# Patient Record
Sex: Female | Born: 1945
Health system: Southern US, Community
[De-identification: ages and names within clinical notes are randomized; demographics above are authoritative.]

## PROBLEM LIST (undated history)

## (undated) DIAGNOSIS — H35033 Hypertensive retinopathy, bilateral: Secondary | ICD-10-CM

## (undated) DIAGNOSIS — G8929 Other chronic pain: Secondary | ICD-10-CM

## (undated) DIAGNOSIS — I499 Cardiac arrhythmia, unspecified: Secondary | ICD-10-CM

## (undated) DIAGNOSIS — J189 Pneumonia, unspecified organism: Secondary | ICD-10-CM

## (undated) DIAGNOSIS — F329 Major depressive disorder, single episode, unspecified: Secondary | ICD-10-CM

## (undated) DIAGNOSIS — Z9889 Other specified postprocedural states: Secondary | ICD-10-CM

## (undated) DIAGNOSIS — K648 Other hemorrhoids: Secondary | ICD-10-CM

## (undated) DIAGNOSIS — K589 Irritable bowel syndrome without diarrhea: Secondary | ICD-10-CM

## (undated) DIAGNOSIS — D839 Common variable immunodeficiency, unspecified: Secondary | ICD-10-CM

## (undated) DIAGNOSIS — R112 Nausea with vomiting, unspecified: Secondary | ICD-10-CM

## (undated) DIAGNOSIS — I219 Acute myocardial infarction, unspecified: Secondary | ICD-10-CM

## (undated) DIAGNOSIS — Z85828 Personal history of other malignant neoplasm of skin: Secondary | ICD-10-CM

## (undated) DIAGNOSIS — M1990 Unspecified osteoarthritis, unspecified site: Secondary | ICD-10-CM

## (undated) DIAGNOSIS — J449 Chronic obstructive pulmonary disease, unspecified: Secondary | ICD-10-CM

## (undated) DIAGNOSIS — K219 Gastro-esophageal reflux disease without esophagitis: Secondary | ICD-10-CM

## (undated) DIAGNOSIS — I340 Nonrheumatic mitral (valve) insufficiency: Secondary | ICD-10-CM

## (undated) DIAGNOSIS — M549 Dorsalgia, unspecified: Secondary | ICD-10-CM

## (undated) DIAGNOSIS — R011 Cardiac murmur, unspecified: Secondary | ICD-10-CM

## (undated) DIAGNOSIS — D099 Carcinoma in situ, unspecified: Secondary | ICD-10-CM

## (undated) DIAGNOSIS — C4491 Basal cell carcinoma of skin, unspecified: Secondary | ICD-10-CM

## (undated) DIAGNOSIS — D049 Carcinoma in situ of skin, unspecified: Secondary | ICD-10-CM

## (undated) DIAGNOSIS — T7840XA Allergy, unspecified, initial encounter: Secondary | ICD-10-CM

## (undated) DIAGNOSIS — H269 Unspecified cataract: Secondary | ICD-10-CM

## (undated) DIAGNOSIS — C4499 Other specified malignant neoplasm of skin, unspecified: Secondary | ICD-10-CM

## (undated) DIAGNOSIS — J398 Other specified diseases of upper respiratory tract: Secondary | ICD-10-CM

## (undated) DIAGNOSIS — E785 Hyperlipidemia, unspecified: Secondary | ICD-10-CM

## (undated) DIAGNOSIS — F32A Depression, unspecified: Secondary | ICD-10-CM

## (undated) DIAGNOSIS — C4492 Squamous cell carcinoma of skin, unspecified: Secondary | ICD-10-CM

## (undated) DIAGNOSIS — Z5189 Encounter for other specified aftercare: Secondary | ICD-10-CM

## (undated) DIAGNOSIS — A439 Nocardiosis, unspecified: Secondary | ICD-10-CM

## (undated) DIAGNOSIS — G453 Amaurosis fugax: Secondary | ICD-10-CM

## (undated) DIAGNOSIS — M6530 Trigger finger, unspecified finger: Secondary | ICD-10-CM

## (undated) DIAGNOSIS — F419 Anxiety disorder, unspecified: Secondary | ICD-10-CM

## (undated) DIAGNOSIS — Z8679 Personal history of other diseases of the circulatory system: Secondary | ICD-10-CM

## (undated) DIAGNOSIS — I2510 Atherosclerotic heart disease of native coronary artery without angina pectoris: Secondary | ICD-10-CM

## (undated) DIAGNOSIS — K5730 Diverticulosis of large intestine without perforation or abscess without bleeding: Secondary | ICD-10-CM

## (undated) DIAGNOSIS — K52832 Lymphocytic colitis: Secondary | ICD-10-CM

## (undated) DIAGNOSIS — J479 Bronchiectasis, uncomplicated: Secondary | ICD-10-CM

## (undated) DIAGNOSIS — E039 Hypothyroidism, unspecified: Secondary | ICD-10-CM

## (undated) DIAGNOSIS — G4730 Sleep apnea, unspecified: Secondary | ICD-10-CM

## (undated) DIAGNOSIS — G43109 Migraine with aura, not intractable, without status migrainosus: Secondary | ICD-10-CM

## (undated) HISTORY — PX: UPPER GASTROINTESTINAL ENDOSCOPY: SHX188

## (undated) HISTORY — DX: Unspecified osteoarthritis, unspecified site: M19.90

## (undated) HISTORY — DX: Hyperlipidemia, unspecified: E78.5

## (undated) HISTORY — DX: Allergy, unspecified, initial encounter: T78.40XA

## (undated) HISTORY — DX: Unspecified cataract: H26.9

## (undated) HISTORY — DX: Personal history of other diseases of the circulatory system: Z86.79

## (undated) HISTORY — PX: BUNIONECTOMY: SHX129

## (undated) HISTORY — PX: HIP SURGERY: SHX245

## (undated) HISTORY — PX: CARDIAC CATHETERIZATION: SHX172

## (undated) HISTORY — DX: Other hemorrhoids: K64.8

## (undated) HISTORY — PX: JOINT REPLACEMENT: SHX530

## (undated) HISTORY — DX: Hypothyroidism, unspecified: E03.9

## (undated) HISTORY — DX: Gastro-esophageal reflux disease without esophagitis: K21.9

## (undated) HISTORY — DX: Migraine with aura, not intractable, without status migrainosus: G43.109

## (undated) HISTORY — DX: Diverticulosis of large intestine without perforation or abscess without bleeding: K57.30

## (undated) HISTORY — PX: TONSILLECTOMY: SUR1361

## (undated) HISTORY — DX: Bronchiectasis, uncomplicated: J47.9

## (undated) HISTORY — PX: WISDOM TOOTH EXTRACTION: SHX21

## (undated) HISTORY — DX: Depression, unspecified: F32.A

## (undated) HISTORY — DX: Trigger finger, unspecified finger: M65.30

## (undated) HISTORY — DX: Cardiac murmur, unspecified: R01.1

## (undated) HISTORY — DX: Nocardiosis, unspecified: A43.9

## (undated) HISTORY — DX: Basal cell carcinoma of skin, unspecified: C44.91

## (undated) HISTORY — DX: Nonrheumatic mitral (valve) insufficiency: I34.0

## (undated) HISTORY — DX: Chronic obstructive pulmonary disease, unspecified: J44.9

## (undated) HISTORY — DX: Other chronic pain: G89.29

## (undated) HISTORY — DX: Personal history of other malignant neoplasm of skin: Z85.828

## (undated) HISTORY — DX: Irritable bowel syndrome, unspecified: K58.9

## (undated) HISTORY — DX: Encounter for other specified aftercare: Z51.89

## (undated) HISTORY — PX: ABDOMINAL HYSTERECTOMY: SHX81

## (undated) HISTORY — PX: SPINE SURGERY: SHX786

## (undated) HISTORY — PX: CATARACT EXTRACTION: SUR2

## (undated) HISTORY — DX: Acute myocardial infarction, unspecified: I21.9

## (undated) HISTORY — DX: Hypertensive retinopathy, bilateral: H35.033

## (undated) HISTORY — DX: Lymphocytic colitis: K52.832

## (undated) HISTORY — PX: EYE SURGERY: SHX253

## (undated) HISTORY — DX: Dorsalgia, unspecified: M54.9

## (undated) HISTORY — DX: Amaurosis fugax: G45.3

## (undated) HISTORY — DX: Major depressive disorder, single episode, unspecified: F32.9

## (undated) HISTORY — PX: TUBAL LIGATION: SHX77

## (undated) HISTORY — PX: COSMETIC SURGERY: SHX468

## (undated) HISTORY — DX: Other specified diseases of upper respiratory tract: J39.8

## (undated) MED FILL — Medication: Fill #0 | Status: CN

---
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
## 1976-02-17 HISTORY — PX: TROCHANTERIC BURSA EXCISION: SHX2581

## 1977-07-17 DIAGNOSIS — C4490 Unspecified malignant neoplasm of skin, unspecified: Secondary | ICD-10-CM | POA: Insufficient documentation

## 1984-02-17 HISTORY — PX: TUBAL LIGATION: SHX77

## 1987-02-17 HISTORY — PX: NECK SURGERY: SHX720

## 1987-02-17 HISTORY — PX: CARPAL TUNNEL RELEASE: SHX101

## 1990-02-16 HISTORY — PX: CHOLECYSTECTOMY: SHX55

## 1992-05-20 DIAGNOSIS — C4491 Basal cell carcinoma of skin, unspecified: Secondary | ICD-10-CM

## 1992-05-20 HISTORY — DX: Basal cell carcinoma of skin, unspecified: C44.91

## 1994-10-07 DIAGNOSIS — D049 Carcinoma in situ of skin, unspecified: Secondary | ICD-10-CM

## 1994-10-07 HISTORY — DX: Carcinoma in situ of skin, unspecified: D04.9

## 1995-02-17 HISTORY — PX: TOTAL ABDOMINAL HYSTERECTOMY W/ BILATERAL SALPINGOOPHORECTOMY: SHX83

## 1995-02-17 LAB — HM PAP SMEAR

## 1996-02-17 DIAGNOSIS — K5730 Diverticulosis of large intestine without perforation or abscess without bleeding: Secondary | ICD-10-CM

## 1996-02-17 DIAGNOSIS — K648 Other hemorrhoids: Secondary | ICD-10-CM

## 1996-02-17 HISTORY — DX: Other hemorrhoids: K64.8

## 1996-02-17 HISTORY — PX: ABDOMINAL HYSTERECTOMY: SHX81

## 1996-02-17 HISTORY — DX: Diverticulosis of large intestine without perforation or abscess without bleeding: K57.30

## 1997-10-02 ENCOUNTER — Encounter: Admission: RE | Admit: 1997-10-02 | Discharge: 1997-12-31 | Payer: Self-pay | Admitting: Specialist

## 1998-02-16 HISTORY — PX: HIP SURGERY: SHX245

## 1998-06-03 ENCOUNTER — Ambulatory Visit (HOSPITAL_COMMUNITY): Admission: RE | Admit: 1998-06-03 | Discharge: 1998-06-03 | Payer: Self-pay | Admitting: *Deleted

## 1998-08-08 ENCOUNTER — Encounter: Payer: Self-pay | Admitting: Orthopedic Surgery

## 1998-08-08 ENCOUNTER — Ambulatory Visit (HOSPITAL_COMMUNITY): Admission: RE | Admit: 1998-08-08 | Discharge: 1998-08-08 | Payer: Self-pay | Admitting: Family Medicine

## 1998-08-08 ENCOUNTER — Encounter: Payer: Self-pay | Admitting: Family Medicine

## 1998-09-19 ENCOUNTER — Encounter: Payer: Self-pay | Admitting: Orthopedic Surgery

## 1998-09-20 ENCOUNTER — Encounter: Payer: Self-pay | Admitting: Orthopedic Surgery

## 1998-09-20 ENCOUNTER — Ambulatory Visit (HOSPITAL_COMMUNITY): Admission: RE | Admit: 1998-09-20 | Discharge: 1998-09-21 | Payer: Self-pay | Admitting: Orthopedic Surgery

## 1999-01-24 ENCOUNTER — Other Ambulatory Visit: Admission: RE | Admit: 1999-01-24 | Discharge: 1999-01-24 | Payer: Self-pay | Admitting: Obstetrics & Gynecology

## 1999-05-22 ENCOUNTER — Encounter: Admission: RE | Admit: 1999-05-22 | Discharge: 1999-05-22 | Payer: Self-pay | Admitting: *Deleted

## 1999-05-23 ENCOUNTER — Ambulatory Visit (HOSPITAL_COMMUNITY): Admission: RE | Admit: 1999-05-23 | Discharge: 1999-05-23 | Payer: Self-pay | Admitting: *Deleted

## 1999-08-07 ENCOUNTER — Ambulatory Visit (HOSPITAL_COMMUNITY): Admission: RE | Admit: 1999-08-07 | Discharge: 1999-08-07 | Payer: Self-pay | Admitting: *Deleted

## 1999-10-14 ENCOUNTER — Encounter: Admission: RE | Admit: 1999-10-14 | Discharge: 1999-10-21 | Payer: Self-pay | Admitting: Neurological Surgery

## 2000-02-16 ENCOUNTER — Other Ambulatory Visit: Admission: RE | Admit: 2000-02-16 | Discharge: 2000-02-16 | Payer: Self-pay | Admitting: Obstetrics & Gynecology

## 2000-05-24 ENCOUNTER — Ambulatory Visit (HOSPITAL_COMMUNITY): Admission: RE | Admit: 2000-05-24 | Discharge: 2000-05-24 | Payer: Self-pay | Admitting: Neurological Surgery

## 2000-05-24 ENCOUNTER — Encounter: Payer: Self-pay | Admitting: Neurological Surgery

## 2000-06-14 ENCOUNTER — Encounter: Admission: RE | Admit: 2000-06-14 | Discharge: 2000-07-07 | Payer: Self-pay | Admitting: Neurological Surgery

## 2003-03-20 HISTORY — PX: SHOULDER SURGERY: SHX246

## 2003-12-13 ENCOUNTER — Observation Stay (HOSPITAL_COMMUNITY): Admission: RE | Admit: 2003-12-13 | Discharge: 2003-12-14 | Payer: Self-pay | Admitting: Specialist

## 2004-01-01 ENCOUNTER — Encounter: Admission: RE | Admit: 2004-01-01 | Discharge: 2004-02-07 | Payer: Self-pay | Admitting: Specialist

## 2004-02-17 HISTORY — PX: COLONOSCOPY: SHX174

## 2004-04-13 ENCOUNTER — Emergency Department (HOSPITAL_COMMUNITY): Admission: EM | Admit: 2004-04-13 | Discharge: 2004-04-13 | Payer: Self-pay | Admitting: Family Medicine

## 2004-04-20 ENCOUNTER — Emergency Department (HOSPITAL_COMMUNITY): Admission: EM | Admit: 2004-04-20 | Discharge: 2004-04-20 | Payer: Self-pay | Admitting: Family Medicine

## 2004-04-22 ENCOUNTER — Emergency Department (HOSPITAL_COMMUNITY): Admission: EM | Admit: 2004-04-22 | Discharge: 2004-04-22 | Payer: Self-pay | Admitting: Family Medicine

## 2004-04-27 ENCOUNTER — Emergency Department (HOSPITAL_COMMUNITY): Admission: EM | Admit: 2004-04-27 | Discharge: 2004-04-27 | Payer: Self-pay | Admitting: Family Medicine

## 2004-11-27 ENCOUNTER — Ambulatory Visit (HOSPITAL_BASED_OUTPATIENT_CLINIC_OR_DEPARTMENT_OTHER): Admission: RE | Admit: 2004-11-27 | Discharge: 2004-11-28 | Payer: Self-pay | Admitting: Specialist

## 2004-12-16 ENCOUNTER — Encounter: Admission: RE | Admit: 2004-12-16 | Discharge: 2005-03-16 | Payer: Self-pay | Admitting: Specialist

## 2005-01-02 ENCOUNTER — Ambulatory Visit (HOSPITAL_COMMUNITY): Admission: RE | Admit: 2005-01-02 | Discharge: 2005-01-02 | Payer: Self-pay | Admitting: Orthopedic Surgery

## 2005-03-18 ENCOUNTER — Encounter: Admission: RE | Admit: 2005-03-18 | Discharge: 2005-04-15 | Payer: Self-pay | Admitting: Orthopedic Surgery

## 2005-04-02 ENCOUNTER — Ambulatory Visit: Payer: Self-pay | Admitting: Family Medicine

## 2006-02-19 ENCOUNTER — Ambulatory Visit: Payer: Self-pay | Admitting: Family Medicine

## 2006-07-16 ENCOUNTER — Ambulatory Visit: Payer: Self-pay | Admitting: Internal Medicine

## 2006-11-05 ENCOUNTER — Emergency Department (HOSPITAL_COMMUNITY): Admission: EM | Admit: 2006-11-05 | Discharge: 2006-11-05 | Payer: Self-pay | Admitting: Family Medicine

## 2006-11-30 ENCOUNTER — Ambulatory Visit: Payer: Self-pay | Admitting: Infectious Diseases

## 2006-11-30 LAB — CONVERTED CEMR LAB

## 2006-12-03 ENCOUNTER — Emergency Department (HOSPITAL_COMMUNITY): Admission: EM | Admit: 2006-12-03 | Discharge: 2006-12-03 | Payer: Self-pay | Admitting: Emergency Medicine

## 2006-12-27 ENCOUNTER — Ambulatory Visit: Payer: Self-pay | Admitting: Infectious Diseases

## 2006-12-27 LAB — CONVERTED CEMR LAB

## 2007-01-05 ENCOUNTER — Ambulatory Visit: Payer: Self-pay | Admitting: Infectious Diseases

## 2007-05-23 ENCOUNTER — Encounter: Payer: Self-pay | Admitting: Infectious Diseases

## 2007-05-25 ENCOUNTER — Ambulatory Visit: Payer: Self-pay | Admitting: Infectious Diseases

## 2007-05-25 LAB — CONVERTED CEMR LAB

## 2007-07-16 ENCOUNTER — Ambulatory Visit (HOSPITAL_COMMUNITY): Admission: RE | Admit: 2007-07-16 | Discharge: 2007-07-16 | Payer: Self-pay | Admitting: Anesthesiology

## 2007-08-05 ENCOUNTER — Ambulatory Visit (HOSPITAL_COMMUNITY): Admission: RE | Admit: 2007-08-05 | Discharge: 2007-08-05 | Payer: Self-pay | Admitting: Family Medicine

## 2007-08-10 ENCOUNTER — Emergency Department (HOSPITAL_COMMUNITY): Admission: EM | Admit: 2007-08-10 | Discharge: 2007-08-10 | Payer: Self-pay | Admitting: Family Medicine

## 2007-10-18 HISTORY — PX: INGUINAL HERNIA REPAIR: SUR1180

## 2007-11-02 ENCOUNTER — Ambulatory Visit (HOSPITAL_COMMUNITY): Admission: RE | Admit: 2007-11-02 | Discharge: 2007-11-03 | Payer: Self-pay | Admitting: *Deleted

## 2007-11-28 ENCOUNTER — Emergency Department (HOSPITAL_COMMUNITY): Admission: EM | Admit: 2007-11-28 | Discharge: 2007-11-28 | Payer: Self-pay | Admitting: Emergency Medicine

## 2008-12-13 ENCOUNTER — Ambulatory Visit: Payer: Self-pay | Admitting: Gastroenterology

## 2009-05-06 ENCOUNTER — Ambulatory Visit (HOSPITAL_COMMUNITY): Admission: RE | Admit: 2009-05-06 | Discharge: 2009-05-06 | Payer: Self-pay | Admitting: Orthopedic Surgery

## 2010-02-16 HISTORY — PX: OTHER SURGICAL HISTORY: SHX169

## 2010-02-28 ENCOUNTER — Ambulatory Visit (HOSPITAL_COMMUNITY)
Admission: RE | Admit: 2010-02-28 | Discharge: 2010-02-28 | Payer: Self-pay | Source: Home / Self Care | Attending: Anesthesiology | Admitting: Anesthesiology

## 2010-03-08 ENCOUNTER — Encounter: Payer: Self-pay | Admitting: Orthopedic Surgery

## 2010-07-01 NOTE — Op Note (Signed)
Beverly [name], PROBY             ACCOUNT NO.:  [id number]   MEDICAL RECORD NO.:  [id number]          PATIENT TYPE:  OIB   LOCATION:  1345                         FACILITY:  [facility]   PHYSICIAN:  Alfonse Ras, MD   DATE OF BIRTH:  Nov 19, 1945   DATE OF PROCEDURE:  DATE OF DISCHARGE:                               OPERATIVE REPORT   PREOPERATIVE DIAGNOSES:  Previous total abdominal hysterectomy and  bilateral inguinal hernias.   POSTOPERATIVE DIAGNOSES:  Previous total abdominal hysterectomy and  bilateral inguinal hernias.   PROCEDURE:  Attempted laparoscopic inguinal hernia repair converted to  open bilateral inguinal hernia repair with Ultrapro mesh.   SURGEON:  Alfonse Ras, MD   ASSISTANT:  Lennie Muckle, MD   ANESTHESIA:  General.   DESCRIPTION:  The patient was taken to the operating room, placed in a  supine position after adequate general anesthesia was induced using  endotracheal tube, Foley catheter was placed and the abdomen was prepped  and draped in normal sterile fashion using a transverse infraumbilical  incision, I docked it down to the fascia.  A transverse incision was  made over the right anterior rectus fascia.  The rectus muscle was  retracted laterally.  On placing the Kelly clamp and balloon, however,  because of a significant amount of scar tissue from the previous  surgery, there was a dentmade in the posterior sheath which was closed  with an 0 Vicryl purse-string suture.  However, we were again having  significant insufflation of the peritoneum and therefore I opted to  convert to open.   A oblique incision was made over the right inguinal canal and extended  down onto the external oblique fascia which was opened along its fibers  down to the external ring.  Fascial defect was identified and the floor  of Hesselbach's triangle was closed with interrupted #1 Surgilon in a  tension-free fashion approximating transversalis fascia to the  shelving  edge of Cooper's ligament and the inguinal ligament.  A piece onlay  Ultrapro mesh was placed over it and 3 x 6 with a running 2-0 Prolene  suture.  This provided an adequate repair and external oblique fascia  was closed with a running 3-0 Vicryl suture.  The skin incision was  closed with subcuticular 4-0 Monocryl.   Identical incision was made on the contralateral side, dissected down  the external oblique fascia.  This was opened along its fibers down the  external ring.  Again, there was significant amount of scar tissue.  This hernia was much smaller and was up near the internal ring.  This  was closed primarily with interrupted #1 Surgilon sutures in a tension-  free fashion and then onlay piece of Ultrapro mesh was placed over it  and tacked in place with a running 2-0  Prolene suture in identical fashion to the opposite side.  External  oblique fascia was closed with a running 3-0 Vicryl suture.  All tissues  were injected with Marcaine.  Skin incision was closed with a  subcuticular 4-0 Monocryl.  Dermabond dressing was applied.  The patient  tolerated the procedure well and went to PACU in good condition.      Alfonse Ras, MD  Electronically Signed     KRE/MEDQ  D:  11/02/2007  T:  11/03/2007  Job:  [id number]

## 2010-07-04 NOTE — Op Note (Signed)
Beverly [name], Beverly [name]             ACCOUNT NO.:  [id number]   MEDICAL RECORD NO.:  [id number]          PATIENT TYPE:  AMB   LOCATION:  DAY                          FACILITY:  [facility]   PHYSICIAN:  Jene Every, M.D.    DATE OF BIRTH:  Nov 30, 1945   DATE OF PROCEDURE:  12/13/2003  DATE OF DISCHARGE:                                 OPERATIVE REPORT   PREOPERATIVE DIAGNOSES:  1.  Impingement syndrome.  2.  Adhesive capsulitis.  3.  Possible rotator cuff tear of the left shoulder.   POSTOPERATIVE DIAGNOSES:  1.  Impingement syndrome.  2.  Adhesive capsulitis.  3.  Possible rotator cuff tear of the left shoulder.  4.  Glenoid labral tear.  5.  Degenerative joint disease of the glenohumeral joint.   PROCEDURE PERFORMED:  1.  Exam under anesthesia.  2.  Manipulation under anesthesia.  3.  Left shoulder arthroscopy.  4.  Debridement of labral tear.  5.  Subacromial decompression.  6.  Bursectomy.  7.  Acromioplasty.  8.  Debridement of partial tear of rotator cuff.   ANESTHESIA:  General.   SURGEON:  Javier Docker, M.D.   ASSISTANT:  Roma Schanz, PA-C   ANESTHESIA:  General.   BRIEF HISTORY AND INDICATIONS:  A 65 year old female with refractory  shoulder pain, having cervical pain as well.  She had responded to several  corticosteroid injections temporarily with an MRI indicating thinning of the  supraspinatus, possible small partial tears, no evidence of full-thickness  tears.  Due to the persistent symptoms and diminished range of motion, she  was indicated for exam under anesthesia, arthroscopic evaluation, possible  open rotator cuff repair, debridement, bursectomy.  Risks and benefits  discussed including bleeding, infection, damage to neurovascular structures,  suboptimal range of motion, fracture, recurrent pain, residual pain, need  for postoperative physical therapy prolonged, possible tear in the future  requiring open repair.   TECHNIQUE:  The patient  supine in beach chair position.  After the induction  of adequate general anesthesia, 1 g Kefzol, exam under anesthesia was  performed of the shoulder.  The patient actually had full range of motion of  the shoulder while under anesthesia.  There were no significant restrictions  noted.  The left shoulder, precordial region, and upper extremity was  prepped and draped in the usual sterile fashion.  Acromion, AC joint, and  coracoid were palpated and demarcated with a surgical marker.  Marcaine  0.25% with epinephrine was infiltrated in the subacromial space.  A small  incision was made over the posterolateral corner of the acromion, through  the skin in standard fashion for a posterolateral portal.  With the arm in  the 70/30 position with gentle traction applied, advanced the cannula  towards the glenohumeral joint in line with the coracoid, penetrating the  capsule atraumatically.  Small incision was then made over the anterolateral  aspect of the acromion a third of the way between that point and the  coracoid.  A blunt cannula was then introduced under direct visualization  into the glenohumeral joint just beneath the biceps tendon.  Examination  of  the joint revealed some degenerative changes of the glenoid and the humerus.  There was some tearing of the flap-type lesion without full detachment.  This was probed.  There was degenerative tearing of that.  A shaver was  introduced and utilized to perform a shaving and debridement of that labrum.  Again, it was re-palpated with no evidence of detachment.  The remainder of  the biceps tendon was unremarkable as was the subscapularis.  The exam of  the rotator cuff from its undersurface, there was some minor tearing,  articular site tearing that was probed but was not felt to be significant.  Debrided that superficially with the shaver.  Next, after a full examination  of the glenohumeral joint, we then redirected the scope into the  subacromial  space, performed a lateral incision for that portal through the skin only  and advanced the __________ cannula into the subacromial joint.  Exuberant  bursal tissue was noted.  After direct visualization, inserted the shaver  and performed a bursectomy to improve visualization.  After this bursectomy  was performed, we introduced the ArthroWand, utilized it to detach only the  CA ligament from its anterolateral detachment.  There was a small spur that  was shaved with an acromionizer.  Following this and a full bursectomy, we  examined the rotator cuff, and there was no evidence of a tear.  Fully  probed and the range and where it was noted from beneath, no evidence of a  significant tear.  Prior to this procedure, we had utilized an 18 gauge  needle to localize visually the anterolateral aspect of the acromion and the  medial aspect of the [facility] joint.   We removed then all instrumentation after the lavage.  Arthroscopic time was  35 minutes.  The pressure was 70.  There were no problems associated with  that with continuous flow throughout.   After sutures were utilized to close the portals, sterile dressing was  applied.  She was placed in a sling, extubated without difficulty,  transported to the recovery room in satisfactory condition.   The patient tolerated the procedure well with no complications.     Trey Paula   JB/MEDQ  D:  12/13/2003  T:  12/13/2003  Job:  [id number]

## 2010-07-04 NOTE — Op Note (Signed)
Beverly [name], Beverly [name]             ACCOUNT NO.:  [id number]   MEDICAL RECORD NO.:  [id number]          PATIENT TYPE:  AMB   LOCATION:  DSC                          FACILITY:  [facility]   PHYSICIAN:  Jene Every, M.D.    DATE OF BIRTH:  [birth year]   DATE OF PROCEDURE:  11/27/2004  DATE OF DISCHARGE:                                 OPERATIVE REPORT   PREOPERATIVE DIAGNOSIS:  Rotator cuff tear, left shoulder.   POSTOPERATIVE DIAGNOSIS:  Rotator cuff tear, left shoulder.   OPERATION PERFORMED:  Open rotator cuff repair, subacromial decompression,  bursectomy.   SURGEON:  Jene Every, M.D.   ASSISTANT:  Roma Schanz, P.A.   ANESTHESIA:  General.   INDICATIONS FOR PROCEDURE:  A 65 year old with history of arthroscopic  debridement on the shoulder.  MRI indicating full thickness rotator cuff  tear.  Operative intervention was indicated for open repair and examination  under anesthesia.  Risks and benefits discussed including bleeding,  infection, recurrent tears, suboptimal range of motion, adhesive capsulitis,  etc.  Anesthetic complications.   DESCRIPTION OF PROCEDURE:  Placed supine beach chair position.  After  induction of adequate general anesthesia, 1 g Kefzol, left shoulder, upper  extremity was prepped and draped in the usual sterile fashion.  Incision was  made over the anterior aspect of the acromion in Langer's lines.  Subcutaneous tissue was dissected.  Electrocautery utilized to achieve  hemostasis.  Approximately 2 cm in length.  Raphe between the anterolateral  heads was identified.  Subperiosteally elevated from the anterolateral and  anteromedial aspect of the acromion detaching the scar tissue and the some  CA ligament.  Hypertrophic bursa was excised.  No spur over the anterior  aspect of the acromion was appreciated.  Full thickness tear of the  supraspinatus tendon was identified.  Slightly retracted, approximately 1  cm.  The edge of the cuff was  debrided.  I performed a trough just medial to  the greater tuberosity with a Matt Holmes rongeur with good bleeding bone noted.  Placed two Mitek suture anchors within that trough, advanced the tendon and  secured it with a Mitek suture with excellent repair, oversewn with 0 Vicryl  simple sutures.  Full coverage, watertight coverage was appreciated.  Good  range of motion without tension or bone on the repair site.  We digitally  palpated beneath the subacromial space.  No evidence of significant  impingement from the cyst noted near the [facility] joint.  No impingement from the  [facility].  No further tears were noted.  The wound copiously irrigated.  Raphe  repaired with #1 Vicryl and interrupted figure-of-eight sutures to a good  closure, good range of motion without tension on the wound site,  subcutaneous tissue was approximated with 2-0 Vicryl simple sutures, skin  was reapproximated with 4-0 subcuticular Prolene.  Wound reinforced with  Steri-Strips.  Sterile dressing  applied.  Placed in abduction pillow, extubated without difficulty,  transported to recovery room in satisfactory condition.   The patient tolerated the procedure well without complication.      Jene Every, M.D.  Electronically Signed  JB/MEDQ  D:  11/27/2004  T:  11/27/2004  Job:  [id number]

## 2010-10-08 ENCOUNTER — Ambulatory Visit
Admission: RE | Admit: 2010-10-08 | Discharge: 2010-10-08 | Disposition: A | Discharge status: Home / Self Care | Payer: 59 | Source: Ambulatory Visit | Attending: Family Medicine | Admitting: Family Medicine

## 2010-10-08 ENCOUNTER — Ambulatory Visit (INDEPENDENT_AMBULATORY_CARE_PROVIDER_SITE_OTHER): Payer: 59 | Admitting: Family Medicine

## 2010-10-08 ENCOUNTER — Encounter: Payer: Self-pay | Admitting: Family Medicine

## 2010-10-08 VITALS — BP 148/82

## 2010-10-08 DIAGNOSIS — M25571 Pain in right ankle and joints of right foot: Secondary | ICD-10-CM

## 2010-10-08 DIAGNOSIS — M25579 Pain in unspecified ankle and joints of unspecified foot: Secondary | ICD-10-CM

## 2010-10-08 NOTE — Assessment & Plan Note (Signed)
Likely an ankle sprain. Agent though does have pain at the fibular head so we'll get x-rays to rule out fracture.  Patient has chronic pain meds we'll continue the same regimen. Patient given exercises to do at the rehabilitation. Patient can continue biking. Patient to return in 3-4 weeks for reevaluation.

## 2010-10-08 NOTE — Patient Instructions (Signed)
Nice to meet you. Continue this same pain medications you are on. I am giving you a brace to wear on her ankle. We will get x-rays. I want you to come back in 3-4 weeks for reevaluation.
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
## 2010-10-09 ENCOUNTER — Telehealth: Payer: Self-pay | Admitting: Family Medicine

## 2010-10-09 NOTE — Telephone Encounter (Signed)
I spoke with patient regarding her x-ray results. On the medial malleolus there is a question of a small chip fracture that may be acute. Her main area of tenderness yesterday was laterally but she did have bruising on the medial side as well. I talked with her today she says she does have some pain there. We have placed her in an ASO. I think given these x-ray findings I would put her in an air cast. She will come by this afternoon and have that placed. She can still use the ASO for biking but I would keep her in the air cast during all daily activities and continue her other followup plan which is return to clinic in 3 weeks. She is okay with this plan.

## 2010-10-27 LAB — HM MAMMOGRAPHY

## 2010-11-06 ENCOUNTER — Emergency Department (HOSPITAL_BASED_OUTPATIENT_CLINIC_OR_DEPARTMENT_OTHER)
Admission: EM | Admit: 2010-11-06 | Discharge: 2010-11-06 | Discharge status: Against Medical Advice | Payer: 59 | Attending: Emergency Medicine | Admitting: Emergency Medicine

## 2010-11-06 DIAGNOSIS — Z0389 Encounter for observation for other suspected diseases and conditions ruled out: Secondary | ICD-10-CM | POA: Insufficient documentation

## 2010-11-14 ENCOUNTER — Ambulatory Visit (INDEPENDENT_AMBULATORY_CARE_PROVIDER_SITE_OTHER): Payer: 59 | Admitting: Family Medicine

## 2010-11-14 ENCOUNTER — Encounter: Payer: Self-pay | Admitting: Family Medicine

## 2010-11-14 VITALS — BP 152/79 | HR 56

## 2010-11-14 DIAGNOSIS — S8253XA Displaced fracture of medial malleolus of unspecified tibia, initial encounter for closed fracture: Secondary | ICD-10-CM
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
## 2010-11-17 LAB — HEMOGLOBIN AND HEMATOCRIT, BLOOD
HCT: 39.3
Hemoglobin: 13.3

## 2010-12-19 ENCOUNTER — Encounter: Payer: Self-pay | Admitting: *Deleted

## 2011-02-20 ENCOUNTER — Ambulatory Visit (INDEPENDENT_AMBULATORY_CARE_PROVIDER_SITE_OTHER): Payer: 59 | Admitting: Medical

## 2011-02-20 ENCOUNTER — Encounter: Payer: Self-pay | Admitting: Medical

## 2011-02-20 VITALS — BP 120/70 | HR 58 | Temp 98.2°F | Resp 16 | Ht 65.0 in | Wt 159.0 lb

## 2011-02-20 DIAGNOSIS — J329 Chronic sinusitis, unspecified: Secondary | ICD-10-CM

## 2011-02-20 DIAGNOSIS — R059 Cough, unspecified: Secondary | ICD-10-CM | POA: Insufficient documentation

## 2011-02-20 DIAGNOSIS — R05 Cough: Secondary | ICD-10-CM | POA: Insufficient documentation

## 2011-02-20 MED ORDER — BENZONATATE 100 MG PO CAPS: 100.0000 mg | ORAL_CAPSULE | Freq: Two times a day (BID) | ORAL | Status: AC | PRN

## 2011-02-20 MED ORDER — DOXYCYCLINE HYCLATE 100 MG PO TABS: 100.0000 mg | ORAL_TABLET | Freq: Two times a day (BID) | ORAL | Status: AC

## 2011-02-20 NOTE — Patient Instructions (Signed)
Increase your water intake, begin salt water nasal spray and salt water gargles.  Continue Mucinex and if not improving in a few days, begin Doxycycline.

## 2011-02-20 NOTE — Progress Notes (Signed)
Subjective:  Beverly [name] is a 66 y.o. female who presents as a new patient.  She was formerly a patient of Dr. Lynelle Doctor and is looking to establish [name] here again with Dr. Lynelle Doctor.  She is here today for 5 day hx/o sinus pressure, sinus drainage, chest congestion, has had cold symptoms, but now getting frequent yellow green nasal drainage.  Using her normal medication and zyrtec.  Denies sick contacts.  No other aggravating or relieving factors.  No other c/o.  Past Medical History  Diagnosis Date  . DJD (degenerative joint disease)   . Chronic pain   . Back pain     Dr Byrd Hesselbach 02/2010-epidural injection x 2 at L4-5 with good effect  . Asthma   . GERD (gastroesophageal reflux disease)   . Allergy   . HLD (hyperlipidemia)     hypertriglyceridemia  . Osteoarthritis     feet,shoulder,neck,back,hips and hands.  . IBS (irritable bowel syndrome)   . Internal hemorrhoids 1998  . Sigmoid diverticulitis 1998    mild  . Duodenal ulcer     h/o  . Mixed basal-squamous cell carcinoma     h/o  . History of sinus bradycardia   . Mitral regurgitation     mild  . Vitamin D deficiency     mild   ROS: Gen: no fever, chills GI: no n/v/d Lungs: no sob, wheezing Heart: no chest pain, edema  Objective:   Filed Vitals:   02/20/11 1601  BP: 120/70  Pulse: 58  Temp: 98.2 F (36.8 C)  Resp: 16    General appearance: Alert, WD/WN, no distress                             Skin: warm, no rash                           Head: + mild sinus tenderness,                            Eyes: conjunctiva normal, corneas clear, PERRLA                            Ears: pearly TMs, external ear canals normal                          Nose: septum midline, turbinates swollen, with erythema and clear discharge             Mouth/throat: MMM, tongue normal, mild pharyngeal erythema                           Neck: supple, no adenopathy, no thyromegaly, nontender                          Heart: RRR, normal  S1, S2, no murmurs                         Lungs: CTA bilaterally, no wheezes, rales, or rhonchi      Assessment and Plan:   Encounter Diagnoses  Name Primary?  . Sinusitis Yes  . Cough     Prescription given for Doxycycline and Tessalon Perles.  Advised she given supportive [name] a little  more time including nasal saline, salt water gargles, Mucinex, rest, increase water intake.  If worsening in next few days, consistent purulent nasal discharge or fever, then begin Doxycyline.  Tylenol or Ibuprofen OTC for fever and malaise.  Discussed symptomatic relief, nasal saline, and call or return if worse or not improving in 2-3 days.

## 2011-04-06 ENCOUNTER — Encounter: Payer: Self-pay | Admitting: Family Medicine

## 2011-04-06 ENCOUNTER — Ambulatory Visit (INDEPENDENT_AMBULATORY_CARE_PROVIDER_SITE_OTHER): Payer: 59 | Admitting: Family Medicine

## 2011-04-06 VITALS — BP 138/78 | HR 52 | Temp 98.0°F | Ht 65.0 in | Wt 160.0 lb

## 2011-04-06 DIAGNOSIS — J45909 Unspecified asthma, uncomplicated: Secondary | ICD-10-CM | POA: Insufficient documentation

## 2011-04-06 DIAGNOSIS — E782 Mixed hyperlipidemia: Secondary | ICD-10-CM

## 2011-04-06 DIAGNOSIS — J45998 Other asthma: Secondary | ICD-10-CM

## 2011-04-06 DIAGNOSIS — K219 Gastro-esophageal reflux disease without esophagitis: Secondary | ICD-10-CM | POA: Insufficient documentation

## 2011-04-06 DIAGNOSIS — Z79899 Other long term (current) drug therapy: Secondary | ICD-10-CM

## 2011-04-06 DIAGNOSIS — Z0000 Encounter for general adult medical examination without abnormal findings: Secondary | ICD-10-CM

## 2011-04-06 DIAGNOSIS — J309 Allergic rhinitis, unspecified: Secondary | ICD-10-CM

## 2011-04-06 DIAGNOSIS — M1990 Unspecified osteoarthritis, unspecified site: Secondary | ICD-10-CM

## 2011-04-06 DIAGNOSIS — G8929 Other chronic pain: Secondary | ICD-10-CM

## 2011-04-06 DIAGNOSIS — M549 Dorsalgia, unspecified: Secondary | ICD-10-CM

## 2011-04-06 DIAGNOSIS — E559 Vitamin D deficiency, unspecified: Secondary | ICD-10-CM

## 2011-04-06 LAB — POCT URINALYSIS DIPSTICK
Glucose, UA: NEGATIVE
Spec Grav, UA: 1.015
Urobilinogen, UA: NEGATIVE

## 2011-04-06 MED ORDER — CELECOXIB 200 MG PO CAPS: 200.0000 mg | ORAL_CAPSULE | Freq: Two times a day (BID) | ORAL | Status: DC

## 2011-04-06 MED ORDER — MONTELUKAST SODIUM 10 MG PO TABS: 10.0000 mg | ORAL_TABLET | Freq: Every day | ORAL | Status: DC

## 2011-04-06 MED ORDER — ALBUTEROL SULFATE HFA 108 (90 BASE) MCG/ACT IN AERS: 2.0000 | INHALATION_SPRAY | Freq: Four times a day (QID) | RESPIRATORY_TRACT | Status: DC | PRN

## 2011-04-06 MED ORDER — BECLOMETHASONE DIPROPIONATE 80 MCG/ACT IN AERS: 1.0000 | INHALATION_SPRAY | Freq: Two times a day (BID) | RESPIRATORY_TRACT | Status: DC

## 2011-04-06 NOTE — Progress Notes (Signed)
Beverly [name] is a 66 y.o. female who presents for a complete physical.  She has the following concerns:  Coughing up some brown phlegm since last week.  Woke up with laryngitis a week ago, which has resolved, but feels like she still has "stuff coming from the right bronchus".  Using Mucinex, flonase and flovent.  Denies any shortness of breath.  Discolored mucus has been ongoing for just about a week.  Denies fevers, sinus pressure or pain, although notes some postnasal drainage.  She had a similar illness early in January, for which she took a course of doxycycline and symptoms resolved.  H/o low Vitamin D in the past.  Taking Calcium with D, but no separate D supplement recently.   Immunization History  Administered Date(s) Administered  . Hepatitis A 01/05/2007  . Pneumococcal Polysaccharide 06/17/2006  . Tdap 09/16/2004  . Zoster 04/04/2009  She gets flu shots annually at work Last Pap smear: 1997 (s/p hysterectomy) Last mammogram: 10/2010 Last colonoscopy: 7/03 vs 2006 also in chart Last DEXA: 9/04 Dentist: twice yearly Ophtho: once a year, Dr. Elmer Picker Exercise: minimal  Past Medical History  Diagnosis Date  . DJD (degenerative joint disease)   . Chronic pain   . Back pain     Dr Byrd Hesselbach 02/2010-epidural injection x 2 at L4-5 with good effect  . Asthma   . GERD (gastroesophageal reflux disease)   . Allergy   . HLD (hyperlipidemia)     hypertriglyceridemia  . Osteoarthritis     feet,shoulder,neck,back,hips and hands.  . IBS (irritable bowel syndrome)   . Internal hemorrhoids 1998  . Diverticulosis of colon 1998    mild  . Duodenal ulcer     h/o  . BCC (basal cell carcinoma of skin)     Dr. Jorja Loa  . History of sinus bradycardia   . Mitral regurgitation     mild  . Vitamin d deficiency     mild  . History of SCC (squamous cell carcinoma) of skin     Past Surgical History  Procedure Date  . Cholecystectomy 1992  . Wisdom tooth extraction   .  Tonsillectomy age 57  . Neck surgery 1989    c6-7 cervical laminectomy and diskecotmy  . Shoulder surgery 2/05    left rotator cuff repair  . Total abdominal hysterectomy w/ bilateral salpingoophorectomy 1997    fibroids  . Hip surgery     right bursectomy x 3  . Inguinal hernia repair 9/09    bilat  . Carpal tunnel release 1989    bilateral  . Epidural steroid injection, back 02/2010  . Colonoscopy 2006  . Tubal ligation 1986  . Cesarean section     x2  . Bunionectomy     R 12/08, L 2004 (Dr. Wynelle Cleveland)    History   Social History  . Marital Status: Divorced    Spouse Name: N/A    Number of Children: 2  . Years of Education: N/A   Occupational History  . STAFF NURSE    Social History Main Topics  . Smoking status: Never Smoker   . Smokeless tobacco: Never Used  . Alcohol Use: 4.2 oz/week    7 Glasses of wine per week     1 glass of wine daily.  . Drug Use: No  . Sexually Active: Not Currently   Other Topics Concern  . Not on file   Social History Narrative   Divorced, lives alone, [place]; exercises 1 day per week.  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 Depression Mother   . Schizophrenia Mother   . Cancer Father     oral  . Heart disease Father   . Cancer Sister     skin  . COPD Sister   . Hypertension Sister   . Hyperthyroidism Brother   . Hyperlipidemia Daughter   . Asthma Son   . Diabetes Maternal Grandmother   . Cancer Paternal Grandmother 25    colon cancer    Current outpatient prescriptions:acetaminophen (TYLENOL) 500 MG tablet, Take 1,000 mg by mouth as needed., Disp: , Rfl: ;  Calcium Carbonate-Vitamin D (CALCIUM 600+D) 600-200 MG-UNIT TABS, Take 1 tablet by mouth 2 (two) times daily.  , Disp: , Rfl: ;  celecoxib (CELEBREX) 200 MG capsule, Take 1 capsule (200 mg total) by mouth 2 (two) times daily., Disp: 180 capsule, Rfl: 1 cetirizine (ZYRTEC) 10 MG tablet, Take 10 mg by mouth daily.  , Disp: , Rfl:  ;  esomeprazole (NEXIUM) 40 MG capsule, Take 40 mg by mouth daily before breakfast.  , Disp: , Rfl: ;  estradiol (CLIMARA - DOSED IN MG/24 HR) 0.025 mg/24hr, Place 1 patch onto the skin once a week.  , Disp: , Rfl: ;  fentaNYL (DURAGESIC - DOSED MCG/HR) 25 MCG/HR, Place 1 patch onto the skin every 3 (three) days.  , Disp: , Rfl:  fluticasone (FLONASE) 50 MCG/ACT nasal spray, Place 2 sprays into the nose daily.  , Disp: , Rfl: ;  gabapentin (NEURONTIN) 800 MG tablet, Take 800 mg by mouth at bedtime. , Disp: , Rfl: ;  GuaiFENesin (MUCINEX PO), Take 1 tablet by mouth 2 (two) times daily., Disp: , Rfl: ;  l-methylfolate-B6-B12 (METANX) 3-35-2 MG TABS, Take 1 tablet by mouth daily. Dr.Phillips, Disp: , Rfl:  montelukast (SINGULAIR) 10 MG tablet, Take 1 tablet (10 mg total) by mouth at bedtime., Disp: 90 tablet, Rfl: 3;  Multiple Vitamins-Minerals (MULTIVITAMIN WITH MINERALS) tablet, Take 1 tablet by mouth daily., Disp: , Rfl: ;  PARoxetine (PAXIL) 20 MG tablet, Take 20 mg by mouth every morning.  , Disp: , Rfl: ;  simvastatin (ZOCOR) 20 MG tablet, Take 20 mg by mouth at bedtime.  , Disp: , Rfl:  DISCONTD: celecoxib (CELEBREX) 200 MG capsule, Take 200 mg by mouth 2 (two) times daily.  , Disp: , Rfl: ;  DISCONTD: montelukast (SINGULAIR) 10 MG tablet, Take 10 mg by mouth at bedtime.  , Disp: , Rfl: ;  albuterol (PROVENTIL HFA;VENTOLIN HFA) 108 (90 BASE) MCG/ACT inhaler, Inhale 2 puffs into the lungs every 6 (six) hours as needed for wheezing., Disp: 18 g, Rfl: 2 beclomethasone (QVAR) 80 MCG/ACT inhaler, Inhale 1 puff into the lungs 2 (two) times daily., Disp: 3 Inhaler, Rfl: 3  Allergies  Allergen Reactions  . Adhesive (Tape)   . Codeine Rash   ROS:  The patient denies anorexia, fever, weight changes, headaches,  vision changes, decreased hearing, ear pain, sore throat, breast concerns, chest pain, palpitations, dizziness, syncope, dyspnea on exertion, swelling, nausea, vomiting, diarrhea, constipation, abdominal  pain, melena, hematochezia, indigestion/heartburn, hematuria, incontinence, dysuria, vaginal bleeding, discharge, odor or itch, genital lesions, joint pains, numbness, tingling, weakness, tremor, suspicious skin lesions, depression, anxiety, abnormal bleeding/bruising, or enlarged lymph nodes. +occasional heartburn, even with daily PPI. +chronic back pain. +cough. Using aldara to hands and arms per derm with some improvement  PHYSICAL EXAM: BP 138/78  Pulse 52  Temp(Src) 98 F (36.7  C) (Oral)  Ht 5\' 5"  (1.651 m)  Wt 160 lb (72.576 kg)  BMI 26.63 kg/m2  General Appearance:    Alert, cooperative, no distress, appears stated age  Head:    Normocephalic, without obvious abnormality, atraumatic  Eyes:    PERRL, conjunctiva/corneas clear, EOM's intact, fundi    benign  Ears:    Normal TM's and external ear canals  Nose:   Nares normal, mucosa normal, no drainage or sinus   tenderness  Throat:   Lips, mucosa, and tongue normal; teeth and gums normal  Neck:   Supple, no lymphadenopathy;  thyroid:  no   enlargement/tenderness/nodules; no carotid   bruit or JVD  Back:    Spine nontender, no curvature, ROM normal, no CVA     tenderness  Lungs:     Clear to auscultation bilaterally without wheezes, rales or     ronchi; respirations unlabored  Chest Wall:    No tenderness or deformity   Heart:    Regular rate and rhythm, S1 and S2 normal, no murmur, rub   or gallop  Breast Exam:    No tenderness, masses, or nipple discharge or inversion.      No axillary lymphadenopathy  Abdomen:     Soft, non-tender, nondistended, normoactive bowel sounds,    no masses, no hepatosplenomegaly  Genitalia:    Normal external genitalia without lesions. Cyst present inside L introitus, nontender. No vaginal discharge. Uterus surgically absent.  No palpable adnexa or masses.    Rectal:    Normal tone, no masses or tenderness; guaiac negative stool  Extremities:   No clubbing, cyanosis or edema  Pulses:   2+ and  symmetric all extremities  Skin:   Skin color, texture, turgor normal, no rashes or lesions--some inflammation on chest, where using aldara  Lymph nodes:   Cervical, supraclavicular, and axillary nodes normal  Neurologic:   CNII-XII intact, normal strength, sensation and gait; reflexes 2+ and symmetric throughout          Psych:   Normal mood, affect, hygiene and grooming.     ASSESSMENT/PLAN: 1. Routine general medical examination at a health care facility  POCT Urinalysis Dipstick, Visual acuity screening, TSH  2. GERD (gastroesophageal reflux disease)    3. Asthma in remission  montelukast (SINGULAIR) 10 MG tablet, beclomethasone (QVAR) 80 MCG/ACT inhaler, albuterol (PROVENTIL HFA;VENTOLIN HFA) 108 (90 BASE) MCG/ACT inhaler  4. Allergic rhinitis, cause unspecified  montelukast (SINGULAIR) 10 MG tablet  5. Mixed hyperlipidemia  Lipid panel  6. Chronic back pain    7. OA (osteoarthritis)  celecoxib (CELEBREX) 200 MG capsule  8. Encounter for long-term (current) use of other medications  CBC with Differential, Comprehensive metabolic panel  9. Unspecified vitamin D deficiency  Vitamin D 25 hydroxy   Postmenopausal symptoms--HRT risks/benefits reviewed.  Encouraged tapering off/trial stopping GERD--Risks benefits of chronic PPI use discussed. Continue  Recommend f/u DEXA--to schedule at [facility].  Risk factors include h/o Vitamin D deficiency, and chronic PPI use.  Asthma--stable, controlled.  Discussed monthly self breast exams and yearly mammograms; at least 30 minutes of aerobic activity at least 5 days/week; proper sunscreen use reviewed; healthy diet, including goals of calcium and vitamin D intake and alcohol recommendations (less than or equal to 1 drink/day) reviewed; regular seatbelt use; changing batteries in smoke detectors.  Immunization recommendations discussed--UTD.  Colonoscopy recommendations reviewed--likely is due now or soon; pt to check with Dr. Marzetta Board office (nothing in  system)  Hemoccult kit given

## 2011-04-06 NOTE — Patient Instructions (Addendum)
HEALTH MAINTENANCE RECOMMENDATIONS:  It is recommended that you get at least 30 minutes of aerobic exercise at least 5 days/week (for weight loss, you may need as much as 60-90 minutes). This can be any activity that gets your heart rate up. This can be divided in 10-15 minute intervals if needed, but try and build up your endurance at least once a week.  Weight bearing exercise is also recommended twice weekly.  Eat a healthy diet with lots of vegetables, fruits and fiber.  "Colorful" foods have a lot of vitamins (ie green vegetables, tomatoes, red peppers, etc).  Limit sweet tea, regular sodas and alcoholic beverages, all of which has a lot of calories and sugar.  Up to 1 alcoholic drink daily may be beneficial for women (unless trying to lose weight, watch sugars).  Drink a lot of water.  Calcium recommendations are 1200-1500 mg daily (1500 mg for postmenopausal women or women without ovaries), and vitamin D 1000 IU daily.  This should be obtained from diet and/or supplements (vitamins), and calcium should not be taken all at once, but in divided doses.  Monthly self breast exams and yearly mammograms for women over the age of 27 is recommended.  Sunscreen of at least SPF 30 should be used on all sun-exposed parts of the skin when outside between the hours of 10 am and 4 pm (not just when at beach or pool, but even with exercise, golf, tennis, and yard work!)  Use a sunscreen that says "broad spectrum" so it covers both UVA and UVB rays, and make sure to reapply every 1-2 hours.  Remember to change the batteries in your smoke detectors when changing your clock times in the spring and fall.  Use your seat belt every time you are in a car, and please drive safely and not be distracted with cell phones and texting while driving.  Check with Dr. Marzetta Board office on when your next colonoscopy is due.  I recommend you get another bone density test.  You can schedule at [facility] and ask them to fax request  for order for me to sign

## 2011-04-07 LAB — COMPREHENSIVE METABOLIC PANEL
ALT: 35 U/L (ref 0–35)
AST: 43 U/L — ABNORMAL HIGH (ref 0–37)
Alkaline Phosphatase: 74 U/L (ref 39–117)
CO2: 29 mEq/L (ref 19–32)
Creat: 0.67 mg/dL (ref 0.50–1.10)
Sodium: 142 mEq/L (ref 135–145)
Total Bilirubin: 0.4 mg/dL (ref 0.3–1.2)
Total Protein: 6.6 g/dL (ref 6.0–8.3)

## 2011-04-07 LAB — CBC WITH DIFFERENTIAL/PLATELET
Basophils Absolute: 0 10*3/uL (ref 0.0–0.1)
Basophils Relative: 0 % (ref 0–1)
HCT: 42.1 % (ref 36.0–46.0)
Hemoglobin: 13.5 g/dL (ref 12.0–15.0)
Lymphocytes Relative: 42 % (ref 12–46)
MCHC: 32.1 g/dL (ref 30.0–36.0)
Monocytes Absolute: 0.6 10*3/uL (ref 0.1–1.0)
Neutro Abs: 2.5 10*3/uL (ref 1.7–7.7)
Neutrophils Relative %: 46 % (ref 43–77)
RDW: 13.6 % (ref 11.5–15.5)
WBC: 5.5 10*3/uL (ref 4.0–10.5)

## 2011-04-07 LAB — TSH: TSH: 2.461 u[IU]/mL (ref 0.350–4.500)

## 2011-04-07 LAB — LIPID PANEL
LDL Cholesterol: 94 mg/dL (ref 0–99)
Total CHOL/HDL Ratio: 4.3 Ratio
VLDL: 61 mg/dL — ABNORMAL HIGH (ref 0–40)

## 2011-04-07 LAB — VITAMIN D 25 HYDROXY (VIT D DEFICIENCY, FRACTURES): Vit D, 25-Hydroxy: 38 ng/mL (ref 30–89)

## 2011-04-08 ENCOUNTER — Other Ambulatory Visit: Payer: Self-pay | Admitting: *Deleted

## 2011-04-08 DIAGNOSIS — E782 Mixed hyperlipidemia: Secondary | ICD-10-CM

## 2011-04-08 DIAGNOSIS — Z79899 Other long term (current) drug therapy: Secondary | ICD-10-CM

## 2011-04-08 MED ORDER — SIMVASTATIN 40 MG PO TABS: 40.0000 mg | ORAL_TABLET | Freq: Every evening | ORAL | Status: DC

## 2011-04-15 ENCOUNTER — Other Ambulatory Visit (INDEPENDENT_AMBULATORY_CARE_PROVIDER_SITE_OTHER): Payer: 59

## 2011-04-15 ENCOUNTER — Encounter: Payer: Self-pay | Admitting: Family Medicine

## 2011-04-15 DIAGNOSIS — Z299 Encounter for prophylactic measures, unspecified: Secondary | ICD-10-CM

## 2011-04-15 LAB — POC HEMOCCULT BLD/STL (HOME/3-CARD/SCREEN)
Card #3 Fecal Occult Blood, POC: NEGATIVE
Fecal Occult Blood, POC: NEGATIVE

## 2011-04-21 ENCOUNTER — Telehealth: Payer: Self-pay | Admitting: Family Medicine

## 2011-04-21 DIAGNOSIS — K219 Gastro-esophageal reflux disease without esophagitis: Secondary | ICD-10-CM

## 2011-04-21 MED ORDER — ESOMEPRAZOLE MAGNESIUM 40 MG PO CPDR: 40.0000 mg | DELAYED_RELEASE_CAPSULE | Freq: Every day | ORAL | Status: DC

## 2011-04-21 NOTE — Telephone Encounter (Signed)
Done x 1 yr

## 2011-06-08 ENCOUNTER — Telehealth: Payer: Self-pay | Admitting: Internal Medicine

## 2011-06-08 DIAGNOSIS — J309 Allergic rhinitis, unspecified: Secondary | ICD-10-CM

## 2011-06-08 MED ORDER — FLUTICASONE PROPIONATE 50 MCG/ACT NA SUSP: 2.0000 | Freq: Every day | NASAL | Status: DC

## 2011-06-08 NOTE — Telephone Encounter (Signed)
done

## 2011-06-23 ENCOUNTER — Other Ambulatory Visit: Payer: Self-pay | Admitting: Family Medicine

## 2011-06-26 ENCOUNTER — Telehealth: Payer: Self-pay | Admitting: Family Medicine

## 2011-06-26 NOTE — Telephone Encounter (Signed)
Pt called and informed lab orders are in system

## 2011-06-26 NOTE — Telephone Encounter (Signed)
She already has future orders in system for LFT"s and lipids

## 2011-06-26 NOTE — Telephone Encounter (Signed)
Pt called and stated that she was to come in for labs. 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 Please call pt and inform what labs and that the orders are in the system.

## 2011-07-03 ENCOUNTER — Other Ambulatory Visit: Payer: Self-pay | Admitting: Family Medicine

## 2011-07-04 LAB — HEPATIC FUNCTION PANEL
ALT: 22 U/L (ref 0–35)
AST: 21 U/L (ref 0–37)
Bilirubin, Direct: 0.1 mg/dL (ref 0.0–0.3)
Indirect Bilirubin: 0.2 mg/dL (ref 0.0–0.9)
Total Bilirubin: 0.3 mg/dL (ref 0.3–1.2)

## 2011-07-04 LAB — LIPID PANEL
Cholesterol: 171 mg/dL (ref 0–200)
Total CHOL/HDL Ratio: 3.6 Ratio
VLDL: 53 mg/dL — ABNORMAL HIGH (ref 0–40)

## 2011-07-08 ENCOUNTER — Encounter: Payer: Self-pay | Admitting: Family Medicine

## 2011-07-08 ENCOUNTER — Ambulatory Visit (INDEPENDENT_AMBULATORY_CARE_PROVIDER_SITE_OTHER): Payer: 59 | Admitting: Family Medicine

## 2011-07-08 VITALS — BP 124/80 | HR 64 | Ht 65.5 in | Wt 160.0 lb

## 2011-07-08 DIAGNOSIS — E782 Mixed hyperlipidemia: Secondary | ICD-10-CM

## 2011-07-08 DIAGNOSIS — N959 Unspecified menopausal and perimenopausal disorder: Secondary | ICD-10-CM

## 2011-07-08 NOTE — Progress Notes (Signed)
Patient presents to f/u on lipids.  In February she was found to have elevated TG in 300 range.  We decided to start fish oil and increase simvastatin to 40mg .  Recheck shows TG are still elevated. LDL and total cholesterol are improved with higher statin dose.  HDL remained the same at 47.  She is taking 2000 mg of fish oil daily.  Diet is unchanged.  Still has 1 small glass of wine daily.  She also stopped the estrogen patch (in the past had very high TG when on Premarin; also had high TG on OCP's in past). Has only been off the patch for about 3 weeks.  Some hot flashes since stopping hormones  Past Medical History  Diagnosis Date  . DJD (degenerative joint disease)   . Chronic pain   . Back pain     Dr Byrd Hesselbach 02/2010-epidural injection x 2 at L4-5 with good effect  . Asthma   . GERD (gastroesophageal reflux disease)   . Allergy   . HLD (hyperlipidemia)     hypertriglyceridemia  . Osteoarthritis     feet,shoulder,neck,back,hips and hands.  . IBS (irritable bowel syndrome)   . Internal hemorrhoids 1998  . Diverticulosis of colon 1998    mild  . Duodenal ulcer     h/o  . BCC (basal cell carcinoma of skin)     Dr. Jorja Loa  . History of sinus bradycardia   . Mitral regurgitation     mild  . Vitamin d deficiency     mild  . History of SCC (squamous cell carcinoma) of skin    Past Surgical History  Procedure Date  . Cholecystectomy 1992  . Wisdom tooth extraction   . Tonsillectomy age 66  . Neck surgery 1989    c6-7 cervical laminectomy and diskecotmy  . Shoulder surgery 2/05    left rotator cuff repair  . Total abdominal hysterectomy w/ bilateral salpingoophorectomy 1997    fibroids  . Hip surgery     right bursectomy x 3  . Inguinal hernia repair 9/09    bilat  . Carpal tunnel release 1989    bilateral  . Epidural steroid injection, back 02/2010  . Colonoscopy 2006  . Tubal ligation 1986  . Cesarean section     x2  . Bunionectomy     R 12/08, L 2004 (Dr.  Wynelle Cleveland)   Current Outpatient Prescriptions on File Prior to Visit  Medication Sig Dispense Refill  . acetaminophen (TYLENOL) 500 MG tablet Take 1,000 mg by mouth as needed.      Marland Kitchen albuterol (PROVENTIL HFA;VENTOLIN HFA) 108 (90 BASE) MCG/ACT inhaler Inhale 2 puffs into the lungs every 6 (six) hours as needed for wheezing.  18 g  2  . beclomethasone (QVAR) 80 MCG/ACT inhaler Inhale 1 puff into the lungs 2 (two) times daily.  3 Inhaler  3  . Calcium Carbonate-Vitamin D (CALCIUM 600+D) 600-200 MG-UNIT TABS Take 1 tablet by mouth 2 (two) times daily.        . celecoxib (CELEBREX) 200 MG capsule Take 1 capsule (200 mg total) by mouth 2 (two) times daily.  180 capsule  1  . cetirizine (ZYRTEC) 10 MG tablet Take 10 mg by mouth daily.        Marland Kitchen esomeprazole (NEXIUM) 40 MG capsule Take 1 capsule (40 mg total) by mouth daily before breakfast.  90 capsule  3  . fentaNYL (DURAGESIC - DOSED MCG/HR) 25 MCG/HR Place 1 patch onto the skin every 3 (three)  days.        . fluticasone (FLONASE) 50 MCG/ACT nasal spray Place 2 sprays into the nose daily.  16 g  11  . gabapentin (NEURONTIN) 800 MG tablet Take 800 mg by mouth at bedtime.       . GuaiFENesin (MUCINEX PO) Take 1 tablet by mouth 2 (two) times daily.      Marland Kitchen l-methylfolate-B6-B12 (METANX) 3-35-2 MG TABS Take 1 tablet by mouth daily. Dr.Phillips      . montelukast (SINGULAIR) 10 MG tablet Take 1 tablet (10 mg total) by mouth at bedtime.  90 tablet  3  . Multiple Vitamins-Minerals (MULTIVITAMIN WITH MINERALS) tablet Take 1 tablet by mouth daily.      Marland Kitchen PARoxetine (PAXIL) 20 MG tablet Take 20 mg by mouth every morning.        . simvastatin (ZOCOR) 40 MG tablet TAKE 1 TABLET BY MOUTH EVERY EVENING  90 tablet  0  . estradiol (CLIMARA - DOSED IN MG/24 HR) 0.025 mg/24hr Place 1 patch onto the skin once a week.         Allergies  Allergen Reactions  . Adhesive (Tape)   . Codeine Rash   ROS:  Denies fevers, myalgias, weakness, URI symptoms. +hot  flashes  PHYSICAL EXAM: BP 124/80  Pulse 64  Ht 5' 5.5" (1.664 m)  Wt 160 lb (72.576 kg)  BMI 26.22 kg/m2 Well developed, pleasant female in no distress  Lab Results  Component Value Date   CHOL 171 07/03/2011   CHOL 202* 04/06/2011   Lab Results  Component Value Date   HDL 47 07/03/2011   HDL 47 1/61/0960   Lab Results  Component Value Date   LDLCALC 71 07/03/2011   LDLCALC 94 04/06/2011   Lab Results  Component Value Date   TRIG 267* 07/03/2011   TRIG 307* 04/06/2011   Lab Results  Component Value Date   CHOLHDL 3.6 07/03/2011   CHOLHDL 4.3 04/06/2011   No results found for this basename: LDLDIRECT   ASSESSMENT/PLAN: 1. Mixed hyperlipidemia   2. Postmenopausal symptoms    Increase fish oil to 2 capsules twice daily. Will give it more time to see if being off estrogen truly improves TG, as it seems to have for her in the past.  If TG remains >250, start low dose fenofibrate after labs in 3 months. (avoid niacin due to hot flashes already). Discussed possible risks--to call if having increasing muscle aches, weakness, or other concerns  Try Estroven if hot flashes are getting significantly worse.  Rx written for patient for LFT's and lipids in 3 months--to get done at work [facility] system, but they can't seem to release the future orders in system)

## 2011-07-08 NOTE — Patient Instructions (Signed)
Increase fish oil to 2 capsules twice daily. Will give it more time to see if being off estrogen truly improves TG, as it seems to have  in the past.  If TG remains >250, start low dose fenofibrate after labs in 3 months. (avoid niacin due to hot flashes already). Discussed possible risks--to call if having increasing muscle aches, weakness, or other concerns (after fenofibrate (IF) is started)  Try Estroven (soy and black cohosh)  if hot flashes are getting significantly worse being off hormone replacement

## 2011-09-24 ENCOUNTER — Telehealth: Payer: Self-pay | Admitting: Internal Medicine

## 2011-09-24 DIAGNOSIS — E782 Mixed hyperlipidemia: Secondary | ICD-10-CM

## 2011-09-24 MED ORDER — SIMVASTATIN 40 MG PO TABS: 40.0000 mg | ORAL_TABLET | Freq: Every day | ORAL | Status: DC

## 2011-09-24 NOTE — Telephone Encounter (Signed)
done

## 2011-10-05 ENCOUNTER — Other Ambulatory Visit: Payer: Self-pay | Admitting: Family Medicine

## 2011-10-05 LAB — HEPATIC FUNCTION PANEL
ALT: 26 U/L (ref 0–35)
Total Protein: 6.4 g/dL (ref 6.0–8.3)

## 2011-10-05 LAB — LIPID PANEL
Cholesterol: 174 mg/dL (ref 0–200)
LDL Cholesterol: 95 mg/dL (ref 0–99)
Total CHOL/HDL Ratio: 4.2 Ratio
Triglycerides: 188 mg/dL — ABNORMAL HIGH (ref <150)
VLDL: 38 mg/dL (ref 0–40)

## 2011-10-12 ENCOUNTER — Other Ambulatory Visit: Payer: Self-pay | Admitting: Family Medicine

## 2011-10-12 NOTE — Telephone Encounter (Signed)
Needs b-met.  Advise pt.  Okay for #90 with no refills (okay to refill for another 90 days in future when needed, as long as bmet was done--needs q6 months)

## 2011-10-12 NOTE — Telephone Encounter (Signed)
Is this okay to refill? 

## 2011-10-20 ENCOUNTER — Telehealth: Payer: Self-pay | Admitting: Internal Medicine

## 2011-10-20 NOTE — Telephone Encounter (Signed)
Pt is going to [place] and would like chloraquin send to [facility]

## 2011-10-20 NOTE — Telephone Encounter (Signed)
Please find out how long she will be going for (so I can determine quantity)--will send rx in by tomorrow

## 2011-10-21 MED ORDER — CHLOROQUINE PHOSPHATE 500 MG PO TABS: 500.0000 mg | ORAL_TABLET | ORAL | Status: AC

## 2011-10-21 NOTE — Telephone Encounter (Signed)
Pt is going for 9 days.

## 2011-10-21 NOTE — Telephone Encounter (Signed)
Chloroquine 500mg .  Start 1-2 weeks prior to leaving, then continue to week weekly x 4 weeks after return. #8.  Please ensure pt knows the directions. The computer has automatic directions in system to continue for 8 weeks--I can't seem to delete the pre-programmed info, and ran out of room in putting correct info, so please make sure pt knows proper directions. (She has taken before when going to [place] in past).

## 2011-10-21 NOTE — Telephone Encounter (Signed)
Pt notified and i called med in to pharmacy

## 2011-10-22 ENCOUNTER — Encounter: Payer: Self-pay | Admitting: Gastroenterology

## 2011-10-29 LAB — HM MAMMOGRAPHY: HM Mammogram: NEGATIVE

## 2011-11-02 ENCOUNTER — Other Ambulatory Visit: Payer: 59

## 2011-11-02 DIAGNOSIS — Z79899 Other long term (current) drug therapy: Secondary | ICD-10-CM

## 2011-11-02 DIAGNOSIS — E782 Mixed hyperlipidemia: Secondary | ICD-10-CM

## 2011-11-02 LAB — BASIC METABOLIC PANEL
BUN: 20 mg/dL (ref 6–23)
Calcium: 9.4 mg/dL (ref 8.4–10.5)
Glucose, Bld: 90 mg/dL (ref 70–99)
Potassium: 4.1 mEq/L (ref 3.5–5.3)
Sodium: 143 mEq/L (ref 135–145)

## 2011-11-02 NOTE — Addendum Note (Signed)
Addended by: Janeice Robinson on: 11/02/2011 10:42 AM   Modules accepted: Orders

## 2011-11-06 ENCOUNTER — Encounter: Payer: Self-pay | Admitting: Gastroenterology

## 2011-12-04 ENCOUNTER — Encounter: Payer: Self-pay | Admitting: Gastroenterology

## 2011-12-21 ENCOUNTER — Other Ambulatory Visit: Payer: Self-pay | Admitting: Family Medicine

## 2011-12-28 ENCOUNTER — Other Ambulatory Visit: Payer: Self-pay | Admitting: Family Medicine

## 2011-12-31 ENCOUNTER — Ambulatory Visit (AMBULATORY_SURGERY_CENTER): Payer: 59 | Admitting: *Deleted

## 2011-12-31 VITALS — Ht 65.0 in | Wt 160.0 lb

## 2011-12-31 DIAGNOSIS — Z1211 Encounter for screening for malignant neoplasm of colon: Secondary | ICD-10-CM

## 2011-12-31 MED ORDER — NA SULFATE-K SULFATE-MG SULF 17.5-3.13-1.6 GM/177ML PO SOLN: ORAL | Status: DC

## 2012-01-17 HISTORY — PX: COLONOSCOPY: SHX5424

## 2012-01-18 ENCOUNTER — Other Ambulatory Visit: Payer: Self-pay | Admitting: Family Medicine

## 2012-01-18 NOTE — Telephone Encounter (Signed)
Is this okay to refill? 

## 2012-01-22 ENCOUNTER — Encounter: Payer: Self-pay | Admitting: Gastroenterology

## 2012-01-22 ENCOUNTER — Ambulatory Visit (AMBULATORY_SURGERY_CENTER): Payer: 59 | Admitting: Gastroenterology

## 2012-01-22 VITALS — BP 113/56 | HR 46 | Temp 97.5°F | Resp 19 | Ht 65.0 in | Wt 160.0 lb

## 2012-01-22 DIAGNOSIS — Z1211 Encounter for screening for malignant neoplasm of colon: Secondary | ICD-10-CM

## 2012-01-22 DIAGNOSIS — K5730 Diverticulosis of large intestine without perforation or abscess without bleeding: Secondary | ICD-10-CM

## 2012-01-22 MED ORDER — SODIUM CHLORIDE 0.9 % IV SOLN: 500.0000 mL | INTRAVENOUS | Status: DC

## 2012-01-22 NOTE — Progress Notes (Signed)
Patient did not experience any of the following events: a burn prior to discharge; a fall within the facility; wrong site/side/patient/procedure/implant event; or a hospital transfer or hospital admission upon discharge from the facility. ([id number]) Patient did not have preoperative order for IV antibiotic SSI prophylaxis. ([id number])  

## 2012-01-22 NOTE — Patient Instructions (Addendum)
Findings:  Mild Diverticulosis Recommendations:  Repeat colonoscopy in 10 years.  YOU HAD AN ENDOSCOPIC PROCEDURE TODAY AT THE [facility]: Refer to the procedure report that was given to you for any specific questions about what was found during the examination.  If the procedure report does not answer your questions, please call your gastroenterologist to clarify.  If you requested that your care partner not be given the details of your procedure findings, then the procedure report has been included in a sealed envelope for you to review at your convenience later.  YOU SHOULD EXPECT: Some feelings of bloating in the abdomen. Passage of more gas than usual.  Walking can help get rid of the air that was put into your GI tract during the procedure and reduce the bloating. If you had a lower endoscopy (such as a colonoscopy or flexible sigmoidoscopy) you may notice spotting of blood in your stool or on the toilet paper. If you underwent a bowel prep for your procedure, then you may not have a normal bowel movement for a few days.  DIET: Your first meal following the procedure should be a light meal and then it is ok to progress to your normal diet.  A half-sandwich or bowl of soup is an example of a good first meal.  Heavy or fried foods are harder to digest and may make you feel nauseous or bloated.  Likewise meals heavy in dairy and vegetables can cause extra gas to form and this can also increase the bloating.  Drink plenty of fluids but you should avoid alcoholic beverages for 24 hours.  ACTIVITY: Your care partner should take you home directly after the procedure.  You should plan to take it easy, moving slowly for the rest of the day.  You can resume normal activity the day after the procedure however you should NOT DRIVE or use heavy machinery for 24 hours (because of the sedation medicines used during the test).    SYMPTOMS TO REPORT IMMEDIATELY: A gastroenterologist can be reached at  any hour.  During normal business hours, 8:30 AM to 5:00 PM Monday through Friday, call [phone].  After hours and on weekends, please call the GI answering service at [phone] who will take a message and have the physician on call contact you.   Following lower endoscopy (colonoscopy or flexible sigmoidoscopy):  Excessive amounts of blood in the stool  Significant tenderness or worsening of abdominal pains  Swelling of the abdomen that is new, acute  Fever of 100F or higher  Following upper endoscopy (EGD)  Vomiting of blood or coffee ground material  New chest pain or pain under the shoulder blades  Painful or persistently difficult swallowing  New shortness of breath  Fever of 100F or higher  Black, tarry-looking stools  FOLLOW UP: If any biopsies were taken you will be contacted by phone or by letter within the next 1-3 weeks.  Call your gastroenterologist if you have not heard about the biopsies in 3 weeks.  Our staff will call the home number listed on your records the next business day following your procedure to check on you and address any questions or concerns that you may have at that time regarding the information given to you following your procedure. This is a courtesy call and so if there is no answer at the home number and we have not heard from you through the emergency physician on call, we will assume that you have returned to  your regular daily activities without incident.  SIGNATURES/CONFIDENTIALITY: You and/or your care partner have signed paperwork which will be entered into your electronic medical record.  These signatures attest to the fact that that the information above on your After Visit Summary has been reviewed and is understood.  Full responsibility of the confidentiality of this discharge information lies with you and/or your care-partner.   Please follow all discharge instructions given to you by the recovery room nurse. If you have any  questions or problems after discharge please call one of the numbers listed above. You will receive a phone call in the am to see how you are doing and answer any questions you may have. Thank you for choosing [facility] for your health care needs.

## 2012-01-22 NOTE — Op Note (Signed)
[facility] [address] [place], [address]   COLONOSCOPY PROCEDURE REPORT  PATIENT: Beverly, [name]  MR#: [id number] BIRTHDATE: 1946/01/20 , 65  yrs. old GENDER: Female ENDOSCOPIST: Louis Meckel, MD REFERRED Edison Simon, M.D. PROCEDURE DATE:  01/22/2012 PROCEDURE:   Colonoscopy, diagnostic ASA CLASS:   Class II INDICATIONS: MEDICATIONS: MAC sedation, administered by CRNA and propofol (Diprivan) 150mg  IV  DESCRIPTION OF PROCEDURE:   After the risks benefits and alternatives of the procedure were thoroughly explained, informed consent was obtained.  A digital rectal exam revealed no abnormalities of the rectum.   The LB [id number]-[id number]  endoscope was introduced through the anus and advanced to the   . No adverse events experienced.   The quality of the prep was Suprep good  The instrument was then slowly withdrawn as the colon was fully examined.      COLON FINDINGS: Mild diverticulosis was noted in the sigmoid colon. The colon mucosa was otherwise normal.  Retroflexed views revealed no abnormalities. The time to cecum=5 minutes 46 seconds. Withdrawal time=6 minutes 15 seconds.  The scope was withdrawn and the procedure completed. COMPLICATIONS: There were no complications.  ENDOSCOPIC IMPRESSION: 1.   Mild diverticulosis was noted in the sigmoid colon 2.   The colon mucosa was otherwise normal  RECOMMENDATIONS: Continue current colorectal screening recommendations for "routine risk" patients with a repeat colonoscopy in 10 years.   eSigned:  Louis Meckel, MD 01/22/2012 9:22 AM   cc:

## 2012-01-22 NOTE — Progress Notes (Signed)
[id number] Assisted to Bathroom. Gait Steady.

## 2012-01-25 ENCOUNTER — Telehealth: Payer: Self-pay | Admitting: *Deleted

## 2012-01-25 NOTE — Telephone Encounter (Signed)
  Follow up Call-  Call back number 01/22/2012  Post procedure Call Back phone  # [phone]  Permission to leave phone message No     Patient questions:  Do you have a fever, pain , or abdominal swelling? no Pain Score  0 *  Have you tolerated food without any problems? yes  Have you been able to return to your normal activities? yes  Do you have any questions about your discharge instructions: Diet   no Medications  no Follow up visit  no  Do you have questions or concerns about your Care? no  Actions: * If pain score is 4 or above: No action needed, pain <4.

## 2012-02-01 ENCOUNTER — Encounter: Payer: Self-pay | Admitting: Family Medicine

## 2012-04-01 ENCOUNTER — Other Ambulatory Visit: Payer: Self-pay

## 2012-04-01 DIAGNOSIS — J309 Allergic rhinitis, unspecified: Secondary | ICD-10-CM

## 2012-04-01 DIAGNOSIS — J45998 Other asthma: Secondary | ICD-10-CM

## 2012-04-01 MED ORDER — MONTELUKAST SODIUM 10 MG PO TABS: 10.0000 mg | ORAL_TABLET | Freq: Every day | ORAL | Status: DC

## 2012-04-01 NOTE — Telephone Encounter (Signed)
SENT IN [facility]

## 2012-04-13 ENCOUNTER — Telehealth: Payer: Self-pay | Admitting: *Deleted

## 2012-04-13 NOTE — Telephone Encounter (Signed)
Left message informing patient.

## 2012-04-13 NOTE — Telephone Encounter (Signed)
Since her TG was improved with diet and fish oil (and no rx meds), the plan was to repeat at next routine visit.  Did not need a 6 month appt.  Plan for May

## 2012-04-13 NOTE — Telephone Encounter (Signed)
Patient called and was unsure of when she needed to follow up. She was last seen in May 2013, and had labs 10/2011. Should she have been a 6 month follow up? Or was she okay for a year? I will call her back and schedule appointment for whichever, thanks.

## 2012-04-15 ENCOUNTER — Other Ambulatory Visit: Payer: Self-pay | Admitting: Family Medicine

## 2012-04-15 ENCOUNTER — Ambulatory Visit (INDEPENDENT_AMBULATORY_CARE_PROVIDER_SITE_OTHER): Payer: 59 | Admitting: *Deleted

## 2012-04-15 DIAGNOSIS — Z0000 Encounter for general adult medical examination without abnormal findings: Secondary | ICD-10-CM

## 2012-04-15 DIAGNOSIS — Z23 Encounter for immunization: Secondary | ICD-10-CM

## 2012-04-15 MED ORDER — POLIOVIRUS VACCINE INACTIVATED IJ INJ
0.5000 mL | INJECTION | Freq: Once | INTRAMUSCULAR | Status: AC
  Administered 2012-04-15: 0.5 mL via SUBCUTANEOUS

## 2012-04-18 ENCOUNTER — Other Ambulatory Visit: Payer: Self-pay | Admitting: Family Medicine

## 2012-04-18 ENCOUNTER — Other Ambulatory Visit: Payer: Self-pay | Admitting: *Deleted

## 2012-04-18 DIAGNOSIS — K219 Gastro-esophageal reflux disease without esophagitis: Secondary | ICD-10-CM

## 2012-04-18 MED ORDER — ESOMEPRAZOLE MAGNESIUM 40 MG PO CPDR: 40.0000 mg | DELAYED_RELEASE_CAPSULE | Freq: Every day | ORAL | Status: DC

## 2012-04-18 NOTE — Telephone Encounter (Signed)
Okay to refill.  She is due for c-met (needs q6 mos due to chronic use of med).  Please enter future order.  Can do other labs with her visit (schedule fasting visit)

## 2012-04-18 NOTE — Telephone Encounter (Signed)
Is this okay to refill? She is due for appt in May, left her a message to schedule appointment.

## 2012-04-18 NOTE — Telephone Encounter (Signed)
Refill request for nexium 40mg  to [facility]

## 2012-04-18 NOTE — Telephone Encounter (Signed)
Done

## 2012-04-20 ENCOUNTER — Telehealth: Payer: Self-pay | Admitting: Internal Medicine

## 2012-04-20 ENCOUNTER — Ambulatory Visit (INDEPENDENT_AMBULATORY_CARE_PROVIDER_SITE_OTHER): Payer: 59 | Admitting: Physician Assistant

## 2012-04-20 ENCOUNTER — Encounter: Payer: Self-pay | Admitting: Physician Assistant

## 2012-04-20 ENCOUNTER — Other Ambulatory Visit: Payer: Self-pay | Admitting: *Deleted

## 2012-04-20 VITALS — BP 118/72 | HR 79 | Temp 100.8°F | Resp 16 | Ht 64.75 in | Wt 158.8 lb

## 2012-04-20 DIAGNOSIS — Z79899 Other long term (current) drug therapy: Secondary | ICD-10-CM

## 2012-04-20 DIAGNOSIS — J45998 Other asthma: Secondary | ICD-10-CM

## 2012-04-20 DIAGNOSIS — R05 Cough: Secondary | ICD-10-CM

## 2012-04-20 DIAGNOSIS — R059 Cough, unspecified: Secondary | ICD-10-CM

## 2012-04-20 DIAGNOSIS — J019 Acute sinusitis, unspecified: Secondary | ICD-10-CM

## 2012-04-20 MED ORDER — IPRATROPIUM BROMIDE 0.02 % IN SOLN
0.5000 mg | Freq: Once | RESPIRATORY_TRACT | Status: AC
  Administered 2012-04-20: 0.5 mg via RESPIRATORY_TRACT

## 2012-04-20 MED ORDER — ALBUTEROL SULFATE (2.5 MG/3ML) 0.083% IN NEBU
5.0000 mg | INHALATION_SOLUTION | Freq: Once | RESPIRATORY_TRACT | Status: AC
  Administered 2012-04-20: 5 mg via RESPIRATORY_TRACT

## 2012-04-20 MED ORDER — ALBUTEROL SULFATE HFA 108 (90 BASE) MCG/ACT IN AERS: 2.0000 | INHALATION_SPRAY | Freq: Four times a day (QID) | RESPIRATORY_TRACT | Status: DC | PRN

## 2012-04-20 MED ORDER — PREDNISONE 20 MG PO TABS: ORAL_TABLET | ORAL | Status: DC

## 2012-04-20 MED ORDER — BENZONATATE 100 MG PO CAPS: 100.0000 mg | ORAL_CAPSULE | Freq: Three times a day (TID) | ORAL | Status: DC | PRN

## 2012-04-20 MED ORDER — DOXYCYCLINE HYCLATE 100 MG PO CAPS: 100.0000 mg | ORAL_CAPSULE | Freq: Two times a day (BID) | ORAL | Status: DC

## 2012-04-20 MED ORDER — IPRATROPIUM BROMIDE 0.03 % NA SOLN: 2.0000 | Freq: Two times a day (BID) | NASAL | Status: DC

## 2012-04-20 MED ORDER — GUAIFENESIN ER 1200 MG PO TB12: 1.0000 | ORAL_TABLET | Freq: Two times a day (BID) | ORAL | Status: DC | PRN

## 2012-04-20 NOTE — Telephone Encounter (Signed)
Done. .

## 2012-04-20 NOTE — Telephone Encounter (Signed)
Quantity requested isn't correct.  Okay to refill x 1

## 2012-04-20 NOTE — Telephone Encounter (Signed)
Is this okay to refill? I have left her a message to return my call to get fasting med check scheduled for May.

## 2012-04-20 NOTE — Progress Notes (Signed)
Subjective:    Patient ID: Beverly [name], female    DOB: 19-Dec-1945, 67 y.o.   MRN: [id number]  HPI  A 67 year old female presents with nasal congestion and productive cough for 5 days.    Pt has history of allergies and sinus problems.  Today she started having a sore throat, left otalgia, and fever Tmax 100.8.  Admits to post nasal drip and HA.  Denies recent illnesses.  Pt has tried Mucinex, Singulair, Flonase, Zyrtec, QVAR, and Albuterol inhaler.  Pt is coughing a yellow/brown mucus.  She started to have wheezing on Saturday which forced her to use her asthma inhalers.  She has a hx of asthma and has not had to use her inhalers in the past 2 years.  Admits to CP and SOB with coughing spells.      Pt states Azithromycin has not worked for her in the past with sinusitis but Doxycycline has.  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       Past Medical History  Diagnosis Date  . DJD (degenerative joint disease)   . Chronic pain   . Back pain     Dr Byrd Hesselbach 02/2010-epidural injection x 2 at L4-5 with good effect  . Asthma   . GERD (gastroesophageal reflux disease)   . Allergy   . HLD (hyperlipidemia)     hypertriglyceridemia  . Osteoarthritis     feet,shoulder,neck,back,hips and hands.  . IBS (irritable bowel syndrome)   . Internal hemorrhoids 1998  . Diverticulosis of colon 1998    mild  . Duodenal ulcer     h/o  . BCC (basal cell carcinoma of skin)     Dr. Jorja Loa  . History of sinus bradycardia   . Mitral regurgitation     mild  . Vitamin D deficiency     mild  . History of SCC (squamous cell carcinoma) of skin   . Cataract     left  . Depression     Past Surgical History  Procedure Laterality Date  . Cholecystectomy  1992  . Wisdom tooth extraction    . Tonsillectomy  age 13  . Neck surgery  1989    c6-7 cervical laminectomy and diskecotmy  . Shoulder surgery  2/05    left rotator cuff repair  . Total abdominal hysterectomy w/ bilateral  salpingoophorectomy  1997    fibroids  . Hip surgery      right bursectomy x 3  . Inguinal hernia repair  9/09    bilat  . Carpal tunnel release  1989    bilateral  . Epidural steroid injection, back  02/2010  . Colonoscopy  2006  . Tubal ligation  1986  . Cesarean section      x2  . Bunionectomy      R 12/08, L 2004 (Dr. Wynelle Cleveland)  . Colonoscopy  01/2012    due again 01/2022; mild diverticulosis    Prior to Admission medications   Medication Sig Start Date End Date Taking? Authorizing Provider  acetaminophen (TYLENOL) 500 MG tablet Take 1,000 mg by mouth as needed.   Yes Historical Provider, MD  albuterol (PROVENTIL HFA;VENTOLIN HFA) 108 (90 BASE) MCG/ACT inhaler Inhale 2 puffs into the lungs every 6 (six) hours as needed for wheezing. 04/20/12 04/20/13 Yes Joselyn Arrow, MD  Calcium Carbonate-Vitamin D (CALCIUM 600+D) 600-200 MG-UNIT TABS Take 1 tablet by mouth 2 (two) times daily.     Yes Historical Provider, MD  CELEBREX 200 MG  capsule TAKE 1 CAPSULE BY MOUTH TWICE DAILY 04/18/12  Yes Joselyn Arrow, MD  cetirizine (ZYRTEC) 10 MG tablet Take 10 mg by mouth daily.     Yes Historical Provider, MD  esomeprazole (NEXIUM) 40 MG capsule Take 1 capsule (40 mg total) by mouth daily before breakfast. 04/18/12  Yes Joselyn Arrow, MD  fentaNYL (DURAGESIC - DOSED MCG/HR) 25 MCG/HR Place 1 patch onto the skin every 3 (three) days.     Yes Historical Provider, MD  fish oil-omega-3 fatty acids 1000 MG capsule Take 2 g by mouth 2 (two) times daily.   Yes Historical Provider, MD  fluticasone (FLONASE) 50 MCG/ACT nasal spray Place 2 sprays into the nose daily. 06/08/11  Yes Joselyn Arrow, MD  gabapentin (NEURONTIN) 800 MG tablet Take 800 mg by mouth at bedtime.    Yes Historical Provider, MD  GuaiFENesin (MUCINEX PO) Take 1 tablet by mouth 2 (two) times daily.   Yes Historical Provider, MD  HYDROcodone-acetaminophen (NORCO/VICODIN) 5-325 MG per tablet Take 1 tablet by mouth every 6 (six) hours as needed. pain   Yes  Historical Provider, MD  l-methylfolate-B6-B12 (METANX) 3-35-2 MG TABS Take 1 tablet by mouth daily. Dr.Phillips   Yes Historical Provider, MD  montelukast (SINGULAIR) 10 MG tablet Take 1 tablet (10 mg total) by mouth at bedtime. 04/01/12  Yes Joselyn Arrow, MD  Multiple Vitamins-Minerals (MULTIVITAMIN WITH MINERALS) tablet Take 1 tablet by mouth daily.   Yes Historical Provider, MD  PARoxetine (PAXIL) 20 MG tablet Take 20 mg by mouth every morning.     Yes Historical Provider, MD  simvastatin (ZOCOR) 40 MG tablet TAKE 1 TABLET BY MOUTH AT BEDTIME 12/21/11  Yes Ronnald Nian, MD  beclomethasone (QVAR) 80 MCG/ACT inhaler Inhale 1 puff into the lungs 2 (two) times daily. 04/06/11 04/05/12  Joselyn Arrow, MD  estradiol (CLIMARA - DOSED IN MG/24 HR) 0.025 mg/24hr Place 1 patch onto the skin once a week.      Historical Provider, MD  montelukast (SINGULAIR) 10 MG tablet TAKE 1 TABLET BY MOUTH AT BEDTIME. 04/15/12   Joselyn Arrow, MD    Allergies  Allergen Reactions  . Adhesive (Tape) Rash  . Codeine Rash    History   Social History  . Marital Status: Divorced    Spouse Name: N/A    Number of Children: 2  . Years of Education: N/A   Occupational History  . STAFF NURSE    Social History Main Topics  . Smoking status: Never Smoker   . Smokeless tobacco: Never Used  . Alcohol Use: 4.2 oz/week    7 Glasses of wine per week     Comment: 1 glass of wine daily.  . Drug Use: No  . Sexually Active: Not Currently   Other Topics Concern  . Not on file   Social History Narrative   Divorced, lives alone, [place]; exercises 1 day per week.  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 Depression Mother   . Schizophrenia Mother   . Cancer Father     oral  . Heart disease Father   . Cancer Sister     skin  . COPD Sister   . Hypertension Sister   . Hyperthyroidism Brother   . Hyperlipidemia Daughter   . Asthma Son   . Diabetes Maternal Grandmother   . Cancer  Paternal Grandmother 48    colon cancer  . Colon cancer Paternal Grandmother  Review of Systems   As above. Objective:   Physical Exam  BP 118/72  Pulse 79  Temp(Src) 100.8 F (38.2 C) (Oral)  Resp 16  Ht 5' 4.75" (1.645 m)  Wt 158 lb 12.8 oz (72.031 kg)  BMI 26.62 kg/m2  SpO2 97%  PF 380 L/min  General:  Pleasant, overweight female.  NAD. HEENT:  Injected left TM.  Edema of turbinates.  Mild erythema of pharynx.  Positive heterochromia iridum.  Positive nasal congestion. Heart:  RRR.  Normal S1,S2.  No m/g/r. Lungs:  CTAB.  No wheezing.  Positive course rhonchi with cough. Neck:  Supple.  No lymphadenopathy. Neuro:  A&Ox3.  Cranial nerves intact. Psych:  Normal mood and affect.      Assessment & Plan:  Cough - Plan: albuterol (PROVENTIL) (2.5 MG/3ML) 0.083% nebulizer solution 5 mg, ipratropium (ATROVENT) nebulizer solution 0.5 mg  Sinusitis, acute  Patient Instructions  Complete antibiotic.  Drink plenty of water to hydrate.  Use your albuterol inhaler and home medications as needed.  Continue your Mucinex.  Fill the prescription for prednisone if your symptoms haven't begun to improve in the next 48 hours, sooner if needed.

## 2012-04-20 NOTE — Patient Instructions (Addendum)
Complete antibiotic.  Drink plenty of water to hydrate.  Use your albuterol inhaler and home medications as needed.  Continue your Mucinex.  Fill the prescription for prednisone if your symptoms haven't begun to improve in the next 48 hours, sooner if needed.

## 2012-04-22 NOTE — Progress Notes (Signed)
I have examined this patient along with the student and agree. She reported symptom improvement after neb treatment with albuterol + Atrovent. Meds ordered this encounter  Medications  . albuterol (PROVENTIL) (2.5 MG/3ML) 0.083% nebulizer solution 5 mg    Sig:   . ipratropium (ATROVENT) nebulizer solution 0.5 mg    Sig:   . benzonatate (TESSALON) 100 MG capsule    Sig: Take 1-2 capsules (100-200 mg total) by mouth 3 (three) times daily as needed for cough.    Dispense:  40 capsule    Refill:  0    Order Specific Question:  Supervising Provider    Answer:  DOOLITTLE, ROBERT P [[id number]]  . ipratropium (ATROVENT) 0.03 % nasal spray    Sig: Place 2 sprays into the nose 2 (two) times daily.    Dispense:  30 mL    Refill:  0    Order Specific Question:  Supervising Provider    Answer:  DOOLITTLE, ROBERT P [[id number]]  . Guaifenesin (MUCINEX MAXIMUM STRENGTH) 1200 MG TB12    Sig: Take 1 tablet (1,200 mg total) by mouth every 12 (twelve) hours as needed.    Dispense:  14 tablet    Refill:  1    Order Specific Question:  Supervising Provider    Answer:  DOOLITTLE, ROBERT P [[id number]]  . doxycycline (VIBRAMYCIN) 100 MG capsule    Sig: Take 1 capsule (100 mg total) by mouth 2 (two) times daily.    Dispense:  20 capsule    Refill:  0    Order Specific Question:  Supervising Provider    Answer:  DOOLITTLE, ROBERT P [[id number]]  . predniSONE (DELTASONE) 20 MG tablet    Sig: Take 3 PO QAM x3days, 2 PO QAM x3days, 1 PO QAM x3days    Dispense:  18 tablet    Refill:  0    Order Specific Question:  Supervising Provider    Answer:  DOOLITTLE, ROBERT P [[id number]]   Fernande Bras, PA-C Certified Physician Assistant [facility]

## 2012-04-25 ENCOUNTER — Telehealth: Payer: Self-pay | Admitting: Family Medicine

## 2012-04-25 ENCOUNTER — Encounter: Payer: Self-pay | Admitting: Family Medicine

## 2012-04-25 ENCOUNTER — Ambulatory Visit (INDEPENDENT_AMBULATORY_CARE_PROVIDER_SITE_OTHER): Payer: 59 | Admitting: Family Medicine

## 2012-04-25 VITALS — BP 140/86 | HR 68 | Temp 98.1°F | Ht 65.5 in | Wt 150.0 lb

## 2012-04-25 DIAGNOSIS — J329 Chronic sinusitis, unspecified: Secondary | ICD-10-CM

## 2012-04-25 DIAGNOSIS — R05 Cough: Secondary | ICD-10-CM

## 2012-04-25 DIAGNOSIS — J45901 Unspecified asthma with (acute) exacerbation: Secondary | ICD-10-CM | POA: Insufficient documentation

## 2012-04-25 DIAGNOSIS — J209 Acute bronchitis, unspecified: Secondary | ICD-10-CM | POA: Insufficient documentation

## 2012-04-25 DIAGNOSIS — R059 Cough, unspecified: Secondary | ICD-10-CM

## 2012-04-25 DIAGNOSIS — Z79899 Other long term (current) drug therapy: Secondary | ICD-10-CM

## 2012-04-25 MED ORDER — PREDNISONE 20 MG PO TABS: ORAL_TABLET | ORAL | Status: DC

## 2012-04-25 MED ORDER — TEMAZEPAM 15 MG PO CAPS: 15.0000 mg | ORAL_CAPSULE | Freq: Every evening | ORAL | Status: DC | PRN

## 2012-04-25 NOTE — Telephone Encounter (Signed)
The medication is temazepam 15 mg and she takes 1 at bedtime   [facility]

## 2012-04-25 NOTE — Telephone Encounter (Signed)
Please call in #5 tablets

## 2012-04-25 NOTE — Telephone Encounter (Signed)
Phoned in #5 temazepam 15mg  to [facility].

## 2012-04-25 NOTE — Progress Notes (Signed)
Chief Complaint  Patient presents with  . Cough    seen last Wed at urgent care, still coughing. Cough is productive.    She was seen at Urgent Care 3/5 with bronchitis--had fever and cough.  Treated with albuterol and atrovent nebs at UC--felt so shaky after that she didn't even feel safe to drive.  She was rx'd doxycycline and tessalon, and taking mucinex.  She was prescribed prednisone, but told not to fill that right away, only if needed, and hasn't filled that yet.  She presents today for follow up.  The fevers have resolved.  She had bloody phlegm after her breathing treatments, which lasted x 2 days.  She has lots of clear nasal drainage, only occasionally is slightly bloody.  Also having postnasal drainage.  Sinus pain has resolved.  She never filled the atrovent nasal spray that was prescribed in UC.  Noted a little bit of blood in her phlegm last night.  Phlegm overall is getting much clearer, sometimes with hard cough notices some "brown".  She has lost 8 pounds in the last week.  Worried about vomiting back up her food (and meds) with coughing spells, so has mainly only been eating soups and yogurt (soft/clear foods).  Denies shortnes of breath.  Some pain with coughing, but no pleuritic chest pain. Using Flovent, and has been using the proventil three times daily--not helping as much as she would have hoped. Benadryl wires her.  She is having trouble sleeping at night due to cough.  She takes vicodin at bedtime chronically for pain.  Needed leftover temazepam from [place] trip in order to get any sleep the last few nights.  She is out and is requesting refill.  Past Medical History  Diagnosis Date  . DJD (degenerative joint disease)   . Chronic pain   . Back pain     Dr Byrd Hesselbach 02/2010-epidural injection x 2 at L4-5 with good effect  . Asthma   . GERD (gastroesophageal reflux disease)   . Allergy   . HLD (hyperlipidemia)     hypertriglyceridemia  . Osteoarthritis    feet,shoulder,neck,back,hips and hands.  . IBS (irritable bowel syndrome)   . Internal hemorrhoids 1998  . Diverticulosis of colon 1998    mild  . Duodenal ulcer     h/o  . BCC (basal cell carcinoma of skin)     Dr. Jorja Loa  . History of sinus bradycardia   . Mitral regurgitation     mild  . Vitamin D deficiency     mild  . History of SCC (squamous cell carcinoma) of skin   . Cataract     left  . Depression    Past Surgical History  Procedure Laterality Date  . Cholecystectomy  1992  . Wisdom tooth extraction    . Tonsillectomy  age 74  . Neck surgery  1989    c6-7 cervical laminectomy and diskecotmy  . Shoulder surgery  2/05    left rotator cuff repair  . Total abdominal hysterectomy w/ bilateral salpingoophorectomy  1997    fibroids  . Hip surgery      right bursectomy x 3  . Inguinal hernia repair  9/09    bilat  . Carpal tunnel release  1989    bilateral  . Epidural steroid injection, back  02/2010  . Colonoscopy  2006  . Tubal ligation  1986  . Cesarean section      x2  . Bunionectomy      R 12/08,  L 2004 (Dr. Wynelle Cleveland)  . Colonoscopy  01/2012    due again 01/2022; mild diverticulosis   History   Social History  . Marital Status: Divorced    Spouse Name: N/A    Number of Children: 2  . Years of Education: N/A   Occupational History  . STAFF NURSE    Social History Main Topics  . Smoking status: Never Smoker   . Smokeless tobacco: Never Used  . Alcohol Use: 4.2 oz/week    7 Glasses of wine per week     Comment: 1 glass of wine daily.  . Drug Use: No  . Sexually Active: Not Currently   Other Topics Concern  . Not on file   Social History Narrative   Divorced, lives alone, [place]; exercises 1 day per week.  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 acetaminophen (TYLENOL) 500 MG tablet Take 1,000 mg by mouth as needed.      Marland Kitchen albuterol (PROVENTIL HFA;VENTOLIN HFA)  108 (90 BASE) MCG/ACT inhaler Inhale 2 puffs into the lungs every 6 (six) hours as needed for wheezing.  18 g  0  . beclomethasone (QVAR) 80 MCG/ACT inhaler Inhale 1 puff into the lungs 2 (two) times daily.  3 Inhaler  3  . benzonatate (TESSALON) 100 MG capsule Take 1-2 capsules (100-200 mg total) by mouth 3 (three) times daily as needed for cough.  40 capsule  0  . CELEBREX 200 MG capsule TAKE 1 CAPSULE BY MOUTH TWICE DAILY  180 capsule  0  . cetirizine (ZYRTEC) 10 MG tablet Take 10 mg by mouth daily.        Marland Kitchen doxycycline (VIBRAMYCIN) 100 MG capsule Take 1 capsule (100 mg total) by mouth 2 (two) times daily.  20 capsule  0  . esomeprazole (NEXIUM) 40 MG capsule Take 1 capsule (40 mg total) by mouth daily before breakfast.  90 capsule  0  . fentaNYL (DURAGESIC - DOSED MCG/HR) 25 MCG/HR Place 1 patch onto the skin every 3 (three) days.        . fluticasone (FLONASE) 50 MCG/ACT nasal spray Place 2 sprays into the nose daily.  16 g  11  . gabapentin (NEURONTIN) 800 MG tablet Take 800 mg by mouth at bedtime.       . Guaifenesin (MUCINEX MAXIMUM STRENGTH) 1200 MG TB12 Take 1 tablet (1,200 mg total) by mouth every 12 (twelve) hours as needed.  14 tablet  1  . ipratropium (ATROVENT) 0.03 % nasal spray Place 2 sprays into the nose 2 (two) times daily.  30 mL  0  . l-methylfolate-B6-B12 (METANX) 3-35-2 MG TABS Take 1 tablet by mouth daily. Dr.Phillips      . montelukast (SINGULAIR) 10 MG tablet Take 1 tablet (10 mg total) by mouth at bedtime.  90 tablet  0  . Multiple Vitamins-Minerals (MULTIVITAMIN WITH MINERALS) tablet Take 1 tablet by mouth daily.      Marland Kitchen PARoxetine (PAXIL) 20 MG tablet Take 20 mg by mouth every morning.        . simvastatin (ZOCOR) 40 MG tablet TAKE 1 TABLET BY MOUTH AT BEDTIME  90 tablet  0  . Calcium Carbonate-Vitamin D (CALCIUM 600+D) 600-200 MG-UNIT TABS Take 1 tablet by mouth 2 (two) times daily.        . fish oil-omega-3 fatty acids 1000 MG capsule Take 2 g by mouth 2 (two) times  daily.      Marland Kitchen  HYDROcodone-acetaminophen (NORCO/VICODIN) 5-325 MG per tablet Take 1 tablet by mouth every 6 (six) hours as needed. pain       No current facility-administered medications on file prior to visit.   Allergies  Allergen Reactions  . Adhesive (Tape) Rash  . Codeine Rash   ROS:  Denies recent fevers, headaches, dizziness.  +runny nose/PND.  No sinus pain, sore throat. + cough. No nausea, vomiting, diarrhea.  +decreased appetite and weight loss.  Denies skin rash, shortness of breath.  +chest pain from coughing.  PHYSICAL EXAM: BP 140/86  Pulse 68  Temp(Src) 98.1 F (36.7 C) (Oral)  Ht 5' 5.5" (1.664 m)  Wt 150 lb (68.04 kg)  BMI 24.57 kg/m2 Mildly ill-appearing female, with frequent dry cough, easily speaking in full sentences.  Appears tired. HEENT:  PERRL, conjunctiva clear.  TM's and EAC's normal.  Nasal mucosa mildly edematous with clear mucus.  OP clear.  Sinuses nontender Heart: regular rate and rhythm without murmur Lungs: coarse bilaterally, on expiration.  Good air movement.  Fighting back dry cough with every exhalation.  Some mild wheezes noted with expiration (but almost holding her breath to prevent cough, while exhaling).  No rales Extremities: no edema Skin: no rash  ASSESSMENT/PLAN:  Cough - Plan: predniSONE (DELTASONE) 20 MG tablet  Unspecified asthma, with exacerbation  Acute bronchitis  Sinusitis - resolving  Encounter for long-term (current) use of other medications - Plan: Comprehensive metabolic panel  Bronchitis/sinusitis--resolving with antibiotics.  Ongoing RAD/asthma which is contributing to cough.  No evidence of pneumonia on exam. Complete the course of antibiotics Start prednisone--can do as prescribed, or can take 60 mg just for 2 days, then down to 40mg  for three days, then 20mg  for three days, then 1/2 tablet for 3-4 days, OR you can take 1 pill twice daily for 5 days.  Reviewed risks/side effects of prednisone  Cough--No tussionex  due to chronic narcotics--can take 2 vicodin at bedtime along with a temazepam.  Refill x #5.  She will call with dose (?7.5 vs 15) so we can call in #5, no refills (dose was 15mg ) Continue tessalon prn during day, mucinex BID  If ongoing symptoms, may need CXR.  F/u 1-2 weeks if not better

## 2012-04-25 NOTE — Patient Instructions (Addendum)
Bronchitis/sinusitis--resolving with antibiotics Complete the course of antibiotics. Start prednisone--can do as prescribed, or can take 60 mg just for 2 days, then down to 40mg  for three days, then 20mg  for three days, then 1/2 tablet for 3-4 days, OR you can take 1 pill twice daily for 5 days Consider taking decongestants to help dry up the nose, as postnasal drainage may contribute to cough  F/u in 1-2 weeks if not resolving for re-evaluation

## 2012-04-26 ENCOUNTER — Encounter: Payer: Self-pay | Admitting: Family Medicine

## 2012-04-26 LAB — COMPREHENSIVE METABOLIC PANEL
ALT: 33 U/L (ref 0–35)
CO2: 27 mEq/L (ref 19–32)
Calcium: 9.7 mg/dL (ref 8.4–10.5)
Chloride: 101 mEq/L (ref 96–112)
Creat: 0.66 mg/dL (ref 0.50–1.10)
Glucose, Bld: 101 mg/dL — ABNORMAL HIGH (ref 70–99)
Sodium: 140 mEq/L (ref 135–145)
Total Bilirubin: 0.6 mg/dL (ref 0.3–1.2)
Total Protein: 7.3 g/dL (ref 6.0–8.3)

## 2012-05-02 ENCOUNTER — Other Ambulatory Visit: Payer: Self-pay | Admitting: Family Medicine

## 2012-05-02 NOTE — Telephone Encounter (Signed)
I called over to [facility] pharmacy and the patient did pick up the #5. Do you want me to call in #15?

## 2012-05-02 NOTE — Telephone Encounter (Signed)
Spoke with patient and she is still coughing and needs more called in. I called #15 in to [facility] OP pharmacy.

## 2012-05-02 NOTE — Telephone Encounter (Signed)
This was sent to me from pharmacy, I called in #5 and told her that I was calling in #5 when I spoke with her. Pharmacy is stating that she wants rx for at least #20 called in. Thanks.

## 2012-05-02 NOTE — Telephone Encounter (Signed)
Did she already pick up the #5? I told her at the visit that was the plan (only to use short-term, for the insomnia related to prednisone and her illness).  She is to use sparingly, so I'm okay with her having #20 if she didn't already fill--should last a long time.  This is NOT intended to be used regularly for insomnia.  She was originally rx'd this for travel to [place] with mission trip, and I offered to give more so she would have for trip this year, but she isn't planning to go this year (so that's why the quantity was left small).

## 2012-05-02 NOTE — Telephone Encounter (Signed)
If she has already picked up the 5, see if she even needs more right now. If so, okay for additional 15 only (to last a while), as per prev reasons

## 2012-06-14 ENCOUNTER — Other Ambulatory Visit: Payer: Self-pay | Admitting: Family Medicine

## 2012-06-21 ENCOUNTER — Other Ambulatory Visit: Payer: Self-pay | Admitting: Family Medicine

## 2012-06-24 ENCOUNTER — Encounter: Payer: Self-pay | Admitting: Internal Medicine

## 2012-06-29 ENCOUNTER — Other Ambulatory Visit: Payer: Self-pay | Admitting: Family Medicine

## 2012-07-01 ENCOUNTER — Encounter: Payer: 59 | Admitting: Internal Medicine

## 2012-07-07 ENCOUNTER — Other Ambulatory Visit: Payer: Self-pay | Admitting: Family Medicine

## 2012-07-07 ENCOUNTER — Ambulatory Visit (INDEPENDENT_AMBULATORY_CARE_PROVIDER_SITE_OTHER): Payer: 59 | Admitting: Family Medicine

## 2012-07-07 ENCOUNTER — Encounter: Payer: Self-pay | Admitting: Family Medicine

## 2012-07-07 VITALS — BP 136/80 | HR 60 | Ht 65.5 in | Wt 156.0 lb

## 2012-07-07 DIAGNOSIS — J45998 Other asthma: Secondary | ICD-10-CM

## 2012-07-07 DIAGNOSIS — M159 Polyosteoarthritis, unspecified: Secondary | ICD-10-CM

## 2012-07-07 DIAGNOSIS — K219 Gastro-esophageal reflux disease without esophagitis: Secondary | ICD-10-CM

## 2012-07-07 DIAGNOSIS — R252 Cramp and spasm: Secondary | ICD-10-CM

## 2012-07-07 DIAGNOSIS — J309 Allergic rhinitis, unspecified: Secondary | ICD-10-CM

## 2012-07-07 DIAGNOSIS — J45909 Unspecified asthma, uncomplicated: Secondary | ICD-10-CM

## 2012-07-07 DIAGNOSIS — E782 Mixed hyperlipidemia: Secondary | ICD-10-CM

## 2012-07-07 DIAGNOSIS — Z79899 Other long term (current) drug therapy: Secondary | ICD-10-CM

## 2012-07-07 MED ORDER — BECLOMETHASONE DIPROPIONATE 80 MCG/ACT IN AERS: 1.0000 | INHALATION_SPRAY | Freq: Two times a day (BID) | RESPIRATORY_TRACT | Status: DC

## 2012-07-07 MED ORDER — MONTELUKAST SODIUM 10 MG PO TABS: 10.0000 mg | ORAL_TABLET | Freq: Every day | ORAL | Status: DC

## 2012-07-07 MED ORDER — FLUTICASONE PROPIONATE 50 MCG/ACT NA SUSP: NASAL | Status: DC

## 2012-07-07 MED ORDER — ESOMEPRAZOLE MAGNESIUM 40 MG PO CPDR: 40.0000 mg | DELAYED_RELEASE_CAPSULE | Freq: Every day | ORAL | Status: DC

## 2012-07-07 MED ORDER — SIMVASTATIN 40 MG PO TABS: ORAL_TABLET | ORAL | Status: DC

## 2012-07-07 MED ORDER — CELECOXIB 200 MG PO CAPS: ORAL_CAPSULE | ORAL | Status: DC

## 2012-07-07 NOTE — Progress Notes (Signed)
Chief Complaint  Patient presents with  . Hyperlipidemia    fasting med check.   Patient presents for med check, needing refills for allergies, asthma, cholesterol, reflux.  Allergies:  Doing well with singulair, flonase and zyrtec, keeping things under control.  She also uses decongestant twice daily to get the drainage under control (phenylephrine).  She would like flonase rx changed to 90 day supply.  Asthma:  Doing well on Singulair, and Qvar. Hasn't needed any albuterol (just with last illness).  GERD:  Controlled with Nexium, needs to use it daily.  Has recurrent reflux if she misses a dose.  Denies dysphagia or side effects.  No abdominal pain.  Hyperlipidemia follow-up:  Patient is reportedly following a low-fat, low cholesterol diet.  Compliant with medications and denies medication side effects.  On fish oil and simvastatin.  Hot flashes/sweats.  Using black cohosh, ?slight benefit  She sees Dr. Vear Clock every other month, and BP is always "super low" there.  He prescribes the pain meds (fentanyl, hydrocodone, neurontin), he also prescribes metanx and the paxil for depression.  Having leg cramps in the last 6 weeks, wakes her up at night (more in left than right).  Past Medical History  Diagnosis Date  . DJD (degenerative joint disease)   . Chronic pain   . Back pain     Dr Byrd Hesselbach 02/2010-epidural injection x 2 at L4-5 with good effect  . Asthma   . GERD (gastroesophageal reflux disease)   . Allergy   . HLD (hyperlipidemia)     hypertriglyceridemia  . Osteoarthritis     feet,shoulder,neck,back,hips and hands.  . IBS (irritable bowel syndrome)   . Internal hemorrhoids 1998  . Diverticulosis of colon 1998    mild  . Duodenal ulcer     h/o  . BCC (basal cell carcinoma of skin)     Dr. Jorja Loa  . History of sinus bradycardia   . Mitral regurgitation     mild  . Vitamin D deficiency     mild  . History of SCC (squamous cell carcinoma) of skin   . Cataract      left  . Depression    Past Surgical History  Procedure Laterality Date  . Cholecystectomy  1992  . Wisdom tooth extraction    . Tonsillectomy  age 37  . Neck surgery  1989    c6-7 cervical laminectomy and diskecotmy  . Shoulder surgery  2/05    left rotator cuff repair  . Total abdominal hysterectomy w/ bilateral salpingoophorectomy  1997    fibroids  . Hip surgery      right bursectomy x 3  . Inguinal hernia repair  9/09    bilat  . Carpal tunnel release  1989    bilateral  . Epidural steroid injection, back  02/2010  . Colonoscopy  2006  . Tubal ligation  1986  . Cesarean section      x2  . Bunionectomy      R 12/08, L 2004 (Dr. Wynelle Cleveland)  . Colonoscopy  01/2012    due again 01/2022; mild diverticulosis   History   Social History  . Marital Status: Divorced    Spouse Name: N/A    Number of Children: 2  . Years of Education: N/A   Occupational History  . STAFF NURSE    Social History Main Topics  . Smoking status: Never Smoker   . Smokeless tobacco: Never Used  . Alcohol Use: 4.2 oz/week    7 Glasses  of wine per week     Comment: 1 glass of wine daily.  . Drug Use: No  . Sexually Active: Not Currently   Other Topics Concern  . Not on file   Social History Narrative   Divorced, lives alone, [place]; exercises 1 day per week.  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  Son lives in [place], daughter lives in [place]   Current outpatient prescriptions:acetaminophen (TYLENOL) 500 MG tablet, Take 1,000 mg by mouth as needed., Disp: , Rfl: ;  beclomethasone (QVAR) 80 MCG/ACT inhaler, Inhale 1 puff into the lungs 2 (two) times daily., Disp: 3 Inhaler, Rfl: 3;  Calcium Carbonate-Vitamin D (CALCIUM 600+D) 600-200 MG-UNIT TABS, Take 1 tablet by mouth 2 (two) times daily.  , Disp: , Rfl:  CELEBREX 200 MG capsule, TAKE 1 CAPSULE BY MOUTH TWICE DAILY, Disp: 180 capsule, Rfl: 0;  cetirizine (ZYRTEC) 10 MG tablet, Take 10 mg by mouth daily.  , Disp: , Rfl: ;  cholecalciferol (VITAMIN D)  1000 UNITS tablet, Take 1,000 Units by mouth daily., Disp: , Rfl: ;  esomeprazole (NEXIUM) 40 MG capsule, Take 1 capsule (40 mg total) by mouth daily before breakfast., Disp: 90 capsule, Rfl: 0 fentaNYL (DURAGESIC - DOSED MCG/HR) 25 MCG/HR, Place 1 patch onto the skin every 3 (three) days.  , Disp: , Rfl: ;  fish oil-omega-3 fatty acids 1000 MG capsule, Take 2 g by mouth 2 (two) times daily., Disp: , Rfl: ;  fluticasone (FLONASE) 50 MCG/ACT nasal spray, PLACE 2 SPRAYS INTO THE NOSE DAILY., Disp: 16 g, Rfl: 5;  gabapentin (NEURONTIN) 800 MG tablet, Take 800 mg by mouth at bedtime. , Disp: , Rfl:  HYDROcodone-acetaminophen (NORCO/VICODIN) 5-325 MG per tablet, Take 1 tablet by mouth every 6 (six) hours as needed. pain, Disp: , Rfl: ;  l-methylfolate-B6-B12 (METANX) 3-35-2 MG TABS, Take 1 tablet by mouth daily. Dr.Phillips, Disp: , Rfl: ;  montelukast (SINGULAIR) 10 MG tablet, Take 1 tablet (10 mg total) by mouth at bedtime., Disp: 90 tablet, Rfl: 0;  PARoxetine (PAXIL) 20 MG tablet, Take 20 mg by mouth every morning.  , Disp: , Rfl:  simvastatin (ZOCOR) 40 MG tablet, TAKE 1 TABLET BY MOUTH AT BEDTIME, Disp: 90 tablet, Rfl: 0;  vitamin B-12 (CYANOCOBALAMIN) 1000 MCG tablet, Take 1,000 mcg by mouth daily., Disp: , Rfl: ;  albuterol (PROVENTIL HFA;VENTOLIN HFA) 108 (90 BASE) MCG/ACT inhaler, Inhale 2 puffs into the lungs every 6 (six) hours as needed for wheezing., Disp: 18 g, Rfl: 0 Multiple Vitamins-Minerals (MULTIVITAMIN WITH MINERALS) tablet, Take 1 tablet by mouth daily., Disp: , Rfl: ;  temazepam (RESTORIL) 15 MG capsule, TAKE 1 CAPSULE BY MOUTH AT BEDTIME AS NEEDED, Disp: 15 capsule, Rfl: 0  Allergies  Allergen Reactions  . Adhesive (Tape) Rash  . Codeine Rash   ROS: Denies fevers.  +congestion/allergies, controlled.  Denies bleeding/bruising, nausea, vomiting, dysphagia, bowel changes.  Moods are good. + leg cramps.  Denies edema.  Chest pain, shortness of breath.   PHYSICAL EXAM: BP 136/80  Pulse  60  Ht 5' 5.5" (1.664 m)  Wt 156 lb (70.761 kg)  BMI 25.56 kg/m2 Well developed, pleasant female in no distress HEENT:  Conjunctiva and sclera clear, EOMI.  TM's and EAC's normal. Nasal mucosa minimally edematous. No purulence or erythema. OP clear.  Sinuses nontedner Neck: no lymphadenopathy, thyromegaly or carotid bruit Heart: regular rate and rhythm without murmur Lungs: clear bilaterally.  No wheezes with forced expiration. Abdomen: soft, nontender, no organomegaly or mass Extremities: no edema,  2+ pulses Neuro: alert and oriented.  Cranial nerves intact.  Normal gait, sensation, strength  ASSESSMENT/PLAN:  Mixed hyperlipidemia - Plan: Lipid panel, simvastatin (ZOCOR) 40 MG tablet  Allergic rhinitis, cause unspecified - Plan: montelukast (SINGULAIR) 10 MG tablet, fluticasone (FLONASE) 50 MCG/ACT nasal spray  Asthma in remission - Plan: montelukast (SINGULAIR) 10 MG tablet, beclomethasone (QVAR) 80 MCG/ACT inhaler  GERD (gastroesophageal reflux disease) - Plan: esomeprazole (NEXIUM) 40 MG capsule  Leg cramps - Plan: Basic metabolic panel, Magnesium  Encounter for long-term (current) use of other medications - Plan: Lipid panel, Magnesium  Osteoarthritis, multiple sites - Plan: celecoxib (CELEBREX) 200 MG capsule   CPE when 67 (?if Welcome to Medicare, if straight Medicare) Will need pneumovax when 67.

## 2012-07-07 NOTE — Patient Instructions (Addendum)
Continue current medications.  Please sign up for MyChart for your results. Need first Hep A vaccine date to enter into Epic.  If your labs are all normal, then consider trying the trick with the white bar of soap for leg cramps

## 2012-07-07 NOTE — Telephone Encounter (Signed)
Patient is here for med check.

## 2012-07-08 ENCOUNTER — Ambulatory Visit (INDEPENDENT_AMBULATORY_CARE_PROVIDER_SITE_OTHER): Payer: 59 | Admitting: Internal Medicine

## 2012-07-08 DIAGNOSIS — Z23 Encounter for immunization: Secondary | ICD-10-CM

## 2012-07-08 LAB — LIPID PANEL
Cholesterol: 188 mg/dL (ref 0–200)
Total CHOL/HDL Ratio: 4.8 Ratio
Triglycerides: 272 mg/dL — ABNORMAL HIGH (ref <150)
VLDL: 54 mg/dL — ABNORMAL HIGH (ref 0–40)

## 2012-07-08 LAB — BASIC METABOLIC PANEL
Calcium: 9.5 mg/dL (ref 8.4–10.5)
Potassium: 5 mEq/L (ref 3.5–5.3)
Sodium: 141 mEq/L (ref 135–145)

## 2012-07-19 ENCOUNTER — Other Ambulatory Visit: Payer: Self-pay | Admitting: Internal Medicine

## 2012-07-19 DIAGNOSIS — E782 Mixed hyperlipidemia: Secondary | ICD-10-CM

## 2012-07-23 NOTE — Progress Notes (Signed)
[facility] TRAVEL [facility]INIC NOTE  RFV: yellow fever vaccination Subjective:    Patient ID: Beverly [name], female    DOB: 08-12-1945, 67 y.o.   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 She is anticipating going to [place] in the coming year for a missionary trip   Review of Systems     Objective:   Physical Exam        Assessment & Plan:  - provided yellow fever vaccination

## 2012-09-07 ENCOUNTER — Other Ambulatory Visit: Payer: Self-pay | Admitting: Family Medicine

## 2012-09-07 NOTE — Telephone Encounter (Signed)
Is this okay to refill? 

## 2012-09-08 NOTE — Telephone Encounter (Signed)
Refused.  This has been for travel (to [place]), last rx'd in March for insomnia related to illness/cough.  Not intended for regular use

## 2012-09-09 ENCOUNTER — Telehealth: Payer: Self-pay | Admitting: Family Medicine

## 2012-09-09 MED ORDER — TEMAZEPAM 15 MG PO CAPS: 15.0000 mg | ORAL_CAPSULE | Freq: Every evening | ORAL | Status: DC | PRN

## 2012-09-09 NOTE — Telephone Encounter (Signed)
Called in #15 no refiils

## 2012-09-09 NOTE — Telephone Encounter (Signed)
Okay to refill #15, no additional refills

## 2012-09-21 ENCOUNTER — Telehealth: Payer: Self-pay | Admitting: Family Medicine

## 2012-09-21 NOTE — Telephone Encounter (Signed)
Faxed

## 2012-09-21 NOTE — Telephone Encounter (Signed)
PT wants you to fax over order for upcoming labs as she works in [employer] and can have them drawn there easier.  Fax [phone]

## 2012-09-27 ENCOUNTER — Other Ambulatory Visit: Payer: Self-pay | Admitting: Family Medicine

## 2012-10-13 ENCOUNTER — Other Ambulatory Visit: Payer: 59

## 2012-10-20 ENCOUNTER — Telehealth: Payer: Self-pay | Admitting: *Deleted

## 2012-10-20 NOTE — Telephone Encounter (Signed)
Left message for patient informing her of her lab results.

## 2012-10-21 ENCOUNTER — Encounter: Payer: Self-pay | Admitting: Medical

## 2012-11-09 ENCOUNTER — Encounter: Payer: Self-pay | Admitting: *Deleted

## 2012-12-22 ENCOUNTER — Other Ambulatory Visit: Payer: Self-pay

## 2013-01-23 ENCOUNTER — Other Ambulatory Visit: Payer: Self-pay | Admitting: Family Medicine

## 2013-01-23 NOTE — Telephone Encounter (Signed)
Left message for patient to return my call.

## 2013-01-23 NOTE — Telephone Encounter (Signed)
Is this okay to refill? 

## 2013-01-23 NOTE — Telephone Encounter (Signed)
She is due for labs--but I see that she is scheduled for later this week for welcome to medicare physical.  Can do then. See if she truly needs refill prior to her appt, or if can do at her visit

## 2013-01-25 ENCOUNTER — Telehealth: Payer: Self-pay | Admitting: *Deleted

## 2013-01-25 ENCOUNTER — Other Ambulatory Visit: Payer: Self-pay | Admitting: *Deleted

## 2013-01-25 DIAGNOSIS — Z79899 Other long term (current) drug therapy: Secondary | ICD-10-CM

## 2013-01-25 NOTE — Telephone Encounter (Signed)
Left message for patient to call and schedule lab appt for [facility]. Entered order and took out order for lipid as she does not need.

## 2013-01-25 NOTE — Telephone Encounter (Signed)
Patient called and let me know that she rescheduled her Welcome to Medicare for 07/2013. She would like a refill on the celebrex and is willing to come in for labwork. Lipid panel is in system as future order, what other tests would you like?

## 2013-01-25 NOTE — Telephone Encounter (Signed)
She needs c-met.  She does NOT need lipids. She had them done elsewhere in August, and results are scanned in.  Order can be removed, plan to recheck in June at her next visit (unless she would like lipids done again, it is <6 mos since her last check, and was improved on last check).  Dx v58.69

## 2013-01-26 ENCOUNTER — Encounter: Payer: 59 | Admitting: Family Medicine

## 2013-01-27 ENCOUNTER — Ambulatory Visit (INDEPENDENT_AMBULATORY_CARE_PROVIDER_SITE_OTHER): Payer: 59 | Admitting: Internal Medicine

## 2013-01-27 DIAGNOSIS — Z789 Other specified health status: Secondary | ICD-10-CM

## 2013-01-27 MED ORDER — MEFLOQUINE HCL 250 MG PO TABS: 250.0000 mg | ORAL_TABLET | ORAL | Status: DC

## 2013-01-27 MED ORDER — CIPROFLOXACIN HCL 500 MG PO TABS: 500.0000 mg | ORAL_TABLET | Freq: Two times a day (BID) | ORAL | Status: DC

## 2013-01-27 MED ORDER — ZOLPIDEM TARTRATE 5 MG PO TABS: 5.0000 mg | ORAL_TABLET | Freq: Every evening | ORAL | Status: DC | PRN

## 2013-01-27 NOTE — Progress Notes (Signed)
See travel note Subjective:    Beverly [name] is a 67 y.o. female who presents to the Infectious Disease clinic for travel consultation. Planned departure date: January          Planned return date: 12 days Countries of travel: [place] Areas in country: rural and urban   Accommodations: hotel Purpose of travel: missionary work Prior travel out of [place]: yes Currently ill / Fever: no History of liver or kidney disease: no  Data Review:  n/a   Review of Systems n/a    Objective:    n/a    Assessment:    No contraindications to travel. none      Plan:    Issues discussed: environmental concerns, future shots, jet lag, malaria, MVA safety, rabies, safe food/water, traveler's diarrhea, website/handouts for more information, what to do if ill upon return, what to do if ill while there and Yellow Fever. Immunizations recommended: none indicated. Malaria prophylaxis: mefloquine, weekly dose starting one week before entering endemic area, ending 4 weeks after leaving area Traveler's diarrhea prophylaxis: ciprofloxacin.

## 2013-01-29 ENCOUNTER — Encounter: Payer: Self-pay | Admitting: Family Medicine

## 2013-02-14 ENCOUNTER — Encounter: Payer: Self-pay | Admitting: Family Medicine

## 2013-02-16 DIAGNOSIS — J189 Pneumonia, unspecified organism: Secondary | ICD-10-CM

## 2013-02-16 HISTORY — DX: Pneumonia, unspecified organism: J18.9

## 2013-02-26 ENCOUNTER — Ambulatory Visit (INDEPENDENT_AMBULATORY_CARE_PROVIDER_SITE_OTHER): Payer: 59 | Admitting: Emergency Medicine

## 2013-02-26 VITALS — BP 120/80 | HR 50 | Temp 98.2°F | Resp 16 | Ht 65.0 in | Wt 159.0 lb

## 2013-02-26 DIAGNOSIS — J45901 Unspecified asthma with (acute) exacerbation: Secondary | ICD-10-CM

## 2013-02-26 MED ORDER — LEVOFLOXACIN 500 MG PO TABS: 500.0000 mg | ORAL_TABLET | Freq: Every day | ORAL | Status: AC

## 2013-02-26 MED ORDER — PREDNISONE 10 MG PO KIT: PACK | ORAL | Status: DC

## 2013-02-26 NOTE — Progress Notes (Signed)
[facility] [address] [place] [address] [phone]  Date:  02/26/2013   Name:  Beverly [name]   DOB:  [birth year]   MRN:  [id number]  PCP:  Vikki Ports, MD    Chief Complaint: Cough and Asthma   History of Present Illness:  Beverly [name] is a 68 y.o. very pleasant female patient who presents with the following:  Since Friday has nasal congestion and post nasal drainage.  Has increased the use of her rescue inhaler as her asthma has flared and she has increased wheezing associated with her cough productive of a mucopurulent sputum.  No fever or chills. No nausea or vomiting.  No improvement with over the counter medications or other home remedies. Denies other complaint or health concern today.   Patient Active Problem List   Diagnosis Date Noted  . Unspecified asthma, with exacerbation 04/25/2012  . GERD (gastroesophageal reflux disease) 04/06/2011  . Asthma in remission 04/06/2011  . Allergic rhinitis, cause unspecified 04/06/2011  . Mixed hyperlipidemia 04/06/2011  . Chronic back pain 04/06/2011    Past Medical History  Diagnosis Date  . DJD (degenerative joint disease)   . Chronic pain   . Back pain     Dr Joline Salt 02/2010-epidural injection x 2 at L4-5 with good effect  . Asthma   . GERD (gastroesophageal reflux disease)   . Allergy   . HLD (hyperlipidemia)     hypertriglyceridemia  . Osteoarthritis     feet,shoulder,neck,back,hips and hands.  . IBS (irritable bowel syndrome)   . Internal hemorrhoids 1998  . Diverticulosis of colon 1998    mild  . Duodenal ulcer     h/o  . BCC (basal cell carcinoma of skin)     Dr. Denna Haggard  . History of sinus bradycardia   . Mitral regurgitation     mild  . Vitamin D deficiency     mild  . History of SCC (squamous cell carcinoma) of skin   . Cataract     left  . Depression     Past Surgical History  Procedure Laterality Date  . Cholecystectomy  1992  . Wisdom tooth  extraction    . Tonsillectomy  age 37  . Neck surgery  1989    c6-7 cervical laminectomy and diskecotmy  . Shoulder surgery  2/05    left rotator cuff repair  . Total abdominal hysterectomy w/ bilateral salpingoophorectomy  1997    fibroids  . Hip surgery      right bursectomy x 3  . Inguinal hernia repair  9/09    bilat  . Carpal tunnel release  1989    bilateral  . Epidural steroid injection, back  02/2010  . Colonoscopy  2006  . Tubal ligation  1986  . Cesarean section      x2  . Bunionectomy      R 12/08, L 2004 (Dr. Janus Molder)  . Colonoscopy  01/2012    due again 01/2022; mild diverticulosis    History  Substance Use Topics  . Smoking status: Never Smoker   . Smokeless tobacco: Never Used  . Alcohol Use: 4.2 oz/week    7 Glasses of wine per week     Comment: 1 glass of wine daily.    Family History  Problem Relation Age of Onset  . Depression Mother   . Schizophrenia Mother   . Cancer Father     oral  . Heart disease Father   .  Cancer Sister     skin  . COPD Sister   . Hypertension Sister   . Hyperthyroidism Brother   . Hyperlipidemia Daughter   . Asthma Son   . Diabetes Maternal Grandmother   . Cancer Paternal Grandmother 7    colon cancer  . Colon cancer Paternal Grandmother     Allergies  Allergen Reactions  . Adhesive [Tape] Rash  . Codeine Rash    Medication list has been reviewed and updated.  Current Outpatient Prescriptions on File Prior to Visit  Medication Sig Dispense Refill  . acetaminophen (TYLENOL) 500 MG tablet Take 1,000 mg by mouth as needed.      Marland Kitchen albuterol (PROVENTIL HFA;VENTOLIN HFA) 108 (90 BASE) MCG/ACT inhaler Inhale 2 puffs into the lungs every 6 (six) hours as needed for wheezing.  18 g  0  . beclomethasone (QVAR) 80 MCG/ACT inhaler Inhale 1 puff into the lungs 2 (two) times daily.  3 Inhaler  3  . Calcium Carbonate-Vitamin D (CALCIUM 600+D) 600-200 MG-UNIT TABS Take 1 tablet by mouth 2 (two) times daily.        .  CELEBREX 200 MG capsule TAKE 1 CAPSULE BY MOUTH TWICE DAILY  180 capsule  0  . cetirizine (ZYRTEC) 10 MG tablet Take 10 mg by mouth daily.        . cholecalciferol (VITAMIN D) 1000 UNITS tablet Take 1,000 Units by mouth daily.      Marland Kitchen esomeprazole (NEXIUM) 40 MG capsule Take 1 capsule (40 mg total) by mouth daily before breakfast.  90 capsule  3  . fentaNYL (DURAGESIC - DOSED MCG/HR) 25 MCG/HR Place 1 patch onto the skin every 3 (three) days.        . fluticasone (FLONASE) 50 MCG/ACT nasal spray PLACE 2 SPRAYS INTO THE NOSE DAILY.  48 g  3  . gabapentin (NEURONTIN) 800 MG tablet Take 800 mg by mouth at bedtime.       Marland Kitchen HYDROcodone-acetaminophen (NORCO/VICODIN) 5-325 MG per tablet Take 1 tablet by mouth every 6 (six) hours as needed. pain      . mefloquine (LARIAM) 250 MG tablet Take 1 tablet (250 mg total) by mouth every 7 (seven) days.  11 tablet  0  . Multiple Vitamins-Minerals (MULTIVITAMIN WITH MINERALS) tablet Take 1 tablet by mouth daily.      . phenylephrine (SUDAFED PE) 10 MG TABS Take 10 mg by mouth every 4 (four) hours as needed.      . simvastatin (ZOCOR) 40 MG tablet TAKE 1 TABLET BY MOUTH AT BEDTIME  90 tablet  1  . temazepam (RESTORIL) 15 MG capsule Take 1 capsule (15 mg total) by mouth at bedtime as needed for sleep.  15 capsule  0  . zolpidem (AMBIEN) 5 MG tablet Take 1 tablet (5 mg total) by mouth at bedtime as needed for sleep.  3 tablet  0  . l-methylfolate-B6-B12 (METANX) 3-35-2 MG TABS Take 1 tablet by mouth daily. Dr.Phillips      . PARoxetine (PAXIL) 20 MG tablet Take 20 mg by mouth every morning.        . vitamin B-12 (CYANOCOBALAMIN) 1000 MCG tablet Take 1,000 mcg by mouth daily.       No current facility-administered medications on file prior to visit.    Review of Systems:  As per HPI, otherwise negative.    Physical Examination: Filed Vitals:   02/26/13 1409  BP: 120/80  Pulse: 50  Temp: 98.2 F (36.8 C)  Resp: 16  Filed Vitals:   02/26/13 1409   Height: 5\' 5"  (1.651 m)  Weight: 159 lb (72.122 kg)   Body mass index is 26.46 kg/(m^2). Ideal Body Weight: Weight in (lb) to have BMI = 25: 149.9  GEN: WDWN, NAD, Non-toxic, A & O x 3 HEENT: Atraumatic, Normocephalic. Neck supple. No masses, No LAD. Ears and Nose: No external deformity. CV: RRR, No M/G/R. No JVD. No thrill. No extra heart sounds. PULM: CTA B, no wheezes, crackles, rhonchi. No retractions. No resp. distress. No accessory muscle use. ABD: S, NT, ND, +BS. No rebound. No HSM. EXTR: No c/c/e NEURO Normal gait.  PSYCH: Normally interactive. Conversant. Not depressed or anxious appearing.  Calm demeanor.    Assessment and Plan: Exacerbation asthma Bronchitis levaquin Prednisone  Signed,  Ellison Carwin, MD

## 2013-02-26 NOTE — Patient Instructions (Signed)

## 2013-03-30 ENCOUNTER — Ambulatory Visit (INDEPENDENT_AMBULATORY_CARE_PROVIDER_SITE_OTHER): Payer: Medicare Other | Admitting: Family Medicine

## 2013-03-30 ENCOUNTER — Encounter: Payer: Self-pay | Admitting: Family Medicine

## 2013-03-30 VITALS — BP 138/86 | HR 72 | Temp 98.2°F | Ht 65.0 in | Wt 164.0 lb

## 2013-03-30 DIAGNOSIS — N390 Urinary tract infection, site not specified: Secondary | ICD-10-CM

## 2013-03-30 DIAGNOSIS — R3915 Urgency of urination: Secondary | ICD-10-CM

## 2013-03-30 LAB — POCT URINALYSIS DIPSTICK
Glucose, UA: NEGATIVE
Ketones, UA: NEGATIVE
Nitrite, UA: NEGATIVE
PH UA: 5
PROTEIN UA: NEGATIVE
RBC UA: NEGATIVE
SPEC GRAV UA: 1.02
Urobilinogen, UA: NEGATIVE

## 2013-03-30 MED ORDER — NITROFURANTOIN MONOHYD MACRO 100 MG PO CAPS: 100.0000 mg | ORAL_CAPSULE | Freq: Two times a day (BID) | ORAL | Status: DC

## 2013-03-30 NOTE — Patient Instructions (Signed)
  Drink plenty of fluids (you are likely somewhat dehydrated related to travel).  Treat with macrobid given recent cipro for traveler's diarrhea.  Call next week if symptoms aren't better.  Return for care (ER/UC) if you develop high fevers, flank pain, vomiting, unable to keep down the medicine.

## 2013-03-30 NOTE — Progress Notes (Signed)
Chief Complaint  Patient presents with  . Urinary Urgency    since last night and has had some burning too. Just completed 24 hours of air travel from mission trip in [place].    Symptoms started last night--urinary urgency, burning with urination.  Denies blood in the urine.  Denies any pelvic pain, just some discomfort right at the meatus.  Denies vaginal discharge, odor or itching.  Denies fevers or chills, nausea, vomiting or flank pain.  She had travelers diarrhea 3 days ago, and took Cipro x 4 doses. Diarrhea resolved.  Past Medical History  Diagnosis Date  . DJD (degenerative joint disease)   . Chronic pain   . Back pain     Dr Joline Salt 02/2010-epidural injection x 2 at L4-5 with good effect  . Asthma   . GERD (gastroesophageal reflux disease)   . Allergy   . HLD (hyperlipidemia)     hypertriglyceridemia  . Osteoarthritis     feet,shoulder,neck,back,hips and hands.  . IBS (irritable bowel syndrome)   . Internal hemorrhoids 1998  . Diverticulosis of colon 1998    mild  . Duodenal ulcer     h/o  . BCC (basal cell carcinoma of skin)     Dr. Denna Haggard  . History of sinus bradycardia   . Mitral regurgitation     mild  . Vitamin D deficiency     mild  . History of SCC (squamous cell carcinoma) of skin   . Cataract     left  . Depression    Past Surgical History  Procedure Laterality Date  . Cholecystectomy  1992  . Wisdom tooth extraction    . Tonsillectomy  age 8  . Neck surgery  1989    c6-7 cervical laminectomy and diskecotmy  . Shoulder surgery  2/05    left rotator cuff repair  . Total abdominal hysterectomy w/ bilateral salpingoophorectomy  1997    fibroids  . Hip surgery      right bursectomy x 3  . Inguinal hernia repair  9/09    bilat  . Carpal tunnel release  1989    bilateral  . Epidural steroid injection, back  02/2010  . Colonoscopy  2006  . Tubal ligation  1986  . Cesarean section      x2  . Bunionectomy      R 12/08, L 2004 (Dr.  Janus Molder)  . Colonoscopy  01/2012    due again 01/2022; mild diverticulosis  . Cataract extraction Left 08/2012    Dr.Hecker   History   Social History  . Marital Status: Divorced    Spouse Name: N/A    Number of Children: 2  . Years of Education: N/A   Occupational History  . STAFF NURSE    Social History Main Topics  . Smoking status: Never Smoker   . Smokeless tobacco: Never Used  . Alcohol Use: 4.2 oz/week    7 Glasses of wine per week     Comment: 1 glass of wine daily.  . Drug Use: No  . Sexual Activity: Not Currently   Other Topics Concern  . Not on file   Social History Narrative   Divorced, lives alone, [place]; exercises 1 day per week.  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 beclomethasone (QVAR) 80 MCG/ACT inhaler Inhale 1 puff into the lungs 2 (two) times daily.  . Calcium Carbonate-Vitamin D (  CALCIUM 600+D) 600-200 MG-UNIT TABS Take 1 tablet by mouth 2 (two) times daily.    . CELEBREX 200 MG capsule TAKE 1 CAPSULE BY MOUTH TWICE DAILY  . cetirizine (ZYRTEC) 10 MG tablet Take 10 mg by mouth daily.    Marland Kitchen esomeprazole (NEXIUM) 40 MG capsule Take 1 capsule (40 mg total) by mouth daily before breakfast.  . fentaNYL (DURAGESIC - DOSED MCG/HR) 25 MCG/HR Place 1 patch onto the skin every 3 (three) days.    . fluticasone (FLONASE) 50 MCG/ACT nasal spray PLACE 2 SPRAYS INTO THE NOSE DAILY.  Marland Kitchen HYDROcodone-acetaminophen (NORCO/VICODIN) 5-325 MG per tablet Take 1 tablet by mouth every 6 (six) hours as needed. pain  . l-methylfolate-B6-B12 (METANX) 3-35-2 MG TABS Take 1 tablet by mouth daily. Dr.Phillips  . mefloquine (LARIAM) 250 MG tablet Take 1 tablet (250 mg total) by mouth every 7 (seven) days.  Marland Kitchen PARoxetine (PAXIL) 20 MG tablet Take 20 mg by mouth every morning.    . simvastatin (ZOCOR) 40 MG tablet TAKE 1 TABLET BY MOUTH AT BEDTIME  . zolpidem (AMBIEN) 5 MG  tablet Take 1 tablet (5 mg total) by mouth at bedtime as needed for sleep.  Marland Kitchen acetaminophen (TYLENOL) 500 MG tablet Take 1,000 mg by mouth as needed.  Marland Kitchen albuterol (PROVENTIL HFA;VENTOLIN HFA) 108 (90 BASE) MCG/ACT inhaler Inhale 2 puffs into the lungs every 6 (six) hours as needed for wheezing.  . gabapentin (NEURONTIN) 800 MG tablet Take 800 mg by mouth at bedtime.   . Multiple Vitamins-Minerals (MULTIVITAMIN WITH MINERALS) tablet Take 1 tablet by mouth daily.  . phenylephrine (SUDAFED PE) 10 MG TABS Take 10 mg by mouth every 4 (four) hours as needed.  . temazepam (RESTORIL) 15 MG capsule Take 1 capsule (15 mg total) by mouth at bedtime as needed for sleep.  . [DISCONTINUED] cholecalciferol (VITAMIN D) 1000 UNITS tablet Take 1,000 Units by mouth daily.  . [DISCONTINUED] PredniSONE 10 MG KIT Take medication as directed on package  . [DISCONTINUED] vitamin B-12 (CYANOCOBALAMIN) 1000 MCG tablet Take 1,000 mcg by mouth daily.   Allergies  Allergen Reactions  . Adhesive [Tape] Rash  . Codeine Rash   ROS:  Denies fevers, chills, nausea, vomiting.  Diarrhea from earlier in the week resolved s/p cipro.  Denies bleeding, bruising, rashes, headaches, dizziness, chest pain, URI symptoms or other complaints.  +hot flashes  PHYSICAL EXAM: BP 138/86  Pulse 72  Temp(Src) 98.2 F (36.8 C) (Oral)  Ht _0  (1.651 m)  Wt 164 lb (74.39 kg)  BMI 27.29 kg/m2 Well developed, pleasant female in no distress Back: no CVA tenderness Abdomen: soft, nontender.  No organomegaly or mass, no rebound or guarding   Urine dip: 1.020; 2+ leuks and 1+ bilirubin  ASSESSMENT/PLAN:  Urinary urgency - Plan: POCT Urinalysis Dipstick  Urinary tract infection, site not specified - Plan: Urine culture, nitrofurantoin, macrocrystal-monohydrate, (MACROBID) 100 MG capsule  Drink plenty of fluids (you are likely somewhat dehydrated related to travel).  Treat with macrobid given recent cipro for traveler's  diarrhea. Proper hygiene reviewed.  Call next week if symptoms aren't better.  Return for care (ER/UC) if you develop high fevers, flank pain, vomiting, unable to keep down the medicine.

## 2013-04-01 LAB — URINE CULTURE: Colony Count: >=100000

## 2013-05-02 ENCOUNTER — Other Ambulatory Visit: Payer: Self-pay | Admitting: Family Medicine

## 2013-05-02 NOTE — Telephone Encounter (Signed)
done

## 2013-05-02 NOTE — Telephone Encounter (Signed)
Is this okay to refill? 

## 2013-07-04 ENCOUNTER — Other Ambulatory Visit: Payer: Self-pay | Admitting: Family Medicine

## 2013-07-20 ENCOUNTER — Ambulatory Visit (INDEPENDENT_AMBULATORY_CARE_PROVIDER_SITE_OTHER): Payer: Medicare Other | Admitting: Family Medicine

## 2013-07-20 ENCOUNTER — Encounter: Payer: Self-pay | Admitting: Family Medicine

## 2013-07-20 VITALS — BP 130/80 | HR 56 | Ht 65.0 in | Wt 163.0 lb

## 2013-07-20 DIAGNOSIS — J45998 Other asthma: Secondary | ICD-10-CM

## 2013-07-20 DIAGNOSIS — Z0000 Encounter for general adult medical examination without abnormal findings: Secondary | ICD-10-CM

## 2013-07-20 DIAGNOSIS — R5381 Other malaise: Secondary | ICD-10-CM

## 2013-07-20 DIAGNOSIS — Z20828 Contact with and (suspected) exposure to other viral communicable diseases: Secondary | ICD-10-CM

## 2013-07-20 DIAGNOSIS — G8929 Other chronic pain: Secondary | ICD-10-CM

## 2013-07-20 DIAGNOSIS — R5383 Other fatigue: Secondary | ICD-10-CM

## 2013-07-20 DIAGNOSIS — J309 Allergic rhinitis, unspecified: Secondary | ICD-10-CM

## 2013-07-20 DIAGNOSIS — J45909 Unspecified asthma, uncomplicated: Secondary | ICD-10-CM

## 2013-07-20 DIAGNOSIS — M549 Dorsalgia, unspecified: Secondary | ICD-10-CM

## 2013-07-20 DIAGNOSIS — E782 Mixed hyperlipidemia: Secondary | ICD-10-CM

## 2013-07-20 DIAGNOSIS — Z206 Contact with and (suspected) exposure to human immunodeficiency virus [HIV]: Secondary | ICD-10-CM

## 2013-07-20 DIAGNOSIS — K219 Gastro-esophageal reflux disease without esophagitis: Secondary | ICD-10-CM

## 2013-07-20 DIAGNOSIS — Z79899 Other long term (current) drug therapy: Secondary | ICD-10-CM

## 2013-07-20 DIAGNOSIS — Z23 Encounter for immunization: Secondary | ICD-10-CM

## 2013-07-20 DIAGNOSIS — M2550 Pain in unspecified joint: Secondary | ICD-10-CM

## 2013-07-20 LAB — COMPREHENSIVE METABOLIC PANEL
ALT: 21 U/L (ref 0–35)
AST: 22 U/L (ref 0–37)
Albumin: 4.4 g/dL (ref 3.5–5.2)
Alkaline Phosphatase: 85 U/L (ref 39–117)
BILIRUBIN TOTAL: 0.5 mg/dL (ref 0.2–1.2)
BUN: 19 mg/dL (ref 6–23)
CO2: 31 mEq/L (ref 19–32)
CREATININE: 0.7 mg/dL (ref 0.50–1.10)
Calcium: 9.7 mg/dL (ref 8.4–10.5)
Chloride: 104 mEq/L (ref 96–112)
Glucose, Bld: 84 mg/dL (ref 70–99)
Potassium: 4.6 mEq/L (ref 3.5–5.3)
Sodium: 140 mEq/L (ref 135–145)
Total Protein: 6.5 g/dL (ref 6.0–8.3)

## 2013-07-20 LAB — POCT URINALYSIS DIPSTICK
BILIRUBIN UA: NEGATIVE
Blood, UA: NEGATIVE
GLUCOSE UA: NEGATIVE
KETONES UA: NEGATIVE
Leukocytes, UA: NEGATIVE
NITRITE UA: NEGATIVE
Protein, UA: NEGATIVE
Spec Grav, UA: 1.02
Urobilinogen, UA: NEGATIVE
pH, UA: 5

## 2013-07-20 LAB — CBC WITH DIFFERENTIAL/PLATELET
BASOS ABS: 0 10*3/uL (ref 0.0–0.1)
Basophils Relative: 0 % (ref 0–1)
EOS PCT: 3 % (ref 0–5)
Eosinophils Absolute: 0.1 10*3/uL (ref 0.0–0.7)
HEMATOCRIT: 39.1 % (ref 36.0–46.0)
HEMOGLOBIN: 13.1 g/dL (ref 12.0–15.0)
Lymphocytes Relative: 37 % (ref 12–46)
Lymphs Abs: 1.8 10*3/uL (ref 0.7–4.0)
MCH: 30.9 pg (ref 26.0–34.0)
MCHC: 33.5 g/dL (ref 30.0–36.0)
MCV: 92.2 fL (ref 78.0–100.0)
MONO ABS: 0.6 10*3/uL (ref 0.1–1.0)
Monocytes Relative: 12 % (ref 3–12)
Neutro Abs: 2.3 10*3/uL (ref 1.7–7.7)
Neutrophils Relative %: 48 % (ref 43–77)
Platelets: 157 10*3/uL (ref 150–400)
RBC: 4.24 MIL/uL (ref 3.87–5.11)
RDW: 14.9 % (ref 11.5–15.5)
WBC: 4.8 10*3/uL (ref 4.0–10.5)

## 2013-07-20 LAB — LIPID PANEL
CHOL/HDL RATIO: 5.1 ratio
Cholesterol: 197 mg/dL (ref 0–200)
HDL: 39 mg/dL — ABNORMAL LOW (ref >39)
LDL Cholesterol: 112 mg/dL — ABNORMAL HIGH (ref 0–99)
Triglycerides: 229 mg/dL — ABNORMAL HIGH (ref <150)
VLDL: 46 mg/dL — ABNORMAL HIGH (ref 0–40)

## 2013-07-20 LAB — TSH: TSH: 2.188 u[IU]/mL (ref 0.350–4.500)

## 2013-07-20 LAB — RHEUMATOID FACTOR

## 2013-07-20 MED ORDER — FLUTICASONE PROPIONATE 50 MCG/ACT NA SUSP: NASAL | Status: DC

## 2013-07-20 MED ORDER — MONTELUKAST SODIUM 10 MG PO TABS: 10.0000 mg | ORAL_TABLET | Freq: Every day | ORAL | Status: DC

## 2013-07-20 MED ORDER — SIMVASTATIN 40 MG PO TABS: ORAL_TABLET | ORAL | Status: DC

## 2013-07-20 MED ORDER — ESOMEPRAZOLE MAGNESIUM 40 MG PO CPDR: 40.0000 mg | DELAYED_RELEASE_CAPSULE | Freq: Every day | ORAL | Status: DC

## 2013-07-20 MED ORDER — CELECOXIB 200 MG PO CAPS: ORAL_CAPSULE | ORAL | Status: DC

## 2013-07-20 MED ORDER — BECLOMETHASONE DIPROPIONATE 80 MCG/ACT IN AERS: 1.0000 | INHALATION_SPRAY | Freq: Two times a day (BID) | RESPIRATORY_TRACT | Status: DC

## 2013-07-20 NOTE — Progress Notes (Signed)
Chief Complaint  Patient presents with  . Annual Exam    fasting annual exam with pelvic. Just had eye exam with Dr.Hecker. Would like to know if she should be tested for Hep C and HIV due to her age. Needs blood work for rheumatoid for Dr.Mark Philiips (PM)   Beverly [name] is a 68 y.o. female who presents for a complete physical.  She has the following concerns:  Desires Hep C screening due to age (baby boomer).  She has had labs for needle stick injuries, last was 4 years ago (at which time Hep C testing was likely done--pt agrees).  She would like HIV screening.  Dr. Hardin Negus would like bloodwork for rheumatoid arthritis done with her labs today.  She is having some increased heartburn.  She takes her Nexium about an hour before bedtime.  She is noticing the heartburn with dinner (when medication is wearing off).  Denies dysphagia. She needs Nexium refilled  Depression:  She has zero energy after getting home from work.  She denies feeling down, sad, crying, just lack of energy.  She was very fatigued when she took the Paxil in the morning, better since switching to bedtime dosing. Paxil is prescribed by Dr. Hardin Negus.  Asthma:  Only needs to use rescue inhaler when she has an illness/URI.  Denies effects from medication.  She needs singulair refilled.  Allergies:  She stayed on zyrtec, flonase and singulair through the Spring, and allergies were well-controlled.  Hyperlipidemia follow-up:  Patient is reportedly following a low-fat, low cholesterol diet.  Compliant with medications and denies medication side effects  Doctors caring for pt include Dr. Nicholaus Bloom (pain) Dr. Toy Cookey (dentist) Dr. Denna Haggard (derm) Dr. Herbert Deaner (ophtho) Dr. Maxie Better (ortho) Dr. Deatra Ina (GI)  End of life:  She has a living will and healthcare power of attorney See ADL and depression screening questionnaires (only +for some fatigue and sleep issues--see HPI)  Immunization History  Administered Date(s)  Administered  . Hepatitis A 06/17/2006, 01/05/2007  . IPV 04/15/2012  . Pneumococcal Polysaccharide-23 06/17/2006  . Tdap 09/16/2004  . Typhoid Live 06/17/2011  . Yellow Fever 07/08/2012  . Zoster 04/04/2009   She gets flu shots annually at work  Last Pap smear: 1997 (s/p hysterectomy)  Last mammogram: 10/2012 Last colonoscopy: 01/2012, Dr. Rich Fuchs diverticulosis; repeat in 10 years Last DEXA: 9/04  Dentist: twice yearly  Ophtho: yearly with Dr. Herbert Deaner, had cataract surgery last summer Exercise: minimal  Past Medical History  Diagnosis Date  . DJD (degenerative joint disease)   . Chronic pain   . Back pain     Dr Joline Salt 02/2010-epidural injection x 2 at L4-5 with good effect  . Asthma   . GERD (gastroesophageal reflux disease)   . Allergy   . HLD (hyperlipidemia)     hypertriglyceridemia  . Osteoarthritis     feet,shoulder,neck,back,hips and hands.  . IBS (irritable bowel syndrome)   . Internal hemorrhoids 1998  . Diverticulosis of colon 1998    mild  . Duodenal ulcer     h/o  . BCC (basal cell carcinoma of skin)     Dr. Denna Haggard  . History of sinus bradycardia   . Mitral regurgitation     mild  . Vitamin D deficiency     mild  . History of SCC (squamous cell carcinoma) of skin   . Cataract     left  . Depression     Past Surgical History  Procedure Laterality Date  . Cholecystectomy  1992  . Wisdom tooth extraction    . Tonsillectomy  age 62  . Neck surgery  1989    c6-7 cervical laminectomy and diskecotmy  . Shoulder surgery  2/05    left rotator cuff repair  . Total abdominal hysterectomy w/ bilateral salpingoophorectomy  1997    fibroids  . Hip surgery      right bursectomy x 3  . Inguinal hernia repair  9/09    bilat  . Carpal tunnel release  1989    bilateral  . Epidural steroid injection, back  02/2010  . Colonoscopy  2006  . Tubal ligation  1986  . Cesarean section      x2  . Bunionectomy      R 12/08, L 2004 (Dr. Janus Molder)   . Colonoscopy  01/2012    due again 01/2022; mild diverticulosis  . Cataract extraction Left 08/2012    Dr.Hecker    History   Social History  . Marital Status: Divorced    Spouse Name: N/A    Number of Children: 2  . Years of Education: N/A   Occupational History  . STAFF NURSE    Social History Main Topics  . Smoking status: Never Smoker   . Smokeless tobacco: Never Used  . Alcohol Use: 4.2 oz/week    7 Glasses of wine per week     Comment: 1 glass of wine daily.  . Drug Use: No  . Sexual Activity: Not Currently   Other Topics Concern  . Not on file   Social History Narrative   Divorced, lives alone, [place]; exercises 1 day per week (mowing grass).  Nurse at [employer].  Son lives in [place], daughter lives in [place]    Family History  Problem Relation Age of Onset  . Depression Mother   . Schizophrenia Mother   . Cancer Father     oral  . Heart disease Father   . Cancer Sister     skin and lung  . COPD Sister   . Hypertension Sister   . Hyperthyroidism Brother   . Cancer Brother     metastatic cancer to bone (?primary)  . Hyperlipidemia Daughter   . Asthma Son   . Diabetes Maternal Grandmother   . Cancer Paternal Grandmother 79    colon cancer  . Colon cancer Paternal Grandmother    Outpatient Encounter Prescriptions as of 07/20/2013  Medication Sig Note  . beclomethasone (QVAR) 80 MCG/ACT inhaler Inhale 1 puff into the lungs 2 (two) times daily.   . Calcium Carbonate-Vitamin D (CALCIUM 600+D) 600-200 MG-UNIT TABS Take 1 tablet by mouth 2 (two) times daily.     . celecoxib (CELEBREX) 200 MG capsule TAKE 1 CAPSULE BY MOUTH TWICE DAILY   . cetirizine (ZYRTEC) 10 MG tablet Take 10 mg by mouth daily.     Marland Kitchen esomeprazole (NEXIUM) 40 MG capsule Take 1 capsule (40 mg total) by mouth daily before supper.   . fentaNYL (DURAGESIC - DOSED MCG/HR) 25 MCG/HR Place 1 patch onto the skin every 3 (three) days.     . fluticasone (FLONASE) 50 MCG/ACT nasal spray PLACE  2 SPRAYS INTO THE NOSE DAILY.   Marland Kitchen gabapentin (NEURONTIN) 800 MG tablet Take 800 mg by mouth at bedtime.    Marland Kitchen HYDROcodone-acetaminophen (NORCO/VICODIN) 5-325 MG per tablet Take 1 tablet by mouth every 6 (six) hours as needed. pain 07/07/2012: 1 in morning, sometimes needs one in the afternoon  . methocarbamol (ROBAXIN) 500 MG tablet Take 500  mg by mouth every 8 (eight) hours as needed for muscle spasms. 07/20/2013: Takes 1 BID regularly (from Dr. Hardin Negus)  . montelukast (SINGULAIR) 10 MG tablet Take 1 tablet (10 mg total) by mouth at bedtime.   . Multiple Vitamins-Minerals (MULTIVITAMIN WITH MINERALS) tablet Take 1 tablet by mouth daily.   Marland Kitchen PARoxetine (PAXIL) 20 MG tablet Take 20 mg by mouth every morning.     . simvastatin (ZOCOR) 40 MG tablet TAKE 1 TABLET BY MOUTH AT BEDTIME   . [DISCONTINUED] beclomethasone (QVAR) 80 MCG/ACT inhaler Inhale 1 puff into the lungs 2 (two) times daily.   . [DISCONTINUED] CELEBREX 200 MG capsule TAKE 1 CAPSULE BY MOUTH TWICE DAILY   . [DISCONTINUED] esomeprazole (NEXIUM) 40 MG capsule Take 1 capsule (40 mg total) by mouth daily before breakfast.   . [DISCONTINUED] fluticasone (FLONASE) 50 MCG/ACT nasal spray PLACE 2 SPRAYS INTO THE NOSE DAILY.   . [DISCONTINUED] montelukast (SINGULAIR) 10 MG tablet Take 10 mg by mouth at bedtime.   . [DISCONTINUED] simvastatin (ZOCOR) 40 MG tablet TAKE 1 TABLET BY MOUTH AT BEDTIME   . acetaminophen (TYLENOL) 500 MG tablet Take 1,000 mg by mouth as needed.   Marland Kitchen albuterol (PROVENTIL HFA;VENTOLIN HFA) 108 (90 BASE) MCG/ACT inhaler Inhale 2 puffs into the lungs every 6 (six) hours as needed for wheezing. 07/20/2013: Uses prn, last used during URI  . phenylephrine (SUDAFED PE) 10 MG TABS Take 10 mg by mouth every 4 (four) hours as needed. 07/20/2013: Uses prn, not now   Allergies  Allergen Reactions  . Adhesive [Tape] Rash  . Codeine Rash   ROS: The patient denies anorexia, fever, weight changes, headaches, vision changes, decreased  hearing, ear pain, sore throat, breast concerns, chest pain, palpitations, dizziness, syncope, dyspnea on exertion, swelling, nausea, vomiting, diarrhea, constipation, abdominal pain, melena, hematochezia, indigestion/heartburn, hematuria, dysuria, vaginal bleeding, discharge, odor or itch, genital lesions, joint pains, numbness, tingling, weakness, tremor, suspicious skin lesions, depression, anxiety, abnormal bleeding/bruising, or enlarged lymph nodes.  +chronic back pain.  Pain is well controlled.  She has morning hand stiffness, and hip stiffness after sitting.  Some trouble sleeping at night related to some pain in back with position changes.  Gets back to sleep easily. Sees Dr. Denna Haggard regularly for skin checks Some stress urinary incontinence with cough  PHYSICAL EXAM: BP 130/80  Pulse 56  Ht 5\' 5"  (1.651 m)  Wt 163 lb (73.936 kg)  BMI 27.12 kg/m2  General Appearance:  Alert, cooperative, no distress, appears stated age   Head:  Normocephalic, without obvious abnormality, atraumatic   Eyes:  PERRL, conjunctiva/corneas clear, EOM's intact, fundi  Benign. Left eye is brown, right is blue.  Ears:  Normal TM's and external ear canals   Nose:  Nares normal, mucosa normal, no drainage or sinus tenderness   Throat:  Lips, mucosa, and tongue normal; teeth and gums normal   Neck:  Supple, no lymphadenopathy; thyroid: no enlargement/tenderness/nodules; no carotid  bruit or JVD   Back:  Spine nontender, no curvature, ROM normal, no CVA tenderness   Lungs:  Clear to auscultation bilaterally without wheezes, rales or ronchi; respirations unlabored   Chest Wall:  No tenderness or deformity   Heart:  Regular rate and rhythm, S1 and S2 normal, no murmur, rub  or gallop   Breast Exam:  No tenderness, masses, or nipple discharge or inversion. No axillary lymphadenopathy   Abdomen:  Soft, non-tender, nondistended, normoactive bowel sounds,  no masses, no hepatosplenomegaly   Genitalia:  Normal  external genitalia without lesions. Cyst present inside L introitus, nontender (unchanged). No vaginal discharge. Uterus surgically absent. No palpable adnexa or masses.   Rectal:  Normal tone, no masses or tenderness; guaiac negative stool   Extremities:  No clubbing, cyanosis or edema   Pulses:  2+ and symmetric all extremities   Skin:  Skin color, texture, turgor normal, no rashes.  She has multiple AK's on hands, forearms.  Lymph nodes:  Cervical, supraclavicular, and axillary nodes normal   Neurologic:  CNII-XII intact, normal strength, sensation and gait; reflexes 2+ and symmetric throughout          Psych: Normal mood, affect, hygiene and grooming.   ASSESSMENT/PLAN:  Routine general medical examination at a health care facility - Plan: TSH, HIV antibody  Chronic back pain - managed per Dr. Hardin Negus; pain is controlled on current medication, with no significant side effects (?fatigue related to meds?) - Plan: celecoxib (CELEBREX) 200 MG capsule  Mixed hyperlipidemia - Plan: simvastatin (ZOCOR) 40 MG tablet, Comprehensive metabolic panel, Lipid panel  Allergic rhinitis, cause unspecified - controlled - Plan: fluticasone (FLONASE) 50 MCG/ACT nasal spray, montelukast (SINGULAIR) 10 MG tablet  GERD (gastroesophageal reflux disease) - suboptimal control at dinner;  change timing of med to 1 hr prior to dinner - Plan: esomeprazole (NEXIUM) 40 MG capsule  Asthma in remission - Plan: beclomethasone (QVAR) 80 MCG/ACT inhaler, montelukast (SINGULAIR) 10 MG tablet  Encounter for long-term (current) use of other medications - Plan: Comprehensive metabolic panel, CBC with Differential  Exposure to HIV - Plan: HIV antibody  Other malaise and fatigue - differential dx reviewed in detail - Plan: Comprehensive metabolic panel, CBC with Differential, TSH  Joint pain - check RF per Dr. Hardin Negus (per pt) - Plan: Sedimentation rate, Rheumatoid factor  Need for prophylactic vaccination against  Streptococcus pneumoniae (pneumococcus) - Plan: Pneumococcal conjugate vaccine 13-valent   Discussed monthly self breast exams and yearly mammograms; at least 30 minutes of aerobic activity at least 5 days/week; proper sunscreen use reviewed; healthy diet, including goals of calcium and vitamin D intake and alcohol recommendations (less than or equal to 1 drink/day) reviewed; regular seatbelt use; changing batteries in smoke detectors.  Immunization recommendations discussed--Prevnar 13 today; TdaP due next year (see if she can get through employee health; not covered by Medicare).  Colonoscopy recommendations reviewed, UTD (due again 2023).   Prevnar-13 given. DEXA (rx given to be [facility] at [facility]) Tdap will be due next year--to check with employee health if they cover, since Medicare doesn't   Send copies of labs to Dr. Hardin Negus

## 2013-07-20 NOTE — Patient Instructions (Signed)
  HEALTH MAINTENANCE RECOMMENDATIONS:  It is recommended that you get at least 30 minutes of aerobic exercise at least 5 days/week (for weight loss, you may need as much as 60-90 minutes). This can be any activity that gets your heart rate up. This can be divided in 10-15 minute intervals if needed, but try and build up your endurance at least once a week.  Weight bearing exercise is also recommended twice weekly.  Eat a healthy diet with lots of vegetables, fruits and fiber.  "Colorful" foods have a lot of vitamins (ie green vegetables, tomatoes, red peppers, etc).  Limit sweet tea, regular sodas and alcoholic beverages, all of which has a lot of calories and sugar.  Up to 1 alcoholic drink daily may be beneficial for women (unless trying to lose weight, watch sugars).  Drink a lot of water.  Calcium recommendations are 1200-1500 mg daily (1500 mg for postmenopausal women or women without ovaries), and vitamin D 1000 IU daily.  This should be obtained from diet and/or supplements (vitamins), and calcium should not be taken all at once, but in divided doses.  Monthly self breast exams and yearly mammograms for women over the age of 63 is recommended.  Sunscreen of at least SPF 30 should be used on all sun-exposed parts of the skin when outside between the hours of 10 am and 4 pm (not just when at beach or pool, but even with exercise, golf, tennis, and yard work!)  Use a sunscreen that says "broad spectrum" so it covers both UVA and UVB rays, and make sure to reapply every 1-2 hours.  Remember to change the batteries in your smoke detectors when changing your clock times in the spring and fall.  Use your seat belt every time you are in a car, and please drive safely and not be distracted with cell phones and texting while driving.  Schedule bone density at [facility]--prescription was given

## 2013-07-21 ENCOUNTER — Encounter: Payer: Self-pay | Admitting: Family Medicine

## 2013-07-21 LAB — HIV ANTIBODY (ROUTINE TESTING W REFLEX): HIV 1&2 Ab, 4th Generation: NONREACTIVE

## 2013-07-21 LAB — SEDIMENTATION RATE: Sed Rate: 6 mm/hr (ref 0–22)

## 2013-07-21 NOTE — Progress Notes (Signed)
See other visit (appt canceled to change to CPE, not Welcome to Medicare)

## 2013-07-23 ENCOUNTER — Encounter: Payer: Self-pay | Admitting: Family Medicine

## 2013-10-11 ENCOUNTER — Ambulatory Visit: Payer: Self-pay

## 2013-10-11 ENCOUNTER — Other Ambulatory Visit: Payer: Self-pay | Admitting: Occupational Medicine

## 2013-10-11 DIAGNOSIS — R52 Pain, unspecified: Secondary | ICD-10-CM

## 2013-10-28 ENCOUNTER — Encounter: Payer: Self-pay | Admitting: Gastroenterology

## 2013-11-05 ENCOUNTER — Ambulatory Visit (INDEPENDENT_AMBULATORY_CARE_PROVIDER_SITE_OTHER): Payer: Medicare Other | Admitting: Family Medicine

## 2013-11-05 VITALS — BP 132/60 | HR 55 | Temp 97.8°F | Resp 12 | Ht 65.0 in | Wt 165.2 lb

## 2013-11-05 DIAGNOSIS — L255 Unspecified contact dermatitis due to plants, except food: Secondary | ICD-10-CM

## 2013-11-05 DIAGNOSIS — L237 Allergic contact dermatitis due to plants, except food: Secondary | ICD-10-CM

## 2013-11-05 MED ORDER — PREDNISONE 20 MG PO TABS: ORAL_TABLET | ORAL | Status: DC

## 2013-11-05 NOTE — Patient Instructions (Signed)
1.   Continue Zyrtec daily. 2.  You can add Zantac 150mg  one tablet twice daily.     Poison [employer] ivy is a inflammation of the skin (contact dermatitis) caused by touching the allergens on the leaves of the ivy plant following previous exposure to the plant. The rash usually appears 48 hours after exposure. The rash is usually bumps (papules) or blisters (vesicles) in a linear pattern. Depending on your own sensitivity, the rash may simply cause redness and itching, or it may also progress to blisters which may break open. These must be well cared for to prevent secondary bacterial (germ) infection, followed by scarring. Keep any open areas dry, clean, dressed, and covered with an antibacterial ointment if needed. The eyes may also get puffy. The puffiness is worst in the morning and gets better as the day progresses. This dermatitis usually heals without scarring, within 2 to 3 weeks without treatment. HOME CARE INSTRUCTIONS  Thoroughly wash with soap and water as soon as you have been exposed to poison ivy. You have about one half hour to remove the plant resin before it will cause the rash. This washing will destroy the oil or antigen on the skin that is causing, or will cause, the rash. Be sure to wash under your fingernails as any plant resin there will continue to spread the rash. Do not rub skin vigorously when washing affected area. Poison ivy cannot spread if no oil from the plant remains on your body. A rash that has progressed to weeping sores will not spread the rash unless you have not washed thoroughly. It is also important to wash any clothes you have been wearing as these may carry active allergens. The rash will return if you wear the unwashed clothing, even several days later. Avoidance of the plant in the future is the best measure. Poison ivy plant can be recognized by the number of leaves. Generally, poison ivy has three leaves with flowering branches on a single  stem. Diphenhydramine may be purchased over the counter and used as needed for itching. Do not drive with this medication if it makes you drowsy.Ask your caregiver about medication for children. SEEK MEDICAL CARE IF:  Open sores develop.  Redness spreads beyond area of rash.  You notice purulent (pus-like) discharge.  You have increased pain.  Other signs of infection develop (such as fever). Document Released: 01/31/2000 Document Revised: 04/27/2011 Document Reviewed: 07/13/2008 [facility] Patient Information 2015 [place], [place]. This information is not intended to replace advice given to you by your health care provider. Make sure you discuss any questions you have with your health care provider.

## 2013-11-05 NOTE — Progress Notes (Signed)
Subjective:    Patient ID: Beverly [name], female    DOB: 10-09-1945, 68 y.o.   MRN: [id number]  Poison Ivy Pertinent negatives include no fever or shortness of breath.   Chief Complaint  Patient presents with  . Poison Ivy   This chart was scribed for Reginia Forts, MD by Thea Alken, ED Scribe. This patient was seen in room 3 and the patient's care was started at 12:33 PM.  HPI Comments: Beverly [name] is a 68 y.o. female with hx asthma who presents to the [facility] complaining of worsening,  poison ivy noticed 2 days ago. Pt reports working in the yard one week ago. She now has an itchy, eythematous rash to face and bilateral arms. She reports hx of poison ivy. Pt has applied ointment without relief. She denies rash to torso and back. Pt is taking zyrtec one in the morning and one at night. Pt denies throat swelling and SOB.   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   Past Medical History  Diagnosis Date  . DJD (degenerative joint disease)   . Chronic pain   . Back pain     Dr Joline Salt 02/2010-epidural injection x 2 at L4-5 with good effect  . Asthma   . GERD (gastroesophageal reflux disease)   . Allergy   . HLD (hyperlipidemia)     hypertriglyceridemia  . Osteoarthritis     feet,shoulder,neck,back,hips and hands.  . IBS (irritable bowel syndrome)   . Internal hemorrhoids 1998  . Diverticulosis of colon 1998    mild  . Duodenal ulcer     h/o  . BCC (basal cell carcinoma of skin)     Dr. Denna Haggard  . History of sinus bradycardia   . Mitral regurgitation     mild  . Vitamin D deficiency     mild  . History of SCC (squamous cell carcinoma) of skin   . Cataract     left  . Depression    Past Surgical History  Procedure Laterality Date  . Cholecystectomy  1992  . Wisdom tooth extraction    . Tonsillectomy  age 85  . Neck surgery  1989    c6-7 cervical laminectomy and diskecotmy  . Shoulder surgery  2/05    left rotator  cuff repair  . Total abdominal hysterectomy w/ bilateral salpingoophorectomy  1997    fibroids  . Hip surgery      right bursectomy x 3  . Inguinal hernia repair  9/09    bilat  . Carpal tunnel release  1989    bilateral  . Epidural steroid injection, back  02/2010  . Colonoscopy  2006  . Tubal ligation  1986  . Cesarean section      x2  . Bunionectomy      R 12/08, L 2004 (Dr. Janus Molder)  . Colonoscopy  01/2012    due again 01/2022; mild diverticulosis  . Cataract extraction Left 08/2012    Dr.Hecker   Prior to Admission medications   Medication Sig Start Date End Date Taking? Authorizing Provider  acetaminophen (TYLENOL) 500 MG tablet Take 1,000 mg by mouth as needed.   Yes Historical Provider, MD  albuterol (PROVENTIL HFA;VENTOLIN HFA) 108 (90 BASE) MCG/ACT inhaler Inhale 2 puffs into the lungs every 6 (six) hours as needed for wheezing. 04/20/12  Yes Rita Ohara, MD  beclomethasone (QVAR) 80 MCG/ACT inhaler Inhale 1 puff into the lungs 2 (two) times daily. 07/20/13 10/21/14  Yes Rita Ohara, MD  Calcium Carbonate-Vitamin D (CALCIUM 600+D) 600-200 MG-UNIT TABS Take 1 tablet by mouth 2 (two) times daily.     Yes Historical Provider, MD  celecoxib (CELEBREX) 200 MG capsule TAKE 1 CAPSULE BY MOUTH TWICE DAILY 07/20/13  Yes Rita Ohara, MD  cetirizine (ZYRTEC) 10 MG tablet Take 10 mg by mouth daily.     Yes Historical Provider, MD  esomeprazole (NEXIUM) 40 MG capsule Take 1 capsule (40 mg total) by mouth daily before supper. 07/20/13  Yes Rita Ohara, MD  fentaNYL (DURAGESIC - DOSED MCG/HR) 25 MCG/HR Place 1 patch onto the skin every 3 (three) days.     Yes Historical Provider, MD  fluticasone (FLONASE) 50 MCG/ACT nasal spray PLACE 2 SPRAYS INTO THE NOSE DAILY. 07/20/13  Yes Rita Ohara, MD  gabapentin (NEURONTIN) 800 MG tablet Take 800 mg by mouth at bedtime.    Yes Historical Provider, MD  HYDROcodone-acetaminophen (NORCO/VICODIN) 5-325 MG per tablet Take 1 tablet by mouth every 6 (six) hours as needed.  pain   Yes Historical Provider, MD  methocarbamol (ROBAXIN) 500 MG tablet Take 500 mg by mouth every 8 (eight) hours as needed for muscle spasms.   Yes Historical Provider, MD  montelukast (SINGULAIR) 10 MG tablet Take 1 tablet (10 mg total) by mouth at bedtime. 07/20/13  Yes Rita Ohara, MD  Multiple Vitamins-Minerals (MULTIVITAMIN WITH MINERALS) tablet Take 1 tablet by mouth daily.   Yes Historical Provider, MD  PARoxetine (PAXIL) 20 MG tablet Take 20 mg by mouth every morning.     Yes Historical Provider, MD  phenylephrine (SUDAFED PE) 10 MG TABS Take 10 mg by mouth every 4 (four) hours as needed.   Yes Historical Provider, MD  simvastatin (ZOCOR) 40 MG tablet TAKE 1 TABLET BY MOUTH AT BEDTIME 07/20/13  Yes Rita Ohara, MD   Review of Systems  Constitutional: Negative for fever and chills.  HENT: Negative for facial swelling and trouble swallowing.   Respiratory: Negative for apnea and shortness of breath.   Skin: Positive for rash.   Objective:   Physical Exam  Nursing note and vitals reviewed. Constitutional: She is oriented to person, place, and time. She appears well-developed and well-nourished. No distress.  HENT:  Head: Normocephalic and atraumatic.  Mouth/Throat: Oropharynx is clear and moist.  Eyes: Conjunctivae and EOM are normal.  Neck: Neck supple.  Cardiovascular: Normal rate.   Pulmonary/Chest: Effort normal and breath sounds normal. She has no wheezes. She has no rales.  Musculoskeletal: Normal range of motion.  Neurological: She is alert and oriented to person, place, and time.  Skin: Skin is warm and dry. Rash noted. Rash is vesicular.  Vesicular rash diffusely on forehead and antecubital area on bilateral forearms.  Psychiatric: She has a normal mood and affect. Her behavior is normal.   Assessment & Plan:   1. Poison ivy dermatitis     1. Poison ivy dermatitis:  New.  Rx for Prednisone provided.  Continue Zyrtec bid.  Recommend adding Zantac 150mg  bid.  Local wound  care.     Meds ordered this encounter  Medications  . predniSONE (DELTASONE) 20 MG tablet    Sig: Three tablets daily x 1 day, then two tablets daily x 5 days, then one tablet daily x 5 days    Dispense:  18 tablet    Refill:  0   I personally performed the services described in this documentation, which was scribed in my presence.  The recorded information has been  reviewed and is accurate.  Reginia Forts, M.D.  [facility] [address] [place], [place]  [address] [phone] phone [phone] fax

## 2013-11-17 LAB — HM DEXA SCAN

## 2013-11-17 LAB — HM MAMMOGRAPHY

## 2013-11-22 ENCOUNTER — Ambulatory Visit: Payer: 59

## 2013-11-23 ENCOUNTER — Encounter: Payer: Self-pay | Admitting: Internal Medicine

## 2013-11-24 ENCOUNTER — Ambulatory Visit: Payer: PRIVATE HEALTH INSURANCE | Attending: Sports Medicine

## 2013-11-24 DIAGNOSIS — M6281 Muscle weakness (generalized): Secondary | ICD-10-CM | POA: Insufficient documentation

## 2013-11-24 DIAGNOSIS — R1030 Lower abdominal pain, unspecified: Secondary | ICD-10-CM | POA: Insufficient documentation

## 2013-11-24 DIAGNOSIS — Z5189 Encounter for other specified aftercare: Secondary | ICD-10-CM | POA: Insufficient documentation

## 2013-11-24 DIAGNOSIS — M25559 Pain in unspecified hip: Secondary | ICD-10-CM | POA: Insufficient documentation

## 2013-11-24 DIAGNOSIS — S76092A Other specified injury of muscle, fascia and tendon of left hip, initial encounter: Secondary | ICD-10-CM | POA: Insufficient documentation

## 2013-11-24 DIAGNOSIS — R262 Difficulty in walking, not elsewhere classified: Secondary | ICD-10-CM | POA: Insufficient documentation

## 2013-11-27 ENCOUNTER — Encounter: Payer: Self-pay | Admitting: Family Medicine

## 2013-11-28 ENCOUNTER — Encounter: Payer: Self-pay | Admitting: Internal Medicine

## 2013-11-29 ENCOUNTER — Telehealth: Payer: Self-pay | Admitting: *Deleted

## 2013-11-29 NOTE — Telephone Encounter (Signed)
Left message for patient to return my call to go over Dexa results.

## 2013-12-05 ENCOUNTER — Ambulatory Visit: Payer: PRIVATE HEALTH INSURANCE | Admitting: Physical Therapy

## 2013-12-07 ENCOUNTER — Ambulatory Visit: Payer: PRIVATE HEALTH INSURANCE

## 2013-12-07 DIAGNOSIS — Z5189 Encounter for other specified aftercare: Secondary | ICD-10-CM | POA: Diagnosis not present

## 2013-12-19 ENCOUNTER — Ambulatory Visit: Payer: PRIVATE HEALTH INSURANCE | Admitting: Physical Therapy

## 2014-01-25 ENCOUNTER — Other Ambulatory Visit (HOSPITAL_COMMUNITY): Payer: Self-pay | Admitting: Sports Medicine

## 2014-01-25 ENCOUNTER — Encounter: Payer: Self-pay | Admitting: *Deleted

## 2014-01-25 DIAGNOSIS — S76012D Strain of muscle, fascia and tendon of left hip, subsequent encounter: Secondary | ICD-10-CM

## 2014-02-05 ENCOUNTER — Telehealth: Payer: Self-pay | Admitting: Family Medicine

## 2014-02-05 DIAGNOSIS — G8929 Other chronic pain: Secondary | ICD-10-CM

## 2014-02-05 DIAGNOSIS — M549 Dorsalgia, unspecified: Secondary | ICD-10-CM

## 2014-02-05 DIAGNOSIS — Z5181 Encounter for therapeutic drug level monitoring: Secondary | ICD-10-CM

## 2014-02-05 NOTE — Telephone Encounter (Signed)
She is due for c-met.  Please schedule and once labs done, okay to refill x 6 mos

## 2014-02-05 NOTE — Telephone Encounter (Signed)
Left message for pt to call back and schedule an appt for lab work and then i will refill med for 6 months. What is the dx code for this?

## 2014-02-05 NOTE — Telephone Encounter (Signed)
i have Put in future orders but pt has not called to set up appt yet, so refill has not been done

## 2014-02-05 NOTE — Telephone Encounter (Signed)
Med monitoring

## 2014-02-06 NOTE — Telephone Encounter (Signed)
Called again and left a message stating that she has to made an appt for lab visit and then once labs are done we can refill for 6 months

## 2014-02-07 ENCOUNTER — Other Ambulatory Visit (HOSPITAL_COMMUNITY): Payer: Self-pay | Admitting: Sports Medicine

## 2014-02-07 ENCOUNTER — Ambulatory Visit (HOSPITAL_COMMUNITY): Admission: RE | Admit: 2014-02-07 | Payer: PRIVATE HEALTH INSURANCE | Source: Ambulatory Visit

## 2014-02-07 DIAGNOSIS — S76012D Strain of muscle, fascia and tendon of left hip, subsequent encounter: Secondary | ICD-10-CM

## 2014-02-08 ENCOUNTER — Other Ambulatory Visit (HOSPITAL_COMMUNITY): Payer: Self-pay | Admitting: Sports Medicine

## 2014-02-08 ENCOUNTER — Ambulatory Visit (HOSPITAL_COMMUNITY)
Admission: RE | Admit: 2014-02-08 | Discharge: 2014-02-08 | Disposition: A | Discharge status: Home / Self Care | Payer: PRIVATE HEALTH INSURANCE | Source: Ambulatory Visit | Attending: Sports Medicine | Admitting: Sports Medicine

## 2014-02-08 ENCOUNTER — Encounter: Payer: Self-pay | Admitting: Diagnostic Radiology

## 2014-02-08 DIAGNOSIS — S76012D Strain of muscle, fascia and tendon of left hip, subsequent encounter: Secondary | ICD-10-CM

## 2014-02-08 DIAGNOSIS — M7072 Other bursitis of hip, left hip: Secondary | ICD-10-CM | POA: Insufficient documentation

## 2014-02-08 DIAGNOSIS — M25552 Pain in left hip: Secondary | ICD-10-CM | POA: Insufficient documentation

## 2014-02-08 DIAGNOSIS — M47896 Other spondylosis, lumbar region: Secondary | ICD-10-CM | POA: Insufficient documentation

## 2014-02-08 DIAGNOSIS — W19XXXA Unspecified fall, initial encounter: Secondary | ICD-10-CM | POA: Insufficient documentation

## 2014-02-08 DIAGNOSIS — M25452 Effusion, left hip: Secondary | ICD-10-CM | POA: Insufficient documentation

## 2014-02-08 MED ORDER — GADOBENATE DIMEGLUMINE 529 MG/ML IV SOLN
5.0000 mL | Freq: Once | INTRAVENOUS | Status: AC | PRN
  Administered 2014-02-08: 5 mL via INTRAVENOUS

## 2014-02-08 MED ORDER — IOHEXOL 300 MG/ML  SOLN
50.0000 mL | Freq: Once | INTRAMUSCULAR | Status: AC | PRN
  Administered 2014-02-08: 10 mL

## 2014-02-08 NOTE — Procedures (Signed)
CLINICAL DATA:   Left hip pain  EXAM:  Left HIP INJECTION UNDER FLUOROSCOPY, pre MRI  FLUOROSCOPY TIME:   0 min, 32 seconds  PROCEDURE:  I discussed the risks (including hemorrhage, infection, and allergic  reaction, among others), benefits, and alternatives to the procedure  with the patient. We specifically discussed the high technical  likelihood of success of the procedure. The patient understood and  elected to undergo the procedure.  Standard time-out was employed. Following sterile skin prep and  local anesthetic administration consisting of 1% lidocaine, a 22  gauge needle was advanced without difficulty into the left hip joint  under fluoroscopic guidance. After test injection of 1 cc Omnipaque  300 to ensure intra-articular placement, a total of 11 cc of a  combination of 8 cc Omnipaque 300, 7 cc normal saline, and 0.1 cc of  Magnevist was injected into the joint. The needle was subsequently  removed and the skin cleansed and bandaged. No immediate  complications were observed.  IMPRESSION:  Technically successful left hip injection under fluoroscopy.  Electronically Signed    By: Sherryl Barters M.D.

## 2014-02-12 ENCOUNTER — Telehealth: Payer: Self-pay | Admitting: Family Medicine

## 2014-02-12 NOTE — Telephone Encounter (Signed)
Faxed over lab order

## 2014-02-12 NOTE — Telephone Encounter (Signed)
Ok.  Please do, and cancel the future orders that were already placed (for c-met)

## 2014-02-12 NOTE — Telephone Encounter (Signed)
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  Please call pt after faxed

## 2014-02-13 MED ORDER — CELECOXIB 200 MG PO CAPS: ORAL_CAPSULE | ORAL | Status: DC

## 2014-02-13 NOTE — Telephone Encounter (Signed)
Pt has had labs done and are in Dr. Tomi Bamberger folder to be looked at. Refilled med for 6 months

## 2014-02-19 ENCOUNTER — Encounter: Payer: Self-pay | Admitting: Family Medicine

## 2014-04-11 ENCOUNTER — Other Ambulatory Visit: Payer: Self-pay | Admitting: Family Medicine

## 2014-04-12 NOTE — Telephone Encounter (Signed)
Just need a liitle clarification with this one. He was seen 07/31/13 and it looks like she was supposed to follow up with OV and labs 12/15. (6 months). Also looks like there was a telephone call 02/05/14 that states that she was only due for labs and it was ok to refill meds x 6 months after those labs. Last lipid was 6/15. Is she overdue or is it okay to refill and schedule CPE for 6/16?

## 2014-04-12 NOTE — Telephone Encounter (Signed)
You are correct. She was due for OV with recheck of lipids.  She was set up for c-met to be done before we would refill her Celebrex (she had never scheduled a 6 month OV, we just got refill request and had her do the chem before filling).  Her last lipids were abnormal, she was to start fish oil, and needs to have rechecked.  She needs med check, either with fasting labs prior, or a fasting check.  If she wants labs prior, needs to still schedule OV, but ok for her to get drawn elsewhere if easier for her.  Just lipids

## 2014-04-12 NOTE — Telephone Encounter (Signed)
Left message for patient to return my call.

## 2014-04-13 NOTE — Telephone Encounter (Signed)
Spoke with patient and will fax over lipid panel request Monday am @ 8:30 to patient as requested. She scheduled med check for 05/10/14-this was first appt she could make with her schedule. Refilled meds.

## 2014-04-20 ENCOUNTER — Other Ambulatory Visit: Payer: Self-pay | Admitting: Family Medicine

## 2014-05-09 DIAGNOSIS — C4491 Basal cell carcinoma of skin, unspecified: Secondary | ICD-10-CM

## 2014-05-09 HISTORY — DX: Basal cell carcinoma of skin, unspecified: C44.91

## 2014-05-10 ENCOUNTER — Encounter: Payer: Self-pay | Admitting: Family Medicine

## 2014-05-10 ENCOUNTER — Ambulatory Visit (INDEPENDENT_AMBULATORY_CARE_PROVIDER_SITE_OTHER): Payer: Medicare Other | Admitting: Family Medicine

## 2014-05-10 VITALS — BP 124/80 | HR 76 | Ht 65.5 in | Wt 163.6 lb

## 2014-05-10 DIAGNOSIS — K219 Gastro-esophageal reflux disease without esophagitis: Secondary | ICD-10-CM

## 2014-05-10 DIAGNOSIS — J309 Allergic rhinitis, unspecified: Secondary | ICD-10-CM

## 2014-05-10 DIAGNOSIS — J45998 Other asthma: Secondary | ICD-10-CM | POA: Diagnosis not present

## 2014-05-10 DIAGNOSIS — E782 Mixed hyperlipidemia: Secondary | ICD-10-CM | POA: Diagnosis not present

## 2014-05-10 MED ORDER — ATORVASTATIN CALCIUM 40 MG PO TABS: 40.0000 mg | ORAL_TABLET | Freq: Every day | ORAL | Status: DC

## 2014-05-10 NOTE — Patient Instructions (Signed)
Stop the simvastatin and change to atorvastatin. Continue your fish oil at 4000mg  daily. Continue lowfat diet. If you develop any muscle pains with the change in medication, try taking coenzyme Q10  Continue your other current medications.  In 3 months, get labs drawn (prescription was given to you)--chem panel and lipids (you need to be fasting).  Return in 6 months for your "physical"--annual wellness visit.

## 2014-05-10 NOTE — Progress Notes (Signed)
Chief Complaint  Patient presents with  . med check    med check, no other concerns   GERD:  Doing well on Nexium. She has recurrent heartburn if she misses a pill, or if she eats too late at night. Denies dysphagia.  Depression:  Doing well on Paxil, better when dose was increased from 20 to 75m.  This is prescribed by Dr. PHardin Negus  She is more engaged in the evenings than prior to dose change (never had problems with being involved/engaged at work).   Asthma: Only needs to use rescue inhaler when she has an illness/URI.Uses Qvar and Singulair regularly without side effects.   Allergies: She stays on zyrtec, flonase and singulair through the Spring.  Currently are allergies are well controlled.  (sometimes alternates between zyrtec and claritin).  Hyperlipidemia follow-up: Patient is reportedly following a low-fat, low cholesterol diet. Compliant with medications and denies medication side effects.  She increased her fish oil to 4/day and is taking simvastatin without side effects.  She had labs drawn prior to visit. Total cholesterol 194, TG 234, HDL 41, LDL 106, chol/HDL ratio 4.7 She had c-met done in December, which was normal (nonfasting glu 102).  Previously took Lipitor without side effects--stopped due to insurance coverage (when branded). She is still having issues with hot flashes, so prefers to avoid Niaspan.  PMH, PSH, SH, FH reviewed/updated Outpatient Encounter Prescriptions as of 05/10/2014  Medication Sig Note  . acetaminophen (TYLENOL) 500 MG tablet Take 1,000 mg by mouth as needed.   . beclomethasone (QVAR) 80 MCG/ACT inhaler Inhale 1 puff into the lungs 2 (two) times daily.   . Calcium Carbonate-Vitamin D (CALCIUM 600+D) 600-200 MG-UNIT TABS Take 1 tablet by mouth 2 (two) times daily.     . celecoxib (CELEBREX) 200 MG capsule TAKE 1 CAPSULE BY MOUTH TWICE DAILY   . cetirizine (ZYRTEC) 10 MG tablet Take 10 mg by mouth daily.     .Marland Kitchenesomeprazole (NEXIUM) 40 MG  capsule Take 1 capsule (40 mg total) by mouth daily before supper.   . fentaNYL (DURAGESIC - DOSED MCG/HR) 25 MCG/HR Place 1 patch onto the skin every 3 (three) days.     . fluticasone (FLONASE) 50 MCG/ACT nasal spray PLACE 2 SPRAYS INTO THE NOSE DAILY.   .Marland Kitchengabapentin (NEURONTIN) 800 MG tablet Take 800 mg by mouth at bedtime.    .Marland KitchenHYDROcodone-acetaminophen (NORCO/VICODIN) 5-325 MG per tablet Take 1 tablet by mouth every 6 (six) hours as needed. pain 05/10/2014: Hasn't needed in several months, uses prn breakthrough pain  . methocarbamol (ROBAXIN) 500 MG tablet Take 500 mg by mouth every 8 (eight) hours as needed for muscle spasms. 07/20/2013: Takes 1 BID regularly (from Dr. PHardin Negus  . montelukast (SINGULAIR) 10 MG tablet Take 1 tablet (10 mg total) by mouth at bedtime.   . Multiple Vitamins-Minerals (MULTIVITAMIN WITH MINERALS) tablet Take 1 tablet by mouth daily.   .Marland KitchenPARoxetine (PAXIL) 20 MG tablet Take 30 mg by mouth every morning.  05/10/2014: rx'd by Dr. PHardin Negus Takes one 387mtablet daily  . simvastatin (ZOCOR) 40 MG tablet TAKE 1 TABLET BY MOUTH AT BEDTIME   . albuterol (PROVENTIL HFA;VENTOLIN HFA) 108 (90 BASE) MCG/ACT inhaler Inhale 2 puffs into the lungs every 6 (six) hours as needed for wheezing. (Patient not taking: Reported on 05/10/2014) 07/20/2013: Uses prn, last used during URI  . phenylephrine (SUDAFED PE) 10 MG TABS Take 10 mg by mouth every 4 (four) hours as needed. 07/20/2013: Uses prn, not now  . [  DISCONTINUED] predniSONE (DELTASONE) 20 MG tablet Three tablets daily x 1 day, then two tablets daily x 5 days, then one tablet daily x 5 days   . [DISCONTINUED] simvastatin (ZOCOR) 40 MG tablet TAKE 1 TABLET BY MOUTH AT BEDTIME    Allergies  Allergen Reactions  . Adhesive [Tape] Rash  . Codeine Rash   ROS: no headaches, dizziness, fever, chills, URI symptoms, cough, shortness of breath, chest pain, palpitations, nausea, vomiting, dysphagia, bowel changes, bleeding, bruising, rash.  Moods  are well controlled. Chronic pain is controlled.  See HPI  PHYSICAL EXAM: BP 124/80 mmHg  Pulse 76  Ht 5' 5.5" (1.664 m)  Wt 163 lb 9.6 oz (74.208 kg)  BMI 26.80 kg/m2 Well developed, pleasant female in no distress HEENT: PERRL, EOMI, conjunctiva and OP clear Neck: no lymphadenopathy, thyromegaly, carotid bruit or mass Heart: regular rate and rhythm without murmur Lungs: clear bilaterally. No wheezes, rales ronchi; normal air movement Abdomen: soft, nontender, no organomegaly or mass Extremities: no edema, 2+ pulse Skin: no rashes/lesions Psych: normal mood, affect, hygiene and grooming Neuro: alert and oriented. Cranial nerves intact. Normal strength, gait  ASSESSMENT/PLAN:  Asthma in remission - controlled.  PFT's today are normal - Plan: Spirometry with Graph, Spirometry with Graph  Gastroesophageal reflux disease, esophagitis presence not specified - controlled  Allergic rhinitis, unspecified allergic rhinitis type - controlled  Mixed hyperlipidemia - TG suboptimally controlled. doesn't want Niaspan trial due to hot flashes; change to stronger statin. - Plan: atorvastatin (LIPITOR) 40 MG tablet   Hyperlipidemia--TG remain above goal, and chol/HDL ratio is high.  She has maximized her fish oil dose, and has no room for improvement in her diet.  Prefers to avoid niaspan due to hot flashes already; prefers to avoid Crestor due to cost.  Previously tolerated lipitor.  Change to 49m atorvastatin to replace the simvastatin. Continue the 4g fish oil and lowfat diet. Recheck lipids and c-met (already due in June) in 3 months.  If has myalgias, start coenzyme Q10.  Asthma--PFT's today Risks of prolonged PPI vs untreated GERD reviewed in detail.  Continue PPI  F/u labs in 3 mos (written rx given for c-met and lipids) 6 months--med check+/AWV

## 2014-05-15 ENCOUNTER — Encounter: Payer: Self-pay | Admitting: Family Medicine

## 2014-08-03 ENCOUNTER — Telehealth: Payer: Self-pay | Admitting: Internal Medicine

## 2014-08-03 NOTE — Telephone Encounter (Signed)
Refill request montelukast 10mg , esomeprazole 40mg  #90 to [facility]

## 2014-08-06 ENCOUNTER — Other Ambulatory Visit: Payer: Self-pay | Admitting: *Deleted

## 2014-08-06 DIAGNOSIS — J45998 Other asthma: Secondary | ICD-10-CM

## 2014-08-06 DIAGNOSIS — K219 Gastro-esophageal reflux disease without esophagitis: Secondary | ICD-10-CM

## 2014-08-06 DIAGNOSIS — J309 Allergic rhinitis, unspecified: Secondary | ICD-10-CM

## 2014-08-06 MED ORDER — MONTELUKAST SODIUM 10 MG PO TABS: 10.0000 mg | ORAL_TABLET | Freq: Every day | ORAL | Status: DC

## 2014-08-06 MED ORDER — ESOMEPRAZOLE MAGNESIUM 40 MG PO CPDR: 40.0000 mg | DELAYED_RELEASE_CAPSULE | Freq: Every day | ORAL | Status: DC

## 2014-08-06 NOTE — Telephone Encounter (Signed)
Done. Sent for 90 days each.

## 2014-08-10 ENCOUNTER — Other Ambulatory Visit: Payer: Self-pay | Admitting: Family Medicine

## 2014-08-23 ENCOUNTER — Encounter: Payer: Self-pay | Admitting: Family Medicine

## 2014-09-03 ENCOUNTER — Encounter: Payer: Self-pay | Admitting: Family Medicine

## 2014-09-03 ENCOUNTER — Ambulatory Visit (INDEPENDENT_AMBULATORY_CARE_PROVIDER_SITE_OTHER): Payer: Medicare Other | Admitting: Family Medicine

## 2014-09-03 VITALS — BP 135/81 | HR 64 | Ht 65.0 in | Wt 160.0 lb

## 2014-09-03 DIAGNOSIS — M25551 Pain in right hip: Secondary | ICD-10-CM

## 2014-09-03 MED ORDER — METHYLPREDNISOLONE ACETATE 40 MG/ML IJ SUSP
40.0000 mg | Freq: Once | INTRAMUSCULAR | Status: AC
  Administered 2014-09-03: 40 mg via INTRA_ARTICULAR

## 2014-09-03 NOTE — Patient Instructions (Signed)
You have sacroiliitis and trochanteric bursitis. You were given a cortisone injection today into the bursa. Do home stretches - hold for 20-30 seconds, repeat 3 times once or twice a day. Side leg raises and standing hip rotations 3 sets of 10 once a day. Add ankle weight if these become too easy. Ibuprofen or aleve if needed for pain. Consider physical therapy. Try the insoles with scaphoid pads and lateral heel wedge for a week at least. Call me if you're having problems - we'll squeeze you in and make adjustments as needed. Otherwise follow up with me in 1 month.

## 2014-09-05 DIAGNOSIS — M25551 Pain in right hip: Secondary | ICD-10-CM | POA: Insufficient documentation

## 2014-09-05 NOTE — Progress Notes (Signed)
PCP: KNAPP,EVE A, MD  Subjective:   HPI: Patient is a 69 y.o. female here for right hip pain.  Patient reports for about 2 months she's had right hip pain. Feels this in posterior and lateral aspects of right hip. Walks 4 miles at a time on weekends. No radiation. No numbness/tingling. No bowel/bladder dysfunction.  Past Medical History  Diagnosis Date  . DJD (degenerative joint disease)   . Chronic pain   . Back pain     Dr Joline Salt 02/2010-epidural injection x 2 at L4-5 with good effect  . Asthma   . GERD (gastroesophageal reflux disease)   . Allergy   . HLD (hyperlipidemia)     hypertriglyceridemia  . Osteoarthritis     feet,shoulder,neck,back,hips and hands.  . IBS (irritable bowel syndrome)   . Internal hemorrhoids 1998  . Diverticulosis of colon 1998    mild  . Duodenal ulcer     h/o  . BCC (basal cell carcinoma of skin)     Dr. Denna Haggard  . History of sinus bradycardia   . Mitral regurgitation     mild  . Vitamin D deficiency     mild  . History of SCC (squamous cell carcinoma) of skin     Dr. Denna Haggard  . Cataract     left  . Depression     Current Outpatient Prescriptions on File Prior to Visit  Medication Sig Dispense Refill  . acetaminophen (TYLENOL) 500 MG tablet Take 1,000 mg by mouth as needed.    Marland Kitchen albuterol (PROVENTIL HFA;VENTOLIN HFA) 108 (90 BASE) MCG/ACT inhaler Inhale 2 puffs into the lungs every 6 (six) hours as needed for wheezing. (Patient not taking: Reported on 05/10/2014) 18 g 0  . atorvastatin (LIPITOR) 40 MG tablet Take 1 tablet (40 mg total) by mouth daily. 90 tablet 1  . beclomethasone (QVAR) 80 MCG/ACT inhaler Inhale 1 puff into the lungs 2 (two) times daily. 3 Inhaler 3  . Calcium Carbonate-Vitamin D (CALCIUM 600+D) 600-200 MG-UNIT TABS Take 1 tablet by mouth 2 (two) times daily.      . celecoxib (CELEBREX) 200 MG capsule TAKE 1 CAPSULE BY MOUTH TWICE DAILY 180 capsule 0  . cetirizine (ZYRTEC) 10 MG tablet Take 10 mg by mouth  daily.      Marland Kitchen esomeprazole (NEXIUM) 40 MG capsule Take 1 capsule (40 mg total) by mouth daily before supper. 90 capsule 0  . fentaNYL (DURAGESIC - DOSED MCG/HR) 25 MCG/HR Place 1 patch onto the skin every 3 (three) days.      . fluticasone (FLONASE) 50 MCG/ACT nasal spray PLACE 2 SPRAYS INTO THE NOSE DAILY. 48 g 3  . gabapentin (NEURONTIN) 800 MG tablet Take 800 mg by mouth at bedtime.     Marland Kitchen HYDROcodone-acetaminophen (NORCO/VICODIN) 5-325 MG per tablet Take 1 tablet by mouth every 6 (six) hours as needed. pain    . methocarbamol (ROBAXIN) 500 MG tablet Take 500 mg by mouth every 8 (eight) hours as needed for muscle spasms.    . montelukast (SINGULAIR) 10 MG tablet Take 1 tablet (10 mg total) by mouth at bedtime. 90 tablet 0  . Multiple Vitamins-Minerals (MULTIVITAMIN WITH MINERALS) tablet Take 1 tablet by mouth daily.    Marland Kitchen PARoxetine (PAXIL) 20 MG tablet Take 30 mg by mouth every morning.     . phenylephrine (SUDAFED PE) 10 MG TABS Take 10 mg by mouth every 4 (four) hours as needed.     No current facility-administered medications on file prior to visit.  Past Surgical History  Procedure Laterality Date  . Cholecystectomy  1992  . Wisdom tooth extraction    . Tonsillectomy  age 67  . Neck surgery  1989    c6-7 cervical laminectomy and diskecotmy  . Shoulder surgery  2/05    left rotator cuff repair  . Total abdominal hysterectomy w/ bilateral salpingoophorectomy  1997    fibroids  . Hip surgery      right bursectomy x 3  . Inguinal hernia repair  9/09    bilat  . Carpal tunnel release  1989    bilateral  . Epidural steroid injection, back  02/2010  . Colonoscopy  2006  . Tubal ligation  1986  . Cesarean section      x2  . Bunionectomy      R 12/08, L 2004 (Dr. Janus Molder)  . Colonoscopy  01/2012    due again 01/2022; mild diverticulosis  . Cataract extraction Left 08/2012    Dr.Hecker    Allergies  Allergen Reactions  . Adhesive [Tape] Rash  . Codeine Rash     History   Social History  . Marital Status: Divorced    Spouse Name: N/A  . Number of Children: 2  . Years of Education: N/A   Occupational History  . STAFF NURSE    Social History Main Topics  . Smoking status: Never Smoker   . Smokeless tobacco: Never Used  . Alcohol Use: 4.2 oz/week    7 Glasses of wine per week     Comment: 1 glass of wine daily.  . Drug Use: No  . Sexual Activity: Not Currently   Other Topics Concern  . Not on file   Social History Narrative   Divorced, lives alone, [place]; exercises 1 day per week (mowing grass).  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 Depression Mother   . Schizophrenia Mother   . Cancer Father     oral  . Heart disease Father   . Cancer Sister     skin and lung  . COPD Sister   . Hypertension Sister   . Hyperthyroidism Brother   . Cancer Brother     metastatic cancer to bone (?primary)  . Hyperlipidemia Daughter   . Asthma Son   . Diabetes Maternal Grandmother   . Cancer Paternal Grandmother 65    colon cancer  . Colon cancer Paternal Grandmother     BP 135/81 mmHg  Pulse 64  Ht 5\' 5"  (1.651 m)  Wt 160 lb (72.576 kg)  BMI 26.63 kg/m2  Review of Systems: See HPI above.    Objective:  Physical Exam:  Gen: NAD  Back/Right hip: Moderate overpronation. No gross deformity, scoliosis. TTP greater trochanter, SI joint.  No midline or bony TTP. FROM. Strength LEs 5/5 all muscle groups.   2+ MSRs in patellar and achilles tendons, equal bilaterally. Negative SLRs. Sensation intact to light touch bilaterally. Negative logroll bilateral hips Negative fabers and piriformis stretches.    Assessment & Plan:  1. Right hip pain - 2/2 Si dysfunction and trochanteric bursitis.  Injection given into bursa today.  Shown home exercises and stretches to do daily.  NSAIDs as needed.  She would like to try sports insoles with scaphoid  pads as well to see if this makes a difference.

## 2014-09-05 NOTE — Assessment & Plan Note (Signed)
2/2 Si dysfunction and trochanteric bursitis.  Injection given into bursa today.  Shown home exercises and stretches to do daily.  NSAIDs as needed.  She would like to try sports insoles with scaphoid pads as well to see if this makes a difference.

## 2014-10-01 ENCOUNTER — Ambulatory Visit (INDEPENDENT_AMBULATORY_CARE_PROVIDER_SITE_OTHER): Payer: Medicare Other | Admitting: Family Medicine

## 2014-10-01 ENCOUNTER — Encounter: Payer: Self-pay | Admitting: Family Medicine

## 2014-10-01 VITALS — Ht 65.0 in

## 2014-10-01 DIAGNOSIS — M25512 Pain in left shoulder: Secondary | ICD-10-CM

## 2014-10-01 DIAGNOSIS — M25551 Pain in right hip: Secondary | ICD-10-CM

## 2014-10-02 DIAGNOSIS — M25512 Pain in left shoulder: Secondary | ICD-10-CM | POA: Insufficient documentation

## 2014-10-02 DIAGNOSIS — M25519 Pain in unspecified shoulder: Secondary | ICD-10-CM | POA: Insufficient documentation

## 2014-10-02 NOTE — Assessment & Plan Note (Signed)
per report had MRI, injection, some PT without much improvement.  Start HEP.  Discussed more extensive PT, MRI with contrast, glenohumeral injection all considerations - worried she may have a labral tear based on injury, mechanism, and exam.

## 2014-10-02 NOTE — Assessment & Plan Note (Signed)
2/2 Si dysfunction and trochanteric bursitis.  Resolved following injection, home exercises/stretches.  Continue with inserts with scaphoid pads as well.  Given [employer] catalog and shown how to order these.

## 2014-10-02 NOTE — Progress Notes (Signed)
PCP: KNAPP,EVE A, MD  Subjective:   HPI: Patient is a 69 y.o. female here for right hip pain.  7/18: Patient reports for about 2 months she's had right hip pain. Feels this in posterior and lateral aspects of right hip. Walks 4 miles at a time on weekends. No radiation. No numbness/tingling. No bowel/bladder dysfunction.  8/15: Patient states right hip is completely improved. She's having problems with left shoulder. Had surgery 9 years ago. About 18 months ago lifted a piece of luggage off the carousel and felt pull in left shoulder. Improved some and did it again, same mechanism last fall. Still with pain in this shoulder, worse at nighttime. Reports had an injection, PT, MRI (no access to this though) that was reassuring.  Past Medical History  Diagnosis Date  . DJD (degenerative joint disease)   . Chronic pain   . Back pain     Dr Joline Salt 02/2010-epidural injection x 2 at L4-5 with good effect  . Asthma   . GERD (gastroesophageal reflux disease)   . Allergy   . HLD (hyperlipidemia)     hypertriglyceridemia  . Osteoarthritis     feet,shoulder,neck,back,hips and hands.  . IBS (irritable bowel syndrome)   . Internal hemorrhoids 1998  . Diverticulosis of colon 1998    mild  . Duodenal ulcer     h/o  . BCC (basal cell carcinoma of skin)     Dr. Denna Haggard  . History of sinus bradycardia   . Mitral regurgitation     mild  . Vitamin D deficiency     mild  . History of SCC (squamous cell carcinoma) of skin     Dr. Denna Haggard  . Cataract     left  . Depression     Current Outpatient Prescriptions on File Prior to Visit  Medication Sig Dispense Refill  . acetaminophen (TYLENOL) 500 MG tablet Take 1,000 mg by mouth as needed.    Marland Kitchen albuterol (PROVENTIL HFA;VENTOLIN HFA) 108 (90 BASE) MCG/ACT inhaler Inhale 2 puffs into the lungs every 6 (six) hours as needed for wheezing. (Patient not taking: Reported on 05/10/2014) 18 g 0  . atorvastatin (LIPITOR) 40 MG tablet Take  1 tablet (40 mg total) by mouth daily. 90 tablet 1  . beclomethasone (QVAR) 80 MCG/ACT inhaler Inhale 1 puff into the lungs 2 (two) times daily. 3 Inhaler 3  . Calcium Carbonate-Vitamin D (CALCIUM 600+D) 600-200 MG-UNIT TABS Take 1 tablet by mouth 2 (two) times daily.      . celecoxib (CELEBREX) 200 MG capsule TAKE 1 CAPSULE BY MOUTH TWICE DAILY 180 capsule 0  . cetirizine (ZYRTEC) 10 MG tablet Take 10 mg by mouth daily.      Marland Kitchen esomeprazole (NEXIUM) 40 MG capsule Take 1 capsule (40 mg total) by mouth daily before supper. 90 capsule 0  . fentaNYL (DURAGESIC - DOSED MCG/HR) 25 MCG/HR Place 1 patch onto the skin every 3 (three) days.      . fluticasone (FLONASE) 50 MCG/ACT nasal spray PLACE 2 SPRAYS INTO THE NOSE DAILY. 48 g 3  . gabapentin (NEURONTIN) 800 MG tablet Take 800 mg by mouth at bedtime.     Marland Kitchen HYDROcodone-acetaminophen (NORCO/VICODIN) 5-325 MG per tablet Take 1 tablet by mouth every 6 (six) hours as needed. pain    . methocarbamol (ROBAXIN) 500 MG tablet Take 500 mg by mouth every 8 (eight) hours as needed for muscle spasms.    . montelukast (SINGULAIR) 10 MG tablet Take 1 tablet (10 mg total)  by mouth at bedtime. 90 tablet 0  . Multiple Vitamins-Minerals (MULTIVITAMIN WITH MINERALS) tablet Take 1 tablet by mouth daily.    . phenylephrine (SUDAFED PE) 10 MG TABS Take 10 mg by mouth every 4 (four) hours as needed.     No current facility-administered medications on file prior to visit.    Past Surgical History  Procedure Laterality Date  . Cholecystectomy  1992  . Wisdom tooth extraction    . Tonsillectomy  age 69  . Neck surgery  1989    c6-7 cervical laminectomy and diskecotmy  . Shoulder surgery  2/05    left rotator cuff repair  . Total abdominal hysterectomy w/ bilateral salpingoophorectomy  1997    fibroids  . Hip surgery      right bursectomy x 3  . Inguinal hernia repair  9/09    bilat  . Carpal tunnel release  1989    bilateral  . Epidural steroid injection, back   02/2010  . Colonoscopy  2006  . Tubal ligation  1986  . Cesarean section      x2  . Bunionectomy      R 12/08, L 2004 (Dr. Janus Molder)  . Colonoscopy  01/2012    due again 01/2022; mild diverticulosis  . Cataract extraction Left 08/2012    Dr.Hecker    Allergies  Allergen Reactions  . Adhesive [Tape] Rash  . Codeine Rash    Social History   Social History  . Marital Status: Divorced    Spouse Name: N/A  . Number of Children: 2  . Years of Education: N/A   Occupational History  . STAFF NURSE    Social History Main Topics  . Smoking status: Never Smoker   . Smokeless tobacco: Never Used  . Alcohol Use: 4.2 oz/week    7 Glasses of wine per week     Comment: 1 glass of wine daily.  . Drug Use: No  . Sexual Activity: Not Currently   Other Topics Concern  . Not on file   Social History Narrative   Divorced, lives alone, [place]; exercises 1 day per week (mowing grass).  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 Depression Mother   . Schizophrenia Mother   . Cancer Father     oral  . Heart disease Father   . Cancer Sister     skin and lung  . COPD Sister   . Hypertension Sister   . Hyperthyroidism Brother   . Cancer Brother     metastatic cancer to bone (?primary)  . Hyperlipidemia Daughter   . Asthma Son   . Diabetes Maternal Grandmother   . Cancer Paternal Grandmother 82    colon cancer  . Colon cancer Paternal Grandmother     Ht 5\' 5"  (1.651 m)  Review of Systems: See HPI above.    Objective:  Physical Exam:  Gen: NAD  Back/Right hip: Moderate overpronation. No gross deformity, scoliosis. No TTP greater trochanter, SI joint.  No midline or bony TTP. FROM. Strength LEs 5/5 all muscle groups.   Negative SLRs. Negative logroll bilateral hips Negative fabers and piriformis stretches.  Left shoulder: No swelling, ecchymoses.  No gross deformity. No  TTP. FROM. Negative Hawkins, Neers. Negative Speeds, Yergasons. Strength 5/5 with empty can and resisted internal/external rotation. Negative apprehension. Pain with o'briens NV intact distally.    Assessment & Plan:  1.  Right hip pain - 2/2 Si dysfunction and trochanteric bursitis.  Resolved following injection, home exercises/stretches.  Continue with inserts with scaphoid pads as well.  Given [employer] catalog and shown how to order these.  2. Left shoulder pain - per report had MRI, injection, some PT without much improvement.  Start HEP.  Discussed more extensive PT, MRI with contrast, glenohumeral injection all considerations - worried she may have a labral tear based on injury, mechanism, and exam.

## 2014-10-18 ENCOUNTER — Encounter: Payer: Self-pay | Admitting: Family Medicine

## 2014-10-30 ENCOUNTER — Other Ambulatory Visit: Payer: Self-pay | Admitting: Family Medicine

## 2014-10-30 NOTE — Telephone Encounter (Signed)
Ok only for 90 days, NO REFILLS (as she will run out prior to her visit otherwise)

## 2014-10-30 NOTE — Telephone Encounter (Signed)
Pt has an appt next month is this okay to refill?

## 2014-11-01 ENCOUNTER — Encounter: Payer: Self-pay | Admitting: *Deleted

## 2014-11-08 ENCOUNTER — Encounter: Payer: Medicare Other | Admitting: Family Medicine

## 2014-11-12 ENCOUNTER — Other Ambulatory Visit: Payer: Self-pay | Admitting: Family Medicine

## 2014-11-12 NOTE — Telephone Encounter (Signed)
Is this okay to refill? 

## 2014-11-26 ENCOUNTER — Ambulatory Visit (INDEPENDENT_AMBULATORY_CARE_PROVIDER_SITE_OTHER): Payer: Medicare Other | Admitting: Family Medicine

## 2014-11-26 ENCOUNTER — Encounter: Payer: Self-pay | Admitting: Family Medicine

## 2014-11-26 VITALS — BP 130/84 | HR 68 | Temp 98.1°F | Ht 65.5 in | Wt 165.2 lb

## 2014-11-26 DIAGNOSIS — C4492 Squamous cell carcinoma of skin, unspecified: Secondary | ICD-10-CM

## 2014-11-26 DIAGNOSIS — K219 Gastro-esophageal reflux disease without esophagitis: Secondary | ICD-10-CM

## 2014-11-26 DIAGNOSIS — Z5181 Encounter for therapeutic drug level monitoring: Secondary | ICD-10-CM

## 2014-11-26 DIAGNOSIS — Z1159 Encounter for screening for other viral diseases: Secondary | ICD-10-CM

## 2014-11-26 DIAGNOSIS — N959 Unspecified menopausal and perimenopausal disorder: Secondary | ICD-10-CM

## 2014-11-26 DIAGNOSIS — M549 Dorsalgia, unspecified: Secondary | ICD-10-CM | POA: Diagnosis not present

## 2014-11-26 DIAGNOSIS — Z0000 Encounter for general adult medical examination without abnormal findings: Secondary | ICD-10-CM

## 2014-11-26 DIAGNOSIS — J45998 Other asthma: Secondary | ICD-10-CM | POA: Diagnosis not present

## 2014-11-26 DIAGNOSIS — J309 Allergic rhinitis, unspecified: Secondary | ICD-10-CM

## 2014-11-26 DIAGNOSIS — R5383 Other fatigue: Secondary | ICD-10-CM

## 2014-11-26 DIAGNOSIS — E782 Mixed hyperlipidemia: Secondary | ICD-10-CM | POA: Diagnosis not present

## 2014-11-26 DIAGNOSIS — Z23 Encounter for immunization: Secondary | ICD-10-CM | POA: Diagnosis not present

## 2014-11-26 DIAGNOSIS — E222 Syndrome of inappropriate secretion of antidiuretic hormone: Secondary | ICD-10-CM | POA: Insufficient documentation

## 2014-11-26 DIAGNOSIS — G8929 Other chronic pain: Secondary | ICD-10-CM | POA: Diagnosis not present

## 2014-11-26 HISTORY — DX: Squamous cell carcinoma of skin, unspecified: C44.92

## 2014-11-26 LAB — CBC WITH DIFFERENTIAL/PLATELET
Basophils Absolute: 0 10*3/uL (ref 0.0–0.1)
Basophils Relative: 0 % (ref 0–1)
EOS PCT: 4 % (ref 0–5)
Eosinophils Absolute: 0.2 10*3/uL (ref 0.0–0.7)
HEMATOCRIT: 38.2 % (ref 36.0–46.0)
HEMOGLOBIN: 12.7 g/dL (ref 12.0–15.0)
LYMPHS PCT: 28 % (ref 12–46)
Lymphs Abs: 1.6 10*3/uL (ref 0.7–4.0)
MCH: 31.4 pg (ref 26.0–34.0)
MCHC: 33.2 g/dL (ref 30.0–36.0)
MCV: 94.6 fL (ref 78.0–100.0)
MONO ABS: 0.7 10*3/uL (ref 0.1–1.0)
MONOS PCT: 12 % (ref 3–12)
MPV: 10.2 fL (ref 8.6–12.4)
NEUTROS ABS: 3.2 10*3/uL (ref 1.7–7.7)
Neutrophils Relative %: 56 % (ref 43–77)
Platelets: 159 10*3/uL (ref 150–400)
RBC: 4.04 MIL/uL (ref 3.87–5.11)
RDW: 14.6 % (ref 11.5–15.5)
WBC: 5.7 10*3/uL (ref 4.0–10.5)

## 2014-11-26 LAB — COMPREHENSIVE METABOLIC PANEL
ALBUMIN: 4.1 g/dL (ref 3.6–5.1)
ALT: 19 U/L (ref 6–29)
AST: 19 U/L (ref 10–35)
Alkaline Phosphatase: 89 U/L (ref 33–130)
BUN: 13 mg/dL (ref 7–25)
CHLORIDE: 105 mmol/L (ref 98–110)
CO2: 29 mmol/L (ref 20–31)
CREATININE: 0.6 mg/dL (ref 0.50–0.99)
Calcium: 9.4 mg/dL (ref 8.6–10.4)
Glucose, Bld: 84 mg/dL (ref 65–99)
Potassium: 5 mmol/L (ref 3.5–5.3)
SODIUM: 141 mmol/L (ref 135–146)
Total Bilirubin: 0.6 mg/dL (ref 0.2–1.2)
Total Protein: 6 g/dL — ABNORMAL LOW (ref 6.1–8.1)

## 2014-11-26 LAB — LIPID PANEL
CHOL/HDL RATIO: 6.3 ratio — AB (ref <=5.0)
Cholesterol: 220 mg/dL — ABNORMAL HIGH (ref 125–200)
HDL: 35 mg/dL — AB (ref >=46)
LDL Cholesterol: 113 mg/dL (ref <130)
Triglycerides: 358 mg/dL — ABNORMAL HIGH (ref <150)
VLDL: 72 mg/dL — ABNORMAL HIGH (ref <30)

## 2014-11-26 LAB — HM MAMMOGRAPHY: HM MAMMO: NEGATIVE

## 2014-11-26 MED ORDER — ESTRADIOL 0.5 MG PO TABS: 0.5000 mg | ORAL_TABLET | Freq: Every day | ORAL | Status: DC

## 2014-11-26 MED ORDER — FLUTICASONE PROPIONATE 50 MCG/ACT NA SUSP: NASAL | Status: DC

## 2014-11-26 MED ORDER — BECLOMETHASONE DIPROPIONATE 80 MCG/ACT IN AERS: 1.0000 | INHALATION_SPRAY | Freq: Two times a day (BID) | RESPIRATORY_TRACT | Status: DC

## 2014-11-26 MED ORDER — ALBUTEROL SULFATE HFA 108 (90 BASE) MCG/ACT IN AERS: 2.0000 | INHALATION_SPRAY | Freq: Four times a day (QID) | RESPIRATORY_TRACT | Status: DC | PRN

## 2014-11-26 NOTE — Progress Notes (Signed)
Chief Complaint  Patient presents with  . Annual Exam    fasting annual with pelvic/med check. Did not do eye exam as she had one earlier this month with Dr.Weaver @ [facility]. No concerns other than she thinks she has a sinus infection today.    Beverly [name] is a 69 y.o. female who presents for annual wellness visit, physical exam, and follow-up on chronic medical conditions.  She has the following concerns:  She is complaining of URI symptoms (see below, under allergies). She just got back fom a 9 day mission trip in [place] ([place] was hitting a different area of [place], but they had a lot of rain, some wind).  She is complaining of hot flashes.  She needs a fan at work, hair gets soaked, is embarrassing.  She tried black cohosh and other OTC's without any improvement.  She previously was on the hormone patch ([place], per chart review), which was stopped 5 years ago.  She would like to restart hormone replacement to help with these symptoms.  GERD: Doing well on Nexium. She has recurrent heartburn if she misses a pill, or if she eats too late at night (rarely happens). Denies dysphagia.  Depression: Doing well on Paxil. This is prescribed by Dr. Hardin Negus. She is more engaged in the evenings since her dose was increased from 20mg  to 30mg  about a year ago (never had problems with being involved/engaged at work).   Asthma: Only needs to use rescue inhaler when she has an illness/URI.Recently needed when had a cold and was in [place] with bad air pollution. Uses Qvar and Singulair regularly without side effects. She had spirometry (normal) in 04/2014.  Allergies: She stays on zyrtec, flonase and singulair through the Spring and Fall. Currently are allergies are well controlled.She currently has some drainage and congestion. Nasal mucus is starting to turn yellow. She hasn't been using any OTC medications for the URI.    Hyperlipidemia follow-up: Patient is reportedly following  a low-fat, low cholesterol diet. Compliant with medications and denies medication side effects. Changed from simvastatin to atorvastatin 6 months ago, and denies any side effects. She stopped taking fish oil (caused too much gas).  Immunization History  Administered Date(s) Administered  . Hepatitis A 06/17/2006, 01/05/2007  . IPV 04/15/2012  . Influenza Split 10/31/2013  . Influenza-Unspecified 11/09/2014  . Pneumococcal Conjugate-13 07/20/2013  . Pneumococcal Polysaccharide-23 06/17/2006  . Tdap 09/16/2004, 10/08/2014  . Typhoid Live 06/17/2011  . Yellow Fever 07/08/2012  . Zoster 04/04/2009   She gets flu shots annually at work  Last Pap smear: 1997 (s/p hysterectomy)  Last mammogram: 11/2013, scheduled for today Last colonoscopy: 01/2012, Dr. Rich Fuchs diverticulosis; repeat in 10 years Last DEXA: 11/2013 T-1.2 (signific decline over 11 years, [facility] rec recheck 2 yrs, I feel can be longer) Dentist: twice yearly  Ophtho: yearly (Dr. Kathlen Mody at [facility] earlier this month) Exercise: minimal   Other doctors caring for patient include: Doctors caring for pt include Dr. Nicholaus Bloom (pain) Dr. Toy Cookey (dentist) Dr. Denna Haggard (derm) Dr. Kathlen Mody (at [facility] ophtho) Dr. Maxie Better (ortho) Dr. Deatra Ina (GI)  Depression, Fall and Functional Status screens were performed--see Epic.  Notable only for some falls while in [place] (slipped on wet tiles, no significant injuries.  End of Life Discussion:  Patient has a living will and medical power of attorney  Past Medical History  Diagnosis Date  . DJD (degenerative joint disease)   . Chronic pain   . Back pain  Dr Joline Salt 02/2010-epidural injection x 2 at L4-5 with good effect  . Asthma   . GERD (gastroesophageal reflux disease)   . Allergy   . HLD (hyperlipidemia)     hypertriglyceridemia  . Osteoarthritis     feet,shoulder,neck,back,hips and hands.  . IBS (irritable bowel syndrome)   . Internal hemorrhoids 1998  .  Diverticulosis of colon 1998    mild  . Duodenal ulcer     h/o  . BCC (basal cell carcinoma of skin)     Dr. Denna Haggard  . History of sinus bradycardia   . Mitral regurgitation     mild  . Vitamin D deficiency     mild  . History of SCC (squamous cell carcinoma) of skin     Dr. Denna Haggard  . Cataract     left  . Depression   . Ocular migraine 10/31/14    Dr.Hecker    Past Surgical History  Procedure Laterality Date  . Cholecystectomy  1992  . Wisdom tooth extraction    . Tonsillectomy  age 69  . Neck surgery  1989    c6-7 cervical laminectomy and diskecotmy  . Shoulder surgery  2/05    left rotator cuff repair  . Total abdominal hysterectomy w/ bilateral salpingoophorectomy  1997    fibroids  . Hip surgery      right bursectomy x 3  . Inguinal hernia repair  9/09    bilat  . Carpal tunnel release  1989    bilateral  . Epidural steroid injection, back  02/2010  . Colonoscopy  2006  . Tubal ligation  1986  . Cesarean section      x2  . Bunionectomy      R 12/08, L 2004 (Dr. Janus Molder)  . Colonoscopy  01/2012    due again 01/2022; mild diverticulosis  . Cataract extraction Left 08/2012    Dr.Hecker    Social History   Social History  . Marital Status: Divorced    Spouse Name: N/A  . Number of Children: 2  . Years of Education: N/A   Occupational History  . STAFF NURSE    Social History Main Topics  . Smoking status: Never Smoker   . Smokeless tobacco: Never Used  . Alcohol Use: 4.2 oz/week    7 Glasses of wine per week     Comment: 1 glass of wine daily.  . Drug Use: No  . Sexual Activity: Not Currently   Other Topics Concern  . Not on file   Social History Narrative   Divorced, lives alone, [place]; exercises 1 day per week (mowing grass).  Nurse at [employer].  Son lives in [place], daughter lives in [place]    Family History  Problem Relation Age of Onset  . Depression Mother   . Schizophrenia Mother   . Cancer Father     oral  . Heart disease  Father   . Cancer Sister     skin and lung  . COPD Sister   . Hypertension Sister   . Hyperthyroidism Brother   . Cancer Brother     metastatic cancer to bone (?primary)  . Hyperlipidemia Daughter   . Asthma Son   . Diabetes Maternal Grandmother   . Cancer Paternal Grandmother 74    colon cancer  . Colon cancer Paternal Grandmother     Outpatient Encounter Prescriptions as of 11/26/2014  Medication Sig Note  . acetaminophen (TYLENOL) 500 MG tablet Take 1,000 mg by mouth as needed.   Marland Kitchen  albuterol (PROVENTIL HFA;VENTOLIN HFA) 108 (90 BASE) MCG/ACT inhaler Inhale 2 puffs into the lungs every 6 (six) hours as needed for wheezing. 11/26/2014: Needed last week (while in [place], bad air pollution, and she has URI)  . atorvastatin (LIPITOR) 40 MG tablet TAKE 1 TABLET (40 MG TOTAL) BY MOUTH DAILY.   . beclomethasone (QVAR) 80 MCG/ACT inhaler Inhale 1 puff into the lungs 2 (two) times daily.   . Calcium Carbonate-Vitamin D (CALCIUM 600+D) 600-200 MG-UNIT TABS Take 1 tablet by mouth 2 (two) times daily.     . celecoxib (CELEBREX) 200 MG capsule TAKE 1 CAPSULE BY MOUTH TWICE DAILY   . cetirizine (ZYRTEC) 10 MG tablet Take 10 mg by mouth daily.     . chloroquine (ARALEN) 250 MG tablet  11/26/2014: Was in [place] last week (will continue 4 wks after trip)  . esomeprazole (NEXIUM) 40 MG capsule TAKE 1 CAPSULE BY MOUTH DAILY BEFORE SUPPER.   . fentaNYL (DURAGESIC - DOSED MCG/HR) 25 MCG/HR Place 1 patch onto the skin every 3 (three) days.     . fluticasone (FLONASE) 50 MCG/ACT nasal spray PLACE 2 SPRAYS INTO THE NOSE DAILY.   Marland Kitchen gabapentin (NEURONTIN) 800 MG tablet Take 800 mg by mouth at bedtime.    Marland Kitchen HYDROcodone-acetaminophen (NORCO/VICODIN) 5-325 MG per tablet Take 1 tablet by mouth every 6 (six) hours as needed. pain 11/26/2014: Uses prn breakthrough pain (needed some recently, but not regularly)  . methocarbamol (ROBAXIN) 500 MG tablet Take 500 mg by mouth every 8 (eight) hours as needed for muscle  spasms. 07/20/2013: Takes 1 BID regularly (from Dr. Hardin Negus)  . montelukast (SINGULAIR) 10 MG tablet TAKE 1 TABLET BY MOUTH AT BEDTIME.   . Multiple Vitamins-Minerals (MULTIVITAMIN WITH MINERALS) tablet Take 1 tablet by mouth daily.   Marland Kitchen PARoxetine (PAXIL) 30 MG tablet  10/02/2014: Received from: External Pharmacy  . [DISCONTINUED] ciprofloxacin (CIPRO) 500 MG tablet  10/02/2014: Received from: External Pharmacy  . [DISCONTINUED] phenylephrine (SUDAFED PE) 10 MG TABS Take 10 mg by mouth every 4 (four) hours as needed. 07/20/2013: Uses prn, not now   No facility-administered encounter medications on file as of 11/26/2014.    Allergies  Allergen Reactions  . Adhesive [Tape] Rash  . Codeine Rash   ROS: The patient denies anorexia, fever, weight changes, headaches, vision changes, decreased hearing, ear pain, sore throat, breast concerns, chest pain, palpitations, dizziness, syncope, dyspnea on exertion, swelling, nausea, vomiting, diarrhea, constipation, abdominal pain, melena, hematochezia, indigestion/heartburn, hematuria, dysuria, vaginal bleeding, discharge, odor or itch, genital lesions, numbness, tingling, weakness, tremor, suspicious skin lesions, depression, anxiety, abnormal bleeding/bruising, or enlarged lymph nodes.  +chronic back pain. Pain is well controlled. She has morning hand stiffness, and hip stiffness after sitting. Some trouble sleeping at night related to some pain in back with position changes. Gets back to sleep easily. Sees Dr. Denna Haggard regularly for skin checks (twice yearly, has upcoming appts--had BCC's last year). Very slight stress urinary incontinence with cough, only while at work, when she can't void as frequently. +hot flashes/sweats.   PHYSICAL EXAM:  BP 130/84 mmHg  Pulse 68  Temp(Src) 98.1 F (36.7 C) (Tympanic)  Ht 5' 5.5" (1.664 m)  Wt 165 lb 3.2 oz (74.934 kg)  BMI 27.06 kg/m2  General Appearance:  Alert, cooperative, no distress, appears stated age    Head:  Normocephalic, without obvious abnormality, atraumatic   Eyes:  PERRL, conjunctiva/corneas clear, EOM's intact, fundi  Benign. Left eye is brown, right is blue.  Ears:  Normal  TM's and external ear canals   Nose:  Nares normal, mucosa is mildly edematous, no erythema, drainage or sinus tenderness   Throat:  Lips, mucosa, and tongue normal; teeth and gums normal   Neck:  Supple, no lymphadenopathy; thyroid: no enlargement/tenderness/nodules; no carotid  bruit or JVD   Back:  Spine nontender, no curvature, ROM normal, no CVA tenderness   Lungs:  Clear to auscultation bilaterally without wheezes, rales or ronchi; respirations unlabored   Chest Wall:  No tenderness or deformity   Heart:  Regular rate and rhythm, S1 and S2 normal, no murmur, rub  or gallop   Breast Exam:  No tenderness, masses, or nipple discharge or inversion. No axillary lymphadenopathy   Abdomen:  Soft, non-tender, nondistended, normoactive bowel sounds,  no masses, no hepatosplenomegaly   Genitalia:  Normal external genitalia without lesions. Cyst present inside L introitus, soft, minimally tender (unchanged). No vaginal discharge. Uterus surgically absent. No palpable adnexa or masses.   Rectal:  Normal tone, no masses or tenderness; no stool in vault for heme-testing  Extremities:  No clubbing, cyanosis or edema   Pulses:  2+ and symmetric all extremities   Skin:  Skin color, texture, turgor normal, no rashes. She has just a few AK's on hands/forearms.  Lymph nodes:  Cervical, supraclavicular, and axillary nodes normal   Neurologic:  CNII-XII intact, normal strength, sensation and gait; reflexes 2+ and symmetric throughout    Psych: Normal mood, affect, hygiene and grooming        ASSESSMENT/PLAN:  Annual physical exam - Plan: Hepatitis C antibody, Lipid panel, Comprehensive metabolic panel, CBC with Differential/Platelet, TSH  Immunization  due - Plan: Pneumococcal polysaccharide vaccine 23-valent greater than or equal to 2yo subcutaneous/IM  Gastroesophageal reflux disease, esophagitis presence not specified - controlled; continue PPI  Asthma in remission - slight flare with current URI. Continue albuterol prn - Plan: albuterol (PROVENTIL HFA;VENTOLIN HFA) 108 (90 BASE) MCG/ACT inhaler, beclomethasone (QVAR) 80 MCG/ACT inhaler  Allergic rhinitis, unspecified allergic rhinitis type - Plan: fluticasone (FLONASE) 50 MCG/ACT nasal spray  Mixed hyperlipidemia - Plan: Lipid panel, Comprehensive metabolic panel  Chronic back pain - controlled per Dr. Hardin Negus  Postmenopausal symptoms - failed OTC/herbal measures. Desires restart HRT.  Risks/benefits reviewed in detail - Plan: estradiol (ESTRACE) 0.5 MG tablet  Other fatigue - Plan: Comprehensive metabolic panel, CBC with Differential/Platelet, TSH  Need for hepatitis C screening test - Plan: Hepatitis C antibody  Medication monitoring encounter - Plan: Lipid panel, Comprehensive metabolic panel, CBC with Differential/Platelet  Medicare annual wellness visit, initial   Last lipids TG somewhat elevated (190's), low hDL, ratio 4.4. Recheck today. Low fat diet reviewed, exercise, fish oil 1 BID  Full code, full care.  Advised to get [place] copies of her Living Will and Healthcare POA.  Discussed monthly self breast exams and yearly mammograms; at least 30 minutes of aerobic activity at least 5 days/week, weight-bearing exercise at least 2x/wk; proper sunscreen use reviewed; healthy diet, including goals of calcium and vitamin D intake and alcohol recommendations (less than or equal to 1 drink/day) reviewed; regular seatbelt use; changing batteries in smoke detectors. Immunization recommendations discussed--pneumovax today. Colonoscopy recommendations reviewed, UTD (due again 2023).  DEXA--only slight decline over 11 year time frame, and going back on HRT.  I do not think she needs it  repeated next year (2 years from last), can wait longer interval.  F/u 6 mos for med check, sooner prn.   Medicare Attestation I have personally reviewed: The patient's  medical and social history Their use of alcohol, tobacco or illicit drugs Their current medications and supplements The patient's functional ability including ADLs,fall risks, home safety risks, cognitive, and hearing and visual impairment Diet and physical activities Evidence for depression or mood disorders  The patient's weight, height, and BMI  have been recorded in the chart.  I have made referrals, counseling, and provided education to the patient based on review of the above and I have provided the patient with a written personalized care plan for preventive services.     Champion Corales A, MD   11/26/2014

## 2014-11-26 NOTE — Patient Instructions (Addendum)
HEALTH MAINTENANCE RECOMMENDATIONS:  It is recommended that you get at least 30 minutes of aerobic exercise at least 5 days/week (for weight loss, you may need as much as 60-90 minutes). This can be any activity that gets your heart rate up. This can be divided in 10-15 minute intervals if needed, but try and build up your endurance at least once a week.  Weight bearing exercise is also recommended twice weekly.  Eat a healthy diet with lots of vegetables, fruits and fiber.  "Colorful" foods have a lot of vitamins (ie green vegetables, tomatoes, red peppers, etc).  Limit sweet tea, regular sodas and alcoholic beverages, all of which has a lot of calories and sugar.  Up to 1 alcoholic drink daily may be beneficial for women (unless trying to lose weight, watch sugars).  Drink a lot of water.  Calcium recommendations are 1200-1500 mg daily (1500 mg for postmenopausal women or women without ovaries), and vitamin D 1000 IU daily.  This should be obtained from diet and/or supplements (vitamins), and calcium should not be taken all at once, but in divided doses.  Monthly self breast exams and yearly mammograms for women over the age of 44 is recommended.  Sunscreen of at least SPF 30 should be used on all sun-exposed parts of the skin when outside between the hours of 10 am and 4 pm (not just when at beach or pool, but even with exercise, golf, tennis, and yard work!)  Use a sunscreen that says "broad spectrum" so it covers both UVA and UVB rays, and make sure to reapply every 1-2 hours.  Remember to change the batteries in your smoke detectors when changing your clock times in the spring and fall.  Use your seat belt every time you are in a car, and please drive safely and not be distracted with cell phones and texting while driving.   Beverly [name] , Thank you for taking time to come for your Medicare Wellness Visit. I appreciate your ongoing commitment to your health goals. Please review the  following plan we discussed and let me know if I can assist you in the future.   These are the goals we discussed: Goals    None      This is a list of the screening recommended for you and due dates:  Health Maintenance  Topic Date Due  .  Hepatitis C: One time screening is recommended by Center for Disease Control  (CDC) for  adults born from [birth year] through 1963.   07-26-1945  . Pneumonia vaccines (2 of 2 - PPSV23) 07/21/2014  . Flu Shot  09/17/2014  . Mammogram  11/18/2015  . Colon Cancer Screening  01/21/2022  . Tetanus Vaccine  10/07/2024  . DEXA scan (bone density measurement)  Completed  . Shingles Vaccine  Completed   Hepatitis C screen ordered for today. First pneumovax was prior to age of 40 so booster was given today (now course is complete) You have had your flu shot. I agree with getting mammogram today (2017 date above is based on guideline for every 2 years; I recommend yearly)    We will restart an estrogen replacement to help with your hot flashes, since the OTC measures weren't effective.  If you don't like this pill, call me to have it changed back to the patch.  If the pill is extremely effective, you can always try lowering the dose (1/2 tablet).  Daily exercise, limiting the sweets/sugar/fat in the diet will help the  cholesterol o erall.  Consider re-trying fish oil 1 capsule twice daily. Your HDL was low, and TG were high on last check in June.

## 2014-11-27 LAB — HEPATITIS C ANTIBODY: HCV AB: NEGATIVE

## 2014-11-27 LAB — TSH: TSH: 1.483 u[IU]/mL (ref 0.350–4.500)

## 2014-11-29 ENCOUNTER — Other Ambulatory Visit: Payer: Self-pay | Admitting: *Deleted

## 2014-11-29 MED ORDER — OMEGA-3-ACID ETHYL ESTERS 1 G PO CAPS: 2.0000 g | ORAL_CAPSULE | Freq: Two times a day (BID) | ORAL | Status: DC

## 2014-12-06 ENCOUNTER — Telehealth: Payer: Self-pay | Admitting: Family Medicine

## 2014-12-10 NOTE — Telephone Encounter (Signed)
P.A. Estradiol denied, pt must have trial and failure of Citalopram, fluoxetine, or venlafaxine. Do you want to switch?

## 2014-12-10 NOTE — Telephone Encounter (Signed)
She is already on paroxetine (paxil), so these would be inappropriate.  Resubmit with the info that she is already on an SSRI (the same med class of drugs listed), and still having problems.  I don't think there would be substantial benefit of switching her SSRI, and might cause psych problems during a transition of meds just for this reason.

## 2014-12-17 ENCOUNTER — Encounter: Payer: Self-pay | Admitting: Family Medicine

## 2014-12-17 NOTE — Telephone Encounter (Signed)
Appeal letter typed and faxed to Appeal Dept t# [phone]

## 2014-12-25 NOTE — Telephone Encounter (Signed)
Appeal approved til 12/18/15, faxed pharmacy, pt infromed

## 2015-01-03 ENCOUNTER — Encounter: Payer: Self-pay | Admitting: Family Medicine

## 2015-01-08 ENCOUNTER — Telehealth: Payer: Self-pay | Admitting: Family Medicine

## 2015-01-08 DIAGNOSIS — G453 Amaurosis fugax: Secondary | ICD-10-CM

## 2015-01-08 NOTE — Telephone Encounter (Signed)
Pt called to see if Dr Tomi Bamberger has received note from her eye doctor, Dr. Kathlen Mody. Pt states she had a blindness episode in her right eye and saw Dr Kathlen Mody on 12/25/14 and he made some follow up recommendations that patient wanted Dr Tomi Bamberger to see and follow up on

## 2015-01-08 NOTE — Telephone Encounter (Signed)
Advise pt--I got notes only from September visit with Dr. Kathlen Mody, but I got lab results from earlier this month, all of which was normal.  I don't see any scanned visit notes from that date (so they either didn't get received, or are somewhere to be scanned?). Please try and get notes from 11/8

## 2015-01-09 ENCOUNTER — Other Ambulatory Visit: Payer: Self-pay | Admitting: *Deleted

## 2015-01-09 DIAGNOSIS — G453 Amaurosis fugax: Secondary | ICD-10-CM

## 2015-01-09 NOTE — Telephone Encounter (Signed)
Left message for patient to return my call to give her appt info for the echo and carotid.

## 2015-01-09 NOTE — Telephone Encounter (Signed)
Notes reviewed. She had Amaurosis Fugax OD (right eye). Needs further w/u with carotid ultrasound and echo. Please arrange for these

## 2015-01-09 NOTE — Telephone Encounter (Signed)
Called and got OV-in your folder on your shelf.

## 2015-01-17 ENCOUNTER — Ambulatory Visit (HOSPITAL_COMMUNITY)
Admission: RE | Admit: 2015-01-17 | Discharge: 2015-01-17 | Disposition: A | Discharge status: Home / Self Care | Payer: Medicare Other | Source: Ambulatory Visit | Attending: Family Medicine | Admitting: Family Medicine

## 2015-01-17 ENCOUNTER — Other Ambulatory Visit (HOSPITAL_COMMUNITY): Payer: Self-pay

## 2015-01-17 ENCOUNTER — Encounter: Payer: Self-pay | Admitting: Family Medicine

## 2015-01-17 DIAGNOSIS — H3400 Transient retinal artery occlusion, unspecified eye: Secondary | ICD-10-CM | POA: Diagnosis not present

## 2015-01-17 DIAGNOSIS — I313 Pericardial effusion (noninflammatory): Secondary | ICD-10-CM | POA: Diagnosis not present

## 2015-01-17 DIAGNOSIS — I6523 Occlusion and stenosis of bilateral carotid arteries: Secondary | ICD-10-CM | POA: Diagnosis not present

## 2015-01-17 DIAGNOSIS — E785 Hyperlipidemia, unspecified: Secondary | ICD-10-CM | POA: Insufficient documentation

## 2015-01-17 DIAGNOSIS — G453 Amaurosis fugax: Secondary | ICD-10-CM

## 2015-01-17 DIAGNOSIS — I340 Nonrheumatic mitral (valve) insufficiency: Secondary | ICD-10-CM | POA: Insufficient documentation

## 2015-01-17 NOTE — Progress Notes (Signed)
  Echocardiogram 2D Echocardiogram has been performed.  Beverly [name] 01/17/2015, 10:51 AM

## 2015-01-17 NOTE — Progress Notes (Signed)
Vascular Lab Preliminary Results  Bilateral carotid artery duplex completed. 1-39% ICA stenosis. Vertebral artery flow is antegrade.

## 2015-01-30 ENCOUNTER — Other Ambulatory Visit: Payer: Self-pay | Admitting: Family Medicine

## 2015-01-30 NOTE — Telephone Encounter (Signed)
Is this okay to refill? 

## 2015-01-30 NOTE — Telephone Encounter (Signed)
Last filled for 90d supply 9/26.  So, is just a little early.  Labs UTD, so ok to refill when due

## 2015-01-30 NOTE — Telephone Encounter (Signed)
Left message for pt to return my call.

## 2015-01-31 NOTE — Telephone Encounter (Signed)
Will send on 02/07/15 as I will not be here on the 26th.

## 2015-02-06 ENCOUNTER — Other Ambulatory Visit: Payer: Self-pay | Admitting: *Deleted

## 2015-02-06 MED ORDER — CELECOXIB 200 MG PO CAPS: 200.0000 mg | ORAL_CAPSULE | Freq: Two times a day (BID) | ORAL | Status: DC

## 2015-02-07 ENCOUNTER — Other Ambulatory Visit: Payer: Self-pay | Admitting: Family Medicine

## 2015-02-14 ENCOUNTER — Other Ambulatory Visit: Payer: Self-pay | Admitting: Surgery

## 2015-02-14 ENCOUNTER — Other Ambulatory Visit: Payer: Self-pay | Admitting: Family Medicine

## 2015-02-14 ENCOUNTER — Telehealth: Payer: Self-pay | Admitting: Family Medicine

## 2015-02-14 DIAGNOSIS — G453 Amaurosis fugax: Secondary | ICD-10-CM

## 2015-02-14 DIAGNOSIS — R42 Dizziness and giddiness: Secondary | ICD-10-CM

## 2015-02-14 NOTE — Telephone Encounter (Signed)
Rcvd refill request for 90 day supply (#360) Omega 3 Ethyl Esters 1 GM

## 2015-02-14 NOTE — Telephone Encounter (Signed)
I received this as a paper fax yesterday, and addressed it (on the paper) and is in one of the red envelopes.  Please make sure it gets done.

## 2015-02-15 ENCOUNTER — Other Ambulatory Visit: Payer: Self-pay

## 2015-02-15 MED ORDER — OMEGA-3-ACID ETHYL ESTERS 1 G PO CAPS: 2.0000 g | ORAL_CAPSULE | Freq: Two times a day (BID) | ORAL | Status: DC

## 2015-02-15 NOTE — Telephone Encounter (Signed)
Amber done this

## 2015-02-18 ENCOUNTER — Ambulatory Visit (INDEPENDENT_AMBULATORY_CARE_PROVIDER_SITE_OTHER): Payer: Medicare Other | Admitting: Emergency Medicine

## 2015-02-18 VITALS — BP 138/76 | HR 48 | Temp 97.8°F | Resp 18 | Ht 64.75 in | Wt 160.0 lb

## 2015-02-18 DIAGNOSIS — J209 Acute bronchitis, unspecified: Secondary | ICD-10-CM | POA: Diagnosis not present

## 2015-02-18 DIAGNOSIS — J0140 Acute pansinusitis, unspecified: Secondary | ICD-10-CM | POA: Diagnosis not present

## 2015-02-18 MED ORDER — AMOXICILLIN-POT CLAVULANATE 875-125 MG PO TABS: 1.0000 | ORAL_TABLET | Freq: Two times a day (BID) | ORAL | Status: DC

## 2015-02-18 MED ORDER — HYDROCOD POLST-CPM POLST ER 10-8 MG/5ML PO SUER: 5.0000 mL | Freq: Two times a day (BID) | ORAL | Status: DC

## 2015-02-18 MED ORDER — PSEUDOEPHEDRINE-GUAIFENESIN ER 60-600 MG PO TB12: 1.0000 | ORAL_TABLET | Freq: Two times a day (BID) | ORAL | Status: DC

## 2015-02-18 MED FILL — MUCINEX D ER TABLET: 60-600 | 9 days supply | Qty: 18 | Fill #0

## 2015-02-18 MED FILL — AMOX TR-K CLV 875-125 MG TA: 875-125 | 10 days supply | Qty: 20 | Fill #0

## 2015-02-18 MED FILL — HYDROCODONE-CHLORPHENIRAM S: 10-8 | 6 days supply | Qty: 60 | Fill #0

## 2015-02-18 NOTE — Patient Instructions (Signed)

## 2015-02-18 NOTE — Progress Notes (Signed)
Subjective:  Patient ID: [name], female    DOB: 1945-02-19  Age: 70 y.o. MRN: [id number]  CC: Sinusitis and Cough   HPI Beverly [name] presents   Patient has nasal congestion postnasal drainage and nasal discharge. His old discharge is purulent character. She has a cough productive of purulent sputum. No wheezing or shortness of breath noted. She has no fever chills she has no nausea vomiting or stool change. She's had no improvement with over-the-counter medication  History Beverly [name] has a past medical history of DJD (degenerative joint disease); Chronic pain; Back pain; Asthma; GERD (gastroesophageal reflux disease); Allergy; HLD (hyperlipidemia); Osteoarthritis; IBS (irritable bowel syndrome); Internal hemorrhoids (1998); Diverticulosis of colon (1998); Duodenal ulcer; BCC (basal cell carcinoma of skin); History of sinus bradycardia; Mitral regurgitation; Vitamin D deficiency; History of SCC (squamous cell carcinoma) of skin; Cataract; Depression; and Ocular migraine (10/31/14).   She has past surgical history that includes Cholecystectomy (1992); Wisdom tooth extraction; Tonsillectomy (age 48); Neck surgery (1989); Shoulder surgery (2/05); Total abdominal hysterectomy w/ bilateral salpingoophorectomy (1997); Hip surgery; Inguinal hernia repair (9/09); Carpal tunnel release (1989); epidural steroid injection, back (02/2010); Colonoscopy (2006); Tubal ligation (1986); Cesarean section; Bunionectomy; Colonoscopy (01/2012); and Cataract extraction (Left, 08/2012).   Her  family history includes Asthma in her son; COPD in her sister; Cancer in her brother, father, and sister; Cancer (age of onset: 80) in her paternal grandmother; Colon cancer in her paternal grandmother; Depression in her mother; Diabetes in her maternal grandmother; Heart disease in her father; Hyperlipidemia in her daughter; Hypertension in her sister; Hyperthyroidism in her brother; Schizophrenia in her  mother.  She   reports that she has never smoked. She has never used smokeless tobacco. She reports that she drinks about 4.2 oz of alcohol per week. She reports that she does not use illicit drugs.  Outpatient Prescriptions Prior to Visit  Medication Sig Dispense Refill  . acetaminophen (TYLENOL) 500 MG tablet Take 1,000 mg by mouth as needed.    Marland Kitchen albuterol (PROVENTIL HFA;VENTOLIN HFA) 108 (90 BASE) MCG/ACT inhaler Inhale 2 puffs into the lungs every 6 (six) hours as needed for wheezing. 18 g 1  . atorvastatin (LIPITOR) 40 MG tablet TAKE 1 TABLET (40 MG TOTAL) BY MOUTH DAILY. 90 tablet 1  . beclomethasone (QVAR) 80 MCG/ACT inhaler Inhale 1 puff into the lungs 2 (two) times daily. 3 Inhaler 3  . Calcium Carbonate-Vitamin D (CALCIUM 600+D) 600-200 MG-UNIT TABS Take 1 tablet by mouth 2 (two) times daily.      . celecoxib (CELEBREX) 200 MG capsule Take 1 capsule (200 mg total) by mouth 2 (two) times daily. 180 capsule 0  . cetirizine (ZYRTEC) 10 MG tablet Take 10 mg by mouth daily.      Marland Kitchen esomeprazole (NEXIUM) 40 MG capsule TAKE 1 CAPSULE BY MOUTH DAILY BEFORE SUPPER. 90 capsule 0  . estradiol (ESTRACE) 0.5 MG tablet Take 1 tablet (0.5 mg total) by mouth daily. 90 tablet 3  . fentaNYL (DURAGESIC - DOSED MCG/HR) 25 MCG/HR Place 1 patch onto the skin every 3 (three) days.      . fluticasone (FLONASE) 50 MCG/ACT nasal spray PLACE 2 SPRAYS INTO THE NOSE DAILY. 48 g 3  . gabapentin (NEURONTIN) 800 MG tablet Take 800 mg by mouth at bedtime.     . methocarbamol (ROBAXIN) 500 MG tablet Take 500 mg by mouth every 8 (eight) hours as needed for muscle spasms.    . montelukast (SINGULAIR) 10 MG tablet TAKE 1  TABLET BY MOUTH AT BEDTIME. 90 tablet 0  . Multiple Vitamins-Minerals (MULTIVITAMIN WITH MINERALS) tablet Take 1 tablet by mouth daily.    Marland Kitchen omega-3 acid ethyl esters (LOVAZA) 1 g capsule Take 2 capsules (2 g total) by mouth 2 (two) times daily. 360 capsule 1  . PARoxetine (PAXIL) 30 MG tablet     .  chloroquine (ARALEN) 250 MG tablet     . HYDROcodone-acetaminophen (NORCO/VICODIN) 5-325 MG per tablet Take 1 tablet by mouth every 6 (six) hours as needed. pain     No facility-administered medications prior to visit.    Social History   Social History  . Marital Status: Divorced    Spouse Name: N/A  . Number of Children: 2  . Years of Education: N/A   Occupational History  . STAFF NURSE    Social History Main Topics  . Smoking status: Never Smoker   . Smokeless tobacco: Never Used  . Alcohol Use: 4.2 oz/week    7 Glasses of wine per week     Comment: 1 glass of wine daily.  . Drug Use: No  . Sexual Activity: Not Currently   Other Topics Concern  . None   Social History Narrative   Divorced, lives alone, [place]; exercises 1 day per week (mowing grass).  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  Son lives in [place], daughter lives in [place]  Constitutional: Negative for fever, chills and appetite change.  HENT: Positive for congestion, postnasal drip, rhinorrhea and sinus pressure. Negative for ear pain and sore throat.   Eyes: Negative for pain and redness.  Respiratory: Positive for cough. Negative for shortness of breath and wheezing.   Cardiovascular: Negative for leg swelling.  Gastrointestinal: Negative for nausea, vomiting, abdominal pain, diarrhea, constipation and blood in stool.  Endocrine: Negative for polyuria.  Genitourinary: Negative for dysuria, urgency, frequency and flank pain.  Musculoskeletal: Negative for gait problem.  Skin: Negative for rash.  Neurological: Negative for weakness and headaches.  Psychiatric/Behavioral: Negative for confusion and decreased concentration. The patient is not nervous/anxious.     Objective:  BP 138/76 mmHg  Pulse 48  Temp(Src) 97.8 F (36.6 C) (Oral)  Resp 18  Ht 5' 4.75" (1.645 m)  Wt 160 lb (72.576 kg)  BMI 26.82 kg/m2  SpO2 97%  Physical Exam  Constitutional: She is oriented to person, place, and  time. She appears well-developed and well-nourished. No distress.  HENT:  Head: Normocephalic and atraumatic.  Right Ear: External ear normal.  Left Ear: External ear normal.  Nose: Nose normal.  Eyes: Conjunctivae and EOM are normal. Pupils are equal, round, and reactive to light. No scleral icterus.  Neck: Normal range of motion. Neck supple. No tracheal deviation present.  Cardiovascular: Normal rate, regular rhythm and normal heart sounds.   Pulmonary/Chest: Effort normal. No respiratory distress. She has no wheezes. She has no rales.  Abdominal: She exhibits no mass. There is no tenderness. There is no rebound and no guarding.  Musculoskeletal: She exhibits no edema.  Lymphadenopathy:    She has no cervical adenopathy.  Neurological: She is alert and oriented to person, place, and time. Coordination normal.  Skin: Skin is warm and dry. No rash noted.  Psychiatric: She has a normal mood and affect. Her behavior is normal.      Assessment & Plan:   Sarinity was seen today for sinusitis and cough.  Diagnoses and all orders for this visit:  Acute bronchitis, unspecified organism  Acute pansinusitis, recurrence not specified  Other orders -     amoxicillin-clavulanate (AUGMENTIN) 875-125 MG tablet; Take 1 tablet by mouth 2 (two) times daily. -     pseudoephedrine-guaifenesin (MUCINEX D) 60-600 MG 12 hr tablet; Take 1 tablet by mouth every 12 (twelve) hours. -     chlorpheniramine-HYDROcodone (TUSSIONEX PENNKINETIC ER) 10-8 MG/5ML SUER; Take 5 mLs by mouth 2 (two) times daily.  I have discontinued Ms. Egner HYDROcodone-acetaminophen and chloroquine. I am also having her start on amoxicillin-clavulanate, pseudoephedrine-guaifenesin, and chlorpheniramine-HYDROcodone. Additionally, I am having her maintain her fentaNYL, Calcium Carbonate-Vitamin D, cetirizine, gabapentin, acetaminophen, multivitamin with minerals, methocarbamol, PARoxetine, estradiol, fluticasone, albuterol,  beclomethasone, esomeprazole, celecoxib, montelukast, atorvastatin, and omega-3 acid ethyl esters.  Meds ordered this encounter  Medications  . amoxicillin-clavulanate (AUGMENTIN) 875-125 MG tablet    Sig: Take 1 tablet by mouth 2 (two) times daily.    Dispense:  20 tablet    Refill:  0  . pseudoephedrine-guaifenesin (MUCINEX D) 60-600 MG 12 hr tablet    Sig: Take 1 tablet by mouth every 12 (twelve) hours.    Dispense:  18 tablet    Refill:  0  . chlorpheniramine-HYDROcodone (TUSSIONEX PENNKINETIC ER) 10-8 MG/5ML SUER    Sig: Take 5 mLs by mouth 2 (two) times daily.    Dispense:  60 mL    Refill:  0    Appropriate red flag conditions were discussed with the patient as well as actions that should be taken.  Patient expressed his understanding.  Follow-up: Return if symptoms worsen or fail to improve.  Roselee Culver, MD

## 2015-02-24 ENCOUNTER — Ambulatory Visit
Admission: RE | Admit: 2015-02-24 | Discharge: 2015-02-24 | Disposition: A | Discharge status: Home / Self Care | Payer: Medicare Other | Source: Ambulatory Visit | Attending: Surgery | Admitting: Surgery

## 2015-02-24 DIAGNOSIS — R42 Dizziness and giddiness: Secondary | ICD-10-CM

## 2015-02-24 DIAGNOSIS — G453 Amaurosis fugax: Secondary | ICD-10-CM

## 2015-02-24 MED ORDER — GADOBENATE DIMEGLUMINE 529 MG/ML IV SOLN
15.0000 mL | Freq: Once | INTRAVENOUS | Status: AC | PRN
  Administered 2015-02-24: 15 mL via INTRAVENOUS

## 2015-02-25 ENCOUNTER — Inpatient Hospital Stay: Admission: RE | Admit: 2015-02-25 | Payer: Self-pay | Source: Ambulatory Visit

## 2015-03-08 ENCOUNTER — Other Ambulatory Visit: Payer: Self-pay | Admitting: Family Medicine

## 2015-03-08 LAB — LIPID PANEL
Cholesterol: 150 mg/dL (ref 125–200)
HDL: 41 mg/dL — ABNORMAL LOW (ref >=46)
LDL CALC: 74 mg/dL (ref <130)
Total CHOL/HDL Ratio: 3.7 Ratio (ref <=5.0)
Triglycerides: 175 mg/dL — ABNORMAL HIGH (ref <150)
VLDL: 35 mg/dL — ABNORMAL HIGH (ref <30)

## 2015-03-08 MED FILL — METHOCARBAMOL 500 MG TABLET: 500 | 30 days supply | Qty: 90 | Fill #6

## 2015-03-10 ENCOUNTER — Ambulatory Visit (INDEPENDENT_AMBULATORY_CARE_PROVIDER_SITE_OTHER): Payer: Medicare Other | Admitting: Family Medicine

## 2015-03-10 VITALS — BP 116/72 | HR 60 | Temp 98.0°F | Resp 18 | Ht 66.0 in | Wt 163.6 lb

## 2015-03-10 DIAGNOSIS — J0100 Acute maxillary sinusitis, unspecified: Secondary | ICD-10-CM

## 2015-03-10 MED ORDER — LEVOFLOXACIN 500 MG PO TABS: 500.0000 mg | ORAL_TABLET | Freq: Every day | ORAL | Status: DC

## 2015-03-10 NOTE — Progress Notes (Signed)
By signing my name below, I, Moises Blood, attest that this documentation has been prepared under the direction and in the presence of Robyn Haber, MD. Electronically Signed: Moises Blood, Rafael Capo. 03/10/2015 , 2:21 PM .  Patient was seen in room 14 .   Patient ID: Beverly [name] MRN: [id number], DOB: 10/04/1945, 70 y.o. Date of Encounter: 03/10/2015  Primary Physician: Vikki Ports, MD  Chief Complaint:  Chief Complaint  Patient presents with  . Sinusitis    follow up, pt. still not better    HPI:  Beverly [name] is a 70 y.o. female who presents to [facility] for follow up on sinusitis. She was seen 20 days ago by Dr. Ouida Sills. She reports that she was feeling better after taking the augmentin but still isn't better. She has productive cough (yellowish, golden brown). She notes having drainage down the back of her throat and fullness in her head.   She works at the [place] [facility] at [employer], almost 10 years now.   Past Medical History  Diagnosis Date  . DJD (degenerative joint disease)   . Chronic pain   . Back pain     Dr Joline Salt 02/2010-epidural injection x 2 at L4-5 with good effect  . Asthma   . GERD (gastroesophageal reflux disease)   . Allergy   . HLD (hyperlipidemia)     hypertriglyceridemia  . Osteoarthritis     feet,shoulder,neck,back,hips and hands.  . IBS (irritable bowel syndrome)   . Internal hemorrhoids 1998  . Diverticulosis of colon 1998    mild  . Duodenal ulcer     h/o  . BCC (basal cell carcinoma of skin)     Dr. Denna Haggard  . History of sinus bradycardia   . Mitral regurgitation     mild  . Vitamin D deficiency     mild  . History of SCC (squamous cell carcinoma) of skin     Dr. Denna Haggard  . Cataract     left  . Depression   . Ocular migraine 10/31/14    Dr.Hecker     Home Meds: Prior to Admission medications   Medication Sig Start Date End Date Taking? Authorizing Provider  acetaminophen (TYLENOL) 500 MG  tablet Take 1,000 mg by mouth as needed.   Yes Historical Provider, MD  albuterol (PROVENTIL HFA;VENTOLIN HFA) 108 (90 BASE) MCG/ACT inhaler Inhale 2 puffs into the lungs every 6 (six) hours as needed for wheezing. 11/26/14  Yes Rita Ohara, MD  atorvastatin (LIPITOR) 40 MG tablet TAKE 1 TABLET (40 MG TOTAL) BY MOUTH DAILY. 02/14/15  Yes Rita Ohara, MD  beclomethasone (QVAR) 80 MCG/ACT inhaler Inhale 1 puff into the lungs 2 (two) times daily. 11/26/14 04/03/16 Yes Rita Ohara, MD  Calcium Carbonate-Vitamin D (CALCIUM 600+D) 600-200 MG-UNIT TABS Take 1 tablet by mouth 2 (two) times daily.     Yes Historical Provider, MD  celecoxib (CELEBREX) 200 MG capsule Take 1 capsule (200 mg total) by mouth 2 (two) times daily. 02/06/15  Yes Rita Ohara, MD  cetirizine (ZYRTEC) 10 MG tablet Take 10 mg by mouth daily.     Yes Historical Provider, MD  esomeprazole (NEXIUM) 40 MG capsule TAKE 1 CAPSULE BY MOUTH DAILY BEFORE SUPPER. 01/30/15  Yes Rita Ohara, MD  estradiol (ESTRACE) 0.5 MG tablet Take 1 tablet (0.5 mg total) by mouth daily. 11/26/14  Yes Rita Ohara, MD  fentaNYL (DURAGESIC - DOSED MCG/HR) 25 MCG/HR Place 1 patch onto the skin every 3 (three)  days.     Yes Historical Provider, MD  fluticasone (FLONASE) 50 MCG/ACT nasal spray PLACE 2 SPRAYS INTO THE NOSE DAILY. 11/26/14  Yes Rita Ohara, MD  gabapentin (NEURONTIN) 800 MG tablet Take 800 mg by mouth at bedtime.    Yes Historical Provider, MD  methocarbamol (ROBAXIN) 500 MG tablet Take 500 mg by mouth every 8 (eight) hours as needed for muscle spasms.   Yes Historical Provider, MD  montelukast (SINGULAIR) 10 MG tablet TAKE 1 TABLET BY MOUTH AT BEDTIME. 02/07/15  Yes Rita Ohara, MD  Multiple Vitamins-Minerals (MULTIVITAMIN WITH MINERALS) tablet Take 1 tablet by mouth daily.   Yes Historical Provider, MD  omega-3 acid ethyl esters (LOVAZA) 1 g capsule Take 2 capsules (2 g total) by mouth 2 (two) times daily. 02/15/15  Yes Rita Ohara, MD  PARoxetine (PAXIL) 30 MG tablet   07/17/14  Yes Historical Provider, MD  pseudoephedrine-guaifenesin (MUCINEX D) 60-600 MG 12 hr tablet Take 1 tablet by mouth every 12 (twelve) hours. 02/18/15 02/18/16 Yes Roselee Culver, MD  amoxicillin-clavulanate (AUGMENTIN) 875-125 MG tablet Take 1 tablet by mouth 2 (two) times daily. Patient not taking: Reported on 03/10/2015 02/18/15   Roselee Culver, MD  chlorpheniramine-HYDROcodone [facility] ER) 10-8 MG/5ML SUER Take 5 mLs by mouth 2 (two) times daily. Patient not taking: Reported on 03/10/2015 02/18/15   Roselee Culver, MD    Allergies:  Allergies  Allergen Reactions  . Adhesive [Tape] Rash  . Codeine Rash    Social History   Social History  . Marital Status: Divorced    Spouse Name: N/A  . Number of Children: 2  . Years of Education: N/A   Occupational History  . STAFF NURSE    Social History Main Topics  . Smoking status: Never Smoker   . Smokeless tobacco: Never Used  . Alcohol Use: 4.2 oz/week    7 Glasses of wine per week     Comment: 1 glass of wine daily.  . Drug Use: No  . Sexual Activity: Not Currently   Other Topics Concern  . Not on file   Social History Narrative   Divorced, lives alone, [place]; exercises 1 day per week (mowing grass).  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  Son lives in [place], daughter lives in [place]: Constitutional: negative for fever, chills, night sweats, weight changes, or fatigue  HEENT: negative for vision changes, hearing loss, rhinorrhea, ST, epistaxis; positive for postnasal drip, sinus pressure, congestion Cardiovascular: negative for chest pain or palpitations Respiratory: negative for hemoptysis, wheezing, shortness of breath; positive for cough Abdominal: negative for abdominal pain, nausea, vomiting, diarrhea, or constipation Dermatological: negative for rash Neurologic: negative for headache, dizziness, or syncope All other systems reviewed and are otherwise negative with the exception to  those above and in the HPI.  Physical Exam: Blood pressure 116/72, pulse 60, temperature 98 F (36.7 C), temperature source Oral, resp. rate 18, height 5\' 6"  (1.676 m), weight 163 lb 9.6 oz (74.208 kg), SpO2 96 %., Body mass index is 26.42 kg/(m^2). General: Well developed, well nourished, in no acute distress. Head: Normocephalic, atraumatic, eyes without discharge, sclera non-icteric,Bilateral auditory canals clear, TM's are without perforation, pearly grey and translucent with reflective cone of light bilaterally. Oral cavity moist, posterior pharynx without exudate, erythema, peritonsillar abscess, or post nasal drip. Mucosa purulent discharge in nasal passages Neck: Supple. No thyromegaly. Full ROM. No lymphadenopathy. Lungs: Clear bilaterally to auscultation without rales, or rhonchi. Breathing is  unlabored. Expiratory wheezes Heart: RRR with S1 S2. No murmurs, rubs, or gallops appreciated. Msk:  Strength and tone normal for age. Extremities/Skin: Warm and dry. No clubbing or cyanosis. No edema. No rashes or suspicious lesions. Neuro: Alert and oriented X 3. Moves all extremities spontaneously. Gait is normal. CNII-XII grossly in tact. Psych:  Responds to questions appropriately with a normal affect.   Labs:  ASSESSMENT AND PLAN:  70 y.o. year old female with persistent sinusitis  If patient is not feeling better by Wednesday, please let me know.   Acute maxillary sinusitis, recurrence not specified - Plan: levofloxacin (LEVAQUIN) 500 MG tablet  This chart was scribed in my presence and reviewed by me personally.  Signed, Robyn Haber, MD 03/10/2015 2:22 PM

## 2015-03-10 NOTE — Patient Instructions (Signed)
Call me on Wednesday if you are Not feeling better.

## 2015-03-13 ENCOUNTER — Telehealth: Payer: Self-pay

## 2015-03-13 MED ORDER — PREDNISONE 20 MG PO TABS: ORAL_TABLET | ORAL | Status: DC

## 2015-03-13 NOTE — Telephone Encounter (Signed)
Left message Rx sent into the pharmacy.

## 2015-03-13 NOTE — Telephone Encounter (Signed)
PATIENT STATES SHE SAW DR. Joseph Art ON Sunday FOR A SINUS INFECTION. HE TOLD HER IF SHE DID NOT START TO FEEL BETTER TO CALL HIM BACK AND HE WOULD PRESCRIBE HER PREDNISONE. SHE IS STILL WHEEZING AND HAS DRAINAGE AND A PRODUCTIVE COUGH. SHE WOULD LIKE TO GO AHEAD AND GET IT CALLED INTO HER PHARMACY. BEST PHONE [phone] (CELL)  PHARMACY CHOICE IS [facility]   [place]

## 2015-03-14 MED FILL — ESTRADIOL 0.5 MG TABLET: 0.5 | 90 days supply | Qty: 90 | Fill #1

## 2015-03-18 ENCOUNTER — Encounter: Payer: Self-pay | Admitting: Family Medicine

## 2015-03-26 MED FILL — GABAPENTIN 800 MG TABLET: 800 | 90 days supply | Qty: 90 | Fill #0

## 2015-04-01 ENCOUNTER — Institutional Professional Consult (permissible substitution): Payer: Medicare Other | Admitting: Family Medicine

## 2015-04-12 MED FILL — PARoxetine HCL 30 MG TABS: 30 | 90 days supply | Qty: 90 | Fill #2

## 2015-04-23 MED FILL — QVAR 80 MCG ORAL INHALER: 80 | 90 days supply | Qty: 26 | Fill #1

## 2015-04-23 MED FILL — METHOCARBAMOL 500 MG TABLET: 500 | 60 days supply | Qty: 180 | Fill #0

## 2015-04-29 ENCOUNTER — Other Ambulatory Visit: Payer: Self-pay | Admitting: Family Medicine

## 2015-04-29 MED FILL — ESOMEPRAZOLE MAG DR 40 MG C: 40 | 90 days supply | Qty: 90 | Fill #0

## 2015-05-05 ENCOUNTER — Other Ambulatory Visit: Payer: Self-pay | Admitting: Family Medicine

## 2015-05-07 ENCOUNTER — Other Ambulatory Visit: Payer: Self-pay | Admitting: Family Medicine

## 2015-05-08 ENCOUNTER — Encounter: Payer: Self-pay | Admitting: Family Medicine

## 2015-05-08 ENCOUNTER — Other Ambulatory Visit: Payer: Self-pay | Admitting: Family Medicine

## 2015-05-09 MED FILL — MONTELUKAST SOD 10 MG TAB: 10 | 90 days supply | Qty: 90 | Fill #0

## 2015-05-09 MED FILL — CELECOXIB 200 MG CAPSULE: 200 | 90 days supply | Qty: 180 | Fill #0

## 2015-05-15 ENCOUNTER — Encounter: Payer: Self-pay | Admitting: Family Medicine

## 2015-05-15 ENCOUNTER — Telehealth: Payer: Self-pay | Admitting: Family Medicine

## 2015-05-15 ENCOUNTER — Ambulatory Visit (INDEPENDENT_AMBULATORY_CARE_PROVIDER_SITE_OTHER): Payer: Medicare Other | Admitting: Family Medicine

## 2015-05-15 VITALS — BP 120/82 | HR 55 | Ht 64.5 in | Wt 164.0 lb

## 2015-05-15 DIAGNOSIS — J45998 Other asthma: Secondary | ICD-10-CM | POA: Diagnosis not present

## 2015-05-15 DIAGNOSIS — J309 Allergic rhinitis, unspecified: Secondary | ICD-10-CM

## 2015-05-15 DIAGNOSIS — M549 Dorsalgia, unspecified: Secondary | ICD-10-CM

## 2015-05-15 DIAGNOSIS — N959 Unspecified menopausal and perimenopausal disorder: Secondary | ICD-10-CM | POA: Diagnosis not present

## 2015-05-15 DIAGNOSIS — E782 Mixed hyperlipidemia: Secondary | ICD-10-CM

## 2015-05-15 DIAGNOSIS — Z5181 Encounter for therapeutic drug level monitoring: Secondary | ICD-10-CM | POA: Diagnosis not present

## 2015-05-15 DIAGNOSIS — G8929 Other chronic pain: Secondary | ICD-10-CM | POA: Diagnosis not present

## 2015-05-15 DIAGNOSIS — K219 Gastro-esophageal reflux disease without esophagitis: Secondary | ICD-10-CM

## 2015-05-15 LAB — COMPREHENSIVE METABOLIC PANEL
ALK PHOS: 68 U/L (ref 33–130)
ALT: 23 U/L (ref 6–29)
AST: 21 U/L (ref 10–35)
Albumin: 4.2 g/dL (ref 3.6–5.1)
BILIRUBIN TOTAL: 0.4 mg/dL (ref 0.2–1.2)
BUN: 14 mg/dL (ref 7–25)
CALCIUM: 9.3 mg/dL (ref 8.6–10.4)
CO2: 29 mmol/L (ref 20–31)
Chloride: 107 mmol/L (ref 98–110)
Creat: 0.56 mg/dL (ref 0.50–0.99)
GLUCOSE: 99 mg/dL (ref 65–99)
Potassium: 4.8 mmol/L (ref 3.5–5.3)
SODIUM: 142 mmol/L (ref 135–146)
Total Protein: 6.1 g/dL (ref 6.1–8.1)

## 2015-05-15 MED ORDER — OSELTAMIVIR PHOSPHATE 75 MG PO CAPS: 75.0000 mg | ORAL_CAPSULE | Freq: Every day | ORAL | Status: DC

## 2015-05-15 MED FILL — OSELTAMIVIR PHOS 75 MG CAP: 75 | 10 days supply | Qty: 10 | Fill #0

## 2015-05-15 NOTE — Patient Instructions (Signed)
Continue your current medications. Your last Triglycerides were slightly high--continue the Lovaza and lowfat diet, limiting sweets/sugars and fried foods. Have your pharmacy contact us when refills are needed (many will be due in June).

## 2015-05-15 NOTE — Telephone Encounter (Signed)
Patient called, when she returned to work she learned that she has 2 coworkers that have been diagnosed with the flu and they are advising them to Contact their providers for Rx Tamiflu   Please call patient

## 2015-05-15 NOTE — Telephone Encounter (Signed)
Patient prefers the preventative, 75mg  once daily for 10 days-sent to [facility].

## 2015-05-15 NOTE — Telephone Encounter (Signed)
I usually recommend preventative (75mg  once daily x 10d) if close contacts (usually household). She does have asthma, so is at higher risk for complications if develops.  Some people have side effects from the medication, so don't want to take it when well, but start it at earliest onset of symptoms (fever, aches), taking it twice daily x 5 days for treatment.  Okay to rx #10 (to be taken once daily vs BID at onset of symptoms if she prefers, especially if any side effects).

## 2015-05-15 NOTE — Progress Notes (Signed)
Chief Complaint  Patient presents with  . med check    states it is going ok no problems or concerns and is not sure about refills   Postmenopausal symptoms--significantly improved since starting Estrace. Last mammogram was 11/2014.  GERD: Doing well on Nexium. She has recurrent heartburn if she misses a pill, or if she eats too late at night (rarely happens). Denies dysphagia.  Depression: Doing well on Paxil.Has been on the '30mg'$  dose for over a year, and doing well. No side effects.  This is prescribed by Dr. Hardin Negus.   Asthma: Only needs to use rescue inhaler when she has an illness/URI (required a prednisone course and albuterol with illness in January, none since). Uses Qvar and Singulair regularly without side effects.  Allergies: She stays on zyrtec, flonase and singulair through the Spring and Fall. Currently are allergies are well controlled.  Hyperlipidemia follow-up: Patient is reportedly following a low-fat, low cholesterol diet. Compliant with medications and denies medication side effects.On atorvastatin and denies any side effects. She is taking Lovaza--tolerating it much better than fish oil (which caused gas).  Has appt with neuro-ophtho through [facility] (Dr. Jolyn Nap) in April.  She has been having more ophthalmic migraines.    PMH, PSH, SH reviewed  Outpatient Encounter Prescriptions as of 05/15/2015  Medication Sig Note  . atorvastatin (LIPITOR) 40 MG tablet TAKE 1 TABLET (40 MG TOTAL) BY MOUTH DAILY.   . beclomethasone (QVAR) 80 MCG/ACT inhaler Inhale 1 puff into the lungs 2 (two) times daily.   . Calcium Carbonate-Vitamin D (CALCIUM 600+D) 600-200 MG-UNIT TABS Take 1 tablet by mouth 2 (two) times daily.     . celecoxib (CELEBREX) 200 MG capsule TAKE 1 CAPSULE BY MOUTH 2 TIMES DAILY.   . cetirizine (ZYRTEC) 10 MG tablet Take 10 mg by mouth daily.     Marland Kitchen esomeprazole (NEXIUM) 40 MG capsule TAKE 1 CAPSULE BY MOUTH DAILY BEFORE SUPPER.   Marland Kitchen estradiol  (ESTRACE) 0.5 MG tablet Take 1 tablet (0.5 mg total) by mouth daily.   . fentaNYL (DURAGESIC - DOSED MCG/HR) 25 MCG/HR Place 1 patch onto the skin every 3 (three) days.   05/15/2015: 3mg/hour  . fluorouracil (EFUDEX) 5 % cream Apply topically daily.   . fluticasone (FLONASE) 50 MCG/ACT nasal spray PLACE 2 SPRAYS INTO THE NOSE DAILY.   .Marland Kitchengabapentin (NEURONTIN) 800 MG tablet Take 800 mg by mouth at bedtime.    . methocarbamol (ROBAXIN) 500 MG tablet Take 500 mg by mouth every 8 (eight) hours as needed for muscle spasms. 05/15/2015: Takes once daily, in the morning  . montelukast (SINGULAIR) 10 MG tablet TAKE 1 TABLET BY MOUTH AT BEDTIME.   . Multiple Vitamins-Minerals (MULTIVITAMIN WITH MINERALS) tablet Take 1 tablet by mouth daily.   .Marland Kitchenomega-3 acid ethyl esters (LOVAZA) 1 g capsule Take 2 capsules (2 g total) by mouth 2 (two) times daily.   .Marland KitchenPARoxetine (PAXIL) 30 MG tablet  10/02/2014: Received from: External Pharmacy  . acetaminophen (TYLENOL) 500 MG tablet Take 1,000 mg by mouth as needed. Reported on 05/15/2015   . albuterol (PROVENTIL HFA;VENTOLIN HFA) 108 (90 BASE) MCG/ACT inhaler Inhale 2 puffs into the lungs every 6 (six) hours as needed for wheezing. (Patient not taking: Reported on 05/15/2015)   . [DISCONTINUED] chlorpheniramine-HYDROcodone (TUSSIONEX PENNKINETIC ER) 10-8 MG/5ML SUER Take 5 mLs by mouth 2 (two) times daily. (Patient not taking: Reported on 03/10/2015)   . [DISCONTINUED] levofloxacin (LEVAQUIN) 500 MG tablet Take 1 tablet (500 mg total) by  mouth daily. (Patient not taking: Reported on 05/15/2015)   . [DISCONTINUED] predniSONE (DELTASONE) 20 MG tablet Two daily with food (Patient not taking: Reported on 05/15/2015)   . [DISCONTINUED] pseudoephedrine-guaifenesin (MUCINEX D) 60-600 MG 12 hr tablet Take 1 tablet by mouth every 12 (twelve) hours. (Patient not taking: Reported on 05/15/2015)    No facility-administered encounter medications on file as of 05/15/2015.     BP 120/82 mmHg   Pulse 55  Ht 5' 4.5" (1.638 m)  Wt 164 lb (74.39 kg)  BMI 27.73 kg/m2  Well developed, pleasant female, in good spirits HEENT: PERRL, EOMI, conjunctiva and sclera are clear.  TM's and EAC's normal. Nasal mucosa normal, OP is clear, sinuses nontender Neck: no lymphadenopathy, thyromegaly or mass Heart: regular rate and rhythm without murmur Lungs: clear bilaterally, good air movement, no wheezes, rales, ronchi Abdomen: soft, nontender, no mass Extremities: no edema, normal pulses Psych: normal mood, affect, hygiene and grooming Neuro: alert and oriented, cranial nerves intact, normal gait.  Lab Results  Component Value Date   CHOL 150 03/08/2015   HDL 41* 03/08/2015   LDLCALC 74 03/08/2015   TRIG 175* 03/08/2015   CHOLHDL 3.7 03/08/2015   12/2014--normal CBC, ESR, CRP    Chemistry      Component Value Date/Time   NA 141 11/26/2014 0001   K 5.0 11/26/2014 0001   CL 105 11/26/2014 0001   CO2 29 11/26/2014 0001   BUN 13 11/26/2014 0001   CREATININE 0.60 11/26/2014 0001      Component Value Date/Time   CALCIUM 9.4 11/26/2014 0001   ALKPHOS 89 11/26/2014 0001   AST 19 11/26/2014 0001   ALT 19 11/26/2014 0001   BILITOT 0.6 11/26/2014 0001     ASSESSMENT/PLAN:  Asthma in remission - continue current regimen.  spirometry normal - Plan: Spirometry with Graph  Postmenopausal symptoms  Allergic rhinitis, unspecified allergic rhinitis type - controlled on current regimen; continue  Gastroesophageal reflux disease without esophagitis - controlled  Mixed hyperlipidemia - TG still mildly elevated per recent labs. Reviewed lowfat diet; continue statin and lovaza - Plan: Comprehensive metabolic panel  Chronic back pain - stable, per Dr. Hardin Negus (pain clinic)  Medication monitoring encounter - Plan: Comprehensive metabolic panel  c-met today (not fasting).  F/u 6 mos for CPE/AWV/med check, labs prior (CBC, c-met, lipids, TSH)

## 2015-05-16 ENCOUNTER — Encounter: Payer: Self-pay | Admitting: Family Medicine

## 2015-05-17 NOTE — Telephone Encounter (Signed)
Veronica--was it on [place] that we got a letter from the dentist?  I recall thinking it was fine for her to try, didn't recall if it required any feedback to the dentist.  I can't find the letter in the chart at all (not scanned in).  Can you please help? Thanks! (Apparently I need to contact Dr. Toy Cookey)

## 2015-05-22 MED FILL — OMEGA-3 ETHYL ESTERS 1 GM C: 1 | 90 days supply | Qty: 360 | Fill #1

## 2015-05-22 MED FILL — ATORVASTATIN 40 MG TABLET: 40 | 90 days supply | Qty: 90 | Fill #1

## 2015-05-27 ENCOUNTER — Telehealth: Payer: Self-pay | Admitting: *Deleted

## 2015-05-27 ENCOUNTER — Encounter: Payer: Self-pay | Admitting: Family Medicine

## 2015-05-27 NOTE — Telephone Encounter (Signed)
Patient called and states that she saw an opthomalogical neurologist, Jolyn Nap and he said that she has a platelet aggravation issue and suggested that she start taking an 81mg  baby aspirin daily. He told her to call her PCP and make sure there were no conflicts with any of her meds, as he didn't think there was. He is also referring her to a neuro at [facility]. She asked to please have response via MyChart. Thanks.

## 2015-05-27 NOTE — Telephone Encounter (Signed)
No contraindications. Message sent to patient.

## 2015-06-04 ENCOUNTER — Encounter: Payer: Self-pay | Admitting: Family Medicine

## 2015-06-08 ENCOUNTER — Encounter: Payer: Self-pay | Admitting: Family Medicine

## 2015-06-10 ENCOUNTER — Encounter: Payer: Self-pay | Admitting: *Deleted

## 2015-06-17 MED FILL — ESTRADIOL 0.5 MG TABLET: 0.5 | 90 days supply | Qty: 90 | Fill #2

## 2015-06-24 MED FILL — GABAPENTIN 800 MG TABLET: 800 | 90 days supply | Qty: 90 | Fill #1

## 2015-06-26 ENCOUNTER — Encounter: Payer: Self-pay | Admitting: Family Medicine

## 2015-07-09 MED FILL — PARoxetine HCL 30 MG TABS: 30 | 90 days supply | Qty: 90 | Fill #0

## 2015-07-10 MED FILL — HYDROCODON-APAP 7.5-325: 7.5-325 | 25 days supply | Qty: 100 | Fill #0

## 2015-07-22 ENCOUNTER — Other Ambulatory Visit: Payer: Self-pay | Admitting: Family Medicine

## 2015-07-22 MED FILL — ESOMEPRAZOLE MAG DR 40 MG C: 40 | 90 days supply | Qty: 90 | Fill #0

## 2015-08-07 ENCOUNTER — Telehealth: Payer: Self-pay | Admitting: Family Medicine

## 2015-08-07 NOTE — Telephone Encounter (Signed)
LMTCB

## 2015-08-07 NOTE — Telephone Encounter (Signed)
Pt called and was requesting a RX  to help her sleep while she is on a flight to [place]. Pt uses [facility] [place].

## 2015-08-07 NOTE — Telephone Encounter (Signed)
I believe we have previously prescribed ambien 5mg , #3 tablets for flights.  See if this is what she wants, and okay for #3, no refill. Be sure to caution her about the potential interaction with her pain meds, causing increased sedation--not sure if she was on the same pain meds the last time she took [place]. She might want to start at 1/2 tablet, and take the other 1/2 tablet if she still can't fall asleep after an hour.

## 2015-08-08 ENCOUNTER — Encounter: Payer: Self-pay | Admitting: Family Medicine

## 2015-08-08 ENCOUNTER — Telehealth: Payer: Self-pay

## 2015-08-08 ENCOUNTER — Telehealth: Payer: Self-pay | Admitting: Family Medicine

## 2015-08-08 ENCOUNTER — Other Ambulatory Visit: Payer: Self-pay | Admitting: Family Medicine

## 2015-08-08 MED ORDER — ZOLPIDEM TARTRATE 5 MG PO TABS: ORAL_TABLET | ORAL | Status: DC

## 2015-08-08 MED FILL — ZOLPIDEM TARTRATE 5 MG TAB: 5 | 3 days supply | Qty: 3 | Fill #0

## 2015-08-08 NOTE — Telephone Encounter (Signed)
Deny--we rx'd Beverly [name] yesterday for her trip. She shouldn't take both

## 2015-08-08 NOTE — Telephone Encounter (Signed)
Rcvd refill request for Temazepam 15 MG #15

## 2015-08-08 NOTE — Telephone Encounter (Signed)
Pt made aware how to take Ambien as directed on last telephone note dated 08/07/15. Note was closed before completion in Error. Rx called to pharmacy. Victorino December

## 2015-08-09 ENCOUNTER — Other Ambulatory Visit: Payer: Self-pay | Admitting: Family Medicine

## 2015-08-09 MED ORDER — CELECOXIB 200 MG PO CAPS: 200.0000 mg | ORAL_CAPSULE | Freq: Two times a day (BID) | ORAL | Status: DC

## 2015-08-09 MED FILL — CELECOXIB 200 MG CAPSULE: 200 | 90 days supply | Qty: 180 | Fill #0

## 2015-08-09 MED FILL — MONTELUKAST SOD 10 MG TAB: 10 | 90 days supply | Qty: 90 | Fill #0

## 2015-08-09 NOTE — Addendum Note (Signed)
Addended by: Rita Ohara on: 08/09/2015 08:48 AM   Modules accepted: Orders

## 2015-08-09 NOTE — Telephone Encounter (Signed)
LMTCB. /RLB 

## 2015-08-26 ENCOUNTER — Other Ambulatory Visit: Payer: Self-pay | Admitting: Family Medicine

## 2015-08-26 MED FILL — ATORVASTATIN 40 MG TABLET: 40 | 90 days supply | Qty: 90 | Fill #0

## 2015-09-01 ENCOUNTER — Ambulatory Visit (HOSPITAL_COMMUNITY)
Admission: EM | Admit: 2015-09-01 | Discharge: 2015-09-01 | Disposition: A | Discharge status: Home / Self Care | Payer: Medicare Other | Attending: Emergency Medicine | Admitting: Emergency Medicine

## 2015-09-01 ENCOUNTER — Encounter (HOSPITAL_COMMUNITY): Payer: Self-pay | Admitting: Emergency Medicine

## 2015-09-01 DIAGNOSIS — J40 Bronchitis, not specified as acute or chronic: Secondary | ICD-10-CM

## 2015-09-01 MED ORDER — PREDNISONE 50 MG PO TABS: ORAL_TABLET | ORAL | Status: DC

## 2015-09-01 MED ORDER — SODIUM CHLORIDE 0.9 % IN NEBU
INHALATION_SOLUTION | RESPIRATORY_TRACT | Status: AC
  Filled 2015-09-01: qty 3

## 2015-09-01 MED ORDER — IPRATROPIUM-ALBUTEROL 0.5-2.5 (3) MG/3ML IN SOLN
RESPIRATORY_TRACT | Status: AC
  Filled 2015-09-01: qty 3

## 2015-09-01 MED ORDER — DOXYCYCLINE HYCLATE 100 MG PO CAPS: 100.0000 mg | ORAL_CAPSULE | Freq: Two times a day (BID) | ORAL | Status: DC

## 2015-09-01 MED ORDER — IPRATROPIUM-ALBUTEROL 0.5-2.5 (3) MG/3ML IN SOLN
3.0000 mL | Freq: Once | RESPIRATORY_TRACT | Status: AC
  Administered 2015-09-01: 3 mL via RESPIRATORY_TRACT

## 2015-09-01 NOTE — ED Notes (Signed)
Patient is concerned for asthma and bronchitis.  Complains of sinus pressure and drainage.  Patient has used otc mucinex and asthma inhaler.

## 2015-09-01 NOTE — ED Provider Notes (Signed)
CSN: [id number]     Arrival date & time 09/01/15  1208 History   First MD Initiated Contact with Patient 09/01/15 1413     No chief complaint on file.  (Consider location/radiation/quality/duration/timing/severity/associated sxs/prior Treatment) HPI She is a 70 year old woman here for evaluation of cough. She states this started about 10 days ago after running through an airport to catch a flight. She reports persistent coughing that is productive of sputum. Initially was clear, but it has turned into a yellow gold color. She also reports wheezing and a lot of chest congestion. She also reports sinus drainage. No fevers. No shortness of breath or chest pain. She does have a history of asthma and has been using her inhalers as prescribed. She's been using her albuterol more frequently than she likes. Last use was this morning.  She states this happens periodically and she is typically treated with prednisone and doxycycline with good results.  Past Medical History  Diagnosis Date  . DJD (degenerative joint disease)   . Chronic pain   . Back pain     Dr Joline Salt 02/2010-epidural injection x 2 at L4-5 with good effect  . Asthma   . GERD (gastroesophageal reflux disease)   . Allergy   . HLD (hyperlipidemia)     hypertriglyceridemia  . Osteoarthritis     feet,shoulder,neck,back,hips and hands.  . IBS (irritable bowel syndrome)   . Internal hemorrhoids 1998  . Diverticulosis of colon 1998    mild  . Duodenal ulcer     h/o  . BCC (basal cell carcinoma of skin)     Dr. Denna Haggard  . History of sinus bradycardia   . Mitral regurgitation     mild  . Vitamin D deficiency     mild  . History of SCC (squamous cell carcinoma) of skin     Dr. Denna Haggard  . Cataract     left  . Depression   . Ocular migraine 10/31/14    Dr.Hecker   Past Surgical History  Procedure Laterality Date  . Cholecystectomy  1992  . Wisdom tooth extraction    . Tonsillectomy  age 71  . Neck surgery  1989    c6-7  cervical laminectomy and diskecotmy  . Shoulder surgery  2/05    left rotator cuff repair  . Total abdominal hysterectomy w/ bilateral salpingoophorectomy  1997    fibroids  . Hip surgery      right bursectomy x 3  . Inguinal hernia repair  9/09    bilat  . Carpal tunnel release  1989    bilateral  . Epidural steroid injection, back  02/2010  . Colonoscopy  2006  . Tubal ligation  1986  . Cesarean section      x2  . Bunionectomy      R 12/08, L 2004 (Dr. Janus Molder)  . Colonoscopy  01/2012    due again 01/2022; mild diverticulosis  . Cataract extraction Left 08/2012    Dr.Hecker   Family History  Problem Relation Age of Onset  . Depression Mother   . Schizophrenia Mother   . Cancer Father     oral  . Heart disease Father   . Cancer Sister     skin and lung  . COPD Sister   . Hypertension Sister   . Hyperthyroidism Brother   . Cancer Brother     metastatic cancer to bone (?primary)  . Hyperlipidemia Daughter   . Asthma Son   . Diabetes Maternal Grandmother   .  Cancer Paternal Grandmother 78    colon cancer  . Colon cancer Paternal Grandmother    Social History  Substance Use Topics  . Smoking status: Never Smoker   . Smokeless tobacco: Never Used  . Alcohol Use: 4.2 oz/week    7 Glasses of wine per week     Comment: 1 glass of wine daily.   OB History    Gravida Para Term Preterm AB TAB SAB Ectopic Multiple Living   2 2        2      Review of Systems As in history of present illness Allergies  Adhesive and Codeine  Home Medications   Prior to Admission medications   Medication Sig Start Date End Date Taking? Authorizing Provider  acetaminophen (TYLENOL) 500 MG tablet Take 1,000 mg by mouth as needed. Reported on 05/15/2015    Historical Provider, MD  albuterol (PROVENTIL HFA;VENTOLIN HFA) 108 (90 BASE) MCG/ACT inhaler Inhale 2 puffs into the lungs every 6 (six) hours as needed for wheezing. Patient not taking: Reported on 05/15/2015 11/26/14   Rita Ohara,  MD  aspirin EC 81 MG tablet Take 81 mg by mouth daily.    Historical Provider, MD  atorvastatin (LIPITOR) 40 MG tablet TAKE 1 TABLET BY MOUTH DAILY. 08/26/15   Rita Ohara, MD  beclomethasone (QVAR) 80 MCG/ACT inhaler Inhale 1 puff into the lungs 2 (two) times daily. 11/26/14 04/03/16  Rita Ohara, MD  Calcium Carbonate-Vitamin D (CALCIUM 600+D) 600-200 MG-UNIT TABS Take 1 tablet by mouth 2 (two) times daily.      Historical Provider, MD  celecoxib (CELEBREX) 200 MG capsule Take 1 capsule (200 mg total) by mouth 2 (two) times daily. 08/09/15   Rita Ohara, MD  cetirizine (ZYRTEC) 10 MG tablet Take 10 mg by mouth daily.      Historical Provider, MD  doxycycline (VIBRAMYCIN) 100 MG capsule Take 1 capsule (100 mg total) by mouth 2 (two) times daily. 09/01/15   Melony Overly, MD  esomeprazole (NEXIUM) 40 MG capsule TAKE 1 CAPSULE BY MOUTH DAILY BEFORE SUPPER. 07/22/15   Rita Ohara, MD  estradiol (ESTRACE) 0.5 MG tablet Take 1 tablet (0.5 mg total) by mouth daily. 11/26/14   Rita Ohara, MD  fentaNYL (DURAGESIC - DOSED MCG/HR) 25 MCG/HR Place 1 patch onto the skin every 3 (three) days.      Historical Provider, MD  fluorouracil (EFUDEX) 5 % cream Apply topically daily.    Historical Provider, MD  fluticasone (FLONASE) 50 MCG/ACT nasal spray PLACE 2 SPRAYS INTO THE NOSE DAILY. 11/26/14   Rita Ohara, MD  gabapentin (NEURONTIN) 800 MG tablet Take 800 mg by mouth at bedtime.     Historical Provider, MD  methocarbamol (ROBAXIN) 500 MG tablet Take 500 mg by mouth every 8 (eight) hours as needed for muscle spasms.    Historical Provider, MD  montelukast (SINGULAIR) 10 MG tablet TAKE 1 TABLET BY MOUTH AT BEDTIME. 08/09/15   Rita Ohara, MD  Multiple Vitamins-Minerals (MULTIVITAMIN WITH MINERALS) tablet Take 1 tablet by mouth daily.    Historical Provider, MD  omega-3 acid ethyl esters (LOVAZA) 1 g capsule Take 2 capsules (2 g total) by mouth 2 (two) times daily. 02/15/15   Rita Ohara, MD  PARoxetine (PAXIL) 30 MG tablet  07/17/14    Historical Provider, MD  predniSONE (DELTASONE) 50 MG tablet Take 1 pill daily for 5 days. 09/01/15   Melony Overly, MD  zolpidem (AMBIEN) 5 MG tablet Take 1/2 tablet (2.5mg ) as needed  at bedtime, may repeat 1/2 tablet (2.5mg ) 1 hour after first dose if needed. 08/08/15   Rita Ohara, MD   Meds Ordered and Administered this Visit   Medications  ipratropium-albuterol (DUONEB) 0.5-2.5 (3) MG/3ML nebulizer solution 3 mL (3 mLs Nebulization Given 09/01/15 1450)    BP 111/56 mmHg  Pulse 60  Temp(Src) 98.4 F (36.9 C) (Oral)  Resp 18  SpO2 99% No data found.   Physical Exam  Constitutional: She is oriented to person, place, and time. She appears well-developed and well-nourished. No distress.  Neck: Neck supple.  Cardiovascular: Normal rate, regular rhythm and normal heart sounds.   No murmur heard. Pulmonary/Chest: Effort normal. No respiratory distress. She has wheezes. She has no rales.  Scattered wheezes. Diffuse rhonchi that partially clear with cough.  Neurological: She is alert and oriented to person, place, and time.    ED Course  Procedures (including critical care time)  Labs Review Labs Reviewed - No data to display  Imaging Review No results found.   MDM   1. Bronchitis    Lung fields are clear after DuoNeb. She also reports subjective improvement.  We'll treat with doxycycline and prednisone. Regular albuterol use for the next several days. Follow-up as needed.    Melony Overly, MD 09/01/15 [phone]

## 2015-09-01 NOTE — Discharge Instructions (Signed)
You have bronchitis. Take doxycycline and prednisone as prescribed. Use your albuterol every 4 hours as needed for wheezing or cough. You should see improvement in the next 2-3 days. If you develop fevers, difficulty breathing, or are just not getting better, please come back or go to the emergency room.

## 2015-09-09 ENCOUNTER — Other Ambulatory Visit: Payer: Self-pay | Admitting: Family Medicine

## 2015-09-16 MED FILL — ESTRADIOL 0.5 MG TABLET: 0.5 | 90 days supply | Qty: 90 | Fill #3

## 2015-09-19 MED FILL — OMEGA-3 ETHYL ESTERS 1 GM C: 1 | 90 days supply | Qty: 360 | Fill #0

## 2015-09-20 MED FILL — GABAPENTIN 800 MG TABLET: 800 | 90 days supply | Qty: 90 | Fill #0

## 2015-10-02 MED FILL — METHOCARBAMOL 500 MG TABLET: 500 | 60 days supply | Qty: 180 | Fill #1

## 2015-10-02 MED FILL — PARoxetine HCL 30 MG TABS: 30 | 90 days supply | Qty: 90 | Fill #1

## 2015-10-09 MED FILL — fentaNYL 25 MCG/HR PT72: 25 | 90 days supply | Qty: 30 | Fill #0

## 2015-10-18 MED FILL — ESOMEPRAZOLE MAG DR 40 MG C: 40 | 90 days supply | Qty: 90 | Fill #1

## 2015-10-27 ENCOUNTER — Encounter: Payer: Self-pay | Admitting: Family Medicine

## 2015-10-28 MED FILL — FLUTICASONE PROP 50 MCG SPR: 50 | 90 days supply | Qty: 48 | Fill #1

## 2015-11-06 ENCOUNTER — Other Ambulatory Visit: Payer: Self-pay | Admitting: Family Medicine

## 2015-11-06 NOTE — Telephone Encounter (Signed)
Left message asking pt if she would like me to refill #30 now or if she would like to do the labs tomorrow and I can refill for #90.

## 2015-11-06 NOTE — Telephone Encounter (Signed)
Is this okay to refill? 

## 2015-11-06 NOTE — Telephone Encounter (Signed)
She was supposed to have her labs done now (NOT before her November appointment)--needed every 6 months due to her Celebrex use.  She hasn't done them yet.  I don't want to refill #180 without seeing labs.  She can get them done and then they can be refilled, or a 30d supply can be sent in now, and do the 90d after labs done

## 2015-11-08 ENCOUNTER — Telehealth: Payer: Self-pay | Admitting: Family Medicine

## 2015-11-08 MED ORDER — FLUTICASONE PROPIONATE HFA 110 MCG/ACT IN AERO: 1.0000 | INHALATION_SPRAY | Freq: Two times a day (BID) | RESPIRATORY_TRACT | 0 refills | Status: DC

## 2015-11-08 MED FILL — CELECOXIB 200 MG CAPSULE: 200 | 30 days supply | Qty: 60 | Fill #0

## 2015-11-08 MED FILL — FLOVENT HFA 110 MCG INHALER: 110 | 60 days supply | Qty: 12 | Fill #0

## 2015-11-08 NOTE — Telephone Encounter (Signed)
rx changed, sent to pharmacy

## 2015-11-08 NOTE — Telephone Encounter (Signed)
If labs were done today, can refill #90. If labs not done, only #30.  (she said to go ahead and fill 30, but getting labs done--I'm happy to do 90 if she actually got her labs drawn today)

## 2015-11-08 NOTE — Telephone Encounter (Signed)
Pt states she is going today to have labs drawn and would like for you to go ahead and send in the 30 days today

## 2015-11-08 NOTE — Telephone Encounter (Signed)
Rcvd note from pharmacy states that pt's insurance prefers Flovent over Qvar & pt is ok with switching. Pharmacy is requesting a new script for Flovent.

## 2015-11-18 ENCOUNTER — Other Ambulatory Visit: Payer: Self-pay | Admitting: Family Medicine

## 2015-11-18 MED FILL — MONTELUKAST SOD 10 MG TAB: 10 | 90 days supply | Qty: 90 | Fill #1 | Status: TO

## 2015-11-19 ENCOUNTER — Encounter: Payer: Self-pay | Admitting: Family Medicine

## 2015-11-20 ENCOUNTER — Encounter: Payer: Self-pay | Admitting: Family Medicine

## 2015-11-21 ENCOUNTER — Encounter: Payer: Self-pay | Admitting: Family Medicine

## 2015-11-27 ENCOUNTER — Other Ambulatory Visit: Payer: Self-pay | Admitting: Family Medicine

## 2015-11-27 ENCOUNTER — Telehealth: Payer: Self-pay | Admitting: *Deleted

## 2015-11-27 MED ORDER — CELECOXIB 200 MG PO CAPS: 200.0000 mg | ORAL_CAPSULE | Freq: Two times a day (BID) | ORAL | 1 refills | Status: DC

## 2015-11-27 MED ORDER — ATORVASTATIN CALCIUM 40 MG PO TABS: 40.0000 mg | ORAL_TABLET | Freq: Every day | ORAL | 1 refills | Status: DC

## 2015-11-27 MED FILL — ATORVASTATIN 40 MG TABLET: 40 | 90 days supply | Qty: 90 | Fill #0 | Status: TO

## 2015-11-27 NOTE — Telephone Encounter (Signed)
Labs reviewed, to be scanned.  11/22/15: normal c-met; TC 140, TG 184; HDL 36, LDL 67, ratio 3.9  meds refilled x 6 mos. Has appt 12/2015

## 2015-11-27 NOTE — Telephone Encounter (Signed)
Patient needs a refill on her lipitor 40mg  and celebrex  90 day supply to [facility]-I put her labs on your shelf. Let me know if this is okay, thanks.

## 2015-12-04 ENCOUNTER — Encounter: Payer: Self-pay | Admitting: Family Medicine

## 2015-12-05 ENCOUNTER — Encounter: Payer: Self-pay | Admitting: Family Medicine

## 2015-12-06 LAB — HM MAMMOGRAPHY

## 2015-12-08 ENCOUNTER — Encounter: Payer: Self-pay | Admitting: Family Medicine

## 2015-12-15 ENCOUNTER — Encounter: Payer: Self-pay | Admitting: Family Medicine

## 2015-12-15 DIAGNOSIS — N959 Unspecified menopausal and perimenopausal disorder: Secondary | ICD-10-CM

## 2015-12-16 MED ORDER — ESTRADIOL 0.5 MG PO TABS: 0.5000 mg | ORAL_TABLET | Freq: Every day | ORAL | 0 refills | Status: DC

## 2015-12-16 MED FILL — ESTRADIOL 0.5 MG TABLET: 0.5 | 90 days supply | Qty: 90 | Fill #0

## 2015-12-16 MED FILL — OMEGA-3 ETHYL ESTERS 1 GM C: 1 | 90 days supply | Qty: 360 | Fill #1

## 2015-12-16 MED FILL — GABAPENTIN 800 MG TABLET: 800 | 90 days supply | Qty: 90 | Fill #1 | Status: TO

## 2015-12-18 ENCOUNTER — Encounter: Payer: Self-pay | Admitting: *Deleted

## 2015-12-22 ENCOUNTER — Telehealth: Payer: Self-pay | Admitting: Family Medicine

## 2015-12-22 ENCOUNTER — Encounter: Payer: Self-pay | Admitting: Family Medicine

## 2015-12-23 MED ORDER — FLUTICASONE PROPIONATE HFA 110 MCG/ACT IN AERO: 1.0000 | INHALATION_SPRAY | Freq: Two times a day (BID) | RESPIRATORY_TRACT | 0 refills | Status: DC

## 2015-12-23 MED FILL — FLOVENT HFA 110 MCG INHALER: 110 | 60 days supply | Qty: 12 | Fill #0

## 2015-12-24 MED ORDER — FLUTICASONE PROPIONATE HFA 110 MCG/ACT IN AERO: 1.0000 | INHALATION_SPRAY | Freq: Two times a day (BID) | RESPIRATORY_TRACT | 0 refills | Status: DC

## 2015-12-24 NOTE — Telephone Encounter (Signed)
I authorized the refill (which was requested for 90d per refill request).  Sounds like it was done in error for just one.  Okay for 90d

## 2015-12-24 NOTE — Telephone Encounter (Signed)
Is this okay to fill this for 90 days

## 2016-01-01 MED FILL — CELECOXIB 200 MG CAPSULE: 200 | 90 days supply | Qty: 180 | Fill #0 | Status: TO

## 2016-01-02 ENCOUNTER — Ambulatory Visit: Payer: Medicare Other | Admitting: Family Medicine

## 2016-01-11 ENCOUNTER — Encounter: Payer: Self-pay | Admitting: Family Medicine

## 2016-01-12 ENCOUNTER — Other Ambulatory Visit: Payer: Self-pay | Admitting: Family Medicine

## 2016-01-12 MED FILL — METHOCARBAMOL 500 MG TABLET: 500 | 60 days supply | Qty: 180 | Fill #2

## 2016-01-12 MED FILL — PARoxetine HCL 30 MG TABS: 30 | 90 days supply | Qty: 90 | Fill #2

## 2016-01-13 ENCOUNTER — Other Ambulatory Visit: Payer: Self-pay | Admitting: *Deleted

## 2016-01-13 MED FILL — ESOMEPRAZOLE MAG DR 40 MG C: 40 | 90 days supply | Qty: 90 | Fill #0 | Status: TO

## 2016-02-07 ENCOUNTER — Telehealth: Payer: Self-pay

## 2016-02-07 NOTE — Telephone Encounter (Signed)
See mychart message. LM for pt to CB. Victorino December

## 2016-02-12 NOTE — Telephone Encounter (Deleted)
Dr. Tomi Bamberger- not sure if we can document her

## 2016-02-12 NOTE — Telephone Encounter (Signed)
Updated message sent to pt.

## 2016-02-12 NOTE — Telephone Encounter (Signed)
That sounds fine to send.  You could mention that one of the options (and look at letter and verify this to be true--I don't want to rely on a faulty memory!) was the ring (estring)--she might like that better than a cream (less messy maybe?).  In general, it isn't recommended for [place] to keep patients on oral estrogen therapy endlessly (like it once was). The goal is to use the lowest effective dose, and usually try and taper (after 5-10 years, and she likely has been on this much longer) if no longer needed to treat menopausal symptoms (hot flashes, night sweats).  The creams are very effective in treating vaginal dryness.

## 2016-02-18 NOTE — Telephone Encounter (Signed)
[facility] called [facility] to get PA info, it is [phone] ID # [id number]. Victorino December

## 2016-02-18 NOTE — Telephone Encounter (Signed)
Sure

## 2016-02-18 NOTE — Telephone Encounter (Signed)
Please see pt reply to me.  Do you want to try PA on mg tablet?

## 2016-02-21 MED FILL — MONTELUKAST SOD 10 MG TAB: 10 | 90 days supply | Qty: 90 | Fill #0

## 2016-02-21 MED FILL — ATORVASTATIN 40 MG TABLET: 40 | 90 days supply | Qty: 90 | Fill #0

## 2016-03-02 ENCOUNTER — Encounter: Payer: Self-pay | Admitting: Family Medicine

## 2016-03-03 ENCOUNTER — Telehealth: Payer: Self-pay | Admitting: Family Medicine

## 2016-03-03 NOTE — Telephone Encounter (Signed)
P.A. ESTRADIOL  °

## 2016-03-06 ENCOUNTER — Encounter: Payer: Self-pay | Admitting: Family Medicine

## 2016-03-11 ENCOUNTER — Telehealth: Payer: Self-pay | Admitting: Family Medicine

## 2016-03-11 ENCOUNTER — Encounter: Payer: Self-pay | Admitting: Family Medicine

## 2016-03-11 NOTE — Telephone Encounter (Signed)
Letter of appeal typed & faxed to t# [phone].  Pt informed

## 2016-03-11 NOTE — Telephone Encounter (Signed)
P.A. Denied, states because information was not received that provider acknowledged that drug is high risk medication in age 69 and older.  I called [facility] and advised this was faxed back.  She stated was already denied and only option was appeal.

## 2016-03-11 NOTE — Telephone Encounter (Signed)
I feel like we have done this--can you please look into this?

## 2016-03-11 NOTE — Telephone Encounter (Signed)
Patient will check with insurance.

## 2016-03-11 NOTE — Progress Notes (Signed)
Chief Complaint  Patient presents with  . Medicare Wellness    fasting AWV/CPE with pelvic exam. Sees Dr Kathlen Mody and had appt next appt next week.     Beverly [name] is a 71 y.o. female who presents for annual wellness visit and follow-up on chronic medical conditions.  She has the following concerns:  Postmenopausal symptoms--significantly improved since starting Estrace. Last mammogram was 11/2015.  Estrace was recently denied, awaiting appeal.  GERD: Doing well on Nexium. She has recurrent heartburn if she misses a pill, or if she eats too late at night (rarely happens). Denies dysphagia.  Depression: Doing well on Paxil 60m, without side effects.  This is prescribed by Dr. PHardin Negus   Asthma: Only needs to use rescue inhaler when she has an illness/URI (required a prednisone course and albuterol with illness a year ago, none since). Uses Flovent and Singulair regularly without side effects.  Allergies: She stays on zyrtec, flonase and singulair year-round. Currently are allergies are well controlled.  Hyperlipidemia follow-up: Patient is reportedly following a low-fat, low cholesterol diet. Compliant with medications and denies medication side effects.On atorvastatin and denies any side effects. She is taking Lovaza--tolerating it much better than fish oil (which caused gas).  Labs done 11/2015 (see scanned report): TC 140, TG 184, LDL 67, HDL 36 Normal c-met  Had appt with neuro-ophtho through [facility] (Dr. TJolyn Nap in April. She reports his exam was normal, but [facility].  She sees her in f/u next week.    OSA: wear appliance from Dr. FToy Cookey   Immunization History  Administered Date(s) Administered  . Hepatitis A 06/17/2006, 01/05/2007  . IPV 04/15/2012  . Influenza Split 10/31/2013  . Influenza-Unspecified 11/09/2014, 11/14/2015  . Pneumococcal Conjugate-13 07/20/2013  . Pneumococcal Polysaccharide-23  06/17/2006, 11/26/2014  . Tdap 09/16/2004, 10/08/2014  . Typhoid Live 06/17/2011  . Yellow Fever 07/08/2012  . Zoster 04/04/2009   She gets flu shots annually at work  Last Pap smear: 1997 (s/p hysterectomy)  Last mammogram: 11/2015 Last colonoscopy: 01/2012, Dr. KRich Fuchsdiverticulosis; repeat in 10 years Last DEXA: 11/2013 T-1.2 (signific decline over 11 years, [facility] rec recheck 2 yrs, I feel can be longer) Dentist: twice yearly, also seeing endodontist (needed tooth extraction, plans for implant)  Ophtho: yearly Exercise: Just started yoga 2x/week; Walking dog   Other doctors caring for patient include: Doctors caring for pt include Dr. MNicholaus Bloom(pain) Dr. FToy Cookey(dentist) Dr. KSue Lush(endodontist) Dr. TSatira Sark(periodontist) Dr. TDenna Haggard(derm) [facility] Dr. SJanne Labat [facility]--neuro-ophtho Dr. BMaxie Better(ortho) Dr. KDeatra Ina(GI)  Depression, Fall and Functional Status screens were performed--see Epic. 1 Fall--no injury (tripped on dog's leash).  End of Life Discussion:  Patient has a living will and medical power of attorney  Past Medical History:  Diagnosis Date  . Allergy   . Amaurosis fugax    negative w/u through [facility]  . Asthma   . Back pain    Dr PJoline Salt1/2012-epidural injection x 2 at L4-5 with good effect  . BCC (basal cell carcinoma of skin)    Dr. TDenna Haggard . Cataract    left  . Chronic pain   . Depression   . Diverticulosis of colon 1998   mild  . DJD (degenerative joint disease)   . Duodenal ulcer    h/o  . GERD (gastroesophageal reflux disease)   . History of SCC (squamous cell carcinoma) of skin  Dr. Denna Haggard  . History of sinus bradycardia   . HLD (hyperlipidemia)    hypertriglyceridemia  . IBS (irritable bowel syndrome)   . Internal hemorrhoids 1998  . Mitral regurgitation    mild  . Ocular migraine 10/31/14   Dr.Hecker  . Osteoarthritis     feet,shoulder,neck,back,hips and hands.  . Vitamin D deficiency    mild    Past Surgical History:  Procedure Laterality Date  . BUNIONECTOMY     R 12/08, L 2004 (Dr. Janus Molder)  . CARPAL TUNNEL RELEASE  1989   bilateral  . CATARACT EXTRACTION Left 08/2012   Dr.Hecker  . CESAREAN SECTION     x2  . CHOLECYSTECTOMY  1992  . COLONOSCOPY  2006  . COLONOSCOPY  01/2012   due again 01/2022; mild diverticulosis  . epidural steroid injection, back  02/2010  . HIP SURGERY     right bursectomy x 3  . HIP SURGERY Right 2000   torn cartilage, repaired  . INGUINAL HERNIA REPAIR  9/09   bilat  . NECK SURGERY  1989   c6-7 cervical laminectomy and diskecotmy  . SHOULDER SURGERY  2/05   left rotator cuff repair  . TONSILLECTOMY  age 53  . TOTAL ABDOMINAL HYSTERECTOMY W/ BILATERAL SALPINGOOPHORECTOMY  1997   fibroids  . TROCHANTERIC BURSA EXCISION Right 1978  . TUBAL LIGATION  1986  . WISDOM TOOTH EXTRACTION      Social History   Social History  . Marital status: Divorced    Spouse name: N/A  . Number of children: 2  . Years of education: N/A   Occupational History  . [employer]   Social History Main Topics  . Smoking status: Never Smoker  . Smokeless tobacco: Never Used  . Alcohol use 4.2 oz/week    7 Glasses of wine per week     Comment: 1 glass of wine daily.  . Drug use: No  . Sexual activity: Not Currently   Other Topics Concern  . Not on file   Social History Narrative   Divorced, lives alone, [place]; exercises 1 day per week (mowing grass).  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  Adopted a miniature poodle 08/2015 (11yo).   Son lives in [place] (2 grandchildren), daughter lives in [place], [place]    Family History  Problem Relation Age of Onset  . Depression Mother   . Schizophrenia Mother   . Cancer Father     oral  . Heart disease Father   . Cancer Sister     skin and lung  . COPD Sister   . Hypertension Sister   .  Hyperthyroidism Brother   . Cancer Brother     metastatic cancer to bone (?primary)  . Hyperlipidemia Daughter   . Asthma Son   . Cancer Paternal Grandmother 67    colon cancer, metastatic to liver  . Colon cancer Paternal Grandmother   . Diabetes Maternal Grandmother     Outpatient Encounter Prescriptions as of 03/12/2016  Medication Sig Note  . aspirin EC 81 MG tablet Take 81 mg by mouth daily.   Marland Kitchen atorvastatin (LIPITOR) 40 MG tablet Take 1 tablet (40 mg total) by mouth daily.   . Calcium Carbonate-Vitamin D (CALCIUM 600+D) 600-200 MG-UNIT TABS Take 1 tablet by mouth 2 (two) times daily.     . celecoxib (CELEBREX) 200 MG capsule Take 1 capsule (200 mg total) by mouth 2 (two) times daily.   . cetirizine (ZYRTEC) 10 MG tablet  Take 10 mg by mouth daily.     Marland Kitchen esomeprazole (NEXIUM) 40 MG capsule TAKE 1 CAPSULE BY MOUTH DAILY BEFORE SUPPER.   Marland Kitchen estradiol (ESTRACE) 0.5 MG tablet Take 1 tablet (0.5 mg total) by mouth daily.   . fentaNYL (DURAGESIC - DOSED MCG/HR) 25 MCG/HR Place 1 patch onto the skin every 3 (three) days.     . fluticasone (FLONASE) 50 MCG/ACT nasal spray PLACE 2 SPRAYS INTO THE NOSE DAILY.   . fluticasone (FLOVENT HFA) 110 MCG/ACT inhaler Inhale 1 puff into the lungs 2 (two) times daily. Rinse mouth after use   . gabapentin (NEURONTIN) 800 MG tablet Take 800 mg by mouth at bedtime.    . methocarbamol (ROBAXIN) 500 MG tablet Take 500 mg by mouth every 8 (eight) hours as needed for muscle spasms. 05/15/2015: Takes once daily, in the morning  . montelukast (SINGULAIR) 10 MG tablet TAKE 1 TABLET BY MOUTH AT BEDTIME.   . Multiple Vitamins-Minerals (MULTIVITAMIN WITH MINERALS) tablet Take 1 tablet by mouth daily.   Marland Kitchen omega-3 acid ethyl esters (LOVAZA) 1 g capsule TAKE 2 CAPSULES BY MOUTH 2 TIMES DAILY.   Marland Kitchen PARoxetine (PAXIL) 30 MG tablet Take 30 mg by mouth daily.  10/02/2014: Received from: External Pharmacy  . acetaminophen (TYLENOL) 500 MG tablet Take 1,000 mg by mouth as needed.  Reported on 05/15/2015   . albuterol (PROVENTIL HFA;VENTOLIN HFA) 108 (90 BASE) MCG/ACT inhaler Inhale 2 puffs into the lungs every 6 (six) hours as needed for wheezing. (Patient not taking: Reported on 05/15/2015)   . fluorouracil (EFUDEX) 5 % cream Apply topically daily.   . [DISCONTINUED] doxycycline (VIBRAMYCIN) 100 MG capsule Take 1 capsule (100 mg total) by mouth 2 (two) times daily.   . [DISCONTINUED] predniSONE (DELTASONE) 50 MG tablet Take 1 pill daily for 5 days.   . [DISCONTINUED] zolpidem (AMBIEN) 5 MG tablet Take 1/2 tablet (2.106m) as needed at bedtime, may repeat 1/2 tablet (2.593m 1 hour after first dose if needed.    No facility-administered encounter medications on file as of 03/12/2016.     Allergies  Allergen Reactions  . Adhesive [Tape] Rash  . Codeine Rash    ROS: The patient denies anorexia, fever, weight changes, headaches, vision changes (none recently), decreased hearing, ear pain, sore throat, breast concerns, chest pain, palpitations, dizziness, syncope, dyspnea on exertion, swelling, nausea, vomiting, diarrhea, constipation, abdominal pain, melena, hematochezia, indigestion/heartburn, hematuria, dysuria, vaginal bleeding, discharge, odor or itch, genital lesions, numbness, tingling, weakness, tremor, suspicious skin lesions, depression, anxiety, abnormal bleeding/bruising, or enlarged lymph nodes.  +chronic back pain. Pain is well controlled. She has morning hand stiffness, and hip stiffness after sitting. Some trouble sleeping at night related to some pain in back with position changes. Gets back to sleep easily. Recent skin biopsies from derm/plastics. Very slight stress urinary incontinence with cough (when bladder is full). Denies hot flashes/sweats since on HRT No further vision loss (last was 1.5 years ago). Ophthalmic migraines have decreased in frequency significantly. Slight nasal congestion from her allergies today. Mild constipation (controlled with  apples, drinking more water).   PHYSICAL EXAM:  BP 110/70 (BP Location: Left Arm, Patient Position: Sitting, Cuff Size: Normal)   Pulse 60   Ht 5' 5"  (1.651 m)   Wt 160 lb 9.6 oz (72.8 kg)   BMI 26.73 kg/m   General Appearance:  Alert, cooperative, no distress, appears stated age   Head:  Normocephalic, without obvious abnormality, atraumatic. Bandage at L forehead (recent skin biopsy)  Eyes:  PERRL, conjunctiva/corneas clear, EOM's intact, fundi  Benign. Left eye is brown, right is blue.  Ears:  Normal TM's and external ear canals   Nose:  Nares normal, mucosa is mildly edematous, no erythema, drainage or sinus tenderness   Throat:  Lips, mucosa, and tongue normal; teeth and gums normal   Neck:  Supple, no lymphadenopathy; thyroid: no enlargement/tenderness/nodules; no carotid bruit or JVD   Back:  Spine nontender, no curvature, ROM normal, no CVA tenderness   Lungs:  Clear to auscultation bilaterally without wheezes, rales or ronchi; respirations unlabored   Chest Wall:  No tenderness or deformity   Heart:  Regular rate and rhythm, S1 and S2 normal, no murmur, rub or gallop   Breast Exam:  No tenderness, masses, or nipple discharge or inversion. No axillary lymphadenopathy   Abdomen:  Soft, non-tender, nondistended, normoactive bowel sounds,  no masses, no hepatosplenomegaly   Genitalia:  Normal external genitalia without lesions. No vaginal discharge. Uterus surgically absent. No palpable adnexa or masses.   Rectal:  Normal tone, no masses or tenderness; guaiac negative stool  Extremities:  No clubbing, cyanosis or edema   Pulses:  2+ and symmetric all extremities   Skin:  Skin color, texture, turgor normal, no rashes.   Lymph nodes:  Cervical, supraclavicular, and axillary nodes normal   Neurologic:  CNII-XII intact, normal strength, sensation and gait; reflexes 2+ and symmetric throughout    Psych:   Normal mood,  affect, hygiene and grooming   ASSESSMENT/PLAN:  Annual physical exam - Plan: POCT Urinalysis Dipstick  Medicare annual wellness visit, subsequent  Asthma in remission - continue current regimen; spirometry to be done at med check in 3 mos  Gastroesophageal reflux disease without esophagitis  Mixed hyperlipidemia  Chronic back pain, unspecified back location, unspecified back pain laterality  Medication monitoring encounter  Postmenopausal symptoms - counseled re: risks of HRT; encouraged tapering to lowest effective dose; reviewed OTC measures. waiting on appeal for coverage   Last labs 11/2015. Return in 3 mos for fasting med check--due then for c-met, lipids, TSH, CBC and spirometry at visit.   Full code, full care.    Discussed monthly self breast exams and yearly mammograms; at least 30 minutes of aerobic activity at least 5 days/week, weight-bearing exercise at least 2x/wk; proper sunscreen use reviewed; healthy diet, including goals of calcium and vitamin D intake and alcohol recommendations (less than or equal to 1 drink/day) reviewed; regular seatbelt use; changing batteries in smoke detectors. Immunization recommendations discussed--Shingrix recommended when available; high dose flu shots yearly. Colonoscopy recommendations reviewed, UTD (due again 2023).  DEXA--only slight decline over 11 year time frame, and currently on HRT. Repeat not currently warranted.    Medicare Attestation I have personally reviewed: The patient's medical and social history Their use of alcohol, tobacco or illicit drugs Their current medications and supplements The patient's functional ability including ADLs,fall risks, home safety risks, cognitive, and hearing and visual impairment Diet and physical activities Evidence for depression or mood disorders  The patient's weight, height, and BMI have been recorded in the chart.  I have made referrals, counseling, and provided education  to the patient based on review of the above and I have provided the patient with a written personalized care plan for preventive services.     Kijuan Gallicchio A, MD   03/11/2016

## 2016-03-11 NOTE — Telephone Encounter (Signed)
  Please call patient  Patient states that her dentist , Cindie Laroche, contacted you sometime last year about submitting documentation to her insurance to help get approval for a mouth device for obstructive sleep apnea and she is wanting to see if that information has been submitted to her insurance

## 2016-03-12 ENCOUNTER — Ambulatory Visit (INDEPENDENT_AMBULATORY_CARE_PROVIDER_SITE_OTHER): Payer: Medicare Other | Admitting: Family Medicine

## 2016-03-12 ENCOUNTER — Encounter: Payer: Self-pay | Admitting: Family Medicine

## 2016-03-12 VITALS — BP 110/70 | HR 60 | Ht 65.0 in | Wt 160.6 lb

## 2016-03-12 DIAGNOSIS — M549 Dorsalgia, unspecified: Secondary | ICD-10-CM | POA: Diagnosis not present

## 2016-03-12 DIAGNOSIS — J45998 Other asthma: Secondary | ICD-10-CM | POA: Diagnosis not present

## 2016-03-12 DIAGNOSIS — Z5181 Encounter for therapeutic drug level monitoring: Secondary | ICD-10-CM | POA: Diagnosis not present

## 2016-03-12 DIAGNOSIS — K219 Gastro-esophageal reflux disease without esophagitis: Secondary | ICD-10-CM

## 2016-03-12 DIAGNOSIS — N959 Unspecified menopausal and perimenopausal disorder: Secondary | ICD-10-CM | POA: Diagnosis not present

## 2016-03-12 DIAGNOSIS — Z0000 Encounter for general adult medical examination without abnormal findings: Secondary | ICD-10-CM | POA: Diagnosis not present

## 2016-03-12 DIAGNOSIS — G8929 Other chronic pain: Secondary | ICD-10-CM | POA: Diagnosis not present

## 2016-03-12 DIAGNOSIS — E782 Mixed hyperlipidemia: Secondary | ICD-10-CM | POA: Diagnosis not present

## 2016-03-12 LAB — POCT URINALYSIS DIPSTICK
Glucose, UA: NEGATIVE
LEUKOCYTES UA: NEGATIVE
NITRITE UA: NEGATIVE
PH UA: 5
RBC UA: NEGATIVE
Spec Grav, UA: <=1.005
Urobilinogen, UA: 0.2

## 2016-03-12 MED ORDER — FLUTICASONE PROPIONATE 50 MCG/ACT NA SUSP: NASAL | 3 refills | Status: DC

## 2016-03-12 MED FILL — FLUTICASONE PROP 50 MCG SPR: 50 | 90 days supply | Qty: 48 | Fill #0

## 2016-03-12 NOTE — Patient Instructions (Addendum)
  HEALTH MAINTENANCE RECOMMENDATIONS:  It is recommended that you get at least 30 minutes of aerobic exercise at least 5 days/week (for weight loss, you may need as much as 60-90 minutes). This can be any activity that gets your heart rate up. This can be divided in 10-15 minute intervals if needed, but try and build up your endurance at least once a week.  Weight bearing exercise is also recommended twice weekly.  Eat a healthy diet with lots of vegetables, fruits and fiber.  "Colorful" foods have a lot of vitamins (ie green vegetables, tomatoes, red peppers, etc).  Limit sweet tea, regular sodas and alcoholic beverages, all of which has a lot of calories and sugar.  Up to 1 alcoholic drink daily may be beneficial for women (unless trying to lose weight, watch sugars).  Drink a lot of water.  Calcium recommendations are 1200-1500 mg daily (1500 mg for postmenopausal women or women without ovaries), and vitamin D 1000 IU daily.  This should be obtained from diet and/or supplements (vitamins), and calcium should not be taken all at once, but in divided doses.  Monthly self breast exams and yearly mammograms for women over the age of 61 is recommended.  Sunscreen of at least SPF 30 should be used on all sun-exposed parts of the skin when outside between the hours of 10 am and 4 pm (not just when at beach or pool, but even with exercise, golf, tennis, and yard work!)  Use a sunscreen that says "broad spectrum" so it covers both UVA and UVB rays, and make sure to reapply every 1-2 hours.  Remember to change the batteries in your smoke detectors when changing your clock times in the spring and fall.  Use your seat belt every time you are in a car, and please drive safely and not be distracted with cell phones and texting while driving.   Beverly [name] , Thank you for taking time to come for your Medicare Wellness Visit. I appreciate your ongoing commitment to your health goals. Please review the  following plan we discussed and let me know if I can assist you in the future.   These are the goals we discussed: Goals    None      This is a list of the screening recommended for you and due dates:  Health Maintenance  Topic Date Due  . Mammogram  12/05/2017  . Colon Cancer Screening  01/21/2022  . Tetanus Vaccine  10/07/2024  . Flu Shot  Completed  . DEXA scan (bone density measurement)  Completed  . Shingles Vaccine  Completed  .  Hepatitis C: One time screening is recommended by Center for Disease Control  (CDC) for  adults born from [birth year] through 1963.   Completed  . Pneumonia vaccines  Completed   Next mammogram is due 11/2016 (not 2019 as stated above--I recommend them yearly, especially when on hormones).  I recommend getting the new shingles vaccine (Shingrix) when available. You will need to check with your insurance to see if it is covered, and if covered by Medicare Part D, you need to get from the pharmacy rather than our office.  It is a series of 2 injections, spaced 2 months apart.

## 2016-03-16 ENCOUNTER — Ambulatory Visit (INDEPENDENT_AMBULATORY_CARE_PROVIDER_SITE_OTHER): Payer: Medicare Other | Admitting: Family Medicine

## 2016-03-16 ENCOUNTER — Encounter: Payer: Self-pay | Admitting: Family Medicine

## 2016-03-16 VITALS — BP 143/85 | HR 51 | Ht 65.0 in | Wt 160.0 lb

## 2016-03-16 DIAGNOSIS — H53123 Transient visual loss, bilateral: Secondary | ICD-10-CM | POA: Insufficient documentation

## 2016-03-16 DIAGNOSIS — M25512 Pain in left shoulder: Secondary | ICD-10-CM | POA: Diagnosis not present

## 2016-03-16 MED ORDER — METHYLPREDNISOLONE ACETATE 40 MG/ML IJ SUSP
40.0000 mg | Freq: Once | INTRAMUSCULAR | Status: AC
  Administered 2016-03-16: 40 mg via INTRA_ARTICULAR

## 2016-03-16 NOTE — Patient Instructions (Signed)
You have rotator cuff impingement with secondary biceps tendinitis, AC joint synovitis Try to avoid painful activities (overhead activities, lifting with extended arm) as much as possible. Celebrex twice a day with food for pain and inflammation. Can take tylenol in addition to this. Subacromial injection may be beneficial to help with pain and to decrease inflammation - you were given this today. Consider physical therapy with transition to home exercise program. Do home exercise program with theraband and scapular stabilization exercises daily - these are very important for long term relief even if an injection was given. 3 sets of 10 once a day (typically wait 5-7 days before starting this). If not improving at follow-up we will consider further imaging, physical therapy, and/or nitro patches. Follow up with me in 1 month.

## 2016-03-17 MED FILL — PREDNISOLONE AC 1% EYE DROP: 1 | 25 days supply | Qty: 5 | Fill #0

## 2016-03-17 MED FILL — OFLOXACIN 0.3% EYE DROPS: 0.3 | 12 days supply | Qty: 5 | Fill #0

## 2016-03-17 NOTE — Assessment & Plan Note (Signed)
Did well after injection about 1 1/2 years ago.  Has some secondary biceps tendinitis and AC synovitis now in addition to her impingement.  Discussed options - she would like to repeat injection, continue celebrex, resume home exercise program.  Consider ultrasound/MRI, physical therapy, nitro patches if not improving.  F/u in 1 month.  After informed written consent, patient was seated on exam table. Left shoulder was prepped with alcohol swab and utilizing posterior approach, patient's left subacromial space was injected with 3:1 bupivicaine: depomedrol. Patient tolerated the procedure well without immediate complications.

## 2016-03-17 NOTE — Progress Notes (Signed)
PCP: KNAPP,EVE A, MD  Subjective:   HPI: Patient is a 71 y.o. female here for left shoulder pain.  10/01/14: Patient states right hip is completely improved. She's having problems with left shoulder. Had surgery 9 years ago. About 18 months ago lifted a piece of luggage off the carousel and felt pull in left shoulder. Improved some and did it again, same mechanism last fall. Still with pain in this shoulder, worse at nighttime. Reports had an injection, PT, MRI (no access to this though) that was reassuring.  03/16/16: Patient reports she had done really well following injection until about 1 month ago. No acute injury or trauma. Pain with reaching backwards and lying on left side. Pain up to 4/10 but can have bursts of sharp stabbing pain. Left handed. No radiation of pain. No numbness or tingling. Takes celebrex 200 bid, fentanyl 20mcg patch for chronic pain, and norco for breakthrough.  Past Medical History:  Diagnosis Date  . Allergy   . Amaurosis fugax    negative w/u through [facility]  . Asthma   . Back pain    Dr Joline Salt 02/2010-epidural injection x 2 at L4-5 with good effect  . BCC (basal cell carcinoma of skin)    Dr. Denna Haggard  . Cataract    left  . Chronic pain   . Depression   . Diverticulosis of colon 1998   mild  . DJD (degenerative joint disease)   . Duodenal ulcer    h/o  . GERD (gastroesophageal reflux disease)   . History of SCC (squamous cell carcinoma) of skin    Dr. Denna Haggard  . History of sinus bradycardia   . HLD (hyperlipidemia)    hypertriglyceridemia  . IBS (irritable bowel syndrome)   . Internal hemorrhoids 1998  . Mitral regurgitation    mild  . Ocular migraine 10/31/14   Dr.Hecker  . Osteoarthritis    feet,shoulder,neck,back,hips and hands.  . Vitamin D deficiency    mild    Current Outpatient Prescriptions on File Prior to Visit  Medication Sig Dispense Refill  . acetaminophen (TYLENOL) 500 MG tablet Take 1,000 mg by mouth as  needed. Reported on 05/15/2015    . albuterol (PROVENTIL HFA;VENTOLIN HFA) 108 (90 BASE) MCG/ACT inhaler Inhale 2 puffs into the lungs every 6 (six) hours as needed for wheezing. (Patient not taking: Reported on 05/15/2015) 18 g 1  . aspirin EC 81 MG tablet Take 81 mg by mouth daily.    Marland Kitchen atorvastatin (LIPITOR) 40 MG tablet Take 1 tablet (40 mg total) by mouth daily. 90 tablet 1  . Calcium Carbonate-Vitamin D (CALCIUM 600+D) 600-200 MG-UNIT TABS Take 1 tablet by mouth 2 (two) times daily.      . celecoxib (CELEBREX) 200 MG capsule Take 1 capsule (200 mg total) by mouth 2 (two) times daily. 180 capsule 1  . cetirizine (ZYRTEC) 10 MG tablet Take 10 mg by mouth daily.      Marland Kitchen esomeprazole (NEXIUM) 40 MG capsule TAKE 1 CAPSULE BY MOUTH DAILY BEFORE SUPPER. 90 capsule 1  . estradiol (ESTRACE) 0.5 MG tablet Take 1 tablet (0.5 mg total) by mouth daily. 90 tablet 0  . fentaNYL (DURAGESIC - DOSED MCG/HR) 25 MCG/HR Place 1 patch onto the skin every 3 (three) days.      . fluorouracil (EFUDEX) 5 % cream Apply topically daily.    . fluticasone (FLONASE) 50 MCG/ACT nasal spray PLACE 2 SPRAYS INTO THE NOSE DAILY. 48 g 3  . fluticasone (FLOVENT HFA) 110 MCG/ACT  inhaler Inhale 1 puff into the lungs 2 (two) times daily. Rinse mouth after use 3 Inhaler 0  . gabapentin (NEURONTIN) 800 MG tablet Take 800 mg by mouth at bedtime.     . methocarbamol (ROBAXIN) 500 MG tablet Take 500 mg by mouth every 8 (eight) hours as needed for muscle spasms.    . montelukast (SINGULAIR) 10 MG tablet TAKE 1 TABLET BY MOUTH AT BEDTIME. 90 tablet PRN  . Multiple Vitamins-Minerals (MULTIVITAMIN WITH MINERALS) tablet Take 1 tablet by mouth daily.    Marland Kitchen omega-3 acid ethyl esters (LOVAZA) 1 g capsule TAKE 2 CAPSULES BY MOUTH 2 TIMES DAILY. 360 capsule 1  . PARoxetine (PAXIL) 30 MG tablet Take 30 mg by mouth daily.      No current facility-administered medications on file prior to visit.     Past Surgical History:  Procedure Laterality Date   . BUNIONECTOMY     R 12/08, L 2004 (Dr. Janus Molder)  . CARPAL TUNNEL RELEASE  1989   bilateral  . CATARACT EXTRACTION Left 08/2012   Dr.Hecker  . CESAREAN SECTION     x2  . CHOLECYSTECTOMY  1992  . COLONOSCOPY  2006  . COLONOSCOPY  01/2012   due again 01/2022; mild diverticulosis  . epidural steroid injection, back  02/2010  . HIP SURGERY     right bursectomy x 3  . HIP SURGERY Right 2000   torn cartilage, repaired  . INGUINAL HERNIA REPAIR  9/09   bilat  . NECK SURGERY  1989   c6-7 cervical laminectomy and diskecotmy  . SHOULDER SURGERY  2/05   left rotator cuff repair  . TONSILLECTOMY  age 53  . TOTAL ABDOMINAL HYSTERECTOMY W/ BILATERAL SALPINGOOPHORECTOMY  1997   fibroids  . TROCHANTERIC BURSA EXCISION Right 1978  . TUBAL LIGATION  1986  . WISDOM TOOTH EXTRACTION      Allergies  Allergen Reactions  . Adhesive [Tape] Rash  . Codeine Rash    Social History   Social History  . Marital status: Divorced    Spouse name: N/A  . Number of children: 2  . Years of education: N/A   Occupational History  . [employer]   Social History Main Topics  . Smoking status: Never Smoker  . Smokeless tobacco: Never Used  . Alcohol use 4.2 oz/week    7 Glasses of wine per week     Comment: 1 glass of wine daily.  . Drug use: No  . Sexual activity: Not Currently   Other Topics Concern  . Not on file   Social History Narrative   Divorced, lives alone, [place]; exercises 1 day per week (mowing grass).  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  Adopted a miniature poodle 08/2015 (11yo).   Son lives in [place] (2 grandchildren), daughter lives in [place], [place]    Family History  Problem Relation Age of Onset  . Depression Mother   . Schizophrenia Mother   . Cancer Father     oral  . Heart disease Father   . Cancer Sister     skin and lung  . COPD Sister   . Hypertension Sister   . Hyperthyroidism Brother   . Cancer Brother     metastatic  cancer to bone (?primary)  . Hyperlipidemia Daughter   . Asthma Son   . Cancer Paternal Grandmother 34    colon cancer, metastatic to liver  . Colon cancer Paternal Grandmother   . Diabetes Maternal Grandmother  BP (!) 143/85   Pulse (!) 51   Ht 5\' 5"  (1.651 m)   Wt 160 lb (72.6 kg)   BMI 26.63 kg/m   Review of Systems: See HPI above.    Objective:  Physical Exam:  Gen: NAD  Left shoulder: No swelling, ecchymoses.  No gross deformity. Mild TTP over biceps tendon and AC joint.  No other tenderness. FROM with painful arc and IR. Positive Hawkins, Neers. Negative Yergasons. Strength 5/5 with empty can and resisted internal/external rotation.  Pain empty can and IR. Negative apprehension. NV intact distally.    Assessment & Plan:  1. Left shoulder pain - Did well after injection about 1 1/2 years ago.  Has some secondary biceps tendinitis and AC synovitis now in addition to her impingement.  Discussed options - she would like to repeat injection, continue celebrex, resume home exercise program.  Consider ultrasound/MRI, physical therapy, nitro patches if not improving.  F/u in 1 month.  After informed written consent, patient was seated on exam table. Left shoulder was prepped with alcohol swab and utilizing posterior approach, patient's left subacromial space was injected with 3:1 bupivicaine: depomedrol. Patient tolerated the procedure well without immediate complications.

## 2016-03-22 NOTE — Telephone Encounter (Signed)
Appeal approved til 02/15/2017, pt informed

## 2016-03-23 ENCOUNTER — Encounter: Payer: Self-pay | Admitting: Family Medicine

## 2016-03-23 ENCOUNTER — Other Ambulatory Visit: Payer: Self-pay | Admitting: *Deleted

## 2016-03-23 DIAGNOSIS — N959 Unspecified menopausal and perimenopausal disorder: Secondary | ICD-10-CM

## 2016-03-23 MED ORDER — ESTRADIOL 0.5 MG PO TABS: 0.5000 mg | ORAL_TABLET | Freq: Every day | ORAL | 2 refills | Status: DC

## 2016-03-23 MED ORDER — OMEGA-3-ACID ETHYL ESTERS 1 G PO CAPS: ORAL_CAPSULE | ORAL | 1 refills | Status: DC

## 2016-03-23 MED FILL — OMEGA-3 ETHYL ESTERS 1 GM C: 1 | 90 days supply | Qty: 360 | Fill #0

## 2016-03-23 MED FILL — ESTRADIOL 0.5 MG TABLET: 0.5 | 90 days supply | Qty: 90 | Fill #0

## 2016-03-24 MED FILL — CELECOXIB 200 MG CAP: 200 | 90 days supply | Qty: 180 | Fill #0

## 2016-03-24 MED FILL — GABAPENTIN 800 MG TABLET: 800 | 90 days supply | Qty: 90 | Fill #0

## 2016-04-06 ENCOUNTER — Encounter: Payer: Self-pay | Admitting: Family Medicine

## 2016-04-06 ENCOUNTER — Ambulatory Visit (INDEPENDENT_AMBULATORY_CARE_PROVIDER_SITE_OTHER): Payer: Medicare Other | Admitting: Family Medicine

## 2016-04-06 DIAGNOSIS — M25512 Pain in left shoulder: Secondary | ICD-10-CM

## 2016-04-06 NOTE — Patient Instructions (Signed)
We will go ahead with an MRI of your shoulder at this point. I will call you the business day following the MRI to go over results and next steps.

## 2016-04-08 NOTE — Progress Notes (Signed)
PCP: KNAPP,EVE A, MD  Subjective:   HPI: Patient is a 71 y.o. female here for left shoulder pain.  10/01/14: Patient states right hip is completely improved. She's having problems with left shoulder. Had surgery 9 years ago. About 18 months ago lifted a piece of luggage off the carousel and felt pull in left shoulder. Improved some and did it again, same mechanism last fall. Still with pain in this shoulder, worse at nighttime. Reports had an injection, PT, MRI (no access to this though) that was reassuring.  03/16/16: Patient reports she had done really well following injection until about 1 month ago. No acute injury or trauma. Pain with reaching backwards and lying on left side. Pain up to 4/10 but can have bursts of sharp stabbing pain. Left handed. No radiation of pain. No numbness or tingling. Takes celebrex 200 bid, fentanyl 71mcg patch for chronic pain, and norco for breakthrough.  2/19: Patient reports her left shoulder feels worse. Pain level is 4/10, sharp but up to 10/10 at times. Worse when trying to get dressed or do hair. Pain anterior shoulder into chest and upper arm. Pain is constant. No skin changes, numbness.  Past Medical History:  Diagnosis Date  . Allergy   . Amaurosis fugax    negative w/u through [facility]  . Asthma   . Back pain    Dr Joline Salt 02/2010-epidural injection x 2 at L4-5 with good effect  . BCC (basal cell carcinoma of skin)    Dr. Denna Haggard  . Cataract    left  . Chronic pain   . Depression   . Diverticulosis of colon 1998   mild  . DJD (degenerative joint disease)   . Duodenal ulcer    h/o  . GERD (gastroesophageal reflux disease)   . History of SCC (squamous cell carcinoma) of skin    Dr. Denna Haggard  . History of sinus bradycardia   . HLD (hyperlipidemia)    hypertriglyceridemia  . IBS (irritable bowel syndrome)   . Internal hemorrhoids 1998  . Mitral regurgitation    mild  . Ocular migraine 10/31/14   Dr.Hecker  .  Osteoarthritis    feet,shoulder,neck,back,hips and hands.  . Vitamin D deficiency    mild    Current Outpatient Prescriptions on File Prior to Visit  Medication Sig Dispense Refill  . acetaminophen (TYLENOL) 500 MG tablet Take 1,000 mg by mouth as needed. Reported on 05/15/2015    . albuterol (PROVENTIL HFA;VENTOLIN HFA) 108 (90 BASE) MCG/ACT inhaler Inhale 2 puffs into the lungs every 6 (six) hours as needed for wheezing. (Patient not taking: Reported on 05/15/2015) 18 g 1  . aspirin EC 81 MG tablet Take 81 mg by mouth daily.    Marland Kitchen atorvastatin (LIPITOR) 40 MG tablet Take 1 tablet (40 mg total) by mouth daily. 90 tablet 1  . Calcium Carbonate-Vitamin D (CALCIUM 600+D) 600-200 MG-UNIT TABS Take 1 tablet by mouth 2 (two) times daily.      . celecoxib (CELEBREX) 200 MG capsule Take 1 capsule (200 mg total) by mouth 2 (two) times daily. 180 capsule 1  . cetirizine (ZYRTEC) 10 MG tablet Take 10 mg by mouth daily.      Marland Kitchen esomeprazole (NEXIUM) 40 MG capsule TAKE 1 CAPSULE BY MOUTH DAILY BEFORE SUPPER. 90 capsule 1  . estradiol (ESTRACE) 0.5 MG tablet Take 1 tablet (0.5 mg total) by mouth daily. 90 tablet 2  . fentaNYL (DURAGESIC - DOSED MCG/HR) 25 MCG/HR Place 1 patch onto the skin every  3 (three) days.      . fluorouracil (EFUDEX) 5 % cream Apply topically daily.    . fluticasone (FLONASE) 50 MCG/ACT nasal spray PLACE 2 SPRAYS INTO THE NOSE DAILY. 48 g 3  . fluticasone (FLOVENT HFA) 110 MCG/ACT inhaler Inhale 1 puff into the lungs 2 (two) times daily. Rinse mouth after use 3 Inhaler 0  . gabapentin (NEURONTIN) 800 MG tablet Take 800 mg by mouth at bedtime.     . methocarbamol (ROBAXIN) 500 MG tablet Take 500 mg by mouth every 8 (eight) hours as needed for muscle spasms.    . montelukast (SINGULAIR) 10 MG tablet TAKE 1 TABLET BY MOUTH AT BEDTIME. 90 tablet PRN  . Multiple Vitamins-Minerals (MULTIVITAMIN WITH MINERALS) tablet Take 1 tablet by mouth daily.    Marland Kitchen omega-3 acid ethyl esters (LOVAZA) 1 g  capsule TAKE 2 CAPSULES BY MOUTH 2 TIMES DAILY. 360 capsule 1  . PARoxetine (PAXIL) 30 MG tablet Take 30 mg by mouth daily.      No current facility-administered medications on file prior to visit.     Past Surgical History:  Procedure Laterality Date  . BUNIONECTOMY     R 12/08, L 2004 (Dr. Janus Molder)  . CARPAL TUNNEL RELEASE  1989   bilateral  . CATARACT EXTRACTION Left 08/2012   Dr.Hecker  . CESAREAN SECTION     x2  . CHOLECYSTECTOMY  1992  . COLONOSCOPY  2006  . COLONOSCOPY  01/2012   due again 01/2022; mild diverticulosis  . epidural steroid injection, back  02/2010  . HIP SURGERY     right bursectomy x 3  . HIP SURGERY Right 2000   torn cartilage, repaired  . INGUINAL HERNIA REPAIR  9/09   bilat  . NECK SURGERY  1989   c6-7 cervical laminectomy and diskecotmy  . SHOULDER SURGERY  2/05   left rotator cuff repair  . TONSILLECTOMY  age 11  . TOTAL ABDOMINAL HYSTERECTOMY W/ BILATERAL SALPINGOOPHORECTOMY  1997   fibroids  . TROCHANTERIC BURSA EXCISION Right 1978  . TUBAL LIGATION  1986  . WISDOM TOOTH EXTRACTION      Allergies  Allergen Reactions  . Adhesive [Tape] Rash  . Codeine Rash    Social History   Social History  . Marital status: Divorced    Spouse name: N/A  . Number of children: 2  . Years of education: N/A   Occupational History  . [employer]   Social History Main Topics  . Smoking status: Never Smoker  . Smokeless tobacco: Never Used  . Alcohol use 4.2 oz/week    7 Glasses of wine per week     Comment: 1 glass of wine daily.  . Drug use: No  . Sexual activity: Not Currently   Other Topics Concern  . Not on file   Social History Narrative   Divorced, lives alone, [place]; exercises 1 day per week (mowing grass).  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  Adopted a miniature poodle 08/2015 (11yo).   Son lives in [place] (2 grandchildren), daughter lives in [place], [place]    Family History  Problem Relation  Age of Onset  . Depression Mother   . Schizophrenia Mother   . Cancer Father     oral  . Heart disease Father   . Cancer Sister     skin and lung  . COPD Sister   . Hypertension Sister   . Hyperthyroidism Brother   . Cancer Brother  metastatic cancer to bone (?primary)  . Hyperlipidemia Daughter   . Asthma Son   . Cancer Paternal Grandmother 85    colon cancer, metastatic to liver  . Colon cancer Paternal Grandmother   . Diabetes Maternal Grandmother     BP 140/74   Pulse (!) 59   Ht 5\' 5"  (1.651 m)   Wt 160 lb (72.6 kg)   BMI 26.63 kg/m   Review of Systems: See HPI above.    Objective:  Physical Exam:  Gen: NAD  Left shoulder: No swelling, ecchymoses.  No gross deformity. Mild TTP over biceps tendon and AC joint.  No other tenderness. FROM with painful arc. Positive Hawkins, Neers. Negative Yergasons. Strength 5/5 with empty can and resisted internal/external rotation.   Negative apprehension. NV intact distally.    Assessment & Plan:  1. Left shoulder pain - Patient not responding as well to subacromial injection this time.  Exam still fits with impingement though may have partial rotator cuff tear or biceps tendinopathy.  Given severity of pain opted to go ahead with MRI at this point.  Continue home exercises, celebrex.

## 2016-04-08 NOTE — Assessment & Plan Note (Signed)
Patient not responding as well to subacromial injection this time.  Exam still fits with impingement though may have partial rotator cuff tear or biceps tendinopathy.  Given severity of pain opted to go ahead with MRI at this point.  Continue home exercises, celebrex.

## 2016-04-13 MED FILL — PREDNISOLONE AC 1% EYE DROP: 1 | 25 days supply | Qty: 5 | Fill #1

## 2016-04-20 MED FILL — ESOMEPRAZOLE MAG DR 40 MG C: 40 | 90 days supply | Qty: 90 | Fill #0

## 2016-05-03 ENCOUNTER — Encounter: Payer: Self-pay | Admitting: Family Medicine

## 2016-05-04 ENCOUNTER — Encounter: Payer: Self-pay | Admitting: *Deleted

## 2016-05-11 MED FILL — MONTELUKAST SOD 10 MG TAB: 10 | 90 days supply | Qty: 90 | Fill #1

## 2016-05-20 ENCOUNTER — Encounter: Payer: Self-pay | Admitting: Family Medicine

## 2016-05-21 ENCOUNTER — Other Ambulatory Visit: Payer: Self-pay | Admitting: *Deleted

## 2016-05-21 MED ORDER — ATORVASTATIN CALCIUM 40 MG PO TABS: 40.0000 mg | ORAL_TABLET | Freq: Every day | ORAL | 0 refills | Status: DC

## 2016-05-21 MED FILL — ATORVASTATIN 40 MG TABLET: 40 | 90 days supply | Qty: 90 | Fill #0

## 2016-06-02 ENCOUNTER — Telehealth: Payer: Self-pay | Admitting: Family Medicine

## 2016-06-02 NOTE — Telephone Encounter (Signed)
Review of my last note: "Return in 3 mos for fasting med check--due then for c-met, lipids, TSH, CBC and spirometry at visit."  Truly needs an office visit for the spirometry, and will draw labs at visit.

## 2016-06-02 NOTE — Telephone Encounter (Signed)
Pt called wanting to confirm that she really needs an office visit on 06/11/16. Pt's understanding was that she only needs a nurse visit.

## 2016-06-04 NOTE — Telephone Encounter (Signed)
Called and left message advising pt

## 2016-06-10 NOTE — Progress Notes (Signed)
Chief Complaint  Patient presents with  . Follow-up    pt reports concerns with allergy/sinus headaches reports more pressure on left side of head. postnasal drainage. pt reports Tuesday she had issues with reflux all day long, took tums with no relief. Had an episode of nausea and hot flash. co-workers were concerned with being pale and sweaty. Concerned with possible MI.    Woke up with indigestion 2 days ago, lasted all day long.  She had associated nausea, was sweating while at work. She left work a little early.  She was told she looked pale.  Indigestion didn't go away until she went to bed.  Woke up yesterday morning and was fine, no further problems. She had taken pepcid and tums without benefit.  Certain positions made it feel better.  She had some abdominal bloating as well. Nausea, excess salivation, never vomited.  Stools were normal. Denies any change in diet the day before. She takes nexium every night--denies missing dose. Patient has known GERD, and previously was doing well on Nexium every evening (takes after work). She has recurrent heartburn if she misses a pill, or if she eats too late at night (rarely happens). Denies dysphagia. Unclear why she felt so sick 2 days ago--concerned it could be her heart. No cardiac family history  Asthma: Reports breathing has been very good--only needs to use rescue inhaler when she has an illness/URI (hasn't recently). Uses Flovent and Singulair regularly without side effects.  Allergies: She stays on zyrtec, flonase and singulair year-round. Currently allergies are well controlled, some increased drainage and sinus pressure on the left (PND) related to pollen currently.  Hyperlipidemia follow-up: Patient is reportedly following a low-fat, low cholesterol diet. Compliant with atorvastatin and denies any side effects. She is taking Lovaza--tolerating it much better than fish oil (which caused gas).  PMH, PSH, SH and FH were reviewed  today  Outpatient Encounter Prescriptions as of 06/11/2016  Medication Sig Note  . acetaminophen (TYLENOL) 500 MG tablet Take 1,000 mg by mouth as needed. Reported on 05/15/2015   . albuterol (PROVENTIL HFA;VENTOLIN HFA) 108 (90 BASE) MCG/ACT inhaler Inhale 2 puffs into the lungs every 6 (six) hours as needed for wheezing.   Marland Kitchen aspirin EC 81 MG tablet Take 81 mg by mouth daily.   Marland Kitchen atorvastatin (LIPITOR) 40 MG tablet Take 1 tablet (40 mg total) by mouth daily.   . beclomethasone (QVAR) 80 MCG/ACT inhaler Inhale into the lungs.   . Calcium Carbonate-Vitamin D (CALCIUM 600+D) 600-200 MG-UNIT TABS Take 1 tablet by mouth 2 (two) times daily.     . celecoxib (CELEBREX) 200 MG capsule Take 1 capsule (200 mg total) by mouth 2 (two) times daily.   . cetirizine (ZYRTEC) 10 MG tablet Take 10 mg by mouth daily.     Marland Kitchen esomeprazole (NEXIUM) 40 MG capsule TAKE 1 CAPSULE BY MOUTH DAILY BEFORE SUPPER.   Marland Kitchen estradiol (ESTRACE) 0.5 MG tablet Take 1 tablet (0.5 mg total) by mouth daily.   . fentaNYL (DURAGESIC - DOSED MCG/HR) 25 MCG/HR Place 1 patch onto the skin every 3 (three) days.     . fluticasone (FLONASE) 50 MCG/ACT nasal spray PLACE 2 SPRAYS INTO THE NOSE DAILY.   Marland Kitchen gabapentin (NEURONTIN) 800 MG tablet Take 800 mg by mouth at bedtime.    . montelukast (SINGULAIR) 10 MG tablet TAKE 1 TABLET BY MOUTH AT BEDTIME.   . Multiple Vitamins-Minerals (MULTIVITAMIN WITH MINERALS) tablet Take 1 tablet by mouth daily.   Marland Kitchen omega-3  acid ethyl esters (LOVAZA) 1 g capsule TAKE 2 CAPSULES BY MOUTH 2 TIMES DAILY.   Marland Kitchen PARoxetine (PAXIL) 30 MG tablet Take 30 mg by mouth daily.  10/02/2014: Received from: External Pharmacy  . methocarbamol (ROBAXIN) 500 MG tablet Take 500 mg by mouth every 8 (eight) hours as needed for muscle spasms. 05/15/2015: Takes once daily, in the morning  . [DISCONTINUED] fluorouracil (EFUDEX) 5 % cream Apply topically daily.   . [DISCONTINUED] fluticasone (FLOVENT HFA) 110 MCG/ACT inhaler Inhale 1 puff into  the lungs 2 (two) times daily. Rinse mouth after use 06/11/2016: Switched to Qvar due to formulary change   No facility-administered encounter medications on file as of 06/11/2016.    ROS:  No headaches, dizziness, syncope, chest pain, shortness of breath.  Indigestion with some diaphoresis and nausea 2 days ago, resolved.  Allergies controlled, slight PND, per HPI. No fever, chills, cough. No urinary complaints, bleeding, bruising.  +chronic pain.  PHYSICAL EXAM:  BP 118/70   Pulse (!) 50   Resp 16   Ht 5' 5" (1.651 m)   Wt 165 lb (74.8 kg)   SpO2 97%   BMI 27.46 kg/m   Well appearing, pleasant female, in no distress HEENT: conjunctiva and sclera are clear, OP clear. Nasal mucosa mildly edematous. Neck: no lymphadenopathy, thyromegaly or carotid bruit Heart: regular rate and rhythm, no murmur Lungs: clear bilaterally.  Good air movement, no wheezes, rales, ronchi Abdomen: soft, nontender, no epigastric tenderness, no organomegaly or mass Back: no CVA tenderness Extremities: no edema, normal pulses Skin: normal turgor, no rash Psych: normal mood, affect, hygiene and grooming  EKG:  Sinus bradycardia (49), 1st degree AV block. Incomplete RBBB. No acute abnormalities noted.  Spirometry--normal  ASSESSMENT/PLAN:  Asthma in remission - continue current regimen. normal spirometry today - Plan: Spirometry with graph  Mixed hyperlipidemia - due for lipids - Plan: Lipid panel, Comprehensive metabolic panel  Medication monitoring encounter - Plan: Lipid panel, CBC with Differential/Platelet, Comprehensive metabolic panel  Gastroesophageal reflux disease, esophagitis presence not specified - has been controlled; severe day-long episode of indigestion/nausea/diaphoresis--?GERD vs Cardiac vs viral; resolved now  Osteoarthritis of multiple joints, unspecified osteoarthritis type - Plan: celecoxib (CELEBREX) 200 MG capsule  Other fatigue - Plan: TSH  Nausea - Plan: EKG  12-Lead  Diaphoresis - 2 days ago, associalted with indigestion and nausea. Resolved now - Plan: EKG 12-Lead   c-met, lipids, TSH, CBC  Refer cardiology--consider stress test. ?stop estrogen--discussed as risk factor  Continue for now--await cardiology's recommendations. Continue 26m aspirin daily.   CPE due in January 2019

## 2016-06-11 ENCOUNTER — Encounter: Payer: Self-pay | Admitting: Family Medicine

## 2016-06-11 ENCOUNTER — Ambulatory Visit (INDEPENDENT_AMBULATORY_CARE_PROVIDER_SITE_OTHER): Payer: Medicare Other | Admitting: Family Medicine

## 2016-06-11 VITALS — BP 118/70 | HR 50 | Resp 16 | Ht 65.0 in | Wt 165.0 lb

## 2016-06-11 DIAGNOSIS — R61 Generalized hyperhidrosis: Secondary | ICD-10-CM | POA: Diagnosis not present

## 2016-06-11 DIAGNOSIS — R110 Nausea: Secondary | ICD-10-CM

## 2016-06-11 DIAGNOSIS — K219 Gastro-esophageal reflux disease without esophagitis: Secondary | ICD-10-CM

## 2016-06-11 DIAGNOSIS — M159 Polyosteoarthritis, unspecified: Secondary | ICD-10-CM

## 2016-06-11 DIAGNOSIS — Z5181 Encounter for therapeutic drug level monitoring: Secondary | ICD-10-CM

## 2016-06-11 DIAGNOSIS — R5383 Other fatigue: Secondary | ICD-10-CM

## 2016-06-11 DIAGNOSIS — E782 Mixed hyperlipidemia: Secondary | ICD-10-CM

## 2016-06-11 DIAGNOSIS — J45998 Other asthma: Secondary | ICD-10-CM | POA: Diagnosis not present

## 2016-06-11 LAB — CBC WITH DIFFERENTIAL/PLATELET
Basophils Absolute: 0 cells/uL (ref 0–200)
Basophils Relative: 0 %
EOS PCT: 1 %
Eosinophils Absolute: 79 cells/uL (ref 15–500)
HEMATOCRIT: 42.5 % (ref 35.0–45.0)
Hemoglobin: 14 g/dL (ref 11.7–15.5)
LYMPHS PCT: 22 %
Lymphs Abs: 1738 cells/uL (ref 850–3900)
MCH: 31.3 pg (ref 27.0–33.0)
MCHC: 32.9 g/dL (ref 32.0–36.0)
MCV: 95.1 fL (ref 80.0–100.0)
MPV: 10.7 fL (ref 7.5–12.5)
Monocytes Absolute: 711 cells/uL (ref 200–950)
Monocytes Relative: 9 %
NEUTROS PCT: 68 %
Neutro Abs: 5372 cells/uL (ref 1500–7800)
Platelets: 165 10*3/uL (ref 140–400)
RBC: 4.47 MIL/uL (ref 3.80–5.10)
RDW: 15.2 % — AB (ref 11.0–15.0)
WBC: 7.9 10*3/uL (ref 4.0–10.5)

## 2016-06-11 LAB — LIPID PANEL
CHOL/HDL RATIO: 4 ratio (ref <5.0)
Cholesterol: 151 mg/dL (ref <200)
HDL: 38 mg/dL — ABNORMAL LOW (ref >50)
LDL CALC: 68 mg/dL (ref <100)
Triglycerides: 226 mg/dL — ABNORMAL HIGH (ref <150)
VLDL: 45 mg/dL — ABNORMAL HIGH (ref <30)

## 2016-06-11 LAB — COMPREHENSIVE METABOLIC PANEL
ALBUMIN: 4.4 g/dL (ref 3.6–5.1)
ALT: 23 U/L (ref 6–29)
AST: 26 U/L (ref 10–35)
Alkaline Phosphatase: 71 U/L (ref 33–130)
BUN: 18 mg/dL (ref 7–25)
CHLORIDE: 104 mmol/L (ref 98–110)
CO2: 25 mmol/L (ref 20–31)
CREATININE: 0.71 mg/dL (ref 0.50–0.99)
Calcium: 9.7 mg/dL (ref 8.6–10.4)
Glucose, Bld: 105 mg/dL — ABNORMAL HIGH (ref 65–99)
POTASSIUM: 4.2 mmol/L (ref 3.5–5.3)
SODIUM: 141 mmol/L (ref 135–146)
Total Bilirubin: 0.8 mg/dL (ref 0.2–1.2)
Total Protein: 6.4 g/dL (ref 6.1–8.1)

## 2016-06-11 LAB — TSH: TSH: 1.32 m[IU]/L

## 2016-06-11 MED ORDER — CELECOXIB 200 MG PO CAPS: 200.0000 mg | ORAL_CAPSULE | Freq: Two times a day (BID) | ORAL | 1 refills | Status: DC

## 2016-06-12 MED FILL — CELECOXIB 200 MG CAP: 200 | 90 days supply | Qty: 180 | Fill #0

## 2016-06-15 ENCOUNTER — Ambulatory Visit: Payer: Medicare Other | Admitting: Family Medicine

## 2016-06-17 MED FILL — OMEGA-3 ETHYL ESTERS 1 GM C: 1 | 90 days supply | Qty: 360 | Fill #1

## 2016-06-17 MED FILL — GABAPENTIN 800 MG TABLET: 800 | 90 days supply | Qty: 90 | Fill #0

## 2016-06-18 MED FILL — ESTRADIOL 0.5 MG TABLET: 0.5 | 90 days supply | Qty: 90 | Fill #1

## 2016-06-25 ENCOUNTER — Encounter: Payer: Self-pay | Admitting: Cardiology

## 2016-06-28 NOTE — Progress Notes (Signed)
Cardiology Office Note NEW PATIENT VISIT   Date:  06/29/2016   ID:  Beverly [name], DOB [birth year], MRN [id number]  PCP:  Rita Ohara, MD  Cardiologist:  NEW Dr. Johnsie Cancel    Chief Complaint  Patient presents with  . Chest Pain      History of Present Illness: Beverly [name] is a 71 y.o. female who presents for episode of chest/upper abd pressure on 06/09/16. She woke without problem and ate BK then developed upper abd pressure.  No SOB but + nausea and diaphoresis.  It stayed with her all day and she went to sleep with it.  Her appetite was decreased as well.  She woke the next day without problems.   She is on lipitor hor HLD, last LDL was 68.  +  cardiac hx in father.  She has mild sleep apnea and uses oral device and is resting better.  She has a hx of asthma but only rare episode.   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.    Her HR is slow to 46 at times and when she stands on occ. She feels lightheaded.  In 2016 she had amaurosis fugax and echo was normal, carotids normal.  She did wear a monitor and had a couple runs of SVT and HR to 41.     Today no further episodes of the discomfort.  She feels well. Does not exercise much.    Past Medical History:  Diagnosis Date  . Allergy   . Amaurosis fugax    negative w/u through [facility]  . Asthma   . Back pain    Dr Joline Salt 02/2010-epidural injection x 2 at L4-5 with good effect  . BCC (basal cell carcinoma of skin)    Dr. Denna Haggard  . Cataract    left  . Chronic pain   . Depression   . Diverticulosis of colon 1998   mild  . DJD (degenerative joint disease)   . Duodenal ulcer    h/o  . GERD (gastroesophageal reflux disease)   . History of SCC (squamous cell carcinoma) of skin    Dr. Denna Haggard  . History of sinus bradycardia   . HLD (hyperlipidemia)    hypertriglyceridemia  . IBS (irritable bowel syndrome)   . Internal hemorrhoids 1998  . Mitral regurgitation    mild  . Ocular migraine 10/31/14   Dr.Hecker    . Osteoarthritis    feet,shoulder,neck,back,hips and hands.  . Vitamin D deficiency    mild    Past Surgical History:  Procedure Laterality Date  . BUNIONECTOMY     R 12/08, L 2004 (Dr. Janus Molder)  . CARPAL TUNNEL RELEASE  1989   bilateral  . CATARACT EXTRACTION Left 08/2012   Dr.Hecker  . CESAREAN SECTION     x2  . CHOLECYSTECTOMY  1992  . COLONOSCOPY  2006  . COLONOSCOPY  01/2012   due again 01/2022; mild diverticulosis  . epidural steroid injection, back  02/2010  . HIP SURGERY     right bursectomy x 3  . HIP SURGERY Right 2000   torn cartilage, repaired  . INGUINAL HERNIA REPAIR  9/09   bilat  . NECK SURGERY  1989   c6-7 cervical laminectomy and diskecotmy  . SHOULDER SURGERY  2/05   left rotator cuff repair  . TONSILLECTOMY  age 64  . TOTAL ABDOMINAL HYSTERECTOMY W/ BILATERAL SALPINGOOPHORECTOMY  1997   fibroids  . TROCHANTERIC BURSA EXCISION Right 1978  .  TUBAL LIGATION  1986  . WISDOM TOOTH EXTRACTION       Current Outpatient Prescriptions  Medication Sig Dispense Refill  . acetaminophen (TYLENOL) 500 MG tablet Take 1,000 mg by mouth as needed. Reported on 05/15/2015    . albuterol (PROVENTIL HFA;VENTOLIN HFA) 108 (90 Base) MCG/ACT inhaler Inhale 2 puffs into the lungs every 6 (six) hours as needed for wheezing or shortness of breath.    Marland Kitchen aspirin EC 81 MG tablet Take 81 mg by mouth daily.    Marland Kitchen atorvastatin (LIPITOR) 40 MG tablet Take 1 tablet (40 mg total) by mouth daily. 90 tablet 0  . beclomethasone (QVAR) 80 MCG/ACT inhaler Inhale 1 puff into the lungs 2 (two) times daily.     . Calcium Carbonate-Vitamin D (CALCIUM 600+D) 600-200 MG-UNIT TABS Take 1 tablet by mouth 2 (two) times daily.      . celecoxib (CELEBREX) 200 MG capsule Take 1 capsule (200 mg total) by mouth 2 (two) times daily. 180 capsule 1  . cetirizine (ZYRTEC) 10 MG tablet Take 10 mg by mouth daily.      Marland Kitchen esomeprazole (NEXIUM) 40 MG capsule TAKE 1 CAPSULE BY MOUTH DAILY BEFORE SUPPER. 90  capsule 1  . estradiol (ESTRACE) 0.5 MG tablet Take 1 tablet (0.5 mg total) by mouth daily. 90 tablet 2  . fentaNYL (DURAGESIC - DOSED MCG/HR) 25 MCG/HR Place 1 patch onto the skin every 3 (three) days.      . fluticasone (FLONASE) 50 MCG/ACT nasal spray PLACE 2 SPRAYS INTO THE NOSE DAILY. 48 g 3  . gabapentin (NEURONTIN) 800 MG tablet Take 800 mg by mouth at bedtime.     . methocarbamol (ROBAXIN) 500 MG tablet Take 500 mg by mouth daily with breakfast.    . montelukast (SINGULAIR) 10 MG tablet TAKE 1 TABLET BY MOUTH AT BEDTIME. 90 tablet PRN  . Multiple Vitamins-Minerals (MULTIVITAMIN WITH MINERALS) tablet Take 1 tablet by mouth daily.    Marland Kitchen omega-3 acid ethyl esters (LOVAZA) 1 g capsule TAKE 2 CAPSULES BY MOUTH 2 TIMES DAILY. 360 capsule 1  . PARoxetine (PAXIL) 30 MG tablet Take 30 mg by mouth daily.      No current facility-administered medications for this visit.     Allergies:   Adhesive [tape] and Codeine    Social History:  The patient  reports that she has never smoked. She has never used smokeless tobacco. She reports that she drinks about 4.2 oz of alcohol per week . She reports that she does not use drugs.   Family History:  The patient's family history includes Asthma in her son; COPD in her sister; Cancer in her brother, father, and sister; Cancer (age of onset: 36) in her paternal grandmother; Colon cancer in her paternal grandmother; Depression in her mother; Diabetes in her maternal grandmother; Heart disease in her father; Hyperlipidemia in her daughter; Hypertension in her sister; Hyperthyroidism in her brother; Schizophrenia in her mother.    ROS:  General:no colds or fevers, slow increase of weight  Skin:no rashes or ulcers HEENT:no blurred vision, no congestion CV:see HPI PUL:see HPI GI:no diarrhea constipation or melena, + hx of indigestion takes nexium and usually no break through.   GU:no hematuria, no dysuria MS:no joint pain, no claudication Neuro:no syncope,  occ lightheadedness Endo:no diabetes, no thyroid disease  Wt Readings from Last 3 Encounters:  06/29/16 168 lb (76.2 kg)  06/11/16 165 lb (74.8 kg)  04/06/16 160 lb (72.6 kg)     PHYSICAL EXAM: VS:  BP (!) 135/56   Pulse (!) 54   Ht 5\' 5"  (1.651 m)   Wt 168 lb (76.2 kg)   BMI 27.96 kg/m  , BMI Body mass index is 27.96 kg/m. General:Pleasant affect, NAD Skin:Warm and dry, brisk capillary refill HEENT:normocephalic, sclera clear, mucus membranes moist Neck:supple, no JVD, no bruits  Heart:S1S2 RRR without murmur, gallup, rub or click Lungs:clear without rales, rhonchi, or wheezes GDJ:MEQA, non tender, + BS, do not palpate liver spleen or masses Ext:no lower ext edema, 2+ pedal pulses, 2+ radial pulses Neuro:alert and oriented X 3, MAE, follows commands, + facial symmetry    EKG:  EKG is ordered today. The ekg ordered today demonstrates SBrady with 1st degree AV block incomplete  RBBB non specfic ST and T wave abnormality    Recent Labs: 06/11/2016: ALT 23; BUN 18; Creat 0.71; Hemoglobin 14.0; Platelets 165; Potassium 4.2; Sodium 141; TSH 1.32    Lipid Panel    Component Value Date/Time   CHOL 151 06/11/2016 1124   TRIG 226 (H) 06/11/2016 1124   HDL 38 (L) 06/11/2016 1124   CHOLHDL 4.0 06/11/2016 1124   VLDL 45 (H) 06/11/2016 1124   LDLCALC 68 06/11/2016 1124       Other studies Reviewed: Additional studies/ records that were reviewed today include: office visit notes and echo from 2016.  Echo 2016 Study Conclusions  - Left ventricle: The cavity size was normal. Systolic function was   normal. The estimated ejection fraction was in the range of 55%   to 60%. Wall motion was normal; there were no regional wall   motion abnormalities. - Mitral valve: There was mild regurgitation. - Pericardium, extracardiac: A trivial pericardial effusion was   identified.  Impressions:  - No cardiac source of emboli was indentified  ASSESSMENT AND PLAN:  1.  Chest  pain will do exercise myoview with abnormal EKG.  If unable to get HR elevated then it should be changed to lesixscan.   Discussed with Dr. Johnsie Cancel and she will follow up with Dr. Johnsie Cancel.  2. SB with 1 st degree AV block and incomplete RBBB.  Briefly discussed possibility of PPM in future but currently asymptomatic.   3.  HLD on mediation and last LDL68  4. GERD on nexium  5. Hx of asthma    Current medicines are reviewed with the patient today.  The patient Has no concerns regarding medicines.  The following changes have been made:  See above Labs/ tests ordered today include:see above  Disposition:   FU:  see above  Signed, Cecilie Kicks, NP  06/29/2016 11:55 AM    [facility] [address], [place], [address] [place], [place] Phone: [phone]; Fax: [phone]

## 2016-06-29 ENCOUNTER — Encounter: Payer: Self-pay | Admitting: Cardiology

## 2016-06-29 ENCOUNTER — Ambulatory Visit (INDEPENDENT_AMBULATORY_CARE_PROVIDER_SITE_OTHER): Payer: Medicare Other | Admitting: Cardiology

## 2016-06-29 VITALS — BP 135/56 | HR 54 | Ht 65.0 in | Wt 168.0 lb

## 2016-06-29 DIAGNOSIS — I440 Atrioventricular block, first degree: Secondary | ICD-10-CM | POA: Diagnosis not present

## 2016-06-29 DIAGNOSIS — K219 Gastro-esophageal reflux disease without esophagitis: Secondary | ICD-10-CM | POA: Diagnosis not present

## 2016-06-29 DIAGNOSIS — R001 Bradycardia, unspecified: Secondary | ICD-10-CM

## 2016-06-29 DIAGNOSIS — E782 Mixed hyperlipidemia: Secondary | ICD-10-CM | POA: Diagnosis not present

## 2016-06-29 DIAGNOSIS — R079 Chest pain, unspecified: Secondary | ICD-10-CM

## 2016-06-29 NOTE — Patient Instructions (Signed)
Medication Instructions:  Your physician recommends that you continue on your current medications as directed. Please refer to the Current Medication list given to you today.   Labwork: -None  Testing/Procedures: Your physician has requested that you have en exercise stress myoview. For further information please visit [email or web address]. Please follow instruction sheet, as given.    Follow-Up: Your physician recommends that you keep scheduled follow-up appointment with Dr. Johnsie Cancel.   Any Other Special Instructions Will Be Listed Below (If Applicable).     If you need a refill on your cardiac medications before your next appointment, please call your pharmacy.

## 2016-07-01 ENCOUNTER — Telehealth (HOSPITAL_COMMUNITY): Payer: Self-pay | Admitting: *Deleted

## 2016-07-01 NOTE — Telephone Encounter (Signed)
Patient given detailed instructions per Myocardial Perfusion Study Information Sheet for the test on 07/06/16. Patient notified to arrive 15 minutes early and that it is imperative to arrive on time for appointment to keep from having the test rescheduled.  If you need to cancel or reschedule your appointment, please call the office within 24 hours of your appointment. Failure to do so may result in a cancellation of your appointment, and a $50 no show fee. Patient verbalized understanding. Kirstie Peri

## 2016-07-02 ENCOUNTER — Encounter: Payer: Self-pay | Admitting: Family Medicine

## 2016-07-02 MED ORDER — ALBUTEROL SULFATE HFA 108 (90 BASE) MCG/ACT IN AERS: 2.0000 | INHALATION_SPRAY | Freq: Four times a day (QID) | RESPIRATORY_TRACT | 1 refills | Status: DC | PRN

## 2016-07-06 ENCOUNTER — Ambulatory Visit (HOSPITAL_COMMUNITY): Payer: Medicare Other | Attending: Cardiology

## 2016-07-06 DIAGNOSIS — R079 Chest pain, unspecified: Secondary | ICD-10-CM | POA: Diagnosis not present

## 2016-07-06 LAB — MYOCARDIAL PERFUSION IMAGING
CHL CUP NUCLEAR SDS: 1
CSEPEDS: 1 s
CSEPHR: 88 %
Estimated workload: 7.3 METS
Exercise duration (min): 7 min
LVDIAVOL: 89 mL (ref 46–106)
LVSYSVOL: 28 mL
MPHR: 152 {beats}/min
Peak HR: 134 {beats}/min
RATE: 0.33
Rest HR: 48 {beats}/min
SRS: 1
SSS: 2
TID: 1.09

## 2016-07-06 MED ORDER — TECHNETIUM TC 99M TETROFOSMIN IV KIT
32.5000 | PACK | Freq: Once | INTRAVENOUS | Status: AC | PRN
  Administered 2016-07-06: 32.5 via INTRAVENOUS
  Filled 2016-07-06: qty 33

## 2016-07-06 MED ORDER — TECHNETIUM TC 99M TETROFOSMIN IV KIT
11.0000 | PACK | Freq: Once | INTRAVENOUS | Status: AC | PRN
  Administered 2016-07-06: 11 via INTRAVENOUS
  Filled 2016-07-06: qty 11

## 2016-07-10 ENCOUNTER — Encounter: Payer: Self-pay | Admitting: Cardiovascular Disease

## 2016-07-20 MED FILL — FLOVENT HFA 110 MCG INHALER: 110 | 90 days supply | Qty: 36 | Fill #0

## 2016-07-20 MED FILL — PROAIR HFA 90 MCG INHALER: 108 (90 BAS | 25 days supply | Qty: 9 | Fill #0

## 2016-08-14 ENCOUNTER — Other Ambulatory Visit: Payer: Self-pay | Admitting: Family Medicine

## 2016-08-14 MED FILL — MONTELUKAST SOD 10 MG TAB: 10 | 90 days supply | Qty: 90 | Fill #0

## 2016-08-14 MED FILL — ESOMEPRAZOLE MAG DR 40 MG C: 40 | 90 days supply | Qty: 90 | Fill #0

## 2016-08-19 ENCOUNTER — Encounter: Payer: Self-pay | Admitting: Family Medicine

## 2016-08-21 ENCOUNTER — Other Ambulatory Visit: Payer: Self-pay | Admitting: Family Medicine

## 2016-08-21 MED FILL — ATORVASTATIN 40 MG TABLET: 40 | 90 days supply | Qty: 90 | Fill #0

## 2016-08-24 ENCOUNTER — Encounter: Payer: Self-pay | Admitting: Family Medicine

## 2016-08-26 ENCOUNTER — Ambulatory Visit: Payer: Self-pay | Admitting: Family Medicine

## 2016-08-27 ENCOUNTER — Encounter: Payer: Self-pay | Admitting: Family Medicine

## 2016-08-27 ENCOUNTER — Ambulatory Visit (INDEPENDENT_AMBULATORY_CARE_PROVIDER_SITE_OTHER): Payer: Medicare Other | Admitting: Family Medicine

## 2016-08-27 DIAGNOSIS — M25551 Pain in right hip: Secondary | ICD-10-CM | POA: Diagnosis not present

## 2016-08-27 NOTE — Assessment & Plan Note (Signed)
2/2 overuse strain of hip external rotators.  Icing, continue celebrex.  Shown home exercises and stretches to do daily and how to advance these.  Tennis ball massage.  Consider physical therapy, different NSAID.  F/u in 5-6 weeks.

## 2016-08-27 NOTE — Progress Notes (Signed)
PCP: Rita Ohara, MD  Subjective:   HPI: Patient is a 71 y.o. female here for right hip pain.  Patient reports she was working in the yard on 7/7 and had no injuries or trauma. States the next day woke up with severe pain posterior hip/buttock area up to 10/10 at times, sharp. Pain down to 0/10 at rest. Worse with walking, stairs. Worse at nighttime. No numbness or tingling. Has been icing, using voltaren gel. No bowel/bladder dysfunction. No back pain. Feels different from prior IT band syndrome/bursitis but does radiate down right leg some.  Past Medical History:  Diagnosis Date  . Allergy   . Amaurosis fugax    negative w/u through [facility]  . Asthma   . Back pain    Dr Joline Salt 02/2010-epidural injection x 2 at L4-5 with good effect  . BCC (basal cell carcinoma of skin)    Dr. Denna Haggard  . Cataract    left  . Chronic pain   . Depression   . Diverticulosis of colon 1998   mild  . DJD (degenerative joint disease)   . Duodenal ulcer    h/o  . GERD (gastroesophageal reflux disease)   . History of SCC (squamous cell carcinoma) of skin    Dr. Denna Haggard  . History of sinus bradycardia   . HLD (hyperlipidemia)    hypertriglyceridemia  . IBS (irritable bowel syndrome)   . Internal hemorrhoids 1998  . Mitral regurgitation    mild  . Ocular migraine 10/31/14   Dr.Hecker  . Osteoarthritis    feet,shoulder,neck,back,hips and hands.  . Vitamin D deficiency    mild    Current Outpatient Prescriptions on File Prior to Visit  Medication Sig Dispense Refill  . acetaminophen (TYLENOL) 500 MG tablet Take 1,000 mg by mouth as needed. Reported on 05/15/2015    . albuterol (PROVENTIL HFA;VENTOLIN HFA) 108 (90 Base) MCG/ACT inhaler Inhale 2 puffs into the lungs every 6 (six) hours as needed for wheezing or shortness of breath. 18 g 1  . aspirin EC 81 MG tablet Take 81 mg by mouth daily.    Marland Kitchen atorvastatin (LIPITOR) 40 MG tablet TAKE 1 TABLET BY MOUTH DAILY. 90 tablet 1  .  beclomethasone (QVAR) 80 MCG/ACT inhaler Inhale 1 puff into the lungs 2 (two) times daily.     . Calcium Carbonate-Vitamin D (CALCIUM 600+D) 600-200 MG-UNIT TABS Take 1 tablet by mouth 2 (two) times daily.      . celecoxib (CELEBREX) 200 MG capsule Take 1 capsule (200 mg total) by mouth 2 (two) times daily. 180 capsule 1  . cetirizine (ZYRTEC) 10 MG tablet Take 10 mg by mouth daily.      Marland Kitchen esomeprazole (NEXIUM) 40 MG capsule TAKE 1 CAPSULE BY MOUTH DAILY BEFORE SUPPER. 90 capsule 0  . estradiol (ESTRACE) 0.5 MG tablet Take 1 tablet (0.5 mg total) by mouth daily. 90 tablet 2  . fentaNYL (DURAGESIC - DOSED MCG/HR) 25 MCG/HR Place 1 patch onto the skin every 3 (three) days.      . fluticasone (FLONASE) 50 MCG/ACT nasal spray PLACE 2 SPRAYS INTO THE NOSE DAILY. 48 g 3  . gabapentin (NEURONTIN) 800 MG tablet Take 800 mg by mouth at bedtime.     . methocarbamol (ROBAXIN) 500 MG tablet Take 500 mg by mouth daily with breakfast.    . montelukast (SINGULAIR) 10 MG tablet TAKE 1 TABLET BY MOUTH AT BEDTIME. 90 tablet 0  . Multiple Vitamins-Minerals (MULTIVITAMIN WITH MINERALS) tablet Take 1 tablet by  mouth daily.    Marland Kitchen omega-3 acid ethyl esters (LOVAZA) 1 g capsule TAKE 2 CAPSULES BY MOUTH 2 TIMES DAILY. 360 capsule 1  . PARoxetine (PAXIL) 30 MG tablet Take 30 mg by mouth daily.      No current facility-administered medications on file prior to visit.     Past Surgical History:  Procedure Laterality Date  . BUNIONECTOMY     R 12/08, L 2004 (Dr. Janus Molder)  . CARPAL TUNNEL RELEASE  1989   bilateral  . CATARACT EXTRACTION Left 08/2012   Dr.Hecker  . CESAREAN SECTION     x2  . CHOLECYSTECTOMY  1992  . COLONOSCOPY  2006  . COLONOSCOPY  01/2012   due again 01/2022; mild diverticulosis  . epidural steroid injection, back  02/2010  . HIP SURGERY     right bursectomy x 3  . HIP SURGERY Right 2000   torn cartilage, repaired  . INGUINAL HERNIA REPAIR  9/09   bilat  . NECK SURGERY  1989   c6-7  cervical laminectomy and diskecotmy  . SHOULDER SURGERY  2/05   left rotator cuff repair  . TONSILLECTOMY  age 58  . TOTAL ABDOMINAL HYSTERECTOMY W/ BILATERAL SALPINGOOPHORECTOMY  1997   fibroids  . TROCHANTERIC BURSA EXCISION Right 1978  . TUBAL LIGATION  1986  . WISDOM TOOTH EXTRACTION      Allergies  Allergen Reactions  . Adhesive [Tape] Rash  . Codeine Rash    Social History   Social History  . Marital status: Divorced    Spouse name: N/A  . Number of children: 2  . Years of education: N/A   Occupational History  . [employer]   Social History Main Topics  . Smoking status: Never Smoker  . Smokeless tobacco: Never Used  . Alcohol use 4.2 oz/week    7 Glasses of wine per week     Comment: 1 glass of wine daily.  . Drug use: No  . Sexual activity: Not Currently   Other Topics Concern  . Not on file   Social History Narrative   Divorced, lives alone, [place]; exercises 1 day per week (mowing grass).  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  Adopted a miniature poodle 08/2015 (11yo).   Son lives in [place] (2 grandchildren), daughter lives in [place], [place]    Family History  Problem Relation Age of Onset  . Depression Mother   . Schizophrenia Mother   . Cancer Father        oral  . Heart disease Father        bradycardia  . Cancer Sister        skin and lung  . COPD Sister   . Hypertension Sister   . Hyperthyroidism Brother   . Cancer Brother        metastatic cancer to bone (?primary)  . Hyperlipidemia Daughter   . Asthma Son   . Cancer Paternal Grandmother 60       colon cancer, metastatic to liver  . Colon cancer Paternal Grandmother   . Diabetes Maternal Grandmother     BP (!) 159/74   Pulse (!) 53   Ht 5\' 5"  (1.651 m)   Wt 165 lb (74.8 kg)   BMI 27.46 kg/m   Review of Systems: See HPI above.     Objective:  Physical Exam:  Gen: NAD, comfortable in exam room  Back/right hip: No gross deformity,  scoliosis. TTP posterior to greater trochanter.  No midline  or bony TTP.  No other tenderness. FROM with pain on external rotation passively and actively. Strength LEs 5/5 all muscle groups except 3/5 hip abduction.   2+ MSRs in patellar and achilles tendons, equal bilaterally. Negative SLRs. Sensation intact to light touch bilaterally. Negative logroll bilateral hips Negative fabers and piriformis stretches.  Left hip: FROM without pain.   Assessment & Plan:  1. Right hip pain - 2/2 overuse strain of hip external rotators.  Icing, continue celebrex.  Shown home exercises and stretches to do daily and how to advance these.  Tennis ball massage.  Consider physical therapy, different NSAID.  F/u in 5-6 weeks.

## 2016-08-27 NOTE — Patient Instructions (Signed)
You have overuse strain of your hip external rotators. Ice the area 15 minutes at a time 3-4 times a day. Continue your celebrex. Do home exercises 3 sets of 10 once a day (standing hip side raises, standing hip rotations). When these become easy you can use resistance band for the side raises and eventually do them lying down and with weight. Consider physical therapy - let me know if you want to do this. Tennis ball or lacrosse ball massage to the area. Consider prednisone, different anti-inflammatory. Follow up with me in 5-6 weeks otherwise but call me sooner if you're struggling.

## 2016-09-14 MED FILL — GABAPENTIN 800 MG TABLET: 800 | 90 days supply | Qty: 90 | Fill #1

## 2016-09-14 MED FILL — ESTRADIOL 0.5 MG TABLET: 0.5 | 90 days supply | Qty: 90 | Fill #2

## 2016-09-15 ENCOUNTER — Other Ambulatory Visit: Payer: Self-pay | Admitting: Family Medicine

## 2016-09-15 MED FILL — OMEGA-3 ETHYL ESTER 1 GM CA: 1 | 90 days supply | Qty: 360 | Fill #0

## 2016-09-15 MED FILL — CELECOXIB 200 MG CAPSULE: 200 | 90 days supply | Qty: 180 | Fill #1

## 2016-09-16 MED FILL — METHOCARBAMOL 500 MG TABLET: 500 | 30 days supply | Qty: 90 | Fill #0

## 2016-09-18 ENCOUNTER — Ambulatory Visit (INDEPENDENT_AMBULATORY_CARE_PROVIDER_SITE_OTHER): Payer: Medicare Other | Admitting: Medical

## 2016-09-18 ENCOUNTER — Encounter: Payer: Self-pay | Admitting: Medical

## 2016-09-18 VITALS — BP 116/70 | HR 52 | Temp 98.6°F | Wt 167.4 lb

## 2016-09-18 DIAGNOSIS — J4540 Moderate persistent asthma, uncomplicated: Secondary | ICD-10-CM | POA: Insufficient documentation

## 2016-09-18 DIAGNOSIS — R059 Cough, unspecified: Secondary | ICD-10-CM

## 2016-09-18 DIAGNOSIS — R05 Cough: Secondary | ICD-10-CM | POA: Diagnosis not present

## 2016-09-18 DIAGNOSIS — J0100 Acute maxillary sinusitis, unspecified: Secondary | ICD-10-CM | POA: Diagnosis not present

## 2016-09-18 MED ORDER — PREDNISONE 10 MG PO TABS: ORAL_TABLET | ORAL | 0 refills | Status: DC

## 2016-09-18 MED ORDER — DOXYCYCLINE HYCLATE 100 MG PO TABS: 100.0000 mg | ORAL_TABLET | Freq: Two times a day (BID) | ORAL | 0 refills | Status: DC

## 2016-09-18 MED FILL — DOXYCYCLINE HYC 100 MG TAB: 100 | 10 days supply | Qty: 20 | Fill #0

## 2016-09-18 MED FILL — predniSONE 10 MG TABS: 10 | 6 days supply | Qty: 21 | Fill #0

## 2016-09-18 NOTE — Progress Notes (Signed)
Subjective: Chief Complaint  Patient presents with  . Asthma    using her inhale alot more, coughing has drainages    Here for asthma, cough, and not getting better.  Here for 3 week hx/o drainage down back of throat, and in mornings up until 2 pm in afternoon has coughing fits.   Hears some wheezing.  Works part time, is disruptive when working and trying to talk on the phone.  Has dark yellow flecks of mucous out of chest, productive cough.  Using rescue inhaler more of late.  Takes Qvar and allergy medication year round.  Has had low grade fever.  No NVD, no sick contacts.  Going on a trip soon 1.5 weeks, wants to head this off.  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 No other aggravating or relieving factors. No other complaint.   Past Medical History:  Diagnosis Date  . Allergy   . Amaurosis fugax    negative w/u through [facility]  . Asthma   . Back pain    Dr Joline Salt 02/2010-epidural injection x 2 at L4-5 with good effect  . BCC (basal cell carcinoma of skin)    Dr. Denna Haggard  . Cataract    left  . Chronic pain   . Depression   . Diverticulosis of colon 1998   mild  . DJD (degenerative joint disease)   . Duodenal ulcer    h/o  . GERD (gastroesophageal reflux disease)   . History of SCC (squamous cell carcinoma) of skin    Dr. Denna Haggard  . History of sinus bradycardia   . HLD (hyperlipidemia)    hypertriglyceridemia  . IBS (irritable bowel syndrome)   . Internal hemorrhoids 1998  . Mitral regurgitation    mild  . Ocular migraine 10/31/14   Dr.Hecker  . Osteoarthritis    feet,shoulder,neck,back,hips and hands.  . Vitamin D deficiency    mild   Current Outpatient Prescriptions on File Prior to Visit  Medication Sig Dispense Refill  . acetaminophen (TYLENOL) 500 MG tablet Take 1,000 mg by mouth as needed. Reported on 05/15/2015    . albuterol (PROVENTIL HFA;VENTOLIN HFA) 108 (90 Base) MCG/ACT inhaler Inhale 2 puffs into the lungs every 6 (six)  hours as needed for wheezing or shortness of breath. 18 g 1  . aspirin EC 81 MG tablet Take 81 mg by mouth daily.    Marland Kitchen atorvastatin (LIPITOR) 40 MG tablet TAKE 1 TABLET BY MOUTH DAILY. 90 tablet 1  . beclomethasone (QVAR) 80 MCG/ACT inhaler Inhale 1 puff into the lungs 2 (two) times daily.     . Calcium Carbonate-Vitamin D (CALCIUM 600+D) 600-200 MG-UNIT TABS Take 1 tablet by mouth 2 (two) times daily.      . celecoxib (CELEBREX) 200 MG capsule Take 1 capsule (200 mg total) by mouth 2 (two) times daily. 180 capsule 1  . cetirizine (ZYRTEC) 10 MG tablet Take 10 mg by mouth daily.      Marland Kitchen esomeprazole (NEXIUM) 40 MG capsule TAKE 1 CAPSULE BY MOUTH DAILY BEFORE SUPPER. 90 capsule 0  . estradiol (ESTRACE) 0.5 MG tablet Take 1 tablet (0.5 mg total) by mouth daily. 90 tablet 2  . fentaNYL (DURAGESIC - DOSED MCG/HR) 25 MCG/HR Place 1 patch onto the skin every 3 (three) days.      . fluticasone (FLONASE) 50 MCG/ACT nasal spray PLACE 2 SPRAYS INTO THE NOSE DAILY. 48 g 3  . gabapentin (NEURONTIN) 800 MG tablet Take 800 mg by mouth at  bedtime.     . methocarbamol (ROBAXIN) 500 MG tablet Take 500 mg by mouth daily with breakfast.    . montelukast (SINGULAIR) 10 MG tablet TAKE 1 TABLET BY MOUTH AT BEDTIME. 90 tablet 0  . Multiple Vitamins-Minerals (MULTIVITAMIN WITH MINERALS) tablet Take 1 tablet by mouth daily.    Marland Kitchen omega-3 acid ethyl esters (LOVAZA) 1 g capsule TAKE 2 CAPSULES BY MOUTH 2 TIMES DAILY. 360 capsule 1  . PARoxetine (PAXIL) 30 MG tablet Take 30 mg by mouth daily.      No current facility-administered medications on file prior to visit.     ROS as in subjective   Objective: BP 116/70   Pulse (!) 52   Temp 98.6 F (37 C)   Wt 167 lb 6.4 oz (75.9 kg)   SpO2 97%   BMI 27.86 kg/m   General appearance: Alert, WD/WN, no distress                             Skin: warm, no rash                           Head: +maxillary sinus tenderness,                            Eyes: conjunctiva  normal, corneas clear, PERRLA                            Ears: pearly TMs, external ear canals normal                          Nose: septum midline, turbinates swollen, with erythema and mucoid discharge             Mouth/throat: MMM, tongue normal, mild pharyngeal erythema                           Neck: supple, no adenopathy, no thyromegaly, non tender                          Heart: RRR, normal S1, S2, no murmurs                         Lungs: coarse sounds, but no wheezes, rales, or rhonchi         Assessment: Encounter Diagnoses  Name Primary?  . Acute non-recurrent maxillary sinusitis Yes  . Cough   . Moderate persistent asthma, unspecified whether complicated      Plan: Discussed her symptoms and concerns, discussed supportive care, rest, hydration, and begin medications below.  C/t daily Qvar and daily allergy medication regimen.  If not much improved within a week, call or return.  Balinda was seen today for asthma.  Diagnoses and all orders for this visit:  Acute non-recurrent maxillary sinusitis  Cough  Moderate persistent asthma, unspecified whether complicated  Other orders -     predniSONE (DELTASONE) 10 MG tablet; 6/5/4/3/2/1 taper -     doxycycline (VIBRA-TABS) 100 MG tablet; Take 1 tablet (100 mg total) by mouth 2 (two) times daily.

## 2016-09-21 NOTE — Progress Notes (Signed)
Cardiology Office Note NEW PATIENT VISIT   Date:  09/23/2016   ID:  Beverly [name], DOB Jul 02, 1945, MRN [id number]  PCP:  Rita Ohara, MD  Cardiologist:  NEW Dr. Johnsie Cancel    No chief complaint on file.     History of Present Illness: Beverly [name] is a 71 y.o. female who presents for f/u of chest/upper abd pressure on 06/09/16.  Seen by PA 06/29/16  She woke without problem and ate BK then developed upper abd pressure.  No SOB but + nausea and diaphoresis.  It stayed with her all day and she went to sleep with it.  Her appetite was decreased as well.  She woke the next day without problems.   She is on lipitor hor HLD, last LDL was 68.  +  cardiac hx in father.  She has mild sleep apnea and uses oral device and is resting better.  She has a hx of asthma but only rare episode.   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    Her HR is slow to 46 at times and when she stands on occ. She feels lightheaded.  In 2016 she had amaurosis fugax and echo was normal, carotids normal.  She did wear a monitor and had a couple runs of SVT and HR to 41.      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 Allergy   . Amaurosis fugax    negative w/u through [facility]  . Asthma   . Back pain    Dr Joline Salt 02/2010-epidural injection x 2 at L4-5 with good effect  . BCC (basal cell carcinoma of skin)    Dr. Denna Haggard  . Cataract    left  . Chronic pain   . Depression   . Diverticulosis of colon 1998   mild  . DJD (degenerative joint disease)   . Duodenal ulcer    h/o  . GERD (gastroesophageal reflux disease)   . History of SCC (squamous cell carcinoma) of skin    Dr. Denna Haggard  . History of sinus bradycardia   . HLD (hyperlipidemia)    hypertriglyceridemia  .  IBS (irritable bowel syndrome)   . Internal hemorrhoids 1998  . Mitral regurgitation    mild  . Ocular migraine 10/31/14   Dr.Hecker  . Osteoarthritis    feet,shoulder,neck,back,hips and hands.  . Vitamin D deficiency    mild    Past Surgical History:  Procedure Laterality Date  . BUNIONECTOMY     R 12/08, L 2004 (Dr. Janus Molder)  . CARPAL TUNNEL RELEASE  1989   bilateral  . CATARACT EXTRACTION Left 08/2012   Dr.Hecker  . CESAREAN SECTION     x2  . CHOLECYSTECTOMY  1992  . COLONOSCOPY  2006  . COLONOSCOPY  01/2012   due again 01/2022; mild diverticulosis  . epidural steroid injection, back  02/2010  . HIP SURGERY     right bursectomy x 3  . HIP SURGERY Right 2000   torn cartilage, repaired  . INGUINAL HERNIA REPAIR  9/09   bilat  . NECK SURGERY  1989   c6-7 cervical laminectomy and diskecotmy  . SHOULDER SURGERY  2/05   left rotator cuff repair  .  TONSILLECTOMY  age 36  . TOTAL ABDOMINAL HYSTERECTOMY W/ BILATERAL SALPINGOOPHORECTOMY  1997   fibroids  . TROCHANTERIC BURSA EXCISION Right 1978  . TUBAL LIGATION  1986  . WISDOM TOOTH EXTRACTION       Current Outpatient Prescriptions  Medication Sig Dispense Refill  . acetaminophen (TYLENOL) 500 MG tablet Take 1,000 mg by mouth as needed. Reported on 05/15/2015    . albuterol (PROVENTIL HFA;VENTOLIN HFA) 108 (90 Base) MCG/ACT inhaler Inhale 2 puffs into the lungs every 6 (six) hours as needed for wheezing or shortness of breath. 18 g 1  . aspirin EC 81 MG tablet Take 81 mg by mouth daily.    Marland Kitchen atorvastatin (LIPITOR) 40 MG tablet TAKE 1 TABLET BY MOUTH DAILY. 90 tablet 1  . beclomethasone (QVAR) 80 MCG/ACT inhaler Inhale 1 puff into the lungs 2 (two) times daily.     . Calcium Carbonate-Vitamin D (CALCIUM 600+D) 600-200 MG-UNIT TABS Take 1 tablet by mouth 2 (two) times daily.      . celecoxib (CELEBREX) 200 MG capsule Take 1 capsule (200 mg total) by mouth 2 (two) times daily. 180 capsule 1  . cetirizine (ZYRTEC) 10 MG  tablet Take 10 mg by mouth daily.      Marland Kitchen doxycycline (VIBRA-TABS) 100 MG tablet Take 1 tablet (100 mg total) by mouth 2 (two) times daily. 20 tablet 0  . esomeprazole (NEXIUM) 40 MG capsule TAKE 1 CAPSULE BY MOUTH DAILY BEFORE SUPPER. 90 capsule 0  . estradiol (ESTRACE) 0.5 MG tablet Take 1 tablet (0.5 mg total) by mouth daily. 90 tablet 2  . fentaNYL (DURAGESIC - DOSED MCG/HR) 25 MCG/HR Place 1 patch onto the skin every 3 (three) days.      . fluticasone (FLONASE) 50 MCG/ACT nasal spray PLACE 2 SPRAYS INTO THE NOSE DAILY. 48 g 3  . gabapentin (NEURONTIN) 800 MG tablet Take 800 mg by mouth at bedtime.     . methocarbamol (ROBAXIN) 500 MG tablet Take 500 mg by mouth daily with breakfast.    . montelukast (SINGULAIR) 10 MG tablet TAKE 1 TABLET BY MOUTH AT BEDTIME. 90 tablet 0  . Multiple Vitamins-Minerals (MULTIVITAMIN WITH MINERALS) tablet Take 1 tablet by mouth daily.    Marland Kitchen omega-3 acid ethyl esters (LOVAZA) 1 g capsule TAKE 2 CAPSULES BY MOUTH 2 TIMES DAILY. 360 capsule 1  . PARoxetine (PAXIL) 30 MG tablet Take 30 mg by mouth daily.     . predniSONE (DELTASONE) 10 MG tablet 6/5/4/3/2/1 taper 21 tablet 0   No current facility-administered medications for this visit.     Allergies:   Adhesive [tape] and Codeine    Social History:  The patient  reports that she has never smoked. She has never used smokeless tobacco. She reports that she drinks about 4.2 oz of alcohol per week . She reports that she does not use drugs.   Family History:  The patient's family history includes Asthma in her son; COPD in her sister; Cancer in her brother, father, and sister; Cancer (age of onset: 28) in her paternal grandmother; Colon cancer in her paternal grandmother; Depression in her mother; Diabetes in her maternal grandmother; Heart disease in her father; Hyperlipidemia in her daughter; Hypertension in her sister; Hyperthyroidism in her brother; Schizophrenia in her mother.    ROS:  General:no colds or  fevers, slow increase of weight  Skin:no rashes or ulcers HEENT:no blurred vision, no congestion CV:see HPI PUL:see HPI GI:no diarrhea constipation or melena, + hx of indigestion takes  nexium and usually no break through.   GU:no hematuria, no dysuria MS:no joint pain, no claudication Neuro:no syncope, occ lightheadedness Endo:no diabetes, no thyroid disease  Wt Readings from Last 3 Encounters:  09/23/16 168 lb 4 oz (76.3 kg)  09/18/16 167 lb 6.4 oz (75.9 kg)  08/27/16 165 lb (74.8 kg)     PHYSICAL EXAM: VS:  BP 118/64   Pulse 62   Ht 5\' 5"  (1.651 m)   Wt 168 lb 4 oz (76.3 kg)   SpO2 97%   BMI 28.00 kg/m  , BMI Body mass index is 28 kg/m. Affect appropriate Healthy:  appears stated age 30: normal Neck supple with no adenopathy JVP normal no bruits no thyromegaly Lungs clear with no wheezing and good diaphragmatic motion Heart:  S1/S2 no murmur, no rub, gallop or click PMI normal Abdomen: benighn, BS positve, no tenderness, no AAA no bruit.  No HSM or HJR Distal pulses intact with no bruits No edema Neuro non-focal Skin warm and dry No muscular weakness     EKG:  06/29/16  SBrady with 1st degree AV block incomplete  RBBB non specfic ST and T wave abnormality    Recent Labs: 06/11/2016: ALT 23; BUN 18; Creat 0.71; Hemoglobin 14.0; Platelets 165; Potassium 4.2; Sodium 141; TSH 1.32    Lipid Panel    Component Value Date/Time   CHOL 151 06/11/2016 1124   TRIG 226 (H) 06/11/2016 1124   HDL 38 (L) 06/11/2016 1124   CHOLHDL 4.0 06/11/2016 1124   VLDL 45 (H) 06/11/2016 1124   LDLCALC 68 06/11/2016 1124       Other studies Reviewed: Additional studies/ records that were reviewed today include: office visit notes and echo from 2016.  Echo 2016 Study Conclusions  - Left ventricle: The cavity size was normal. Systolic function was   normal. The estimated ejection fraction was in the range of 55%   to 60%. Wall motion was normal; there were no regional  wall   motion abnormalities. - Mitral valve: There was mild regurgitation. - Pericardium, extracardiac: A trivial pericardial effusion was   identified.  Impressions:  - No cardiac source of emboli was indentified  ASSESSMENT AND PLAN:  1.  Chest pain resolved normal myovue 06/29/16 observe   2. SB with 1 st degree AV block and incomplete RBBB.  Briefly discussed possibility of PPM in future but currently asymptomatic. Yearly ECG   3.  HLD on mediation and last LDL68 06/11/16 with normal LFTls   4. GERD on nexium weight loss and low carb diet   5. Hx of asthma no active wheezing PRN inhaler    [name], MD

## 2016-09-23 ENCOUNTER — Ambulatory Visit (INDEPENDENT_AMBULATORY_CARE_PROVIDER_SITE_OTHER): Payer: Medicare Other | Admitting: Cardiovascular Disease

## 2016-09-23 ENCOUNTER — Encounter: Payer: Self-pay | Admitting: Cardiovascular Disease

## 2016-09-23 VITALS — BP 118/64 | HR 62 | Ht 65.0 in | Wt 168.2 lb

## 2016-09-23 DIAGNOSIS — R079 Chest pain, unspecified: Secondary | ICD-10-CM

## 2016-09-23 NOTE — Patient Instructions (Signed)
Medication Instructions:  Your physician recommends that you continue on your current medications as directed. Please refer to the Current Medication list given to you today.   Labwork: None ordered  Testing/Procedures: None ordered  Follow-Up: Your physician wants you to follow-up AS NEEDED with Dr. Johnsie Cancel.    Any Other Special Instructions Will Be Listed Below (If Applicable).     If you need a refill on your cardiac medications before your next appointment, please call your pharmacy.

## 2016-09-26 MED FILL — FLUTICASONE PROP 50 MCG SPR: 50 | 90 days supply | Qty: 48 | Fill #1

## 2016-10-12 ENCOUNTER — Telehealth: Payer: Self-pay | Admitting: Medical

## 2016-10-12 ENCOUNTER — Other Ambulatory Visit: Payer: Self-pay | Admitting: Medical

## 2016-10-12 ENCOUNTER — Ambulatory Visit
Admission: RE | Admit: 2016-10-12 | Discharge: 2016-10-12 | Disposition: A | Discharge status: Home / Self Care | Payer: Medicare Other | Source: Ambulatory Visit | Attending: Medical | Admitting: Medical

## 2016-10-12 DIAGNOSIS — R05 Cough: Secondary | ICD-10-CM

## 2016-10-12 DIAGNOSIS — R059 Cough, unspecified: Secondary | ICD-10-CM

## 2016-10-12 DIAGNOSIS — R062 Wheezing: Secondary | ICD-10-CM

## 2016-10-12 NOTE — Telephone Encounter (Signed)
Pt sent a my chart message and was called back. She states that she saw Audelia Acton for a cough. She states that although she doesn't has as much drainage her cough is not any better. Pt uses [facility] and can be reached at [phone].

## 2016-10-12 NOTE — Telephone Encounter (Signed)
Called pt she is going to have x-ray done.

## 2016-10-12 NOTE — Progress Notes (Signed)
Dg ch 

## 2016-10-12 NOTE — Telephone Encounter (Signed)
I last saw her 09/18/16 (almost a month ago)   What all symptoms does she have now, fever? Productive cough? Wheezing?   She completed a course of Doxycyline and prednisone from 09/18/16.   Depending upon symptoms she made need to come back in or get chest xray.   Let me know and I'll respond.

## 2016-10-12 NOTE — Telephone Encounter (Signed)
Forwarding to shane 

## 2016-10-12 NOTE — Telephone Encounter (Signed)
Called and spoke with pt she is still have productive cough, wheezing hasn't gotten any better. Do you want her to have x-ray ?

## 2016-10-12 NOTE — Telephone Encounter (Signed)
Yes, have her go for chest xray and f/u appt here.  She normally sees Dr. Tomi Bamberger

## 2016-10-13 ENCOUNTER — Other Ambulatory Visit: Payer: Self-pay | Admitting: Medical

## 2016-10-13 ENCOUNTER — Telehealth: Payer: Self-pay

## 2016-10-13 ENCOUNTER — Encounter: Payer: Self-pay | Admitting: Medical

## 2016-10-13 MED ORDER — GUAIFENESIN ER 600 MG PO TB12: 600.0000 mg | ORAL_TABLET | Freq: Two times a day (BID) | ORAL | 0 refills | Status: DC

## 2016-10-13 MED ORDER — LEVOFLOXACIN 500 MG PO TABS: 500.0000 mg | ORAL_TABLET | Freq: Every day | ORAL | 0 refills | Status: DC

## 2016-10-13 MED ORDER — LEVOFLOXACIN 500 MG PO TABS: 500.0000 mg | ORAL_TABLET | Freq: Every day | ORAL | 0 refills | Status: AC

## 2016-10-13 MED FILL — levoFLOXacin 500 MG TABS: 500 | 7 days supply | Qty: 7 | Fill #0

## 2016-10-13 MED FILL — HYDROCODON-APAP 7.5-325: 7.5-325 | 30 days supply | Qty: 100 | Fill #0

## 2016-10-13 NOTE — Telephone Encounter (Signed)
Pt called back and wanted to know if she can have a rx for mucinex 800mg   Sent in to [facility]

## 2016-10-13 NOTE — Progress Notes (Signed)
Med request

## 2016-10-16 ENCOUNTER — Ambulatory Visit (INDEPENDENT_AMBULATORY_CARE_PROVIDER_SITE_OTHER): Payer: Medicare Other | Admitting: Medical

## 2016-10-16 VITALS — BP 130/80 | HR 54 | Temp 98.6°F | Wt 167.6 lb

## 2016-10-16 DIAGNOSIS — J4521 Mild intermittent asthma with (acute) exacerbation: Secondary | ICD-10-CM

## 2016-10-16 DIAGNOSIS — R05 Cough: Secondary | ICD-10-CM | POA: Diagnosis not present

## 2016-10-16 DIAGNOSIS — R0989 Other specified symptoms and signs involving the circulatory and respiratory systems: Secondary | ICD-10-CM | POA: Diagnosis not present

## 2016-10-16 DIAGNOSIS — R059 Cough, unspecified: Secondary | ICD-10-CM

## 2016-10-16 MED ORDER — BUDESONIDE-FORMOTEROL FUMARATE 160-4.5 MCG/ACT IN AERO: 2.0000 | INHALATION_SPRAY | Freq: Two times a day (BID) | RESPIRATORY_TRACT | 0 refills | Status: DC

## 2016-10-16 NOTE — Progress Notes (Signed)
Subjective: Chief Complaint  Patient presents with  . Pneumonia    pneumonia disuss x-ray    Here for f/u.  I saw her about a month ago for asthma flare and sinusitis.   We treated her then with round of doxycycline and prednisone, and she has continued her normal allergy and asthma preventative medications.  Had some improvement while on vacation, but the congestion never fully cleared.  However she called in earlier this week about having had improvement, but now having more cough and getting sick again.   We sent for chest xray and called out medication for possible early pneumonia ( Mucinex and Levaquin).   Today she reports some cough, still some nasal congestion.  sputums is not rust colored like it was last week.  Is considerably better, but still has a lot of cough.  Has had low grade fever.  Having some sweats.   Gets fatigued some of late.  No recent GERD flare.   Still taking her usual allergy and asthma medications.  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  Gets Quantiferon test every fall.  Last year's was negative.  She has the repeat Quantiferon soon. No other aggravating or relieving factors. No other complaint.  Past Medical History:  Diagnosis Date  . Allergy   . Amaurosis fugax    negative w/u through [facility]  . Asthma   . Back pain    Dr Joline Salt 02/2010-epidural injection x 2 at L4-5 with good effect  . BCC (basal cell carcinoma of skin)    Dr. Denna Haggard  . Cataract    left  . Chronic pain   . Depression   . Diverticulosis of colon 1998   mild  . DJD (degenerative joint disease)   . Duodenal ulcer    h/o  . GERD (gastroesophageal reflux disease)   . History of SCC (squamous cell carcinoma) of skin    Dr. Denna Haggard  . History of sinus bradycardia   . HLD (hyperlipidemia)    hypertriglyceridemia  . IBS (irritable bowel syndrome)   . Internal hemorrhoids 1998  . Mitral regurgitation    mild  . Ocular  migraine 10/31/14   Dr.Hecker  . Osteoarthritis    feet,shoulder,neck,back,hips and hands.  . Vitamin D deficiency    mild   Current Outpatient Prescriptions on File Prior to Visit  Medication Sig Dispense Refill  . acetaminophen (TYLENOL) 500 MG tablet Take 1,000 mg by mouth as needed. Reported on 05/15/2015    . albuterol (PROVENTIL HFA;VENTOLIN HFA) 108 (90 Base) MCG/ACT inhaler Inhale 2 puffs into the lungs every 6 (six) hours as needed for wheezing or shortness of breath. 18 g 1  . aspirin EC 81 MG tablet Take 81 mg by mouth daily.    Marland Kitchen atorvastatin (LIPITOR) 40 MG tablet TAKE 1 TABLET BY MOUTH DAILY. 90 tablet 1  . beclomethasone (QVAR) 80 MCG/ACT inhaler Inhale 1 puff into the lungs 2 (two) times daily.     . Calcium Carbonate-Vitamin D (CALCIUM 600+D) 600-200 MG-UNIT TABS Take 1 tablet by mouth 2 (two) times daily.      . celecoxib (CELEBREX) 200 MG capsule Take 1 capsule (200 mg total) by mouth 2 (two) times daily. 180 capsule 1  . cetirizine (ZYRTEC) 10 MG tablet Take 10 mg by mouth daily.      Marland Kitchen esomeprazole (NEXIUM) 40 MG capsule TAKE 1 CAPSULE BY MOUTH DAILY BEFORE SUPPER. 90 capsule  0  . estradiol (ESTRACE) 0.5 MG tablet Take 1 tablet (0.5 mg total) by mouth daily. 90 tablet 2  . fentaNYL (DURAGESIC - DOSED MCG/HR) 25 MCG/HR Place 1 patch onto the skin every 3 (three) days.      . fluticasone (FLONASE) 50 MCG/ACT nasal spray PLACE 2 SPRAYS INTO THE NOSE DAILY. 48 g 3  . gabapentin (NEURONTIN) 800 MG tablet Take 800 mg by mouth at bedtime.     Marland Kitchen levofloxacin (LEVAQUIN) 500 MG tablet Take 1 tablet (500 mg total) by mouth daily. 7 tablet 0  . methocarbamol (ROBAXIN) 500 MG tablet Take 500 mg by mouth daily with breakfast.    . montelukast (SINGULAIR) 10 MG tablet TAKE 1 TABLET BY MOUTH AT BEDTIME. 90 tablet 0  . Multiple Vitamins-Minerals (MULTIVITAMIN WITH MINERALS) tablet Take 1 tablet by mouth daily.    Marland Kitchen omega-3 acid ethyl esters (LOVAZA) 1 g capsule TAKE 2 CAPSULES BY MOUTH 2  TIMES DAILY. 360 capsule 1  . PARoxetine (PAXIL) 30 MG tablet Take 30 mg by mouth daily.      No current facility-administered medications on file prior to visit.    ROS as in subjective   Objective: BP 130/80   Pulse (!) 54   Temp 98.6 F (37 C)   Wt 167 lb 9.6 oz (76 kg)   SpO2 97%   BMI 27.89 kg/m   General appearance: alert, no distress, WD/WN,  HEENT: normocephalic, sclerae anicteric, TMs pearly, nares patent, no discharge or erythema, pharynx with mucoid drainage Oral cavity: MMM, no lesions Neck: supple, no lymphadenopathy, no thyromegaly, no masses Heart: RRR, normal S1, S2, no murmurs Lungs: somewhat coarse breath sounds throughout, no wheezes, no dullness Ext: no edema Pulses: 2+ symmetric, upper and lower extremities, normal cap refill     Assessment: Encounter Diagnoses  Name Primary?  . Cough Yes  . Mild intermittent asthmatic bronchitis with acute exacerbation   . Abnormal lung sounds      Plan: Finish Levaquin, rest, hydrate well, can use mucinex rest of the week.  temporarily we will change to Symbicort instead of Qvar.   C/t albuterol inhaler prn.    When she runs out of Symbicort in a few weeks, then switch back to Qvar.   If not much improved in the next week, then call back.  C/t plan for Quantiferon testing next month through employer.  Leiann was seen today for pneumonia.  Diagnoses and all orders for this visit:  Cough  Mild intermittent asthmatic bronchitis with acute exacerbation  Abnormal lung sounds  Other orders -     budesonide-formoterol (SYMBICORT) 160-4.5 MCG/ACT inhaler; Inhale 2 puffs into the lungs 2 (two) times daily.

## 2016-10-20 ENCOUNTER — Encounter: Payer: Self-pay | Admitting: Medical

## 2016-10-30 ENCOUNTER — Other Ambulatory Visit: Payer: Self-pay | Admitting: Medical

## 2016-10-30 ENCOUNTER — Encounter: Payer: Self-pay | Admitting: Family Medicine

## 2016-11-02 ENCOUNTER — Telehealth: Payer: Self-pay | Admitting: *Deleted

## 2016-11-02 ENCOUNTER — Other Ambulatory Visit: Payer: Self-pay | Admitting: *Deleted

## 2016-11-02 MED ORDER — BUDESONIDE-FORMOTEROL FUMARATE 160-4.5 MCG/ACT IN AERO: 2.0000 | INHALATION_SPRAY | Freq: Two times a day (BID) | RESPIRATORY_TRACT | 0 refills | Status: DC

## 2016-11-02 MED FILL — SYMBICORT 160-4.5 MCG INH: 160-4.5 | 30 days supply | Qty: 10 | Fill #0

## 2016-11-02 NOTE — Telephone Encounter (Signed)
Ok to refill the symbicort.  She should schedule a f/u when she returns back to town if still needing to use the rescue inhaler regularly

## 2016-11-02 NOTE — Telephone Encounter (Signed)
I called patient bc a refill came in on her symbicort-she sent Dr. Karlton Lemon a message by mistake this ia what it said:  I am needing to use my rescue inhaler a couple of times a day. I have not used up the sample Symbicort yet. I'm thinking that I don't want to change back to just the Qvar at this time. Would you consider refilling the Symbicort?   Thank you. I could pick it up this afternoon.   Beverly [name]   Going out of tomorrow as her sister has been diagnosed with terminal emphysema and also asking to come in and [name] as [facility] is no longer doing this as you and she had discussed.

## 2016-11-02 NOTE — Telephone Encounter (Signed)
rx sent and patient Beverly [name] will schedule appt when she returns and will do Quantiferon at that if still needed.

## 2016-11-15 NOTE — Progress Notes (Signed)
Chief Complaint  Patient presents with  . Follow-up    was switched from Qvar to Symbicort-following up today.   . Medication Refill    needs refills on lipitor, symbicort, nexium and singulair today.     She saw Audelia Acton on 8/3. At that time she had complaint of flare of asthma, cough, and not getting better. She had 3 weeks of postnasal drainage, and in mornings up until 2 pm in afternoon has coughing fits.   Hears some wheezing.  Cough was disruptive when working and trying to talk on the phone.  Had dark yellow flecks of mucous out of chest, productive cough, low grade fever. She was needing to use rescue inhaler more often. Compliant with Qvar and allergy medication, uses year round.  She was treated with prednisone and doxycycline, to continue her other meds, including Qvar.  She called 8/27 stating she had persistent cough.  She was sent for CXR, which showed: FINDINGS: The lungs are adequately inflated. The lung markings are coarse in the right lower lobe and more conspicuous than in the past. The left lung is clear. The heart and pulmonary vascularity are normal. The trachea is midline. The bony thorax exhibits no acute abnormality.  IMPRESSION: Right lower lobe atelectasis or early pneumonia. Followup PA and lateral chest X-ray is recommended in 3-4 weeks following trial of antibiotic therapy to ensure resolution and exclude underlying Malignancy.  She was put on levaquin and Mucinex.  She returned 8/31 for f/u --rust-colored sputum was improving, but she was still congested and still coughing.  Audelia Acton then changed her Qvar to a Symbicort sample, 160-4.5 MCG/ACT inhaler given.  She found that very helpful.  She contacted [place] on 9/14, for a refill, stating that she was still using her rescue inhaler twice a day, not feeling ready to go back to Qvar.  She was given a refill, and presents today for f/u.  Last spirometry was done in April, and was normal.  She is still using Symbicort 2  puffs BID.  She is no longer needing to use albuterol.  She still has some nasal drainage, with intermittent deep cough, productive of gold-colored sputum about 2x/d.  She took Sudafed today.  She continues to clear her throat frequently.  What she coughs up is thicker than what she gets up when she clears her throat. She has not been using the mucinex recently.  Had flu shot and quantiferon gold done today through [facility].   PMH, PSH, SH reviewed  Outpatient Encounter Prescriptions as of 11/16/2016  Medication Sig  . acetaminophen (TYLENOL) 500 MG tablet Take 1,000 mg by mouth as needed. Reported on 05/15/2015  . aspirin EC 81 MG tablet Take 81 mg by mouth daily.  Marland Kitchen atorvastatin (LIPITOR) 40 MG tablet TAKE 1 TABLET BY MOUTH DAILY.  . budesonide-formoterol (SYMBICORT) 160-4.5 MCG/ACT inhaler Inhale 2 puffs into the lungs 2 (two) times daily.  . Calcium Carbonate-Vitamin D (CALCIUM 600+D) 600-200 MG-UNIT TABS Take 1 tablet by mouth 2 (two) times daily.    . celecoxib (CELEBREX) 200 MG capsule Take 1 capsule (200 mg total) by mouth 2 (two) times daily.  . cetirizine (ZYRTEC) 10 MG tablet Take 10 mg by mouth daily.    Marland Kitchen esomeprazole (NEXIUM) 40 MG capsule TAKE 1 CAPSULE BY MOUTH DAILY BEFORE SUPPER.  Marland Kitchen estradiol (ESTRACE) 0.5 MG tablet Take 1 tablet (0.5 mg total) by mouth daily.  . fentaNYL (DURAGESIC - DOSED MCG/HR) 25 MCG/HR Place 1 patch onto the skin every  3 (three) days.    . fluticasone (FLONASE) 50 MCG/ACT nasal spray PLACE 2 SPRAYS INTO THE NOSE DAILY.  Marland Kitchen gabapentin (NEURONTIN) 800 MG tablet Take 800 mg by mouth at bedtime.   . methocarbamol (ROBAXIN) 500 MG tablet Take 500 mg by mouth daily with breakfast.  . montelukast (SINGULAIR) 10 MG tablet TAKE 1 TABLET BY MOUTH AT BEDTIME.  . Multiple Vitamins-Minerals (MULTIVITAMIN WITH MINERALS) tablet Take 1 tablet by mouth daily.  Marland Kitchen omega-3 acid ethyl esters (LOVAZA) 1 g capsule TAKE 2 CAPSULES BY MOUTH 2 TIMES DAILY.  Marland Kitchen PARoxetine (PAXIL) 30 MG  tablet Take 30 mg by mouth daily.   Marland Kitchen albuterol (PROVENTIL HFA;VENTOLIN HFA) 108 (90 Base) MCG/ACT inhaler Inhale 2 puffs into the lungs every 6 (six) hours as needed for wheezing or shortness of breath. (Patient not taking: Reported on 11/16/2016)  . beclomethasone (QVAR) 80 MCG/ACT inhaler Inhale 1 puff into the lungs 2 (two) times daily.    No facility-administered encounter medications on file as of 11/16/2016.    Allergies  Allergen Reactions  . Adhesive [Tape] Rash  . Codeine Rash    ROS: no fever, chills, headaches, dizziness.  Sweats/menopausal symptoms unchanged.  No nausea, vomiting, diarrhea, abdominal pain, urinary symptoms. No chest pain, shortness of breath, rash.   PHYSICAL EXAM:  BP 128/64 (BP Location: Left Arm, Patient Position: Sitting, Cuff Size: Normal)   Pulse 60   Ht _0  (1.626 m)   Wt 164 lb 6.4 oz (74.6 kg)   BMI 28.22 kg/m   Well appearing, pleasant female, in good spirits, in no distress. No coughing.  Occasional throat-clearing HEENT: PERRL, conjunctiva and sclera are clear.  Left eye is hazel, right is blue Nasal mucosa appears normal, no erythema or purulence. OP--notable for some thick white mucus posteriorly on the right. No erythema, lesions or other abnormality. Mildly tender over the left maxillary sinus Neck: no lymphadenopathy or mass Heart: regular rate and rhythm, no murmur Lungs: clear bilaterally.  Good air movement. No rales, ronchi or wheezes Extremities: no edema Skin: normal turgor, no rash (healing lesion left cheek, recently treated). Psych: normal mood, affect, hygiene and grooming Neuro: alert and oriented, cranial nerves intact, normal gait   ASSESSMENT/PLAN:  Pneumonia of right lower lobe due to infectious organism ([place]) - clinically resolved; some e/o allergies, cannot r/o early sinus infection--supportive measures.  F/u CXR due - Plan: DG Chest 2 View  Moderate persistent asthma, unspecified whether complicated - flared  with PNA. no longer needing rescue inhaler; complete current Symbicort, then back to Qvar (can refill if doesn't do well on Qvar)  Medication monitoring encounter - chronic NSAID use, due for c-met while here - Plan: Comprehensive metabolic panel   c-met today for med monitor (celebrex)   Drink plenty of water. Restart guaifenesin  (ie mucinex) to help keep the mucus thin. I recommend trying sinus rinses (sinus rinse kit or Neti-pot) once or twice daily. You may continue the sudafed as needed for sinus pain.  Go to [facility] at your convenience for a follow-up chest x-ray.  Your lungs sound clear. If you have increasing sinus pain and purulence drainage, we may need to treat for a sinus infection.  Hopefully, this can be prevented by the above measures.  Finish out the Symbicort you have.  It sounds as though you are doing better, no longer needing albuterol.  Switch back to the Qvar once you finish the Symbicort.  If you start needing the albuterol again, let  us know and we can continue you on Symbicort instead (perhaps ultimately be able to taper down the dose).

## 2016-11-16 ENCOUNTER — Ambulatory Visit
Admission: RE | Admit: 2016-11-16 | Discharge: 2016-11-16 | Disposition: A | Discharge status: Home / Self Care | Payer: Medicare Other | Source: Ambulatory Visit | Attending: Family Medicine | Admitting: Family Medicine

## 2016-11-16 ENCOUNTER — Ambulatory Visit (INDEPENDENT_AMBULATORY_CARE_PROVIDER_SITE_OTHER): Payer: Medicare Other | Admitting: Family Medicine

## 2016-11-16 ENCOUNTER — Encounter: Payer: Self-pay | Admitting: Family Medicine

## 2016-11-16 VITALS — BP 128/64 | HR 60 | Ht 64.0 in | Wt 164.4 lb

## 2016-11-16 DIAGNOSIS — J4540 Moderate persistent asthma, uncomplicated: Secondary | ICD-10-CM | POA: Diagnosis not present

## 2016-11-16 DIAGNOSIS — J181 Lobar pneumonia, unspecified organism: Secondary | ICD-10-CM | POA: Diagnosis not present

## 2016-11-16 DIAGNOSIS — J189 Pneumonia, unspecified organism: Secondary | ICD-10-CM

## 2016-11-16 DIAGNOSIS — Z5181 Encounter for therapeutic drug level monitoring: Secondary | ICD-10-CM | POA: Diagnosis not present

## 2016-11-16 LAB — COMPREHENSIVE METABOLIC PANEL
AG RATIO: 2.6 (calc) — AB (ref 1.0–2.5)
ALT: 17 U/L (ref 6–29)
AST: 17 U/L (ref 10–35)
Albumin: 4.6 g/dL (ref 3.6–5.1)
Alkaline phosphatase (APISO): 77 U/L (ref 33–130)
BILIRUBIN TOTAL: 0.6 mg/dL (ref 0.2–1.2)
BUN: 15 mg/dL (ref 7–25)
CALCIUM: 9.4 mg/dL (ref 8.6–10.4)
CHLORIDE: 105 mmol/L (ref 98–110)
CO2: 29 mmol/L (ref 20–32)
Creat: 0.64 mg/dL (ref 0.50–0.99)
GLOBULIN: 1.8 g/dL — AB (ref 1.9–3.7)
GLUCOSE: 98 mg/dL (ref 65–99)
Potassium: 4.5 mmol/L (ref 3.5–5.3)
SODIUM: 139 mmol/L (ref 135–146)
TOTAL PROTEIN: 6.4 g/dL (ref 6.1–8.1)

## 2016-11-16 NOTE — Patient Instructions (Signed)
  Drink plenty of water. Restart guaifenesin  (ie mucinex) to help keep the mucus thin. I recommend trying sinus rinses (sinus rinse kit or Neti-pot) once or twice daily. You may continue the sudafed as needed for sinus pain.  Go to [facility] at your convenience for a follow-up chest x-ray.  Your lungs sound clear. If you have increasing sinus pain and purulence drainage, we may need to treat for a sinus infection.  Hopefully, this can be prevented by the above measures.  Finish out the Symbicort you have.  It sounds as though you are doing better, no longer needing albuterol.  Switch back to the Qvar once you finish the Symbicort.  If you start needing the albuterol again, let us know and we can continue you on Symbicort instead (perhaps ultimately be able to taper down the dose).

## 2016-11-19 ENCOUNTER — Other Ambulatory Visit: Payer: Self-pay | Admitting: Family Medicine

## 2016-11-19 MED FILL — ATORVASTATIN 40 MG TABLET: 40 | 90 days supply | Qty: 90 | Fill #1

## 2016-11-19 MED FILL — ESOMEPRAZOLE MAG DR 40 MG C: 40 | 90 days supply | Qty: 90 | Fill #0

## 2016-11-19 MED FILL — MONTELUKAST SOD 10 MG TAB: 10 | 90 days supply | Qty: 90 | Fill #0

## 2016-11-23 MED FILL — fentaNYL 25 MCG/HR PT72: 25 | 90 days supply | Qty: 30 | Fill #0

## 2016-12-05 LAB — HM MAMMOGRAPHY

## 2016-12-06 ENCOUNTER — Encounter: Payer: Self-pay | Admitting: Family Medicine

## 2016-12-07 MED ORDER — AMOXICILLIN-POT CLAVULANATE 875-125 MG PO TABS: 1.0000 | ORAL_TABLET | Freq: Two times a day (BID) | ORAL | 0 refills | Status: DC

## 2016-12-07 MED FILL — AMOX TR-K CLV 875-125 MG TA: 875-125 | 10 days supply | Qty: 20 | Fill #0

## 2016-12-17 ENCOUNTER — Encounter: Payer: Self-pay | Admitting: *Deleted

## 2016-12-28 ENCOUNTER — Other Ambulatory Visit: Payer: Self-pay | Admitting: Family Medicine

## 2016-12-28 DIAGNOSIS — N959 Unspecified menopausal and perimenopausal disorder: Secondary | ICD-10-CM

## 2016-12-28 DIAGNOSIS — M159 Polyosteoarthritis, unspecified: Secondary | ICD-10-CM

## 2016-12-28 MED FILL — CELECOXIB 200 MG CAPS: 200 | 90 days supply | Qty: 180 | Fill #0

## 2016-12-28 MED FILL — OMEGA-3 ETHYL ESTERS 1 GM C: 1 | 90 days supply | Qty: 360 | Fill #1

## 2016-12-28 MED FILL — ESTRADIOL 0.5 MG TABLET: 0.5 | 90 days supply | Qty: 90 | Fill #0

## 2016-12-28 MED FILL — GABAPENTIN 800 MG TABLET: 800 | 90 days supply | Qty: 90 | Fill #2

## 2016-12-28 MED FILL — METHOCARBAMOL 500 MG TABS: 500 | 30 days supply | Qty: 90 | Fill #1

## 2016-12-28 NOTE — Telephone Encounter (Signed)
done

## 2016-12-28 NOTE — Telephone Encounter (Signed)
Are these okay to refill? 

## 2017-01-05 ENCOUNTER — Encounter: Payer: Self-pay | Admitting: Family Medicine

## 2017-01-05 ENCOUNTER — Ambulatory Visit
Admission: RE | Admit: 2017-01-05 | Discharge: 2017-01-05 | Disposition: A | Discharge status: Home / Self Care | Payer: Medicare Other | Source: Ambulatory Visit | Attending: Family Medicine | Admitting: Family Medicine

## 2017-01-05 ENCOUNTER — Ambulatory Visit: Payer: Medicare Other | Admitting: Family Medicine

## 2017-01-05 VITALS — BP 148/88 | HR 60 | Temp 98.7°F | Resp 16 | Wt 167.0 lb

## 2017-01-05 DIAGNOSIS — R0989 Other specified symptoms and signs involving the circulatory and respiratory systems: Secondary | ICD-10-CM

## 2017-01-05 DIAGNOSIS — R05 Cough: Secondary | ICD-10-CM

## 2017-01-05 DIAGNOSIS — H1032 Unspecified acute conjunctivitis, left eye: Secondary | ICD-10-CM

## 2017-01-05 DIAGNOSIS — Z8701 Personal history of pneumonia (recurrent): Secondary | ICD-10-CM

## 2017-01-05 DIAGNOSIS — R058 Other specified cough: Secondary | ICD-10-CM

## 2017-01-05 DIAGNOSIS — J4540 Moderate persistent asthma, uncomplicated: Secondary | ICD-10-CM

## 2017-01-05 LAB — CBC WITH DIFFERENTIAL/PLATELET
Basophils Absolute: 47 cells/uL (ref 0–200)
Basophils Relative: 0.6 %
Eosinophils Absolute: 174 cells/uL (ref 15–500)
Eosinophils Relative: 2.2 %
HEMATOCRIT: 38.7 % (ref 35.0–45.0)
HEMOGLOBIN: 13.1 g/dL (ref 11.7–15.5)
LYMPHS ABS: 2267 {cells}/uL (ref 850–3900)
MCH: 31.7 pg (ref 27.0–33.0)
MCHC: 33.9 g/dL (ref 32.0–36.0)
MCV: 93.7 fL (ref 80.0–100.0)
MPV: 11.4 fL (ref 7.5–12.5)
Monocytes Relative: 9.3 %
NEUTROS ABS: 4677 {cells}/uL (ref 1500–7800)
NEUTROS PCT: 59.2 %
Platelets: 190 10*3/uL (ref 140–400)
RBC: 4.13 10*6/uL (ref 3.80–5.10)
RDW: 13.4 % (ref 11.0–15.0)
Total Lymphocyte: 28.7 %
WBC: 7.9 10*3/uL (ref 3.8–10.8)
WBCMIX: 735 {cells}/uL (ref 200–950)

## 2017-01-05 MED ORDER — LEVOFLOXACIN 500 MG PO TABS: 500.0000 mg | ORAL_TABLET | Freq: Every day | ORAL | 0 refills | Status: DC

## 2017-01-05 MED ORDER — POLYMYXIN B-TRIMETHOPRIM 10000-0.1 UNIT/ML-% OP SOLN: 1.0000 [drp] | OPHTHALMIC | 0 refills | Status: DC

## 2017-01-05 MED FILL — levoFLOXacin 500 MG TABS: 500 | 7 days supply | Qty: 7 | Fill #0

## 2017-01-05 MED FILL — POLYMYXIN B/TMP EYE DROPS: 10000-0.1 | 30 days supply | Qty: 10 | Fill #0

## 2017-01-05 NOTE — Progress Notes (Signed)
Chief Complaint  Patient presents with  . Cough    Resuce inhaler, productive cough. drainage down back of throat x 2 weeks  . URI    running hot, left eye crusty several days ago, sticking together in morning  . Sinusitis    Subjective:  Beverly [name] is a 71 y.o. female with a fairly recent diagnosis of pneumonia and who also has underlying asthma. She presents with a 2 week history of productive cough, sinus pressure, thick purulent drainage with "blood specks" and fatigue.  States her energy level is waning similar to when she had pneumonia.   States she took Augmentin, steroids and levaquin in August-September and finally her symptoms resolved and she was back to her usual state of health for 2-3 weeks before onset of current symptoms.   She switched from Qvar to Symbicort in 10/2016 and is now back on Qvar. States she is having to use her rescue inhaler 2-3 times per day for the past 5 days.   Recently had negative Quantiferon testing.   She has been using saline nasal spray and Flonase but not helping with symptoms.    She also complains of a "sticky" discharge from her left eye over the past week.  Has been using artificial tears and baby shampoo for eye hygiene. She has been around a small child. Denies eye pain, foreign body sensation or vision changes.   Denies fever, chills, chest pain, palpitations, abdominal pain, N/V/D, urinary symptoms. No rash, arthralgias, myalgias. No recent change in weight. No LE edema.   Treatment to date: Mucinex, asthma meds, Flonase, saline nasal spray.  Denies sick contacts.  No other aggravating or relieving factors.  No other c/o.  ROS as in subjective.   Objective: Vitals:   01/05/17 1021  BP: (!) 148/88  Pulse: 60  Resp: 16  Temp: 98.7 F (37.1 C)  SpO2: 96%    General appearance: Alert, WD/WN, no distress, mildly ill appearing                             Skin: warm, no rash                           Head: no sinus  tenderness                            Eyes: Left conjunctiva with redness, clear drainage. Normal left upper lid, Right conjunctiva normal, corneas clear, PERRLA, EOMs intact.                            Ears: pearly TMs, external ear canals normal                          Nose: septum midline, turbinates swollen, with erythema and clear discharge             Mouth/throat: MMM, tongue normal, mild pharyngeal erythema, no edema or exudate.                            Neck: supple, no adenopathy, no thyromegaly, nontender                          Heart: RRR, normal  S1, S2, no murmurs                         Lungs: diminished lung sounds RLL otherwise clear, no wheezes, rales, or rhonchi. Normal work of breathing. Congested cough throughout visit.       Assessment: Cough with sputum - Plan: DG Chest 2 View, CBC with Differential/Platelet, levofloxacin (LEVAQUIN) 500 MG tablet  History of pneumonia - Plan: DG Chest 2 View, CBC with Differential/Platelet, levofloxacin (LEVAQUIN) 500 MG tablet  Abnormal lung sounds - Plan: DG Chest 2 View, CBC with Differential/Platelet, levofloxacin (LEVAQUIN) 500 MG tablet  Moderate persistent asthma, unspecified whether complicated - Plan: DG Chest 2 View  Acute conjunctivitis of left eye, unspecified acute conjunctivitis type - Plan: trimethoprim-polymyxin b (POLYTRIM) ophthalmic solution    Plan: She is not in any distress. Vitals wnl. Plan to check CBC, chest XR and start her levofloxacin.  Continue with OTC symptomatic treatment such as Mucinex and will continue with medication for underlying asthma.  Discussed case with Dr. Redmond School and he agrees with plan of care.  Follow up pending results or with Dr. Tomi Bamberger next week.

## 2017-01-05 NOTE — Patient Instructions (Signed)
Continue with the current symptomatic treatment for your cough (Mucinex and hydration) Treat you asthma and start the antibiotic.   For your eye, use the drops as prescribed.   We will call you with your CBC and XR result.   Follow up with Dr. Tomi Bamberger next week.

## 2017-01-10 NOTE — Progress Notes (Signed)
Chief Complaint  Patient presents with  . Follow-up    patient states that she is feeling much better, no more dark mucus-cleared up considerably.     Seen by Loletha Carrow on 11/20 with 2 week history of productive cough, sinus pressure, thick purulent drainage with "blood specks" and fatigue.  Stated her energy level was waning similar to when she had pneumonia.  She switched from Qvar to Symbicort in 10/2016 and was back on Qvar. She was having to use her rescue inhaler 2-3 times per day for 5 days prior to her visit.  She also had drainage from left eye.  She was prescribed polytrim for conjunctivitis, had CBC and CXR (see results below), and was treated with levofloxacin. She presents for follow-up today.  She is still using the eye drops, not 100% better. It is less red, still just slightly crusty in the mornings, but improving.  Right eye was never affected.    Breathing is better--still has some postnasal drainage, throat clearing and intermittent hoarseness.  Coughing much less overall.  Hasn't needed to use albuterol in the last couple of days.    She continues to take Mucinex (not 12 hour kind) at least 3 times/day, along with her regular allergy medications.  She needed to use generic Afrin at bedtime just early on, no longer needing.  She uses saline nasal spray multiple times/day, helped thin out the drainage.  She won't do sinus rinses. She feels the drainage is mostly coming from the left side, pressure in the left cheek, though improving.  Qvar was switched to Flovent 220 for insurance purposes--seems to be working well. She is on the last day of levaquin.   PMH, PSH, SH reviewed  Outpatient Encounter Medications as of 01/11/2017  Medication Sig  . acetaminophen (TYLENOL) 500 MG tablet Take 1,000 mg by mouth as needed. Reported on 05/15/2015  . albuterol (PROVENTIL HFA;VENTOLIN HFA) 108 (90 Base) MCG/ACT inhaler Inhale 2 puffs into the lungs every 6 (six) hours as needed for  wheezing or shortness of breath.  Marland Kitchen aspirin EC 81 MG tablet Take 81 mg by mouth daily.  Marland Kitchen atorvastatin (LIPITOR) 40 MG tablet TAKE 1 TABLET BY MOUTH DAILY.  . Calcium Carbonate-Vitamin D (CALCIUM 600+D) 600-200 MG-UNIT TABS Take 1 tablet by mouth 2 (two) times daily.    . celecoxib (CELEBREX) 200 MG capsule TAKE 1 CAPSULE BY MOUTH TWICE DAILY  . cetirizine (ZYRTEC) 10 MG tablet Take 10 mg by mouth daily.    Marland Kitchen esomeprazole (NEXIUM) 40 MG capsule TAKE 1 CAPSULE BY MOUTH DAILY BEFORE SUPPER.  Marland Kitchen estradiol (ESTRACE) 0.5 MG tablet TAKE 1 TABLET (0.5 MG TOTAL) BY MOUTH DAILY.  . fentaNYL (DURAGESIC - DOSED MCG/HR) 25 MCG/HR Place 1 patch onto the skin every 3 (three) days.    . fluticasone (FLONASE) 50 MCG/ACT nasal spray PLACE 2 SPRAYS INTO THE NOSE DAILY.  . fluticasone (FLOVENT HFA) 220 MCG/ACT inhaler Inhale 2 puffs into the lungs 2 (two) times daily.  Marland Kitchen gabapentin (NEURONTIN) 800 MG tablet Take 800 mg by mouth at bedtime.   Marland Kitchen levofloxacin (LEVAQUIN) 500 MG tablet Take 1 tablet (500 mg total) by mouth daily.  . methocarbamol (ROBAXIN) 500 MG tablet Take 500 mg by mouth daily with breakfast.  . montelukast (SINGULAIR) 10 MG tablet TAKE 1 TABLET BY MOUTH AT BEDTIME.  . Multiple Vitamins-Minerals (MULTIVITAMIN WITH MINERALS) tablet Take 1 tablet by mouth daily.  Marland Kitchen omega-3 acid ethyl esters (LOVAZA) 1 g capsule TAKE 2 CAPSULES BY  MOUTH 2 TIMES DAILY.  Marland Kitchen PARoxetine (PAXIL) 30 MG tablet Take 30 mg by mouth daily.   Marland Kitchen trimethoprim-polymyxin b (POLYTRIM) ophthalmic solution Place 1 drop into the left eye every 4 (four) hours.  . [DISCONTINUED] beclomethasone (QVAR) 80 MCG/ACT inhaler Inhale 1 puff into the lungs 2 (two) times daily.   . [DISCONTINUED] amoxicillin-clavulanate (AUGMENTIN) 875-125 MG tablet Take 1 tablet by mouth 2 (two) times daily. (Patient not taking: Reported on 01/05/2017)  . [DISCONTINUED] budesonide-formoterol (SYMBICORT) 160-4.5 MCG/ACT inhaler Inhale 2 puffs into the lungs 2 (two)  times daily. (Patient not taking: Reported on 01/05/2017)   No facility-administered encounter medications on file as of 01/11/2017.    Allergies  Allergen Reactions  . Adhesive [Tape] Rash  . Codeine Rash   ROS: no fever, chill, headache, dizziness, shortness of breath, nausea, vomiting, diarrhea, rash or other concern.  Some residual symptoms, per HPI.  PHYSICAL EXAM:  BP 120/72   Pulse 64   Temp 98.2 F (36.8 C) (Oral)   Ht _0  (1.626 m)   Wt 167 lb (75.8 kg)   SpO2 97%   BMI 28.67 kg/m   Well-appearing, pleasant female, in good spirits, in no distress HEENT: PERRL, EOMI, conjunctiva and sclera are clear, anicteric, no injection or crusting. (left eye is brown, right eye is blue). Nasal mucosa is moderately edematous on the right, no erythema or purulence. Left nares had moderate edema with recent bleeding from the septum and white mucus.  Sinuses nontender. TM's and EAC's normal. OP is clear.  Neck: no lymphadenopathy or mass Heart: bradycardic. Regular rhythm, with some frequent pauses/skipped beats. No murmur Lungs: clear bilaterally, no wheezes, rales, ronchi; good air movement. Skin: normal turgor, no rash Neuro: alert and oriented, cranial nerves intact,normal gait Psych: normal mood, affect, hygiene and grooming   Lab Results  Component Value Date   WBC 7.9 01/05/2017   HGB 13.1 01/05/2017   HCT 38.7 01/05/2017   MCV 93.7 01/05/2017   PLT 190 01/05/2017   CXR:  FINDINGS: The lungs are adequately inflated. There is no focal infiltrate. There is no pleural effusion. The heart and pulmonary vascularity are normal. The mediastinum is normal in width. The bony thorax is unremarkable.  IMPRESSION: There is no evidence of pneumonia nor other acute cardiopulmonary Abnormality.   ASSESSMENT/PLAN:  Acute non-recurrent sinusitis, unspecified location - improving; complete levaquin, continue Flovent and supportive measures  Acute conjunctivitis of left eye,  unspecified acute conjunctivitis type - significantly improved--continue polytrim for another 1-2 days, until sx completely resolve  Respiratory illness (that was likely sinus infection) and conjunctivitis are significantly improved; breathing is stable.  Discussed reasons to change to steroid/LABA combo, not currently needed.   Complete the course of levafloxacin. Continue mucinex and nasal saline along with your regular allergy medications. Be sure to aim the Flonase slightly laterally (not towards the septum).  Let us know if you find that you continue to need your rescue inhaler more than 2-3 times/week (as rescue), as you may need to have your asthma medications changed.  Sounds like you are doing quite well since switching to the Flovent, despite this illness.  Consider neti-pot or sinus rinse kit with future colds/viruses or flare of allergies.

## 2017-01-11 ENCOUNTER — Ambulatory Visit: Payer: Medicare Other | Admitting: Family Medicine

## 2017-01-11 ENCOUNTER — Encounter: Payer: Self-pay | Admitting: Family Medicine

## 2017-01-11 VITALS — BP 120/72 | HR 64 | Temp 98.2°F | Ht 64.0 in | Wt 167.0 lb

## 2017-01-11 DIAGNOSIS — J019 Acute sinusitis, unspecified: Secondary | ICD-10-CM | POA: Diagnosis not present

## 2017-01-11 DIAGNOSIS — H1032 Unspecified acute conjunctivitis, left eye: Secondary | ICD-10-CM

## 2017-01-11 NOTE — Patient Instructions (Signed)
  Complete the course of levafloxacin. Continue mucinex and nasal saline along with your regular allergy medications. Be sure to aim the Flonase slightly laterally (not towards the septum).  Let us know if you find that you continue to need your rescue inhaler more than 2-3 times/week (as rescue), as you may need to have your asthma medications changed.  Sounds like you are doing quite well since switching to the Flovent, despite this illness.  Consider neti-pot or sinus rinse kit with future colds/viruses or flare of allergies.

## 2017-01-21 MED FILL — FLUTICASONE PROP 50 MCG SPR: 50 | 90 days supply | Qty: 48 | Fill #2

## 2017-02-23 ENCOUNTER — Other Ambulatory Visit: Payer: Self-pay | Admitting: Family Medicine

## 2017-02-23 MED FILL — ATORVASTATIN 40 MG TABLET: 40 | 90 days supply | Qty: 90 | Fill #0

## 2017-02-23 MED FILL — MONTELUKAST SOD 10 MG TAB: 10 | 90 days supply | Qty: 90 | Fill #1

## 2017-02-23 MED FILL — ESOMEPRAZOLE MAG DR 40 MG C: 40 | 90 days supply | Qty: 90 | Fill #1

## 2017-03-11 ENCOUNTER — Encounter: Payer: Self-pay | Admitting: Family Medicine

## 2017-04-01 ENCOUNTER — Encounter: Payer: Self-pay | Admitting: Family Medicine

## 2017-04-02 ENCOUNTER — Other Ambulatory Visit: Payer: Self-pay | Admitting: Family Medicine

## 2017-04-02 MED ORDER — FLUTICASONE PROPIONATE HFA 220 MCG/ACT IN AERO: 2.0000 | INHALATION_SPRAY | Freq: Two times a day (BID) | RESPIRATORY_TRACT | 0 refills | Status: DC

## 2017-04-02 MED FILL — FLOVENT HFA 220 MCG INHALER: 220 | 30 days supply | Qty: 12 | Fill #0

## 2017-04-06 MED FILL — METHOCARBAMOL 500 MG TABS: 500 | 30 days supply | Qty: 90 | Fill #2

## 2017-04-06 MED FILL — GABAPENTIN 800 MG TABS: 800 | 90 days supply | Qty: 90 | Fill #0

## 2017-04-15 ENCOUNTER — Other Ambulatory Visit: Payer: Self-pay | Admitting: Family Medicine

## 2017-04-26 MED FILL — OMEGA-3 ETHYL ESTERS 1 GM C: 1 | 90 days supply | Qty: 360 | Fill #0

## 2017-04-27 MED FILL — CELECOXIB 200 MG CAPSULE: 200 | 90 days supply | Qty: 180 | Fill #1

## 2017-04-29 ENCOUNTER — Other Ambulatory Visit: Payer: Self-pay | Admitting: Family Medicine

## 2017-04-29 MED FILL — FLOVENT HFA 220 MCG INHALER: 220 | 30 days supply | Qty: 12 | Fill #0

## 2017-04-29 NOTE — Telephone Encounter (Signed)
Is this okay to refill? 

## 2017-05-03 MED FILL — ESTRADIOL 0.5 MG TABS: 0.5 | 90 days supply | Qty: 90 | Fill #1

## 2017-05-27 MED FILL — PROAIR HFA 90 MCG INHALER: 108 (90 BAS | 25 days supply | Qty: 9 | Fill #1

## 2017-06-03 ENCOUNTER — Other Ambulatory Visit: Payer: Self-pay | Admitting: Family Medicine

## 2017-06-03 MED FILL — ATORVASTATIN 40 MG TABLET: 40 | 90 days supply | Qty: 90 | Fill #1

## 2017-06-03 MED FILL — MONTELUKAST SOD 10 MG TAB: 10 | 90 days supply | Qty: 90 | Fill #0

## 2017-06-03 MED FILL — ESOMEPRAZOLE MAG DR 40 MG C: 40 | 90 days supply | Qty: 90 | Fill #0

## 2017-06-05 ENCOUNTER — Encounter: Payer: Self-pay | Admitting: Family Medicine

## 2017-06-08 NOTE — Progress Notes (Signed)
Chief Complaint  Patient presents with  . Medicare Wellness    fasting annual wellness/CPE (had breakfast @ 7:30) with pelvic. Had eye exam in the fall and prefers to do at eye doctor. Would like to address travel vaccines today as you know.    Beverly [name] is a 72 y.o. female who presents for annual physical exam, Medicare wellness visit and follow-up on chronic medical conditions.  She has the following concerns:  She will be traveling to [place] in June. She willl be gone for 8 days. She would like to discuss booster for Typhoid vaccine, Malaria prophylaxis and travelers diarrhea.  Going [place] the mountains, usually gets motion sickness/nauseated.  Asthma: Reports breathing has been very good lately. Uses Flovent and Singulair regularly without side effects. Hasn't needed any albuterol in a while. Had a rough year with sinus infection (12/2016), pneumonia (09/2016).  Allergies: She stays on zyrtec, flonase and singulair year-round. Currently allergies are well controlled, some increased drainage related to pollen currently.  Hyperlipidemia follow-up: Patient is reportedly following a low-fat, low cholesterol diet. Compliant with atorvastatin and denies any side effects. She is taking Lovaza--tolerating it much better than fish oil (which caused gas). Lab Results  Component Value Date   CHOL 151 06/11/2016   HDL 38 (L) 06/11/2016   LDLCALC 68 06/11/2016   TRIG 226 (H) 06/11/2016   CHOLHDL 4.0 06/11/2016   Postmenopausal symptoms--significantly improved since starting Estrace. Last mammogram was 11/2016.   GERD: Doing well on Nexium. She has recurrent heartburn if she misses a pill, or if she eats too late at night (rarely happens). Denies dysphagia.  Depression: Doing well on Paxil 30mg , without side effects. This is prescribed by Dr. Hardin Negus.   OSA: wear appliance from Dr. Toy Cookey.  Feels refreshed in the mornings.  She was sent to cardiology last year after an  episode of chest pain.  She saw the NP, had stress myovue test which was low risk.  She saw Dr. Johnsie Cancel in follow-up in 09/2016. She has had no further chest pain.  She has sinus bradycardia with 1st degree AV block and incomplete RBBB.  They had briefly discussed possibility of PPM in future but currently asymptomatic. Yearly ECG recommended. 06/2016:  Nuclear stress EF: 69%.  The study is normal.  This is a low risk study.  Blood pressure demonstrated a normal response to exercise.  There was no ST segment deviation noted during stress.  No T wave inversion was noted during stress.   Low risk stress nuclear study with normal perfusion and normal left ventricular regional and global systolic function.      Immunization History  Administered Date(s) Administered  . Hepatitis A 06/17/2006, 01/05/2007  . IPV 04/15/2012  . Influenza Split 10/31/2013  . Influenza, High Dose Seasonal PF 11/16/2016  . Influenza-Unspecified 11/09/2014, 11/14/2015  . Pneumococcal Conjugate-13 07/20/2013  . Pneumococcal Polysaccharide-23 06/17/2006, 11/26/2014  . Tdap 09/16/2004, 10/08/2014  . Typhoid Live 06/17/2011  . Yellow Fever 07/08/2012  . Zoster 04/04/2009  . Zoster Recombinat (Shingrix) 04/28/2016, 08/15/2016   She gets flu shots annually at work  Last Pap smear: 1997 (s/p hysterectomy)  Last mammogram: 11/2016 Last colonoscopy: 01/2012, Dr. Rich Fuchs diverticulosis; repeat in 10 years Last DEXA: 11/2013 T-1.2 (signific decline over 11 years, [facility] rec recheck 2 yrs, I feel can be longer) Dentist: twice yearly Ophtho: yearly Exercise: gardening 2x/week (vigorous, per pt)   Other doctors caring for patient include: Doctors caring for pt include Dr. Nicholaus Bloom (pain) Dr.  Toy Cookey (dentist) Dr. Sue Lush (endodontist) Dr. Satira Sark (periodontist) Dr. Denna Haggard (derm) Dr. Merri Ray (plastics at [facility]) Dr. Kathlen Mody (at [facility] ophtho) Dr. Janne Lab at [facility]--neuro-ophtho Dr. Maxie Better  (ortho) Dr. Deatra Ina (GI) Dr. Johnsie Cancel (cardiology)  Depression screen: negative Fall screen: negative Functional Status survey: unremarkable (sl stress incontinence) Mini-Cog screen: normal See full screens in epic   End of Life Discussion: Patient hasa living will and medical power of attorney   Past Medical History:  Diagnosis Date  . Allergy   . Amaurosis fugax    negative w/u through [facility]  . Asthma   . Back pain    Dr Joline Salt 02/2010-epidural injection x 2 at L4-5 with good effect  . BCC (basal cell carcinoma of skin)    Dr. Denna Haggard  . Cataract    left  . Chronic pain   . Depression   . Diverticulosis of colon 1998   mild  . DJD (degenerative joint disease)   . Duodenal ulcer    h/o  . GERD (gastroesophageal reflux disease)   . History of SCC (squamous cell carcinoma) of skin    Dr. Denna Haggard  . History of sinus bradycardia   . HLD (hyperlipidemia)    hypertriglyceridemia  . IBS (irritable bowel syndrome)   . Internal hemorrhoids 1998  . Mitral regurgitation    mild  . Ocular migraine 10/31/14   Dr.Hecker  . Osteoarthritis    feet,shoulder,neck,back,hips and hands.  . Vitamin D deficiency    mild    Past Surgical History:  Procedure Laterality Date  . BUNIONECTOMY     R 12/08, L 2004 (Dr. Janus Molder)  . CARPAL TUNNEL RELEASE  1989   bilateral  . CATARACT EXTRACTION Bilateral Left in 08/2012, Right 04/2016   Dr.Hecker  . CESAREAN SECTION     x2  . CHOLECYSTECTOMY  1992  . COLONOSCOPY  2006  . COLONOSCOPY  01/2012   due again 01/2022; mild diverticulosis  . epidural steroid injection, back  02/2010  . HIP SURGERY     right bursectomy x 3  . HIP SURGERY Right 2000   torn cartilage, repaired  . INGUINAL HERNIA REPAIR  9/09   bilat  . NECK SURGERY  1989   c6-7 cervical laminectomy and diskecotmy  . SHOULDER SURGERY  2/05   left rotator cuff repair  . TONSILLECTOMY  age 89  . TOTAL ABDOMINAL HYSTERECTOMY W/ BILATERAL SALPINGOOPHORECTOMY  1997    fibroids  . TROCHANTERIC BURSA EXCISION Right 1978  . TUBAL LIGATION  1986  . WISDOM TOOTH EXTRACTION      Social History   Socioeconomic History  . Marital status: Divorced    Spouse name: Not on file  . Number of children: 2  . Years of education: Not on file  . Highest education level: Not on file  Occupational History  . Occupation: STAFF Optician, dispensing: [employer]  Social Needs  . Financial resource strain: Not on file  . Food insecurity:    Worry: Not on file    Inability: Not on file  . Transportation needs:    Medical: Not on file    Non-medical: Not on file  Tobacco Use  . Smoking status: Never Smoker  . Smokeless tobacco: Never Used  Substance and Sexual Activity  . Alcohol use: Yes    Alcohol/week: 4.2 oz    Types: 7 Glasses of wine per week    Comment: 1 glass of wine daily.  . Drug use: No  .  Sexual activity: Not Currently  Lifestyle  . Physical activity:    Days per week: Not on file    Minutes per session: Not on file  . Stress: Not on file  Relationships  . Social connections:    Talks on phone: Not on file    Gets together: Not on file    Attends religious service: Not on file    Active member of club or organization: Not on file    Attends meetings of clubs or organizations: Not on file    Relationship status: Not on file  . Intimate partner violence:    Fear of current or ex partner: Not on file    Emotionally abused: Not on file    Physically abused: Not on file    Forced sexual activity: Not on file  Other Topics Concern  . 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    Son lives in [place] (2 grandchildren), daughter lives in [place], [place]    Family History  Problem Relation Age of Onset  . Depression Mother   . Schizophrenia Mother   . Cancer Father        oral  . Heart disease Father        bradycardia  . Cancer  Sister        skin and lung  . COPD Sister   . Hypertension Sister   . Osteoporosis Sister   . Hyperthyroidism Brother   . Cancer Brother        metastatic cancer to bone (?primary)  . Hyperlipidemia Daughter   . Asthma Son   . Cancer Paternal Grandmother 49       colon cancer, metastatic to liver  . Colon cancer Paternal Grandmother   . Diabetes Maternal Grandmother     Outpatient Encounter Medications as of 06/10/2017  Medication Sig Note  . aspirin EC 81 MG tablet Take 81 mg by mouth daily.   Marland Kitchen atorvastatin (LIPITOR) 40 MG tablet TAKE 1 TABLET BY MOUTH DAILY.   Marland Kitchen b complex vitamins tablet Take 1 tablet by mouth daily.   . Calcium Carbonate-Vitamin D (CALCIUM 600+D) 600-200 MG-UNIT TABS Take 1 tablet by mouth 2 (two) times daily.     . celecoxib (CELEBREX) 200 MG capsule TAKE 1 CAPSULE BY MOUTH TWICE DAILY   . cetirizine (ZYRTEC) 10 MG tablet Take 10 mg by mouth daily.     Marland Kitchen esomeprazole (NEXIUM) 40 MG capsule TAKE 1 CAPSULE BY MOUTH DAILY BEFORE SUPPER.   Marland Kitchen estradiol (ESTRACE) 0.5 MG tablet TAKE 1 TABLET (0.5 MG TOTAL) BY MOUTH DAILY.   . fentaNYL (DURAGESIC - DOSED MCG/HR) 25 MCG/HR Place 1 patch onto the skin every 3 (three) days.     Marland Kitchen FLOVENT HFA 220 MCG/ACT inhaler INHALE 2 PUFFS INTO THE LUNGS 2 (TWO) TIMES DAILY.   Marland Kitchen Fluorouracil (TOLAK) 4 % CREA Apply topically every 3 (three) days.   . fluticasone (FLONASE) 50 MCG/ACT nasal spray PLACE 2 SPRAYS INTO THE NOSE DAILY.   Marland Kitchen gabapentin (NEURONTIN) 800 MG tablet Take 800 mg by mouth at bedtime.    . methocarbamol (ROBAXIN) 500 MG tablet Take 500 mg by mouth daily with breakfast.   . montelukast (SINGULAIR) 10 MG tablet TAKE 1 TABLET BY MOUTH AT BEDTIME.   . Multiple Vitamins-Minerals (MULTIVITAMIN WITH MINERALS) tablet Take 1 tablet by mouth daily.   Marland Kitchen omega-3 acid ethyl esters (LOVAZA) 1 g capsule  TAKE 2 CAPSULES BY MOUTH 2 TIMES DAILY.   Marland Kitchen PARoxetine (PAXIL) 30 MG tablet Take 30 mg by mouth daily.    Marland Kitchen acetaminophen  (TYLENOL) 500 MG tablet Take 1,000 mg by mouth as needed. Reported on 05/15/2015   . albuterol (PROVENTIL HFA;VENTOLIN HFA) 108 (90 Base) MCG/ACT inhaler Inhale 2 puffs into the lungs every 6 (six) hours as needed for wheezing or shortness of breath. (Patient not taking: Reported on 06/10/2017)   . loperamide (IMODIUM A-D) 2 MG tablet Take 2 mg by mouth 4 (four) times daily as needed for diarrhea or loose stools. 06/10/2017: Uses prn diarrhea from IBS  . [DISCONTINUED] levofloxacin (LEVAQUIN) 500 MG tablet Take 1 tablet (500 mg total) by mouth daily.   . [DISCONTINUED] trimethoprim-polymyxin b (POLYTRIM) ophthalmic solution Place 1 drop into the left eye every 4 (four) hours.    No facility-administered encounter medications on file as of 06/10/2017.     Allergies  Allergen Reactions  . Adhesive [Tape] Rash  . Codeine Rash    ROS: The patient denies anorexia, fever, headaches, vision changes, decreased hearing, ear pain, sore throat, breast concerns, chest pain, palpitations, dizziness, syncope, dyspnea on exertion, swelling, nausea, vomiting, diarrhea, constipation, abdominal pain, melena, hematochezia, indigestion/heartburn, hematuria, dysuria, vaginal bleeding, discharge, odor or itch, genital lesions, numbness, tingling, weakness, tremor, suspicious skin lesions, depression, anxiety, abnormal bleeding/bruising, or enlarged lymph nodes.  +chronic back pain. Pain is well controlled. She has morning hand stiffness, and hip stiffness after sitting. Some trouble sleeping at night related to some pain in back with position changes. Gets back to sleep easily. Very slight stress urinary incontinence with cough (when bladder is full). Denies hot flashes since on HRT, very mild night sweats Ophthalmic migraines have decreased in frequency significantly (flashing once or twice a year) Slight nasal congestion from her allergies Mild constipation (controlled with fruits, drinking more water) Occasional  diarrhea (every 3 mos, relieved by imodium) +weight loss.  PHYSICAL EXAM:  BP (!) 142/84   Pulse (!) 56   Ht 5' 4.5" (1.638 m)   Wt 159 lb 12.8 oz (72.5 kg)   BMI 27.01 kg/m   134/68 on repeat by MD  Wt Readings from Last 3 Encounters:  06/10/17 159 lb 12.8 oz (72.5 kg)  01/11/17 167 lb (75.8 kg)  01/05/17 167 lb (75.8 kg)    General Appearance:  Alert, cooperative, no distress, appears stated age   Head:  Normocephalic, without obvious abnormality, atraumatic.  Eyes:  PERRL, conjunctiva/corneas clear, EOM's intact, fundi benign. Left eye is brown (superior portion is blue), right is blue.  Ears:  Normal TM's and external ear canals   Nose:  Nares normal, mucosa is mildly edematous, no erythema, drainage or sinus tenderness   Throat:  Lips, mucosa, and tongue normal; teeth and gums normal   Neck:  Supple, no lymphadenopathy; thyroid: no enlargement/ tenderness/nodules; no carotid bruit or JVD   Back:  Spine nontender, no curvature, ROM normal, no CVA tenderness   Lungs:  Clear to auscultation bilaterally without wheezes, rales or ronchi; respirations unlabored   Chest Wall:  No tenderness or deformity   Heart:  Regular rate and rhythm, with occasional skipped beat/slight pause. 2/6 SEM noted at apex, no rub or gallop   Breast Exam:  No tenderness, masses, or nipple discharge or inversion. No axillary lymphadenopathy   Abdomen:  Soft, non-tender, nondistended, normoactive bowel sounds,  no masses, no hepatosplenomegaly   Genitalia:  Normal external genitalia without lesions. No vaginal discharge.  Uterus surgically absent. No palpable adnexa or masses. Hard stool palpable in rectum posteriorly  Rectal:  Normal tone, no masses or tenderness; guaiac negative stool  Extremities:  No clubbing, cyanosis or edema   Pulses:  2+ and symmetric all extremities   Skin:  Skin color, texture, turgor normal. Scattered red patches on arms, left face (from meds  from Dr. Denna Haggard)  Lymph nodes:  Cervical, supraclavicular, and axillary nodes normal   Neurologic:  CNII-XII intact, normal strength, sensation and gait; reflexes 2+ and symmetric throughout    Psych:   Normal mood, affect, hygiene and grooming  Spirometry--normal.   ASSESSMENT/PLAN:  Annual physical exam - Plan: Comprehensive metabolic panel, Lipid panel, POCT Urinalysis DIP (Proadvantage Device)  Medicare annual wellness visit, subsequent  Mixed hyperlipidemia - Plan: Comprehensive metabolic panel, Lipid panel  Asthma in remission - Plan: fluticasone (FLOVENT HFA) 220 MCG/ACT inhaler, Spirometry with Graph  Postmenopausal symptoms - discussed potentially weaning slowly in the colder months as a trial  Chronic back pain, unspecified back location, unspecified back pain laterality  Gastroesophageal reflux disease, esophagitis presence not specified - stable, controlled on current regimen through Dr. Hardin Negus  Travel advice encounter - counseled re: risks/side effects and when/how to take meds in detail - Plan: typhoid (VIVOTIF) DR capsule, azithromycin (ZITHROMAX) 250 MG tablet, chloroquine (ARALEN) 500 MG tablet  Medication monitoring encounter - Plan: Comprehensive metabolic panel  Osteoarthritis of multiple joints, unspecified osteoarthritis type - Plan: celecoxib (CELEBREX) 200 MG capsule  Perennial allergic rhinitis - Plan: fluticasone (FLONASE) 50 MCG/ACT nasal spray  Full code, full care.   Chloroquine #6, vivotif #4 (qod), z-pak prn Traveler's diarrhea (taken 500mg  daily x 1-3d) vs as per usual instructions for sinus infection.  Discussed monthly self breast exams and yearly mammograms; at least 30 minutes of aerobic activity at least 5 days/week, weight-bearing exercise at least 2x/wk; proper sunscreen use reviewed; healthy diet, including goals of calcium and vitamin D intake and alcohol recommendations (less than or equal to 1 drink/day)  reviewed; regular seatbelt use; changing batteries in smoke detectors. Immunization recommendations discussed--continue yearly high dose flu shots yearly. Colonoscopy recommendations reviewed, UTD (due again 2023).  DEXA--only slight decline over 11 year time frame, and currently on HRT. Repeat not currently warranted.  We are giving you azithromycin to use if needed for traveler's diarrhea.  The dosing recommendation is 500mg  (2 tablets) once daily for 1-3 days, if needed.  Of course you can take it like a regular z-pak if you end up with a sinus infection or bronchitis as well.  Vivotif--take 1 capsule every other day for 4 doses, finishing at least a week prior to leaving.  Take an hour prior to meal, and not with hot liquids.  Chloroquine--start 1-2 weeks prior to leaving, take with food. Continue for 4 weeks after returning.  I recommend trying meclizine as needed for motion sickness.  I believe this is available as Dramamine-N (nausea) or other brands, such as Bonine.  In the cooler weather, consider trying to taper back on the Estrace--if the tablets aren't cuttable, consider skipping every 3rd day, then every other and see if you are able to tolerate either less frequent dosing, or tapering off all together.  No rush, but the goal is to stay on this long-term, and to be on the lowest effective dose to help with symptoms.    Medicare Attestation I have personally reviewed: The patient's medical and social history Their use of alcohol, tobacco or illicit drugs Their current  medications and supplements The patient's functional ability including ADLs,fall risks, home safety risks, cognitive, and hearing and visual impairment Diet and physical activities Evidence for depression or mood disorders  The patient's weight, height and BMI have been recorded in the chart.  I have made referrals, counseling, and provided education to the patient based on review of the above and I have provided  the patient with a written personalized care plan for preventive services.

## 2017-06-10 ENCOUNTER — Encounter: Payer: Self-pay | Admitting: Family Medicine

## 2017-06-10 ENCOUNTER — Ambulatory Visit: Payer: Medicare Other | Admitting: Family Medicine

## 2017-06-10 VITALS — BP 134/68 | HR 56 | Ht 64.5 in | Wt 159.8 lb

## 2017-06-10 DIAGNOSIS — E782 Mixed hyperlipidemia: Secondary | ICD-10-CM

## 2017-06-10 DIAGNOSIS — Z7189 Other specified counseling: Secondary | ICD-10-CM

## 2017-06-10 DIAGNOSIS — G8929 Other chronic pain: Secondary | ICD-10-CM | POA: Diagnosis not present

## 2017-06-10 DIAGNOSIS — N959 Unspecified menopausal and perimenopausal disorder: Secondary | ICD-10-CM | POA: Diagnosis not present

## 2017-06-10 DIAGNOSIS — J45998 Other asthma: Secondary | ICD-10-CM

## 2017-06-10 DIAGNOSIS — Z0000 Encounter for general adult medical examination without abnormal findings: Secondary | ICD-10-CM | POA: Diagnosis not present

## 2017-06-10 DIAGNOSIS — K219 Gastro-esophageal reflux disease without esophagitis: Secondary | ICD-10-CM

## 2017-06-10 DIAGNOSIS — J3089 Other allergic rhinitis: Secondary | ICD-10-CM | POA: Diagnosis not present

## 2017-06-10 DIAGNOSIS — Z7184 Encounter for health counseling related to travel: Secondary | ICD-10-CM

## 2017-06-10 DIAGNOSIS — M159 Polyosteoarthritis, unspecified: Secondary | ICD-10-CM | POA: Diagnosis not present

## 2017-06-10 DIAGNOSIS — M549 Dorsalgia, unspecified: Secondary | ICD-10-CM

## 2017-06-10 DIAGNOSIS — Z5181 Encounter for therapeutic drug level monitoring: Secondary | ICD-10-CM | POA: Diagnosis not present

## 2017-06-10 LAB — POCT URINALYSIS DIP (PROADVANTAGE DEVICE)
Bilirubin, UA: NEGATIVE
Blood, UA: NEGATIVE
Glucose, UA: NEGATIVE mg/dL
Ketones, POC UA: NEGATIVE mg/dL
LEUKOCYTES UA: NEGATIVE
NITRITE UA: NEGATIVE
Protein Ur, POC: NEGATIVE mg/dL
Specific Gravity, Urine: 1.02
UUROB: NEGATIVE
pH, UA: 6 (ref 5.0–8.0)

## 2017-06-10 MED ORDER — CELECOXIB 200 MG PO CAPS: 200.0000 mg | ORAL_CAPSULE | Freq: Two times a day (BID) | ORAL | 1 refills | Status: DC

## 2017-06-10 MED ORDER — FLUTICASONE PROPIONATE HFA 220 MCG/ACT IN AERO: INHALATION_SPRAY | RESPIRATORY_TRACT | 3 refills | Status: DC

## 2017-06-10 MED ORDER — FLUTICASONE PROPIONATE 50 MCG/ACT NA SUSP: NASAL | 3 refills | Status: DC

## 2017-06-10 MED ORDER — CHLOROQUINE PHOSPHATE 500 MG PO TABS: ORAL_TABLET | ORAL | 0 refills | Status: DC

## 2017-06-10 MED ORDER — TYPHOID VACCINE PO CPDR: 1.0000 | DELAYED_RELEASE_CAPSULE | ORAL | 0 refills | Status: DC

## 2017-06-10 MED ORDER — AZITHROMYCIN 250 MG PO TABS: ORAL_TABLET | ORAL | 0 refills | Status: DC

## 2017-06-10 MED FILL — AZITHROMYCIN 250 MG TABLET: 250 | 3 days supply | Qty: 6 | Fill #0

## 2017-06-10 MED FILL — CHLOROQUINE PH 500 MG TAB: 500 | 42 days supply | Qty: 6 | Fill #0

## 2017-06-10 MED FILL — VIVOTIF EC CAPSULE: 8 days supply | Qty: 4 | Fill #0

## 2017-06-10 NOTE — Patient Instructions (Addendum)
HEALTH MAINTENANCE RECOMMENDATIONS:  It is recommended that you get at least 30 minutes of aerobic exercise at least 5 days/week (for weight loss, you may need as much as 60-90 minutes). This can be any activity that gets your heart rate up. This can be divided in 10-15 minute intervals if needed, but try and build up your endurance at least once a week.  Weight bearing exercise is also recommended twice weekly.  Eat a healthy diet with lots of vegetables, fruits and fiber.  "Colorful" foods have a lot of vitamins (ie green vegetables, tomatoes, red peppers, etc).  Limit sweet tea, regular sodas and alcoholic beverages, all of which has a lot of calories and sugar.  Up to 1 alcoholic drink daily may be beneficial for women (unless trying to lose weight, watch sugars).  Drink a lot of water.  Calcium recommendations are 1200-1500 mg daily (1500 mg for postmenopausal women or women without ovaries), and vitamin D 1000 IU daily.  This should be obtained from diet and/or supplements (vitamins), and calcium should not be taken all at once, but in divided doses.  Monthly self breast exams and yearly mammograms for women over the age of 3 is recommended.  Sunscreen of at least SPF 30 should be used on all sun-exposed parts of the skin when outside between the hours of 10 am and 4 pm (not just when at beach or pool, but even with exercise, golf, tennis, and yard work!)  Use a sunscreen that says "broad spectrum" so it covers both UVA and UVB rays, and make sure to reapply every 1-2 hours.  Remember to change the batteries in your smoke detectors when changing your clock times in the spring and fall.  Use your seat belt every time you are in a car, and please drive safely and not be distracted with cell phones and texting while driving.   Ms. Shuping , Thank you for taking time to come for your Medicare Wellness Visit. I appreciate your ongoing commitment to your health goals. Please review the  following plan we discussed and let me know if I can assist you in the future.   These are the goals we discussed: Goals    None      This is a list of the screening recommended for you and due dates:  Health Maintenance  Topic Date Due  . Flu Shot  09/16/2017  . Mammogram  12/06/2018  . Colon Cancer Screening  01/21/2022  . Tetanus Vaccine  10/07/2024  . DEXA scan (bone density measurement)  Completed  .  Hepatitis C: One time screening is recommended by Center for Disease Control  (CDC) for  adults born from [birth year] through 1963.   Completed  . Pneumonia vaccines  Completed    Continue yearly mammograms (due again 11/2017 (not 2020 as stated above).  We are giving you azithromycin to use if needed for traveler's diarrhea.  The dosing recommendation is 500mg  (2 tablets) once daily for 1-3 days, if needed.  Of course you can take it like a regular z-pak if you end up with a sinus infection or bronchitis as well.  Vivotif--take 1 capsule every other day for 4 doses, finishing at least a week prior to leaving.  Take an hour prior to meal, and not with hot liquids.  Chloroquine--start 1-2 weeks prior to leaving, take with food. Continue for 4 weeks after returning.  I recommend trying meclizine as needed for motion sickness.  I believe this is available  as Dramamine-N (nausea) or other brands, such as Bonine.  In the cooler weather, consider trying to taper back on the Estrace--if the tablets aren't cuttable, consider skipping every 3rd day, then every other and see if you are able to tolerate either less frequent dosing, or tapering off all together.  No rush, but the goal is to stay on this long-term, and to be on the lowest effective dose to help with symptoms.

## 2017-06-11 LAB — LIPID PANEL
CHOLESTEROL TOTAL: 146 mg/dL (ref 100–199)
Chol/HDL Ratio: 3.1 ratio (ref 0.0–4.4)
HDL: 47 mg/dL (ref >39)
LDL Calculated: 72 mg/dL (ref 0–99)
TRIGLYCERIDES: 135 mg/dL (ref 0–149)
VLDL Cholesterol Cal: 27 mg/dL (ref 5–40)

## 2017-06-11 LAB — COMPREHENSIVE METABOLIC PANEL
ALBUMIN: 4.7 g/dL (ref 3.6–4.8)
ALK PHOS: 80 IU/L (ref 39–117)
ALT: 24 IU/L (ref 0–32)
AST: 26 IU/L (ref 0–40)
Albumin/Globulin Ratio: 2.5 — ABNORMAL HIGH (ref 1.2–2.2)
BILIRUBIN TOTAL: 0.3 mg/dL (ref 0.0–1.2)
BUN / CREAT RATIO: 24 (ref 12–28)
BUN: 14 mg/dL (ref 8–27)
CHLORIDE: 106 mmol/L (ref 96–106)
CO2: 24 mmol/L (ref 20–29)
CREATININE: 0.59 mg/dL (ref 0.57–1.00)
Calcium: 9.6 mg/dL (ref 8.7–10.3)
GFR calc Af Amer: 108 mL/min/{1.73_m2} (ref >59)
GFR calc non Af Amer: 94 mL/min/{1.73_m2} (ref >59)
GLOBULIN, TOTAL: 1.9 g/dL (ref 1.5–4.5)
Glucose: 87 mg/dL (ref 65–99)
POTASSIUM: 4 mmol/L (ref 3.5–5.2)
SODIUM: 145 mmol/L — AB (ref 134–144)
Total Protein: 6.6 g/dL (ref 6.0–8.5)

## 2017-06-16 ENCOUNTER — Encounter: Payer: Self-pay | Admitting: Family Medicine

## 2017-06-16 ENCOUNTER — Encounter: Payer: Self-pay | Admitting: *Deleted

## 2017-06-28 MED FILL — FLOVENT HFA 220 MCG INHALER: 220 | 90 days supply | Qty: 36 | Fill #0

## 2017-06-28 MED FILL — METHOCARBAMOL 500 MG TABLET: 500 | 30 days supply | Qty: 90 | Fill #3

## 2017-07-08 MED FILL — GABAPENTIN 800 MG TABS: 800 | 90 days supply | Qty: 90 | Fill #1

## 2017-07-16 MED FILL — CELECOXIB 200 MG CAPSULE: 200 | 90 days supply | Qty: 180 | Fill #0

## 2017-08-05 ENCOUNTER — Ambulatory Visit: Payer: Medicare Other | Admitting: Family Medicine

## 2017-08-05 ENCOUNTER — Encounter: Payer: Self-pay | Admitting: Family Medicine

## 2017-08-05 ENCOUNTER — Other Ambulatory Visit: Payer: Self-pay | Admitting: *Deleted

## 2017-08-05 VITALS — BP 128/72 | HR 56 | Temp 97.8°F | Ht 64.0 in | Wt 160.6 lb

## 2017-08-05 DIAGNOSIS — J4531 Mild persistent asthma with (acute) exacerbation: Secondary | ICD-10-CM

## 2017-08-05 MED ORDER — OMEGA-3-ACID ETHYL ESTERS 1 G PO CAPS: ORAL_CAPSULE | ORAL | 1 refills | Status: DC

## 2017-08-05 MED ORDER — PREDNISONE 10 MG (21) PO TBPK: ORAL_TABLET | ORAL | 0 refills | Status: DC

## 2017-08-05 MED FILL — predniSONE 10 MG (21) TBPK: 10 | 6 days supply | Qty: 21 | Fill #0

## 2017-08-05 MED FILL — OMEGA-3 ETHYL ESTERS 1 GM C: 1 | 90 days supply | Qty: 360 | Fill #0

## 2017-08-05 NOTE — Patient Instructions (Signed)
Continue to stay well hydrated. Continue mucinex if phlegm is thick. Continue to monitor color and consistency of mucus--if getting darker/thicker rather than improving, let us know and we can start a different antibiotic.  Take the prednisone taper as directed (60/50/40/30/20/10). Return next week if not improving or if worse.  To urgent care over the weekend if significantly worse--dyspnea, high fever.

## 2017-08-05 NOTE — Progress Notes (Signed)
Chief Complaint  Patient presents with  . Cough    productive cough for almost a week. Did start the zpak that was given for diarrhea while in [place]-finished that on Tues 08/03/17. Mucus is discolored from her chest but not from her nose. No fevers.    Got home from [place] on 6/15. She had started feeling bad prior to the trip home. She had been wheezing.  She has been using Flovent religiously. She has been needing to use albuterol 3-4 times daily for wheezing since returning home.  She reports there is a lot of pollution in [place], plus is dusty and hot.  She hasn't had any known fevers. Nasal drainage has been clear, but the cough is productive of deep yellow phlegm (specks mixed with clear).  She started taking Zpak on 6/14.  She hasn't noticed significant change in color of phlegm or wheezing since taking it.  Using mucinex 4x/day. Still taking claritin nightly (switches off, currently on claritin, not zyrtec), and using flonase daily.   PMH, PSH, SH reviewed  Outpatient Encounter Medications as of 08/05/2017  Medication Sig Note  . albuterol (PROVENTIL HFA;VENTOLIN HFA) 108 (90 Base) MCG/ACT inhaler Inhale 2 puffs into the lungs every 6 (six) hours as needed for wheezing or shortness of breath.   Marland Kitchen aspirin EC 81 MG tablet Take 81 mg by mouth daily.   Marland Kitchen atorvastatin (LIPITOR) 40 MG tablet TAKE 1 TABLET BY MOUTH DAILY.   Marland Kitchen b complex vitamins tablet Take 1 tablet by mouth daily.   . Calcium Carbonate-Vitamin D (CALCIUM 600+D) 600-200 MG-UNIT TABS Take 1 tablet by mouth 2 (two) times daily.     . celecoxib (CELEBREX) 200 MG capsule Take 1 capsule (200 mg total) by mouth 2 (two) times daily.   . cetirizine (ZYRTEC) 10 MG tablet Take 10 mg by mouth daily.     . chloroquine (ARALEN) 500 MG tablet 1 tablet weekly, starting 1 week prior, continuing 4 weeks after   . esomeprazole (NEXIUM) 40 MG capsule TAKE 1 CAPSULE BY MOUTH DAILY BEFORE SUPPER.   Marland Kitchen estradiol (ESTRACE) 0.5 MG tablet TAKE 1 TABLET  (0.5 MG TOTAL) BY MOUTH DAILY.   . fentaNYL (DURAGESIC - DOSED MCG/HR) 25 MCG/HR Place 1 patch onto the skin every 3 (three) days.     . fluticasone (FLONASE) 50 MCG/ACT nasal spray PLACE 2 SPRAYS INTO THE NOSE DAILY.   . fluticasone (FLOVENT HFA) 220 MCG/ACT inhaler INHALE 2 PUFFS INTO THE LUNGS 2 (TWO) TIMES DAILY.   Marland Kitchen gabapentin (NEURONTIN) 800 MG tablet Take 800 mg by mouth at bedtime.    Marland Kitchen guaiFENesin (MUCINEX PO) Take 1 tablet by mouth every 4 (four) hours.   . methocarbamol (ROBAXIN) 500 MG tablet Take 500 mg by mouth daily with breakfast.   . montelukast (SINGULAIR) 10 MG tablet TAKE 1 TABLET BY MOUTH AT BEDTIME.   . Multiple Vitamins-Minerals (MULTIVITAMIN WITH MINERALS) tablet Take 1 tablet by mouth daily.   Marland Kitchen PARoxetine (PAXIL) 30 MG tablet Take 30 mg by mouth daily.    . [DISCONTINUED] omega-3 acid ethyl esters (LOVAZA) 1 g capsule TAKE 2 CAPSULES BY MOUTH 2 TIMES DAILY.   Marland Kitchen acetaminophen (TYLENOL) 500 MG tablet Take 1,000 mg by mouth as needed. Reported on 05/15/2015   . azithromycin (ZITHROMAX) 250 MG tablet Take 2 tablets once daily for 1-3 days as needed for traveler's diarrhea (Patient not taking: Reported on 08/05/2017)   . Fluorouracil (TOLAK) 4 % CREA Apply topically every 3 (three) days.   Marland Kitchen  loperamide (IMODIUM A-D) 2 MG tablet Take 2 mg by mouth 4 (four) times daily as needed for diarrhea or loose stools. 06/10/2017: Uses prn diarrhea from IBS  . predniSONE (STERAPRED UNI-PAK 21 TAB) 10 MG (21) TBPK tablet Take as directed   . [DISCONTINUED] typhoid (VIVOTIF) DR capsule Take 1 capsule by mouth every other day.    No facility-administered encounter medications on file as of 08/05/2017.    (not taking prednisone prior to today's visit).  Allergies  Allergen Reactions  . Adhesive [Tape] Rash  . Codeine Rash   ROS:  No known fever, headache, dizziness, chest pain.  +wheezing/shortness of breath, cough and fatigue (worse after meals), per HPI. No vomiting or diarrhea. No  abdominal pain, bleeding, bruising, skin rash, pain or other concerns.  PHYSICAL EXAM:  BP 128/72   Pulse (!) 56   Temp 97.8 F (36.6 C) (Tympanic)   Ht 5\' 4"  (1.626 m)   Wt 160 lb 9.6 oz (72.8 kg)   SpO2 97%   BMI 27.57 kg/m   Well appearing, pleasant female, speaking easily in full sentences, in no distress, with occasional dry cough. HEENT: conjunctiva and sclera are clear, EOMI. Nasal mucosa is mild-mod edematous with clear mucus.  No erythema. Sinuses are nontender OP is clear TM's and EAC's normal Neck: no lymphadenopathy, or mass Heart: regular rate and rhythm, no murmur Lungs: good air movement. No wheezes, rales, ronchi Extremities: no edema Skin: normal turgor, no rash Psych: normal mood, affect, hygiene and grooming  PF 410/365/400 Recently used albuterol   ASSESSMENT/PLAN:  Mild persistent asthmatic bronchitis with exacerbation - lungs sound okay with good peak flow, but requiring albuterol frequently--treat with prednisone taper. Hold off on add'l ABX (zpak still in system) unless worse - Plan: predniSONE (STERAPRED UNI-PAK 21 TAB) 10 MG (21) TBPK tablet   Peak flow is pretty good, but recently used albuterol. Will treat with prednisone course. No change in ABX for now, zpak still on board and looks good clinically. Contact us if ongoing discolored phlegm or worsening symptoms. May add ABX (ie Doxy)

## 2017-08-06 ENCOUNTER — Encounter: Payer: Self-pay | Admitting: Family Medicine

## 2017-08-07 ENCOUNTER — Encounter: Payer: Self-pay | Admitting: Family Medicine

## 2017-08-07 MED ORDER — AMOXICILLIN-POT CLAVULANATE 875-125 MG PO TABS: 1.0000 | ORAL_TABLET | Freq: Two times a day (BID) | ORAL | 0 refills | Status: DC

## 2017-08-09 ENCOUNTER — Encounter: Payer: Self-pay | Admitting: Family Medicine

## 2017-08-09 MED FILL — ESTRADIOL 0.5 MG TABS: 0.5 | 90 days supply | Qty: 90 | Fill #2

## 2017-08-13 ENCOUNTER — Ambulatory Visit: Payer: Medicare Other | Admitting: Medical

## 2017-08-13 ENCOUNTER — Encounter: Payer: Self-pay | Admitting: Family Medicine

## 2017-08-13 VITALS — BP 130/70 | HR 62 | Temp 98.1°F | Resp 16 | Ht 64.0 in | Wt 160.4 lb

## 2017-08-13 DIAGNOSIS — J4540 Moderate persistent asthma, uncomplicated: Secondary | ICD-10-CM

## 2017-08-13 MED ORDER — PROMETHAZINE-DM 6.25-15 MG/5ML PO SYRP: 5.0000 mL | ORAL_SOLUTION | Freq: Four times a day (QID) | ORAL | 0 refills | Status: DC | PRN

## 2017-08-13 MED ORDER — FLUTICASONE-SALMETEROL 500-50 MCG/DOSE IN AEPB: 1.0000 | INHALATION_SPRAY | Freq: Two times a day (BID) | RESPIRATORY_TRACT | 0 refills | Status: DC

## 2017-08-13 MED FILL — PROMETHAZINE W/DM SYRUP: 6.25-15 | 6 days supply | Qty: 120 | Fill #0

## 2017-08-13 NOTE — Progress Notes (Signed)
Subjective: Chief Complaint  Patient presents with  . bronchitis    bronchitis,    Here for not feeling well, not improving.  Been going back and forth with Dr. Tomi Bamberger by email.   Saw Dr. Tomi Bamberger a week ago.  Was put on prednisone dose pak.   Had previously did azithromycin.   Was put on Augmentin 6 days ago.   Very tired, not sleeping well, blowing out copious colored brown mucous.   Sent Dr. Tomi Bamberger another message today about not improving.  coughing all night if she moves or changes position.   Using albuterol several times daily.   Takes Nexium in general for GERD.  She was just in [place] recently on medical mission trip.  No other aggravating or relieving factors. No other complaint.   Past Medical History:  Diagnosis Date  . Allergy   . Amaurosis fugax    negative w/u through [facility]  . Asthma   . Back pain    Dr Joline Salt 02/2010-epidural injection x 2 at L4-5 with good effect  . BCC (basal cell carcinoma of skin)    Dr. Denna Haggard  . Cataract    left  . Chronic pain   . Depression   . Diverticulosis of colon 1998   mild  . DJD (degenerative joint disease)   . Duodenal ulcer    h/o  . GERD (gastroesophageal reflux disease)   . History of SCC (squamous cell carcinoma) of skin    Dr. Denna Haggard  . History of sinus bradycardia   . HLD (hyperlipidemia)    hypertriglyceridemia  . IBS (irritable bowel syndrome)   . Internal hemorrhoids 1998  . Mitral regurgitation    mild  . Ocular migraine 10/31/14   Dr.Hecker  . Osteoarthritis    feet,shoulder,neck,back,hips and hands.  . Vitamin D deficiency    mild   Current Outpatient Medications on File Prior to Visit  Medication Sig Dispense Refill  . acetaminophen (TYLENOL) 500 MG tablet Take 1,000 mg by mouth as needed. Reported on 05/15/2015    . albuterol (PROVENTIL HFA;VENTOLIN HFA) 108 (90 Base) MCG/ACT inhaler Inhale 2 puffs into the lungs every 6 (six) hours as needed for wheezing or shortness of breath. 18 g 1  .  amoxicillin-clavulanate (AUGMENTIN) 875-125 MG tablet Take 1 tablet by mouth 2 (two) times daily. 20 tablet 0  . aspirin EC 81 MG tablet Take 81 mg by mouth daily.    Marland Kitchen atorvastatin (LIPITOR) 40 MG tablet TAKE 1 TABLET BY MOUTH DAILY. 90 tablet 1  . b complex vitamins tablet Take 1 tablet by mouth daily.    . Calcium Carbonate-Vitamin D (CALCIUM 600+D) 600-200 MG-UNIT TABS Take 1 tablet by mouth 2 (two) times daily.      . celecoxib (CELEBREX) 200 MG capsule Take 1 capsule (200 mg total) by mouth 2 (two) times daily. 180 capsule 1  . cetirizine (ZYRTEC) 10 MG tablet Take 10 mg by mouth daily.      . chloroquine (ARALEN) 500 MG tablet 1 tablet weekly, starting 1 week prior, continuing 4 weeks after 6 tablet 0  . esomeprazole (NEXIUM) 40 MG capsule TAKE 1 CAPSULE BY MOUTH DAILY BEFORE SUPPER. 90 capsule 0  . estradiol (ESTRACE) 0.5 MG tablet TAKE 1 TABLET (0.5 MG TOTAL) BY MOUTH DAILY. 90 tablet 3  . fentaNYL (DURAGESIC - DOSED MCG/HR) 25 MCG/HR Place 1 patch onto the skin every 3 (three) days.      . fluticasone (FLONASE) 50 MCG/ACT nasal spray PLACE 2  SPRAYS INTO THE NOSE DAILY. 48 g 3  . fluticasone (FLOVENT HFA) 220 MCG/ACT inhaler INHALE 2 PUFFS INTO THE LUNGS 2 (TWO) TIMES DAILY. 36 g 3  . gabapentin (NEURONTIN) 800 MG tablet Take 800 mg by mouth at bedtime.     Marland Kitchen guaiFENesin (MUCINEX PO) Take 1 tablet by mouth every 4 (four) hours.    Marland Kitchen loperamide (IMODIUM A-D) 2 MG tablet Take 2 mg by mouth 4 (four) times daily as needed for diarrhea or loose stools.    . methocarbamol (ROBAXIN) 500 MG tablet Take 500 mg by mouth daily with breakfast.    . montelukast (SINGULAIR) 10 MG tablet TAKE 1 TABLET BY MOUTH AT BEDTIME. 90 tablet 0  . Multiple Vitamins-Minerals (MULTIVITAMIN WITH MINERALS) tablet Take 1 tablet by mouth daily.    Marland Kitchen omega-3 acid ethyl esters (LOVAZA) 1 g capsule TAKE 2 CAPSULES BY MOUTH 2 TIMES DAILY. 360 capsule 1  . PARoxetine (PAXIL) 30 MG tablet Take 30 mg by mouth daily.     .  Fluorouracil (TOLAK) 4 % CREA Apply topically every 3 (three) days.     No current facility-administered medications on file prior to visit.    ROS as in subjective   Objective: BP 130/70   Pulse 62   Temp 98.1 F (36.7 C) (Oral)   Resp 16   Ht 5\' 4"  (1.626 m)   Wt 160 lb 6.4 oz (72.8 kg)   SpO2 97%   BMI 27.53 kg/m   General appearance: Alert, WD/WN, no distress                             Skin: warm, no rash, no diaphoresis                           Head: no sinus tenderness                            Eyes: conjunctiva normal, corneas clear, PERRLA                            Ears: pearly TMs, external ear canals normal                          Nose: septum midline, turbinates swollen, with erythema and clear discharge             Mouth/throat: MMM, tongue normal, mild pharyngeal erythema                           Neck: supple, no adenopathy, no thyromegaly, non tender                          Heart: RRR, normal S1, S2, no murmurs                         Lungs: +bronchial breath sounds, +scattered rhonchi, no wheezes, no rales                Extremities: no edema, non tender       Assessment: Encounter Diagnosis  Name Primary?  . Moderate persistent asthmatic bronchitis without complication Yes     Plan Finish Augmentin, hydrate well, c/t albuterol  prn, can use cough medication below short term and 1 week sample of Advair to help calm down current flare up.   Call/return if worse or not improving in the next week  Beverly [name] was seen today for bronchitis.  Diagnoses and all orders for this visit:  Moderate persistent asthmatic bronchitis without complication  Other orders -     promethazine-dextromethorphan (PROMETHAZINE-DM) 6.25-15 MG/5ML syrup; Take 5 mLs by mouth 4 (four) times daily as needed for cough. -     Fluticasone-Salmeterol (ADVAIR DISKUS) 500-50 MCG/DOSE AEPB; Inhale 1 puff into the lungs 2 (two) times daily.

## 2017-08-20 ENCOUNTER — Encounter: Payer: Self-pay | Admitting: Family Medicine

## 2017-08-24 ENCOUNTER — Encounter: Payer: Self-pay | Admitting: Medical

## 2017-08-30 ENCOUNTER — Other Ambulatory Visit: Payer: Self-pay | Admitting: Family Medicine

## 2017-08-30 ENCOUNTER — Encounter: Payer: Self-pay | Admitting: Family Medicine

## 2017-08-30 MED FILL — ATORVASTATIN 40 MG TABLET: 40 | 90 days supply | Qty: 90 | Fill #0

## 2017-08-30 MED FILL — ESOMEPRAZOLE MAG DR 40 MG C: 40 | 90 days supply | Qty: 90 | Fill #0

## 2017-09-01 ENCOUNTER — Encounter: Payer: Self-pay | Admitting: Family Medicine

## 2017-09-01 ENCOUNTER — Ambulatory Visit
Admission: RE | Admit: 2017-09-01 | Discharge: 2017-09-01 | Disposition: A | Discharge status: Home / Self Care | Payer: Medicare Other | Source: Ambulatory Visit | Attending: Family Medicine | Admitting: Family Medicine

## 2017-09-01 ENCOUNTER — Ambulatory Visit: Payer: Medicare Other | Admitting: Family Medicine

## 2017-09-01 VITALS — BP 130/72 | HR 60 | Temp 97.9°F | Ht 64.0 in | Wt 160.8 lb

## 2017-09-01 DIAGNOSIS — R5383 Other fatigue: Secondary | ICD-10-CM | POA: Diagnosis not present

## 2017-09-01 DIAGNOSIS — R05 Cough: Secondary | ICD-10-CM

## 2017-09-01 DIAGNOSIS — R059 Cough, unspecified: Secondary | ICD-10-CM

## 2017-09-01 DIAGNOSIS — J45998 Other asthma: Secondary | ICD-10-CM

## 2017-09-01 NOTE — Progress Notes (Signed)
Subjective:     Patient ID: Beverly [name], female   DOB: 06-Oct-1945, 73 y.o.   MRN: [id number]   Chief Complaint  Patient presents with  . Cough    still not better. Still coughing, green mucus. Fatigue-sleeping most of the day and night. Hoarseness.      BP 130/72   Pulse 60   Temp 97.9 F (36.6 C) (Tympanic)   Ht 5\' 4"  (1.626 m)   Wt 160 lb 12.8 oz (72.9 kg)   BMI 27.60 kg/m    HPI   Patient presents with a 4 week history of fatigue, post nasal drip, cough and green mucous production since her trip to [place] (7th trip)- pollution, dust, no A/C this time. Coughs continuously throughout the day. No change from morning or night. Heat makes it worse- tries to stay inside. Has tried Mucinex DM,augmentin, z-pack, and prednisone without relief. Hx asthma, better with Advair sample of 5 days from office visit 6/28.. Reports less wheezing and sputum amount. Continued flovent 2x a day after. Hasn't needed rescus inhaler in 1 week.   Not sleeping as well, eating or drinking. Drinking 2 cokes a day and 1 cup of coffee, no additional fluids. Hx GERD-well controlled with nexium. Hx pnemonia last august after trip- RX levaquin.Chronic back pain- no change of fentanyl. No SOB, fevers, chills, weakness, syncope or chest pain.     Review of Systems  Constitutional: Positive for appetite change and fatigue. Negative for chills and fever.  HENT: Positive for postnasal drip. Negative for ear pain, sinus pressure and sinus pain.   Respiratory: Positive for cough and wheezing. Negative for chest tightness and shortness of breath.   Cardiovascular: Negative for chest pain and leg swelling.  Gastrointestinal: Negative for constipation, diarrhea, nausea and vomiting.  Genitourinary: Negative for dysuria.  Musculoskeletal: Positive for back pain. Negative for arthralgias.  Neurological: Negative for dizziness, light-headedness and headaches.  Psychiatric/Behavioral: Negative.   chronic, well  controlled with fentanyl patch       Objective:   Physical Exam  Constitutional: She is oriented to person, place, and time. She appears well-developed. No distress.  HENT:  Head: Normocephalic.  Right Ear: Hearing, tympanic membrane, external ear and ear canal normal.  Left Ear: Hearing, tympanic membrane, external ear and ear canal normal.  Nose: No mucosal edema. Right sinus exhibits no maxillary sinus tenderness and no frontal sinus tenderness. Left sinus exhibits no maxillary sinus tenderness and no frontal sinus tenderness.  Mouth/Throat: Uvula is midline and oropharynx is clear and moist.  Mild clear stringy d/c in R nare  Eyes: Pupils are equal, round, and reactive to light. EOM and lids are normal.  L eye brown, R eye blue   Neck: No thyromegaly present.  Cardiovascular: Normal rate, regular rhythm, S1 normal and S2 normal.  Pulmonary/Chest: No stridor. No tachypnea. No respiratory distress. She has no decreased breath sounds. She has no wheezes. She has no rhonchi. She has no rales.  Musculoskeletal: Normal range of motion.  Lymphadenopathy:       Head (right side): No submental, no submandibular, no tonsillar, no preauricular and no posterior auricular adenopathy present.       Head (left side): No submental, no submandibular, no tonsillar, no preauricular and no posterior auricular adenopathy present.  Neurological: She is alert and oriented to person, place, and time.  Skin: Skin is warm, dry and intact.  Psychiatric: She has a normal mood and affect. Her speech is normal and behavior is normal.  Assessment:     Cough Fatigue, unspecified Asthma in remission    Plan:     Go to [facility] [phone] or [address]) for chest x-ray.  We will get back to you later today with your results. If there is evidence of infection, we will send an antibiotic to your pharmacy. If not, then let's work on adequate hydration (at least 6-8 glasses of non-caffeinated  beverage per day), as well as nasal saline spray and sinus rinses twice daily. Stay in touch and let me know if the green improves with this, versus whether we need to do imaging of the sinuses, or if we want once last course of antibiotics before pursuing imaging.  It is very important that you eat and drink properly in order to maintain your energy, especially in this heat.  Patient states that she has used Flonase and it caused nasal irritation. Proper technique reviewed. Patient encouraged to continue to try nasal steroids for relief.

## 2017-09-01 NOTE — Patient Instructions (Signed)
Go to [facility] [phone] or [address] for chest x-ray.  We will get back to you later today with your results. If there is evidence of infection, we will send an antibiotic to your pharmacy. If not, then let's work on adequate hydration (at least 6-8 glasses of non-caffeinated beverage per day), as well as nasal saline spray and sinus rinses twice daily. Stay in touch and let me know if the green improves with this, versus whether we need to do imaging of the sinuses, or if we want once last course of antibiotics before pursuing imaging.  It is very important that you eat and drink properly in order to maintain your energy, especially in this heat.

## 2017-09-17 MED FILL — METHOCARBAMOL 500 MG TABLET: 500 | 60 days supply | Qty: 180 | Fill #0

## 2017-09-21 ENCOUNTER — Encounter: Payer: Self-pay | Admitting: Family Medicine

## 2017-09-21 ENCOUNTER — Other Ambulatory Visit: Payer: Self-pay | Admitting: Family Medicine

## 2017-09-21 MED ORDER — AZELASTINE HCL 0.1 % NA SOLN: 2.0000 | Freq: Two times a day (BID) | NASAL | 2 refills | Status: DC

## 2017-09-21 MED FILL — MONTELUKAST SOD 10 MG TAB: 10 | 90 days supply | Qty: 90 | Fill #0

## 2017-09-21 MED FILL — AZELASTINE HCL 137 MCG/SPRA: 137 | 30 days supply | Qty: 30 | Fill #0

## 2017-09-21 NOTE — Telephone Encounter (Signed)
Ok to refill 

## 2017-10-21 MED FILL — GABAPENTIN 800 MG TABLET: 800 | 90 days supply | Qty: 90 | Fill #2

## 2017-10-22 MED FILL — FLOVENT HFA 220 MCG INHALER: 220 | 90 days supply | Qty: 36 | Fill #1

## 2017-10-29 MED FILL — CELECOXIB 200 MG CAPSULE: 200 | 90 days supply | Qty: 180 | Fill #1

## 2017-11-01 MED FILL — OMEGA-3 ETHYL ESTERS 1 GM C: 1 | 90 days supply | Qty: 360 | Fill #1

## 2017-11-04 MED FILL — AZELASTINE HCL 137 MCG/SPRA: 137 | 30 days supply | Qty: 30 | Fill #1

## 2017-11-11 MED FILL — ESTRADIOL 0.5 MG TABLET: 0.5 | 90 days supply | Qty: 90 | Fill #3

## 2017-11-13 IMAGING — NM NM MISC PROCEDURE
3 series · 18 of 18 positions shown · non-contrast
Comparison: none

[Series 1: stress-sum-em_(id)_sa · 6.4mm · 6.40mm/px · 6 of 64 frames shown]
[frame 6/64]
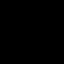
[frame 16/64]
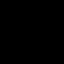
[frame 27/64]
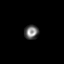
[frame 38/64]
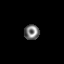
[frame 48/64]
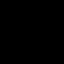
[frame 59/64]
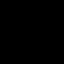

[Series 1: rest_(id)_sa · 6.4mm · 6.40mm/px · 6 of 64 frames shown]
[frame 6/64]
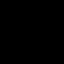
[frame 16/64]
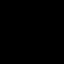
[frame 27/64]
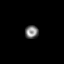
[frame 38/64]
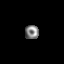
[frame 48/64]
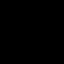
[frame 59/64]
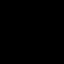

[Series 1: stress-gsp_(id)_sa · 6.4mm · 6.40mm/px · 6 of 512 frames shown]
[frame 43/512]
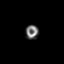
[frame 128/512]
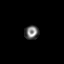
[frame 214/512]
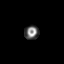
[frame 299/512]
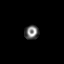
[frame 384/512]
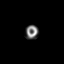
[frame 470/512]
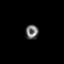

[18 of 18 positions shown; findings below may reference images not displayed]

Canned report from images found in remote index.

Refer to host system for actual result text.

## 2017-11-18 DIAGNOSIS — H35033 Hypertensive retinopathy, bilateral: Secondary | ICD-10-CM

## 2017-11-18 HISTORY — DX: Hypertensive retinopathy, bilateral: H35.033

## 2017-11-25 ENCOUNTER — Encounter: Payer: Self-pay | Admitting: *Deleted

## 2017-11-29 ENCOUNTER — Other Ambulatory Visit: Payer: Self-pay | Admitting: Family Medicine

## 2017-11-29 MED FILL — ESOMEPRAZOLE MAG DR 40 MG C: 40 | 30 days supply | Qty: 30 | Fill #0

## 2017-11-29 MED FILL — ATORVASTATIN 40 MG TABLET: 40 | 30 days supply | Qty: 30 | Fill #0

## 2017-12-03 ENCOUNTER — Encounter: Payer: Self-pay | Admitting: Family Medicine

## 2017-12-08 NOTE — Progress Notes (Signed)
Chief Complaint  Patient presents with  . Gastroesophageal Reflux    nonfasting med check. Had mammogram this morning. Abstracted flu vaccine. Has notices lately when she lis laying down or in her recliner that she has to breathe faster, she feels like it is related to her heart, she checks her pulse and feels like it is irregular at that time. Has a "pouch" on left side of her perineum that she thinks is full of urine. Ribs B/L are sore when she presses on them.  Marland Kitchen other    having eyelid surgery Dec 4th-just wanted to let you know.    She is complaining of feeling a collection of urine on the left side of the perineum. She doesn't feel like there is a fistula, but feels a fluid collection, and is able to press and get urine out.  She denies discomfort, pain, odor to the urine, or any urinary symptoms (no dysuria, urgency, frequency).  She denies abnormal vaginal discharge or other pelvic complaints.  Asthma and allergies:Was last here in July, had been sick since returning from [place] in June. Completed 2 antibiotic courses.  She uses Flovent and Singulair regularly without side effects. Hasn't needed any albuterol in a while, since July.  She still periodically coughs, more than she used to.  Only occasionally gets up green phlegm, usually white. She does a lot of throat-clearing and loses her voice a lot.  She stays on zyrtec or claritin (switches back and forth every 90d, currently taking claritin), and singulair year-round. She used to take Flonase daily, but when Astelin was added in August, she inadvertantly stopped that. She doesn't like the taste of the Astelin, but tolerates it better if she holds her head upside down.  Lately (3-4 months) she notices that when she lays flat she is breathing a little faster.  Feels like she needs to take deeper breaths.  She checks her pulse and notices that it is irregular--frequent pauses. Unaware of this occurring any other time, just when laying flat.   Walks regularly, no issues with tachycardia, palpitations, dyspnea on exertion.  She tells me that she has bradycardia and may eventually need a pacemaker. She reports her pulse at night dips into the 40's, is 50-60 during the day. She saw Dr. Johnsie Cancel last year, had normal stress test.  Hyperlipidemia follow-up: Patient is reportedly following a low-fat, low cholesterol diet. Compliant with atorvastatin and denies any side effects. She is taking Lovaza--tolerating it much better than fish oil (which caused gas).Lipids were at goal on last check. Lab Results  Component Value Date   CHOL 146 06/10/2017   HDL 47 06/10/2017   LDLCALC 72 06/10/2017   TRIG 135 06/10/2017   CHOLHDL 3.1 06/10/2017   Postmenopausal symptoms--significantly improved since starting Estrace. Last mammogram was 11/2016--had another one this morning.  GERD: Doing well on Nexium. She has recurrent heartburn if she misses a pill, or if she eats too late at night (rarely happens). Denies dysphagia.  Depression: Doing well on Paxil 30mg , withoutside effects. This is prescribed by Dr. Hardin Negus.   PMH, PSH, SH reviewed  Outpatient Encounter Medications as of 12/09/2017  Medication Sig Note  . aspirin EC 81 MG tablet Take 81 mg by mouth daily.   Marland Kitchen atorvastatin (LIPITOR) 40 MG tablet Take 1 tablet (40 mg total) by mouth daily.   Marland Kitchen azelastine (ASTELIN) 0.1 % nasal spray Place 2 sprays into both nostrils 2 (two) times daily. Use in each nostril as directed   .  b complex vitamins tablet Take 1 tablet by mouth daily.   . beclomethasone (QVAR) 80 MCG/ACT inhaler Inhale into the lungs.   . Calcium Carbonate-Vitamin D (CALCIUM 600+D) 600-200 MG-UNIT TABS Take 1 tablet by mouth 2 (two) times daily.     . celecoxib (CELEBREX) 200 MG capsule Take 1 capsule (200 mg total) by mouth 2 (two) times daily.   Marland Kitchen esomeprazole (NEXIUM) 40 MG capsule TAKE 1 CAPSULE BY MOUTH DAILY BEFORE SUPPER.   Marland Kitchen estradiol (ESTRACE) 0.5 MG tablet  TAKE 1 TABLET (0.5 MG TOTAL) BY MOUTH DAILY.   . fentaNYL (DURAGESIC - DOSED MCG/HR) 25 MCG/HR Place 1 patch onto the skin every 3 (three) days.     . fluticasone (FLOVENT HFA) 220 MCG/ACT inhaler INHALE 2 PUFFS INTO THE LUNGS 2 (TWO) TIMES DAILY.   Marland Kitchen gabapentin (NEURONTIN) 800 MG tablet Take 800 mg by mouth at bedtime.    Marland Kitchen loratadine (CLARITIN) 10 MG tablet Take 10 mg by mouth daily.   . methocarbamol (ROBAXIN) 500 MG tablet Take 500 mg by mouth daily with breakfast.   . montelukast (SINGULAIR) 10 MG tablet TAKE 1 TABLET BY MOUTH AT BEDTIME.   . Multiple Vitamins-Minerals (MULTIVITAMIN WITH MINERALS) tablet Take 1 tablet by mouth daily.   Marland Kitchen omega-3 acid ethyl esters (LOVAZA) 1 g capsule TAKE 2 CAPSULES BY MOUTH 2 TIMES DAILY.   Marland Kitchen PARoxetine (PAXIL) 30 MG tablet Take 30 mg by mouth daily.    . [DISCONTINUED] atorvastatin (LIPITOR) 40 MG tablet TAKE 1 TABLET BY MOUTH DAILY.   . [DISCONTINUED] celecoxib (CELEBREX) 200 MG capsule Take 1 capsule (200 mg total) by mouth 2 (two) times daily.   . [DISCONTINUED] esomeprazole (NEXIUM) 40 MG capsule TAKE 1 CAPSULE BY MOUTH DAILY BEFORE SUPPER.   Marland Kitchen acetaminophen (TYLENOL) 500 MG tablet Take 1,000 mg by mouth as needed. Reported on 05/15/2015   . albuterol (PROVENTIL HFA;VENTOLIN HFA) 108 (90 Base) MCG/ACT inhaler Inhale 2 puffs into the lungs every 6 (six) hours as needed for wheezing or shortness of breath. (Patient not taking: Reported on 09/01/2017)   . cetirizine (ZYRTEC) 10 MG tablet Take 10 mg by mouth daily.     Marland Kitchen Dextromethorphan-guaiFENesin (MUCINEX DM PO) Take 1 tablet by mouth every 6 (six) hours.   . Fluorouracil (TOLAK) 4 % CREA Apply topically every 3 (three) days.   . fluticasone (FLONASE) 50 MCG/ACT nasal spray PLACE 2 SPRAYS INTO THE NOSE DAILY. (Patient not taking: Reported on 12/09/2017)   . loperamide (IMODIUM A-D) 2 MG tablet Take 2 mg by mouth 4 (four) times daily as needed for diarrhea or loose stools. 06/10/2017: Uses prn diarrhea from  IBS  . pseudoephedrine-guaifenesin (MUCINEX D) 60-600 MG 12 hr tablet     No facility-administered encounter medications on file as of 12/09/2017.    Allergies  Allergen Reactions  . Adhesive [Tape] Rash  . Codeine Rash   ROS: no fever, chills, URI symptoms. Some residual PND, hoarseness, cough, allergies as per HPI.  No chest pain, shortness of breath. Notes faster breathing when laying flat, with some irregularity to pulse noted, per HPI.  No edema, bleeding, bruising, rashes.  +urinary "collection" per HPI.  Moods are good. See HPI   PHYSICAL EXAM:  BP 130/70   Pulse 68   Ht 5\' 5"  (1.651 m)   Wt 155 lb 3.2 oz (70.4 kg)   BMI 25.83 kg/m   Wt Readings from Last 3 Encounters:  12/09/17 155 lb 3.2 oz (70.4 kg)  09/01/17  160 lb 12.8 oz (72.9 kg)  08/13/17 160 lb 6.4 oz (72.8 kg)    Well appearing, pleasant female, in no distress HEENT: conjunctiva and sclera are clear, EOMI. Nasal mucosa is mildly edematous with white mucus on the left.  No erythema. Sinuses are nontender OP is clear. TM's and EAC's normal Neck: no lymphadenopathy, or mass, no bruit Heart: regular rhythm, no murmur, rub or gallop. Mild bradycardia.  Some change in rate with breathing noted (sinus arrhythmia). When initially was supine, increased pauses noted (initially sounded like bigeminy, but went back to baseline of bradycardia with sinus arrhythmia).  Lungs: good air movement. No wheezes, rales, ronchi Abdomen: soft, nontender, no organomegaly or mass Back: no spinal or CVA tenderness Extremities: no edema GU: Normal external genitalia without lesions.  No swelling/pouch or collection of urine noted. Urethra is normal.  There is some white vaginal discharge noted externally.  There is also slight wetness noted upon exam--used gauze to see if this was urine, but fluid was clear, not yellow.  Skin: normal turgor, no rash Psych: normal mood, affect, hygiene and grooming Neuro: alert and oriented, cranial  nerves intact, normal gait.   EKG and rhythm strip--sinus bradycardia, rate 55, 1st degree AVB noted. No acute changes. Sinus arrhythmia noted.   ASSESSMENT/PLAN:  Mixed hyperlipidemia - at goal per last check - Plan: atorvastatin (LIPITOR) 40 MG tablet  Gastroesophageal reflux disease, esophagitis presence not specified - controlled - Plan: esomeprazole (NEXIUM) 40 MG capsule  Medication monitoring encounter - Plan: Comprehensive metabolic panel, CBC with Differential/Platelet  Osteoarthritis of multiple joints, unspecified osteoarthritis type - Plan: celecoxib (CELEBREX) 200 MG capsule  Allergic rhinitis, unspecified seasonality, unspecified trigger - suboptimally controlled, as nasal steroid was inadvertantly stopped. Restart Flonase  Sinus bradycardia - asymptomatic. No further treatment or cardiology f/u needed at this time, unless symptomatic. Reassured  Palpitations - Plan: Comprehensive metabolic panel, TSH, EKG 67-[facility]  First degree AV block  Urinary incontinence, unspecified type - no collection or "pouch" noted on exam.  May need to see GYN or urology if ongoing concerns.   F/u 6 mos for CPE/AWV, fasting for visit (declines labs prior)    Be sure to drink plenty of water. Use Mucinex (plain or extra strength) regularly to help thin out the mucus and help with any chest congestion, cough, throat-clearing, hoarseness related to postnasal drainage. Restart the Flonase vs other nasal steroid to help with allergies. When back at baseline, consider using the Astelin only as needed.

## 2017-12-09 ENCOUNTER — Ambulatory Visit: Payer: Medicare Other | Admitting: Family Medicine

## 2017-12-09 ENCOUNTER — Encounter: Payer: Self-pay | Admitting: Family Medicine

## 2017-12-09 VITALS — BP 130/70 | HR 68 | Ht 65.0 in | Wt 155.2 lb

## 2017-12-09 DIAGNOSIS — R002 Palpitations: Secondary | ICD-10-CM

## 2017-12-09 DIAGNOSIS — Z5181 Encounter for therapeutic drug level monitoring: Secondary | ICD-10-CM | POA: Diagnosis not present

## 2017-12-09 DIAGNOSIS — R001 Bradycardia, unspecified: Secondary | ICD-10-CM | POA: Diagnosis not present

## 2017-12-09 DIAGNOSIS — R32 Unspecified urinary incontinence: Secondary | ICD-10-CM

## 2017-12-09 DIAGNOSIS — K219 Gastro-esophageal reflux disease without esophagitis: Secondary | ICD-10-CM

## 2017-12-09 DIAGNOSIS — E782 Mixed hyperlipidemia: Secondary | ICD-10-CM | POA: Diagnosis not present

## 2017-12-09 DIAGNOSIS — M159 Polyosteoarthritis, unspecified: Secondary | ICD-10-CM | POA: Diagnosis not present

## 2017-12-09 DIAGNOSIS — I440 Atrioventricular block, first degree: Secondary | ICD-10-CM

## 2017-12-09 DIAGNOSIS — J309 Allergic rhinitis, unspecified: Secondary | ICD-10-CM

## 2017-12-09 LAB — HM MAMMOGRAPHY

## 2017-12-09 MED ORDER — ESOMEPRAZOLE MAGNESIUM 40 MG PO CPDR: DELAYED_RELEASE_CAPSULE | ORAL | 1 refills | Status: DC

## 2017-12-09 MED ORDER — ATORVASTATIN CALCIUM 40 MG PO TABS: 40.0000 mg | ORAL_TABLET | Freq: Every day | ORAL | 1 refills | Status: DC

## 2017-12-09 MED ORDER — CELECOXIB 200 MG PO CAPS: 200.0000 mg | ORAL_CAPSULE | Freq: Two times a day (BID) | ORAL | 1 refills | Status: DC

## 2017-12-09 NOTE — Patient Instructions (Signed)
  Be sure to drink plenty of water. Use Mucinex (plain or extra strength) regularly to help thin out the mucus and help with any chest congestion, cough, throat-clearing, hoarseness related to postnasal drainage. Restart the Flonase vs other nasal steroid to help with allergies. When back at baseline, consider using the Astelin only as needed.  Consider elevating the head of the bed at night.

## 2017-12-10 LAB — TSH: TSH: 1.82 u[IU]/mL (ref 0.450–4.500)

## 2017-12-10 LAB — CBC WITH DIFFERENTIAL/PLATELET
BASOS: 1 %
Basophils Absolute: 0 10*3/uL (ref 0.0–0.2)
EOS (ABSOLUTE): 0.1 10*3/uL (ref 0.0–0.4)
EOS: 1 %
HEMATOCRIT: 40.8 % (ref 34.0–46.6)
HEMOGLOBIN: 13.5 g/dL (ref 11.1–15.9)
IMMATURE GRANS (ABS): 0.1 10*3/uL (ref 0.0–0.1)
Immature Granulocytes: 1 %
LYMPHS ABS: 2.1 10*3/uL (ref 0.7–3.1)
Lymphs: 28 %
MCH: 31.5 pg (ref 26.6–33.0)
MCHC: 33.1 g/dL (ref 31.5–35.7)
MCV: 95 fL (ref 79–97)
MONOCYTES: 10 %
Monocytes Absolute: 0.8 10*3/uL (ref 0.1–0.9)
NEUTROS ABS: 4.6 10*3/uL (ref 1.4–7.0)
Neutrophils: 59 %
Platelets: 181 10*3/uL (ref 150–450)
RBC: 4.28 x10E6/uL (ref 3.77–5.28)
RDW: 13.9 % (ref 12.3–15.4)
WBC: 7.6 10*3/uL (ref 3.4–10.8)

## 2017-12-10 LAB — COMPREHENSIVE METABOLIC PANEL
ALT: 20 IU/L (ref 0–32)
AST: 21 IU/L (ref 0–40)
Albumin/Globulin Ratio: 2.4 — ABNORMAL HIGH (ref 1.2–2.2)
Albumin: 4.4 g/dL (ref 3.6–4.8)
Alkaline Phosphatase: 86 IU/L (ref 39–117)
BILIRUBIN TOTAL: 0.4 mg/dL (ref 0.0–1.2)
BUN / CREAT RATIO: 20 (ref 12–28)
BUN: 14 mg/dL (ref 8–27)
CALCIUM: 9.6 mg/dL (ref 8.7–10.3)
CHLORIDE: 101 mmol/L (ref 96–106)
CO2: 25 mmol/L (ref 20–29)
Creatinine, Ser: 0.71 mg/dL (ref 0.57–1.00)
GFR, EST AFRICAN AMERICAN: 100 mL/min/{1.73_m2} (ref >59)
GFR, EST NON AFRICAN AMERICAN: 87 mL/min/{1.73_m2} (ref >59)
GLUCOSE: 84 mg/dL (ref 65–99)
Globulin, Total: 1.8 g/dL (ref 1.5–4.5)
Potassium: 4.7 mmol/L (ref 3.5–5.2)
Sodium: 141 mmol/L (ref 134–144)
TOTAL PROTEIN: 6.2 g/dL (ref 6.0–8.5)

## 2017-12-11 ENCOUNTER — Encounter: Payer: Self-pay | Admitting: Family Medicine

## 2017-12-17 MED FILL — FLUTICASONE PROP 50 MCG SPR: 50 | 90 days supply | Qty: 48 | Fill #0

## 2017-12-21 MED FILL — FLOVENT HFA 220 MCG INHALER: 220 | 30 days supply | Qty: 12 | Fill #2

## 2017-12-21 MED FILL — AZELASTINE HCL 137 MCG/SPRA: 137 | 30 days supply | Qty: 30 | Fill #2

## 2017-12-22 MED FILL — ESOMEPRAZOLE MAG DR 40 MG C: 40 | 90 days supply | Qty: 90 | Fill #0

## 2017-12-22 MED FILL — ATORVASTATIN 40 MG TABLET: 40 | 90 days supply | Qty: 90 | Fill #0

## 2018-01-16 HISTORY — PX: BLEPHAROPLASTY: SUR158

## 2018-01-17 MED FILL — GABAPENTIN 800 MG TABLET: 800 | 90 days supply | Qty: 90 | Fill #0

## 2018-01-17 MED FILL — MONTELUKAST SOD 10 MG TAB: 10 | 90 days supply | Qty: 90 | Fill #1

## 2018-02-01 ENCOUNTER — Ambulatory Visit: Payer: Medicare Other | Admitting: Family Medicine

## 2018-02-01 ENCOUNTER — Encounter: Payer: Self-pay | Admitting: Family Medicine

## 2018-02-01 ENCOUNTER — Other Ambulatory Visit: Payer: Self-pay | Admitting: Family Medicine

## 2018-02-01 VITALS — BP 124/69 | HR 61 | Ht 65.0 in | Wt 160.0 lb

## 2018-02-01 DIAGNOSIS — M25551 Pain in right hip: Secondary | ICD-10-CM

## 2018-02-01 DIAGNOSIS — M25512 Pain in left shoulder: Secondary | ICD-10-CM

## 2018-02-01 MED ORDER — METHYLPREDNISOLONE ACETATE 40 MG/ML IJ SUSP
40.0000 mg | Freq: Once | INTRAMUSCULAR | Status: AC
  Administered 2018-02-01: 40 mg via INTRA_ARTICULAR

## 2018-02-01 MED ORDER — OMEGA-3-ACID ETHYL ESTERS 1 G PO CAPS: ORAL_CAPSULE | ORAL | 1 refills | Status: DC

## 2018-02-01 MED FILL — OMEGA-3 ETHYL ESTERS 1 GM C: 1 | 90 days supply | Qty: 360 | Fill #0
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
## 2018-02-01 NOTE — Patient Instructions (Signed)
You have trochanteric bursitis Avoid painful activities as much as possible. Ice over area of pain 3-4 times a day for 15 minutes at a time Hip side raise exercise 3 sets of 10 once a day - add weights if this becomes too easy. Stretches - pick 2-3 and hold for 20-30 seconds x 3 - do once or twice a day. Tylenol and/or aleve as needed for pain. You were given an injection today. Follow up with me in 6 weeks or as needed for this.  We will go ahead with an MRI of your left shoulder to further assess.

## 2018-02-02 ENCOUNTER — Encounter: Payer: Self-pay | Admitting: Family Medicine

## 2018-02-11 MED FILL — METHOCARBAMOL 500 MG TABLET: 500 | 60 days supply | Qty: 180 | Fill #1

## 2018-02-11 MED FILL — CELECOXIB 200 MG CAP: 200 | 90 days supply | Qty: 180 | Fill #0

## 2018-02-14 ENCOUNTER — Other Ambulatory Visit: Payer: Self-pay | Admitting: Family Medicine

## 2018-02-14 DIAGNOSIS — N959 Unspecified menopausal and perimenopausal disorder: Secondary | ICD-10-CM

## 2018-02-14 MED FILL — ESTRADIOL 0.5 MG TABLET: 0.5 | 90 days supply | Qty: 90 | Fill #0

## 2018-02-14 NOTE — Telephone Encounter (Signed)
mammo UTD, done 11/2017; visit UTD

## 2018-02-14 NOTE — Telephone Encounter (Signed)
Is this okay to send

## 2018-02-16 ENCOUNTER — Encounter: Payer: Self-pay | Admitting: Family Medicine

## 2018-02-17 ENCOUNTER — Encounter: Payer: Self-pay | Admitting: Family Medicine

## 2018-02-17 ENCOUNTER — Ambulatory Visit: Payer: Medicare Other | Admitting: Family Medicine

## 2018-02-17 VITALS — BP 100/60 | HR 64 | Temp 98.6°F | Ht 64.0 in | Wt 163.6 lb

## 2018-02-17 DIAGNOSIS — R0982 Postnasal drip: Secondary | ICD-10-CM | POA: Diagnosis not present

## 2018-02-17 DIAGNOSIS — J3489 Other specified disorders of nose and nasal sinuses: Secondary | ICD-10-CM | POA: Diagnosis not present

## 2018-02-17 NOTE — Progress Notes (Signed)
Chief Complaint  Patient presents with  . Facial Pain    and pressure, more of left than right. Lots of drainage-mucus is discolored sometimes. Coughs up the mucus, mostly. Some hoarseness. No fevers, chills or body aches. Says she is really tired, always fatigued. Feels drained from this.     She stated taking Astelin and Flonase after her October visit.  She doesn't notice any improvement in her symptoms.  She uses guaifenesin DM (short-acting) 2-3 times/day, might help some. She has persistent sinus pressure, drainage (from the nose and postnasal, mainly postnasal) and cough.  Some intermittent hoarseness.  Nasal drainage is clear.  If she is able to expectorate phlegm, it is usually clear, some color in the mornings only.   Pain is in both cheeks, L>R.  Has not done sinus rinses (and doesn't want to). Not currently taking decongestants.  Asthma is doing well, not needing any albuterol.   PMH, PSH, SH reviewed  Outpatient Encounter Medications as of 02/17/2018  Medication Sig Note  . aspirin EC 81 MG tablet Take 81 mg by mouth daily.   Marland Kitchen atorvastatin (LIPITOR) 40 MG tablet Take 1 tablet (40 mg total) by mouth daily.   Marland Kitchen azelastine (ASTELIN) 0.1 % nasal spray Place 2 sprays into both nostrils 2 (two) times daily. Use in each nostril as directed   . Calcium Carbonate-Vitamin D (CALCIUM 600+D) 600-200 MG-UNIT TABS Take 1 tablet by mouth 2 (two) times daily.     . celecoxib (CELEBREX) 200 MG capsule Take 1 capsule (200 mg total) by mouth 2 (two) times daily.   Marland Kitchen esomeprazole (NEXIUM) 40 MG capsule TAKE 1 CAPSULE BY MOUTH DAILY BEFORE SUPPER.   Marland Kitchen estradiol (ESTRACE) 0.5 MG tablet TAKE 1 TABLET BY MOUTH ONCE DAILY   . fentaNYL (DURAGESIC - DOSED MCG/HR) 25 MCG/HR Place 1 patch onto the skin every 3 (three) days.     . fluticasone (FLONASE) 50 MCG/ACT nasal spray PLACE 2 SPRAYS INTO THE NOSE DAILY.   . fluticasone (FLOVENT HFA) 220 MCG/ACT inhaler INHALE 2 PUFFS INTO THE LUNGS 2 (TWO) TIMES  DAILY.   Marland Kitchen gabapentin (NEURONTIN) 800 MG tablet Take 800 mg by mouth at bedtime.    Marland Kitchen loratadine (CLARITIN) 10 MG tablet Take 10 mg by mouth daily.   . methocarbamol (ROBAXIN) 500 MG tablet Take 500 mg by mouth daily with breakfast.   . montelukast (SINGULAIR) 10 MG tablet TAKE 1 TABLET BY MOUTH AT BEDTIME.   . Multiple Vitamins-Minerals (MULTIVITAMIN WITH MINERALS) tablet Take 1 tablet by mouth daily.   Marland Kitchen omega-3 acid ethyl esters (LOVAZA) 1 g capsule TAKE 2 CAPSULES BY MOUTH 2 TIMES DAILY.   Marland Kitchen PARoxetine (PAXIL) 30 MG tablet Take 30 mg by mouth daily.    . [DISCONTINUED] b complex vitamins tablet Take 1 tablet by mouth daily.   . [DISCONTINUED] beclomethasone (QVAR) 80 MCG/ACT inhaler Inhale into the lungs.   Marland Kitchen acetaminophen (TYLENOL) 500 MG tablet Take 1,000 mg by mouth as needed. Reported on 05/15/2015   . albuterol (PROVENTIL HFA;VENTOLIN HFA) 108 (90 Base) MCG/ACT inhaler Inhale 2 puffs into the lungs every 6 (six) hours as needed for wheezing or shortness of breath. (Patient not taking: Reported on 02/17/2018)   . cetirizine (ZYRTEC) 10 MG tablet Take 10 mg by mouth daily.     . Fluorouracil (TOLAK) 4 % CREA Apply topically every 3 (three) days.   Marland Kitchen loperamide (IMODIUM A-D) 2 MG tablet Take 2 mg by mouth 4 (four) times daily as  needed for diarrhea or loose stools. 06/10/2017: Uses prn diarrhea from IBS   No facility-administered encounter medications on file as of 02/17/2018.    ROS:  No fever, chills. No nausea, vomiting, diarrhea, bleeding, bruising, rashes, urinary complaints.  Breathing is good, no wheezing. Moods are good, pain is controlled.   PHYSICAL EXAM:  BP 100/60   Pulse 64   Temp 98.6 F (37 C) (Tympanic)   Ht 5\' 4"  (1.626 m)   Wt 163 lb 9.6 oz (74.2 kg)   BMI 28.08 kg/m   Well appearing, pleasant female, in good spirits, in no distress HEENT: conjunctiva and sclera are clear,  EOMI. TM's and EAC's normal. Nasal mucosa notable for mod edema with clear mucus, R>L.  Sinuses are nontender. OP is clear Neck: no lymphadenopathy or mass Heart: regular rate and rhythm, no murmur Lungs: clear bilaterally, no wheezes, rales, ronchi Neuro: alert and oriented, cranial nerves intact, normal gait Psych: normal mood, affect, hygiene and grooming Skin: normal turgor, no rash   ASSESSMENT/PLAN:  Sinus pressure - chronic, without e/o of clinical infection, not responding to supportive measures. Check CT  Postnasal drip - not responding to medications; check CT to see if needs ENT vs allergy referral (vs ABX) - Plan: CT Maxillofacial WO CM   Continue your current medications (antihistamine, singulair, mucinex--try and use 24 hours/day, consider a 12 hour form to make it easier, Astelin and Flonase). Add Sudafed to help with sinus pressure (your choice of which type, a 24 hour can be in combination with your antihistamine, vs getting separately, especially if it turns out to cause insomnia for you).  Try doing sinus rinses once or twice daily as we discussed.  We will send you for a CT of the sinuses.  If it is completely clear, next step would be allergist, otherwise we will refer you to ENT if not improving with these additional measures.

## 2018-02-17 NOTE — Patient Instructions (Signed)
  Continue your current medications (antihistamine, singulair, mucinex--try and use 24 hours/day, consider a 12 hour form to make it easier, Astelin and Flonase). Add Sudafed to help with sinus pressure (your choice of which type, a 24 hour can be in combination with your antihistamine, vs getting separately, especially if it turns out to cause insomnia for you).  Try doing sinus rinses once or twice daily as we discussed.  We will send you for a CT of the sinuses.  If it is completely clear, next step would be allergist, otherwise we will refer you to ENT if not improving with these additional measures.

## 2018-02-18 ENCOUNTER — Other Ambulatory Visit: Payer: Self-pay

## 2018-02-18 ENCOUNTER — Ambulatory Visit
Admission: RE | Admit: 2018-02-18 | Discharge: 2018-02-18 | Disposition: A | Discharge status: Home / Self Care | Payer: Medicare Other | Source: Ambulatory Visit | Attending: Family Medicine | Admitting: Family Medicine

## 2018-02-18 DIAGNOSIS — R0982 Postnasal drip: Secondary | ICD-10-CM

## 2018-02-18 DIAGNOSIS — M25512 Pain in left shoulder: Secondary | ICD-10-CM

## 2018-02-22 ENCOUNTER — Telehealth: Payer: Self-pay | Admitting: Family Medicine

## 2018-02-22 DIAGNOSIS — M75102 Unspecified rotator cuff tear or rupture of left shoulder, not specified as traumatic: Secondary | ICD-10-CM | POA: Insufficient documentation

## 2018-02-22 NOTE — Telephone Encounter (Signed)
   Pt having surgery for rotator cuff Needs clearance form completed and faxed back to scheduler, Orson Slick at [facility] [phone] (fax)  Form sent back in folder

## 2018-02-23 ENCOUNTER — Encounter: Payer: Self-pay | Admitting: Family Medicine

## 2018-02-24 ENCOUNTER — Encounter: Payer: Self-pay | Admitting: Family Medicine

## 2018-02-24 NOTE — Telephone Encounter (Signed)
Cleared, FFO

## 2018-03-14 HISTORY — PX: SHOULDER SURGERY: SHX246

## 2018-04-01 MED FILL — ATORVASTATIN 40 MG TABLET: 40 | 90 days supply | Qty: 90 | Fill #1

## 2018-04-01 MED FILL — FLUTICASONE PROP 50 MCG SPR: 50 | 90 days supply | Qty: 48 | Fill #1

## 2018-04-01 MED FILL — FLOVENT HFA 220 MCG INHALER: 220 | 90 days supply | Qty: 36 | Fill #3

## 2018-04-01 MED FILL — ESOMEPRAZOLE MAG DR 40 MG C: 40 | 90 days supply | Qty: 90 | Fill #1

## 2018-05-02 MED FILL — MONTELUKAST SOD 10 MG TAB: 10 | 90 days supply | Qty: 90 | Fill #2

## 2018-05-02 MED FILL — METHOCARBAMOL 500 MG TABLET: 500 | 60 days supply | Qty: 180 | Fill #2

## 2018-05-02 MED FILL — GABAPENTIN 800 MG TABS: 800 | 90 days supply | Qty: 90 | Fill #1

## 2018-05-02 MED FILL — ESTRADIOL 0.5 MG TABS: 0.5 | 90 days supply | Qty: 90 | Fill #1

## 2018-05-08 ENCOUNTER — Telehealth: Payer: Self-pay | Admitting: Family Medicine

## 2018-05-08 NOTE — Telephone Encounter (Signed)
P.A. Isaiah Blakes

## 2018-05-16 MED FILL — OMEGA-3 ETHYL ESTERS 1 GM C: 1 | 90 days supply | Qty: 360 | Fill #1

## 2018-05-23 NOTE — Telephone Encounter (Signed)
P.A. approved til 02/16/19, faxed pharmacy,  Pt informed

## 2018-05-26 MED FILL — CELECOXIB 200 MG CAPSULE: 200 | 90 days supply | Qty: 180 | Fill #1

## 2018-05-31 ENCOUNTER — Other Ambulatory Visit: Payer: Self-pay | Admitting: Family Medicine

## 2018-05-31 MED FILL — AZELASTINE HCL 137 MCG SPRY: 0.1 | 25 days supply | Qty: 30 | Fill #0

## 2018-06-28 ENCOUNTER — Other Ambulatory Visit: Payer: Self-pay | Admitting: Family Medicine

## 2018-06-28 DIAGNOSIS — E782 Mixed hyperlipidemia: Secondary | ICD-10-CM

## 2018-06-28 DIAGNOSIS — Z5181 Encounter for therapeutic drug level monitoring: Secondary | ICD-10-CM

## 2018-07-04 ENCOUNTER — Ambulatory Visit: Payer: Medicare Other | Admitting: Family Medicine

## 2018-07-05 DIAGNOSIS — C4499 Other specified malignant neoplasm of skin, unspecified: Secondary | ICD-10-CM

## 2018-07-05 DIAGNOSIS — D099 Carcinoma in situ, unspecified: Secondary | ICD-10-CM

## 2018-07-05 HISTORY — DX: Other specified malignant neoplasm of skin, unspecified: C44.99

## 2018-07-05 HISTORY — DX: Carcinoma in situ, unspecified: D09.9

## 2018-07-07 ENCOUNTER — Encounter: Payer: Self-pay | Admitting: Family Medicine

## 2018-07-07 DIAGNOSIS — K219 Gastro-esophageal reflux disease without esophagitis: Secondary | ICD-10-CM

## 2018-07-08 MED ORDER — ESOMEPRAZOLE MAGNESIUM 40 MG PO CPDR: DELAYED_RELEASE_CAPSULE | ORAL | 1 refills | Status: DC

## 2018-07-08 MED FILL — ESOMEPRAZOLE MAG DR 40 MG C: 40 | 90 days supply | Qty: 90 | Fill #0

## 2018-07-10 ENCOUNTER — Encounter: Payer: Self-pay | Admitting: Family Medicine

## 2018-07-10 NOTE — Progress Notes (Signed)
Start time: 8:43 End time: 9:37  Virtual Visit via Video Note  I connected with TIENNA BIENKOWSKI on 07/13/2018 by a video enabled telemedicine application and verified that I am speaking with the correct person using two identifiers.  Location: Patient: home, on her porch Provider: home office   I discussed the limitations of evaluation and management by telemedicine and the availability of in person appointments. The patient expressed understanding and agreed to proceed. She consents to our filing her insurance for this visit.  History of Present Illness:  Chief Complaint  Patient presents with  . Medicare Wellness    VIRTUAL AWV/med check, no new concerns.     Patient presents for Medicare Annual Wellness Visit and follow-up on chronic medical issues. She had labs done prior to her visit, see below.  Asthma and allergies:She uses Flovent and Singulair regularly without side effects.Hasn't needed any albuterol in a while.  She stays on zyrtec or claritin (switches back and forth every 90d, currently taking claritin), and singulair year-round. She also uses Flonase daily, as well as Astelin. When she was last seen in January with ongoing sinus complaints, she had a sinus CT which was normal, and started taking long-acting sudafed and Mucinex BID, and got significantly better. She had indigestion when taking 12 hour sudafed, but able to take the short-acting sudafed 2-3 times daily. She is doing very well on this current regimen.  She still periodically coughs during the day, more in the afternoon, productive of yellow-gold phlegm, but much less than in the past. She no longer loses her voice as often.  Hyperlipidemia follow-up: Patient is reportedly following a low-fat, low cholesterol diet. Admits to some ice cream recently. Compliant with atorvastatin and denies any side effects. She is taking Lovaza--tolerating it much better than fish oil (which caused gas).Lipids were at  goal on last check.  Had labs done prior to visit, see below.  Postmenopausal symptoms--significantly improved since starting Estrace. She never tried cutting back (as previously recommended to try, when the weather was cooler).  Last mammogram was 11/2017.  GERD: Doing well on Nexium. She has recurrent heartburn if she misses a pill, or if she eats out in a restaurant (usually later at night, infrequent). Denies dysphagia.  Depression: Doing well on Paxil 30mg , withoutside effects. This is prescribed by Dr. Hardin Negus.   OSA: wear appliance from Dr. Toy Cookey. Feels refreshed in the mornings. She takes a nap during the day ("not much else to do"). Denies feeling sleepy while reading, driving.  She was sent to cardiology 2 years ago after an episode of chest pain. She saw the NP, had stress myovue test which was low risk.  She saw Dr. Johnsie Cancel in follow-up in 09/2016. She has had no further chest pain. She has sinus bradycardia with 1st degree AV block and incomplete RBBB. They had briefly discussed possibility of PPM in future but currently asymptomatic. Yearly ECGrecommended. In October she had reported noting that when she lays flat she is breathing a little faster (had noticed x 3-4 months).  Felt like she needed to take deeper breaths, and noted pauses in her pulse. Walks regularly, no issues with tachycardia, palpitations, dyspnea on exertion,only notices this when flat. She reports this has gotten a lot better, only rarely notices this now.  06/2016:  Nuclear stress EF: 69%.  The study is normal.  This is a low risk study.  Blood pressure demonstrated a normal response to exercise.  There was no ST segment deviation  noted during stress.  No T wave inversion was noted during stress.  Low risk stress nuclear study with normal perfusion and normal left ventricular regional and global systolic function.  Chronic pain:  Doing well on current regimen with Dr. Hardin Negus.  She had  rotator cuff surgery on the left in January, doing very well.     Immunization History  Administered Date(s) Administered  . Hepatitis A 06/17/2006, 01/05/2007  . IPV 04/15/2012  . Influenza Split 10/31/2013  . Influenza, High Dose Seasonal PF 11/16/2016, 11/16/2017  . Influenza-Unspecified 11/09/2014, 11/14/2015  . Pneumococcal Conjugate-13 07/20/2013  . Pneumococcal Polysaccharide-23 06/17/2006, 11/26/2014  . Tdap 09/16/2004, 10/08/2014  . Typhoid Live 06/17/2011, 06/10/2017  . Yellow Fever 07/08/2012  . Zoster 04/04/2009  . Zoster Recombinat (Shingrix) 04/28/2016, 08/15/2016   Last Pap smear: 1997 (s/p hysterectomy)  Last mammogram: 11/2017 Last colonoscopy: 01/2012, Dr. Rich Fuchs diverticulosis; repeat in 10 years Last DEXA: 11/2013 T-1.2 (signific decline over 11 years, [facility] rec recheck 2 yrs, I feel can be longer) Dentist: twice yearly Ophtho: yearly Exercise:walks 45 minutes 3x/week. No regular weight-bearing exercise. Has 2# weights at home.  Continues to garden.   Other doctors caring for patient include: Dr. Nicholaus Bloom (pain) Dr. Toy Cookey (dentist) Dr. Sue Lush (endodontist) Dr. Satira Sark (periodontist) Dr. Denna Haggard (derm) Dr. Merri Ray (plastics at [facility]) Dr. Kathlen Mody (at [facility] ophtho) Dr. Janne Lab at [facility]--neuro-ophtho Dr. Magda Kiel (ophtho at [facility] for blepharoplasty) Dr. Tonita Cong (ortho) Dr. Deatra Ina (GI) Dr. Johnsie Cancel (cardiology)  Depression screen: negative Fall screen: negative Functional Status survey: unremarkable Mini-Cog screen: normal (clock visualized over video) See full screens in epic   End of Life Discussion: Patient hasa living will and medical power of attorney  PMH, PSH, SH and FH were reviewed/updated.  Outpatient Encounter Medications as of 07/13/2018  Medication Sig Note  . aspirin EC 81 MG tablet Take 81 mg by mouth daily.   Marland Kitchen atorvastatin (LIPITOR) 40 MG tablet Take 1 tablet (40 mg total) by mouth daily.   . Azelastine  HCl 137 MCG/SPRAY SOLN PLACE 2 SPRAYS INTO BOTH NOSTRILS 2 TIMES DAILY. USE IN EACH NOSTRIL AS DIRECTED   . Calcium Carbonate-Vitamin D (CALCIUM 600+D) 600-200 MG-UNIT TABS Take 1 tablet by mouth 2 (two) times daily.     . celecoxib (CELEBREX) 200 MG capsule Take 1 capsule (200 mg total) by mouth 2 (two) times daily.   . cetirizine (ZYRTEC) 10 MG tablet Take 10 mg by mouth daily.     Marland Kitchen Dextromethorphan-guaiFENesin (GUAIFENESIN DM PO) Take by mouth. 02/17/2018: Takes it 2-3 times/d  . esomeprazole (NEXIUM) 40 MG capsule TAKE 1 CAPSULE BY MOUTH DAILY BEFORE SUPPER.   Marland Kitchen estradiol (ESTRACE) 0.5 MG tablet TAKE 1 TABLET BY MOUTH ONCE DAILY   . fentaNYL (DURAGESIC - DOSED MCG/HR) 25 MCG/HR Place 1 patch onto the skin every 3 (three) days.     . fluticasone (FLONASE) 50 MCG/ACT nasal spray PLACE 2 SPRAYS INTO THE NOSE DAILY.   . fluticasone (FLOVENT HFA) 220 MCG/ACT inhaler INHALE 2 PUFFS INTO THE LUNGS 2 (TWO) TIMES DAILY.   Marland Kitchen gabapentin (NEURONTIN) 800 MG tablet Take 800 mg by mouth at bedtime.    . methocarbamol (ROBAXIN) 500 MG tablet Take 500 mg by mouth daily with breakfast.   . montelukast (SINGULAIR) 10 MG tablet TAKE 1 TABLET BY MOUTH AT BEDTIME.   . Multiple Vitamins-Minerals (MULTIVITAMIN WITH MINERALS) tablet Take 1 tablet by mouth daily.   Marland Kitchen omega-3 acid ethyl esters (LOVAZA) 1 g capsule TAKE 2 CAPSULES BY  MOUTH 2 TIMES DAILY.   Marland Kitchen PARoxetine (PAXIL) 30 MG tablet Take 30 mg by mouth daily.    Marland Kitchen acetaminophen (TYLENOL) 500 MG tablet Take 1,000 mg by mouth as needed. Reported on 05/15/2015   . albuterol (PROVENTIL HFA;VENTOLIN HFA) 108 (90 Base) MCG/ACT inhaler Inhale 2 puffs into the lungs every 6 (six) hours as needed for wheezing or shortness of breath. (Patient not taking: Reported on 02/17/2018)   . Fluorouracil (TOLAK) 4 % CREA Apply topically every 3 (three) days.   Marland Kitchen loperamide (IMODIUM A-D) 2 MG tablet Take 2 mg by mouth 4 (four) times daily as needed for diarrhea or loose stools. 06/10/2017:  Uses prn diarrhea from IBS  . [DISCONTINUED] loratadine (CLARITIN) 10 MG tablet Take 10 mg by mouth daily.    No facility-administered encounter medications on file as of 07/13/2018.     ROS: The patient denies anorexia, fever, headaches, vision changes, decreased hearing, ear pain, sore throat, breast concerns, chest pain, palpitations, dizziness, syncope, dyspnea on exertion, swelling, nausea, vomiting, abdominal pain, melena, hematochezia, indigestion/heartburn (Infrequent), hematuria, dysuria, vaginal bleeding, discharge, odor or itch, genital lesions, numbness, tingling, weakness, tremor, suspicious skin lesions, depression, anxiety, abnormal bleeding/bruising, or enlarged lymph nodes.  +chronic back pain. Pain is well controlled. She has morning hand stiffness, and hip stiffness after sitting. Some trouble sleeping at night related to some pain in back with position changes. Gets back to sleep easily. Very slight stress urinary incontinence with cough(when bladder is full), rare/improved. Denies hot flashes since on HRT, very mild night sweats. Ophthalmic migraines have decreased in frequency significantly (flashing once or twice a year) Mild constipation (controlled with fruits, drinking more water) Occasional diarrhea (every 3 mos, relieved by imodium) Left shoulder pain, much better s/p surgery and PT Allergies/asthma are well controlled Saw Dr. Denna Haggard last week, had 4 areas biopsied, all cancerous and has f/u scheduled for further excisions (never had melanoma). Slight weight gain noted (she relates to not working)     Observations/Objective:  Ht 5\' 4"  (1.626 m)   Wt 167 lb (75.8 kg)   BMI 28.67 kg/m   Wt Readings from Last 3 Encounters:  07/13/18 167 lb (75.8 kg)  02/17/18 163 lb 9.6 oz (74.2 kg)  02/01/18 160 lb (72.6 kg)    Exam is limited due to the virtual nature of the visit. Patient is alert, oriented, and in good spirits with full range of affect. Cranial  nerves are grossly intact. Normal eye contact, speech, grooming.    Chemistry      Component Value Date/Time   NA 145 (H) 07/12/2018 1046   K 4.9 07/12/2018 1046   CL 108 (H) 07/12/2018 1046   CO2 22 07/12/2018 1046   BUN 11 07/12/2018 1046   CREATININE 0.63 07/12/2018 1046   CREATININE 0.64 11/16/2016 1142      Component Value Date/Time   CALCIUM 8.9 07/12/2018 1046   ALKPHOS 90 07/12/2018 1046   AST 23 07/12/2018 1046   ALT 20 07/12/2018 1046   BILITOT 0.3 07/12/2018 1046     Fasting glucose 96  Lab Results  Component Value Date   CHOL 145 07/12/2018   HDL 36 (L) 07/12/2018   LDLCALC 61 07/12/2018   TRIG 238 (H) 07/12/2018   CHOLHDL 4.0 07/12/2018   Lab Results  Component Value Date   WBC 7.6 12/09/2017   HGB 13.5 12/09/2017   HCT 40.8 12/09/2017   MCV 95 12/09/2017   PLT 181 12/09/2017     Assessment  and Plan:  Medicare annual wellness visit, subsequent  Mixed hyperlipidemia - elevated TG noted on recent labs.  Educated re: diet; continue current regimen - Plan: atorvastatin (LIPITOR) 40 MG tablet  Sinus bradycardia  Gastroesophageal reflux disease, esophagitis presence not specified  Osteoarthritis of multiple joints, unspecified osteoarthritis type - Plan: celecoxib (CELEBREX) 200 MG capsule  Allergic rhinitis, unspecified seasonality, unspecified trigger - Plan: montelukast (SINGULAIR) 10 MG tablet  First degree AV block - rec f/u with cardiologist (who prev recommended she get yearly EKG's, hasn't since 2018)  Asthma in remission - continue current regimen - Plan: fluticasone (FLOVENT HFA) 220 MCG/ACT inhaler, montelukast (SINGULAIR) 10 MG tablet  Postmenopausal symptoms - counseled re: risks/benefits; encouraged to try and taper down in the cooler weather  Chronic back pain, unspecified back location, unspecified back pain laterality  Perennial allergic rhinitis - controlled on current regimen - Plan: fluticasone (FLONASE) 50 MCG/ACT nasal  spray  Osteopenia, unspecified location - very mild; rec 5 year f/u when she gets her mammo in the fall (as a baseline prior to stopping (hopefully) HRT.   Do spirometry at next visit (not done today, virtual visit, has been stable).  DEXA at [facility] [address] this year (in October, will be 5 years since the last)--f/u very mild osteopenia. Will do when schedules her mammogram   Discussed monthly self breast exams and yearly mammograms; at least 30 minutes of aerobic activity at least 5 days/week, weight-bearing exercise at least 2x/wk; proper sunscreen use reviewed; healthy diet, including goals of calcium and vitamin D intake and alcohol recommendations (less than or equal to 1 drink/day) reviewed; regular seatbelt use; changing batteries in smoke detectors, carbon monoxide detectors in the home. Immunization recommendations discussed--continue yearly high dose flu shots yearly. Colonoscopy recommendations reviewed, UTD (due again 2023).  DEXA--only slight decline over 11 year time frame, andcurrently on HRT. Repeat in 11/2018 (5 year f/u, as a baseline for if/when she comes off HRT)  Full Code, full care. (reviewed info on MOST form, unable to sign due to virtual nature of visit, but agrees to full care, ABX, IV and TF; has living will for when to withdraw care).   SCHEDULE 6 month visit (fasting)   Follow Up Instructions:    I discussed the assessment and treatment plan with the patient. The patient was provided an opportunity to ask questions and all were answered. The patient agreed with the plan and demonstrated an understanding of the instructions.   The patient was advised to call back or seek an in-person evaluation if the symptoms worsen or if the condition fails to improve as anticipated.  I provided 54 minutes of non-face-to-face time during this encounter.   Vikki Ports, MD  Medicare Attestation I have personally reviewed: The patient's medical and social history Their use of  alcohol, tobacco or illicit drugs Their current medications and supplements The patient's functional ability including ADLs,fall risks, home safety risks, cognitive, and hearing and visual impairment Diet and physical activities Evidence for depression or mood disorders  The patient's weight, height and BMI have been recorded in the chart.  I have made referrals, counseling, and provided education to the patient based on review of the above and I have provided the patient with a written personalized care plan for preventive services.

## 2018-07-10 NOTE — Patient Instructions (Addendum)
HEALTH MAINTENANCE RECOMMENDATIONS:  It is recommended that you get at least 30 minutes of aerobic exercise at least 5 days/week (for weight loss, you may need as much as 60-90 minutes). This can be any activity that gets your heart rate up. This can be divided in 10-15 minute intervals if needed, but try and build up your endurance at least once a week.  Weight bearing exercise is also recommended twice weekly.  Eat a healthy diet with lots of vegetables, fruits and fiber.  "Colorful" foods have a lot of vitamins (ie green vegetables, tomatoes, red peppers, etc).  Limit sweet tea, regular sodas and alcoholic beverages, all of which has a lot of calories and sugar.  Up to 1 alcoholic drink daily may be beneficial for women (unless trying to lose weight, watch sugars).  Drink a lot of water.  Calcium recommendations are 1200-1500 mg daily (1500 mg for postmenopausal women or women without ovaries), and vitamin D 1000 IU daily.  This should be obtained from diet and/or supplements (vitamins), and calcium should not be taken all at once, but in divided doses.  Monthly self breast exams and yearly mammograms are recommended.  Sunscreen of at least SPF 30 should be used on all sun-exposed parts of the skin when outside between the hours of 10 am and 4 pm (not just when at beach or pool, but even with exercise, golf, tennis, and yard work!)  Use a sunscreen that says "broad spectrum" so it covers both UVA and UVB rays, and make sure to reapply every 1-2 hours.  Remember to change the batteries in your smoke detectors when changing your clock times in the spring and fall. Carbon monoxide detectors are recommended for your home.  Use your seat belt every time you are in a car, and please drive safely and not be distracted with cell phones and texting while driving.   Ms. Novosad , Thank you for taking time to come for your Medicare Wellness Visit. I appreciate your ongoing commitment to your health  goals. Please review the following plan we discussed and let me know if I can assist you in the future.   These are the goals we discussed:  This is a list of the screening recommended for you and due dates:  Health Maintenance  Topic Date Due  . Flu Shot  09/17/2018  . Mammogram  12/10/2019  . Colon Cancer Screening  01/21/2022  . Tetanus Vaccine  10/07/2024  . DEXA scan (bone density measurement)  Completed  .  Hepatitis C: One time screening is recommended by Center for Disease Control  (CDC) for  adults born from [birth year] through 1963.   Completed  . Pneumonia vaccines  Completed   Dr. Johnsie Cancel had recommended getting yearly EKG's (due to bradycardia, first degree AV block.  You may want to schedule a follow-up appointment with his office for ongoing monitoring (since the possibility of eventually maybe needing a pacemaker had been discussed).  We discussed getting another bone density test in 11/2018, when you are due for you mammogram.  This will be 5 years from your last one.  [facility] can fax [place] the order to sign.  We discussed trying to taper back on the Estrace (cut in half, vs every other day), in order to gradually taper off, if you're able to.  I recommended waiting until the cooler weather to try this. No rush, but the goal is NOT to stay on this long-term, and to be on the lowest  effective dose to help with symptoms, if you are unable to stop it completely.  We briefly discussed healthy diet, daily exercise, and need to prevent further weight gain, and to lose some.  We discussed you contacting Dr. Kyla Balzarine office to arrange for routine follow-up (he had previously recommended yearly visits, EKG's).

## 2018-07-12 ENCOUNTER — Other Ambulatory Visit: Payer: Self-pay

## 2018-07-12 ENCOUNTER — Other Ambulatory Visit: Payer: Medicare Other

## 2018-07-12 DIAGNOSIS — Z5181 Encounter for therapeutic drug level monitoring: Secondary | ICD-10-CM

## 2018-07-12 DIAGNOSIS — E782 Mixed hyperlipidemia: Secondary | ICD-10-CM

## 2018-07-13 ENCOUNTER — Encounter: Payer: Self-pay | Admitting: Family Medicine

## 2018-07-13 ENCOUNTER — Ambulatory Visit: Payer: Medicare Other | Admitting: Family Medicine

## 2018-07-13 VITALS — Ht 64.0 in | Wt 167.0 lb

## 2018-07-13 DIAGNOSIS — J45998 Other asthma: Secondary | ICD-10-CM

## 2018-07-13 DIAGNOSIS — E782 Mixed hyperlipidemia: Secondary | ICD-10-CM

## 2018-07-13 DIAGNOSIS — I440 Atrioventricular block, first degree: Secondary | ICD-10-CM

## 2018-07-13 DIAGNOSIS — Z0000 Encounter for general adult medical examination without abnormal findings: Secondary | ICD-10-CM

## 2018-07-13 DIAGNOSIS — R001 Bradycardia, unspecified: Secondary | ICD-10-CM | POA: Diagnosis not present

## 2018-07-13 DIAGNOSIS — M159 Polyosteoarthritis, unspecified: Secondary | ICD-10-CM | POA: Diagnosis not present

## 2018-07-13 DIAGNOSIS — J3089 Other allergic rhinitis: Secondary | ICD-10-CM

## 2018-07-13 DIAGNOSIS — K219 Gastro-esophageal reflux disease without esophagitis: Secondary | ICD-10-CM | POA: Diagnosis not present

## 2018-07-13 DIAGNOSIS — M8580 Other specified disorders of bone density and structure, unspecified site: Secondary | ICD-10-CM

## 2018-07-13 DIAGNOSIS — M549 Dorsalgia, unspecified: Secondary | ICD-10-CM

## 2018-07-13 DIAGNOSIS — N959 Unspecified menopausal and perimenopausal disorder: Secondary | ICD-10-CM

## 2018-07-13 DIAGNOSIS — G8929 Other chronic pain: Secondary | ICD-10-CM

## 2018-07-13 DIAGNOSIS — J309 Allergic rhinitis, unspecified: Secondary | ICD-10-CM

## 2018-07-13 LAB — COMPREHENSIVE METABOLIC PANEL
ALT: 20 IU/L (ref 0–32)
AST: 23 IU/L (ref 0–40)
Albumin/Globulin Ratio: 2.6 — ABNORMAL HIGH (ref 1.2–2.2)
Albumin: 4.1 g/dL (ref 3.8–4.8)
Alkaline Phosphatase: 90 IU/L (ref 39–117)
BUN/Creatinine Ratio: 17 (ref 12–28)
BUN: 11 mg/dL (ref 8–27)
Bilirubin Total: 0.3 mg/dL (ref 0.0–1.2)
CO2: 22 mmol/L (ref 20–29)
Calcium: 8.9 mg/dL (ref 8.7–10.3)
Chloride: 108 mmol/L — ABNORMAL HIGH (ref 96–106)
Creatinine, Ser: 0.63 mg/dL (ref 0.57–1.00)
GFR calc Af Amer: 105 mL/min/{1.73_m2} (ref >59)
GFR calc non Af Amer: 91 mL/min/{1.73_m2} (ref >59)
Globulin, Total: 1.6 g/dL (ref 1.5–4.5)
Glucose: 96 mg/dL (ref 65–99)
Potassium: 4.9 mmol/L (ref 3.5–5.2)
Sodium: 145 mmol/L — ABNORMAL HIGH (ref 134–144)
Total Protein: 5.7 g/dL — ABNORMAL LOW (ref 6.0–8.5)

## 2018-07-13 LAB — LIPID PANEL
Chol/HDL Ratio: 4 ratio (ref 0.0–4.4)
Cholesterol, Total: 145 mg/dL (ref 100–199)
HDL: 36 mg/dL — ABNORMAL LOW (ref >39)
LDL Calculated: 61 mg/dL (ref 0–99)
Triglycerides: 238 mg/dL — ABNORMAL HIGH (ref 0–149)
VLDL Cholesterol Cal: 48 mg/dL — ABNORMAL HIGH (ref 5–40)

## 2018-07-13 MED ORDER — ATORVASTATIN CALCIUM 40 MG PO TABS: 40.0000 mg | ORAL_TABLET | Freq: Every day | ORAL | 1 refills | Status: DC

## 2018-07-13 MED ORDER — FLUTICASONE PROPIONATE 50 MCG/ACT NA SUSP: NASAL | 3 refills | Status: DC

## 2018-07-13 MED ORDER — AZELASTINE HCL 137 MCG/SPRAY NA SOLN: 2.0000 | Freq: Two times a day (BID) | NASAL | 3 refills | Status: DC

## 2018-07-13 MED ORDER — FLUTICASONE PROPIONATE HFA 220 MCG/ACT IN AERO: INHALATION_SPRAY | RESPIRATORY_TRACT | 3 refills | Status: DC

## 2018-07-13 MED ORDER — MONTELUKAST SODIUM 10 MG PO TABS: 10.0000 mg | ORAL_TABLET | Freq: Every day | ORAL | 3 refills | Status: DC

## 2018-07-13 MED ORDER — CELECOXIB 200 MG PO CAPS: 200.0000 mg | ORAL_CAPSULE | Freq: Two times a day (BID) | ORAL | 1 refills | Status: DC

## 2018-07-13 MED FILL — AZELASTINE HCL 137 MCG SPRY: 0.1 | 25 days supply | Qty: 30 | Fill #0

## 2018-07-13 MED FILL — FLUTICASONE PROP 50 MCG SPR: 50 | 90 days supply | Qty: 48 | Fill #0

## 2018-07-13 MED FILL — FLOVENT HFA 220 MCG INHALER: 220 | 90 days supply | Qty: 36 | Fill #0

## 2018-07-13 MED FILL — CELECOXIB 200 MG CAP: 200 | 90 days supply | Qty: 180 | Fill #0

## 2018-07-13 MED FILL — ATORVASTATIN 40 MG TABLET: 40 | 90 days supply | Qty: 90 | Fill #0

## 2018-07-13 MED FILL — MONTELUKAST SOD 10 MG TAB: 10 | 90 days supply | Qty: 90 | Fill #0

## 2018-07-14 NOTE — Progress Notes (Signed)
Pt is coming in November the 30th

## 2018-07-19 ENCOUNTER — Encounter: Payer: Self-pay | Admitting: Family Medicine

## 2018-07-20 ENCOUNTER — Other Ambulatory Visit: Payer: Medicare Other

## 2018-07-20 ENCOUNTER — Other Ambulatory Visit: Payer: Self-pay | Admitting: *Deleted

## 2018-07-20 ENCOUNTER — Other Ambulatory Visit: Payer: Self-pay

## 2018-07-20 DIAGNOSIS — G453 Amaurosis fugax: Secondary | ICD-10-CM

## 2018-07-21 LAB — CBC WITH DIFFERENTIAL/PLATELET
Basophils Absolute: 0 10*3/uL (ref 0.0–0.2)
Basos: 1 %
EOS (ABSOLUTE): 0.1 10*3/uL (ref 0.0–0.4)
Eos: 1 %
Hematocrit: 39.4 % (ref 34.0–46.6)
Hemoglobin: 13.3 g/dL (ref 11.1–15.9)
Immature Grans (Abs): 0.1 10*3/uL (ref 0.0–0.1)
Immature Granulocytes: 1 %
Lymphocytes Absolute: 1.8 10*3/uL (ref 0.7–3.1)
Lymphs: 35 %
MCH: 31.8 pg (ref 26.6–33.0)
MCHC: 33.8 g/dL (ref 31.5–35.7)
MCV: 94 fL (ref 79–97)
Monocytes Absolute: 0.5 10*3/uL (ref 0.1–0.9)
Monocytes: 9 %
Neutrophils Absolute: 2.8 10*3/uL (ref 1.4–7.0)
Neutrophils: 53 %
Platelets: 167 10*3/uL (ref 150–450)
RBC: 4.18 x10E6/uL (ref 3.77–5.28)
RDW: 14.1 % (ref 11.7–15.4)
WBC: 5.2 10*3/uL (ref 3.4–10.8)

## 2018-07-21 LAB — ANCA TITERS
Atypical pANCA: <1:20 {titer}
C-ANCA: <1:20 {titer}
P-ANCA: <1:20 {titer}

## 2018-07-21 LAB — SEDIMENTATION RATE: Sed Rate: 3 mm/hr (ref 0–40)

## 2018-07-21 LAB — C-REACTIVE PROTEIN: CRP: 3 mg/L (ref 0–10)

## 2018-07-21 LAB — ANTINUCLEAR ANTIBODIES, IFA: ANA Titer 1: NEGATIVE

## 2018-07-28 ENCOUNTER — Encounter: Payer: Self-pay | Admitting: Plastic Surgery

## 2018-07-28 ENCOUNTER — Other Ambulatory Visit: Payer: Self-pay

## 2018-07-28 ENCOUNTER — Ambulatory Visit: Payer: Medicare Other | Admitting: Plastic Surgery

## 2018-07-28 DIAGNOSIS — S022XXA Fracture of nasal bones, initial encounter for closed fracture: Secondary | ICD-10-CM | POA: Diagnosis not present

## 2018-07-28 DIAGNOSIS — S0993XA Unspecified injury of face, initial encounter: Secondary | ICD-10-CM | POA: Insufficient documentation

## 2018-07-28 DIAGNOSIS — H57819 Brow ptosis, unspecified: Secondary | ICD-10-CM | POA: Diagnosis not present

## 2018-07-28 NOTE — Progress Notes (Signed)
Patient ID: Beverly [name], female    DOB: April 02, 1945, 73 y.o.   MRN: [id number]   Chief Complaint  Patient presents with  . Advice Only    for nose injury and bleph    The patient is a 73 year old female here for evaluation of her face.  She was at a friend's house a few days ago when she fell.  She sustained multiple areas of bruising to her left periorbital area forehead and cheek.  She also felt like she had a nasal fracture as well.  She had a known nasal deviation.  She is now states that it is even more difficult to breathe out of the left side of her nose.  This is evident on exam.  She has bruising of all mentioned areas.  She has nasal deviation with restriction on the left side.  And an outfracture of the right nasal bone.  She had a upper lid blepharoplasty about a year ago at [facility]  She has noticeable brow ptosis.  It does not look like she has any ptosis of her upper lid muscles.  However the brow and excess skin is such that she has a noticeable restriction in her vision.  This is noticeable mostly with the light.  The pictures show how much is hanging down.  The right is worse than the left.  She is otherwise in good health.   Review of Systems  Constitutional: Negative for activity change and appetite change.  HENT: Positive for congestion and nosebleeds.   Eyes: Positive for visual disturbance.  Respiratory: Negative.   Cardiovascular: Negative.   Gastrointestinal: Negative.   Endocrine: Negative.   Genitourinary: Negative.   Musculoskeletal: Negative.   Skin: Positive for color change.  Hematological: Negative.   Psychiatric/Behavioral: Negative.     Past Medical History:  Diagnosis Date  . Allergy   . Amaurosis fugax    negative w/u through [facility]  . Asthma   . Back pain    Dr Joline Salt 02/2010-epidural injection x 2 at L4-5 with good effect  . BCC (basal cell carcinoma of skin)    Dr. Denna Haggard  . Cataract    left  . Chronic pain   .  Depression   . Diverticulosis of colon 1998   mild  . DJD (degenerative joint disease)   . Duodenal ulcer    h/o  . GERD (gastroesophageal reflux disease)   . History of SCC (squamous cell carcinoma) of skin    Dr. Denna Haggard  . History of sinus bradycardia   . HLD (hyperlipidemia)    hypertriglyceridemia  . Hypertensive retinopathy of both eyes 11/18/2017  . IBS (irritable bowel syndrome)   . Internal hemorrhoids 1998  . Mitral regurgitation    mild  . Ocular migraine 10/31/14   Dr.Hecker  . Osteoarthritis    feet,shoulder,neck,back,hips and hands.  . Vitamin D deficiency    mild    Past Surgical History:  Procedure Laterality Date  . BLEPHAROPLASTY Bilateral 01/2018  . BUNIONECTOMY     R 12/08, L 2004 (Dr. Janus Molder)  . CARPAL TUNNEL RELEASE  1989   bilateral  . CATARACT EXTRACTION Bilateral Left in 08/2012, Right 04/2016   Dr.Hecker  . CESAREAN SECTION     x2  . CHOLECYSTECTOMY  1992  . COLONOSCOPY  2006  . COLONOSCOPY  01/2012   due again 01/2022; mild diverticulosis  . epidural steroid injection, back  02/2010  . HIP SURGERY     right bursectomy  x 3  . HIP SURGERY Right 2000   torn cartilage, repaired  . INGUINAL HERNIA REPAIR  9/09   bilat  . NECK SURGERY  1989   c6-7 cervical laminectomy and diskecotmy  . SHOULDER SURGERY  2/05   left rotator cuff repair  . SHOULDER SURGERY Left 03/14/2018   rotator cuff repair; Dr. Tonita Cong  . TONSILLECTOMY  age 45  . TOTAL ABDOMINAL HYSTERECTOMY W/ BILATERAL SALPINGOOPHORECTOMY  1997   fibroids  . TROCHANTERIC BURSA EXCISION Right 1978  . TUBAL LIGATION  1986  . WISDOM TOOTH EXTRACTION        Current Outpatient Medications:  .  acetaminophen (TYLENOL) 500 MG tablet, Take 1,000 mg by mouth as needed. Reported on 05/15/2015, Disp: , Rfl:  .  albuterol (PROVENTIL HFA;VENTOLIN HFA) 108 (90 Base) MCG/ACT inhaler, Inhale 2 puffs into the lungs every 6 (six) hours as needed for wheezing or shortness of breath., Disp: 18 g, Rfl: 1  .  aspirin EC 81 MG tablet, Take 81 mg by mouth daily., Disp: , Rfl:  .  atorvastatin (LIPITOR) 40 MG tablet, Take 1 tablet (40 mg total) by mouth daily., Disp: 90 tablet, Rfl: 1 .  Azelastine HCl 137 MCG/SPRAY SOLN, Place 2 Pump into both nostrils 2 (two) times a day., Disp: 90 mL, Rfl: 3 .  Calcium Carbonate-Vitamin D (CALCIUM 600+D) 600-200 MG-UNIT TABS, Take 1 tablet by mouth 2 (two) times daily.  , Disp: , Rfl:  .  celecoxib (CELEBREX) 200 MG capsule, Take 1 capsule (200 mg total) by mouth 2 (two) times daily., Disp: 180 capsule, Rfl: 1 .  cetirizine (ZYRTEC) 10 MG tablet, Take 10 mg by mouth daily.  , Disp: , Rfl:  .  Dextromethorphan-guaiFENesin (GUAIFENESIN DM PO), Take by mouth., Disp: , Rfl:  .  esomeprazole (NEXIUM) 40 MG capsule, TAKE 1 CAPSULE BY MOUTH DAILY BEFORE SUPPER., Disp: 90 capsule, Rfl: 1 .  estradiol (ESTRACE) 0.5 MG tablet, TAKE 1 TABLET BY MOUTH ONCE DAILY, Disp: 90 tablet, Rfl: 3 .  fentaNYL (DURAGESIC - DOSED MCG/HR) 25 MCG/HR, Place 1 patch onto the skin every 3 (three) days.  , Disp: , Rfl:  .  Fluorouracil (TOLAK) 4 % CREA, Apply topically every 3 (three) days., Disp: , Rfl:  .  fluticasone (FLONASE) 50 MCG/ACT nasal spray, PLACE 2 SPRAYS INTO THE NOSE DAILY., Disp: 48 g, Rfl: 3 .  fluticasone (FLOVENT HFA) 220 MCG/ACT inhaler, INHALE 2 PUFFS INTO THE LUNGS 2 (TWO) TIMES DAILY., Disp: 36 g, Rfl: 3 .  gabapentin (NEURONTIN) 800 MG tablet, Take 800 mg by mouth at bedtime. , Disp: , Rfl:  .  loperamide (IMODIUM A-D) 2 MG tablet, Take 2 mg by mouth 4 (four) times daily as needed for diarrhea or loose stools., Disp: , Rfl:  .  methocarbamol (ROBAXIN) 500 MG tablet, Take 500 mg by mouth daily with breakfast., Disp: , Rfl:  .  montelukast (SINGULAIR) 10 MG tablet, Take 1 tablet (10 mg total) by mouth at bedtime., Disp: 90 tablet, Rfl: 3 .  Multiple Vitamins-Minerals (MULTIVITAMIN WITH MINERALS) tablet, Take 1 tablet by mouth daily., Disp: , Rfl:  .  omega-3 acid ethyl  esters (LOVAZA) 1 g capsule, TAKE 2 CAPSULES BY MOUTH 2 TIMES DAILY., Disp: 360 capsule, Rfl: 1 .  PARoxetine (PAXIL) 30 MG tablet, Take 30 mg by mouth daily. , Disp: , Rfl:  .  pseudoephedrine (SUDAFED) 30 MG tablet, Take 30 mg by mouth every 4 (four) hours as needed for congestion., Disp: ,  Rfl:    Objective:   Vitals:   07/28/18 1355  BP: 129/73  Pulse: 60  Temp: 98.2 F (36.8 C)  SpO2: 94%    Physical Exam Vitals signs and nursing note reviewed.  HENT:     Mouth/Throat:     Mouth: Mucous membranes are moist.     Comments: HPI Eyes:     Extraocular Movements: Extraocular movements intact.  Neck:     Musculoskeletal: Normal range of motion.  Cardiovascular:     Rate and Rhythm: Normal rate.     Pulses: Normal pulses.  Pulmonary:     Effort: Pulmonary effort is normal. No respiratory distress.  Abdominal:     General: Abdomen is flat. There is no distension.  Neurological:     General: No focal deficit present.     Mental Status: She is alert and oriented to person, place, and time.  Psychiatric:        Mood and Affect: Mood normal.        Behavior: Behavior normal.        Thought Content: Thought content normal.     Assessment & Plan:   Brow ptosis Closed fracture of nasal bone Recommend close nasal reduction of the nasal fracture.  Recommend brow lift for the brow ptosis.  Pictures were obtained of the patient and placed in the chart with the patient's or guardian's permission.   [place], DO

## 2018-08-02 ENCOUNTER — Other Ambulatory Visit: Payer: Self-pay | Admitting: Surgery

## 2018-08-02 DIAGNOSIS — G453 Amaurosis fugax: Secondary | ICD-10-CM

## 2018-08-03 MED FILL — GABAPENTIN 800 MG TABS: 800 | 90 days supply | Qty: 90 | Fill #2

## 2018-08-15 ENCOUNTER — Telehealth: Payer: Self-pay | Admitting: Plastic Surgery

## 2018-08-15 NOTE — Telephone Encounter (Signed)

## 2018-08-16 ENCOUNTER — Other Ambulatory Visit: Payer: Self-pay

## 2018-08-16 ENCOUNTER — Ambulatory Visit: Payer: Medicare Other | Admitting: Plastic Surgery

## 2018-08-16 ENCOUNTER — Other Ambulatory Visit: Payer: Self-pay | Admitting: Family Medicine

## 2018-08-16 VITALS — BP 130/80 | HR 88 | Temp 98.7°F

## 2018-08-16 DIAGNOSIS — S0993XA Unspecified injury of face, initial encounter: Secondary | ICD-10-CM | POA: Diagnosis not present

## 2018-08-16 DIAGNOSIS — H57819 Brow ptosis, unspecified: Secondary | ICD-10-CM | POA: Diagnosis not present

## 2018-08-16 MED FILL — OMEGA-3 ETHYL ESTERS 1 GM C: 1 | 90 days supply | Qty: 360 | Fill #0

## 2018-08-16 MED FILL — PARoxetine HCL 40 MG TABS: 40 | 90 days supply | Qty: 90 | Fill #0

## 2018-08-17 ENCOUNTER — Encounter: Payer: Self-pay | Admitting: Plastic Surgery

## 2018-08-17 NOTE — H&P (View-Only) (Signed)
Patient ID: Beverly [name], female    DOB: September 07, 1945, 73 y.o.   MRN: [id number]   Chief Complaint  Patient presents with  . Skin Problem    The patient is a 73 yrs old wf here for further evaluation of her forehead.  She noticed some improvement in her spectrum of visual field after she had a blepharoplasty.  She still notices a darkness and difficulty with seeing the full spectrum of the visual field due to the hooding from her brows.  She notices when she pulls up the brow she has marked improvement in her vision.  We took pictures today with and without tape.  I was impressed with the improvement.  She immediately responded with how light it was with the taping.   Review of Systems  Constitutional: Negative.  Negative for activity change and appetite change.  HENT: Negative.   Eyes: Negative.   Respiratory: Negative.  Negative for chest tightness and shortness of breath.   Cardiovascular: Negative for leg swelling.  Gastrointestinal: Negative.  Negative for abdominal pain.  Endocrine: Negative.   Genitourinary: Negative.   Musculoskeletal: Negative.   Skin: Negative for color change and wound.  Psychiatric/Behavioral: Negative.     Past Medical History:  Diagnosis Date  . Allergy   . Amaurosis fugax    negative w/u through [facility]  . Asthma   . Back pain    Dr Joline Salt 02/2010-epidural injection x 2 at L4-5 with good effect  . BCC (basal cell carcinoma of skin)    Dr. Denna Haggard  . Cataract    left  . Chronic pain   . Depression   . Diverticulosis of colon 1998   mild  . DJD (degenerative joint disease)   . Duodenal ulcer    h/o  . GERD (gastroesophageal reflux disease)   . History of SCC (squamous cell carcinoma) of skin    Dr. Denna Haggard  . History of sinus bradycardia   . HLD (hyperlipidemia)    hypertriglyceridemia  . Hypertensive retinopathy of both eyes 11/18/2017  . IBS (irritable bowel syndrome)   . Internal hemorrhoids 1998  . Mitral regurgitation     mild  . Ocular migraine 10/31/14   Dr.Hecker  . Osteoarthritis    feet,shoulder,neck,back,hips and hands.  . Vitamin D deficiency    mild    Past Surgical History:  Procedure Laterality Date  . BLEPHAROPLASTY Bilateral 01/2018  . BUNIONECTOMY     R 12/08, L 2004 (Dr. Janus Molder)  . CARPAL TUNNEL RELEASE  1989   bilateral  . CATARACT EXTRACTION Bilateral Left in 08/2012, Right 04/2016   Dr.Hecker  . CESAREAN SECTION     x2  . CHOLECYSTECTOMY  1992  . COLONOSCOPY  2006  . COLONOSCOPY  01/2012   due again 01/2022; mild diverticulosis  . epidural steroid injection, back  02/2010  . HIP SURGERY     right bursectomy x 3  . HIP SURGERY Right 2000   torn cartilage, repaired  . INGUINAL HERNIA REPAIR  9/09   bilat  . NECK SURGERY  1989   c6-7 cervical laminectomy and diskecotmy  . SHOULDER SURGERY  2/05   left rotator cuff repair  . SHOULDER SURGERY Left 03/14/2018   rotator cuff repair; Dr. Tonita Cong  . TONSILLECTOMY  age 70  . TOTAL ABDOMINAL HYSTERECTOMY W/ BILATERAL SALPINGOOPHORECTOMY  1997   fibroids  . TROCHANTERIC BURSA EXCISION Right 1978  . TUBAL LIGATION  1986  . WISDOM TOOTH EXTRACTION  Current Outpatient Medications:  .  acetaminophen (TYLENOL) 500 MG tablet, Take 1,000 mg by mouth as needed. Reported on 05/15/2015, Disp: , Rfl:  .  albuterol (PROVENTIL HFA;VENTOLIN HFA) 108 (90 Base) MCG/ACT inhaler, Inhale 2 puffs into the lungs every 6 (six) hours as needed for wheezing or shortness of breath., Disp: 18 g, Rfl: 1 .  aspirin EC 81 MG tablet, Take 81 mg by mouth daily., Disp: , Rfl:  .  atorvastatin (LIPITOR) 40 MG tablet, Take 1 tablet (40 mg total) by mouth daily., Disp: 90 tablet, Rfl: 1 .  Azelastine HCl 137 MCG/SPRAY SOLN, Place 2 Pump into both nostrils 2 (two) times a day., Disp: 90 mL, Rfl: 3 .  Calcium Carbonate-Vitamin D (CALCIUM 600+D) 600-200 MG-UNIT TABS, Take 1 tablet by mouth 2 (two) times daily.  , Disp: , Rfl:  .  celecoxib (CELEBREX) 200 MG  capsule, Take 1 capsule (200 mg total) by mouth 2 (two) times daily., Disp: 180 capsule, Rfl: 1 .  cetirizine (ZYRTEC) 10 MG tablet, Take 10 mg by mouth daily.  , Disp: , Rfl:  .  Dextromethorphan-guaiFENesin (GUAIFENESIN DM PO), Take by mouth., Disp: , Rfl:  .  esomeprazole (NEXIUM) 40 MG capsule, TAKE 1 CAPSULE BY MOUTH DAILY BEFORE SUPPER., Disp: 90 capsule, Rfl: 1 .  estradiol (ESTRACE) 0.5 MG tablet, TAKE 1 TABLET BY MOUTH ONCE DAILY, Disp: 90 tablet, Rfl: 3 .  fentaNYL (DURAGESIC - DOSED MCG/HR) 25 MCG/HR, Place 1 patch onto the skin every 3 (three) days.  , Disp: , Rfl:  .  Fluorouracil (TOLAK) 4 % CREA, Apply topically every 3 (three) days., Disp: , Rfl:  .  fluticasone (FLONASE) 50 MCG/ACT nasal spray, PLACE 2 SPRAYS INTO THE NOSE DAILY., Disp: 48 g, Rfl: 3 .  fluticasone (FLOVENT HFA) 220 MCG/ACT inhaler, INHALE 2 PUFFS INTO THE LUNGS 2 (TWO) TIMES DAILY., Disp: 36 g, Rfl: 3 .  gabapentin (NEURONTIN) 800 MG tablet, Take 800 mg by mouth at bedtime. , Disp: , Rfl:  .  loperamide (IMODIUM A-D) 2 MG tablet, Take 2 mg by mouth 4 (four) times daily as needed for diarrhea or loose stools., Disp: , Rfl:  .  methocarbamol (ROBAXIN) 500 MG tablet, Take 500 mg by mouth daily with breakfast., Disp: , Rfl:  .  montelukast (SINGULAIR) 10 MG tablet, Take 1 tablet (10 mg total) by mouth at bedtime., Disp: 90 tablet, Rfl: 3 .  Multiple Vitamins-Minerals (MULTIVITAMIN WITH MINERALS) tablet, Take 1 tablet by mouth daily., Disp: , Rfl:  .  omega-3 acid ethyl esters (LOVAZA) 1 g capsule, TAKE 2 CAPSULES BY MOUTH 2 TIMES DAILY., Disp: 360 capsule, Rfl: 0 .  PARoxetine (PAXIL) 30 MG tablet, Take 30 mg by mouth daily. , Disp: , Rfl:  .  pseudoephedrine (SUDAFED) 30 MG tablet, Take 30 mg by mouth every 4 (four) hours as needed for congestion., Disp: , Rfl:    Objective:   Vitals:   08/16/18 1534  BP: 130/80  Pulse: 88  Temp: 98.7 F (37.1 C)    Physical Exam Vitals signs and nursing note reviewed.   HENT:     Head: Normocephalic.     Nose: Congestion present.     Mouth/Throat:     Mouth: Mucous membranes are moist.  Cardiovascular:     Rate and Rhythm: Normal rate.     Pulses: Normal pulses.  Pulmonary:     Effort: Pulmonary effort is normal. No respiratory distress.  Neurological:     General: No  focal deficit present.     Mental Status: She is alert. Mental status is at baseline.  Psychiatric:        Mood and Affect: Mood normal.        Behavior: Behavior normal.     Assessment & Plan:  Facial injury, initial encounter  Brow ptosis  Strongly recommend bilateral brow lift for correction of debilitating visual field disturbance with the hooding. Pictures were obtained of the patient and placed in the chart with the patient's or guardian's permission.  [place], DO

## 2018-08-17 NOTE — Progress Notes (Signed)
Patient ID: Beverly [name], female    DOB: February 27, 1945, 73 y.o.   MRN: [id number]   Chief Complaint  Patient presents with  . Skin Problem    The patient is a 73 yrs old wf here for further evaluation of her forehead.  She noticed some improvement in her spectrum of visual field after she had a blepharoplasty.  She still notices a darkness and difficulty with seeing the full spectrum of the visual field due to the hooding from her brows.  She notices when she pulls up the brow she has marked improvement in her vision.  We took pictures today with and without tape.  I was impressed with the improvement.  She immediately responded with how light it was with the taping.   Review of Systems  Constitutional: Negative.  Negative for activity change and appetite change.  HENT: Negative.   Eyes: Negative.   Respiratory: Negative.  Negative for chest tightness and shortness of breath.   Cardiovascular: Negative for leg swelling.  Gastrointestinal: Negative.  Negative for abdominal pain.  Endocrine: Negative.   Genitourinary: Negative.   Musculoskeletal: Negative.   Skin: Negative for color change and wound.  Psychiatric/Behavioral: Negative.     Past Medical History:  Diagnosis Date  . Allergy   . Amaurosis fugax    negative w/u through [facility]  . Asthma   . Back pain    Dr Joline Salt 02/2010-epidural injection x 2 at L4-5 with good effect  . BCC (basal cell carcinoma of skin)    Dr. Denna Haggard  . Cataract    left  . Chronic pain   . Depression   . Diverticulosis of colon 1998   mild  . DJD (degenerative joint disease)   . Duodenal ulcer    h/o  . GERD (gastroesophageal reflux disease)   . History of SCC (squamous cell carcinoma) of skin    Dr. Denna Haggard  . History of sinus bradycardia   . HLD (hyperlipidemia)    hypertriglyceridemia  . Hypertensive retinopathy of both eyes 11/18/2017  . IBS (irritable bowel syndrome)   . Internal hemorrhoids 1998  . Mitral regurgitation     mild  . Ocular migraine 10/31/14   Dr.Hecker  . Osteoarthritis    feet,shoulder,neck,back,hips and hands.  . Vitamin D deficiency    mild    Past Surgical History:  Procedure Laterality Date  . BLEPHAROPLASTY Bilateral 01/2018  . BUNIONECTOMY     R 12/08, L 2004 (Dr. Janus Molder)  . CARPAL TUNNEL RELEASE  1989   bilateral  . CATARACT EXTRACTION Bilateral Left in 08/2012, Right 04/2016   Dr.Hecker  . CESAREAN SECTION     x2  . CHOLECYSTECTOMY  1992  . COLONOSCOPY  2006  . COLONOSCOPY  01/2012   due again 01/2022; mild diverticulosis  . epidural steroid injection, back  02/2010  . HIP SURGERY     right bursectomy x 3  . HIP SURGERY Right 2000   torn cartilage, repaired  . INGUINAL HERNIA REPAIR  9/09   bilat  . NECK SURGERY  1989   c6-7 cervical laminectomy and diskecotmy  . SHOULDER SURGERY  2/05   left rotator cuff repair  . SHOULDER SURGERY Left 03/14/2018   rotator cuff repair; Dr. Tonita Cong  . TONSILLECTOMY  age 73  . TOTAL ABDOMINAL HYSTERECTOMY W/ BILATERAL SALPINGOOPHORECTOMY  1997   fibroids  . TROCHANTERIC BURSA EXCISION Right 1978  . TUBAL LIGATION  1986  . WISDOM TOOTH EXTRACTION  Current Outpatient Medications:  .  acetaminophen (TYLENOL) 500 MG tablet, Take 1,000 mg by mouth as needed. Reported on 05/15/2015, Disp: , Rfl:  .  albuterol (PROVENTIL HFA;VENTOLIN HFA) 108 (90 Base) MCG/ACT inhaler, Inhale 2 puffs into the lungs every 6 (six) hours as needed for wheezing or shortness of breath., Disp: 18 g, Rfl: 1 .  aspirin EC 81 MG tablet, Take 81 mg by mouth daily., Disp: , Rfl:  .  atorvastatin (LIPITOR) 40 MG tablet, Take 1 tablet (40 mg total) by mouth daily., Disp: 90 tablet, Rfl: 1 .  Azelastine HCl 137 MCG/SPRAY SOLN, Place 2 Pump into both nostrils 2 (two) times a day., Disp: 90 mL, Rfl: 3 .  Calcium Carbonate-Vitamin D (CALCIUM 600+D) 600-200 MG-UNIT TABS, Take 1 tablet by mouth 2 (two) times daily.  , Disp: , Rfl:  .  celecoxib (CELEBREX) 200 MG  capsule, Take 1 capsule (200 mg total) by mouth 2 (two) times daily., Disp: 180 capsule, Rfl: 1 .  cetirizine (ZYRTEC) 10 MG tablet, Take 10 mg by mouth daily.  , Disp: , Rfl:  .  Dextromethorphan-guaiFENesin (GUAIFENESIN DM PO), Take by mouth., Disp: , Rfl:  .  esomeprazole (NEXIUM) 40 MG capsule, TAKE 1 CAPSULE BY MOUTH DAILY BEFORE SUPPER., Disp: 90 capsule, Rfl: 1 .  estradiol (ESTRACE) 0.5 MG tablet, TAKE 1 TABLET BY MOUTH ONCE DAILY, Disp: 90 tablet, Rfl: 3 .  fentaNYL (DURAGESIC - DOSED MCG/HR) 25 MCG/HR, Place 1 patch onto the skin every 3 (three) days.  , Disp: , Rfl:  .  Fluorouracil (TOLAK) 4 % CREA, Apply topically every 3 (three) days., Disp: , Rfl:  .  fluticasone (FLONASE) 50 MCG/ACT nasal spray, PLACE 2 SPRAYS INTO THE NOSE DAILY., Disp: 48 g, Rfl: 3 .  fluticasone (FLOVENT HFA) 220 MCG/ACT inhaler, INHALE 2 PUFFS INTO THE LUNGS 2 (TWO) TIMES DAILY., Disp: 36 g, Rfl: 3 .  gabapentin (NEURONTIN) 800 MG tablet, Take 800 mg by mouth at bedtime. , Disp: , Rfl:  .  loperamide (IMODIUM A-D) 2 MG tablet, Take 2 mg by mouth 4 (four) times daily as needed for diarrhea or loose stools., Disp: , Rfl:  .  methocarbamol (ROBAXIN) 500 MG tablet, Take 500 mg by mouth daily with breakfast., Disp: , Rfl:  .  montelukast (SINGULAIR) 10 MG tablet, Take 1 tablet (10 mg total) by mouth at bedtime., Disp: 90 tablet, Rfl: 3 .  Multiple Vitamins-Minerals (MULTIVITAMIN WITH MINERALS) tablet, Take 1 tablet by mouth daily., Disp: , Rfl:  .  omega-3 acid ethyl esters (LOVAZA) 1 g capsule, TAKE 2 CAPSULES BY MOUTH 2 TIMES DAILY., Disp: 360 capsule, Rfl: 0 .  PARoxetine (PAXIL) 30 MG tablet, Take 30 mg by mouth daily. , Disp: , Rfl:  .  pseudoephedrine (SUDAFED) 30 MG tablet, Take 30 mg by mouth every 4 (four) hours as needed for congestion., Disp: , Rfl:    Objective:   Vitals:   08/16/18 1534  BP: 130/80  Pulse: 88  Temp: 98.7 F (37.1 C)    Physical Exam Vitals signs and nursing note reviewed.   HENT:     Head: Normocephalic.     Nose: Congestion present.     Mouth/Throat:     Mouth: Mucous membranes are moist.  Cardiovascular:     Rate and Rhythm: Normal rate.     Pulses: Normal pulses.  Pulmonary:     Effort: Pulmonary effort is normal. No respiratory distress.  Neurological:     General: No  focal deficit present.     Mental Status: She is alert. Mental status is at baseline.  Psychiatric:        Mood and Affect: Mood normal.        Behavior: Behavior normal.     Assessment & Plan:  Facial injury, initial encounter  Brow ptosis  Strongly recommend bilateral brow lift for correction of debilitating visual field disturbance with the hooding. Pictures were obtained of the patient and placed in the chart with the patient's or guardian's permission.  [place], DO

## 2018-08-22 ENCOUNTER — Ambulatory Visit
Admission: RE | Admit: 2018-08-22 | Discharge: 2018-08-22 | Disposition: A | Discharge status: Home / Self Care | Payer: Medicare Other | Source: Ambulatory Visit | Attending: Surgery | Admitting: Surgery

## 2018-08-22 DIAGNOSIS — G453 Amaurosis fugax: Secondary | ICD-10-CM

## 2018-08-22 MED ORDER — GADOBENATE DIMEGLUMINE 529 MG/ML IV SOLN
15.0000 mL | Freq: Once | INTRAVENOUS | Status: AC | PRN
  Administered 2018-08-22: 15 mL via INTRAVENOUS

## 2018-08-23 ENCOUNTER — Encounter (HOSPITAL_BASED_OUTPATIENT_CLINIC_OR_DEPARTMENT_OTHER): Payer: Self-pay | Admitting: *Deleted

## 2018-08-23 ENCOUNTER — Other Ambulatory Visit: Payer: Self-pay

## 2018-08-25 ENCOUNTER — Other Ambulatory Visit (HOSPITAL_COMMUNITY)
Admission: RE | Admit: 2018-08-25 | Discharge: 2018-08-25 | Disposition: A | Discharge status: Home / Self Care | Payer: Medicare Other | Source: Ambulatory Visit | Attending: Plastic Surgery | Admitting: Plastic Surgery

## 2018-08-25 DIAGNOSIS — Z1159 Encounter for screening for other viral diseases: Secondary | ICD-10-CM | POA: Insufficient documentation

## 2018-08-25 DIAGNOSIS — Z01812 Encounter for preprocedural laboratory examination: Secondary | ICD-10-CM | POA: Diagnosis present

## 2018-08-26 LAB — SARS CORONAVIRUS 2 (TAT 6-24 HRS): SARS Coronavirus 2: NEGATIVE

## 2018-08-27 ENCOUNTER — Encounter: Payer: Self-pay | Admitting: Family Medicine

## 2018-08-29 ENCOUNTER — Other Ambulatory Visit: Payer: Self-pay

## 2018-08-29 ENCOUNTER — Ambulatory Visit (HOSPITAL_BASED_OUTPATIENT_CLINIC_OR_DEPARTMENT_OTHER): Payer: Medicare Other | Admitting: Anesthesiology

## 2018-08-29 ENCOUNTER — Encounter (HOSPITAL_BASED_OUTPATIENT_CLINIC_OR_DEPARTMENT_OTHER)
Admission: RE | Disposition: A | Discharge status: Home / Self Care | Payer: Self-pay | Source: Home / Self Care | Attending: Plastic Surgery

## 2018-08-29 ENCOUNTER — Ambulatory Visit (HOSPITAL_BASED_OUTPATIENT_CLINIC_OR_DEPARTMENT_OTHER)
Admission: RE | Admit: 2018-08-29 | Discharge: 2018-08-29 | Disposition: A | Discharge status: Home / Self Care | Payer: Medicare Other | Attending: Plastic Surgery | Admitting: Plastic Surgery

## 2018-08-29 ENCOUNTER — Encounter (HOSPITAL_BASED_OUTPATIENT_CLINIC_OR_DEPARTMENT_OTHER): Payer: Self-pay

## 2018-08-29 DIAGNOSIS — S022XXA Fracture of nasal bones, initial encounter for closed fracture: Secondary | ICD-10-CM | POA: Diagnosis present

## 2018-08-29 DIAGNOSIS — E559 Vitamin D deficiency, unspecified: Secondary | ICD-10-CM | POA: Diagnosis not present

## 2018-08-29 DIAGNOSIS — Z791 Long term (current) use of non-steroidal anti-inflammatories (NSAID): Secondary | ICD-10-CM | POA: Diagnosis not present

## 2018-08-29 DIAGNOSIS — E785 Hyperlipidemia, unspecified: Secondary | ICD-10-CM | POA: Insufficient documentation

## 2018-08-29 DIAGNOSIS — G8929 Other chronic pain: Secondary | ICD-10-CM | POA: Insufficient documentation

## 2018-08-29 DIAGNOSIS — X58XXXA Exposure to other specified factors, initial encounter: Secondary | ICD-10-CM | POA: Insufficient documentation

## 2018-08-29 DIAGNOSIS — H57819 Brow ptosis, unspecified: Secondary | ICD-10-CM | POA: Insufficient documentation

## 2018-08-29 DIAGNOSIS — K219 Gastro-esophageal reflux disease without esophagitis: Secondary | ICD-10-CM | POA: Diagnosis not present

## 2018-08-29 DIAGNOSIS — Z7982 Long term (current) use of aspirin: Secondary | ICD-10-CM | POA: Insufficient documentation

## 2018-08-29 DIAGNOSIS — F329 Major depressive disorder, single episode, unspecified: Secondary | ICD-10-CM | POA: Diagnosis not present

## 2018-08-29 DIAGNOSIS — Z79899 Other long term (current) drug therapy: Secondary | ICD-10-CM | POA: Diagnosis not present

## 2018-08-29 DIAGNOSIS — Z85828 Personal history of other malignant neoplasm of skin: Secondary | ICD-10-CM | POA: Insufficient documentation

## 2018-08-29 DIAGNOSIS — M1990 Unspecified osteoarthritis, unspecified site: Secondary | ICD-10-CM | POA: Diagnosis not present

## 2018-08-29 DIAGNOSIS — R001 Bradycardia, unspecified: Secondary | ICD-10-CM | POA: Insufficient documentation

## 2018-08-29 DIAGNOSIS — Z9842 Cataract extraction status, left eye: Secondary | ICD-10-CM | POA: Diagnosis not present

## 2018-08-29 DIAGNOSIS — K589 Irritable bowel syndrome without diarrhea: Secondary | ICD-10-CM | POA: Diagnosis not present

## 2018-08-29 DIAGNOSIS — J45909 Unspecified asthma, uncomplicated: Secondary | ICD-10-CM | POA: Insufficient documentation

## 2018-08-29 DIAGNOSIS — Z7951 Long term (current) use of inhaled steroids: Secondary | ICD-10-CM | POA: Insufficient documentation

## 2018-08-29 DIAGNOSIS — Z9049 Acquired absence of other specified parts of digestive tract: Secondary | ICD-10-CM | POA: Insufficient documentation

## 2018-08-29 DIAGNOSIS — I340 Nonrheumatic mitral (valve) insufficiency: Secondary | ICD-10-CM | POA: Insufficient documentation

## 2018-08-29 DIAGNOSIS — Z9841 Cataract extraction status, right eye: Secondary | ICD-10-CM | POA: Diagnosis not present

## 2018-08-29 DIAGNOSIS — M549 Dorsalgia, unspecified: Secondary | ICD-10-CM | POA: Insufficient documentation

## 2018-08-29 DIAGNOSIS — G4730 Sleep apnea, unspecified: Secondary | ICD-10-CM | POA: Insufficient documentation

## 2018-08-29 DIAGNOSIS — Z8711 Personal history of peptic ulcer disease: Secondary | ICD-10-CM | POA: Insufficient documentation

## 2018-08-29 DIAGNOSIS — E781 Pure hyperglyceridemia: Secondary | ICD-10-CM | POA: Diagnosis not present

## 2018-08-29 DIAGNOSIS — Z90710 Acquired absence of both cervix and uterus: Secondary | ICD-10-CM | POA: Insufficient documentation

## 2018-08-29 DIAGNOSIS — K5790 Diverticulosis of intestine, part unspecified, without perforation or abscess without bleeding: Secondary | ICD-10-CM | POA: Diagnosis not present

## 2018-08-29 HISTORY — PX: CLOSED REDUCTION NASAL FRACTURE: SHX5365

## 2018-08-29 HISTORY — DX: Sleep apnea, unspecified: G47.30

## 2018-08-29 SURGERY — CLOSED REDUCTION, FRACTURE, NASAL BONE
Anesthesia: General | Site: Nose

## 2018-08-29 MED ORDER — FENTANYL CITRATE (PF) 100 MCG/2ML IJ SOLN: 25.0000 ug | INTRAMUSCULAR | Status: DC | PRN

## 2018-08-29 MED ORDER — CEFAZOLIN SODIUM-DEXTROSE 2-4 GM/100ML-% IV SOLN
2.0000 g | INTRAVENOUS | Status: AC
  Administered 2018-08-29: 13:00:00 2 g via INTRAVENOUS

## 2018-08-29 MED ORDER — BACITRACIN ZINC 500 UNIT/GM EX OINT
TOPICAL_OINTMENT | CUTANEOUS | Status: DC | PRN
  Administered 2018-08-29: 1 via TOPICAL

## 2018-08-29 MED ORDER — FENTANYL CITRATE (PF) 100 MCG/2ML IJ SOLN
50.0000 ug | INTRAMUSCULAR | Status: DC | PRN
  Administered 2018-08-29: 50 ug via INTRAVENOUS

## 2018-08-29 MED ORDER — ACETAMINOPHEN 650 MG RE SUPP: 650.0000 mg | RECTAL | Status: DC | PRN

## 2018-08-29 MED ORDER — MEPERIDINE HCL 25 MG/ML IJ SOLN: 6.2500 mg | INTRAMUSCULAR | Status: DC | PRN

## 2018-08-29 MED ORDER — DEXAMETHASONE SODIUM PHOSPHATE 10 MG/ML IJ SOLN
INTRAMUSCULAR | Status: AC
  Filled 2018-08-29: qty 1

## 2018-08-29 MED ORDER — MIDAZOLAM HCL 2 MG/2ML IJ SOLN
INTRAMUSCULAR | Status: AC
  Filled 2018-08-29: qty 2

## 2018-08-29 MED ORDER — OXYMETAZOLINE HCL 0.05 % NA SOLN
NASAL | Status: AC
  Filled 2018-08-29: qty 30

## 2018-08-29 MED ORDER — METOCLOPRAMIDE HCL 5 MG/ML IJ SOLN: 10.0000 mg | Freq: Once | INTRAMUSCULAR | Status: DC | PRN

## 2018-08-29 MED ORDER — BACITRACIN ZINC 500 UNIT/GM EX OINT
TOPICAL_OINTMENT | CUTANEOUS | Status: AC
  Filled 2018-08-29: qty 28.35

## 2018-08-29 MED ORDER — ACETAMINOPHEN 325 MG PO TABS: 650.0000 mg | ORAL_TABLET | ORAL | Status: DC | PRN

## 2018-08-29 MED ORDER — LACTATED RINGERS IV SOLN
INTRAVENOUS | Status: DC
  Administered 2018-08-29: 12:00:00 via INTRAVENOUS

## 2018-08-29 MED ORDER — PROPOFOL 10 MG/ML IV BOLUS
INTRAVENOUS | Status: DC | PRN
  Administered 2018-08-29: 160 mg via INTRAVENOUS
  Administered 2018-08-29: 30 mg via INTRAVENOUS

## 2018-08-29 MED ORDER — FENTANYL CITRATE (PF) 100 MCG/2ML IJ SOLN
INTRAMUSCULAR | Status: AC
  Filled 2018-08-29: qty 2

## 2018-08-29 MED ORDER — LIDOCAINE 2% (20 MG/ML) 5 ML SYRINGE
INTRAMUSCULAR | Status: DC | PRN
  Administered 2018-08-29: 60 mg via INTRAVENOUS

## 2018-08-29 MED ORDER — OXYCODONE HCL 5 MG PO TABS: 5.0000 mg | ORAL_TABLET | ORAL | Status: DC | PRN

## 2018-08-29 MED ORDER — BACITRACIN ZINC 500 UNIT/GM EX OINT
TOPICAL_OINTMENT | CUTANEOUS | Status: AC
  Filled 2018-08-29: qty 0.9

## 2018-08-29 MED ORDER — LIDOCAINE-EPINEPHRINE 1 %-1:100000 IJ SOLN
INTRAMUSCULAR | Status: AC
  Filled 2018-08-29: qty 1

## 2018-08-29 MED ORDER — LIDOCAINE 2% (20 MG/ML) 5 ML SYRINGE
INTRAMUSCULAR | Status: AC
  Filled 2018-08-29: qty 5

## 2018-08-29 MED ORDER — ONDANSETRON HCL 4 MG/2ML IJ SOLN
INTRAMUSCULAR | Status: AC
  Filled 2018-08-29: qty 2

## 2018-08-29 MED ORDER — SODIUM CHLORIDE 0.9% FLUSH: 3.0000 mL | INTRAVENOUS | Status: DC | PRN

## 2018-08-29 MED ORDER — OXYMETAZOLINE HCL 0.05 % NA SOLN
NASAL | Status: DC | PRN
  Administered 2018-08-29: 1 via TOPICAL

## 2018-08-29 MED ORDER — MIDAZOLAM HCL 2 MG/2ML IJ SOLN
1.0000 mg | INTRAMUSCULAR | Status: DC | PRN
  Administered 2018-08-29: 13:00:00 2 mg via INTRAVENOUS

## 2018-08-29 MED ORDER — CHLORHEXIDINE GLUCONATE CLOTH 2 % EX PADS: 6.0000 | MEDICATED_PAD | Freq: Once | CUTANEOUS | Status: DC

## 2018-08-29 MED ORDER — SCOPOLAMINE 1 MG/3DAYS TD PT72: 1.0000 | MEDICATED_PATCH | Freq: Once | TRANSDERMAL | Status: DC

## 2018-08-29 MED ORDER — DEXAMETHASONE SODIUM PHOSPHATE 4 MG/ML IJ SOLN
INTRAMUSCULAR | Status: DC | PRN
  Administered 2018-08-29: 10 mg via INTRAVENOUS

## 2018-08-29 MED ORDER — BUPIVACAINE-EPINEPHRINE (PF) 0.25% -1:200000 IJ SOLN
INTRAMUSCULAR | Status: AC
  Filled 2018-08-29: qty 30

## 2018-08-29 MED ORDER — SODIUM CHLORIDE 0.9% FLUSH: 3.0000 mL | Freq: Two times a day (BID) | INTRAVENOUS | Status: DC

## 2018-08-29 MED ORDER — SUCCINYLCHOLINE CHLORIDE 200 MG/10ML IV SOSY
PREFILLED_SYRINGE | INTRAVENOUS | Status: AC
  Filled 2018-08-29: qty 10

## 2018-08-29 MED ORDER — SODIUM CHLORIDE 0.9 % IV SOLN: 250.0000 mL | INTRAVENOUS | Status: DC | PRN

## 2018-08-29 MED ORDER — LIDOCAINE-EPINEPHRINE 1 %-1:100000 IJ SOLN
INTRAMUSCULAR | Status: DC | PRN
  Administered 2018-08-29: 1 mL

## 2018-08-29 MED ORDER — CEFAZOLIN SODIUM-DEXTROSE 2-4 GM/100ML-% IV SOLN
INTRAVENOUS | Status: AC
  Filled 2018-08-29: qty 100

## 2018-08-29 MED ORDER — ONDANSETRON HCL 4 MG/2ML IJ SOLN
INTRAMUSCULAR | Status: DC | PRN
  Administered 2018-08-29: 4 mg via INTRAVENOUS

## 2018-08-29 SURGICAL SUPPLY — 45 items
APPLICATOR COTTON TIP 6 STRL (MISCELLANEOUS) ×1 IMPLANT
APPLICATOR COTTON TIP 6IN STRL (MISCELLANEOUS) ×2
BENZOIN TINCTURE PRP APPL 2/3 (GAUZE/BANDAGES/DRESSINGS) ×2 IMPLANT
BLADE SURG 15 STRL LF DISP TIS (BLADE) IMPLANT
BLADE SURG 15 STRL SS (BLADE)
CANISTER SUCT 1200ML W/VALVE (MISCELLANEOUS) ×2 IMPLANT
COVER BACK TABLE REUSABLE LG (DRAPES) ×2 IMPLANT
COVER MAYO STAND REUSABLE (DRAPES) ×2 IMPLANT
COVER WAND RF STERILE (DRAPES) IMPLANT
DECANTER SPIKE VIAL GLASS SM (MISCELLANEOUS) IMPLANT
DERMABOND ADVANCED (GAUZE/BANDAGES/DRESSINGS)
DERMABOND ADVANCED .7 DNX12 (GAUZE/BANDAGES/DRESSINGS) IMPLANT
DRSG NASOPORE 8CM (GAUZE/BANDAGES/DRESSINGS) IMPLANT
ELECT NEEDLE BLADE 2-5/6 (NEEDLE) IMPLANT
ELECT REM PT RETURN 9FT ADLT (ELECTROSURGICAL)
ELECTRODE REM PT RTRN 9FT ADLT (ELECTROSURGICAL) IMPLANT
GAUZE VASELINE FOILPK 1/2 X 72 (GAUZE/BANDAGES/DRESSINGS) IMPLANT
GLOVE BIO SURGEON STRL SZ 6.5 (GLOVE) ×4 IMPLANT
GLOVE BIO SURGEON STRL SZ7 (GLOVE) ×2 IMPLANT
GOWN STRL REUS W/ TWL LRG LVL3 (GOWN DISPOSABLE) ×3 IMPLANT
GOWN STRL REUS W/TWL LRG LVL3 (GOWN DISPOSABLE) ×3
KIT SPLINT NASAL DENVER LRG BE (GAUZE/BANDAGES/DRESSINGS) IMPLANT
KIT SPLINT NASAL DENVER MIN BE (GAUZE/BANDAGES/DRESSINGS) ×2 IMPLANT
KIT SPLINT NASAL DENVER PET BE (GAUZE/BANDAGES/DRESSINGS) IMPLANT
KIT SPLINT NASAL DENVER SM BEI (GAUZE/BANDAGES/DRESSINGS) IMPLANT
NEEDLE PRECISIONGLIDE 27X1.5 (NEEDLE) ×2 IMPLANT
PACK BASIN DAY SURGERY FS (CUSTOM PROCEDURE TRAY) ×2 IMPLANT
PATTIES SURGICAL .5 X3 (DISPOSABLE) ×2 IMPLANT
PENCIL BUTTON HOLSTER BLD 10FT (ELECTRODE) IMPLANT
SHEET MEDIUM DRAPE 40X70 STRL (DRAPES) ×2 IMPLANT
SLEEVE SCD COMPRESS KNEE MED (MISCELLANEOUS) ×2 IMPLANT
SPLINT NASAL AIRWAY SILICONE (MISCELLANEOUS) ×2 IMPLANT
SPLINT NASAL THERMO PLAST (MISCELLANEOUS) IMPLANT
SPONGE GAUZE 2X2 8PLY STRL LF (GAUZE/BANDAGES/DRESSINGS) IMPLANT
STRIP CLOSURE SKIN 1/2X4 (GAUZE/BANDAGES/DRESSINGS) IMPLANT
SUT CHROMIC 6 0 PS 4 (SUTURE) IMPLANT
SUT ETHILON 3 0 PS 1 (SUTURE) ×2 IMPLANT
SUT SILK 2 0 PERMA HAND 18 BK (SUTURE) IMPLANT
SUT SILK 3 0 SH 30 (SUTURE) IMPLANT
SYR CONTROL 10ML LL (SYRINGE) ×2 IMPLANT
TOWEL GREEN STERILE FF (TOWEL DISPOSABLE) ×2 IMPLANT
TRAY DSU PREP LF (CUSTOM PROCEDURE TRAY) ×2 IMPLANT
TUBE CONNECTING 20X1/4 (TUBING) ×2 IMPLANT
TUBE SALEM SUMP 16 FR W/ARV (TUBING) ×2 IMPLANT
YANKAUER SUCT BULB TIP NO VENT (SUCTIONS) IMPLANT

## 2018-08-29 NOTE — Op Note (Signed)
Operative Note   DATE OF OPERATION: 08/29/2018  LOCATION:  [facility]   SURGICAL DIVISION: Plastic Surgery  PREOPERATIVE DIAGNOSES:  Nasal fracture  POSTOPERATIVE DIAGNOSES:  same  PROCEDURE:  Closed Nasal fracture reduction  SURGEON: Karthikeya Funke Sanger Zemira Zehring, DO  ASSISTANT: Roetta Sessions, PA  ANESTHESIA:  General.   COMPLICATIONS: None.   INDICATIONS FOR PROCEDURE:  The patient, Beverly [name] is a 73 y.o. female born on [birth year], is here for treatment of a nasal fracture. MRN: [id number]  CONSENT:  Informed consent was obtained directly from the patient. Risks, benefits and alternatives were fully discussed. Specific risks including but not limited to bleeding, infection, hematoma, seroma, scarring, pain, infection, contracture, asymmetry, wound healing problems, and need for further surgery were all discussed. The patient did have an ample opportunity to have questions answered to satisfaction.   DESCRIPTION OF PROCEDURE:   The patient was taken to the operating room. SCDs were placed and IV antibiotics were given. The patient's operative site was prepped and draped in a sterile fashion. A time out was performed and all information was confirmed to be correct.  General anesthesia was administered.  The afrin soaked pledgets were placed in the nose.  The septum was injected with local.  After waiting several minutes for the vasoconstriction the speculum was placed in the nose and the septum was realigned to the left.  The nasal fracture was reset to the left.  The septal splints were placed and secured with a 3-0 Nylon.  The steri strips were applied.  The nasal splint was placed.  The posterior pharynx was suctioned to remove the blood.  The patient tolerated the procedure well.  There were no complications. The patient was allowed to wake from anesthesia, extubated and taken to the recovery room in satisfactory condition.   The advanced practice  practitioner (APP) assisted throughout the case.  The APP was essential in retraction and counter traction when needed to make the case progress smoothly.  This retraction and assistance made it possible to see the tissue plans for the procedure.  The assistance was needed for blood control, tissue re-approximation and assisted with closure of the incision site.

## 2018-08-29 NOTE — Transfer of Care (Signed)
Immediate Anesthesia Transfer of Care Note  Patient: KASSIDI ELZA  Procedure(s) Performed: CLOSED REDUCTION NASAL FRACTURE (N/A Nose)  Patient Location: PACU  Anesthesia Type:General  Level of Consciousness: drowsy  Airway & Oxygen Therapy: Patient Spontanous Breathing and Patient connected to nasal cannula oxygen  Post-op Assessment: Report given to RN and Post -op Vital signs reviewed and stable  Post vital signs: Reviewed and stable  Last Vitals:  Vitals Value Taken Time  BP    Temp    Pulse 55 08/29/18 1330  Resp 13 08/29/18 1330  SpO2 100 % 08/29/18 1330    Last Pain:  Vitals:   08/29/18 1147  TempSrc: Oral  PainSc: 0-No pain         Complications: No apparent anesthesia complications

## 2018-08-29 NOTE — Anesthesia Procedure Notes (Signed)
Procedure Name: LMA Insertion Date/Time: 08/29/2018 12:52 PM Performed by: Lieutenant Diego, CRNA Pre-anesthesia Checklist: Patient identified, Emergency Drugs available, Suction available and Patient being monitored Patient Re-evaluated:Patient Re-evaluated prior to induction Oxygen Delivery Method: Circle system utilized Preoxygenation: Pre-oxygenation with 100% oxygen Induction Type: IV induction Ventilation: Mask ventilation without difficulty LMA: LMA flexible inserted LMA Size: 4.0 Number of attempts: 1 Placement Confirmation: positive ETCO2 and breath sounds checked- equal and bilateral Tube secured with: Tape Dental Injury: Teeth and Oropharynx as per pre-operative assessment

## 2018-08-29 NOTE — Interval H&P Note (Signed)
History and Physical Interval Note:  08/29/2018 12:21 PM  Beverly [name]  has presented today for surgery, with the diagnosis of Closed fracture of nasal bone.  The various methods of treatment have been discussed with the patient and family. After consideration of risks, benefits and other options for treatment, the patient has consented to  Procedure(s) with comments: CLOSED REDUCTION NASAL FRACTURE (N/A) - 1 hour, please as a surgical intervention.  The patient's history has been reviewed, patient examined, no change in status, stable for surgery.  I have reviewed the patient's chart and labs.  Questions were answered to the patient's satisfaction.     Loel Lofty Dillingham

## 2018-08-29 NOTE — Discharge Instructions (Signed)
No heavy lifting. May shower tomorrow but don't get splint wet until Thursday. Sleep with head of bed elevated for three days. Lots of cold drinks and ice water.    Post Anesthesia Home Care Instructions  Activity: Get plenty of rest for the remainder of the day. A responsible individual must stay with you for 24 hours following the procedure.  For the next 24 hours, DO NOT: -Drive a car -Paediatric nurse -Drink alcoholic beverages -Take any medication unless instructed by your physician -Make any legal decisions or sign important papers.  Meals: Start with liquid foods such as gelatin or soup. Progress to regular foods as tolerated. Avoid greasy, spicy, heavy foods. If nausea and/or vomiting occur, drink only clear liquids until the nausea and/or vomiting subsides. Call your physician if vomiting continues.  Special Instructions/Symptoms: Your throat may feel dry or sore from the anesthesia or the breathing tube placed in your throat during surgery. If this causes discomfort, gargle with warm salt water. The discomfort should disappear within 24 hours.  If you had a scopolamine patch placed behind your ear for the management of post- operative nausea and/or vomiting:  1. The medication in the patch is effective for 72 hours, after which it should be removed.  Wrap patch in a tissue and discard in the trash. Wash hands thoroughly with soap and water. 2. You may remove the patch earlier than 72 hours if you experience unpleasant side effects which may include dry mouth, dizziness or visual disturbances. 3. Avoid touching the patch. Wash your hands with soap and water after contact with the patch.

## 2018-08-29 NOTE — Anesthesia Preprocedure Evaluation (Signed)
Anesthesia Evaluation  Patient identified by MRN, date of birth, ID band Patient awake    Reviewed: Allergy & Precautions, NPO status , Patient's Chart, lab work & pertinent test results  Airway Mallampati: II  TM Distance: >3 FB Neck ROM: Full    Dental no notable dental hx.    Pulmonary asthma , sleep apnea ,    Pulmonary exam normal breath sounds clear to auscultation       Cardiovascular negative cardio ROS Normal cardiovascular exam Rhythm:Regular Rate:Normal     Neuro/Psych negative neurological ROS  negative psych ROS   GI/Hepatic Neg liver ROS, GERD  Controlled,  Endo/Other  negative endocrine ROS  Renal/GU negative Renal ROS  negative genitourinary   Musculoskeletal negative musculoskeletal ROS (+)   Abdominal   Peds negative pediatric ROS (+)  Hematology negative hematology ROS (+)   Anesthesia Other Findings   Reproductive/Obstetrics negative OB ROS                             Anesthesia Physical Anesthesia Plan  ASA: II  Anesthesia Plan: General   Post-op Pain Management:    Induction: Intravenous  PONV Risk Score and Plan: 3 and Ondansetron, Dexamethasone and Treatment may vary due to age or medical condition  Airway Management Planned: LMA  Additional Equipment:   Intra-op Plan:   Post-operative Plan: Extubation in OR  Informed Consent: I have reviewed the patients History and Physical, chart, labs and discussed the procedure including the risks, benefits and alternatives for the proposed anesthesia with the patient or authorized representative who has indicated his/her understanding and acceptance.     Dental advisory given  Plan Discussed with: CRNA  Anesthesia Plan Comments:         Anesthesia Quick Evaluation

## 2018-08-29 NOTE — Anesthesia Postprocedure Evaluation (Signed)
Anesthesia Post Note  Patient: Beverly [name]  Procedure(s) Performed: CLOSED REDUCTION NASAL FRACTURE (N/A Nose)     Patient location during evaluation: PACU Anesthesia Type: General Level of consciousness: awake and alert Pain management: pain level controlled Vital Signs Assessment: post-procedure vital signs reviewed and stable Respiratory status: spontaneous breathing, nonlabored ventilation, respiratory function stable and patient connected to nasal cannula oxygen Cardiovascular status: blood pressure returned to baseline and stable Postop Assessment: no apparent nausea or vomiting Anesthetic complications: no    Last Vitals:  Vitals:   08/29/18 1345 08/29/18 1400  BP: (!) 160/73   Pulse: 78 60  Resp: 13 18  Temp:    SpO2: 95% 100%    Last Pain:  Vitals:   08/29/18 1400  TempSrc:   PainSc: 0-No pain                 [name]

## 2018-08-30 ENCOUNTER — Encounter (HOSPITAL_BASED_OUTPATIENT_CLINIC_OR_DEPARTMENT_OTHER): Payer: Self-pay | Admitting: Plastic Surgery

## 2018-08-31 ENCOUNTER — Encounter: Payer: Self-pay | Admitting: Plastic Surgery

## 2018-09-01 MED FILL — ESTRADIOL 0.5 MG TABS: 0.5 | 90 days supply | Qty: 90 | Fill #2

## 2018-09-02 ENCOUNTER — Ambulatory Visit (INDEPENDENT_AMBULATORY_CARE_PROVIDER_SITE_OTHER): Payer: Medicare Other | Admitting: Surgical

## 2018-09-02 ENCOUNTER — Other Ambulatory Visit: Payer: Self-pay

## 2018-09-02 ENCOUNTER — Encounter: Payer: Self-pay | Admitting: Surgical

## 2018-09-02 VITALS — BP 125/72 | HR 59 | Temp 96.9°F | Ht 65.0 in | Wt 168.2 lb

## 2018-09-02 DIAGNOSIS — S022XXD Fracture of nasal bones, subsequent encounter for fracture with routine healing: Secondary | ICD-10-CM

## 2018-09-02 NOTE — Progress Notes (Signed)
Beverly [name] is here for follow-up after closed nasal fracture reduction on 08/29/2018.  She is doing very well.  She has had no difficulty breathing, and has noted an improvement since the reduction.  Today in the office removed her nasal splints.  There is no bruising, or active bleeding.  She is scheduled to follow-up late next week.  Carola Rhine Taevin Mcferran, PA-C Signed at 1:34 PM

## 2018-09-05 ENCOUNTER — Encounter: Payer: Medicare Other | Admitting: Plastic Surgery

## 2018-09-08 NOTE — Progress Notes (Signed)
Subjective:     Patient ID: Beverly [name], female    DOB: 05-Nov-1945, 73 y.o.   MRN: [id number]  Chief Complaint  Patient presents with  . Follow-up    HPI: The patient is a 73 y.o. female here for follow-up after closed nasal fracture reduction on 08/29/2018 with Dr. Marla Roe.  She was last seen on 09/02/2018 and her splints were removed.  At that time she was doing very well.  She did call earlier this week saying that the splint on her nose had fallen off.  Beverly [name] is doing very well.  She reports that her breathing has significantly improved.  There is no bruising noted on exam.  She does have a slight deviation to the left, but this is not abnormal for her and she has had this for as long as she can remember.  She did have some questions about her brow lift due to restriction in her vision.  She did have a upper lid blepharoplasty approximately a year ago at [facility].  Review of Systems  Constitutional: Negative.   HENT: Negative.   Eyes: Negative for blurred vision, double vision and photophobia.       Decreased visual field   Respiratory: Negative.   Cardiovascular: Negative.   Musculoskeletal: Negative.   Skin: Negative for itching and rash.       + lesions   Neurological: Negative.      Objective:   Vital Signs BP (!) 148/93 (BP Location: Left Arm, Patient Position: Sitting)   Pulse 63   Temp (!) 96.8 F (36 C)   Wt 166 lb 12.8 oz (75.7 kg)   SpO2 96%   BMI 27.76 kg/m  Vital Signs and Nursing Note Reviewed  Physical Exam  Constitutional: She is oriented to person, place, and time and well-developed, well-nourished, and in no distress. No distress.  HENT:  Head: Normocephalic and atraumatic.    Eyes: Pupils are equal, round, and reactive to light.  Significant brow ptosis noted.  Neck: Normal range of motion.  Cardiovascular: Normal rate.  Pulmonary/Chest: Effort normal.  Musculoskeletal: Normal range of motion.  Neurological: She is  alert and oriented to person, place, and time. Gait normal.  Skin: Skin is warm and dry. Rash noted. She is not diaphoretic. No erythema. No pallor.     Psychiatric: Mood and affect normal.      Assessment/Plan:     ICD-10-CM   1. Closed fracture of nasal bone with routine healing, subsequent encounter  S02.2XXD    Beverly [name] is doing very well.  She has significant improvement in her breathing.  She has no noticeable bruising.  Her splint did fall off yesterday at home. Continue to sleep on her back or avoid sleeping where she is directly putting pressure on her nose.  Continue to be very careful to avoid bumping her nose or injuring it further.  She does have a lot of skin lesions on her bilateral hands arms and one in particular on her left cheek.  She noted that she sees her dermatologist regularly for this because she has a history of squamous cell carcinoma.  She is very aware that she should get these examined.  She reports that they have been and and she continues to receive ongoing medical therapy for this.  In regards to her brow lift, I will reach out to our surgery scheduler in regards to insurance.  She does have some decreased visual fields due to the significant ptosis.  Carola Rhine Ninoshka Wainwright, PA-C 09/09/2018, 8:20 AM

## 2018-09-09 ENCOUNTER — Ambulatory Visit (INDEPENDENT_AMBULATORY_CARE_PROVIDER_SITE_OTHER): Payer: Medicare Other | Admitting: Surgical

## 2018-09-09 ENCOUNTER — Other Ambulatory Visit: Payer: Self-pay

## 2018-09-09 ENCOUNTER — Encounter: Payer: Self-pay | Admitting: Surgical

## 2018-09-09 VITALS — BP 148/93 | HR 63 | Temp 96.8°F | Wt 166.8 lb

## 2018-09-09 DIAGNOSIS — S022XXD Fracture of nasal bones, subsequent encounter for fracture with routine healing: Secondary | ICD-10-CM

## 2018-10-04 ENCOUNTER — Encounter (HOSPITAL_BASED_OUTPATIENT_CLINIC_OR_DEPARTMENT_OTHER): Payer: Self-pay | Admitting: *Deleted

## 2018-10-04 ENCOUNTER — Other Ambulatory Visit: Payer: Self-pay

## 2018-10-06 ENCOUNTER — Other Ambulatory Visit (HOSPITAL_COMMUNITY)
Admission: RE | Admit: 2018-10-06 | Discharge: 2018-10-06 | Disposition: A | Discharge status: Home / Self Care | Payer: Medicare Other | Source: Ambulatory Visit | Attending: Plastic Surgery | Admitting: Plastic Surgery

## 2018-10-06 DIAGNOSIS — Z20828 Contact with and (suspected) exposure to other viral communicable diseases: Secondary | ICD-10-CM | POA: Insufficient documentation

## 2018-10-06 DIAGNOSIS — Z01812 Encounter for preprocedural laboratory examination: Secondary | ICD-10-CM | POA: Insufficient documentation

## 2018-10-06 LAB — SARS CORONAVIRUS 2 (TAT 6-24 HRS): SARS Coronavirus 2: NEGATIVE

## 2018-10-06 NOTE — Progress Notes (Signed)
Patient ID: Beverly [name], female    DOB: August 24, 1945, 73 y.o.   MRN: [id number]  Chief Complaint  Patient presents with  . Pre-op Exam      ICD-10-CM   1. Brow ptosis  H57.819      History of Present Illness: Beverly [name] is a 73 y.o.  female  with a history of brow ptosis interfering with her visual fields.  She presents for preoperative evaluation for upcoming procedure, brow lift, scheduled for 10/10/18 with Dr. Marla Roe.  The patient has not had problems with anesthesia. She recently underwent a close reduction of a nasal fracture. She tolerated the procedure well.  She has a medical history of asthma, HLD, amaurosis fugax.   She has not had any recent illnesses or colds. She has had her covid test and is quarantining at home. No complaints. No history of dvt/pe, tia, mi. She is generally healthy.  Past Medical History: Allergies: Allergies  Allergen Reactions  . Adhesive [Tape] Rash  . Codeine Rash    Current Medications:  Current Outpatient Medications:  .  acetaminophen (TYLENOL) 500 MG tablet, Take 1,000 mg by mouth as needed. Reported on 05/15/2015, Disp: , Rfl:  .  albuterol (PROVENTIL HFA;VENTOLIN HFA) 108 (90 Base) MCG/ACT inhaler, Inhale 2 puffs into the lungs every 6 (six) hours as needed for wheezing or shortness of breath., Disp: 18 g, Rfl: 1 .  aspirin EC 81 MG tablet, Take 81 mg by mouth daily., Disp: , Rfl:  .  atorvastatin (LIPITOR) 40 MG tablet, Take 1 tablet (40 mg total) by mouth daily., Disp: 90 tablet, Rfl: 1 .  Azelastine HCl 137 MCG/SPRAY SOLN, Place 2 Pump into both nostrils 2 (two) times a day., Disp: 90 mL, Rfl: 3 .  Calcium Carbonate-Vitamin D (CALCIUM 600+D) 600-200 MG-UNIT TABS, Take 1 tablet by mouth 2 (two) times daily.  , Disp: , Rfl:  .  celecoxib (CELEBREX) 200 MG capsule, Take 1 capsule (200 mg total) by mouth 2 (two) times daily., Disp: 180 capsule, Rfl: 1 .  cephALEXin (KEFLEX) 500 MG capsule, Take 1 capsule (500 mg total)  by mouth 2 (two) times daily for 3 days., Disp: 6 capsule, Rfl: 0 .  cetirizine (ZYRTEC) 10 MG tablet, Take 10 mg by mouth daily.  , Disp: , Rfl:  .  Dextromethorphan-guaiFENesin (GUAIFENESIN DM PO), Take by mouth., Disp: , Rfl:  .  esomeprazole (NEXIUM) 40 MG capsule, TAKE 1 CAPSULE BY MOUTH DAILY BEFORE SUPPER., Disp: 90 capsule, Rfl: 1 .  estradiol (ESTRACE) 0.5 MG tablet, TAKE 1 TABLET BY MOUTH ONCE DAILY, Disp: 90 tablet, Rfl: 3 .  fentaNYL (DURAGESIC - DOSED MCG/HR) 25 MCG/HR, Place 1 patch onto the skin every 3 (three) days.  , Disp: , Rfl:  .  Fluorouracil (TOLAK) 4 % CREA, Apply topically every 3 (three) days., Disp: , Rfl:  .  fluticasone (FLONASE) 50 MCG/ACT nasal spray, PLACE 2 SPRAYS INTO THE NOSE DAILY., Disp: 48 g, Rfl: 3 .  fluticasone (FLOVENT HFA) 220 MCG/ACT inhaler, INHALE 2 PUFFS INTO THE LUNGS 2 (TWO) TIMES DAILY., Disp: 36 g, Rfl: 3 .  gabapentin (NEURONTIN) 800 MG tablet, Take 800 mg by mouth at bedtime. , Disp: , Rfl:  .  loperamide (IMODIUM A-D) 2 MG tablet, Take 2 mg by mouth 4 (four) times daily as needed for diarrhea or loose stools., Disp: , Rfl:  .  methocarbamol (ROBAXIN) 500 MG tablet, Take 500 mg by mouth daily with breakfast., Disp: ,  Rfl:  .  montelukast (SINGULAIR) 10 MG tablet, Take 1 tablet (10 mg total) by mouth at bedtime., Disp: 90 tablet, Rfl: 3 .  Multiple Vitamins-Minerals (MULTIVITAMIN WITH MINERALS) tablet, Take 1 tablet by mouth daily., Disp: , Rfl:  .  omega-3 acid ethyl esters (LOVAZA) 1 g capsule, TAKE 2 CAPSULES BY MOUTH 2 TIMES DAILY., Disp: 360 capsule, Rfl: 0 .  ondansetron (ZOFRAN) 4 MG tablet, Take 1 tablet (4 mg total) by mouth every 8 (eight) hours as needed for nausea or vomiting., Disp: 20 tablet, Rfl: 0 .  PARoxetine (PAXIL) 30 MG tablet, Take 40 mg by mouth daily. , Disp: , Rfl:  .  pseudoephedrine (SUDAFED) 30 MG tablet, Take 30 mg by mouth every 4 (four) hours as needed for congestion., Disp: , Rfl:   Past Medical Problems: Past  Medical History:  Diagnosis Date  . Allergy   . Amaurosis fugax    negative w/u through [facility]  . Anxiety   . Asthma   . Back pain    Dr Joline Salt 02/2010-epidural injection x 2 at L4-5 with good effect  . BCC (basal cell carcinoma of skin)    Dr. Denna Haggard  . Cataract    left  . Chronic pain   . Depression   . Diverticulosis of colon 1998   mild  . DJD (degenerative joint disease)   . Duodenal ulcer    h/o  . GERD (gastroesophageal reflux disease)   . History of SCC (squamous cell carcinoma) of skin    Dr. Denna Haggard  . History of sinus bradycardia   . HLD (hyperlipidemia)    hypertriglyceridemia  . Hypertensive retinopathy of both eyes 11/18/2017  . IBS (irritable bowel syndrome)   . Internal hemorrhoids 1998  . Mitral regurgitation    mild  . Ocular migraine 10/31/14   Dr.Hecker  . Osteoarthritis    feet,shoulder,neck,back,hips and hands.  . Sleep apnea    uses a mouth guard nightly  . Vitamin D deficiency    mild    Past Surgical History: Past Surgical History:  Procedure Laterality Date  . BLEPHAROPLASTY Bilateral 01/2018  . BUNIONECTOMY     R 12/08, L 2004 (Dr. Janus Molder)  . CARPAL TUNNEL RELEASE  1989   bilateral  . CATARACT EXTRACTION Bilateral Left in 08/2012, Right 04/2016   Dr.Hecker  . CESAREAN SECTION     x2  . CHOLECYSTECTOMY  1992  . CLOSED REDUCTION NASAL FRACTURE N/A 08/29/2018   Procedure: CLOSED REDUCTION NASAL FRACTURE;  Surgeon: Wallace Going, DO;  Location: [facility];  Service: Plastics;  Laterality: N/A;  1 hour, please  . COLONOSCOPY  2006  . COLONOSCOPY  01/2012   due again 01/2022; mild diverticulosis  . epidural steroid injection, back  02/2010  . HIP SURGERY     right bursectomy x 3  . HIP SURGERY Right 2000   torn cartilage, repaired  . INGUINAL HERNIA REPAIR  9/09   bilat  . NECK SURGERY  1989   c6-7 cervical laminectomy and diskecotmy  . SHOULDER SURGERY  2/05   left rotator cuff repair  . SHOULDER  SURGERY Left 03/14/2018   rotator cuff repair; Dr. Tonita Cong  . TONSILLECTOMY  age 8  . TOTAL ABDOMINAL HYSTERECTOMY W/ BILATERAL SALPINGOOPHORECTOMY  1997   fibroids  . TROCHANTERIC BURSA EXCISION Right 1978  . TUBAL LIGATION  1986  . WISDOM TOOTH EXTRACTION      Social History: Social History   Socioeconomic History  . Marital  status: Divorced    Spouse name: Not on file  . Number of children: 2  . Years of education: Not on file  . Highest education level: Not on file  Occupational History  . Occupation: STAFF Optician, dispensing: [employer]  Social Needs  . Financial resource strain: Not on file  . Food insecurity    Worry: Not on file    Inability: Not on file  . Transportation needs    Medical: Not on file    Non-medical: Not on file  Tobacco Use  . Smoking status: Never Smoker  . Smokeless tobacco: Never Used  Substance and Sexual Activity  . Alcohol use: Yes    Alcohol/week: 7.0 standard drinks    Types: 7 Glasses of wine per week    Comment: 1 glass of wine daily  . Drug use: No  . Sexual activity: Not Currently  Lifestyle  . Physical activity    Days per week: Not on file    Minutes per session: Not on file  . Stress: Not on file  Relationships  . Social Herbalist on phone: Not on file    Gets together: Not on file    Attends religious service: Not on file    Active member of club or organization: Not on file    Attends meetings of clubs or organizations: Not on file    Relationship status: Not on file  . Intimate partner violence    Fear of current or ex partner: Not on file    Emotionally abused: Not on file    Physically abused: Not on file    Forced sexual activity: Not on file  Other Topics Concern  . 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   [place] for [employer] part-time    Family History: Family History  Problem Relation Age of Onset  . Depression Mother   . Schizophrenia Mother   . Cancer Father        oral  . Heart disease Father        bradycardia  . Cancer Sister        skin and lung  . COPD Sister   . Hypertension Sister   . Osteoporosis Sister   . Hyperthyroidism Brother   . Cancer Brother        metastatic cancer to bone (?primary)  . Hyperlipidemia Daughter   . Asthma Son   . Cancer Paternal Grandmother 65       colon cancer, metastatic to liver  . Colon cancer Paternal Grandmother   . Diabetes Maternal Grandmother   . ADD / ADHD Other     Review of Systems: Review of Systems  Constitutional: Negative.   HENT: Negative for ear discharge, ear pain, sinus pain and sore throat.        + vision changes (decreased visual fields)   Respiratory: Negative.   Cardiovascular: Negative.   Musculoskeletal: Negative.   Skin: Negative.   Neurological: Negative.   Psychiatric/Behavioral: Negative.     Physical Exam: Vital Signs BP (!) 147/75 (BP Location: Left Arm, Patient Position: Sitting, Cuff Size: Normal)   Pulse 69   Temp (!) 97.1 F (36.2 C) (Temporal)   Ht 5\' 5"  (1.651 m)   Wt 165 lb (74.8 kg)  SpO2 97%   BMI 27.46 kg/m   Physical Exam Exam conducted with a chaperone present.  Constitutional:      General: She is not in acute distress.    Appearance: Normal appearance. She is not ill-appearing.  HENT:     Head: Normocephalic and atraumatic. Brow ptosis noted. Eyes:     Pupils: Pupils are equal, round Neck:     Musculoskeletal: Normal range of motion.  Cardiovascular:     Rate and Rhythm: Normal rate and regular rhythm.     Pulses: Normal pulses.     Heart sounds: Normal heart sounds. No murmur.  Pulmonary:     Effort: Pulmonary effort is normal. No respiratory distress.     Breath sounds: Normal breath sounds. No wheezing.  Abdominal:     General:  Abdomen is flat. There is no distension.     Palpations: Abdomen is soft.     Tenderness: There is no abdominal tenderness.  Musculoskeletal: Normal range of motion.  Skin:    General: Skin is warm and dry.     Findings: No erythema or rash.  Neurological:     General: No focal deficit present.     Mental Status: She is alert and oriented to person, place, and time. Mental status is at baseline.     Motor: No weakness.  Psychiatric:        Mood and Affect: Mood normal.        Behavior: Behavior normal.    Assessment/Plan: Mrs. Stahle is scheduled for brow lift with Dr. Marla Roe on 10/10/18. Benefits, and alternatives of procedure discussed, questions answered and consent obtained.    The consent was obtained with risks and complications reviewed which included bleeding, pain, scar, infection and the risk of anesthesia.  The patients questions were answered to the patients expressed satisfaction.   Prescriptions sent to pharmacy.  She has had her covid test and is doing well. No concerns or complaints. She has stopped her aspirin, celebrex.  Electronically signed by: Carola Rhine Satya Buttram, PA-C 10/07/2018 9:56 AM

## 2018-10-06 NOTE — H&P (View-Only) (Signed)
Patient ID: Beverly [name], female    DOB: May 27, 1945, 73 y.o.   MRN: [id number]  Chief Complaint  Patient presents with  . Pre-op Exam      ICD-10-CM   1. Brow ptosis  H57.819      History of Present Illness: Beverly [name] is a 73 y.o.  female  with a history of brow ptosis interfering with her visual fields.  She presents for preoperative evaluation for upcoming procedure, brow lift, scheduled for 10/10/18 with Dr. Marla Roe.  The patient has not had problems with anesthesia. She recently underwent a close reduction of a nasal fracture. She tolerated the procedure well.  She has a medical history of asthma, HLD, amaurosis fugax.   She has not had any recent illnesses or colds. She has had her covid test and is quarantining at home. No complaints. No history of dvt/pe, tia, mi. She is generally healthy.  Past Medical History: Allergies: Allergies  Allergen Reactions  . Adhesive [Tape] Rash  . Codeine Rash    Current Medications:  Current Outpatient Medications:  .  acetaminophen (TYLENOL) 500 MG tablet, Take 1,000 mg by mouth as needed. Reported on 05/15/2015, Disp: , Rfl:  .  albuterol (PROVENTIL HFA;VENTOLIN HFA) 108 (90 Base) MCG/ACT inhaler, Inhale 2 puffs into the lungs every 6 (six) hours as needed for wheezing or shortness of breath., Disp: 18 g, Rfl: 1 .  aspirin EC 81 MG tablet, Take 81 mg by mouth daily., Disp: , Rfl:  .  atorvastatin (LIPITOR) 40 MG tablet, Take 1 tablet (40 mg total) by mouth daily., Disp: 90 tablet, Rfl: 1 .  Azelastine HCl 137 MCG/SPRAY SOLN, Place 2 Pump into both nostrils 2 (two) times a day., Disp: 90 mL, Rfl: 3 .  Calcium Carbonate-Vitamin D (CALCIUM 600+D) 600-200 MG-UNIT TABS, Take 1 tablet by mouth 2 (two) times daily.  , Disp: , Rfl:  .  celecoxib (CELEBREX) 200 MG capsule, Take 1 capsule (200 mg total) by mouth 2 (two) times daily., Disp: 180 capsule, Rfl: 1 .  cephALEXin (KEFLEX) 500 MG capsule, Take 1 capsule (500 mg total)  by mouth 2 (two) times daily for 3 days., Disp: 6 capsule, Rfl: 0 .  cetirizine (ZYRTEC) 10 MG tablet, Take 10 mg by mouth daily.  , Disp: , Rfl:  .  Dextromethorphan-guaiFENesin (GUAIFENESIN DM PO), Take by mouth., Disp: , Rfl:  .  esomeprazole (NEXIUM) 40 MG capsule, TAKE 1 CAPSULE BY MOUTH DAILY BEFORE SUPPER., Disp: 90 capsule, Rfl: 1 .  estradiol (ESTRACE) 0.5 MG tablet, TAKE 1 TABLET BY MOUTH ONCE DAILY, Disp: 90 tablet, Rfl: 3 .  fentaNYL (DURAGESIC - DOSED MCG/HR) 25 MCG/HR, Place 1 patch onto the skin every 3 (three) days.  , Disp: , Rfl:  .  Fluorouracil (TOLAK) 4 % CREA, Apply topically every 3 (three) days., Disp: , Rfl:  .  fluticasone (FLONASE) 50 MCG/ACT nasal spray, PLACE 2 SPRAYS INTO THE NOSE DAILY., Disp: 48 g, Rfl: 3 .  fluticasone (FLOVENT HFA) 220 MCG/ACT inhaler, INHALE 2 PUFFS INTO THE LUNGS 2 (TWO) TIMES DAILY., Disp: 36 g, Rfl: 3 .  gabapentin (NEURONTIN) 800 MG tablet, Take 800 mg by mouth at bedtime. , Disp: , Rfl:  .  loperamide (IMODIUM A-D) 2 MG tablet, Take 2 mg by mouth 4 (four) times daily as needed for diarrhea or loose stools., Disp: , Rfl:  .  methocarbamol (ROBAXIN) 500 MG tablet, Take 500 mg by mouth daily with breakfast., Disp: ,  Rfl:  .  montelukast (SINGULAIR) 10 MG tablet, Take 1 tablet (10 mg total) by mouth at bedtime., Disp: 90 tablet, Rfl: 3 .  Multiple Vitamins-Minerals (MULTIVITAMIN WITH MINERALS) tablet, Take 1 tablet by mouth daily., Disp: , Rfl:  .  omega-3 acid ethyl esters (LOVAZA) 1 g capsule, TAKE 2 CAPSULES BY MOUTH 2 TIMES DAILY., Disp: 360 capsule, Rfl: 0 .  ondansetron (ZOFRAN) 4 MG tablet, Take 1 tablet (4 mg total) by mouth every 8 (eight) hours as needed for nausea or vomiting., Disp: 20 tablet, Rfl: 0 .  PARoxetine (PAXIL) 30 MG tablet, Take 40 mg by mouth daily. , Disp: , Rfl:  .  pseudoephedrine (SUDAFED) 30 MG tablet, Take 30 mg by mouth every 4 (four) hours as needed for congestion., Disp: , Rfl:   Past Medical Problems: Past  Medical History:  Diagnosis Date  . Allergy   . Amaurosis fugax    negative w/u through [facility]  . Anxiety   . Asthma   . Back pain    Dr Joline Salt 02/2010-epidural injection x 2 at L4-5 with good effect  . BCC (basal cell carcinoma of skin)    Dr. Denna Haggard  . Cataract    left  . Chronic pain   . Depression   . Diverticulosis of colon 1998   mild  . DJD (degenerative joint disease)   . Duodenal ulcer    h/o  . GERD (gastroesophageal reflux disease)   . History of SCC (squamous cell carcinoma) of skin    Dr. Denna Haggard  . History of sinus bradycardia   . HLD (hyperlipidemia)    hypertriglyceridemia  . Hypertensive retinopathy of both eyes 11/18/2017  . IBS (irritable bowel syndrome)   . Internal hemorrhoids 1998  . Mitral regurgitation    mild  . Ocular migraine 10/31/14   Dr.Hecker  . Osteoarthritis    feet,shoulder,neck,back,hips and hands.  . Sleep apnea    uses a mouth guard nightly  . Vitamin D deficiency    mild    Past Surgical History: Past Surgical History:  Procedure Laterality Date  . BLEPHAROPLASTY Bilateral 01/2018  . BUNIONECTOMY     R 12/08, L 2004 (Dr. Janus Molder)  . CARPAL TUNNEL RELEASE  1989   bilateral  . CATARACT EXTRACTION Bilateral Left in 08/2012, Right 04/2016   Dr.Hecker  . CESAREAN SECTION     x2  . CHOLECYSTECTOMY  1992  . CLOSED REDUCTION NASAL FRACTURE N/A 08/29/2018   Procedure: CLOSED REDUCTION NASAL FRACTURE;  Surgeon: Wallace Going, DO;  Location: [facility];  Service: Plastics;  Laterality: N/A;  1 hour, please  . COLONOSCOPY  2006  . COLONOSCOPY  01/2012   due again 01/2022; mild diverticulosis  . epidural steroid injection, back  02/2010  . HIP SURGERY     right bursectomy x 3  . HIP SURGERY Right 2000   torn cartilage, repaired  . INGUINAL HERNIA REPAIR  9/09   bilat  . NECK SURGERY  1989   c6-7 cervical laminectomy and diskecotmy  . SHOULDER SURGERY  2/05   left rotator cuff repair  . SHOULDER  SURGERY Left 03/14/2018   rotator cuff repair; Dr. Tonita Cong  . TONSILLECTOMY  age 33  . TOTAL ABDOMINAL HYSTERECTOMY W/ BILATERAL SALPINGOOPHORECTOMY  1997   fibroids  . TROCHANTERIC BURSA EXCISION Right 1978  . TUBAL LIGATION  1986  . WISDOM TOOTH EXTRACTION      Social History: Social History   Socioeconomic History  . Marital  status: Divorced    Spouse name: Not on file  . Number of children: 2  . Years of education: Not on file  . Highest education level: Not on file  Occupational History  . Occupation: STAFF Optician, dispensing: [employer]  Social Needs  . Financial resource strain: Not on file  . Food insecurity    Worry: Not on file    Inability: Not on file  . Transportation needs    Medical: Not on file    Non-medical: Not on file  Tobacco Use  . Smoking status: Never Smoker  . Smokeless tobacco: Never Used  Substance and Sexual Activity  . Alcohol use: Yes    Alcohol/week: 7.0 standard drinks    Types: 7 Glasses of wine per week    Comment: 1 glass of wine daily  . Drug use: No  . Sexual activity: Not Currently  Lifestyle  . Physical activity    Days per week: Not on file    Minutes per session: Not on file  . Stress: Not on file  Relationships  . Social Herbalist on phone: Not on file    Gets together: Not on file    Attends religious service: Not on file    Active member of club or organization: Not on file    Attends meetings of clubs or organizations: Not on file    Relationship status: Not on file  . Intimate partner violence    Fear of current or ex partner: Not on file    Emotionally abused: Not on file    Physically abused: Not on file    Forced sexual activity: Not on file  Other Topics Concern  . 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   [place] for [employer] part-time    Family History: Family History  Problem Relation Age of Onset  . Depression Mother   . Schizophrenia Mother   . Cancer Father        oral  . Heart disease Father        bradycardia  . Cancer Sister        skin and lung  . COPD Sister   . Hypertension Sister   . Osteoporosis Sister   . Hyperthyroidism Brother   . Cancer Brother        metastatic cancer to bone (?primary)  . Hyperlipidemia Daughter   . Asthma Son   . Cancer Paternal Grandmother 5       colon cancer, metastatic to liver  . Colon cancer Paternal Grandmother   . Diabetes Maternal Grandmother   . ADD / ADHD Other     Review of Systems: Review of Systems  Constitutional: Negative.   HENT: Negative for ear discharge, ear pain, sinus pain and sore throat.        + vision changes (decreased visual fields)   Respiratory: Negative.   Cardiovascular: Negative.   Musculoskeletal: Negative.   Skin: Negative.   Neurological: Negative.   Psychiatric/Behavioral: Negative.     Physical Exam: Vital Signs BP (!) 147/75 (BP Location: Left Arm, Patient Position: Sitting, Cuff Size: Normal)   Pulse 69   Temp (!) 97.1 F (36.2 C) (Temporal)   Ht 5\' 5"  (1.651 m)   Wt 165 lb (74.8 kg)  SpO2 97%   BMI 27.46 kg/m   Physical Exam Exam conducted with a chaperone present.  Constitutional:      General: She is not in acute distress.    Appearance: Normal appearance. She is not ill-appearing.  HENT:     Head: Normocephalic and atraumatic. Brow ptosis noted. Eyes:     Pupils: Pupils are equal, round Neck:     Musculoskeletal: Normal range of motion.  Cardiovascular:     Rate and Rhythm: Normal rate and regular rhythm.     Pulses: Normal pulses.     Heart sounds: Normal heart sounds. No murmur.  Pulmonary:     Effort: Pulmonary effort is normal. No respiratory distress.     Breath sounds: Normal breath sounds. No wheezing.  Abdominal:     General:  Abdomen is flat. There is no distension.     Palpations: Abdomen is soft.     Tenderness: There is no abdominal tenderness.  Musculoskeletal: Normal range of motion.  Skin:    General: Skin is warm and dry.     Findings: No erythema or rash.  Neurological:     General: No focal deficit present.     Mental Status: She is alert and oriented to person, place, and time. Mental status is at baseline.     Motor: No weakness.  Psychiatric:        Mood and Affect: Mood normal.        Behavior: Behavior normal.    Assessment/Plan: Mrs. Locascio is scheduled for brow lift with Dr. Marla Roe on 10/10/18. Benefits, and alternatives of procedure discussed, questions answered and consent obtained.    The consent was obtained with risks and complications reviewed which included bleeding, pain, scar, infection and the risk of anesthesia.  The patients questions were answered to the patients expressed satisfaction.   Prescriptions sent to pharmacy.  She has had her covid test and is doing well. No concerns or complaints. She has stopped her aspirin, celebrex.  Electronically signed by: Carola Rhine Samar Venneman, PA-C 10/07/2018 9:56 AM

## 2018-10-07 ENCOUNTER — Encounter: Payer: Self-pay | Admitting: Surgical

## 2018-10-07 ENCOUNTER — Ambulatory Visit (INDEPENDENT_AMBULATORY_CARE_PROVIDER_SITE_OTHER): Payer: Medicare Other | Admitting: Surgical

## 2018-10-07 ENCOUNTER — Other Ambulatory Visit: Payer: Self-pay

## 2018-10-07 ENCOUNTER — Other Ambulatory Visit (HOSPITAL_COMMUNITY): Payer: Medicare Other

## 2018-10-07 VITALS — BP 147/75 | HR 69 | Temp 97.1°F | Ht 65.0 in | Wt 165.0 lb

## 2018-10-07 DIAGNOSIS — H57819 Brow ptosis, unspecified: Secondary | ICD-10-CM

## 2018-10-07 MED ORDER — CEPHALEXIN 500 MG PO CAPS: 500.0000 mg | ORAL_CAPSULE | Freq: Two times a day (BID) | ORAL | 0 refills | Status: AC

## 2018-10-07 MED ORDER — ONDANSETRON HCL 4 MG PO TABS: 4.0000 mg | ORAL_TABLET | Freq: Three times a day (TID) | ORAL | 0 refills | Status: DC | PRN

## 2018-10-07 MED FILL — ONDANSETRON HCL 4 MG TABLET: 4 | 6 days supply | Qty: 20 | Fill #0

## 2018-10-07 MED FILL — CEPHALEXIN 500 MG CAPSULE: 500 | 3 days supply | Qty: 6 | Fill #0

## 2018-10-08 MED FILL — ESOMEPRAZOLE MAG DR 40 MG C: 40 | 90 days supply | Qty: 90 | Fill #1

## 2018-10-10 ENCOUNTER — Ambulatory Visit (HOSPITAL_BASED_OUTPATIENT_CLINIC_OR_DEPARTMENT_OTHER): Payer: Medicare Other | Admitting: Certified Registered"

## 2018-10-10 ENCOUNTER — Other Ambulatory Visit: Payer: Self-pay

## 2018-10-10 ENCOUNTER — Encounter (HOSPITAL_BASED_OUTPATIENT_CLINIC_OR_DEPARTMENT_OTHER)
Admission: RE | Disposition: A | Discharge status: Home / Self Care | Payer: Self-pay | Source: Home / Self Care | Attending: Plastic Surgery

## 2018-10-10 ENCOUNTER — Ambulatory Visit (HOSPITAL_BASED_OUTPATIENT_CLINIC_OR_DEPARTMENT_OTHER)
Admission: RE | Admit: 2018-10-10 | Discharge: 2018-10-10 | Disposition: A | Discharge status: Home / Self Care | Payer: Medicare Other | Attending: Plastic Surgery | Admitting: Plastic Surgery

## 2018-10-10 ENCOUNTER — Encounter (HOSPITAL_BASED_OUTPATIENT_CLINIC_OR_DEPARTMENT_OTHER): Payer: Self-pay | Admitting: Certified Registered Nurse Anesthetist

## 2018-10-10 ENCOUNTER — Other Ambulatory Visit (HOSPITAL_COMMUNITY): Payer: Medicare Other

## 2018-10-10 DIAGNOSIS — K219 Gastro-esophageal reflux disease without esophagitis: Secondary | ICD-10-CM | POA: Insufficient documentation

## 2018-10-10 DIAGNOSIS — Z791 Long term (current) use of non-steroidal anti-inflammatories (NSAID): Secondary | ICD-10-CM | POA: Diagnosis not present

## 2018-10-10 DIAGNOSIS — Z79899 Other long term (current) drug therapy: Secondary | ICD-10-CM | POA: Insufficient documentation

## 2018-10-10 DIAGNOSIS — G4730 Sleep apnea, unspecified: Secondary | ICD-10-CM | POA: Insufficient documentation

## 2018-10-10 DIAGNOSIS — J45909 Unspecified asthma, uncomplicated: Secondary | ICD-10-CM | POA: Diagnosis not present

## 2018-10-10 DIAGNOSIS — H57813 Brow ptosis, bilateral: Secondary | ICD-10-CM | POA: Diagnosis not present

## 2018-10-10 DIAGNOSIS — Z7982 Long term (current) use of aspirin: Secondary | ICD-10-CM | POA: Diagnosis not present

## 2018-10-10 DIAGNOSIS — E785 Hyperlipidemia, unspecified: Secondary | ICD-10-CM | POA: Insufficient documentation

## 2018-10-10 DIAGNOSIS — H57819 Brow ptosis, unspecified: Secondary | ICD-10-CM | POA: Diagnosis not present

## 2018-10-10 HISTORY — PX: BROW LIFT: SHX178

## 2018-10-10 SURGERY — RHYTIDECTOMY, FOREHEAD
Anesthesia: General | Site: Face | Laterality: Bilateral

## 2018-10-10 MED ORDER — LIDOCAINE 2% (20 MG/ML) 5 ML SYRINGE
INTRAMUSCULAR | Status: AC
  Filled 2018-10-10: qty 5

## 2018-10-10 MED ORDER — ONDANSETRON HCL 4 MG/2ML IJ SOLN
INTRAMUSCULAR | Status: AC
  Filled 2018-10-10: qty 2

## 2018-10-10 MED ORDER — FENTANYL CITRATE (PF) 100 MCG/2ML IJ SOLN
INTRAMUSCULAR | Status: AC
  Filled 2018-10-10: qty 2

## 2018-10-10 MED ORDER — ACETAMINOPHEN 650 MG RE SUPP: 650.0000 mg | RECTAL | Status: DC | PRN

## 2018-10-10 MED ORDER — ONDANSETRON HCL 4 MG/2ML IJ SOLN
INTRAMUSCULAR | Status: DC | PRN
  Administered 2018-10-10: 4 mg via INTRAVENOUS

## 2018-10-10 MED ORDER — FENTANYL CITRATE (PF) 100 MCG/2ML IJ SOLN
50.0000 ug | INTRAMUSCULAR | Status: AC | PRN
  Administered 2018-10-10: 12:00:00 25 ug via INTRAVENOUS
  Administered 2018-10-10: 12:00:00 50 ug via INTRAVENOUS
  Administered 2018-10-10: 25 ug via INTRAVENOUS

## 2018-10-10 MED ORDER — PROPOFOL 500 MG/50ML IV EMUL
INTRAVENOUS | Status: AC
  Filled 2018-10-10: qty 50

## 2018-10-10 MED ORDER — SODIUM CHLORIDE 0.9% FLUSH: 3.0000 mL | INTRAVENOUS | Status: DC | PRN

## 2018-10-10 MED ORDER — PROPOFOL 10 MG/ML IV BOLUS
INTRAVENOUS | Status: DC | PRN
  Administered 2018-10-10: 150 mg via INTRAVENOUS

## 2018-10-10 MED ORDER — ARTIFICIAL TEARS OPHTHALMIC OINT
TOPICAL_OINTMENT | OPHTHALMIC | Status: DC | PRN
  Administered 2018-10-10: 1 via OPHTHALMIC

## 2018-10-10 MED ORDER — FENTANYL CITRATE (PF) 100 MCG/2ML IJ SOLN
25.0000 ug | INTRAMUSCULAR | Status: DC | PRN
  Administered 2018-10-10 (×3): 25 ug via INTRAVENOUS

## 2018-10-10 MED ORDER — MIDAZOLAM HCL 2 MG/2ML IJ SOLN
INTRAMUSCULAR | Status: AC
  Filled 2018-10-10: qty 2

## 2018-10-10 MED ORDER — SODIUM CHLORIDE 0.9% FLUSH: 3.0000 mL | Freq: Two times a day (BID) | INTRAVENOUS | Status: DC

## 2018-10-10 MED ORDER — METOCLOPRAMIDE HCL 5 MG/ML IJ SOLN: 10.0000 mg | Freq: Once | INTRAMUSCULAR | Status: DC | PRN

## 2018-10-10 MED ORDER — HYDROMORPHONE HCL 1 MG/ML IJ SOLN
INTRAMUSCULAR | Status: AC
  Filled 2018-10-10: qty 0.5

## 2018-10-10 MED ORDER — SODIUM CHLORIDE 0.9 % IV SOLN: 250.0000 mL | INTRAVENOUS | Status: DC | PRN

## 2018-10-10 MED ORDER — DEXAMETHASONE SODIUM PHOSPHATE 10 MG/ML IJ SOLN
INTRAMUSCULAR | Status: AC
  Filled 2018-10-10: qty 1

## 2018-10-10 MED ORDER — KETOROLAC TROMETHAMINE 30 MG/ML IJ SOLN
INTRAMUSCULAR | Status: AC
  Filled 2018-10-10: qty 1

## 2018-10-10 MED ORDER — DEXAMETHASONE SODIUM PHOSPHATE 10 MG/ML IJ SOLN
INTRAMUSCULAR | Status: DC | PRN
  Administered 2018-10-10: 10 mg via INTRAVENOUS

## 2018-10-10 MED ORDER — CEFAZOLIN SODIUM-DEXTROSE 2-4 GM/100ML-% IV SOLN
INTRAVENOUS | Status: AC
  Filled 2018-10-10: qty 100

## 2018-10-10 MED ORDER — SCOPOLAMINE 1 MG/3DAYS TD PT72: 1.0000 | MEDICATED_PATCH | Freq: Once | TRANSDERMAL | Status: DC

## 2018-10-10 MED ORDER — LACTATED RINGERS IV SOLN
INTRAVENOUS | Status: DC
  Administered 2018-10-10 (×2): via INTRAVENOUS

## 2018-10-10 MED ORDER — CEFAZOLIN SODIUM-DEXTROSE 2-4 GM/100ML-% IV SOLN
2.0000 g | INTRAVENOUS | Status: AC
  Administered 2018-10-10: 12:00:00 2 g via INTRAVENOUS

## 2018-10-10 MED ORDER — CHLORHEXIDINE GLUCONATE CLOTH 2 % EX PADS: 6.0000 | MEDICATED_PAD | Freq: Once | CUTANEOUS | Status: DC

## 2018-10-10 MED ORDER — HYDROMORPHONE HCL 1 MG/ML IJ SOLN
0.5000 mg | Freq: Once | INTRAMUSCULAR | Status: AC | PRN
  Administered 2018-10-10: 14:00:00 0.5 mg via INTRAVENOUS

## 2018-10-10 MED ORDER — LIDOCAINE 2% (20 MG/ML) 5 ML SYRINGE
INTRAMUSCULAR | Status: DC | PRN
  Administered 2018-10-10: 100 mg via INTRAVENOUS

## 2018-10-10 MED ORDER — KETOROLAC TROMETHAMINE 15 MG/ML IJ SOLN
15.0000 mg | Freq: Once | INTRAMUSCULAR | Status: DC | PRN
  Administered 2018-10-10: 15 mg via INTRAVENOUS

## 2018-10-10 MED ORDER — EPHEDRINE SULFATE 50 MG/ML IJ SOLN
INTRAMUSCULAR | Status: DC | PRN
  Administered 2018-10-10 (×2): 5 mg via INTRAVENOUS
  Administered 2018-10-10: 10 mg via INTRAVENOUS

## 2018-10-10 MED ORDER — MEPERIDINE HCL 25 MG/ML IJ SOLN: 6.2500 mg | INTRAMUSCULAR | Status: DC | PRN

## 2018-10-10 MED ORDER — LIDOCAINE-EPINEPHRINE 1 %-1:100000 IJ SOLN
INTRAMUSCULAR | Status: DC | PRN
  Administered 2018-10-10: 2 mL

## 2018-10-10 MED ORDER — ACETAMINOPHEN 325 MG PO TABS: 650.0000 mg | ORAL_TABLET | ORAL | Status: DC | PRN

## 2018-10-10 MED ORDER — MIDAZOLAM HCL 2 MG/2ML IJ SOLN
1.0000 mg | INTRAMUSCULAR | Status: DC | PRN
  Administered 2018-10-10: 12:00:00 2 mg via INTRAVENOUS

## 2018-10-10 SURGICAL SUPPLY — 48 items
APPLICATOR COTTON TIP 6 STRL (MISCELLANEOUS) IMPLANT
APPLICATOR COTTON TIP 6IN STRL (MISCELLANEOUS)
APPLICATOR DR MATTHEWS STRL (MISCELLANEOUS) IMPLANT
BLADE SURG 15 STRL LF DISP TIS (BLADE) ×2 IMPLANT
BLADE SURG 15 STRL SS (BLADE) ×2
BNDG EYE OVAL (GAUZE/BANDAGES/DRESSINGS) IMPLANT
CORD BIPOLAR FORCEPS 12FT (ELECTRODE) ×2 IMPLANT
COVER BACK TABLE REUSABLE LG (DRAPES) ×2 IMPLANT
COVER MAYO STAND REUSABLE (DRAPES) ×2 IMPLANT
COVER WAND RF STERILE (DRAPES) IMPLANT
DECANTER SPIKE VIAL GLASS SM (MISCELLANEOUS) IMPLANT
DERMABOND ADVANCED (GAUZE/BANDAGES/DRESSINGS)
DERMABOND ADVANCED .7 DNX12 (GAUZE/BANDAGES/DRESSINGS) IMPLANT
DRAPE SPLIT 6X30 W/TAPE (DRAPES) ×2 IMPLANT
ELECT NDL BLADE 2-5/6 (NEEDLE) IMPLANT
ELECT NEEDLE BLADE 2-5/6 (NEEDLE) IMPLANT
ELECT REM PT RETURN 9FT ADLT (ELECTROSURGICAL) ×2
ELECTRODE REM PT RTRN 9FT ADLT (ELECTROSURGICAL) ×1 IMPLANT
GAUZE 4X4 16PLY RFD (DISPOSABLE) ×1 IMPLANT
GAUZE SPONGE 4X4 12PLY STRL (GAUZE/BANDAGES/DRESSINGS) ×1 IMPLANT
GLOVE BIO SURGEON STRL SZ 6.5 (GLOVE) ×6 IMPLANT
GLOVE BIO SURGEON STRL SZ7 (GLOVE) ×2 IMPLANT
GLOVE BIOGEL PI IND STRL 6.5 (GLOVE) IMPLANT
GLOVE BIOGEL PI IND STRL 7.0 (GLOVE) IMPLANT
GLOVE BIOGEL PI IND STRL 7.5 (GLOVE) IMPLANT
GLOVE BIOGEL PI INDICATOR 6.5 (GLOVE) ×1
GLOVE BIOGEL PI INDICATOR 7.0 (GLOVE) ×1
GLOVE BIOGEL PI INDICATOR 7.5 (GLOVE) ×1
GOWN STRL REUS W/ TWL LRG LVL3 (GOWN DISPOSABLE) ×2 IMPLANT
GOWN STRL REUS W/TWL LRG LVL3 (GOWN DISPOSABLE) ×6
NDL HYPO 30GX1 BEV (NEEDLE) IMPLANT
NDL PRECISIONGLIDE 27X1.5 (NEEDLE) IMPLANT
NEEDLE HYPO 30GX1 BEV (NEEDLE) IMPLANT
NEEDLE PRECISIONGLIDE 27X1.5 (NEEDLE) ×2 IMPLANT
PACK BASIN DAY SURGERY FS (CUSTOM PROCEDURE TRAY) ×2 IMPLANT
PENCIL BUTTON HOLSTER BLD 10FT (ELECTRODE) IMPLANT
SHIELD EYE LENSE ONLY DISP (GAUZE/BANDAGES/DRESSINGS) IMPLANT
SLEEVE SCD COMPRESS KNEE MED (MISCELLANEOUS) ×1 IMPLANT
STRIP CLOSURE SKIN 1/2X4 (GAUZE/BANDAGES/DRESSINGS) ×2 IMPLANT
STRIP SUTURE WOUND CLOSURE 1/2 (SUTURE) IMPLANT
SUT MNCRL 6-0 UNDY P1 1X18 (SUTURE) IMPLANT
SUT MON AB 5-0 P3 18 (SUTURE) ×1 IMPLANT
SUT MON AB 5-0 PS2 18 (SUTURE) ×1 IMPLANT
SUT MONOCRYL 6-0 P1 1X18 (SUTURE)
SUT PROLENE 6 0 P 1 18 (SUTURE) IMPLANT
SYR CONTROL 10ML LL (SYRINGE) ×1 IMPLANT
TOWEL GREEN STERILE FF (TOWEL DISPOSABLE) ×4 IMPLANT
TRAY DSU PREP LF (CUSTOM PROCEDURE TRAY) ×2 IMPLANT

## 2018-10-10 NOTE — Anesthesia Preprocedure Evaluation (Signed)
Anesthesia Evaluation  Patient identified by MRN, date of birth, ID band Patient awake    Reviewed: Allergy & Precautions, NPO status , Patient's Chart, lab work & pertinent test results  Airway Mallampati: II  TM Distance: >3 FB Neck ROM: Full    Dental no notable dental hx.    Pulmonary asthma , sleep apnea ,    Pulmonary exam normal breath sounds clear to auscultation       Cardiovascular negative cardio ROS Normal cardiovascular exam Rhythm:Regular Rate:Normal     Neuro/Psych negative neurological ROS  negative psych ROS   GI/Hepatic Neg liver ROS, GERD  Controlled,  Endo/Other  negative endocrine ROS  Renal/GU negative Renal ROS  negative genitourinary   Musculoskeletal negative musculoskeletal ROS (+)   Abdominal   Peds negative pediatric ROS (+)  Hematology negative hematology ROS (+)   Anesthesia Other Findings   Reproductive/Obstetrics negative OB ROS                             Anesthesia Physical Anesthesia Plan  ASA: II  Anesthesia Plan: General   Post-op Pain Management:    Induction: Intravenous  PONV Risk Score and Plan: 3 and Ondansetron, Dexamethasone and Treatment may vary due to age or medical condition  Airway Management Planned: LMA  Additional Equipment:   Intra-op Plan:   Post-operative Plan: Extubation in OR  Informed Consent: I have reviewed the patients History and Physical, chart, labs and discussed the procedure including the risks, benefits and alternatives for the proposed anesthesia with the patient or authorized representative who has indicated his/her understanding and acceptance.     Dental advisory given  Plan Discussed with: CRNA  Anesthesia Plan Comments:         Anesthesia Quick Evaluation  

## 2018-10-10 NOTE — Discharge Instructions (Signed)
INSTRUCTIONS FOR AFTER SURGERY   You are having surgery.  You will likely have some questions about what to expect following your operation.  The following information will help you and your family understand what to expect when you are discharged from the hospital.  Following these guidelines will help ensure a smooth recovery and reduce risks of complications.   Postoperative instructions include information on: diet, wound care, medications and physical activity.  AFTER SURGERY Expect to go home after the procedure.   DIET This surgery does not require a specific diet.  However, I have to mention that the healthier you eat the better your body can start healing. It is important to increasing your protein intake.  This means limiting the foods with sugar and carbohydrates.  Focus on vegetables and some meat.  If you have any liposuction during your procedure be sure to drink water.  If your urine is bright yellow, then it is concentrated, and you need to drink more water.  As a general rule after surgery, you should have 8 ounces of water every hour while awake.  If you find you are persistently nauseated or unable to take in liquids let us know.  NO TOBACCO USE or EXPOSURE.  This will slow your healing process and increase the risk of a wound.  WOUND CARE You can shower the day after surgery if you don't have a drain.  Use fragrance free soap.  Dial, [place] and [place] are usually mild on the skin.  No baths, pools or hot tubs for two weeks. We close your incision to leave the smallest and best-looking scar. No ointment or creams on your incisions until given the go ahead.  Especially not Neosporin (Too many skin reactions with this one).  A few weeks after surgery you can use Mederma and start massaging the scar. We ask you to wear your head band for 1 week, including while sleeping. This provides added comfort and helps reduce the fluid accumulation at the surgery site.  ACTIVITY No heavy lifting  until cleared by the doctor.  It is OK to walk and climb stairs. In fact, moving your legs is very important to decrease your risk of a blood clot.  It will also help keep you from getting deconditioned.  Every 1 to 2 hours get up and walk for 5 minutes. This will help with a quicker recovery back to normal.  Let pain be your guide so you don't do too much.  NO, you cannot do the spring cleaning and don't plan on taking care of anyone else.  This is your time for TLC.  You will be more comfortable if you sleep and rest with your head elevated either with a few pillows under you or in a recliner.  No stomach sleeping for a few weeks.  WORK Everyone returns to work at different times. As a rough guide, most people take at least 1 - 2 weeks off prior to returning to work. If you need documentation for your job, bring the forms to your postoperative follow up visit.  DRIVING Arrange for someone to bring you home from the hospital.  You may be able to drive a few days after surgery but not while taking any narcotics.  BOWEL MOVEMENTS Constipation can occur after anesthesia and while taking pain medication.  It is important to stay ahead for your comfort.  We recommend taking Milk of Magnesia (2 tablespoons; twice a day) while taking the pain pills.  SEROMA This  is fluid your body tried to put in the surgical site.  This is normal but if it creates tight skinny skin let us know.  It usually decreases in a few weeks.  WHEN TO CALL Call your surgeon's office if any of the following occur:  Fever 101 degrees F or greater  Excessive bleeding or fluid from the incision site.  Pain that increases over time without aid from the medications  Redness, warmth, or pus draining from incision sites  Persistent nausea or inability to take in liquids  Severe misshapen area that underwent the operation.    Post Anesthesia Home Care Instructions  Activity: Get plenty of rest for the remainder of the day.  A responsible individual must stay with you for 24 hours following the procedure.  For the next 24 hours, DO NOT: -Drive a car -Paediatric nurse -Drink alcoholic beverages -Take any medication unless instructed by your physician -Make any legal decisions or sign important papers.  Meals: Start with liquid foods such as gelatin or soup. Progress to regular foods as tolerated. Avoid greasy, spicy, heavy foods. If nausea and/or vomiting occur, drink only clear liquids until the nausea and/or vomiting subsides. Call your physician if vomiting continues.  Special Instructions/Symptoms: Your throat may feel dry or sore from the anesthesia or the breathing tube placed in your throat during surgery. If this causes discomfort, gargle with warm salt water. The discomfort should disappear within 24 hours.  If you had a scopolamine patch placed behind your ear for the management of post- operative nausea and/or vomiting:  1. The medication in the patch is effective for 72 hours, after which it should be removed.  Wrap patch in a tissue and discard in the trash. Wash hands thoroughly with soap and water. 2. You may remove the patch earlier than 72 hours if you experience unpleasant side effects which may include dry mouth, dizziness or visual disturbances. 3. Avoid touching the patch. Wash your hands with soap and water after contact with the patch.

## 2018-10-10 NOTE — Anesthesia Procedure Notes (Signed)
Procedure Name: LMA Insertion Date/Time: 10/10/2018 11:36 AM Performed by: Genelle Bal, CRNA Pre-anesthesia Checklist: Patient identified, Emergency Drugs available, Suction available and Patient being monitored Patient Re-evaluated:Patient Re-evaluated prior to induction Oxygen Delivery Method: Circle system utilized Preoxygenation: Pre-oxygenation with 100% oxygen Induction Type: IV induction Ventilation: Mask ventilation without difficulty LMA: LMA inserted LMA Size: 4.0 Number of attempts: 1 Airway Equipment and Method: Bite block Placement Confirmation: positive ETCO2 Tube secured with: Tape Dental Injury: Teeth and Oropharynx as per pre-operative assessment

## 2018-10-10 NOTE — Interval H&P Note (Signed)
History and Physical Interval Note:  10/10/2018 11:15 AM  Beverly [name]  has presented today for surgery, with the diagnosis of brow ptosis.  The various methods of treatment have been discussed with the patient and family. After consideration of risks, benefits and other options for treatment, the patient has consented to  Procedure(s) with comments: BROW LIFT (Bilateral) - 90 min as a surgical intervention.  The patient's history has been reviewed, patient examined, no change in status, stable for surgery.  I have reviewed the patient's chart and labs.  Questions were answered to the patient's satisfaction.     Loel Lofty Dillingham

## 2018-10-10 NOTE — Op Note (Signed)
DATE OF OPERATION: 10/10/2018  LOCATION: [facility]  PREOPERATIVE DIAGNOSIS: brow ptosis  POSTOPERATIVE DIAGNOSIS: Same  PROCEDURE: brow lift  SURGEON: Lyndee Leo Sanger Adiel Mcnamara, DO  ASSISTANT: Roetta Sessions, PA and [place], RNFA  EBL: 10 cc  CONDITION: Stable  COMPLICATIONS: None  INDICATION: The patient, Beverly [name], is a 73 y.o. female born on January 08, 1946, is here for treatment of brow ptosis significantly effecting her visual field.  PROCEDURE DETAILS:  The patient was seen prior to surgery and marked.  The IV antibiotics were given. The patient was taken to the operating room and given a general anesthetic. A standard time out was performed and all information was confirmed by those in the room. SCDs were placed.   The patient's forehead was prepped and draped.  1% lidocaine with epinephrine was used to inject at the incision site.  The 15 blade was used to make the incision the premarked line.  This was done with a beaval in order to preserve hair follicles.  Combination of tissue scissors to dissect to the brow.  The skin was then elevated at the premarked tension just over a centimeter.  The muscle was preserved this was achieved with electrocautery.  The procerus areas were cauterized.  The head was then tacked in place with 5-0 Monocryl simple interrupted sutures and 4 areas.  A running subcuticular 5-0 Monocryl was then used to close the incision site.  There was good symmetry at the end of the case. The patient was allowed to wake up and taken to recovery room in stable condition at the end of the case. The family was notified at the end of the case.   The advanced practice practitioner (APP) assisted throughout the case.  The APP was essential in retraction and counter traction when needed to make the case progress smoothly.  This retraction and assistance made it possible to see the tissue plans for the procedure.  The assistance was needed for blood  control, tissue re-approximation and assisted with closure of the incision site.

## 2018-10-10 NOTE — Anesthesia Postprocedure Evaluation (Signed)
Anesthesia Post Note  Patient: Beverly [name]  Procedure(s) Performed: Delphina Cahill LIFT (Bilateral Face)     Patient location during evaluation: PACU Anesthesia Type: General Level of consciousness: awake and alert Pain management: pain level controlled Vital Signs Assessment: post-procedure vital signs reviewed and stable Respiratory status: spontaneous breathing, nonlabored ventilation, respiratory function stable and patient connected to nasal cannula oxygen Cardiovascular status: blood pressure returned to baseline and stable Postop Assessment: no apparent nausea or vomiting Anesthetic complications: no    Last Vitals:  Vitals:   10/10/18 1345 10/10/18 1400  BP: 128/74 131/67  Pulse: 72 65  Resp: 11 12  Temp:    SpO2: 99% (!) 87%    Last Pain:  Vitals:   10/10/18 1345  TempSrc:   PainSc: 6                  [name]

## 2018-10-10 NOTE — Transfer of Care (Signed)
Immediate Anesthesia Transfer of Care Note  Patient: Beverly [name]  Procedure(s) Performed: Delphina Cahill LIFT (Bilateral Face)  Patient Location: PACU  Anesthesia Type:General  Level of Consciousness: awake, alert  and oriented  Airway & Oxygen Therapy: Patient Spontanous Breathing and Patient connected to face mask oxygen  Post-op Assessment: Report given to RN and Post -op Vital signs reviewed and stable  Post vital signs: Reviewed and stable  Last Vitals:  Vitals Value Taken Time  BP 152/71   Temp    Pulse 84 10/10/18 1301  Resp 11 10/10/18 1301  SpO2 98 % 10/10/18 1301  Vitals shown include unvalidated device data.  Last Pain:  Vitals:   10/10/18 1059  TempSrc: Oral  PainSc: 0-No pain         Complications: No apparent anesthesia complications

## 2018-10-11 ENCOUNTER — Encounter (HOSPITAL_BASED_OUTPATIENT_CLINIC_OR_DEPARTMENT_OTHER): Payer: Self-pay | Admitting: Plastic Surgery

## 2018-10-11 ENCOUNTER — Encounter: Payer: Self-pay | Admitting: Plastic Surgery

## 2018-10-14 NOTE — Telephone Encounter (Signed)
Called and spoke with the patient on (10/13/18) regarding her MyChart message.  Patient stated that she's doing well.  Her forehead is tender to the touch.  Most of the swelling has gone down and the black eyes have improved.   She stated that she has changed the dressing under the band.  She wants to know when she can take the band completely off and wash her hair before her appointment on (10/18/18).  Informed the patient that I will speak with Dr. Marla Roe and give her a call back.  Patient verbalized understanding and agreed.//AB/CMA

## 2018-10-14 NOTE — Telephone Encounter (Signed)
Called patient and informed her that per Dr. Marla Roe she can take the gauze off unless she notices drainage.  Patient stated that she has not noticed any drainage.  Informed her that she can wear a loose head band, and she can take a shower today.  Patient stated can I wash my hair, and I told her that Dr. Marla Roe said she can take a shower today and didn't say she could not wash her hair.  Patient verbalized understanding and agreed.//AB/CMA

## 2018-10-17 ENCOUNTER — Encounter: Payer: Self-pay | Admitting: *Deleted

## 2018-10-17 ENCOUNTER — Telehealth: Payer: Self-pay

## 2018-10-17 NOTE — Telephone Encounter (Signed)

## 2018-10-17 NOTE — Progress Notes (Signed)
   Subjective:     Patient ID: Beverly [name], female    DOB: 08-12-1945, 73 y.o.   MRN: [id number]  Chief Complaint  Patient presents with  . Follow-up    HPI: The patient is a 73 y.o. female here for follow-up after brow lift surgery on 10/10/18 with Dr. Marla Roe. She is POD#8.  She is doing well. Incisions are healing nicely and beginning to scab. No sign of infection, seroma, hematoma. No drainage noted.  She has sutures in place. Incision is approximated.  Antibiotic completed, pain adequately controlled. She reports some numbness of the forehead. She is able to raise her eyebrows and close her eyes fully.  Vision significantly improved per pt. No fevers, chills.  Review of Systems  Constitutional: Negative for chills, diaphoresis, fever, malaise/fatigue and weight loss.  HENT: Negative.   Eyes: Negative for blurred vision, double vision, photophobia, pain, discharge and redness.  Respiratory: Negative.   Cardiovascular: Negative.   Genitourinary: Negative.   Musculoskeletal: Negative.   Skin: Negative for itching and rash.  Neurological: Positive for sensory change.       + numbness forehead   Psychiatric/Behavioral: Negative.      Objective:   Vital Signs BP 134/86 (BP Location: Left Arm, Patient Position: Sitting, Cuff Size: Normal)   Pulse 63   Temp 97.9 F (36.6 C) (Temporal)   Ht 5\' 5"  (1.651 m)   Wt 166 lb (75.3 kg)   SpO2 95%   BMI 27.62 kg/m  Vital Signs and Nursing Note Reviewed  Physical Exam  Constitutional: She is oriented to person, place, and time and well-developed, well-nourished, and in no distress. No distress.  HENT:  Head: Normocephalic and atraumatic.    Eyes: Pupils are equal, round, and reactive to light. Conjunctivae, EOM and lids are normal. Right eye exhibits no discharge. Left eye exhibits no discharge. Right conjunctiva is not injected. Right conjunctiva has no hemorrhage. Left conjunctiva is not injected. Left conjunctiva  has no hemorrhage.  Neck: Normal range of motion. Neck supple.  Cardiovascular: Normal rate.  Pulmonary/Chest: Effort normal and breath sounds normal.  Abdominal: Soft. Bowel sounds are normal. She exhibits no distension. There is no abdominal tenderness. There is no rebound.  Musculoskeletal: Normal range of motion.        General: No edema.  Neurological: She is alert and oriented to person, place, and time. Gait normal.  Skin: Skin is warm and dry. No rash noted. She is not diaphoretic. No erythema. No pallor.  Psychiatric: Mood and affect normal.      Assessment/Plan:     ICD-10-CM   1. Brow ptosis  H57.819     Mrs. Sendejas is doing well. Her incisions are approximated, she has some scabbing, but is healing nicely. Her vision has significantly improved.  3 Interrupted sutures removed from the central aspect of her incision.  Pictures were obtained of the patient and placed in the chart with the patient's or guardian's permission.  Follow up in 1 week for suture removal at edges of incision.  Carola Rhine Sharmin Foulk, PA-C 10/18/2018, 11:05 AM

## 2018-10-18 ENCOUNTER — Other Ambulatory Visit: Payer: Self-pay

## 2018-10-18 ENCOUNTER — Ambulatory Visit (INDEPENDENT_AMBULATORY_CARE_PROVIDER_SITE_OTHER): Payer: Medicare Other | Admitting: Surgical

## 2018-10-18 ENCOUNTER — Encounter: Payer: Self-pay | Admitting: Surgical

## 2018-10-18 VITALS — BP 134/86 | HR 63 | Temp 97.9°F | Ht 65.0 in | Wt 166.0 lb

## 2018-10-18 DIAGNOSIS — H57819 Brow ptosis, unspecified: Secondary | ICD-10-CM

## 2018-10-21 ENCOUNTER — Encounter: Payer: Medicare Other | Admitting: Surgical

## 2018-10-25 ENCOUNTER — Other Ambulatory Visit: Payer: Self-pay

## 2018-10-25 ENCOUNTER — Encounter: Payer: Self-pay | Admitting: Surgical

## 2018-10-25 ENCOUNTER — Ambulatory Visit (INDEPENDENT_AMBULATORY_CARE_PROVIDER_SITE_OTHER): Payer: Medicare Other | Admitting: Surgical

## 2018-10-25 VITALS — BP 122/72 | HR 64 | Temp 97.3°F | Ht 65.0 in | Wt 167.5 lb

## 2018-10-25 DIAGNOSIS — H57819 Brow ptosis, unspecified: Secondary | ICD-10-CM

## 2018-10-25 MED FILL — MONTELUKAST SOD 10 MG TAB: 10 | 90 days supply | Qty: 90 | Fill #1

## 2018-10-25 NOTE — Progress Notes (Signed)
Beverly [name] is a 73 yo female here follow up after brow lift surgery on 10/10/2018.  She is doing well.  Incisions are healing nicely.  The left side is healing really well, with the right side having a little bit of scabbing towards the medial aspect.  She reports that she has had some back pain and sciatica on the right side and went to her pain management doctor who prescribed a steroid Dosepak.  This may inhibit healing slightly, but she is healing well so far.  She still has some sensation change and numbness but it is improving.  No fevers, chills, nausea, vomiting.  Additional sutures removed today. Follow-up as needed.

## 2018-10-26 MED FILL — GABAPENTIN 800 MG TABS: 800 | 90 days supply | Qty: 90 | Fill #0

## 2018-11-01 ENCOUNTER — Encounter: Payer: Self-pay | Admitting: Family Medicine

## 2018-11-02 ENCOUNTER — Encounter: Payer: Self-pay | Admitting: *Deleted

## 2018-11-14 MED FILL — ATORVASTATIN 40 MG TABLET: 40 | 90 days supply | Qty: 90 | Fill #1

## 2018-11-16 ENCOUNTER — Telehealth: Payer: Self-pay | Admitting: Surgical

## 2018-11-16 NOTE — Telephone Encounter (Signed)
lvm for pt reminding of appt on 11/17/18. Left number in case pt had questions.

## 2018-11-17 ENCOUNTER — Ambulatory Visit (INDEPENDENT_AMBULATORY_CARE_PROVIDER_SITE_OTHER): Payer: Medicare Other | Admitting: Surgical

## 2018-11-17 ENCOUNTER — Other Ambulatory Visit: Payer: Self-pay

## 2018-11-17 ENCOUNTER — Encounter: Payer: Self-pay | Admitting: Surgical

## 2018-11-17 VITALS — BP 147/71 | HR 66 | Temp 97.1°F | Ht 65.0 in | Wt 168.0 lb

## 2018-11-17 DIAGNOSIS — H57819 Brow ptosis, unspecified: Secondary | ICD-10-CM

## 2018-11-17 NOTE — Progress Notes (Signed)
The patient is a 74 year old female here for follow-up after brow lift by Dr. Marla Roe on 10/10/2018.  She was last seen 10/25/2018 and was doing well.  Today she is here for follow-up for removal of additional sutures.  On exam there is a few running Monocryl sutures that have not dissolved yet and were pushed out through the skin.  They were removed with ease and without any issue.  No openings in skin.  No drainage noted.  Her incision is C/D/I.  No sign of dehiscence, no erythema, no sign of infection.  She is healing really well.  She reports that her vision is significantly better and she does not have to strain.  She has no complaints.   Follow-up in 6 months.  Call with questions or concerns prior to then.

## 2018-11-22 ENCOUNTER — Other Ambulatory Visit: Payer: Self-pay | Admitting: Family Medicine

## 2018-11-22 ENCOUNTER — Telehealth: Payer: Self-pay | Admitting: Family Medicine

## 2018-11-22 MED FILL — AZELASTINE HCL 137 MCG SPRY: 0.1 | 25 days supply | Qty: 30 | Fill #1

## 2018-11-22 MED FILL — OMEGA-3 ETHYL ESTERS 1 GM C: 1 | 90 days supply | Qty: 360 | Fill #0

## 2018-11-22 NOTE — Telephone Encounter (Signed)
Received fax for from pharmacy for a refill on Omega-3 Ethyl Esters

## 2018-11-22 NOTE — Telephone Encounter (Signed)
Already handled by [place]

## 2018-11-22 NOTE — Telephone Encounter (Signed)
Pt has an appt in november 

## 2018-12-01 MED FILL — PARoxetine HCL 40 MG TABS: 40 | 90 days supply | Qty: 90 | Fill #0

## 2018-12-08 MED FILL — ESTRADIOL 0.5 MG TABS: 0.5 | 90 days supply | Qty: 90 | Fill #3

## 2018-12-09 MED FILL — METHOCARBAMOL 500 MG TABLET: 500 | 60 days supply | Qty: 180 | Fill #0

## 2018-12-12 LAB — HM MAMMOGRAPHY

## 2018-12-15 MED FILL — CELECOXIB 200 MG CAP: 200 | 90 days supply | Qty: 180 | Fill #1

## 2018-12-17 MED FILL — IMIQUIMOD 5 % CREA: 5 | 14 days supply | Qty: 6 | Fill #0

## 2018-12-18 ENCOUNTER — Encounter: Payer: Self-pay | Admitting: Family Medicine

## 2018-12-19 ENCOUNTER — Other Ambulatory Visit: Payer: Self-pay

## 2018-12-19 ENCOUNTER — Encounter: Payer: Self-pay | Admitting: Family Medicine

## 2018-12-19 ENCOUNTER — Ambulatory Visit: Payer: Medicare Other | Admitting: Family Medicine

## 2018-12-19 VITALS — Ht 65.0 in | Wt 165.0 lb

## 2018-12-19 DIAGNOSIS — J4531 Mild persistent asthma with (acute) exacerbation: Secondary | ICD-10-CM | POA: Diagnosis not present

## 2018-12-19 DIAGNOSIS — J309 Allergic rhinitis, unspecified: Secondary | ICD-10-CM

## 2018-12-19 DIAGNOSIS — J209 Acute bronchitis, unspecified: Secondary | ICD-10-CM | POA: Diagnosis not present

## 2018-12-19 NOTE — Patient Instructions (Signed)
Continue your current medications, including Flonase, zyrtec, astelin. Continue Mucinex twice daily, and sudafed as needed.  If cough is more bothersome or keeps you awake at night, add in dextromethorphan (Delsym cough syrup, in addition to listed medications, vs changing to mucinex DM or Robitussin DM).  Contact us if over the next 3-5 days you worsen rather than improve--fever, persistent discolored mucus/phlegm, sinus pain, worsening shortness of breath, or other symptoms. We discussed that if symptoms persist/worsen that we would send in a prescription for doxycycline to treat bacterial bronchitis, and to let us know how your breathing is doing, to know if steroids are also needed.  Continue to drink plenty of fluids.

## 2018-12-19 NOTE — Progress Notes (Addendum)
Start time: 9:29 End time: 9:41  Virtual Visit via Video Note  I connected with Beverly [name] on 12/19/18 at  9:30 AM EST by a video enabled telemedicine application and verified that I am speaking with the correct person using two identifiers.  Location: Patient:  home Provider: office   I discussed the limitations of evaluation and management by telemedicine and the availability of in person appointments. The patient expressed understanding and agreed to proceed.  History of Present Illness:  Chief Complaint  Patient presents with  . Mucus change    change in the color of her mucus from gold to dark brown. When she does start coughing she has a hard time stopping, does use albuterol and this seems to calm the cough.    She has had an ongoing cough (chronic) Mucus changed from a very pale yellow to a brown color over the weekend.  She is coughing more later in the day.  She has needed to use her inhaler more, when she has spasms of coughing.  Other times she can "hear a wheeze", but is able to cough phlegm up and feels better.  Denies any fever. Denies sinus pain or pressure. No discolored mucus from her nose.  No chest pressure or pain with breathing. Patient has history of allergies and asthma. She feels that her allergies are well controlled, denies any flares.   Using astelin, Flonase, zyrtec Uses mucinex 12 hour BID. Uses sudafed about twice daily.  PMH, PSH, SH reviewed  Outpatient Encounter Medications as of 12/19/2018  Medication Sig Note  . albuterol (PROVENTIL HFA;VENTOLIN HFA) 108 (90 Base) MCG/ACT inhaler Inhale 2 puffs into the lungs every 6 (six) hours as needed for wheezing or shortness of breath.   Marland Kitchen aspirin EC 81 MG tablet Take 81 mg by mouth daily.   Marland Kitchen atorvastatin (LIPITOR) 40 MG tablet Take 1 tablet (40 mg total) by mouth daily.   . Azelastine HCl 137 MCG/SPRAY SOLN Place 2 Pump into both nostrils 2 (two) times a day.   . Calcium Carbonate-Vitamin D  (CALCIUM 600+D) 600-200 MG-UNIT TABS Take 1 tablet by mouth 2 (two) times daily.     . celecoxib (CELEBREX) 200 MG capsule Take 1 capsule (200 mg total) by mouth 2 (two) times daily.   . cetirizine (ZYRTEC) 10 MG tablet Take 10 mg by mouth daily.     Marland Kitchen esomeprazole (NEXIUM) 40 MG capsule TAKE 1 CAPSULE BY MOUTH DAILY BEFORE SUPPER.   Marland Kitchen estradiol (ESTRACE) 0.5 MG tablet TAKE 1 TABLET BY MOUTH ONCE DAILY   . fentaNYL (DURAGESIC - DOSED MCG/HR) 25 MCG/HR Place 1 patch onto the skin every 3 (three) days.   10/10/2018: Lt abd on 10/09/18  . Fluorouracil (TOLAK) 4 % CREA Apply topically every 3 (three) days.   . fluticasone (FLONASE) 50 MCG/ACT nasal spray PLACE 2 SPRAYS INTO THE NOSE DAILY.   . fluticasone (FLOVENT HFA) 220 MCG/ACT inhaler INHALE 2 PUFFS INTO THE LUNGS 2 (TWO) TIMES DAILY.   Marland Kitchen gabapentin (NEURONTIN) 800 MG tablet Take 800 mg by mouth at bedtime.    . methocarbamol (ROBAXIN) 500 MG tablet Take 500 mg by mouth daily with breakfast.   . montelukast (SINGULAIR) 10 MG tablet Take 1 tablet (10 mg total) by mouth at bedtime.   . Multiple Vitamins-Minerals (MULTIVITAMIN WITH MINERALS) tablet Take 1 tablet by mouth daily.   Marland Kitchen omega-3 acid ethyl esters (LOVAZA) 1 g capsule TAKE 2 CAPSULES BY MOUTH 2 TIMES DAILY.   Marland Kitchen  OVER THE COUNTER MEDICATION Guaifenesin 600mg -Take as needed.   Marland Kitchen PARoxetine (PAXIL) 30 MG tablet Take 40 mg by mouth daily.    . pseudoephedrine (SUDAFED) 30 MG tablet Take 30 mg by mouth every 4 (four) hours as needed for congestion.   Marland Kitchen acetaminophen (TYLENOL) 500 MG tablet Take 1,000 mg by mouth as needed. Reported on 05/15/2015   . HYDROcodone-acetaminophen (NORCO) 7.5-325 MG tablet Take 1 tablet by mouth every 6 (six) hours as needed. for pain   . loperamide (IMODIUM A-D) 2 MG tablet Take 2 mg by mouth 4 (four) times daily as needed for diarrhea or loose stools. 06/10/2017: Uses prn diarrhea from IBS   No facility-administered encounter medications on file as of 12/19/2018.     Allergies  Allergen Reactions  . Adhesive [Tape] Rash  . Codeine Rash   ROS:  No fever, chills, headaches, dizziness. No GI complaints. No URI symptoms other than cough per HPI.  No rashes    Observations/Objective:  Ht 5\' 5"  (1.651 m)   Wt 165 lb (74.8 kg)   BMI 27.46 kg/m   Well-appearing, pleasant female. She is in good spirits.  She is speaking comfortably, not coughing or clearing throat during her visit. She is alert and oriented. Cranial nerves are grossly intact   Assessment and Plan:  Acute bronchitis, unspecified organism - suspect viral, is early in course. Doxy if sx persist/worsen. Reviewed s/sx infection, supportive measures  Allergic rhinitis, unspecified seasonality, unspecified trigger - continue current regimen  Mild persistent asthmatic bronchitis with exacerbation - continue current regimen, albuterol prn.  Will let us know if requiring increased inhaler use   WL for doxy if needed Will let us know if breathing is worsening to also include steroids.   Follow Up Instructions:    I discussed the assessment and treatment plan with the patient. The patient was provided an opportunity to ask questions and all were answered. The patient agreed with the plan and demonstrated an understanding of the instructions.   The patient was advised to call back or seek an in-person evaluation if the symptoms worsen or if the condition fails to improve as anticipated.  I provided 12 minutes of face-to-face time (video) during this encounter.   Vikki Ports, MD

## 2018-12-21 ENCOUNTER — Encounter: Payer: Self-pay | Admitting: Family Medicine

## 2018-12-21 MED ORDER — DOXYCYCLINE HYCLATE 100 MG PO TABS: 100.0000 mg | ORAL_TABLET | Freq: Two times a day (BID) | ORAL | 0 refills | Status: DC

## 2018-12-21 MED FILL — DOXYCYCLINE HYCLATE 100 MG: 100 | 10 days supply | Qty: 20 | Fill #0

## 2018-12-31 ENCOUNTER — Other Ambulatory Visit: Payer: Self-pay | Admitting: Family Medicine

## 2018-12-31 DIAGNOSIS — K219 Gastro-esophageal reflux disease without esophagitis: Secondary | ICD-10-CM

## 2019-01-02 ENCOUNTER — Telehealth: Payer: Self-pay

## 2019-01-02 DIAGNOSIS — K219 Gastro-esophageal reflux disease without esophagitis: Secondary | ICD-10-CM

## 2019-01-02 MED ORDER — ESOMEPRAZOLE MAGNESIUM 40 MG PO CPDR: DELAYED_RELEASE_CAPSULE | ORAL | 0 refills | Status: DC

## 2019-01-02 MED FILL — ESOMEPRAZOLE MAG DR 40 MG C: 40 | 90 days supply | Qty: 90 | Fill #0

## 2019-01-02 NOTE — Telephone Encounter (Signed)
Received fax from [place] for a refill on Esomeprazole 40 mg #90 with 1 refill last apt was 12/19/18 last [place] was 07/13/18.

## 2019-01-02 NOTE — Telephone Encounter (Signed)
Done

## 2019-01-03 ENCOUNTER — Other Ambulatory Visit: Payer: Self-pay | Admitting: Family Medicine

## 2019-01-04 ENCOUNTER — Encounter: Payer: Self-pay | Admitting: Family Medicine

## 2019-01-08 MED FILL — AZELASTINE HCL 137 MCG SPRY: 0.1 | 25 days supply | Qty: 30 | Fill #2

## 2019-01-09 ENCOUNTER — Telehealth: Payer: Self-pay

## 2019-01-09 ENCOUNTER — Other Ambulatory Visit: Payer: Self-pay | Admitting: *Deleted

## 2019-01-09 DIAGNOSIS — E782 Mixed hyperlipidemia: Secondary | ICD-10-CM

## 2019-01-09 DIAGNOSIS — J45998 Other asthma: Secondary | ICD-10-CM

## 2019-01-09 DIAGNOSIS — Z5181 Encounter for therapeutic drug level monitoring: Secondary | ICD-10-CM

## 2019-01-09 MED FILL — FLOVENT HFA 220 MCG INHALER: 220 | 90 days supply | Qty: 36 | Fill #1

## 2019-01-09 MED FILL — FLUTICASONE PROP 50 MCG SPR: 50 | 90 days supply | Qty: 48 | Fill #1

## 2019-01-09 NOTE — Telephone Encounter (Signed)
Received fax from [place] for Flovent [facility] 297mcg inhaler pt. Last. Seen 12/19/18.

## 2019-01-09 NOTE — Telephone Encounter (Signed)
Called pharmacy and this was sent in error.

## 2019-01-09 NOTE — Telephone Encounter (Signed)
This was written in 06/2018 for year supply (should have been 3 mo supply, with 3 refills)

## 2019-01-09 NOTE — Telephone Encounter (Signed)
Is this okay to refill? 

## 2019-01-11 ENCOUNTER — Encounter: Payer: Self-pay | Admitting: Family Medicine

## 2019-01-11 ENCOUNTER — Other Ambulatory Visit: Payer: Medicare Other

## 2019-01-11 ENCOUNTER — Other Ambulatory Visit: Payer: Self-pay

## 2019-01-11 DIAGNOSIS — Z5181 Encounter for therapeutic drug level monitoring: Secondary | ICD-10-CM

## 2019-01-11 DIAGNOSIS — E782 Mixed hyperlipidemia: Secondary | ICD-10-CM

## 2019-01-11 LAB — COMPREHENSIVE METABOLIC PANEL
ALT: 19 IU/L (ref 0–32)
AST: 21 IU/L (ref 0–40)
Albumin/Globulin Ratio: 2.3 — ABNORMAL HIGH (ref 1.2–2.2)
Albumin: 4.4 g/dL (ref 3.8–4.8)
Alkaline Phosphatase: 91 IU/L (ref 39–117)
BUN/Creatinine Ratio: 24 (ref 12–28)
BUN: 15 mg/dL (ref 8–27)
Bilirubin Total: 0.3 mg/dL (ref 0.0–1.2)
CO2: 24 mmol/L (ref 20–29)
Calcium: 9.5 mg/dL (ref 8.7–10.3)
Chloride: 107 mmol/L — ABNORMAL HIGH (ref 96–106)
Creatinine, Ser: 0.62 mg/dL (ref 0.57–1.00)
GFR calc Af Amer: 106 mL/min/{1.73_m2} (ref >59)
GFR calc non Af Amer: 92 mL/min/{1.73_m2} (ref >59)
Globulin, Total: 1.9 g/dL (ref 1.5–4.5)
Glucose: 94 mg/dL (ref 65–99)
Potassium: 4.9 mmol/L (ref 3.5–5.2)
Sodium: 144 mmol/L (ref 134–144)
Total Protein: 6.3 g/dL (ref 6.0–8.5)

## 2019-01-11 LAB — CBC WITH DIFFERENTIAL/PLATELET
Basophils Absolute: 0 10*3/uL (ref 0.0–0.2)
Basos: 1 %
EOS (ABSOLUTE): 0.1 10*3/uL (ref 0.0–0.4)
Eos: 2 %
Hematocrit: 40.8 % (ref 34.0–46.6)
Hemoglobin: 13.7 g/dL (ref 11.1–15.9)
Immature Grans (Abs): 0.1 10*3/uL (ref 0.0–0.1)
Immature Granulocytes: 1 %
Lymphocytes Absolute: 2.6 10*3/uL (ref 0.7–3.1)
Lymphs: 43 %
MCH: 32.2 pg (ref 26.6–33.0)
MCHC: 33.6 g/dL (ref 31.5–35.7)
MCV: 96 fL (ref 79–97)
Monocytes Absolute: 0.6 10*3/uL (ref 0.1–0.9)
Monocytes: 10 %
Neutrophils Absolute: 2.7 10*3/uL (ref 1.4–7.0)
Neutrophils: 43 %
Platelets: 158 10*3/uL (ref 150–450)
RBC: 4.25 x10E6/uL (ref 3.77–5.28)
RDW: 13.5 % (ref 11.7–15.4)
WBC: 6.1 10*3/uL (ref 3.4–10.8)

## 2019-01-11 LAB — LIPID PANEL
Chol/HDL Ratio: 3.4 ratio (ref 0.0–4.4)
Cholesterol, Total: 162 mg/dL (ref 100–199)
HDL: 47 mg/dL (ref >39)
LDL Chol Calc (NIH): 85 mg/dL (ref 0–99)
Triglycerides: 173 mg/dL — ABNORMAL HIGH (ref 0–149)
VLDL Cholesterol Cal: 30 mg/dL (ref 5–40)

## 2019-01-14 NOTE — Progress Notes (Signed)
Start time: 12:12  Cut off after 3 minutes, finished by phone. End time:   12:47  Virtual Visit via Video Note  I connected with Beverly [name] on 01/16/2019 by a video enabled telemedicine application and verified that I am speaking with the correct person using two identifiers. Visit was converted to telephone visit after the video froze and call was dropped.  Location: Patient: home, in room alone (sister is outside) Provider: office   I discussed the limitations of evaluation and management by telemedicine and the availability of in person appointments. The patient expressed understanding and agreed to proceed.  History of Present Illness:  Chief Complaint  Patient presents with  . Hyperlipidemia    VIRTUAL med check. No new concerns. Had mamm[facility] at [facility] in Oct. I wil call and get results. About a week after her brow lift she starting having more back pain than usual-pain going down her roght outer thight to her knee and inner portion of leg into heel. (Saw Dr.Phillips and he put her on dose pak x 10 days and helped).    Patient presents for 6 month med check.  She had labs done prior to her visit, see below.  She had a fall and fractured her nose in 07/2018 (leaned onto a chair which collapsed backwards, and hit nose on the windowsill). She has seen Dr. Melford Aase closed reduction of nasal fracture 08/2018 and also underwent brow lift in 09/2018.  Asthmaand allergies:She uses Flovent and Singulair regularly without side effects.Hasn't needed any albuterol since earlier this month when she was treated for bronchitis. She stays on zyrtecor claritin (switches back and forth every 90d, currently taking claritin), and singulair year-round. She also uses Flonase daily, as well as Astelin. She uses short-acting sudafed prn sinus pain. She is doing very well on this current regimen.  She had change in color of her phlegm and was treated for bronchitis with doxycycline earlier this  month. She is feeling much better, back to baseline.  Gets some phlegm, but no longer discolored. No longer needing albuterol.  Hyperlipidemia follow-up: Patient is reportedly following a low-fat, low cholesterol diet. Compliant with atorvastatin and denies any side effects. She is taking Lovaza--tolerating it much better than fish oil (which caused gas).  Postmenopausal symptoms--significantly improved since starting Estrace. She never tried cutting back (as previously recommended to try, when the weather was cooler). She hasn't tried this yet. Wakes up with her pajama tops damp most mornings.  She perspires during the day frequently as well. Last mammogram was 11/2018 (we didn't get report).  Estrace will last till end of December  GERD: Doing well on Nexium. Hasn't missed pills, eats at 6, hasn't had any problems recently (previously reported having recurrent heartburn if she misses a pill, or if she eats out in a restaurant, later at night, infrequent). Denies dysphagia.  Depression: Dr. Hardin Negus increased her Paxil dose to 17m a couple of weeks ago (from 360m.  Moods have been worse, having her sister live with her, with a lot of medical issues.  She has noticed improvement since the increased dose. Denies side effects.   Sister is currently staying with her (since October), has had spinal compression fractures.  [place] and she is coming to see Vickie in the his office later this week.  OSA: wear appliance from Dr. FuToy Cookeyeels refreshed in the mornings. She takes a nap occasionally during the day ("not much else to do").  Denies feeling sleepy while reading, driving.  She was sent to cardiology 2 years ago after an episode of chest pain. She saw the NP, had stress myovue test which was low risk. She saw Dr. Johnsie Cancel in follow-up in 09/2016. She has had no further chest pain. She has sinus bradycardia with 1st degree AV block  and incomplete RBBB.They had briefly discussed possibility of PPM in future but currently asymptomatic. Denies lightheadedness, dizziness or syncope. She walks regularly (3 miles with her daughter (on the phone), several times/week), no issues with tachycardia, palpitations, dyspnea on exertion.  Chronic pain:  Doing well on current regimen with Dr. Hardin Negus. She had a recent flare of back pain with radiculopathy which responded to a steroid course.  Mild osteopenia--T-1.2 at L fem neck, noted on DEXA 11/2013. Hasn't had repeated since.  No regular weight-bearing exercise.   PMH, PSH, SH reviewed  Outpatient Encounter Medications as of 01/16/2019  Medication Sig Note  . acetaminophen (TYLENOL) 500 MG tablet Take 1,000 mg by mouth as needed. Reported on 05/15/2015   . aspirin EC 81 MG tablet Take 81 mg by mouth daily.   Marland Kitchen atorvastatin (LIPITOR) 40 MG tablet Take 1 tablet (40 mg total) by mouth daily.   . Azelastine HCl 137 MCG/SPRAY SOLN Place 2 Pump into both nostrils 2 (two) times a day.   . Calcium Carbonate-Vitamin D (CALCIUM 600+D) 600-200 MG-UNIT TABS Take 1 tablet by mouth 2 (two) times daily.     . celecoxib (CELEBREX) 200 MG capsule Take 1 capsule (200 mg total) by mouth 2 (two) times daily.   . diclofenac Sodium (VOLTAREN) 1 % GEL APPLY 2 4 GRAM TO SKIN FOUR TIMES A DAY AS NEEDED FOR PAIN   . esomeprazole (NEXIUM) 40 MG capsule Take one daily   . estradiol (ESTRACE) 0.5 MG tablet TAKE 1 TABLET BY MOUTH ONCE DAILY   . fentaNYL (DURAGESIC - DOSED MCG/HR) 25 MCG/HR Place 1 patch onto the skin every 3 (three) days.   10/10/2018: Lt abd on 10/09/18  . Fluorouracil (TOLAK) 4 % CREA Apply topically every 3 (three) days.   . fluticasone (FLONASE) 50 MCG/ACT nasal spray PLACE 2 SPRAYS INTO THE NOSE DAILY.   . fluticasone (FLOVENT HFA) 220 MCG/ACT inhaler INHALE 2 PUFFS INTO THE LUNGS 2 (TWO) TIMES DAILY.   Marland Kitchen gabapentin (NEURONTIN) 800 MG tablet Take 800 mg by mouth at bedtime.    Marland Kitchen loratadine  (CLARITIN) 10 MG tablet Take 10 mg by mouth daily.   . methocarbamol (ROBAXIN) 500 MG tablet Take 500 mg by mouth daily with breakfast.   . montelukast (SINGULAIR) 10 MG tablet Take 1 tablet (10 mg total) by mouth at bedtime.   . Multiple Vitamins-Minerals (MULTIVITAMIN WITH MINERALS) tablet Take 1 tablet by mouth daily.   Marland Kitchen omega-3 acid ethyl esters (LOVAZA) 1 g capsule TAKE 2 CAPSULES BY MOUTH 2 TIMES DAILY.   Marland Kitchen OVER THE COUNTER MEDICATION Guaifenesin 626m-Take as needed.   .Marland KitchenPARoxetine (PAXIL) 30 MG tablet Take 60 mg by mouth daily.    . pseudoephedrine (SUDAFED) 30 MG tablet Take 30 mg by mouth every 4 (four) hours as needed for congestion.   . [DISCONTINUED] atorvastatin (LIPITOR) 40 MG tablet Take 1 tablet (40 mg total) by mouth daily.   . [DISCONTINUED] celecoxib (CELEBREX) 200 MG capsule Take 1 capsule (200 mg total) by mouth 2 (two) times daily.   . [DISCONTINUED] cetirizine (ZYRTEC) 10 MG tablet Take 10 mg by mouth daily.     .Marland Kitchen  albuterol (PROVENTIL HFA;VENTOLIN HFA) 108 (90 Base) MCG/ACT inhaler Inhale 2 puffs into the lungs every 6 (six) hours as needed for wheezing or shortness of breath. (Patient not taking: Reported on 01/16/2019)   . HYDROcodone-acetaminophen (NORCO) 7.5-325 MG tablet Take 1 tablet by mouth every 6 (six) hours as needed. for pain   . loperamide (IMODIUM A-D) 2 MG tablet Take 2 mg by mouth 4 (four) times daily as needed for diarrhea or loose stools. 06/10/2017: Uses prn diarrhea from IBS  . [DISCONTINUED] doxycycline (VIBRA-TABS) 100 MG tablet Take 1 tablet (100 mg total) by mouth 2 (two) times daily.    No facility-administered encounter medications on file as of 01/16/2019.    Allergies  Allergen Reactions  . Adhesive [Tape] Rash  . Codeine Rash    ROS: Denies fever, chills, headaches, dizziness, chest pain, shortness of breath, edema. Denies URI symptoms. +chronic back pain. Infrequent, slight stress urinary incontinence with cough(when bladder is  full) Denies hot flashes since on HRT, very mild night sweats. Allergies/asthma are well controlled per HPI Moods are improved since paxil dose recently increased. Sees dermatologist regularly.    Observations/Objective:  Ht 5' 5"  (1.651 m)   Wt 165 lb (74.8 kg)   BMI 27.46 kg/m    Wt Readings from Last 3 Encounters:  12/19/18 165 lb (74.8 kg)  11/17/18 168 lb (76.2 kg)  10/25/18 167 lb 8 oz (76 kg)   Pleasant, well-appearing female. She is alert, oriented, and in good spirits. Normal speech. Mood is improved, depression improving.  Full range of affect. She is speaking comfortably, no cough or throat-clearing. Exam is limited due to virtual nature of the visit.    Chemistry      Component Value Date/Time   NA 144 01/11/2019 0815   K 4.9 01/11/2019 0815   CL 107 (H) 01/11/2019 0815   CO2 24 01/11/2019 0815   BUN 15 01/11/2019 0815   CREATININE 0.62 01/11/2019 0815   CREATININE 0.64 11/16/2016 1142      Component Value Date/Time   CALCIUM 9.5 01/11/2019 0815   ALKPHOS 91 01/11/2019 0815   AST 21 01/11/2019 0815   ALT 19 01/11/2019 0815   BILITOT 0.3 01/11/2019 0815     Fasting glu 94  Lab Results  Component Value Date   WBC 6.1 01/11/2019   HGB 13.7 01/11/2019   HCT 40.8 01/11/2019   MCV 96 01/11/2019   PLT 158 01/11/2019   Lab Results  Component Value Date   CHOL 162 01/11/2019   HDL 47 01/11/2019   LDLCALC 85 01/11/2019   TRIG 173 (H) 01/11/2019   CHOLHDL 3.4 01/11/2019     Assessment and Plan:  Mixed hyperlipidemia - elevated TG noted on recent labs (though improved).  Educated re: diet; continue current regimen - Plan: atorvastatin (LIPITOR) 40 MG tablet  Asthma in remission - cont current regimen  Gastroesophageal reflux disease without esophagitis - well controlled  Sinus bradycardia - stable, asymptomatic  First degree AV block  Chronic back pain, unspecified back location, unspecified back pain laterality  Perennial allergic  rhinitis - cont current regimen  Osteopenia of neck of left femur - last DEXA showed T-1.2; rec repeat DEXA (has been 5 years), to be done at [place]. Disc Ca, D, wt-bearing exercise  Osteoarthritis of multiple joints, unspecified osteoarthritis type - would much prefer her to decrease celebrex to qd, if possible - Plan: celecoxib (CELEBREX) 200 MG capsule  Mild osteopenia--last had DEXA 11/2013.  rec repeat (at [facility]).  Pt advised to call and schedule (and have them fax order)  Discussed tapering estrogen with cooler weather. Discussed weight-bearing exercise at least 2-3x/week.  F/u in 6 mos for CPE/AWV, with fasting labs prior c-met, lipid  Follow Up Instructions:    I discussed the assessment and treatment plan with the patient. The patient was provided an opportunity to ask questions and all were answered. The patient agreed with the plan and demonstrated an understanding of the instructions.   The patient was advised to call back or seek an in-person evaluation if the symptoms worsen or if the condition fails to improve as anticipated.  I provided 35 minutes of non-face-to-face time during this encounter.   Vikki Ports, MD

## 2019-01-16 ENCOUNTER — Ambulatory Visit: Payer: Medicare Other | Admitting: Family Medicine

## 2019-01-16 ENCOUNTER — Encounter: Payer: Self-pay | Admitting: Family Medicine

## 2019-01-16 ENCOUNTER — Other Ambulatory Visit: Payer: Self-pay

## 2019-01-16 VITALS — Ht 65.0 in | Wt 165.0 lb

## 2019-01-16 DIAGNOSIS — J45998 Other asthma: Secondary | ICD-10-CM | POA: Diagnosis not present

## 2019-01-16 DIAGNOSIS — M549 Dorsalgia, unspecified: Secondary | ICD-10-CM

## 2019-01-16 DIAGNOSIS — K219 Gastro-esophageal reflux disease without esophagitis: Secondary | ICD-10-CM

## 2019-01-16 DIAGNOSIS — Z5181 Encounter for therapeutic drug level monitoring: Secondary | ICD-10-CM

## 2019-01-16 DIAGNOSIS — I440 Atrioventricular block, first degree: Secondary | ICD-10-CM

## 2019-01-16 DIAGNOSIS — M159 Polyosteoarthritis, unspecified: Secondary | ICD-10-CM

## 2019-01-16 DIAGNOSIS — R001 Bradycardia, unspecified: Secondary | ICD-10-CM | POA: Diagnosis not present

## 2019-01-16 DIAGNOSIS — M85852 Other specified disorders of bone density and structure, left thigh: Secondary | ICD-10-CM

## 2019-01-16 DIAGNOSIS — E782 Mixed hyperlipidemia: Secondary | ICD-10-CM

## 2019-01-16 DIAGNOSIS — G8929 Other chronic pain: Secondary | ICD-10-CM

## 2019-01-16 DIAGNOSIS — J3089 Other allergic rhinitis: Secondary | ICD-10-CM

## 2019-01-16 MED ORDER — CELECOXIB 200 MG PO CAPS: 200.0000 mg | ORAL_CAPSULE | Freq: Two times a day (BID) | ORAL | 1 refills | Status: DC

## 2019-01-16 MED ORDER — ATORVASTATIN CALCIUM 40 MG PO TABS: 40.0000 mg | ORAL_TABLET | Freq: Every day | ORAL | 1 refills | Status: DC

## 2019-01-16 MED FILL — PARoxetine HCL 40 MG TABS: 40 | 90 days supply | Qty: 135 | Fill #0

## 2019-01-16 NOTE — Patient Instructions (Addendum)
We discussed trying to gradually decrease your Estrace dose as the weather gets cooler.  Consider cutting back to 1/2 tablet alternating with full tablet, followed by cutting back to 1/2 tablet daily, and gradually decreasing from there.  If you aren't able to get off entirely, stay at the lowest effective dose that you can tolerate.

## 2019-01-17 NOTE — Progress Notes (Signed)
Done

## 2019-01-18 ENCOUNTER — Encounter: Payer: Self-pay | Admitting: *Deleted

## 2019-01-29 MED FILL — GABAPENTIN 800 MG TABS: 800 | 90 days supply | Qty: 90 | Fill #1

## 2019-02-07 ENCOUNTER — Encounter: Payer: Self-pay | Admitting: Family Medicine

## 2019-02-13 MED FILL — IMIQUIMOD 5% CREAM PACKET: 5 | 14 days supply | Qty: 6 | Fill #1

## 2019-02-13 MED FILL — MONTELUKAST SOD 10 MG TAB: 10 | 90 days supply | Qty: 90 | Fill #2

## 2019-02-13 MED FILL — AZELASTINE HCL 137 MCG SPRY: 0.1 | 75 days supply | Qty: 90 | Fill #3

## 2019-02-13 MED FILL — ATORVASTATIN 40 MG TABLET: 40 | 90 days supply | Qty: 90 | Fill #0

## 2019-02-17 DIAGNOSIS — M75101 Unspecified rotator cuff tear or rupture of right shoulder, not specified as traumatic: Secondary | ICD-10-CM

## 2019-02-17 HISTORY — DX: Unspecified rotator cuff tear or rupture of right shoulder, not specified as traumatic: M75.101

## 2019-03-03 ENCOUNTER — Other Ambulatory Visit: Payer: Self-pay | Admitting: Family Medicine

## 2019-03-03 MED FILL — OMEGA-3 ETHYL ESTERS 1 GM C: 1 | 90 days supply | Qty: 360 | Fill #0

## 2019-03-09 ENCOUNTER — Other Ambulatory Visit: Payer: Self-pay | Admitting: Orthopedic Surgery

## 2019-03-09 DIAGNOSIS — M25511 Pain in right shoulder: Secondary | ICD-10-CM

## 2019-03-11 MED FILL — METHOCARBAMOL 500 MG TABS: 500 | 60 days supply | Qty: 180 | Fill #1

## 2019-03-17 ENCOUNTER — Ambulatory Visit
Admission: RE | Admit: 2019-03-17 | Discharge: 2019-03-17 | Disposition: A | Discharge status: Home / Self Care | Payer: Medicare PPO | Source: Ambulatory Visit | Attending: Orthopedic Surgery | Admitting: Orthopedic Surgery

## 2019-03-17 ENCOUNTER — Other Ambulatory Visit: Payer: Self-pay

## 2019-03-17 ENCOUNTER — Encounter: Payer: Self-pay | Admitting: Family Medicine

## 2019-03-17 DIAGNOSIS — M25511 Pain in right shoulder: Secondary | ICD-10-CM

## 2019-03-20 MED FILL — CELECOXIB 200 MG CAP: 200 | 90 days supply | Qty: 180 | Fill #0

## 2019-03-22 MED FILL — HYDROCODON-APAP 7.5-325: 7.5-325 | 25 days supply | Qty: 100 | Fill #0

## 2019-03-23 ENCOUNTER — Encounter: Payer: Self-pay | Admitting: Family Medicine

## 2019-03-27 ENCOUNTER — Encounter: Payer: Self-pay | Admitting: Family Medicine

## 2019-03-28 ENCOUNTER — Other Ambulatory Visit: Payer: Self-pay | Admitting: Orthopedic Surgery

## 2019-03-28 DIAGNOSIS — M19011 Primary osteoarthritis, right shoulder: Secondary | ICD-10-CM

## 2019-03-29 NOTE — Progress Notes (Signed)
Chief Complaint  Patient presents with  . Consult    consult for surgical clearance for right shoulder surgery with Dr. Victorino December @ [facility].     Patient presents for surgical clearance for repair of right rotator cuff tear. She tripped up some stairs and landed on her elbow, which injured her shoulder. Per Dr. Alvan Dame, MRI showed tear of infraspinatus and supraspinatus, and dislocation of long head of bicep tendon. He sent her to Dr. Stann Mainland for shoulder surgery.  She was asked to come in for clearance because her visits have been virtual due to the pandemic.  Needs in-person cardiac evaluation prior to surgery.  She recently had R trochanteric bursitis treated with cortisone injection per ortho, which feels better.  Most recent surgeries were cosmetic, by Dr. Melford Aase closed reduction of nasal fracture 08/2018 and also underwent brow lift in 09/2018. Left shoulder surgery was 02/2018.  She had no problems with anesthesia.  Hyperlipidemia follow-up: Patient is reportedly following a low-fat, low cholesterol diet. Compliant with atorvastatin and denies any side effects. She is taking Lovaza--tolerating it much better than fish oil (which caused gas). Lab Results  Component Value Date   CHOL 162 01/11/2019   HDL 47 01/11/2019   LDLCALC 85 01/11/2019   TRIG 173 (H) 01/11/2019   CHOLHDL 3.4 01/11/2019    She was sent to cardiology2 years agoafter an episode of chest pain. She saw the NP, had stress myovue test which was low risk. She saw Dr. Johnsie Cancel in follow-up in 09/2016. She has had no further chest pain. She has sinus bradycardia with 1st degree AV block and incomplete RBBB.They had briefly discussed possibility of PPM in future but currently asymptomatic. Denies lightheadedness, dizziness or syncope. She walks regularly, 3 miles several times/week (weather permitting) and has no issues with tachycardia, palpitations, dyspnea on exertion or chest pain.  Asthmaand allergies:She uses  Flovent and Singulair regularly without side effects.She needed to use albuterol this morning (heard wheezing), makes her cough and is able to get up the phlegm.  Gets up little pieces of thick phlegm (slightly yellow). Recently has used in the mornings, never needing to use as rescue later in the day. She stays on zyrtecor claritin (switches back and forth every 90d, currently taking claritin), and singulair year-round, along with daily Flonase and Astelin. Also using sudafed (4 hour, taking it twice daily) and mucinex 12 hours BID currently. She is doing very well on this current regimen.  OSA: wear appliance from Dr. Toy Cookey.Feels refreshed in the mornings.Sleeping well at night. Denies feeling sleepy while reading, driving.  Moods are better since her sister went home [facility]).  She has a lot of family stress, but is now taking better care of herself.  Postmenopausal symptoms--she cut back to 1/2 tablet of estrace.  She continues to have some sweats, but reports it isn't any different than when she took the full tablet.  Has been on 1/2 tablet x 1 month. PMH, PSH, SH reviewed  Has a SCC on her L hand, getting removed next week by Dr. Denna Haggard.  PMH, PSH, SH and FH reviewed  Outpatient Encounter Medications as of 03/30/2019  Medication Sig Note  . albuterol (PROVENTIL HFA;VENTOLIN HFA) 108 (90 Base) MCG/ACT inhaler Inhale 2 puffs into the lungs every 6 (six) hours as needed for wheezing or shortness of breath.   Marland Kitchen aspirin EC 81 MG tablet Take 81 mg by mouth daily.   Marland Kitchen atorvastatin (LIPITOR) 40 MG tablet Take 1 tablet (40 mg total)  by mouth daily.   . Azelastine HCl 137 MCG/SPRAY SOLN Place 2 Pump into both nostrils 2 (two) times a day.   . Calcium Carbonate-Vitamin D (CALCIUM 600+D) 600-200 MG-UNIT TABS Take 1 tablet by mouth 2 (two) times daily.     . celecoxib (CELEBREX) 200 MG capsule Take 1 capsule (200 mg total) by mouth 2 (two) times daily.   Marland Kitchen esomeprazole (NEXIUM) 40 MG capsule Take  one daily   . estradiol (ESTRACE) 0.5 MG tablet TAKE 1 TABLET BY MOUTH ONCE DAILY 03/30/2019: 1/2 tablet  . fentaNYL (DURAGESIC - DOSED MCG/HR) 25 MCG/HR Place 1 patch onto the skin every 3 (three) days.   10/10/2018: Lt abd on 10/09/18  . Fluorouracil (TOLAK) 4 % CREA Apply topically every 3 (three) days.   . fluticasone (FLONASE) 50 MCG/ACT nasal spray PLACE 2 SPRAYS INTO THE NOSE DAILY.   . fluticasone (FLOVENT HFA) 220 MCG/ACT inhaler INHALE 2 PUFFS INTO THE LUNGS 2 (TWO) TIMES DAILY.   Marland Kitchen gabapentin (NEURONTIN) 800 MG tablet Take 800 mg by mouth at bedtime.    Marland Kitchen HYDROcodone-acetaminophen (NORCO) 7.5-325 MG tablet Take 1 tablet by mouth every 6 (six) hours as needed. for pain 03/30/2019: Takes BID for her shoulder pain  . loratadine (CLARITIN) 10 MG tablet Take 10 mg by mouth daily.   . methocarbamol (ROBAXIN) 500 MG tablet Take 500 mg by mouth daily with breakfast. 03/30/2019: Takes BID  . montelukast (SINGULAIR) 10 MG tablet Take 1 tablet (10 mg total) by mouth at bedtime.   . Multiple Vitamins-Minerals (MULTIVITAMIN WITH MINERALS) tablet Take 1 tablet by mouth daily.   . niacinamide 500 MG tablet Take 500 mg by mouth 2 (two) times daily with a meal.   . omega-3 acid ethyl esters (LOVAZA) 1 g capsule TAKE 2 CAPSULES BY MOUTH 2 TIMES DAILY.   Marland Kitchen OVER THE COUNTER MEDICATION Guaifenesin 600mg -Take as needed.   Marland Kitchen PARoxetine (PAXIL) 30 MG tablet Take 60 mg by mouth daily.    . pseudoephedrine (SUDAFED) 30 MG tablet Take 30 mg by mouth every 4 (four) hours as needed for congestion.   Marland Kitchen acetaminophen (TYLENOL) 500 MG tablet Take 1,000 mg by mouth as needed. Reported on 05/15/2015   . diclofenac Sodium (VOLTAREN) 1 % GEL APPLY 2 4 GRAM TO SKIN FOUR TIMES A DAY AS NEEDED FOR PAIN   . loperamide (IMODIUM A-D) 2 MG tablet Take 2 mg by mouth 4 (four) times daily as needed for diarrhea or loose stools. 06/10/2017: Uses prn diarrhea from IBS   No facility-administered encounter medications on file as of  03/30/2019.    ROS:  No fever, chills, URI symptoms (just chronic sinus issues, morning cough), headaches, dizziness, syncope, shortness of breath, chest pain, palpitations.  No GI or GU complaints. No bleeding, bruising, rash.  Moods are stable/improved (depression). Hip pain/bursitis improved s/p injection. R shoulder pain and weakness.   PHYSICAL EXAM:  BP (!) 160/70   Pulse 64   Temp (!) 97.3 F (36.3 C) (Other (Comment))   Ht 5\' 5"  (1.651 m)   Wt 170 lb (77.1 kg)   BMI 28.29 kg/m   Wt Readings from Last 3 Encounters:  03/30/19 170 lb (77.1 kg)  01/16/19 165 lb (74.8 kg)  12/19/18 165 lb (74.8 kg)    Well appearing, pleasant female, in no distress HEENT: conjunctiva and sclera are clear, EOMI. Wearing mask due to COVID-19 pandemic.  Sinuses are nontender.  She did cough up a small amount of phlegm--very light  yellow, small amount, slightly thick with loose/watery on outside. Neck: no lymphadenopathy, or mass, no bruit Heart: regular rhythm (rate 55-60), no murmur, rub or gallop.  Lungs: good air movement. No wheezes, rales, ronchi Abdomen: soft, nontender, no organomegaly or mass Back: no CVA tenderness Extremities: no edema, 2+ pulses.  Shoulder exam not performed. Skin: normal turgor, no rash (lesions near 2nd MCP of left hand, to be removed next week. Psych: normal mood, affect, hygiene and grooming. She is in good spirits today, very talkative, slightly excitable (which could also contribute to higher BP). Neuro: alert and oriented, normal gait.  EKG: sinus brady, rate 55.  1st degree AV block.  Poss anterolat infarct. Compared to EKG done 11/2017--similar, except further decrease in amplitude of R waves in V5 and V6 noted.   ASSESSMENT/PLAN:  Pre-op evaluation  First degree AV block - unchanged/stable  Sinus bradycardia - asymptomatic, stable  Asthma in remission  Mixed hyperlipidemia  Traumatic tear of right rotator cuff, unspecified tear extent,  subsequent encounter  Elevated blood-pressure reading without diagnosis of hypertension - in pain and taking decongestants regularly.  To cut back on sudafed and monitor BP. Okay for surgery if BP's 140 or less  Allergic rhinitis, unspecified seasonality, unspecified trigger - her "wheezing" she hears in morning are bronchial sounds, just needs to get up the phlegm; can avoid the morning albuterol. No e/o infection  Surgical clearance form signed--pt to stop sudafed (discussed coricidin instead), monitor blood pressure elsewhere, and if systolic [id number] or less, okay for surgery.

## 2019-03-30 ENCOUNTER — Other Ambulatory Visit: Payer: Self-pay

## 2019-03-30 ENCOUNTER — Ambulatory Visit: Payer: Medicare PPO | Admitting: Family Medicine

## 2019-03-30 ENCOUNTER — Encounter: Payer: Self-pay | Admitting: Family Medicine

## 2019-03-30 VITALS — BP 160/70 | HR 64 | Temp 97.3°F | Ht 65.0 in | Wt 170.0 lb

## 2019-03-30 DIAGNOSIS — J45998 Other asthma: Secondary | ICD-10-CM

## 2019-03-30 DIAGNOSIS — R001 Bradycardia, unspecified: Secondary | ICD-10-CM | POA: Diagnosis not present

## 2019-03-30 DIAGNOSIS — I440 Atrioventricular block, first degree: Secondary | ICD-10-CM

## 2019-03-30 DIAGNOSIS — S46011D Strain of muscle(s) and tendon(s) of the rotator cuff of right shoulder, subsequent encounter: Secondary | ICD-10-CM

## 2019-03-30 DIAGNOSIS — E782 Mixed hyperlipidemia: Secondary | ICD-10-CM

## 2019-03-30 DIAGNOSIS — Z01818 Encounter for other preprocedural examination: Secondary | ICD-10-CM | POA: Diagnosis not present

## 2019-03-30 DIAGNOSIS — R030 Elevated blood-pressure reading, without diagnosis of hypertension: Secondary | ICD-10-CM | POA: Diagnosis not present

## 2019-03-30 DIAGNOSIS — J309 Allergic rhinitis, unspecified: Secondary | ICD-10-CM

## 2019-03-30 NOTE — Addendum Note (Signed)
Addended by: Carolee Rota F on: 03/30/2019 02:04 PM   Modules accepted: Orders

## 2019-03-30 NOTE — Patient Instructions (Signed)
Your blood pressure was elevated today.  There may be a combination of pain and sudafed contributing.  Please cut back on the sudafed (consider using Coridicin HBP products--look at the ingredients to ensure that you aren't overlapping any, such as acetaminophen, guaifenesin or dextromethorphan).  Please try and monitor your blood pressure elsewhere, and send me a note in the next couple of weeks--hoping to see 140 or less for the systolic BP.  Here is some additional information regarding diet and blood pressure.   DASH Eating Plan DASH stands for "Dietary Approaches to Stop Hypertension." The DASH eating plan is a healthy eating plan that has been shown to reduce high blood pressure (hypertension). It may also reduce your risk for type 2 diabetes, heart disease, and stroke. The DASH eating plan may also help with weight loss. What are tips for following this plan?  General guidelines  Avoid eating more than 2,300 mg (milligrams) of salt (sodium) a day. If you have hypertension, you may need to reduce your sodium intake to 1,500 mg a day.  Limit alcohol intake to no more than 1 drink a day for nonpregnant women and 2 drinks a day for men. One drink equals 12 oz of beer, 5 oz of wine, or 1 oz of hard liquor.  Work with your health care provider to maintain a healthy body weight or to lose weight. Ask what an ideal weight is for you.  Get at least 30 minutes of exercise that causes your heart to beat faster (aerobic exercise) most days of the week. Activities may include walking, swimming, or biking.  Work with your health care provider or diet and nutrition specialist (dietitian) to adjust your eating plan to your individual calorie needs. Reading food labels   Check food labels for the amount of sodium per serving. Choose foods with less than 5 percent of the Daily Value of sodium. Generally, foods with less than 300 mg of sodium per serving fit into this eating plan.  To find whole  grains, look for the word "whole" as the first word in the ingredient list. Shopping  Buy products labeled as "low-sodium" or "no salt added."  Buy fresh foods. Avoid canned foods and premade or frozen meals. Cooking  Avoid adding salt when cooking. Use salt-free seasonings or herbs instead of table salt or sea salt. Check with your health care provider or pharmacist before using salt substitutes.  Do not fry foods. Cook foods using healthy methods such as baking, boiling, grilling, and broiling instead.  Cook with heart-healthy oils, such as olive, canola, soybean, or sunflower oil. Meal planning  Eat a balanced diet that includes: ? 5 or more servings of fruits and vegetables each day. At each meal, try to fill half of your plate with fruits and vegetables. ? Up to 6-8 servings of whole grains each day. ? Less than 6 oz of lean meat, poultry, or fish each day. A 3-oz serving of meat is about the same size as a deck of cards. One egg equals 1 oz. ? 2 servings of low-fat dairy each day. ? A serving of nuts, seeds, or beans 5 times each week. ? Heart-healthy fats. Healthy fats called Omega-3 fatty acids are found in foods such as flaxseeds and coldwater fish, like sardines, salmon, and mackerel.  Limit how much you eat of the following: ? Canned or prepackaged foods. ? Food that is high in trans fat, such as fried foods. ? Food that is high in saturated fat,  such as fatty meat. ? Sweets, desserts, sugary drinks, and other foods with added sugar. ? Full-fat dairy products.  Do not salt foods before eating.  Try to eat at least 2 vegetarian meals each week.  Eat more home-cooked food and less restaurant, buffet, and fast food.  When eating at a restaurant, ask that your food be prepared with less salt or no salt, if possible. What foods are recommended? The items listed may not be a complete list. Talk with your dietitian about what dietary choices are best for you. Grains  Whole-grain or whole-wheat bread. Whole-grain or whole-wheat pasta. Brown rice. [place]ena Morrow. Bulgur. Whole-grain and low-sodium cereals. Pita bread. Low-fat, low-sodium crackers. Whole-wheat flour tortillas. Vegetables Fresh or frozen vegetables (raw, steamed, roasted, or grilled). Low-sodium or reduced-sodium tomato and vegetable juice. Low-sodium or reduced-sodium tomato sauce and tomato paste. Low-sodium or reduced-sodium canned vegetables. Fruits All fresh, dried, or frozen fruit. Canned fruit in natural juice (without added sugar). Meat and other protein foods Skinless chicken or [place]. Ground chicken or [place]. Pork with fat trimmed off. Fish and seafood. Egg whites. Dried beans, peas, or lentils. Unsalted nuts, nut butters, and seeds. Unsalted canned beans. Lean cuts of beef with fat trimmed off. Low-sodium, lean deli meat. Dairy Low-fat (1%) or fat-free (skim) milk. Fat-free, low-fat, or reduced-fat cheeses. Nonfat, low-sodium ricotta or cottage cheese. Low-fat or nonfat yogurt. Low-fat, low-sodium cheese. Fats and oils Soft margarine without trans fats. Vegetable oil. Low-fat, reduced-fat, or light mayonnaise and salad dressings (reduced-sodium). Canola, safflower, olive, soybean, and sunflower oils. Avocado. Seasoning and other foods Herbs. Spices. Seasoning mixes without salt. Unsalted popcorn and pretzels. Fat-free sweets. What foods are not recommended? The items listed may not be a complete list. Talk with your dietitian about what dietary choices are best for you. Grains Baked goods made with fat, such as croissants, muffins, or some breads. Dry pasta or rice meal packs. Vegetables Creamed or fried vegetables. Vegetables in a cheese sauce. Regular canned vegetables (not low-sodium or reduced-sodium). Regular canned tomato sauce and paste (not low-sodium or reduced-sodium). Regular tomato and vegetable juice (not low-sodium or reduced-sodium). Angie Fava. Olives. Fruits Canned  fruit in a light or heavy syrup. Fried fruit. Fruit in cream or butter sauce. Meat and other protein foods Fatty cuts of meat. Ribs. Fried meat. Berniece Salines. Sausage. Bologna and other processed lunch meats. Salami. Fatback. Hotdogs. Bratwurst. Salted nuts and seeds. Canned beans with added salt. Canned or smoked fish. Whole eggs or egg yolks. Chicken or [place] with skin. Dairy Whole or 2% milk, cream, and half-and-half. Whole or full-fat cream cheese. Whole-fat or sweetened yogurt. Full-fat cheese. Nondairy creamers. Whipped toppings. Processed cheese and cheese spreads. Fats and oils Butter. Stick margarine. Lard. Shortening. Ghee. Bacon fat. Tropical oils, such as coconut, palm kernel, or palm oil. Seasoning and other foods Salted popcorn and pretzels. Onion salt, garlic salt, seasoned salt, table salt, and sea salt. Worcestershire sauce. Tartar sauce. Barbecue sauce. Teriyaki sauce. Soy sauce, including reduced-sodium. Steak sauce. Canned and packaged gravies. Fish sauce. Oyster sauce. Cocktail sauce. Horseradish that you find on the shelf. Ketchup. Mustard. Meat flavorings and tenderizers. Bouillon cubes. Hot sauce and Tabasco sauce. Premade or packaged marinades. Premade or packaged taco seasonings. Relishes. Regular salad dressings. Where to find more information:  National Heart, Lung, and [place]: https://wilson-eaton.com/  American Heart Association: www.heart.org Summary  The DASH eating plan is a healthy eating plan that has been shown to reduce high blood pressure (hypertension). It may also reduce your  risk for type 2 diabetes, heart disease, and stroke.  With the DASH eating plan, you should limit salt (sodium) intake to 2,300 mg a day. If you have hypertension, you may need to reduce your sodium intake to 1,500 mg a day.  When on the DASH eating plan, aim to eat more fresh fruits and vegetables, whole grains, lean proteins, low-fat dairy, and heart-healthy fats.  Work with your health  care provider or diet and nutrition specialist (dietitian) to adjust your eating plan to your individual calorie needs. This information is not intended to replace advice given to you by your health care provider. Make sure you discuss any questions you have with your health care provider. Document Revised: 01/15/2017 Document Reviewed: 01/27/2016 Elsevier Patient Education  2020 [employer].

## 2019-03-31 ENCOUNTER — Ambulatory Visit
Admission: RE | Admit: 2019-03-31 | Discharge: 2019-03-31 | Disposition: A | Discharge status: Home / Self Care | Payer: Medicare PPO | Source: Ambulatory Visit | Attending: Orthopedic Surgery | Admitting: Orthopedic Surgery

## 2019-03-31 DIAGNOSIS — M19011 Primary osteoarthritis, right shoulder: Secondary | ICD-10-CM

## 2019-04-03 ENCOUNTER — Other Ambulatory Visit: Payer: Self-pay | Admitting: Family Medicine

## 2019-04-03 DIAGNOSIS — K219 Gastro-esophageal reflux disease without esophagitis: Secondary | ICD-10-CM

## 2019-04-03 MED FILL — ESOMEPRAZOLE MAG DR 40 MG C: 40 | 90 days supply | Qty: 90 | Fill #0

## 2019-04-05 ENCOUNTER — Other Ambulatory Visit: Payer: Medicare PPO

## 2019-04-06 MED FILL — MUPIROCIN 2% OINTMENT: 2 | 20 days supply | Qty: 22 | Fill #0

## 2019-04-07 ENCOUNTER — Encounter (HOSPITAL_COMMUNITY): Payer: Self-pay

## 2019-04-07 ENCOUNTER — Encounter (HOSPITAL_COMMUNITY)
Admission: RE | Admit: 2019-04-07 | Discharge: 2019-04-07 | Disposition: A | Discharge status: Home / Self Care | Payer: Medicare PPO | Source: Ambulatory Visit | Attending: Orthopedic Surgery | Admitting: Orthopedic Surgery

## 2019-04-07 ENCOUNTER — Other Ambulatory Visit (HOSPITAL_COMMUNITY)
Admission: RE | Admit: 2019-04-07 | Discharge: 2019-04-07 | Disposition: A | Discharge status: Home / Self Care | Payer: Medicare PPO | Source: Ambulatory Visit | Attending: Orthopedic Surgery | Admitting: Orthopedic Surgery

## 2019-04-07 ENCOUNTER — Other Ambulatory Visit: Payer: Self-pay

## 2019-04-07 DIAGNOSIS — J45909 Unspecified asthma, uncomplicated: Secondary | ICD-10-CM | POA: Diagnosis not present

## 2019-04-07 DIAGNOSIS — K589 Irritable bowel syndrome without diarrhea: Secondary | ICD-10-CM | POA: Diagnosis not present

## 2019-04-07 DIAGNOSIS — M19011 Primary osteoarthritis, right shoulder: Secondary | ICD-10-CM | POA: Diagnosis not present

## 2019-04-07 DIAGNOSIS — Z7982 Long term (current) use of aspirin: Secondary | ICD-10-CM | POA: Insufficient documentation

## 2019-04-07 DIAGNOSIS — F419 Anxiety disorder, unspecified: Secondary | ICD-10-CM | POA: Insufficient documentation

## 2019-04-07 DIAGNOSIS — Z79899 Other long term (current) drug therapy: Secondary | ICD-10-CM | POA: Insufficient documentation

## 2019-04-07 DIAGNOSIS — Z20822 Contact with and (suspected) exposure to covid-19: Secondary | ICD-10-CM | POA: Insufficient documentation

## 2019-04-07 DIAGNOSIS — E559 Vitamin D deficiency, unspecified: Secondary | ICD-10-CM | POA: Insufficient documentation

## 2019-04-07 DIAGNOSIS — I340 Nonrheumatic mitral (valve) insufficiency: Secondary | ICD-10-CM | POA: Diagnosis not present

## 2019-04-07 DIAGNOSIS — Z85828 Personal history of other malignant neoplasm of skin: Secondary | ICD-10-CM | POA: Insufficient documentation

## 2019-04-07 DIAGNOSIS — G4733 Obstructive sleep apnea (adult) (pediatric): Secondary | ICD-10-CM | POA: Insufficient documentation

## 2019-04-07 DIAGNOSIS — Z01818 Encounter for other preprocedural examination: Secondary | ICD-10-CM | POA: Insufficient documentation

## 2019-04-07 DIAGNOSIS — K219 Gastro-esophageal reflux disease without esophagitis: Secondary | ICD-10-CM | POA: Insufficient documentation

## 2019-04-07 DIAGNOSIS — E785 Hyperlipidemia, unspecified: Secondary | ICD-10-CM | POA: Insufficient documentation

## 2019-04-07 HISTORY — DX: Pneumonia, unspecified organism: J18.9

## 2019-04-07 HISTORY — DX: Nausea with vomiting, unspecified: Z98.890

## 2019-04-07 HISTORY — DX: Other specified postprocedural states: R11.2

## 2019-04-07 LAB — CBC
HCT: 45.7 % (ref 36.0–46.0)
Hemoglobin: 14.7 g/dL (ref 12.0–15.0)
MCH: 33.6 pg (ref 26.0–34.0)
MCHC: 32.2 g/dL (ref 30.0–36.0)
MCV: 104.6 fL — ABNORMAL HIGH (ref 80.0–100.0)
Platelets: 200 10*3/uL (ref 150–400)
RBC: 4.37 MIL/uL (ref 3.87–5.11)
RDW: 14.9 % (ref 11.5–15.5)
WBC: 9.9 10*3/uL (ref 4.0–10.5)
nRBC: 0 % (ref 0.0–0.2)

## 2019-04-07 LAB — BASIC METABOLIC PANEL
Anion gap: 9 (ref 5–15)
BUN: 11 mg/dL (ref 8–23)
CO2: 25 mmol/L (ref 22–32)
Calcium: 9.3 mg/dL (ref 8.9–10.3)
Chloride: 106 mmol/L (ref 98–111)
Creatinine, Ser: 0.67 mg/dL (ref 0.44–1.00)
GFR calc Af Amer: >60 mL/min (ref >60)
GFR calc non Af Amer: >60 mL/min (ref >60)
Glucose, Bld: 141 mg/dL — ABNORMAL HIGH (ref 70–99)
Potassium: 3.8 mmol/L (ref 3.5–5.1)
Sodium: 140 mmol/L (ref 135–145)

## 2019-04-07 LAB — SURGICAL PCR SCREEN
MRSA, PCR: NEGATIVE
Staphylococcus aureus: NEGATIVE

## 2019-04-07 LAB — SARS CORONAVIRUS 2 (TAT 6-24 HRS): SARS Coronavirus 2: NEGATIVE

## 2019-04-07 NOTE — Progress Notes (Signed)
[place], [place] [address] Phone: [phone] Fax: [phone]  [place], [place] - [address] [place] [address] Phone: [phone] Fax: [phone]  [id number] - [place], [place]. AT [address]. [place] [address] Phone: [phone] Fax: [phone]    Your procedure is scheduled on Tuesday, February 23rd.  Report to [facility] Main Entrance "A" at 10:30 A.M., and check in at the Admitting office.  Call this number if you have problems the morning of surgery:  [phone]  Call [phone] if you have any questions prior to your surgery date Monday-Friday 8am-4pm   Remember:  Do not eat after midnight the night before your surgery  You may drink clear liquids until 9:30 A.M. the morning of your surgery.   Clear liquids allowed are: Water, Non-Citrus Juices (without pulp), Carbonated Beverages, Clear Tea, Black Coffee Only, and Gatorade  Enhanced Recovery after Surgery for Orthopedics Enhanced Recovery after Surgery is a protocol used to improve the stress on your body and your recovery after surgery.  Patient Instructions  . The night before surgery:  o No food after midnight. ONLY clear liquids after midnight  .  Marland Kitchen The day of surgery (if you do NOT have diabetes):  o Drink ONE (1) Pre-Surgery Clear Ensure as directed.   o This drink was given to you during your hospital  pre-op appointment visit. o The pre-op nurse will instruct you on the time to drink the  Pre-Surgery Ensure depending on your surgery time. o Finish the drink by 9:30 A.M. the morning of surgery.  o Nothing else to drink after completing the  Pre-Surgery Clear Ensure.        If you have questions, please contact your surgeon's office.    Take these medicines the  morning of surgery with A SIP OF WATER  atorvastatin (LIPITOR) Azelastine HCl/nasal spray  estradiol (ESTRACE)  fluticasone (FLONASE)/ nasal spray fluticasone (FLOVENT HFA)/ inhaler HYDROcodone-acetaminophen (NORCO) loratadine (CLARITIN) PARoxetine (PAXIL)  If needed - acetaminophen (TYLENOL), albuterol (PROVENTIL HFA;VENTOLIN HFA) 108 (90 Base)/inhaler,    Follow your surgeon's instructions on when to stop Aspirin.  If no instructions were given by your surgeon then you will need to call the office to get those instructions.    As of today, STOP celecoxib (CELEBREX), diclofenac Sodium (VOLTAREN), Aleve, Naproxen, Ibuprofen, Motrin, Advil, Goody's, BC's, all herbal medications, fish oil, and all vitamins.   The Morning of Surgery  Do not wear jewelry, make-up or nail polish.  Do not wear lotions, powders,  perfumes or deodorant  Do not shave 48 hours prior to surgery.    Do not bring valuables to the hospital.  [facility] is not responsible for any belongings or valuables.  If you are a smoker, DO NOT Smoke 24 hours prior to surgery  If you wear a CPAP at night please bring your mask the morning of surgery   Remember that you must have someone to transport you home after your surgery, and remain with you for 24 hours if you are discharged the same day.  Please bring cases for contacts, glasses, hearing aids, dentures or bridgework because it cannot be worn into surgery.   Leave your suitcase in the car.  After surgery it may be brought to your room.  For patients admitted to the hospital, discharge time  will be determined by your treatment team.  Patients discharged the day of surgery will not be allowed to drive home.   Special instructions:   [facility]- Preparing For Surgery  Before surgery, you can play an important role. Because skin is not sterile, your skin needs to be as free of germs as possible. You can reduce the number of germs on your skin by washing with CHG  (chlorahexidine gluconate) Soap before surgery.  CHG is an antiseptic cleaner which kills germs and bonds with the skin to continue killing germs even after washing.    Oral Hygiene is also important to reduce your risk of infection.  Remember - BRUSH YOUR TEETH THE MORNING OF SURGERY WITH YOUR REGULAR TOOTHPASTE  Please do not use if you have an allergy to CHG or antibacterial soaps. If your skin becomes reddened/irritated stop using the CHG.  Do not shave (including legs and underarms) for at least 48 hours prior to first CHG shower. It is OK to shave your face.  Please follow these instructions carefully.   1. Shower the NIGHT BEFORE SURGERY and the MORNING OF SURGERY with CHG Soap.   2. If you chose to wash your hair, wash your hair first as usual with your normal shampoo.  3. After you shampoo, rinse your hair and body thoroughly to remove the shampoo.  4. Use CHG as you would any other liquid soap. You can apply CHG directly to the skin and wash gently with a scrungie or a clean washcloth.   5. Apply the CHG Soap to your body ONLY FROM THE NECK DOWN.  Do not use on open wounds or open sores. Avoid contact with your eyes, ears, mouth and genitals (private parts). Wash Face and genitals (private parts)  with your normal soap.   6. Wash thoroughly, paying special attention to the area where your surgery will be performed.  7. Thoroughly rinse your body with warm water from the neck down.  8. DO NOT shower/wash with your normal soap after using and rinsing off the CHG Soap.  9. Pat yourself dry with a CLEAN TOWEL.  10. Wear CLEAN PAJAMAS to bed the night before surgery, wear comfortable clothes the morning of surgery  11. Place CLEAN SHEETS on your bed the night of your first shower and DO NOT SLEEP WITH PETS.  Day of Surgery: Please shower the morning of surgery with the CHG soap Do not apply any deodorants/lotions. Please wear clean clothes to the hospital/surgery center.     Remember to brush your teeth WITH YOUR REGULAR TOOTHPASTE.  Please read over the following fact sheets that you were given.

## 2019-04-07 NOTE — Progress Notes (Addendum)
PCP - Dr. Rita Ohara Cardiologist - denies  PPM/ICD - denies  Chest x-ray - N/A EKG - 03/30/2019 Stress Test - denies ECHO - denies Cardiac Cath - denies  Sleep Study - yes, 2018 CPAP - denies, per patient has mild sleep apnea and only uses a mouth guard   Blood Thinner Instructions: N/A Aspirin Instructions: per patient last dose 04/03/2019  ERAS Protcol - Yes PRE-SURGERY Ensure or G2- Ensure given  COVID TEST- Scheduled today 2//19/2021. Patient verbalized understanding of self-quarantine instructions, appointment time and place.   Anesthesia review: YES, surgical clearance  Patient denies shortness of breath, fever, cough and chest pain at PAT appointment  All instructions explained to the patient, with a verbal understanding of the material. Patient agrees to go over the instructions while at home for a better understanding. Patient also instructed to self quarantine after being tested for COVID-19. The opportunity to ask questions was provided.

## 2019-04-09 ENCOUNTER — Encounter: Payer: Self-pay | Admitting: Family Medicine

## 2019-04-10 ENCOUNTER — Encounter: Payer: Self-pay | Admitting: Family Medicine

## 2019-04-10 DIAGNOSIS — I700 Atherosclerosis of aorta: Secondary | ICD-10-CM | POA: Insufficient documentation

## 2019-04-10 NOTE — Progress Notes (Signed)
Anesthesia Chart Review:  Case: [id number] Date/Time: 04/11/19 1215   Procedure: REVERSE SHOULDER ARTHROPLASTY (Right Shoulder)   Anesthesia type: Choice   Pre-op diagnosis: Osteoarthritis of joint of right shoulder region   Location: [facility] OR ROOM 05 / [facility]   Surgeons: Nicholes Stairs, MD      DISCUSSION: Patient is a 74 year old female scheduled for the above procedure.  History includes never smoker, post-operative N/V, chronic pain, asthma, OSA ("mild", uses mouth guard), GERD, IBS, HLD, bradycardia, mild mitral regurgitation (2016), nasal fracture (s/p closed reduction 08/29/18), skin cancer (BCC, SCC), brow lift 10/10/18. In ~ 01/2016-02/2016 for three episodes of right eye visual disturbances. She had a MRI, carotid US, echo, and event monitor showed an unremarkable vascular work-up. She was evaluated by neur ophthalmologist  Rae Roam, MD and neurologist Aviva Kluver, [facility] for possible amaurosis fugax versus retinal migraine. ASA and statin recommended.   She was evaluated by her PCP Rita Ohara, MD on 03/30/19 for preoperative in-person evaluation with EKG. EKG felt stable. She also reviewed recent shoulder CT results. BP was elevated at 160/70 without diagnosis of HTN, but in setting of pain and taking regular decongestants. She as asked to monitor BP and cut back on Sudafed (to try coricidin instead). Dr. Tomi Bamberger felt patient was "Okay for surgery if BP's 140 or less." BP 142/70 at PAT.   She denied shortness of breath, cough, fever, chest pain at PAT RN visit.  04/07/19 presurgical COVID-19 test negative. She will get vitals and anesthesia team evaluation on the day of surgery.      VS: BP (!) 142/70   Pulse 88   Temp 37.1 C (Oral)   Resp 18   Ht 5\' 5"  (1.651 m)   Wt 77.4 kg   SpO2 97%   BMI 28.39 kg/m    PROVIDERS: Rita Ohara, MD is PCP  - She is not followed routinely by cardiology, but saw [name], MD in 2018 for chest pain and SB with first  degree AVB and incomplete RBBB. Myoview was normal and patient without symptomatic bradycardia. He advised yearly EKGs and discussed that sometime in the future she may require PPM should she become symptomatic.   LABS: Labs reviewed: Acceptable for surgery. (all labs ordered are listed, but only abnormal results are displayed)  Labs Reviewed  CBC - Abnormal; Notable for the following components:      Result Value   MCV 104.6 (*)    All other components within normal limits  BASIC METABOLIC PANEL - Abnormal; Notable for the following components:   Glucose, Bld 141 (*)    All other components within normal limits  SURGICAL PCR SCREEN    Normal spirometry 06/10/17.   IMAGES: CT right shoulder 03/31/19:  IMPRESSION: 1. Moderate osteoarthritis of the right glenohumeral joint. Mild degenerative AC joint spurring. 2. Mild atrophy of the supraspinatus and infraspinatus muscles. Rotator cuff tendons better assessed at MRI. 3. Faint accentuated reticulation and ground-glass densities in the lungs, technically nonspecific but potentially a manifestation of hypersensitivity pneumonitis, fibrosis, or atypical pneumonia. - Aortic Atherosclerosis (ICD10-I70.0).   EKG: 03/30/19 (Dr. Tomi Bamberger): Sinus bradycardia with first-degree AV block.  Ventricular rate 55 bpm.  Anterolateral infarct, age undetermined. ST/T wave abnormality.  - EKG appears stable when compared to 12/09/17 tracing. T wave inversion in V1-3 and III, aVF were present then as well.    CV: Nuclear stress test 07/06/16:  Nuclear stress EF: 69%.  The study is normal.  This  is a low risk study.  Blood pressure demonstrated a normal response to exercise.  There was no ST segment deviation noted during stress.  No T wave inversion was noted during stress. Low risk stress nuclear study with normal perfusion and normal left ventricular regional and global systolic function.  Cardiac event monitor 09/12/15-09/15/15:  Patient  had minimum heart rate of 41 bpm, maximum heart rate of 158 bpm, and average heart rate of 59 bpm.  Predominant underlying rhythm was sinus rhythm.  First-degree AV block was present.  6 supraventricular tachycardia runs occurred.  The run with the fastest interval lasting 5 beats with a max rate of 158 bpm, the longest lasting 16.2 seconds with an average rate of 96 bpm.  Isolated SVE's were rare (< 1.0%), SVE couplets were rare (< 1.0%), and SVE triplets were rare (< 1.0%).  Isolated VE's were rare (< 1.0%), VE couplets were rare (< 1.0%), and no VE triplets were present.  Echo 01/17/15: Study Conclusions  - Left ventricle: The cavity size was normal. Systolic function was  normal. The estimated ejection fraction was in the range of 55%  to 60%. Wall motion was normal; there were no regional wall  motion abnormalities.  - Mitral valve: There was mild regurgitation.  - Pericardium, extracardiac: A trivial pericardial effusion was  identified.  Impressions:  - No cardiac source of emboli was indentified.   Carotid US 01/17/15: Summary:  Bilateral: 1-39% ICA stenosis. Vertebral artery flow is antegrade.    Past Medical History:  Diagnosis Date  . Allergy   . Amaurosis fugax    negative w/u through [facility]  . Anxiety   . Asthma   . Back pain    Dr Joline Salt 02/2010-epidural injection x 2 at L4-5 with good effect  . 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 BCC (basal cell carcinoma of skin) 05/09/2014   mid lower back  . BCC (basal cell carcinoma of skin) 05/03/2017   right low back  . BCC (basal cell carcinoma of skin) 05/03/2017   left upper back  . BCC (basal cell carcinoma of skin) 07/05/2018   left mid back  . BCC (basal cell carcinoma of skin) 05/20/1992   upper back  . BCC (basal cell carcinoma of skin) 07/29/1993   left sholder medial  . BCC (basal cell carcinoma of skin) 07/29/1993   left sholder lateral  . BCC (basal cell carcinoma of skin)  07/29/1993   right thigh  . BCC (basal cell carcinoma of skin) 07/29/1993   right sholder  . BCC (basal cell carcinoma of skin) 12/22/1994   right mid forearm  . BCC (basal cell carcinoma of skin) 12/22/1994   right upper forearm  . BCC (basal cell carcinoma of skin) 12/22/1994   lower right upper arm  . BCC (basal cell carcinoma of skin) 12/22/1994   right upper arm sholder  . BCC (basal cell carcinoma of skin) 08/11/1995   left leg below knee  . BCC (basal cell carcinoma of skin) 04/11/2002   mid back  . BCC (basal cell carcinoma of skin) 12/10/2002   right center upper back  . BCC (basal cell carcinoma of skin) 05/26/2005   right post sholder  . BCC (basal cell carcinoma) 05/09/2014   left inner shin  . BCC (basal cell carcinoma) 06/12/2014   left forearm  . Bowen's disease 10/07/1994   right post knee, right inner forearm/wrist  . Bowen's disease 08/11/1995   left sholder  .  Cataract    left  . Chronic pain   . Depression   . Diverticulosis of colon 1998   mild  . DJD (degenerative joint disease)   . Duodenal ulcer    h/o  . GERD (gastroesophageal reflux disease)   . History of SCC (squamous cell carcinoma) of skin    Dr. Denna Haggard  . History of sinus bradycardia   . HLD (hyperlipidemia)    hypertriglyceridemia  . Hypertensive retinopathy of both eyes 11/18/2017  . IBS (irritable bowel syndrome)   . Internal hemorrhoids 1998  . Mitral regurgitation    mild  . Ocular migraine 10/31/14   Dr.Hecker  . Osteoarthritis    feet,shoulder,neck,back,hips and hands.  . Pneumonia   . PONV (postoperative nausea and vomiting)   . Rotator cuff tear, right 02/2019   infraspinatus and supraspinatus, and dislocation of long head of bicep tendon (Dr. Alvan Dame)  . SCC (squamous cell carcinoma) 11/26/2014   left hand, right hand, right deltoid  . SCC (squamous cell carcinoma) 05/03/2017   left cheek  . Sleep apnea    uses a mouth guard nightly  . Squamous cell carcinoma in situ  (SCCIS) 07/05/2018   left hand  . Vitamin D deficiency    mild    Past Surgical History:  Procedure Laterality Date  . BLEPHAROPLASTY Bilateral 01/2018  . BROW LIFT Bilateral 10/10/2018   Procedure: BROW LIFT;  Surgeon: Wallace Going, DO;  Location: [place];  Service: Plastics;  Laterality: Bilateral;  . BUNIONECTOMY     R 12/08, L 2004 (Dr. Janus Molder)  . CARPAL TUNNEL RELEASE  1989   bilateral  . CATARACT EXTRACTION Bilateral Left in 08/2012, Right 04/2016   Dr.Hecker  . CESAREAN SECTION     x2  . CHOLECYSTECTOMY  1992  . CLOSED REDUCTION NASAL FRACTURE N/A 08/29/2018   Procedure: CLOSED REDUCTION NASAL FRACTURE;  Surgeon: Wallace Going, DO;  Location: [place];  Service: Plastics;  Laterality: N/A;  1 hour, please  . COLONOSCOPY  2006  . COLONOSCOPY  01/2012   due again 01/2022; mild diverticulosis  . epidural steroid injection, back  02/2010  . HIP SURGERY     right bursectomy x 3  . HIP SURGERY Right 2000   torn cartilage, repaired  . INGUINAL HERNIA REPAIR  9/09   bilat  . NECK SURGERY  1989   c6-7 cervical laminectomy and diskecotmy  . NECK SURGERY  1989   per patient posterior area of neck  . SHOULDER SURGERY  2/05   left rotator cuff repair  . SHOULDER SURGERY Left 03/14/2018   rotator cuff repair; Dr. Tonita Cong  . TONSILLECTOMY  age 51  . TOTAL ABDOMINAL HYSTERECTOMY W/ BILATERAL SALPINGOOPHORECTOMY  1997   fibroids  . TROCHANTERIC BURSA EXCISION Right 1978  . TUBAL LIGATION  1986  . WISDOM TOOTH EXTRACTION      MEDICATIONS: . acetaminophen (TYLENOL) 500 MG tablet  . albuterol (PROVENTIL HFA;VENTOLIN HFA) 108 (90 Base) MCG/ACT inhaler  . aspirin EC 81 MG tablet  . atorvastatin (LIPITOR) 40 MG tablet  . Azelastine HCl 137 MCG/SPRAY SOLN  . Calcium Carbonate-Vitamin D (CALCIUM 600+D) 600-200 MG-UNIT TABS  . celecoxib (CELEBREX) 200 MG capsule  . diclofenac Sodium (VOLTAREN) 1 % GEL  . esomeprazole (NEXIUM) 40 MG  capsule  . estradiol (ESTRACE) 0.5 MG tablet  . fentaNYL (DURAGESIC - DOSED MCG/HR) 25 MCG/HR  . Fluorouracil (TOLAK) 4 % CREA  . fluticasone (FLONASE) 50 MCG/ACT nasal  spray  . fluticasone (FLOVENT HFA) 220 MCG/ACT inhaler  . gabapentin (NEURONTIN) 800 MG tablet  . guaiFENesin (MUCINEX) 600 MG 12 hr tablet  . HYDROcodone-acetaminophen (NORCO) 7.5-325 MG tablet  . loperamide (IMODIUM A-D) 2 MG tablet  . loratadine (CLARITIN) 10 MG tablet  . methocarbamol (ROBAXIN) 500 MG tablet  . montelukast (SINGULAIR) 10 MG tablet  . Multiple Vitamins-Minerals (MULTIVITAMIN WITH MINERALS) tablet  . niacinamide 500 MG tablet  . omega-3 acid ethyl esters (LOVAZA) 1 g capsule  . PARoxetine (PAXIL) 30 MG tablet   No current facility-administered medications for this encounter.    Myra Gianotti, PA-C Surgical Short Stay/Anesthesiology [facility] Phone [phone] [facility] Phone [phone] 04/10/2019 1:35 PM

## 2019-04-10 NOTE — Anesthesia Preprocedure Evaluation (Addendum)
Anesthesia Evaluation  Patient identified by MRN, date of birth, ID band Patient awake    Reviewed: Allergy & Precautions, H&P , NPO status , Patient's Chart, lab work & pertinent test results  History of Anesthesia Complications (+) PONV  Airway Mallampati: III  TM Distance: >3 FB Neck ROM: Full    Dental no notable dental hx. (+) Teeth Intact, Dental Advisory Given   Pulmonary asthma , sleep apnea ,    Pulmonary exam normal breath sounds clear to auscultation       Cardiovascular negative cardio ROS   Rhythm:Regular Rate:Normal     Neuro/Psych  Headaches, Anxiety Depression    GI/Hepatic Neg liver ROS, PUD, GERD  Medicated and Controlled,  Endo/Other  negative endocrine ROS  Renal/GU negative Renal ROS  negative genitourinary   Musculoskeletal  (+) Arthritis , Osteoarthritis,    Abdominal   Peds  Hematology negative hematology ROS (+)   Anesthesia Other Findings   Reproductive/Obstetrics negative OB ROS                            Anesthesia Physical Anesthesia Plan  ASA: II  Anesthesia Plan: General   Post-op Pain Management:  Regional for Post-op pain   Induction: Intravenous  PONV Risk Score and Plan: 4 or greater and Ondansetron, Dexamethasone and Midazolam  Airway Management Planned: Oral ETT  Additional Equipment:   Intra-op Plan:   Post-operative Plan: Extubation in OR  Informed Consent: I have reviewed the patients History and Physical, chart, labs and discussed the procedure including the risks, benefits and alternatives for the proposed anesthesia with the patient or authorized representative who has indicated his/her understanding and acceptance.     Dental advisory given  Plan Discussed with: CRNA  Anesthesia Plan Comments: (PAT note written 04/10/2019 by Myra Gianotti, PA-C. )       Anesthesia Quick Evaluation

## 2019-04-11 ENCOUNTER — Other Ambulatory Visit: Payer: Self-pay

## 2019-04-11 ENCOUNTER — Inpatient Hospital Stay (HOSPITAL_COMMUNITY): Payer: Medicare PPO | Admitting: Vascular Surgery

## 2019-04-11 ENCOUNTER — Encounter (HOSPITAL_COMMUNITY)
Admission: RE | Disposition: A | Discharge status: Home / Self Care | Payer: Self-pay | Source: Home / Self Care | Attending: Orthopedic Surgery

## 2019-04-11 ENCOUNTER — Inpatient Hospital Stay (HOSPITAL_COMMUNITY): Payer: Medicare PPO

## 2019-04-11 ENCOUNTER — Encounter (HOSPITAL_COMMUNITY): Payer: Self-pay | Admitting: Orthopedic Surgery

## 2019-04-11 ENCOUNTER — Inpatient Hospital Stay (HOSPITAL_COMMUNITY)
Admission: RE | Admit: 2019-04-11 | Discharge: 2019-04-12 | DRG: 483 | Disposition: A | Discharge status: Home / Self Care | Payer: Medicare PPO | Attending: Orthopedic Surgery | Admitting: Orthopedic Surgery

## 2019-04-11 ENCOUNTER — Inpatient Hospital Stay (HOSPITAL_COMMUNITY): Payer: Medicare PPO | Admitting: Certified Registered Nurse Anesthetist

## 2019-04-11 DIAGNOSIS — Z85828 Personal history of other malignant neoplasm of skin: Secondary | ICD-10-CM

## 2019-04-11 DIAGNOSIS — Z825 Family history of asthma and other chronic lower respiratory diseases: Secondary | ICD-10-CM

## 2019-04-11 DIAGNOSIS — Z79899 Other long term (current) drug therapy: Secondary | ICD-10-CM

## 2019-04-11 DIAGNOSIS — Z885 Allergy status to narcotic agent status: Secondary | ICD-10-CM

## 2019-04-11 DIAGNOSIS — Z808 Family history of malignant neoplasm of other organs or systems: Secondary | ICD-10-CM

## 2019-04-11 DIAGNOSIS — M19011 Primary osteoarthritis, right shoulder: Principal | ICD-10-CM | POA: Diagnosis present

## 2019-04-11 DIAGNOSIS — M659 Synovitis and tenosynovitis, unspecified: Secondary | ICD-10-CM | POA: Diagnosis present

## 2019-04-11 DIAGNOSIS — M75101 Unspecified rotator cuff tear or rupture of right shoulder, not specified as traumatic: Secondary | ICD-10-CM | POA: Diagnosis present

## 2019-04-11 DIAGNOSIS — Z7982 Long term (current) use of aspirin: Secondary | ICD-10-CM | POA: Diagnosis not present

## 2019-04-11 DIAGNOSIS — L309 Dermatitis, unspecified: Secondary | ICD-10-CM | POA: Diagnosis present

## 2019-04-11 DIAGNOSIS — L821 Other seborrheic keratosis: Secondary | ICD-10-CM | POA: Diagnosis present

## 2019-04-11 DIAGNOSIS — Z79890 Hormone replacement therapy (postmenopausal): Secondary | ICD-10-CM | POA: Diagnosis not present

## 2019-04-11 DIAGNOSIS — J45909 Unspecified asthma, uncomplicated: Secondary | ICD-10-CM | POA: Diagnosis present

## 2019-04-11 DIAGNOSIS — Z96611 Presence of right artificial shoulder joint: Secondary | ICD-10-CM

## 2019-04-11 DIAGNOSIS — Z91048 Other nonmedicinal substance allergy status: Secondary | ICD-10-CM | POA: Diagnosis not present

## 2019-04-11 DIAGNOSIS — Z9889 Other specified postprocedural states: Secondary | ICD-10-CM | POA: Insufficient documentation

## 2019-04-11 HISTORY — PX: REVERSE SHOULDER ARTHROPLASTY: SHX5054

## 2019-04-11 SURGERY — ARTHROPLASTY, SHOULDER, TOTAL, REVERSE
Anesthesia: General | Site: Shoulder | Laterality: Right

## 2019-04-11 MED ORDER — PROPOFOL 10 MG/ML IV BOLUS
INTRAVENOUS | Status: AC
  Filled 2019-04-11: qty 20

## 2019-04-11 MED ORDER — MENTHOL 3 MG MT LOZG: 1.0000 | LOZENGE | OROMUCOSAL | Status: DC | PRN

## 2019-04-11 MED ORDER — DEXAMETHASONE SODIUM PHOSPHATE 10 MG/ML IJ SOLN
INTRAMUSCULAR | Status: DC | PRN
  Administered 2019-04-11: 4 mg via INTRAVENOUS

## 2019-04-11 MED ORDER — LACTATED RINGERS IV SOLN: INTRAVENOUS | Status: DC

## 2019-04-11 MED ORDER — AZELASTINE HCL 0.1 % NA SOLN
2.0000 | Freq: Two times a day (BID) | NASAL | Status: DC
  Administered 2019-04-11 – 2019-04-12 (×2): 2 via NASAL
  Filled 2019-04-11: qty 30

## 2019-04-11 MED ORDER — METHOCARBAMOL 500 MG PO TABS: 500.0000 mg | ORAL_TABLET | Freq: Four times a day (QID) | ORAL | Status: DC | PRN

## 2019-04-11 MED ORDER — MONTELUKAST SODIUM 10 MG PO TABS
10.0000 mg | ORAL_TABLET | Freq: Every day | ORAL | Status: DC
  Filled 2019-04-11 (×2): qty 1

## 2019-04-11 MED ORDER — CEFAZOLIN SODIUM-DEXTROSE 1-4 GM/50ML-% IV SOLN
1.0000 g | Freq: Four times a day (QID) | INTRAVENOUS | Status: AC
  Administered 2019-04-11 – 2019-04-12 (×3): 1 g via INTRAVENOUS
  Filled 2019-04-11 (×3): qty 50

## 2019-04-11 MED ORDER — ROCURONIUM BROMIDE 10 MG/ML (PF) SYRINGE
PREFILLED_SYRINGE | INTRAVENOUS | Status: DC | PRN
  Administered 2019-04-11: 60 mg via INTRAVENOUS

## 2019-04-11 MED ORDER — LORATADINE 10 MG PO TABS
10.0000 mg | ORAL_TABLET | Freq: Every day | ORAL | Status: DC
  Administered 2019-04-11 – 2019-04-12 (×2): 10 mg via ORAL
  Filled 2019-04-11 (×2): qty 1

## 2019-04-11 MED ORDER — EPHEDRINE SULFATE-NACL 50-0.9 MG/10ML-% IV SOSY
PREFILLED_SYRINGE | INTRAVENOUS | Status: DC | PRN
  Administered 2019-04-11: 10 mg via INTRAVENOUS
  Administered 2019-04-11: 5 mg via INTRAVENOUS

## 2019-04-11 MED ORDER — HYDROMORPHONE HCL 1 MG/ML IJ SOLN: 0.2500 mg | INTRAMUSCULAR | Status: DC | PRN

## 2019-04-11 MED ORDER — ONDANSETRON HCL 4 MG PO TABS: 4.0000 mg | ORAL_TABLET | Freq: Four times a day (QID) | ORAL | Status: DC | PRN

## 2019-04-11 MED ORDER — GABAPENTIN 400 MG PO CAPS
800.0000 mg | ORAL_CAPSULE | Freq: Every day | ORAL | Status: DC
  Administered 2019-04-11: 20:00:00 800 mg via ORAL
  Filled 2019-04-11: qty 2

## 2019-04-11 MED ORDER — FENTANYL CITRATE (PF) 250 MCG/5ML IJ SOLN
INTRAMUSCULAR | Status: AC
  Filled 2019-04-11: qty 5

## 2019-04-11 MED ORDER — ALBUTEROL SULFATE (2.5 MG/3ML) 0.083% IN NEBU: 2.5000 mg | INHALATION_SOLUTION | Freq: Four times a day (QID) | RESPIRATORY_TRACT | Status: DC | PRN

## 2019-04-11 MED ORDER — PANTOPRAZOLE SODIUM 40 MG PO TBEC
40.0000 mg | DELAYED_RELEASE_TABLET | Freq: Every day | ORAL | Status: DC
  Administered 2019-04-11 – 2019-04-12 (×2): 40 mg via ORAL
  Filled 2019-04-11 (×2): qty 1

## 2019-04-11 MED ORDER — ACETAMINOPHEN 500 MG PO TABS
1000.0000 mg | ORAL_TABLET | Freq: Four times a day (QID) | ORAL | Status: AC
  Administered 2019-04-11 – 2019-04-12 (×4): 1000 mg via ORAL
  Filled 2019-04-11 (×4): qty 2

## 2019-04-11 MED ORDER — TRANEXAMIC ACID-NACL 1000-0.7 MG/100ML-% IV SOLN
INTRAVENOUS | Status: AC
  Filled 2019-04-11: qty 100

## 2019-04-11 MED ORDER — HYDROMORPHONE HCL 1 MG/ML IJ SOLN: 0.5000 mg | INTRAMUSCULAR | Status: DC | PRN

## 2019-04-11 MED ORDER — NIACINAMIDE 500 MG PO TABS: 500.0000 mg | ORAL_TABLET | Freq: Two times a day (BID) | ORAL | Status: DC

## 2019-04-11 MED ORDER — CEFAZOLIN SODIUM-DEXTROSE 2-4 GM/100ML-% IV SOLN
2.0000 g | INTRAVENOUS | Status: AC
  Administered 2019-04-11: 2 g via INTRAVENOUS

## 2019-04-11 MED ORDER — OXYCODONE HCL 5 MG PO TABS: 5.0000 mg | ORAL_TABLET | ORAL | Status: DC | PRN

## 2019-04-11 MED ORDER — SUGAMMADEX SODIUM 200 MG/2ML IV SOLN
INTRAVENOUS | Status: DC | PRN
  Administered 2019-04-11: 200 mg via INTRAVENOUS

## 2019-04-11 MED ORDER — ONDANSETRON HCL 4 MG/2ML IJ SOLN
INTRAMUSCULAR | Status: DC | PRN
  Administered 2019-04-11: 4 mg via INTRAVENOUS

## 2019-04-11 MED ORDER — FLUTICASONE PROPIONATE 50 MCG/ACT NA SUSP
1.0000 | Freq: Two times a day (BID) | NASAL | Status: DC
  Administered 2019-04-11 – 2019-04-12 (×2): 1 via NASAL
  Filled 2019-04-11: qty 16

## 2019-04-11 MED ORDER — LOPERAMIDE HCL 2 MG PO CAPS
2.0000 mg | ORAL_CAPSULE | Freq: Four times a day (QID) | ORAL | Status: DC | PRN
  Filled 2019-04-11: qty 1

## 2019-04-11 MED ORDER — ACETAMINOPHEN 500 MG PO TABS: 1000.0000 mg | ORAL_TABLET | Freq: Once | ORAL | Status: DC

## 2019-04-11 MED ORDER — BUPIVACAINE-EPINEPHRINE (PF) 0.5% -1:200000 IJ SOLN
INTRAMUSCULAR | Status: DC | PRN
  Administered 2019-04-11: 15 mL via PERINEURAL

## 2019-04-11 MED ORDER — FENTANYL CITRATE (PF) 100 MCG/2ML IJ SOLN
INTRAMUSCULAR | Status: AC
  Administered 2019-04-11: 50 ug via INTRAVENOUS
  Filled 2019-04-11: qty 2

## 2019-04-11 MED ORDER — FENTANYL CITRATE (PF) 250 MCG/5ML IJ SOLN
INTRAMUSCULAR | Status: DC | PRN
  Administered 2019-04-11: 50 ug via INTRAVENOUS
  Administered 2019-04-11 (×2): 25 ug via INTRAVENOUS

## 2019-04-11 MED ORDER — VANCOMYCIN HCL 1 G IV SOLR
INTRAVENOUS | Status: DC | PRN
  Administered 2019-04-11: 1000 mg via TOPICAL

## 2019-04-11 MED ORDER — ATORVASTATIN CALCIUM 40 MG PO TABS
40.0000 mg | ORAL_TABLET | Freq: Every day | ORAL | Status: DC
  Administered 2019-04-11 – 2019-04-12 (×2): 40 mg via ORAL
  Filled 2019-04-11 (×2): qty 1

## 2019-04-11 MED ORDER — ONDANSETRON HCL 4 MG/2ML IJ SOLN
INTRAMUSCULAR | Status: AC
  Filled 2019-04-11: qty 2

## 2019-04-11 MED ORDER — DEXAMETHASONE SODIUM PHOSPHATE 10 MG/ML IJ SOLN
INTRAMUSCULAR | Status: AC
  Filled 2019-04-11: qty 1

## 2019-04-11 MED ORDER — PAROXETINE HCL 30 MG PO TABS
60.0000 mg | ORAL_TABLET | Freq: Every day | ORAL | Status: DC
  Administered 2019-04-11 – 2019-04-12 (×2): 60 mg via ORAL
  Filled 2019-04-11 (×2): qty 2

## 2019-04-11 MED ORDER — PHENYLEPHRINE HCL-NACL 10-0.9 MG/250ML-% IV SOLN
INTRAVENOUS | Status: DC | PRN
  Administered 2019-04-11: 60 ug/min via INTRAVENOUS

## 2019-04-11 MED ORDER — LIDOCAINE 2% (20 MG/ML) 5 ML SYRINGE
INTRAMUSCULAR | Status: AC
  Filled 2019-04-11: qty 5

## 2019-04-11 MED ORDER — DOCUSATE SODIUM 100 MG PO CAPS
100.0000 mg | ORAL_CAPSULE | Freq: Two times a day (BID) | ORAL | Status: DC
  Administered 2019-04-11 – 2019-04-12 (×2): 100 mg via ORAL
  Filled 2019-04-11 (×2): qty 1

## 2019-04-11 MED ORDER — MIDAZOLAM HCL 2 MG/2ML IJ SOLN: 1.0000 mg | Freq: Once | INTRAMUSCULAR | Status: AC

## 2019-04-11 MED ORDER — GUAIFENESIN ER 600 MG PO TB12
600.0000 mg | ORAL_TABLET | Freq: Two times a day (BID) | ORAL | Status: DC | PRN
  Filled 2019-04-11: qty 1

## 2019-04-11 MED ORDER — CEFAZOLIN SODIUM-DEXTROSE 2-4 GM/100ML-% IV SOLN
INTRAVENOUS | Status: AC
  Filled 2019-04-11: qty 100

## 2019-04-11 MED ORDER — ACETAMINOPHEN 325 MG PO TABS: 325.0000 mg | ORAL_TABLET | Freq: Four times a day (QID) | ORAL | Status: DC | PRN

## 2019-04-11 MED ORDER — BUDESONIDE 0.5 MG/2ML IN SUSP
0.5000 mg | Freq: Two times a day (BID) | RESPIRATORY_TRACT | Status: DC
  Administered 2019-04-11 – 2019-04-12 (×2): 0.5 mg via RESPIRATORY_TRACT
  Filled 2019-04-11 (×3): qty 2

## 2019-04-11 MED ORDER — METOCLOPRAMIDE HCL 5 MG/ML IJ SOLN: 5.0000 mg | Freq: Three times a day (TID) | INTRAMUSCULAR | Status: DC | PRN

## 2019-04-11 MED ORDER — TRANEXAMIC ACID-NACL 1000-0.7 MG/100ML-% IV SOLN
1000.0000 mg | INTRAVENOUS | Status: AC
  Administered 2019-04-11: 1000 mg via INTRAVENOUS

## 2019-04-11 MED ORDER — CELECOXIB 200 MG PO CAPS
200.0000 mg | ORAL_CAPSULE | Freq: Two times a day (BID) | ORAL | Status: DC
  Administered 2019-04-11 – 2019-04-12 (×2): 200 mg via ORAL
  Filled 2019-04-11 (×2): qty 1

## 2019-04-11 MED ORDER — PHENOL 1.4 % MT LIQD: 1.0000 | OROMUCOSAL | Status: DC | PRN

## 2019-04-11 MED ORDER — PROPOFOL 10 MG/ML IV BOLUS
INTRAVENOUS | Status: DC | PRN
  Administered 2019-04-11: 110 mg via INTRAVENOUS

## 2019-04-11 MED ORDER — CHLORHEXIDINE GLUCONATE 4 % EX LIQD: 60.0000 mL | Freq: Once | CUTANEOUS | Status: DC

## 2019-04-11 MED ORDER — ROCURONIUM BROMIDE 10 MG/ML (PF) SYRINGE
PREFILLED_SYRINGE | INTRAVENOUS | Status: AC
  Filled 2019-04-11: qty 10

## 2019-04-11 MED ORDER — ASPIRIN EC 81 MG PO TBEC
81.0000 mg | DELAYED_RELEASE_TABLET | Freq: Every day | ORAL | Status: DC
  Administered 2019-04-12: 81 mg via ORAL
  Filled 2019-04-11: qty 1

## 2019-04-11 MED ORDER — OXYCODONE HCL 5 MG PO TABS: 10.0000 mg | ORAL_TABLET | ORAL | Status: DC | PRN

## 2019-04-11 MED ORDER — FENTANYL CITRATE (PF) 100 MCG/2ML IJ SOLN: 50.0000 ug | Freq: Once | INTRAMUSCULAR | Status: AC

## 2019-04-11 MED ORDER — METOCLOPRAMIDE HCL 5 MG PO TABS: 5.0000 mg | ORAL_TABLET | Freq: Three times a day (TID) | ORAL | Status: DC | PRN

## 2019-04-11 MED ORDER — ONDANSETRON HCL 4 MG/2ML IJ SOLN: 4.0000 mg | Freq: Four times a day (QID) | INTRAMUSCULAR | Status: DC | PRN

## 2019-04-11 MED ORDER — MIDAZOLAM HCL 2 MG/2ML IJ SOLN
INTRAMUSCULAR | Status: AC
  Administered 2019-04-11: 1 mg via INTRAVENOUS
  Filled 2019-04-11: qty 2

## 2019-04-11 MED ORDER — VANCOMYCIN HCL 1000 MG IV SOLR
INTRAVENOUS | Status: AC
  Filled 2019-04-11: qty 1000

## 2019-04-11 MED ORDER — ESTRADIOL 0.5 MG PO TABS: 0.2500 mg | ORAL_TABLET | Freq: Every day | ORAL | Status: DC

## 2019-04-11 MED ORDER — METHOCARBAMOL 1000 MG/10ML IJ SOLN
500.0000 mg | Freq: Four times a day (QID) | INTRAVENOUS | Status: DC | PRN
  Filled 2019-04-11: qty 5

## 2019-04-11 MED ORDER — BUPIVACAINE LIPOSOME 1.3 % IJ SUSP
INTRAMUSCULAR | Status: DC | PRN
  Administered 2019-04-11: 10 mL via PERINEURAL

## 2019-04-11 MED ORDER — GABAPENTIN 800 MG PO TABS
800.0000 mg | ORAL_TABLET | Freq: Every day | ORAL | Status: DC
  Filled 2019-04-11: qty 1

## 2019-04-11 MED ORDER — 0.9 % SODIUM CHLORIDE (POUR BTL) OPTIME
TOPICAL | Status: DC | PRN
  Administered 2019-04-11: 1000 mL

## 2019-04-11 SURGICAL SUPPLY — 64 items
BIT DRILL 5/64X5 DISP (BIT) ×2 IMPLANT
BLADE SAG 18X100X1.27 (BLADE) ×2 IMPLANT
COVER SURGICAL LIGHT HANDLE (MISCELLANEOUS) ×2 IMPLANT
COVER WAND RF STERILE (DRAPES) ×1 IMPLANT
CUP SUT UNIV REVERS 36 NEUTRAL (Cup) ×1 IMPLANT
DRAPE IMP U-DRAPE 54X76 (DRAPES) ×2 IMPLANT
DRAPE INCISE IOBAN 66X45 STRL (DRAPES) ×2 IMPLANT
DRAPE ORTHO SPLIT 77X108 STRL (DRAPES) ×2
DRAPE SURG 17X23 STRL (DRAPES) ×2 IMPLANT
DRAPE SURG ORHT 6 SPLT 77X108 (DRAPES) ×2 IMPLANT
DRAPE U-SHAPE 47X51 STRL (DRAPES) ×2 IMPLANT
DRSG AQUACEL AG ADV 3.5X10 (GAUZE/BANDAGES/DRESSINGS) ×2 IMPLANT
DURAPREP 26ML APPLICATOR (WOUND CARE) ×2 IMPLANT
ELECT BLADE 4.0 EZ CLEAN MEGAD (MISCELLANEOUS) ×2
ELECT REM PT RETURN 9FT ADLT (ELECTROSURGICAL) ×2
ELECTRODE BLDE 4.0 EZ CLN MEGD (MISCELLANEOUS) ×1 IMPLANT
ELECTRODE REM PT RTRN 9FT ADLT (ELECTROSURGICAL) ×1 IMPLANT
GLENOID UNI REV MOD 24 +2 LAT (Joint) ×1 IMPLANT
GLENOSPHERE 36 +4 LAT/24 (Joint) ×1 IMPLANT
GLOVE BIO SURGEON STRL SZ7.5 (GLOVE) ×3 IMPLANT
GLOVE BIOGEL PI IND STRL 8 (GLOVE) ×1 IMPLANT
GLOVE BIOGEL PI INDICATOR 8 (GLOVE) ×1
GOWN STRL REUS W/ TWL LRG LVL3 (GOWN DISPOSABLE) ×1 IMPLANT
GOWN STRL REUS W/ TWL XL LVL3 (GOWN DISPOSABLE) ×1 IMPLANT
GOWN STRL REUS W/TWL LRG LVL3 (GOWN DISPOSABLE) ×2
GOWN STRL REUS W/TWL XL LVL3 (GOWN DISPOSABLE) ×2
INSERT HUMERAL 36 +6 (Shoulder) ×1 IMPLANT
KIT BASIN OR (CUSTOM PROCEDURE TRAY) ×2 IMPLANT
KIT TURNOVER KIT B (KITS) ×2 IMPLANT
MANIFOLD NEPTUNE II (INSTRUMENTS) ×2 IMPLANT
NDL 1/2 CIR MAYO (NEEDLE) ×1 IMPLANT
NDL HYPO 25GX1X1/2 BEV (NEEDLE) ×1 IMPLANT
NEEDLE 1/2 CIR MAYO (NEEDLE) ×2 IMPLANT
NEEDLE HYPO 25GX1X1/2 BEV (NEEDLE) ×2 IMPLANT
NS IRRIG 1000ML POUR BTL (IV SOLUTION) ×2 IMPLANT
PACK SHOULDER (CUSTOM PROCEDURE TRAY) ×2 IMPLANT
PAD ARMBOARD 7.5X6 YLW CONV (MISCELLANEOUS) ×2 IMPLANT
PIN SET MODULAR GLENOID SYSTEM (PIN) ×1 IMPLANT
RESTRAINT HEAD UNIVERSAL NS (MISCELLANEOUS) ×2 IMPLANT
SCREW CENTRAL MODULAR 25 (Screw) ×1 IMPLANT
SCREW PERI LOCK 5.5X16 (Screw) ×2 IMPLANT
SCREW PERI LOCK 5.5X32 (Screw) ×1 IMPLANT
SCREW PERIPHERAL 5.5X28 LOCK (Screw) ×1 IMPLANT
SLING ARM IMMOBILIZER LRG (SOFTGOODS) ×1 IMPLANT
SLING ARM IMMOBILIZER MED (SOFTGOODS) IMPLANT
SPONGE LAP 18X18 RF (DISPOSABLE) IMPLANT
SPONGE LAP 4X18 RFD (DISPOSABLE) ×2 IMPLANT
STEM HUMERAL UNI REVERS SZ6 (Stem) ×1 IMPLANT
STRIP CLOSURE SKIN 1/2X4 (GAUZE/BANDAGES/DRESSINGS) ×2 IMPLANT
SUCTION FRAZIER HANDLE 10FR (MISCELLANEOUS) ×1
SUCTION TUBE FRAZIER 10FR DISP (MISCELLANEOUS) ×1 IMPLANT
SUT FIBERWIRE #2 38 T-5 BLUE (SUTURE) ×2
SUT MNCRL AB 4-0 PS2 18 (SUTURE) ×2 IMPLANT
SUT VIC AB 1 CT1 27 (SUTURE)
SUT VIC AB 1 CT1 27XBRD ANBCTR (SUTURE) IMPLANT
SUT VIC AB 2-0 CT1 27 (SUTURE) ×2
SUT VIC AB 2-0 CT1 TAPERPNT 27 (SUTURE) ×1 IMPLANT
SUTURE FIBERWR #2 38 T-5 BLUE (SUTURE) ×1 IMPLANT
SYR CONTROL 10ML LL (SYRINGE) ×1 IMPLANT
TOWEL GREEN STERILE (TOWEL DISPOSABLE) ×2 IMPLANT
TOWEL GREEN STERILE FF (TOWEL DISPOSABLE) ×1 IMPLANT
TOWER CARTRIDGE SMART MIX (DISPOSABLE) IMPLANT
WATER STERILE IRR 1000ML POUR (IV SOLUTION) ×1 IMPLANT
YANKAUER SUCT BULB TIP NO VENT (SUCTIONS) ×2 IMPLANT

## 2019-04-11 NOTE — Anesthesia Procedure Notes (Signed)
Procedure Name: Intubation Performed by: Pace Lamadrid H, CRNA Pre-anesthesia Checklist: Patient identified, Emergency Drugs available, Suction available and Patient being monitored Patient Re-evaluated:Patient Re-evaluated prior to induction Oxygen Delivery Method: Circle System Utilized Preoxygenation: Pre-oxygenation with 100% oxygen Induction Type: IV induction Ventilation: Mask ventilation without difficulty Laryngoscope Size: Miller and 2 Grade View: Grade II Tube type: Oral Tube size: 7.0 mm Number of attempts: 1 Airway Equipment and Method: Stylet and Oral airway Placement Confirmation: ETT inserted through vocal cords under direct vision,  positive ETCO2 and breath sounds checked- equal and bilateral Secured at: 22 cm Tube secured with: Tape Dental Injury: Teeth and Oropharynx as per pre-operative assessment        

## 2019-04-11 NOTE — Op Note (Signed)
04/11/2019  2:43 PM  PATIENT:  Beverly [name]    PRE-OPERATIVE DIAGNOSIS:  Rotator cuff arthropathy of joint of right shoulder region  POST-OPERATIVE DIAGNOSIS:   1. Rotator cuff arthropathy of joint of right shoulder region  PROCEDURE:   1.  Right  REVERSE SHOULDER ARTHROPLASTY 2.  Transfer of long head of biceps to pectoralis major tendon, right shoulder.  SURGEON:  Nicholes Stairs, MD  ASSISTANT: Katy Apo, RNFA  ANESTHESIA:   General  ESTIMATED BLOOD LOSS: 150 cc  PREOPERATIVE INDICATIONS:  Beverly [name] is a  74 y.o. female with a diagnosis of massive and retracted superior rotator cuff tear with atrophy of the musculature and concomitant arthritis of the right shoulder, who failed conservative measures and elected for surgical management.    The risks benefits and alternatives were discussed with the patient preoperatively including but not limited to the risks of infection, bleeding, nerve injury, cardiopulmonary complications, the need for revision surgery, dislocation, brachial plexus palsy, incomplete relief of pain, among others, and the patient was willing to proceed.  OPERATIVE IMPLANTS:  Arthrex size 6 reverse stem, press-fit. Arthrex size 24 mm +2 baseplate with a 25 mm x 6.5 mm compression screw centrally.  28 mm inferior screw, 32 mm superior screw, 16 mm anterior posterior all locking screws. 36 mm +4 glenosphere. Standard humeral tray with a +6 polyethylene liner  OPERATIVE FINDINGS:  Massive and retracted superior rotator cuff tear with no evidence of remnant rotator cuff tissue.  Upper border tearing of the subscapularis and maceration of the proximal biceps with flattening and linear split tears.  Teres minor intact.  Arthritic changes noted on the humeral head.  Glenoid was without tearing of labrum or signs of arthritis.  OPERATIVE PROCEDURE: The patient was brought to the operating room and placed in the supine position. General anesthesia  was administered. IV antibiotics were given. A Foley was not placed. Time out was performed. The upper extremity was prepped and draped in usual sterile fashion. The patient was in a beachchair position. Deltopectoral approach was carried out.  The cephalic vein was dissected out and retracted laterally with the deltoid throughout the case.  This allowed for direct approach to the clavipectoral fascia.  This fascia was incised along the lateral border of the conjoined tendon.  Next, we identified the long head of the biceps within the biceps groove.  There was noted to be excessive tenosynovitis with flattening of the biceps tendon as it approached the humeral articular surface.  There were split tears as well.  Given the degenerative appearance we elected to transfer the long head of the biceps to the upper border of the pectoralis major tendon.  This was accomplished with 2 figure-of-eight #2 FiberWire sutures.  We then tenotomized above the tenodesis and excised the stump.  I then performed circumferential releases of the humerus, and then dislocated the head, and then reamed with the reamer to the above named size.  I then applied the jig, and cut the humeral head in 30 of retroversion, and then turned my attention to the glenoid.  Deep retractors were placed, and I resected the labrum, and then placed a guidepin into the center position on the glenoid, with slight inferior inclination. I then reamed over the guidepin, and this created a small metaphyseal cancellus blush inferiorly, removing just the cartilage to the subchondral bone superiorly. The base plate was selected and impacted place, and then I secured it centrally with a nonlocking screw, and I  had excellent purchase both inferiorly and superiorly. I placed a short locking screws on anterior and posterior aspects.  I then turned my attention to the glenosphere, and impacted this into place.  In total we achieved 6 mm of lateralization from  the glenoid side with a +2 baseplate and a +4 glenosphere..   The glenoid sphere was completely seated, and had engagement of the [facility] taper. I then turned my attention back to the humerus.  I sequentially broached, and then trialed, and was found to restore soft tissue tension, and it had 2 finger tightness. Therefore the above named components were selected. The shoulder felt stable throughout functional motion.  Ultimately we did need a +6 polyethylene to achieve appropriate stability.  On trialing with close 6 we were satisfied with forward elevation external rotation with no gapping at the interface of the polyethylene with the glenosphere.   I then impacted the real prosthesis into place, as well as the real humeral tray, and reduced the shoulder. The shoulder had excellent motion, and was stable, and I irrigated the wounds copiously.   Given the lateralization and poor quality of the subscapularis we elected to not repair this back to the stem.  I then irrigated the shoulder copiously once more, repaired the deltopectoral interval with #2 FiberWire followed by subcutaneous Vicryl, then monocryl for the skin,  with Steri-Strips and sterile gauze for the skin. The patient was awakened and returned back in stable and satisfactory condition. There no complications and She tolerated the procedure well.  All counts were correct x2.  Disposition:  Beverly [name] will be nonweightbearing to the right upper extremity.  She will be in her sling for approximately 3 weeks postoperatively to allow soft tissue healing.  She may begin active range of motion at the shoulder immediately.  Passive range of motion should only be to tolerance with no external rotation beyond 30 degrees and no forward elevation beyond 90 degrees at this point.  She will be admitted for postoperative care and overnight observation.  Discharge home tomorrow.

## 2019-04-11 NOTE — Progress Notes (Signed)
PHARMACIST - PHYSICIAN ORDER COMMUNICATION  CONCERNING: P&T Medication Policy on Herbal Medications  DESCRIPTION:  This patient's order for:  Niacinamide 500 mg bid has been noted.  This product(s) is classified as an "herbal" or natural product. Due to a lack of definitive safety studies or FDA approval, nonstandard manufacturing practices, plus the potential risk of unknown drug-drug interactions while on inpatient medications, the Pharmacy and Therapeutics Committee does not permit the use of "herbal" or natural products of this type within [facility].   ACTION TAKEN: The pharmacy department is unable to verify this order at this time and your patient has been informed of this safety policy. Please reevaluate patient's clinical condition at discharge and address if the herbal or natural product(s) should be resumed at that time.

## 2019-04-11 NOTE — H&P (Signed)
ORTHOPAEDIC H and P  REQUESTING PHYSICIAN: Nicholes Stairs, MD  PCP:  Rita Ohara, MD  Chief Complaint: Right shoulder pain.  HPI: Beverly [name] is a 74 y.o. female who complains of right shoulder pain and pseudoparesis.  She presents today for reverse shoulder arthroplasty.  She has no new complaints at this time.  We have previously counseled her as to the need for the surgery and treatment options.  She presents for surgical management today.  Past Medical History:  Diagnosis Date  . Allergy   . Amaurosis fugax    negative w/u through [facility]  . Anxiety   . Asthma   . Back pain    Dr Joline Salt 02/2010-epidural injection x 2 at L4-5 with good effect  . 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 BCC (basal cell carcinoma of skin) 05/09/2014   mid lower back  . BCC (basal cell carcinoma of skin) 05/03/2017   right low back  . BCC (basal cell carcinoma of skin) 05/03/2017   left upper back  . BCC (basal cell carcinoma of skin) 07/05/2018   left mid back  . BCC (basal cell carcinoma of skin) 05/20/1992   upper back  . BCC (basal cell carcinoma of skin) 07/29/1993   left sholder medial  . BCC (basal cell carcinoma of skin) 07/29/1993   left sholder lateral  . BCC (basal cell carcinoma of skin) 07/29/1993   right thigh  . BCC (basal cell carcinoma of skin) 07/29/1993   right sholder  . BCC (basal cell carcinoma of skin) 12/22/1994   right mid forearm  . BCC (basal cell carcinoma of skin) 12/22/1994   right upper forearm  . BCC (basal cell carcinoma of skin) 12/22/1994   lower right upper arm  . BCC (basal cell carcinoma of skin) 12/22/1994   right upper arm sholder  . BCC (basal cell carcinoma of skin) 08/11/1995   left leg below knee  . BCC (basal cell carcinoma of skin) 04/11/2002   mid back  . BCC (basal cell carcinoma of skin) 12/10/2002   right center upper back  . BCC (basal cell carcinoma of skin) 05/26/2005   right post sholder  . BCC  (basal cell carcinoma) 05/09/2014   left inner shin  . BCC (basal cell carcinoma) 06/12/2014   left forearm  . Bowen's disease 10/07/1994   right post knee, right inner forearm/wrist  . Bowen's disease 08/11/1995   left sholder  . Cataract    left  . Chronic pain   . Depression   . Diverticulosis of colon 1998   mild  . DJD (degenerative joint disease)   . Duodenal ulcer    h/o  . GERD (gastroesophageal reflux disease)   . History of SCC (squamous cell carcinoma) of skin    Dr. Denna Haggard  . History of sinus bradycardia   . HLD (hyperlipidemia)    hypertriglyceridemia  . Hypertensive retinopathy of both eyes 11/18/2017  . IBS (irritable bowel syndrome)   . Internal hemorrhoids 1998  . Mitral regurgitation    mild  . Ocular migraine 10/31/14   Dr.Hecker  . Osteoarthritis    feet,shoulder,neck,back,hips and hands.  . Pneumonia   . PONV (postoperative nausea and vomiting)   . Rotator cuff tear, right 02/2019   infraspinatus and supraspinatus, and dislocation of long head of bicep tendon (Dr. Alvan Dame)  . SCC (squamous cell carcinoma) 11/26/2014   left hand, right hand, right deltoid  . SCC (squamous cell carcinoma)  05/03/2017   left cheek  . Sleep apnea    uses a mouth guard nightly  . Squamous cell carcinoma in situ (SCCIS) 07/05/2018   left hand  . Vitamin D deficiency    mild   Past Surgical History:  Procedure Laterality Date  . BLEPHAROPLASTY Bilateral 01/2018  . BROW LIFT Bilateral 10/10/2018   Procedure: BROW LIFT;  Surgeon: Wallace Going, DO;  Location: [facility];  Service: Plastics;  Laterality: Bilateral;  . BUNIONECTOMY     R 12/08, L 2004 (Dr. Janus Molder)  . CARPAL TUNNEL RELEASE  1989   bilateral  . CATARACT EXTRACTION Bilateral Left in 08/2012, Right 04/2016   Dr.Hecker  . CESAREAN SECTION     x2  . CHOLECYSTECTOMY  1992  . CLOSED REDUCTION NASAL FRACTURE N/A 08/29/2018   Procedure: CLOSED REDUCTION NASAL FRACTURE;  Surgeon:  Wallace Going, DO;  Location: [facility];  Service: Plastics;  Laterality: N/A;  1 hour, please  . COLONOSCOPY  2006  . COLONOSCOPY  01/2012   due again 01/2022; mild diverticulosis  . epidural steroid injection, back  02/2010  . HIP SURGERY     right bursectomy x 3  . HIP SURGERY Right 2000   torn cartilage, repaired  . INGUINAL HERNIA REPAIR  9/09   bilat  . NECK SURGERY  1989   c6-7 cervical laminectomy and diskecotmy  . NECK SURGERY  1989   per patient posterior area of neck  . SHOULDER SURGERY  2/05   left rotator cuff repair  . SHOULDER SURGERY Left 03/14/2018   rotator cuff repair; Dr. Tonita Cong  . TONSILLECTOMY  age 37  . TOTAL ABDOMINAL HYSTERECTOMY W/ BILATERAL SALPINGOOPHORECTOMY  1997   fibroids  . TROCHANTERIC BURSA EXCISION Right 1978  . TUBAL LIGATION  1986  . WISDOM TOOTH EXTRACTION     Social History   Socioeconomic History  . Marital status: Divorced    Spouse name: Not on file  . Number of children: 2  . Years of education: Not on file  . Highest education level: Not on file  Occupational History  . Occupation: STAFF Optician, dispensing: [employer]  Tobacco Use  . Smoking status: Never Smoker  . Smokeless tobacco: Never Used  Substance and Sexual Activity  . Alcohol use: Yes    Alcohol/week: 7.0 standard drinks    Types: 7 Glasses of wine per week    Comment: 1 glass of wine daily  . Drug use: No  . Sexual activity: Not Currently  Other Topics Concern  . 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   [place] for [employer] part-time   Social Determinants of Health   Financial Resource Strain:   . Difficulty of Paying Living Expenses: Not on file  Food Insecurity:   . Worried About Charity fundraiser in the Last Year: Not  on file  . Ran Out of Food in the Last Year: Not on file  Transportation Needs:   . Lack of Transportation (Medical): Not on file  . Lack of Transportation (Non-Medical): Not on file  Physical Activity:   . Days of Exercise per Week: Not on file  . Minutes of Exercise per Session: Not on file  Stress:   . Feeling of Stress :  Not on file  Social Connections:   . Frequency of Communication with Friends and Family: Not on file  . Frequency of Social Gatherings with Friends and Family: Not on file  . Attends Religious Services: Not on file  . Active Member of Clubs or Organizations: Not on file  . Attends Archivist Meetings: Not on file  . Marital Status: Not on file   Family History  Problem Relation Age of Onset  . Depression Mother   . Schizophrenia Mother   . Cancer Father        oral  . Heart disease Father        bradycardia  . Cancer Sister        skin and lung  . COPD Sister   . Hypertension Sister   . Osteoporosis Sister        compression fx's x 3 11/2018  . Hyperthyroidism Brother   . Cancer Brother        metastatic cancer to bone (?primary)  . Hyperlipidemia Daughter   . Asthma Son   . Cancer Paternal Grandmother 63       colon cancer, metastatic to liver  . Colon cancer Paternal Grandmother   . Diabetes Maternal Grandmother   . ADD / ADHD Other    Allergies  Allergen Reactions  . Adhesive [Tape] Rash  . Codeine Rash   Prior to Admission medications   Medication Sig Start Date End Date Taking? Authorizing Provider  acetaminophen (TYLENOL) 500 MG tablet Take 1,000 mg by mouth every 6 (six) hours as needed for moderate pain.    Yes [provider]  albuterol (PROVENTIL HFA;VENTOLIN HFA) 108 (90 Base) MCG/ACT inhaler Inhale 2 puffs into the lungs every 6 (six) hours as needed for wheezing or shortness of breath. 07/02/16  Yes Rita Ohara, MD  aspirin EC 81 MG tablet Take 81 mg by mouth daily.   Yes [provider]  atorvastatin  (LIPITOR) 40 MG tablet Take 1 tablet (40 mg total) by mouth daily. 01/16/19  Yes Rita Ohara, MD  Azelastine HCl 137 MCG/SPRAY SOLN Place 2 Pump into both nostrils 2 (two) times a day. 07/13/18  Yes Rita Ohara, MD  Calcium Carbonate-Vitamin D (CALCIUM 600+D) 600-200 MG-UNIT TABS Take 1 tablet by mouth 2 (two) times daily.     Yes [provider]  celecoxib (CELEBREX) 200 MG capsule Take 1 capsule (200 mg total) by mouth 2 (two) times daily. 01/16/19  Yes Rita Ohara, MD  diclofenac Sodium (VOLTAREN) 1 % GEL Apply 2 g topically 4 (four) times daily as needed (joint pain).  12/29/18  Yes [provider]  esomeprazole (NEXIUM) 40 MG capsule TAKE 1 CAPSULE BY MOUTH DAILY BEFORE SUPPER. Patient taking differently: Take 40 mg by mouth daily before supper.  04/03/19  Yes Rita Ohara, MD  estradiol (ESTRACE) 0.5 MG tablet TAKE 1 TABLET BY MOUTH ONCE DAILY Patient taking differently: 0.25 mg.  02/14/18  Yes Rita Ohara, MD  fentaNYL (DURAGESIC - DOSED MCG/HR) 25 MCG/HR Place 1 patch onto the skin every 3 (three) days.     Yes [provider]  Fluorouracil (TOLAK) 4 % CREA Apply 1 application topically every 3 (three) days.    Yes [provider]  fluticasone (FLONASE) 50 MCG/ACT nasal spray PLACE 2 SPRAYS INTO THE NOSE DAILY. Patient taking differently: Place 1 spray into both nostrils in the morning and at bedtime.  07/13/18  Yes Rita Ohara, MD  fluticasone (FLOVENT HFA) 220 MCG/ACT inhaler  INHALE 2 PUFFS INTO THE LUNGS 2 (TWO) TIMES DAILY. Patient taking differently: Inhale 2 puffs into the lungs in the morning and at bedtime.  07/13/18  Yes Rita Ohara, MD  gabapentin (NEURONTIN) 800 MG tablet Take 800 mg by mouth at bedtime.    Yes [provider]  guaiFENesin (MUCINEX) 600 MG 12 hr tablet Take 600 mg by mouth 2 (two) times daily as needed for cough or to loosen phlegm.   Yes [provider]  HYDROcodone-acetaminophen (NORCO) 7.5-325 MG tablet Take 1 tablet by  mouth in the morning and at bedtime.  10/06/18  Yes [provider]  loratadine (CLARITIN) 10 MG tablet Take 10 mg by mouth daily.   Yes [provider]  methocarbamol (ROBAXIN) 500 MG tablet Take 500 mg by mouth in the morning and at bedtime.    Yes [provider]  montelukast (SINGULAIR) 10 MG tablet Take 1 tablet (10 mg total) by mouth at bedtime. 07/13/18  Yes Rita Ohara, MD  Multiple Vitamins-Minerals (MULTIVITAMIN WITH MINERALS) tablet Take 1 tablet by mouth daily.   Yes [provider]  niacinamide 500 MG tablet Take 500 mg by mouth 2 (two) times daily with a meal.   Yes [provider]  omega-3 acid ethyl esters (LOVAZA) 1 g capsule TAKE 2 CAPSULES BY MOUTH 2 TIMES DAILY. Patient taking differently: Take 2 g by mouth 2 (two) times daily.  03/03/19  Yes Rita Ohara, MD  PARoxetine (PAXIL) 30 MG tablet Take 60 mg by mouth daily.  07/17/14  Yes [provider]  loperamide (IMODIUM A-D) 2 MG tablet Take 2 mg by mouth 4 (four) times daily as needed for diarrhea or loose stools.    [provider]   No results found.  Positive ROS: All other systems have been reviewed and were otherwise negative with the exception of those mentioned in the HPI and as above.  Physical Exam: General: Alert, no acute distress Cardiovascular: No pedal edema Respiratory: No cyanosis, no use of accessory musculature GI: No organomegaly, abdomen is soft and non-tender Skin: No lesions in the area of chief complaint Neurologic: Sensation intact distally Psychiatric: Patient is competent for consent with normal mood and affect Lymphatic: No axillary or cervical lymphadenopathy  MUSCULOSKELETAL:  Right upper extremity:  She has multiple seborrheic keratoses as well as superficial dermatitis.  These are not infectious appearing.  Otherwise neurovascular intact.  Assessment: 1.  Right shoulder rotator cuff arthropathy  Plan: -Our plan for today will  be for reverse shoulder arthroplasty of the right shoulder.  This is indicated for her uncompensated right shoulder rotator cuff arthropathy.  She has had failure of conservative treatment options and has elected for the above surgery.  -We again reviewed the procedure in detail.  We also discussed the risk of bleeding, infection, damage to surrounding neurovascular structures, dislocation, fracture, persistent pain, stiffness, DVT, and the risk of anesthesia.  She has provided informed consent.  -We will plan for admission postoperatively and discharge home tomorrow.    Nicholes Stairs, MD Cell [phone]    04/11/2019 12:02 PM

## 2019-04-11 NOTE — Brief Op Note (Signed)
04/11/2019  2:41 PM  PATIENT:  [name]  74 y.o. female  PRE-OPERATIVE DIAGNOSIS:  Osteoarthritis of joint of right shoulder region  POST-OPERATIVE DIAGNOSIS:  Osteoarthritis of joint of right shoulder region  PROCEDURE:  Procedure(s) with comments: REVERSE SHOULDER ARTHROPLASTY (Right) - Regional Block  SURGEON:  Surgeon(s) and Role:    * Stann Mainland, Elly Modena, MD - Primary  PHYSICIAN ASSISTANT:   ASSISTANTS: Katy Apo, RNFA   ANESTHESIA:   regional and general  EBL:  150 cc  BLOOD ADMINISTERED:none  DRAINS: none   LOCAL MEDICATIONS USED:  NONE  SPECIMEN:  No Specimen  DISPOSITION OF SPECIMEN:  N/A  COUNTS:  YES  TOURNIQUET:  * No tourniquets in log *  DICTATION: .Note written in EPIC  PLAN OF CARE: Admit for overnight observation  PATIENT DISPOSITION:  PACU - hemodynamically stable.   Delay start of Pharmacological VTE agent (>24hrs) due to surgical blood loss or risk of bleeding: not applicable

## 2019-04-11 NOTE — Anesthesia Postprocedure Evaluation (Signed)
Anesthesia Post Note  Patient: Beverly [name]  Procedure(s) Performed: REVERSE SHOULDER ARTHROPLASTY (Right Shoulder)     Patient location during evaluation: PACU Anesthesia Type: General and Regional Level of consciousness: awake and alert Pain management: pain level controlled Vital Signs Assessment: post-procedure vital signs reviewed and stable Respiratory status: spontaneous breathing, nonlabored ventilation, respiratory function stable and patient connected to nasal cannula oxygen Cardiovascular status: blood pressure returned to baseline and stable Postop Assessment: no apparent nausea or vomiting Anesthetic complications: no    Last Vitals:  Vitals:   04/11/19 1500 04/11/19 1515  BP:  118/65  Pulse: 65 70  Resp: 13 13  Temp: 36.7 C   SpO2: (!) 89% 100%    Last Pain:  Vitals:   04/11/19 1530  TempSrc:   PainSc: 0-No pain                 Mayson Mcneish,W. EDMOND

## 2019-04-11 NOTE — Transfer of Care (Signed)
Immediate Anesthesia Transfer of Care Note  Patient: Beverly [name]  Procedure(s) Performed: REVERSE SHOULDER ARTHROPLASTY (Right Shoulder)  Patient Location: PACU  Anesthesia Type:GA combined with regional for post-op pain  Level of Consciousness: awake  Airway & Oxygen Therapy: Patient Spontanous Breathing and Patient connected to face mask oxygen  Post-op Assessment: Report given to RN and Post -op Vital signs reviewed and stable  Post vital signs: Reviewed and stable  Last Vitals:  Vitals Value Taken Time  BP 142/109 04/11/19 1459  Temp    Pulse 67 04/11/19 1503  Resp 10 04/11/19 1503  SpO2 96 % 04/11/19 1503  Vitals shown include unvalidated device data.  Last Pain:  Vitals:   04/11/19 1500  TempSrc:   PainSc: (P) 0-No pain         Complications: No apparent anesthesia complications

## 2019-04-11 NOTE — Anesthesia Procedure Notes (Signed)
Anesthesia Regional Block: Interscalene brachial plexus block   Pre-Anesthetic Checklist: ,, timeout performed, Correct Patient, Correct Site, Correct Laterality, Correct Procedure, Correct Position, site marked, Risks and benefits discussed, pre-op evaluation,  At surgeon's request and post-op pain management  Laterality: Right  Prep: Maximum Sterile Barrier Precautions used, chloraprep       Needles:  Injection technique: Single-shot  Needle Type: Echogenic Stimulator Needle     Needle Length: 5cm  Needle Gauge: 22     Additional Needles:   Procedures:, nerve stimulator,,, ultrasound used (permanent image in chart),,,,   Nerve Stimulator or Paresthesia:  Response: Biceps response,   Additional Responses:   Narrative:  Start time: 04/11/2019 11:35 AM End time: 04/11/2019 11:45 AM Injection made incrementally with aspirations every 5 mL. Anesthesiologist: Roderic Palau, MD  Additional Notes: 2% Lidocaine skin wheel.

## 2019-04-11 NOTE — Discharge Instructions (Signed)
-  Maintain postoperative bandage on right shoulder until your follow-up appointment.  This bandage is waterproof and you may shower with this in place.  Please do not submerge underwater.  -Maintain your sling at all times unless doing your daily exercises or ADLs.    -No lifting with the right arm above activities of daily living.  -Apply ice to the right shoulder for 30 minutes/h that you are able around-the-clock.  -For mild to moderate pain use your Tylenol and Celebrex around-the-clock.  For breakthrough pain use oxycodone as needed.  -You may resume your 81 mg aspirin once per day for DVT prophylaxis.  -Return to see Dr. Stann Mainland in 2 weeks for routine postoperative check.

## 2019-04-12 ENCOUNTER — Encounter: Payer: Self-pay | Admitting: *Deleted

## 2019-04-12 LAB — HEMOGLOBIN AND HEMATOCRIT, BLOOD
HCT: 35 % — ABNORMAL LOW (ref 36.0–46.0)
Hemoglobin: 11.5 g/dL — ABNORMAL LOW (ref 12.0–15.0)

## 2019-04-12 LAB — BASIC METABOLIC PANEL
Anion gap: 10 (ref 5–15)
BUN: 8 mg/dL (ref 8–23)
CO2: 24 mmol/L (ref 22–32)
Calcium: 8.9 mg/dL (ref 8.9–10.3)
Chloride: 107 mmol/L (ref 98–111)
Creatinine, Ser: 0.56 mg/dL (ref 0.44–1.00)
GFR calc Af Amer: >60 mL/min (ref >60)
GFR calc non Af Amer: >60 mL/min (ref >60)
Glucose, Bld: 130 mg/dL — ABNORMAL HIGH (ref 70–99)
Potassium: 4.1 mmol/L (ref 3.5–5.1)
Sodium: 141 mmol/L (ref 135–145)

## 2019-04-12 NOTE — Evaluation (Addendum)
Occupational Therapy Evaluation Patient Details Name: Beverly [name] MRN: [id number] DOB: [birth year] Today's Date: 04/12/2019    History of Present Illness Pt is a 74 y/o female now s/p reverse R TSA. PMHx includes anxiety, asthma, R rotator cuff tear 02/2019, L shoulder surgery 02/2018   Clinical Impression   This 75 y/o female presents with the above. PTA pt living alone and reports independent with ADL and functional mobility. Pt currently with limitations given R shoulder deficits s/p surgery. Pt still strongly feeling effects from nerve block this session. She currently requires up to maxA for UB ADL including sling management, minguard for LB ADL and supervision-minguard assist for functional transfers. Pt reports daughter to stay initially to assist with ADL/iADL PRN. Educated on HEP and demonstrated throughout, pt engaging in ROM within permissible limits but doing so on a limited basis given nerve block still quite active. Pt able to verbalize good understanding of all education provided including shoulder precautions, sling management, HEP, safety and compensatory techniques for completing ADL and functional transfers. Given available family assist at time of discharge feel pt safe to return home from OT standpoint once medically cleared. Recommend pt follow up as ordered by MD for follow up therapies. No further acute OT needs identified at this time. Will sign off, thank you for this referral.     Follow Up Recommendations  Follow surgeon's recommendation for DC plan and follow-up therapies;Supervision/Assistance - 24 hour    Equipment Recommendations  None recommended by OT           Precautions / Restrictions Precautions Precautions: Fall;Shoulder Type of Shoulder Precautions: Wear sling at all times except ADL/exercise, NWB UE, okay for AROM to e/w/h, okay for A/PROM to shoulder within pt tolerance and following limits: FF 0-90*, ER 0-30*, No abduction  Shoulder  Interventions: Shoulder sling/immobilizer;At all times;Off for dressing/bathing/exercises Precaution Booklet Issued: Yes (comment) Precaution Comments: issued and reviewed throughout session Required Braces or Orthoses: Sling Restrictions Weight Bearing Restrictions: Yes RUE Weight Bearing: Non weight bearing      Mobility Bed Mobility Overal bed mobility: Needs Assistance Bed Mobility: Supine to Sit     Supine to sit: Supervision;HOB elevated     General bed mobility comments: for safety, use of bed rail  Transfers Overall transfer level: Needs assistance Equipment used: None Transfers: Sit to/from Stand Sit to Stand: Supervision         General transfer comment: for safety and balance    Balance Overall balance assessment: Needs assistance Sitting-balance support: Feet supported Sitting balance-Leahy Scale: Good     Standing balance support: No upper extremity supported Standing balance-Leahy Scale: Fair                             ADL either performed or assessed with clinical judgement   ADL Overall ADL's : Needs assistance/impaired Eating/Feeding: Modified independent;Sitting   Grooming: Supervision/safety;Sitting   Upper Body Bathing: Supervision/ safety;Sitting   Lower Body Bathing: Min guard;Sit to/from stand Lower Body Bathing Details (indicate cue type and reason): educated on performing bathing ADL in sitting initially for increased safety, pt verbalizing understanding Upper Body Dressing : Sitting;Maximal assistance;Moderate assistance Upper Body Dressing Details (indicate cue type and reason): including sling management Lower Body Dressing: Sit to/from stand;Min guard   Toilet Transfer: Min guard;Ambulation   Toileting- Clothing Manipulation and Hygiene: Min guard;Sit to/from stand       Functional mobility during ADLs: Min guard;Supervision/safety  Pertinent Vitals/Pain Pain Assessment:  No/denies pain(still feeling nerve block)     Hand Dominance Left   Extremity/Trunk Assessment Upper Extremity Assessment Upper Extremity Assessment: RUE deficits/detail RUE Deficits / Details: s/p reverse R TSA, still feeling nerve block RUE: Unable to fully assess due to immobilization RUE Sensation: decreased light touch;decreased proprioception RUE Coordination: decreased fine motor;decreased gross motor   Lower Extremity Assessment Lower Extremity Assessment: Overall WFL for tasks assessed   Cervical / Trunk Assessment Cervical / Trunk Assessment: Normal   Communication Communication Communication: No difficulties   Cognition Arousal/Alertness: Awake/alert Behavior During Therapy: WFL for tasks assessed/performed Overall Cognitive Status: Within Functional Limits for tasks assessed                                     General Comments       Exercises Exercises: General Upper Extremity;Shoulder;Hand exercises General Exercises - Upper Extremity Shoulder Flexion: Self ROM;PROM;Right(x3 reps, to 30*, demo mostly as pt still feeling block ) Elbow Flexion: Self ROM;Right;5 reps;Seated;AAROM Elbow Extension: AAROM;Self ROM;5 reps;Right Wrist Flexion: AAROM;Self ROM;Right;5 reps;Seated Wrist Extension: AAROM;Self ROM;Right;5 reps;Seated Digit Composite Flexion: AAROM;Self ROM;5 reps;Right Composite Extension: AAROM;Self ROM;Right;5 reps Shoulder Exercises Neck Flexion: AROM;Seated Neck Extension: AROM;Seated Neck Lateral Flexion - Right: AROM;Seated Neck Lateral Flexion - Left: AROM;Seated Hand Exercises Forearm Supination: AAROM;Self ROM;Right;5 reps Forearm Pronation: AAROM;Right;5 reps;Seated   Shoulder Instructions Shoulder Instructions Donning/doffing shirt without moving shoulder: Moderate assistance;Patient able to independently direct caregiver Method for sponge bathing under operated UE: Supervision/safety Donning/doffing sling/immobilizer:  Maximal assistance;Patient able to independently direct caregiver Correct positioning of sling/immobilizer: Moderate assistance;Patient able to independently direct caregiver ROM for elbow, wrist and digits of operated UE: Supervision/safety Proper positioning of operated UE when showering: Supervision/safety Positioning of UE while sleeping: [place] expects to be discharged to:: Private residence Living Arrangements: Alone Available Help at Discharge: Family;Available 24 hours/day(daughter to stay initially) Type of Home: House Home Access: Stairs to enter [employer] of Steps: 2   Home Layout: One level     Bathroom Shower/Tub: Occupational psychologist: Handicapped height     Home Equipment: Grab bars - tub/shower;Grab bars - toilet;Hand held shower head;Shower seat - built in          Prior Functioning/Environment Level of Independence: Independent                 OT Problem List: Decreased range of motion;Decreased strength;Impaired UE functional use;Decreased knowledge of precautions      OT Treatment/Interventions:      OT Goals(Current goals can be found in the care plan section) Acute Rehab OT Goals Patient Stated Goal: get back to volunteering (giving out Covid vaccines)  OT Goal Formulation: All assessment and education complete, DC therapy Time For Goal Achievement: 04/26/19 Potential to Achieve Goals: Good  OT Frequency:     Barriers to D/C:            Co-evaluation              AM-PAC OT "6 Clicks" Daily Activity     Outcome Measure Help from another person eating meals?: None Help from another person taking care of personal grooming?: A Little Help from another person toileting, which includes using toliet, bedpan, or urinal?: A Little Help from another person bathing (including washing, rinsing, drying)?: A Little Help from another person to put on and taking  off regular upper body  clothing?: A Lot Help from another person to put on and taking off regular lower body clothing?: A Little 6 Click Score: 18   End of Session Equipment Utilized During Treatment: Other (comment)(sling) Nurse Communication: Mobility status  Activity Tolerance: Patient tolerated treatment well Patient left: with call bell/phone within reach(seated EOB)  OT Visit Diagnosis: Muscle weakness (generalized) (M62.81)                Time: [id number] OT Time Calculation (min): 37 min Charges:  OT General Charges $OT Visit: 1 Visit OT Evaluation $OT Eval Moderate Complexity: 1 Mod OT Treatments $Self Care/Home Management : 8-22 mins  Lou Cal, OT [facility] Pager [phone] Office [phone]   Raymondo Band 04/12/2019, 10:16 AM

## 2019-04-12 NOTE — Progress Notes (Signed)
Patient called the staff and informed of bruised on her leg from under the sink board. RN assessed the said area without noticing any bruise or scratch on patient's leg. Ice pack was placed for comfort and will continue to monitor area

## 2019-04-12 NOTE — Care Management (Signed)
Disregard Potential CC 44. Prior Authorization Information Approved Admission Status: Inpatient; Prior Authorization Number: [id number];

## 2019-04-12 NOTE — Plan of Care (Signed)
Patient alert and oriented, mae's well, voiding adequate amount of urine, swallowing without difficulty, no c/o pain at time of discharge. Patient discharged home with family. Discharged instructions given to patient. Patient and family stated understanding of instructions given. Patient has an appointment with Dr. Stann Mainland in 2 weeks.

## 2019-04-13 MED FILL — OXYCODONE-APAP 5-325MG: 5-325 | 5 days supply | Qty: 20 | Fill #0

## 2019-04-15 NOTE — Discharge Summary (Signed)
Patient ID: AHMIRACLE UPCHURCH MRN: [id number] DOB/AGE: 1945/07/17 74 y.o.  Admit date: 04/11/2019 Discharge date: 04/12/2019  Primary Diagnosis: Right shoulder rotator cuff arthropathy  Admission Diagnoses:  Past Medical History:  Diagnosis Date  . Allergy   . Amaurosis fugax    negative w/u through [facility]  . Anxiety   . Asthma   . Back pain    Dr Joline Salt 02/2010-epidural injection x 2 at L4-5 with good effect  . 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 BCC (basal cell carcinoma of skin) 05/09/2014   mid lower back  . BCC (basal cell carcinoma of skin) 05/03/2017   right low back  . BCC (basal cell carcinoma of skin) 05/03/2017   left upper back  . BCC (basal cell carcinoma of skin) 07/05/2018   left mid back  . BCC (basal cell carcinoma of skin) 05/20/1992   upper back  . BCC (basal cell carcinoma of skin) 07/29/1993   left sholder medial  . BCC (basal cell carcinoma of skin) 07/29/1993   left sholder lateral  . BCC (basal cell carcinoma of skin) 07/29/1993   right thigh  . BCC (basal cell carcinoma of skin) 07/29/1993   right sholder  . BCC (basal cell carcinoma of skin) 12/22/1994   right mid forearm  . BCC (basal cell carcinoma of skin) 12/22/1994   right upper forearm  . BCC (basal cell carcinoma of skin) 12/22/1994   lower right upper arm  . BCC (basal cell carcinoma of skin) 12/22/1994   right upper arm sholder  . BCC (basal cell carcinoma of skin) 08/11/1995   left leg below knee  . BCC (basal cell carcinoma of skin) 04/11/2002   mid back  . BCC (basal cell carcinoma of skin) 12/10/2002   right center upper back  . BCC (basal cell carcinoma of skin) 05/26/2005   right post sholder  . BCC (basal cell carcinoma) 05/09/2014   left inner shin  . BCC (basal cell carcinoma) 06/12/2014   left forearm  . Bowen's disease 10/07/1994   right post knee, right inner forearm/wrist  . Bowen's disease 08/11/1995   left sholder  . Cataract    left    . Chronic pain   . Depression   . Diverticulosis of colon 1998   mild  . DJD (degenerative joint disease)   . Duodenal ulcer    h/o  . GERD (gastroesophageal reflux disease)   . History of SCC (squamous cell carcinoma) of skin    Dr. Denna Haggard  . History of sinus bradycardia   . HLD (hyperlipidemia)    hypertriglyceridemia  . Hypertensive retinopathy of both eyes 11/18/2017  . IBS (irritable bowel syndrome)   . Internal hemorrhoids 1998  . Mitral regurgitation    mild  . Ocular migraine 10/31/14   Dr.Hecker  . Osteoarthritis    feet,shoulder,neck,back,hips and hands.  . Pneumonia   . PONV (postoperative nausea and vomiting)   . Rotator cuff tear, right 02/2019   infraspinatus and supraspinatus, and dislocation of long head of bicep tendon (Dr. Alvan Dame)  . SCC (squamous cell carcinoma) 11/26/2014   left hand, right hand, right deltoid  . SCC (squamous cell carcinoma) 05/03/2017   left cheek  . Sleep apnea    uses a mouth guard nightly  . Squamous cell carcinoma in situ (SCCIS) 07/05/2018   left hand  . Vitamin D deficiency    mild   Discharge Diagnoses:   Active Problems:   S/P reverse total  shoulder arthroplasty, right  Estimated body mass index is 28.39 kg/m as calculated from the following:   Height as of this encounter: 5\' 5"  (1.651 m).   Weight as of this encounter: 77.4 kg.  Procedure:  Procedure(s) (LRB): REVERSE SHOULDER ARTHROPLASTY (Right)   Consults: None  HPI: Arnett presents for right shoulder reverse arthroplasty for rotator cuff arthropathy.  She has failed conservative treatment options and had a thorough discussion regarding moving forward with operative management. Laboratory Data: Admission on 04/11/2019, Discharged on 04/12/2019  Component Date Value Ref Range Status  . Sodium 04/12/2019 141  135 - 145 mmol/L Final  . Potassium 04/12/2019 4.1  3.5 - 5.1 mmol/L Final  . Chloride 04/12/2019 107  98 - 111 mmol/L Final  . CO2 04/12/2019 24  22 - 32  mmol/L Final  . Glucose, Bld 04/12/2019 130* 70 - 99 mg/dL Final   Glucose reference range applies only to samples taken after fasting for at least 8 hours.  . BUN 04/12/2019 8  8 - 23 mg/dL Final  . Creatinine, Ser 04/12/2019 0.56  0.44 - 1.00 mg/dL Final  . Calcium 04/12/2019 8.9  8.9 - 10.3 mg/dL Final  . GFR calc non Af Amer 04/12/2019 >60  >60 mL/min Final  . GFR calc Af Amer 04/12/2019 >60  >60 mL/min Final  . Anion gap 04/12/2019 10  5 - 15 Final   Performed at [facility], [place] [address] [place], [place] [address]  . Hemoglobin 04/12/2019 11.5* 12.0 - 15.0 g/dL Final  . HCT 04/12/2019 35.0* 36.0 - 46.0 % Final   Performed at [facility], [place] [address] [place], [place] [address]  Hospital Outpatient Visit on 04/07/2019  Component Date Value Ref Range Status  . SARS Coronavirus 2 04/07/2019 NEGATIVE  NEGATIVE Final   Comment: (NOTE) SARS-CoV-2 target nucleic acids are NOT DETECTED. The SARS-CoV-2 RNA is generally detectable in upper and lower respiratory specimens during the acute phase of infection. Negative results do not preclude SARS-CoV-2 infection, do not rule out co-infections with other pathogens, and should not be used as the sole basis for treatment or other patient management decisions. Negative results must be combined with clinical observations, patient history, and epidemiological information. The expected result is Negative. Fact Sheet for Patients: SugarRoll.be Fact Sheet for Healthcare Providers: https://www.woods-mathews.com/ This test is not yet approved or cleared by the [place] FDA and  has been authorized for detection and/or diagnosis of SARS-CoV-2 by FDA under an Emergency Use Authorization (EUA). This EUA will remain  in effect (meaning this test can be used) for the duration of the COVID-19 declaration under Section 56                          4(b)(1) of the Act, 21 U.S.C. section  360bbb-3(b)(1), unless the authorization is terminated or revoked sooner. Performed at [facility], [place] [address] [place], [place] [address]   Hospital Outpatient Visit on 04/07/2019  Component Date Value Ref Range Status  . WBC 04/07/2019 9.9  4.0 - 10.5 K/uL Final  . RBC 04/07/2019 4.37  3.87 - 5.11 MIL/uL Final  . Hemoglobin 04/07/2019 14.7  12.0 - 15.0 g/dL Final  . HCT 04/07/2019 45.7  36.0 - 46.0 % Final  . MCV 04/07/2019 104.6* 80.0 - 100.0 fL Final  . MCH 04/07/2019 33.6  26.0 - 34.0 pg Final  . MCHC 04/07/2019 32.2  30.0 - 36.0 g/dL Final  .  RDW 04/07/2019 14.9  11.5 - 15.5 % Final  . Platelets 04/07/2019 200  150 - 400 K/uL Final  . nRBC 04/07/2019 0.0  0.0 - 0.2 % Final   Performed at [facility], [place] [address] [place], [place] [address]  . Sodium 04/07/2019 140  135 - 145 mmol/L Final  . Potassium 04/07/2019 3.8  3.5 - 5.1 mmol/L Final  . Chloride 04/07/2019 106  98 - 111 mmol/L Final  . CO2 04/07/2019 25  22 - 32 mmol/L Final  . Glucose, Bld 04/07/2019 141* 70 - 99 mg/dL Final  . BUN 04/07/2019 11  8 - 23 mg/dL Final  . Creatinine, Ser 04/07/2019 0.67  0.44 - 1.00 mg/dL Final  . Calcium 04/07/2019 9.3  8.9 - 10.3 mg/dL Final  . GFR calc non Af Amer 04/07/2019 >60  >60 mL/min Final  . GFR calc Af Amer 04/07/2019 >60  >60 mL/min Final  . Anion gap 04/07/2019 9  5 - 15 Final   Performed at [facility], [place] [address] [place], [place] [address]  . MRSA, PCR 04/07/2019 NEGATIVE  NEGATIVE Final  . Staphylococcus aureus 04/07/2019 NEGATIVE  NEGATIVE Final   Comment: (NOTE) The Xpert SA Assay (FDA approved for NASAL specimens in patients 38 years of age and older), is one component of a comprehensive surveillance program. It is not intended to diagnose infection nor to guide or monitor treatment. Performed at [facility], [place] [address], [place], [place] [address]      X-Rays:CT SHOULDER RIGHT WO CONTRAST  Result Date:  03/31/2019 CLINICAL DATA:  Preoperative for right shoulder replacement. EXAM: CT OF THE UPPER RIGHT EXTREMITY WITHOUT CONTRAST TECHNIQUE: Multidetector CT imaging of the upper right extremity was performed according to the standard protocol. COMPARISON:  MRI from 03/17/2019 FINDINGS: Bones/Joint/Cartilage Moderate spurring of the humeral head and mild spurring of the glenoid. Mild degenerative AC joint spurring. No appreciable fracture or acute bony finding. The right clavicle and regional ribs appear unremarkable. Ligaments Suboptimally assessed by CT. Muscles and Tendons Mild atrophy of the supraspinatus and infraspinatus muscles. Rotator cuff tendons better assessed at MRI. Soft tissues Atherosclerotic calcification of the aortic arch. In the lungs there is subtle accentuated reticulation and ground-glass densities, technically nonspecific but potentially a manifestation of hypersensitivity pneumonitis, fibrosis, or atypical pneumonia. IMPRESSION: 1. Moderate osteoarthritis of the right glenohumeral joint. Mild degenerative AC joint spurring. 2. Mild atrophy of the supraspinatus and infraspinatus muscles. Rotator cuff tendons better assessed at MRI. 3. Faint accentuated reticulation and ground-glass densities in the lungs, technically nonspecific but potentially a manifestation of hypersensitivity pneumonitis, fibrosis, or atypical pneumonia. Aortic Atherosclerosis (ICD10-I70.0). Electronically Signed   By: Van Clines M.D.   On: 03/31/2019 18:26   MR SHOULDER RIGHT WO CONTRAST  Result Date: 03/17/2019 CLINICAL DATA:  Chronic progressive right shoulder pain and weakness for 5 months. The patient also fell in late December 2020. EXAM: MRI OF THE RIGHT SHOULDER WITHOUT CONTRAST TECHNIQUE: Multiplanar, multisequence MR imaging of the shoulder was performed. No intravenous contrast was administered. COMPARISON:  None. FINDINGS: Rotator cuff: There are large full-thickness full width retracted tears of the  infraspinatus and supraspinatus tendons. The defect is approximately 5 x 6 cm. Subscapularis and teres minor tendons are intact. Muscles: There is atrophy and edema in the infraspinatus and supraspinatus muscles. There is also edema in the teres minor muscle. Biceps long head: Long head of the biceps tendon is dislocated medially and lies anterior to the  subscapularis tendon. Acromioclavicular Joint: Minimal AC joint arthropathy. Type 2 acromion. Glenohumeral Joint: Small glenohumeral joint effusion. The humeral head is superiorly subluxed and articulates with the undersurface of the acromion. Small osteophyte on the inferior aspect of the articular surface of the humeral head. Slight thinning of the articular cartilage of the humeral head. Labrum:  Intact. Bones: Slight degenerative changes of the greater and lesser tuberosities. Other: None IMPRESSION: 1. Large full-thickness full width retracted tears of the infraspinatus and supraspinatus tendons. 2. Dislocation of the long head of the biceps tendon. 3. Slight arthritic changes of the glenohumeral joint. Electronically Signed   By: Lorriane Shire M.D.   On: 03/17/2019 14:45   DG Shoulder Right Port  Result Date: 04/11/2019 CLINICAL DATA:  Right shoulder arthroplasty EXAM: PORTABLE RIGHT SHOULDER COMPARISON:  03/31/2019 FINDINGS: Single frontal view of the right shoulder demonstrates right total shoulder arthroplasty in the expected position with no evidence of complication. Postsurgical changes are seen in the soft tissues. The right chest is clear. IMPRESSION: 1. Unremarkable right shoulder arthroplasty. Electronically Signed   By: Randa Ngo M.D.   On: 04/11/2019 19:06    EKG: Orders placed or performed in visit on 03/30/19  . EKG 12-Lead     Hospital Course: MEILIN LICURSI is a 74 y.o. who was admitted to Hospital. They were brought to the operating room on 04/11/2019 and underwent Procedure(s): [place].  Patient  tolerated the procedure well and was later transferred to the recovery room and then to the orthopaedic floor for postoperative care.  They were given PO and IV analgesics for pain control following their surgery.  They were given 24 hours of postoperative antibiotics of  Anti-infectives (From admission, onward)   Start     Dose/Rate Route Frequency Ordered Stop   04/11/19 1930  ceFAZolin (ANCEF) IVPB 1 g/50 mL premix     1 g 100 mL/hr over 30 Minutes Intravenous Every 6 hours 04/11/19 1634 04/12/19 0655   04/11/19 1418  vancomycin (VANCOCIN) powder  Status:  Discontinued       As needed 04/11/19 1419 04/11/19 1455   04/11/19 1045  ceFAZolin (ANCEF) IVPB 2g/100 mL premix     2 g 200 mL/hr over 30 Minutes Intravenous On call to O.R. 04/11/19 1036 04/11/19 1305   04/11/19 1041  ceFAZolin (ANCEF) 2-4 GM/100ML-% IVPB    Note to Pharmacy: Therese Sarah   : cabinet override      04/11/19 1041 04/11/19 1315     and started on DVT prophylaxis in the form of Aspirin.    OT was ordered for total joint protocol.  Discharge planning consulted to help with postop disposition and equipment needs.  Patient had a good night on the evening of surgery.  They started to get up OOB with therapy on day one.  Patient was seen in rounds and was ready to go home.   Diet: Regular diet Activity:NWB Follow-up:in 2 weeks Disposition - Home Discharged Condition: good   Discharge Instructions    Call MD / Call 911   Complete by: As directed    If you experience chest pain or shortness of breath, CALL 911 and be transported to the hospital emergency room.  If you develope a fever above 101 F, pus (white drainage) or increased drainage or redness at the wound, or calf pain, call your surgeon's office.   Constipation Prevention   Complete by: As directed    Drink plenty of fluids.  Prune juice may  be helpful.  You may use a stool softener, such as Colace (over the counter) 100 mg twice a day.  Use MiraLax (over  the counter) for constipation as needed.   Diet - low sodium heart healthy   Complete by: As directed    Increase activity slowly as tolerated   Complete by: As directed      Allergies as of 04/12/2019      Reactions   Adhesive [tape] Rash   Codeine Rash      Medication List    TAKE these medications   acetaminophen 500 MG tablet Commonly known as: TYLENOL Take 1,000 mg by mouth every 6 (six) hours as needed for moderate pain.   albuterol 108 (90 Base) MCG/ACT inhaler Commonly known as: VENTOLIN HFA Inhale 2 puffs into the lungs every 6 (six) hours as needed for wheezing or shortness of breath.   aspirin EC 81 MG tablet Take 81 mg by mouth daily.   atorvastatin 40 MG tablet Commonly known as: LIPITOR Take 1 tablet (40 mg total) by mouth daily.   Azelastine HCl 137 MCG/SPRAY Soln Place 2 Pump into both nostrils 2 (two) times a day.   Calcium 600+D 600-200 MG-UNIT Tabs Generic drug: Calcium Carbonate-Vitamin D Take 1 tablet by mouth 2 (two) times daily.   celecoxib 200 MG capsule Commonly known as: CELEBREX Take 1 capsule (200 mg total) by mouth 2 (two) times daily.   diclofenac Sodium 1 % Gel Commonly known as: VOLTAREN Apply 2 g topically 4 (four) times daily as needed (joint pain).   esomeprazole 40 MG capsule Commonly known as: NEXIUM TAKE 1 CAPSULE BY MOUTH DAILY BEFORE SUPPER. What changed:   how much to take  how to take this  when to take this  additional instructions   estradiol 0.5 MG tablet Commonly known as: ESTRACE TAKE 1 TABLET BY MOUTH ONCE DAILY What changed:   how much to take  how to take this  when to take this   fentaNYL 25 MCG/HR Commonly known as: [place] 1 patch onto the skin every 3 (three) days.   fluticasone 220 MCG/ACT inhaler Commonly known as: Flovent HFA INHALE 2 PUFFS INTO THE LUNGS 2 (TWO) TIMES DAILY. What changed:   how much to take  how to take this  when to take this  additional  instructions   fluticasone 50 MCG/ACT nasal spray Commonly known as: FLONASE PLACE 2 SPRAYS INTO THE NOSE DAILY. What changed:   how much to take  how to take this  when to take this  additional instructions   guaiFENesin 600 MG 12 hr tablet Commonly known as: MUCINEX Take 600 mg by mouth 2 (two) times daily as needed for cough or to loosen phlegm.   HYDROcodone-acetaminophen 7.5-325 MG tablet Commonly known as: NORCO Take 1 tablet by mouth in the morning and at bedtime.   loperamide 2 MG tablet Commonly known as: IMODIUM A-D Take 2 mg by mouth 4 (four) times daily as needed for diarrhea or loose stools.   loratadine 10 MG tablet Commonly known as: CLARITIN Take 10 mg by mouth daily.   methocarbamol 500 MG tablet Commonly known as: ROBAXIN Take 500 mg by mouth in the morning and at bedtime.   montelukast 10 MG tablet Commonly known as: SINGULAIR Take 1 tablet (10 mg total) by mouth at bedtime.   multivitamin with minerals tablet Take 1 tablet by mouth daily.   Neurontin 800 MG tablet Generic drug: gabapentin Take 800 mg  by mouth at bedtime.   niacinamide 500 MG tablet Take 500 mg by mouth 2 (two) times daily with a meal.   omega-3 acid ethyl esters 1 g capsule Commonly known as: LOVAZA TAKE 2 CAPSULES BY MOUTH 2 TIMES DAILY. What changed: See the new instructions.   PARoxetine 30 MG tablet Commonly known as: PAXIL Take 60 mg by mouth daily.   [place] 4 % Crea Generic drug: Fluorouracil Apply 1 application topically every 3 (three) days.      Follow-up Information    Nicholes Stairs, MD. Schedule an appointment as soon as possible for a visit in 2 weeks.   Specialty: Orthopedic Surgery Why: For wound re-check Contact information: [address] [place] [address] [id number]           Signed: Geralynn Rile, MD Orthopaedic Surgery 04/15/2019, 9:31 AM

## 2019-04-21 MED FILL — AMOXICILLIN 500 MG CAPSULE: 500 | 5 days supply | Qty: 20 | Fill #0

## 2019-04-23 ENCOUNTER — Telehealth: Payer: Medicare PPO | Admitting: Emergency Medicine

## 2019-04-23 DIAGNOSIS — N3001 Acute cystitis with hematuria: Secondary | ICD-10-CM | POA: Diagnosis not present

## 2019-04-23 MED ORDER — CIPROFLOXACIN HCL 500 MG PO TABS: 500.0000 mg | ORAL_TABLET | Freq: Two times a day (BID) | ORAL | 0 refills | Status: DC

## 2019-04-23 NOTE — Progress Notes (Signed)
We are sorry that you are not feeling well.  Here is how we plan to help!  Based on what you shared with me it looks like you most likely have a simple urinary tract infection.  A UTI (Urinary Tract Infection) is a bacterial infection of the bladder.  Most cases of urinary tract infections are simple to treat but a key part of your care is to encourage you to drink plenty of fluids and watch your symptoms carefully.  I have prescribed ciprofloxacin 500 mg twice daily for 7 days. Given your recent shoulder replacement surgery, you were likely catheterized and will need this medication to cover for certain bacteria.  Your symptoms should gradually improve. Call us if the burning in your urine worsens, you develop worsening fever, back pain or pelvic pain or if your symptoms do not resolve after completing the antibiotic.  Urinary tract infections can be prevented by drinking plenty of water to keep your body hydrated.  Also be sure when you wipe, wipe from front to back and don't hold it in!  If possible, empty your bladder every 4 hours.  Your e-visit answers were reviewed by a board certified advanced clinical practitioner to complete your personal care plan.  Depending on the condition, your plan could have included both over the counter or prescription medications.  If there is a problem please reply  once you have received a response from your provider.  Your safety is important to [place].  If you have drug allergies check your prescription carefully.    You can use MyChart to ask questions about today's visit, request a non-urgent call back, or ask for a work or school excuse for 24 hours related to this e-Visit. If it has been greater than 24 hours you will need to follow up with your provider, or enter a new e-Visit to address those concerns.   You will get an e-mail in the next two days asking about your experience.  I hope that your e-visit has been valuable and will speed your recovery.  Thank you for using e-visits.   Greater than 5 but less than 10 minutes spent researching, coordinating, and implementing care for this patient today

## 2019-05-01 MED FILL — GABAPENTIN 800 MG TABS: 800 | 90 days supply | Qty: 90 | Fill #2

## 2019-05-10 MED FILL — GABAPENTIN 800 MG TABS: 800 | 90 days supply | Qty: 90 | Fill #2

## 2019-05-11 MED FILL — PARoxetine HCL 40 MG TABS: 40 | 90 days supply | Qty: 135 | Fill #1

## 2019-05-11 MED FILL — MONTELUKAST SOD 10 MG TAB: 10 | 90 days supply | Qty: 90 | Fill #3

## 2019-05-11 MED FILL — ATORVASTATIN 40 MG TABLET: 40 | 90 days supply | Qty: 90 | Fill #1

## 2019-05-15 ENCOUNTER — Other Ambulatory Visit: Payer: Self-pay | Admitting: Student

## 2019-05-15 DIAGNOSIS — M25551 Pain in right hip: Secondary | ICD-10-CM

## 2019-05-18 DIAGNOSIS — M25611 Stiffness of right shoulder, not elsewhere classified: Secondary | ICD-10-CM | POA: Diagnosis not present

## 2019-05-18 DIAGNOSIS — M47816 Spondylosis without myelopathy or radiculopathy, lumbar region: Secondary | ICD-10-CM | POA: Diagnosis not present

## 2019-05-18 DIAGNOSIS — M5416 Radiculopathy, lumbar region: Secondary | ICD-10-CM | POA: Diagnosis not present

## 2019-05-18 DIAGNOSIS — G894 Chronic pain syndrome: Secondary | ICD-10-CM | POA: Diagnosis not present

## 2019-05-18 DIAGNOSIS — M25511 Pain in right shoulder: Secondary | ICD-10-CM | POA: Diagnosis not present

## 2019-05-18 DIAGNOSIS — Z79891 Long term (current) use of opiate analgesic: Secondary | ICD-10-CM | POA: Diagnosis not present

## 2019-05-23 ENCOUNTER — Ambulatory Visit
Admission: RE | Admit: 2019-05-23 | Discharge: 2019-05-23 | Disposition: A | Discharge status: Home / Self Care | Payer: Medicare PPO | Source: Ambulatory Visit | Attending: Student | Admitting: Student

## 2019-05-23 ENCOUNTER — Other Ambulatory Visit: Payer: Self-pay

## 2019-05-23 DIAGNOSIS — M25511 Pain in right shoulder: Secondary | ICD-10-CM | POA: Diagnosis not present

## 2019-05-23 DIAGNOSIS — M1611 Unilateral primary osteoarthritis, right hip: Secondary | ICD-10-CM | POA: Diagnosis not present

## 2019-05-23 DIAGNOSIS — M25611 Stiffness of right shoulder, not elsewhere classified: Secondary | ICD-10-CM | POA: Diagnosis not present

## 2019-05-23 DIAGNOSIS — M25551 Pain in right hip: Secondary | ICD-10-CM

## 2019-05-24 DIAGNOSIS — Z4789 Encounter for other orthopedic aftercare: Secondary | ICD-10-CM | POA: Diagnosis not present

## 2019-05-29 DIAGNOSIS — M25611 Stiffness of right shoulder, not elsewhere classified: Secondary | ICD-10-CM | POA: Diagnosis not present

## 2019-05-29 DIAGNOSIS — M25511 Pain in right shoulder: Secondary | ICD-10-CM | POA: Diagnosis not present

## 2019-06-01 ENCOUNTER — Other Ambulatory Visit: Payer: Self-pay

## 2019-06-01 ENCOUNTER — Ambulatory Visit: Payer: Medicare PPO | Admitting: Internal Medicine

## 2019-06-01 ENCOUNTER — Encounter: Payer: Self-pay | Admitting: Internal Medicine

## 2019-06-01 VITALS — BP 126/46 | HR 71 | Temp 97.6°F | Ht 65.0 in | Wt 162.8 lb

## 2019-06-01 DIAGNOSIS — J479 Bronchiectasis, uncomplicated: Secondary | ICD-10-CM | POA: Diagnosis not present

## 2019-06-01 DIAGNOSIS — J849 Interstitial pulmonary disease, unspecified: Secondary | ICD-10-CM

## 2019-06-01 NOTE — Patient Instructions (Signed)
The patient should have follow up scheduled with myself in 6 weeks.   Schedule your CT scan. Send three sputum samples to the [facility] lab for collection.

## 2019-06-01 NOTE — Progress Notes (Signed)
Beverly [name]    LP:[id number]    12-05-1945  Primary Care Physician:Knapp, Tera Helper, MD  Referring Physician: Rita Ohara, [address] [place],  [place] [address] Reason for Consultation: cough, asthma, abnormal CT Date of Consultation: 06/01/2019  Chief complaint:   Chief Complaint  Patient presents with  . Consult    Asthma for years.  past 3 years productive cough with Dr. Tomi Bamberger.  Part clear and part thick brown lump.  Coughing up daily.  CT of lungs right lung Feb 2021. showed ground glass densities.  Would like that explained.     HPI: Beverly [name] is a 74 y.o. woman who presents for new patient evaluation for chronic cough.   She has daily chronic cough for over 3 years with mucus production. She brings up about a tablespoon of mucus everyday usually in the morning and afternoon. Had pneumonia once 3-4 years ago and was treated as an outpatient by PCP with abx for 2 rounds.    She also has questions about her CT scan from her should which demonstrates ground glass opacities.  She denies any fevers chills  or unintentional weight loss.  Occasionally sweats which caused her to change her clothes in the middle of the night.  No nausea vomiting abdominal pain diarrhea.  For asthma: Diagnosed 50 years ago when she moved to [place].  She feels that her asthma symptoms are well controlled.  Current Regimen: flovent 2 puffs twice a day. and prn albuterol  Asthma Triggers: seasonal allergies Exacerbations in the last year: none, last took prednisone last fall for her back. History of hospitalization or intubation: never Allergy Testing: yes many years ago.  GERD: yes, on PPI with omeprazole Allergic Rhinitis: yes, on flonase (astelin makes nose bleed.) ACT:  Asthma Control Test ACT Total Score  06/01/2019 25    Social history: Occupation: Marine scientist for [employer], semi-retired. [employer].  Currently giving Covid vaccines. Smoking history: never smoker, no significant  passive smoke exposure  Social History   Occupational History  . Occupation: STAFF Optician, dispensing: [employer]  Tobacco Use  . Smoking status: Never Smoker  . Smokeless tobacco: Never Used  Substance and Sexual Activity  . Alcohol use: Yes    Alcohol/week: 7.0 standard drinks    Types: 7 Glasses of wine per week    Comment: 1 glass of wine daily  . Drug use: No  . Sexual activity: Not Currently    Relevant family history: Family History  Problem Relation Age of Onset  . Depression Mother   . Schizophrenia Mother   . Cancer Father        oral  . Heart disease Father        bradycardia  . Cancer Sister        skin and lung  . COPD Sister   . Hypertension Sister   . Osteoporosis Sister        compression fx's x 3 11/2018  . Hyperthyroidism Brother   . Cancer Brother        metastatic cancer to bone (?primary)  . Hyperlipidemia Daughter   . Asthma Son   . Cancer Paternal Grandmother 73       colon cancer, metastatic to liver  . Colon cancer Paternal Grandmother   . Diabetes Maternal Grandmother   . ADD / ADHD Other     Past Medical History:  Diagnosis Date  . Allergy   .  Amaurosis fugax    negative w/u through [facility]  . Anxiety   . Asthma   . Back pain    Dr Joline Salt 02/2010-epidural injection x 2 at L4-5 with good effect  . 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 BCC (basal cell carcinoma of skin) 05/09/2014   mid lower back  . BCC (basal cell carcinoma of skin) 05/03/2017   right low back  . BCC (basal cell carcinoma of skin) 05/03/2017   left upper back  . BCC (basal cell carcinoma of skin) 07/05/2018   left mid back  . BCC (basal cell carcinoma of skin) 05/20/1992   upper back  . BCC (basal cell carcinoma of skin) 07/29/1993   left sholder medial  . BCC (basal cell carcinoma of skin) 07/29/1993   left sholder lateral  . BCC (basal cell carcinoma of skin) 07/29/1993   right thigh  . BCC (basal cell carcinoma of  skin) 07/29/1993   right sholder  . BCC (basal cell carcinoma of skin) 12/22/1994   right mid forearm  . BCC (basal cell carcinoma of skin) 12/22/1994   right upper forearm  . BCC (basal cell carcinoma of skin) 12/22/1994   lower right upper arm  . BCC (basal cell carcinoma of skin) 12/22/1994   right upper arm sholder  . BCC (basal cell carcinoma of skin) 08/11/1995   left leg below knee  . BCC (basal cell carcinoma of skin) 04/11/2002   mid back  . BCC (basal cell carcinoma of skin) 12/10/2002   right center upper back  . BCC (basal cell carcinoma of skin) 05/26/2005   right post sholder  . BCC (basal cell carcinoma) 05/09/2014   left inner shin  . BCC (basal cell carcinoma) 06/12/2014   left forearm  . Bowen's disease 10/07/1994   right post knee, right inner forearm/wrist  . Bowen's disease 08/11/1995   left sholder  . Cataract    left  . Chronic pain   . Depression   . Diverticulosis of colon 1998   mild  . DJD (degenerative joint disease)   . Duodenal ulcer    h/o  . GERD (gastroesophageal reflux disease)   . History of SCC (squamous cell carcinoma) of skin    Dr. Denna Haggard  . History of sinus bradycardia   . HLD (hyperlipidemia)    hypertriglyceridemia  . Hypertensive retinopathy of both eyes 11/18/2017  . IBS (irritable bowel syndrome)   . Internal hemorrhoids 1998  . Mitral regurgitation    mild  . Ocular migraine 10/31/14   Dr.Hecker  . Osteoarthritis    feet,shoulder,neck,back,hips and hands.  . Pneumonia   . PONV (postoperative nausea and vomiting)   . Rotator cuff tear, right 02/2019   infraspinatus and supraspinatus, and dislocation of long head of bicep tendon (Dr. Alvan Dame)  . SCC (squamous cell carcinoma) 11/26/2014   left hand, right hand, right deltoid  . SCC (squamous cell carcinoma) 05/03/2017   left cheek  . Sleep apnea    uses a mouth guard nightly  . Squamous cell carcinoma in situ (SCCIS) 07/05/2018   left hand  . Vitamin D deficiency     mild    Past Surgical History:  Procedure Laterality Date  . BLEPHAROPLASTY Bilateral 01/2018  . BROW LIFT Bilateral 10/10/2018   Procedure: BROW LIFT;  Surgeon: Wallace Going, DO;  Location: [facility];  Service: Plastics;  Laterality: Bilateral;  . BUNIONECTOMY     R 12/08, L 2004 (  Dr. Janus Molder)  . CARPAL TUNNEL RELEASE  1989   bilateral  . CATARACT EXTRACTION Bilateral Left in 08/2012, Right 04/2016   Dr.Hecker  . CESAREAN SECTION     x2  . CHOLECYSTECTOMY  1992  . CLOSED REDUCTION NASAL FRACTURE N/A 08/29/2018   Procedure: CLOSED REDUCTION NASAL FRACTURE;  Surgeon: Wallace Going, DO;  Location: [facility];  Service: Plastics;  Laterality: N/A;  1 hour, please  . COLONOSCOPY  2006  . COLONOSCOPY  01/2012   due again 01/2022; mild diverticulosis  . epidural steroid injection, back  02/2010  . HIP SURGERY     right bursectomy x 3  . HIP SURGERY Right 2000   torn cartilage, repaired  . INGUINAL HERNIA REPAIR  9/09   bilat  . NECK SURGERY  1989   c6-7 cervical laminectomy and diskecotmy  . NECK SURGERY  1989   per patient posterior area of neck  . REVERSE SHOULDER ARTHROPLASTY Right 04/11/2019   Procedure: REVERSE SHOULDER ARTHROPLASTY;  Surgeon: Nicholes Stairs, MD;  Location: [facility];  Service: Orthopedics;  Laterality: Right;  Regional Block  . SHOULDER SURGERY  2/05   left rotator cuff repair  . SHOULDER SURGERY Left 03/14/2018   rotator cuff repair; Dr. Tonita Cong  . TONSILLECTOMY  age 41  . TOTAL ABDOMINAL HYSTERECTOMY W/ BILATERAL SALPINGOOPHORECTOMY  1997   fibroids  . TROCHANTERIC BURSA EXCISION Right 1978  . TUBAL LIGATION  1986  . WISDOM TOOTH EXTRACTION       Review of systems: Review of Systems  Constitutional: Negative for chills, fever and weight loss.  HENT: Positive for congestion. Negative for sinus pain and sore throat.   Eyes: Negative for discharge and redness.  Respiratory: Positive for cough, sputum  production and shortness of breath. Negative for hemoptysis and wheezing.   Cardiovascular: Negative for chest pain, palpitations and leg swelling.  Gastrointestinal: Positive for heartburn. Negative for nausea and vomiting.  Musculoskeletal: Negative for joint pain and myalgias.  Skin: Negative for rash.  Neurological: Negative for dizziness, tremors, focal weakness and headaches.  Endo/Heme/Allergies: Negative for environmental allergies.  Psychiatric/Behavioral: Negative for depression. The patient is not nervous/anxious.   All other systems reviewed and are negative.   Physical Exam: Blood pressure (!) 126/46, pulse 71, temperature 97.6 F (36.4 C), temperature source Temporal, height 5\' 5"  (1.651 m), weight 162 lb 12.8 oz (73.8 kg), SpO2 96 %. Gen:      No acute distress ENT:  no nasal polyps, mucus membranes moist, nasal erythema, mild nasal debris Lungs:    No increased respiratory effort, symmetric chest wall excursion, clear to auscultation bilaterally, no wheezes or crackles CV:         Regular rate and rhythm; no murmurs, rubs, or gallops.  No pedal edema Abd:      + bowel sounds; soft, non-tender; no distension MSK: no acute synovitis of DIP or PIP joints, no mechanics hands.  Skin:      Warm and dry; no rashes Neuro: normal speech, no focal facial asymmetry Psych: alert and oriented x3, normal mood and affect   Data Reviewed/Medical Decision Making:  Independent interpretation of tests: Imaging: . Review of patient's CT shoulder demonstrates in the right lung that there is middle and lower lobe predominant bronchiectasis with mild traction bronchiectasis, with some groundglass opacities The patient's images have been independently reviewed by me.    PFTs:  None on file Labs:  Lab Results  Component Value Date   WBC  9.9 04/07/2019   HGB 11.5 (L) 04/12/2019   HCT 35.0 (L) 04/12/2019   MCV 104.6 (H) 04/07/2019   PLT 200 04/07/2019   Lab Results  Component Value  Date   NA 141 04/12/2019   K 4.1 04/12/2019   CL 107 04/12/2019   CO2 24 04/12/2019     Immunization status:  Immunization History  Administered Date(s) Administered  . Hepatitis A 06/17/2006, 01/05/2007  . IPV 04/15/2012  . Influenza Split 10/31/2013  . Influenza, High Dose Seasonal PF 11/16/2016, 11/16/2017, 11/01/2018  . Influenza,inj,quad, With Preservative 11/16/2017  . Influenza-Unspecified 11/09/2014, 11/14/2015  . PFIZER SARS-COV-2 Vaccination 02/05/2019, 02/25/2019  . Pneumococcal Conjugate-13 07/20/2013  . Pneumococcal Polysaccharide-23 06/17/2006, 11/26/2014  . Tdap 09/16/2004, 10/08/2014  . Typhoid Live 06/17/2011, 06/10/2017  . Yellow Fever 07/08/2012  . Zoster 04/04/2009  . Zoster Recombinat (Shingrix) 04/28/2016, 08/15/2016    . I reviewed prior external note(s) from Dr. Tomi Bamberger . I reviewed the result(s) of the labs and imaging as noted above.  . I have ordered sputum cultures, CT Chest  Assessment:  Mild persistent asthma, well controlled Bronchiectasis Abnormal CT chest   Plan/Recommendations: We will obtain a dedicated CT chest to further characterize the nature of her lung disease.  ILD is a possibility however this bronchiectasis may be secondary to recurrent mucus impaction from infections.  Cannot rule out atypical infection such as Mycobacterium.  We will send AFB sputum cultures x3 as well as regular respiratory sputum culture.  I will have her follow-up in 6 weeks to discuss the results.  Continue current asthma management.  Return to Care: Return in about 6 weeks (around 07/13/2019).  Lenice Llamas, MD [facility] [place]  CC: Rita Ohara, MD

## 2019-06-02 ENCOUNTER — Other Ambulatory Visit: Payer: Medicare PPO

## 2019-06-02 DIAGNOSIS — J479 Bronchiectasis, uncomplicated: Secondary | ICD-10-CM

## 2019-06-05 LAB — RESPIRATORY CULTURE OR RESPIRATORY AND SPUTUM CULTURE
MICRO NUMBER:: 10372940
RESULT:: NORMAL
SPECIMEN QUALITY:: ADEQUATE

## 2019-06-06 ENCOUNTER — Other Ambulatory Visit: Payer: Medicare PPO

## 2019-06-06 DIAGNOSIS — J479 Bronchiectasis, uncomplicated: Secondary | ICD-10-CM

## 2019-06-07 ENCOUNTER — Telehealth: Payer: Self-pay | Admitting: Internal Medicine

## 2019-06-07 ENCOUNTER — Other Ambulatory Visit: Payer: Medicare PPO

## 2019-06-07 NOTE — Telephone Encounter (Signed)
Fine with me

## 2019-06-07 NOTE — Telephone Encounter (Signed)
Pt would like to TOC from Dr. Tomi Bamberger to Dr. Jerilee Hoh. Pt states this location is more convenient for her.    Pt can be reached at [phone]

## 2019-06-07 NOTE — Telephone Encounter (Signed)
Noted. If switching PCP to your clinic, please cancel her upcoming scheduled visits at my office. I understand her reasons, and hope you enjoy her as much as I do!

## 2019-06-08 ENCOUNTER — Other Ambulatory Visit: Payer: Medicare PPO

## 2019-06-08 DIAGNOSIS — M25611 Stiffness of right shoulder, not elsewhere classified: Secondary | ICD-10-CM | POA: Diagnosis not present

## 2019-06-08 DIAGNOSIS — J479 Bronchiectasis, uncomplicated: Secondary | ICD-10-CM | POA: Diagnosis not present

## 2019-06-08 DIAGNOSIS — M25511 Pain in right shoulder: Secondary | ICD-10-CM | POA: Diagnosis not present

## 2019-06-08 NOTE — Telephone Encounter (Signed)
LVM to set up TOC with Jerilee Hoh

## 2019-06-13 DIAGNOSIS — M25511 Pain in right shoulder: Secondary | ICD-10-CM | POA: Diagnosis not present

## 2019-06-13 DIAGNOSIS — M25611 Stiffness of right shoulder, not elsewhere classified: Secondary | ICD-10-CM | POA: Diagnosis not present

## 2019-06-14 ENCOUNTER — Other Ambulatory Visit: Payer: Self-pay | Admitting: Family Medicine

## 2019-06-14 DIAGNOSIS — K219 Gastro-esophageal reflux disease without esophagitis: Secondary | ICD-10-CM

## 2019-06-15 ENCOUNTER — Encounter: Payer: Self-pay | Admitting: Family Medicine

## 2019-06-15 DIAGNOSIS — M25511 Pain in right shoulder: Secondary | ICD-10-CM | POA: Diagnosis not present

## 2019-06-15 DIAGNOSIS — M25611 Stiffness of right shoulder, not elsewhere classified: Secondary | ICD-10-CM | POA: Diagnosis not present

## 2019-06-15 MED ORDER — ESOMEPRAZOLE MAGNESIUM 40 MG PO CPDR: DELAYED_RELEASE_CAPSULE | ORAL | 0 refills | Status: DC

## 2019-06-15 MED FILL — FLOVENT HFA 220 MCG INHALER: 220 | 90 days supply | Qty: 36 | Fill #2

## 2019-06-15 MED FILL — FLUTICASONE PROP 50 MCG SPR: 50 | 90 days supply | Qty: 48 | Fill #2

## 2019-06-15 NOTE — Telephone Encounter (Signed)
Scheduled appt with patient

## 2019-06-16 DIAGNOSIS — M7061 Trochanteric bursitis, right hip: Secondary | ICD-10-CM | POA: Diagnosis not present

## 2019-06-16 DIAGNOSIS — M25551 Pain in right hip: Secondary | ICD-10-CM | POA: Diagnosis not present

## 2019-06-17 MED FILL — ESOMEPRAZOLE MAG DR 40 MG C: 40 | 90 days supply | Qty: 90 | Fill #0

## 2019-06-20 ENCOUNTER — Other Ambulatory Visit: Payer: Self-pay

## 2019-06-20 ENCOUNTER — Ambulatory Visit
Admission: RE | Admit: 2019-06-20 | Discharge: 2019-06-20 | Disposition: A | Discharge status: Home / Self Care | Payer: Medicare PPO | Source: Ambulatory Visit | Attending: Internal Medicine | Admitting: Internal Medicine

## 2019-06-20 DIAGNOSIS — M25511 Pain in right shoulder: Secondary | ICD-10-CM | POA: Diagnosis not present

## 2019-06-20 DIAGNOSIS — J398 Other specified diseases of upper respiratory tract: Secondary | ICD-10-CM | POA: Diagnosis not present

## 2019-06-20 DIAGNOSIS — M25611 Stiffness of right shoulder, not elsewhere classified: Secondary | ICD-10-CM | POA: Diagnosis not present

## 2019-06-20 DIAGNOSIS — J849 Interstitial pulmonary disease, unspecified: Secondary | ICD-10-CM

## 2019-06-22 ENCOUNTER — Other Ambulatory Visit: Payer: Self-pay

## 2019-06-22 ENCOUNTER — Encounter (HOSPITAL_COMMUNITY): Payer: Self-pay | Admitting: Emergency Medicine

## 2019-06-22 ENCOUNTER — Observation Stay (HOSPITAL_COMMUNITY)
Admission: EM | Admit: 2019-06-22 | Discharge: 2019-06-24 | Disposition: A | Discharge status: Home / Self Care | Payer: Medicare PPO | Attending: Internal Medicine | Admitting: Internal Medicine

## 2019-06-22 ENCOUNTER — Emergency Department (HOSPITAL_COMMUNITY): Payer: Medicare PPO

## 2019-06-22 DIAGNOSIS — F1190 Opioid use, unspecified, uncomplicated: Secondary | ICD-10-CM

## 2019-06-22 DIAGNOSIS — R0789 Other chest pain: Secondary | ICD-10-CM | POA: Diagnosis not present

## 2019-06-22 DIAGNOSIS — G4733 Obstructive sleep apnea (adult) (pediatric): Secondary | ICD-10-CM | POA: Diagnosis not present

## 2019-06-22 DIAGNOSIS — Z79899 Other long term (current) drug therapy: Secondary | ICD-10-CM | POA: Diagnosis not present

## 2019-06-22 DIAGNOSIS — J849 Interstitial pulmonary disease, unspecified: Secondary | ICD-10-CM | POA: Insufficient documentation

## 2019-06-22 DIAGNOSIS — R079 Chest pain, unspecified: Principal | ICD-10-CM | POA: Insufficient documentation

## 2019-06-22 DIAGNOSIS — F419 Anxiety disorder, unspecified: Secondary | ICD-10-CM | POA: Diagnosis not present

## 2019-06-22 DIAGNOSIS — Z7951 Long term (current) use of inhaled steroids: Secondary | ICD-10-CM | POA: Diagnosis not present

## 2019-06-22 DIAGNOSIS — Z888 Allergy status to other drugs, medicaments and biological substances status: Secondary | ICD-10-CM | POA: Diagnosis not present

## 2019-06-22 DIAGNOSIS — Z885 Allergy status to narcotic agent status: Secondary | ICD-10-CM | POA: Diagnosis not present

## 2019-06-22 DIAGNOSIS — Z96611 Presence of right artificial shoulder joint: Secondary | ICD-10-CM | POA: Diagnosis not present

## 2019-06-22 DIAGNOSIS — J3089 Other allergic rhinitis: Secondary | ICD-10-CM

## 2019-06-22 DIAGNOSIS — F329 Major depressive disorder, single episode, unspecified: Secondary | ICD-10-CM | POA: Diagnosis not present

## 2019-06-22 DIAGNOSIS — M1990 Unspecified osteoarthritis, unspecified site: Secondary | ICD-10-CM | POA: Diagnosis not present

## 2019-06-22 DIAGNOSIS — Z791 Long term (current) use of non-steroidal anti-inflammatories (NSAID): Secondary | ICD-10-CM | POA: Diagnosis not present

## 2019-06-22 DIAGNOSIS — Z7982 Long term (current) use of aspirin: Secondary | ICD-10-CM | POA: Insufficient documentation

## 2019-06-22 DIAGNOSIS — K219 Gastro-esophageal reflux disease without esophagitis: Secondary | ICD-10-CM | POA: Insufficient documentation

## 2019-06-22 DIAGNOSIS — G8929 Other chronic pain: Secondary | ICD-10-CM | POA: Insufficient documentation

## 2019-06-22 DIAGNOSIS — Z20822 Contact with and (suspected) exposure to covid-19: Secondary | ICD-10-CM | POA: Insufficient documentation

## 2019-06-22 DIAGNOSIS — Z85828 Personal history of other malignant neoplasm of skin: Secondary | ICD-10-CM | POA: Insufficient documentation

## 2019-06-22 DIAGNOSIS — E782 Mixed hyperlipidemia: Secondary | ICD-10-CM | POA: Diagnosis not present

## 2019-06-22 DIAGNOSIS — J45909 Unspecified asthma, uncomplicated: Secondary | ICD-10-CM | POA: Diagnosis not present

## 2019-06-22 LAB — BASIC METABOLIC PANEL
Anion gap: 8 (ref 5–15)
BUN: 15 mg/dL (ref 8–23)
CO2: 28 mmol/L (ref 22–32)
Calcium: 9.7 mg/dL (ref 8.9–10.3)
Chloride: 106 mmol/L (ref 98–111)
Creatinine, Ser: 0.69 mg/dL (ref 0.44–1.00)
GFR calc Af Amer: >60 mL/min (ref >60)
GFR calc non Af Amer: >60 mL/min (ref >60)
Glucose, Bld: 94 mg/dL (ref 70–99)
Potassium: 3.9 mmol/L (ref 3.5–5.1)
Sodium: 142 mmol/L (ref 135–145)

## 2019-06-22 LAB — CBC
HCT: 44.8 % (ref 36.0–46.0)
Hemoglobin: 14.1 g/dL (ref 12.0–15.0)
MCH: 32.1 pg (ref 26.0–34.0)
MCHC: 31.5 g/dL (ref 30.0–36.0)
MCV: 102.1 fL — ABNORMAL HIGH (ref 80.0–100.0)
Platelets: 208 10*3/uL (ref 150–400)
RBC: 4.39 MIL/uL (ref 3.87–5.11)
RDW: 13.9 % (ref 11.5–15.5)
WBC: 10 10*3/uL (ref 4.0–10.5)
nRBC: 0 % (ref 0.0–0.2)

## 2019-06-22 LAB — RESPIRATORY PANEL BY RT PCR (FLU A&B, COVID)
Influenza A by PCR: NEGATIVE
Influenza B by PCR: NEGATIVE
SARS Coronavirus 2 by RT PCR: NEGATIVE

## 2019-06-22 LAB — LIPID PANEL
Cholesterol: 161 mg/dL (ref 0–200)
HDL: 49 mg/dL (ref >40)
LDL Cholesterol: 73 mg/dL (ref 0–99)
Total CHOL/HDL Ratio: 3.3 RATIO
Triglycerides: 197 mg/dL — ABNORMAL HIGH (ref <150)
VLDL: 39 mg/dL (ref 0–40)

## 2019-06-22 LAB — TROPONIN I (HIGH SENSITIVITY)
Troponin I (High Sensitivity): 4 ng/L (ref <18)
Troponin I (High Sensitivity): 6 ng/L (ref <18)

## 2019-06-22 MED ORDER — ASPIRIN 81 MG PO CHEW
324.0000 mg | CHEWABLE_TABLET | Freq: Once | ORAL | Status: AC
  Administered 2019-06-22: 324 mg via ORAL
  Filled 2019-06-22: qty 4

## 2019-06-22 MED ORDER — ACETAMINOPHEN 325 MG PO TABS
650.0000 mg | ORAL_TABLET | ORAL | Status: DC | PRN
  Administered 2019-06-24: 11:00:00 650 mg via ORAL
  Filled 2019-06-22: qty 2

## 2019-06-22 MED ORDER — GUAIFENESIN ER 600 MG PO TB12
600.0000 mg | ORAL_TABLET | Freq: Two times a day (BID) | ORAL | Status: DC
  Administered 2019-06-22 – 2019-06-24 (×4): 600 mg via ORAL
  Filled 2019-06-22 (×4): qty 1

## 2019-06-22 MED ORDER — GABAPENTIN 400 MG PO CAPS
800.0000 mg | ORAL_CAPSULE | Freq: Every day | ORAL | Status: DC
  Administered 2019-06-22 – 2019-06-23 (×2): 800 mg via ORAL
  Filled 2019-06-22 (×2): qty 2

## 2019-06-22 MED ORDER — PANTOPRAZOLE SODIUM 40 MG PO TBEC
40.0000 mg | DELAYED_RELEASE_TABLET | Freq: Every day | ORAL | Status: DC
  Administered 2019-06-22 – 2019-06-23 (×2): 40 mg via ORAL
  Filled 2019-06-22 (×2): qty 1

## 2019-06-22 MED ORDER — METHOCARBAMOL 500 MG PO TABS: 500.0000 mg | ORAL_TABLET | Freq: Four times a day (QID) | ORAL | Status: DC | PRN

## 2019-06-22 MED ORDER — LORATADINE 10 MG PO TABS
10.0000 mg | ORAL_TABLET | Freq: Every day | ORAL | Status: DC
  Administered 2019-06-22 – 2019-06-24 (×3): 10 mg via ORAL
  Filled 2019-06-22 (×3): qty 1

## 2019-06-22 MED ORDER — DICLOFENAC SODIUM 1 % EX GEL
2.0000 g | Freq: Four times a day (QID) | CUTANEOUS | Status: DC | PRN
  Filled 2019-06-22: qty 100

## 2019-06-22 MED ORDER — PAROXETINE HCL 20 MG PO TABS
60.0000 mg | ORAL_TABLET | Freq: Every day | ORAL | Status: DC
  Administered 2019-06-23 – 2019-06-24 (×2): 60 mg via ORAL
  Filled 2019-06-22: qty 2
  Filled 2019-06-22 (×2): qty 3

## 2019-06-22 MED ORDER — ASPIRIN EC 81 MG PO TBEC
81.0000 mg | DELAYED_RELEASE_TABLET | Freq: Every day | ORAL | Status: DC
  Administered 2019-06-23 – 2019-06-24 (×2): 81 mg via ORAL
  Filled 2019-06-22 (×2): qty 1

## 2019-06-22 MED ORDER — ATORVASTATIN CALCIUM 40 MG PO TABS: 40.0000 mg | ORAL_TABLET | Freq: Every day | ORAL | Status: DC

## 2019-06-22 MED ORDER — ONDANSETRON HCL 4 MG/2ML IJ SOLN: 4.0000 mg | Freq: Four times a day (QID) | INTRAMUSCULAR | Status: DC | PRN

## 2019-06-22 MED ORDER — MONTELUKAST SODIUM 10 MG PO TABS
10.0000 mg | ORAL_TABLET | Freq: Every day | ORAL | Status: DC
  Administered 2019-06-22 – 2019-06-23 (×2): 10 mg via ORAL
  Filled 2019-06-22 (×3): qty 1

## 2019-06-22 MED ORDER — FLUTICASONE PROPIONATE 50 MCG/ACT NA SUSP
2.0000 | Freq: Two times a day (BID) | NASAL | Status: DC
  Administered 2019-06-22 – 2019-06-24 (×4): 2 via NASAL
  Filled 2019-06-22 (×2): qty 16

## 2019-06-22 MED ORDER — LOPERAMIDE HCL 2 MG PO CAPS: 2.0000 mg | ORAL_CAPSULE | Freq: Four times a day (QID) | ORAL | Status: DC | PRN

## 2019-06-22 MED ORDER — SODIUM CHLORIDE 0.9% FLUSH: 3.0000 mL | Freq: Once | INTRAVENOUS | Status: DC

## 2019-06-22 MED ORDER — NIACINAMIDE 500 MG PO TABS: 500.0000 mg | ORAL_TABLET | Freq: Two times a day (BID) | ORAL | Status: DC

## 2019-06-22 MED ORDER — ATORVASTATIN CALCIUM 40 MG PO TABS
40.0000 mg | ORAL_TABLET | Freq: Every day | ORAL | Status: DC
  Administered 2019-06-22 – 2019-06-23 (×2): 40 mg via ORAL
  Filled 2019-06-22 (×2): qty 1

## 2019-06-22 MED ORDER — FENTANYL 25 MCG/HR TD PT72
1.0000 | MEDICATED_PATCH | TRANSDERMAL | Status: DC
  Administered 2019-06-23: 11:00:00 1 via TRANSDERMAL
  Filled 2019-06-22: qty 1

## 2019-06-22 MED ORDER — ENOXAPARIN SODIUM 40 MG/0.4ML ~~LOC~~ SOLN
40.0000 mg | SUBCUTANEOUS | Status: DC
  Administered 2019-06-22 – 2019-06-24 (×3): 40 mg via SUBCUTANEOUS
  Filled 2019-06-22 (×3): qty 0.4

## 2019-06-22 MED ORDER — HYDROCODONE-ACETAMINOPHEN 7.5-325 MG PO TABS: 1.0000 | ORAL_TABLET | Freq: Two times a day (BID) | ORAL | Status: DC | PRN

## 2019-06-22 NOTE — Progress Notes (Signed)
Patient arrived to unit in NAD, VS stable and patient free from pain. Patient oriented to room.  

## 2019-06-22 NOTE — H&P (Addendum)
History and Physical    Beverly [name] [id number] DOB: 1945-11-24 DOA: 06/22/2019  Referring MD/NP/PA: Sherwood Gambler, MD PCP: Rita Ohara, MD  Patient coming from: Work  Chief Complaint: Chest pain  I have personally briefly reviewed patient's old medical records in [place]   HPI: Beverly [name] is a 74 y.o. female with medical history significant of HLD, interstitial lung disease, amaurosis fugax with negative work-up, anxiety, depression, and chronic pain who presented with complaints of chest pain.  She was sitting down at the time when she reported having acute onset of substernal pressure-like pain that thereafter radiated up the right side of her neck into her jaw.  Notes that she has never had a pain like this before.  She reports that she may have felt a little short of breath.  Denied having any diaphoresis, nausea, vomiting, abdominal pain, lightheadedness, calf pain, or palpitations.  She has lower extremity swelling, cough, and intermittent shortness of breath that she reports is more so chronic.  Records note a low-grade stress test performed last in 2018.  Her last echocardiogram from 01/2015 noted EF of 55 to 60%.  Her pulmonologist Dr. Shearon Stalls had checked a CT of her chest due to history of chronic cough and asthma just 2 days ago.  ED Course: Upon admission into the emergency department patient was noted to be afebrile, pulse 52-65, blood pressures elevated up to 160/69, and all other vital signs maintained.  Labs were unremarkable including initial troponin negative.  Chest x-ray noted no acute abnormality.  Patient was given full dose aspirin. [facility] called to admit.  Review of Systems  Constitutional: Negative for diaphoresis and fever.       No significant change in weight  HENT: Negative for congestion and nosebleeds.   Eyes: Negative for photophobia and pain.  Respiratory: Positive for cough (Chronic) and shortness of breath (On exertion).   Cardiovascular:  Positive for chest pain and leg swelling. Negative for palpitations.  Gastrointestinal: Negative for abdominal pain, nausea and vomiting.  Genitourinary: Negative for dysuria and hematuria.  Musculoskeletal: Positive for back pain. Negative for falls.  Skin: Negative for rash.  Neurological: Negative for focal weakness and loss of consciousness.  Endo/Heme/Allergies: Negative for polydipsia.  Psychiatric/Behavioral: Negative for memory loss and substance abuse.     Past Medical History:  Diagnosis Date  . Allergy   . Amaurosis fugax    negative w/u through [facility]  . Anxiety   . Asthma   . Back pain    Dr Joline Salt 02/2010-epidural injection x 2 at L4-5 with good effect  . 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 BCC (basal cell carcinoma of skin) 05/09/2014   mid lower back  . BCC (basal cell carcinoma of skin) 05/03/2017   right low back  . BCC (basal cell carcinoma of skin) 05/03/2017   left upper back  . BCC (basal cell carcinoma of skin) 07/05/2018   left mid back  . BCC (basal cell carcinoma of skin) 05/20/1992   upper back  . BCC (basal cell carcinoma of skin) 07/29/1993   left sholder medial  . BCC (basal cell carcinoma of skin) 07/29/1993   left sholder lateral  . BCC (basal cell carcinoma of skin) 07/29/1993   right thigh  . BCC (basal cell carcinoma of skin) 07/29/1993   right sholder  . BCC (basal cell carcinoma of skin) 12/22/1994   right mid forearm  . BCC (basal cell carcinoma of skin) 12/22/1994  right upper forearm  . BCC (basal cell carcinoma of skin) 12/22/1994   lower right upper arm  . BCC (basal cell carcinoma of skin) 12/22/1994   right upper arm sholder  . BCC (basal cell carcinoma of skin) 08/11/1995   left leg below knee  . BCC (basal cell carcinoma of skin) 04/11/2002   mid back  . BCC (basal cell carcinoma of skin) 12/10/2002   right center upper back  . BCC (basal cell carcinoma of skin) 05/26/2005   right post sholder   . BCC (basal cell carcinoma) 05/09/2014   left inner shin  . BCC (basal cell carcinoma) 06/12/2014   left forearm  . Bowen's disease 10/07/1994   right post knee, right inner forearm/wrist  . Bowen's disease 08/11/1995   left sholder  . Cataract    left  . Chronic pain   . Depression   . Diverticulosis of colon 1998   mild  . DJD (degenerative joint disease)   . Duodenal ulcer    h/o  . GERD (gastroesophageal reflux disease)   . History of SCC (squamous cell carcinoma) of skin    Dr. Denna Haggard  . History of sinus bradycardia   . HLD (hyperlipidemia)    hypertriglyceridemia  . Hypertensive retinopathy of both eyes 11/18/2017  . IBS (irritable bowel syndrome)   . Internal hemorrhoids 1998  . Mitral regurgitation    mild  . Ocular migraine 10/31/14   Dr.Hecker  . Osteoarthritis    feet,shoulder,neck,back,hips and hands.  . Pneumonia   . PONV (postoperative nausea and vomiting)   . Rotator cuff tear, right 02/2019   infraspinatus and supraspinatus, and dislocation of long head of bicep tendon (Dr. Alvan Dame)  . SCC (squamous cell carcinoma) 11/26/2014   left hand, right hand, right deltoid  . SCC (squamous cell carcinoma) 05/03/2017   left cheek  . Sleep apnea    uses a mouth guard nightly  . Squamous cell carcinoma in situ (SCCIS) 07/05/2018   left hand  . Vitamin D deficiency    mild    Past Surgical History:  Procedure Laterality Date  . BLEPHAROPLASTY Bilateral 01/2018  . BROW LIFT Bilateral 10/10/2018   Procedure: BROW LIFT;  Surgeon: Wallace Going, DO;  Location: [place];  Service: Plastics;  Laterality: Bilateral;  . BUNIONECTOMY     R 12/08, L 2004 (Dr. Janus Molder)  . CARPAL TUNNEL RELEASE  1989   bilateral  . CATARACT EXTRACTION Bilateral Left in 08/2012, Right 04/2016   Dr.Hecker  . CESAREAN SECTION     x2  . CHOLECYSTECTOMY  1992  . CLOSED REDUCTION NASAL FRACTURE N/A 08/29/2018   Procedure: CLOSED REDUCTION NASAL FRACTURE;  Surgeon:  Wallace Going, DO;  Location: [place];  Service: Plastics;  Laterality: N/A;  1 hour, please  . COLONOSCOPY  2006  . COLONOSCOPY  01/2012   due again 01/2022; mild diverticulosis  . epidural steroid injection, back  02/2010  . HIP SURGERY     right bursectomy x 3  . HIP SURGERY Right 2000   torn cartilage, repaired  . INGUINAL HERNIA REPAIR  9/09   bilat  . NECK SURGERY  1989   c6-7 cervical laminectomy and diskecotmy  . NECK SURGERY  1989   per patient posterior area of neck  . REVERSE SHOULDER ARTHROPLASTY Right 04/11/2019   Procedure: REVERSE SHOULDER ARTHROPLASTY;  Surgeon: Nicholes Stairs, MD;  Location: [place];  Service: Orthopedics;  Laterality: Right;  Regional  Block  . SHOULDER SURGERY  2/05   left rotator cuff repair  . SHOULDER SURGERY Left 03/14/2018   rotator cuff repair; Dr. Tonita Cong  . TONSILLECTOMY  age 58  . TOTAL ABDOMINAL HYSTERECTOMY W/ BILATERAL SALPINGOOPHORECTOMY  1997   fibroids  . TROCHANTERIC BURSA EXCISION Right 1978  . TUBAL LIGATION  1986  . WISDOM TOOTH EXTRACTION       reports that she has never smoked. She has never used smokeless tobacco. She reports current alcohol use of about 7.0 standard drinks of alcohol per week. She reports that she does not use drugs.  Allergies  Allergen Reactions  . Adhesive [Tape] Rash  . Codeine Rash    Family History  Problem Relation Age of Onset  . Depression Mother   . Schizophrenia Mother   . Cancer Father        oral  . Heart disease Father        bradycardia  . Cancer Sister        skin and lung  . COPD Sister   . Hypertension Sister   . Osteoporosis Sister        compression fx's x 3 11/2018  . Hyperthyroidism Brother   . Cancer Brother        metastatic cancer to bone (?primary)  . Hyperlipidemia Daughter   . Asthma Son   . Cancer Paternal Grandmother 56       colon cancer, metastatic to liver  . Colon cancer Paternal Grandmother   . Diabetes Maternal Grandmother    . ADD / ADHD Other     Prior to Admission medications   Medication Sig Start Date End Date Taking? Authorizing Provider  acetaminophen (TYLENOL) 500 MG tablet Take 1,000 mg by mouth every 6 (six) hours as needed for moderate pain.    Yes [provider]  albuterol (PROVENTIL HFA;VENTOLIN HFA) 108 (90 Base) MCG/ACT inhaler Inhale 2 puffs into the lungs every 6 (six) hours as needed for wheezing or shortness of breath. 07/02/16  Yes Rita Ohara, MD  amoxicillin (AMOXIL) 500 MG tablet Take 2,000 mg by mouth as directed. For dental appointments   Yes [provider]  aspirin EC 81 MG tablet Take 81 mg by mouth daily.   Yes [provider]  atorvastatin (LIPITOR) 40 MG tablet Take 1 tablet (40 mg total) by mouth daily. 01/16/19  Yes Rita Ohara, MD  Calcium Carbonate-Vitamin D (CALCIUM 600+D) 600-200 MG-UNIT TABS Take 1 tablet by mouth 2 (two) times daily.     Yes [provider]  celecoxib (CELEBREX) 200 MG capsule Take 1 capsule (200 mg total) by mouth 2 (two) times daily. 01/16/19  Yes Rita Ohara, MD  cetirizine (ZYRTEC) 10 MG tablet Take 10 mg by mouth daily.   Yes [provider]  diclofenac Sodium (VOLTAREN) 1 % GEL Apply 2 g topically 4 (four) times daily as needed (joint pain).  12/29/18  Yes [provider]  esomeprazole (NEXIUM) 40 MG capsule TAKE 1 CAPSULE BY MOUTH DAILY BEFORE SUPPER. 06/15/19  Yes Rita Ohara, MD  fentaNYL (DURAGESIC - DOSED MCG/HR) 25 MCG/HR Place 1 patch onto the skin every 3 (three) days.     Yes [provider]  fluticasone (FLONASE) 50 MCG/ACT nasal spray PLACE 2 SPRAYS INTO THE NOSE DAILY. Patient taking differently: Place 2 sprays into both nostrils in the morning and at bedtime.  07/13/18  Yes Rita Ohara, MD  fluticasone (FLOVENT HFA) 220 MCG/ACT inhaler INHALE 2 PUFFS INTO THE  LUNGS 2 (TWO) TIMES DAILY. Patient taking differently: Inhale 2 puffs into the lungs in the morning and at bedtime.  07/13/18  Yes  Rita Ohara, MD  gabapentin (NEURONTIN) 800 MG tablet Take 800 mg by mouth at bedtime.    Yes [provider]  GUAIFENESIN ER PO Take 400 mg by mouth 2 (two) times daily.    Yes [provider]  HYDROcodone-acetaminophen (NORCO) 7.5-325 MG tablet Take 1 tablet by mouth every 12 (twelve) hours as needed for moderate pain.  10/06/18  Yes [provider]  loperamide (IMODIUM A-D) 2 MG tablet Take 2 mg by mouth 4 (four) times daily as needed for diarrhea or loose stools.   Yes [provider]  methocarbamol (ROBAXIN) 500 MG tablet Take 500 mg by mouth in the morning and at bedtime.    Yes [provider]  montelukast (SINGULAIR) 10 MG tablet Take 1 tablet (10 mg total) by mouth at bedtime. 07/13/18  Yes Rita Ohara, MD  Multiple Vitamins-Minerals (MULTIVITAMIN WITH MINERALS) tablet Take 1 tablet by mouth daily.   Yes [provider]  niacinamide 500 MG tablet Take 500 mg by mouth 2 (two) times daily with a meal.   Yes [provider]  omega-3 acid ethyl esters (LOVAZA) 1 g capsule TAKE 2 CAPSULES BY MOUTH 2 TIMES DAILY. Patient taking differently: Take 2 g by mouth 2 (two) times daily.  03/03/19  Yes Rita Ohara, MD  PARoxetine (PAXIL) 30 MG tablet Take 60 mg by mouth daily.  07/17/14  Yes [provider]  Azelastine HCl 137 MCG/SPRAY SOLN Place 2 Pump into both nostrils 2 (two) times a day. Patient not taking: Reported on 06/22/2019 07/13/18   Rita Ohara, MD  estradiol (ESTRACE) 0.5 MG tablet TAKE 1 TABLET BY MOUTH ONCE DAILY Patient not taking: No sig reported 02/14/18   Rita Ohara, MD  Fluorouracil (TOLAK) 4 % CREA Apply 1 application topically every 3 (three) days.     [provider]  loratadine (CLARITIN) 10 MG tablet Take 10 mg by mouth daily.    [provider]    Physical Exam:  Constitutional: Elderly female appears to be in no acute distress at this time Vitals:   06/22/19 1045 06/22/19 1100 06/22/19 1110  06/22/19 1115  BP: 136/67 139/72  (!) 152/70  Pulse: (!) 54 (!) 52  (!) 57  Resp: 12 12  12   Temp:      TempSrc:      SpO2: 94% 94%  98%  Weight:   73.8 kg   Height:   5\' 5"  (1.651 m)    Eyes: PERRL, lids and conjunctivae normal ENMT: Mucous membranes are moist. Posterior pharynx clear of any exudate or lesions.  Neck: normal, supple, no masses, no thyromegaly Respiratory: Decreased aeration with intermittent coarse breath sounds noted.  No significant wheezes appreciated. Cardiovascular: Regular rate and rhythm, no murmurs / rubs / gallops.  Trace lower extremity edema. 2+ pedal pulses. No carotid bruits.  Abdomen: no tenderness, no masses palpated. No hepatosplenomegaly. Bowel sounds positive.  Musculoskeletal: no clubbing / cyanosis. No joint deformity upper and lower extremities. Good ROM, no contractures. Normal muscle tone.  Skin: no rashes, lesions, ulcers. No induration Neurologic: CN 2-12 grossly intact. Sensation intact, DTR normal. Strength 5/5 in all 4.  Psychiatric: Normal judgment and insight. Alert and oriented x 3. Normal mood.     Labs on Admission: I have personally reviewed following labs and imaging studies  CBC: Recent Labs  Lab 06/22/19 0917  WBC 10.0  HGB 14.1  HCT 44.8  MCV 102.1*  PLT 123XX123   Basic Metabolic Panel: Recent Labs  Lab 06/22/19 0917  NA 142  K 3.9  CL 106  CO2 28  GLUCOSE 94  BUN 15  CREATININE 0.69  CALCIUM 9.7   GFR: Estimated Creatinine Clearance: 64.9 mL/min (by C-G formula based on SCr of 0.69 mg/dL). Liver Function Tests: No results for input(s): AST, ALT, ALKPHOS, BILITOT, PROT, ALBUMIN in the last 168 hours. No results for input(s): LIPASE, AMYLASE in the last 168 hours. No results for input(s): AMMONIA in the last 168 hours. Coagulation Profile: No results for input(s): INR, PROTIME in the last 168 hours. Cardiac Enzymes: No results for input(s): CKTOTAL, CKMB, CKMBINDEX, TROPONINI in the last 168 hours. BNP (last  3 results) No results for input(s): PROBNP in the last 8760 hours. HbA1C: No results for input(s): HGBA1C in the last 72 hours. CBG: No results for input(s): GLUCAP in the last 168 hours. Lipid Profile: No results for input(s): CHOL, HDL, LDLCALC, TRIG, CHOLHDL, LDLDIRECT in the last 72 hours. Thyroid Function Tests: No results for input(s): TSH, T4TOTAL, FREET4, T3FREE, THYROIDAB in the last 72 hours. Anemia Panel: No results for input(s): VITAMINB12, FOLATE, FERRITIN, TIBC, IRON, RETICCTPCT in the last 72 hours. Urine analysis:    Component Value Date/Time   LABSPEC 1.020 06/10/2017 1446   BILIRUBINUR negative 06/10/2017 1446   BILIRUBINUR 2+ 03/12/2016 0856   KETONESUR negative 06/10/2017 1446   PROTEINUR negative 06/10/2017 1446   PROTEINUR trace 03/12/2016 0856   UROBILINOGEN 0.2 03/12/2016 0856   NITRITE Negative 06/10/2017 1446   NITRITE neg 03/12/2016 0856   LEUKOCYTESUR Negative 06/10/2017 1446   Sepsis Labs: No results found for this or any previous visit (from the past 240 hour(s)).   Radiological Exams on Admission: DG Chest 2 View  Result Date: 06/22/2019 CLINICAL DATA:  Right chest pain EXAM: CHEST - 2 VIEW COMPARISON:  09/01/2017 FINDINGS: The lungs are clear without focal pneumonia, edema, pneumothorax or pleural effusion. The cardiopericardial silhouette is within normal limits for size. The visualized bony structures of the thorax are intact. Surgical changes noted in both shoulders. IMPRESSION: No active cardiopulmonary disease. Electronically Signed   By: Misty [name] M.D.   On: 06/22/2019 09:35   CT Chest High Resolution  Result Date: 06/20/2019 CLINICAL DATA:  74 year old female with history of interstitial lung disease. Evaluate for bronchiectasis. EXAM: CT CHEST WITHOUT CONTRAST TECHNIQUE: Multidetector CT imaging of the chest was performed following the standard protocol without intravenous contrast. High resolution imaging of the lungs, as well as  inspiratory and expiratory imaging, was performed. COMPARISON:  No priors. FINDINGS: Cardiovascular: Heart size is normal. There is no significant pericardial fluid, thickening or pericardial calcification. There is aortic atherosclerosis, as well as atherosclerosis of the great vessels of the mediastinum and the coronary arteries, including calcified atherosclerotic plaque in the left main and left anterior descending coronary arteries. Mediastinum/Nodes: No pathologically enlarged mediastinal or hilar lymph nodes. Please note that accurate exclusion of hilar adenopathy is limited on noncontrast CT scans. Esophagus is unremarkable in appearance. No axillary lymphadenopathy. Lungs/Pleura: High-resolution images demonstrates some patchy areas of ground-glass attenuation, peripheral predominant septal thickening and mild cylindrical bronchiectasis noted throughout the lungs bilaterally, most evident in the mid to lower lungs. No honeycombing. Inspiratory and expiratory imaging demonstrates moderate air trapping indicative of small airways disease. There is also extensive collapse of the trachea and mainstem bronchi. Upper Abdomen: Aortic  atherosclerosis. Musculoskeletal: There are no aggressive appearing lytic or blastic lesions noted in the visualized portions of the skeleton. IMPRESSION: 1. There is a spectrum of findings compatible with interstitial lung disease, categorized as probable usual interstitial pneumonia (UIP) per current ATS guidelines. However, there is lack of overt honeycombing, and moderate air trapping which is unusual for usual interstitial pneumonia. The possibility of nonspecific interstitial pneumonia (NSIP) should be considered. 2. Severe tracheobronchomalacia. 3. Aortic atherosclerosis, in addition to left main and left anterior descending coronary artery disease. Assessment for potential risk factor modification, dietary therapy or pharmacologic therapy may be warranted, if clinically  indicated. Aortic Atherosclerosis (ICD10-I70.0). Electronically Signed   By: Vinnie Langton M.D.   On: 06/20/2019 16:23    EKG: Independently reviewed. Sinus rhythm at 62 bpm and appears similar to previous EKGs Assessment/Plan Chest pain: Acute.  Patient present with complaints of substernal chest pain with radiation into the right side of her neck and jaw.  High-sensitivity troponins negative x2.  Patient did have a recent CT of the chest on 5/4 that noted left main and left anterior descending coronary artery disease.  Heart score = 6. -Admit to a telemetry bed -Check lipid panel -Check echocardiogram -Message sent to [facility] for cardiology to eval in a.m. for possible need of stress test -n.p.o. after midnight -Held Celebrex as it can increase risks of thrombolytic events  Interstitial lung disease: Patient notes having a long history of cough.  Being followed currently by Dr. Shearon Stalls of pulmonology suspected to have bronchiectasis. -Continue outpatient follow-up  Anxiety and depression -Continue Paxil  Chronic pain and opioid use: Patient on fentanyl patch as well as hydrocodone every 12 hours as needed for pain. -Continue current home regimen  Dyslipidemia -Follow-up lipid panel -Continue atorvastatin  GERD -Protonix  DVT prophylaxis: Lovenox Code Status: Full Family Communication: No family requested to be updated at this time Disposition Plan: Possible discharge home if cardiac work-up negative Consults called: Message sent for cardiology to eval in a.m. Admission status: Observation  Norval Morton MD [facility] Pager [phone]   If 7PM-7AM, please contact night-coverage [email or web address] Password [email or web address]  06/22/2019, 12:01 PM

## 2019-06-22 NOTE — ED Notes (Signed)
House tray ordered for pt

## 2019-06-22 NOTE — ED Provider Notes (Signed)
[facility] EMERGENCY DEPARTMENT Provider Note   CSN: [id number] Arrival date & time: 06/22/19  [id number]     History Chief Complaint  Patient presents with  . Chest Pain    Beverly [name] is a 74 y.o. female.  HPI 74 year old female presents with chest pain.  Started while at work shortly prior to arrival.  She was answering the phones and felt a sudden pressure in her chest.  Radiated up to her right neck.  It was heavy and overall lasted 5-10 minutes.  Felt a little short of breath.  Did not have any diaphoresis, vomiting, or back or abdominal pain.  No new leg swelling from baseline.  Symptoms all completely resolved.  History of amaurosis fugax, and hypercholesterolemia.  She denies hypertension or prior CAD.   Past Medical History:  Diagnosis Date  . Allergy   . Amaurosis fugax    negative w/u through [facility]  . Anxiety   . Asthma   . Back pain    Dr Joline Salt 02/2010-epidural injection x 2 at L4-5 with good effect  . 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 BCC (basal cell carcinoma of skin) 05/09/2014   mid lower back  . BCC (basal cell carcinoma of skin) 05/03/2017   right low back  . BCC (basal cell carcinoma of skin) 05/03/2017   left upper back  . BCC (basal cell carcinoma of skin) 07/05/2018   left mid back  . BCC (basal cell carcinoma of skin) 05/20/1992   upper back  . BCC (basal cell carcinoma of skin) 07/29/1993   left sholder medial  . BCC (basal cell carcinoma of skin) 07/29/1993   left sholder lateral  . BCC (basal cell carcinoma of skin) 07/29/1993   right thigh  . BCC (basal cell carcinoma of skin) 07/29/1993   right sholder  . BCC (basal cell carcinoma of skin) 12/22/1994   right mid forearm  . BCC (basal cell carcinoma of skin) 12/22/1994   right upper forearm  . BCC (basal cell carcinoma of skin) 12/22/1994   lower right upper arm  . BCC (basal cell carcinoma of skin) 12/22/1994   right upper arm sholder  .  BCC (basal cell carcinoma of skin) 08/11/1995   left leg below knee  . BCC (basal cell carcinoma of skin) 04/11/2002   mid back  . BCC (basal cell carcinoma of skin) 12/10/2002   right center upper back  . BCC (basal cell carcinoma of skin) 05/26/2005   right post sholder  . BCC (basal cell carcinoma) 05/09/2014   left inner shin  . BCC (basal cell carcinoma) 06/12/2014   left forearm  . Bowen's disease 10/07/1994   right post knee, right inner forearm/wrist  . Bowen's disease 08/11/1995   left sholder  . Cataract    left  . Chronic pain   . Depression   . Diverticulosis of colon 1998   mild  . DJD (degenerative joint disease)   . Duodenal ulcer    h/o  . GERD (gastroesophageal reflux disease)   . History of SCC (squamous cell carcinoma) of skin    Dr. Denna Haggard  . History of sinus bradycardia   . HLD (hyperlipidemia)    hypertriglyceridemia  . Hypertensive retinopathy of both eyes 11/18/2017  . IBS (irritable bowel syndrome)   . Internal hemorrhoids 1998  . Mitral regurgitation    mild  . Ocular migraine 10/31/14   Dr.Hecker  . Osteoarthritis    feet,shoulder,neck,back,hips and  hands.  . Pneumonia   . PONV (postoperative nausea and vomiting)   . Rotator cuff tear, right 02/2019   infraspinatus and supraspinatus, and dislocation of long head of bicep tendon (Dr. Alvan Dame)  . SCC (squamous cell carcinoma) 11/26/2014   left hand, right hand, right deltoid  . SCC (squamous cell carcinoma) 05/03/2017   left cheek  . Sleep apnea    uses a mouth guard nightly  . Squamous cell carcinoma in situ (SCCIS) 07/05/2018   left hand  . Vitamin D deficiency    mild    Patient Active Problem List   Diagnosis Date Noted  . S/P reverse total shoulder arthroplasty, right 04/11/2019  . Aortic atherosclerosis ([place]) 04/10/2019  . Brow ptosis 07/28/2018  . Facial trauma 07/28/2018  . Nasal fracture 07/28/2018  . Abnormal lung sounds 10/16/2016  . Moderate persistent asthma 09/18/2016   . Transient visual loss of both eyes 03/16/2016  . Amaurosis fugax 01/09/2015  . Postmenopausal symptoms 11/26/2014  . Left shoulder pain 10/02/2014  . Right hip pain 09/05/2014  . Unspecified asthma, with exacerbation 04/25/2012  . GERD (gastroesophageal reflux disease) 04/06/2011  . Asthmatic bronchitis 04/06/2011  . Allergic rhinitis 04/06/2011  . Mixed hyperlipidemia 04/06/2011  . Chronic back pain 04/06/2011  . Cough 02/20/2011    Past Surgical History:  Procedure Laterality Date  . BLEPHAROPLASTY Bilateral 01/2018  . BROW LIFT Bilateral 10/10/2018   Procedure: BROW LIFT;  Surgeon: Wallace Going, DO;  Location: [facility];  Service: Plastics;  Laterality: Bilateral;  . BUNIONECTOMY     R 12/08, L 2004 (Dr. Janus Molder)  . CARPAL TUNNEL RELEASE  1989   bilateral  . CATARACT EXTRACTION Bilateral Left in 08/2012, Right 04/2016   Dr.Hecker  . CESAREAN SECTION     x2  . CHOLECYSTECTOMY  1992  . CLOSED REDUCTION NASAL FRACTURE N/A 08/29/2018   Procedure: CLOSED REDUCTION NASAL FRACTURE;  Surgeon: Wallace Going, DO;  Location: [facility];  Service: Plastics;  Laterality: N/A;  1 hour, please  . COLONOSCOPY  2006  . COLONOSCOPY  01/2012   due again 01/2022; mild diverticulosis  . epidural steroid injection, back  02/2010  . HIP SURGERY     right bursectomy x 3  . HIP SURGERY Right 2000   torn cartilage, repaired  . INGUINAL HERNIA REPAIR  9/09   bilat  . NECK SURGERY  1989   c6-7 cervical laminectomy and diskecotmy  . NECK SURGERY  1989   per patient posterior area of neck  . REVERSE SHOULDER ARTHROPLASTY Right 04/11/2019   Procedure: REVERSE SHOULDER ARTHROPLASTY;  Surgeon: Nicholes Stairs, MD;  Location: [facility];  Service: Orthopedics;  Laterality: Right;  [facility]  . SHOULDER SURGERY  2/05   left rotator cuff repair  . SHOULDER SURGERY Left 03/14/2018   rotator cuff repair; Dr. Tonita Cong  . TONSILLECTOMY  age 54  . TOTAL  ABDOMINAL HYSTERECTOMY W/ BILATERAL SALPINGOOPHORECTOMY  1997   fibroids  . TROCHANTERIC BURSA EXCISION Right 1978  . TUBAL LIGATION  1986  . WISDOM TOOTH EXTRACTION       OB History    Gravida  2   Para  2   Term      Preterm      AB      Living  2     SAB      TAB      Ectopic      Multiple  Live Births              Family History  Problem Relation Age of Onset  . Depression Mother   . Schizophrenia Mother   . Cancer Father        oral  . Heart disease Father        bradycardia  . Cancer Sister        skin and lung  . COPD Sister   . Hypertension Sister   . Osteoporosis Sister        compression fx's x 3 11/2018  . Hyperthyroidism Brother   . Cancer Brother        metastatic cancer to bone (?primary)  . Hyperlipidemia Daughter   . Asthma Son   . Cancer Paternal Grandmother 86       colon cancer, metastatic to liver  . Colon cancer Paternal Grandmother   . Diabetes Maternal Grandmother   . ADD / ADHD Other     Social History   Tobacco Use  . Smoking status: Never Smoker  . Smokeless tobacco: Never Used  Substance Use Topics  . Alcohol use: Yes    Alcohol/week: 7.0 standard drinks    Types: 7 Glasses of wine per week    Comment: 1 glass of wine daily  . Drug use: No    Home Medications Prior to Admission medications   Medication Sig Start Date End Date Taking? Authorizing Provider  acetaminophen (TYLENOL) 500 MG tablet Take 1,000 mg by mouth every 6 (six) hours as needed for moderate pain.    Yes [provider]  albuterol (PROVENTIL HFA;VENTOLIN HFA) 108 (90 Base) MCG/ACT inhaler Inhale 2 puffs into the lungs every 6 (six) hours as needed for wheezing or shortness of breath. 07/02/16  Yes Rita Ohara, MD  amoxicillin (AMOXIL) 500 MG tablet Take 2,000 mg by mouth as directed. For dental appointments   Yes [provider]  aspirin EC 81 MG tablet Take 81 mg by mouth daily.   Yes [provider]  atorvastatin  (LIPITOR) 40 MG tablet Take 1 tablet (40 mg total) by mouth daily. 01/16/19  Yes Rita Ohara, MD  Calcium Carbonate-Vitamin D (CALCIUM 600+D) 600-200 MG-UNIT TABS Take 1 tablet by mouth 2 (two) times daily.     Yes [provider]  celecoxib (CELEBREX) 200 MG capsule Take 1 capsule (200 mg total) by mouth 2 (two) times daily. 01/16/19  Yes Rita Ohara, MD  cetirizine (ZYRTEC) 10 MG tablet Take 10 mg by mouth daily.   Yes [provider]  diclofenac Sodium (VOLTAREN) 1 % GEL Apply 2 g topically 4 (four) times daily as needed (joint pain).  12/29/18  Yes [provider]  esomeprazole (NEXIUM) 40 MG capsule TAKE 1 CAPSULE BY MOUTH DAILY BEFORE SUPPER. 06/15/19  Yes Rita Ohara, MD  fentaNYL (DURAGESIC - DOSED MCG/HR) 25 MCG/HR Place 1 patch onto the skin every 3 (three) days.     Yes [provider]  fluticasone (FLONASE) 50 MCG/ACT nasal spray PLACE 2 SPRAYS INTO THE NOSE DAILY. Patient taking differently: Place 2 sprays into both nostrils in the morning and at bedtime.  07/13/18  Yes Rita Ohara, MD  fluticasone (FLOVENT HFA) 220 MCG/ACT inhaler INHALE 2 PUFFS INTO THE LUNGS 2 (TWO) TIMES DAILY. Patient taking differently: Inhale 2 puffs into the lungs in the morning and at bedtime.  07/13/18  Yes Rita Ohara, MD  gabapentin (NEURONTIN) 800 MG tablet Take 800 mg by mouth at bedtime.  Yes [provider]  GUAIFENESIN ER PO Take 400 mg by mouth 2 (two) times daily.    Yes [provider]  HYDROcodone-acetaminophen (NORCO) 7.5-325 MG tablet Take 1 tablet by mouth every 12 (twelve) hours as needed for moderate pain.  10/06/18  Yes [provider]  loperamide (IMODIUM A-D) 2 MG tablet Take 2 mg by mouth 4 (four) times daily as needed for diarrhea or loose stools.   Yes [provider]  methocarbamol (ROBAXIN) 500 MG tablet Take 500 mg by mouth in the morning and at bedtime.    Yes [provider]  montelukast (SINGULAIR) 10 MG tablet  Take 1 tablet (10 mg total) by mouth at bedtime. 07/13/18  Yes Rita Ohara, MD  Multiple Vitamins-Minerals (MULTIVITAMIN WITH MINERALS) tablet Take 1 tablet by mouth daily.   Yes [provider]  niacinamide 500 MG tablet Take 500 mg by mouth 2 (two) times daily with a meal.   Yes [provider]  omega-3 acid ethyl esters (LOVAZA) 1 g capsule TAKE 2 CAPSULES BY MOUTH 2 TIMES DAILY. Patient taking differently: Take 2 g by mouth 2 (two) times daily.  03/03/19  Yes Rita Ohara, MD  PARoxetine (PAXIL) 30 MG tablet Take 60 mg by mouth daily.  07/17/14  Yes [provider]  Azelastine HCl 137 MCG/SPRAY SOLN Place 2 Pump into both nostrils 2 (two) times a day. Patient not taking: Reported on 06/22/2019 07/13/18   Rita Ohara, MD  estradiol (ESTRACE) 0.5 MG tablet TAKE 1 TABLET BY MOUTH ONCE DAILY Patient not taking: No sig reported 02/14/18   Rita Ohara, MD  Fluorouracil (TOLAK) 4 % CREA Apply 1 application topically every 3 (three) days.     [provider]  loratadine (CLARITIN) 10 MG tablet Take 10 mg by mouth daily.    [provider]    Allergies    Adhesive [tape] and Codeine  Review of Systems   Review of Systems  Constitutional: Negative for diaphoresis.  Respiratory: Positive for shortness of breath.   Cardiovascular: Positive for chest pain.  Gastrointestinal: Negative for abdominal pain and vomiting.  Musculoskeletal: Negative for back pain.  All other systems reviewed and are negative.   Physical Exam Updated Vital Signs BP 139/72   Pulse (!) 52   Temp 97.6 F (36.4 C) (Oral)   Resp 12   Ht 5\' 5"  (1.651 m)   Wt 73.8 kg   SpO2 94%   BMI 27.07 kg/m   Physical Exam Vitals and nursing note reviewed.  Constitutional:      General: She is not in acute distress.    Appearance: She is well-developed. She is not ill-appearing or diaphoretic.  HENT:     Head: Normocephalic and atraumatic.     Right Ear: External ear normal.     Left Ear:  External ear normal.     Nose: Nose normal.  Eyes:     General:        Right eye: No discharge.        Left eye: No discharge.  Cardiovascular:     Rate and Rhythm: Normal rate and regular rhythm.     Pulses:          Radial pulses are 2+ on the right side and 2+ on the left side.     Heart sounds: Normal heart sounds.  Pulmonary:     Effort: Pulmonary effort is normal.     Breath sounds: Normal breath sounds.  Abdominal:  Palpations: Abdomen is soft.     Tenderness: There is no abdominal tenderness.  Skin:    General: Skin is warm and dry.  Neurological:     Mental Status: She is alert.  Psychiatric:        Mood and Affect: Mood is not anxious.     ED Results / Procedures / Treatments   Labs (all labs ordered are listed, but only abnormal results are displayed) Labs Reviewed  CBC - Abnormal; Notable for the following components:      Result Value   MCV 102.1 (*)    All other components within normal limits  RESPIRATORY PANEL BY RT PCR (FLU A&B, COVID)  BASIC METABOLIC PANEL  TROPONIN I (HIGH SENSITIVITY)  TROPONIN I (HIGH SENSITIVITY)    EKG EKG Interpretation  Date/Time:  Thursday Jun 22 2019 09:11:34 EDT Ventricular Rate:  62 PR Interval:  208 QRS Duration: 84 QT Interval:  416 QTC Calculation: 422 R Axis:   29 Text Interpretation: Normal sinus rhythm Low voltage QRS Cannot rule out Anterior infarct , age undetermined Abnormal ECG subtle ST depressions seem similar to 2009 Confirmed by Sherwood Gambler [phone]) on 06/22/2019 9:41:36 AM   Radiology DG Chest 2 View  Result Date: 06/22/2019 CLINICAL DATA:  Right chest pain EXAM: CHEST - 2 VIEW COMPARISON:  09/01/2017 FINDINGS: The lungs are clear without focal pneumonia, edema, pneumothorax or pleural effusion. The cardiopericardial silhouette is within normal limits for size. The visualized bony structures of the thorax are intact. Surgical changes noted in both shoulders. IMPRESSION: No active cardiopulmonary  disease. Electronically Signed   By: Misty [name] M.D.   On: 06/22/2019 09:35   CT Chest High Resolution  Result Date: 06/20/2019 CLINICAL DATA:  74 year old female with history of interstitial lung disease. Evaluate for bronchiectasis. EXAM: CT CHEST WITHOUT CONTRAST TECHNIQUE: Multidetector CT imaging of the chest was performed following the standard protocol without intravenous contrast. High resolution imaging of the lungs, as well as inspiratory and expiratory imaging, was performed. COMPARISON:  No priors. FINDINGS: Cardiovascular: Heart size is normal. There is no significant pericardial fluid, thickening or pericardial calcification. There is aortic atherosclerosis, as well as atherosclerosis of the great vessels of the mediastinum and the coronary arteries, including calcified atherosclerotic plaque in the left main and left anterior descending coronary arteries. Mediastinum/Nodes: No pathologically enlarged mediastinal or hilar lymph nodes. Please note that accurate exclusion of hilar adenopathy is limited on noncontrast CT scans. Esophagus is unremarkable in appearance. No axillary lymphadenopathy. Lungs/Pleura: High-resolution images demonstrates some patchy areas of ground-glass attenuation, peripheral predominant septal thickening and mild cylindrical bronchiectasis noted throughout the lungs bilaterally, most evident in the mid to lower lungs. No honeycombing. Inspiratory and expiratory imaging demonstrates moderate air trapping indicative of small airways disease. There is also extensive collapse of the trachea and mainstem bronchi. Upper Abdomen: Aortic atherosclerosis. Musculoskeletal: There are no aggressive appearing lytic or blastic lesions noted in the visualized portions of the skeleton. IMPRESSION: 1. There is a spectrum of findings compatible with interstitial lung disease, categorized as probable usual interstitial pneumonia (UIP) per current ATS guidelines. However, there is lack of  overt honeycombing, and moderate air trapping which is unusual for usual interstitial pneumonia. The possibility of nonspecific interstitial pneumonia (NSIP) should be considered. 2. Severe tracheobronchomalacia. 3. Aortic atherosclerosis, in addition to left main and left anterior descending coronary artery disease. Assessment for potential risk factor modification, dietary therapy or pharmacologic therapy may be warranted, if clinically indicated. Aortic Atherosclerosis (ICD10-I70.0). Electronically  Signed   By: Vinnie Langton M.D.   On: 06/20/2019 16:23    Procedures Procedures (including critical care time)  Medications Ordered in ED Medications  sodium chloride flush (NS) 0.9 % injection 3 mL (has no administration in time range)  aspirin chewable tablet 324 mg (324 mg Oral Given 06/22/19 1006)    ED Course  I have reviewed the triage vital signs and the nursing notes.  Pertinent labs & imaging results that were available during my care of the patient were reviewed by me and considered in my medical decision making (see chart for details).    MDM Rules/Calculators/A&P HEAR Score: 6                    Patient's labs, ECG, and chest x-ray have all been personally viewed and reviewed.  Subtle ST depression seem to be baseline.  With a heart score of 6, I had a discussion of options with patient.  She prefers to be admitted/observed.  I think this is reasonable with her presentation today with concerning sounding story.  My suspicion of dissection or PE is pretty low.  Admit to hospitalist service. Final Clinical Impression(s) / ED Diagnoses Final diagnoses:  Chest pain radiating to jaw    Rx / DC Orders ED Discharge Orders    None       Sherwood Gambler, MD 06/22/19 1126

## 2019-06-22 NOTE — Progress Notes (Signed)
PHARMACIST - PHYSICIAN ORDER COMMUNICATION  CONCERNING: P&T Medication Policy on Herbal Medications  DESCRIPTION:  This patient's order for:  Niacinamide has been noted.  This product(s) is classified as an "herbal" or natural product. Due to a lack of definitive safety studies or FDA approval, nonstandard manufacturing practices, plus the potential risk of unknown drug-drug interactions while on inpatient medications, the Pharmacy and Therapeutics Committee does not permit the use of "herbal" or natural products of this type within [facility].   ACTION TAKEN: The pharmacy department is unable to verify this order at this time and your patient has been informed of this safety policy. Please reevaluate patient's clinical condition at discharge and address if the herbal or natural product(s) should be resumed at that time.

## 2019-06-22 NOTE — ED Triage Notes (Signed)
Pt reports while sitting at a desk this morning, she began having R sided chest pain that radiates to her jaw. 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 resp e/u, nad.

## 2019-06-23 ENCOUNTER — Observation Stay (HOSPITAL_BASED_OUTPATIENT_CLINIC_OR_DEPARTMENT_OTHER): Payer: Medicare PPO

## 2019-06-23 DIAGNOSIS — E782 Mixed hyperlipidemia: Secondary | ICD-10-CM | POA: Diagnosis not present

## 2019-06-23 DIAGNOSIS — K219 Gastro-esophageal reflux disease without esophagitis: Secondary | ICD-10-CM | POA: Diagnosis not present

## 2019-06-23 DIAGNOSIS — J849 Interstitial pulmonary disease, unspecified: Secondary | ICD-10-CM | POA: Diagnosis not present

## 2019-06-23 DIAGNOSIS — R079 Chest pain, unspecified: Secondary | ICD-10-CM | POA: Diagnosis not present

## 2019-06-23 LAB — ECHOCARDIOGRAM COMPLETE
Height: 65 in
Weight: 2603.19 oz

## 2019-06-23 NOTE — Progress Notes (Signed)
Progress Note    Beverly [name]  [id number] DOB: 1945-05-12  DOA: 06/22/2019 PCP: Rita Ohara, MD    Brief Narrative:     Medical records reviewed and are as summarized below:  Beverly [name] is an 74 y.o. female with medical history significant of HLD, interstitial lung disease, amaurosis fugax with negative work-up, anxiety, depression, and chronic pain who presented with complaints of chest pain.  She was sitting down at the time when she reported having acute onset of substernal pressure-like pain that thereafter radiated up the right side of her neck into her jaw.  Notes that she has never had a pain like this before.  She reports that she may have felt a little short of breath.  Denied having any diaphoresis, nausea, vomiting, abdominal pain, lightheadedness, calf pain, or palpitations.  She has lower extremity swelling, cough, and intermittent shortness of breath that she reports is more so chronic.  Records note a low-grade stress test performed last in 2018.  Her last echocardiogram from 01/2015 noted EF of 55 to 60%.  Her pulmonologist Dr. Shearon Stalls had checked a CT of her chest due to history of chronic cough and asthma just 2 days ago.  Assessment/Plan:   Principal Problem:   Chest pain Active Problems:   GERD (gastroesophageal reflux disease)   Mixed hyperlipidemia   Interstitial lung disease (HCC)  Chest pain: Acute.  Patient present with complaints of substernal chest pain with radiation into the right side of her neck and jaw.  High-sensitivity troponins negative x2.  Patient did have a recent CT of the chest on 5/4 that noted left main and left anterior descending coronary artery disease.  Heart score = 6. -Patient has been seen by cardiology and consult appreciated.  They are recommending a stress test but unfortunately stress test cannot be done today due to not enough staff, so apparently the stress test will be done tomorrow morning, made n.p.o. after  midnight  Interstitial lung disease: Patient notes having a long history of cough.  Being followed currently by Dr. Shearon Stalls of pulmonology suspected to have bronchiectasis. -Continue outpatient follow-up  Anxiety and depression -Continue Paxil  Chronic pain and opioid use: Patient on fentanyl patch as well as hydrocodone every 12 hours as needed for pain. -Continue current home regimen  Dyslipidemia -Follow-up lipid panel -Continue atorvastatin  GERD -Protonix    Family Communication/Anticipated D/C date and plan/Code Status   DVT prophylaxis: Lovenox ordered. Code Status: Full Code.  Disposition Plan: Status is: Observation  The patient remains OBS appropriate and will d/c before 2 midnights.  Dispo: The patient is from: Home              Anticipated d/c is to: Home              Anticipated d/c date is: 1 day              Patient currently is not medically stable to d/c.         Medical Consultants:    cards.    Subjective:   No further chest pain  Objective:    Vitals:   06/22/19 1530 06/22/19 1829 06/22/19 2348 06/23/19 0549  BP:  (!) 132/59 (!) 128/57 137/62  Pulse:  (!) 53 (!) 55 (!) 54  Resp:  16 17 16   Temp: 98.2 F (36.8 C) 98 F (36.7 C) 98.1 F (36.7 C) 97.8 F (36.6 C)  TempSrc: Oral Oral Oral Oral  SpO2:  95% 93% 96%  Weight:      Height:        Intake/Output Summary (Last 24 hours) at 06/23/2019 1613 Last data filed at 06/23/2019 0300 Gross per 24 hour  Intake 240 ml  Output --  Net 240 ml   Filed Weights   06/22/19 1110  Weight: 73.8 kg    Exam:  General: Appearance:     Well developed, well nourished female in no acute distress  Eyes:    PERRL, conjunctiva/corneas clear, EOM's intact       Lungs:     Clear to auscultation bilaterally, respirations unlabored  Heart:    Bradycardic. Normal rhythm. No murmurs, rubs, or gallops.   MS:   All extremities are intact.   Neurologic:   Awake, alert, oriented x 3. No  apparent focal neurological           defect.     Data Reviewed:   I have personally reviewed following labs and imaging studies:  Labs: Labs show the following:   Basic Metabolic Panel: Recent Labs  Lab 06/22/19 0917  NA 142  K 3.9  CL 106  CO2 28  GLUCOSE 94  BUN 15  CREATININE 0.69  CALCIUM 9.7   GFR Estimated Creatinine Clearance: 64.9 mL/min (by C-G formula based on SCr of 0.69 mg/dL). Liver Function Tests: No results for input(s): AST, ALT, ALKPHOS, BILITOT, PROT, ALBUMIN in the last 168 hours. No results for input(s): LIPASE, AMYLASE in the last 168 hours. No results for input(s): AMMONIA in the last 168 hours. Coagulation profile No results for input(s): INR, PROTIME in the last 168 hours.  CBC: Recent Labs  Lab 06/22/19 0917  WBC 10.0  HGB 14.1  HCT 44.8  MCV 102.1*  PLT 208   Cardiac Enzymes: No results for input(s): CKTOTAL, CKMB, CKMBINDEX, TROPONINI in the last 168 hours. BNP (last 3 results) No results for input(s): PROBNP in the last 8760 hours. CBG: No results for input(s): GLUCAP in the last 168 hours. D-Dimer: No results for input(s): DDIMER in the last 72 hours. Hgb A1c: No results for input(s): HGBA1C in the last 72 hours. Lipid Profile: Recent Labs    06/22/19 1247  CHOL 161  HDL 49  LDLCALC 73  TRIG 197*  CHOLHDL 3.3   Thyroid function studies: No results for input(s): TSH, T4TOTAL, T3FREE, THYROIDAB in the last 72 hours.  Invalid input(s): FREET3 Anemia work up: No results for input(s): VITAMINB12, FOLATE, FERRITIN, TIBC, IRON, RETICCTPCT in the last 72 hours. Sepsis Labs: Recent Labs  Lab 06/22/19 0917  WBC 10.0    Microbiology Recent Results (from the past 240 hour(s))  Respiratory Panel by RT PCR (Flu A&B, Covid) - Nasopharyngeal Swab     Status: None   Collection Time: 06/22/19 11:10 AM   Specimen: Nasopharyngeal Swab  Result Value Ref Range Status   SARS Coronavirus 2 by RT PCR NEGATIVE NEGATIVE Final     Comment: (NOTE) SARS-CoV-2 target nucleic acids are NOT DETECTED. The SARS-CoV-2 RNA is generally detectable in upper respiratoy specimens during the acute phase of infection. The lowest concentration of SARS-CoV-2 viral copies this assay can detect is 131 copies/mL. A negative result does not preclude SARS-Cov-2 infection and should not be used as the sole basis for treatment or other patient management decisions. A negative result may occur with  improper specimen collection/handling, submission of specimen other than nasopharyngeal swab, presence of viral mutation(s) within the areas targeted by this assay, and inadequate number  of viral copies (<131 copies/mL). A negative result must be combined with clinical observations, patient history, and epidemiological information. The expected result is Negative. Fact Sheet for Patients:  PinkCheek.be Fact Sheet for Healthcare Providers:  GravelBags.it This test is not yet ap proved or cleared by the [place] FDA and  has been authorized for detection and/or diagnosis of SARS-CoV-2 by FDA under an Emergency Use Authorization (EUA). This EUA will remain  in effect (meaning this test can be used) for the duration of the COVID-19 declaration under Section 564(b)(1) of the Act, 21 U.S.C. section 360bbb-3(b)(1), unless the authorization is terminated or revoked sooner.    Influenza A by PCR NEGATIVE NEGATIVE Final   Influenza B by PCR NEGATIVE NEGATIVE Final    Comment: (NOTE) The Xpert Xpress SARS-CoV-2/FLU/RSV assay is intended as an aid in  the diagnosis of influenza from Nasopharyngeal swab specimens and  should not be used as a sole basis for treatment. Nasal washings and  aspirates are unacceptable for Xpert Xpress SARS-CoV-2/FLU/RSV  testing. Fact Sheet for Patients: PinkCheek.be Fact Sheet for Healthcare  Providers: GravelBags.it This test is not yet approved or cleared by the [place] FDA and  has been authorized for detection and/or diagnosis of SARS-CoV-2 by  FDA under an Emergency Use Authorization (EUA). This EUA will remain  in effect (meaning this test can be used) for the duration of the  Covid-19 declaration under Section 564(b)(1) of the Act, 21  U.S.C. section 360bbb-3(b)(1), unless the authorization is  terminated or revoked. Performed at [facility], [place] [address], [place], [place] [address]     Procedures and diagnostic studies:  DG Chest 2 View  Result Date: 06/22/2019 CLINICAL DATA:  Right chest pain EXAM: CHEST - 2 VIEW COMPARISON:  09/01/2017 FINDINGS: The lungs are clear without focal pneumonia, edema, pneumothorax or pleural effusion. The cardiopericardial silhouette is within normal limits for size. The visualized bony structures of the thorax are intact. Surgical changes noted in both shoulders. IMPRESSION: No active cardiopulmonary disease. Electronically Signed   By: Misty [name] M.D.   On: 06/22/2019 09:35   ECHOCARDIOGRAM COMPLETE  Result Date: 06/23/2019    ECHOCARDIOGRAM REPORT   Patient Name:   ADALAE KAAI Date of Exam: 06/23/2019 Medical Rec #:  [id number]         Height:       65.0 in Accession #:    [id number]        Weight:       162.7 lb Date of Birth:  1945-05-08        BSA:          1.812 m Patient Age:    31 years          BP:           137/62 mmHg Patient Gender: F                 HR:           54 bpm. Exam Location:  Inpatient Procedure: 2D Echo Indications:    chest pain  History:        Patient has prior history of Echocardiogram examinations, most                 recent 01/17/2015. Risk Factors:Dyslipidemia.  Sonographer:    Jannett Celestine RDCS (AE) Referring Phys: [phone] [facility]  Sonographer Comments: challenging apical windows IMPRESSIONS  1. Left ventricular ejection fraction, by estimation, is 55 to  60%. The left ventricle has normal function. The left ventricle has no regional wall motion abnormalities. There is mild left ventricular hypertrophy. Left ventricular diastolic function could not be evaluated.  2. Right ventricular systolic function is normal. The right ventricular size is normal.  3. The mitral valve is grossly normal. Trivial mitral valve regurgitation. No evidence of mitral stenosis.  4. The aortic valve has an indeterminant number of cusps. Aortic valve regurgitation is not visualized. Mild aortic valve sclerosis is present, with no evidence of aortic valve stenosis. FINDINGS  Left Ventricle: Left ventricular ejection fraction, by estimation, is 55 to 60%. The left ventricle has normal function. The left ventricle has no regional wall motion abnormalities. The left ventricular internal cavity size was normal in size. There is  mild left ventricular hypertrophy. Left ventricular diastolic function could not be evaluated. Right Ventricle: The right ventricular size is normal. No increase in right ventricular wall thickness. Right ventricular systolic function is normal. Left Atrium: Left atrial size was normal in size. Right Atrium: Right atrial size was normal in size. Pericardium: Trivial pericardial effusion is present. Mitral Valve: The mitral valve is grossly normal. Trivial mitral valve regurgitation. No evidence of mitral valve stenosis. Tricuspid Valve: The tricuspid valve is grossly normal. Tricuspid valve regurgitation is trivial. No evidence of tricuspid stenosis. Aortic Valve: The aortic valve has an indeterminant number of cusps. Aortic valve regurgitation is not visualized. Mild aortic valve sclerosis is present, with no evidence of aortic valve stenosis. There is mild calcification of the aortic valve. Pulmonic Valve: The pulmonic valve was not well visualized. Pulmonic valve regurgitation is not visualized. No evidence of pulmonic stenosis. Aorta: The aortic arch was not well  visualized and the ascending aorta was not well visualized. IAS/Shunts: The atrial septum is grossly normal.  LEFT VENTRICLE PLAX 2D LVIDd:         2.80 cm  Diastology LVIDs:         2.00 cm  LV e' lateral:   6.85 cm/s LV PW:         1.20 cm  LV E/e' lateral: 10.5 LV IVS:        1.50 cm LVOT diam:     1.80 cm LV SV:         60 LV SV Index:   33 LVOT Area:     2.54 cm  LEFT ATRIUM             Index LA diam:        2.50 cm 1.38 cm/m LA Vol (A2C):   50.0 ml 27.59 ml/m LA Vol (A4C):   26.5 ml 14.62 ml/m LA Biplane Vol: 36.2 ml 19.98 ml/m  AORTIC VALVE LVOT Vmax:   90.70 cm/s LVOT Vmean:  70.500 cm/s LVOT VTI:    0.235 m  AORTA Ao Root diam: 2.60 cm MITRAL VALVE MV Area (PHT): 2.87 cm    SHUNTS MV Decel Time: 264 msec    Systemic VTI:  0.24 m MV E velocity: 72.00 cm/s  Systemic Diam: 1.80 cm MV A velocity: 61.30 cm/s MV E/A ratio:  1.17 Buford Dresser MD Electronically signed by Buford Dresser MD Signature Date/Time: 06/23/2019/12:20:30 PM    Final     Medications:   . aspirin EC  81 mg Oral Daily  . atorvastatin  40 mg Oral QHS  . enoxaparin (LOVENOX) injection  40 mg Subcutaneous Q24H  . fentaNYL  1 patch Transdermal Q72H  . fluticasone  2 spray Each Nare BID  .  gabapentin  800 mg Oral QHS  . guaiFENesin  600 mg Oral BID  . loratadine  10 mg Oral Daily  . montelukast  10 mg Oral QHS  . pantoprazole  40 mg Oral QAC supper  . PARoxetine  60 mg Oral Daily  . sodium chloride flush  3 mL Intravenous Once   Continuous Infusions:   LOS: 0 days   Geradine Girt  [facility]   How to contact the [facility] Attending or Consulting provider [place] or covering provider during after hours [place], for this patient?  1. Check the care team in [facility] and look for a) attending/consulting [facility] provider listed and b) the [facility] team listed 2. Log into [email or web address] and use [place]'s universal password to access. If you do not have the password, please contact the hospital operator. 3. Locate  the [facility] provider you are looking for under [facility] and page to a number that you can be directly reached. 4. If you still have difficulty reaching the provider, please page the [facility] (Director on Call) for the Hospitalists listed on amion for assistance.  06/23/2019, 4:13 PM

## 2019-06-23 NOTE — Progress Notes (Signed)
No further chest pain since admission.  Echo done.  Stress test pending.  ? D/c if low risk?  Cardiology consult appreciated Eulogio Bear DO

## 2019-06-23 NOTE — Progress Notes (Signed)
  Echocardiogram 2D Echocardiogram has been performed.  Beverly [name] 06/23/2019, 10:02 AM

## 2019-06-23 NOTE — Consult Note (Signed)
Cardiology Consultation:   Patient ID: Beverly [name] MRN: [id number]; DOB: 1946/02/14  Admit date: 06/22/2019 Date of Consult: 06/23/2019  Primary Care Provider: Rita Ohara, MD Primary Cardiologist: [name], MD  Primary Electrophysiologist:  None    Patient Profile:   Beverly [name] is a 74 y.o. female with a hx of HLD, sinus bradycardia, GERD, Osteoarthritis, chronic cough being worked up by pulmonology, cardiac history in her father, mild sleep apnea using oral device, asthma, h/o of amaurosis fugax in2016, brief runs of SVT on heart monitor in 2017 who is being seen today for the evaluation of chest pain at the request of Dr, Eliseo Squires.  History of Present Illness:   [name] has seen Dr. Johnsie Cancel in the past.  She had amaurosis fugax in 2016.  Echo at that time showed preserved EF and normal carotid ultrasound.  She wore a heart monitor 08/2015 which showed brief runs of SVT and sinus bradycardia with first-degree AV block and incomplete RBBB.  06/2016 patient was seen in the office for chest pain.  Lexiscan stress test was ordered which showed EF of 69%, low risk study and normal perfusion.  Patient was last seen 09/23/2016 by Dr. Johnsie Cancel and was doing well with no cardiac complaints. Patient takes aspirin for amaurosis fugax. Since her initial episode in 2016 she had 2 more, the most recent in June 2020 (work-up at [facility]).   The patient presented to the ED 06/22/2019 for chest pain.Patient was working at her desk when she had a gradual onset of chest pressure. It was on the right side and radiated up into her neck . It was 6/10. She felt mildly sob. No N/V or diaphoresis. The pain lasted about 10 minutes before resolving on it's own. She works across the road from the ED so she walked over for further evaluation. Denies that walking made the pain worse. Patient has chronic productive cough for the last 3 years for which she recently saw Dr. Shearon Stalls with pulmonology. Denies that coughing or  taking a deep breath increases the pain. Denies recent fever, chills, illness. No LLE or orthopnea.   In the ED blood pressure 139/72, pulse 52, afebrile, respiratory rate 12, 94% O2. Labs showed potassium 3.9, creatinine 0.69, glucose 94, WBC 10, hemoglobin 14.1. HS trop 4>6.  Covid negative.  Chest x-ray unremarkable. EKG showed NSR with nonspecific T wave changes.  Patient was given full dose aspirin and admitted for further work-up.  Patient denies tobacco and drug use. She drinks 1 glass of wine at night. Patient denies recurrent chest pain since being in the hospital.     Past Medical History:  Diagnosis Date  . Allergy   . Amaurosis fugax    negative w/u through [facility]  . Asthma   . Back pain    Dr Joline Salt 02/2010-epidural injection x 2 at L4-5 with good effect  . 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 BCC (basal cell carcinoma of skin) 05/09/2014   mid lower back  . BCC (basal cell carcinoma of skin) 05/03/2017   right low back  . BCC (basal cell carcinoma of skin) 05/03/2017   left upper back  . BCC (basal cell carcinoma of skin) 07/05/2018   left mid back  . BCC (basal cell carcinoma of skin) 05/20/1992   upper back  . BCC (basal cell carcinoma of skin) 07/29/1993   left sholder medial  . BCC (basal cell carcinoma of skin) 07/29/1993   left sholder lateral  .  BCC (basal cell carcinoma of skin) 07/29/1993   right thigh  . BCC (basal cell carcinoma of skin) 07/29/1993   right sholder  . BCC (basal cell carcinoma of skin) 12/22/1994   right mid forearm  . BCC (basal cell carcinoma of skin) 12/22/1994   right upper forearm  . BCC (basal cell carcinoma of skin) 12/22/1994   lower right upper arm  . BCC (basal cell carcinoma of skin) 12/22/1994   right upper arm sholder  . BCC (basal cell carcinoma of skin) 08/11/1995   left leg below knee  . BCC (basal cell carcinoma of skin) 04/11/2002   mid back  . BCC (basal cell carcinoma of skin) 12/10/2002    right center upper back  . BCC (basal cell carcinoma of skin) 05/26/2005   right post sholder  . BCC (basal cell carcinoma) 05/09/2014   left inner shin  . BCC (basal cell carcinoma) 06/12/2014   left forearm  . Bowen's disease 10/07/1994   right post knee, right inner forearm/wrist  . Bowen's disease 08/11/1995   left sholder  . Cataract    left  . Chronic pain   . Depression   . Diverticulosis of colon 1998   mild  . DJD (degenerative joint disease)   . Duodenal ulcer    h/o  . GERD (gastroesophageal reflux disease)   . History of SCC (squamous cell carcinoma) of skin    Dr. Denna Haggard  . History of sinus bradycardia   . HLD (hyperlipidemia)    hypertriglyceridemia  . Hypertensive retinopathy of both eyes 11/18/2017  . IBS (irritable bowel syndrome)   . Internal hemorrhoids 1998  . Mitral regurgitation    mild  . Osteoarthritis    feet,shoulder,neck,back,hips and hands.  . Pneumonia   . PONV (postoperative nausea and vomiting)   . Rotator cuff tear, right 02/2019   infraspinatus and supraspinatus, and dislocation of long head of bicep tendon (Dr. Alvan Dame)  . SCC (squamous cell carcinoma) 11/26/2014   left hand, right hand, right deltoid  . SCC (squamous cell carcinoma) 05/03/2017   left cheek  . Sleep apnea    uses a mouth guard nightly  . Squamous cell carcinoma in situ (SCCIS) 07/05/2018   left hand  . Vitamin D deficiency    mild    Past Surgical History:  Procedure Laterality Date  . ABDOMINAL HYSTERECTOMY    . BLEPHAROPLASTY Bilateral 01/2018  . BROW LIFT Bilateral 10/10/2018   Procedure: BROW LIFT;  Surgeon: Wallace Going, DO;  Location: [facility];  Service: Plastics;  Laterality: Bilateral;  . BUNIONECTOMY     R 12/08, L 2004 (Dr. Janus Molder)  . CARPAL TUNNEL RELEASE  1989   bilateral  . CATARACT EXTRACTION Bilateral Left in 08/2012, Right 04/2016   Dr.Hecker  . CESAREAN SECTION     x2  . CHOLECYSTECTOMY  1992  . CLOSED  REDUCTION NASAL FRACTURE N/A 08/29/2018   Procedure: CLOSED REDUCTION NASAL FRACTURE;  Surgeon: Wallace Going, DO;  Location: [facility];  Service: Plastics;  Laterality: N/A;  1 hour, please  . COLONOSCOPY  2006  . COLONOSCOPY  01/2012   due again 01/2022; mild diverticulosis  . epidural steroid injection, back  02/2010  . HIP SURGERY     right bursectomy x 3  . HIP SURGERY Right 2000   torn cartilage, repaired  . INGUINAL HERNIA REPAIR  9/09   bilat  . NECK SURGERY  1989   c6-7 cervical laminectomy  and diskecotmy  . NECK SURGERY  1989   per patient posterior area of neck  . REVERSE SHOULDER ARTHROPLASTY Right 04/11/2019   Procedure: REVERSE SHOULDER ARTHROPLASTY;  Surgeon: Nicholes Stairs, MD;  Location: [facility];  Service: Orthopedics;  Laterality: Right;  Regional Block  . SHOULDER SURGERY  2/05   left rotator cuff repair  . SHOULDER SURGERY Left 03/14/2018   rotator cuff repair; Dr. Tonita Cong  . TONSILLECTOMY  age 72  . TOTAL ABDOMINAL HYSTERECTOMY W/ BILATERAL SALPINGOOPHORECTOMY  1997   fibroids  . TROCHANTERIC BURSA EXCISION Right 1978  . TUBAL LIGATION  1986  . WISDOM TOOTH EXTRACTION       Home Medications:  Prior to Admission medications   Medication Sig Start Date End Date Taking? Authorizing Provider  acetaminophen (TYLENOL) 500 MG tablet Take 1,000 mg by mouth every 6 (six) hours as needed for moderate pain.    Yes [provider]  albuterol (PROVENTIL HFA;VENTOLIN HFA) 108 (90 Base) MCG/ACT inhaler Inhale 2 puffs into the lungs every 6 (six) hours as needed for wheezing or shortness of breath. 07/02/16  Yes Rita Ohara, MD  amoxicillin (AMOXIL) 500 MG tablet Take 2,000 mg by mouth as directed. For dental appointments   Yes [provider]  aspirin EC 81 MG tablet Take 81 mg by mouth daily.   Yes [provider]  atorvastatin (LIPITOR) 40 MG tablet Take 1 tablet (40 mg total) by mouth daily. 01/16/19  Yes Rita Ohara, MD   Calcium Carbonate-Vitamin D (CALCIUM 600+D) 600-200 MG-UNIT TABS Take 1 tablet by mouth 2 (two) times daily.     Yes [provider]  celecoxib (CELEBREX) 200 MG capsule Take 1 capsule (200 mg total) by mouth 2 (two) times daily. 01/16/19  Yes Rita Ohara, MD  cetirizine (ZYRTEC) 10 MG tablet Take 10 mg by mouth daily.   Yes [provider]  diclofenac Sodium (VOLTAREN) 1 % GEL Apply 2 g topically 4 (four) times daily as needed (joint pain).  12/29/18  Yes [provider]  esomeprazole (NEXIUM) 40 MG capsule TAKE 1 CAPSULE BY MOUTH DAILY BEFORE SUPPER. 06/15/19  Yes Rita Ohara, MD  fentaNYL (DURAGESIC - DOSED MCG/HR) 25 MCG/HR Place 1 patch onto the skin every 3 (three) days.     Yes [provider]  fluticasone (FLONASE) 50 MCG/ACT nasal spray PLACE 2 SPRAYS INTO THE NOSE DAILY. Patient taking differently: Place 2 sprays into both nostrils in the morning and at bedtime.  07/13/18  Yes Rita Ohara, MD  fluticasone (FLOVENT HFA) 220 MCG/ACT inhaler INHALE 2 PUFFS INTO THE LUNGS 2 (TWO) TIMES DAILY. Patient taking differently: Inhale 2 puffs into the lungs in the morning and at bedtime.  07/13/18  Yes Rita Ohara, MD  gabapentin (NEURONTIN) 800 MG tablet Take 800 mg by mouth at bedtime.    Yes [provider]  GUAIFENESIN ER PO Take 400 mg by mouth 2 (two) times daily.    Yes [provider]  HYDROcodone-acetaminophen (NORCO) 7.5-325 MG tablet Take 1 tablet by mouth every 12 (twelve) hours as needed for moderate pain.  10/06/18  Yes [provider]  loperamide (IMODIUM A-D) 2 MG tablet Take 2 mg by mouth 4 (four) times daily as needed for diarrhea or loose stools.   Yes [provider]  methocarbamol (ROBAXIN) 500 MG tablet Take 500 mg by mouth in the morning and at bedtime.    Yes [provider]  montelukast (SINGULAIR) 10 MG tablet Take 1  tablet (10 mg total) by mouth at bedtime. 07/13/18  Yes Rita Ohara, MD  Multiple  Vitamins-Minerals (MULTIVITAMIN WITH MINERALS) tablet Take 1 tablet by mouth daily.   Yes [provider]  niacinamide 500 MG tablet Take 500 mg by mouth 2 (two) times daily with a meal.   Yes [provider]  omega-3 acid ethyl esters (LOVAZA) 1 g capsule TAKE 2 CAPSULES BY MOUTH 2 TIMES DAILY. Patient taking differently: Take 2 g by mouth 2 (two) times daily.  03/03/19  Yes Rita Ohara, MD  PARoxetine (PAXIL) 30 MG tablet Take 60 mg by mouth daily.  07/17/14  Yes [provider]  Azelastine HCl 137 MCG/SPRAY SOLN Place 2 Pump into both nostrils 2 (two) times a day. Patient not taking: Reported on 06/22/2019 07/13/18   Rita Ohara, MD  estradiol (ESTRACE) 0.5 MG tablet TAKE 1 TABLET BY MOUTH ONCE DAILY Patient not taking: No sig reported 02/14/18   Rita Ohara, MD  Fluorouracil (TOLAK) 4 % CREA Apply 1 application topically every 3 (three) days.     [provider]  loratadine (CLARITIN) 10 MG tablet Take 10 mg by mouth daily.    [provider]    Inpatient Medications: Scheduled Meds: . aspirin EC  81 mg Oral Daily  . atorvastatin  40 mg Oral QHS  . enoxaparin (LOVENOX) injection  40 mg Subcutaneous Q24H  . fentaNYL  1 patch Transdermal Q72H  . fluticasone  2 spray Each Nare BID  . gabapentin  800 mg Oral QHS  . guaiFENesin  600 mg Oral BID  . loratadine  10 mg Oral Daily  . montelukast  10 mg Oral QHS  . pantoprazole  40 mg Oral QAC supper  . PARoxetine  60 mg Oral Daily  . sodium chloride flush  3 mL Intravenous Once   Continuous Infusions:  PRN Meds: acetaminophen, diclofenac Sodium, HYDROcodone-acetaminophen, loperamide, methocarbamol, ondansetron (ZOFRAN) IV  Allergies:    Allergies  Allergen Reactions  . Adhesive [Tape] Rash  . Codeine Rash    Social History:   Social History   Socioeconomic History  . Marital status: Divorced    Spouse name: Not on file  . Number of children: 2  . Years of education: Not on file  . Highest  education level: Not on file  Occupational History  . Occupation: STAFF Optician, dispensing: [employer]  Tobacco Use  . Smoking status: Never Smoker  . Smokeless tobacco: Never Used  Substance and Sexual Activity  . Alcohol use: Yes    Alcohol/week: 7.0 standard drinks    Types: 7 Glasses of wine per week    Comment: 1 glass of wine daily  . Drug use: No  . Sexual activity: Not Currently  Other Topics Concern  . 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   [place] for [employer] part-time   Social Determinants of Health   Financial Resource Strain:   . Difficulty of Paying Living Expenses:   Food Insecurity:   . Worried About Charity fundraiser in the Last Year:   . Arboriculturist in the Last Year:   Transportation Needs:   . Film/video editor (Medical):   Marland Kitchen Lack of Transportation (Non-Medical):   Physical Activity:   . Days of Exercise  per Week:   . Minutes of Exercise per Session:   Stress:   . Feeling of Stress :   Social Connections:   . Frequency of Communication with Friends and Family:   . Frequency of Social Gatherings with Friends and Family:   . Attends Religious Services:   . Active Member of Clubs or Organizations:   . Attends Archivist Meetings:   Marland Kitchen Marital Status:   Intimate Partner Violence:   . Fear of Current or Ex-Partner:   . Emotionally Abused:   Marland Kitchen Physically Abused:   . Sexually Abused:     Family History:   Family History  Problem Relation Age of Onset  . Depression Mother   . Schizophrenia Mother   . Cancer Father        oral  . Heart disease Father        bradycardia  . Cancer Sister        skin and lung  . COPD Sister   . Hypertension Sister   . Osteoporosis Sister        compression fx's x 3 11/2018  .  Hyperthyroidism Brother   . Cancer Brother        metastatic cancer to bone (?primary)  . Hyperlipidemia Daughter   . Asthma Son   . Cancer Paternal Grandmother 47       colon cancer, metastatic to liver  . Colon cancer Paternal Grandmother   . Diabetes Maternal Grandmother   . ADD / ADHD Other      ROS:  Please see the history of present illness.  All other ROS reviewed and negative.     Physical Exam/Data:   Vitals:   06/22/19 1530 06/22/19 1829 06/22/19 2348 06/23/19 0549  BP:  (!) 132/59 (!) 128/57 137/62  Pulse:  (!) 53 (!) 55 (!) 54  Resp:  16 17 16   Temp: 98.2 F (36.8 C) 98 F (36.7 C) 98.1 F (36.7 C) 97.8 F (36.6 C)  TempSrc: Oral Oral Oral Oral  SpO2:  95% 93% 96%  Weight:      Height:        Intake/Output Summary (Last 24 hours) at 06/23/2019 0941 Last data filed at 06/23/2019 0300 Gross per 24 hour  Intake 480 ml  Output --  Net 480 ml   Last 3 Weights 06/22/2019 06/01/2019 04/11/2019  Weight (lbs) 162 lb 11.2 oz 162 lb 12.8 oz 170 lb 9.6 oz  Weight (kg) 73.8 kg 73.846 kg 77.384 kg     Body mass index is 27.07 kg/m.  General:  Well nourished, well developed, in no acute distress HEENT: normal Lymph: no adenopathy Neck: no JVD Endocrine:  No thryomegaly Vascular: No carotid bruits; FA pulses 2+ bilaterally without bruits  Cardiac:  normal S1, S2; RRR; no murmur  Lungs:  clear to auscultation bilaterally, no wheezing, rhonchi or rales  Abd: soft, nontender, no hepatomegaly  Ext: no edema Musculoskeletal:  No deformities, BUE and BLE strength normal and equal Skin: warm and dry  Neuro:  CNs 2-12 intact, no focal abnormalities noted Psych:  Normal affect   EKG:  The EKG was personally reviewed and demonstrates:  NSR with 1st dgree AV block, nonspecific Twave changes Telemetry:  Telemetry was personally reviewed and demonstrates:  Sinus bradycardia with peak low in the high 40s, rates int he 50s, first degree AV block with PRI 0.23s  Relevant CV  Studies:  Echo ordered  Echo 2016 Study Conclusions   - Left ventricle:  The cavity size was normal. Systolic function was  normal. The estimated ejection fraction was in the range of 55%  to 60%. Wall motion was normal; there were no regional wall  motion abnormalities.  - Mitral valve: There was mild regurgitation.  - Pericardium, extracardiac: A trivial pericardial effusion was  identified.   Stress test, Myoview 06/2016  Nuclear stress EF: 69%.  The study is normal.  This is a low risk study.  Blood pressure demonstrated a normal response to exercise.  There was no ST segment deviation noted during stress.  No T wave inversion was noted during stress.   Low risk stress nuclear study with normal perfusion and normal left ventricular regional and global systolic function.  Laboratory Data:  High Sensitivity Troponin:   Recent Labs  Lab 06/22/19 0917 06/22/19 1115  TROPONINIHS 4 6     Chemistry Recent Labs  Lab 06/22/19 0917  NA 142  K 3.9  CL 106  CO2 28  GLUCOSE 94  BUN 15  CREATININE 0.69  CALCIUM 9.7  GFRNONAA >60  GFRAA >60  ANIONGAP 8    No results for input(s): PROT, ALBUMIN, AST, ALT, ALKPHOS, BILITOT in the last 168 hours. Hematology Recent Labs  Lab 06/22/19 0917  WBC 10.0  RBC 4.39  HGB 14.1  HCT 44.8  MCV 102.1*  MCH 32.1  MCHC 31.5  RDW 13.9  PLT 208   BNPNo results for input(s): BNP, PROBNP in the last 168 hours.  DDimer No results for input(s): DDIMER in the last 168 hours.   Radiology/Studies:  DG Chest 2 View  Result Date: 06/22/2019 CLINICAL DATA:  Right chest pain EXAM: CHEST - 2 VIEW COMPARISON:  09/01/2017 FINDINGS: The lungs are clear without focal pneumonia, edema, pneumothorax or pleural effusion. The cardiopericardial silhouette is within normal limits for size. The visualized bony structures of the thorax are intact. Surgical changes noted in both shoulders. IMPRESSION: No active cardiopulmonary disease.  Electronically Signed   By: Misty [name] M.D.   On: 06/22/2019 09:35   CT Chest High Resolution  Result Date: 06/20/2019 CLINICAL DATA:  74 year old female with history of interstitial lung disease. Evaluate for bronchiectasis. EXAM: CT CHEST WITHOUT CONTRAST TECHNIQUE: Multidetector CT imaging of the chest was performed following the standard protocol without intravenous contrast. High resolution imaging of the lungs, as well as inspiratory and expiratory imaging, was performed. COMPARISON:  No priors. FINDINGS: Cardiovascular: Heart size is normal. There is no significant pericardial fluid, thickening or pericardial calcification. There is aortic atherosclerosis, as well as atherosclerosis of the great vessels of the mediastinum and the coronary arteries, including calcified atherosclerotic plaque in the left main and left anterior descending coronary arteries. Mediastinum/Nodes: No pathologically enlarged mediastinal or hilar lymph nodes. Please note that accurate exclusion of hilar adenopathy is limited on noncontrast CT scans. Esophagus is unremarkable in appearance. No axillary lymphadenopathy. Lungs/Pleura: High-resolution images demonstrates some patchy areas of ground-glass attenuation, peripheral predominant septal thickening and mild cylindrical bronchiectasis noted throughout the lungs bilaterally, most evident in the mid to lower lungs. No honeycombing. Inspiratory and expiratory imaging demonstrates moderate air trapping indicative of small airways disease. There is also extensive collapse of the trachea and mainstem bronchi. Upper Abdomen: Aortic atherosclerosis. Musculoskeletal: There are no aggressive appearing lytic or blastic lesions noted in the visualized portions of the skeleton. IMPRESSION: 1. There is a spectrum of findings compatible with interstitial lung disease, categorized as probable usual interstitial pneumonia (UIP) per current ATS guidelines. However, there is lack  of overt  honeycombing, and moderate air trapping which is unusual for usual interstitial pneumonia. The possibility of nonspecific interstitial pneumonia (NSIP) should be considered. 2. Severe tracheobronchomalacia. 3. Aortic atherosclerosis, in addition to left main and left anterior descending coronary artery disease. Assessment for potential risk factor modification, dietary therapy or pharmacologic therapy may be warranted, if clinically indicated. Aortic Atherosclerosis (ICD10-I70.0). Electronically Signed   By: Vinnie Langton M.D.   On: 06/20/2019 16:23   {  HEAR Score (for undifferentiated chest pain):  HEAR Score: 5    Assessment and Plan:   Chest pain - presents with right sided chest pressure that radiated into the jaw with mild sob -No prior history of CAD although does have risk factors including hyperlipidemia and family history - she takes aspirin for h/o of amaurosis fugax -Patient had a stress test in 2018 which was low risk with normal perfusion -Echo ordered.  Echo in 2016 showed EF of 35 to 60% with no wall motion abnormalities and mild MR with trivial pericardial effusion. - continue statin - Patient has not had recurrent chest pain -Given negative trops and stable EKG would consider cardiac CT. HR in the 50s. MD to see  Hyperlipidemia -Atorvastatin 40 mg and fish oil - LDL 73, HDL49, TG 197  Chronic cough - ILD vs bronchiectasis - follows with pulmonology outpatient.    -Per internal medicine  Chronic pain and opioid use -celebrex, fentanyl and hydrocodone    For questions or updates, please contact [place] Please consult [email or web address] for contact info under     Signed, Jelani Vreeland Ninfa Meeker, PA-C  06/23/2019 9:41 AM

## 2019-06-24 ENCOUNTER — Observation Stay (HOSPITAL_BASED_OUTPATIENT_CLINIC_OR_DEPARTMENT_OTHER): Payer: Medicare PPO

## 2019-06-24 DIAGNOSIS — K219 Gastro-esophageal reflux disease without esophagitis: Secondary | ICD-10-CM | POA: Diagnosis not present

## 2019-06-24 DIAGNOSIS — J849 Interstitial pulmonary disease, unspecified: Secondary | ICD-10-CM | POA: Diagnosis not present

## 2019-06-24 DIAGNOSIS — R079 Chest pain, unspecified: Secondary | ICD-10-CM | POA: Diagnosis not present

## 2019-06-24 DIAGNOSIS — E782 Mixed hyperlipidemia: Secondary | ICD-10-CM | POA: Diagnosis not present

## 2019-06-24 LAB — NM MYOCAR MULTI W/SPECT W/WALL MOTION / EF
Estimated workload: 1 METS
Exercise duration (min): 7 min
Exercise duration (sec): 16 s
MPHR: 149 {beats}/min
Peak HR: 78 {beats}/min
Percent HR: 52 %
Rest HR: 53 {beats}/min

## 2019-06-24 MED ORDER — TECHNETIUM TC 99M TETROFOSMIN IV KIT
30.0000 | PACK | Freq: Once | INTRAVENOUS | Status: AC | PRN
  Administered 2019-06-24: 30 via INTRAVENOUS

## 2019-06-24 MED ORDER — FLUTICASONE PROPIONATE 50 MCG/ACT NA SUSP: 2.0000 | Freq: Two times a day (BID) | NASAL | Status: DC

## 2019-06-24 MED ORDER — REGADENOSON 0.4 MG/5ML IV SOLN
INTRAVENOUS | Status: AC
  Administered 2019-06-24: 10:00:00 0.4 mg via INTRAVENOUS
  Filled 2019-06-24: qty 5

## 2019-06-24 MED ORDER — REGADENOSON 0.4 MG/5ML IV SOLN
0.4000 mg | Freq: Once | INTRAVENOUS | Status: AC
  Filled 2019-06-24: qty 5

## 2019-06-24 MED ORDER — TECHNETIUM TC 99M TETROFOSMIN IV KIT
10.0000 | PACK | Freq: Once | INTRAVENOUS | Status: AC | PRN
  Administered 2019-06-24: 10 via INTRAVENOUS

## 2019-06-24 NOTE — Progress Notes (Signed)
Pt discharge education provided at bedside  Pt IV removed by NT, catheter intact and telemetry removed  Pt has all belongings  Pt denies wheelchair, pt ambulated off unit with NT

## 2019-06-24 NOTE — Progress Notes (Signed)
   Beverly [name] presented for a nuclear stress test today.  No immediate complications.  Stress imaging is pending at this time.  Preliminary EKG findings may be listed in the chart, but the stress test result will not be finalized until perfusion imaging is complete.  1 day study, [place] to read.  Rosaria Ferries, PA-C 06/24/2019, 10:34 AM

## 2019-06-24 NOTE — Progress Notes (Addendum)
Progress Note  Patient Name: Beverly [name] Date of Encounter: 06/24/2019  Primary Cardiologist: [name], MD   Subjective   No CP since admit, no SOB  Inpatient Medications    Scheduled Meds: . aspirin EC  81 mg Oral Daily  . atorvastatin  40 mg Oral QHS  . enoxaparin (LOVENOX) injection  40 mg Subcutaneous Q24H  . fentaNYL  1 patch Transdermal Q72H  . fluticasone  2 spray Each Nare BID  . gabapentin  800 mg Oral QHS  . guaiFENesin  600 mg Oral BID  . loratadine  10 mg Oral Daily  . montelukast  10 mg Oral QHS  . pantoprazole  40 mg Oral QAC supper  . PARoxetine  60 mg Oral Daily  . regadenoson  0.4 mg Intravenous Once  . sodium chloride flush  3 mL Intravenous Once   Continuous Infusions:  PRN Meds: acetaminophen, diclofenac Sodium, HYDROcodone-acetaminophen, loperamide, methocarbamol, ondansetron (ZOFRAN) IV   Vital Signs    Vitals:   06/23/19 2339 06/24/19 0520 06/24/19 0810 06/24/19 0923  BP: 128/60 112/61 123/60 139/80  Pulse: 61 62 60   Resp: 18 18    Temp: 98.1 F (36.7 C) 98.1 F (36.7 C)    TempSrc: Oral Oral    SpO2: 100% 96%    Weight:      Height:        Intake/Output Summary (Last 24 hours) at 06/24/2019 0955 Last data filed at 06/24/2019 0810 Gross per 24 hour  Intake 240 ml  Output --  Net 240 ml   Last 3 Weights 06/22/2019 06/01/2019 04/11/2019  Weight (lbs) 162 lb 11.2 oz 162 lb 12.8 oz 170 lb 9.6 oz  Weight (kg) 73.8 kg 73.846 kg 77.384 kg      Telemetry    SR, seen in nuc med - Personally Reviewed  ECG    05/08 ECG is SR, HR 58, no acute ischemic changes - Personally Reviewed  Physical Exam   GEN: No acute distress.   Neck: No JVD Cardiac: RRR, no murmurs, rubs, or gallops.  Respiratory: Clear to auscultation bilaterally. GI: Soft, nontender, non-distended  MS: No edema; No deformity. Neuro:  Nonfocal  Psych: Normal affect   Labs    High Sensitivity Troponin:   Recent Labs  Lab 06/22/19 0917 06/22/19 1115   TROPONINIHS 4 6      Cardiac EnzymesNo results for input(s): TROPONINI in the last 168 hours. No results for input(s): TROPIPOC in the last 168 hours.   Chemistry Recent Labs  Lab 06/22/19 0917  NA 142  K 3.9  CL 106  CO2 28  GLUCOSE 94  BUN 15  CREATININE 0.69  CALCIUM 9.7  GFRNONAA >60  GFRAA >60  ANIONGAP 8     Hematology Recent Labs  Lab 06/22/19 0917  WBC 10.0  RBC 4.39  HGB 14.1  HCT 44.8  MCV 102.1*  MCH 32.1  MCHC 31.5  RDW 13.9  PLT 208    BNPNo results for input(s): BNP, PROBNP in the last 168 hours.   Lab Results  Component Value Date   CHOL 161 06/22/2019   HDL 49 06/22/2019   LDLCALC 73 06/22/2019   TRIG 197 (H) 06/22/2019   CHOLHDL 3.3 06/22/2019   Lab Results  Component Value Date   TSH 1.820 12/09/2017   No results found for: HGBA1C    Radiology    ECHOCARDIOGRAM COMPLETE  Result Date: 06/23/2019    ECHOCARDIOGRAM REPORT   Patient Name:  [name] Date of Exam: 06/23/2019 Medical Rec #:  [id number]         Height:       65.0 in Accession #:    [id number]        Weight:       162.7 lb Date of Birth:  [birth year]        BSA:          1.812 m Patient Age:    73 years          BP:           137/62 mmHg Patient Gender: F                 HR:           54 bpm. Exam Location:  Inpatient Procedure: 2D Echo Indications:    chest pain  History:        Patient has prior history of Echocardiogram examinations, most                 recent 01/17/2015. Risk Factors:Dyslipidemia.  Sonographer:    Jannett Celestine RDCS (AE) Referring Phys: [phone] [facility] A SM[facility]  Sonographer Comments: challenging apical windows IMPRESSIONS  1. Left ventricular ejection fraction, by estimation, is 55 to 60%. The left ventricle has normal function. The left ventricle has no regional wall motion abnormalities. There is mild left ventricular hypertrophy. Left ventricular diastolic function could not be evaluated.  2. Right ventricular systolic function is normal. The right  ventricular size is normal.  3. The mitral valve is grossly normal. Trivial mitral valve regurgitation. No evidence of mitral stenosis.  4. The aortic valve has an indeterminant number of cusps. Aortic valve regurgitation is not visualized. Mild aortic valve sclerosis is present, with no evidence of aortic valve stenosis. FINDINGS  Left Ventricle: Left ventricular ejection fraction, by estimation, is 55 to 60%. The left ventricle has normal function. The left ventricle has no regional wall motion abnormalities. The left ventricular internal cavity size was normal in size. There is  mild left ventricular hypertrophy. Left ventricular diastolic function could not be evaluated. Right Ventricle: The right ventricular size is normal. No increase in right ventricular wall thickness. Right ventricular systolic function is normal. Left Atrium: Left atrial size was normal in size. Right Atrium: Right atrial size was normal in size. Pericardium: Trivial pericardial effusion is present. Mitral Valve: The mitral valve is grossly normal. Trivial mitral valve regurgitation. No evidence of mitral valve stenosis. Tricuspid Valve: The tricuspid valve is grossly normal. Tricuspid valve regurgitation is trivial. No evidence of tricuspid stenosis. Aortic Valve: The aortic valve has an indeterminant number of cusps. Aortic valve regurgitation is not visualized. Mild aortic valve sclerosis is present, with no evidence of aortic valve stenosis. There is mild calcification of the aortic valve. Pulmonic Valve: The pulmonic valve was not well visualized. Pulmonic valve regurgitation is not visualized. No evidence of pulmonic stenosis. Aorta: The aortic arch was not well visualized and the ascending aorta was not well visualized. IAS/Shunts: The atrial septum is grossly normal.  LEFT VENTRICLE PLAX 2D LVIDd:         2.80 cm  Diastology LVIDs:         2.00 cm  LV e' lateral:   6.85 cm/s LV PW:         1.20 cm  LV E/e' lateral: 10.5 LV IVS:         1.50 cm LVOT diam:     1.80  cm LV SV:         60 LV SV Index:   33 LVOT Area:     2.54 cm  LEFT ATRIUM             Index LA diam:        2.50 cm 1.38 cm/m LA Vol (A2C):   50.0 ml 27.59 ml/m LA Vol (A4C):   26.5 ml 14.62 ml/m LA Biplane Vol: 36.2 ml 19.98 ml/m  AORTIC VALVE LVOT Vmax:   90.70 cm/s LVOT Vmean:  70.500 cm/s LVOT VTI:    0.235 m  AORTA Ao Root diam: 2.60 cm MITRAL VALVE MV Area (PHT): 2.87 cm    SHUNTS MV Decel Time: 264 msec    Systemic VTI:  0.24 m MV E velocity: 72.00 cm/s  Systemic Diam: 1.80 cm MV A velocity: 61.30 cm/s MV E/A ratio:  1.17 Buford Dresser MD Electronically signed by Buford Dresser MD Signature Date/Time: 06/23/2019/12:20:30 PM    Final     Cardiac Studies   ECHO: 06/24/2019 1. Left ventricular ejection fraction, by estimation, is 55 to 60%. The  left ventricle has normal function. The left ventricle has no regional  wall motion abnormalities. There is mild left ventricular hypertrophy.  Left ventricular diastolic function  could not be evaluated.  2. Right ventricular systolic function is normal. The right ventricular  size is normal.  3. The mitral valve is grossly normal. Trivial mitral valve  regurgitation. No evidence of mitral stenosis.  4. The aortic valve has an indeterminant number of cusps. Aortic valve  regurgitation is not visualized. Mild aortic valve sclerosis is present,  with no evidence of aortic valve stenosis.   Patient Profile     74 y.o. female w/ hx HLD, sinus brady, GERD, cough, mild OSA, amaurosis fugax 2016, SVT was admitted 05/06 w/ CP.  Assessment & Plan    1. Chest pain - ez neg MI, ECG not acute, echo w/ nl EF and no WMA - for MV today - If negative, no further cardiac workup  For questions or updates, please contact [facility] Please consult [email or web address] for contact info under      Signed, Rosaria Ferries, PA-C  06/24/2019, 9:55 AM    PA Barret's rounding note reviewed, Dr Blenda Mounts initial  consult note reviewed. Presented with atypical symptoms. Enzymes negative. Echo LVEF 55-60%, nuclear stress without ischemia. Negative extensive cardiac workup, no further testing planned, we will sign off inpatient care   Carlyle Dolly MD

## 2019-06-24 NOTE — Discharge Summary (Signed)
Physician Discharge Summary  CAPITOLA MCCARTIN [id number] DOB: [birth year] DOA: 06/22/2019  PCP: Rita Ohara, MD  Admit date: 06/22/2019 Discharge date: 06/24/2019  Admitted From: Home Discharge disposition: Home   Recommendations for Outpatient Follow-Up:   1. Investigate other causes of chest pain as cardiac work-up has been unrevealing   Discharge Diagnosis:   Principal Problem:   Chest pain Active Problems:   GERD (gastroesophageal reflux disease)   Mixed hyperlipidemia   Interstitial lung disease ([place])    Discharge Condition: Improved.  Diet recommendation:Regular.  Wound care: None.  Code status: Full.   History of Present Illness:   Beverly [name] is a 74 y.o. female with medical history significant of HLD, interstitial lung disease, amaurosis fugax with negative work-up, anxiety, depression, and chronic pain who presented with complaints of chest pain.  She was sitting down at the time when she reported having acute onset of substernal pressure-like pain that thereafter radiated up the right side of her neck into her jaw.  Notes that she has never had a pain like this before.  She reports that she may have felt a little short of breath.  Denied having any diaphoresis, nausea, vomiting, abdominal pain, lightheadedness, calf pain, or palpitations.  She has lower extremity swelling, cough, and intermittent shortness of breath that she reports is more so chronic.  Records note a low-grade stress test performed last in 2018.  Her last echocardiogram from 01/2015 noted EF of 55 to 60%.  Her pulmonologist Dr. Shearon Stalls had checked a CT of her chest due to history of chronic cough and asthma just 2 days ago.   Hospital Course by Problem:   Chest discomfort -Presented with atypical symptoms. Enzymes negative. Echo LVEF 55-60%, nuclear stress without ischemia. Negative extensive cardiac workup, no further testing planned cardiology  Interstitial lung disease: Patient  notes having a long history of cough. Being followed currently by Dr. Shearon Stalls of pulmonology suspected to have bronchiectasis. -Continue outpatient follow-up  Anxiety and depression -Continue Paxil  Chronicpain andopioid use: Patient on fentanyl patch as well as hydrocodone every 12 hours as needed for pain. -Continue current home regimen  Dyslipidemia -Follow-up lipid panel -Continue atorvastatin  GERD -Protonix   Medical Consultants:   Cardiology   Discharge Exam:   Vitals:   06/24/19 1009 06/24/19 1208  BP: (!) 154/66 120/60  Pulse:  (!) 56  Resp:  18  Temp:  98.1 F (36.7 C)  SpO2:  96%   Vitals:   06/24/19 1007 06/24/19 1009 06/24/19 1101 06/24/19 1208  BP: (!) 165/66 (!) 154/66  120/60  Pulse:    (!) 56  Resp:    18  Temp:    98.1 F (36.7 C)  TempSrc:    Oral  SpO2:    96%  Weight:   74.8 kg   Height:        General exam: Appears calm and comfortable.  The results of significant diagnostics from this hospitalization (including imaging, microbiology, ancillary and laboratory) are listed below for reference.     Procedures and Diagnostic Studies:   DG Chest 2 View  Result Date: 06/22/2019 CLINICAL DATA:  Right chest pain EXAM: CHEST - 2 VIEW COMPARISON:  09/01/2017 FINDINGS: The lungs are clear without focal pneumonia, edema, pneumothorax or pleural effusion. The cardiopericardial silhouette is within normal limits for size. The visualized bony structures of the thorax are intact. Surgical changes noted in both shoulders. IMPRESSION: No active cardiopulmonary disease. Electronically Signed  By: Misty [name] M.D.   On: 06/22/2019 09:35   ECHOCARDIOGRAM COMPLETE  Result Date: 06/23/2019    ECHOCARDIOGRAM REPORT   Patient Name:   Beverly [name] Date of Exam: 06/23/2019 Medical Rec #:  [id number]         Height:       65.0 in Accession #:    [id number]        Weight:       162.7 lb Date of Birth:  1945-12-25        BSA:          1.812 m Patient Age:     61 years          BP:           137/62 mmHg Patient Gender: F                 HR:           54 bpm. Exam Location:  Inpatient Procedure: 2D Echo Indications:    chest pain  History:        Patient has prior history of Echocardiogram examinations, most                 recent 01/17/2015. Risk Factors:Dyslipidemia.  Sonographer:    Jannett Celestine RDCS (AE) Referring Phys: [phone] [facility] A SMITH  Sonographer Comments: challenging apical windows IMPRESSIONS  1. Left ventricular ejection fraction, by estimation, is 55 to 60%. The left ventricle has normal function. The left ventricle has no regional wall motion abnormalities. There is mild left ventricular hypertrophy. Left ventricular diastolic function could not be evaluated.  2. Right ventricular systolic function is normal. The right ventricular size is normal.  3. The mitral valve is grossly normal. Trivial mitral valve regurgitation. No evidence of mitral stenosis.  4. The aortic valve has an indeterminant number of cusps. Aortic valve regurgitation is not visualized. Mild aortic valve sclerosis is present, with no evidence of aortic valve stenosis. FINDINGS  Left Ventricle: Left ventricular ejection fraction, by estimation, is 55 to 60%. The left ventricle has normal function. The left ventricle has no regional wall motion abnormalities. The left ventricular internal cavity size was normal in size. There is  mild left ventricular hypertrophy. Left ventricular diastolic function could not be evaluated. Right Ventricle: The right ventricular size is normal. No increase in right ventricular wall thickness. Right ventricular systolic function is normal. Left Atrium: Left atrial size was normal in size. Right Atrium: Right atrial size was normal in size. Pericardium: Trivial pericardial effusion is present. Mitral Valve: The mitral valve is grossly normal. Trivial mitral valve regurgitation. No evidence of mitral valve stenosis. Tricuspid Valve: The tricuspid valve is  grossly normal. Tricuspid valve regurgitation is trivial. No evidence of tricuspid stenosis. Aortic Valve: The aortic valve has an indeterminant number of cusps. Aortic valve regurgitation is not visualized. Mild aortic valve sclerosis is present, with no evidence of aortic valve stenosis. There is mild calcification of the aortic valve. Pulmonic Valve: The pulmonic valve was not well visualized. Pulmonic valve regurgitation is not visualized. No evidence of pulmonic stenosis. Aorta: The aortic arch was not well visualized and the ascending aorta was not well visualized. IAS/Shunts: The atrial septum is grossly normal.  LEFT VENTRICLE PLAX 2D LVIDd:         2.80 cm  Diastology LVIDs:         2.00 cm  LV e' lateral:   6.85 cm/s LV PW:  1.20 cm  LV E/e' lateral: 10.5 LV IVS:        1.50 cm LVOT diam:     1.80 cm LV SV:         60 LV SV Index:   33 LVOT Area:     2.54 cm  LEFT ATRIUM             Index LA diam:        2.50 cm 1.38 cm/m LA Vol (A2C):   50.0 ml 27.59 ml/m LA Vol (A4C):   26.5 ml 14.62 ml/m LA Biplane Vol: 36.2 ml 19.98 ml/m  AORTIC VALVE LVOT Vmax:   90.70 cm/s LVOT Vmean:  70.500 cm/s LVOT VTI:    0.235 m  AORTA Ao Root diam: 2.60 cm MITRAL VALVE MV Area (PHT): 2.87 cm    SHUNTS MV Decel Time: 264 msec    Systemic VTI:  0.24 m MV E velocity: 72.00 cm/s  Systemic Diam: 1.80 cm MV A velocity: 61.30 cm/s MV E/A ratio:  1.17 Buford Dresser MD Electronically signed by Buford Dresser MD Signature Date/Time: 06/23/2019/12:20:30 PM    Final      Labs:   Basic Metabolic Panel: Recent Labs  Lab 06/22/19 0917  NA 142  K 3.9  CL 106  CO2 28  GLUCOSE 94  BUN 15  CREATININE 0.69  CALCIUM 9.7   GFR Estimated Creatinine Clearance: 65.3 mL/min (by C-G formula based on SCr of 0.69 mg/dL). Liver Function Tests: No results for input(s): AST, ALT, ALKPHOS, BILITOT, PROT, ALBUMIN in the last 168 hours. No results for input(s): LIPASE, AMYLASE in the last 168 hours. No results  for input(s): AMMONIA in the last 168 hours. Coagulation profile No results for input(s): INR, PROTIME in the last 168 hours.  CBC: Recent Labs  Lab 06/22/19 0917  WBC 10.0  HGB 14.1  HCT 44.8  MCV 102.1*  PLT 208   Cardiac Enzymes: No results for input(s): CKTOTAL, CKMB, CKMBINDEX, TROPONINI in the last 168 hours. BNP: Invalid input(s): POCBNP CBG: No results for input(s): GLUCAP in the last 168 hours. D-Dimer No results for input(s): DDIMER in the last 72 hours. Hgb A1c No results for input(s): HGBA1C in the last 72 hours. Lipid Profile Recent Labs    06/22/19 1247  CHOL 161  HDL 49  LDLCALC 73  TRIG 197*  CHOLHDL 3.3   Thyroid function studies No results for input(s): TSH, T4TOTAL, T3FREE, THYROIDAB in the last 72 hours.  Invalid input(s): FREET3 Anemia work up No results for input(s): VITAMINB12, FOLATE, FERRITIN, TIBC, IRON, RETICCTPCT in the last 72 hours. Microbiology Recent Results (from the past 240 hour(s))  Respiratory Panel by RT PCR (Flu A&B, Covid) - Nasopharyngeal Swab     Status: None   Collection Time: 06/22/19 11:10 AM   Specimen: Nasopharyngeal Swab  Result Value Ref Range Status   SARS Coronavirus 2 by RT PCR NEGATIVE NEGATIVE Final    Comment: (NOTE) SARS-CoV-2 target nucleic acids are NOT DETECTED. The SARS-CoV-2 RNA is generally detectable in upper respiratoy specimens during the acute phase of infection. The lowest concentration of SARS-CoV-2 viral copies this assay can detect is 131 copies/mL. A negative result does not preclude SARS-Cov-2 infection and should not be used as the sole basis for treatment or other patient management decisions. A negative result may occur with  improper specimen collection/handling, submission of specimen other than nasopharyngeal swab, presence of viral mutation(s) within the areas targeted by this assay, and inadequate number of  viral copies (<131 copies/mL). A negative result must be combined with  clinical observations, patient history, and epidemiological information. The expected result is Negative. Fact Sheet for Patients:  PinkCheek.be Fact Sheet for Healthcare Providers:  GravelBags.it This test is not yet ap proved or cleared by the [place] FDA and  has been authorized for detection and/or diagnosis of SARS-CoV-2 by FDA under an Emergency Use Authorization (EUA). This EUA will remain  in effect (meaning this test can be used) for the duration of the COVID-19 declaration under Section 564(b)(1) of the Act, 21 U.S.C. section 360bbb-3(b)(1), unless the authorization is terminated or revoked sooner.    Influenza A by PCR NEGATIVE NEGATIVE Final   Influenza B by PCR NEGATIVE NEGATIVE Final    Comment: (NOTE) The Xpert Xpress SARS-CoV-2/FLU/RSV assay is intended as an aid in  the diagnosis of influenza from Nasopharyngeal swab specimens and  should not be used as a sole basis for treatment. Nasal washings and  aspirates are unacceptable for Xpert Xpress SARS-CoV-2/FLU/RSV  testing. Fact Sheet for Patients: PinkCheek.be Fact Sheet for Healthcare Providers: GravelBags.it This test is not yet approved or cleared by the [place] FDA and  has been authorized for detection and/or diagnosis of SARS-CoV-2 by  FDA under an Emergency Use Authorization (EUA). This EUA will remain  in effect (meaning this test can be used) for the duration of the  Covid-19 declaration under Section 564(b)(1) of the Act, 21  U.S.C. section 360bbb-3(b)(1), unless the authorization is  terminated or revoked. Performed at [facility], [place] [address], [place], [place] [address]      Discharge Instructions:   Discharge Instructions    Diet general   Complete by: As directed    Increase activity slowly   Complete by: As directed      Allergies as of 06/24/2019       Reactions   Adhesive [tape] Rash   Codeine Rash      Medication List    STOP taking these medications   Azelastine HCl 137 MCG/SPRAY Soln   estradiol 0.5 MG tablet Commonly known as: ESTRACE     TAKE these medications   acetaminophen 500 MG tablet Commonly known as: TYLENOL Take 1,000 mg by mouth every 6 (six) hours as needed for moderate pain.   albuterol 108 (90 Base) MCG/ACT inhaler Commonly known as: VENTOLIN HFA Inhale 2 puffs into the lungs every 6 (six) hours as needed for wheezing or shortness of breath.   amoxicillin 500 MG tablet Commonly known as: AMOXIL Take 2,000 mg by mouth as directed. For dental appointments   aspirin EC 81 MG tablet Take 81 mg by mouth daily.   atorvastatin 40 MG tablet Commonly known as: LIPITOR Take 1 tablet (40 mg total) by mouth daily.   Calcium 600+D 600-200 MG-UNIT Tabs Generic drug: Calcium Carbonate-Vitamin D Take 1 tablet by mouth 2 (two) times daily.   celecoxib 200 MG capsule Commonly known as: CELEBREX Take 1 capsule (200 mg total) by mouth 2 (two) times daily.   cetirizine 10 MG tablet Commonly known as: ZYRTEC Take 10 mg by mouth daily.   diclofenac Sodium 1 % Gel Commonly known as: VOLTAREN Apply 2 g topically 4 (four) times daily as needed (joint pain).   esomeprazole 40 MG capsule Commonly known as: NEXIUM TAKE 1 CAPSULE BY MOUTH DAILY BEFORE SUPPER.   fentaNYL 25 MCG/HR Commonly known as: [place] 1 patch onto the skin every 3 (three) days.   fluticasone  220 MCG/ACT inhaler Commonly known as: Flovent HFA INHALE 2 PUFFS INTO THE LUNGS 2 (TWO) TIMES DAILY. What changed:   how much to take  how to take this  when to take this  additional instructions   fluticasone 50 MCG/ACT nasal spray Commonly known as: FLONASE Place 2 sprays into both nostrils in the morning and at bedtime.   GUAIFENESIN ER PO Take 400 mg by mouth 2 (two) times daily.   HYDROcodone-acetaminophen 7.5-325 MG  tablet Commonly known as: NORCO Take 1 tablet by mouth every 12 (twelve) hours as needed for moderate pain.   loperamide 2 MG tablet Commonly known as: IMODIUM A-D Take 2 mg by mouth 4 (four) times daily as needed for diarrhea or loose stools.   loratadine 10 MG tablet Commonly known as: CLARITIN Take 10 mg by mouth daily.   methocarbamol 500 MG tablet Commonly known as: ROBAXIN Take 500 mg by mouth in the morning and at bedtime.   montelukast 10 MG tablet Commonly known as: SINGULAIR Take 1 tablet (10 mg total) by mouth at bedtime.   multivitamin with minerals tablet Take 1 tablet by mouth daily.   Neurontin 800 MG tablet Generic drug: gabapentin Take 800 mg by mouth at bedtime.   niacinamide 500 MG tablet Take 500 mg by mouth 2 (two) times daily with a meal.   omega-3 acid ethyl esters 1 g capsule Commonly known as: LOVAZA TAKE 2 CAPSULES BY MOUTH 2 TIMES DAILY. What changed: See the new instructions.   PARoxetine 30 MG tablet Commonly known as: PAXIL Take 60 mg by mouth daily.   [place] 4 % Crea Generic drug: Fluorouracil Apply 1 application topically every 3 (three) days.      Follow-up Information    Rita Ohara, MD Follow up in 1 week(s).   Specialty: Family Medicine Contact information: [place] [address] [phone]        Josue [name], MD .   Specialty: Cardiology Contact information: [phone] [address] [place] [address] [phone]            Time coordinating discharge: 25 min  Signed:  Geradine Girt DO  [facility] 06/24/2019, 12:57 PM

## 2019-06-25 MED FILL — METHOCARBAMOL 500 MG TABS: 500 | 60 days supply | Qty: 180 | Fill #2

## 2019-06-26 ENCOUNTER — Other Ambulatory Visit: Payer: Self-pay | Admitting: Family Medicine

## 2019-06-26 ENCOUNTER — Telehealth: Payer: Self-pay

## 2019-06-26 NOTE — Telephone Encounter (Signed)
I called the pt. To get her scheduled for a hospital f/u since she was recently in the hospital for chest pain. I got her scheduled on 06/28/19 and I went over her medication changes and they were reconciled.

## 2019-06-27 DIAGNOSIS — M25511 Pain in right shoulder: Secondary | ICD-10-CM | POA: Diagnosis not present

## 2019-06-27 DIAGNOSIS — M25611 Stiffness of right shoulder, not elsewhere classified: Secondary | ICD-10-CM | POA: Diagnosis not present

## 2019-06-27 MED ORDER — OMEGA-3-ACID ETHYL ESTERS 1 G PO CAPS: ORAL_CAPSULE | ORAL | 0 refills | Status: DC

## 2019-06-27 NOTE — Progress Notes (Signed)
Chief Complaint  Patient presents with  . Hospitalization Follow-up    patient states she is feeling better.    Patient presents for hospital follow-up. She was hospitalized 5/6-5/8 with chest pain. She had developed acute onset of substernal pressure/pain, radiating to R neck/jaw while at rest.  No associated diaphoresis, N/V, abdominal pain, dizziness or palpitations, possibly had some shortness of breath.  She was admitted for further evaluation/observation.   Troponins remained normal.  Cardiac workup was normal (see below).   No medications were changed.  (She had already stopped using Astelin and estrogen prior to hospitalization, these were the meds discontinued on discharge summary).  Been off Estradiol since January, occasional hot flash/sweats. She hasn't had any recurrent problems since discharge.  Echocardiogram on 06/23/19: IMPRESSIONS  1. Left ventricular ejection fraction, by estimation, is 55 to 60%. The left ventricle has normal function. The left ventricle has no regional wall motion abnormalities. There is mild left ventricular hypertrophy. Left ventricular diastolic function could not be evaluated.  2. Right ventricular systolic function is normal. The right ventricular size is normal.  3. The mitral valve is grossly normal. Trivial mitral valve regurgitation. No evidence of mitral stenosis.  4. The aortic valve has an indeterminant number of cusps. Aortic valve regurgitation is not visualized. Mild aortic valve sclerosis is present, with no evidence of aortic valve stenosis.  Stress test on 06/24/19: IMPRESSION: 1. Artifact due to subdiaphragmatic splanchnic activity along the inferior left ventricular wall. No definite reversible ischemia or infarction. 2. Inferior wall hypokinesis is likely artifactual, related to subdiaphragmatic splanchnic activity. 3. Left ventricular ejection fraction 74% 4. Non invasive risk stratification*: Low  CXR 5/6 showed no active disease.  She had  CT of the chest (ordered by pulmonary) on 06/20/19: IMPRESSION: 1. There is a spectrum of findings compatible with interstitial lung disease, categorized as probable usual interstitial pneumonia (UIP) per current ATS guidelines. However, there is lack of overt honeycombing, and moderate air trapping which is unusual for usual interstitial pneumonia. The possibility of nonspecific interstitial pneumonia (NSIP) should be considered. 2. Severe tracheobronchomalacia. 3. Aortic atherosclerosis, in addition to left main and left anterior descending coronary artery disease. Assessment for potential risk factor modification, dietary therapy or pharmacologic therapy may be warranted, if clinically indicated.  Lab review: (lipids were nonfasting) Lab Results  Component Value Date   CHOL 161 06/22/2019   HDL 49 06/22/2019   LDLCALC 73 06/22/2019   TRIG 197 (H) 06/22/2019   CHOLHDL 3.3 06/22/2019   Lab Results  Component Value Date   WBC 10.0 06/22/2019   HGB 14.1 06/22/2019   HCT 44.8 06/22/2019   MCV 102.1 (H) 06/22/2019   PLT 208 06/22/2019     Chemistry      Component Value Date/Time   NA 142 06/22/2019 0917   NA 144 01/11/2019 0815   K 3.9 06/22/2019 0917   CL 106 06/22/2019 0917   CO2 28 06/22/2019 0917   BUN 15 06/22/2019 0917   BUN 15 01/11/2019 0815   CREATININE 0.69 06/22/2019 0917   CREATININE 0.64 11/16/2016 1142      Component Value Date/Time   CALCIUM 9.7 06/22/2019 0917   ALKPHOS 91 01/11/2019 0815   AST 21 01/11/2019 0815   ALT 19 01/11/2019 0815   BILITOT 0.3 01/11/2019 0815     She had recently sent a message (and originally scheduled a visit for today to evaluate) regarding increased bruising, only noted on her forearms. She was advised at the time  that she hadn't had CBC since 03/2019, and that she was on medications that increase risk for bleeding/bruising (celebrex, aspirin, paxil), but none that were new. Lovaza also has anti-platelet effects (not mentioned in my  email response at that time), but she has been on that since 2016 also.   She had CBC done during recent hospitalization, showing normal platelets. The only different medication was chlorpheniramine.  No known trauma, except maybe some from the watch on her LUE; she has been getting PT, and is leaning some on her arms.  There are some that are like regular bruises.  But she has noted more punctate red areas/bruises that have been persistent since 5/1 (per date of photos, probably earlier), not improving/changing much.  These are more on the L forearm, which she may attribute to the bracelet/plastic coil band she wears on her wrist at therapy. She denies any other bleeding.  She hasn't had nosebleeds since stopping Astelin.  She has been getting PT for R shoulder, since she underwent surgery 03/2019. "I'm doing really well".   PMH, PSH, SH reviewed  Outpatient Encounter Medications as of 06/28/2019  Medication Sig Note  . aspirin EC 81 MG tablet Take 81 mg by mouth daily.   Marland Kitchen atorvastatin (LIPITOR) 40 MG tablet Take 1 tablet (40 mg total) by mouth daily.   . Calcium Carbonate-Vitamin D (CALCIUM 600+D) 600-200 MG-UNIT TABS Take 1 tablet by mouth 2 (two) times daily.     . celecoxib (CELEBREX) 200 MG capsule Take 1 capsule (200 mg total) by mouth 2 (two) times daily.   . diclofenac Sodium (VOLTAREN) 1 % GEL Apply 2 g topically 4 (four) times daily as needed (joint pain).    Marland Kitchen esomeprazole (NEXIUM) 40 MG capsule TAKE 1 CAPSULE BY MOUTH DAILY BEFORE SUPPER.   . fentaNYL (DURAGESIC - DOSED MCG/HR) 25 MCG/HR Place 1 patch onto the skin every 3 (three) days.   06/22/2019: LF @ [facility] on 05-26-19 # 10 DS 30  . Fluorouracil (TOLAK) 4 % CREA Apply 1 application topically every 3 (three) days.  06/22/2019: Uses in the winter  . fluticasone (FLONASE) 50 MCG/ACT nasal spray Place 2 sprays into both nostrils in the morning and at bedtime.   . fluticasone (FLOVENT HFA) 220 MCG/ACT inhaler INHALE 2 PUFFS INTO THE LUNGS 2  (TWO) TIMES DAILY. (Patient taking differently: Inhale 2 puffs into the lungs in the morning and at bedtime. )   . gabapentin (NEURONTIN) 800 MG tablet Take 800 mg by mouth at bedtime.    . GUAIFENESIN ER PO Take 400 mg by mouth 2 (two) times daily.    Marland Kitchen HYDROcodone-acetaminophen (NORCO) 7.5-325 MG tablet Take 1 tablet by mouth every 12 (twelve) hours as needed for moderate pain.  06/22/2019: LF @ [facility]  on 03-22-19 #100 Ds 25  . methocarbamol (ROBAXIN) 500 MG tablet Take 500 mg by mouth in the morning and at bedtime.    . montelukast (SINGULAIR) 10 MG tablet Take 1 tablet (10 mg total) by mouth at bedtime.   . Multiple Vitamins-Minerals (MULTIVITAMIN WITH MINERALS) tablet Take 1 tablet by mouth daily.   . niacinamide 500 MG tablet Take 500 mg by mouth 2 (two) times daily with a meal.   . omega-3 acid ethyl esters (LOVAZA) 1 g capsule TAKE 2 CAPSULES BY MOUTH 2 TIMES DAILY.   Marland Kitchen PARoxetine (PAXIL) 30 MG tablet Take 60 mg by mouth daily.    Marland Kitchen acetaminophen (TYLENOL) 500 MG tablet Take 1,000 mg  by mouth every 6 (six) hours as needed for moderate pain.    Marland Kitchen albuterol (PROVENTIL HFA;VENTOLIN HFA) 108 (90 Base) MCG/ACT inhaler Inhale 2 puffs into the lungs every 6 (six) hours as needed for wheezing or shortness of breath. (Patient not taking: Reported on 06/28/2019)   . amoxicillin (AMOXIL) 500 MG tablet Take 2,000 mg by mouth as directed. For dental appointments   . cetirizine (ZYRTEC) 10 MG tablet Take 10 mg by mouth daily.   Marland Kitchen loperamide (IMODIUM A-D) 2 MG tablet Take 2 mg by mouth 4 (four) times daily as needed for diarrhea or loose stools.   Marland Kitchen loratadine (CLARITIN) 10 MG tablet Take 10 mg by mouth daily.    No facility-administered encounter medications on file as of 06/28/2019.   Allergies  Allergen Reactions  . Adhesive [Tape] Rash  . Codeine Rash    ROS: no fever, chills, URI symptoms.  +chronic cough, only intermittently gets up discolored mucus. Allergies are well controlled  currently, still has some chronic PND, with scratchy voice. No nausea, vomiting, bowel changes, urinary complaints. +bruising per HPI. Shoulder improving. No further chest pain   PHYSICAL EXAM:  BP 122/68   Pulse 60   Temp 97.6 F (36.4 C) (Tympanic)   Ht _0  (1.651 m)   Wt 163 lb 6.4 oz (74.1 kg)   BMI 27.19 kg/m   Well-appearing, pleasant female, in no distress She is comfortable and in good spirits HEENT: conjunctiva and sclera are clear, EOMI, wearing mask Neck: no lymphadenopathy, thyromegaly or carotid bruit Heart: regular rate and rhythm, no murmur Lungs: clear bilaterally, no wheezes, rales, ronchi Abdomen: soft, nontender, no mass Extremities: no edema Psych: normal mood, affect, hygiene and grooming Neuro: alert and oriented, normal gait. Skin: There are some ecchymoses noted, but her concern had been somewhat red-purplish macular round area, in some clusters on forearms, L>R.  None elsewhere   ASSESSMENT/PLAN:  Atypical chest pain - resolved. Ddx reviewed.  Has f/u with cardiology and pulm scheduled  Mixed hyperlipidemia - on statin and Lovaza. LDL 73.  TG were elevated on recent check, not fasting  Aortic atherosclerosis ([place]) - noted on chest CT. On statin and ASA  Atherosclerosis of native coronary artery of native heart, angina presence unspecified - noted on chest CT  Gastroesophageal reflux disease without esophagitis - on Nexium 53m daily, denies symptoms  [place]stitial lung disease ([place] - under care of pulm, has f/u scheduled. No significant change to symptoms  Tracheobronchomalacia - noted on CT ordered by pulm.  Has f/u scheduled  Bruising - reviewed meds that put her at increased risk. These were somewhat purpuric, not palpable. Had negative vasculitis work-up 07/2018  Had negative ANCA, ANA, CRP done 07/2018 with last episode of amaurosis fugax ?conn tissue dz/await pulm eval/review of CT  Has appt with Dr. NJohnsie Cancelon 5/20  Scheduled for  CPE/AWV on 6/1 Labs scheduled prior include c-met and lipids--these were cancelled given recent labs in ER  Discussed that it appears she is transferring care, with an establish care visit in late June with new provider, closer to her home. We discussed whether she wants to keep her AWV here, vs do that with new provider. I left that up to her, happy to do here, or she can cancel.

## 2019-06-28 ENCOUNTER — Encounter: Payer: Self-pay | Admitting: Family Medicine

## 2019-06-28 ENCOUNTER — Other Ambulatory Visit: Payer: Self-pay

## 2019-06-28 ENCOUNTER — Ambulatory Visit: Payer: Medicare PPO | Admitting: Family Medicine

## 2019-06-28 VITALS — BP 122/68 | HR 60 | Temp 97.6°F | Ht 65.0 in | Wt 163.4 lb

## 2019-06-28 DIAGNOSIS — I2510 Atherosclerotic heart disease of native coronary artery without angina pectoris: Secondary | ICD-10-CM | POA: Diagnosis not present

## 2019-06-28 DIAGNOSIS — E782 Mixed hyperlipidemia: Secondary | ICD-10-CM | POA: Diagnosis not present

## 2019-06-28 DIAGNOSIS — R0789 Other chest pain: Secondary | ICD-10-CM | POA: Diagnosis not present

## 2019-06-28 DIAGNOSIS — T148XXA Other injury of unspecified body region, initial encounter: Secondary | ICD-10-CM

## 2019-06-28 DIAGNOSIS — I700 Atherosclerosis of aorta: Secondary | ICD-10-CM

## 2019-06-28 DIAGNOSIS — K219 Gastro-esophageal reflux disease without esophagitis: Secondary | ICD-10-CM | POA: Diagnosis not present

## 2019-06-28 DIAGNOSIS — J849 Interstitial pulmonary disease, unspecified: Secondary | ICD-10-CM | POA: Diagnosis not present

## 2019-06-28 DIAGNOSIS — J398 Other specified diseases of upper respiratory tract: Secondary | ICD-10-CM

## 2019-06-29 ENCOUNTER — Encounter: Payer: Self-pay | Admitting: Family Medicine

## 2019-06-29 NOTE — Progress Notes (Signed)
I just checked and you can not bill for any other services on the same day as TOC

## 2019-06-29 NOTE — Progress Notes (Signed)
CARDIOLOGY CONSULT NOTE       Patient ID: Beverly [name] MRN: [id number] DOB/AGE: 26-Jul-1945 74 y.o.  Primary Cardiologist: Oval Linsey most recent    [facility] f/u  HPI:   74 y.o. I have note seen in 3 years. She has history of asthma/bronchiectasis  and OSA using oral device. 2016 ? Amaurosis fugax with negative w/u echo carotids normal, monitor with rate SVT no PAF and nocturnal bradycardia. Myovue done 2018 atypical chest pain with GI overtones normal no ischemia EF 69% History of GERD and HLD on statin   Seen by Dr Oval Linsey 06/23/19 atypical chest pain. R/o no acute ECG changes echo normal EF 55-60% AV sclerosis 06/24/19 Myovue 06/24/19 normal no ischemia diaphragmatic attenuation EF 74%   No further pains feels great     ROS All other systems reviewed and negative except as noted above  Past Medical History:  Diagnosis Date  . Allergy   . Amaurosis fugax    negative w/u through [facility]  . Asthma   . Back pain    Dr Joline Salt 02/2010-epidural injection x 2 at L4-5 with good effect  . 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 BCC (basal cell carcinoma of skin) 05/09/2014   mid lower back  . BCC (basal cell carcinoma of skin) 05/03/2017   right low back  . BCC (basal cell carcinoma of skin) 05/03/2017   left upper back  . BCC (basal cell carcinoma of skin) 07/05/2018   left mid back  . BCC (basal cell carcinoma of skin) 05/20/1992   upper back  . BCC (basal cell carcinoma of skin) 07/29/1993   left sholder medial  . BCC (basal cell carcinoma of skin) 07/29/1993   left sholder lateral  . BCC (basal cell carcinoma of skin) 07/29/1993   right thigh  . BCC (basal cell carcinoma of skin) 07/29/1993   right sholder  . BCC (basal cell carcinoma of skin) 12/22/1994   right mid forearm  . BCC (basal cell carcinoma of skin) 12/22/1994   right upper forearm  . BCC (basal cell carcinoma of skin) 12/22/1994   lower right upper arm  . BCC (basal cell carcinoma  of skin) 12/22/1994   right upper arm sholder  . BCC (basal cell carcinoma of skin) 08/11/1995   left leg below knee  . BCC (basal cell carcinoma of skin) 04/11/2002   mid back  . BCC (basal cell carcinoma of skin) 12/10/2002   right center upper back  . BCC (basal cell carcinoma of skin) 05/26/2005   right post sholder  . BCC (basal cell carcinoma) 05/09/2014   left inner shin  . BCC (basal cell carcinoma) 06/12/2014   left forearm  . Bowen's disease 10/07/1994   right post knee, right inner forearm/wrist  . Bowen's disease 08/11/1995   left sholder  . Cataract    left  . Chronic pain   . Depression   . Diverticulosis of colon 1998   mild  . DJD (degenerative joint disease)   . Duodenal ulcer    h/o  . GERD (gastroesophageal reflux disease)   . History of SCC (squamous cell carcinoma) of skin    Dr. Denna Haggard  . History of sinus bradycardia   . HLD (hyperlipidemia)    hypertriglyceridemia  . Hypertensive retinopathy of both eyes 11/18/2017  . IBS (irritable bowel syndrome)   . Internal hemorrhoids 1998  . Mitral regurgitation    mild  . Osteoarthritis    feet,shoulder,neck,back,hips and hands.  Marland Kitchen  Pneumonia   . PONV (postoperative nausea and vomiting)   . Rotator cuff tear, right 02/2019   infraspinatus and supraspinatus, and dislocation of long head of bicep tendon (Dr. Alvan Dame)  . SCC (squamous cell carcinoma) 11/26/2014   left hand, right hand, right deltoid  . SCC (squamous cell carcinoma) 05/03/2017   left cheek  . Sleep apnea    uses a mouth guard nightly  . Squamous cell carcinoma in situ (SCCIS) 07/05/2018   left hand  . Vitamin D deficiency    mild    Family History  Problem Relation Age of Onset  . Depression Mother   . Schizophrenia Mother   . Cancer Father        oral  . Heart disease Father        bradycardia  . Cancer Sister        skin and lung  . COPD Sister   . Hypertension Sister   . Osteoporosis Sister        compression fx's x 3  11/2018  . Hyperthyroidism Brother   . Cancer Brother        metastatic cancer to bone (?primary)  . Hyperlipidemia Daughter   . Asthma Son   . Cancer Paternal Grandmother 27       colon cancer, metastatic to liver  . Colon cancer Paternal Grandmother   . Diabetes Maternal Grandmother   . ADD / ADHD Other     Social History   Socioeconomic History  . Marital status: Divorced    Spouse name: Not on file  . Number of children: 2  . Years of education: Not on file  . Highest education level: Not on file  Occupational History  . Occupation: STAFF Optician, dispensing: [employer]  Tobacco Use  . Smoking status: Never Smoker  . Smokeless tobacco: Never Used  Substance and Sexual Activity  . Alcohol use: Yes    Alcohol/week: 7.0 standard drinks    Types: 7 Glasses of wine per week    Comment: 1 glass of wine daily  . Drug use: No  . Sexual activity: Not Currently  Other Topics Concern  . 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 , [place].   [place] for [employer] part-time   Social Determinants of Health   Financial Resource Strain:   . Difficulty of Paying Living Expenses:   Food Insecurity:   . Worried About Charity fundraiser in the Last Year:   . Arboriculturist in the Last Year:   Transportation Needs:   . Film/video editor (Medical):   Marland Kitchen Lack of Transportation (Non-Medical):   Physical Activity:   . Days of Exercise per Week:   . Minutes of Exercise per Session:   Stress:   . Feeling of Stress :   Social Connections:   . Frequency of Communication with Friends and Family:   . Frequency of Social Gatherings with Friends and Family:   . Attends Religious Services:   . Active Member of Clubs or Organizations:   . Attends Archivist Meetings:   Marland Kitchen  Marital Status:   Intimate Partner Violence:   . Fear of Current or Ex-Partner:   . Emotionally Abused:   Marland Kitchen Physically Abused:   . Sexually Abused:  Past Surgical History:  Procedure Laterality Date  . ABDOMINAL HYSTERECTOMY    . BLEPHAROPLASTY Bilateral 01/2018  . BROW LIFT Bilateral 10/10/2018   Procedure: BROW LIFT;  Surgeon: Wallace Going, DO;  Location: [facility];  Service: Plastics;  Laterality: Bilateral;  . BUNIONECTOMY     R 12/08, L 2004 (Dr. Janus Molder)  . CARPAL TUNNEL RELEASE  1989   bilateral  . CATARACT EXTRACTION Bilateral Left in 08/2012, Right 04/2016   Dr.Hecker  . CESAREAN SECTION     x2  . CHOLECYSTECTOMY  1992  . CLOSED REDUCTION NASAL FRACTURE N/A 08/29/2018   Procedure: CLOSED REDUCTION NASAL FRACTURE;  Surgeon: Wallace Going, DO;  Location: [facility];  Service: Plastics;  Laterality: N/A;  1 hour, please  . COLONOSCOPY  2006  . COLONOSCOPY  01/2012   due again 01/2022; mild diverticulosis  . epidural steroid injection, back  02/2010  . HIP SURGERY     right bursectomy x 3  . HIP SURGERY Right 2000   torn cartilage, repaired  . INGUINAL HERNIA REPAIR  9/09   bilat  . NECK SURGERY  1989   c6-7 cervical laminectomy and diskecotmy  . NECK SURGERY  1989   per patient posterior area of neck  . REVERSE SHOULDER ARTHROPLASTY Right 04/11/2019   Procedure: REVERSE SHOULDER ARTHROPLASTY;  Surgeon: Nicholes Stairs, MD;  Location: [facility];  Service: Orthopedics;  Laterality: Right;  Regional Block  . SHOULDER SURGERY  2/05   left rotator cuff repair  . SHOULDER SURGERY Left 03/14/2018   rotator cuff repair; Dr. Tonita Cong  . TONSILLECTOMY  age 76  . TOTAL ABDOMINAL HYSTERECTOMY W/ BILATERAL SALPINGOOPHORECTOMY  1997   fibroids  . TROCHANTERIC BURSA EXCISION Right 1978  . TUBAL LIGATION  1986  . WISDOM TOOTH EXTRACTION        Current Outpatient Medications:  .  acetaminophen (TYLENOL) 500 MG tablet, Take 1,000  mg by mouth every 6 (six) hours as needed for moderate pain. , Disp: , Rfl:  .  albuterol (PROVENTIL HFA;VENTOLIN HFA) 108 (90 Base) MCG/ACT inhaler, Inhale 2 puffs into the lungs every 6 (six) hours as needed for wheezing or shortness of breath. (Patient not taking: Reported on 06/28/2019), Disp: 18 g, Rfl: 1 .  amoxicillin (AMOXIL) 500 MG tablet, Take 2,000 mg by mouth as directed. For dental appointments, Disp: , Rfl:  .  aspirin EC 81 MG tablet, Take 81 mg by mouth daily., Disp: , Rfl:  .  atorvastatin (LIPITOR) 40 MG tablet, Take 1 tablet (40 mg total) by mouth daily., Disp: 90 tablet, Rfl: 1 .  Calcium Carbonate-Vitamin D (CALCIUM 600+D) 600-200 MG-UNIT TABS, Take 1 tablet by mouth 2 (two) times daily.  , Disp: , Rfl:  .  celecoxib (CELEBREX) 200 MG capsule, Take 1 capsule (200 mg total) by mouth 2 (two) times daily., Disp: 180 capsule, Rfl: 1 .  cetirizine (ZYRTEC) 10 MG tablet, Take 10 mg by mouth daily., Disp: , Rfl:  .  diclofenac Sodium (VOLTAREN) 1 % GEL, Apply 2 g topically 4 (four) times daily as needed (joint pain). , Disp: , Rfl:  .  esomeprazole (NEXIUM) 40 MG capsule, TAKE 1 CAPSULE BY MOUTH DAILY BEFORE SUPPER., Disp: 90 capsule, Rfl: 0 .  fentaNYL (DURAGESIC - DOSED MCG/HR) 25 MCG/HR, Place 1 patch onto the skin every 3 (three) days.  , Disp: , Rfl:  .  Fluorouracil (TOLAK) 4 % CREA, Apply 1 application topically every 3 (three)  days. , Disp: , Rfl:  .  fluticasone (FLONASE) 50 MCG/ACT nasal spray, Place 2 sprays into both nostrils in the morning and at bedtime., Disp: , Rfl:  .  fluticasone (FLOVENT HFA) 220 MCG/ACT inhaler, INHALE 2 PUFFS INTO THE LUNGS 2 (TWO) TIMES DAILY. (Patient taking differently: Inhale 2 puffs into the lungs in the morning and at bedtime. ), Disp: 36 g, Rfl: 3 .  gabapentin (NEURONTIN) 800 MG tablet, Take 800 mg by mouth at bedtime. , Disp: , Rfl:  .  GUAIFENESIN ER PO, Take 400 mg by mouth 2 (two) times daily. , Disp: , Rfl:  .  HYDROcodone-acetaminophen  (NORCO) 7.5-325 MG tablet, Take 1 tablet by mouth every 12 (twelve) hours as needed for moderate pain. , Disp: , Rfl:  .  loperamide (IMODIUM A-D) 2 MG tablet, Take 2 mg by mouth 4 (four) times daily as needed for diarrhea or loose stools., Disp: , Rfl:  .  loratadine (CLARITIN) 10 MG tablet, Take 10 mg by mouth daily., Disp: , Rfl:  .  methocarbamol (ROBAXIN) 500 MG tablet, Take 500 mg by mouth in the morning and at bedtime. , Disp: , Rfl:  .  montelukast (SINGULAIR) 10 MG tablet, Take 1 tablet (10 mg total) by mouth at bedtime., Disp: 90 tablet, Rfl: 3 .  Multiple Vitamins-Minerals (MULTIVITAMIN WITH MINERALS) tablet, Take 1 tablet by mouth daily., Disp: , Rfl:  .  niacinamide 500 MG tablet, Take 500 mg by mouth 2 (two) times daily with a meal., Disp: , Rfl:  .  omega-3 acid ethyl esters (LOVAZA) 1 g capsule, TAKE 2 CAPSULES BY MOUTH 2 TIMES DAILY., Disp: 360 capsule, Rfl: 0 .  PARoxetine (PAXIL) 30 MG tablet, Take 60 mg by mouth daily. , Disp: , Rfl:     Physical Exam: There were no vitals taken for this visit.   Telephone no exam   Labs:   Lab Results  Component Value Date   WBC 10.0 06/22/2019   HGB 14.1 06/22/2019   HCT 44.8 06/22/2019   MCV 102.1 (H) 06/22/2019   PLT 208 06/22/2019   No results for input(s): NA, K, CL, CO2, BUN, CREATININE, CALCIUM, PROT, BILITOT, ALKPHOS, ALT, AST, GLUCOSE in the last 168 hours.  Invalid input(s): LABALBU No results found for: CKTOTAL, CKMB, CKMBINDEX, TROPONINI  Lab Results  Component Value Date   CHOL 161 06/22/2019   CHOL 162 01/11/2019   CHOL 145 07/12/2018   Lab Results  Component Value Date   HDL 49 06/22/2019   HDL 47 01/11/2019   HDL 36 (L) 07/12/2018   Lab Results  Component Value Date   LDLCALC 73 06/22/2019   LDLCALC 85 01/11/2019   LDLCALC 61 07/12/2018   Lab Results  Component Value Date   TRIG 197 (H) 06/22/2019   TRIG 173 (H) 01/11/2019   TRIG 238 (H) 07/12/2018   Lab Results  Component Value Date    CHOLHDL 3.3 06/22/2019   CHOLHDL 3.4 01/11/2019   CHOLHDL 4.0 07/12/2018   No results found for: LDLDIRECT    Radiology: DG Chest 2 View  Result Date: 06/22/2019 CLINICAL DATA:  Right chest pain EXAM: CHEST - 2 VIEW COMPARISON:  09/01/2017 FINDINGS: The lungs are clear without focal pneumonia, edema, pneumothorax or pleural effusion. The cardiopericardial silhouette is within normal limits for size. The visualized bony structures of the thorax are intact. Surgical changes noted in both shoulders. IMPRESSION: No active cardiopulmonary disease. Electronically Signed   By: Verda Cumins.D.  On: 06/22/2019 09:35   CT Chest High Resolution  Result Date: 06/20/2019 CLINICAL DATA:  74 year old female with history of interstitial lung disease. Evaluate for bronchiectasis. EXAM: CT CHEST WITHOUT CONTRAST TECHNIQUE: Multidetector CT imaging of the chest was performed following the standard protocol without intravenous contrast. High resolution imaging of the lungs, as well as inspiratory and expiratory imaging, was performed. COMPARISON:  No priors. FINDINGS: Cardiovascular: Heart size is normal. There is no significant pericardial fluid, thickening or pericardial calcification. There is aortic atherosclerosis, as well as atherosclerosis of the great vessels of the mediastinum and the coronary arteries, including calcified atherosclerotic plaque in the left main and left anterior descending coronary arteries. Mediastinum/Nodes: No pathologically enlarged mediastinal or hilar lymph nodes. Please note that accurate exclusion of hilar adenopathy is limited on noncontrast CT scans. Esophagus is unremarkable in appearance. No axillary lymphadenopathy. Lungs/Pleura: High-resolution images demonstrates some patchy areas of ground-glass attenuation, peripheral predominant septal thickening and mild cylindrical bronchiectasis noted throughout the lungs bilaterally, most evident in the mid to lower lungs. No honeycombing.  Inspiratory and expiratory imaging demonstrates moderate air trapping indicative of small airways disease. There is also extensive collapse of the trachea and mainstem bronchi. Upper Abdomen: Aortic atherosclerosis. Musculoskeletal: There are no aggressive appearing lytic or blastic lesions noted in the visualized portions of the skeleton. IMPRESSION: 1. There is a spectrum of findings compatible with interstitial lung disease, categorized as probable usual interstitial pneumonia (UIP) per current ATS guidelines. However, there is lack of overt honeycombing, and moderate air trapping which is unusual for usual interstitial pneumonia. The possibility of nonspecific interstitial pneumonia (NSIP) should be considered. 2. Severe tracheobronchomalacia. 3. Aortic atherosclerosis, in addition to left main and left anterior descending coronary artery disease. Assessment for potential risk factor modification, dietary therapy or pharmacologic therapy may be warranted, if clinically indicated. Aortic Atherosclerosis (ICD10-I70.0). Electronically Signed   By: Vinnie Langton M.D.   On: 06/20/2019 16:23   NM Myocar Multi W/Spect Tamela Oddi Motion / EF  Result Date: 06/24/2019 CLINICAL DATA:  Chest pain. EXAM: MYOCARDIAL IMAGING WITH SPECT (REST AND PHARMACOLOGIC-STRESS) GATED LEFT VENTRICULAR WALL MOTION STUDY LEFT VENTRICULAR EJECTION FRACTION TECHNIQUE: Standard myocardial SPECT imaging was performed after resting intravenous injection of 10.2 mCi Tc-6m tetrofosmin. Subsequently, intravenous infusion of Lexiscan was performed under the supervision of the Cardiology staff. At peak effect of the drug, 31.0 mCi Tc-78m tetrofosmin was injected intravenously and standard myocardial SPECT imaging was performed. Quantitative gated imaging was also performed to evaluate left ventricular wall motion, and estimate left ventricular ejection fraction. COMPARISON:  None. FINDINGS: Perfusion: Artifact is seen on resting images due to  subdiaphragmatic splanchnic activity along the inferior wall of the left ventricle. No decreased activity in the left ventricle on stress imaging to suggest reversible ischemia or infarction. Wall Motion: The inferior left ventricular wall appears hypokinetic, however this is likely artifactual given the subdiaphragmatic activity along the inferior wall. No left ventricular dilation. Left Ventricular Ejection Fraction: 74 % End diastolic volume 63 ml End systolic volume 16 ml IMPRESSION: 1. Artifact due to subdiaphragmatic splanchnic activity along the inferior left ventricular wall. No definite reversible ischemia or infarction. 2. Inferior wall hypokinesis is likely artifactual, related to subdiaphragmatic splanchnic activity. 3. Left ventricular ejection fraction 74% 4. Non invasive risk stratification*: Low *2012 Appropriate Use Criteria for Coronary Revascularization Focused Update: J Am Coll Cardiol. [id number]. http://content.airportbarriers.com.aspx?articleid=[email or web address] Electronically Signed   By: Marlaine Hind M.D.   On: 06/24/2019 12:26   ECHOCARDIOGRAM COMPLETE  Result Date: 06/23/2019  ECHOCARDIOGRAM REPORT   Patient Name:   NASHALY LEIB Date of Exam: 06/23/2019 Medical Rec #:  [id number]         Height:       65.0 in Accession #:    [id number]        Weight:       162.7 lb Date of Birth:  1945/06/11        BSA:          1.812 m Patient Age:    69 years          BP:           137/62 mmHg Patient Gender: F                 HR:           54 bpm. Exam Location:  Inpatient Procedure: 2D Echo Indications:    chest pain  History:        Patient has prior history of Echocardiogram examinations, most                 recent 01/17/2015. Risk Factors:Dyslipidemia.  Sonographer:    Jannett Celestine RDCS (AE) Referring Phys: [phone] [facility] A [facility]  Sonographer Comments: challenging apical windows IMPRESSIONS  1. Left ventricular ejection fraction, by estimation, is 55 to 60%. The left ventricle has normal  function. The left ventricle has no regional wall motion abnormalities. There is mild left ventricular hypertrophy. Left ventricular diastolic function could not be evaluated.  2. Right ventricular systolic function is normal. The right ventricular size is normal.  3. The mitral valve is grossly normal. Trivial mitral valve regurgitation. No evidence of mitral stenosis.  4. The aortic valve has an indeterminant number of cusps. Aortic valve regurgitation is not visualized. Mild aortic valve sclerosis is present, with no evidence of aortic valve stenosis. FINDINGS  Left Ventricle: Left ventricular ejection fraction, by estimation, is 55 to 60%. The left ventricle has normal function. The left ventricle has no regional wall motion abnormalities. The left ventricular internal cavity size was normal in size. There is  mild left ventricular hypertrophy. Left ventricular diastolic function could not be evaluated. Right Ventricle: The right ventricular size is normal. No increase in right ventricular wall thickness. Right ventricular systolic function is normal. Left Atrium: Left atrial size was normal in size. Right Atrium: Right atrial size was normal in size. Pericardium: Trivial pericardial effusion is present. Mitral Valve: The mitral valve is grossly normal. Trivial mitral valve regurgitation. No evidence of mitral valve stenosis. Tricuspid Valve: The tricuspid valve is grossly normal. Tricuspid valve regurgitation is trivial. No evidence of tricuspid stenosis. Aortic Valve: The aortic valve has an indeterminant number of cusps. Aortic valve regurgitation is not visualized. Mild aortic valve sclerosis is present, with no evidence of aortic valve stenosis. There is mild calcification of the aortic valve. Pulmonic Valve: The pulmonic valve was not well visualized. Pulmonic valve regurgitation is not visualized. No evidence of pulmonic stenosis. Aorta: The aortic arch was not well visualized and the ascending aorta was  not well visualized. IAS/Shunts: The atrial septum is grossly normal.  LEFT VENTRICLE PLAX 2D LVIDd:         2.80 cm  Diastology LVIDs:         2.00 cm  LV e' lateral:   6.85 cm/s LV PW:         1.20 cm  LV E/e' lateral: 10.5 LV IVS:        1.50  cm LVOT diam:     1.80 cm LV SV:         60 LV SV Index:   33 LVOT Area:     2.54 cm  LEFT ATRIUM             Index LA diam:        2.50 cm 1.38 cm/m LA Vol (A2C):   50.0 ml 27.59 ml/m LA Vol (A4C):   26.5 ml 14.62 ml/m LA Biplane Vol: 36.2 ml 19.98 ml/m  AORTIC VALVE LVOT Vmax:   90.70 cm/s LVOT Vmean:  70.500 cm/s LVOT VTI:    0.235 m  AORTA Ao Root diam: 2.60 cm MITRAL VALVE MV Area (PHT): 2.87 cm    SHUNTS MV Decel Time: 264 msec    Systemic VTI:  0.24 m MV E velocity: 72.00 cm/s  Systemic Diam: 1.80 cm MV A velocity: 61.30 cm/s MV E/A ratio:  1.17 Buford Dresser MD Electronically signed by Buford Dresser MD Signature Date/Time: 06/23/2019/12:20:30 PM    Final     EKG: SR rate 62 low voltage poor R wave progression 06/23/19   ASSESSMENT AND PLAN:   1. Chest pain :  Atypical r/o normal myovue and echo observe ASA and statin   2. GERD:  Low carb diet prilosec   3. Pulmonary:  F/U Dr Shearon Stalls with UIP/ILD and severe tracheobronchomalacia Continue proventil, zyrtec, flonase flovent   4. HLD on statin LDL 73 on labs 06/22/19 at goal   Signed: [name] 06/29/2019, 3:41 PM

## 2019-07-05 ENCOUNTER — Telehealth: Payer: Self-pay

## 2019-07-05 DIAGNOSIS — Z4789 Encounter for other orthopedic aftercare: Secondary | ICD-10-CM | POA: Diagnosis not present

## 2019-07-05 MED FILL — CELECOXIB 200 MG CAP: 200 | 90 days supply | Qty: 180 | Fill #1

## 2019-07-05 NOTE — Telephone Encounter (Signed)
  Patient Consent for Virtual Visit         Beverly [name] has provided verbal consent on 07/05/2019 for a virtual visit (video or telephone).   CONSENT FOR VIRTUAL VISIT FOR:  Beverly [name]  By participating in this virtual visit I agree to the following:  I hereby voluntarily request, consent and authorize [facility] and its employed or contracted physicians, physician assistants, nurse practitioners or other licensed health care professionals (the Practitioner), to provide me with telemedicine health care services (the "Services") as deemed necessary by the treating Practitioner. I acknowledge and consent to receive the Services by the Practitioner via telemedicine. I understand that the telemedicine visit will involve communicating with the Practitioner through live audiovisual communication technology and the disclosure of certain medical information by electronic transmission. I acknowledge that I have been given the opportunity to request an in-person assessment or other available alternative prior to the telemedicine visit and am voluntarily participating in the telemedicine visit.  I understand that I have the right to withhold or withdraw my consent to the use of telemedicine in the course of my care at any time, without affecting my right to future care or treatment, and that the Practitioner or I may terminate the telemedicine visit at any time. I understand that I have the right to inspect all information obtained and/or recorded in the course of the telemedicine visit and may receive copies of available information for a reasonable fee.  I understand that some of the potential risks of receiving the Services via telemedicine include:  Marland Kitchen Delay or interruption in medical evaluation due to technological equipment failure or disruption; . Information transmitted may not be sufficient (e.g. poor resolution of images) to allow for appropriate medical decision making by the  Practitioner; and/or  . In rare instances, security protocols could fail, causing a breach of personal health information.  Furthermore, I acknowledge that it is my responsibility to provide information about my medical history, conditions and care that is complete and accurate to the best of my ability. I acknowledge that Practitioner's advice, recommendations, and/or decision may be based on factors not within their control, such as incomplete or inaccurate data provided by me or distortions of diagnostic images or specimens that may result from electronic transmissions. I understand that the practice of medicine is not an exact science and that Practitioner makes no warranties or guarantees regarding treatment outcomes. I acknowledge that a copy of this consent can be made available to me via my patient portal ([facility]), or I can request a printed copy by calling the office of [facility].    I understand that my insurance will be billed for this visit.   I have read or had this consent read to me. . I understand the contents of this consent, which adequately explains the benefits and risks of the Services being provided via telemedicine.  . I have been provided ample opportunity to ask questions regarding this consent and the Services and have had my questions answered to my satisfaction. . I give my informed consent for the services to be provided through the use of telemedicine in my medical care

## 2019-07-06 ENCOUNTER — Telehealth (INDEPENDENT_AMBULATORY_CARE_PROVIDER_SITE_OTHER): Payer: Medicare PPO | Admitting: Cardiovascular Disease

## 2019-07-06 VITALS — HR 53 | Ht 65.0 in | Wt 162.6 lb

## 2019-07-06 DIAGNOSIS — R079 Chest pain, unspecified: Secondary | ICD-10-CM

## 2019-07-06 NOTE — Patient Instructions (Addendum)

## 2019-07-10 ENCOUNTER — Telehealth: Payer: Self-pay | Admitting: Cardiovascular Disease

## 2019-07-10 NOTE — Telephone Encounter (Signed)
Patient is maked No Show but she did have her appt. Reached out to billing to make sure the patient is not billed.

## 2019-07-13 ENCOUNTER — Encounter: Payer: Self-pay | Admitting: Internal Medicine

## 2019-07-13 ENCOUNTER — Ambulatory Visit (INDEPENDENT_AMBULATORY_CARE_PROVIDER_SITE_OTHER): Payer: Medicare PPO | Admitting: Internal Medicine

## 2019-07-13 ENCOUNTER — Other Ambulatory Visit: Payer: Self-pay

## 2019-07-13 VITALS — BP 120/74 | HR 58 | Temp 98.4°F | Ht 65.0 in | Wt 163.0 lb

## 2019-07-13 DIAGNOSIS — J398 Other specified diseases of upper respiratory tract: Secondary | ICD-10-CM

## 2019-07-13 DIAGNOSIS — Z79891 Long term (current) use of opiate analgesic: Secondary | ICD-10-CM | POA: Diagnosis not present

## 2019-07-13 DIAGNOSIS — J479 Bronchiectasis, uncomplicated: Secondary | ICD-10-CM | POA: Diagnosis not present

## 2019-07-13 DIAGNOSIS — G4733 Obstructive sleep apnea (adult) (pediatric): Secondary | ICD-10-CM | POA: Diagnosis not present

## 2019-07-13 DIAGNOSIS — M47816 Spondylosis without myelopathy or radiculopathy, lumbar region: Secondary | ICD-10-CM | POA: Diagnosis not present

## 2019-07-13 DIAGNOSIS — M5416 Radiculopathy, lumbar region: Secondary | ICD-10-CM | POA: Diagnosis not present

## 2019-07-13 DIAGNOSIS — G894 Chronic pain syndrome: Secondary | ICD-10-CM | POA: Diagnosis not present

## 2019-07-13 MED ORDER — FLUTTER DEVI
1.0000 | Freq: Two times a day (BID) | 0 refills | Status: DC
Start: 2019-07-13 — End: 2021-02-04

## 2019-07-13 MED ORDER — SODIUM CHLORIDE 3 % IN NEBU: INHALATION_SOLUTION | Freq: Two times a day (BID) | RESPIRATORY_TRACT | 12 refills | Status: DC

## 2019-07-13 MED FILL — SODIUM CHLORIDE 3% VIAL: 3 | 50 days supply | Qty: 750 | Fill #0

## 2019-07-13 NOTE — Patient Instructions (Addendum)
The patient should have follow up scheduled with myself in 4 months.   Prior to next visit patient should have: Spirometry/Feno cpap titration  Stop taking flovent. Start taking nebulized saline twice a day followed by the flutter valve to help bring up mucus. Please re-submit your sputum culture to the lab for AFB.   Ok to take an anti-histamine for drainage. Can also take saline nasal spray as needed.   Flonase - 1 spray on each side of your nose twice a day for first week, then 1 spray on each side.   Instructions for use:  If you also use a saline nasal spray or rinse, use that first.  Position the head with the chin slightly tucked. Use the right hand to spray into the left nostril and the right hand to spray into the left nostril.   Point the bottle away from the septum of your nose (cartilage that divides the two sides of your nose).   Hold the nostril closed on the opposite side from where you will spray  Spray once and gently sniff to pull the medicine into the higher parts of your nose.  Don't sniff too hard as the medicine will drain down the back of your throat instead.  Repeat with a second spray on the same side if prescribed.  Repeat on the other side of your nose.

## 2019-07-13 NOTE — Progress Notes (Signed)
Beverly [name]    [id number]    January 17, 1946  [facility], Tera Helper, MD Date of Appointment: 07/13/2019 Established Patient Visit  Chief complaint:   Chief Complaint  Patient presents with  . Follow-up    about the same, cough with gray to yellow phlem     HPI: Beverly [name] is a 74 y.o. woman with bronchiectasis who presents for follow up after CT scan.   Interval Updates: Hospitalized for chest pain that was felt to be not anginal in nature. Since resolved.  Had her CT scan which confirms middle and lower lobe bronchiectasis.  Still having daily cough and mucus production. Having some fatigue since the last 6-8 months. Wakes up in the morning feeling un-refreshed. Has OSA and uses oral appliance but it needs to be repaired. Could fall asleep very easily in the middle of the day. Gained 5-10 lbs over the last 3 years.   I have reviewed the patient's family social and past medical history and updated as appropriate.   Past Medical History:  Diagnosis Date  . Allergy   . Amaurosis fugax    negative w/u through [facility]  . Asthma   . Back pain    Dr Joline Salt 02/2010-epidural injection x 2 at L4-5 with good effect  . 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 BCC (basal cell carcinoma of skin) 05/09/2014   mid lower back  . BCC (basal cell carcinoma of skin) 05/03/2017   right low back  . BCC (basal cell carcinoma of skin) 05/03/2017   left upper back  . BCC (basal cell carcinoma of skin) 07/05/2018   left mid back  . BCC (basal cell carcinoma of skin) 05/20/1992   upper back  . BCC (basal cell carcinoma of skin) 07/29/1993   left sholder medial  . BCC (basal cell carcinoma of skin) 07/29/1993   left sholder lateral  . BCC (basal cell carcinoma of skin) 07/29/1993   right thigh  . BCC (basal cell carcinoma of skin) 07/29/1993   right sholder  . BCC (basal cell carcinoma of skin) 12/22/1994   right mid forearm  . BCC (basal  cell carcinoma of skin) 12/22/1994   right upper forearm  . BCC (basal cell carcinoma of skin) 12/22/1994   lower right upper arm  . BCC (basal cell carcinoma of skin) 12/22/1994   right upper arm sholder  . BCC (basal cell carcinoma of skin) 08/11/1995   left leg below knee  . BCC (basal cell carcinoma of skin) 04/11/2002   mid back  . BCC (basal cell carcinoma of skin) 12/10/2002   right center upper back  . BCC (basal cell carcinoma of skin) 05/26/2005   right post sholder  . BCC (basal cell carcinoma) 05/09/2014   left inner shin  . BCC (basal cell carcinoma) 06/12/2014   left forearm  . Bowen's disease 10/07/1994   right post knee, right inner forearm/wrist  . Bowen's disease 08/11/1995   left sholder  . Cataract    left  . Chronic pain   . Depression   . Diverticulosis of colon 1998   mild  . DJD (degenerative joint disease)   . Duodenal ulcer    h/o  . GERD (gastroesophageal reflux disease)   . History of SCC (squamous cell carcinoma) of skin    Dr. Denna Haggard  . History of sinus bradycardia   . HLD (hyperlipidemia)    hypertriglyceridemia  . Hypertensive  retinopathy of both eyes 11/18/2017  . IBS (irritable bowel syndrome)   . Internal hemorrhoids 1998  . Mitral regurgitation    mild  . Osteoarthritis    feet,shoulder,neck,back,hips and hands.  . Pneumonia   . PONV (postoperative nausea and vomiting)   . Rotator cuff tear, right 02/2019   infraspinatus and supraspinatus, and dislocation of long head of bicep tendon (Dr. Alvan Dame)  . SCC (squamous cell carcinoma) 11/26/2014   left hand, right hand, right deltoid  . SCC (squamous cell carcinoma) 05/03/2017   left cheek  . Sleep apnea    uses a mouth guard nightly  . Squamous cell carcinoma in situ (SCCIS) 07/05/2018   left hand  . Vitamin D deficiency    mild    Past Surgical History:  Procedure Laterality Date  . ABDOMINAL HYSTERECTOMY    . BLEPHAROPLASTY Bilateral 01/2018  . BROW LIFT Bilateral  10/10/2018   Procedure: BROW LIFT;  Surgeon: Wallace Going, DO;  Location: [facility];  Service: Plastics;  Laterality: Bilateral;  . BUNIONECTOMY     R 12/08, L 2004 (Dr. Janus Molder)  . CARPAL TUNNEL RELEASE  1989   bilateral  . CATARACT EXTRACTION Bilateral Left in 08/2012, Right 04/2016   Dr.Hecker  . CESAREAN SECTION     x2  . CHOLECYSTECTOMY  1992  . CLOSED REDUCTION NASAL FRACTURE N/A 08/29/2018   Procedure: CLOSED REDUCTION NASAL FRACTURE;  Surgeon: Wallace Going, DO;  Location: [facility];  Service: Plastics;  Laterality: N/A;  1 hour, please  . COLONOSCOPY  2006  . COLONOSCOPY  01/2012   due again 01/2022; mild diverticulosis  . epidural steroid injection, back  02/2010  . HIP SURGERY     right bursectomy x 3  . HIP SURGERY Right 2000   torn cartilage, repaired  . INGUINAL HERNIA REPAIR  9/09   bilat  . NECK SURGERY  1989   c6-7 cervical laminectomy and diskecotmy  . NECK SURGERY  1989   per patient posterior area of neck  . REVERSE SHOULDER ARTHROPLASTY Right 04/11/2019   Procedure: REVERSE SHOULDER ARTHROPLASTY;  Surgeon: Nicholes Stairs, MD;  Location: [facility];  Service: Orthopedics;  Laterality: Right;  [facility]  . SHOULDER SURGERY  2/05   left rotator cuff repair  . SHOULDER SURGERY Left 03/14/2018   rotator cuff repair; Dr. Tonita Cong  . TONSILLECTOMY  age 62  . TOTAL ABDOMINAL HYSTERECTOMY W/ BILATERAL SALPINGOOPHORECTOMY  1997   fibroids  . TROCHANTERIC BURSA EXCISION Right 1978  . TUBAL LIGATION  1986  . WISDOM TOOTH EXTRACTION      Family History  Problem Relation Age of Onset  . Depression Mother   . Schizophrenia Mother   . Cancer Father        oral  . Heart disease Father        bradycardia  . Cancer Sister        skin and lung  . COPD Sister   . Hypertension Sister   . Osteoporosis Sister        compression fx's x 3 11/2018  . Hyperthyroidism Brother   . Cancer Brother        metastatic cancer  to bone (?primary)  . Hyperlipidemia Daughter   . Asthma Son   . Cancer Paternal Grandmother 57       colon cancer, metastatic to liver  . Colon cancer Paternal Grandmother   . Diabetes Maternal Grandmother   . ADD / ADHD  Other     Social History   Occupational History  . Occupation: STAFF Optician, dispensing: [employer]  Tobacco Use  . Smoking status: Never Smoker  . Smokeless tobacco: Never Used  Substance and Sexual Activity  . Alcohol use: Yes    Alcohol/week: 7.0 standard drinks    Types: 7 Glasses of wine per week    Comment: 1 glass of wine daily  . Drug use: No  . Sexual activity: Not Currently     Physical Exam: Blood pressure 120/74, pulse (!) 58, temperature 98.4 F (36.9 C), temperature source Oral, height 5\' 5"  (1.651 m), weight 163 lb (73.9 kg), SpO2 100 %.  Gen:      No acute distress Lungs:    No increased respiratory effort, symmetric chest wall excursion, clear to auscultation bilaterally, no wheezes or crackles CV:         Regular rate and rhythm; no murmurs, rubs, or gallops.  No pedal edema   Data Reviewed: Imaging: I have personally reviewed the CT Chest May 2021 which shows central and lower lobe bronchiectasis. TBM on expiratory cuts.   PFTs: Unable to view spirometry from April 2019 - reportedly normal.   Labs: Lab Results  Component Value Date   WBC 10.0 06/22/2019   HGB 14.1 06/22/2019   HCT 44.8 06/22/2019   MCV 102.1 (H) 06/22/2019   PLT 208 06/22/2019   Lab Results  Component Value Date   NA 142 06/22/2019   K 3.9 06/22/2019   CL 106 06/22/2019   CO2 28 06/22/2019     Immunization status: Immunization History  Administered Date(s) Administered  . Hepatitis A 06/17/2006, 01/05/2007  . IPV 04/15/2012  . Influenza Split 10/31/2013  . Influenza, High Dose Seasonal PF 11/16/2016, 11/16/2017, 11/01/2018  . Influenza,inj,quad, With Preservative 11/16/2017  . Influenza-Unspecified 11/09/2014, 11/14/2015  . PFIZER  SARS-COV-2 Vaccination 02/05/2019, 02/25/2019  . Pneumococcal Conjugate-13 07/20/2013  . Pneumococcal Polysaccharide-23 06/17/2006, 11/26/2014  . Tdap 09/16/2004, 10/08/2014  . Typhoid Live 06/17/2011, 06/10/2017  . Yellow Fever 07/08/2012  . Zoster 04/04/2009  . Zoster Recombinat (Shingrix) 04/28/2016, 08/15/2016    Assessment:  Bronchiectasis Possible Mild Persistent asthma OSA with excessive daytime sleepiness.  Chronic rhintis Tracheobronchomalacia (TBM)  Plan/Recommendations: Would obtain spirometry and Exhaled NO Ideally in bronchiectasis (as well as TBM) we like to avoid ICS as this worsens the risk for pneumonia.  She submitted all her sputum cultures but no AFB were run.  Will have her drop those off again. Start airway clearance with nebulized saline and flutter valve.  Will send serum immunoglobulins to complete bronchiectasis work up.  Split night study for her worsening excessive daytime sleepiness - suspect her oral appliance may not be enough.  I spent 45 minutes on 07/13/2019 in care of this patient including face to face time and non-face to face time spent charting, review of outside records, and coordination of care.  Return to Care: Return in about 4 months (around 11/13/2019).  Lenice Llamas, MD Pulmonary and [place]

## 2019-07-14 ENCOUNTER — Other Ambulatory Visit: Payer: Self-pay | Admitting: *Deleted

## 2019-07-14 ENCOUNTER — Other Ambulatory Visit: Payer: Medicare PPO

## 2019-07-14 DIAGNOSIS — J479 Bronchiectasis, uncomplicated: Secondary | ICD-10-CM | POA: Diagnosis not present

## 2019-07-15 LAB — IGG, IGA, IGM
IgG (Immunoglobin G), Serum: 465 mg/dL — ABNORMAL LOW (ref 600–1540)
IgM, Serum: 28 mg/dL — ABNORMAL LOW (ref 50–300)
Immunoglobulin A: 86 mg/dL (ref 70–320)

## 2019-07-18 ENCOUNTER — Other Ambulatory Visit: Payer: Medicare Other

## 2019-07-18 ENCOUNTER — Telehealth: Payer: Self-pay | Admitting: Internal Medicine

## 2019-07-18 DIAGNOSIS — D801 Nonfamilial hypogammaglobulinemia: Secondary | ICD-10-CM

## 2019-07-18 DIAGNOSIS — J479 Bronchiectasis, uncomplicated: Secondary | ICD-10-CM

## 2019-07-18 NOTE — Telephone Encounter (Signed)
Pt called back, please return call  

## 2019-07-18 NOTE — Telephone Encounter (Signed)
Error. already a message open

## 2019-07-18 NOTE — Telephone Encounter (Signed)
Patient called back spoke with her let her know Dr. Mauricio Po recommendations.  Patient has never seen immunology , Dr. Shearon Stalls can we put in this referral and if so what should the referral be for?  Thank you  Please advise Dr. Shearon Stalls

## 2019-07-18 NOTE — Telephone Encounter (Signed)
Please call patient:  Immunoglobulin (antibody) levels are low.  Sometimes we replace these levels if we think it is causing recurrent infections.  I would like her to see immunology before she sees me again in the fall to see if they would want to replace these levels, and if it would help her pulmonary symptoms.

## 2019-07-18 NOTE — Telephone Encounter (Signed)
Attempted to call patient, left message for her to return our call.

## 2019-07-18 NOTE — Telephone Encounter (Signed)
ATC patient unable to reach LM to call back office (x1)  

## 2019-07-20 ENCOUNTER — Ambulatory Visit: Payer: Medicare Other | Admitting: Family Medicine

## 2019-07-20 ENCOUNTER — Encounter (HOSPITAL_BASED_OUTPATIENT_CLINIC_OR_DEPARTMENT_OTHER): Payer: Self-pay

## 2019-07-21 ENCOUNTER — Other Ambulatory Visit: Payer: Medicare PPO

## 2019-07-21 DIAGNOSIS — J479 Bronchiectasis, uncomplicated: Secondary | ICD-10-CM

## 2019-07-21 LAB — AFB CULTURE WITH SMEAR (NOT AT ARMC)
Acid Fast Culture: NEGATIVE
Acid Fast Smear: NEGATIVE

## 2019-07-24 LAB — AFB CULTURE WITH SMEAR (NOT AT ARMC)
Acid Fast Culture: NEGATIVE
Acid Fast Smear: NEGATIVE

## 2019-07-26 DIAGNOSIS — G4733 Obstructive sleep apnea (adult) (pediatric): Secondary | ICD-10-CM | POA: Diagnosis not present

## 2019-07-28 ENCOUNTER — Other Ambulatory Visit: Payer: Self-pay | Admitting: *Deleted

## 2019-07-28 ENCOUNTER — Other Ambulatory Visit: Payer: Self-pay

## 2019-07-28 ENCOUNTER — Ambulatory Visit (INDEPENDENT_AMBULATORY_CARE_PROVIDER_SITE_OTHER): Payer: Medicare PPO

## 2019-07-28 DIAGNOSIS — J479 Bronchiectasis, uncomplicated: Secondary | ICD-10-CM

## 2019-07-28 LAB — NITRIC OXIDE: Nitric Oxide: 15

## 2019-07-29 ENCOUNTER — Other Ambulatory Visit (HOSPITAL_COMMUNITY)
Admission: RE | Admit: 2019-07-29 | Discharge: 2019-07-29 | Disposition: A | Discharge status: Home / Self Care | Payer: Medicare PPO | Source: Ambulatory Visit | Attending: Pulmonary Disease | Admitting: Pulmonary Disease

## 2019-07-29 DIAGNOSIS — Z20822 Contact with and (suspected) exposure to covid-19: Secondary | ICD-10-CM | POA: Diagnosis not present

## 2019-07-29 DIAGNOSIS — Z01812 Encounter for preprocedural laboratory examination: Secondary | ICD-10-CM | POA: Insufficient documentation

## 2019-07-29 LAB — SARS CORONAVIRUS 2 (TAT 6-24 HRS): SARS Coronavirus 2: NEGATIVE

## 2019-07-31 ENCOUNTER — Ambulatory Visit (HOSPITAL_BASED_OUTPATIENT_CLINIC_OR_DEPARTMENT_OTHER): Payer: Medicare PPO | Attending: Internal Medicine | Admitting: Pulmonary Disease

## 2019-07-31 ENCOUNTER — Other Ambulatory Visit: Payer: Self-pay

## 2019-07-31 ENCOUNTER — Telehealth: Payer: Self-pay | Admitting: Internal Medicine

## 2019-07-31 ENCOUNTER — Other Ambulatory Visit: Payer: Self-pay | Admitting: *Deleted

## 2019-07-31 DIAGNOSIS — R0683 Snoring: Secondary | ICD-10-CM | POA: Insufficient documentation

## 2019-07-31 DIAGNOSIS — R0681 Apnea, not elsewhere classified: Secondary | ICD-10-CM | POA: Insufficient documentation

## 2019-07-31 DIAGNOSIS — G4710 Hypersomnia, unspecified: Secondary | ICD-10-CM | POA: Insufficient documentation

## 2019-07-31 DIAGNOSIS — J479 Bronchiectasis, uncomplicated: Secondary | ICD-10-CM

## 2019-07-31 DIAGNOSIS — R5383 Other fatigue: Secondary | ICD-10-CM | POA: Diagnosis not present

## 2019-07-31 DIAGNOSIS — G4733 Obstructive sleep apnea (adult) (pediatric): Secondary | ICD-10-CM | POA: Diagnosis present

## 2019-07-31 NOTE — Telephone Encounter (Signed)
Called and spoke with patient. She stated that a nebulizer order was sent to [facility] back in late May. She has not heard anything from [facility]. She attempted to call them last week but was told the wait time was over 20 minutes. She wants the order to be sent to another DME.   PCCs, can we send the order to another DME? Thanks!

## 2019-07-31 NOTE — Telephone Encounter (Signed)
We can or you can provide the patient with our  customer service number, [phone].  If pt doesn't want to call that number, then a new order will need to be placed.

## 2019-07-31 NOTE — Telephone Encounter (Signed)
Spoke with patient, she is not happy with [facility] and wants to get her nebulizer through another company.  She has already tried the customer service number. She would like a new order sent over to [facility].  I will put in the order.  Patient will let me know if she does not hear from [facility].

## 2019-08-01 ENCOUNTER — Telehealth: Payer: Self-pay | Admitting: Internal Medicine

## 2019-08-01 NOTE — Telephone Encounter (Signed)
Beverly [name] from [facility] was here and I was able to ask her if they used [place] to ship neb machines and she stated yes they did. I called Beverly [name] and told her she was correct the package should be her neb machine from [facility]

## 2019-08-04 ENCOUNTER — Telehealth: Payer: Self-pay | Admitting: Pulmonary Disease

## 2019-08-04 DIAGNOSIS — G4733 Obstructive sleep apnea (adult) (pediatric): Secondary | ICD-10-CM | POA: Diagnosis not present

## 2019-08-04 DIAGNOSIS — J479 Bronchiectasis, uncomplicated: Secondary | ICD-10-CM | POA: Diagnosis not present

## 2019-08-04 DIAGNOSIS — J310 Chronic rhinitis: Secondary | ICD-10-CM | POA: Diagnosis not present

## 2019-08-04 NOTE — Telephone Encounter (Signed)
Patient of Dr. Shearon Stalls.  Please let patient know her sleep study did not show sleep apnea.

## 2019-08-04 NOTE — Telephone Encounter (Signed)
ATC patient per DPR left detailed message. Nothing further needed at this time. 

## 2019-08-04 NOTE — Procedures (Signed)
    Patient Name: Beverly [name], Beverly [name] Date: 07/31/2019 Gender: Female D.O.B: September 12, 1945 Age (years): 29 Referring Provider: Lenice Llamas MD Height (inches): 65 Interpreting Physician: Chesley Mires MD, ABSM Weight (lbs): 163 RPSGT: Laren Everts BMI: 27 MRN: [id number] Neck Size: 13.75  CLINICAL INFORMATION Sleep Study Type: NPSG  Indication for sleep study: Excessive Daytime Sleepiness, Fatigue, Re-Evaluation, Snoring, Witnessed Apneas  Most recent polysomnogram dated 08/25/2016 revealed an AHI of 3.6/h and RDI of 9.5/h. SLEEP STUDY TECHNIQUE As per the AASM Manual for the Scoring of Sleep and Associated Events v2.3 (April 2016) with a hypopnea requiring 4% desaturations.  The channels recorded and monitored were frontal, central and occipital EEG, electrooculogram (EOG), submentalis EMG (chin), nasal and oral airflow, thoracic and abdominal wall motion, anterior tibialis EMG, snore microphone, electrocardiogram, and pulse oximetry.  MEDICATIONS Medications self-administered by patient taken the night of the study : PAXIL, TYLENOL, CELEBREX, Calcium 600 D, ROBAXIN, Lovaza, ASPIRIN, NEURONTIN, Atorvastain  SLEEP ARCHITECTURE The study was initiated at 10:18:10 PM and ended at 4:49:24 AM.  Sleep onset time was 25.8 minutes and the sleep efficiency was 65.6%%. The total sleep time was 256.5 minutes.  Stage REM latency was 336.0 minutes.  The patient spent 22.8%% of the night in stage N1 sleep, 71.5%% in stage N2 sleep, 0.0%% in stage N3 and 5.7% in REM.  Alpha intrusion was absent.  Supine sleep was 0.00%.  RESPIRATORY PARAMETERS The overall apnea/hypopnea index (AHI) was 1.9 per hour. There were 3 total apneas, including 0 obstructive, 3 central and 0 mixed apneas. There were 5 hypopneas and 33 RERAs.  The AHI during Stage REM sleep was 0.0 per hour.  AHI while supine was N/A per hour.  The mean oxygen saturation was 93.7%. The minimum SpO2 during sleep was  89.0%.  soft snoring was noted during this study.  CARDIAC DATA The 2 lead EKG demonstrated sinus rhythm. The mean heart rate was 52.7 beats per minute. Other EKG findings include: PVCs.  LEG MOVEMENT DATA The total PLMS were 0 with a resulting PLMS index of 0.0. Associated arousal with leg movement index was 0.0 .  IMPRESSIONS - No significant obstructive sleep apnea occurred during this study (AHI = 1.9/h). - No significant central sleep apnea occurred during this study (CAI = 0.7/h). - The patient had minimal or no oxygen desaturation during the study (Min O2 = 89.0%) - The patient snored with soft snoring volume.  DIAGNOSIS - Snoring.  RECOMMENDATIONS - Avoid alcohol, sedatives and other CNS depressants that may worsen sleep apnea and disrupt normal sleep architecture. - Sleep hygiene should be reviewed to assess factors that may improve sleep quality. - Weight management and regular exercise should be initiated or continued if appropriate.  [Electronically signed] 08/04/2019 05:11 PM  Chesley Mires MD, [place], American Board of Sleep Medicine   NPI: [id number]

## 2019-08-08 ENCOUNTER — Other Ambulatory Visit: Payer: Self-pay

## 2019-08-08 MED FILL — GABAPENTIN 800 MG TABS: 800 | 90 days supply | Qty: 90 | Fill #0

## 2019-08-09 ENCOUNTER — Encounter: Payer: Self-pay | Admitting: Internal Medicine

## 2019-08-09 ENCOUNTER — Ambulatory Visit (INDEPENDENT_AMBULATORY_CARE_PROVIDER_SITE_OTHER): Payer: Medicare PPO | Admitting: Internal Medicine

## 2019-08-09 VITALS — BP 110/70 | HR 84 | Temp 98.0°F | Ht 65.0 in | Wt 162.4 lb

## 2019-08-09 DIAGNOSIS — G4733 Obstructive sleep apnea (adult) (pediatric): Secondary | ICD-10-CM

## 2019-08-09 DIAGNOSIS — J479 Bronchiectasis, uncomplicated: Secondary | ICD-10-CM

## 2019-08-09 DIAGNOSIS — E782 Mixed hyperlipidemia: Secondary | ICD-10-CM

## 2019-08-09 DIAGNOSIS — J4540 Moderate persistent asthma, uncomplicated: Secondary | ICD-10-CM | POA: Diagnosis not present

## 2019-08-09 DIAGNOSIS — M549 Dorsalgia, unspecified: Secondary | ICD-10-CM

## 2019-08-09 DIAGNOSIS — G8929 Other chronic pain: Secondary | ICD-10-CM

## 2019-08-09 DIAGNOSIS — J398 Other specified diseases of upper respiratory tract: Secondary | ICD-10-CM

## 2019-08-09 DIAGNOSIS — K219 Gastro-esophageal reflux disease without esophagitis: Secondary | ICD-10-CM

## 2019-08-09 NOTE — Patient Instructions (Signed)
-  Nice seeing you today!!  -Schedule follow up for your physical/wellness visit in 3-4 months or earlier at your convenience. Please come in fasting that day.

## 2019-08-09 NOTE — Progress Notes (Signed)
New Patient Office Visit     This visit occurred during the SARS-CoV-2 public health emergency.  Safety protocols were in place, including screening questions prior to the visit, additional usage of staff PPE, and extensive cleaning of exam room while observing appropriate contact time as indicated for disinfecting solutions.    CC/Reason for Visit: Establish care, discuss chronic conditions Previous PCP: Rita Ohara, MD Last Visit: May 2021  HPI: Beverly [name] is a 74 y.o. female who is coming in today for the above mentioned reasons. Past Medical History is significant for: Hyperlipidemia, GERD, asthma, obstructive sleep apnea managed by an oral device, chronic back pain on fentanyl patches and as needed hydrocodone prescribed by Dr. Hardin Negus, she has recently been diagnosed with bronchiectasis and tracheal bronchomalacia and is being followed by pulmonary.  She had a hospitalization earlier this year for chest pain and a stress test has resulted normal.  She follows with cardiology.  She has no acute complaints today.  She is not a smoker, she drinks alcohol occasionally, her father was a smoker and died from oral cancer, paternal grandmother had colon cancer and maternal grandmother had type 2 diabetes.  She has had a complete hysterectomy including removal of her cervix so no longer has Pap smears.  Her mammogram was last done in October 2020 and was negative, she had a colonoscopy in 2013 and is a 10-year callback.  She has had both of her Covid vaccines, both of her shingles vaccines.   Past Medical/Surgical History: Past Medical History:  Diagnosis Date  . Allergy   . Amaurosis fugax    negative w/u through [facility]  . Asthma   . Back pain    Dr Joline Salt 02/2010-epidural injection x 2 at L4-5 with good effect  . 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 BCC (basal cell carcinoma of skin) 05/09/2014   mid lower back  . BCC (basal cell carcinoma of skin)  05/03/2017   right low back  . BCC (basal cell carcinoma of skin) 05/03/2017   left upper back  . BCC (basal cell carcinoma of skin) 07/05/2018   left mid back  . BCC (basal cell carcinoma of skin) 05/20/1992   upper back  . BCC (basal cell carcinoma of skin) 07/29/1993   left sholder medial  . BCC (basal cell carcinoma of skin) 07/29/1993   left sholder lateral  . BCC (basal cell carcinoma of skin) 07/29/1993   right thigh  . BCC (basal cell carcinoma of skin) 07/29/1993   right sholder  . BCC (basal cell carcinoma of skin) 12/22/1994   right mid forearm  . BCC (basal cell carcinoma of skin) 12/22/1994   right upper forearm  . BCC (basal cell carcinoma of skin) 12/22/1994   lower right upper arm  . BCC (basal cell carcinoma of skin) 12/22/1994   right upper arm sholder  . BCC (basal cell carcinoma of skin) 08/11/1995   left leg below knee  . BCC (basal cell carcinoma of skin) 04/11/2002   mid back  . BCC (basal cell carcinoma of skin) 12/10/2002   right center upper back  . BCC (basal cell carcinoma of skin) 05/26/2005   right post sholder  . BCC (basal cell carcinoma) 05/09/2014   left inner shin  . BCC (basal cell carcinoma) 06/12/2014   left forearm  . Bowen's disease 10/07/1994   right post knee, right inner forearm/wrist  . Bowen's disease 08/11/1995   left sholder  .  Cataract    left  . Chronic pain   . Depression   . Diverticulosis of colon 1998   mild  . DJD (degenerative joint disease)   . Duodenal ulcer    h/o  . GERD (gastroesophageal reflux disease)   . History of SCC (squamous cell carcinoma) of skin    Dr. Denna Haggard  . History of sinus bradycardia   . HLD (hyperlipidemia)    hypertriglyceridemia  . Hypertensive retinopathy of both eyes 11/18/2017  . IBS (irritable bowel syndrome)   . Internal hemorrhoids 1998  . Mitral regurgitation    mild  . Osteoarthritis    feet,shoulder,neck,back,hips and hands.  . Pneumonia   . PONV (postoperative  nausea and vomiting)   . Rotator cuff tear, right 02/2019   infraspinatus and supraspinatus, and dislocation of long head of bicep tendon (Dr. Alvan Dame)  . SCC (squamous cell carcinoma) 11/26/2014   left hand, right hand, right deltoid  . SCC (squamous cell carcinoma) 05/03/2017   left cheek  . Sleep apnea    uses a mouth guard nightly  . Squamous cell carcinoma in situ (SCCIS) 07/05/2018   left hand  . Vitamin D deficiency    mild    Past Surgical History:  Procedure Laterality Date  . ABDOMINAL HYSTERECTOMY    . BLEPHAROPLASTY Bilateral 01/2018  . BROW LIFT Bilateral 10/10/2018   Procedure: BROW LIFT;  Surgeon: Wallace Going, DO;  Location: [facility];  Service: Plastics;  Laterality: Bilateral;  . BUNIONECTOMY     R 12/08, L 2004 (Dr. Janus Molder)  . CARPAL TUNNEL RELEASE  1989   bilateral  . CATARACT EXTRACTION Bilateral Left in 08/2012, Right 04/2016   Dr.Hecker  . CESAREAN SECTION     x2  . CHOLECYSTECTOMY  1992  . CLOSED REDUCTION NASAL FRACTURE N/A 08/29/2018   Procedure: CLOSED REDUCTION NASAL FRACTURE;  Surgeon: Wallace Going, DO;  Location: [facility];  Service: Plastics;  Laterality: N/A;  1 hour, please  . COLONOSCOPY  2006  . COLONOSCOPY  01/2012   due again 01/2022; mild diverticulosis  . epidural steroid injection, back  02/2010  . HIP SURGERY     right bursectomy x 3  . HIP SURGERY Right 2000   torn cartilage, repaired  . INGUINAL HERNIA REPAIR  9/09   bilat  . NECK SURGERY  1989   c6-7 cervical laminectomy and diskecotmy  . NECK SURGERY  1989   per patient posterior area of neck  . REVERSE SHOULDER ARTHROPLASTY Right 04/11/2019   Procedure: REVERSE SHOULDER ARTHROPLASTY;  Surgeon: Nicholes Stairs, MD;  Location: [facility];  Service: Orthopedics;  Laterality: Right;  [facility] Block  . SHOULDER SURGERY  2/05   left rotator cuff repair  . SHOULDER SURGERY Left 03/14/2018   rotator cuff repair; Dr. Tonita Cong  .  TONSILLECTOMY  age 17  . TOTAL ABDOMINAL HYSTERECTOMY W/ BILATERAL SALPINGOOPHORECTOMY  1997   fibroids  . TROCHANTERIC BURSA EXCISION Right 1978  . TUBAL LIGATION  1986  . WISDOM TOOTH EXTRACTION      Social History:  reports that she has never smoked. She has never used smokeless tobacco. She reports current alcohol use of about 7.0 standard drinks of alcohol per week. She reports that she does not use drugs.  Allergies: Allergies  Allergen Reactions  . Adhesive [Tape] Rash  . Codeine Rash    Family History:  Family History  Problem Relation Age of Onset  . Depression Mother   .  Schizophrenia Mother   . Cancer Father        oral  . Heart disease Father        bradycardia  . Cancer Sister        skin and lung  . COPD Sister   . Hypertension Sister   . Osteoporosis Sister        compression fx's x 3 11/2018  . Hyperthyroidism Brother   . Cancer Brother        metastatic cancer to bone (?primary)  . Hyperlipidemia Daughter   . Asthma Son   . Cancer Paternal Grandmother 2       colon cancer, metastatic to liver  . Colon cancer Paternal Grandmother   . Diabetes Maternal Grandmother   . ADD / ADHD Other      Current Outpatient Medications:  .  acetaminophen (TYLENOL) 500 MG tablet, Take 1,000 mg by mouth every 6 (six) hours as needed for moderate pain. , Disp: , Rfl:  .  albuterol (PROVENTIL HFA;VENTOLIN HFA) 108 (90 Base) MCG/ACT inhaler, Inhale 2 puffs into the lungs every 6 (six) hours as needed for wheezing or shortness of breath., Disp: 18 g, Rfl: 1 .  amoxicillin (AMOXIL) 500 MG tablet, Take 2,000 mg by mouth as directed. For dental appointments, Disp: , Rfl:  .  aspirin EC 81 MG tablet, Take 81 mg by mouth daily., Disp: , Rfl:  .  atorvastatin (LIPITOR) 40 MG tablet, Take 1 tablet (40 mg total) by mouth daily., Disp: 90 tablet, Rfl: 1 .  Calcium Carbonate-Vitamin D (CALCIUM 600+D) 600-200 MG-UNIT TABS, Take 1 tablet by mouth 2 (two) times daily.  , Disp: , Rfl:   .  celecoxib (CELEBREX) 200 MG capsule, Take 1 capsule (200 mg total) by mouth 2 (two) times daily., Disp: 180 capsule, Rfl: 1 .  diclofenac Sodium (VOLTAREN) 1 % GEL, Apply 2 g topically 4 (four) times daily as needed (joint pain). , Disp: , Rfl:  .  esomeprazole (NEXIUM) 40 MG capsule, TAKE 1 CAPSULE BY MOUTH DAILY BEFORE SUPPER., Disp: 90 capsule, Rfl: 0 .  fentaNYL (DURAGESIC - DOSED MCG/HR) 25 MCG/HR, Place 1 patch onto the skin every 3 (three) days. Dr Hardin Negus, Disp: , Rfl:  .  Fluorouracil (TOLAK) 4 % CREA, Apply 1 application topically every 3 (three) days. Dr Denna Haggard, Disp: , Rfl:  .  gabapentin (NEURONTIN) 800 MG tablet, Take 800 mg by mouth at bedtime. , Disp: , Rfl:  .  GUAIFENESIN ER PO, Take 400 mg by mouth 2 (two) times daily. , Disp: , Rfl:  .  HYDROcodone-acetaminophen (NORCO) 7.5-325 MG tablet, Take 1 tablet by mouth every 12 (twelve) hours as needed for moderate pain. Dr Hardin Negus, Disp: , Rfl:  .  loperamide (IMODIUM A-D) 2 MG tablet, Take 2 mg by mouth 4 (four) times daily as needed for diarrhea or loose stools., Disp: , Rfl:  .  methocarbamol (ROBAXIN) 500 MG tablet, Take 500 mg by mouth in the morning and at bedtime. , Disp: , Rfl:  .  montelukast (SINGULAIR) 10 MG tablet, Take 1 tablet (10 mg total) by mouth at bedtime., Disp: 90 tablet, Rfl: 3 .  Multiple Vitamins-Minerals (MULTIVITAMIN WITH MINERALS) tablet, Take 1 tablet by mouth daily., Disp: , Rfl:  .  niacinamide 500 MG tablet, Take 500 mg by mouth 2 (two) times daily with a meal., Disp: , Rfl:  .  omega-3 acid ethyl esters (LOVAZA) 1 g capsule, TAKE 2 CAPSULES BY MOUTH 2 TIMES DAILY.,  Disp: 360 capsule, Rfl: 0 .  PARoxetine (PAXIL) 30 MG tablet, Take 60 mg by mouth daily. , Disp: , Rfl:  .  Respiratory Therapy Supplies (FLUTTER) DEVI, 1 puff by Does not apply route 2 (two) times daily., Disp: 1 each, Rfl: 0 .  sodium chloride HYPERTONIC 3 % nebulizer solution, Take by nebulization in the morning and at bedtime., Disp:  750 mL, Rfl: 12  Review of Systems:  Constitutional: Denies fever, chills, diaphoresis, appetite change and fatigue.  HEENT: Denies photophobia, eye pain, redness, hearing loss, ear pain, congestion, sore throat, rhinorrhea, sneezing, mouth sores, trouble swallowing, neck pain, neck stiffness and tinnitus.   Respiratory: Denies SOB, DOE, cough, chest tightness,  and wheezing.   Cardiovascular: Denies chest pain, palpitations and leg swelling.  Gastrointestinal: Denies nausea, vomiting, abdominal pain, diarrhea, constipation, blood in stool and abdominal distention.  Genitourinary: Denies dysuria, urgency, frequency, hematuria, flank pain and difficulty urinating.  Endocrine: Denies: hot or cold intolerance, sweats, changes in hair or nails, polyuria, polydipsia. Musculoskeletal: Denies myalgias, back pain, joint swelling, arthralgias and gait problem.  Skin: Denies pallor, rash and wound.  Neurological: Denies dizziness, seizures, syncope, weakness, light-headedness, numbness and headaches.  Hematological: Denies adenopathy. Easy bruising, personal or family bleeding history  Psychiatric/Behavioral: Denies suicidal ideation, mood changes, confusion, nervousness, sleep disturbance and agitation    Physical Exam: Vitals:   08/09/19 1501  BP: 110/70  Pulse: 84  Temp: 98 F (36.7 C)  TempSrc: Temporal  SpO2: 97%  Weight: 162 lb 6.4 oz (73.7 kg)  Height: 5\' 5"  (1.651 m)   Body mass index is 27.02 kg/m.  Constitutional: NAD, calm, comfortable Eyes: PERRL, lids and conjunctivae normal ENMT: Mucous membranes are moist.  Respiratory: clear to auscultation bilaterally, no wheezing, no crackles. Normal respiratory effort. No accessory muscle use.  Cardiovascular: Regular rate and rhythm, no murmurs / rubs / gallops. No extremity edema. Neurologic: Grossly intact and nonfocal Psychiatric: Normal judgment and insight. Alert and oriented x 3. Normal mood.    Impression and Plan:  OSA  (obstructive sleep apnea) -Managed by an oral device prescribed by her dentist, recently had sleep test.  Moderate persistent asthma, unspecified whether complicated -Well-controlled, off high-dose inhaled steroids given concern that these may be contributing to tracheobronchomalacia. -Followed by pulmonary.  Gastroesophageal reflux disease without esophagitis -Well-controlled on daily PPI therapy.  Mixed hyperlipidemia -Well-controlled with an LDL cholesterol of 73 in May 2021.  Chronic back pain, unspecified back location, unspecified back pain laterality -On chronic fentanyl patches and hydrocodone prescribed by Dr. Hardin Negus.  Bronchiectasis without complication (HCC) Tracheobronchomalacia -Work-up currently being undertaken by pulmonary. -Off high-dose inhaled corticosteroids.     Patient Instructions  -Nice seeing you today!!  -Schedule follow up for your physical/wellness visit in 3-4 months or earlier at your convenience. Please come in fasting that day.     Lelon Frohlich, MD [facility]

## 2019-08-11 MED FILL — fentaNYL 25 MCG/HR PT72: 25 | 30 days supply | Qty: 10 | Fill #0

## 2019-08-15 ENCOUNTER — Encounter: Payer: Self-pay | Admitting: Internal Medicine

## 2019-08-15 DIAGNOSIS — M25511 Pain in right shoulder: Secondary | ICD-10-CM | POA: Diagnosis not present

## 2019-08-15 DIAGNOSIS — E782 Mixed hyperlipidemia: Secondary | ICD-10-CM

## 2019-08-15 DIAGNOSIS — J45998 Other asthma: Secondary | ICD-10-CM

## 2019-08-15 DIAGNOSIS — J309 Allergic rhinitis, unspecified: Secondary | ICD-10-CM

## 2019-08-16 ENCOUNTER — Other Ambulatory Visit: Payer: Self-pay | Admitting: Internal Medicine

## 2019-08-16 MED ORDER — ATORVASTATIN CALCIUM 40 MG PO TABS: 40.0000 mg | ORAL_TABLET | Freq: Every day | ORAL | 1 refills | Status: DC

## 2019-08-16 MED ORDER — MONTELUKAST SODIUM 10 MG PO TABS: 10.0000 mg | ORAL_TABLET | Freq: Every day | ORAL | 1 refills | Status: DC

## 2019-08-16 MED FILL — MONTELUKAST SOD 10 MG TAB: 10 | 90 days supply | Qty: 90 | Fill #0

## 2019-08-16 MED FILL — ATORVASTATIN CALCIUM 40 MG: 40 | 90 days supply | Qty: 90 | Fill #0

## 2019-08-22 ENCOUNTER — Ambulatory Visit: Payer: Medicare PPO | Admitting: Allergy and Immunology

## 2019-08-22 ENCOUNTER — Encounter: Payer: Self-pay | Admitting: Allergy and Immunology

## 2019-08-22 ENCOUNTER — Other Ambulatory Visit: Payer: Self-pay

## 2019-08-22 VITALS — BP 116/64 | HR 63 | Resp 16 | Ht 64.5 in | Wt 163.4 lb

## 2019-08-22 DIAGNOSIS — J4530 Mild persistent asthma, uncomplicated: Secondary | ICD-10-CM

## 2019-08-22 DIAGNOSIS — J479 Bronchiectasis, uncomplicated: Secondary | ICD-10-CM | POA: Diagnosis not present

## 2019-08-22 DIAGNOSIS — K219 Gastro-esophageal reflux disease without esophagitis: Secondary | ICD-10-CM

## 2019-08-22 DIAGNOSIS — D801 Nonfamilial hypogammaglobulinemia: Secondary | ICD-10-CM

## 2019-08-22 MED ORDER — FAMOTIDINE 40 MG PO TABS
40.0000 mg | ORAL_TABLET | Freq: Every day | ORAL | 5 refills | Status: DC
Start: 2019-08-22 — End: 2019-09-04

## 2019-08-22 MED ORDER — ESOMEPRAZOLE MAGNESIUM 40 MG PO CPDR: DELAYED_RELEASE_CAPSULE | ORAL | 5 refills | Status: DC

## 2019-08-22 MED FILL — FAMOTIDINE 40 MG TABS: 40 | 30 days supply | Qty: 30 | Fill #0

## 2019-08-22 NOTE — Progress Notes (Signed)
[facility] [facility] [place] [facility] [place] [facility] [place] [facility] [place]   Dear Dr. Shearon Stalls,  Thank you for referring Beverly [name] [name] to the [facility] on 08/22/2019.   Below is a summation of this patient's evaluation and recommendations.  Thank you for your referral. I will keep you informed about this patient's response to treatment.   If you have any questions please do not hesitate to contact me.   Sincerely,  Jiles Prows, MD Allergy / Immunology [facility]   ______________________________________________________________________    NEW PATIENT NOTE  Referring Provider: Spero Geralds, MD Primary Provider: Isaac Bliss, Rayford Halsted, MD Date of office visit: 08/22/2019    Subjective:   Chief Complaint:  Beverly [name] [name] (DOB: 09/05/1945) is a 74 y.o. female who presents to the clinic on 08/22/2019 with a chief complaint of No chief complaint on file. Marland Kitchen     HPI: Beverly [name] presents to this clinic in evaluation of hypogammaglobulinemia.  She states that she has a 3-year history of a productive cough that has been a persistent issue that was recently evaluated by pulmonology who documented bronchiectasis and a low IgG level of 465 MG/DL on 14 Jul 2019.    She also appears to have a component of LPR with some constant drainage in her throat and throat clearing.  She has indigestion/regurgitation events less than 1 time per week while continuing to use Nexium on a consistent basis.  She drinks approximately 12 ounces of coffee in the morning and has 6 ounces of wine a few times per week.  Other than chronic cough and symptoms consistent with LPR she does not have any symptoms to suggest ongoing infections in any other organ system other than her respiratory tract.  She does not really have a tremendous amount of upper airway symptoms with a lack of sneezing or nose blowing and nasal congestion and  there is no history of anosmia or headaches.  She has received 2 Pfizer Covid vaccinations in 2021.  Past Medical History:  Diagnosis Date  . Allergy   . Amaurosis fugax    negative w/u through [facility]  . Asthma   . Back pain    Dr Joline Salt 02/2010-epidural injection x 2 at L4-5 with good effect  . 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 BCC (basal cell carcinoma of skin) 05/09/2014   mid lower back  . BCC (basal cell carcinoma of skin) 05/03/2017   right low back  . BCC (basal cell carcinoma of skin) 05/03/2017   left upper back  . BCC (basal cell carcinoma of skin) 07/05/2018   left mid back  . BCC (basal cell carcinoma of skin) 05/20/1992   upper back  . BCC (basal cell carcinoma of skin) 07/29/1993   left sholder medial  . BCC (basal cell carcinoma of skin) 07/29/1993   left sholder lateral  . BCC (basal cell carcinoma of skin) 07/29/1993   right thigh  . BCC (basal cell carcinoma of skin) 07/29/1993   right sholder  . BCC (basal cell carcinoma of skin) 12/22/1994   right mid forearm  . BCC (basal cell carcinoma of skin) 12/22/1994   right upper forearm  . BCC (basal cell carcinoma of skin) 12/22/1994   lower right upper arm  . BCC (basal cell carcinoma of skin) 12/22/1994   right upper arm sholder  . BCC (basal cell carcinoma of skin) 08/11/1995  left leg below knee  . BCC (basal cell carcinoma of skin) 04/11/2002   mid back  . BCC (basal cell carcinoma of skin) 12/10/2002   right center upper back  . BCC (basal cell carcinoma of skin) 05/26/2005   right post sholder  . BCC (basal cell carcinoma) 05/09/2014   left inner shin  . BCC (basal cell carcinoma) 06/12/2014   left forearm  . Bowen's disease 10/07/1994   right post knee, right inner forearm/wrist  . Bowen's disease 08/11/1995   left sholder  . Cataract    left  . Chronic pain   . Depression   . Diverticulosis of colon 1998   mild  . DJD (degenerative joint disease)   . Duodenal  ulcer    h/o  . GERD (gastroesophageal reflux disease)   . History of SCC (squamous cell carcinoma) of skin    Dr. Denna Haggard  . History of sinus bradycardia   . HLD (hyperlipidemia)    hypertriglyceridemia  . Hypertensive retinopathy of both eyes 11/18/2017  . IBS (irritable bowel syndrome)   . Internal hemorrhoids 1998  . Mitral regurgitation    mild  . Osteoarthritis    feet,shoulder,neck,back,hips and hands.  . Pneumonia   . PONV (postoperative nausea and vomiting)   . Rotator cuff tear, right 02/2019   infraspinatus and supraspinatus, and dislocation of long head of bicep tendon (Dr. Alvan Dame)  . SCC (squamous cell carcinoma) 11/26/2014   left hand, right hand, right deltoid  . SCC (squamous cell carcinoma) 05/03/2017   left cheek  . Sleep apnea    uses a mouth guard nightly  . Squamous cell carcinoma in situ (SCCIS) 07/05/2018   left hand  . Vitamin D deficiency    mild    Past Surgical History:  Procedure Laterality Date  . ABDOMINAL HYSTERECTOMY    . BLEPHAROPLASTY Bilateral 01/2018  . BROW LIFT Bilateral 10/10/2018   Procedure: BROW LIFT;  Surgeon: Wallace Going, DO;  Location: [facility];  Service: Plastics;  Laterality: Bilateral;  . BUNIONECTOMY     R 12/08, L 2004 (Dr. Janus Molder)  . CARPAL TUNNEL RELEASE  1989   bilateral  . CATARACT EXTRACTION Bilateral Left in 08/2012, Right 04/2016   Dr.Hecker  . CESAREAN SECTION     x2  . CHOLECYSTECTOMY  1992  . CLOSED REDUCTION NASAL FRACTURE N/A 08/29/2018   Procedure: CLOSED REDUCTION NASAL FRACTURE;  Surgeon: Wallace Going, DO;  Location: [facility];  Service: Plastics;  Laterality: N/A;  1 hour, please  . COLONOSCOPY  2006  . COLONOSCOPY  01/2012   due again 01/2022; mild diverticulosis  . epidural steroid injection, back  02/2010  . HIP SURGERY     right bursectomy x 3  . HIP SURGERY Right 2000   torn cartilage, repaired  . INGUINAL HERNIA REPAIR  9/09   bilat  . NECK  SURGERY  1989   c6-7 cervical laminectomy and diskecotmy  . NECK SURGERY  1989   per patient posterior area of neck  . REVERSE SHOULDER ARTHROPLASTY Right 04/11/2019   Procedure: REVERSE SHOULDER ARTHROPLASTY;  Surgeon: Nicholes Stairs, MD;  Location: [facility];  Service: Orthopedics;  Laterality: Right;  [facility]  . SHOULDER SURGERY  2/05   left rotator cuff repair  . SHOULDER SURGERY Left 03/14/2018   rotator cuff repair; Dr. Tonita Cong  . TONSILLECTOMY  age 81  . TOTAL ABDOMINAL HYSTERECTOMY W/ BILATERAL SALPINGOOPHORECTOMY  1997   fibroids  .  TROCHANTERIC BURSA EXCISION Right 1978  . TUBAL LIGATION  1986  . WISDOM TOOTH EXTRACTION      Allergies as of 08/22/2019      Reactions   Adhesive [tape] Rash   Codeine Rash      Medication List      acetaminophen 500 MG tablet Commonly known as: TYLENOL Take 1,000 mg by mouth every 6 (six) hours as needed for moderate pain.   albuterol 108 (90 Base) MCG/ACT inhaler Commonly known as: VENTOLIN HFA Inhale 2 puffs into the lungs every 6 (six) hours as needed for wheezing or shortness of breath.   amoxicillin 500 MG tablet Commonly known as: AMOXIL Take 2,000 mg by mouth as directed. For dental appointments   aspirin EC 81 MG tablet Take 81 mg by mouth daily.   atorvastatin 40 MG tablet Commonly known as: LIPITOR Take 1 tablet (40 mg total) by mouth daily.   Calcium 600+D 600-200 MG-UNIT Tabs Generic drug: Calcium Carbonate-Vitamin D Take 1 tablet by mouth 2 (two) times daily.   celecoxib 200 MG capsule Commonly known as: CELEBREX Take 1 capsule (200 mg total) by mouth 2 (two) times daily.   diclofenac Sodium 1 % Gel Commonly known as: VOLTAREN Apply 2 g topically 4 (four) times daily as needed (joint pain).   esomeprazole 40 MG capsule Commonly known as: NEXIUM TAKE 1 CAPSULE BY MOUTH DAILY BEFORE SUPPER.   fentaNYL 25 MCG/HR Commonly known as: [place] 1 patch onto the skin every 3 (three) days. Dr  Hardin Negus   Flutter Devi 1 puff by Does not apply route 2 (two) times daily.   GUAIFENESIN ER PO Take 400 mg by mouth 2 (two) times daily.   HYDROcodone-acetaminophen 7.5-325 MG tablet Commonly known as: NORCO Take 1 tablet by mouth every 12 (twelve) hours as needed for moderate pain. Dr Hardin Negus   loperamide 2 MG tablet Commonly known as: IMODIUM A-D Take 2 mg by mouth 4 (four) times daily as needed for diarrhea or loose stools.   methocarbamol 500 MG tablet Commonly known as: ROBAXIN Take 500 mg by mouth in the morning and at bedtime.   montelukast 10 MG tablet Commonly known as: SINGULAIR Take 1 tablet (10 mg total) by mouth at bedtime.   multivitamin with minerals tablet Take 1 tablet by mouth daily.   Neurontin 800 MG tablet Generic drug: gabapentin Take 800 mg by mouth at bedtime.   niacinamide 500 MG tablet Take 500 mg by mouth 2 (two) times daily with a meal.   omega-3 acid ethyl esters 1 g capsule Commonly known as: LOVAZA TAKE 2 CAPSULES BY MOUTH 2 TIMES DAILY.   PARoxetine 30 MG tablet Commonly known as: PAXIL Take 60 mg by mouth daily.   sodium chloride HYPERTONIC 3 % nebulizer solution Take by nebulization in the morning and at bedtime.   [place] 4 % Crea Generic drug: Fluorouracil Apply 1 application topically every 3 (three) days. Dr tafeen       Review of systems negative except as noted in HPI / PMHx or noted below:  Review of Systems  Constitutional: Negative.   HENT: Negative.   Eyes: Negative.   Respiratory: Negative.   Cardiovascular: Negative.   Gastrointestinal: Negative.   Genitourinary: Negative.   Musculoskeletal: Negative.   Skin: Negative.   Neurological: Negative.   Endo/Heme/Allergies: Negative.   Psychiatric/Behavioral: Negative.     Family History  Problem Relation Age of Onset  . Depression Mother   . Schizophrenia Mother   .  Cancer Father        oral  . Heart disease Father        bradycardia  . Cancer Sister         skin and lung  . COPD Sister   . Hypertension Sister   . Osteoporosis Sister        compression fx's x 3 11/2018  . Hyperthyroidism Brother   . Cancer Brother        metastatic cancer to bone (?primary)  . Hyperlipidemia Daughter   . Asthma Son   . Cancer Paternal Grandmother 5       colon cancer, metastatic to liver  . Colon cancer Paternal Grandmother   . Diabetes Maternal Grandmother   . ADD / ADHD Other     Social History   Socioeconomic History  . Marital status: Divorced    Spouse name: Not on file  . Number of children: 2  . Years of education: Not on file  . Highest education level: Not on file  Occupational History  . Occupation: STAFF Optician, dispensing: [employer]  Tobacco Use  . Smoking status: Never Smoker  . Smokeless tobacco: Never Used  Vaping Use  . Vaping Use: Never used  Substance and Sexual Activity  . Alcohol use: Yes    Alcohol/week: 7.0 standard drinks    Types: 7 Glasses of wine per week    Comment: 1 glass of wine daily  . Drug use: No  . Sexual activity: Not Currently  Other Topics Concern  . 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  Objective:   Vitals:   08/22/19 1009  BP: 116/64  Pulse: 63  Resp: 16  SpO2: 96%   Height: 5' 4.5" (163.8 cm) Weight: 163 lb 6.4 oz (74.1 kg)  Physical Exam Constitutional:      Appearance: She is not diaphoretic.  HENT:     Head: Normocephalic.     Right Ear: Tympanic membrane, ear canal and external ear  normal.     Left Ear: Tympanic membrane, ear canal and external ear normal.     Nose: Nose normal. No mucosal edema or rhinorrhea.     Mouth/Throat:     Pharynx: Uvula midline. No oropharyngeal exudate.  Eyes:     Conjunctiva/sclera: Conjunctivae normal.  Neck:     Thyroid: No thyromegaly.     Trachea: Trachea normal. No tracheal tenderness or tracheal deviation.  Cardiovascular:     Rate and Rhythm: Normal rate and regular rhythm.     Heart sounds: Normal heart sounds, S1 normal and S2 normal. No murmur heard.   Pulmonary:     Effort: No respiratory distress.     Breath sounds: Normal breath sounds. No stridor. No wheezing or rales.  Lymphadenopathy:     Head:     Right side of head: No tonsillar adenopathy.     Left side of head: No tonsillar adenopathy.     Cervical: No cervical adenopathy.  Skin:  Findings: No erythema or rash.     Nails: There is no clubbing.  Neurological:     Mental Status: She is alert.     Diagnostics: Allergy skin tests were not performed.   Spirometry was performed and demonstrated an FEV1 of 2.19 @ 97 % of predicted. FEV1/FVC = 0.83  Results of blood tests obtained 14 Jul 2019 identifies IgG 465 mg/DL, IgM 28 mg/DL, IgA 86 mg/DL  Results of blood tests obtained 11 January 2019 identified WBC 6.1, absolute eosinophil 100, absolute lymphocyte 2600, hemoglobin 13.7, platelet 158  Results of AFB culture dated 21 July 2019 identifies positive AFB growth without organism identification to date  Results of a high-resolution chest CT scan obtained for May 2021 identified the following:  Cardiovascular: Heart size is normal. There is no significant pericardial fluid, thickening or pericardial calcification. There is aortic atherosclerosis, as well as atherosclerosis of the great vessels of the mediastinum and the coronary arteries, including calcified atherosclerotic plaque in the left main and left anterior descending coronary  arteries.  Mediastinum/Nodes: No pathologically enlarged mediastinal or hilar lymph nodes. Please note that accurate exclusion of hilar adenopathy is limited on noncontrast CT scans. Esophagus is unremarkable in appearance. No axillary lymphadenopathy.  Lungs/Pleura: High-resolution images demonstrates some patchy areas of ground-glass attenuation, peripheral predominant septal thickening and mild cylindrical bronchiectasis noted throughout the lungs bilaterally, most evident in the mid to lower lungs. No honeycombing. Inspiratory and expiratory imaging demonstrates moderate air trapping indicative of small airways disease. There is also extensive collapse of the trachea and mainstem bronchi.  Assessment and Plan:    1. Hypogammaglobulinemia ([place])   2. Bronchiectasis without complication ([place])   3. LPRD (laryngopharyngeal reflux disease)   4. Asthma, well controlled, mild persistent     1.  Blood - CBC w/D, IgA/G/M, anti-pneumo ab, Anti-tetanus ab, area 2 aeroallergen profile, Covid IgG  2. Treat reflex / LPR:   A. Minimize caffeine consumption  B. Nexium 40 mg in AM  C. Famotidine 40 mg in PM  3.  Immunoglobulin infusion replacement?  I think the question at hand concerning Tricha's situation revolves around her possible requirement for immunoglobulin replacement if she truly does show evidence of hypogammaglobulinemia with an inadequate antibody response to pathogens and immunogens.  She has been immunized with a Covid vaccine and she should have antibodies directed against Covid and if not it would certainly suggest that her B-cell immune system is inadequate in generating a good antibody response against proteins.  As well, we will check her antipneumococcal antibody titer looking at immune response to carbohydrate moieties.  She certainly has a history consistent with LPR and I have asked her to be a little bit more aggressive about treating this condition.  I will contact  her with the results of her blood test once they are available for review.  Jiles Prows, MD Allergy / Immunology [facility]

## 2019-08-22 NOTE — Patient Instructions (Addendum)
  1.  Blood - CBC w/D, IgA/G/M, anti-pneumo ab, Anti-tetanus ab, area 2 aeroallergen profile, Covid IgG  2. Treat reflex / LPR:   A. Minimize caffeine consumption  B. Nexium 40 mg in AM  C. Famotidine 40 mg in PM  3.  Immunoglobulin infusion replacement?

## 2019-08-23 ENCOUNTER — Encounter: Payer: Self-pay | Admitting: Allergy and Immunology

## 2019-08-23 NOTE — Addendum Note (Signed)
Addended by: Guy Franco on: 08/23/2019 04:53 PM   Modules accepted: Orders

## 2019-08-24 LAB — AFB CULTURE WITH SMEAR (NOT AT ARMC)
Acid Fast Culture: POSITIVE — AB
Acid Fast Smear: NEGATIVE

## 2019-08-24 LAB — AFB ID BY DNA PROBE
M avium complex: NEGATIVE
M gordonae: POSITIVE — AB
M kansasii: NEGATIVE
M tuberculosis complex: NEGATIVE

## 2019-08-28 LAB — ALLERGENS W/TOTAL IGE AREA 2
Alternaria Alternata IgE: <0.1 kU/L
Aspergillus Fumigatus IgE: <0.1 kU/L
Bermuda Grass IgE: <0.1 kU/L
Cat Dander IgE: <0.1 kU/L
Cedar, Mountain IgE: <0.1 kU/L
Cladosporium Herbarum IgE: <0.1 kU/L
Cockroach, German IgE: <0.1 kU/L
Common Silver Birch IgE: <0.1 kU/L
Cottonwood IgE: <0.1 kU/L
D Farinae IgE: 0.61 kU/L — AB
D Pteronyssinus IgE: 0.48 kU/L — AB
Dog Dander IgE: 0.12 kU/L — AB
Elm, American IgE: <0.1 kU/L
IgE (Immunoglobulin E), Serum: 50 IU/mL (ref 6–495)
Johnson Grass IgE: <0.1 kU/L
Maple/Box Elder IgE: <0.1 kU/L
Mouse Urine IgE: <0.1 kU/L
Oak, White IgE: <0.1 kU/L
Pecan, Hickory IgE: <0.1 kU/L
Penicillium Chrysogen IgE: <0.1 kU/L
Pigweed, Rough IgE: <0.1 kU/L
Ragweed, Short IgE: <0.1 kU/L
Sheep Sorrel IgE Qn: <0.1 kU/L
Timothy Grass IgE: <0.1 kU/L
White Mulberry IgE: <0.1 kU/L

## 2019-08-28 LAB — CBC WITH DIFFERENTIAL
Basophils Absolute: 0 10*3/uL (ref 0.0–0.2)
Basos: 1 %
EOS (ABSOLUTE): 0.1 10*3/uL (ref 0.0–0.4)
Eos: 3 %
Hematocrit: 40.3 % (ref 34.0–46.6)
Hemoglobin: 13.3 g/dL (ref 11.1–15.9)
Immature Grans (Abs): 0 10*3/uL (ref 0.0–0.1)
Immature Granulocytes: 1 %
Lymphocytes Absolute: 1.5 10*3/uL (ref 0.7–3.1)
Lymphs: 34 %
MCH: 32.6 pg (ref 26.6–33.0)
MCHC: 33 g/dL (ref 31.5–35.7)
MCV: 99 fL — ABNORMAL HIGH (ref 79–97)
Monocytes Absolute: 0.5 10*3/uL (ref 0.1–0.9)
Monocytes: 11 %
Neutrophils Absolute: 2.3 10*3/uL (ref 1.4–7.0)
Neutrophils: 50 %
RBC: 4.08 x10E6/uL (ref 3.77–5.28)
RDW: 14.2 % (ref 11.7–15.4)
WBC: 4.5 10*3/uL (ref 3.4–10.8)

## 2019-08-28 LAB — IGG, IGA, IGM
IgA/Immunoglobulin A, Serum: 60 mg/dL — ABNORMAL LOW (ref 64–422)
IgG (Immunoglobin G), Serum: 416 mg/dL — ABNORMAL LOW (ref 586–1602)
IgM (Immunoglobulin M), Srm: 24 mg/dL — ABNORMAL LOW (ref 26–217)

## 2019-08-28 LAB — SAR COV2 SEROLOGY (COVID19)AB(IGG),IA: DiaSorin SARS-CoV-2 Ab, IgG: POSITIVE

## 2019-08-28 LAB — STREP PNEUMONIAE 23 SEROTYPES IGG
Pneumo Ab Type 1*: 0.8 ug/mL — ABNORMAL LOW (ref >1.3)
Pneumo Ab Type 12 (12F)*: <0.1 ug/mL — ABNORMAL LOW (ref >1.3)
Pneumo Ab Type 14*: 5.8 ug/mL (ref >1.3)
Pneumo Ab Type 17 (17F)*: 0.8 ug/mL — ABNORMAL LOW (ref >1.3)
Pneumo Ab Type 19 (19F)*: 3.6 ug/mL (ref >1.3)
Pneumo Ab Type 2*: 1 ug/mL — ABNORMAL LOW (ref >1.3)
Pneumo Ab Type 20*: 2.2 ug/mL (ref >1.3)
Pneumo Ab Type 22 (22F)*: 0.8 ug/mL — ABNORMAL LOW (ref >1.3)
Pneumo Ab Type 23 (23F)*: 0.9 ug/mL — ABNORMAL LOW (ref >1.3)
Pneumo Ab Type 26 (6B)*: 0.8 ug/mL — ABNORMAL LOW (ref >1.3)
Pneumo Ab Type 3*: 2.9 ug/mL (ref >1.3)
Pneumo Ab Type 34 (10A)*: 0.9 ug/mL — ABNORMAL LOW (ref >1.3)
Pneumo Ab Type 4*: 1.4 ug/mL (ref >1.3)
Pneumo Ab Type 43 (11A)*: 0.7 ug/mL — ABNORMAL LOW (ref >1.3)
Pneumo Ab Type 5*: 1.5 ug/mL (ref >1.3)
Pneumo Ab Type 51 (7F)*: 2.6 ug/mL (ref >1.3)
Pneumo Ab Type 54 (15B)*: 0.9 ug/mL — ABNORMAL LOW (ref >1.3)
Pneumo Ab Type 56 (18C)*: 1.5 ug/mL (ref >1.3)
Pneumo Ab Type 57 (19A)*: 3.5 ug/mL (ref >1.3)
Pneumo Ab Type 68 (9V)*: 0.6 ug/mL — ABNORMAL LOW (ref >1.3)
Pneumo Ab Type 70 (33F)*: 1.5 ug/mL (ref >1.3)
Pneumo Ab Type 8*: 1 ug/mL — ABNORMAL LOW (ref >1.3)
Pneumo Ab Type 9 (9N)*: 2 ug/mL (ref >1.3)

## 2019-08-28 LAB — DIPHTHERIA / TETANUS ANTIBODY PANEL
Diphtheria Ab: 1.19 IU/mL (ref <0.10)
Tetanus Ab, IgG: 2 IU/mL (ref <0.10)

## 2019-09-03 DIAGNOSIS — J479 Bronchiectasis, uncomplicated: Secondary | ICD-10-CM | POA: Diagnosis not present

## 2019-09-03 DIAGNOSIS — G4733 Obstructive sleep apnea (adult) (pediatric): Secondary | ICD-10-CM | POA: Diagnosis not present

## 2019-09-03 DIAGNOSIS — J310 Chronic rhinitis: Secondary | ICD-10-CM | POA: Diagnosis not present

## 2019-09-04 ENCOUNTER — Encounter: Payer: Self-pay | Admitting: Allergy and Immunology

## 2019-09-04 ENCOUNTER — Other Ambulatory Visit: Payer: Self-pay

## 2019-09-04 DIAGNOSIS — J479 Bronchiectasis, uncomplicated: Secondary | ICD-10-CM

## 2019-09-04 DIAGNOSIS — D801 Nonfamilial hypogammaglobulinemia: Secondary | ICD-10-CM

## 2019-09-04 MED ORDER — FAMOTIDINE 40 MG PO TABS: 40.0000 mg | ORAL_TABLET | Freq: Every day | ORAL | 0 refills | Status: DC

## 2019-09-04 MED ORDER — ESOMEPRAZOLE MAGNESIUM 40 MG PO CPDR: DELAYED_RELEASE_CAPSULE | ORAL | 0 refills | Status: DC

## 2019-09-04 MED FILL — PNEUMOVAX 23 SYRINGE: 25 | 30 days supply | Qty: 1 | Fill #0

## 2019-09-07 ENCOUNTER — Encounter: Payer: Medicare PPO | Admitting: Internal Medicine

## 2019-09-07 DIAGNOSIS — M47816 Spondylosis without myelopathy or radiculopathy, lumbar region: Secondary | ICD-10-CM | POA: Diagnosis not present

## 2019-09-07 DIAGNOSIS — G894 Chronic pain syndrome: Secondary | ICD-10-CM | POA: Diagnosis not present

## 2019-09-07 DIAGNOSIS — Z79891 Long term (current) use of opiate analgesic: Secondary | ICD-10-CM | POA: Diagnosis not present

## 2019-09-07 DIAGNOSIS — M5416 Radiculopathy, lumbar region: Secondary | ICD-10-CM | POA: Diagnosis not present

## 2019-09-07 MED FILL — BELBUCA 150 MCG FILM: 150 | 30 days supply | Qty: 60 | Fill #0

## 2019-09-11 ENCOUNTER — Other Ambulatory Visit (HOSPITAL_COMMUNITY): Payer: Self-pay | Admitting: Anesthesiology

## 2019-09-11 MED FILL — PARoxetine HCL 40 MG TABS: 40 | 90 days supply | Qty: 135 | Fill #0

## 2019-09-19 ENCOUNTER — Ambulatory Visit: Payer: Medicare PPO | Admitting: Allergy and Immunology

## 2019-09-21 MED FILL — FAMOTIDINE 40 MG TABS: 40 | 90 days supply | Qty: 90 | Fill #0

## 2019-09-22 ENCOUNTER — Other Ambulatory Visit: Payer: Self-pay | Admitting: Family Medicine

## 2019-09-22 ENCOUNTER — Other Ambulatory Visit: Payer: Self-pay | Admitting: Internal Medicine

## 2019-09-22 NOTE — Telephone Encounter (Signed)
Okay to continue this medication? Last filled by Dr. Rita Ohara?

## 2019-09-25 ENCOUNTER — Other Ambulatory Visit (HOSPITAL_COMMUNITY): Payer: Self-pay | Admitting: Anesthesiology

## 2019-09-25 MED FILL — METHOCARBAMOL 500 MG TABS: 500 | 60 days supply | Qty: 180 | Fill #0

## 2019-09-29 LAB — AFB ID BY DNA PROBE
M avium complex: NEGATIVE
M gordonae: NEGATIVE
M kansasii: NEGATIVE
M tuberculosis complex: NEGATIVE

## 2019-09-29 LAB — AFB CULTURE WITH SMEAR (NOT AT ARMC)
Acid Fast Culture: POSITIVE — AB
Acid Fast Smear: NEGATIVE

## 2019-09-29 LAB — AFB IDENTIFICATION

## 2019-09-29 LAB — ORGANISM ID BY SEQUENCING

## 2019-10-02 ENCOUNTER — Other Ambulatory Visit: Payer: Self-pay | Admitting: Internal Medicine

## 2019-10-02 ENCOUNTER — Other Ambulatory Visit: Payer: Self-pay

## 2019-10-02 DIAGNOSIS — D801 Nonfamilial hypogammaglobulinemia: Secondary | ICD-10-CM | POA: Diagnosis not present

## 2019-10-02 DIAGNOSIS — J479 Bronchiectasis, uncomplicated: Secondary | ICD-10-CM | POA: Diagnosis not present

## 2019-10-03 MED FILL — OMEGA-3-ACID ETHYL ESTERS 1: 1 | 45 days supply | Qty: 180 | Fill #0

## 2019-10-04 DIAGNOSIS — J310 Chronic rhinitis: Secondary | ICD-10-CM | POA: Diagnosis not present

## 2019-10-04 DIAGNOSIS — G4733 Obstructive sleep apnea (adult) (pediatric): Secondary | ICD-10-CM | POA: Diagnosis not present

## 2019-10-04 DIAGNOSIS — J479 Bronchiectasis, uncomplicated: Secondary | ICD-10-CM | POA: Diagnosis not present

## 2019-10-10 LAB — STREP PNEUMONIAE 23 SEROTYPES IGG
Pneumo Ab Type 1*: 1.2 ug/mL — ABNORMAL LOW (ref >1.3)
Pneumo Ab Type 12 (12F)*: 0.1 ug/mL — ABNORMAL LOW (ref >1.3)
Pneumo Ab Type 14*: 6.2 ug/mL (ref >1.3)
Pneumo Ab Type 17 (17F)*: 1.4 ug/mL (ref >1.3)
Pneumo Ab Type 19 (19F)*: 2.9 ug/mL (ref >1.3)
Pneumo Ab Type 2*: 1.2 ug/mL — ABNORMAL LOW (ref >1.3)
Pneumo Ab Type 20*: 2.6 ug/mL (ref >1.3)
Pneumo Ab Type 22 (22F)*: 0.9 ug/mL — ABNORMAL LOW (ref >1.3)
Pneumo Ab Type 23 (23F)*: 1.1 ug/mL — ABNORMAL LOW (ref >1.3)
Pneumo Ab Type 26 (6B)*: 1 ug/mL — ABNORMAL LOW (ref >1.3)
Pneumo Ab Type 3*: 3.9 ug/mL (ref >1.3)
Pneumo Ab Type 34 (10A)*: 0.8 ug/mL — ABNORMAL LOW (ref >1.3)
Pneumo Ab Type 4*: 1.2 ug/mL — ABNORMAL LOW (ref >1.3)
Pneumo Ab Type 43 (11A)*: 0.7 ug/mL — ABNORMAL LOW (ref >1.3)
Pneumo Ab Type 5*: 2 ug/mL (ref >1.3)
Pneumo Ab Type 51 (7F)*: 2.7 ug/mL (ref >1.3)
Pneumo Ab Type 54 (15B)*: 0.9 ug/mL — ABNORMAL LOW (ref >1.3)
Pneumo Ab Type 56 (18C)*: 1.5 ug/mL (ref >1.3)
Pneumo Ab Type 57 (19A)*: 3 ug/mL (ref >1.3)
Pneumo Ab Type 68 (9V)*: 0.8 ug/mL — ABNORMAL LOW (ref >1.3)
Pneumo Ab Type 70 (33F)*: 1.1 ug/mL — ABNORMAL LOW (ref >1.3)
Pneumo Ab Type 8*: 1.2 ug/mL — ABNORMAL LOW (ref >1.3)
Pneumo Ab Type 9 (9N)*: 1.7 ug/mL (ref >1.3)

## 2019-10-12 DIAGNOSIS — M79642 Pain in left hand: Secondary | ICD-10-CM | POA: Insufficient documentation

## 2019-10-13 DIAGNOSIS — M79642 Pain in left hand: Secondary | ICD-10-CM | POA: Diagnosis not present

## 2019-10-16 ENCOUNTER — Other Ambulatory Visit (HOSPITAL_COMMUNITY): Payer: Self-pay | Admitting: Anesthesiology

## 2019-10-16 DIAGNOSIS — Z79891 Long term (current) use of opiate analgesic: Secondary | ICD-10-CM | POA: Diagnosis not present

## 2019-10-16 DIAGNOSIS — M5416 Radiculopathy, lumbar region: Secondary | ICD-10-CM | POA: Diagnosis not present

## 2019-10-16 DIAGNOSIS — G894 Chronic pain syndrome: Secondary | ICD-10-CM | POA: Diagnosis not present

## 2019-10-16 DIAGNOSIS — M47816 Spondylosis without myelopathy or radiculopathy, lumbar region: Secondary | ICD-10-CM | POA: Diagnosis not present

## 2019-10-16 MED FILL — BELBUCA 150 MCG FILM: 150 | 30 days supply | Qty: 60 | Fill #0

## 2019-10-18 ENCOUNTER — Encounter: Payer: Self-pay | Admitting: Internal Medicine

## 2019-10-20 MED FILL — ESOMEPRAZOLE MAG DR 40 MG C: 40 | 90 days supply | Qty: 90 | Fill #0

## 2019-10-24 NOTE — Progress Notes (Signed)
[facility]                                                             [address], [place], [place], [address]                                                                  Phn. [phone]; Fax: [phone]                                                                             Date: 10/25/2019  Reason for Referral: Nocardia Irving Copas in sputum cultures Referring Provider: Thersa Salt  Assessment Beverly [name] is a 74 year old female with a history of asthma, GERD, pneumonia and history of squamous cell carcinoma with   1. Pulmonary Nocardial Infection - Nocardial Nova, I have requested sensitivities to [facility] at [facility] today. The M gordonae from one  is lilkely a contaminant and  M haemophilum growing from one of the sputum sample is of questionable significance and would repeat AFB sputum cultures today to check for persistent growth. I will start her on Bactrim DS 2 tabs PO TID ( approx 15mg /kg) given her hypoimmunoglobulinemia. HIV ( 2015) Non reactive.   2. Hypogammoglobulinemia - She is already following an Immunologist Dr Neldon Mc and is under the process of getting Immunoglobulins once insurance is approved   Asthma - Following with Pulmonary Dr Shearon Stalls  GERD - On Nexium and Pepcid, PCP is following   Plan Orders Placed This Encounter  Procedures  . Acid Fast Smear+Cx/Rflx, Complete    Order Specific Question:   Release to patient    Answer:   Immediate  . MR BRAIN W CONTRAST    Order Specific Question:   If indicated for the ordered procedure, I authorize the administration of contrast media per Radiology protocol    Answer:   Yes    Order Specific Question:   What is the patient's sedation requirement?    Answer:   No Sedation    Order Specific Question:   Does the patient have a pacemaker or implanted devices?    Answer:   No    Order Specific  Question:   Radiology Contrast Protocol - do NOT remove file path    Answer:   [email or web address]    Order Specific Question:   Preferred imaging location?    Answer:   [facility] (table limit - 500 lbs)  . Basic metabolic panel    Standing Status:   Future    Standing Expiration Date:   10/24/2020    Order Specific Question:   Has the patient fasted?    Answer:   No   Meds ordered this encounter  Medications  . sulfamethoxazole-trimethoprim (BACTRIM DS) 800-160  MG tablet    Sig: Take 2 tablets by mouth in the morning, at noon, and at bedtime.    Dispense:  90 tablet    Refill:  5   Follow up with pulmonary next week Follow up with Immunology for Immuniglobulin therapy Follow up  with me in  4 weeks   Rosiland Oz, [facility]  Office phone [phone] Fax no. [phone] ______________________________________________________________________________________________________________________  HPI: Beverly [name] is a 74 year old female with a history of asthma, GERD, pneumonia and history of squamous cell carcinoma who is here for evaluation and management of Nocardia Irving Copas growing in her sputum cultures. she had productive cough for 3 years CT lungs right side February 2021 showed groundglass opacities. CT chest may 2021 showed spectrum of findings compatible with interstitial lung disease categorized as probable usual interstitial pneumonia however there is lack of overt honeycombing and moderate air trapping which is unusual for usual interstitial pneumonia the possibility of nonspecific interstitial pneumonia should be considered.  Severe tracheobronchomalacia. She was seen by Pulmonary Dr Shearon Stalls and had sputum cx done due to bronchiectasis in CT chest which grew Nocardia nova.    Patient says that she has been having productive cough for almost 3 years now. The color is variable from mucoid to  yellowish to greenish. The cough has gotten worse in the last 6 months. She also feels more fatigued. She denies any SOB during her usual day to day activities. She says she was on high dose flovent for 2 years before seeing Dr Shearon Stalls for management of Asthma. She denies being on prolonged steroid therapy. She had used short courses of prednisone 1-2 times in a year for Asthma eaxcerbation. Last use was 2 years ago. She denies any fevers, chills or night sweats but says she has sweating at least twice a day out of nowhere everyday. Denies any changes in appetite but has lost 10 lbs in the last 5 months. Denies any headache, blurry vision, weakness, numbness. Denies any nausea, vomiting, abdominal pain, abdominal distension and diarrhea. Denies any GU symptoms.Denies any neck pain. Has chronic back pain. She is ambulatory. Denies smoking, alcohol and using drugs. Denies allergy to sulpha antibiotics.   ROS: Constitutional: Negative for fever, chills, diaphoresis, activity change, appetite change, Fatigue + and weight loss + HENT: Negative for congestion, sore throat, rhinorrhea, sneezing, trouble swallowing and sinus pressure.  Eyes: Negative for photophobia and visual disturbance.  Respiratory: Negative for chest tightness, shortness of breath, wheezing and stridor.  Cardiovascular: Negative for chest pain, palpitations and leg swelling.  Gastrointestinal: Negative for nausea, vomiting, abdominal pain, diarrhea, constipation, blood in stool, abdominal distention and anal bleeding.  Genitourinary: positive for dysuria, hematuria, flank pain and difficulty urinating. Back pain + Musculoskeletal: Negative for myalgias, joint swelling, arthralgias and gait problem.  Skin: Negative for color change, pallor, rash and wound.  Neurological: Negative for dizziness, tremors, weakness and light-headedness.  Hematological: Negative for adenopathy. Does not bruise/bleed easily.  Psychiatric/Behavioral: Negative for  behavioral problems, confusion, sleep disturbance, dysphoric mood, decreased concentration and agitation.    Past Medical History:  Diagnosis Date  . Allergy   . Amaurosis fugax    negative w/u through [facility]  . Asthma   . Back pain    Dr Joline Salt 02/2010-epidural injection x 2 at L4-5 with good effect  . 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 BCC (basal cell carcinoma of skin) 05/09/2014   mid lower back  . BCC (basal  cell carcinoma of skin) 05/03/2017   right low back  . BCC (basal cell carcinoma of skin) 05/03/2017   left upper back  . BCC (basal cell carcinoma of skin) 07/05/2018   left mid back  . BCC (basal cell carcinoma of skin) 05/20/1992   upper back  . BCC (basal cell carcinoma of skin) 07/29/1993   left sholder medial  . BCC (basal cell carcinoma of skin) 07/29/1993   left sholder lateral  . BCC (basal cell carcinoma of skin) 07/29/1993   right thigh  . BCC (basal cell carcinoma of skin) 07/29/1993   right sholder  . BCC (basal cell carcinoma of skin) 12/22/1994   right mid forearm  . BCC (basal cell carcinoma of skin) 12/22/1994   right upper forearm  . BCC (basal cell carcinoma of skin) 12/22/1994   lower right upper arm  . BCC (basal cell carcinoma of skin) 12/22/1994   right upper arm sholder  . BCC (basal cell carcinoma of skin) 08/11/1995   left leg below knee  . BCC (basal cell carcinoma of skin) 04/11/2002   mid back  . BCC (basal cell carcinoma of skin) 12/10/2002   right center upper back  . BCC (basal cell carcinoma of skin) 05/26/2005   right post sholder  . BCC (basal cell carcinoma) 05/09/2014   left inner shin  . BCC (basal cell carcinoma) 06/12/2014   left forearm  . Bowen's disease 10/07/1994   right post knee, right inner forearm/wrist  . Bowen's disease 08/11/1995   left sholder  . Cataract    left  . Chronic pain   . Depression   . Diverticulosis of colon 1998   mild  . DJD (degenerative joint disease)   .  Duodenal ulcer    h/o  . GERD (gastroesophageal reflux disease)   . History of SCC (squamous cell carcinoma) of skin    Dr. Denna Haggard  . History of sinus bradycardia   . HLD (hyperlipidemia)    hypertriglyceridemia  . Hypertensive retinopathy of both eyes 11/18/2017  . IBS (irritable bowel syndrome)   . Internal hemorrhoids 1998  . Mitral regurgitation    mild  . Osteoarthritis    feet,shoulder,neck,back,hips and hands.  . Pneumonia   . PONV (postoperative nausea and vomiting)   . Rotator cuff tear, right 02/2019   infraspinatus and supraspinatus, and dislocation of long head of bicep tendon (Dr. Alvan Dame)  . SCC (squamous cell carcinoma) 11/26/2014   left hand, right hand, right deltoid  . SCC (squamous cell carcinoma) 05/03/2017   left cheek  . Sleep apnea    uses a mouth guard nightly  . Squamous cell carcinoma in situ (SCCIS) 07/05/2018   left hand  . Vitamin D deficiency    mild   Past Surgical History:  Procedure Laterality Date  . ABDOMINAL HYSTERECTOMY    . BLEPHAROPLASTY Bilateral 01/2018  . BROW LIFT Bilateral 10/10/2018   Procedure: BROW LIFT;  Surgeon: Wallace Going, DO;  Location: [facility];  Service: Plastics;  Laterality: Bilateral;  . BUNIONECTOMY     R 12/08, L 2004 (Dr. Janus Molder)  . CARPAL TUNNEL RELEASE  1989   bilateral  . CATARACT EXTRACTION Bilateral Left in 08/2012, Right 04/2016   Dr.Hecker  . CESAREAN SECTION     x2  . CHOLECYSTECTOMY  1992  . CLOSED REDUCTION NASAL FRACTURE N/A 08/29/2018   Procedure: CLOSED REDUCTION NASAL FRACTURE;  Surgeon: Wallace Going, DO;  Location: [facility];  Service: Clinical cytogeneticist;  Laterality: N/A;  1 hour, please  . COLONOSCOPY  2006  . COLONOSCOPY  01/2012   due again 01/2022; mild diverticulosis  . epidural steroid injection, back  02/2010  . HIP SURGERY     right bursectomy x 3  . HIP SURGERY Right 2000   torn cartilage, repaired  . INGUINAL HERNIA REPAIR  9/09   bilat  .  NECK SURGERY  1989   c6-7 cervical laminectomy and diskecotmy  . NECK SURGERY  1989   per patient posterior area of neck  . REVERSE SHOULDER ARTHROPLASTY Right 04/11/2019   Procedure: REVERSE SHOULDER ARTHROPLASTY;  Surgeon: Nicholes Stairs, MD;  Location: [facility];  Service: Orthopedics;  Laterality: Right;  [facility]  . SHOULDER SURGERY  2/05   left rotator cuff repair  . SHOULDER SURGERY Left 03/14/2018   rotator cuff repair; Dr. Tonita Cong  . TONSILLECTOMY  age 32  . TOTAL ABDOMINAL HYSTERECTOMY W/ BILATERAL SALPINGOOPHORECTOMY  1997   fibroids  . TROCHANTERIC BURSA EXCISION Right 1978  . TUBAL LIGATION  1986  . WISDOM TOOTH EXTRACTION     Current Outpatient Medications on File Prior to Visit  Medication Sig Dispense Refill  . acetaminophen (TYLENOL) 500 MG tablet Take 1,000 mg by mouth every 6 (six) hours as needed for moderate pain.     Marland Kitchen albuterol (PROVENTIL HFA;VENTOLIN HFA) 108 (90 Base) MCG/ACT inhaler Inhale 2 puffs into the lungs every 6 (six) hours as needed for wheezing or shortness of breath. 18 g 1  . amoxicillin (AMOXIL) 500 MG tablet Take 2,000 mg by mouth as directed. For dental appointments    . aspirin EC 81 MG tablet Take 81 mg by mouth daily.    Marland Kitchen atorvastatin (LIPITOR) 40 MG tablet Take 1 tablet (40 mg total) by mouth daily. 90 tablet 1  . BELBUCA 150 MCG FILM     . Calcium Carbonate-Vitamin D (CALCIUM 600+D) 600-200 MG-UNIT TABS Take 1 tablet by mouth 2 (two) times daily.      . celecoxib (CELEBREX) 200 MG capsule Take 1 capsule (200 mg total) by mouth 2 (two) times daily. 180 capsule 1  . diclofenac Sodium (VOLTAREN) 1 % GEL Apply 2 g topically 4 (four) times daily as needed (joint pain).     Marland Kitchen esomeprazole (NEXIUM) 40 MG capsule TAKE 1 CAPSULE BY MOUTH DAILY IN THE MORNING 90 capsule 0  . famotidine (PEPCID) 40 MG tablet Take 1 tablet (40 mg total) by mouth daily. 90 tablet 0  . Fluorouracil (TOLAK) 4 % CREA Apply 1 application topically every 3 (three)  days. Dr Denna Haggard    . gabapentin (NEURONTIN) 800 MG tablet Take 800 mg by mouth at bedtime.     . GUAIFENESIN ER PO Take 400 mg by mouth 2 (two) times daily.     Marland Kitchen HYDROcodone-acetaminophen (NORCO) 7.5-325 MG tablet Take 1 tablet by mouth every 12 (twelve) hours as needed for moderate pain. Dr Hardin Negus    . loperamide (IMODIUM A-D) 2 MG tablet Take 2 mg by mouth 4 (four) times daily as needed for diarrhea or loose stools.    . methocarbamol (ROBAXIN) 500 MG tablet Take 500 mg by mouth in the morning and at bedtime.     . montelukast (SINGULAIR) 10 MG tablet Take 1 tablet (10 mg total) by mouth at bedtime. 90 tablet 1  . Multiple Vitamins-Minerals (MULTIVITAMIN WITH MINERALS) tablet Take 1 tablet by mouth daily.    . niacinamide 500 MG tablet Take 500  mg by mouth 2 (two) times daily with a meal.    . omega-3 acid ethyl esters (LOVAZA) 1 g capsule TAKE 2 CAPSULES BY MOUTH TWICE DAILY 180 capsule 0  . PARoxetine (PAXIL) 30 MG tablet Take 60 mg by mouth daily.     Marland Kitchen Respiratory Therapy Supplies (FLUTTER) DEVI 1 puff by Does not apply route 2 (two) times daily. 1 each 0  . sodium chloride HYPERTONIC 3 % nebulizer solution Take by nebulization in the morning and at bedtime. 750 mL 12  . fentaNYL (DURAGESIC - DOSED MCG/HR) 25 MCG/HR Place 1 patch onto the skin every 3 (three) days. Dr Hardin Negus     No current facility-administered medications on file prior to visit.   Allergies  Allergen Reactions  . Adhesive [Tape] Rash  . Codeine Rash   Social History   Socioeconomic History  . Marital status: Divorced    Spouse name: Not on file  . Number of children: 2  . Years of education: Not on file  . Highest education level: Not on file  Occupational History  . Occupation: STAFF Optician, dispensing: [employer]  Tobacco Use  . Smoking status: Never Smoker  . Smokeless tobacco: Never Used  Vaping Use  . Vaping Use: Never used  Substance and Sexual Activity  . Alcohol use: Yes     Alcohol/week: 7.0 standard drinks    Types: 7 Glasses of wine per week    Comment: 1 glass of wine daily  . Drug use: No  . Sexual activity: Not Currently  Other Topics Concern  . 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   [place] for [employer] part-time   Social Determinants of Health   Financial Resource Strain:   . Difficulty of Paying Living Expenses: Not on file  Food Insecurity:   . Worried About Charity fundraiser in the Last Year: Not on file  . Ran Out of Food in the Last Year: Not on file  Transportation Needs:   . Lack of Transportation (Medical): Not on file  . Lack of Transportation (Non-Medical): Not on file  Physical Activity:   . Days of Exercise per Week: Not on file  . Minutes of Exercise per Session: Not on file  Stress:   . Feeling of Stress : Not on file  Social Connections:   . Frequency of Communication with Friends and Family: Not on file  . Frequency of Social Gatherings with Friends and Family: Not on file  . Attends Religious Services: Not on file  . Active Member of Clubs or Organizations: Not on file  . Attends Archivist Meetings: Not on file  . Marital Status: Not on file  Intimate Partner Violence:   . Fear of Current or Ex-Partner: Not on file  . Emotionally Abused: Not on file  . Physically Abused: Not on file  . Sexually Abused: Not on file     Vitals BP 123/75   Pulse 62   Temp 98.2 F (36.8 C) (Oral)   Ht 5\' 5"  (1.651 m)   Wt 153 lb (69.4 kg)   SpO2 99%   BMI 25.46 kg/m    Examination  General - not in acute distress, comfortably sitting in chair HEENT - PEERLA, no pallor and no icterus Chest -  b/l clear air entry, no additional sounds CVS- Normal W1X9, systolic mur Abdomen - Soft, Non tender , non distended Ext- no  pedal edema Neuro: grossly normal Back - WNL Psych : calm and cooperative Ski: no obvious rashes, lesions    Recent labs CBC Latest Ref Rng & Units 08/22/2019 06/22/2019 04/12/2019  WBC 3.4 - 10.8 x10E3/uL 4.5 10.0 -  Hemoglobin 11.1 - 15.9 g/dL 13.3 14.1 11.5(L)  Hematocrit 34.0 - 46.6 % 40.3 44.8 35.0(L)  Platelets 150 - 400 K/uL - 208 -   CMP Latest Ref Rng & Units 06/22/2019 04/12/2019 04/07/2019  Glucose 70 - 99 mg/dL 94 130(H) 141(H)  BUN 8 - 23 mg/dL 15 8 11   Creatinine 0.44 - 1.00 mg/dL 0.69 0.56 0.67  Sodium 135 - 145 mmol/L 142 141 140  Potassium 3.5 - 5.1 mmol/L 3.9 4.1 3.8  Chloride 98 - 111 mmol/L 106 107 106  CO2 22 - 32 mmol/L 28 24 25   Calcium 8.9 - 10.3 mg/dL 9.7 8.9 9.3  Total Protein 6.0 - 8.5 g/dL - - -  Total Bilirubin 0.0 - 1.2 mg/dL - - -  Alkaline Phos 39 - 117 IU/L - - -  AST 0 - 40 IU/L - - -  ALT 0 - 32 IU/L - - -     Pertinent Microbiology 07/14/19 sputum  AFB cx M gordonae ( 1 sputum sample)  07/21/19  Sputum AFB cx  M ycobacterium mucogenicum/phocaicum ( 1 sputum sample) 07/21/19  Sputum AFB cx Nocardia nova, sensitivities are pending    Pertinent Imaging FINDINGS: Cardiovascular: Heart size is normal. There is no significant pericardial fluid, thickening or pericardial calcification. There is aortic atherosclerosis, as well as atherosclerosis of the great vessels of the mediastinum and the coronary arteries, including calcified atherosclerotic plaque in the left main and left anterior descending coronary arteries.  Mediastinum/Nodes: No pathologically enlarged mediastinal or hilar lymph nodes. Please note that accurate exclusion of hilar adenopathy is limited on noncontrast CT scans. Esophagus is unremarkable in appearance. No axillary lymphadenopathy.  Lungs/Pleura: High-resolution images demonstrates some patchy areas of ground-glass attenuation, peripheral predominant septal thickening and mild cylindrical bronchiectasis noted throughout  the lungs bilaterally, most evident in the mid to lower lungs. No honeycombing. Inspiratory and expiratory imaging demonstrates moderate air trapping indicative of small airways disease. There is also extensive collapse of the trachea and mainstem bronchi.  Upper Abdomen: Aortic atherosclerosis.  Musculoskeletal: There are no aggressive appearing lytic or blastic lesions noted in the visualized portions of the skeleton.  IMPRESSION: 1. There is a spectrum of findings compatible with interstitial lung disease, categorized as probable usual interstitial pneumonia (UIP) per current ATS guidelines. However, there is lack of overt honeycombing, and moderate air trapping which is unusual for usual interstitial pneumonia. The possibility of nonspecific interstitial pneumonia (NSIP) should be considered. 2. Severe tracheobronchomalacia. 3. Aortic atherosclerosis, in addition to left main and left anterior descending coronary artery disease. Assessment for potential risk factor modification, dietary therapy or pharmacologic therapy may be warranted, if clinically indicated.  Aortic Atherosclerosis (ICD10-I70.0).   All pertinent labs/Imagings/notes reviewed. All pertinent plain films and CT images have been personally visualized and interpreted; radiology reports have been reviewed. Decision making incorporated into the Impression / Recommendations.

## 2019-10-25 ENCOUNTER — Encounter: Payer: Self-pay | Admitting: Infectious Diseases

## 2019-10-25 ENCOUNTER — Ambulatory Visit: Payer: Medicare PPO | Admitting: Infectious Diseases

## 2019-10-25 ENCOUNTER — Other Ambulatory Visit: Payer: Self-pay | Admitting: Infectious Diseases

## 2019-10-25 ENCOUNTER — Other Ambulatory Visit: Payer: Self-pay

## 2019-10-25 VITALS — BP 123/75 | HR 62 | Temp 98.2°F | Ht 65.0 in | Wt 153.0 lb

## 2019-10-25 DIAGNOSIS — A439 Nocardiosis, unspecified: Secondary | ICD-10-CM

## 2019-10-25 MED ORDER — SULFAMETHOXAZOLE-TRIMETHOPRIM 800-160 MG PO TABS: 2.0000 | ORAL_TABLET | Freq: Three times a day (TID) | ORAL | 5 refills | Status: DC

## 2019-10-25 MED FILL — SULFAMETHOXAZOLE-TMP DS TAB: 800-160 | 15 days supply | Qty: 90 | Fill #0

## 2019-10-26 ENCOUNTER — Other Ambulatory Visit: Payer: Self-pay | Admitting: Internal Medicine

## 2019-10-26 DIAGNOSIS — M159 Polyosteoarthritis, unspecified: Secondary | ICD-10-CM

## 2019-10-27 ENCOUNTER — Other Ambulatory Visit: Payer: Self-pay | Admitting: Internal Medicine

## 2019-10-27 MED FILL — CELECOXIB 200 MG CAP: 200 | 90 days supply | Qty: 180 | Fill #0

## 2019-11-01 ENCOUNTER — Other Ambulatory Visit: Payer: Self-pay

## 2019-11-01 ENCOUNTER — Encounter: Payer: Self-pay | Admitting: Internal Medicine

## 2019-11-01 ENCOUNTER — Other Ambulatory Visit (INDEPENDENT_AMBULATORY_CARE_PROVIDER_SITE_OTHER): Payer: Medicare PPO | Admitting: Infectious Diseases

## 2019-11-01 ENCOUNTER — Telehealth: Payer: Self-pay

## 2019-11-01 ENCOUNTER — Ambulatory Visit: Payer: Medicare PPO | Admitting: Internal Medicine

## 2019-11-01 VITALS — BP 118/68 | HR 72 | Temp 97.2°F | Wt 145.0 lb

## 2019-11-01 DIAGNOSIS — A430 Pulmonary nocardiosis: Secondary | ICD-10-CM | POA: Diagnosis not present

## 2019-11-01 DIAGNOSIS — J470 Bronchiectasis with acute lower respiratory infection: Secondary | ICD-10-CM

## 2019-11-01 DIAGNOSIS — D801 Nonfamilial hypogammaglobulinemia: Secondary | ICD-10-CM

## 2019-11-01 DIAGNOSIS — R1111 Vomiting without nausea: Secondary | ICD-10-CM

## 2019-11-01 MED ORDER — ONDANSETRON HCL 4 MG PO TABS: 4.0000 mg | ORAL_TABLET | Freq: Three times a day (TID) | ORAL | 0 refills | Status: DC | PRN

## 2019-11-01 MED FILL — ONDANSETRON HCL 4 MG TABS: 4 | 7 days supply | Qty: 20 | Fill #0

## 2019-11-01 NOTE — Progress Notes (Signed)
Beverly [name]    [id number]    15-Apr-1945  Primary Care Physician:Hernandez Everardo Beals, MD Date of Appointment: 11/01/2019 Established Patient Visit  Chief complaint:   Chief Complaint  Patient presents with  . Follow-up    3 month follow up--will start sq infusions today     HPI: Beverly [name] is a 74 y.o. woman with bronchiectasis a hypogammaglobulinemia who presents for follow up.  Interval Updates: Sputum cultures positive for nocardia. Has seen ID and started treatment. Also saw Dr. Neldon Mc and is staring IVIG infusions today. Plan is for brain MRI for nocardia. Has been keeping up with airway clearance. No fevers chills night sweats or hemoptysis.   I have reviewed the patient's family social and past medical history and updated as appropriate.   Past Medical History:  Diagnosis Date  . Allergy   . Amaurosis fugax    negative w/u through [facility]  . Asthma   . Back pain    Dr Joline Salt 02/2010-epidural injection x 2 at L4-5 with good effect  . 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 BCC (basal cell carcinoma of skin) 05/09/2014   mid lower back  . BCC (basal cell carcinoma of skin) 05/03/2017   right low back  . BCC (basal cell carcinoma of skin) 05/03/2017   left upper back  . BCC (basal cell carcinoma of skin) 07/05/2018   left mid back  . BCC (basal cell carcinoma of skin) 05/20/1992   upper back  . BCC (basal cell carcinoma of skin) 07/29/1993   left sholder medial  . BCC (basal cell carcinoma of skin) 07/29/1993   left sholder lateral  . BCC (basal cell carcinoma of skin) 07/29/1993   right thigh  . BCC (basal cell carcinoma of skin) 07/29/1993   right sholder  . BCC (basal cell carcinoma of skin) 12/22/1994   right mid forearm  . BCC (basal cell carcinoma of skin) 12/22/1994   right upper forearm  . BCC (basal cell carcinoma of skin) 12/22/1994   lower right upper arm  . BCC (basal cell carcinoma of skin)  12/22/1994   right upper arm sholder  . BCC (basal cell carcinoma of skin) 08/11/1995   left leg below knee  . BCC (basal cell carcinoma of skin) 04/11/2002   mid back  . BCC (basal cell carcinoma of skin) 12/10/2002   right center upper back  . BCC (basal cell carcinoma of skin) 05/26/2005   right post sholder  . BCC (basal cell carcinoma) 05/09/2014   left inner shin  . BCC (basal cell carcinoma) 06/12/2014   left forearm  . Bowen's disease 10/07/1994   right post knee, right inner forearm/wrist  . Bowen's disease 08/11/1995   left sholder  . Cataract    left  . Chronic pain   . Depression   . Diverticulosis of colon 1998   mild  . DJD (degenerative joint disease)   . Duodenal ulcer    h/o  . GERD (gastroesophageal reflux disease)   . History of SCC (squamous cell carcinoma) of skin    Dr. Denna Haggard  . History of sinus bradycardia   . HLD (hyperlipidemia)    hypertriglyceridemia  . Hypertensive retinopathy of both eyes 11/18/2017  . IBS (irritable bowel syndrome)   . Internal hemorrhoids 1998  . Mitral regurgitation    mild  . Osteoarthritis    feet,shoulder,neck,back,hips and hands.  . Pneumonia   .  PONV (postoperative nausea and vomiting)   . Rotator cuff tear, right 02/2019   infraspinatus and supraspinatus, and dislocation of long head of bicep tendon (Dr. Alvan Dame)  . SCC (squamous cell carcinoma) 11/26/2014   left hand, right hand, right deltoid  . SCC (squamous cell carcinoma) 05/03/2017   left cheek  . Sleep apnea    uses a mouth guard nightly  . Squamous cell carcinoma in situ (SCCIS) 07/05/2018   left hand  . Vitamin D deficiency    mild    Past Surgical History:  Procedure Laterality Date  . ABDOMINAL HYSTERECTOMY    . BLEPHAROPLASTY Bilateral 01/2018  . BROW LIFT Bilateral 10/10/2018   Procedure: BROW LIFT;  Surgeon: Wallace Going, DO;  Location: [facility];  Service: Plastics;  Laterality: Bilateral;  . BUNIONECTOMY     R  12/08, L 2004 (Dr. Janus Molder)  . CARPAL TUNNEL RELEASE  1989   bilateral  . CATARACT EXTRACTION Bilateral Left in 08/2012, Right 04/2016   Dr.Hecker  . CESAREAN SECTION     x2  . CHOLECYSTECTOMY  1992  . CLOSED REDUCTION NASAL FRACTURE N/A 08/29/2018   Procedure: CLOSED REDUCTION NASAL FRACTURE;  Surgeon: Wallace Going, DO;  Location: [facility];  Service: Plastics;  Laterality: N/A;  1 hour, please  . COLONOSCOPY  2006  . COLONOSCOPY  01/2012   due again 01/2022; mild diverticulosis  . epidural steroid injection, back  02/2010  . HIP SURGERY     right bursectomy x 3  . HIP SURGERY Right 2000   torn cartilage, repaired  . INGUINAL HERNIA REPAIR  9/09   bilat  . NECK SURGERY  1989   c6-7 cervical laminectomy and diskecotmy  . NECK SURGERY  1989   per patient posterior area of neck  . REVERSE SHOULDER ARTHROPLASTY Right 04/11/2019   Procedure: REVERSE SHOULDER ARTHROPLASTY;  Surgeon: Nicholes Stairs, MD;  Location: [facility];  Service: Orthopedics;  Laterality: Right;  Regional Block  . SHOULDER SURGERY  2/05   left rotator cuff repair  . SHOULDER SURGERY Left 03/14/2018   rotator cuff repair; Dr. Tonita Cong  . TONSILLECTOMY  age 75  . TOTAL ABDOMINAL HYSTERECTOMY W/ BILATERAL SALPINGOOPHORECTOMY  1997   fibroids  . TROCHANTERIC BURSA EXCISION Right 1978  . TUBAL LIGATION  1986  . WISDOM TOOTH EXTRACTION      Family History  Problem Relation Age of Onset  . Depression Mother   . Schizophrenia Mother   . Cancer Father        oral  . Heart disease Father        bradycardia  . Cancer Sister        skin and lung  . COPD Sister   . Hypertension Sister   . Osteoporosis Sister        compression fx's x 3 11/2018  . Hyperthyroidism Brother   . Cancer Brother        metastatic cancer to bone (?primary)  . Hyperlipidemia Daughter   . Asthma Son   . Cancer Paternal Grandmother 76       colon cancer, metastatic to liver  . Colon cancer Paternal Grandmother    . Diabetes Maternal Grandmother   . ADD / ADHD Other     Social History   Occupational History  . Occupation: STAFF Optician, dispensing: [employer]  Tobacco Use  . Smoking status: Never Smoker  . Smokeless tobacco: Never Used  Vaping Use  . Vaping Use: Never used  Substance and Sexual Activity  . Alcohol use: Yes    Alcohol/week: 7.0 standard drinks    Types: 7 Glasses of wine per week    Comment: 1 glass of wine daily  . Drug use: No  . Sexual activity: Not Currently     Physical Exam: Blood pressure 118/68, pulse 72, temperature (!) 97.2 F (36.2 C), temperature source Oral, weight 145 lb (65.8 kg), SpO2 98 %.  Gen:      No acute distress Lungs:    No increased respiratory effort, symmetric chest wall excursion, clear to auscultation bilaterally, no wheezes or crackles CV:         Regular rate and rhythm; no murmurs, rubs, or gallops.  No pedal edema  Data Reviewed: Imaging: I have personally reviewed the CT Chest May 2021 which shows central and lower lobe bronchiectasis. TBM on expiratory cuts.   PFTs: Unable to view spirometry from April 2019 - reportedly normal.   Sleep study June 2021 IMPRESSIONS - No significant obstructive sleep apnea occurred during this study (AHI = 1.9/h). - No significant central sleep apnea occurred during this study (CAI = 0.7/h). - The patient had minimal or no oxygen desaturation during the study (Min O2 = 89.0%) - The patient snored with soft snoring volume.  Labs: Lab Results  Component Value Date   WBC 4.5 08/22/2019   HGB 13.3 08/22/2019   HCT 40.3 08/22/2019   MCV 99 (H) 08/22/2019   PLT 208 06/22/2019   Lab Results  Component Value Date   NA 142 06/22/2019   K 3.9 06/22/2019   CL 106 06/22/2019   CO2 28 06/22/2019     Immunization status: Immunization History  Administered Date(s) Administered  . Hepatitis A 06/17/2006, 01/05/2007  . IPV 04/15/2012  . Influenza Split 10/31/2013  . Influenza,  High Dose Seasonal PF 11/16/2016, 11/16/2017, 11/01/2018  . Influenza,inj,quad, With Preservative 11/16/2017, 12/12/2018  . Influenza-Unspecified 11/09/2014, 11/14/2015  . PFIZER SARS-COV-2 Vaccination 02/05/2019, 02/25/2019  . Pneumococcal Conjugate-13 07/20/2013  . Pneumococcal Polysaccharide-23 06/17/2006, 11/26/2014, 09/05/2019  . Tdap 09/16/2004, 10/08/2014  . Typhoid Live 06/17/2011, 06/10/2017  . Unspecified SARS-COV-2 Vaccination 02/20/2019  . Yellow Fever 07/08/2012  . Zoster 04/04/2009  . Zoster Recombinat (Shingrix) 04/28/2016, 08/15/2016    Assessment:  Bronchiectasis with Nocardiosis (mild Possible Mild Persistent asthma OSA with excessive daytime sleepiness.  Chronic cough with LPR Tracheobronchomalacia (TBM)  Plan/Recommendations:  Start using wedge pillow in addition to the PPI H2 blocker for LPR.  Ideally in bronchiectasis (as well as TBM) we like to avoid ICS as this worsens the risk for pneumonia.  Repeat sputum culture after bactrim for 2 weeks per ID.  Agree with brain MRI and IVIG per ID and A/I team Continue airway clearance with nebulized saline and flutter valve.  Continue appliance for OSA. Split night study without worsening   Return to Care: Return in about 4 months (around 03/02/2020).  Lenice Llamas, MD Pulmonary and [place]

## 2019-11-01 NOTE — Telephone Encounter (Signed)
Patient left voicemail yesterday stating she has been having nausea/vomitting since last Saturday. Is taking Zofran for nausea, but states she still has episodes of vomiting 3-4 hours after eating.  Patient states she believes she had food poising since last Saturday. Is not sure if vomiting is related to her infection or food poisoning. Would like to inform MD she will be receiving her first home infusion today.Recived equipment for IVIG infusion yesterday.  Would like call back regarding concerns.  [place]

## 2019-11-01 NOTE — Telephone Encounter (Signed)
Sure, I will call her. Thanks.

## 2019-11-01 NOTE — Progress Notes (Signed)
I called Beverly [name]. She says she started vomiting since last Saturday. She says she had a breakfast at a restaurant at 10 am that morning and started to vomit since 2 pm. She had some salad and shrimp/lobsters. She denies any symptoms with other people having the breakfast with her but also says that she was the only one who had shrimp and lobster. She has been taking zofran for vomiting. She denies any fever, chills, abdominal pain and diarrhea. She has been taking Bactrim since she was last seen in the clinic and did not have any issues until this past Saturday. I prescribed her some more zofran and recommended to continue hydration. I also explained her if she starts having fever, vomiting is persistent and unrelieved by zofran, she will need to go to the Urgent Care.

## 2019-11-01 NOTE — Patient Instructions (Signed)
The patient should have follow up scheduled with myself in 4 months.    What is GERD? Gastroesophageal reflux disease (GERD) is gastroesophageal reflux diseasewhich occurs when the lower esophageal sphincter (LES) opens spontaneously, for varying periods of time, or does not close properly and stomach contents rise up into the esophagus. GER is also called acid reflux or acid regurgitation, because digestive juices--called acids--rise up with the food. The esophagus is the tube that carries food from the mouth to the stomach. The LES is a ring of muscle at the bottom of the esophagus that acts like a valve between the esophagus and stomach.  When acid reflux occurs, food or fluid can be tasted in the back of the mouth. When refluxed stomach acid touches the lining of the esophagus it may cause a burning sensation in the chest or throat called heartburn or acid indigestion. Occasional reflux is common. Persistent reflux that occurs more than twice a week is considered GERD, and it can eventually lead to more serious health problems. People of all ages can have GERD. Studies have shown that GERD may worsen or contribute to asthma, chronic cough, and pulmonary fibrosis.   What are the symptoms of GERD? The main symptom of GERD in adults is frequent heartburn, also called acid indigestion--burning-type pain in the lower part of the mid-chest, behind the breast bone, and in the mid-abdomen.  Not all reflux is acidic in nature, and many patients don't have heart burn at all. Sometimes it feels like a cough (either dry or with mucus), choking sensation, asthma, shortness of breath, waking up at night, frequent throat clearing, or trouble swallowing.    What causes GERD? The reason some people develop GERD is still unclear. However, research shows that in people with GERD, the LES relaxes while the rest of the esophagus is working. Anatomical abnormalities such as a hiatal hernia may also contribute to GERD. A  hiatal hernia occurs when the upper part of the stomach and the LES move above the diaphragm, the muscle wall that separates the stomach from the chest. Normally, the diaphragm helps the LES keep acid from rising up into the esophagus. When a hiatal hernia is present, acid reflux can occur more easily. A hiatal hernia can occur in people of any age and is most often a normal finding in otherwise healthy people over age 7. Most of the time, a hiatal hernia produces no symptoms.   Other factors that may contribute to GERD include - Obesity or recent weight gain - Pregnancy  - Smoking  - Diet - Certain medications  Common foods that can worsen reflux symptoms include: - carbonated beverages - artificial sweeteners - citrus fruits  - chocolate  - drinks with caffeine or alcohol  - fatty and fried foods  - garlic and onions  - mint flavorings  - spicy foods  - tomato-based foods, like spaghetti sauce, salsa, chili, and pizza   Lifestyle Changes If you smoke, stop.  Avoid foods and beverages that worsen symptoms (see above.) Lose weight if needed.  Eat small, frequent meals.  Wear loose-fitting clothes.  Avoid lying down for 3 hours after a meal.  Raise the head of your bed 6 to 8 inches by securing wood blocks under the bedposts. Just using extra pillows will not help, but using a wedge-shaped pillow may be helpful.  Medications  H2 blockers, such as cimetidine (Tagamet HB), famotidine (Pepcid AC), nizatidine (Axid AR), and ranitidine (Zantac 75), decrease acid production. They are  available in prescription strength and over-the-counter strength. These drugs provide short-term relief and are effective for about half of those who have GERD symptoms.  Proton pump inhibitors include omeprazole (Prilosec, Zegerid), lansoprazole (Prevacid), pantoprazole (Protonix), rabeprazole (Aciphex), and esomeprazole (Nexium), which are available by prescription. Prilosec is also available in  over-the-counter strength. Proton pump inhibitors are more effective than H2 blockers and can relieve symptoms and heal the esophageal lining in almost everyone who has GERD.  Because drugs work in different ways, combinations of medications may help control symptoms. People who get heartburn after eating may take both antacids and H2 blockers. The antacids work first to neutralize the acid in the stomach, and then the H2 blockers act on acid production. By the time the antacid stops working, the H2 blocker will have stopped acid production. Your health care provider is the best source of information about how to use medications for GERD.   Points to Remember 1. You can have GERD without having heartburn. Your symptoms could include a dry cough, asthma symptoms, or trouble swallowing.  2. Taking medications daily as prescribed is important in controlling you symptoms.  Sometimes it can take up to 8 weeks to fully achieve the effects of the medications prescribed.  3. Coughing related to GERD can be difficult to treat and is very frustrating!  However, it is important to stick with these medications and lifestyle modifications before pursuing more aggressive or invasive test and treatments.

## 2019-11-04 DIAGNOSIS — J310 Chronic rhinitis: Secondary | ICD-10-CM | POA: Diagnosis not present

## 2019-11-04 DIAGNOSIS — J479 Bronchiectasis, uncomplicated: Secondary | ICD-10-CM | POA: Diagnosis not present

## 2019-11-04 DIAGNOSIS — G4733 Obstructive sleep apnea (adult) (pediatric): Secondary | ICD-10-CM | POA: Diagnosis not present

## 2019-11-06 ENCOUNTER — Other Ambulatory Visit: Payer: Self-pay

## 2019-11-06 ENCOUNTER — Ambulatory Visit (HOSPITAL_COMMUNITY): Payer: Medicare PPO

## 2019-11-06 ENCOUNTER — Ambulatory Visit (HOSPITAL_COMMUNITY)
Admission: RE | Admit: 2019-11-06 | Discharge: 2019-11-06 | Disposition: A | Discharge status: Home / Self Care | Payer: Medicare PPO | Source: Ambulatory Visit | Attending: Infectious Diseases | Admitting: Infectious Diseases

## 2019-11-06 DIAGNOSIS — A439 Nocardiosis, unspecified: Secondary | ICD-10-CM | POA: Insufficient documentation

## 2019-11-06 DIAGNOSIS — A419 Sepsis, unspecified organism: Secondary | ICD-10-CM | POA: Diagnosis not present

## 2019-11-06 LAB — NOCARDIA SUSCEPTIBILITY BROTH (NOCSBL)
Ceftriaxone: 8
Ciprofloxacin: >4
Moxifloxacin: 4
Tobramycin: 16

## 2019-11-06 LAB — SPECIMEN STATUS REPORT

## 2019-11-06 MED ORDER — GADOBUTROL 1 MMOL/ML IV SOLN
6.5000 mL | Freq: Once | INTRAVENOUS | Status: AC | PRN
  Administered 2019-11-06: 6.5 mL via INTRAVENOUS

## 2019-11-07 ENCOUNTER — Telehealth: Payer: Self-pay | Admitting: *Deleted

## 2019-11-07 NOTE — Telephone Encounter (Signed)
Thank you for the update!

## 2019-11-07 NOTE — Telephone Encounter (Signed)
-----   Message from Rosiland Oz, MD sent at 11/07/2019 12:12 PM EDT ----- Regarding: Follow Up Can you please let her know that her MRI brain is normal and also check on how she is doing with the Bactrim ?

## 2019-11-07 NOTE — Telephone Encounter (Signed)
Relayed to patient. She experienced food poisoning weekend 9/11 with continued nausea with vomiting for 4 days (taking zofran).  Nausea resolved for a few days so she restarted bactrim on 9/18.  Unfortunately nausea/vomiting then returned 9/20.  Restarted zofran 4mg  q8 with bactrim 9/20, seems to control her symptoms.

## 2019-11-07 NOTE — Telephone Encounter (Signed)
Afra wanted to adjust her appointments so she would have labs 3 weeks after restarting bactrim.  New lab is 10/11, new follow up is 10/13.

## 2019-11-08 NOTE — Telephone Encounter (Signed)
That is fine with me.  Thank you! 

## 2019-11-10 ENCOUNTER — Ambulatory Visit: Payer: Medicare PPO | Admitting: Internal Medicine

## 2019-11-10 ENCOUNTER — Other Ambulatory Visit: Payer: Self-pay

## 2019-11-10 ENCOUNTER — Encounter: Payer: Self-pay | Admitting: Internal Medicine

## 2019-11-10 ENCOUNTER — Telehealth: Payer: Self-pay | Admitting: *Deleted

## 2019-11-10 VITALS — BP 130/60 | HR 71 | Temp 97.8°F | Ht 64.5 in | Wt 147.3 lb

## 2019-11-10 DIAGNOSIS — J4540 Moderate persistent asthma, uncomplicated: Secondary | ICD-10-CM | POA: Diagnosis not present

## 2019-11-10 DIAGNOSIS — G4733 Obstructive sleep apnea (adult) (pediatric): Secondary | ICD-10-CM | POA: Diagnosis not present

## 2019-11-10 DIAGNOSIS — Z23 Encounter for immunization: Secondary | ICD-10-CM | POA: Diagnosis not present

## 2019-11-10 DIAGNOSIS — E782 Mixed hyperlipidemia: Secondary | ICD-10-CM

## 2019-11-10 DIAGNOSIS — Z0000 Encounter for general adult medical examination without abnormal findings: Secondary | ICD-10-CM | POA: Diagnosis not present

## 2019-11-10 DIAGNOSIS — K219 Gastro-esophageal reflux disease without esophagitis: Secondary | ICD-10-CM | POA: Diagnosis not present

## 2019-11-10 NOTE — Telephone Encounter (Signed)
Received message from patient. Please advise: I am continuing to have episodes of vomiting if I discontinue taking the Zofran. Ive had two this week, multiple times last week.  The only thing that has changed is starting the Septra DS. Could the vomiting be related to the Septra?   Landis Gandy, RN

## 2019-11-10 NOTE — Progress Notes (Signed)
Established Patient Office Visit     This visit occurred during the SARS-CoV-2 public health emergency.  Safety protocols were in place, including screening questions prior to the visit, additional usage of staff PPE, and extensive cleaning of exam room while observing appropriate contact time as indicated for disinfecting solutions.    CC/Reason for Visit: Annual preventive exam and subsequent Medicare wellness visit  HPI: Beverly [name] is a 74 y.o. female who is coming in today for the above mentioned reasons. Past Medical History is significant for: GERD, asthma, obstructive sleep apnea managed by oral device, hyperlipidemia, chronic pain on Bel[place].  She also has a primary immune deficiency for which she gets IVIG infusions, as well as a history of bronchiectasis and tracheobronchomalacia followed by pulmonary.  She was recently found to have a sputum analysis with nocardia and was started on Septra by infectious diseases.  She has been having issues tolerating it and has been having to use the Zofran.  She has routine eye and dental care.  She is due for flu vaccine but otherwise immunizations are up-to-date.  Her mammogram will be due in October.  She had a colonoscopy in 2013, she has had a complete hysterectomy so no longer does Pap smears.   Past Medical/Surgical History: Past Medical History:  Diagnosis Date  . Allergy   . Amaurosis fugax    negative w/u through [facility]  . Asthma   . Back pain    Dr Joline Salt 02/2010-epidural injection x 2 at L4-5 with good effect  . 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 BCC (basal cell carcinoma of skin) 05/09/2014   mid lower back  . BCC (basal cell carcinoma of skin) 05/03/2017   right low back  . BCC (basal cell carcinoma of skin) 05/03/2017   left upper back  . BCC (basal cell carcinoma of skin) 07/05/2018   left mid back  . BCC (basal cell carcinoma of skin) 05/20/1992   upper back  . BCC (basal cell  carcinoma of skin) 07/29/1993   left sholder medial  . BCC (basal cell carcinoma of skin) 07/29/1993   left sholder lateral  . BCC (basal cell carcinoma of skin) 07/29/1993   right thigh  . BCC (basal cell carcinoma of skin) 07/29/1993   right sholder  . BCC (basal cell carcinoma of skin) 12/22/1994   right mid forearm  . BCC (basal cell carcinoma of skin) 12/22/1994   right upper forearm  . BCC (basal cell carcinoma of skin) 12/22/1994   lower right upper arm  . BCC (basal cell carcinoma of skin) 12/22/1994   right upper arm sholder  . BCC (basal cell carcinoma of skin) 08/11/1995   left leg below knee  . BCC (basal cell carcinoma of skin) 04/11/2002   mid back  . BCC (basal cell carcinoma of skin) 12/10/2002   right center upper back  . BCC (basal cell carcinoma of skin) 05/26/2005   right post sholder  . BCC (basal cell carcinoma) 05/09/2014   left inner shin  . BCC (basal cell carcinoma) 06/12/2014   left forearm  . Bowen's disease 10/07/1994   right post knee, right inner forearm/wrist  . Bowen's disease 08/11/1995   left sholder  . Cataract    left  . Chronic pain   . Depression   . Diverticulosis of colon 1998   mild  . DJD (degenerative joint disease)   . Duodenal ulcer    h/o  .  GERD (gastroesophageal reflux disease)   . History of SCC (squamous cell carcinoma) of skin    Dr. Denna Haggard  . History of sinus bradycardia   . HLD (hyperlipidemia)    hypertriglyceridemia  . Hypertensive retinopathy of both eyes 11/18/2017  . IBS (irritable bowel syndrome)   . Internal hemorrhoids 1998  . Mitral regurgitation    mild  . Osteoarthritis    feet,shoulder,neck,back,hips and hands.  . Pneumonia   . PONV (postoperative nausea and vomiting)   . Rotator cuff tear, right 02/2019   infraspinatus and supraspinatus, and dislocation of long head of bicep tendon (Dr. Alvan Dame)  . SCC (squamous cell carcinoma) 11/26/2014   left hand, right hand, right deltoid  . SCC (squamous  cell carcinoma) 05/03/2017   left cheek  . Sleep apnea    uses a mouth guard nightly  . Squamous cell carcinoma in situ (SCCIS) 07/05/2018   left hand  . Vitamin D deficiency    mild    Past Surgical History:  Procedure Laterality Date  . ABDOMINAL HYSTERECTOMY    . BLEPHAROPLASTY Bilateral 01/2018  . BROW LIFT Bilateral 10/10/2018   Procedure: BROW LIFT;  Surgeon: Wallace Going, DO;  Location: [place];  Service: Plastics;  Laterality: Bilateral;  . BUNIONECTOMY     R 12/08, L 2004 (Dr. Janus Molder)  . CARPAL TUNNEL RELEASE  1989   bilateral  . CATARACT EXTRACTION Bilateral Left in 08/2012, Right 04/2016   Dr.Hecker  . CESAREAN SECTION     x2  . CHOLECYSTECTOMY  1992  . CLOSED REDUCTION NASAL FRACTURE N/A 08/29/2018   Procedure: CLOSED REDUCTION NASAL FRACTURE;  Surgeon: Wallace Going, DO;  Location: [place];  Service: Plastics;  Laterality: N/A;  1 hour, please  . COLONOSCOPY  2006  . COLONOSCOPY  01/2012   due again 01/2022; mild diverticulosis  . epidural steroid injection, back  02/2010  . HIP SURGERY     right bursectomy x 3  . HIP SURGERY Right 2000   torn cartilage, repaired  . INGUINAL HERNIA REPAIR  9/09   bilat  . NECK SURGERY  1989   c6-7 cervical laminectomy and diskecotmy  . NECK SURGERY  1989   per patient posterior area of neck  . REVERSE SHOULDER ARTHROPLASTY Right 04/11/2019   Procedure: REVERSE SHOULDER ARTHROPLASTY;  Surgeon: Nicholes Stairs, MD;  Location: [place];  Service: Orthopedics;  Laterality: Right;  [facility]  . SHOULDER SURGERY  2/05   left rotator cuff repair  . SHOULDER SURGERY Left 03/14/2018   rotator cuff repair; Dr. Tonita Cong  . TONSILLECTOMY  age 13  . TOTAL ABDOMINAL HYSTERECTOMY W/ BILATERAL SALPINGOOPHORECTOMY  1997   fibroids  . TROCHANTERIC BURSA EXCISION Right 1978  . TUBAL LIGATION  1986  . WISDOM TOOTH EXTRACTION      Social History:  reports that she has never smoked.  She has never used smokeless tobacco. She reports current alcohol use of about 7.0 standard drinks of alcohol per week. She reports that she does not use drugs.  Allergies: Allergies  Allergen Reactions  . Adhesive [Tape] Rash  . Codeine Rash    Family History:  Family History  Problem Relation Age of Onset  . Depression Mother   . Schizophrenia Mother   . Cancer Father        oral  . Heart disease Father        bradycardia  . Cancer Sister  skin and lung  . COPD Sister   . Hypertension Sister   . Osteoporosis Sister        compression fx's x 3 11/2018  . Hyperthyroidism Brother   . Cancer Brother        metastatic cancer to bone (?primary)  . Hyperlipidemia Daughter   . Asthma Son   . Cancer Paternal Grandmother 59       colon cancer, metastatic to liver  . Colon cancer Paternal Grandmother   . Diabetes Maternal Grandmother   . ADD / ADHD Other      Current Outpatient Medications:  .  acetaminophen (TYLENOL) 500 MG tablet, Take 1,000 mg by mouth every 6 (six) hours as needed for moderate pain. , Disp: , Rfl:  .  albuterol (PROVENTIL HFA;VENTOLIN HFA) 108 (90 Base) MCG/ACT inhaler, Inhale 2 puffs into the lungs every 6 (six) hours as needed for wheezing or shortness of breath., Disp: 18 g, Rfl: 1 .  amoxicillin (AMOXIL) 500 MG tablet, Take 2,000 mg by mouth as directed. For dental appointments, Disp: , Rfl:  .  aspirin EC 81 MG tablet, Take 81 mg by mouth daily., Disp: , Rfl:  .  atorvastatin (LIPITOR) 40 MG tablet, Take 1 tablet (40 mg total) by mouth daily., Disp: 90 tablet, Rfl: 1 .  BELBUCA 150 MCG FILM, , Disp: , Rfl:  .  Calcium Carbonate-Vitamin D (CALCIUM 600+D) 600-200 MG-UNIT TABS, Take 1 tablet by mouth 2 (two) times daily.  , Disp: , Rfl:  .  celecoxib (CELEBREX) 200 MG capsule, TAKE 1 CAPSULE BY MOUTH TWICE DAILY, Disp: 180 capsule, Rfl: 1 .  diclofenac Sodium (VOLTAREN) 1 % GEL, Apply 2 g topically 4 (four) times daily as needed (joint pain). , Disp:  , Rfl:  .  esomeprazole (NEXIUM) 40 MG capsule, TAKE 1 CAPSULE BY MOUTH DAILY IN THE MORNING, Disp: 90 capsule, Rfl: 0 .  famotidine (PEPCID) 40 MG tablet, Take 1 tablet (40 mg total) by mouth daily., Disp: 90 tablet, Rfl: 0 .  Fluorouracil (TOLAK) 4 % CREA, Apply 1 application topically every 3 (three) days. Dr Denna Haggard, Disp: , Rfl:  .  gabapentin (NEURONTIN) 800 MG tablet, Take 800 mg by mouth at bedtime. , Disp: , Rfl:  .  GUAIFENESIN ER PO, Take 400 mg by mouth 2 (two) times daily. , Disp: , Rfl:  .  HYDROcodone-acetaminophen (NORCO) 7.5-325 MG tablet, Take 1 tablet by mouth every 12 (twelve) hours as needed for moderate pain. Dr Hardin Negus, Disp: , Rfl:  .  loperamide (IMODIUM A-D) 2 MG tablet, Take 2 mg by mouth 4 (four) times daily as needed for diarrhea or loose stools., Disp: , Rfl:  .  methocarbamol (ROBAXIN) 500 MG tablet, Take 500 mg by mouth in the morning and at bedtime. , Disp: , Rfl:  .  montelukast (SINGULAIR) 10 MG tablet, Take 1 tablet (10 mg total) by mouth at bedtime., Disp: 90 tablet, Rfl: 1 .  Multiple Vitamins-Minerals (MULTIVITAMIN WITH MINERALS) tablet, Take 1 tablet by mouth daily., Disp: , Rfl:  .  niacinamide 500 MG tablet, Take 500 mg by mouth 2 (two) times daily with a meal., Disp: , Rfl:  .  omega-3 acid ethyl esters (LOVAZA) 1 g capsule, TAKE 2 CAPSULES BY MOUTH TWICE DAILY, Disp: 180 capsule, Rfl: 0 .  ondansetron (ZOFRAN) 4 MG tablet, Take 1 tablet (4 mg total) by mouth every 8 (eight) hours as needed for nausea or vomiting., Disp: 20 tablet, Rfl: 0 .  PARoxetine (PAXIL) 30 MG tablet, Take 60 mg by mouth daily. , Disp: , Rfl:  .  Respiratory Therapy Supplies (FLUTTER) DEVI, 1 puff by Does not apply route 2 (two) times daily., Disp: 1 each, Rfl: 0 .  sodium chloride HYPERTONIC 3 % nebulizer solution, Take by nebulization in the morning and at bedtime., Disp: 750 mL, Rfl: 12 .  sulfamethoxazole-trimethoprim (BACTRIM DS) 800-160 MG tablet, Take 2 tablets by mouth in the  morning, at noon, and at bedtime., Disp: 90 tablet, Rfl: 5  Review of Systems:  Constitutional: Denies fever, chills, diaphoresis, appetite change and fatigue.  HEENT: Denies photophobia, eye pain, redness, hearing loss, ear pain, congestion, sore throat, rhinorrhea, sneezing, mouth sores, trouble swallowing, neck pain, neck stiffness and tinnitus.   Respiratory: Denies SOB, DOE, cough, chest tightness,  and wheezing.   Cardiovascular: Denies chest pain, palpitations and leg swelling.  Gastrointestinal: Denies nausea, vomiting, abdominal pain, diarrhea, constipation, blood in stool and abdominal distention.  Genitourinary: Denies dysuria, urgency, frequency, hematuria, flank pain and difficulty urinating.  Endocrine: Denies: hot or cold intolerance, sweats, changes in hair or nails, polyuria, polydipsia. Musculoskeletal: Denies myalgias, back pain, joint swelling, arthralgias and gait problem.  Skin: Denies pallor, rash and wound.  Neurological: Denies dizziness, seizures, syncope, weakness, light-headedness, numbness and headaches.  Hematological: Denies adenopathy. Easy bruising, personal or family bleeding history  Psychiatric/Behavioral: Denies suicidal ideation, mood changes, confusion, nervousness, sleep disturbance and agitation    Physical Exam: Vitals:   11/10/19 0930  BP: 130/60  Pulse: 71  Temp: 97.8 F (36.6 C)  TempSrc: Oral  SpO2: 95%  Weight: 147 lb 4.8 oz (66.8 kg)  Height: 5' 4.5" (1.638 m)    Body mass index is 24.89 kg/m.   Constitutional: NAD, calm, comfortable Eyes: PERRL, lids and conjunctivae normal, wears corrective lenses ENMT: Mucous membranes are moist.Tympanic membrane is pearly white, no erythema or bulging. Neck: normal, supple, no masses, no thyromegaly Respiratory: clear to auscultation bilaterally, no wheezing, no crackles. Normal respiratory effort. No accessory muscle use.  Cardiovascular: Regular rate and rhythm, no murmurs / rubs / gallops.  No extremity edema. 2+ pedal pulses.   Abdomen: no tenderness, no masses palpated. No hepatosplenomegaly. Bowel sounds positive.  Musculoskeletal: no clubbing / cyanosis. No joint deformity upper and lower extremities. Good ROM, no contractures. Normal muscle tone.  Skin: no rashes, lesions, ulcers. No induration Neurologic: CN 2-12 grossly intact. Sensation intact, DTR normal. Strength 5/5 in all 4.  Psychiatric: Normal judgment and insight. Alert and oriented x 3. Normal mood.   Subsequent Medicare wellness visit   1. Risk factors, based on past  M,S,F -cardiovascular disease risk factors include age, history of hyperlipidemia   2.  Physical activities: She is sedentary currently   3.  Depression/mood:  Stable, not depressed   4.  Hearing:  No perceived issues   5.  ADL's: Independent in all ADLs   6.  Fall risk:  Low to moderate fall risk   7.  Home safety: No problems identified   8.  Height weight, and visual acuity: Height and weight as above, visual acuity is 20/25 on the left, 20/20 on the right and 20/20 combined   9.  Counseling:  Advised 30 to 45 minutes of physical activity 3 times a week   10. Lab orders based on risk factors: Laboratory update will be reviewed   11. Referral :  None today   12. Care plan:  Follow-up with me in 1 year or as  needed   13. Cognitive assessment:  No cognitive impairment   14. Screening: Patient provided with a written and personalized 5-10 year screening schedule in the AVS.   yes   15. Provider List Update:   PCP, infectious diseases, cardiology, pulmonary (Dr. Stephens Shire)  64. Advance Directives: Full code     Office Visit from 08/09/2019 in [place] at [facility] Total Score 0      Fall Risk  10/25/2019 07/13/2018 06/10/2017 03/12/2016 11/26/2014  Falls in the past year? 1 0 No Yes -  Number falls in past yr: 0 - - 1 (No Data)  Comment - - - - fell whlie in [place] (slipped on slick/wet tiles)  Injury with Fall? 1 - -  No (No Data)  Comment - - - - just a slightly skinned knee  Risk for fall due to : History of fall(s) - - - -  Follow up Falls evaluation completed - - - Education provided     Impression and Plan:  Encounter for preventive health examination -She has routine eye and dental care. -Flu vaccine today, otherwise immunizations are up-to-date. -Healthy lifestyle discussed in detail. -Mammogram will be due in October. -She no longer does Pap smears due to her history of complete hysterectomy. -She had a colonoscopy in 2013 and is a 10-year callback.  Gastroesophageal reflux disease without esophagitis -Well-controlled on daily esomeprazole.  OSA (obstructive sleep apnea) -Managed with an oral device.  Mixed hyperlipidemia -Last LDL was 73, on atorvastatin 40 mg.  Moderate persistent asthmatic bronchitis without complication -Stable, followed by pulmonary and allergy  Need for influenza vaccination -Flu vaccine administered today.   Patient Instructions  -Nice seeing you today!!  -Flu vaccine today.  -Schedule follow up in 1 year or sooner as needed.   Preventive Care 77 Years and Older, Female Preventive care refers to lifestyle choices and visits with your health care provider that can promote health and wellness. This includes:  A yearly physical exam. This is also called an annual well check.  Regular dental and eye exams.  Immunizations.  Screening for certain conditions.  Healthy lifestyle choices, such as diet and exercise. What can I expect for my preventive care visit? Physical exam Your health care provider will check:  Height and weight. These may be used to calculate body mass index (BMI), which is a measurement that tells if you are at a healthy weight.  Heart rate and blood pressure.  Your skin for abnormal spots. Counseling Your health care provider may ask you questions about:  Alcohol, tobacco, and drug use.  Emotional well-being.  Home  and relationship well-being.  Sexual activity.  Eating habits.  History of falls.  Memory and ability to understand (cognition).  Work and work Statistician.  Pregnancy and menstrual history. What immunizations do I need?  Influenza (flu) vaccine  This is recommended every year. Tetanus, diphtheria, and pertussis (Tdap) vaccine  You may need a Td booster every 10 years. Varicella (chickenpox) vaccine  You may need this vaccine if you have not already been vaccinated. Zoster (shingles) vaccine  You may need this after age 14. Pneumococcal conjugate (PCV13) vaccine  One dose is recommended after age 94. Pneumococcal polysaccharide (PPSV23) vaccine  One dose is recommended after age 12. Measles, mumps, and rubella (MMR) vaccine  You may need at least one dose of MMR if you were born in 1957 or later. You may also need a second dose. Meningococcal conjugate (MenACWY) vaccine  You may need  this if you have certain conditions. Hepatitis A vaccine  You may need this if you have certain conditions or if you travel or work in places where you may be exposed to hepatitis A. Hepatitis B vaccine  You may need this if you have certain conditions or if you travel or work in places where you may be exposed to hepatitis B. Haemophilus influenzae type b (Hib) vaccine  You may need this if you have certain conditions. You may receive vaccines as individual doses or as more than one vaccine together in one shot (combination vaccines). Talk with your health care provider about the risks and benefits of combination vaccines. What tests do I need? Blood tests  Lipid and cholesterol levels. These may be checked every 5 years, or more frequently depending on your overall health.  Hepatitis C test.  Hepatitis B test. Screening  Lung cancer screening. You may have this screening every year starting at age 48 if you have a 30-pack-year history of smoking and currently smoke or have  quit within the past 15 years.  Colorectal cancer screening. All adults should have this screening starting at age 33 and continuing until age 36. Your health care provider may recommend screening at age 15 if you are at increased risk. You will have tests every 1-10 years, depending on your results and the type of screening test.  Diabetes screening. This is done by checking your blood sugar (glucose) after you have not eaten for a while (fasting). You may have this done every 1-3 years.  Mammogram. This may be done every 1-2 years. Talk with your health care provider about how often you should have regular mammograms.  BRCA-related cancer screening. This may be done if you have a family history of breast, ovarian, tubal, or peritoneal cancers. Other tests  Sexually transmitted disease (STD) testing.  Bone density scan. This is done to screen for osteoporosis. You may have this done starting at age 106. Follow these instructions at home: Eating and drinking  Eat a diet that includes fresh fruits and vegetables, whole grains, lean protein, and low-fat dairy products. Limit your intake of foods with high amounts of sugar, saturated fats, and salt.  Take vitamin and mineral supplements as recommended by your health care provider.  Do not drink alcohol if your health care provider tells you not to drink.  If you drink alcohol: ? Limit how much you have to 0-1 drink a day. ? Be aware of how much alcohol is in your drink. In the [place] one drink equals one 12 oz bottle of beer (355 mL), one 5 oz glass of wine (148 mL), or one 1 oz glass of hard liquor (44 mL). Lifestyle  Take daily care of your teeth and gums.  Stay active. Exercise for at least 30 minutes on 5 or more days each week.  Do not use any products that contain nicotine or tobacco, such as cigarettes, e-cigarettes, and chewing tobacco. If you need help quitting, ask your health care provider.  If you are sexually active,  practice safe sex. Use a condom or other form of protection in order to prevent STIs (sexually transmitted infections).  Talk with your health care provider about taking a low-dose aspirin or statin. What's next?  Go to your health care provider once a year for a well check visit.  Ask your health care provider how often you should have your eyes and teeth checked.  Stay up to date on all vaccines. This  information is not intended to replace advice given to you by your health care provider. Make sure you discuss any questions you have with your health care provider. Document Revised: 01/27/2018 Document Reviewed: 01/27/2018 Elsevier Patient Education  2020 [place], [place] [facility]

## 2019-11-10 NOTE — Addendum Note (Signed)
Addended by: Westley Hummer B on: 11/10/2019 04:52 PM   Modules accepted: Orders

## 2019-11-10 NOTE — Patient Instructions (Addendum)
-Nice seeing you today!!  -Flu vaccine today.  -Schedule follow up in 1 year or sooner as needed.   Preventive Care 1 Years and Older, Female Preventive care refers to lifestyle choices and visits with your health care provider that can promote health and wellness. This includes:  A yearly physical exam. This is also called an annual well check.  Regular dental and eye exams.  Immunizations.  Screening for certain conditions.  Healthy lifestyle choices, such as diet and exercise. What can I expect for my preventive care visit? Physical exam Your health care provider will check:  Height and weight. These may be used to calculate body mass index (BMI), which is a measurement that tells if you are at a healthy weight.  Heart rate and blood pressure.  Your skin for abnormal spots. Counseling Your health care provider may ask you questions about:  Alcohol, tobacco, and drug use.  Emotional well-being.  Home and relationship well-being.  Sexual activity.  Eating habits.  History of falls.  Memory and ability to understand (cognition).  Work and work Statistician.  Pregnancy and menstrual history. What immunizations do I need?  Influenza (flu) vaccine  This is recommended every year. Tetanus, diphtheria, and pertussis (Tdap) vaccine  You may need a Td booster every 10 years. Varicella (chickenpox) vaccine  You may need this vaccine if you have not already been vaccinated. Zoster (shingles) vaccine  You may need this after age 17. Pneumococcal conjugate (PCV13) vaccine  One dose is recommended after age 51. Pneumococcal polysaccharide (PPSV23) vaccine  One dose is recommended after age 6. Measles, mumps, and rubella (MMR) vaccine  You may need at least one dose of MMR if you were born in 1957 or later. You may also need a second dose. Meningococcal conjugate (MenACWY) vaccine  You may need this if you have certain conditions. Hepatitis A  vaccine  You may need this if you have certain conditions or if you travel or work in places where you may be exposed to hepatitis A. Hepatitis B vaccine  You may need this if you have certain conditions or if you travel or work in places where you may be exposed to hepatitis B. Haemophilus influenzae type b (Hib) vaccine  You may need this if you have certain conditions. You may receive vaccines as individual doses or as more than one vaccine together in one shot (combination vaccines). Talk with your health care provider about the risks and benefits of combination vaccines. What tests do I need? Blood tests  Lipid and cholesterol levels. These may be checked every 5 years, or more frequently depending on your overall health.  Hepatitis C test.  Hepatitis B test. Screening  Lung cancer screening. You may have this screening every year starting at age 10 if you have a 30-pack-year history of smoking and currently smoke or have quit within the past 15 years.  Colorectal cancer screening. All adults should have this screening starting at age 39 and continuing until age 49. Your health care provider may recommend screening at age 24 if you are at increased risk. You will have tests every 1-10 years, depending on your results and the type of screening test.  Diabetes screening. This is done by checking your blood sugar (glucose) after you have not eaten for a while (fasting). You may have this done every 1-3 years.  Mammogram. This may be done every 1-2 years. Talk with your health care provider about how often you should have regular mammograms.  BRCA-related cancer screening. This may be done if you have a family history of breast, ovarian, tubal, or peritoneal cancers. Other tests  Sexually transmitted disease (STD) testing.  Bone density scan. This is done to screen for osteoporosis. You may have this done starting at age 65. Follow these instructions at home: Eating and  drinking  Eat a diet that includes fresh fruits and vegetables, whole grains, lean protein, and low-fat dairy products. Limit your intake of foods with high amounts of sugar, saturated fats, and salt.  Take vitamin and mineral supplements as recommended by your health care provider.  Do not drink alcohol if your health care provider tells you not to drink.  If you drink alcohol: ? Limit how much you have to 0-1 drink a day. ? Be aware of how much alcohol is in your drink. In the [place] one drink equals one 12 oz bottle of beer (355 mL), one 5 oz glass of wine (148 mL), or one 1 oz glass of hard liquor (44 mL). Lifestyle  Take daily care of your teeth and gums.  Stay active. Exercise for at least 30 minutes on 5 or more days each week.  Do not use any products that contain nicotine or tobacco, such as cigarettes, e-cigarettes, and chewing tobacco. If you need help quitting, ask your health care provider.  If you are sexually active, practice safe sex. Use a condom or other form of protection in order to prevent STIs (sexually transmitted infections).  Talk with your health care provider about taking a low-dose aspirin or statin. What's next?  Go to your health care provider once a year for a well check visit.  Ask your health care provider how often you should have your eyes and teeth checked.  Stay up to date on all vaccines. This information is not intended to replace advice given to you by your health care provider. Make sure you discuss any questions you have with your health care provider. Document Revised: 01/27/2018 Document Reviewed: 01/27/2018 Elsevier Patient Education  2020 Elsevier Inc.   

## 2019-11-13 ENCOUNTER — Other Ambulatory Visit: Payer: Self-pay | Admitting: *Deleted

## 2019-11-13 ENCOUNTER — Other Ambulatory Visit: Payer: Self-pay | Admitting: Infectious Diseases

## 2019-11-13 DIAGNOSIS — A439 Nocardiosis, unspecified: Secondary | ICD-10-CM

## 2019-11-13 MED ORDER — SULFAMETHOXAZOLE-TRIMETHOPRIM 800-160 MG PO TABS: 2.0000 | ORAL_TABLET | Freq: Three times a day (TID) | ORAL | 3 refills | Status: AC

## 2019-11-13 MED ORDER — SULFAMETHOXAZOLE-TRIMETHOPRIM 800-160 MG PO TABS: 2.0000 | ORAL_TABLET | Freq: Three times a day (TID) | ORAL | 0 refills | Status: DC

## 2019-11-13 MED FILL — SULFAMETHOXAZOLE-TMP DS TAB: 800-160 | 15 days supply | Qty: 90 | Fill #1

## 2019-11-13 NOTE — Progress Notes (Signed)
90 day supply per patient request. Landis Gandy, RN

## 2019-11-13 NOTE — Telephone Encounter (Signed)
Thanks

## 2019-11-14 ENCOUNTER — Telehealth: Payer: Self-pay | Admitting: Infectious Diseases

## 2019-11-14 MED FILL — GABAPENTIN 800 MG TABS: 800 | 90 days supply | Qty: 90 | Fill #1

## 2019-11-16 DIAGNOSIS — M25511 Pain in right shoulder: Secondary | ICD-10-CM | POA: Diagnosis not present

## 2019-11-17 ENCOUNTER — Telehealth: Payer: Self-pay

## 2019-11-17 ENCOUNTER — Other Ambulatory Visit: Payer: Self-pay | Admitting: Infectious Diseases

## 2019-11-17 MED ORDER — ONDANSETRON HCL 4 MG PO TABS: 4.0000 mg | ORAL_TABLET | Freq: Three times a day (TID) | ORAL | 2 refills | Status: DC | PRN

## 2019-11-17 NOTE — Telephone Encounter (Signed)
Received refill request of Zofran. Patient states she has continued to take the zofran two times a day, which has help prevent nausea/vomiting. States she thinks her body will need some time to adjust to antibiotics. Routing message to provider if ok to refill zofran. Eugenia Mcalpine

## 2019-11-17 NOTE — Telephone Encounter (Signed)
Sure. I can send a refill

## 2019-11-18 MED FILL — ONDANSETRON HCL 4 MG TABS: 4 | 20 days supply | Qty: 60 | Fill #0

## 2019-11-22 DIAGNOSIS — M65312 Trigger thumb, left thumb: Secondary | ICD-10-CM | POA: Diagnosis not present

## 2019-11-22 MED FILL — ATORVASTATIN 40 MG TABLET: 40 | 90 days supply | Qty: 90 | Fill #1

## 2019-11-23 ENCOUNTER — Ambulatory Visit: Payer: Medicare PPO | Admitting: Infectious Diseases

## 2019-11-23 DIAGNOSIS — H35033 Hypertensive retinopathy, bilateral: Secondary | ICD-10-CM | POA: Diagnosis not present

## 2019-11-23 DIAGNOSIS — H35373 Puckering of macula, bilateral: Secondary | ICD-10-CM | POA: Diagnosis not present

## 2019-11-23 DIAGNOSIS — H524 Presbyopia: Secondary | ICD-10-CM | POA: Diagnosis not present

## 2019-11-23 DIAGNOSIS — H26493 Other secondary cataract, bilateral: Secondary | ICD-10-CM | POA: Diagnosis not present

## 2019-11-23 DIAGNOSIS — H04123 Dry eye syndrome of bilateral lacrimal glands: Secondary | ICD-10-CM | POA: Diagnosis not present

## 2019-11-27 ENCOUNTER — Other Ambulatory Visit: Payer: Medicare PPO

## 2019-11-27 ENCOUNTER — Other Ambulatory Visit: Payer: Self-pay

## 2019-11-27 DIAGNOSIS — A439 Nocardiosis, unspecified: Secondary | ICD-10-CM

## 2019-11-27 LAB — BASIC METABOLIC PANEL
BUN/Creatinine Ratio: 11 (calc) (ref 6–22)
BUN: 14 mg/dL (ref 7–25)
CO2: 26 mmol/L (ref 20–32)
Calcium: 9.1 mg/dL (ref 8.6–10.4)
Chloride: 104 mmol/L (ref 98–110)
Creat: 1.24 mg/dL — ABNORMAL HIGH (ref 0.60–0.93)
Glucose, Bld: 111 mg/dL — ABNORMAL HIGH (ref 65–99)
Potassium: 4.2 mmol/L (ref 3.5–5.3)
Sodium: 137 mmol/L (ref 135–146)

## 2019-11-27 NOTE — Telephone Encounter (Signed)
Error

## 2019-11-28 ENCOUNTER — Telehealth: Payer: Self-pay

## 2019-11-28 NOTE — Telephone Encounter (Signed)
Patient notified of lab work and instructed to decrease Bactrim to BID. Patient verbalized understanding and reminded of follow up appointment tomorrow at 10:15.   Ajah Vanhoose Lorita Officer, RN

## 2019-11-28 NOTE — Telephone Encounter (Signed)
-----   Message from Rosiland Oz, MD sent at 11/28/2019  8:08 AM EDT ----- Regarding: Bactrim Could you please let her know that her Cr was little high in her last BMP and hence, I want to decrease Bactrim from 2 tabs PO TID to 2 tabs PO BID?  Also tell her to drink plenty of fluids.   Thanks.

## 2019-11-29 ENCOUNTER — Ambulatory Visit: Payer: Medicare PPO | Admitting: Infectious Diseases

## 2019-11-29 ENCOUNTER — Other Ambulatory Visit: Payer: Self-pay

## 2019-11-29 ENCOUNTER — Encounter: Payer: Self-pay | Admitting: Infectious Diseases

## 2019-11-29 VITALS — BP 148/70 | HR 62 | Temp 97.7°F | Wt 150.0 lb

## 2019-11-29 DIAGNOSIS — Z5181 Encounter for therapeutic drug level monitoring: Secondary | ICD-10-CM

## 2019-11-29 DIAGNOSIS — A439 Nocardiosis, unspecified: Secondary | ICD-10-CM

## 2019-11-29 MED FILL — SULFAMETHOXAZOLE-TMP DS TAB: 800-160 | 15 days supply | Qty: 90 | Fill #2

## 2019-11-29 NOTE — Progress Notes (Signed)
[facility]                                                             [place], [place], [place], [address]                                                                  Phn. [phone]; Fax: [phone]                                                                             Date: 11/29/2019  Reason for Follow Up: Nocardia Pnuemonia  Assessment/Plan Beverly [name] is a 74 year old female with a history of mild persistent asthma, GERD, Bronchiectasis, OSA, Tracheomalacia, Primary immunodeficiency on immunoglobulin therapy who was recently diagnosed with Nocardia nova PNA with positive sputum cx  on 07/21/19 and was started on Bactrim therapy ( 15mg /kg/day) from 10/25/19. She initially had nausea/vomiting with bactrim which was managed with Zofran prn. She says the nausea and vomiting is resolved now she has been taking zofran twice a day. However, she has developed an AKI with Cr of 1.24 from 0.69.   Her primary complaint has been cough. She has no difficulty breathing walking for approx 45 mins. Her cough seems to be multifactorial- GERD, LPR, mild persistent asthma including Nocardia PNA. She has been following up with Pulmonary. She has been using hypertonic saline neubuliser and flutter valve. Has been on Esomeprazole and Famotidine for GERD. Oral appliance for OSA. She is also getting monthly injections with immunoglobulins for primary hypogammaglobulinemia.  Given her mild- moderate bronchiectasis with negative findings on MRI brain and recent AKI, will decrease dosing of Bactrim to Bactrim DS 1 tab PO BID. I will have her repeat a BMP on Friday to check her Cr. I have encouraged her to increase fluid intake in the meantime. Will follow up on K for further recommendations  Follow up for management of GERD/Asthma/LPR and Hypogammaglobulinemia per Pulmonary and Immunology   Possibly  follow up in 4 weeks   I spent greater than 25  minutes with the patient including greater than 50% of time in face to face counsel of the patient and in coordination of their care.   Rosiland Oz, MD [facility]  Office phone [phone] Fax no. [phone] ______________________________________________________________________________________________________________________ Subjective  Beverly [name] is a 75 year old female with a history of mild persistent asthma, GERD, Bronchiectasis, OSA, Tracheomalacia, Primary immunodeficiency on immunoglobulin therapy who was recently diagnosed with Nocardia nova PNA on 07/21/19 and was started on Bactrim therapy ( 15mg /kg/day) from 10/25/19. She initially had nausea/vomiting with bactrim which was managed with Zofran prn. She says the nausea and vomiting is resolved now she has been taking zofran twice a day. Denies having any  fever, chills. Denies any new rashes, diarrhea.   She says her cough has been more or less the same. She has bouts of cough in the morning and evening. She says she usually does not cough while she takes a nap around 11 am. Cough is productive with phlegm which has mucus, sometimes thick and stringy. She is also mucinex twice a day that he helping her cough up the phlegm. She says her main concern is cough and not really SOB. She says she talks to her daughter almost 12 everyday on phone while walking for 45 minutes.   I discussed with her about her recent lab results showing elevation in Cr. I will decrease her Bactrim from 3 tabs PO TID to 1 tab PO BID. She has been drinking plenty of fluids. I encouraged her to keep on continuing fluid intake. This will also help with the nausea and vomiting. I will have her a nursing visit on Friday for BMP to check her Cr.   She has been following with her PCP routinely and Pulmonary for management of OSA/bronchiectasis/asthma/GERD. She is getting weekly  Immunoglobulin injections by her Allergist. Today she is going to get her 5th dose   ROS: 10 point ROS negative except as above   Past Medical History:  Diagnosis Date  . Allergy   . Amaurosis fugax    negative w/u through [facility]  . Asthma   . Back pain    Dr Joline Salt 02/2010-epidural injection x 2 at L4-5 with good effect  . 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 BCC (basal cell carcinoma of skin) 05/09/2014   mid lower back  . BCC (basal cell carcinoma of skin) 05/03/2017   right low back  . BCC (basal cell carcinoma of skin) 05/03/2017   left upper back  . BCC (basal cell carcinoma of skin) 07/05/2018   left mid back  . BCC (basal cell carcinoma of skin) 05/20/1992   upper back  . BCC (basal cell carcinoma of skin) 07/29/1993   left sholder medial  . BCC (basal cell carcinoma of skin) 07/29/1993   left sholder lateral  . BCC (basal cell carcinoma of skin) 07/29/1993   right thigh  . BCC (basal cell carcinoma of skin) 07/29/1993   right sholder  . BCC (basal cell carcinoma of skin) 12/22/1994   right mid forearm  . BCC (basal cell carcinoma of skin) 12/22/1994   right upper forearm  . BCC (basal cell carcinoma of skin) 12/22/1994   lower right upper arm  . BCC (basal cell carcinoma of skin) 12/22/1994   right upper arm sholder  . BCC (basal cell carcinoma of skin) 08/11/1995   left leg below knee  . BCC (basal cell carcinoma of skin) 04/11/2002   mid back  . BCC (basal cell carcinoma of skin) 12/10/2002   right center upper back  . BCC (basal cell carcinoma of skin) 05/26/2005   right post sholder  . BCC (basal cell carcinoma) 05/09/2014   left inner shin  . BCC (basal cell carcinoma) 06/12/2014   left forearm  . Bowen's disease 10/07/1994   right post knee, right inner forearm/wrist  . Bowen's disease 08/11/1995   left sholder  . Cataract    left  . Chronic pain   . Depression   . Diverticulosis of colon 1998   mild  . DJD (degenerative  joint disease)   . Duodenal ulcer    h/o  . GERD (gastroesophageal reflux disease)   .  History of SCC (squamous cell carcinoma) of skin    Dr. Denna Haggard  . History of sinus bradycardia   . HLD (hyperlipidemia)    hypertriglyceridemia  . Hypertensive retinopathy of both eyes 11/18/2017  . IBS (irritable bowel syndrome)   . Internal hemorrhoids 1998  . Mitral regurgitation    mild  . Osteoarthritis    feet,shoulder,neck,back,hips and hands.  . Pneumonia   . PONV (postoperative nausea and vomiting)   . Rotator cuff tear, right 02/2019   infraspinatus and supraspinatus, and dislocation of long head of bicep tendon (Dr. Alvan Dame)  . SCC (squamous cell carcinoma) 11/26/2014   left hand, right hand, right deltoid  . SCC (squamous cell carcinoma) 05/03/2017   left cheek  . Sleep apnea    uses a mouth guard nightly  . Squamous cell carcinoma in situ (SCCIS) 07/05/2018   left hand  . Vitamin D deficiency    mild   Past Surgical History:  Procedure Laterality Date  . ABDOMINAL HYSTERECTOMY    . BLEPHAROPLASTY Bilateral 01/2018  . BROW LIFT Bilateral 10/10/2018   Procedure: BROW LIFT;  Surgeon: Wallace Going, DO;  Location: [facility];  Service: Plastics;  Laterality: Bilateral;  . BUNIONECTOMY     R 12/08, L 2004 (Dr. Janus Molder)  . CARPAL TUNNEL RELEASE  1989   bilateral  . CATARACT EXTRACTION Bilateral Left in 08/2012, Right 04/2016   Dr.Hecker  . CESAREAN SECTION     x2  . CHOLECYSTECTOMY  1992  . CLOSED REDUCTION NASAL FRACTURE N/A 08/29/2018   Procedure: CLOSED REDUCTION NASAL FRACTURE;  Surgeon: Wallace Going, DO;  Location: [facility];  Service: Plastics;  Laterality: N/A;  1 hour, please  . COLONOSCOPY  2006  . COLONOSCOPY  01/2012   due again 01/2022; mild diverticulosis  . epidural steroid injection, back  02/2010  . HIP SURGERY     right bursectomy x 3  . HIP SURGERY Right 2000   torn cartilage, repaired  . INGUINAL HERNIA REPAIR   9/09   bilat  . NECK SURGERY  1989   c6-7 cervical laminectomy and diskecotmy  . NECK SURGERY  1989   per patient posterior area of neck  . REVERSE SHOULDER ARTHROPLASTY Right 04/11/2019   Procedure: REVERSE SHOULDER ARTHROPLASTY;  Surgeon: Nicholes Stairs, MD;  Location: [facility];  Service: Orthopedics;  Laterality: Right;  [facility]  . SHOULDER SURGERY  2/05   left rotator cuff repair  . SHOULDER SURGERY Left 03/14/2018   rotator cuff repair; Dr. Tonita Cong  . TONSILLECTOMY  age 64  . TOTAL ABDOMINAL HYSTERECTOMY W/ BILATERAL SALPINGOOPHORECTOMY  1997   fibroids  . TROCHANTERIC BURSA EXCISION Right 1978  . TUBAL LIGATION  1986  . WISDOM TOOTH EXTRACTION     Current Outpatient Medications on File Prior to Visit  Medication Sig Dispense Refill  . acetaminophen (TYLENOL) 500 MG tablet Take 1,000 mg by mouth every 6 (six) hours as needed for moderate pain.     Marland Kitchen albuterol (PROVENTIL HFA;VENTOLIN HFA) 108 (90 Base) MCG/ACT inhaler Inhale 2 puffs into the lungs every 6 (six) hours as needed for wheezing or shortness of breath. 18 g 1  . amoxicillin (AMOXIL) 500 MG tablet Take 2,000 mg by mouth as directed. For dental appointments    . aspirin EC 81 MG tablet Take 81 mg by mouth daily.    Marland Kitchen atorvastatin (LIPITOR) 40 MG tablet Take 1 tablet (40 mg total) by mouth daily. 90 tablet  1  . BELBUCA 150 MCG FILM     . Calcium Carbonate-Vitamin D (CALCIUM 600+D) 600-200 MG-UNIT TABS Take 1 tablet by mouth 2 (two) times daily.      . celecoxib (CELEBREX) 200 MG capsule TAKE 1 CAPSULE BY MOUTH TWICE DAILY 180 capsule 1  . diclofenac Sodium (VOLTAREN) 1 % GEL Apply 2 g topically 4 (four) times daily as needed (joint pain).     Marland Kitchen esomeprazole (NEXIUM) 40 MG capsule TAKE 1 CAPSULE BY MOUTH DAILY IN THE MORNING 90 capsule 0  . famotidine (PEPCID) 40 MG tablet Take 1 tablet (40 mg total) by mouth daily. 90 tablet 0  . Fluorouracil (TOLAK) 4 % CREA Apply 1 application topically every 3 (three) days. Dr  Denna Haggard    . gabapentin (NEURONTIN) 800 MG tablet Take 800 mg by mouth at bedtime.     . GUAIFENESIN ER PO Take 400 mg by mouth 2 (two) times daily.     Marland Kitchen HYDROcodone-acetaminophen (NORCO) 7.5-325 MG tablet Take 1 tablet by mouth every 12 (twelve) hours as needed for moderate pain. Dr Hardin Negus    . loperamide (IMODIUM A-D) 2 MG tablet Take 2 mg by mouth 4 (four) times daily as needed for diarrhea or loose stools.    . methocarbamol (ROBAXIN) 500 MG tablet Take 500 mg by mouth in the morning and at bedtime.     . montelukast (SINGULAIR) 10 MG tablet Take 1 tablet (10 mg total) by mouth at bedtime. 90 tablet 1  . Multiple Vitamins-Minerals (MULTIVITAMIN WITH MINERALS) tablet Take 1 tablet by mouth daily.    . niacinamide 500 MG tablet Take 500 mg by mouth 2 (two) times daily with a meal.    . omega-3 acid ethyl esters (LOVAZA) 1 g capsule TAKE 2 CAPSULES BY MOUTH TWICE DAILY 180 capsule 0  . ondansetron (ZOFRAN) 4 MG tablet Take 1 tablet (4 mg total) by mouth every 8 (eight) hours as needed for nausea or vomiting. 60 tablet 2  . PARoxetine (PAXIL) 30 MG tablet Take 60 mg by mouth daily.     Marland Kitchen Respiratory Therapy Supplies (FLUTTER) DEVI 1 puff by Does not apply route 2 (two) times daily. 1 each 0  . sodium chloride HYPERTONIC 3 % nebulizer solution Take by nebulization in the morning and at bedtime. 750 mL 12  . sulfamethoxazole-trimethoprim (BACTRIM DS) 800-160 MG tablet Take 2 tablets by mouth in the morning, at noon, and at bedtime. (Patient taking differently: Take 1 tablet by mouth 2 (two) times daily. ) 90 tablet 3   No current facility-administered medications on file prior to visit.   Allergies  Allergen Reactions  . Adhesive [Tape] Rash  . Codeine Rash   Social History   Socioeconomic History  . Marital status: Divorced    Spouse name: Not on file  . Number of children: 2  . Years of education: Not on file  . Highest education level: Not on file  Occupational History  .  Occupation: STAFF Optician, dispensing: [employer]  Tobacco Use  . Smoking status: Never Smoker  . Smokeless tobacco: Never Used  Vaping Use  . Vaping Use: Never used  Substance and Sexual Activity  . Alcohol use: Yes    Alcohol/week: 7.0 standard drinks    Types: 7 Glasses of wine per week    Comment: 1 glass of wine daily  . Drug use: No  . Sexual activity: Not Currently  Other Topics Concern  . 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   [place] for [employer] part-time   Social Determinants of Health   Financial Resource Strain:   . Difficulty of Paying Living Expenses: Not on file  Food Insecurity:   . Worried About Charity fundraiser in the Last Year: Not on file  . Ran Out of Food in the Last Year: Not on file  Transportation Needs:   . Lack of Transportation (Medical): Not on file  . Lack of Transportation (Non-Medical): Not on file  Physical Activity:   . Days of Exercise per Week: Not on file  . Minutes of Exercise per Session: Not on file  Stress:   . Feeling of Stress : Not on file  Social Connections:   . Frequency of Communication with Friends and Family: Not on file  . Frequency of Social Gatherings with Friends and Family: Not on file  . Attends Religious Services: Not on file  . Active Member of Clubs or Organizations: Not on file  . Attends Archivist Meetings: Not on file  . Marital Status: Not on file  Intimate Partner Violence:   . Fear of Current or Ex-Partner: Not on file  . Emotionally Abused: Not on file  . Physically Abused: Not on file  . Sexually Abused: Not on file     Vitals BP (!) 148/70   Pulse 62   Temp 97.7 F (36.5 C) (Oral)   Wt 150 lb (68 kg)   BMI 25.35 kg/m    Examination  General - not in acute  distress, comfortably sitting in chair HEENT - PEERLA, no pallor and no icterus Chest - BILATERAL AIR ENTRY, MOSTLY CLEAR WITH OCCASIONAL RALES  CVS- Normal s1s2, RRR, Soft systolic murmur+ Abdomen - Soft, Non tender , non distended Ext- no pedal edema Neuro: grossly normal Back - WNL Psych : calm and cooperative  Recent labs CBC Latest Ref Rng & Units 08/22/2019 06/22/2019 04/12/2019  WBC 3.4 - 10.8 x10E3/uL 4.5 10.0 -  Hemoglobin 11.1 - 15.9 g/dL 13.3 14.1 11.5(L)  Hematocrit 34.0 - 46.6 % 40.3 44.8 35.0(L)  Platelets 150 - 400 K/uL - 208 -   CMP Latest Ref Rng & Units 11/27/2019 06/22/2019 04/12/2019  Glucose 65 - 99 mg/dL 111(H) 94 130(H)  BUN 7 - 25 mg/dL 14 15 8   Creatinine 0.60 - 0.93 mg/dL 1.24(H) 0.69 0.56  Sodium 135 - 146 mmol/L 137 142 141  Potassium 3.5 - 5.3 mmol/L 4.2 3.9 4.1  Chloride 98 - 110 mmol/L 104 106 107  CO2 20 - 32 mmol/L 26 28 24   Calcium 8.6 - 10.4 mg/dL 9.1 9.7 8.9  Total Protein 6.0 - 8.5 g/dL - - -  Total Bilirubin 0.0 - 1.2 mg/dL - - -  Alkaline Phos 39 - 117 IU/L - - -  AST 0 - 40 IU/L - - -  ALT 0 - 32 IU/L - - -    Pertinent Microbiology Results for orders placed or performed in visit on 10/25/19  MYCOBACTERIA, CULTURE, WITH FLUOROCHROME SMEAR     Status: None (Preliminary result)   Collection Time: 10/25/19  2:38 PM   Specimen: Sputum  Result Value Ref Range Status   MICRO NUMBER: [id number]  Preliminary   SPECIMEN QUALITY: Adequate  Preliminary   Source: SPUTUM  Preliminary  STATUS: PRELIMINARY  Preliminary   SMEAR: No acid fast bacilli seen.  Preliminary   RESULT:   Preliminary    Culture results to follow. Final reports of negative cultures can be expected in approximately six weeks. Positive cultures are reported immediately.    All pertinent labs/Imagings/notes reviewed. All pertinent plain films and CT images have been personally visualized and interpreted; radiology reports have been reviewed. Decision making incorporated into the  Impression / Recommendations.

## 2019-11-29 NOTE — Patient Instructions (Signed)
Take Bactrim DS 1 tab PO BID Drink plenty of fluids BMP this Friday F/u in 4 weeks

## 2019-11-30 ENCOUNTER — Other Ambulatory Visit (HOSPITAL_COMMUNITY): Payer: Self-pay | Admitting: Anesthesiology

## 2019-11-30 MED FILL — HYDROCODON-APAP 7.5-325: 7.5-325 | 25 days supply | Qty: 100 | Fill #0

## 2019-12-01 ENCOUNTER — Other Ambulatory Visit: Payer: Self-pay

## 2019-12-01 ENCOUNTER — Other Ambulatory Visit: Payer: Medicare PPO

## 2019-12-01 DIAGNOSIS — Z5181 Encounter for therapeutic drug level monitoring: Secondary | ICD-10-CM

## 2019-12-02 LAB — BASIC METABOLIC PANEL
BUN/Creatinine Ratio: 15 (calc) (ref 6–22)
BUN: 14 mg/dL (ref 7–25)
CO2: 26 mmol/L (ref 20–32)
Calcium: 9.2 mg/dL (ref 8.6–10.4)
Chloride: 103 mmol/L (ref 98–110)
Creat: 0.94 mg/dL — ABNORMAL HIGH (ref 0.60–0.93)
Glucose, Bld: 111 mg/dL — ABNORMAL HIGH (ref 65–99)
Potassium: 4.9 mmol/L (ref 3.5–5.3)
Sodium: 138 mmol/L (ref 135–146)

## 2019-12-04 ENCOUNTER — Telehealth: Payer: Self-pay | Admitting: *Deleted

## 2019-12-04 DIAGNOSIS — G4733 Obstructive sleep apnea (adult) (pediatric): Secondary | ICD-10-CM | POA: Diagnosis not present

## 2019-12-04 DIAGNOSIS — J479 Bronchiectasis, uncomplicated: Secondary | ICD-10-CM | POA: Diagnosis not present

## 2019-12-04 DIAGNOSIS — Z5181 Encounter for therapeutic drug level monitoring: Secondary | ICD-10-CM

## 2019-12-04 DIAGNOSIS — J310 Chronic rhinitis: Secondary | ICD-10-CM | POA: Diagnosis not present

## 2019-12-04 MED FILL — PARoxetine HCL 40 MG TABS: 40 | 90 days supply | Qty: 135 | Fill #1

## 2019-12-04 NOTE — Telephone Encounter (Signed)
Spoke with Langley Gauss, scheduled lab visit for 11/2.  Order placed.

## 2019-12-04 NOTE — Telephone Encounter (Signed)
Relayed to [place].  She has no nausea for the past few days, has not needed zofran.  She would like to recheck labs before her next appointment.  Scheduled lab visit 11/22, BMP ordered. Landis Gandy, RN

## 2019-12-04 NOTE — Telephone Encounter (Signed)
I agree with getting a BMPO again. 11/22 seems too far out. I would prefer her to have next BMP in 2 weeks or so.

## 2019-12-04 NOTE — Addendum Note (Signed)
Addended by: Landis Gandy on: 12/04/2019 02:49 PM   Modules accepted: Orders

## 2019-12-04 NOTE — Telephone Encounter (Signed)
-----   Message from Rosiland Oz, MD sent at 12/04/2019  8:37 AM EDT ----- Regarding: LABS Please let her know that her Creatinine from last blood work was normal. Continue taking Bactrim 1 DS tabs PO BID for now.

## 2019-12-06 ENCOUNTER — Other Ambulatory Visit (HOSPITAL_COMMUNITY): Payer: Self-pay | Admitting: Anesthesiology

## 2019-12-06 DIAGNOSIS — M47816 Spondylosis without myelopathy or radiculopathy, lumbar region: Secondary | ICD-10-CM | POA: Diagnosis not present

## 2019-12-06 DIAGNOSIS — G894 Chronic pain syndrome: Secondary | ICD-10-CM | POA: Diagnosis not present

## 2019-12-06 DIAGNOSIS — M150 Primary generalized (osteo)arthritis: Secondary | ICD-10-CM | POA: Diagnosis not present

## 2019-12-06 DIAGNOSIS — Z79891 Long term (current) use of opiate analgesic: Secondary | ICD-10-CM | POA: Diagnosis not present

## 2019-12-06 MED FILL — BELBUCA 300 MCG FILM: 300 | 30 days supply | Qty: 60 | Fill #0

## 2019-12-07 ENCOUNTER — Other Ambulatory Visit: Payer: Self-pay | Admitting: Internal Medicine

## 2019-12-07 ENCOUNTER — Other Ambulatory Visit: Payer: Self-pay | Admitting: Allergy and Immunology

## 2019-12-08 ENCOUNTER — Other Ambulatory Visit: Payer: Self-pay | Admitting: Internal Medicine

## 2019-12-08 ENCOUNTER — Other Ambulatory Visit: Payer: Self-pay | Admitting: Allergy and Immunology

## 2019-12-08 ENCOUNTER — Other Ambulatory Visit: Payer: Self-pay

## 2019-12-08 LAB — MYCOBACTERIA,CULT W/FLUOROCHROME SMEAR
MICRO NUMBER:: 10928783
SMEAR:: NONE SEEN
SPECIMEN QUALITY:: ADEQUATE

## 2019-12-08 MED ORDER — FAMOTIDINE 40 MG PO TABS
40.0000 mg | ORAL_TABLET | Freq: Every day | ORAL | 0 refills | Status: DC
Start: 2019-12-08 — End: 2020-03-05

## 2019-12-08 MED ORDER — FAMOTIDINE 40 MG PO TABS
40.0000 mg | ORAL_TABLET | Freq: Every day | ORAL | 0 refills | Status: DC
Start: 2019-12-08 — End: 2020-01-25

## 2019-12-08 MED FILL — FAMOTIDINE 40 MG TABS: 40 | 90 days supply | Qty: 90 | Fill #0

## 2019-12-09 ENCOUNTER — Other Ambulatory Visit: Payer: Self-pay | Admitting: Internal Medicine

## 2019-12-09 MED FILL — OMEGA-3-ACID ETHYL ESTERS 1: 1 | 45 days supply | Qty: 180 | Fill #0

## 2019-12-11 ENCOUNTER — Telehealth: Payer: Self-pay | Admitting: Allergy and Immunology

## 2019-12-11 NOTE — Telephone Encounter (Signed)
Dr. Neldon Mc please advise on when you would like to see this patient back in the office.

## 2019-12-11 NOTE — Telephone Encounter (Signed)
She can come and see me after 3 months of immunoglobulin infusion

## 2019-12-11 NOTE — Telephone Encounter (Signed)
Informed patient of Dr. Bruna Potter recommendation and she will make an appointment in December.

## 2019-12-11 NOTE — Telephone Encounter (Signed)
Patient called to see when she needs to come in since she started the hypogammaglobulinemia shots, she has been on them since sept. [phone]

## 2019-12-19 ENCOUNTER — Other Ambulatory Visit: Payer: Self-pay

## 2019-12-19 ENCOUNTER — Other Ambulatory Visit: Payer: Medicare PPO

## 2019-12-19 DIAGNOSIS — Z5181 Encounter for therapeutic drug level monitoring: Secondary | ICD-10-CM | POA: Diagnosis not present

## 2019-12-20 ENCOUNTER — Telehealth: Payer: Self-pay

## 2019-12-20 LAB — BASIC METABOLIC PANEL
BUN/Creatinine Ratio: 24 (calc) — ABNORMAL HIGH (ref 6–22)
BUN: 13 mg/dL (ref 7–25)
CO2: 29 mmol/L (ref 20–32)
Calcium: 9 mg/dL (ref 8.6–10.4)
Chloride: 108 mmol/L (ref 98–110)
Creat: 0.55 mg/dL — ABNORMAL LOW (ref 0.60–0.93)
Glucose, Bld: 90 mg/dL (ref 65–99)
Potassium: 4.8 mmol/L (ref 3.5–5.3)
Sodium: 140 mmol/L (ref 135–146)

## 2019-12-20 NOTE — Telephone Encounter (Signed)
Call placed to patient and made her aware of recent lab results and continue Bactrim as ordered. Patient also accepted rescheduled lab appointment.  Beverly [name]

## 2019-12-20 NOTE — Progress Notes (Signed)
Please let patient know that her CR is normal now. Continue taking Bactrim 1DS tab PO BID. Please have her BMP rechecked on Nov 12 th instead of Nov 24th.

## 2019-12-20 NOTE — Telephone Encounter (Signed)
-----   Message from Rosiland Oz, MD sent at 12/20/2019  7:07 AM EDT ----- Please let patient know that her CR is normal now. Continue taking Bactrim 1DS tab PO BID. Please have her BMP rechecked on Nov 12 th instead of Nov 24th.

## 2019-12-20 NOTE — Telephone Encounter (Signed)
Thanks

## 2019-12-21 MED FILL — MONTELUKAST SOD 10 MG TAB: 10 | 90 days supply | Qty: 90 | Fill #1

## 2019-12-22 ENCOUNTER — Telehealth: Payer: Self-pay | Admitting: *Deleted

## 2019-12-22 DIAGNOSIS — Z7982 Long term (current) use of aspirin: Secondary | ICD-10-CM | POA: Diagnosis not present

## 2019-12-22 DIAGNOSIS — Z96649 Presence of unspecified artificial hip joint: Secondary | ICD-10-CM | POA: Diagnosis not present

## 2019-12-22 DIAGNOSIS — F325 Major depressive disorder, single episode, in full remission: Secondary | ICD-10-CM | POA: Diagnosis not present

## 2019-12-22 DIAGNOSIS — A430 Pulmonary nocardiosis: Secondary | ICD-10-CM | POA: Diagnosis not present

## 2019-12-22 DIAGNOSIS — Z6825 Body mass index (BMI) 25.0-25.9, adult: Secondary | ICD-10-CM | POA: Diagnosis not present

## 2019-12-22 DIAGNOSIS — E785 Hyperlipidemia, unspecified: Secondary | ICD-10-CM | POA: Diagnosis not present

## 2019-12-22 DIAGNOSIS — R059 Cough, unspecified: Secondary | ICD-10-CM | POA: Diagnosis not present

## 2019-12-22 DIAGNOSIS — J45909 Unspecified asthma, uncomplicated: Secondary | ICD-10-CM | POA: Diagnosis not present

## 2019-12-22 DIAGNOSIS — G4733 Obstructive sleep apnea (adult) (pediatric): Secondary | ICD-10-CM | POA: Diagnosis not present

## 2019-12-22 DIAGNOSIS — M159 Polyosteoarthritis, unspecified: Secondary | ICD-10-CM

## 2019-12-22 DIAGNOSIS — D8489 Other immunodeficiencies: Secondary | ICD-10-CM | POA: Diagnosis not present

## 2019-12-22 DIAGNOSIS — Z791 Long term (current) use of non-steroidal anti-inflammatories (NSAID): Secondary | ICD-10-CM | POA: Diagnosis not present

## 2019-12-22 DIAGNOSIS — M1990 Unspecified osteoarthritis, unspecified site: Secondary | ICD-10-CM | POA: Diagnosis not present

## 2019-12-22 DIAGNOSIS — E663 Overweight: Secondary | ICD-10-CM | POA: Diagnosis not present

## 2019-12-22 DIAGNOSIS — M85852 Other specified disorders of bone density and structure, left thigh: Secondary | ICD-10-CM

## 2019-12-22 DIAGNOSIS — F419 Anxiety disorder, unspecified: Secondary | ICD-10-CM | POA: Diagnosis not present

## 2019-12-22 DIAGNOSIS — Z79891 Long term (current) use of opiate analgesic: Secondary | ICD-10-CM | POA: Diagnosis not present

## 2019-12-22 DIAGNOSIS — G8929 Other chronic pain: Secondary | ICD-10-CM | POA: Diagnosis not present

## 2019-12-22 NOTE — Addendum Note (Signed)
Addended by: Westley Hummer B on: 12/22/2019 04:17 PM   Modules accepted: Orders

## 2019-12-22 NOTE — Telephone Encounter (Signed)
Referral placed.

## 2019-12-22 NOTE — Telephone Encounter (Signed)
Patient called requesting an order for a bone density test. Please advise

## 2019-12-29 ENCOUNTER — Other Ambulatory Visit: Payer: Self-pay

## 2019-12-29 ENCOUNTER — Other Ambulatory Visit: Payer: Medicare PPO

## 2019-12-29 DIAGNOSIS — Z0189 Encounter for other specified special examinations: Secondary | ICD-10-CM | POA: Diagnosis not present

## 2019-12-29 DIAGNOSIS — Z5181 Encounter for therapeutic drug level monitoring: Secondary | ICD-10-CM

## 2019-12-30 LAB — BASIC METABOLIC PANEL
BUN: 11 mg/dL (ref 7–25)
CO2: 27 mmol/L (ref 20–32)
Calcium: 9.1 mg/dL (ref 8.6–10.4)
Chloride: 107 mmol/L (ref 98–110)
Creat: 0.75 mg/dL (ref 0.60–0.93)
Glucose, Bld: 105 mg/dL — ABNORMAL HIGH (ref 65–99)
Potassium: 4.3 mmol/L (ref 3.5–5.3)
Sodium: 140 mmol/L (ref 135–146)

## 2020-01-03 DIAGNOSIS — M150 Primary generalized (osteo)arthritis: Secondary | ICD-10-CM | POA: Diagnosis not present

## 2020-01-03 DIAGNOSIS — M47816 Spondylosis without myelopathy or radiculopathy, lumbar region: Secondary | ICD-10-CM | POA: Diagnosis not present

## 2020-01-03 DIAGNOSIS — Z79891 Long term (current) use of opiate analgesic: Secondary | ICD-10-CM | POA: Diagnosis not present

## 2020-01-03 DIAGNOSIS — G894 Chronic pain syndrome: Secondary | ICD-10-CM | POA: Diagnosis not present

## 2020-01-03 MED FILL — BELBUCA 450 MCG FILM: 450 | 30 days supply | Qty: 60 | Fill #0

## 2020-01-04 DIAGNOSIS — G4733 Obstructive sleep apnea (adult) (pediatric): Secondary | ICD-10-CM | POA: Diagnosis not present

## 2020-01-04 DIAGNOSIS — J310 Chronic rhinitis: Secondary | ICD-10-CM | POA: Diagnosis not present

## 2020-01-04 DIAGNOSIS — J479 Bronchiectasis, uncomplicated: Secondary | ICD-10-CM | POA: Diagnosis not present

## 2020-01-08 ENCOUNTER — Other Ambulatory Visit: Payer: Medicare PPO

## 2020-01-10 ENCOUNTER — Other Ambulatory Visit: Payer: Self-pay

## 2020-01-10 ENCOUNTER — Ambulatory Visit: Payer: Medicare PPO | Admitting: Infectious Diseases

## 2020-01-10 ENCOUNTER — Encounter: Payer: Self-pay | Admitting: Infectious Diseases

## 2020-01-10 ENCOUNTER — Other Ambulatory Visit: Payer: Self-pay | Admitting: *Deleted

## 2020-01-10 VITALS — BP 111/66 | HR 68 | Temp 97.9°F | Ht 65.0 in | Wt 153.0 lb

## 2020-01-10 DIAGNOSIS — A439 Nocardiosis, unspecified: Secondary | ICD-10-CM | POA: Diagnosis not present

## 2020-01-10 DIAGNOSIS — Z5181 Encounter for therapeutic drug level monitoring: Secondary | ICD-10-CM | POA: Diagnosis not present

## 2020-01-10 MED FILL — ESOMEPRAZOLE MAG DR 40 MG C: 40 | 90 days supply | Qty: 90 | Fill #0

## 2020-01-10 MED FILL — SULFAMETHOXAZOLE-TMP DS TAB: 800-160 | 15 days supply | Qty: 90 | Fill #3

## 2020-01-10 NOTE — Progress Notes (Signed)
[facility]                                                             [address], [place], [place], [address]                                                                  Phn. [phone]; Fax: [phone]                                                                             Date: 01/10/2020  Reason for Follow Up: Nocardia Pnuemonia  Assessment/Plan Beverly [name] is a 74 year old female with a history of mild persistent asthma, GERD, Bronchiectasis, OSA, Tracheomalacia, Primary immunodeficiency on immunoglobulin therapy who was recently diagnosed with Nocardia nova PNA with positive sputum cx  on 07/21/19 and was started on Bactrim therapy ( 15mg /kg/day) from 10/25/19. She initially had nausea/vomiting with bactrim which was managed with Zofran prn. However, she has developed an AKI with Cr of 1.24 from 0.69. The dosing of Bactrim was reduced to Bactrim 1 DS tab po BID in last clinic visit on 11/29/19 and her Cr seems to have been WNL since then.  Her primary complaint has been cough. She has no difficulty breathing walking for approx 45 mins. Her cough seems to be multifactorial- GERD, LPR, mild persistent asthma including Nocardia PNA. She has been following up with Pulmonary. She has been using hypertonic saline neubuliser and flutter valve. Has been on Esomeprazole and Famotidine for GERD. Oral appliance for OSA. She is also getting monthly injections with immunoglobulins for primary hypogammaglobulinemia.  Her cough and SOB has significantly improved. Continue Bactrim as is  Follow up for management of GERD/Asthma/LPR and Hypogammaglobulinemia per Pulmonary and Immunology   CBC and BMP  in 2 weeks   Follow up in 4 weeks   I spent greater than 25  minutes with the patient including greater than 50% of time in face to face counsel of the patient and in coordination of their  care.   Rosiland Oz, MD [facility]  Office phone [phone] Fax no. [phone] ______________________________________________________________________________________________________________________ Subjective  Beverly [name] is a 74 year old female with a history of mild persistent asthma, GERD, Bronchiectasis, OSA, Tracheomalacia, Primary immunodeficiency on immunoglobulin therapy who was recently diagnosed with Nocardia nova PNA on 07/21/19 and was started on Bactrim therapy ( 15mg /kg/day) from 10/25/19. She initially had nausea/vomiting with bactrim which was managed with Zofran prn. However, she has developed an AKI with Cr of 1.24 from 0.69. The dosing of Bactrim was reduced to Bactrim 1 DS tab po BID in last clinic visit on 11/29/19 and her Cr seems to have been WNL since then.   She says her cough has  significantly improved, would say it is 6/10 from 10/10. However, continues to produce phlegm which has a lot of mucus and is stringy. She also has mucus plugs that she coughs Up. SOB has significantly improved. She is able to talk for a long time, several hours awithout getting SOB which she was not able to do before. Denies any fever, chills, sweats, nausea, vomiting, diuarrhea or rashes.   She has been following with her PCP routinely and Pulmonary for management of OSA/bronchiectasis/asthma/GERD. She is getting weekly Immunoglobulin injections by her Allergist. She says she has developed bone pain every time she gets the immunoglobulin injection and will discuss about it with her Immunologist.   ROS: 10 point ROS negative except as above   Past Medical History:  Diagnosis Date  . Allergy   . Amaurosis fugax    negative w/u through [facility]  . Asthma   . Back pain    Dr Joline Salt 02/2010-epidural injection x 2 at L4-5 with good effect  . 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 BCC (basal cell carcinoma of skin) 05/09/2014   mid lower  back  . BCC (basal cell carcinoma of skin) 05/03/2017   right low back  . BCC (basal cell carcinoma of skin) 05/03/2017   left upper back  . BCC (basal cell carcinoma of skin) 07/05/2018   left mid back  . BCC (basal cell carcinoma of skin) 05/20/1992   upper back  . BCC (basal cell carcinoma of skin) 07/29/1993   left sholder medial  . BCC (basal cell carcinoma of skin) 07/29/1993   left sholder lateral  . BCC (basal cell carcinoma of skin) 07/29/1993   right thigh  . BCC (basal cell carcinoma of skin) 07/29/1993   right sholder  . BCC (basal cell carcinoma of skin) 12/22/1994   right mid forearm  . BCC (basal cell carcinoma of skin) 12/22/1994   right upper forearm  . BCC (basal cell carcinoma of skin) 12/22/1994   lower right upper arm  . BCC (basal cell carcinoma of skin) 12/22/1994   right upper arm sholder  . BCC (basal cell carcinoma of skin) 08/11/1995   left leg below knee  . BCC (basal cell carcinoma of skin) 04/11/2002   mid back  . BCC (basal cell carcinoma of skin) 12/10/2002   right center upper back  . BCC (basal cell carcinoma of skin) 05/26/2005   right post sholder  . BCC (basal cell carcinoma) 05/09/2014   left inner shin  . BCC (basal cell carcinoma) 06/12/2014   left forearm  . Bowen's disease 10/07/1994   right post knee, right inner forearm/wrist  . Bowen's disease 08/11/1995   left sholder  . Cataract    left  . Chronic pain   . Depression   . Diverticulosis of colon 1998   mild  . DJD (degenerative joint disease)   . Duodenal ulcer    h/o  . GERD (gastroesophageal reflux disease)   . History of SCC (squamous cell carcinoma) of skin    Dr. Denna Haggard  . History of sinus bradycardia   . HLD (hyperlipidemia)    hypertriglyceridemia  . Hypertensive retinopathy of both eyes 11/18/2017  . IBS (irritable bowel syndrome)   . Internal hemorrhoids 1998  . Mitral regurgitation    mild  . Osteoarthritis    feet,shoulder,neck,back,hips and hands.   . Pneumonia   . PONV (postoperative nausea and vomiting)   . Rotator cuff tear, right 02/2019   infraspinatus  and supraspinatus, and dislocation of long head of bicep tendon (Dr. Alvan Dame)  . SCC (squamous cell carcinoma) 11/26/2014   left hand, right hand, right deltoid  . SCC (squamous cell carcinoma) 05/03/2017   left cheek  . Sleep apnea    uses a mouth guard nightly  . Squamous cell carcinoma in situ (SCCIS) 07/05/2018   left hand  . Vitamin D deficiency    mild   Past Surgical History:  Procedure Laterality Date  . ABDOMINAL HYSTERECTOMY    . BLEPHAROPLASTY Bilateral 01/2018  . BROW LIFT Bilateral 10/10/2018   Procedure: BROW LIFT;  Surgeon: Wallace Going, DO;  Location: [facility];  Service: Plastics;  Laterality: Bilateral;  . BUNIONECTOMY     R 12/08, L 2004 (Dr. Janus Molder)  . CARPAL TUNNEL RELEASE  1989   bilateral  . CATARACT EXTRACTION Bilateral Left in 08/2012, Right 04/2016   Dr.Hecker  . CESAREAN SECTION     x2  . CHOLECYSTECTOMY  1992  . CLOSED REDUCTION NASAL FRACTURE N/A 08/29/2018   Procedure: CLOSED REDUCTION NASAL FRACTURE;  Surgeon: Wallace Going, DO;  Location: [facility];  Service: Plastics;  Laterality: N/A;  1 hour, please  . COLONOSCOPY  2006  . COLONOSCOPY  01/2012   due again 01/2022; mild diverticulosis  . epidural steroid injection, back  02/2010  . HIP SURGERY     right bursectomy x 3  . HIP SURGERY Right 2000   torn cartilage, repaired  . INGUINAL HERNIA REPAIR  9/09   bilat  . NECK SURGERY  1989   c6-7 cervical laminectomy and diskecotmy  . NECK SURGERY  1989   per patient posterior area of neck  . REVERSE SHOULDER ARTHROPLASTY Right 04/11/2019   Procedure: REVERSE SHOULDER ARTHROPLASTY;  Surgeon: Nicholes Stairs, MD;  Location: [facility];  Service: Orthopedics;  Laterality: Right;  [facility]  . SHOULDER SURGERY  2/05   left rotator cuff repair  . SHOULDER SURGERY Left 03/14/2018   rotator  cuff repair; Dr. Tonita Cong  . TONSILLECTOMY  age 54  . TOTAL ABDOMINAL HYSTERECTOMY W/ BILATERAL SALPINGOOPHORECTOMY  1997   fibroids  . TROCHANTERIC BURSA EXCISION Right 1978  . TUBAL LIGATION  1986  . WISDOM TOOTH EXTRACTION     Current Outpatient Medications on File Prior to Visit  Medication Sig Dispense Refill  . acetaminophen (TYLENOL) 500 MG tablet Take 1,000 mg by mouth every 6 (six) hours as needed for moderate pain.     Marland Kitchen albuterol (PROVENTIL HFA;VENTOLIN HFA) 108 (90 Base) MCG/ACT inhaler Inhale 2 puffs into the lungs every 6 (six) hours as needed for wheezing or shortness of breath. 18 g 1  . amoxicillin (AMOXIL) 500 MG tablet Take 2,000 mg by mouth as directed. For dental appointments    . aspirin EC 81 MG tablet Take 81 mg by mouth daily.    Marland Kitchen atorvastatin (LIPITOR) 40 MG tablet Take 1 tablet (40 mg total) by mouth daily. 90 tablet 1  . BELBUCA 450 MCG FILM     . Calcium Carbonate-Vitamin D (CALCIUM 600+D) 600-200 MG-UNIT TABS Take 1 tablet by mouth 2 (two) times daily.      . celecoxib (CELEBREX) 200 MG capsule TAKE 1 CAPSULE BY MOUTH TWICE DAILY 180 capsule 1  . diclofenac Sodium (VOLTAREN) 1 % GEL Apply 2 g topically 4 (four) times daily as needed (joint pain).     Marland Kitchen esomeprazole (NEXIUM) 40 MG capsule TAKE 1 CAPSULE BY MOUTH DAILY IN  THE MORNING 90 capsule 0  . famotidine (PEPCID) 40 MG tablet Take 1 tablet (40 mg total) by mouth at bedtime. 90 tablet 0  . Fluorouracil (TOLAK) 4 % CREA Apply 1 application topically every 3 (three) days. Dr Denna Haggard    . gabapentin (NEURONTIN) 800 MG tablet Take 800 mg by mouth at bedtime.     . GUAIFENESIN ER PO Take 400 mg by mouth 2 (two) times daily.     Marland Kitchen HIZENTRA 10 GM/50ML SOLN     . HYDROcodone-acetaminophen (NORCO) 7.5-325 MG tablet Take 1 tablet by mouth every 12 (twelve) hours as needed for moderate pain. Dr Hardin Negus    . loperamide (IMODIUM A-D) 2 MG tablet Take 2 mg by mouth 4 (four) times daily as needed for diarrhea or loose  stools.    . methocarbamol (ROBAXIN) 500 MG tablet Take 500 mg by mouth in the morning and at bedtime.     . montelukast (SINGULAIR) 10 MG tablet Take 1 tablet (10 mg total) by mouth at bedtime. 90 tablet 1  . Multiple Vitamins-Minerals (MULTIVITAMIN WITH MINERALS) tablet Take 1 tablet by mouth daily.    . niacinamide 500 MG tablet Take 500 mg by mouth 2 (two) times daily with a meal.    . omega-3 acid ethyl esters (LOVAZA) 1 g capsule TAKE 2 CAPSULES BY MOUTH TWO TIMES DAILY * NEEDS OFFICE VISIT FOR MORE REFILLS 180 capsule 0  . PARoxetine (PAXIL) 30 MG tablet Take 60 mg by mouth daily.     Marland Kitchen Respiratory Therapy Supplies (FLUTTER) DEVI 1 puff by Does not apply route 2 (two) times daily. 1 each 0  . sodium chloride HYPERTONIC 3 % nebulizer solution Take by nebulization in the morning and at bedtime. 750 mL 12  . sulfamethoxazole-trimethoprim (BACTRIM DS) 800-160 MG tablet Take 2 tablets by mouth in the morning, at noon, and at bedtime. (Patient taking differently: Take 1 tablet by mouth 2 (two) times daily. ) 90 tablet 3  . BELBUCA 150 MCG FILM     . famotidine (PEPCID) 40 MG tablet Take 1 tablet (40 mg total) by mouth daily. 90 tablet 0  . ondansetron (ZOFRAN) 4 MG tablet Take 1 tablet (4 mg total) by mouth every 8 (eight) hours as needed for nausea or vomiting. (Patient not taking: Reported on 01/10/2020) 60 tablet 2   No current facility-administered medications on file prior to visit.   Allergies  Allergen Reactions  . Adhesive [Tape] Rash  . Codeine Rash   Social History   Socioeconomic History  . Marital status: Divorced    Spouse name: Not on file  . Number of children: 2  . Years of education: Not on file  . Highest education level: Not on file  Occupational History  . Occupation: STAFF Optician, dispensing: [employer]  Tobacco Use  . Smoking status: Never Smoker  . Smokeless tobacco: Never Used  Vaping Use  . Vaping Use: Never used  Substance and Sexual  Activity  . Alcohol use: Yes    Alcohol/week: 7.0 standard drinks    Types: 7 Glasses of wine per week    Comment: 1 glass of wine daily  . Drug use: No  . Sexual activity: Not Currently  Other Topics Concern  . 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   2020--worked for [employer] part-time   Social Determinants of Health   Financial Resource Strain:   . Difficulty of Paying Living Expenses: Not on file  Food Insecurity:   . Worried About Charity fundraiser in the Last Year: Not on file  . Ran Out of Food in the Last Year: Not on file  Transportation Needs:   . Lack of Transportation (Medical): Not on file  . Lack of Transportation (Non-Medical): Not on file  Physical Activity:   . Days of Exercise per Week: Not on file  . Minutes of Exercise per Session: Not on file  Stress:   . Feeling of Stress : Not on file  Social Connections:   . Frequency of Communication with Friends and Family: Not on file  . Frequency of Social Gatherings with Friends and Family: Not on file  . Attends Religious Services: Not on file  . Active Member of Clubs or Organizations: Not on file  . Attends Archivist Meetings: Not on file  . Marital Status: Not on file  Intimate Partner Violence:   . Fear of Current or Ex-Partner: Not on file  . Emotionally Abused: Not on file  . Physically Abused: Not on file  . Sexually Abused: Not on file     Vitals BP 111/66   Pulse 68   Temp 97.9 F (36.6 C) (Oral)   Ht 5\' 5"  (1.651 m)   Wt 153 lb (69.4 kg)   SpO2 98%   BMI 25.46 kg/m    Examination  General - not in acute distress, comfortably sitting in chair HEENT - PEERLA, no pallor and no icterus Chest - BILATERAL AIR ENTRY, MOSTLY CLEAR WITH OCCASIONAL RALES  CVS- Normal s1s2, RRR, Soft  systolic murmur+ Abdomen - Soft, Non tender , non distended Ext- no pedal edema Neuro: grossly normal Back - WNL Psych : calm and cooperative  Recent labs CBC Latest Ref Rng & Units 08/22/2019 06/22/2019 04/12/2019  WBC 3.4 - 10.8 x10E3/uL 4.5 10.0 -  Hemoglobin 11.1 - 15.9 g/dL 13.3 14.1 11.5(L)  Hematocrit 34.0 - 46.6 % 40.3 44.8 35.0(L)  Platelets 150 - 400 K/uL - 208 -   CMP Latest Ref Rng & Units 01/10/2020 12/29/2019 12/19/2019  Glucose 65 - 99 mg/dL 68 105(H) 90  BUN 7 - 25 mg/dL 13 11 13   Creatinine 0.60 - 0.93 mg/dL 0.85 0.75 0.55(L)  Sodium 135 - 146 mmol/L 140 140 140  Potassium 3.5 - 5.3 mmol/L 4.3 4.3 4.8  Chloride 98 - 110 mmol/L 106 107 108  CO2 20 - 32 mmol/L 28 27 29   Calcium 8.6 - 10.4 mg/dL 9.3 9.1 9.0  Total Protein 6.0 - 8.5 g/dL - - -  Total Bilirubin 0.0 - 1.2 mg/dL - - -  Alkaline Phos 39 - 117 IU/L - - -  AST 0 - 40 IU/L - - -  ALT 0 - 32 IU/L - - -     Pertinent Microbiology Results for orders placed or performed in visit on 11/29/19   MYCOBACTERIA, CULTURE, WITH FLUOROCHROME SMEAR     Status: Abnormal (Preliminary result)   Collection Time: 11/29/19 11:03 AM   Specimen: Sputum  Result Value Ref Range Status   MICRO NUMBER: [id number]  Preliminary   SPECIMEN QUALITY: Adequate  Preliminary   Source: SPUTUM  Preliminary   STATUS: PRELIMINARY  Preliminary   SMEAR: (A)  Preliminary    Rare (1 +) acid-fast bacilli seen using the fluorochrome method.  RESULT:   Preliminary    Culture results to follow. Final reports of negative cultures can be expected in approximately six weeks. Positive cultures are reported immediately.     All pertinent labs/Imagings/notes reviewed. All pertinent plain films and CT images have been personally visualized and interpreted; radiology reports have been reviewed. Decision making incorporated into the Impression / Recommendations.

## 2020-01-11 DIAGNOSIS — A439 Nocardiosis, unspecified: Secondary | ICD-10-CM | POA: Insufficient documentation

## 2020-01-11 DIAGNOSIS — Z5181 Encounter for therapeutic drug level monitoring: Secondary | ICD-10-CM | POA: Insufficient documentation

## 2020-01-11 LAB — BASIC METABOLIC PANEL
BUN: 13 mg/dL (ref 7–25)
CO2: 28 mmol/L (ref 20–32)
Calcium: 9.3 mg/dL (ref 8.6–10.4)
Chloride: 106 mmol/L (ref 98–110)
Creat: 0.85 mg/dL (ref 0.60–0.93)
Glucose, Bld: 68 mg/dL (ref 65–99)
Potassium: 4.3 mmol/L (ref 3.5–5.3)
Sodium: 140 mmol/L (ref 135–146)

## 2020-01-11 NOTE — Addendum Note (Signed)
Addended by: Rosiland Oz on: 01/11/2020 08:21 AM   Modules accepted: Orders

## 2020-01-11 NOTE — Assessment & Plan Note (Signed)
Continue Bactrim FU in 1 month

## 2020-01-11 NOTE — Assessment & Plan Note (Signed)
BMP today  CBC and BMP in 2 weeks

## 2020-01-14 DIAGNOSIS — Z20822 Contact with and (suspected) exposure to covid-19: Secondary | ICD-10-CM | POA: Diagnosis not present

## 2020-01-15 ENCOUNTER — Encounter: Payer: Self-pay | Admitting: Internal Medicine

## 2020-01-15 DIAGNOSIS — Z13820 Encounter for screening for osteoporosis: Secondary | ICD-10-CM | POA: Diagnosis not present

## 2020-01-15 DIAGNOSIS — N644 Mastodynia: Secondary | ICD-10-CM | POA: Diagnosis not present

## 2020-01-15 DIAGNOSIS — M810 Age-related osteoporosis without current pathological fracture: Secondary | ICD-10-CM | POA: Diagnosis not present

## 2020-01-15 LAB — HM MAMMOGRAPHY

## 2020-01-15 LAB — HM DEXA SCAN: HM Dexa Scan: -1.4

## 2020-01-19 ENCOUNTER — Other Ambulatory Visit: Payer: Self-pay | Admitting: Internal Medicine

## 2020-01-19 MED FILL — OMEGA-3-ACID ETHYL ESTERS 1: 1 | 45 days supply | Qty: 180 | Fill #0

## 2020-01-23 ENCOUNTER — Other Ambulatory Visit: Payer: Self-pay

## 2020-01-23 ENCOUNTER — Ambulatory Visit: Payer: Medicare PPO | Admitting: Allergy and Immunology

## 2020-01-23 DIAGNOSIS — A439 Nocardiosis, unspecified: Secondary | ICD-10-CM

## 2020-01-23 DIAGNOSIS — J479 Bronchiectasis, uncomplicated: Secondary | ICD-10-CM

## 2020-01-23 DIAGNOSIS — D801 Nonfamilial hypogammaglobulinemia: Secondary | ICD-10-CM

## 2020-01-23 DIAGNOSIS — K219 Gastro-esophageal reflux disease without esophagitis: Secondary | ICD-10-CM | POA: Diagnosis not present

## 2020-01-23 NOTE — Patient Instructions (Addendum)
  1.  Continue immunoglobulin infusions  2.  Continue to treat reflex / LPR:   A. Minimize caffeine consumption  B. Nexium 40 mg in AM  C. Famotidine 40 mg in PM  3.  Treat and prevent inflammation of upper airway:   A.  Montelukast 10 mg -1 tablet 1 time per day  B.  OTC Rhinocort/Nasacort -1 spray each nostril 1 time per day  4.  Blood - IgA/G/M  5.  Return to clinic in 12 weeks or earlier if problem

## 2020-01-23 NOTE — Progress Notes (Signed)
[place]   Follow-up Note  Referring Provider: Isaac Bliss, [facility]* Primary Provider: Isaac Bliss, Rayford Halsted, MD Date of Office Visit: 01/23/2020  Subjective:   Beverly [name] (DOB: Aug 21, 1945) is a 74 y.o. female who returns to the [facility] on 01/23/2020 in re-evaluation of the following:  HPI: Beverly [name] presents to this clinic in evaluation of hypogammaglobulinemia/CVID and a history of bronchiectasis and recent nocardia pneumonia.  I last saw her in this clinic on 22 August 2019.  She has been utilizing immunoglobulin infusions every week for the past 8 weeks without any adverse effect.  Apparently nocardia was cultured from her sputum sample in August 2021 and she has been on Bactrim with a plan to utilize this agent for full 6 months.  She has been utilizing this agent for 8 weeks.  She has been having some postnasal drip that is sometimes yellow and stringy without any anosmia or decreased ability to taste.  She did have a CT scan of her sinuses performed 18 February 2018 which did not identify any significant amount of sinus disease.  When she was last seen in this clinic she did have a history consistent with reflux induced respiratory disease and we placed her on a collection of antireflux medicines and she is much better regarding her cough and drainage in her throat and throat clearing.  She does not have any indigestion.  She has consolidated her caffeine consumption considerably.  She has received 3 Pfizer Covid vaccines and one flu vaccine.  Allergies as of 01/23/2020      Reactions   Adhesive [tape] Rash   Codeine Rash      Medication List      acetaminophen 500 MG tablet Commonly known as: TYLENOL Take 1,000 mg by mouth every 6 (six) hours as needed for moderate pain.   albuterol 108 (90 Base) MCG/ACT inhaler Commonly known as: VENTOLIN HFA Inhale 2 puffs into the lungs every 6 (six) hours  as needed for wheezing or shortness of breath.   amoxicillin 500 MG tablet Commonly known as: AMOXIL Take 2,000 mg by mouth as directed. For dental appointments   aspirin EC 81 MG tablet Take 81 mg by mouth daily.   atorvastatin 40 MG tablet Commonly known as: LIPITOR Take 1 tablet (40 mg total) by mouth daily.   Belbuca 450 MCG Film Generic drug: Buprenorphine HCl   Calcium 600+D 600-200 MG-UNIT Tabs Generic drug: Calcium Carbonate-Vitamin D Take 1 tablet by mouth 2 (two) times daily.   celecoxib 200 MG capsule Commonly known as: CELEBREX TAKE 1 CAPSULE BY MOUTH TWICE DAILY   diclofenac Sodium 1 % Gel Commonly known as: VOLTAREN Apply 2 g topically 4 (four) times daily as needed (joint pain).   esomeprazole 40 MG capsule Commonly known as: NEXIUM TAKE 1 CAPSULE BY MOUTH DAILY IN THE MORNING   famotidine 40 MG tablet Commonly known as: PEPCID Take 1 tablet (40 mg total) by mouth at bedtime.   famotidine 40 MG tablet Commonly known as: PEPCID Take 1 tablet (40 mg total) by mouth daily.   Flutter Devi 1 puff by Does not apply route 2 (two) times daily.   GUAIFENESIN ER PO Take 400 mg by mouth 2 (two) times daily.   Hizentra 10 GM/50ML Soln Generic drug: Immune Globulin (Human)   HYDROcodone-acetaminophen 7.5-325 MG tablet Commonly known as: NORCO Take 1 tablet by mouth every 12 (twelve) hours as needed for moderate  pain. Dr Hardin Negus   loperamide 2 MG tablet Commonly known as: IMODIUM A-D Take 2 mg by mouth 4 (four) times daily as needed for diarrhea or loose stools.   methocarbamol 500 MG tablet Commonly known as: ROBAXIN Take 500 mg by mouth in the morning and at bedtime.   montelukast 10 MG tablet Commonly known as: SINGULAIR Take 1 tablet (10 mg total) by mouth at bedtime.   multivitamin with minerals tablet Take 1 tablet by mouth daily.   Neurontin 800 MG tablet Generic drug: gabapentin Take 800 mg by mouth at bedtime.   niacinamide 500 MG  tablet Take 500 mg by mouth 2 (two) times daily with a meal.   omega-3 acid ethyl esters 1 g capsule Commonly known as: LOVAZA TAKE 2 CAPSULES BY MOUTH TWO TIMES DAILY   ondansetron 4 MG tablet Commonly known as: Zofran Take 1 tablet (4 mg total) by mouth every 8 (eight) hours as needed for nausea or vomiting.   PARoxetine 30 MG tablet Commonly known as: PAXIL Take 60 mg by mouth daily.   sodium chloride HYPERTONIC 3 % nebulizer solution Take by nebulization in the morning and at bedtime.   sulfamethoxazole-trimethoprim 800-160 MG tablet Commonly known as: BACTRIM DS Take 2 tablets by mouth in the morning, at noon, and at bedtime.   [place] 4 % Crea Generic drug: Fluorouracil Apply 1 application topically every 3 (three) days. Dr Denna Haggard       Past Medical History:  Diagnosis Date  . Allergy   . Amaurosis fugax    negative w/u through [facility]  . Asthma   . Back pain    Dr Joline Salt 02/2010-epidural injection x 2 at L4-5 with good effect  . 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 BCC (basal cell carcinoma of skin) 05/09/2014   mid lower back  . BCC (basal cell carcinoma of skin) 05/03/2017   right low back  . BCC (basal cell carcinoma of skin) 05/03/2017   left upper back  . BCC (basal cell carcinoma of skin) 07/05/2018   left mid back  . BCC (basal cell carcinoma of skin) 05/20/1992   upper back  . BCC (basal cell carcinoma of skin) 07/29/1993   left sholder medial  . BCC (basal cell carcinoma of skin) 07/29/1993   left sholder lateral  . BCC (basal cell carcinoma of skin) 07/29/1993   right thigh  . BCC (basal cell carcinoma of skin) 07/29/1993   right sholder  . BCC (basal cell carcinoma of skin) 12/22/1994   right mid forearm  . BCC (basal cell carcinoma of skin) 12/22/1994   right upper forearm  . BCC (basal cell carcinoma of skin) 12/22/1994   lower right upper arm  . BCC (basal cell carcinoma of skin) 12/22/1994   right upper arm sholder   . BCC (basal cell carcinoma of skin) 08/11/1995   left leg below knee  . BCC (basal cell carcinoma of skin) 04/11/2002   mid back  . BCC (basal cell carcinoma of skin) 12/10/2002   right center upper back  . BCC (basal cell carcinoma of skin) 05/26/2005   right post sholder  . BCC (basal cell carcinoma) 05/09/2014   left inner shin  . BCC (basal cell carcinoma) 06/12/2014   left forearm  . Bowen's disease 10/07/1994   right post knee, right inner forearm/wrist  . Bowen's disease 08/11/1995   left sholder  . Cataract    left  . Chronic pain   .  Depression   . Diverticulosis of colon 1998   mild  . DJD (degenerative joint disease)   . Duodenal ulcer    h/o  . GERD (gastroesophageal reflux disease)   . History of SCC (squamous cell carcinoma) of skin    Dr. Denna Haggard  . History of sinus bradycardia   . HLD (hyperlipidemia)    hypertriglyceridemia  . Hypertensive retinopathy of both eyes 11/18/2017  . IBS (irritable bowel syndrome)   . Internal hemorrhoids 1998  . Mitral regurgitation    mild  . Osteoarthritis    feet,shoulder,neck,back,hips and hands.  . Pneumonia   . PONV (postoperative nausea and vomiting)   . Rotator cuff tear, right 02/2019   infraspinatus and supraspinatus, and dislocation of long head of bicep tendon (Dr. Alvan Dame)  . SCC (squamous cell carcinoma) 11/26/2014   left hand, right hand, right deltoid  . SCC (squamous cell carcinoma) 05/03/2017   left cheek  . Sleep apnea    uses a mouth guard nightly  . Squamous cell carcinoma in situ (SCCIS) 07/05/2018   left hand  . Vitamin D deficiency    mild    Past Surgical History:  Procedure Laterality Date  . ABDOMINAL HYSTERECTOMY    . BLEPHAROPLASTY Bilateral 01/2018  . BROW LIFT Bilateral 10/10/2018   Procedure: BROW LIFT;  Surgeon: Wallace Going, DO;  Location: [facility];  Service: Plastics;  Laterality: Bilateral;  . BUNIONECTOMY     R 12/08, L 2004 (Dr. Janus Molder)  . CARPAL  TUNNEL RELEASE  1989   bilateral  . CATARACT EXTRACTION Bilateral Left in 08/2012, Right 04/2016   Dr.Hecker  . CESAREAN SECTION     x2  . CHOLECYSTECTOMY  1992  . CLOSED REDUCTION NASAL FRACTURE N/A 08/29/2018   Procedure: CLOSED REDUCTION NASAL FRACTURE;  Surgeon: Wallace Going, DO;  Location: [place] [facility];  Service: Plastics;  Laterality: N/A;  1 hour, please  . COLONOSCOPY  2006  . COLONOSCOPY  01/2012   due again 01/2022; mild diverticulosis  . epidural steroid injection, back  02/2010  . HIP SURGERY     right bursectomy x 3  . HIP SURGERY Right 2000   torn cartilage, repaired  . INGUINAL HERNIA REPAIR  9/09   bilat  . NECK SURGERY  1989   c6-7 cervical laminectomy and diskecotmy  . NECK SURGERY  1989   per patient posterior area of neck  . REVERSE SHOULDER ARTHROPLASTY Right 04/11/2019   Procedure: REVERSE SHOULDER ARTHROPLASTY;  Surgeon: Nicholes Stairs, MD;  Location: [facility];  Service: Orthopedics;  Laterality: Right;  Regional Block  . SHOULDER SURGERY  2/05   left rotator cuff repair  . SHOULDER SURGERY Left 03/14/2018   rotator cuff repair; Dr. Tonita Cong  . TONSILLECTOMY  age 61  . TOTAL ABDOMINAL HYSTERECTOMY W/ BILATERAL SALPINGOOPHORECTOMY  1997   fibroids  . TROCHANTERIC BURSA EXCISION Right 1978  . TUBAL LIGATION  1986  . WISDOM TOOTH EXTRACTION      Review of systems negative except as noted in HPI / PMHx or noted below:  Review of Systems  Constitutional: Negative.   HENT: Negative.   Eyes: Negative.   Respiratory: Negative.   Cardiovascular: Negative.   Gastrointestinal: Negative.   Genitourinary: Negative.   Musculoskeletal: Negative.   Skin: Negative.   Neurological: Negative.   Endo/Heme/Allergies: Negative.   Psychiatric/Behavioral: Negative.      Objective:    Physical Exam Constitutional:      Appearance: She  is not diaphoretic.  HENT:     Head: Normocephalic.     Right Ear: Tympanic membrane, ear canal and  external ear normal.     Left Ear: Tympanic membrane, ear canal and external ear normal.     Nose: Nose normal. No mucosal edema or rhinorrhea.     Mouth/Throat:     Pharynx: Uvula midline. No oropharyngeal exudate.  Eyes:     Conjunctiva/sclera: Conjunctivae normal.  Neck:     Thyroid: No thyromegaly.     Trachea: Trachea normal. No tracheal tenderness or tracheal deviation.  Cardiovascular:     Rate and Rhythm: Normal rate and regular rhythm.     Heart sounds: Normal heart sounds, S1 normal and S2 normal. No murmur heard.   Pulmonary:     Effort: No respiratory distress.     Breath sounds: Normal breath sounds. No stridor. No wheezing or rales.  Lymphadenopathy:     Head:     Right side of head: No tonsillar adenopathy.     Left side of head: No tonsillar adenopathy.     Cervical: No cervical adenopathy.  Skin:    Findings: No erythema or rash.     Nails: There is no clubbing.  Neurological:     Mental Status: She is alert.     Diagnostics: Results of blood tests obtained 02 October 2019 after receiving the Pneumovax vaccine did not identify any response to that vaccination.  Assessment and Plan:   1. Hypogammaglobulinemia ([place])   2. Bronchiectasis without complication ([place])   3. Nocardia infection   4. LPRD (laryngopharyngeal reflux disease)     1.  Continue immunoglobulin infusions  2.  Continue to treat reflex / LPR:   A. Minimize caffeine consumption  B. Nexium 40 mg in AM  C. Famotidine 40 mg in PM  3.  Treat and prevent inflammation of upper airway:   A.  Montelukast 10 mg -1 tablet 1 time per day  B.  OTC Rhinocort/Nasacort -1 spray each nostril 1 time per day  4.  Blood - IgA/G/M  5.  Return to clinic in 12 weeks or earlier if problem  We will measure Beverly [name]'s immunoglobulin levels now that she has been utilizing her subcutaneous immunoglobulin for 8 weeks.  She will continue on therapy directed against reflux.  She has been having some upper airway  issues which may be contributing to some of her continued postnasal drip and I am going to treat her with montelukast and a nasal steroid on a regular basis as noted above for the next 12 weeks.  I will regroup with her at that point in time or earlier if there is a problem.  Allena Katz, MD Allergy / Immunology [place]

## 2020-01-24 ENCOUNTER — Encounter: Payer: Self-pay | Admitting: Allergy and Immunology

## 2020-01-24 LAB — IGG, IGA, IGM
IgA/Immunoglobulin A, Serum: 74 mg/dL (ref 64–422)
IgG (Immunoglobin G), Serum: 908 mg/dL (ref 586–1602)
IgM (Immunoglobulin M), Srm: 29 mg/dL (ref 26–217)

## 2020-01-25 ENCOUNTER — Encounter: Payer: Self-pay | Admitting: Internal Medicine

## 2020-01-25 ENCOUNTER — Ambulatory Visit: Payer: Medicare PPO | Admitting: Internal Medicine

## 2020-01-25 ENCOUNTER — Other Ambulatory Visit: Payer: Self-pay

## 2020-01-25 VITALS — BP 118/60 | HR 58 | Ht 65.0 in | Wt 154.4 lb

## 2020-01-25 DIAGNOSIS — D801 Nonfamilial hypogammaglobulinemia: Secondary | ICD-10-CM

## 2020-01-25 DIAGNOSIS — Z9989 Dependence on other enabling machines and devices: Secondary | ICD-10-CM | POA: Diagnosis not present

## 2020-01-25 DIAGNOSIS — A439 Nocardiosis, unspecified: Secondary | ICD-10-CM

## 2020-01-25 DIAGNOSIS — G4733 Obstructive sleep apnea (adult) (pediatric): Secondary | ICD-10-CM

## 2020-01-25 DIAGNOSIS — J310 Chronic rhinitis: Secondary | ICD-10-CM | POA: Diagnosis not present

## 2020-01-25 DIAGNOSIS — J479 Bronchiectasis, uncomplicated: Secondary | ICD-10-CM

## 2020-01-25 MED FILL — CELECOXIB 200 MG CAP: 200 | 90 days supply | Qty: 180 | Fill #1

## 2020-01-25 MED FILL — METHOCARBAMOL 500 MG TABLET: 500 | 60 days supply | Qty: 180 | Fill #1

## 2020-01-25 NOTE — Progress Notes (Signed)
Beverly [name]    [id number]    Mar 27, 1945  Primary Care Physician:Hernandez Everardo Beals, MD Date of Appointment: 01/25/2020 Established Patient Visit  Chief complaint:   Chief Complaint  Patient presents with  . Follow-up    Pt is here for medication management.  Pt states she has started the weekly IVIG infusions and has also started the bactrim medication to help with her bronchiectasis. Pt states her cough is about the same since last visit and also still has a lot of sinus drainage.     HPI: Beverly [name] is a 74 y.o. woman with bronchiectasis a hypogammaglobulinemia who presents for follow up.  Interval Updates: Had her IVIG infusion last week and is feeling better, and IgG levels are improved.  Still on bactrim which is supposed to be a 6 month course for the nocardiosis. MRI brain reviewed and negative for lesions.   Doing airway clearance but is still having cough.  Having fatigue No fevers chills night sweats or hemoptysis.   Has been started nasacort. Nasal saline rinses are helping.   1+ AFB noted on her sputum culture from October 2021.   I have reviewed the patient's family social and past medical history and updated as appropriate.   Past Medical History:  Diagnosis Date  . Allergy   . Amaurosis fugax    negative w/u through [facility]  . Asthma   . Back pain    Dr Joline Salt 02/2010-epidural injection x 2 at L4-5 with good effect  . 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 BCC (basal cell carcinoma of skin) 05/09/2014   mid lower back  . BCC (basal cell carcinoma of skin) 05/03/2017   right low back  . BCC (basal cell carcinoma of skin) 05/03/2017   left upper back  . BCC (basal cell carcinoma of skin) 07/05/2018   left mid back  . BCC (basal cell carcinoma of skin) 05/20/1992   upper back  . BCC (basal cell carcinoma of skin) 07/29/1993   left sholder medial  . BCC (basal cell carcinoma of skin) 07/29/1993   left  sholder lateral  . BCC (basal cell carcinoma of skin) 07/29/1993   right thigh  . BCC (basal cell carcinoma of skin) 07/29/1993   right sholder  . BCC (basal cell carcinoma of skin) 12/22/1994   right mid forearm  . BCC (basal cell carcinoma of skin) 12/22/1994   right upper forearm  . BCC (basal cell carcinoma of skin) 12/22/1994   lower right upper arm  . BCC (basal cell carcinoma of skin) 12/22/1994   right upper arm sholder  . BCC (basal cell carcinoma of skin) 08/11/1995   left leg below knee  . BCC (basal cell carcinoma of skin) 04/11/2002   mid back  . BCC (basal cell carcinoma of skin) 12/10/2002   right center upper back  . BCC (basal cell carcinoma of skin) 05/26/2005   right post sholder  . BCC (basal cell carcinoma) 05/09/2014   left inner shin  . BCC (basal cell carcinoma) 06/12/2014   left forearm  . Bowen's disease 10/07/1994   right post knee, right inner forearm/wrist  . Bowen's disease 08/11/1995   left sholder  . Cataract    left  . Chronic pain   . Depression   . Diverticulosis of colon 1998   mild  . DJD (degenerative joint disease)   . Duodenal ulcer  h/o  . GERD (gastroesophageal reflux disease)   . History of SCC (squamous cell carcinoma) of skin    Dr. Denna Haggard  . History of sinus bradycardia   . HLD (hyperlipidemia)    hypertriglyceridemia  . Hypertensive retinopathy of both eyes 11/18/2017  . IBS (irritable bowel syndrome)   . Internal hemorrhoids 1998  . Mitral regurgitation    mild  . Osteoarthritis    feet,shoulder,neck,back,hips and hands.  . Pneumonia   . PONV (postoperative nausea and vomiting)   . Rotator cuff tear, right 02/2019   infraspinatus and supraspinatus, and dislocation of long head of bicep tendon (Dr. Alvan Dame)  . SCC (squamous cell carcinoma) 11/26/2014   left hand, right hand, right deltoid  . SCC (squamous cell carcinoma) 05/03/2017   left cheek  . Sleep apnea    uses a mouth guard nightly  . Squamous cell  carcinoma in situ (SCCIS) 07/05/2018   left hand  . Vitamin D deficiency    mild    Past Surgical History:  Procedure Laterality Date  . ABDOMINAL HYSTERECTOMY    . BLEPHAROPLASTY Bilateral 01/2018  . BROW LIFT Bilateral 10/10/2018   Procedure: BROW LIFT;  Surgeon: Wallace Going, DO;  Location: [facility];  Service: Plastics;  Laterality: Bilateral;  . BUNIONECTOMY     R 12/08, L 2004 (Dr. Janus Molder)  . CARPAL TUNNEL RELEASE  1989   bilateral  . CATARACT EXTRACTION Bilateral Left in 08/2012, Right 04/2016   Dr.Hecker  . CESAREAN SECTION     x2  . CHOLECYSTECTOMY  1992  . CLOSED REDUCTION NASAL FRACTURE N/A 08/29/2018   Procedure: CLOSED REDUCTION NASAL FRACTURE;  Surgeon: Wallace Going, DO;  Location: [facility];  Service: Plastics;  Laterality: N/A;  1 hour, please  . COLONOSCOPY  2006  . COLONOSCOPY  01/2012   due again 01/2022; mild diverticulosis  . epidural steroid injection, back  02/2010  . HIP SURGERY     right bursectomy x 3  . HIP SURGERY Right 2000   torn cartilage, repaired  . INGUINAL HERNIA REPAIR  9/09   bilat  . NECK SURGERY  1989   c6-7 cervical laminectomy and diskecotmy  . NECK SURGERY  1989   per patient posterior area of neck  . REVERSE SHOULDER ARTHROPLASTY Right 04/11/2019   Procedure: REVERSE SHOULDER ARTHROPLASTY;  Surgeon: Nicholes Stairs, MD;  Location: [facility];  Service: Orthopedics;  Laterality: Right;  Regional Block  . SHOULDER SURGERY  2/05   left rotator cuff repair  . SHOULDER SURGERY Left 03/14/2018   rotator cuff repair; Dr. Tonita Cong  . TONSILLECTOMY  age 58  . TOTAL ABDOMINAL HYSTERECTOMY W/ BILATERAL SALPINGOOPHORECTOMY  1997   fibroids  . TROCHANTERIC BURSA EXCISION Right 1978  . TUBAL LIGATION  1986  . WISDOM TOOTH EXTRACTION      Family History  Problem Relation Age of Onset  . Depression Mother   . Schizophrenia Mother   . Cancer Father        oral  . Heart disease Father         bradycardia  . Cancer Sister        skin and lung  . COPD Sister   . Hypertension Sister   . Osteoporosis Sister        compression fx's x 3 11/2018  . Hyperthyroidism Brother   . Cancer Brother        metastatic cancer to bone (?primary)  . Hyperlipidemia Daughter   .  Asthma Son   . Cancer Paternal Grandmother 78       colon cancer, metastatic to liver  . Colon cancer Paternal Grandmother   . Diabetes Maternal Grandmother   . ADD / ADHD Other     Social History   Occupational History  . Occupation: STAFF Optician, dispensing: [employer]  Tobacco Use  . Smoking status: Never Smoker  . Smokeless tobacco: Never Used  Vaping Use  . Vaping Use: Never used  Substance and Sexual Activity  . Alcohol use: Yes    Alcohol/week: 7.0 standard drinks    Types: 7 Glasses of wine per week    Comment: 1 glass of wine daily  . Drug use: No  . Sexual activity: Not Currently     Physical Exam: Blood pressure 118/60, pulse (!) 58, height 5\' 5"  (1.651 m), weight 154 lb 6.4 oz (70 kg), SpO2 99 %.  Gen:      No acute distress Lungs:    No increased respiratory effort, symmetric chest wall excursion, clear to auscultation bilaterally, no wheezes or crackles CV:         Regular rate and rhythm; no murmurs, rubs, or gallops.  No pedal edema  Data Reviewed: Imaging: I have personally reviewed the CT Chest May 2021 which shows central and lower lobe bronchiectasis. TBM on expiratory cuts.   PFTs: Spirometry July 2021 Normal spirometry, no airflow limitation. Unchanged from June 2021 and April 2019.   Sleep study June 2021 IMPRESSIONS - No significant obstructive sleep apnea occurred during this study (AHI = 1.9/h). - No significant central sleep apnea occurred during this study (CAI = 0.7/h). - The patient had minimal or no oxygen desaturation during the study (Min O2 = 89.0%) - The patient snored with soft snoring volume.  Labs: Lab Results  Component Value Date   WBC  4.5 08/22/2019   HGB 13.3 08/22/2019   HCT 40.3 08/22/2019   MCV 99 (H) 08/22/2019   PLT 208 06/22/2019   Lab Results  Component Value Date   NA 140 01/10/2020   K 4.3 01/10/2020   CL 106 01/10/2020   CO2 28 01/10/2020     Immunization status: Immunization History  Administered Date(s) Administered  . Fluad Quad(high Dose 65+) 11/10/2019  . Hepatitis A 06/17/2006, 01/05/2007  . IPV 04/15/2012  . Influenza Split 10/31/2013  . Influenza, High Dose Seasonal PF 11/16/2016, 11/16/2017, 11/01/2018  . Influenza,inj,quad, With Preservative 11/16/2017, 12/12/2018  . Influenza-Unspecified 11/09/2014, 11/14/2015  . PFIZER SARS-COV-2 Vaccination 02/05/2019, 02/25/2019  . Pneumococcal Conjugate-13 07/20/2013  . Pneumococcal Polysaccharide-23 06/17/2006, 11/26/2014, 09/05/2019  . Tdap 09/16/2004, 10/08/2014  . Typhoid Live 06/17/2011, 06/10/2017  . Unspecified SARS-COV-2 Vaccination 02/20/2019  . Yellow Fever 07/08/2012  . Zoster 04/04/2009  . Zoster Recombinat (Shingrix) 04/28/2016, 08/15/2016    Assessment:  Bronchiectasis with Nocardiosis on 6 months of bactrim therapy with ID.  Possible Mild Persistent asthma OSA with excessive daytime sleepiness.  Chronic cough with LPR Tracheobronchomalacia (TBM) Chronic rhinitis  Plan/Recommendations:  Continue PPI H2 blocker for LPR.   Was prescribed mometasone nasal spray for rhinitis - I agree with this. Continue singulair  Ideally in bronchiectasis (as well as TBM) we like to avoid ICS as this worsens the risk for pneumonia. Continue appliance for OSA. Split night study without worsening  Continue to follow up with Immunology for hypogammaglobulinemia  Continue airway clearance with nebulized saline and flutter valve. Cautioned her on anti-histamines thickening secretions and making them harder  to expectorate. But she can try it for a few days and see if it helps rhinitis.   She hasn't needed to take albuterol for wheezing.  Continue as needed. PFTs have been stable.    Return to Care: Return in about 3 months (around 04/24/2020).  Lenice Llamas, MD Pulmonary and [place]

## 2020-01-25 NOTE — Patient Instructions (Signed)
The patient should have follow up scheduled with myself in 3 months.   mometasone- 1 spray on each side of your nose twice a day for first week, then 1 spray on each side.   Instructions for use:  If you also use a saline nasal spray or rinse, use that first.  Position the head with the chin slightly tucked. Use the right hand to spray into the left nostril and the right hand to spray into the left nostril.   Point the bottle away from the septum of your nose (cartilage that divides the two sides of your nose).   Hold the nostril closed on the opposite side from where you will spray  Spray once and gently sniff to pull the medicine into the higher parts of your nose.  Don't sniff too hard as the medicine will drain down the back of your throat instead.  Repeat with a second spray on the same side if prescribed.  Repeat on the other side of your nose.

## 2020-01-26 ENCOUNTER — Encounter: Payer: Self-pay | Admitting: Internal Medicine

## 2020-01-29 ENCOUNTER — Encounter: Payer: Self-pay | Admitting: Internal Medicine

## 2020-01-29 LAB — MYCOBACTERIA,CULT W/FLUOROCHROME SMEAR
MICRO NUMBER:: 11069137
SPECIMEN QUALITY:: ADEQUATE

## 2020-01-30 DIAGNOSIS — G894 Chronic pain syndrome: Secondary | ICD-10-CM | POA: Diagnosis not present

## 2020-01-30 DIAGNOSIS — Z79891 Long term (current) use of opiate analgesic: Secondary | ICD-10-CM | POA: Diagnosis not present

## 2020-01-30 DIAGNOSIS — M150 Primary generalized (osteo)arthritis: Secondary | ICD-10-CM | POA: Diagnosis not present

## 2020-01-30 DIAGNOSIS — M47816 Spondylosis without myelopathy or radiculopathy, lumbar region: Secondary | ICD-10-CM | POA: Diagnosis not present

## 2020-02-03 DIAGNOSIS — G4733 Obstructive sleep apnea (adult) (pediatric): Secondary | ICD-10-CM | POA: Diagnosis not present

## 2020-02-03 DIAGNOSIS — J310 Chronic rhinitis: Secondary | ICD-10-CM | POA: Diagnosis not present

## 2020-02-03 DIAGNOSIS — J479 Bronchiectasis, uncomplicated: Secondary | ICD-10-CM | POA: Diagnosis not present

## 2020-02-06 ENCOUNTER — Other Ambulatory Visit: Payer: Self-pay

## 2020-02-06 ENCOUNTER — Other Ambulatory Visit: Payer: Medicare PPO

## 2020-02-06 DIAGNOSIS — Z5181 Encounter for therapeutic drug level monitoring: Secondary | ICD-10-CM | POA: Diagnosis not present

## 2020-02-06 LAB — BASIC METABOLIC PANEL
BUN: 15 mg/dL (ref 7–25)
CO2: 29 mmol/L (ref 20–32)
Calcium: 8.9 mg/dL (ref 8.6–10.4)
Chloride: 108 mmol/L (ref 98–110)
Creat: 0.74 mg/dL (ref 0.60–0.93)
Glucose, Bld: 70 mg/dL (ref 65–99)
Potassium: 4 mmol/L (ref 3.5–5.3)
Sodium: 142 mmol/L (ref 135–146)

## 2020-02-06 MED FILL — BELBUCA 450 MCG FILM: 450 | 30 days supply | Qty: 60 | Fill #1

## 2020-02-07 DIAGNOSIS — G5602 Carpal tunnel syndrome, left upper limb: Secondary | ICD-10-CM | POA: Diagnosis not present

## 2020-02-07 DIAGNOSIS — M65312 Trigger thumb, left thumb: Secondary | ICD-10-CM | POA: Diagnosis not present

## 2020-02-09 DIAGNOSIS — G5602 Carpal tunnel syndrome, left upper limb: Secondary | ICD-10-CM | POA: Insufficient documentation

## 2020-02-11 MED FILL — GABAPENTIN 800 MG TABS: 800 | 90 days supply | Qty: 90 | Fill #2

## 2020-02-12 MED FILL — HYDROCODON-APAP 7.5-325: 7.5-325 | 25 days supply | Qty: 100 | Fill #0

## 2020-02-19 ENCOUNTER — Other Ambulatory Visit: Payer: Self-pay | Admitting: Internal Medicine

## 2020-02-19 DIAGNOSIS — E782 Mixed hyperlipidemia: Secondary | ICD-10-CM

## 2020-02-19 MED FILL — SULFAMETHOXAZOLE-TMP DS TAB: 800-160 | 15 days supply | Qty: 90 | Fill #4

## 2020-02-20 ENCOUNTER — Other Ambulatory Visit: Payer: Self-pay

## 2020-02-20 ENCOUNTER — Other Ambulatory Visit: Payer: Self-pay | Admitting: Internal Medicine

## 2020-02-20 ENCOUNTER — Encounter: Payer: Self-pay | Admitting: Infectious Diseases

## 2020-02-20 ENCOUNTER — Ambulatory Visit: Payer: Medicare PPO | Admitting: Infectious Diseases

## 2020-02-20 VITALS — BP 148/75 | HR 61 | Temp 98.0°F | Wt 155.4 lb

## 2020-02-20 DIAGNOSIS — A439 Nocardiosis, unspecified: Secondary | ICD-10-CM

## 2020-02-20 DIAGNOSIS — Z5181 Encounter for therapeutic drug level monitoring: Secondary | ICD-10-CM

## 2020-02-20 DIAGNOSIS — R899 Unspecified abnormal finding in specimens from other organs, systems and tissues: Secondary | ICD-10-CM | POA: Diagnosis not present

## 2020-02-20 MED FILL — ATORVASTATIN 40 MG TABLET: 40 | 90 days supply | Qty: 90 | Fill #0

## 2020-02-20 NOTE — Progress Notes (Signed)
[facility]                                                             [place], [place], [place], [address]                                                                  Phn. [phone]; Fax: [phone]                                                                             Date: 02/20/2020  Reason for Follow Up- Nocardia PNA  Assessment/Plan Beverly [name] a 75 year old female with a history of mild persistent asthma,GERD,Bronchiectasis, OSA, Tracheomalacia, Primary immunodeficiency on immunoglobulin therapy, Rt reverse shoulder arthroplasty, skin malignancy who was diagnosed with Nocardia nova PNA with positive sputum cx  on 07/21/19 and was started on Bactrim therapy ( 15mg /kg/day) from 10/25/19. She initially had nausea/vomiting with bactrim which was managed with Zofran prn. However, she has developed an AKI with Cr of 1.24 from 0.69. The dosing of Bactrim was reduced to Bactrim 1 DS tab po BID in last clinic visit on 11/29/19 and her Cr seems to have been WNL since then.  Her cough seems to be multifactorial- GERD, LPR, mild persistent asthma including Nocardia PNA. She has been following up with Pulmonary. She has been using hypertonic saline neubuliser and flutter valve. Has been on Esomeprazole and Famotidine for GERD. Oral appliance for OSA. She is also getting monthly injections with immunoglobulins for primary hypogammaglobulinemia.  Her sputum cx from 11/29/19 grew rare (1+) AFB bacilli. No mycobacterial species have been isolated after 8 weeks. I have gave her sputum collection cups to collect more sample for AFB smear and cultures  Continue Bactrim 1 tab po BID. Will plan to treat her for 6 months through early March given her hypogammoglobulinemia and past h/o skin malignancy and get an imaging around the end of treatment. She says she has adequate refills.   CBC and  CMP today  Fu with me in 1 month  Follow up for management of GERD/Asthma/LPR and Hypogammaglobulinemia per Pulmonary and Immunology   All questions and concerns were discussed and addressed. Patient verbalized understanding of the plan. ____________________________________________________________________________________________________________________ Interval events on 02/20/20 She says she has not had as much mucous plugs as she had before and it is more stringy stuff that comes out. Cough is usually  in the morning and more when she  moves around. She feels the bactrim has helped to get the phlegm out. However, she has not noticed any significant improvement in terms of SOB.   She says she was able to talk with her daughter around 45 minutes before sept 2020 but since that time she feels she gets SOB  even when talking for 5-10 minutes with her daughter. She feels the SOB has stayed the same since September 2020.  Denies any fever/chills but she says she sweats always. Appetite is good. No changes in weight. No N/V/D No urinary symptoms.  She feels tired but says the fatigue has gotten better as she does not need to sleep in the day as much as she used to before and takes a brief nap.   She is following up with Pulm and is also getting weekly IV immunoglobulins from her immunologist.  ROS: 11 point ROS done with pertinent positives and negatives listed above. Otherwise, ROS is negative  Past Medical History:  Diagnosis Date  . Allergy   . Amaurosis fugax    negative w/u through [facility]  . Asthma   . Back pain    Dr Byrd Hesselbach 02/2010-epidural injection x 2 at L4-5 with good effect  . 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 BCC (basal cell carcinoma of skin) 05/09/2014   mid lower back  . BCC (basal cell carcinoma of skin) 05/03/2017   right low back  . BCC (basal cell carcinoma of skin) 05/03/2017   left upper back  . BCC (basal cell carcinoma of skin) 07/05/2018    left mid back  . BCC (basal cell carcinoma of skin) 05/20/1992   upper back  . BCC (basal cell carcinoma of skin) 07/29/1993   left sholder medial  . BCC (basal cell carcinoma of skin) 07/29/1993   left sholder lateral  . BCC (basal cell carcinoma of skin) 07/29/1993   right thigh  . BCC (basal cell carcinoma of skin) 07/29/1993   right sholder  . BCC (basal cell carcinoma of skin) 12/22/1994   right mid forearm  . BCC (basal cell carcinoma of skin) 12/22/1994   right upper forearm  . BCC (basal cell carcinoma of skin) 12/22/1994   lower right upper arm  . BCC (basal cell carcinoma of skin) 12/22/1994   right upper arm sholder  . BCC (basal cell carcinoma of skin) 08/11/1995   left leg below knee  . BCC (basal cell carcinoma of skin) 04/11/2002   mid back  . BCC (basal cell carcinoma of skin) 12/10/2002   right center upper back  . BCC (basal cell carcinoma of skin) 05/26/2005   right post sholder  . BCC (basal cell carcinoma) 05/09/2014   left inner shin  . BCC (basal cell carcinoma) 06/12/2014   left forearm  . Bowen's disease 10/07/1994   right post knee, right inner forearm/wrist  . Bowen's disease 08/11/1995   left sholder  . Cataract    left  . Chronic pain   . Depression   . Diverticulosis of colon 1998   mild  . DJD (degenerative joint disease)   . Duodenal ulcer    h/o  . GERD (gastroesophageal reflux disease)   . History of SCC (squamous cell carcinoma) of skin    Dr. Jorja Loa  . History of sinus bradycardia   . HLD (hyperlipidemia)    hypertriglyceridemia  . Hypertensive retinopathy of both eyes 11/18/2017  . IBS (irritable bowel syndrome)   . Internal hemorrhoids 1998  . Mitral regurgitation    mild  . Osteoarthritis    feet,shoulder,neck,back,hips and hands.  . Pneumonia   . PONV (postoperative nausea and vomiting)   . Rotator cuff tear, right 02/2019   infraspinatus and supraspinatus, and dislocation of long head of bicep tendon (Dr. Charlann Boxer)  .  SCC (squamous  cell carcinoma) 11/26/2014   left hand, right hand, right deltoid  . SCC (squamous cell carcinoma) 05/03/2017   left cheek  . Sleep apnea    uses a mouth guard nightly  . Squamous cell carcinoma in situ (SCCIS) 07/05/2018   left hand  . Vitamin D deficiency    mild   Past Surgical History:  Procedure Laterality Date  . ABDOMINAL HYSTERECTOMY    . BLEPHAROPLASTY Bilateral 01/2018  . BROW LIFT Bilateral 10/10/2018   Procedure: BROW LIFT;  Surgeon: Wallace Going, DO;  Location: [facility];  Service: Plastics;  Laterality: Bilateral;  . BUNIONECTOMY     R 12/08, L 2004 (Dr. Janus Molder)  . CARPAL TUNNEL RELEASE  1989   bilateral  . CATARACT EXTRACTION Bilateral Left in 08/2012, Right 04/2016   Dr.Hecker  . CESAREAN SECTION     x2  . CHOLECYSTECTOMY  1992  . CLOSED REDUCTION NASAL FRACTURE N/A 08/29/2018   Procedure: CLOSED REDUCTION NASAL FRACTURE;  Surgeon: Wallace Going, DO;  Location: [facility];  Service: Plastics;  Laterality: N/A;  1 hour, please  . COLONOSCOPY  2006  . COLONOSCOPY  01/2012   due again 01/2022; mild diverticulosis  . epidural steroid injection, back  02/2010  . HIP SURGERY     right bursectomy x 3  . HIP SURGERY Right 2000   torn cartilage, repaired  . INGUINAL HERNIA REPAIR  9/09   bilat  . NECK SURGERY  1989   c6-7 cervical laminectomy and diskecotmy  . NECK SURGERY  1989   per patient posterior area of neck  . REVERSE SHOULDER ARTHROPLASTY Right 04/11/2019   Procedure: REVERSE SHOULDER ARTHROPLASTY;  Surgeon: Nicholes Stairs, MD;  Location: [facility];  Service: Orthopedics;  Laterality: Right;  [facility]  . SHOULDER SURGERY  2/05   left rotator cuff repair  . SHOULDER SURGERY Left 03/14/2018   rotator cuff repair; Dr. Tonita Cong  . TONSILLECTOMY  age 3  . TOTAL ABDOMINAL HYSTERECTOMY W/ BILATERAL SALPINGOOPHORECTOMY  1997   fibroids  . TROCHANTERIC BURSA EXCISION Right 1978  . TUBAL  LIGATION  1986  . WISDOM TOOTH EXTRACTION     Current Outpatient Medications on File Prior to Visit  Medication Sig Dispense Refill  . acetaminophen (TYLENOL) 500 MG tablet Take 1,000 mg by mouth every 6 (six) hours as needed for moderate pain.     Marland Kitchen albuterol (PROVENTIL HFA;VENTOLIN HFA) 108 (90 Base) MCG/ACT inhaler Inhale 2 puffs into the lungs every 6 (six) hours as needed for wheezing or shortness of breath. 18 g 1  . amoxicillin (AMOXIL) 500 MG tablet Take 2,000 mg by mouth as directed. For dental appointments    . aspirin EC 81 MG tablet Take 81 mg by mouth daily.    Marland Kitchen atorvastatin (LIPITOR) 40 MG tablet TAKE 1 TABLET BY MOUTH DAILY. 90 tablet 1  . BELBUCA 450 MCG FILM     . Calcium Carbonate-Vitamin D 600-200 MG-UNIT TABS Take 1 tablet by mouth 2 (two) times daily.    . celecoxib (CELEBREX) 200 MG capsule TAKE 1 CAPSULE BY MOUTH TWICE DAILY 180 capsule 1  . diclofenac Sodium (VOLTAREN) 1 % GEL Apply 2 g topically 4 (four) times daily as needed (joint pain).     Marland Kitchen esomeprazole (NEXIUM) 40 MG capsule TAKE 1 CAPSULE BY MOUTH DAILY IN THE MORNING 90 capsule 0  . famotidine (PEPCID) 40 MG tablet Take 1 tablet (40 mg total) by mouth at bedtime. [place]  tablet 0  . Fluorouracil 4 % CREA Apply 1 application topically every 3 (three) days. Dr Denna Haggard    . gabapentin (NEURONTIN) 800 MG tablet Take 800 mg by mouth at bedtime.    . GUAIFENESIN ER PO Take 400 mg by mouth 2 (two) times daily.    Marland Kitchen HIZENTRA 10 GM/50ML SOLN     . HYDROcodone-acetaminophen (NORCO) 7.5-325 MG tablet Take 1 tablet by mouth every 12 (twelve) hours as needed for moderate pain. Dr Hardin Negus    . loperamide (IMODIUM A-D) 2 MG tablet Take 2 mg by mouth 4 (four) times daily as needed for diarrhea or loose stools.    . methocarbamol (ROBAXIN) 500 MG tablet Take 500 mg by mouth in the morning and at bedtime.     . montelukast (SINGULAIR) 10 MG tablet Take 1 tablet (10 mg total) by mouth at bedtime. 90 tablet 1  . Multiple  Vitamins-Minerals (MULTIVITAMIN WITH MINERALS) tablet Take 1 tablet by mouth daily.    . niacinamide 500 MG tablet Take 500 mg by mouth 2 (two) times daily with a meal.    . omega-3 acid ethyl esters (LOVAZA) 1 g capsule TAKE 2 CAPSULES BY MOUTH TWO TIMES DAILY 180 capsule 1  . ondansetron (ZOFRAN) 4 MG tablet Take 1 tablet (4 mg total) by mouth every 8 (eight) hours as needed for nausea or vomiting. 60 tablet 2  . PARoxetine (PAXIL) 30 MG tablet Take 60 mg by mouth daily.     Marland Kitchen Respiratory Therapy Supplies (FLUTTER) DEVI 1 puff by Does not apply route 2 (two) times daily. 1 each 0  . sodium chloride HYPERTONIC 3 % nebulizer solution Take by nebulization in the morning and at bedtime. 750 mL 12  . triamcinolone (NASACORT) 55 MCG/ACT AERO nasal inhaler Place 2 sprays into the nose daily.     No current facility-administered medications on file prior to visit.   Allergies  Allergen Reactions  . Adhesive [Tape] Rash  . Codeine Rash   Social History   Socioeconomic History  . Marital status: Divorced    Spouse name: Not on file  . Number of children: 2  . Years of education: Not on file  . Highest education level: Not on file  Occupational History  . Occupation: STAFF Optician, dispensing: [employer]  Tobacco Use  . Smoking status: Never Smoker  . Smokeless tobacco: Never Used  Vaping Use  . Vaping Use: Never used  Substance and Sexual Activity  . Alcohol use: Yes    Alcohol/week: 7.0 standard drinks    Types: 7 Glasses of wine per week    Comment: 1 glass of wine daily  . Drug use: No  . Sexual activity: Not Currently  Other Topics Concern  . 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   [place] for [employer] part-time   Social Determinants of Health   Financial  Resource Strain: Not on [employer] Insecurity: Not on file  Transportation Needs: Not on file  Physical Activity: Not on file  Stress: Not on file  Social Connections: Not on file  Intimate Partner Violence: Not on file    Vitals BP (!) 148/75   Pulse 61   Temp 98 F (36.7 C)   Wt 155 lb 6.4 oz (70.5 kg)  BMI 25.86 kg/m   Examination  General - not in acute distress, comfortably sitting in chair HEENT - PEERLA, no pallor and no icterus Chest - b/l clear air entry, no additional sounds CVS- Normal s1s2, RRR Abdomen - Soft, Non tender , non distended Ext- no pedal edema Neuro: grossly normal Back - WNL Psych : calm and cooperative   Recent labs CBC Latest Ref Rng & Units 08/22/2019 06/22/2019 04/12/2019  WBC 3.4 - 10.8 x10E3/uL 4.5 10.0 -  Hemoglobin 11.1 - 15.9 g/dL 13.3 14.1 11.5(L)  Hematocrit 34.0 - 46.6 % 40.3 44.8 35.0(L)  Platelets 150 - 400 K/uL - 208 -   CMP Latest Ref Rng & Units 02/06/2020 01/10/2020 12/29/2019  Glucose 65 - 99 mg/dL 70 68 105(H)  BUN 7 - 25 mg/dL 15 13 11   Creatinine 0.60 - 0.93 mg/dL 0.74 0.85 0.75  Sodium 135 - 146 mmol/L 142 140 140  Potassium 3.5 - 5.3 mmol/L 4.0 4.3 4.3  Chloride 98 - 110 mmol/L 108 106 107  CO2 20 - 32 mmol/L 29 28 27   Calcium 8.6 - 10.4 mg/dL 8.9 9.3 9.1  Total Protein 6.0 - 8.5 g/dL - - -  Total Bilirubin 0.0 - 1.2 mg/dL - - -  Alkaline Phos 39 - 117 IU/L - - -  AST 0 - 40 IU/L - - -  ALT 0 - 32 IU/L - - -     Pertinent Microbiology Results for orders placed or performed in visit on 11/29/19   MYCOBACTERIA, CULTURE, WITH FLUOROCHROME SMEAR     Status: Abnormal   Collection Time: 11/29/19 11:03 AM   Specimen: Sputum  Result Value Ref Range Status   MICRO NUMBER: [id number]  Final   SPECIMEN QUALITY: Adequate  Final   Source: SPUTUM  Final   STATUS: FINAL  Final   SMEAR: (A)  Final    Rare (1 +) acid-fast bacilli seen using the fluorochrome method.   RESULT:   Final    No Mycobacterium species isolated  after 8 weeks incubation.    All pertinent labs/Imagings/notes reviewed. All pertinent plain films and CT images have been personally visualized and interpreted; radiology reports have been reviewed. Decision making incorporated into the Impression / Recommendations.  I spent greater than 25 minutes with the patient including  review of prior medical records with greater than 50% of time in face to face counsel of the patient.    Electronically signed by:  Rosiland Oz, MD Infectious Disease Physician [facility] [address] [place], [place] [address] Phone: [phone]  Fax: [phone]

## 2020-02-20 NOTE — Assessment & Plan Note (Signed)
Continue Bactrim 1 tab po bid Fu in 4 weeks Plan to treat for 6 months total

## 2020-02-20 NOTE — Assessment & Plan Note (Signed)
CBC and CMP today

## 2020-02-20 NOTE — Assessment & Plan Note (Signed)
Will get more samples for AFB sputum smear and cx today

## 2020-02-21 ENCOUNTER — Other Ambulatory Visit: Payer: Medicare PPO

## 2020-02-21 DIAGNOSIS — A439 Nocardiosis, unspecified: Secondary | ICD-10-CM | POA: Diagnosis not present

## 2020-02-21 LAB — CBC
HCT: 36.1 % (ref 35.0–45.0)
Hemoglobin: 11.8 g/dL (ref 11.7–15.5)
MCH: 32.8 pg (ref 27.0–33.0)
MCHC: 32.7 g/dL (ref 32.0–36.0)
MCV: 100.3 fL — ABNORMAL HIGH (ref 80.0–100.0)
MPV: 11.8 fL (ref 7.5–12.5)
Platelets: 144 10*3/uL (ref 140–400)
RBC: 3.6 10*6/uL — ABNORMAL LOW (ref 3.80–5.10)
RDW: 13.2 % (ref 11.0–15.0)
WBC: 5 10*3/uL (ref 3.8–10.8)

## 2020-02-21 LAB — COMPREHENSIVE METABOLIC PANEL
AG Ratio: 2 (calc) (ref 1.0–2.5)
ALT: 67 U/L — ABNORMAL HIGH (ref 6–29)
AST: 32 U/L (ref 10–35)
Albumin: 4.1 g/dL (ref 3.6–5.1)
Alkaline phosphatase (APISO): 113 U/L (ref 37–153)
BUN: 19 mg/dL (ref 7–25)
CO2: 28 mmol/L (ref 20–32)
Calcium: 9 mg/dL (ref 8.6–10.4)
Chloride: 105 mmol/L (ref 98–110)
Creat: 0.92 mg/dL (ref 0.60–0.93)
Globulin: 2.1 g/dL (calc) (ref 1.9–3.7)
Glucose, Bld: 70 mg/dL (ref 65–99)
Potassium: 3.9 mmol/L (ref 3.5–5.3)
Sodium: 139 mmol/L (ref 135–146)
Total Bilirubin: 0.3 mg/dL (ref 0.2–1.2)
Total Protein: 6.2 g/dL (ref 6.1–8.1)

## 2020-02-21 NOTE — Addendum Note (Signed)
Addended by: Harley Alto on: 02/21/2020 11:06 AM   Modules accepted: Orders

## 2020-03-01 ENCOUNTER — Other Ambulatory Visit: Payer: Self-pay

## 2020-03-01 ENCOUNTER — Other Ambulatory Visit: Payer: Medicare PPO

## 2020-03-01 DIAGNOSIS — R899 Unspecified abnormal finding in specimens from other organs, systems and tissues: Secondary | ICD-10-CM

## 2020-03-01 DIAGNOSIS — A439 Nocardiosis, unspecified: Secondary | ICD-10-CM

## 2020-03-02 DIAGNOSIS — A09 Infectious gastroenteritis and colitis, unspecified: Secondary | ICD-10-CM

## 2020-03-03 ENCOUNTER — Other Ambulatory Visit: Payer: Self-pay | Admitting: Internal Medicine

## 2020-03-03 DIAGNOSIS — R197 Diarrhea, unspecified: Secondary | ICD-10-CM

## 2020-03-03 MED FILL — OMEGA-3-ACID ETHYL ESTERS 1: 1 | 45 days supply | Qty: 180 | Fill #1

## 2020-03-03 NOTE — Progress Notes (Signed)
I received a call from [place] regarding a high amount of watery diarrhea.  I am concerned with C diff and she will come the [facility] lab for a stool collection kit.  Order placed in [place].  There is no n/v; patient keeping hydrated.  She is aware [facility] is closed on Monday due to weather.  Thayer Headings, MD

## 2020-03-04 ENCOUNTER — Ambulatory Visit: Payer: Medicare PPO | Admitting: Dermatology

## 2020-03-04 MED FILL — FAMOTIDINE 40 MG TABS: 40 | 90 days supply | Qty: 90 | Fill #0

## 2020-03-04 MED FILL — PARoxetine HCL 40 MG TABS: 40 | 90 days supply | Qty: 90 | Fill #0

## 2020-03-05 ENCOUNTER — Other Ambulatory Visit: Payer: Self-pay | Admitting: *Deleted

## 2020-03-05 ENCOUNTER — Other Ambulatory Visit (HOSPITAL_COMMUNITY): Payer: Self-pay | Admitting: Anesthesiology

## 2020-03-05 DIAGNOSIS — J310 Chronic rhinitis: Secondary | ICD-10-CM | POA: Diagnosis not present

## 2020-03-05 DIAGNOSIS — G4733 Obstructive sleep apnea (adult) (pediatric): Secondary | ICD-10-CM | POA: Diagnosis not present

## 2020-03-05 DIAGNOSIS — J479 Bronchiectasis, uncomplicated: Secondary | ICD-10-CM | POA: Diagnosis not present

## 2020-03-05 MED ORDER — FAMOTIDINE 40 MG PO TABS: 40.0000 mg | ORAL_TABLET | Freq: Every day | ORAL | 1 refills | Status: DC

## 2020-03-05 NOTE — Progress Notes (Signed)
Stool collection kit created and placed at front for patient pick up. Landis Gandy, RN

## 2020-03-06 ENCOUNTER — Other Ambulatory Visit: Payer: Medicare PPO

## 2020-03-06 ENCOUNTER — Telehealth: Payer: Self-pay

## 2020-03-06 ENCOUNTER — Other Ambulatory Visit: Payer: Self-pay

## 2020-03-06 DIAGNOSIS — R197 Diarrhea, unspecified: Secondary | ICD-10-CM | POA: Diagnosis not present

## 2020-03-06 MED ORDER — VANCOMYCIN HCL 125 MG PO CAPS: 125.0000 mg | ORAL_CAPSULE | Freq: Four times a day (QID) | ORAL | 0 refills | Status: DC

## 2020-03-06 MED FILL — VANCOMYCIN HCL 125 MG CAPS: 125 | 10 days supply | Qty: 40 | Fill #0

## 2020-03-06 NOTE — Telephone Encounter (Signed)
[facility] Patient Advocate Encounter   Received notification from [facility] that prior authorization for VANCOMYCIN is required.   PA submitted on 03/06/20 [place] [id number] Status is pending    [facility] will continue to follow.   Ileene Patrick, [facility] Patient [facility] Phone: [phone] Fax:  [phone]

## 2020-03-06 NOTE — Telephone Encounter (Signed)
[facility] Patient Advocate Encounter  Prior Authorization for Vancomycin has been approved.    PA# [id number] Effective dates: 02/17/20 through 02/15/21  Patients co-pay is $10.00.   [facility] will continue to follow.  Ileene Patrick, [facility] Patient [facility] Phone: [phone] Fax:  [phone]

## 2020-03-07 LAB — CLOSTRIDIUM DIFFICILE TOXIN B, QUALITATIVE, REAL-TIME PCR: Toxigenic C. Difficile by PCR: NOT DETECTED

## 2020-03-08 ENCOUNTER — Telehealth: Payer: Self-pay

## 2020-03-08 NOTE — Telephone Encounter (Signed)
Patient came into office. RN gave patient C.diff test kit.   Beryle Flock, RN

## 2020-03-08 NOTE — Telephone Encounter (Signed)
Patient aware of negative C.diff result. Patient came in to office today to pick up additional C.diff kit, as she feels the first sample she submitted was too old.   Beryle Flock, RN

## 2020-03-08 NOTE — Telephone Encounter (Signed)
-----   Message from Thayer Headings, MD sent at 03/08/2020 12:42 PM EST ----- Regarding: C diff Can you let her know that the c diff is negative?  Thanks

## 2020-03-09 MED FILL — BELBUCA 450 MCG FILM: 450 | 30 days supply | Qty: 60 | Fill #2

## 2020-03-11 ENCOUNTER — Other Ambulatory Visit: Payer: Medicare PPO

## 2020-03-11 ENCOUNTER — Other Ambulatory Visit: Payer: Self-pay

## 2020-03-11 ENCOUNTER — Telehealth: Payer: Self-pay

## 2020-03-11 DIAGNOSIS — R197 Diarrhea, unspecified: Secondary | ICD-10-CM

## 2020-03-11 NOTE — Telephone Encounter (Signed)
Stool sample patient dropped off this morning is inadequate. Stool needs to be collected, refrigerated with in 24 hours of collection time. Patient made aware and is agreeable to pick new kit today. Beverly [name]

## 2020-03-12 ENCOUNTER — Ambulatory Visit: Payer: Medicare PPO | Admitting: Infectious Diseases

## 2020-03-12 ENCOUNTER — Other Ambulatory Visit: Payer: Self-pay

## 2020-03-12 ENCOUNTER — Encounter: Payer: Self-pay | Admitting: Infectious Diseases

## 2020-03-12 VITALS — BP 122/67 | HR 68 | Temp 97.9°F | Wt 147.0 lb

## 2020-03-12 DIAGNOSIS — Z5181 Encounter for therapeutic drug level monitoring: Secondary | ICD-10-CM | POA: Diagnosis not present

## 2020-03-12 DIAGNOSIS — R197 Diarrhea, unspecified: Secondary | ICD-10-CM | POA: Diagnosis not present

## 2020-03-12 DIAGNOSIS — A439 Nocardiosis, unspecified: Secondary | ICD-10-CM | POA: Diagnosis not present

## 2020-03-12 NOTE — Progress Notes (Signed)
[facility]                                                             [place], [place], [place], [address]                                                                  Phn. [phone]; Fax: [phone]                                                                             Date: 03/13/2020  Reason for Follow Up- Nocardia PNA, new diarrhea   Assessment/Plan Beverly [name] a 75 year old female with a history of mild persistent asthma,GERD,Bronchiectasis, OSA, Tracheomalacia, Primary immunodeficiency on immunoglobulin therapy, Rt reverse shoulder arthroplasty, skin malignancy who was diagnosed with Nocardia nova PNA with positive sputum cx  on 07/21/19 and was started on Bactrim therapy ( 15mg /kg/day) from 10/25/19. She initially had nausea/vomiting with bactrim which was managed with Zofran prn. However, she has developed an AKI with Cr of 1.24 from 0.69. The dosing of Bactrim was reduced to Bactrim 1 DS tab po BID in last clinic visit on 11/29/19 and her Cr seems to have been WNL since then.  Her cough seems to be multifactorial- GERD, LPR, mild persistent asthma including Nocardia PNA. She has been following up with Pulmonary. She has been using hypertonic saline neubuliser and flutter valve. Has been on Esomeprazole and Famotidine for GERD. Oral appliance for OSA. She is also getting monthly injections with immunoglobulins for primary hypogammaglobulinemia. Her IgM and IgG are WNL in December 2021   Her sputum cx from 11/29/19 grew rare (1+) AFB bacilli. No mycobacterial species have been isolated after 8 weeks. 2 Follow up AFB sputum smear and Cx in January 2022have been negative.  In terms of her diarrhea which has been there for around 2-3 weeks, 1st stool sample was negative for C diff.Her diarrhea seemed to improve off bactrim with PO Vancomycin. Will reorder one more C diff  test given there was concern of initial stool being a poor specimen. Continue PO vancomycin 125mg  PO qid for 14 days.  Will hold bactrim for now given she is approaching the end of therapy ( previously planned for early March 2022) and new diarrhea which seems to be antibiotic related   CBC and CMP today She will message me in my chart regarding her progress Fu with me in 1 month  Follow up for management of GERD/Asthma/LPR and Hypogammaglobulinemia per Pulmonary and Immunology   All questions and concerns were discussed and addressed. Patient verbalized understanding of the plan. ____________________________________________________________________________________________________________________ Subjective/Interval events 02/20/20 She says she has not had as much mucous plugs as she had before and it is more stringy stuff that comes  out. Cough is usually  in the morning and more when she  moves around. She feels the bactrim has helped to get the phlegm out. However, she has not noticed any significant improvement in terms of SOB.   She says she was able to talk with her daughter around 45 minutes before sept 2020 but since that time she feels she gets SOB even when talking for 5-10 minutes with her daughter. She feels the SOB has stayed the same since September 2020.  Denies any fever/chills but she says she sweats always. Appetite is good. No changes in weight. No N/V/D No urinary symptoms.  She feels tired but says the fatigue has gotten better as she does not need to sleep in the day as much as she used to before and takes a brief nap.   She is following up with Pulm and is also getting weekly IV immunoglobulins from her immunologist.  03/12/20 She has been having loose stool since January 8, which initially started as 3-4 episodes a day and was watery. However, it continued to get worse and started having 5-6 BMs a day. At one point ( Jan 20th), she stopped taking all her meds including  bactrim and she felt her diarrhea got somewhat better. She stopped it for 3 days and started taking PO Vancomycin around 1/18 which she has been taking until now. However, she felt the diarrhea came back right after she started taking all her meds including bactrim.   Denies any fevers, chills, sweats Denies nausea/vomiting.abdominal cramps/GU symptoms Denies recent travel, sick contact, outside food, family member having diarrhea, no new medications   In terms of cough, she feels its getting better, she has less mucous plugs. SOB has not completely gone away, but thinks there is some improvement. She does not seem to have SOB while talking to some one but if she talks for a longer time she feels SOB. She thinks she has lost  7 lbs since September 2021.    ROS: 11 point ROS done with pertinent positives and negatives listed above. 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   right sholder   BCC (basal cell carcinoma of skin) 05/09/2014   mid lower back   BCC (basal cell carcinoma of skin) 05/03/2017   right low back   BCC (basal cell carcinoma of skin) 05/03/2017   left upper back   BCC (basal cell carcinoma of skin) 07/05/2018   left mid back   BCC (basal cell carcinoma of skin) 05/20/1992   upper back   BCC (basal cell carcinoma of skin) 07/29/1993   left sholder medial   BCC (basal cell carcinoma of skin) 07/29/1993   left sholder lateral   BCC (basal cell carcinoma of skin) 07/29/1993   right thigh   BCC (basal cell carcinoma of skin) 07/29/1993   right sholder   BCC (basal cell carcinoma of skin) 12/22/1994   right mid forearm   BCC (basal cell carcinoma of skin) 12/22/1994   right upper forearm   BCC (basal cell carcinoma of skin) 12/22/1994    lower right upper arm   BCC (basal cell carcinoma of skin) 12/22/1994   right upper arm sholder   BCC (basal cell carcinoma  of skin) 08/11/1995   left leg below knee   BCC (basal cell carcinoma of skin) 04/11/2002   mid back   BCC (basal cell carcinoma of skin) 12/10/2002   right center upper back   BCC (basal cell carcinoma of skin) 05/26/2005   right post sholder   BCC (basal cell carcinoma) 05/09/2014   left inner shin   BCC (basal cell carcinoma) 06/12/2014   left forearm   Bowen's disease 10/07/1994   right post knee, right inner forearm/wrist   Bowen's disease 08/11/1995   left sholder   Cataract    left   Chronic pain    Depression    Diverticulosis of colon 1998   mild   DJD (degenerative joint disease)    Duodenal ulcer    h/o   GERD (gastroesophageal reflux disease)    History of SCC (squamous cell carcinoma) of skin    Dr. Denna Haggard   History of sinus bradycardia    HLD (hyperlipidemia)    hypertriglyceridemia   Hypertensive retinopathy of both eyes 11/18/2017   IBS (irritable bowel syndrome)    Internal hemorrhoids 1998   Mitral regurgitation    mild   Osteoarthritis    feet,shoulder,neck,back,hips and hands.   Pneumonia    PONV (postoperative nausea and vomiting)    Rotator cuff tear, right 02/2019   infraspinatus and supraspinatus, and dislocation of long head of bicep tendon (Dr. Alvan Dame)   SCC (squamous cell carcinoma) 11/26/2014   left hand, right hand, right deltoid   SCC (squamous cell carcinoma) 05/03/2017   left cheek   Sleep apnea    uses a mouth guard nightly   Squamous cell carcinoma in situ (SCCIS) 07/05/2018   left hand   Vitamin D deficiency    mild   Past Surgical History:  Procedure Laterality Date   ABDOMINAL HYSTERECTOMY     BLEPHAROPLASTY Bilateral 01/2018   BROW LIFT Bilateral 10/10/2018   Procedure: BROW LIFT;  Surgeon: Wallace Going, DO;  Location: [facility];  Service:  Plastics;  Laterality: Bilateral;   BUNIONECTOMY     R 12/08, L 2004 (Dr. Janus Molder)   [facility]   bilateral   CATARACT EXTRACTION Bilateral Left in 08/2012, Right 04/2016   Dr.Hecker   CESAREAN SECTION     x2   CHOLECYSTECTOMY  1992   CLOSED REDUCTION NASAL FRACTURE N/A 08/29/2018   Procedure: CLOSED REDUCTION NASAL FRACTURE;  Surgeon: Wallace Going, DO;  Location: [facility];  Service: Plastics;  Laterality: N/A;  1 hour, please   COLONOSCOPY  2006   COLONOSCOPY  01/2012   due again 01/2022; mild diverticulosis   epidural steroid injection, back  02/2010   HIP SURGERY     right bursectomy x 3   HIP SURGERY Right 2000   torn cartilage, repaired   INGUINAL HERNIA REPAIR  9/09   bilat   NECK SURGERY  1989   c6-7 cervical laminectomy and diskecotmy   NECK SURGERY  1989   per patient posterior area of neck   REVERSE SHOULDER ARTHROPLASTY Right 04/11/2019   Procedure: REVERSE SHOULDER ARTHROPLASTY;  Surgeon: Nicholes Stairs, MD;  Location: [facility];  Service: Orthopedics;  Laterality: Right;  Regional Block   SHOULDER SURGERY  2/05   left rotator cuff repair   SHOULDER SURGERY Left 03/14/2018   rotator cuff repair; Dr. Tonita Cong   TONSILLECTOMY  age 51   [place]  fibroids   TROCHANTERIC BURSA EXCISION Right 1978   TUBAL LIGATION  1986   WISDOM TOOTH EXTRACTION     Current Outpatient Medications on File Prior to Visit  Medication Sig Dispense Refill   acetaminophen (TYLENOL) 500 MG tablet Take 1,000 mg by mouth every 6 (six) hours as needed for moderate pain.      albuterol (PROVENTIL HFA;VENTOLIN HFA) 108 (90 Base) MCG/ACT inhaler Inhale 2 puffs into the lungs every 6 (six) hours as needed for wheezing or shortness of breath. 18 g 1   aspirin EC 81 MG tablet Take 81 mg by mouth daily.     atorvastatin (LIPITOR) 40 MG tablet TAKE 1 TABLET BY MOUTH DAILY. 90  tablet 1   BELBUCA 450 MCG FILM      Calcium Carbonate-Vitamin D 600-200 MG-UNIT TABS Take 1 tablet by mouth 2 (two) times daily.     celecoxib (CELEBREX) 200 MG capsule TAKE 1 CAPSULE BY MOUTH TWICE DAILY 180 capsule 1   diclofenac Sodium (VOLTAREN) 1 % GEL Apply 2 g topically 4 (four) times daily as needed (joint pain).      esomeprazole (NEXIUM) 40 MG capsule TAKE 1 CAPSULE BY MOUTH DAILY IN THE MORNING 90 capsule 0   famotidine (PEPCID) 40 MG tablet Take 1 tablet (40 mg total) by mouth at bedtime. 90 tablet 1   gabapentin (NEURONTIN) 800 MG tablet Take 800 mg by mouth at bedtime.     GUAIFENESIN ER PO Take 400 mg by mouth 2 (two) times daily.     HIZENTRA 10 GM/50ML SOLN      HYDROcodone-acetaminophen (NORCO) 7.5-325 MG tablet Take 1 tablet by mouth every 12 (twelve) hours as needed for moderate pain. Dr Hardin Negus     Melatonin-Pyridoxine (MELATIN PO)      methocarbamol (ROBAXIN) 500 MG tablet Take 500 mg by mouth in the morning and at bedtime.      montelukast (SINGULAIR) 10 MG tablet Take 1 tablet (10 mg total) by mouth at bedtime. 90 tablet 1   Multiple Vitamins-Minerals (MULTIVITAMIN WITH MINERALS) tablet Take 1 tablet by mouth daily.     omega-3 acid ethyl esters (LOVAZA) 1 g capsule TAKE 2 CAPSULES BY MOUTH TWO TIMES DAILY 180 capsule 1   ondansetron (ZOFRAN) 4 MG tablet Take 1 tablet (4 mg total) by mouth every 8 (eight) hours as needed for nausea or vomiting. 60 tablet 2   PARoxetine (PAXIL) 40 MG tablet Take 60 mg by mouth daily.     Respiratory Therapy Supplies (FLUTTER) DEVI 1 puff by Does not apply route 2 (two) times daily. 1 each 0   vancomycin (VANCOCIN) 125 MG capsule Take 1 capsule (125 mg total) by mouth 4 (four) times daily for 10 days. 40 capsule 0   Fiber Adult Gummies 2 g CHEW  (Patient not taking: Reported on 03/12/2020)     Fluorouracil 4 % CREA Apply 1 application topically every 3 (three) days. Dr Denna Haggard (Patient not taking: Reported on 03/12/2020)      loperamide (IMODIUM A-D) 2 MG tablet Take 2 mg by mouth 4 (four) times daily as needed for diarrhea or loose stools. (Patient not taking: Reported on 03/12/2020)     niacinamide 500 MG tablet Take 500 mg by mouth 2 (two) times daily with a meal. (Patient not taking: Reported on 03/12/2020)     sodium chloride HYPERTONIC 3 % nebulizer solution Take by nebulization in the morning and at bedtime. (Patient not taking: Reported on 03/12/2020) 750 mL 12  triamcinolone (NASACORT) 55 MCG/ACT AERO nasal inhaler Place 2 sprays into the nose daily. (Patient not taking: Reported on 03/12/2020)     No current facility-administered medications on file prior to visit.    Allergies  Allergen Reactions   Adhesive [Tape] Rash   Codeine Rash   Social History   Socioeconomic History   Marital status: Divorced    Spouse name: Not on file   Number of children: 2   Years of education: Not on file   Highest education level: Not on file  Occupational History   Occupation: STAFF NURSE    Employer: [employer]  Tobacco Use   Smoking status: Never Smoker   Smokeless tobacco: Never Used  Vaping Use   Vaping Use: Never used  Substance and Sexual Activity   Alcohol use: Yes    Alcohol/week: 7.0 standard drinks    Types: 7 Glasses of wine per week    Comment: 1 glass of wine daily   Drug use: No   Sexual activity: Not Currently  Other Topics Concern   Not on file  Social History Narrative   Divorced, lives alone, [place]; 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   [place] for [employer] part-time   Social Determinants of Health   Financial Resource Strain: Not on file  Food Insecurity: Not on file  Transportation Needs: Not on file  Physical Activity: Not on file  Stress: Not on file  Social Connections: Not on file  Intimate Partner  Violence: Not on file    Vitals Wt 147 lb (66.7 kg)    BMI 24.46 kg/m   Examination  General - not in acute distress, comfortably sitting in chair HEENT - PEERLA, no pallor and no icterus Chest - b/l clear air entry, no additional sounds CVS- Normal s1s2, RRR Abdomen - Soft, Non tender , non distended, BS HYPERACTIVE  Ext- no pedal edema Neuro: grossly normal Back - WNL Psych : calm and cooperative   Recent labs CBC Latest Ref Rng & Units 02/20/2020 08/22/2019 06/22/2019  WBC 3.8 - 10.8 Thousand/uL 5.0 4.5 10.0  Hemoglobin 11.7 - 15.5 g/dL 11.8 13.3 14.1  Hematocrit 35.0 - 45.0 % 36.1 40.3 44.8  Platelets 140 - 400 Thousand/uL 144 - 208   CMP Latest Ref Rng & Units 02/20/2020 02/06/2020 01/10/2020  Glucose 65 - 99 mg/dL 70 70 68  BUN 7 - 25 mg/dL 19 15 13   Creatinine 0.60 - 0.93 mg/dL 0.92 0.74 0.85  Sodium 135 - 146 mmol/L 139 142 140  Potassium 3.5 - 5.3 mmol/L 3.9 4.0 4.3  Chloride 98 - 110 mmol/L 105 108 106  CO2 20 - 32 mmol/L 28 29 28   Calcium 8.6 - 10.4 mg/dL 9.0 8.9 9.3  Total Protein 6.1 - 8.1 g/dL 6.2 - -  Total Bilirubin 0.2 - 1.2 mg/dL 0.3 - -  Alkaline Phos 39 - 117 IU/L - - -  AST 10 - 35 U/L 32 - -  ALT 6 - 29 U/L 67(H) - -     Pertinent Microbiology Results for orders placed or performed in visit on 03/01/20   MYCOBACTERIA, CULTURE, WITH FLUOROCHROME SMEAR     Status: None (Preliminary result)   Collection Time: 03/01/20 10:03 AM   Specimen: Sputum  Result Value Ref Range Status   MICRO NUMBER: [id number]  Preliminary   SPECIMEN QUALITY: Adequate  Preliminary  Source: SPUTUM  Preliminary   STATUS: PRELIMINARY  Preliminary   SMEAR: No acid fast bacilli seen.  Preliminary   RESULT:   Preliminary    Culture results to follow. Final reports of negative cultures can be expected in approximately six weeks. Positive cultures are reported immediately.    All pertinent labs/Imagings/notes reviewed. All pertinent plain films and CT images have been personally  visualized and interpreted; radiology reports have been reviewed. Decision making incorporated into the Impression / Recommendations.  I spent greater than 25 minutes with the patient including  review of prior medical records with greater than 50% of time in face to face counsel of the patient.    Electronically signed by:  Rosiland Oz, MD Infectious Disease Physician [facility] [address] [place], [place] [address] Phone: [phone]   Fax: [phone]

## 2020-03-13 ENCOUNTER — Other Ambulatory Visit: Payer: Self-pay | Admitting: Infectious Diseases

## 2020-03-13 ENCOUNTER — Telehealth: Payer: Self-pay

## 2020-03-13 DIAGNOSIS — R197 Diarrhea, unspecified: Secondary | ICD-10-CM | POA: Insufficient documentation

## 2020-03-13 LAB — COMPREHENSIVE METABOLIC PANEL
AG Ratio: 1.6 (calc) (ref 1.0–2.5)
ALT: 20 U/L (ref 6–29)
AST: 27 U/L (ref 10–35)
Albumin: 3.6 g/dL (ref 3.6–5.1)
Alkaline phosphatase (APISO): 94 U/L (ref 37–153)
BUN: 13 mg/dL (ref 7–25)
CO2: 28 mmol/L (ref 20–32)
Calcium: 9.2 mg/dL (ref 8.6–10.4)
Chloride: 104 mmol/L (ref 98–110)
Creat: 0.73 mg/dL (ref 0.60–0.93)
Globulin: 2.2 g/dL (calc) (ref 1.9–3.7)
Glucose, Bld: 69 mg/dL (ref 65–99)
Potassium: 3.3 mmol/L — ABNORMAL LOW (ref 3.5–5.3)
Sodium: 141 mmol/L (ref 135–146)
Total Bilirubin: 0.2 mg/dL (ref 0.2–1.2)
Total Protein: 5.8 g/dL — ABNORMAL LOW (ref 6.1–8.1)

## 2020-03-13 LAB — CBC
HCT: 35.6 % (ref 35.0–45.0)
Hemoglobin: 11.8 g/dL (ref 11.7–15.5)
MCH: 32 pg (ref 27.0–33.0)
MCHC: 33.1 g/dL (ref 32.0–36.0)
MCV: 96.5 fL (ref 80.0–100.0)
MPV: 11.4 fL (ref 7.5–12.5)
Platelets: 171 10*3/uL (ref 140–400)
RBC: 3.69 10*6/uL — ABNORMAL LOW (ref 3.80–5.10)
RDW: 13.7 % (ref 11.0–15.0)
WBC: 7.9 10*3/uL (ref 3.8–10.8)

## 2020-03-13 LAB — CLOSTRIDIUM DIFFICILE TOXIN B, QUALITATIVE, REAL-TIME PCR: Toxigenic C. Difficile by PCR: NOT DETECTED

## 2020-03-13 MED ORDER — POTASSIUM CHLORIDE ER 10 MEQ PO TBCR: EXTENDED_RELEASE_TABLET | ORAL | 0 refills | Status: DC

## 2020-03-13 MED FILL — POTASSIUM CL ER 10 MEQ TAB: 10 | 1 days supply | Qty: 2 | Fill #0

## 2020-03-13 NOTE — Assessment & Plan Note (Signed)
Hold bactrim Continue PO Vancomycin for 14 days Re-check for C diff

## 2020-03-13 NOTE — Telephone Encounter (Signed)
-----   Message from Rosiland Oz, MD sent at 03/13/2020 11:56 AM EST ----- Regarding: RE: Labs Potassium Chloride 20 MEQV one table once   ----- Message ----- From: Eugenia Mcalpine, LPN Sent: [id number]/[id number]  11:30 AM EST To: Rosiland Oz, MD Subject: FW: Labs                                       Patient agrees to potassium. I can send rx Potassium Chloride 10 MEQ take one tablet  by mouth once? ----- Message ----- From: Rosiland Oz, MD Sent: 03/13/2020  11:21 AM EST To: [facility] Triage Nurse Pool Subject: Labs                                           Please let her know that her labs are all WNL except her potassium is low at 3.3. I can send her pill of potassium chloride if she would like.

## 2020-03-13 NOTE — Assessment & Plan Note (Signed)
On bactrim, CBC and CMP today

## 2020-03-13 NOTE — Assessment & Plan Note (Signed)
Will hold off on bactrim given ongoing diarrhea Fu in a month

## 2020-03-13 NOTE — Telephone Encounter (Signed)
-----   Message from Rosiland Oz, MD sent at 03/13/2020 11:19 AM EST ----- Regarding: Labs Please let her know that her labs are all WNL except her potassium is low at 3.3. I can send her pill of potassium chloride if she would like.

## 2020-03-13 NOTE — Telephone Encounter (Signed)
Patient made aware of results and agrees to Dr. West Bali suggestion to take potassium chloride supplement.  Received verbal order per Dr. West Bali For patient to take potassium chloride 20 MEQ by mouth once.  Eugenia Mcalpine

## 2020-03-14 ENCOUNTER — Encounter: Payer: Self-pay | Admitting: Internal Medicine

## 2020-03-15 ENCOUNTER — Telehealth: Payer: Self-pay | Admitting: Allergy and Immunology

## 2020-03-15 NOTE — Telephone Encounter (Signed)
Dr. Neldon Mc please advice regarding patient's concern. Thank you

## 2020-03-15 NOTE — Telephone Encounter (Signed)
Patient is on infusions. She has had the Covid vaccine booster. She wants to know if she might need to get a second booster due to her being immunocompromised.

## 2020-03-18 NOTE — Telephone Encounter (Signed)
Patient called back and was advised of Dr Bruna Potter instruction for fourth vaccine

## 2020-03-18 NOTE — Telephone Encounter (Signed)
Left voicemail for patient to call back. 

## 2020-03-18 NOTE — Telephone Encounter (Signed)
Please inform Beverly [name] that it would be fine to receive a fourth COVID vaccine as long as she is done pretty well with the initial 3 vaccines.  There may be some protection directed against Covid with her immunoglobulin infusions for that immunoglobulin comes from tens of thousands of individuals pulled together and some of them probably have anti-Covid antibodies.

## 2020-03-19 ENCOUNTER — Ambulatory Visit: Payer: Medicare PPO | Admitting: Infectious Diseases

## 2020-03-20 ENCOUNTER — Telehealth: Payer: Self-pay

## 2020-03-20 NOTE — Telephone Encounter (Signed)
-----   Message from Rosiland Oz, MD sent at 03/19/2020  8:11 PM EST ----- Regarding: Follow up Could you please let her know that her c diff test is negative?   Please also ask her how he is diarrhea now that she is off bactrim?

## 2020-03-20 NOTE — Telephone Encounter (Signed)
Patient states she feels better and not having as many stools during the day. Still having lots of gas, but no diarrhea. Reminded patient of her upcoming appointment   Beverly [name]

## 2020-03-20 NOTE — Telephone Encounter (Signed)
Thanks

## 2020-03-22 ENCOUNTER — Ambulatory Visit (INDEPENDENT_AMBULATORY_CARE_PROVIDER_SITE_OTHER): Payer: Medicare PPO

## 2020-03-22 ENCOUNTER — Encounter: Payer: Self-pay | Admitting: *Deleted

## 2020-03-22 ENCOUNTER — Other Ambulatory Visit: Payer: Self-pay

## 2020-03-22 DIAGNOSIS — Z23 Encounter for immunization: Secondary | ICD-10-CM | POA: Diagnosis not present

## 2020-03-22 NOTE — Progress Notes (Signed)
   Covid-19 Vaccination Clinic  Name:  Beverly [name]    MRN: [id number] DOB: [birth year]  03/22/2020  [name] was observed post Covid-19 immunization for 15 minutes without incident. She was provided with Vaccine Information Sheet and instruction to access the V-Safe system.   [name] was instructed to call 911 with any severe reactions post vaccine: Marland Kitchen Difficulty breathing  . Swelling of face and throat  . A fast heartbeat  . A bad rash all over body  . Dizziness and weakness   Immunizations Administered    Name Date Dose VIS Date Route   PFIZER Comrnaty(Gray TOP) Covid-19 Vaccine 03/22/2020 10:05 AM 0.3 mL 01/25/2020 Intramuscular   Manufacturer: [place]   Lot: [id number]   NDC: [phone]     Landis Gandy, RN

## 2020-03-26 ENCOUNTER — Other Ambulatory Visit (HOSPITAL_COMMUNITY): Payer: Self-pay | Admitting: Anesthesiology

## 2020-03-26 DIAGNOSIS — Z79891 Long term (current) use of opiate analgesic: Secondary | ICD-10-CM | POA: Diagnosis not present

## 2020-03-26 DIAGNOSIS — M150 Primary generalized (osteo)arthritis: Secondary | ICD-10-CM | POA: Diagnosis not present

## 2020-03-26 DIAGNOSIS — G894 Chronic pain syndrome: Secondary | ICD-10-CM | POA: Diagnosis not present

## 2020-03-26 DIAGNOSIS — M47816 Spondylosis without myelopathy or radiculopathy, lumbar region: Secondary | ICD-10-CM | POA: Diagnosis not present

## 2020-03-26 MED FILL — BELBUCA 600 MCG FILM: 600 | 30 days supply | Qty: 60 | Fill #0

## 2020-03-27 ENCOUNTER — Telehealth (INDEPENDENT_AMBULATORY_CARE_PROVIDER_SITE_OTHER): Payer: Medicare PPO | Admitting: Infectious Diseases

## 2020-03-27 ENCOUNTER — Other Ambulatory Visit: Payer: Self-pay

## 2020-03-27 ENCOUNTER — Encounter: Payer: Self-pay | Admitting: Infectious Diseases

## 2020-03-27 DIAGNOSIS — Z114 Encounter for screening for human immunodeficiency virus [HIV]: Secondary | ICD-10-CM

## 2020-03-27 DIAGNOSIS — A439 Nocardiosis, unspecified: Secondary | ICD-10-CM

## 2020-03-27 DIAGNOSIS — R197 Diarrhea, unspecified: Secondary | ICD-10-CM | POA: Diagnosis not present

## 2020-03-27 NOTE — Progress Notes (Addendum)
Virtual Visit via Telephone Note  I connected with@ on 03/27/20 at  3:45 PM EST by a telephone enabled telemedicine application and verified that I am speaking with the correct person using two identifiers.  Location: Home  Patient: Beverly [name] Provider: [facility]   I discussed the limitations of evaluation and management by telemedicine and the availability of in person appointments. The patient expressed understanding and agreed to proceed.  [facility]  Patient Active Problem List   Diagnosis Date Noted   Diarrhea 03/13/2020   Acid-fast bacteria present 02/20/2020   Nocardia infection 01/11/2020   Medication monitoring encounter 01/11/2020   OSA (obstructive sleep apnea) 07/31/2019   Chest pain 06/22/2019   Interstitial lung disease ([place]) 06/22/2019   S/P reverse total shoulder arthroplasty, right 04/11/2019   Aortic atherosclerosis ([place]) 04/10/2019   Brow ptosis 07/28/2018   Facial trauma 07/28/2018   Nasal fracture 07/28/2018   Abnormal lung sounds 10/16/2016   Moderate persistent asthma 09/18/2016   Transient visual loss of both eyes 03/16/2016   Amaurosis fugax 01/09/2015   Postmenopausal symptoms 11/26/2014   Left shoulder pain 10/02/2014   Right hip pain 09/05/2014   Unspecified asthma, with exacerbation 04/25/2012   GERD (gastroesophageal reflux disease) 04/06/2011   Asthmatic bronchitis 04/06/2011   Allergic rhinitis 04/06/2011   Mixed hyperlipidemia 04/06/2011   Chronic back pain 04/06/2011   Cough 02/20/2011    Patient's Medications  New Prescriptions   No medications on file  Previous Medications   ACETAMINOPHEN (TYLENOL) 500 MG TABLET    Take 1,000 mg by mouth every 6 (six) hours as needed for moderate pain.    ALBUTEROL (PROVENTIL HFA;VENTOLIN HFA) 108 (90 BASE) MCG/ACT INHALER    Inhale 2 puffs into the lungs every 6 (six) hours as needed for wheezing or shortness of breath.   ASPIRIN EC 81 MG TABLET     Take 81 mg by mouth daily.   ATORVASTATIN (LIPITOR) 40 MG TABLET    TAKE 1 TABLET BY MOUTH DAILY.   BELBUCA 450 MCG FILM       CALCIUM CARBONATE-VITAMIN D 600-200 MG-UNIT TABS    Take 1 tablet by mouth 2 (two) times daily.   CELECOXIB (CELEBREX) 200 MG CAPSULE    TAKE 1 CAPSULE BY MOUTH TWICE DAILY   DICLOFENAC SODIUM (VOLTAREN) 1 % GEL    Apply 2 g topically 4 (four) times daily as needed (joint pain).    ESOMEPRAZOLE (NEXIUM) 40 MG CAPSULE    TAKE 1 CAPSULE BY MOUTH DAILY IN THE MORNING   FAMOTIDINE (PEPCID) 40 MG TABLET    Take 1 tablet (40 mg total) by mouth at bedtime.   FIBER ADULT GUMMIES 2 G CHEW       FLUOROURACIL 4 % CREA    Apply 1 application topically every 3 (three) days. Dr Denna Haggard   GABAPENTIN (NEURONTIN) 800 MG TABLET    Take 800 mg by mouth at bedtime.   GUAIFENESIN ER PO    Take 400 mg by mouth 2 (two) times daily.   HIZENTRA 10 GM/50ML SOLN       HYDROCODONE-ACETAMINOPHEN (NORCO) 7.5-325 MG TABLET    Take 1 tablet by mouth every 12 (twelve) hours as needed for moderate pain. Dr Hardin Negus   LOPERAMIDE (IMODIUM A-D) 2 MG TABLET    Take 2 mg by mouth 4 (four) times daily as needed for diarrhea or loose stools.   MELATONIN-PYRIDOXINE (MELATIN PO)       METHOCARBAMOL (ROBAXIN) 500 MG TABLET  Take 500 mg by mouth in the morning and at bedtime.    MONTELUKAST (SINGULAIR) 10 MG TABLET    Take 1 tablet (10 mg total) by mouth at bedtime.   MULTIPLE VITAMINS-MINERALS (MULTIVITAMIN WITH MINERALS) TABLET    Take 1 tablet by mouth daily.   NIACINAMIDE 500 MG TABLET    Take 500 mg by mouth 2 (two) times daily with a meal.   OMEGA-3 ACID ETHYL ESTERS (LOVAZA) 1 G CAPSULE    TAKE 2 CAPSULES BY MOUTH TWO TIMES DAILY   ONDANSETRON (ZOFRAN) 4 MG TABLET    Take 1 tablet (4 mg total) by mouth every 8 (eight) hours as needed for nausea or vomiting.   PAROXETINE (PAXIL) 40 MG TABLET    Take 60 mg by mouth daily.   POTASSIUM CHLORIDE (KLOR-CON) 10 MEQ TABLET    Take 2 tablets (total 20 MEQ) by  mouth once   RESPIRATORY THERAPY SUPPLIES (FLUTTER) DEVI    1 puff by Does not apply route 2 (two) times daily.   SODIUM CHLORIDE HYPERTONIC 3 % NEBULIZER SOLUTION    Take by nebulization in the morning and at bedtime.   TRIAMCINOLONE (NASACORT) 55 MCG/ACT AERO NASAL INHALER    Place 2 sprays into the nose daily.  Modified Medications   No medications on file  Discontinued Medications   No medications on file    History of Present Illness: SEBRENA ENGH a 75 year old female with a history of mild persistent asthma,GERD,Bronchiectasis, OSA, Tracheomalacia, Primary immunodeficiency on immunoglobulin therapy, Rt reverse shoulder arthroplasty, skin malignancy here for follow up of Nocardia PNA and diarrhea. She has completed approx 5 months of bactrim therapy for Nocardia PNA. Bactrim was stopped last visit due to diarrhea that was thought to be related to bactrim. C diff was negative.   She says her diarrhea got better since the bactrim was stopped and was doing well until this past Monday when she had few episodes of loose stool and started having solid stool from Tuesday. She says she is also going through some family issues and unsure if the diarrhea is related to it as it only lasted for 1 day. She also says that she saw some blood in the stool. She says last colonoscopy was 7 years ago. Denies any fevers, chills, sweats, abdominal cramps. Denies nausea, vomiting. Appetite is good. No new medicine or new food intake.   In terms of respiratory standpoint, she says cough is better 2-3 times a day. Phlegm is also less. SOB has been stable. She get SOB when talking for a long period of time.   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   right sholder   BCC (basal cell carcinoma of skin) 05/09/2014   mid lower back   BCC (basal cell carcinoma of skin) 05/03/2017   right low back   BCC (basal cell carcinoma of skin) 05/03/2017   left upper back   BCC (basal cell carcinoma of skin) 07/05/2018   left mid back   BCC (basal cell carcinoma of skin) 05/20/1992   upper back   BCC (basal cell carcinoma of skin) 07/29/1993   left sholder medial   BCC (  basal cell carcinoma of skin) 07/29/1993   left sholder lateral   BCC (basal cell carcinoma of skin) 07/29/1993   right thigh   BCC (basal cell carcinoma of skin) 07/29/1993   right sholder   BCC (basal cell carcinoma of skin) 12/22/1994   right mid forearm   BCC (basal cell carcinoma of skin) 12/22/1994   right upper forearm   BCC (basal cell carcinoma of skin) 12/22/1994   lower right upper arm   BCC (basal cell carcinoma of skin) 12/22/1994   right upper arm sholder   BCC (basal cell carcinoma of skin) 08/11/1995   left leg below knee   BCC (basal cell carcinoma of skin) 04/11/2002   mid back   BCC (basal cell carcinoma of skin) 12/10/2002   right center upper back   BCC (basal cell carcinoma of skin) 05/26/2005   right post sholder   BCC (basal cell carcinoma) 05/09/2014   left inner shin   BCC (basal cell carcinoma) 06/12/2014   left forearm   Bowen's disease 10/07/1994   right post knee, right inner forearm/wrist   Bowen's disease 08/11/1995   left sholder   Cataract    left   Chronic pain    Depression    Diverticulosis of colon 1998   mild   DJD (degenerative joint disease)    Duodenal ulcer    h/o   GERD (gastroesophageal reflux disease)    History of SCC (squamous cell carcinoma) of skin    Dr. Denna Haggard   History of sinus bradycardia    HLD (hyperlipidemia)    hypertriglyceridemia   Hypertensive retinopathy of both eyes 11/18/2017   IBS (irritable bowel syndrome)    Internal hemorrhoids 1998    Mitral regurgitation    mild   Osteoarthritis    feet,shoulder,neck,back,hips and hands.   Pneumonia    PONV (postoperative nausea and vomiting)    Rotator cuff tear, right 02/2019   infraspinatus and supraspinatus, and dislocation of long head of bicep tendon (Dr. Alvan Dame)   SCC (squamous cell carcinoma) 11/26/2014   left hand, right hand, right deltoid   SCC (squamous cell carcinoma) 05/03/2017   left cheek   Sleep apnea    uses a mouth guard nightly   Squamous cell carcinoma in situ (SCCIS) 07/05/2018   left hand   Vitamin D deficiency    mild    Social History   Tobacco Use   Smoking status: Never Smoker   Smokeless tobacco: Never Used  Vaping Use   Vaping Use: Never used  Substance Use Topics   Alcohol use: Yes    Alcohol/week: 7.0 standard drinks    Types: 7 Glasses of wine per week    Comment: 1 glass of wine daily   Drug use: No    Family History  Problem Relation Age of Onset   Depression Mother    Schizophrenia Mother    Cancer Father        oral   Heart disease Father        bradycardia   Cancer Sister        skin and lung   COPD Sister    Hypertension Sister    Osteoporosis Sister        compression fx's x 3 11/2018   Hyperthyroidism Brother    Cancer Brother        metastatic cancer to bone (?primary)   Hyperlipidemia Daughter    Asthma Son    Cancer Paternal Grandmother 24  colon cancer, metastatic to liver   Colon cancer Paternal Grandmother    Diabetes Maternal Grandmother    ADD / ADHD Other     Allergies  Allergen Reactions   Bactrim [Sulfamethoxazole-Trimethoprim] Diarrhea and Nausea And Vomiting    Severe nausea, vomiting, and diarrhea   Adhesive [Tape] Rash   Codeine Rash    Health Maintenance  Topic Date Due   MAMMOGRAM  01/14/2022   COLONOSCOPY (Pts 45-85yrs Insurance coverage will need to be confirmed)  01/21/2022   TETANUS/TDAP  10/07/2024   INFLUENZA VACCINE  Completed   DEXA  SCAN  Completed   COVID-19 Vaccine  Completed   Hepatitis C Screening  Completed   PNA vac Low Risk Adult  Completed    Observations/Objective: Phone visit   Assessment and Plan: Diarrhea, antibiotic related vs others : C diff is negative Nocardia PNA - s/p 5 months of Bactrim, I will hold off on switching to alternative antibiotics like azithromycin or linezolid given her GI issues  Follow Up Instructions: Will referral to GI for diarrhea. Blood in stool Orders Placed This Encounter  Procedures   Stool Culture   Fecal leukocytes   Ova and parasite examination   Giardia antigen   Cryptosporidium Smear, Fecal   Gastrointestinal Pathogen Panel PCR   Ambulatory referral to Gastroenterology   I discussed the assessment and treatment plan with the patient. The patient was provided an opportunity to ask questions and all were answered. The patient agreed with the plan and demonstrated an understanding of the instructions.   The patient was advised to call back or seek an in-person evaluation if the symptoms worsen or if the condition fails to improve as anticipated.  I provided 20 minutes of non-face-to-face time during this encounter.  Wilber Oliphant, [facility] Phone [phone] Fax no. [phone]  03/27/2020, 2:54 PM

## 2020-03-27 NOTE — Assessment & Plan Note (Signed)
Will do stool studies as ordered GU referral

## 2020-03-27 NOTE — Assessment & Plan Note (Signed)
S/p 5 months of bactrim treatment

## 2020-03-28 ENCOUNTER — Other Ambulatory Visit: Payer: Self-pay

## 2020-03-28 ENCOUNTER — Other Ambulatory Visit: Payer: Medicare PPO

## 2020-03-28 DIAGNOSIS — Z5181 Encounter for therapeutic drug level monitoring: Secondary | ICD-10-CM

## 2020-03-28 DIAGNOSIS — Z114 Encounter for screening for human immunodeficiency virus [HIV]: Secondary | ICD-10-CM

## 2020-03-28 NOTE — Addendum Note (Signed)
Addended by: Rosiland Oz on: 03/28/2020 09:18 AM   Modules accepted: Orders

## 2020-03-28 NOTE — Addendum Note (Signed)
Addended by: Caffie Pinto on: 03/28/2020 09:23 AM   Modules accepted: Orders

## 2020-03-28 NOTE — Addendum Note (Signed)
Addended by: Rosiland Oz on: 03/28/2020 09:12 AM   Modules accepted: Orders

## 2020-03-28 NOTE — Addendum Note (Signed)
Addended by: Rosiland Oz on: 03/28/2020 09:05 AM   Modules accepted: Orders

## 2020-03-29 ENCOUNTER — Other Ambulatory Visit: Payer: Self-pay

## 2020-03-29 ENCOUNTER — Other Ambulatory Visit: Payer: Self-pay | Admitting: Internal Medicine

## 2020-03-29 ENCOUNTER — Telehealth: Payer: Self-pay

## 2020-03-29 DIAGNOSIS — E876 Hypokalemia: Secondary | ICD-10-CM

## 2020-03-29 DIAGNOSIS — J309 Allergic rhinitis, unspecified: Secondary | ICD-10-CM

## 2020-03-29 DIAGNOSIS — J45998 Other asthma: Secondary | ICD-10-CM

## 2020-03-29 LAB — CBC
HCT: 35.9 % (ref 35.0–45.0)
Hemoglobin: 11.8 g/dL (ref 11.7–15.5)
MCH: 32.3 pg (ref 27.0–33.0)
MCHC: 32.9 g/dL (ref 32.0–36.0)
MCV: 98.4 fL (ref 80.0–100.0)
MPV: 12 fL (ref 7.5–12.5)
Platelets: 123 10*3/uL — ABNORMAL LOW (ref 140–400)
RBC: 3.65 10*6/uL — ABNORMAL LOW (ref 3.80–5.10)
RDW: 13.3 % (ref 11.0–15.0)
WBC: 4.3 10*3/uL (ref 3.8–10.8)

## 2020-03-29 LAB — HIV ANTIBODY (ROUTINE TESTING W REFLEX): HIV 1&2 Ab, 4th Generation: NONREACTIVE

## 2020-03-29 MED ORDER — POTASSIUM CHLORIDE ER 10 MEQ PO TBCR: EXTENDED_RELEASE_TABLET | ORAL | 0 refills | Status: DC

## 2020-03-29 MED FILL — MONTELUKAST SOD 10 MG TAB: 10 | 90 days supply | Qty: 90 | Fill #0

## 2020-03-29 MED FILL — POTASSIUM CL ER 10 MEQ TAB: 10 | 1 days supply | Qty: 2 | Fill #0

## 2020-03-29 NOTE — Telephone Encounter (Signed)
Patient notified of hypokalemia and potassium pills have been prescribed. Patient requested them to be sent to [facility] outpatient pharmacy.   Suleika Donavan Lorita Officer, RN

## 2020-04-01 ENCOUNTER — Other Ambulatory Visit: Payer: Self-pay

## 2020-04-01 ENCOUNTER — Other Ambulatory Visit: Payer: Medicare PPO

## 2020-04-01 ENCOUNTER — Encounter: Payer: Self-pay | Admitting: Nurse Practitioner

## 2020-04-01 DIAGNOSIS — R197 Diarrhea, unspecified: Secondary | ICD-10-CM | POA: Diagnosis not present

## 2020-04-03 DIAGNOSIS — M65312 Trigger thumb, left thumb: Secondary | ICD-10-CM | POA: Diagnosis not present

## 2020-04-03 DIAGNOSIS — G5602 Carpal tunnel syndrome, left upper limb: Secondary | ICD-10-CM | POA: Diagnosis not present

## 2020-04-04 LAB — SALMONELLA/SHIGELLA CULT, CAMPY EIA AND SHIGA TOXIN RFL ECOLI
MICRO NUMBER: 11532029
MICRO NUMBER:: 11532030
MICRO NUMBER:: 11532031
Result:: NOT DETECTED
SHIGA RESULT:: NOT DETECTED
SPECIMEN QUALITY: ADEQUATE
SPECIMEN QUALITY:: ADEQUATE
SPECIMEN QUALITY:: ADEQUATE

## 2020-04-05 DIAGNOSIS — J479 Bronchiectasis, uncomplicated: Secondary | ICD-10-CM | POA: Diagnosis not present

## 2020-04-05 DIAGNOSIS — J310 Chronic rhinitis: Secondary | ICD-10-CM | POA: Diagnosis not present

## 2020-04-05 DIAGNOSIS — G4733 Obstructive sleep apnea (adult) (pediatric): Secondary | ICD-10-CM | POA: Diagnosis not present

## 2020-04-06 LAB — MYCOBACTERIA,CULT W/FLUOROCHROME SMEAR
MICRO NUMBER:: 11388303
SMEAR:: NONE SEEN
SPECIMEN QUALITY:: ADEQUATE

## 2020-04-09 LAB — OVA AND PARASITE EXAMINATION
CONCENTRATE RESULT:: NONE SEEN
MICRO NUMBER:: 11531533
SPECIMEN QUALITY:: ADEQUATE
TRICHROME RESULT:: NONE SEEN

## 2020-04-09 LAB — GIARDIA AND CRYPTOSPORIDIUM ANTIGEN PANEL
MICRO NUMBER:: 11531517
RESULT:: NOT DETECTED
SPECIMEN QUALITY:: ADEQUATE
Specimen Quality:: ADEQUATE
micro Number:: 11531516

## 2020-04-09 LAB — FECAL LEUKOCYTES
Micro Number: 11531524
Result: NOT DETECTED
Specimen Quality: ADEQUATE

## 2020-04-09 LAB — GASTROINTESTINAL PATHOGEN PANEL PCR

## 2020-04-10 ENCOUNTER — Other Ambulatory Visit: Payer: Self-pay | Admitting: Allergy and Immunology

## 2020-04-11 ENCOUNTER — Other Ambulatory Visit: Payer: Self-pay | Admitting: Allergy and Immunology

## 2020-04-11 MED ORDER — ESOMEPRAZOLE MAGNESIUM 40 MG PO CPDR: DELAYED_RELEASE_CAPSULE | ORAL | 1 refills | Status: DC

## 2020-04-11 MED FILL — ESOMEPRAZOLE MAG DR 40 MG C: 40 | 90 days supply | Qty: 90 | Fill #0

## 2020-04-11 MED FILL — METHOCARBAMOL 500 MG TABS: 500 | 60 days supply | Qty: 180 | Fill #2

## 2020-04-15 DIAGNOSIS — Z96611 Presence of right artificial shoulder joint: Secondary | ICD-10-CM | POA: Diagnosis not present

## 2020-04-15 DIAGNOSIS — G5602 Carpal tunnel syndrome, left upper limb: Secondary | ICD-10-CM | POA: Diagnosis not present

## 2020-04-16 ENCOUNTER — Ambulatory Visit: Payer: Medicare PPO | Admitting: Allergy and Immunology

## 2020-04-16 ENCOUNTER — Other Ambulatory Visit: Payer: Self-pay

## 2020-04-16 ENCOUNTER — Ambulatory Visit: Payer: Medicare PPO | Admitting: Allergy & Immunology

## 2020-04-16 ENCOUNTER — Other Ambulatory Visit: Payer: Self-pay | Admitting: Allergy and Immunology

## 2020-04-16 ENCOUNTER — Ambulatory Visit: Payer: Medicare PPO | Admitting: Infectious Diseases

## 2020-04-16 VITALS — BP 120/86 | HR 63 | Temp 97.3°F | Resp 18 | Ht 64.0 in

## 2020-04-16 DIAGNOSIS — D801 Nonfamilial hypogammaglobulinemia: Secondary | ICD-10-CM | POA: Diagnosis not present

## 2020-04-16 DIAGNOSIS — D839 Common variable immunodeficiency, unspecified: Secondary | ICD-10-CM

## 2020-04-16 DIAGNOSIS — A439 Nocardiosis, unspecified: Secondary | ICD-10-CM

## 2020-04-16 DIAGNOSIS — K219 Gastro-esophageal reflux disease without esophagitis: Secondary | ICD-10-CM | POA: Diagnosis not present

## 2020-04-16 DIAGNOSIS — R197 Diarrhea, unspecified: Secondary | ICD-10-CM | POA: Diagnosis not present

## 2020-04-16 DIAGNOSIS — J479 Bronchiectasis, uncomplicated: Secondary | ICD-10-CM

## 2020-04-16 LAB — MYCOBACTERIA,CULT W/FLUOROCHROME SMEAR
MICRO NUMBER:: 11421372
SMEAR:: NONE SEEN
SPECIMEN QUALITY:: ADEQUATE

## 2020-04-16 MED ORDER — COLESTIPOL HCL 1 G PO TABS: ORAL_TABLET | ORAL | 3 refills | Status: DC

## 2020-04-16 NOTE — Progress Notes (Signed)
[facility] [place] [facility] [place]   Follow-up Note  Referring Provider: Isaac Bliss, [place] [facility]* Primary Provider: Isaac Bliss, Rayford Halsted, MD Date of Office Visit: 04/16/2020  Subjective:   Beverly [name] (DOB: [birth year]) is a 75 y.o. female who returns to the [facility] [place] on 04/16/2020 in re-evaluation of the following:  HPI: Beverly [name] returns to this clinic in evaluation of hypogammaglobulinemia/CVID, history of bronchiectasis, history of Nocardia pneumonia, and history of reflux induced respiratory disease addressed during her last evaluation of 23 January 2020.  She has done well without any infections at this point in time.  She still continues to use Bactrim for her nocardia pneumonia and the plan is to have her complete a full 6 months of bactrim.  She occasionally has some sputum production but no other significant respiratory tract symptoms.  She rarely uses any albuterol.  There was a break in her Bactrim use as she worked through an issue with diarrhea.  Her diarrhea issue apparently started in January.  She will have watery stools 10 times a day if she does not use Imodium.  She has apparently had a negative evaluation with stool tests not identifying any specific pathogen.  She is scheduled to see a gastroenterologist tomorrow.  She is status post cholecystectomy.  Her reflux appears to be under pretty good control at this point in time.  She still has regurgitation in the evening even while taking Nexium and famotidine.  Allergies as of 04/16/2020      Reactions   Bactrim [sulfamethoxazole-trimethoprim] Diarrhea, Nausea And Vomiting   Severe nausea, vomiting, and diarrhea   Adhesive [tape] Rash   Codeine Rash      Medication List    acetaminophen 500 MG tablet Commonly known as: TYLENOL Take 1,000 mg by mouth every 6 (six) hours as needed for moderate pain.   albuterol 108 (90 Base) MCG/ACT inhaler Commonly known as:  VENTOLIN HFA Inhale 2 puffs into the lungs every 6 (six) hours as needed for wheezing or shortness of breath.   aspirin EC 81 MG tablet Take 81 mg by mouth daily.   atorvastatin 40 MG tablet Commonly known as: LIPITOR TAKE 1 TABLET BY MOUTH DAILY.   Calcium Carbonate-Vitamin D 600-200 MG-UNIT Tabs Take 1 tablet by mouth 2 (two) times daily.   celecoxib 200 MG capsule Commonly known as: CELEBREX TAKE 1 CAPSULE BY MOUTH TWICE DAILY   diclofenac Sodium 1 % Gel Commonly known as: VOLTAREN Apply 2 g topically 4 (four) times daily as needed (joint pain).   esomeprazole 40 MG capsule Commonly known as: NEXIUM TAKE 1 CAPSULE BY MOUTH DAILY IN THE MORNING   famotidine 40 MG tablet Commonly known as: PEPCID Take 1 tablet (40 mg total) by mouth at bedtime.   Fluorouracil 4 % Crea Apply 1 application topically every 3 (three) days. Dr Denna Haggard   [place] 1 puff by Does not apply route 2 (two) times daily.   gabapentin 800 MG tablet Commonly known as: NEURONTIN Take 800 mg by mouth at bedtime.   GUAIFENESIN ER PO Take 400 mg by mouth 2 (two) times daily.   Hizentra 10 GM/50ML Soln Generic drug: Immune Globulin (Human)   HYDROcodone-acetaminophen 7.5-325 MG tablet Commonly known as: NORCO Take 1 tablet by mouth every 12 (twelve) hours as needed for moderate pain. Dr Hardin Negus   loperamide 2 MG tablet Commonly known as: IMODIUM A-D Take 2 mg by mouth 4 (four) times daily as needed  for diarrhea or loose stools.   MELATIN PO   methocarbamol 500 MG tablet Commonly known as: ROBAXIN Take 500 mg by mouth in the morning and at bedtime.   montelukast 10 MG tablet Commonly known as: SINGULAIR TAKE 1 TABLET BY MOUTH AT BEDTIME.   multivitamin with minerals tablet Take 1 tablet by mouth daily.   omega-3 acid ethyl esters 1 g capsule Commonly known as: LOVAZA TAKE 2 CAPSULES BY MOUTH TWO TIMES DAILY   ondansetron 4 MG tablet Commonly known as: Zofran Take 1 tablet (4 mg  total) by mouth every 8 (eight) hours as needed for nausea or vomiting.   PARoxetine 40 MG tablet Commonly known as: PAXIL Take 60 mg by mouth daily.   sodium chloride HYPERTONIC 3 % nebulizer solution Take by nebulization in the morning and at bedtime.   triamcinolone 55 MCG/ACT Aero nasal inhaler Commonly known as: NASACORT Place 2 sprays into the nose daily.       Past Medical History:  Diagnosis Date  . Allergy   . Amaurosis fugax    negative w/u through [facility]  . Asthma   . Back pain    Dr Joline Salt 02/2010-epidural injection x 2 at L4-5 with good effect  . 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 BCC (basal cell carcinoma of skin) 05/09/2014   mid lower back  . BCC (basal cell carcinoma of skin) 05/03/2017   right low back  . BCC (basal cell carcinoma of skin) 05/03/2017   left upper back  . BCC (basal cell carcinoma of skin) 07/05/2018   left mid back  . BCC (basal cell carcinoma of skin) 05/20/1992   upper back  . BCC (basal cell carcinoma of skin) 07/29/1993   left sholder medial  . BCC (basal cell carcinoma of skin) 07/29/1993   left sholder lateral  . BCC (basal cell carcinoma of skin) 07/29/1993   right thigh  . BCC (basal cell carcinoma of skin) 07/29/1993   right sholder  . BCC (basal cell carcinoma of skin) 12/22/1994   right mid forearm  . BCC (basal cell carcinoma of skin) 12/22/1994   right upper forearm  . BCC (basal cell carcinoma of skin) 12/22/1994   lower right upper arm  . BCC (basal cell carcinoma of skin) 12/22/1994   right upper arm sholder  . BCC (basal cell carcinoma of skin) 08/11/1995   left leg below knee  . BCC (basal cell carcinoma of skin) 04/11/2002   mid back  . BCC (basal cell carcinoma of skin) 12/10/2002   right center upper back  . BCC (basal cell carcinoma of skin) 05/26/2005   right post sholder  . BCC (basal cell carcinoma) 05/09/2014   left inner shin  . BCC (basal cell carcinoma) 06/12/2014    left forearm  . Bowen's disease 10/07/1994   right post knee, right inner forearm/wrist  . Bowen's disease 08/11/1995   left sholder  . Cataract    left  . Chronic pain   . Depression   . Diverticulosis of colon 1998   mild  . DJD (degenerative joint disease)   . Duodenal ulcer    h/o  . GERD (gastroesophageal reflux disease)   . History of SCC (squamous cell carcinoma) of skin    Dr. Denna Haggard  . History of sinus bradycardia   . HLD (hyperlipidemia)    hypertriglyceridemia  . Hypertensive retinopathy of both eyes 11/18/2017  . IBS (irritable bowel syndrome)   . Internal hemorrhoids  1998  . Mitral regurgitation    mild  . Osteoarthritis    feet,shoulder,neck,back,hips and hands.  . Pneumonia   . PONV (postoperative nausea and vomiting)   . Rotator cuff tear, right 02/2019   infraspinatus and supraspinatus, and dislocation of long head of bicep tendon (Dr. Alvan Dame)  . SCC (squamous cell carcinoma) 11/26/2014   left hand, right hand, right deltoid  . SCC (squamous cell carcinoma) 05/03/2017   left cheek  . Sleep apnea    uses a mouth guard nightly  . Squamous cell carcinoma in situ (SCCIS) 07/05/2018   left hand  . Vitamin D deficiency    mild    Past Surgical History:  Procedure Laterality Date  . ABDOMINAL HYSTERECTOMY    . BLEPHAROPLASTY Bilateral 01/2018  . BROW LIFT Bilateral 10/10/2018   Procedure: BROW LIFT;  Surgeon: Wallace Going, DO;  Location: [facility];  Service: Plastics;  Laterality: Bilateral;  . BUNIONECTOMY     R 12/08, L 2004 (Dr. Janus Molder)  . CARPAL TUNNEL RELEASE  1989   bilateral  . CATARACT EXTRACTION Bilateral Left in 08/2012, Right 04/2016   Dr.Hecker  . CESAREAN SECTION     x2  . CHOLECYSTECTOMY  1992  . CLOSED REDUCTION NASAL FRACTURE N/A 08/29/2018   Procedure: CLOSED REDUCTION NASAL FRACTURE;  Surgeon: Wallace Going, DO;  Location: [facility];  Service: Plastics;  Laterality: N/A;  1 hour, please   . COLONOSCOPY  2006  . COLONOSCOPY  01/2012   due again 01/2022; mild diverticulosis  . epidural steroid injection, back  02/2010  . HIP SURGERY     right bursectomy x 3  . HIP SURGERY Right 2000   torn cartilage, repaired  . INGUINAL HERNIA REPAIR  9/09   bilat  . NECK SURGERY  1989   c6-7 cervical laminectomy and diskecotmy  . NECK SURGERY  1989   per patient posterior area of neck  . REVERSE SHOULDER ARTHROPLASTY Right 04/11/2019   Procedure: REVERSE SHOULDER ARTHROPLASTY;  Surgeon: Nicholes Stairs, MD;  Location: [employer] [facility];  Service: Orthopedics;  Laterality: Right;  [facility]  . SHOULDER SURGERY  2/05   left rotator cuff repair  . SHOULDER SURGERY Left 03/14/2018   rotator cuff repair; Dr. Tonita Cong  . TONSILLECTOMY  age 57  . TOTAL ABDOMINAL HYSTERECTOMY W/ BILATERAL SALPINGOOPHORECTOMY  1997   fibroids  . TROCHANTERIC BURSA EXCISION Right 1978  . TUBAL LIGATION  1986  . WISDOM TOOTH EXTRACTION      Review of systems negative except as noted in HPI / PMHx or noted below:  Review of Systems  Constitutional: Negative.   HENT: Negative.   Eyes: Negative.   Respiratory: Negative.   Cardiovascular: Negative.   Gastrointestinal: Negative.   Genitourinary: Negative.   Musculoskeletal: Negative.   Skin: Negative.   Neurological: Negative.   Endo/Heme/Allergies: Negative.   Psychiatric/Behavioral: Negative.      Objective:   Vitals:   04/16/20 1340  BP: 120/86  Pulse: 63  Resp: 18  Temp: (!) 97.3 F (36.3 C)  SpO2: 98%   Height: 5\' 4"  (162.6 cm)      Physical Exam Constitutional:      Appearance: She is not diaphoretic.  HENT:     Head: Normocephalic.     Right Ear: Tympanic membrane, ear canal and external ear normal.     Left Ear: Tympanic membrane, ear canal and external ear normal.     Nose: Nose normal. No  mucosal edema or rhinorrhea.     Mouth/Throat:     Mouth: Oropharynx is clear and moist and mucous membranes are normal.     Pharynx:  Uvula midline. No oropharyngeal exudate.  Eyes:     Conjunctiva/sclera: Conjunctivae normal.  Neck:     Thyroid: No thyromegaly.     Trachea: Trachea normal. No tracheal tenderness or tracheal deviation.  Cardiovascular:     Rate and Rhythm: Normal rate and regular rhythm.     Heart sounds: Normal heart sounds, S1 normal and S2 normal. No murmur heard.   Pulmonary:     Effort: No respiratory distress.     Breath sounds: Normal breath sounds. No stridor. No wheezing or rales.  Musculoskeletal:        General: No edema.  Lymphadenopathy:     Head:     Right side of head: No tonsillar adenopathy.     Left side of head: No tonsillar adenopathy.     Cervical: No cervical adenopathy.  Skin:    Findings: No erythema or rash.     Nails: There is no clubbing.  Neurological:     Mental Status: She is alert.     Diagnostics:    Spirometry was performed and demonstrated an FEV1 of 2.31 at 104 % of predicted.  Results of blood tests obtained 23 January 2020 identified IgG 908 mg/DL, IgM 29 mg/DL, IgA 74 mg/DL  Assessment and Plan:   1. CVID (common variable immunodeficiency) ([place])   2. Hypogammaglobulinemia ([place])   3. Bronchiectasis without complication ([place])   4. Nocardia infection   5. LPRD (laryngopharyngeal reflux disease)   6. Diarrhea, unspecified type     1.  Continue immunoglobulin infusions  2.  Continue to treat reflex / LPR:   A. Minimize caffeine consumption  B. Nexium 40 mg in AM  C. Famotidine 40 mg in PM  3.  Treat and prevent inflammation of upper airway:   A.  Montelukast 10 mg -1 tablet 1 time per day  B.  OTC Rhinocort/Nasacort -1 spray each nostril 1 time per day  4. Try colestipol 1 g - 1/2-1 tablet 1 time per day.  Does this help with diarrhea?  5.  Undergo evaluation with GI for diarrhea and reflux.  May need oral budesonide if lymphocytic infiltration of intestine is identified.  6.  Return to clinic in 12 weeks or earlier if problem  Fawna  is doing well at this point in time on her immunoglobulin infusions directed against her hypogammaglobulinemic condition and she has not had any significant infections.  She is finishing up therapy for her nocardia infection of her lung.  Her reflux appears to be okay although she still has regurgitation even in the setting of utilizing therapy noted above.  Her upper airway appears to be doing relatively well while using therapy directed against respiratory tract inflammation.  Diarrhea is a problem and given the fact that she is status post cholecystectomy I asked her to try some colestipol to see if this helps with her diarrhea.  If she requires further evaluation by GI for diarrhea and she has evidence of lymphoid infiltration of her gut then she will probably require oral budesonide as this form of lymphocytic infiltration is commonly seen in patients who have hypogammaglobulinemia / CVID.  Allena Katz, MD Allergy / Immunology [place]

## 2020-04-16 NOTE — Patient Instructions (Addendum)
  1.  Continue immunoglobulin infusions  2.  Continue to treat reflex / LPR:   A. Minimize caffeine consumption  B. Nexium 40 mg in AM  C. Famotidine 40 mg in PM  3.  Treat and prevent inflammation of upper airway:   A.  Montelukast 10 mg -1 tablet 1 time per day  B.  OTC Rhinocort/Nasacort -1 spray each nostril 1 time per day  4. Try colestipol 1 g - 1/2-1 tablet 1 time per day.  Does this help with diarrhea?  5.  Undergo evaluation with GI for diarrhea and reflux.  May need oral budesonide if lymphocytic infiltration of intestine is identified.  6.  Return to clinic in 12 weeks or earlier if problem

## 2020-04-17 ENCOUNTER — Ambulatory Visit: Payer: Medicare PPO | Admitting: Nurse Practitioner

## 2020-04-17 ENCOUNTER — Encounter: Payer: Self-pay | Admitting: Allergy and Immunology

## 2020-04-17 ENCOUNTER — Encounter: Payer: Self-pay | Admitting: Nurse Practitioner

## 2020-04-17 VITALS — BP 110/70 | HR 61 | Ht 64.0 in | Wt 149.0 lb

## 2020-04-17 DIAGNOSIS — R197 Diarrhea, unspecified: Secondary | ICD-10-CM

## 2020-04-17 DIAGNOSIS — R634 Abnormal weight loss: Secondary | ICD-10-CM

## 2020-04-17 NOTE — Patient Instructions (Signed)
If you are age 73 or older, your body mass index should be between 23-30. Your Body mass index is 25.58 kg/m. If this is out of the aforementioned range listed, please consider follow up with your Primary Care Provider.  If you are age 39 or younger, your body mass index should be between 19-25. Your Body mass index is 25.58 kg/m. If this is out of the aformentioned range listed, please consider follow up with your Primary Care Provider.   PROCEDURES:  You have been scheduled for a colonoscopy. Please follow the written instructions given to you at your visit today. Please pick up your prep supplies at the pharmacy within the next 1-3 days. If you use inhalers (even only as needed), please bring them with you on the day of your procedure.  It was great seeing you today! Thank you for entrusting me with your care and choosing [facility].  Tye Savoy, NP

## 2020-04-17 NOTE — Progress Notes (Signed)
ASSESSMENT AND PLAN    #75 year old female with multiple episodes of watery diarrhea every day since January.  Stool for C. difficile negative on 2 occasions.  O&P negative.  Infection seems unlikely but not excluded given her immunoglobulin deficiency.  Rule out microscopic colitis.  IBD seems unlikely --For further evaluation patient will be scheduled for colonoscopy with possible random colon biopsies. Patient will be scheduled for a colonoscopy. The risks and benefits of colonoscopy with possible polypectomy / biopsies were discussed and the patient agrees to proceed.   # Hypogammaglobinemia, followed by Immunology.  Gets IVIG infusions  # Bronchiectasis, followed by Pulmonary  #History of nocardia pulmonary infection. Bactrim discontinued a month or so ago. Followed by ID  # History of GERD. On chronic Nexium. Evening dose of Pepcid recently added to regimen for chronic cough / presumed LPR  HISTORY OF PRESENT ILLNESS     Primary Gastroenterologist : previously Dr Deatra Ina Chief Complaint : Diarrhea  Beverly [name] is a 75 y.o. 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   Patient referred by infectious disease for evaluation of diarrhea . In April 2021 a shoulder CT scan incidentally demonstrated ground glass opacities of lungs. Patient had a chronic productive cough but has asthma and GERD.  She was referred to Pulmonologist and diagnosed with bronchiectasis.  During this evaluation she was also found to have a hypogammaglobulinemia.   She was evaluated by Immunology and a dose of evening famotidine was added to her a.m. Nexium to treat what was felt to be LPR.  Sputum culture returned positive for nocardia infection. She was referred to ID who in September 2021 started her on Bactrim DS 2 tablets 3 times daily.   Around the same time she was also started on IVIG.    Patient says she was unable to tolerate the high-dose of Bactrim secondary to vomiting.The dose was reduced and symptoms resolved.   In January she suddenly developed watery diarrhea. She was having up to 10 BMs a day.  C. difficile by PCR negative x2.  O&P negative . Sometimes she sees fresh blood but attributes it to hemorrhoids. She has not had an medication changes other than the addition of IVIG and bactrim in September. Imodium controls the diarrhea but she saw ID yesterday and was prescribed Colestid. She had a cholecystectomy in 1992. She has never had problems with diarrhea until January. In fact she was tended more towards constipation. Bactrim stopped a month ago without any improvement in diarrhea.   Patient gives a history of GERD with regurgitation, she takes a daily PPI and as mentioned above, and evening dose of Pepcid was recently added to her regimen for cough and possible LPR   Previous Endoscopic Evaluations / Pertinent Studies:   Dec 2013 screening colonoscopy --Mild diverticulosis  Past Medical History:  Diagnosis Date  . Allergy   . Amaurosis fugax    negative w/u through [facility]  . Asthma   . Back pain    Dr Joline Salt 02/2010-epidural injection x 2 at L4-5 with good effect  . 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 BCC (basal cell carcinoma of skin) 05/09/2014   mid lower back  . BCC (basal cell carcinoma of skin) 05/03/2017   right low back  . BCC (basal cell carcinoma of skin) 05/03/2017   left upper back  .  BCC (basal cell carcinoma of skin) 07/05/2018   left mid back  . BCC (basal cell carcinoma of skin) 05/20/1992   upper back  . BCC (basal cell carcinoma of skin) 07/29/1993   left sholder medial  . BCC (basal cell carcinoma of skin) 07/29/1993   left sholder lateral  . BCC (basal cell carcinoma of skin) 07/29/1993   right thigh  . BCC (basal cell carcinoma of skin) 07/29/1993    right sholder  . BCC (basal cell carcinoma of skin) 12/22/1994   right mid forearm  . BCC (basal cell carcinoma of skin) 12/22/1994   right upper forearm  . BCC (basal cell carcinoma of skin) 12/22/1994   lower right upper arm  . BCC (basal cell carcinoma of skin) 12/22/1994   right upper arm sholder  . BCC (basal cell carcinoma of skin) 08/11/1995   left leg below knee  . BCC (basal cell carcinoma of skin) 04/11/2002   mid back  . BCC (basal cell carcinoma of skin) 12/10/2002   right center upper back  . BCC (basal cell carcinoma of skin) 05/26/2005   right post sholder  . BCC (basal cell carcinoma) 05/09/2014   left inner shin  . BCC (basal cell carcinoma) 06/12/2014   left forearm  . Bowen's disease 10/07/1994   right post knee, right inner forearm/wrist  . Bowen's disease 08/11/1995   left sholder  . Cataract    left  . Chronic pain   . Depression   . Diverticulosis of colon 1998   mild  . DJD (degenerative joint disease)   . Duodenal ulcer    h/o  . GERD (gastroesophageal reflux disease)   . History of SCC (squamous cell carcinoma) of skin    Dr. Denna Haggard  . History of sinus bradycardia   . HLD (hyperlipidemia)    hypertriglyceridemia  . Hypertensive retinopathy of both eyes 11/18/2017  . IBS (irritable bowel syndrome)   . Internal hemorrhoids 1998  . Mitral regurgitation    mild  . Osteoarthritis    feet,shoulder,neck,back,hips and hands.  . Pneumonia   . PONV (postoperative nausea and vomiting)   . Rotator cuff tear, right 02/2019   infraspinatus and supraspinatus, and dislocation of long head of bicep tendon (Dr. Alvan Dame)  . SCC (squamous cell carcinoma) 11/26/2014   left hand, right hand, right deltoid  . SCC (squamous cell carcinoma) 05/03/2017   left cheek  . Sleep apnea    uses a mouth guard nightly  . Squamous cell carcinoma in situ (SCCIS) 07/05/2018   left hand  . Vitamin D deficiency    mild     Past Surgical History:  Procedure Laterality  Date  . ABDOMINAL HYSTERECTOMY    . BLEPHAROPLASTY Bilateral 01/2018  . BROW LIFT Bilateral 10/10/2018   Procedure: BROW LIFT;  Surgeon: Wallace Going, DO;  Location: [facility];  Service: Plastics;  Laterality: Bilateral;  . BUNIONECTOMY     R 12/08, L 2004 (Dr. Janus Molder)  . CARPAL TUNNEL RELEASE  1989   bilateral  . CATARACT EXTRACTION Bilateral Left in 08/2012, Right 04/2016   Dr.Hecker  . CESAREAN SECTION     x2  . CHOLECYSTECTOMY  1992  . CLOSED REDUCTION NASAL FRACTURE N/A 08/29/2018   Procedure: CLOSED REDUCTION NASAL FRACTURE;  Surgeon: Wallace Going, DO;  Location: [facility];  Service: Plastics;  Laterality: N/A;  1 hour, please  . COLONOSCOPY  2006  . COLONOSCOPY  01/2012   due  again 01/2022; mild diverticulosis  . epidural steroid injection, back  02/2010  . HIP SURGERY     right bursectomy x 3  . HIP SURGERY Right 2000   torn cartilage, repaired  . INGUINAL HERNIA REPAIR  9/09   bilat  . NECK SURGERY  1989   c6-7 cervical laminectomy and diskecotmy  . NECK SURGERY  1989   per patient posterior area of neck  . REVERSE SHOULDER ARTHROPLASTY Right 04/11/2019   Procedure: REVERSE SHOULDER ARTHROPLASTY;  Surgeon: Nicholes Stairs, MD;  Location: [facility];  Service: Orthopedics;  Laterality: Right;  [facility]  . SHOULDER SURGERY  2/05   left rotator cuff repair  . SHOULDER SURGERY Left 03/14/2018   rotator cuff repair; Dr. Tonita Cong  . TONSILLECTOMY  age 29  . TOTAL ABDOMINAL HYSTERECTOMY W/ BILATERAL SALPINGOOPHORECTOMY  1997   fibroids  . TROCHANTERIC BURSA EXCISION Right 1978  . TUBAL LIGATION  1986  . WISDOM TOOTH EXTRACTION     Family History  Problem Relation Age of Onset  . Depression Mother   . Schizophrenia Mother   . Cancer Father        oral  . Heart disease Father        bradycardia  . Esophageal cancer Father   . Cancer Sister        skin and lung  . COPD Sister   . Hypertension Sister   .  Osteoporosis Sister        compression fx's x 3 11/2018  . Hyperthyroidism Brother   . Cancer Brother        metastatic cancer to bone (?primary)  . Hyperlipidemia Daughter   . Asthma Son   . Cancer Paternal Grandmother 40       colon cancer, metastatic to liver  . Colon cancer Paternal Grandmother   . Liver cancer Paternal Grandmother   . Diabetes Maternal Grandmother   . ADD / ADHD Other    Social History   Tobacco Use  . Smoking status: Never Smoker  . Smokeless tobacco: Never Used  Vaping Use  . Vaping Use: Never used  Substance Use Topics  . Alcohol use: Yes    Alcohol/week: 7.0 standard drinks    Types: 7 Glasses of wine per week    Comment: 1 glass of wine daily  . Drug use: No   Current Outpatient Medications  Medication Sig Dispense Refill  . acetaminophen (TYLENOL) 500 MG tablet Take 1,000 mg by mouth every 6 (six) hours as needed for moderate pain.     Marland Kitchen albuterol (PROVENTIL HFA;VENTOLIN HFA) 108 (90 Base) MCG/ACT inhaler Inhale 2 puffs into the lungs every 6 (six) hours as needed for wheezing or shortness of breath. 18 g 1  . aspirin EC 81 MG tablet Take 81 mg by mouth daily.    Marland Kitchen atorvastatin (LIPITOR) 40 MG tablet TAKE 1 TABLET BY MOUTH DAILY. 90 tablet 1  . Calcium Carbonate-Vitamin D 600-200 MG-UNIT TABS Take 1 tablet by mouth 2 (two) times daily.    . celecoxib (CELEBREX) 200 MG capsule TAKE 1 CAPSULE BY MOUTH TWICE DAILY 180 capsule 1  . colestipol (COLESTID) 1 g tablet 1/2-1 tablet 1 time per day 30 tablet 3  . diclofenac Sodium (VOLTAREN) 1 % GEL Apply 2 g topically 4 (four) times daily as needed (joint pain).     Marland Kitchen esomeprazole (NEXIUM) 40 MG capsule TAKE 1 CAPSULE BY MOUTH DAILY IN THE MORNING 90 capsule 1  . famotidine (PEPCID) 40  MG tablet Take 1 tablet (40 mg total) by mouth at bedtime. 90 tablet 1  . Fluorouracil 4 % CREA Apply 1 application topically every 3 (three) days. Dr Denna Haggard    . gabapentin (NEURONTIN) 800 MG tablet Take 800 mg by mouth at  bedtime.    . GUAIFENESIN ER PO Take 400 mg by mouth 2 (two) times daily.    Marland Kitchen HIZENTRA 10 GM/50ML SOLN     . HYDROcodone-acetaminophen (NORCO) 7.5-325 MG tablet Take 1 tablet by mouth every 12 (twelve) hours as needed for moderate pain. Dr Hardin Negus    . loperamide (IMODIUM A-D) 2 MG tablet Take 2 mg by mouth 4 (four) times daily as needed for diarrhea or loose stools.    . Melatonin-Pyridoxine (MELATIN PO)     . methocarbamol (ROBAXIN) 500 MG tablet Take 500 mg by mouth in the morning and at bedtime.     . montelukast (SINGULAIR) 10 MG tablet TAKE 1 TABLET BY MOUTH AT BEDTIME. 90 tablet 1  . Multiple Vitamins-Minerals (MULTIVITAMIN WITH MINERALS) tablet Take 1 tablet by mouth daily.    Marland Kitchen omega-3 acid ethyl esters (LOVAZA) 1 g capsule TAKE 2 CAPSULES BY MOUTH TWO TIMES DAILY 180 capsule 1  . ondansetron (ZOFRAN) 4 MG tablet Take 1 tablet (4 mg total) by mouth every 8 (eight) hours as needed for nausea or vomiting. 60 tablet 2  . PARoxetine (PAXIL) 40 MG tablet Take 60 mg by mouth daily.    Marland Kitchen Respiratory Therapy Supplies (FLUTTER) DEVI 1 puff by Does not apply route 2 (two) times daily. 1 each 0  . sodium chloride HYPERTONIC 3 % nebulizer solution Take by nebulization in the morning and at bedtime. 750 mL 12  . triamcinolone (NASACORT) 55 MCG/ACT AERO nasal inhaler Place 2 sprays into the nose daily.     No current facility-administered medications for this visit.   Allergies  Allergen Reactions  . Bactrim [Sulfamethoxazole-Trimethoprim] Diarrhea and Nausea And Vomiting    Severe nausea, vomiting, and diarrhea  . Adhesive [Tape] Rash  . Codeine Rash     Review of Systems: Positive for chronic productive cough. All other systems reviewed and negative except where noted in HPI.   PHYSICAL EXAM :    Wt Readings from Last 3 Encounters:  04/17/20 149 lb (67.6 kg)  03/12/20 147 lb (66.7 kg)  02/20/20 155 lb 6.4 oz (70.5 kg)    BP 110/70   Pulse 61   Ht 5\' 4"  (1.626 m)   Wt 149 lb  (67.6 kg)   SpO2 98%   BMI 25.58 kg/m  Constitutional:  Pleasant female in no acute distress. Psychiatric: Normal mood and affect. Behavior is normal. EENT: Pupils normal.  Conjunctivae are normal. No scleral icterus. Neck supple.  Cardiovascular: Normal rate, regular rhythm. No edema Pulmonary/chest: Effort normal and breath sounds normal. No wheezing, rales or rhonchi. Abdominal: Soft, nondistended, mild generalized lower abdominal tenderness . Bowel sounds active throughout. There are no masses palpable. No hepatomegaly. Neurological: Alert and oriented to person place and time. Skin: Skin is warm and dry. No rashes noted.  Tye Savoy, NP  04/17/2020, 2:27 PM   Cc:  Rosiland Oz, MD

## 2020-04-18 ENCOUNTER — Telehealth: Payer: Self-pay | Admitting: *Deleted

## 2020-04-18 MED ORDER — COLESTIPOL HCL 1 G PO TABS: ORAL_TABLET | ORAL | 3 refills | Status: DC

## 2020-04-18 NOTE — Telephone Encounter (Signed)
Called patient and advised Dr Neldon Mc instructions and will send Rx to [facility]. Advised her if still unable to get same let me know

## 2020-04-18 NOTE — Telephone Encounter (Signed)
Patient called and advised that she is still having the ongoing diarrhea issue. She has not tried the colestipol due to [facility] not able tot get same until middle of month. She took her Hizentra yesterday and woke in the middle of night with issue. Patient wondering if possible to change to another product?

## 2020-04-18 NOTE — Telephone Encounter (Signed)
We need to get her colestipol to try.  She may need to go to a different pharmacy.

## 2020-04-19 ENCOUNTER — Encounter: Payer: Self-pay | Admitting: Nurse Practitioner

## 2020-04-19 DIAGNOSIS — M65312 Trigger thumb, left thumb: Secondary | ICD-10-CM | POA: Diagnosis not present

## 2020-04-19 DIAGNOSIS — G5602 Carpal tunnel syndrome, left upper limb: Secondary | ICD-10-CM | POA: Diagnosis not present

## 2020-04-20 ENCOUNTER — Encounter: Payer: Self-pay | Admitting: Allergy and Immunology

## 2020-04-24 ENCOUNTER — Other Ambulatory Visit (HOSPITAL_COMMUNITY): Payer: Self-pay | Admitting: Anesthesiology

## 2020-04-24 ENCOUNTER — Other Ambulatory Visit: Payer: Self-pay | Admitting: Internal Medicine

## 2020-04-24 DIAGNOSIS — M150 Primary generalized (osteo)arthritis: Secondary | ICD-10-CM | POA: Diagnosis not present

## 2020-04-24 DIAGNOSIS — Z79891 Long term (current) use of opiate analgesic: Secondary | ICD-10-CM | POA: Diagnosis not present

## 2020-04-24 DIAGNOSIS — M47816 Spondylosis without myelopathy or radiculopathy, lumbar region: Secondary | ICD-10-CM | POA: Diagnosis not present

## 2020-04-24 DIAGNOSIS — G894 Chronic pain syndrome: Secondary | ICD-10-CM | POA: Diagnosis not present

## 2020-04-24 MED FILL — PARoxetine HCL 30 MG TABS: 30 | 90 days supply | Qty: 180 | Fill #0

## 2020-04-24 MED FILL — OMEGA-3-ACID ETHYL ESTERS 1: 1 | 45 days supply | Qty: 180 | Fill #0

## 2020-04-24 MED FILL — HYDROCODON-APAP 7.5-325: 7.5-325 | 25 days supply | Qty: 100 | Fill #0

## 2020-04-25 ENCOUNTER — Other Ambulatory Visit: Payer: Self-pay

## 2020-04-25 DIAGNOSIS — J849 Interstitial pulmonary disease, unspecified: Secondary | ICD-10-CM

## 2020-04-25 MED ORDER — SULFAMETHOXAZOLE-TRIMETHOPRIM 800-160 MG PO TABS: 1.0000 | ORAL_TABLET | Freq: Two times a day (BID) | ORAL | 2 refills | Status: DC

## 2020-04-25 MED FILL — BELBUCA 600 MCG FILM: 600 | 30 days supply | Qty: 60 | Fill #1

## 2020-04-25 MED FILL — SULFAMETHOXAZOLE-TMP DS TAB: 800-160 | 30 days supply | Qty: 60 | Fill #0

## 2020-04-26 ENCOUNTER — Encounter: Payer: Self-pay | Admitting: Allergy and Immunology

## 2020-04-29 ENCOUNTER — Ambulatory Visit: Payer: Medicare PPO | Admitting: Internal Medicine

## 2020-04-29 ENCOUNTER — Other Ambulatory Visit: Payer: Self-pay

## 2020-04-29 ENCOUNTER — Encounter: Payer: Self-pay | Admitting: Internal Medicine

## 2020-04-29 VITALS — BP 118/70 | HR 65 | Temp 97.2°F | Ht 64.0 in | Wt 148.2 lb

## 2020-04-29 DIAGNOSIS — J310 Chronic rhinitis: Secondary | ICD-10-CM

## 2020-04-29 DIAGNOSIS — D801 Nonfamilial hypogammaglobulinemia: Secondary | ICD-10-CM

## 2020-04-29 DIAGNOSIS — G4733 Obstructive sleep apnea (adult) (pediatric): Secondary | ICD-10-CM

## 2020-04-29 DIAGNOSIS — J479 Bronchiectasis, uncomplicated: Secondary | ICD-10-CM

## 2020-04-29 NOTE — Patient Instructions (Addendum)
The patient should have follow up scheduled with myself in 4 months.   Prior to next visit patient should have: Spirometry/FENO

## 2020-04-29 NOTE — Progress Notes (Signed)
Beverly [name]    [id number]    [birth year]  Primary Care Physician:Hernandez Everardo Beals, MD Date of Appointment: 04/29/2020 Established Patient Visit  Chief complaint:   Chief Complaint  Patient presents with  . Follow-up    3 mo f/u for bronchiectasis. Had to stop the Bactrim for a short period of time per ID but was able to restart medication on last Friday.      HPI: Beverly [name] is a 75 y.o. woman with bronchiectasis a hypogammaglobulinemia.  Interval Updates: Here for follow up today. Having diarrhea which was felt to be attributed to bactrim - persisted despite holding this. She has resumed bactrim. She has GI and is due for colonoscopy next month. Her IVIG infusions have continued as weekly, every Wednesday. She wonders if stools are related to IVIG infusion because the most diarrhea is on Thursdays. She is doing a trial of cholestipol because she had a cholecystectomy in 1992. She has been on this about 10 days.   Most recent sputum culture reviewed Mar 01 2020 - negative for acid fast bacilli.  Cough improved  No fevers, chills, night sweats, unintentional weight loss.   Still taking nasacort. Already on singulair and allegra.   Has stopped airway clearance. No wheezing or coughing.   I have reviewed the patient's family social and past medical history and updated as appropriate.   Past Medical History:  Diagnosis Date  . Allergy   . Amaurosis fugax    negative w/u through [facility]  . Asthma   . Back pain    Dr Joline Salt 02/2010-epidural injection x 2 at L4-5 with good effect  . 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 BCC (basal cell carcinoma of skin) 05/09/2014   mid lower back  . BCC (basal cell carcinoma of skin) 05/03/2017   right low back  . BCC (basal cell carcinoma of skin) 05/03/2017   left upper back  . BCC (basal cell carcinoma of skin) 07/05/2018   left mid back  . BCC (basal cell carcinoma of skin) 05/20/1992    upper back  . BCC (basal cell carcinoma of skin) 07/29/1993   left sholder medial  . BCC (basal cell carcinoma of skin) 07/29/1993   left sholder lateral  . BCC (basal cell carcinoma of skin) 07/29/1993   right thigh  . BCC (basal cell carcinoma of skin) 07/29/1993   right sholder  . BCC (basal cell carcinoma of skin) 12/22/1994   right mid forearm  . BCC (basal cell carcinoma of skin) 12/22/1994   right upper forearm  . BCC (basal cell carcinoma of skin) 12/22/1994   lower right upper arm  . BCC (basal cell carcinoma of skin) 12/22/1994   right upper arm sholder  . BCC (basal cell carcinoma of skin) 08/11/1995   left leg below knee  . BCC (basal cell carcinoma of skin) 04/11/2002   mid back  . BCC (basal cell carcinoma of skin) 12/10/2002   right center upper back  . BCC (basal cell carcinoma of skin) 05/26/2005   right post sholder  . BCC (basal cell carcinoma) 05/09/2014   left inner shin  . BCC (basal cell carcinoma) 06/12/2014   left forearm  . Bowen's disease 10/07/1994   right post knee, right inner forearm/wrist  . Bowen's disease 08/11/1995   left sholder  . Cataract    left  . Chronic pain   . Depression   .  Diverticulosis of colon 1998   mild  . DJD (degenerative joint disease)   . Duodenal ulcer    h/o  . GERD (gastroesophageal reflux disease)   . History of SCC (squamous cell carcinoma) of skin    Dr. Denna Haggard  . History of sinus bradycardia   . HLD (hyperlipidemia)    hypertriglyceridemia  . Hypertensive retinopathy of both eyes 11/18/2017  . IBS (irritable bowel syndrome)   . Internal hemorrhoids 1998  . Mitral regurgitation    mild  . Osteoarthritis    feet,shoulder,neck,back,hips and hands.  . Pneumonia   . PONV (postoperative nausea and vomiting)   . Rotator cuff tear, right 02/2019   infraspinatus and supraspinatus, and dislocation of long head of bicep tendon (Dr. Alvan Dame)  . SCC (squamous cell carcinoma) 11/26/2014   left hand, right  hand, right deltoid  . SCC (squamous cell carcinoma) 05/03/2017   left cheek  . Sleep apnea    uses a mouth guard nightly  . Squamous cell carcinoma in situ (SCCIS) 07/05/2018   left hand  . Vitamin D deficiency    mild    Past Surgical History:  Procedure Laterality Date  . ABDOMINAL HYSTERECTOMY    . BLEPHAROPLASTY Bilateral 01/2018  . BROW LIFT Bilateral 10/10/2018   Procedure: BROW LIFT;  Surgeon: Wallace Going, DO;  Location: [place];  Service: Plastics;  Laterality: Bilateral;  . BUNIONECTOMY     R 12/08, L 2004 (Dr. Janus Molder)  . CARPAL TUNNEL RELEASE  1989   bilateral  . CATARACT EXTRACTION Bilateral Left in 08/2012, Right 04/2016   Dr.Hecker  . CESAREAN SECTION     x2  . CHOLECYSTECTOMY  1992  . CLOSED REDUCTION NASAL FRACTURE N/A 08/29/2018   Procedure: CLOSED REDUCTION NASAL FRACTURE;  Surgeon: Wallace Going, DO;  Location: [place];  Service: Plastics;  Laterality: N/A;  1 hour, please  . COLONOSCOPY  2006  . COLONOSCOPY  01/2012   due again 01/2022; mild diverticulosis  . epidural steroid injection, back  02/2010  . HIP SURGERY     right bursectomy x 3  . HIP SURGERY Right 2000   torn cartilage, repaired  . INGUINAL HERNIA REPAIR  9/09   bilat  . NECK SURGERY  1989   c6-7 cervical laminectomy and diskecotmy  . NECK SURGERY  1989   per patient posterior area of neck  . REVERSE SHOULDER ARTHROPLASTY Right 04/11/2019   Procedure: REVERSE SHOULDER ARTHROPLASTY;  Surgeon: Nicholes Stairs, MD;  Location: [place];  Service: Orthopedics;  Laterality: Right;  [facility]  . SHOULDER SURGERY  2/05   left rotator cuff repair  . SHOULDER SURGERY Left 03/14/2018   rotator cuff repair; Dr. Tonita Cong  . TONSILLECTOMY  age 65  . TOTAL ABDOMINAL HYSTERECTOMY W/ BILATERAL SALPINGOOPHORECTOMY  1997   fibroids  . TROCHANTERIC BURSA EXCISION Right 1978  . TUBAL LIGATION  1986  . WISDOM TOOTH EXTRACTION      Family History   Problem Relation Age of Onset  . Depression Mother   . Schizophrenia Mother   . Cancer Father        oral  . Heart disease Father        bradycardia  . Esophageal cancer Father   . Cancer Sister        skin and lung  . COPD Sister   . Hypertension Sister   . Osteoporosis Sister        compression fx's x  3 11/2018  . Hyperthyroidism Brother   . Cancer Brother        metastatic cancer to bone (?primary)  . Hyperlipidemia Daughter   . Asthma Son   . Cancer Paternal Grandmother 65       colon cancer, metastatic to liver  . Colon cancer Paternal Grandmother   . Liver cancer Paternal Grandmother   . Diabetes Maternal Grandmother   . ADD / ADHD Other     Social History   Occupational History  . Occupation: STAFF Optician, dispensing: [employer]  Tobacco Use  . Smoking status: Never Smoker  . Smokeless tobacco: Never Used  Vaping Use  . Vaping Use: Never used  Substance and Sexual Activity  . Alcohol use: Yes    Alcohol/week: 7.0 standard drinks    Types: 7 Glasses of wine per week    Comment: 1 glass of wine daily  . Drug use: No  . Sexual activity: Not Currently     Physical Exam: Blood pressure 118/70, pulse 65, temperature (!) 97.2 F (36.2 C), temperature source Temporal, height 5\' 4"  (1.626 m), weight 148 lb 3.2 oz (67.2 kg), SpO2 98 %.  Gen:      No acute distress Lungs:    Clear to auscultation bilaterally, no wheezes or crackles CV:         Regular rate and rhythm; no murmurs, rubs, or gallops.  No pedal edema  Data Reviewed: Imaging: I have personally reviewed the CT Chest May 2021 which shows central and lower lobe bronchiectasis. TBM on expiratory cuts.   PFTs: Spirometry July 2021 Normal spirometry, no airflow limitation. Unchanged from June 2021 and April 2019.   Sleep study June 2021 IMPRESSIONS - No significant obstructive sleep apnea occurred during this study (AHI = 1.9/h). - No significant central sleep apnea occurred during  this study (CAI = 0.7/h). - The patient had minimal or no oxygen desaturation during the study (Min O2 = 89.0%) - The patient snored with soft snoring volume.  Labs: Lab Results  Component Value Date   WBC 4.3 03/28/2020   HGB 11.8 03/28/2020   HCT 35.9 03/28/2020   MCV 98.4 03/28/2020   PLT 123 (L) 03/28/2020   Lab Results  Component Value Date   NA 141 03/12/2020   K 3.3 (L) 03/12/2020   CL 104 03/12/2020   CO2 28 03/12/2020     Immunization status: Immunization History  Administered Date(s) Administered  . Fluad Quad(high Dose 65+) 11/10/2019  . Hepatitis A 06/17/2006, 01/05/2007  . IPV 04/15/2012  . Influenza Split 10/31/2013  . Influenza, High Dose Seasonal PF 11/16/2016, 11/16/2017, 11/01/2018  . Influenza,inj,quad, With Preservative 11/16/2017, 12/12/2018  . Influenza-Unspecified 11/09/2014, 11/14/2015  . PFIZER Comirnaty(Gray Top)Covid-19 Tri-Sucrose Vaccine 03/22/2020  . PFIZER(Purple Top)SARS-COV-2 Vaccination 02/05/2019, 02/25/2019  . Pneumococcal Conjugate-13 07/20/2013  . Pneumococcal Polysaccharide-23 06/17/2006, 11/26/2014, 09/05/2019  . Tdap 09/16/2004, 10/08/2014  . Typhoid Live 06/17/2011, 06/10/2017  . Unspecified SARS-COV-2 Vaccination 02/20/2019  . Yellow Fever 07/08/2012  . Zoster 04/04/2009  . Zoster Recombinat (Shingrix) 04/28/2016, 08/15/2016    Assessment:  Bronchiectasis with Nocardiosis on 6 months of bactrim therapy with ID.  Possible Mild intermittent asthma OSA with excessive daytime sleepiness - managed with oral appliance by dentistry.  Chronic cough with LPR Tracheobronchomalacia (TBM) Chronic rhinitis Diarrhea  Plan/Recommendations:  Continue current therapy for LP Continue rhinitis treatment with singulair, nasacort, allegra. Can add nasal saline rinses prn Continue oral appliance for OSA.  Talk with  Dr. Neldon Mc about taking a break for a week from IVIG infusion would have any impact on her diarrhea.  Continue prn albuterol   May need to add back airway clearance if cough or mucus production worsens.   I spent 30 minutes in the care of this patient today including pre-charting, chart review, review of results, face-to-face care, coordination of care and communication with consultants etc.).   Return to Care: Return in about 4 months (around 08/29/2020).  Lenice Llamas, MD Pulmonary and [place]

## 2020-05-01 ENCOUNTER — Other Ambulatory Visit (HOSPITAL_COMMUNITY): Payer: Self-pay | Admitting: Anesthesiology

## 2020-05-01 ENCOUNTER — Other Ambulatory Visit: Payer: Self-pay | Admitting: Internal Medicine

## 2020-05-01 DIAGNOSIS — M159 Polyosteoarthritis, unspecified: Secondary | ICD-10-CM

## 2020-05-02 ENCOUNTER — Other Ambulatory Visit: Payer: Self-pay | Admitting: Infectious Diseases

## 2020-05-02 DIAGNOSIS — Z5181 Encounter for therapeutic drug level monitoring: Secondary | ICD-10-CM

## 2020-05-03 DIAGNOSIS — G4733 Obstructive sleep apnea (adult) (pediatric): Secondary | ICD-10-CM | POA: Diagnosis not present

## 2020-05-03 DIAGNOSIS — J310 Chronic rhinitis: Secondary | ICD-10-CM | POA: Diagnosis not present

## 2020-05-03 DIAGNOSIS — J479 Bronchiectasis, uncomplicated: Secondary | ICD-10-CM | POA: Diagnosis not present

## 2020-05-06 ENCOUNTER — Other Ambulatory Visit: Payer: Medicare PPO

## 2020-05-06 ENCOUNTER — Telehealth: Payer: Self-pay | Admitting: *Deleted

## 2020-05-06 ENCOUNTER — Other Ambulatory Visit: Payer: Self-pay

## 2020-05-06 ENCOUNTER — Other Ambulatory Visit: Payer: Self-pay | Admitting: Infectious Diseases

## 2020-05-06 DIAGNOSIS — Z5181 Encounter for therapeutic drug level monitoring: Secondary | ICD-10-CM

## 2020-05-06 MED ORDER — MOLNUPIRAVIR EUA 200MG CAPSULE: 4.0000 | ORAL_CAPSULE | Freq: Two times a day (BID) | ORAL | 0 refills | Status: AC

## 2020-05-06 MED FILL — MOLNUPIRAVIR 200 MG CAPS: 200 | 5 days supply | Qty: 40 | Fill #0

## 2020-05-06 NOTE — Telephone Encounter (Signed)
Per Dr Neldon Mc since patient is having issues with her [facility] will change over to [employer].  Approval done and new order sent to [facility] with call to them to contact patient after her trip 4/6

## 2020-05-07 LAB — BASIC METABOLIC PANEL
BUN: 14 mg/dL (ref 7–25)
CO2: 29 mmol/L (ref 20–32)
Calcium: 8.8 mg/dL (ref 8.6–10.4)
Chloride: 106 mmol/L (ref 98–110)
Creat: 0.8 mg/dL (ref 0.60–0.93)
Glucose, Bld: 65 mg/dL (ref 65–99)
Potassium: 4.8 mmol/L (ref 3.5–5.3)
Sodium: 140 mmol/L (ref 135–146)

## 2020-05-07 LAB — CBC
HCT: 33.1 % — ABNORMAL LOW (ref 35.0–45.0)
Hemoglobin: 11.2 g/dL — ABNORMAL LOW (ref 11.7–15.5)
MCH: 32.5 pg (ref 27.0–33.0)
MCHC: 33.8 g/dL (ref 32.0–36.0)
MCV: 95.9 fL (ref 80.0–100.0)
MPV: 11.3 fL (ref 7.5–12.5)
Platelets: 135 10*3/uL — ABNORMAL LOW (ref 140–400)
RBC: 3.45 10*6/uL — ABNORMAL LOW (ref 3.80–5.10)
RDW: 13.8 % (ref 11.0–15.0)
WBC: 5.2 10*3/uL (ref 3.8–10.8)

## 2020-05-09 DIAGNOSIS — Z20822 Contact with and (suspected) exposure to covid-19: Secondary | ICD-10-CM | POA: Diagnosis not present

## 2020-05-10 ENCOUNTER — Other Ambulatory Visit (HOSPITAL_BASED_OUTPATIENT_CLINIC_OR_DEPARTMENT_OTHER): Payer: Self-pay

## 2020-05-11 MED FILL — SULFAMETHOXAZOLE-TMP DS TAB: 800-160 | 30 days supply | Qty: 60 | Fill #1

## 2020-05-11 MED FILL — ATORVASTATIN 40 MG TABLET: 40 | 90 days supply | Qty: 90 | Fill #1

## 2020-05-20 ENCOUNTER — Other Ambulatory Visit (HOSPITAL_COMMUNITY): Payer: Self-pay

## 2020-05-21 ENCOUNTER — Other Ambulatory Visit (HOSPITAL_COMMUNITY): Payer: Self-pay

## 2020-05-21 MED ORDER — CEPHALEXIN 500 MG PO CAPS
ORAL_CAPSULE | ORAL | 0 refills | Status: DC
  Filled 2020-05-21: qty 32, 8d supply, fill #0

## 2020-05-23 ENCOUNTER — Other Ambulatory Visit (HOSPITAL_COMMUNITY): Payer: Self-pay

## 2020-05-23 ENCOUNTER — Telehealth: Payer: Self-pay | Admitting: Gastroenterology

## 2020-05-23 ENCOUNTER — Encounter: Payer: Medicare PPO | Admitting: Gastroenterology

## 2020-05-23 NOTE — Telephone Encounter (Signed)
Phoned pt to give her instructions for prep for time change.  LM for her to call back directly to admitting and ask for [place] or Lanelle Bal.

## 2020-05-23 NOTE — Telephone Encounter (Signed)
Spoke to pt and gave her updated instructions for prep time changes. Pt verbalized understanding.

## 2020-05-23 NOTE — Telephone Encounter (Signed)
Good morning!!  Patient calling to accept coming at 1:00pm for 2:00pm colon procedure with Dr. Bryan Lemma..   Thank you

## 2020-05-24 ENCOUNTER — Other Ambulatory Visit: Payer: Self-pay

## 2020-05-24 ENCOUNTER — Other Ambulatory Visit (HOSPITAL_COMMUNITY): Payer: Self-pay

## 2020-05-24 ENCOUNTER — Encounter: Payer: Self-pay | Admitting: Gastroenterology

## 2020-05-24 ENCOUNTER — Encounter: Payer: Self-pay | Admitting: Allergy and Immunology

## 2020-05-24 ENCOUNTER — Ambulatory Visit (AMBULATORY_SURGERY_CENTER): Payer: Medicare PPO | Admitting: Gastroenterology

## 2020-05-24 VITALS — BP 157/62 | HR 60 | Temp 97.5°F | Resp 13 | Ht 64.0 in | Wt 149.0 lb

## 2020-05-24 DIAGNOSIS — K640 First degree hemorrhoids: Secondary | ICD-10-CM | POA: Diagnosis not present

## 2020-05-24 DIAGNOSIS — R197 Diarrhea, unspecified: Secondary | ICD-10-CM

## 2020-05-24 DIAGNOSIS — K621 Rectal polyp: Secondary | ICD-10-CM | POA: Diagnosis not present

## 2020-05-24 DIAGNOSIS — K5150 Left sided colitis without complications: Secondary | ICD-10-CM | POA: Diagnosis not present

## 2020-05-24 DIAGNOSIS — K52832 Lymphocytic colitis: Secondary | ICD-10-CM

## 2020-05-24 DIAGNOSIS — D128 Benign neoplasm of rectum: Secondary | ICD-10-CM

## 2020-05-24 DIAGNOSIS — D129 Benign neoplasm of anus and anal canal: Secondary | ICD-10-CM

## 2020-05-24 DIAGNOSIS — K529 Noninfective gastroenteritis and colitis, unspecified: Secondary | ICD-10-CM | POA: Diagnosis not present

## 2020-05-24 MED ORDER — SODIUM CHLORIDE 0.9 % IV SOLN: 500.0000 mL | Freq: Once | INTRAVENOUS | Status: DC

## 2020-05-24 NOTE — Progress Notes (Signed)
PT taken to PACU. Monitors in place. VSS. Report given to RN. 

## 2020-05-24 NOTE — Op Note (Signed)
[facility] Patient Name: Beverly [name] Procedure Date: 05/24/2020 1:46 PM MRN: [id number] Endoscopist: Gerrit Heck , MD Age: 75 Referring MD:  Date of Birth: 05-12-1945 Gender: Female Account #: [id number] Procedure:                Colonoscopy Indications:              Diarrhea Medicines:                Monitored Anesthesia Care Procedure:                Pre-Anesthesia Assessment:                           - Prior to the procedure, a History and Physical                            was performed, and patient medications and                            allergies were reviewed. The patient's tolerance of                            previous anesthesia was also reviewed. The risks                            and benefits of the procedure and the sedation                            options and risks were discussed with the patient.                            All questions were answered, and informed consent                            was obtained. Prior Anticoagulants: The patient has                            taken no previous anticoagulant or antiplatelet                            agents. ASA Grade Assessment: III - A patient with                            severe systemic disease. After reviewing the risks                            and benefits, the patient was deemed in                            satisfactory condition to undergo the procedure.                           After obtaining informed consent, the colonoscope  was passed under direct vision. Throughout the                            procedure, the patient's blood pressure, pulse, and                            oxygen saturations were monitored continuously. The                            Olympus CF-HQ190L ([id number]) Colonoscope was                            introduced through the anus and advanced to the the                            cecum, identified by appendiceal orifice and                             ileocecal valve. The colonoscopy was technically                            difficult and complex due to significant looping.                            Successful completion of the procedure was aided by                            using manual pressure. The patient tolerated the                            procedure well. The quality of the bowel                            preparation was good. The terminal ileum, ileocecal                            valve, appendiceal orifice, and rectum were                            photographed. Scope In: 1:52:39 PM Scope Out: 2:15:04 PM Scope Withdrawal Time: 0 hours 16 minutes 47 seconds  Total Procedure Duration: 0 hours 22 minutes 25 seconds  Findings:                 The perianal and digital rectal examinations were                            normal.                           The colon (entire examined portion) appeared                            normal. Biopsies for histology were taken with a  cold forceps from the right colon and left colon                            for evaluation of microscopic colitis. Estimated                            blood loss was minimal.                           A 2 mm polyp was found in the distal rectum. The                            polyp was sessile. The polyp was removed with a                            cold biopsy forceps. Resection and retrieval were                            complete. Estimated blood loss was minimal.                           Non-bleeding internal hemorrhoids were found during                            retroflexion. The hemorrhoids were small.                           The terminal ileum appeared normal.                           The splenic flexure and ascending colon revealed                            moderately excessive looping. Advancing the scope                            required applying abdominal pressure. Complications:             No immediate complications. Estimated Blood Loss:     Estimated blood loss was minimal. Impression:               - The entire examined colon is normal. Biopsied.                           - One 2 mm polyp in the distal rectum, removed with                            a cold biopsy forceps. Resected and retrieved.                           - Non-bleeding internal hemorrhoids.                           - The examined portion of the ileum was normal.                           -  There was significant looping of the colon. Recommendation:           - Patient has a contact number available for                            emergencies. The signs and symptoms of potential                            delayed complications were discussed with the                            patient. Return to normal activities tomorrow.                            Written discharge instructions were provided to the                            patient.                           - Resume previous diet.                           - Continue present medications.                           - Await pathology results.                           - Use fiber, for example Citrucel, Fibercon, Konsyl                            or Metamucil.                           - Follow-up with Alonza Bogus, PA-C in the GI                            clinic at appointment to be scheduled. If biopsies                            are unrevealing and symptoms persist, recommend EGD                            to evaluate for small bowel pathology. Gerrit Heck, MD 05/24/2020 2:20:39 PM

## 2020-05-24 NOTE — Progress Notes (Signed)
Called to room to assist during endoscopic procedure.  Patient ID and intended procedure confirmed with present staff. Received instructions for my participation in the procedure from the performing physician.  

## 2020-05-24 NOTE — Patient Instructions (Signed)
Handouts Provided:  Polyps  YOU HAD AN ENDOSCOPIC PROCEDURE TODAY AT THE [facility]:   Refer to the procedure report that was given to you for any specific questions about what was found during the examination.  If the procedure report does not answer your questions, please call your gastroenterologist to clarify.  If you requested that your care partner not be given the details of your procedure findings, then the procedure report has been included in a sealed envelope for you to review at your convenience later.  YOU SHOULD EXPECT: Some feelings of bloating in the abdomen. Passage of more gas than usual.  Walking can help get rid of the air that was put into your GI tract during the procedure and reduce the bloating. If you had a lower endoscopy (such as a colonoscopy or flexible sigmoidoscopy) you may notice spotting of blood in your stool or on the toilet paper. If you underwent a bowel prep for your procedure, you may not have a normal bowel movement for a few days.  Please Note:  You might notice some irritation and congestion in your nose or some drainage.  This is from the oxygen used during your procedure.  There is no need for concern and it should clear up in a day or so.  SYMPTOMS TO REPORT IMMEDIATELY:   Following lower endoscopy (colonoscopy or flexible sigmoidoscopy):  Excessive amounts of blood in the stool  Significant tenderness or worsening of abdominal pains  Swelling of the abdomen that is new, acute  Fever of 100F or higher  For urgent or emergent issues, a gastroenterologist can be reached at any hour by calling [phone]. Do not use MyChart messaging for urgent concerns.    DIET:  We do recommend a small meal at first, but then you may proceed to your regular diet.  Drink plenty of fluids but you should avoid alcoholic beverages for 24 hours.  ACTIVITY:  You should plan to take it easy for the rest of today and you should NOT DRIVE or use heavy  machinery until tomorrow (because of the sedation medicines used during the test).    FOLLOW UP: Our staff will call the number listed on your records 48-72 hours following your procedure to check on you and address any questions or concerns that you may have regarding the information given to you following your procedure. If we do not reach you, we will leave a message.  We will attempt to reach you two times.  During this call, we will ask if you have developed any symptoms of COVID 19. If you develop any symptoms (ie: fever, flu-like symptoms, shortness of breath, cough etc.) before then, please call [phone].  If you test positive for Covid 19 in the 2 weeks post procedure, please call and report this information to us.    If any biopsies were taken you will be contacted by phone or by letter within the next 1-3 weeks.  Please call us at [phone] if you have not heard about the biopsies in 3 weeks.    SIGNATURES/CONFIDENTIALITY: You and/or your care partner have signed paperwork which will be entered into your electronic medical record.  These signatures attest to the fact that that the information above on your After Visit Summary has been reviewed and is understood.  Full responsibility of the confidentiality of this discharge information lies with you and/or your care-partner.  

## 2020-05-27 ENCOUNTER — Ambulatory Visit: Payer: Medicare PPO | Admitting: Dermatology

## 2020-05-27 ENCOUNTER — Other Ambulatory Visit: Payer: Self-pay

## 2020-05-27 ENCOUNTER — Other Ambulatory Visit: Payer: Self-pay | Admitting: Infectious Diseases

## 2020-05-27 ENCOUNTER — Encounter: Payer: Self-pay | Admitting: Dermatology

## 2020-05-27 DIAGNOSIS — C44719 Basal cell carcinoma of skin of left lower limb, including hip: Secondary | ICD-10-CM | POA: Diagnosis not present

## 2020-05-27 DIAGNOSIS — D0439 Carcinoma in situ of skin of other parts of face: Secondary | ICD-10-CM | POA: Diagnosis not present

## 2020-05-27 DIAGNOSIS — L570 Actinic keratosis: Secondary | ICD-10-CM

## 2020-05-27 DIAGNOSIS — D485 Neoplasm of uncertain behavior of skin: Secondary | ICD-10-CM

## 2020-05-27 DIAGNOSIS — L821 Other seborrheic keratosis: Secondary | ICD-10-CM | POA: Diagnosis not present

## 2020-05-27 DIAGNOSIS — Z1283 Encounter for screening for malignant neoplasm of skin: Secondary | ICD-10-CM | POA: Diagnosis not present

## 2020-05-27 NOTE — Patient Instructions (Signed)

## 2020-05-28 ENCOUNTER — Other Ambulatory Visit (HOSPITAL_COMMUNITY): Payer: Self-pay

## 2020-05-28 ENCOUNTER — Telehealth: Payer: Self-pay

## 2020-05-28 ENCOUNTER — Other Ambulatory Visit: Payer: Self-pay | Admitting: Infectious Diseases

## 2020-05-28 ENCOUNTER — Telehealth: Payer: Self-pay | Admitting: *Deleted

## 2020-05-28 DIAGNOSIS — M65312 Trigger thumb, left thumb: Secondary | ICD-10-CM | POA: Diagnosis not present

## 2020-05-28 MED ORDER — DIPHENOXYLATE-ATROPINE 2.5-0.025 MG PO TABS
1.0000 | ORAL_TABLET | Freq: Four times a day (QID) | ORAL | 0 refills | Status: DC | PRN
  Filled 2020-05-28: qty 30, 8d supply, fill #0

## 2020-05-28 NOTE — Telephone Encounter (Signed)
NO ANSWER, unable to LM

## 2020-05-28 NOTE — Telephone Encounter (Signed)
  Follow up Call-  Call back number 05/24/2020  Post procedure Call Back phone  # [phone]  Permission to leave phone message Yes  Some recent data might be hidden    No answer, no machine

## 2020-05-28 NOTE — Telephone Encounter (Signed)
RN spoke to patient to notify her that Lomotil has been sent to her pharmacy. Patient verbalized understanding and has no further questions.   Beryle Flock, RN

## 2020-05-28 NOTE — Telephone Encounter (Signed)
-----   Message from Rosiland Oz, MD sent at 05/28/2020  1:36 PM EDT ----- Regarding: Prescriptions Lomotil prescription sent to [facility] per patient's request

## 2020-05-29 ENCOUNTER — Other Ambulatory Visit: Payer: Self-pay

## 2020-05-29 ENCOUNTER — Encounter: Payer: Self-pay | Admitting: Infectious Diseases

## 2020-05-29 ENCOUNTER — Ambulatory Visit: Payer: Medicare PPO | Admitting: Infectious Diseases

## 2020-05-29 VITALS — BP 137/73 | HR 58 | Temp 97.8°F | Wt 145.0 lb

## 2020-05-29 DIAGNOSIS — Z5181 Encounter for therapeutic drug level monitoring: Secondary | ICD-10-CM

## 2020-05-29 DIAGNOSIS — A439 Nocardiosis, unspecified: Secondary | ICD-10-CM

## 2020-05-29 DIAGNOSIS — K529 Noninfective gastroenteritis and colitis, unspecified: Secondary | ICD-10-CM

## 2020-05-29 NOTE — Progress Notes (Signed)
[facility]                                                             [address], [place], [place], [address]                                                                  Phn. [phone]; Fax: [phone]                                                                             Date: 05/29/20  Reason for Referral: Follow up for Nocardia pneumonia and diarrhea   Assessment/Plan Problem List Items Addressed This Visit      Other   Nocardia infection - Primary   Relevant Orders   CBC   Comprehensive metabolic panel   Medication monitoring encounter    Other Visit Diagnoses    Chronic diarrhea          Beverly [name] a 75 year old female with a history of mild persistent asthma,GERD,Bronchiectasis, OSA, Tracheomalacia, Primary immunodeficiency on immunoglobulin therapy, Rt reverse shoulder arthroplasty, skin malignancy who was diagnosed with Nocardia nova PNA with positive sputum cx on 07/21/19 and was started on Bactrim therapy ( 15mg /kg/day) from 10/25/19. She initially had nausea/vomiting with bactrim which was managed with Zofran prn. However, she has developed an AKI with Cr of 1.24 from 0.69. The dosing of Bactrim was reduced to Bactrim 1 DS tab po BID in clinic visit on 11/29/19 and her Cr seems to have been WNL since then. Bactrim was hold on 1/25 given concerns of diarrhea being related to Bactrim and was resumed on 3/10 as diarrhea continued with holding Bactrim. Diarrhea continued even while she was off of IVIG in end of March and  beginning of April 2022.  C diff *2, Salmonella, Shigella, Cryptosporidium EIA negative, Stool ova and parasite exam negative. Stool leukocytes negative. She is s/p Colonoscopy and results are pending 4/8. She is symptomatically managing her diarrhea with Imodium.   In terms of respiratory symptoms, she feels cough has improved significantly.  Breathing has been the same. She feels SOB on talking but not on walking or doing household chores. She definitely thinks her respiratory symptoms has improved with bactrim tx. She has completed approx one month of bactrim treatment from 3/10 till today and technically completes 6 months of tx for Nocardia PNA altogether. She will finish her remaining pills and will stop bactrim after that  Follow up for management of GERD/Asthma/LPR and Hypogammaglobulinemia per Pulmonary and Immunology  Follow up with GI for management of chronic diarrhea/colonoscopy results  Follow up with me as needed   All questions and concerns were discussed and addressed. Patient verbalized understanding of the plan. ____________________________________________________________________________________________________________________ Subjective/Interval events 02/20/20 She  says she has not had as much mucous plugs as she had before and it is more stringy stuff that comes out. Cough is usually  in the morning and more when she  moves around. She feels the bactrim has helped to get the phlegm out. However, she has not noticed any significant improvement in terms of SOB.   She says she was able to talk with her daughter around 45 minutes before sept 2020 but since that time she feels she gets SOB even when talking for 5-10 minutes with her daughter. She feels the SOB has stayed the same since September 2020.  Denies any fever/chills but she says she sweats always. Appetite is good. No changes in weight. No N/V/D No urinary symptoms.  She feels tired but says the fatigue has gotten better as she does not need to sleep in the day as much as she used to before and takes a brief nap.   She is following up with Pulm and is also getting weekly IV immunoglobulins from her immunologist.  03/12/20 She has been having loose stool since January 8, which initially started as 3-4 episodes a day and was watery. However, it continued to  get worse and started having 5-6 BMs a day. At one point ( Jan 20th), she stopped taking all her meds including bactrim and she felt her diarrhea got somewhat better. She stopped it for 3 days and started taking PO Vancomycin around 1/18 which she has been taking until now. However, she felt the diarrhea came back right after she started taking all her meds including bactrim.   Denies any fevers, chills, sweats Denies nausea/vomiting.abdominal cramps/GU symptoms Denies recent travel, sick contact, outside food, family member having diarrhea, no new medications   In terms of cough, she feels its getting better, she has less mucous plugs. SOB has not completely gone away, but thinks there is some improvement. She does not seem to have SOB while talking to some one but if she talks for a longer time she feels SOB. She thinks she has lost  7 lbs since September 2021.    03/27/20 Beverly Fry Estridgeis a 75 year old female with a history of mild persistent asthma,GERD,Bronchiectasis, OSA, Tracheomalacia, Primary immunodeficiency on immunoglobulin therapy, Rt reverse shoulder arthroplasty, skin malignancy here for follow up of Nocardia PNA and diarrhea. She has completed approx 5 months of bactrim therapy for Nocardia PNA. Bactrim was stopped last visit due to diarrhea that was thought to be related to bactrim. C diff was negative.   She says her diarrhea got better since the bactrim was stopped and was doing well until this past Monday when she had few episodes of loose stool and started having solid stool from Tuesday. She says she is also going through some family issues and unsure if the diarrhea is related to it as it only lasted for 1 day. She also says that she saw some blood in the stool. She says last colonoscopy was 7 years ago. Denies any fevers, chills, sweats, abdominal cramps. Denies nausea, vomiting. Appetite is good. No new medicine or new food intake.   In terms of respiratory standpoint,  she says cough is better 2-3 times a day. Phlegm is also less. SOB has been stable. She get SOB when talking for a long period of time.    05/29/20 Here for follow up of Nocardia pneumonia. She restarted bactrim on 3/10 after it was noticed that there was no improvement in her diarrhea after being  off of bactrim. She has not noticed any changes in diarrhea since it started. Says every time she eats, she has 1-2 episodes of diarrhea if she does not take Imodium. She is taking 2 Imodium three times a day. She recently went to [place] for vacation on march 30 and came back a week ago. She was taken off of IVIG by Dr Neldon Mc and says she did not notice any improvement in the diarrhea. Stool is watery, foul smelling but no blood. Denies fevers, chils, nausea, vomiting and abdominal pain. She was also seen by GI and had colonoscopy done 4/8 results are pending. She says she has lost weight due to diarrhea.   In terms of respiratory symptoms, she is coughing less than before, produces mucous plugs which are less thick and less viscous. Phlegm is yellow in color Breathing has been the same. She mostly gets SOB when she talks but says she was able to walk without any difficulty when she recently went to [place]. She is able to do 30-45 minutes yard work at home. She is willing to trial Lomotil to help with her diarrhea. I discussed side effects and need to be cautious with its use and preferably take it before sleep.   ROS: 12 point ROS done with pertinent positives and negatives listed above  Past Medical History:  Diagnosis Date  . Allergy   . Amaurosis fugax    negative w/u through [facility]  . Asthma   . Back pain    Dr Joline Salt 02/2010-epidural injection x 2 at L4-5 with good effect  . 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 BCC (basal cell carcinoma of skin) 05/09/2014   mid lower back  . BCC (basal cell carcinoma of skin) 05/03/2017   right low back  . BCC (basal cell carcinoma of  skin) 05/03/2017   left upper back  . BCC (basal cell carcinoma of skin) 07/05/2018   left mid back  . BCC (basal cell carcinoma of skin) 05/20/1992   upper back  . BCC (basal cell carcinoma of skin) 07/29/1993   left sholder medial  . BCC (basal cell carcinoma of skin) 07/29/1993   left sholder lateral  . BCC (basal cell carcinoma of skin) 07/29/1993   right thigh  . BCC (basal cell carcinoma of skin) 07/29/1993   right sholder  . BCC (basal cell carcinoma of skin) 12/22/1994   right mid forearm  . BCC (basal cell carcinoma of skin) 12/22/1994   right upper forearm  . BCC (basal cell carcinoma of skin) 12/22/1994   lower right upper arm  . BCC (basal cell carcinoma of skin) 12/22/1994   right upper arm sholder  . BCC (basal cell carcinoma of skin) 08/11/1995   left leg below knee  . BCC (basal cell carcinoma of skin) 04/11/2002   mid back  . BCC (basal cell carcinoma of skin) 12/10/2002   right center upper back  . BCC (basal cell carcinoma of skin) 05/26/2005   right post sholder  . BCC (basal cell carcinoma) 05/09/2014   left inner shin  . BCC (basal cell carcinoma) 06/12/2014   left forearm  . Bowen's disease 10/07/1994   right post knee, right inner forearm/wrist  . Bowen's disease 08/11/1995   left sholder  . Cataract    left  . Chronic pain   . Depression   . Diverticulosis of colon 1998   mild  . DJD (degenerative joint disease)   . Duodenal ulcer  h/o  . GERD (gastroesophageal reflux disease)   . History of SCC (squamous cell carcinoma) of skin    Dr. Denna Haggard  . History of sinus bradycardia   . HLD (hyperlipidemia)    hypertriglyceridemia  . Hypertensive retinopathy of both eyes 11/18/2017  . IBS (irritable bowel syndrome)   . Internal hemorrhoids 1998  . Mitral regurgitation    mild  . Osteoarthritis    feet,shoulder,neck,back,hips and hands.  . Pneumonia   . PONV (postoperative nausea and vomiting)   . Rotator cuff tear, right 02/2019    infraspinatus and supraspinatus, and dislocation of long head of bicep tendon (Dr. Alvan Dame)  . SCC (squamous cell carcinoma) 11/26/2014   left hand, right hand, right deltoid  . SCC (squamous cell carcinoma) 05/03/2017   left cheek  . Sleep apnea    uses a mouth guard nightly  . Squamous cell carcinoma in situ (SCCIS) 07/05/2018   left hand  . Vitamin D deficiency    mild   Past Surgical History:  Procedure Laterality Date  . ABDOMINAL HYSTERECTOMY    . BLEPHAROPLASTY Bilateral 01/2018  . BROW LIFT Bilateral 10/10/2018   Procedure: BROW LIFT;  Surgeon: Wallace Going, DO;  Location: [facility];  Service: Plastics;  Laterality: Bilateral;  . BUNIONECTOMY     R 12/08, L 2004 (Dr. Janus Molder)  . CARPAL TUNNEL RELEASE  1989   bilateral  . CATARACT EXTRACTION Bilateral Left in 08/2012, Right 04/2016   Dr.Hecker  . CESAREAN SECTION     x2  . CHOLECYSTECTOMY  1992  . CLOSED REDUCTION NASAL FRACTURE N/A 08/29/2018   Procedure: CLOSED REDUCTION NASAL FRACTURE;  Surgeon: Wallace Going, DO;  Location: [facility];  Service: Plastics;  Laterality: N/A;  1 hour, please  . COLONOSCOPY  2006  . COLONOSCOPY  01/2012   due again 01/2022; mild diverticulosis  . epidural steroid injection, back  02/2010  . HIP SURGERY     right bursectomy x 3  . HIP SURGERY Right 2000   torn cartilage, repaired  . INGUINAL HERNIA REPAIR  9/09   bilat  . NECK SURGERY  1989   c6-7 cervical laminectomy and diskecotmy  . NECK SURGERY  1989   per patient posterior area of neck  . REVERSE SHOULDER ARTHROPLASTY Right 04/11/2019   Procedure: REVERSE SHOULDER ARTHROPLASTY;  Surgeon: Nicholes Stairs, MD;  Location: [facility];  Service: Orthopedics;  Laterality: Right;  Reg[facility] Block  . SHOULDER SURGERY  2/05   left rotator cuff repair  . SHOULDER SURGERY Left 03/14/2018   rotator cuff repair; Dr. Tonita Cong  . TONSILLECTOMY  age 19  . TOTAL ABDOMINAL HYSTERECTOMY W/ BILATERAL  SALPINGOOPHORECTOMY  1997   fibroids  . TROCHANTERIC BURSA EXCISION Right 1978  . TUBAL LIGATION  1986  . WISDOM TOOTH EXTRACTION     Current Outpatient Medications on File Prior to Visit  Medication Sig Dispense Refill  . acetaminophen (TYLENOL) 500 MG tablet Take 1,000 mg by mouth every 6 (six) hours as needed for moderate pain.     Marland Kitchen albuterol (PROVENTIL HFA;VENTOLIN HFA) 108 (90 Base) MCG/ACT inhaler Inhale 2 puffs into the lungs every 6 (six) hours as needed for wheezing or shortness of breath. 18 g 1  . aspirin EC 81 MG tablet Take 81 mg by mouth daily.    Marland Kitchen atorvastatin (LIPITOR) 40 MG tablet TAKE 1 TABLET BY MOUTH ONCE A DAY 90 tablet 1  . Calcium Carbonate-Vitamin D 600-200 MG-UNIT TABS  Take 1 tablet by mouth 2 (two) times daily.    . celecoxib (CELEBREX) 200 MG capsule TAKE 1 CAPSULE BY MOUTH 2 TIMES DAILY 180 capsule 1  . diclofenac Sodium (VOLTAREN) 1 % GEL Apply 2 g topically 4 (four) times daily as needed (joint pain).    Marland Kitchen diphenoxylate-atropine (LOMOTIL) 2.5-0.025 MG tablet Take 1 tablet by mouth 4 (four) times daily as needed for diarrhea or loose stools. 30 tablet 0  . esomeprazole (NEXIUM) 40 MG capsule TAKE 1 CAPSULE BY MOUTH DAILY IN THE MORNING 90 capsule 1  . famotidine (PEPCID) 40 MG tablet TAKE 1 TABLET BY MOUTH AT BEDTIME. 90 tablet 1  . gabapentin (NEURONTIN) 800 MG tablet TAKE 1 TABLET BY MOUTH ONCE DAILY AT BEDTIME 90 tablet 2  . HYDROcodone-acetaminophen (NORCO) 7.5-325 MG tablet Take 1 tablet by mouth every 12 (twelve) hours as needed for moderate pain. Dr Hardin Negus    . Melatonin-Pyridoxine (MELATIN PO)     . methocarbamol (ROBAXIN) 500 MG tablet Take 500 mg by mouth in the morning and at bedtime.     . montelukast (SINGULAIR) 10 MG tablet TAKE 1 TABLET BY MOUTH AT BEDTIME. 90 tablet 1  . Multiple Vitamins-Minerals (MULTIVITAMIN WITH MINERALS) tablet Take 1 tablet by mouth daily.    Marland Kitchen omega-3 acid ethyl esters (LOVAZA) 1 g capsule TAKE 2 CAPSULES BY MOUTH 2  TIMES DAILY 180 capsule 1  . ondansetron (ZOFRAN) 4 MG tablet TAKE 1 TABLET BY MOUTH EVERY 8 HOURS AS NEEDED FOR NAUSEA AND/OR VOMITING 60 tablet 2  . PARoxetine (PAXIL) 30 MG tablet TAKE 2 TABLETS BY MOUTH ONCE A DAY 180 tablet 1  . Respiratory Therapy Supplies (FLUTTER) DEVI 1 puff by Does not apply route 2 (two) times daily. 1 each 0  . sodium chloride HYPERTONIC 3 % nebulizer solution Take by nebulization in the morning and at bedtime. 750 mL 12  . sulfamethoxazole-trimethoprim (BACTRIM DS) 800-160 MG tablet TAKE 1 TABLET BY MOUTH 2 TIMES DAILY 60 tablet 2  . triamcinolone (NASACORT) 55 MCG/ACT AERO nasal inhaler Place 2 sprays into the nose daily.    . Buprenorphine HCl 600 MCG FILM DISSOLVE 1 FILM INSIDE OF MOUTH EVERY 12 HOURS 60 each 2  . Fluorouracil 4 % CREA Apply 1 application topically every 3 (three) days. Dr Denna Haggard (Patient not taking: No sig reported)    . GUAIFENESIN ER PO Take 400 mg by mouth 2 (two) times daily.    . [DISCONTINUED] colestipol (COLESTID) 1 g tablet 1/2-1 tablet 1 time per day 30 tablet 3  . [DISCONTINUED] potassium chloride (KLOR-CON) 10 MEQ tablet Take 2 tablets (total 20 MEQ) by mouth once 2 tablet 0   No current facility-administered medications on file prior to visit.     Allergies  Allergen Reactions  . Bactrim [Sulfamethoxazole-Trimethoprim] Diarrhea and Nausea And Vomiting    Severe nausea, vomiting, and diarrhea  . Adhesive [Tape] Rash  . Codeine Rash   Social History   Socioeconomic History  . Marital status: Divorced    Spouse name: Not on file  . Number of children: 2  . Years of education: Not on file  . Highest education level: Not on file  Occupational History  . Occupation: STAFF Optician, dispensing: [employer]  Tobacco Use  . Smoking status: Never Smoker  . Smokeless tobacco: Never Used  Vaping Use  . Vaping Use: Never used  Substance and Sexual Activity  . Alcohol use: Yes  Alcohol/week: 7.0 standard drinks     Types: 7 Glasses of wine per week    Comment: 1 glass of wine daily  . Drug use: No  . Sexual activity: Not Currently  Other Topics Concern  . 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   [place] for [employer] part-time   Social Determinants of Health   Financial Resource Strain: Not on [employer] Insecurity: Not on file  Transportation Needs: Not on file  Physical Activity: Not on file  Stress: Not on file  Social Connections: Not on file  Intimate Partner Violence: Not on file    Vitals BP 137/73   Pulse (!) 58   Temp 97.8 F (36.6 C) (Oral)   Wt 145 lb (65.8 kg)   SpO2 100%   BMI 24.89 kg/m    Examination  General - not in acute distress, comfortably sitting in chair HEENT - PEERLA, no pallor and no icterus Chest - b/l clear air entry, no additional sounds CVS- Normal s1s2, RRR Abdomen - Soft, Non tender , non distended Ext- no pedal edema Neuro: grossly normal Back - WNL Psych : calm and cooperative   Recent labs CBC Latest Ref Rng & Units 05/06/2020 03/28/2020 03/12/2020  WBC 3.8 - 10.8 Thousand/uL 5.2 4.3 7.9  Hemoglobin 11.7 - 15.5 g/dL 11.2(L) 11.8 11.8  Hematocrit 35.0 - 45.0 % 33.1(L) 35.9 35.6  Platelets 140 - 400 Thousand/uL 135(L) 123(L) 171   CMP Latest Ref Rng & Units 05/06/2020 03/12/2020 02/20/2020  Glucose 65 - 99 mg/dL 65 69 70  BUN 7 - 25 mg/dL 14 13 19   Creatinine 0.60 - 0.93 mg/dL 0.80 0.73 0.92  Sodium 135 - 146 mmol/L 140 141 139  Potassium 3.5 - 5.3 mmol/L 4.8 3.3(L) 3.9  Chloride 98 - 110 mmol/L 106 104 105  CO2 20 - 32 mmol/L 29 28 28   Calcium 8.6 - 10.4 mg/dL 8.8 9.2 9.0  Total Protein 6.1 - 8.1 g/dL - 5.8(L) 6.2  Total Bilirubin 0.2 - 1.2 mg/dL - 0.2 0.3  Alkaline Phos 39 - 117 IU/L - - -  AST 10 - 35 U/L - 27 32  ALT 6 - 29 U/L - 20  67(H)    Pertinent Microbiology Results for orders placed or performed in visit on 04/01/20  Ova and parasite examination     Status: None   Collection Time: 04/01/20 11:54 AM   Specimen: Stool  Result Value Ref Range Status   MICRO NUMBER: [id number]  Final   SPECIMEN QUALITY: Adequate  Final   Source STOOL  Final   STATUS: FINAL  Final   CONCENTRATE RESULT: No ova or parasites seen  Final   TRICHROME RESULT: No ova or parasites seen  Final   COMMENT:   Final    Routine Ova and Parasite exam may not detect some parasites that occasionally cause diarrheal illness. Cryptosporidium Antigen and/or Cyclospora and Isospora Exam may be ordered to detect these parasites. One negative sample does not necessarily rule out  the presence of a parasitic infection.  For additional information, please refer to https://education.[facility].com/faq/FAQ203 (This link is being provided for informational/ educational purposes only.)   Fecal leukocytes     Status: None   Collection Time: 04/01/20 11:54 AM   Specimen: Stool  Result Value Ref Range Status  Micro Number [id number]  Final   Specimen Quality Adequate  Final   Source STOOL  Final   Status FINAL  Final   Result Not Detected  Final    Comment: Reference Range:Not Detected   Pertinent Imaging All pertinent labs/Imagings/notes reviewed. All pertinent plain films and CT images have been personally visualized and interpreted; radiology reports have been reviewed. Decision making incorporated into the Impression / Recommendations.  I have spent 60 minutes for this patient encounter including  review of prior medical records with greater than 50% of time in face to face counsel of the patient/discussing diagnostics and plan of care.   Electronically signed by:  Rosiland Oz, MD Infectious Disease Physician [facility] [address] [place], [place] [address] Phone: [phone]   Fax: [phone]

## 2020-05-30 LAB — CBC
HCT: 33.6 % — ABNORMAL LOW (ref 35.0–45.0)
Hemoglobin: 10.6 g/dL — ABNORMAL LOW (ref 11.7–15.5)
MCH: 30.7 pg (ref 27.0–33.0)
MCHC: 31.5 g/dL — ABNORMAL LOW (ref 32.0–36.0)
MCV: 97.4 fL (ref 80.0–100.0)
MPV: 10.8 fL (ref 7.5–12.5)
Platelets: 171 10*3/uL (ref 140–400)
RBC: 3.45 10*6/uL — ABNORMAL LOW (ref 3.80–5.10)
RDW: 13.8 % (ref 11.0–15.0)
WBC: 4.6 10*3/uL (ref 3.8–10.8)

## 2020-05-30 LAB — COMPREHENSIVE METABOLIC PANEL
AG Ratio: 2.1 (calc) (ref 1.0–2.5)
ALT: 13 U/L (ref 6–29)
AST: 20 U/L (ref 10–35)
Albumin: 3.7 g/dL (ref 3.6–5.1)
Alkaline phosphatase (APISO): 93 U/L (ref 37–153)
BUN: 10 mg/dL (ref 7–25)
CO2: 26 mmol/L (ref 20–32)
Calcium: 8.5 mg/dL — ABNORMAL LOW (ref 8.6–10.4)
Chloride: 107 mmol/L (ref 98–110)
Creat: 0.71 mg/dL (ref 0.60–0.93)
Globulin: 1.8 g/dL (calc) — ABNORMAL LOW (ref 1.9–3.7)
Glucose, Bld: 80 mg/dL (ref 65–99)
Potassium: 4 mmol/L (ref 3.5–5.3)
Sodium: 141 mmol/L (ref 135–146)
Total Bilirubin: 0.3 mg/dL (ref 0.2–1.2)
Total Protein: 5.5 g/dL — ABNORMAL LOW (ref 6.1–8.1)

## 2020-05-30 MED FILL — Buprenorphine HCl Buccal Film 600 MCG (Base Equivalent): BUCCAL | 30 days supply | Qty: 60 | Fill #0 | Status: AC

## 2020-05-31 ENCOUNTER — Other Ambulatory Visit (HOSPITAL_COMMUNITY): Payer: Self-pay

## 2020-05-31 MED FILL — Famotidine Tab 40 MG: ORAL | 90 days supply | Qty: 90 | Fill #0 | Status: AC

## 2020-06-01 ENCOUNTER — Other Ambulatory Visit (HOSPITAL_COMMUNITY): Payer: Self-pay

## 2020-06-02 ENCOUNTER — Other Ambulatory Visit: Payer: Self-pay | Admitting: Allergy and Immunology

## 2020-06-03 ENCOUNTER — Other Ambulatory Visit (HOSPITAL_COMMUNITY): Payer: Self-pay

## 2020-06-03 DIAGNOSIS — J310 Chronic rhinitis: Secondary | ICD-10-CM | POA: Diagnosis not present

## 2020-06-03 DIAGNOSIS — G4733 Obstructive sleep apnea (adult) (pediatric): Secondary | ICD-10-CM | POA: Diagnosis not present

## 2020-06-03 DIAGNOSIS — J479 Bronchiectasis, uncomplicated: Secondary | ICD-10-CM | POA: Diagnosis not present

## 2020-06-03 NOTE — Telephone Encounter (Signed)
Please advise to refill as pt states she is taking it 3 times per day

## 2020-06-04 MED ORDER — COLESTIPOL HCL 1 G PO TABS: ORAL_TABLET | ORAL | 3 refills | Status: DC

## 2020-06-04 NOTE — Telephone Encounter (Signed)
Refills sent in

## 2020-06-04 NOTE — Addendum Note (Signed)
Addended by: Guy Franco on: 06/04/2020 10:08 AM   Modules accepted: Orders

## 2020-06-05 ENCOUNTER — Telehealth: Payer: Self-pay | Admitting: General Surgery

## 2020-06-05 ENCOUNTER — Telehealth: Payer: Self-pay | Admitting: *Deleted

## 2020-06-05 NOTE — Telephone Encounter (Signed)
Left a voicemail for the patient to contact the office to discuss colonospcopy results

## 2020-06-05 NOTE — Telephone Encounter (Signed)
-----   Message from Lavonna Monarch, MD sent at 06/05/2020  5:47 AM EDT ----- Please schedule surgery for the cheek lesion with me; I will decide at that time whether schedule the leg with me or with [facility]

## 2020-06-05 NOTE — Telephone Encounter (Signed)
The patient called back and we went over her medications and the colonoscopy findings, the patient stated she had already seen it on my chart. She is still on immodium and it is not working. She would like the budesonide sent to [facility] out patient pharmacy and will be back in town next week from vacation which will give [place] enough time to seek a PA if needed.

## 2020-06-05 NOTE — Telephone Encounter (Signed)
-----   Message from [place], DO sent at 06/05/2020  1:16 PM EDT ----- The polyp resected was benign mucosal prolapse.  There is no evidence of dysplasia.  Can consider repeat colonoscopy in 10 years for routine screening purposes.  The biopsies from your recent colonoscopy were notable for Microscopic Colitis (Lymphocytic Colitis).  This is a chronic inflammatory condition that is typically due to medications but can also be associated with other autoimmune diseases (i.e. diabetes, thyroid disorders, celiac disease, etc.).  Treatment of this condition should be as follows  -Review for associated medications, most commonly NSAIDs, PPIs, ranitidine, statins, SSRIs. -If currently smoking, strongly recommend smoking cessation -Start with loperamide for mild diarrhea (<3 stools daily).  If nocturnal symptoms are worst, start this nightly. -In patients with >3 stools daily, or no improvement with loperamide, start budesonide 9 mg daily for 6 to 8 weeks then taper to 6 mg x 2 weeks, 3 mg x 2 weeks, discontinue.  If symptoms persist or recur during tapering, continue budesonide 9 mg x 12 weeks then slowly taper. -Symptomatic relapse occurs in up to 80% after budesonide treatment which can sometimes be controlled with budesonide 3 to 6 mg/day x 6 to 12 months versus intermittent retreatment courses of 6-8 weeks. -In very rare, severe cases, immunosuppressive therapy may be entertained

## 2020-06-05 NOTE — Telephone Encounter (Signed)
Pathology report to patient. Made surgery appointment with Dr.Tafeen.

## 2020-06-06 ENCOUNTER — Encounter: Payer: Self-pay | Admitting: Dermatology

## 2020-06-06 ENCOUNTER — Other Ambulatory Visit (HOSPITAL_COMMUNITY): Payer: Self-pay

## 2020-06-06 MED ORDER — BUDESONIDE ER 9 MG PO CP24
ORAL_CAPSULE | ORAL | 0 refills | Status: DC
  Filled 2020-06-06: qty 30, 30d supply, fill #0
  Filled 2020-07-15: qty 30, 30d supply, fill #1

## 2020-06-06 NOTE — Progress Notes (Signed)
   Follow-Up Visit   Subjective  Beverly [name] is a 75 y.o. female who presents for the following: Annual Exam (Patient here today for yearly skin check. Per patient she has a lesion on her left hand that was previously bx and it's still there. Per patient the lesion is painful at times, no bleeding.  Check lesion in front of the left ear lobe x 9 months no bleeding it does crust over then comes off. Lesion on left inner thigh x 1 year bleeding sometimes, it does itch, no pain.).  General skin examination with some specific newer lesions to be examined.  Of greater concern is a general trend towards her loss of wellness, mostly  related to a diagnosis of nocardial pulmonary infection. Location:  Duration:  Quality:  Associated Signs/Symptoms: Modifying Factors:  Severity:  Timing: Context:   Objective  Well appearing patient in no apparent distress; mood and affect are within normal limits. Objective  Scalp: Full body skin check.  Objective  Mid Back: Multiple  Left Lower Leg - Anterior     Left Malar Cheek       Head to toe skin examination; areas beneath undergarments not fully examined.   Assessment & Plan    Screening exam for skin cancer Scalp  Yearly skin check.  Seborrheic keratosis Mid Back  Stable to leave  Neoplasm of uncertain behavior of skin (2) Left Lower Leg - Anterior  Skin / nail biopsy Type of biopsy: tangential   Informed consent: discussed and consent obtained   Timeout: patient name, date of birth, surgical site, and procedure verified   Procedure prep:  Patient was prepped and draped in usual sterile fashion (Non sterile) Prep type:  Chlorhexidine Anesthesia: the lesion was anesthetized in a standard fashion   Anesthetic:  1% lidocaine w/ epinephrine 1-100,000 local infiltration Instrument used: flexible razor blade   Outcome: patient tolerated procedure well   Post-procedure details: wound care instructions given     Specimen 1 - Surgical pathology Differential Diagnosis: bcc vs scc  Check Margins: No  Left Malar Cheek  Skin / nail biopsy Type of biopsy: tangential   Informed consent: discussed and consent obtained   Timeout: patient name, date of birth, surgical site, and procedure verified   Procedure prep:  Patient was prepped and draped in usual sterile fashion (Non sterile) Prep type:  Chlorhexidine Anesthesia: the lesion was anesthetized in a standard fashion   Anesthetic:  1% lidocaine w/ epinephrine 1-100,000 local infiltration Instrument used: flexible razor blade   Outcome: patient tolerated procedure well   Post-procedure details: wound care instructions given    Specimen 2 - Surgical pathology Differential Diagnosis: bcc vs scc  Check Margins: No  AK (actinic keratosis) (2) Left Forearm - Anterior; Left Preauricular Area  Destruction of lesion - Left Forearm - Anterior, Left Preauricular Area Complexity: simple   Destruction method: cryotherapy   Informed consent: discussed and consent obtained   Timeout:  patient name, date of birth, surgical site, and procedure verified Lesion destroyed using liquid nitrogen: Yes   Cryotherapy cycles:  5 Outcome: patient tolerated procedure well with no complications   Post-procedure details: wound care instructions given        I, Lavonna Monarch, MD, have reviewed all documentation for this visit.  The documentation on 06/06/20 for the exam, diagnosis, procedures, and orders are all accurate and complete.

## 2020-06-12 ENCOUNTER — Other Ambulatory Visit (HOSPITAL_COMMUNITY): Payer: Self-pay

## 2020-06-12 ENCOUNTER — Telehealth: Payer: Self-pay | Admitting: Gastroenterology

## 2020-06-12 DIAGNOSIS — M25642 Stiffness of left hand, not elsewhere classified: Secondary | ICD-10-CM | POA: Diagnosis not present

## 2020-06-12 NOTE — Telephone Encounter (Deleted)
Per Dr Bryan Lemma okay to try uceris with the same dosing

## 2020-06-12 NOTE — Telephone Encounter (Signed)
Inbound call from patient need prior authorization for  budeonide. [facility] is patient pharmacy. Patient contact number [phone]

## 2020-06-12 NOTE — Telephone Encounter (Signed)
Contacted the patient and advised her that her insurance denied the PA sent for budesoniede there are other medications they want her to try 1st

## 2020-06-13 ENCOUNTER — Encounter: Payer: Self-pay | Admitting: General Surgery

## 2020-06-13 NOTE — Telephone Encounter (Signed)
Per Elmyra Ricks with [facility]- ref [phone]- send medical records, allergies, dx for PA appeal.

## 2020-06-14 ENCOUNTER — Other Ambulatory Visit (HOSPITAL_COMMUNITY): Payer: Self-pay

## 2020-06-17 ENCOUNTER — Other Ambulatory Visit (HOSPITAL_COMMUNITY): Payer: Self-pay

## 2020-06-17 ENCOUNTER — Other Ambulatory Visit (HOSPITAL_BASED_OUTPATIENT_CLINIC_OR_DEPARTMENT_OTHER): Payer: Self-pay

## 2020-06-17 MED FILL — Omega-3-acid Ethyl Esters Cap 1 GM: ORAL | 45 days supply | Qty: 180 | Fill #0 | Status: AC

## 2020-06-17 MED FILL — Montelukast Sodium Tab 10 MG (Base Equiv): ORAL | 90 days supply | Qty: 90 | Fill #0 | Status: AC

## 2020-06-17 MED FILL — Esomeprazole Magnesium Cap Delayed Release 40 MG (Base Eq): ORAL | 90 days supply | Qty: 90 | Fill #0 | Status: AC

## 2020-06-18 ENCOUNTER — Other Ambulatory Visit (HOSPITAL_COMMUNITY): Payer: Self-pay

## 2020-06-19 ENCOUNTER — Other Ambulatory Visit (HOSPITAL_COMMUNITY): Payer: Self-pay

## 2020-06-19 DIAGNOSIS — M47816 Spondylosis without myelopathy or radiculopathy, lumbar region: Secondary | ICD-10-CM | POA: Diagnosis not present

## 2020-06-19 DIAGNOSIS — M150 Primary generalized (osteo)arthritis: Secondary | ICD-10-CM | POA: Diagnosis not present

## 2020-06-19 DIAGNOSIS — G894 Chronic pain syndrome: Secondary | ICD-10-CM | POA: Diagnosis not present

## 2020-06-19 DIAGNOSIS — Z79891 Long term (current) use of opiate analgesic: Secondary | ICD-10-CM | POA: Diagnosis not present

## 2020-06-19 MED ORDER — BELBUCA 600 MCG BU FILM
ORAL_FILM | BUCCAL | 2 refills | Status: DC
  Filled 2020-06-19 – 2020-07-05 (×2): qty 60, 30d supply, fill #0
  Filled 2020-08-12: qty 60, 30d supply, fill #1

## 2020-06-19 NOTE — Telephone Encounter (Signed)
Notified the patient and [facility] that insurance approved the prescription.

## 2020-06-19 NOTE — Telephone Encounter (Signed)
Patient calling to follow up on PA.

## 2020-06-20 ENCOUNTER — Other Ambulatory Visit (HOSPITAL_COMMUNITY): Payer: Self-pay

## 2020-06-21 ENCOUNTER — Other Ambulatory Visit (HOSPITAL_COMMUNITY): Payer: Self-pay

## 2020-06-21 MED ORDER — SULFAMETHOXAZOLE-TRIMETHOPRIM 800-160 MG PO TABS
1.0000 | ORAL_TABLET | Freq: Two times a day (BID) | ORAL | 0 refills | Status: DC
  Filled 2020-06-21: qty 60, 30d supply, fill #0

## 2020-07-03 DIAGNOSIS — G4733 Obstructive sleep apnea (adult) (pediatric): Secondary | ICD-10-CM | POA: Diagnosis not present

## 2020-07-03 DIAGNOSIS — J310 Chronic rhinitis: Secondary | ICD-10-CM | POA: Diagnosis not present

## 2020-07-03 DIAGNOSIS — J479 Bronchiectasis, uncomplicated: Secondary | ICD-10-CM | POA: Diagnosis not present

## 2020-07-04 ENCOUNTER — Encounter: Payer: Self-pay | Admitting: Dermatology

## 2020-07-04 ENCOUNTER — Other Ambulatory Visit: Payer: Self-pay

## 2020-07-04 ENCOUNTER — Ambulatory Visit (INDEPENDENT_AMBULATORY_CARE_PROVIDER_SITE_OTHER): Payer: Medicare PPO | Admitting: Dermatology

## 2020-07-04 DIAGNOSIS — L578 Other skin changes due to chronic exposure to nonionizing radiation: Secondary | ICD-10-CM | POA: Diagnosis not present

## 2020-07-04 DIAGNOSIS — D0439 Carcinoma in situ of skin of other parts of face: Secondary | ICD-10-CM

## 2020-07-04 DIAGNOSIS — D099 Carcinoma in situ, unspecified: Secondary | ICD-10-CM

## 2020-07-04 MED ORDER — KLISYRI 1 % EX OINT: 1.0000 "application " | TOPICAL_OINTMENT | Freq: Every evening | CUTANEOUS | 0 refills | Status: DC

## 2020-07-04 NOTE — Patient Instructions (Signed)

## 2020-07-05 ENCOUNTER — Other Ambulatory Visit (HOSPITAL_COMMUNITY): Payer: Self-pay

## 2020-07-08 ENCOUNTER — Other Ambulatory Visit (HOSPITAL_COMMUNITY): Payer: Self-pay

## 2020-07-09 ENCOUNTER — Other Ambulatory Visit: Payer: Self-pay

## 2020-07-09 ENCOUNTER — Ambulatory Visit: Payer: Medicare PPO | Admitting: Allergy and Immunology

## 2020-07-09 ENCOUNTER — Encounter: Payer: Self-pay | Admitting: Allergy and Immunology

## 2020-07-09 VITALS — BP 138/68 | HR 60 | Temp 97.3°F | Resp 16 | Ht 65.0 in | Wt 140.6 lb

## 2020-07-09 DIAGNOSIS — R5383 Other fatigue: Secondary | ICD-10-CM

## 2020-07-09 DIAGNOSIS — J479 Bronchiectasis, uncomplicated: Secondary | ICD-10-CM

## 2020-07-09 DIAGNOSIS — D839 Common variable immunodeficiency, unspecified: Secondary | ICD-10-CM

## 2020-07-09 DIAGNOSIS — A439 Nocardiosis, unspecified: Secondary | ICD-10-CM | POA: Diagnosis not present

## 2020-07-09 DIAGNOSIS — K52832 Lymphocytic colitis: Secondary | ICD-10-CM

## 2020-07-09 DIAGNOSIS — K219 Gastro-esophageal reflux disease without esophagitis: Secondary | ICD-10-CM

## 2020-07-09 NOTE — Patient Instructions (Addendum)
  1.  Continue immunoglobulin infusions  2.  Continue to treat reflex / LPR:   A. Nexium 40 mg in AM  B. Famotidine 40 mg in PM  3.  Treat and prevent inflammation of upper airway:   A.  Montelukast 10 mg -1 tablet 1 time per day  B.  OTC Nasacort -1 spray each nostril 1 time per day  4. Eliminate use of Zyrtec and chlorpheniramine.  Can use loratadine 10 mg 1 tablet 1 time per day if needed  5.  Obtain serum IgG level first week of August 2022  6.  Return to clinic in 12 weeks or earlier if problem

## 2020-07-09 NOTE — Progress Notes (Signed)
[facility] [place] [facility] [place]   Follow-up Note  Referring Provider: Isaac Bliss, [facility]* Primary Provider: Isaac Bliss, Rayford Halsted, MD Date of Office Visit: 07/09/2020  Subjective:   Beverly [name] (DOB: 04/20/1945) is a 75 y.o. female who returns to the [facility] on 07/09/2020 in re-evaluation of the following:  HPI: Beverly [name] returns to this clinic in evaluation of hypogammaglobulinemia /CVID, history of bronchiectasis, history of Nocardia pneumonia, history of reflux induced respiratory disease, and chronic diarrhea.  Her last visit to this clinic was 16 April 2020.  We switched her from Hizentra to [facility] and she has continued to do well with her immunoglobulin infusions.  She has not had any infections.  She has no significant respiratory tract symptoms other than some occasional phlegm that she can clear from her chest using a flutter valve.  She has 1 more month of Bactrim to utilize for her nocardia infection.  She believes that her reflux is under very good control at this point in time.  She still continues on Nexium and famotidine.  As suspected, she has lymphocytic colitis and is now on oral budesonide 9 mg/day.  She has had a dramatic response to this therapy and no longer has any diarrhea.  She is followed by GI for this issue.  She relates this low-grade fatigue where she just does not have any get up and go which has been a longstanding issue.  It should be noted that she is using Zyrtec and Chlor-Trimeton.  Allergies as of 07/09/2020      Reactions   Bactrim [sulfamethoxazole-trimethoprim] Diarrhea, Nausea And Vomiting   Severe nausea, vomiting, and diarrhea   Adhesive [tape] Rash   Codeine Rash      Medication List      acetaminophen 500 MG tablet Commonly known as: TYLENOL Take 1,000 mg by mouth every 6 (six) hours as needed for moderate pain.   albuterol 108 (90 Base) MCG/ACT inhaler Commonly known as:  VENTOLIN HFA Inhale 2 puffs into the lungs every 6 (six) hours as needed for wheezing or shortness of breath.   aspirin EC 81 MG tablet Take 81 mg by mouth daily.   atorvastatin 40 MG tablet Commonly known as: LIPITOR TAKE 1 TABLET BY MOUTH ONCE A DAY   Belbuca 600 MCG Film Generic drug: Buprenorphine HCl Place 1 film to inside of mouth and let dissolve every twelve hours   Calcium Carbonate-Vitamin D 600-200 MG-UNIT Tabs Take 1 tablet by mouth 2 (two) times daily.   celecoxib 200 MG capsule Commonly known as: CELEBREX TAKE 1 CAPSULE BY MOUTH 2 TIMES DAILY   colestipol 1 g tablet Commonly known as: COLESTID Take 1/2-1 tablet  3 times per day   diclofenac Sodium 1 % Gel Commonly known as: VOLTAREN Apply 2 g topically 4 (four) times daily as needed (joint pain).   diphenhydrAMINE 25 MG tablet Commonly known as: SOMINEX Geri-Dryl 25 mg tablet  TAKE TWO TABLETS BY MOUTH 30 minutes before infusion   diphenoxylate-atropine 2.5-0.025 MG tablet Commonly known as: Lomotil Take 1 tablet by mouth 4 (four) times daily as needed for diarrhea or loose stools.   esomeprazole 40 MG capsule Commonly known as: NEXIUM TAKE 1 CAPSULE BY MOUTH DAILY IN THE MORNING   famotidine 40 MG tablet Commonly known as: PEPCID TAKE 1 TABLET BY MOUTH AT BEDTIME.   Flutter Devi 1 puff by Does not apply route 2 (two) times daily.   gabapentin 800  MG tablet Commonly known as: NEURONTIN TAKE 1 TABLET BY MOUTH ONCE DAILY AT BEDTIME   GUAIFENESIN ER PO Take 400 mg by mouth 2 (two) times daily.   Hizentra 10 GM/50ML Soln Generic drug: Immune Globulin (Human)   HYDROcodone-acetaminophen 7.5-325 MG tablet Commonly known as: NORCO Take 1 tablet by mouth every 12 (twelve) hours as needed for moderate pain. Dr Ed Blalock 1 % Oint Generic drug: Tirbanibulin Apply 1 application topically at bedtime.   MELATIN PO   methocarbamol 500 MG tablet Commonly known as: ROBAXIN Take 500 mg by  mouth in the morning and at bedtime.   montelukast 10 MG tablet Commonly known as: SINGULAIR TAKE 1 TABLET BY MOUTH AT BEDTIME.   multivitamin with minerals tablet Take 1 tablet by mouth daily.   omega-3 acid ethyl esters 1 g capsule Commonly known as: LOVAZA TAKE 2 CAPSULES BY MOUTH 2 TIMES DAILY   ondansetron 4 MG tablet Commonly known as: ZOFRAN TAKE 1 TABLET BY MOUTH EVERY 8 HOURS AS NEEDED FOR NAUSEA AND/OR VOMITING   Ortikos 9 MG Cp24 Generic drug: Budesonide ER Take 1 capsule (9 mg) by mouth daily for 60 days, then decrease to 6 mg daily for 14 days, then decrease to 3 mg daily for 14 days. Start taking on: June 06, 2020   PARoxetine 30 MG tablet Commonly known as: PAXIL TAKE 2 TABLETS BY MOUTH ONCE A DAY   sodium chloride HYPERTONIC 3 % nebulizer solution Take by nebulization in the morning and at bedtime.   sulfamethoxazole-trimethoprim 800-160 MG tablet Commonly known as: BACTRIM DS Take 1 tablet by mouth 2 (two) times daily.   triamcinolone 55 MCG/ACT Aero nasal inhaler Commonly known as: NASACORT Place 2 sprays into the nose daily.   Xembify 10 GM/50ML Soln Generic drug: Immune Globulin (Human)-klhw Xembify 10 gram/50 mL (20 %) subcutaneous solution  INJECT 10G SUBCUTANEOUSLY EVERY WEEK       Past Medical History:  Diagnosis Date  . Allergy   . Amaurosis fugax    negative w/u through [facility]  . Asthma   . Back pain    Dr Joline Salt 02/2010-epidural injection x 2 at L4-5 with good effect  . 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 BCC (basal cell carcinoma of skin) 05/09/2014   mid lower back  . BCC (basal cell carcinoma of skin) 05/03/2017   right low back  . BCC (basal cell carcinoma of skin) 05/03/2017   left upper back  . BCC (basal cell carcinoma of skin) 07/05/2018   left mid back  . BCC (basal cell carcinoma of skin) 05/20/1992   upper back  . BCC (basal cell carcinoma of skin) 07/29/1993   left sholder medial  . BCC  (basal cell carcinoma of skin) 07/29/1993   left sholder lateral  . BCC (basal cell carcinoma of skin) 07/29/1993   right thigh  . BCC (basal cell carcinoma of skin) 07/29/1993   right sholder  . BCC (basal cell carcinoma of skin) 12/22/1994   right mid forearm  . BCC (basal cell carcinoma of skin) 12/22/1994   right upper forearm  . BCC (basal cell carcinoma of skin) 12/22/1994   lower right upper arm  . BCC (basal cell carcinoma of skin) 12/22/1994   right upper arm sholder  . BCC (basal cell carcinoma of skin) 08/11/1995   left leg below knee  . BCC (basal cell carcinoma of skin) 04/11/2002   mid back  . BCC (basal cell carcinoma  of skin) 12/10/2002   right center upper back  . BCC (basal cell carcinoma of skin) 05/26/2005   right post sholder  . BCC (basal cell carcinoma) 05/09/2014   left inner shin  . BCC (basal cell carcinoma) 06/12/2014   left forearm  . Bowen's disease 10/07/1994   right post knee, right inner forearm/wrist  . Bowen's disease 08/11/1995   left sholder  . Cataract    left  . Chronic pain   . Depression   . Diverticulosis of colon 1998   mild  . DJD (degenerative joint disease)   . Duodenal ulcer    h/o  . GERD (gastroesophageal reflux disease)   . History of SCC (squamous cell carcinoma) of skin    Dr. Denna Haggard  . History of sinus bradycardia   . HLD (hyperlipidemia)    hypertriglyceridemia  . Hypertensive retinopathy of both eyes 11/18/2017  . IBS (irritable bowel syndrome)   . Internal hemorrhoids 1998  . Mitral regurgitation    mild  . Osteoarthritis    feet,shoulder,neck,back,hips and hands.  . Pneumonia   . PONV (postoperative nausea and vomiting)   . Rotator cuff tear, right 02/2019   infraspinatus and supraspinatus, and dislocation of long head of bicep tendon (Dr. Alvan Dame)  . SCC (squamous cell carcinoma) 11/26/2014   left hand, right hand, right deltoid  . SCC (squamous cell carcinoma) 05/03/2017   left cheek  . Sleep apnea     uses a mouth guard nightly  . Squamous cell carcinoma in situ (SCCIS) 07/05/2018   left hand  . Vitamin D deficiency    mild    Past Surgical History:  Procedure Laterality Date  . ABDOMINAL HYSTERECTOMY    . BLEPHAROPLASTY Bilateral 01/2018  . BROW LIFT Bilateral 10/10/2018   Procedure: BROW LIFT;  Surgeon: Wallace Going, DO;  Location: [facility];  Service: Plastics;  Laterality: Bilateral;  . BUNIONECTOMY     R 12/08, L 2004 (Dr. Janus Molder)  . CARPAL TUNNEL RELEASE  1989   bilateral  . CATARACT EXTRACTION Bilateral Left in 08/2012, Right 04/2016   Dr.Hecker  . CESAREAN SECTION     x2  . CHOLECYSTECTOMY  1992  . CLOSED REDUCTION NASAL FRACTURE N/A 08/29/2018   Procedure: CLOSED REDUCTION NASAL FRACTURE;  Surgeon: Wallace Going, DO;  Location: [facility];  Service: Plastics;  Laterality: N/A;  1 hour, please  . COLONOSCOPY  2006  . COLONOSCOPY  01/2012   due again 01/2022; mild diverticulosis  . epidural steroid injection, back  02/2010  . HIP SURGERY     right bursectomy x 3  . HIP SURGERY Right 2000   torn cartilage, repaired  . INGUINAL HERNIA REPAIR  9/09   bilat  . NECK SURGERY  1989   c6-7 cervical laminectomy and diskecotmy  . NECK SURGERY  1989   per patient posterior area of neck  . REVERSE SHOULDER ARTHROPLASTY Right 04/11/2019   Procedure: REVERSE SHOULDER ARTHROPLASTY;  Surgeon: Nicholes Stairs, MD;  Location: [facility];  Service: Orthopedics;  Laterality: Right;  [facility]  . SHOULDER SURGERY  2/05   left rotator cuff repair  . SHOULDER SURGERY Left 03/14/2018   rotator cuff repair; Dr. Tonita Cong  . TONSILLECTOMY  age 85  . TOTAL ABDOMINAL HYSTERECTOMY W/ BILATERAL SALPINGOOPHORECTOMY  1997   fibroids  . TROCHANTERIC BURSA EXCISION Right 1978  . TUBAL LIGATION  1986  . WISDOM TOOTH EXTRACTION      Review of  systems negative except as noted in HPI / PMHx or noted below:  Review of Systems  Constitutional:  Negative.   HENT: Negative.   Eyes: Negative.   Respiratory: Negative.   Cardiovascular: Negative.   Gastrointestinal: Negative.   Genitourinary: Negative.   Musculoskeletal: Negative.   Skin: Negative.   Neurological: Negative.   Endo/Heme/Allergies: Negative.   Psychiatric/Behavioral: Negative.      Objective:   Vitals:   07/09/20 1546  BP: 138/68  Pulse: 60  Resp: 16  Temp: (!) 97.3 F (36.3 C)  SpO2: 96%   Height: 5\' 5"  (165.1 cm)  Weight: 140 lb 9.6 oz (63.8 kg)   Physical Exam Constitutional:      Appearance: She is not diaphoretic.  HENT:     Head: Normocephalic.     Right Ear: Tympanic membrane, ear canal and external ear normal.     Left Ear: Tympanic membrane, ear canal and external ear normal.     Nose: Nose normal. No mucosal edema or rhinorrhea.     Mouth/Throat:     Pharynx: Uvula midline. No oropharyngeal exudate.  Eyes:     Conjunctiva/sclera: Conjunctivae normal.  Neck:     Thyroid: No thyromegaly.     Trachea: Trachea normal. No tracheal tenderness or tracheal deviation.  Cardiovascular:     Rate and Rhythm: Normal rate and regular rhythm.     Heart sounds: Normal heart sounds, S1 normal and S2 normal. No murmur heard.   Pulmonary:     Effort: No respiratory distress.     Breath sounds: Normal breath sounds. No stridor. No wheezing or rales.  Lymphadenopathy:     Head:     Right side of head: No tonsillar adenopathy.     Left side of head: No tonsillar adenopathy.     Cervical: No cervical adenopathy.  Skin:    Findings: No erythema or rash.     Nails: There is no clubbing.  Neurological:     Mental Status: She is alert.     Diagnostics:    Spirometry was performed and demonstrated an FEV1 of 2.50 at 109 % of predicted.   Assessment and Plan:   1. CVID (common variable immunodeficiency) ([place])   2. Bronchiectasis without complication ([place])   3. Nocardia infection   4. LPRD (laryngopharyngeal reflux disease)   5. Lymphocytic  colitis   6. Other fatigue     1.  Continue immunoglobulin infusions  2.  Continue to treat reflex / LPR:   A. Nexium 40 mg in AM  B. Famotidine 40 mg in PM  3.  Treat and prevent inflammation of upper airway:   A.  Montelukast 10 mg -1 tablet 1 time per day  B.  OTC Nasacort -1 spray each nostril 1 time per day  4. Eliminate use of Zyrtec and chlorpheniramine.  Can use loratadine 10 mg 1 tablet 1 time per day if needed  5.  Obtain serum IgG level first week of August 2022  6.  Return to clinic in 12 weeks or earlier if problem  Overall Katherin appears to be doing relatively well.  We will check her IgG level in August 2022.  She will continue on therapy for respiratory tract inflammation and reflux.  We will eliminate her Zyrtec and chlorpheniramine given her low-grade fatigue.  I will see her back in this clinic in 3 months or earlier if there is a problem.  Allena Katz, MD Allergy / Immunology [facility]

## 2020-07-10 ENCOUNTER — Encounter: Payer: Self-pay | Admitting: Allergy and Immunology

## 2020-07-16 ENCOUNTER — Other Ambulatory Visit (HOSPITAL_COMMUNITY): Payer: Self-pay

## 2020-07-17 ENCOUNTER — Other Ambulatory Visit: Payer: Self-pay | Admitting: Gastroenterology

## 2020-07-17 ENCOUNTER — Other Ambulatory Visit (HOSPITAL_COMMUNITY): Payer: Self-pay

## 2020-07-18 ENCOUNTER — Other Ambulatory Visit (HOSPITAL_COMMUNITY): Payer: Self-pay

## 2020-07-18 ENCOUNTER — Telehealth: Payer: Self-pay | Admitting: Gastroenterology

## 2020-07-18 ENCOUNTER — Other Ambulatory Visit: Payer: Self-pay | Admitting: General Surgery

## 2020-07-18 ENCOUNTER — Other Ambulatory Visit (HOSPITAL_BASED_OUTPATIENT_CLINIC_OR_DEPARTMENT_OTHER): Payer: Self-pay

## 2020-07-18 MED ORDER — METHOCARBAMOL 500 MG PO TABS
500.0000 mg | ORAL_TABLET | Freq: Three times a day (TID) | ORAL | 0 refills | Status: DC
  Filled 2020-07-18: qty 180, 60d supply, fill #0
  Filled 2020-09-30: qty 180, 60d supply, fill #1

## 2020-07-18 MED ORDER — BUDESONIDE 3 MG PO CPEP
6.0000 mg | ORAL_CAPSULE | Freq: Every day | ORAL | 0 refills | Status: DC
  Filled 2020-07-18: qty 28, 14d supply, fill #0

## 2020-07-18 MED ORDER — BUDESONIDE 3 MG PO CPEP
3.0000 mg | ORAL_CAPSULE | Freq: Every day | ORAL | 0 refills | Status: DC
  Filled 2020-07-18 (×2): qty 14, 14d supply, fill #0

## 2020-07-18 NOTE — Progress Notes (Signed)
Spoke with Brayton Layman at [facility], fixed rx to show budesonide 3mg  capsule 2 daily for 14 days #28. Notified the patient

## 2020-07-18 NOTE — Telephone Encounter (Signed)
Inbound call from patient. States the Budesonide does not come in 6mg  tablets. She says in order to finish the treatment plan, there is not enough for her. She states can give a call back to discuss. Best contact number [phone]

## 2020-07-19 ENCOUNTER — Other Ambulatory Visit (HOSPITAL_COMMUNITY): Payer: Self-pay

## 2020-07-19 ENCOUNTER — Encounter: Payer: Self-pay | Admitting: Dermatology

## 2020-07-19 NOTE — Progress Notes (Signed)
   Follow-Up Visit   Subjective  Beverly [name] is a 75 y.o. female who presents for the following: Procedure (Patient here today to discuss BCC x 1 left lower leg anterior and have CIS x 1 left malar cheek treated.).  Carcinoma in situ left cheek plus discuss treatment options for multiple crusts on legs Location:  Duration:  Quality:  Associated Signs/Symptoms: Modifying Factors:  Severity:  Timing: Context:   Objective  Well appearing patient in no apparent distress; mood and affect are within normal limits. Objective  Right Lower Leg - Anterior: Her lower legs represent a mixture of UV damage sequelae.  There is a biopsy-proven infiltrative BCC on the left lower leg which may require Mohs surgery.  There are half dozen 1 cm pink waxy crusts on her shins which represent a mixture of superficial carcinoma in situ plus actinic keratoses; she will try Klisyri for 5 nights on 1 or 2 spots.  Images    Objective  Left Malar Cheek: Lesion identified by Dr.Tanny Harnack and nurse in room.      A focused examination was performed including Head, neck, legs.. Relevant physical exam findings are noted in the Assessment and Plan.   Assessment & Plan    Actinic skin damage Right Lower Leg - Anterior  Lot # [id number] Exp. 07/2021  Tirbanibulin (KLISYRI) 1 % OINT - Right Lower Leg - Anterior  Squamous cell carcinoma in situ Left Malar Cheek  Destruction of lesion Complexity: simple   Destruction method: electrodesiccation and curettage   Informed consent: discussed and consent obtained   Timeout:  patient name, date of birth, surgical site, and procedure verified Anesthesia: the lesion was anesthetized in a standard fashion   Anesthetic:  1% lidocaine w/ epinephrine 1-100,000 local infiltration Curettage performed in three different directions: Yes   Curettage cycles:  3 Lesion length (cm):  1.1 Lesion width (cm):  1.1 Margin per side (cm):  0 Final wound size (cm):   1.1 Hemostasis achieved with:  ferric subsulfate Outcome: patient tolerated procedure well with no complications   Post-procedure details: sterile dressing applied and wound care instructions given   Dressing type: bandage and petrolatum   Additional details:  Wound innoculated with 5 fluorouracil solution.      I, Lavonna Monarch, MD, have reviewed all documentation for this visit.  The documentation on 07/19/20 for the exam, diagnosis, procedures, and orders are all accurate and complete.

## 2020-07-25 ENCOUNTER — Telehealth: Payer: Self-pay | Admitting: Dermatology

## 2020-07-25 NOTE — Telephone Encounter (Signed)
Patient says that she was supposed to call and let someone know the result of using samples of Klisyri.  Patient says that she is not very happy with the results.  Patient states that she only had a good response on one lesion on her right forearm.  Patient states that she had no response on the right lower leg.  Patient wants to know if there is something else she can try.

## 2020-07-25 NOTE — Telephone Encounter (Signed)
Please advise 

## 2020-07-28 MED FILL — Celecoxib Cap 200 MG: ORAL | 90 days supply | Qty: 180 | Fill #0 | Status: AC

## 2020-07-29 ENCOUNTER — Other Ambulatory Visit (HOSPITAL_COMMUNITY): Payer: Self-pay

## 2020-07-31 ENCOUNTER — Other Ambulatory Visit (HOSPITAL_COMMUNITY): Payer: Self-pay

## 2020-07-31 ENCOUNTER — Other Ambulatory Visit: Payer: Self-pay | Admitting: Internal Medicine

## 2020-07-31 DIAGNOSIS — Z5181 Encounter for therapeutic drug level monitoring: Secondary | ICD-10-CM

## 2020-07-31 DIAGNOSIS — A439 Nocardiosis, unspecified: Secondary | ICD-10-CM

## 2020-07-31 DIAGNOSIS — K529 Noninfective gastroenteritis and colitis, unspecified: Secondary | ICD-10-CM

## 2020-07-31 MED FILL — Gabapentin Tab 800 MG: ORAL | 90 days supply | Qty: 90 | Fill #0 | Status: AC

## 2020-08-01 ENCOUNTER — Other Ambulatory Visit (HOSPITAL_COMMUNITY): Payer: Self-pay

## 2020-08-01 MED ORDER — OMEGA-3-ACID ETHYL ESTERS 1 G PO CAPS
ORAL_CAPSULE | ORAL | 1 refills | Status: DC
  Filled 2020-08-01: qty 180, 45d supply, fill #0
  Filled 2020-09-08: qty 180, 45d supply, fill #1

## 2020-08-02 ENCOUNTER — Other Ambulatory Visit: Payer: Self-pay

## 2020-08-02 ENCOUNTER — Other Ambulatory Visit (HOSPITAL_COMMUNITY): Payer: Self-pay

## 2020-08-02 ENCOUNTER — Other Ambulatory Visit: Payer: Self-pay | Admitting: Infectious Diseases

## 2020-08-02 MED ORDER — SULFAMETHOXAZOLE-TRIMETHOPRIM 800-160 MG PO TABS
1.0000 | ORAL_TABLET | Freq: Two times a day (BID) | ORAL | 2 refills | Status: DC
  Filled 2020-08-02: qty 60, 30d supply, fill #0

## 2020-08-02 MED ORDER — SULFAMETHOXAZOLE-TRIMETHOPRIM 800-160 MG PO TABS
1.0000 | ORAL_TABLET | Freq: Two times a day (BID) | ORAL | 0 refills | Status: DC
  Filled 2020-08-02 (×2): qty 60, 30d supply, fill #0

## 2020-08-02 NOTE — Telephone Encounter (Signed)
Messaged forwarded to the patient

## 2020-08-03 DIAGNOSIS — G4733 Obstructive sleep apnea (adult) (pediatric): Secondary | ICD-10-CM | POA: Diagnosis not present

## 2020-08-03 DIAGNOSIS — J310 Chronic rhinitis: Secondary | ICD-10-CM | POA: Diagnosis not present

## 2020-08-03 DIAGNOSIS — J479 Bronchiectasis, uncomplicated: Secondary | ICD-10-CM | POA: Diagnosis not present

## 2020-08-05 ENCOUNTER — Other Ambulatory Visit: Payer: Medicare PPO

## 2020-08-05 ENCOUNTER — Other Ambulatory Visit: Payer: Self-pay

## 2020-08-05 DIAGNOSIS — Z5181 Encounter for therapeutic drug level monitoring: Secondary | ICD-10-CM

## 2020-08-05 DIAGNOSIS — A439 Nocardiosis, unspecified: Secondary | ICD-10-CM | POA: Diagnosis not present

## 2020-08-05 DIAGNOSIS — K529 Noninfective gastroenteritis and colitis, unspecified: Secondary | ICD-10-CM

## 2020-08-05 NOTE — Addendum Note (Signed)
Addended by: Lucie Leather D on: 08/05/2020 11:33 AM   Modules accepted: Orders

## 2020-08-06 ENCOUNTER — Other Ambulatory Visit (HOSPITAL_COMMUNITY): Payer: Self-pay

## 2020-08-06 ENCOUNTER — Encounter: Payer: Self-pay | Admitting: Infectious Diseases

## 2020-08-06 ENCOUNTER — Ambulatory Visit: Payer: Medicare PPO | Admitting: Infectious Diseases

## 2020-08-06 ENCOUNTER — Other Ambulatory Visit: Payer: Self-pay | Admitting: Gastroenterology

## 2020-08-06 ENCOUNTER — Other Ambulatory Visit: Payer: Self-pay

## 2020-08-06 VITALS — BP 132/72 | HR 54 | Temp 97.9°F | Wt 143.2 lb

## 2020-08-06 DIAGNOSIS — Z5181 Encounter for therapeutic drug level monitoring: Secondary | ICD-10-CM

## 2020-08-06 DIAGNOSIS — D801 Nonfamilial hypogammaglobulinemia: Secondary | ICD-10-CM | POA: Diagnosis not present

## 2020-08-06 DIAGNOSIS — A439 Nocardiosis, unspecified: Secondary | ICD-10-CM

## 2020-08-06 LAB — BASIC METABOLIC PANEL
BUN/Creatinine Ratio: 36 (calc) — ABNORMAL HIGH (ref 6–22)
BUN: 21 mg/dL (ref 7–25)
CO2: 32 mmol/L (ref 20–32)
Calcium: 8.8 mg/dL (ref 8.6–10.4)
Chloride: 106 mmol/L (ref 98–110)
Creat: 0.59 mg/dL — ABNORMAL LOW (ref 0.60–0.93)
Glucose, Bld: 58 mg/dL — ABNORMAL LOW (ref 65–99)
Potassium: 4.3 mmol/L (ref 3.5–5.3)
Sodium: 142 mmol/L (ref 135–146)

## 2020-08-06 MED ORDER — SULFAMETHOXAZOLE-TRIMETHOPRIM 800-160 MG PO TABS
1.0000 | ORAL_TABLET | Freq: Two times a day (BID) | ORAL | 2 refills | Status: DC
  Filled 2020-08-06 – 2020-09-01 (×2): qty 60, 30d supply, fill #0

## 2020-08-06 NOTE — Progress Notes (Signed)
[facility]                                                             [place], [place], [place], [address]                                                                  Phn. [phone]; Fax: [phone]                                                                             Date: 08/06/20  Reason for Referral: Follow up for Nocardia pneumonia and diarrhea   Assessment/Plan Problem List Items Addressed This Visit       Other   Nocardia infection - Primary   Relevant Medications   sulfamethoxazole-trimethoprim (BACTRIM DS) 800-160 MG tablet   Medication monitoring encounter   Hypogammaglobulinemia ([place])    Pulmonary nocardia infection Hypogammaglobulinemia Lymphocytic colitis: On budesonide, tapering dose Medication monitoring- BMP WNL   Recommendations Start Bactrim 1 double strength tablet twice daily Follow-up in 1 month, BMP at that time  Follow up for management of GERD/Asthma/LPR and Hypogammaglobulinemia per Pulmonary and Immunology    All questions and concerns were discussed and addressed. Patient verbalized understanding of the plan. ____________________________________________________________________________________________________________________ Subjective/Interval events 02/20/20 She says she has not had as much mucous plugs as she had before and it is more stringy stuff that comes out. Cough is usually  in the morning and more when she  moves around. She feels the bactrim has helped to get the phlegm out. However, she has not noticed any significant improvement in terms of SOB.    She says she was able to talk with her daughter around 45 minutes before sept 2020 but since that time she feels she gets SOB even when talking for 5-10 minutes with her daughter. She feels the SOB has stayed the same since September 2020.  Denies any fever/chills but she says  she sweats always. Appetite is good. No changes in weight. No N/V/D No urinary symptoms.   She feels tired but says the fatigue has gotten better as she does not need to sleep in the day as much as she used to before and takes a brief nap.    She is following up with Pulm and is also getting weekly IV immunoglobulins from her immunologist.   03/12/20 She has been having loose stool since January 8, which initially started as 3-4 episodes a day and was watery. However, it continued to get worse and started having 5-6 BMs a day. At one point ( Jan 20th), she stopped taking all her meds including bactrim and she felt her diarrhea got somewhat better. She stopped it for 3 days and started taking PO Vancomycin around 1/18 which  she has been taking until now. However, she felt the diarrhea came back right after she started taking all her meds including bactrim.    Denies any fevers, chills, sweats Denies nausea/vomiting.abdominal cramps/GU symptoms Denies recent travel, sick contact, outside food, family member having diarrhea, no new medications    In terms of cough, she feels its getting better, she has less mucous plugs. SOB has not completely gone away, but thinks there is some improvement. She does not seem to have SOB while talking to some one but if she talks for a longer time she feels SOB. She thinks she has lost  7 lbs since September 2021.     03/27/20 Karene Fry Fine is a 75 year old female with a history of mild persistent asthma, GERD, Bronchiectasis, OSA, Tracheomalacia, Primary immunodeficiency on immunoglobulin therapy, Rt reverse shoulder arthroplasty, skin malignancy here for follow up of Nocardia PNA and diarrhea. She has completed approx 5 months of bactrim therapy for Nocardia PNA. Bactrim was stopped last visit due to diarrhea that was thought to be related to bactrim. C diff was negative.    She says her diarrhea got better since the bactrim was stopped and was doing well until  this past Monday when she had few episodes of loose stool and started having solid stool from Tuesday. She says she is also going through some family issues and unsure if the diarrhea is related to it as it only lasted for 1 day. She also says that she saw some blood in the stool. She says last colonoscopy was 7 years ago. Denies any fevers, chills, sweats, abdominal cramps. Denies nausea, vomiting. Appetite is good. No new medicine or new food intake.    In terms of respiratory standpoint, she says cough is better 2-3 times a day. Phlegm is also less. SOB has been stable. She get SOB when talking for a long period of time.    05/29/20 Here for follow up of Nocardia pneumonia. She restarted bactrim on 3/10 after it was noticed that there was no improvement in her diarrhea after being off of bactrim. She has not noticed any changes in diarrhea since it started. Says every time she eats, she has 1-2 episodes of diarrhea if she does not take Imodium. She is taking 2 Imodium three times a day. She recently went to [place] for vacation on march 30 and came back a week ago. She was taken off of IVIG by Dr Neldon Mc and says she did not notice any improvement in the diarrhea. Stool is watery, foul smelling but no blood. Denies fevers, chils, nausea, vomiting and abdominal pain. She was also seen by GI and had colonoscopy done 4/8 results are pending. She says she has lost weight due to diarrhea.   In terms of respiratory symptoms, she is coughing less than before, produces mucous plugs which are less thick and less viscous. Phlegm is yellow in color Breathing has been the same. She mostly gets SOB when she talks but says she was able to walk without any difficulty when she recently went to [place]. She is able to do 30-45 minutes yard work at home. She is willing to trial Lomotil to help with her diarrhea. I discussed side effects and need to be cautious with its use and preferably take it before sleep.    08/06/20 Here for follow-up of pulmonary nocardia infection.  She took her last dose of Bactrim this past Sunday and will be starting to take again from today.  She feels a lot better than when seen in the clinic last visit.  Her diarrhea which was previously thought to be related to Bactrim was found out to be secondary to lymphocytic colitis and she has been on steroids on a tapering dose.  Diarrhea has completely resolved.  She is also getting weekly immunoglobulin infusions with allergy/immunology.  Cough has significantly improved and she only coughs 5-6 times a day.  Phlegm has also improved and she has minimal greenish phlegm if any.  Shortness of breath has been the same and she states she gets short of breath when talking for 5 to 10 minutes.  She overall feels well.  I discussed with her about continuing Bactrim and plan to treat for longer duration given her hypogammaglobulinemia/being on steroids now.  She agrees to the plan.  I will follow-up in 1 month.   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   right sholder   BCC (basal cell carcinoma of skin) 05/09/2014   mid lower back   BCC (basal cell carcinoma of skin) 05/03/2017   right low back   BCC (basal cell carcinoma of skin) 05/03/2017   left upper back   BCC (basal cell carcinoma of skin) 07/05/2018   left mid back   BCC (basal cell carcinoma of skin) 05/20/1992   upper back   BCC (basal cell carcinoma of skin) 07/29/1993   left sholder medial   BCC (basal cell carcinoma of skin) 07/29/1993   left sholder lateral   BCC (basal cell carcinoma of skin) 07/29/1993   right thigh   BCC (basal cell carcinoma of skin) 07/29/1993   right sholder   BCC (basal cell  carcinoma of skin) 12/22/1994   right mid forearm   BCC (basal cell carcinoma of skin) 12/22/1994   right upper forearm   BCC (basal cell carcinoma of skin) 12/22/1994   lower right upper arm   BCC (basal cell carcinoma of skin) 12/22/1994   right upper arm sholder   BCC (basal cell carcinoma of skin) 08/11/1995   left leg below knee   BCC (basal cell carcinoma of skin) 04/11/2002   mid back   BCC (basal cell carcinoma of skin) 12/10/2002   right center upper back   BCC (basal cell carcinoma of skin) 05/26/2005   right post sholder   BCC (basal cell carcinoma) 05/09/2014   left inner shin   BCC (basal cell carcinoma) 06/12/2014   left forearm   Bowen's disease 10/07/1994   right post knee, right inner forearm/wrist   Bowen's disease 08/11/1995   left sholder   Cataract    left   Chronic pain    Depression    Diverticulosis of colon 1998   mild   DJD (degenerative joint disease)    Duodenal ulcer    h/o   GERD (gastroesophageal reflux disease)    History of SCC (squamous cell carcinoma) of skin    Dr. Denna Haggard   History of sinus bradycardia    HLD (hyperlipidemia)    hypertriglyceridemia   Hypertensive retinopathy of both eyes 11/18/2017   IBS (irritable bowel syndrome)    Internal hemorrhoids 1998   Mitral regurgitation  mild   Osteoarthritis    feet,shoulder,neck,back,hips and hands.   Pneumonia    PONV (postoperative nausea and vomiting)    Rotator cuff tear, right 02/2019   infraspinatus and supraspinatus, and dislocation of long head of bicep tendon (Dr. Alvan Dame)   SCC (squamous cell carcinoma) 11/26/2014   left hand, right hand, right deltoid   SCC (squamous cell carcinoma) 05/03/2017   left cheek   Sleep apnea    uses a mouth guard nightly   Squamous cell carcinoma in situ (SCCIS) 07/05/2018   left hand   Vitamin D deficiency    mild   Past Surgical History:  Procedure Laterality Date   ABDOMINAL HYSTERECTOMY     BLEPHAROPLASTY Bilateral 01/2018    BROW LIFT Bilateral 10/10/2018   Procedure: BROW LIFT;  Surgeon: Wallace Going, DO;  Location: [facility] [facility];  Service: Plastics;  Laterality: Bilateral;   BUNIONECTOMY     R 12/08, L 2004 (Dr. Janus Molder)   [place]   bilateral   CATARACT EXTRACTION Bilateral Left in 08/2012, Right 04/2016   Dr.Hecker   CESAREAN SECTION     x2   CHOLECYSTECTOMY  1992   CLOSED REDUCTION NASAL FRACTURE N/A 08/29/2018   Procedure: CLOSED REDUCTION NASAL FRACTURE;  Surgeon: Wallace Going, DO;  Location: [facility];  Service: Plastics;  Laterality: N/A;  1 hour, please   COLONOSCOPY  2006   COLONOSCOPY  01/2012   due again 01/2022; mild diverticulosis   epidural steroid injection, back  02/2010   HIP SURGERY     right bursectomy x 3   HIP SURGERY Right 2000   torn cartilage, repaired   INGUINAL HERNIA REPAIR  9/09   bilat   NECK SURGERY  1989   c6-7 cervical laminectomy and diskecotmy   NECK SURGERY  1989   per patient posterior area of neck   REVERSE SHOULDER ARTHROPLASTY Right 04/11/2019   Procedure: REVERSE SHOULDER ARTHROPLASTY;  Surgeon: Nicholes Stairs, MD;  Location: [facility];  Service: Orthopedics;  Laterality: Right;  Regional Block   SHOULDER SURGERY  2/05   left rotator cuff repair   SHOULDER SURGERY Left 03/14/2018   rotator cuff repair; Dr. Tonita Cong   TONSILLECTOMY  age 49   TOTAL ABDOMINAL HYSTERECTOMY W/ BILATERAL SALPINGOOPHORECTOMY  1997   fibroids   TROCHANTERIC BURSA EXCISION Right [place] EXTRACTION     Current Outpatient Medications on File Prior to Visit  Medication Sig Dispense Refill   sulfamethoxazole-trimethoprim (BACTRIM DS) 800-160 MG tablet Take 1 tablet by mouth 2 (two) times daily. 60 tablet 0   acetaminophen (TYLENOL) 500 MG tablet Take 1,000 mg by mouth every 6 (six) hours as needed for moderate pain.      albuterol (PROVENTIL HFA;VENTOLIN HFA) 108 (90 Base) MCG/ACT inhaler  Inhale 2 puffs into the lungs every 6 (six) hours as needed for wheezing or shortness of breath. 18 g 1   aspirin EC 81 MG tablet Take 81 mg by mouth daily.     atorvastatin (LIPITOR) 40 MG tablet TAKE 1 TABLET BY MOUTH ONCE A DAY 90 tablet 1   Buprenorphine HCl (BELBUCA) 600 MCG FILM Place 1 film to inside of mouth and let dissolve every twelve hours 60 each 2   Calcium Carbonate-Vitamin D 600-200 MG-UNIT TABS Take 1 tablet by mouth 2 (two) times daily.     celecoxib (CELEBREX) 200 MG capsule TAKE 1 CAPSULE  BY MOUTH 2 TIMES DAILY 180 capsule 1   colestipol (COLESTID) 1 g tablet Take 1/2-1 tablet  3 times per day 90 tablet 3   diclofenac Sodium (VOLTAREN) 1 % GEL Apply 2 g topically 4 (four) times daily as needed (joint pain).     diphenhydrAMINE (SOMINEX) 25 MG tablet Geri-Dryl 25 mg tablet  TAKE TWO TABLETS BY MOUTH 30 minutes before infusion     diphenoxylate-atropine (LOMOTIL) 2.5-0.025 MG tablet Take 1 tablet by mouth 4 (four) times daily as needed for diarrhea or loose stools. 30 tablet 0   esomeprazole (NEXIUM) 40 MG capsule TAKE 1 CAPSULE BY MOUTH DAILY IN THE MORNING 90 capsule 1   famotidine (PEPCID) 40 MG tablet TAKE 1 TABLET BY MOUTH AT BEDTIME. 90 tablet 1   gabapentin (NEURONTIN) 800 MG tablet TAKE 1 TABLET BY MOUTH ONCE DAILY AT BEDTIME 90 tablet 2   GUAIFENESIN ER PO Take 400 mg by mouth 2 (two) times daily.     HIZENTRA 10 GM/50ML SOLN      HYDROcodone-acetaminophen (NORCO) 7.5-325 MG tablet Take 1 tablet by mouth every 12 (twelve) hours as needed for moderate pain. Dr Hardin Negus     Immune Globulin, Human,-klhw (XEMBIFY) 10 GM/50ML SOLN Xembify 10 gram/50 mL (20 %) subcutaneous solution  INJECT 10G SUBCUTANEOUSLY EVERY WEEK     Melatonin-Pyridoxine (MELATIN PO)      methocarbamol (ROBAXIN) 500 MG tablet Take 500 mg by mouth in the morning and at bedtime.      methocarbamol (ROBAXIN) 500 MG tablet Take 1 tablet (500 mg total) by mouth 3 (three) times daily. 360 tablet 0    montelukast (SINGULAIR) 10 MG tablet TAKE 1 TABLET BY MOUTH AT BEDTIME. 90 tablet 1   Multiple Vitamins-Minerals (MULTIVITAMIN WITH MINERALS) tablet Take 1 tablet by mouth daily.     omega-3 acid ethyl esters (LOVAZA) 1 g capsule TAKE 2 CAPSULES BY MOUTH 2 TIMES DAILY 180 capsule 1   ondansetron (ZOFRAN) 4 MG tablet TAKE 1 TABLET BY MOUTH EVERY 8 HOURS AS NEEDED FOR NAUSEA AND/OR VOMITING 60 tablet 2   PARoxetine (PAXIL) 30 MG tablet TAKE 2 TABLETS BY MOUTH ONCE A DAY 180 tablet 1   Respiratory Therapy Supplies (FLUTTER) DEVI 1 puff by Does not apply route 2 (two) times daily. 1 each 0   sodium chloride HYPERTONIC 3 % nebulizer solution Take by nebulization in the morning and at bedtime. 750 mL 12   Tirbanibulin (KLISYRI) 1 % OINT Apply 1 application topically at bedtime. 2 each 0   triamcinolone (NASACORT) 55 MCG/ACT AERO nasal inhaler Place 2 sprays into the nose daily.     [DISCONTINUED] potassium chloride (KLOR-CON) 10 MEQ tablet Take 2 tablets (total 20 MEQ) by mouth once 2 tablet 0   No current facility-administered medications on file prior to visit.     Allergies  Allergen Reactions   Bactrim [Sulfamethoxazole-Trimethoprim] Diarrhea and Nausea And Vomiting    Severe nausea, vomiting, and diarrhea   Adhesive [Tape] Rash   Codeine Rash   Social History   Socioeconomic History   Marital status: Divorced    Spouse name: Not on file   Number of children: 2   Years of education: Not on file   Highest education level: Not on file  Occupational History   Occupation: STAFF NURSE    Employer: [employer]  Tobacco Use   Smoking status: Never   Smokeless tobacco: Never  Vaping Use   Vaping Use: Never used  Substance  and Sexual Activity   Alcohol use: Yes    Alcohol/week: 7.0 standard drinks    Types: 7 Glasses of wine per week    Comment: 1 glass of wine daily   Drug use: No   Sexual activity: Not Currently  Other Topics Concern   Not on file  Social History  Narrative   Divorced, lives alone, [place]; 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   [place] for [employer] part-time   Social Determinants of Health   Financial Resource Strain: Not on file  Food Insecurity: Not on file  Transportation Needs: Not on file  Physical Activity: Not on file  Stress: Not on file  Social Connections: Not on file  Intimate Partner Violence: Not on file    Vitals Wt 143 lb 3.2 oz (65 kg)   BMI 23.83 kg/m    Examination  General - not in acute distress, comfortably sitting in chair HEENT - PEERLA, no pallor and no icterus Chest - b/l clear air entry, no additional sounds CVS- Normal s1s2, RRR Abdomen - Soft, Non tender , non distended Ext- no pedal edema Neuro: grossly normal Back - WNL Psych : calm and cooperative   Recent labs CBC Latest Ref Rng & Units 05/29/2020 05/06/2020 03/28/2020  WBC 3.8 - 10.8 Thousand/uL 4.6 5.2 4.3  Hemoglobin 11.7 - 15.5 g/dL 10.6(L) 11.2(L) 11.8  Hematocrit 35.0 - 45.0 % 33.6(L) 33.1(L) 35.9  Platelets 140 - 400 Thousand/uL 171 135(L) 123(L)   CMP Latest Ref Rng & Units 08/05/2020 05/29/2020 05/06/2020  Glucose 65 - 99 mg/dL 58(L) 80 65  BUN 7 - 25 mg/dL 21 10 14   Creatinine 0.60 - 0.93 mg/dL 0.59(L) 0.71 0.80  Sodium 135 - 146 mmol/L 142 141 140  Potassium 3.5 - 5.3 mmol/L 4.3 4.0 4.8  Chloride 98 - 110 mmol/L 106 107 106  CO2 20 - 32 mmol/L 32 26 29  Calcium 8.6 - 10.4 mg/dL 8.8 8.5(L) 8.8  Total Protein 6.1 - 8.1 g/dL - 5.5(L) -  Total Bilirubin 0.2 - 1.2 mg/dL - 0.3 -  Alkaline Phos 39 - 117 IU/L - - -  AST 10 - 35 U/L - 20 -  ALT 6 - 29 U/L - 13 -    Pertinent Microbiology Results for orders placed or performed in visit on 04/01/20  Ova and parasite examination     Status: None   Collection Time: 04/01/20 11:54 AM   Specimen: Stool  Result Value Ref Range Status   MICRO  NUMBER: [id number]  Final   SPECIMEN QUALITY: Adequate  Final   Source STOOL  Final   STATUS: FINAL  Final   CONCENTRATE RESULT: No ova or parasites seen  Final   TRICHROME RESULT: No ova or parasites seen  Final   COMMENT:   Final    Routine Ova and Parasite exam may not detect some parasites that occasionally cause diarrheal illness. Cryptosporidium Antigen and/or Cyclospora and Isospora Exam may be ordered to detect these parasites. One negative sample does not necessarily rule out  the presence of a parasitic infection.  For additional information, please refer to https://education.[facility].com/faq/FAQ203 (This link is being provided for informational/ educational purposes only.)   Fecal leukocytes     Status: None   Collection Time: 04/01/20 11:54 AM   Specimen: Stool  Result Value Ref Range Status   Micro Number [id number]  Final  Specimen Quality Adequate  Final   Source STOOL  Final   Status FINAL  Final   Result Not Detected  Final    Comment: Reference Range:Not Detected   Pertinent Imaging All pertinent labs/Imagings/notes reviewed. All pertinent plain films and CT images have been personally visualized and interpreted; radiology reports have been reviewed. Decision making incorporated into the Impression / Recommendations.  I have spent 35 minutes for this patient encounter including  review of prior medical records with greater than 50% of time in face to face counsel of the patient/discussing diagnostics and plan of care.   Electronically signed by:  Rosiland Oz, MD Infectious Disease Physician [facility] [address] [place], [place] [address] Phone: [phone]  Fax: [phone]

## 2020-08-07 ENCOUNTER — Other Ambulatory Visit (HOSPITAL_COMMUNITY): Payer: Self-pay

## 2020-08-07 MED ORDER — BUDESONIDE 3 MG PO CPEP
3.0000 mg | ORAL_CAPSULE | Freq: Every day | ORAL | 0 refills | Status: DC
  Filled 2020-08-07: qty 14, 14d supply, fill #0

## 2020-08-07 NOTE — Telephone Encounter (Signed)
Left a voicemail for patient to contact the office regarding her budesonide. According to our notes the patient is to have completed the medication at this point. Her pharmacy is requesting a refill. When the patient calls back we need to check the status of her symptoms. Rx sent to [facility] for last taper of budesonide 3mg  daily for 14 days.

## 2020-08-09 DIAGNOSIS — M25551 Pain in right hip: Secondary | ICD-10-CM | POA: Diagnosis not present

## 2020-08-09 DIAGNOSIS — M545 Low back pain, unspecified: Secondary | ICD-10-CM | POA: Diagnosis not present

## 2020-08-12 ENCOUNTER — Other Ambulatory Visit (HOSPITAL_COMMUNITY): Payer: Self-pay

## 2020-08-13 ENCOUNTER — Other Ambulatory Visit (HOSPITAL_COMMUNITY): Payer: Self-pay

## 2020-08-14 ENCOUNTER — Other Ambulatory Visit (HOSPITAL_COMMUNITY): Payer: Self-pay

## 2020-08-14 DIAGNOSIS — Z79891 Long term (current) use of opiate analgesic: Secondary | ICD-10-CM | POA: Diagnosis not present

## 2020-08-14 DIAGNOSIS — M150 Primary generalized (osteo)arthritis: Secondary | ICD-10-CM | POA: Diagnosis not present

## 2020-08-14 DIAGNOSIS — M47816 Spondylosis without myelopathy or radiculopathy, lumbar region: Secondary | ICD-10-CM | POA: Diagnosis not present

## 2020-08-14 DIAGNOSIS — M25551 Pain in right hip: Secondary | ICD-10-CM | POA: Diagnosis not present

## 2020-08-14 DIAGNOSIS — G894 Chronic pain syndrome: Secondary | ICD-10-CM | POA: Diagnosis not present

## 2020-08-14 MED ORDER — BELBUCA 450 MCG BU FILM
ORAL_FILM | BUCCAL | 2 refills | Status: DC
  Filled 2020-08-14: qty 60, 30d supply, fill #0
  Filled 2020-09-22: qty 60, 30d supply, fill #1
  Filled 2020-10-24: qty 60, 30d supply, fill #2

## 2020-08-15 ENCOUNTER — Other Ambulatory Visit (HOSPITAL_COMMUNITY): Payer: Self-pay

## 2020-08-15 DIAGNOSIS — C44719 Basal cell carcinoma of skin of left lower limb, including hip: Secondary | ICD-10-CM | POA: Diagnosis not present

## 2020-08-16 ENCOUNTER — Ambulatory Visit: Payer: Medicare PPO | Admitting: Nurse Practitioner

## 2020-08-18 ENCOUNTER — Other Ambulatory Visit: Payer: Self-pay | Admitting: Gastroenterology

## 2020-08-18 ENCOUNTER — Other Ambulatory Visit: Payer: Self-pay | Admitting: Internal Medicine

## 2020-08-18 DIAGNOSIS — E782 Mixed hyperlipidemia: Secondary | ICD-10-CM

## 2020-08-19 ENCOUNTER — Other Ambulatory Visit: Payer: Self-pay | Admitting: Gastroenterology

## 2020-08-19 ENCOUNTER — Other Ambulatory Visit (HOSPITAL_COMMUNITY): Payer: Self-pay

## 2020-08-20 ENCOUNTER — Other Ambulatory Visit (HOSPITAL_COMMUNITY): Payer: Self-pay

## 2020-08-20 MED ORDER — ATORVASTATIN CALCIUM 40 MG PO TABS
ORAL_TABLET | Freq: Every day | ORAL | 1 refills | Status: DC
  Filled 2020-08-20: qty 90, 90d supply, fill #0
  Filled 2020-10-28: qty 90, 90d supply, fill #1

## 2020-08-20 MED ORDER — BUDESONIDE 3 MG PO CPEP
6.0000 mg | ORAL_CAPSULE | Freq: Every day | ORAL | 0 refills | Status: DC
  Filled 2020-08-20: qty 42, 28d supply, fill #0

## 2020-08-22 DIAGNOSIS — M545 Low back pain, unspecified: Secondary | ICD-10-CM | POA: Diagnosis not present

## 2020-08-23 ENCOUNTER — Other Ambulatory Visit: Payer: Self-pay

## 2020-08-23 DIAGNOSIS — A439 Nocardiosis, unspecified: Secondary | ICD-10-CM

## 2020-08-25 MED FILL — Famotidine Tab 40 MG: ORAL | 90 days supply | Qty: 90 | Fill #1 | Status: AC

## 2020-08-26 ENCOUNTER — Other Ambulatory Visit (HOSPITAL_COMMUNITY): Payer: Self-pay

## 2020-08-26 DIAGNOSIS — M545 Low back pain, unspecified: Secondary | ICD-10-CM | POA: Diagnosis not present

## 2020-08-27 ENCOUNTER — Other Ambulatory Visit (HOSPITAL_COMMUNITY): Payer: Self-pay

## 2020-08-27 ENCOUNTER — Other Ambulatory Visit: Payer: Self-pay

## 2020-08-27 ENCOUNTER — Ambulatory Visit: Payer: Medicare PPO | Admitting: Internal Medicine

## 2020-08-27 ENCOUNTER — Other Ambulatory Visit: Payer: Medicare PPO

## 2020-08-27 ENCOUNTER — Encounter: Payer: Self-pay | Admitting: Internal Medicine

## 2020-08-27 VITALS — BP 128/62 | HR 68 | Temp 99.0°F | Resp 18 | Ht 64.0 in | Wt 146.4 lb

## 2020-08-27 DIAGNOSIS — A439 Nocardiosis, unspecified: Secondary | ICD-10-CM | POA: Diagnosis not present

## 2020-08-27 DIAGNOSIS — J310 Chronic rhinitis: Secondary | ICD-10-CM

## 2020-08-27 MED ORDER — IPRATROPIUM BROMIDE 0.03 % NA SOLN
1.0000 | Freq: Three times a day (TID) | NASAL | 12 refills | Status: DC
Start: 2020-08-27 — End: 2021-02-11
  Filled 2020-08-27: qty 30, 27d supply, fill #0
  Filled 2020-11-17: qty 30, 27d supply, fill #1
  Filled 2021-02-03: qty 30, 27d supply, fill #2

## 2020-08-27 NOTE — Patient Instructions (Addendum)
Please schedule follow up scheduled with myself in 6 months.  If my schedule is not open yet, we will contact you with a reminder closer to that time.  Come and see me sooner if your breathing or coughing worsens.   Can try atrovent nasal spray at least twice a day and take it 30 minutes after your other spray. Try it for a month, can stop if not helping after a month.

## 2020-08-27 NOTE — Progress Notes (Signed)
Beverly [name]    [id number]    1945-04-29  Primary Care Physician:Hernandez Everardo Beals, MD Date of Appointment: 08/27/2020 Established Patient Visit  Chief complaint:   Chief Complaint  Patient presents with   Follow-up    No complaints      HPI: Beverly [name] is a 75 y.o. woman with bronchiectasis a hypogammaglobulinemia.  Interval Updates: Here for follow up. Diagnosed with lymphocytic colitis and is on budesonide for her diarrhea - much improved.  Still doing IVIG infusions.   Still has occasional dyspnea but cough much improved. Dramatic improvement after IVIG infusions which are still weekly.   Doing well off airway clearance and inhaler therapy.  Still on nocardia for bactrim for total 12 month therapy.   Most recent sputum cultures negative for AFB/nocardia.   She got her second covid booster. Had recent skin cancer resection on her left leg.   Still has chronic rhinitis and is on nasal saline rinses nasacort and singulair. Stopped anti-histamines.   I have reviewed the patient's past medical, family, and social history and made changes as appropriate.    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   right sholder   BCC (basal cell carcinoma of skin) 05/09/2014   mid lower back   BCC (basal cell carcinoma of skin) 05/03/2017   right low back   BCC (basal cell carcinoma of skin) 05/03/2017   left upper back   BCC (basal cell carcinoma of skin) 07/05/2018   left mid back   BCC (basal cell carcinoma of skin) 05/20/1992   upper back   BCC (basal cell carcinoma of skin) 07/29/1993   left sholder medial   BCC (basal cell carcinoma of skin) 07/29/1993   left sholder lateral   BCC (basal cell carcinoma of skin) 07/29/1993   right thigh   BCC (basal  cell carcinoma of skin) 07/29/1993   right sholder   BCC (basal cell carcinoma of skin) 12/22/1994   right mid forearm   BCC (basal cell carcinoma of skin) 12/22/1994   right upper forearm   BCC (basal cell carcinoma of skin) 12/22/1994   lower right upper arm   BCC (basal cell carcinoma of skin) 12/22/1994   right upper arm sholder   BCC (basal cell carcinoma of skin) 08/11/1995   left leg below knee   BCC (basal cell carcinoma of skin) 04/11/2002   mid back   BCC (basal cell carcinoma of skin) 12/10/2002   right center upper back   BCC (basal cell carcinoma of skin) 05/26/2005   right post sholder   BCC (basal cell carcinoma) 05/09/2014   left inner shin   BCC (basal cell carcinoma) 06/12/2014   left forearm   Bowen's disease 10/07/1994   right post knee, right inner forearm/wrist   Bowen's disease 08/11/1995   left sholder   Cataract    left   Chronic pain    Depression    Diverticulosis of colon 1998   mild   DJD (degenerative joint disease)    Duodenal ulcer    h/o   GERD (gastroesophageal reflux disease)    History of SCC (squamous cell carcinoma) of skin    Dr. Denna Haggard  History of sinus bradycardia    HLD (hyperlipidemia)    hypertriglyceridemia   Hypertensive retinopathy of both eyes 11/18/2017   IBS (irritable bowel syndrome)    Internal hemorrhoids 1998   Mitral regurgitation    mild   Osteoarthritis    feet,shoulder,neck,back,hips and hands.   Pneumonia    PONV (postoperative nausea and vomiting)    Rotator cuff tear, right 02/2019   infraspinatus and supraspinatus, and dislocation of long head of bicep tendon (Dr. Alvan Dame)   SCC (squamous cell carcinoma) 11/26/2014   left hand, right hand, right deltoid   SCC (squamous cell carcinoma) 05/03/2017   left cheek   Sleep apnea    uses a mouth guard nightly   Squamous cell carcinoma in situ (SCCIS) 07/05/2018   left hand   Vitamin D deficiency    mild    Past Surgical History:  Procedure Laterality  Date   ABDOMINAL HYSTERECTOMY     BLEPHAROPLASTY Bilateral 01/2018   BROW LIFT Bilateral 10/10/2018   Procedure: BROW LIFT;  Surgeon: Wallace Going, DO;  Location: [place];  Service: Plastics;  Laterality: Bilateral;   BUNIONECTOMY     R 12/08, L 2004 (Dr. Janus Molder)   [place]   bilateral   CATARACT EXTRACTION Bilateral Left in 08/2012, Right 04/2016   Dr.Hecker   CESAREAN SECTION     x2   CHOLECYSTECTOMY  1992   CLOSED REDUCTION NASAL FRACTURE N/A 08/29/2018   Procedure: CLOSED REDUCTION NASAL FRACTURE;  Surgeon: Wallace Going, DO;  Location: [facility];  Service: Plastics;  Laterality: N/A;  1 hour, please   COLONOSCOPY  2006   COLONOSCOPY  01/2012   due again 01/2022; mild diverticulosis   epidural steroid injection, back  02/2010   HIP SURGERY     right bursectomy x 3   HIP SURGERY Right 2000   torn cartilage, repaired   INGUINAL HERNIA REPAIR  9/09   bilat   NECK SURGERY  1989   c6-7 cervical laminectomy and diskecotmy   NECK SURGERY  1989   per patient posterior area of neck   REVERSE SHOULDER ARTHROPLASTY Right 04/11/2019   Procedure: REVERSE SHOULDER ARTHROPLASTY;  Surgeon: Nicholes Stairs, MD;  Location: [place];  Service: Orthopedics;  Laterality: Right;  Regional Block   SHOULDER SURGERY  2/05   left rotator cuff repair   SHOULDER SURGERY Left 03/14/2018   rotator cuff repair; Dr. Tonita Cong   TONSILLECTOMY  age 47   TOTAL ABDOMINAL HYSTERECTOMY W/ BILATERAL SALPINGOOPHORECTOMY  1997   fibroids   TROCHANTERIC BURSA EXCISION Right 1978   TUBAL LIGATION  1986   WISDOM TOOTH EXTRACTION      Family History  Problem Relation Age of Onset   Depression Mother    Schizophrenia Mother    Cancer Father        oral   Heart disease Father        bradycardia   Esophageal cancer Father    Cancer Sister        skin and lung   COPD Sister    Hypertension Sister    Osteoporosis Sister        compression  fx's x 3 11/2018   Hyperthyroidism Brother    Cancer Brother        metastatic cancer to bone (?primary)   Hyperlipidemia Daughter    Asthma Son    Cancer Paternal Grandmother 67       colon  cancer, metastatic to liver   Colon cancer Paternal Grandmother    Liver cancer Paternal Grandmother    Diabetes Maternal Grandmother    ADD / ADHD Other     Social History   Occupational History   Occupation: STAFF NURSE    Employer: [employer]  Tobacco Use   Smoking status: Never   Smokeless tobacco: Never  Vaping Use   Vaping Use: Never used  Substance and Sexual Activity   Alcohol use: Yes    Alcohol/week: 7.0 standard drinks    Types: 7 Glasses of wine per week    Comment: 1 glass of wine daily   Drug use: No   Sexual activity: Not Currently     Physical Exam: Blood pressure 128/62, pulse 68, temperature 99 F (37.2 C), temperature source Oral, resp. rate 18, height 5\' 4"  (1.626 m), weight 146 lb 6.4 oz (66.4 kg), SpO2 96 %.  Gen:      No acute distress Lungs:    Clear to auscultation bilaterally, no wheezes or crackles CV:         Regular rate and rhythm; no murmurs, rubs, or gallops.  No pedal edema  Data Reviewed: Imaging: I have personally reviewed the CT Chest May 2021 which shows central and lower lobe bronchiectasis. TBM on expiratory cuts.   PFTs: May 2022 Spirometry WNL.   Sleep study June 2021 IMPRESSIONS - No significant obstructive sleep apnea occurred during this study (AHI = 1.9/h). - No significant central sleep apnea occurred during this study (CAI = 0.7/h). - The patient had minimal or no oxygen desaturation during the study (Min O2 = 89.0%) - The patient snored with soft snoring volume.  Labs: Lab Results  Component Value Date   WBC 4.6 05/29/2020   HGB 10.6 (L) 05/29/2020   HCT 33.6 (L) 05/29/2020   MCV 97.4 05/29/2020   PLT 171 05/29/2020   Lab Results  Component Value Date   NA 142 08/05/2020   K 4.3 08/05/2020   CL 106  08/05/2020   CO2 32 08/05/2020  Cr 0.59  Immunization status: Immunization History  Administered Date(s) Administered   Fluad Quad(high Dose 65+) 11/10/2019   Hepatitis A 06/17/2006, 01/05/2007   IPV 04/15/2012   Influenza Split 10/31/2013   Influenza, High Dose Seasonal PF 11/16/2016, 11/16/2017, 11/01/2018   Influenza,inj,quad, With Preservative 11/16/2017, 12/12/2018   Influenza-Unspecified 11/09/2014, 11/14/2015   PFIZER Comirnaty(Gray Top)Covid-19 Tri-Sucrose Vaccine 03/22/2020   PFIZER(Purple Top)SARS-COV-2 Vaccination 02/05/2019, 02/25/2019   Pneumococcal Conjugate-13 07/20/2013   Pneumococcal Polysaccharide-23 06/17/2006, 11/26/2014, 09/05/2019   Tdap 09/16/2004, 10/08/2014   Typhoid Live 06/17/2011, 06/10/2017   Unspecified SARS-COV-2 Vaccination 02/20/2019   Yellow Fever 07/08/2012   Zoster Recombinat (Shingrix) 04/28/2016, 08/15/2016   Zoster, Live 04/04/2009    Assessment:  Bronchiectasis with Nocardiosis on bactrim  Chronic rhinitis OSA with excessive daytime sleepiness - managed with oral appliance by dentistry.  Chronic cough with LPR Tracheobronchomalacia (TBM)  Diarrhea related to lymphocytic colitis  Plan/Recommendations:  Continue current therapy for LPR Continue rhinitis treatment with singulair, nasacort. May not be allergic rhinitis, can trial ipratropium nasal sprays if there is a vasomotor component.  Continue oral appliance for OSA.  Continue prn albuterol  May need to add back airway clearance if cough or mucus production worsens.    Return to Care: Return in about 6 months (around 02/27/2021).  Lenice Llamas, MD Pulmonary and [place]

## 2020-08-28 ENCOUNTER — Other Ambulatory Visit: Payer: Self-pay

## 2020-08-28 ENCOUNTER — Encounter: Payer: Self-pay | Admitting: Infectious Diseases

## 2020-08-28 ENCOUNTER — Ambulatory Visit: Payer: Medicare PPO | Admitting: Infectious Diseases

## 2020-08-28 VITALS — BP 146/67 | HR 57 | Temp 98.2°F | Ht 64.0 in | Wt 147.0 lb

## 2020-08-28 DIAGNOSIS — T7840XA Allergy, unspecified, initial encounter: Secondary | ICD-10-CM

## 2020-08-28 DIAGNOSIS — Z5181 Encounter for therapeutic drug level monitoring: Secondary | ICD-10-CM

## 2020-08-28 DIAGNOSIS — A439 Nocardiosis, unspecified: Secondary | ICD-10-CM | POA: Diagnosis not present

## 2020-08-28 LAB — BASIC METABOLIC PANEL
BUN: 18 mg/dL (ref 7–25)
CO2: 28 mmol/L (ref 20–32)
Calcium: 9.1 mg/dL (ref 8.6–10.4)
Chloride: 106 mmol/L (ref 98–110)
Creat: 0.86 mg/dL (ref 0.60–1.00)
Glucose, Bld: 78 mg/dL (ref 65–99)
Potassium: 4.3 mmol/L (ref 3.5–5.3)
Sodium: 140 mmol/L (ref 135–146)

## 2020-08-28 NOTE — Progress Notes (Addendum)
[facility]                                                             [place], [place], [place], [address]                                                                  Phn. [phone]; Fax: [phone]                                                                             Date: 08/28/20  Reason for Referral: Follow up for Nocardia pneumonia and diarrhea   Assessment Problem List Items Addressed This Visit       Other   Nocardia infection - Primary   Relevant Orders   Basic metabolic panel   Medication monitoring encounter   Allergies    Pulmonary nocardia infection ( 03/01/20 sputum AFB smear and cx negative ) Hypogammaglobulinemia Lymphocytic colitis: On budesonide, tapering dose Medication monitoring- BMP WNL  Bactrim allergy - has tolerated without any issues, will delete h/o bactrim allergy   Recommendations Continue  Bactrim 1 double strength tablet twice daily. Plan to treat through end of December 2022 Follow-up in 1 month, BMP at that time  Follow up for management of GERD/Asthma/LPR and Hypogammaglobulinemia per Pulmonary and Immunology    All questions and concerns were discussed and addressed. Patient verbalized understanding of the plan. ____________________________________________________________________________________________________________________ Subjective/Interval events 02/20/20 She says she has not had as much mucous plugs as she had before and it is more stringy stuff that comes out. Cough is usually  in the morning and more when she  moves around. She feels the bactrim has helped to get the phlegm out. However, she has not noticed any significant improvement in terms of SOB.    She says she was able to talk with her daughter around 45 minutes before sept 2020 but since that time she feels she gets SOB even when talking for 5-10 minutes with  her daughter. She feels the SOB has stayed the same since September 2020.  Denies any fever/chills but she says she sweats always. Appetite is good. No changes in weight. No N/V/D No urinary symptoms.   She feels tired but says the fatigue has gotten better as she does not need to sleep in the day as much as she used to before and takes a brief nap.    She is following up with Pulm and is also getting weekly IV immunoglobulins from her immunologist.   03/12/20 She has been having loose stool since January 8, which initially started as 3-4 episodes a day and was watery. However, it continued to get worse and started having 5-6 BMs a day. At one point ( Jan 20th), she stopped  taking all her meds including bactrim and she felt her diarrhea got somewhat better. She stopped it for 3 days and started taking PO Vancomycin around 1/18 which she has been taking until now. However, she felt the diarrhea came back right after she started taking all her meds including bactrim.    Denies any fevers, chills, sweats Denies nausea/vomiting.abdominal cramps/GU symptoms Denies recent travel, sick contact, outside food, family member having diarrhea, no new medications    In terms of cough, she feels its getting better, she has less mucous plugs. SOB has not completely gone away, but thinks there is some improvement. She does not seem to have SOB while talking to some one but if she talks for a longer time she feels SOB. She thinks she has lost  7 lbs since September 2021.     03/27/20 Beverly [name] is a 75 year old female with a history of mild persistent asthma, GERD, Bronchiectasis, OSA, Tracheomalacia, Primary immunodeficiency on immunoglobulin therapy, Rt reverse shoulder arthroplasty, skin malignancy here for follow up of Nocardia PNA and diarrhea. She has completed approx 5 months of bactrim therapy for Nocardia PNA. Bactrim was stopped last visit due to diarrhea that was thought to be related to bactrim. C  diff was negative.    She says her diarrhea got better since the bactrim was stopped and was doing well until this past Monday when she had few episodes of loose stool and started having solid stool from Tuesday. She says she is also going through some family issues and unsure if the diarrhea is related to it as it only lasted for 1 day. She also says that she saw some blood in the stool. She says last colonoscopy was 7 years ago. Denies any fevers, chills, sweats, abdominal cramps. Denies nausea, vomiting. Appetite is good. No new medicine or new food intake.    In terms of respiratory standpoint, she says cough is better 2-3 times a day. Phlegm is also less. SOB has been stable. She get SOB when talking for a long period of time.    05/29/20 Here for follow up of Nocardia pneumonia. She restarted bactrim on 3/10 after it was noticed that there was no improvement in her diarrhea after being off of bactrim. She has not noticed any changes in diarrhea since it started. Says every time she eats, she has 1-2 episodes of diarrhea if she does not take Imodium. She is taking 2 Imodium three times a day. She recently went to [place] for vacation on march 30 and came back a week ago. She was taken off of IVIG by Dr Neldon Mc and says she did not notice any improvement in the diarrhea. Stool is watery, foul smelling but no blood. Denies fevers, chils, nausea, vomiting and abdominal pain. She was also seen by GI and had colonoscopy done 4/8 results are pending. She says she has lost weight due to diarrhea.   In terms of respiratory symptoms, she is coughing less than before, produces mucous plugs which are less thick and less viscous. Phlegm is yellow in color Breathing has been the same. She mostly gets SOB when she talks but says she was able to walk without any difficulty when she recently went to [place]. She is able to do 30-45 minutes yard work at home. She is willing to trial Lomotil to help with her diarrhea.  I discussed side effects and need to be cautious with its use and preferably take it before sleep.   08/06/20  Here for follow-up of pulmonary nocardia infection.  She took her last dose of Bactrim this past Sunday and will be starting to take again from today.  She feels a lot better than when seen in the clinic last visit.  Her diarrhea which was previously thought to be related to Bactrim was found out to be secondary to lymphocytic colitis and she has been on steroids on a tapering dose.  Diarrhea has completely resolved.  She is also getting weekly immunoglobulin infusions with allergy/immunology.  Cough has significantly improved and she only coughs 5-6 times a day.  Phlegm has also improved and she has minimal greenish phlegm if any.  Shortness of breath has been the same and she states she gets short of breath when talking for 5 to 10 minutes.  She overall feels well.  I discussed with her about continuing Bactrim and plan to treat for longer duration given her hypogammaglobulinemia/being on steroids now.  She agrees to the plan.  I will follow-up in 1 month.   08/28/20 Here for follow up for Pulmonary Nocardia infection Taking Bactrim 1ds tab twice daily. Denies missing any doses. Denies any concern for side effects. BMP done on 71/12 was wnl. She is following with Pulmonary as well as Allergist and receiving immunoglobulin injections. She is also on tapering dose budesonide for her lymphocytic colitis. Diarrhea is improved.  She recently had a resection for carcinoma of skin of her left leg and left leg is bandaged today.   Cough has significantly improved, she has whitish phlegm when she coughs up with occasionally brownish phlegm which she thinks is helping her clear her airways. Breathing has been the same. Denies any fevers , chills, sweats. Denies any nausea, vomiting and abdominal pain. Discussed with her to continue bactrim until the end of year.  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   right sholder   BCC (basal cell carcinoma of skin) 05/09/2014   mid lower back   BCC (basal cell carcinoma of skin) 05/03/2017   right low back   BCC (basal cell carcinoma of skin) 05/03/2017   left upper back   BCC (basal cell carcinoma of skin) 07/05/2018   left mid back   BCC (basal cell carcinoma of skin) 05/20/1992   upper back   BCC (basal cell carcinoma of skin) 07/29/1993   left sholder medial   BCC (basal cell carcinoma of skin) 07/29/1993   left sholder lateral   BCC (basal cell carcinoma of skin) 07/29/1993   right thigh   BCC (basal cell carcinoma of skin) 07/29/1993   right sholder   BCC (basal cell carcinoma of skin) 12/22/1994   right mid forearm   BCC (basal cell carcinoma of skin) 12/22/1994   right upper forearm   BCC (basal cell carcinoma of skin) 12/22/1994   lower right upper arm   BCC (basal cell carcinoma of skin) 12/22/1994   right upper arm sholder   BCC (basal cell carcinoma of skin) 08/11/1995   left leg below knee   BCC (basal cell carcinoma of skin) 04/11/2002   mid back   BCC (basal cell carcinoma of skin) 12/10/2002   right center  upper back   BCC (basal cell carcinoma of skin) 05/26/2005   right post sholder   BCC (basal cell carcinoma) 05/09/2014   left inner shin   BCC (basal cell carcinoma) 06/12/2014   left forearm   Bowen's disease 10/07/1994   right post knee, right inner forearm/wrist   Bowen's disease 08/11/1995   left sholder   Cataract    left   Chronic pain    Depression    Diverticulosis of colon 1998   mild   DJD (degenerative joint disease)    Duodenal ulcer    h/o   GERD (gastroesophageal reflux disease)    History of SCC (squamous cell  carcinoma) of skin    Dr. Denna Haggard   History of sinus bradycardia    HLD (hyperlipidemia)    hypertriglyceridemia   Hypertensive retinopathy of both eyes 11/18/2017   IBS (irritable bowel syndrome)    Internal hemorrhoids 1998   Mitral regurgitation    mild   Osteoarthritis    feet,shoulder,neck,back,hips and hands.   Pneumonia    PONV (postoperative nausea and vomiting)    Rotator cuff tear, right 02/2019   infraspinatus and supraspinatus, and dislocation of long head of bicep tendon (Dr. Alvan Dame)   SCC (squamous cell carcinoma) 11/26/2014   left hand, right hand, right deltoid   SCC (squamous cell carcinoma) 05/03/2017   left cheek   Sleep apnea    uses a mouth guard nightly   Squamous cell carcinoma in situ (SCCIS) 07/05/2018   left hand   Vitamin D deficiency    mild   Past Surgical History:  Procedure Laterality Date   ABDOMINAL HYSTERECTOMY     BLEPHAROPLASTY Bilateral 01/2018   BROW LIFT Bilateral 10/10/2018   Procedure: BROW LIFT;  Surgeon: Wallace Going, DO;  Location: [place];  Service: Plastics;  Laterality: Bilateral;   BUNIONECTOMY     R 12/08, L 2004 (Dr. Janus Molder)   [place]   bilateral   CATARACT EXTRACTION Bilateral Left in 08/2012, Right 04/2016   Dr.Hecker   CESAREAN SECTION     x2   CHOLECYSTECTOMY  1992   CLOSED REDUCTION NASAL FRACTURE N/A 08/29/2018   Procedure: CLOSED REDUCTION NASAL FRACTURE;  Surgeon: Wallace Going, DO;  Location: [place];  Service: Plastics;  Laterality: N/A;  1 hour, please   COLONOSCOPY  2006   COLONOSCOPY  01/2012   due again 01/2022; mild diverticulosis   epidural steroid injection, back  02/2010   HIP SURGERY     right bursectomy x 3   HIP SURGERY Right 2000   torn cartilage, repaired   INGUINAL HERNIA REPAIR  9/09   bilat   NECK SURGERY  1989   c6-7 cervical laminectomy and diskecotmy   NECK SURGERY  1989   per patient posterior area of neck    REVERSE SHOULDER ARTHROPLASTY Right 04/11/2019   Procedure: REVERSE SHOULDER ARTHROPLASTY;  Surgeon: Nicholes Stairs, MD;  Location: [place];  Service: Orthopedics;  Laterality: Right;  [facility]   SHOULDER SURGERY  2/05   left rotator cuff repair   SHOULDER SURGERY Left 03/14/2018   rotator cuff repair; Dr. Tonita Cong   TONSILLECTOMY  age 71   TOTAL ABDOMINAL HYSTERECTOMY W/ BILATERAL SALPINGOOPHORECTOMY  1997   fibroids   TROCHANTERIC BURSA EXCISION Right [place]   WISDOM TOOTH EXTRACTION     Current Outpatient Medications on File Prior to Visit  Medication  Sig Dispense Refill   acetaminophen (TYLENOL) 500 MG tablet Take 1,000 mg by mouth every 6 (six) hours as needed for moderate pain.      albuterol (PROVENTIL HFA;VENTOLIN HFA) 108 (90 Base) MCG/ACT inhaler Inhale 2 puffs into the lungs every 6 (six) hours as needed for wheezing or shortness of breath. 18 g 1   aspirin EC 81 MG tablet Take 81 mg by mouth daily.     atorvastatin (LIPITOR) 40 MG tablet Take 1 tablet by mouth daily. 90 tablet 1   budesonide (ENTOCORT EC) 3 MG 24 hr capsule Take 2 capsules (6 mg total) by mouth daily. Take 2 capsules for 2 weeks and then 1 capsule for 2 weeks. 42 capsule 0   Buprenorphine HCl (BELBUCA) 450 MCG FILM Place 1 film to inside of mouth every twelve hours 60 Film 2   Calcium Carbonate-Vitamin D 600-200 MG-UNIT TABS Take 1 tablet by mouth 2 (two) times daily.     celecoxib (CELEBREX) 200 MG capsule TAKE 1 CAPSULE BY MOUTH 2 TIMES DAILY 180 capsule 1   colestipol (COLESTID) 1 g tablet Take 1/2-1 tablet  3 times per day 90 tablet 3   diclofenac Sodium (VOLTAREN) 1 % GEL Apply 2 g topically 4 (four) times daily as needed (joint pain).     esomeprazole (NEXIUM) 40 MG capsule TAKE 1 CAPSULE BY MOUTH DAILY IN THE MORNING 90 capsule 1   famotidine (PEPCID) 40 MG tablet TAKE 1 TABLET BY MOUTH AT BEDTIME. 90 tablet 1   gabapentin (NEURONTIN) 800 MG tablet TAKE 1 TABLET BY MOUTH ONCE  DAILY AT BEDTIME 90 tablet 2   GUAIFENESIN ER PO Take 400 mg by mouth 2 (two) times daily.     HYDROcodone-acetaminophen (NORCO) 7.5-325 MG tablet Take 1 tablet by mouth every 12 (twelve) hours as needed for moderate pain. Dr Hardin Negus     Immune Globulin, Human,-klhw (XEMBIFY) 10 GM/50ML SOLN Xembify 10 gram/50 mL (20 %) subcutaneous solution  INJECT 10G SUBCUTANEOUSLY EVERY WEEK     ipratropium (ATROVENT) 0.03 % nasal spray Place 1 spray into both nostrils 3 (three) times daily. 30 mL 12   Melatonin-Pyridoxine (MELATIN PO)      methocarbamol (ROBAXIN) 500 MG tablet Take 1 tablet (500 mg total) by mouth 3 (three) times daily. 360 tablet 0   montelukast (SINGULAIR) 10 MG tablet TAKE 1 TABLET BY MOUTH AT BEDTIME. 90 tablet 1   Multiple Vitamins-Minerals (MULTIVITAMIN WITH MINERALS) tablet Take 1 tablet by mouth daily.     omega-3 acid ethyl esters (LOVAZA) 1 g capsule TAKE 2 CAPSULES BY MOUTH 2 TIMES DAILY 180 capsule 1   ondansetron (ZOFRAN) 4 MG tablet TAKE 1 TABLET BY MOUTH EVERY 8 HOURS AS NEEDED FOR NAUSEA AND/OR VOMITING 60 tablet 2   PARoxetine (PAXIL) 30 MG tablet TAKE 2 TABLETS BY MOUTH ONCE A DAY (Patient taking differently: Take 30 mg by mouth daily.) 180 tablet 1   Respiratory Therapy Supplies (FLUTTER) DEVI 1 puff by Does not apply route 2 (two) times daily. 1 each 0   sulfamethoxazole-trimethoprim (BACTRIM DS) 800-160 MG tablet Take 1 tablet by mouth 2 (two) times daily. 60 tablet 2   triamcinolone (NASACORT) 55 MCG/ACT AERO nasal inhaler Place 2 sprays into the nose daily.     [DISCONTINUED] potassium chloride (KLOR-CON) 10 MEQ tablet Take 2 tablets (total 20 MEQ) by mouth once 2 tablet 0   No current facility-administered medications on file prior to visit.    Allergies  Allergen Reactions  Bactrim [Sulfamethoxazole-Trimethoprim] Diarrhea and Nausea And Vomiting    Severe nausea, vomiting, and diarrhea   Adhesive [Tape] Rash   Codeine Rash   Social History   Socioeconomic  History   Marital status: Divorced    Spouse name: Not on file   Number of children: 2   Years of education: Not on file   Highest education level: Not on file  Occupational History   Occupation: STAFF NURSE    Employer: [employer]  Tobacco Use   Smoking status: Never   Smokeless tobacco: Never  Vaping Use   Vaping Use: Never used  Substance and Sexual Activity   Alcohol use: Yes    Alcohol/week: 7.0 standard drinks    Types: 7 Glasses of wine per week    Comment: 1 glass of wine daily   Drug use: No   Sexual activity: Not Currently  Other Topics Concern   Not on file  Social History Narrative   Divorced, lives alone, [place]; 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   [place] for [employer] part-time   Social Determinants of Health   Financial Resource Strain: Not on file  Food Insecurity: Not on file  Transportation Needs: Not on file  Physical Activity: Not on file  Stress: Not on file  Social Connections: Not on file  Intimate Partner Violence: Not on file    Vitals BP (!) 146/67   Pulse (!) 57   Temp 98.2 F (36.8 C) (Oral)   Ht 5\' 4"  (1.626 m)   Wt 147 lb (66.7 kg)   SpO2 99%   BMI 25.23 kg/m    Examination  General - not in acute distress, comfortably sitting in chair HEENT - no pallor and no icterus Chest - b/l clear air entry, no additional sounds CVS- Normal O9B3, systolic murmur Abdomen - Soft, Non tender , non distended Ext- no pedal edema, Left leg is bandaged Neuro: grossly normal Back - WNL Psych : calm and cooperative   Recent labs CBC Latest Ref Rng & Units 05/29/2020 05/06/2020 03/28/2020  WBC 3.8 - 10.8 Thousand/uL 4.6 5.2 4.3  Hemoglobin 11.7 - 15.5 g/dL 10.6(L) 11.2(L) 11.8  Hematocrit 35.0 - 45.0 % 33.6(L) 33.1(L) 35.9  Platelets 140 - 400 Thousand/uL 171 135(L) 123(L)   CMP Latest Ref Rng &  Units 08/27/2020 08/05/2020 05/29/2020  Glucose 65 - 99 mg/dL 78 58(L) 80  BUN 7 - 25 mg/dL 18 21 10   Creatinine 0.60 - 1.00 mg/dL 0.86 0.59(L) 0.71  Sodium 135 - 146 mmol/L 140 142 141  Potassium 3.5 - 5.3 mmol/L 4.3 4.3 4.0  Chloride 98 - 110 mmol/L 106 106 107  CO2 20 - 32 mmol/L 28 32 26  Calcium 8.6 - 10.4 mg/dL 9.1 8.8 8.5(L)  Total Protein 6.1 - 8.1 g/dL - - 5.5(L)  Total Bilirubin 0.2 - 1.2 mg/dL - - 0.3  Alkaline Phos 39 - 117 IU/L - - -  AST 10 - 35 U/L - - 20  ALT 6 - 29 U/L - - 13    Pertinent Microbiology Results for orders placed or performed in visit on 04/01/20  Ova and parasite examination     Status: None   Collection Time: 04/01/20 11:54 AM   Specimen: Stool  Result Value Ref Range Status   MICRO NUMBER: [id number]  Final   SPECIMEN QUALITY: Adequate  Final   Source  STOOL  Final   STATUS: FINAL  Final   CONCENTRATE RESULT: No ova or parasites seen  Final   TRICHROME RESULT: No ova or parasites seen  Final   COMMENT:   Final    Routine Ova and Parasite exam may not detect some parasites that occasionally cause diarrheal illness. Cryptosporidium Antigen and/or Cyclospora and Isospora Exam may be ordered to detect these parasites. One negative sample does not necessarily rule out  the presence of a parasitic infection.  For additional information, please refer to https://education.[facility].com/faq/FAQ203 (This link is being provided for informational/ educational purposes only.)   Fecal leukocytes     Status: None   Collection Time: 04/01/20 11:54 AM   Specimen: Stool  Result Value Ref Range Status   Micro Number [id number]  Final   Specimen Quality Adequate  Final   Source STOOL  Final   Status FINAL  Final   Result Not Detected  Final    Comment: Reference Range:Not Detected   Pertinent Imaging All pertinent labs/Imagings/notes reviewed. All pertinent plain films and CT images have been personally visualized and interpreted; radiology reports have been  reviewed. Decision making incorporated into the Impression / Recommendations.  I have spent 35 minutes for this patient encounter including  review of prior medical records with greater than 50% of time in face to face counsel of the patient/discussing diagnostics and plan of care.   Electronically signed by:  Rosiland Oz, MD Infectious Disease Physician [facility] [address] [place], [place] [address] Phone: [phone]  Fax: [phone]

## 2020-08-30 DIAGNOSIS — M545 Low back pain, unspecified: Secondary | ICD-10-CM | POA: Diagnosis not present

## 2020-09-02 ENCOUNTER — Other Ambulatory Visit (HOSPITAL_COMMUNITY): Payer: Self-pay

## 2020-09-02 ENCOUNTER — Other Ambulatory Visit: Payer: Self-pay | Admitting: Infectious Diseases

## 2020-09-02 DIAGNOSIS — M545 Low back pain, unspecified: Secondary | ICD-10-CM | POA: Diagnosis not present

## 2020-09-02 DIAGNOSIS — M25551 Pain in right hip: Secondary | ICD-10-CM | POA: Diagnosis not present

## 2020-09-02 MED ORDER — SULFAMETHOXAZOLE-TRIMETHOPRIM 800-160 MG PO TABS
1.0000 | ORAL_TABLET | Freq: Two times a day (BID) | ORAL | 1 refills | Status: AC
  Filled 2020-09-02: qty 180, 90d supply, fill #0
  Filled 2020-11-17: qty 180, 90d supply, fill #1

## 2020-09-06 DIAGNOSIS — M25551 Pain in right hip: Secondary | ICD-10-CM | POA: Diagnosis not present

## 2020-09-06 DIAGNOSIS — M545 Low back pain, unspecified: Secondary | ICD-10-CM | POA: Diagnosis not present

## 2020-09-09 ENCOUNTER — Other Ambulatory Visit (HOSPITAL_COMMUNITY): Payer: Self-pay

## 2020-09-09 ENCOUNTER — Other Ambulatory Visit: Payer: Self-pay | Admitting: Internal Medicine

## 2020-09-09 MED ORDER — OMEGA-3-ACID ETHYL ESTERS 1 G PO CAPS
ORAL_CAPSULE | ORAL | 1 refills | Status: DC
  Filled 2020-09-09: qty 360, 90d supply, fill #0

## 2020-09-10 DIAGNOSIS — M545 Low back pain, unspecified: Secondary | ICD-10-CM | POA: Diagnosis not present

## 2020-09-13 DIAGNOSIS — M25551 Pain in right hip: Secondary | ICD-10-CM | POA: Diagnosis not present

## 2020-09-15 ENCOUNTER — Other Ambulatory Visit: Payer: Self-pay | Admitting: Internal Medicine

## 2020-09-15 DIAGNOSIS — J45998 Other asthma: Secondary | ICD-10-CM

## 2020-09-15 DIAGNOSIS — J309 Allergic rhinitis, unspecified: Secondary | ICD-10-CM

## 2020-09-15 MED FILL — Paroxetine HCl Tab 30 MG: ORAL | 90 days supply | Qty: 180 | Fill #0 | Status: AC

## 2020-09-16 ENCOUNTER — Other Ambulatory Visit (HOSPITAL_COMMUNITY): Payer: Self-pay

## 2020-09-16 MED ORDER — MONTELUKAST SODIUM 10 MG PO TABS
ORAL_TABLET | Freq: Every day | ORAL | 0 refills | Status: DC
  Filled 2020-09-16: qty 60, 60d supply, fill #0

## 2020-09-17 ENCOUNTER — Other Ambulatory Visit: Payer: Medicare PPO

## 2020-09-17 ENCOUNTER — Other Ambulatory Visit: Payer: Self-pay

## 2020-09-17 DIAGNOSIS — Z5181 Encounter for therapeutic drug level monitoring: Secondary | ICD-10-CM | POA: Diagnosis not present

## 2020-09-17 DIAGNOSIS — D839 Common variable immunodeficiency, unspecified: Secondary | ICD-10-CM | POA: Diagnosis not present

## 2020-09-17 NOTE — Addendum Note (Signed)
Addended by: Clovis Cao A on: 09/17/2020 09:39 AM   Modules accepted: Orders

## 2020-09-17 NOTE — Addendum Note (Signed)
Addended by: Clovis Cao A on: 09/17/2020 09:55 AM   Modules accepted: Orders

## 2020-09-18 ENCOUNTER — Encounter: Payer: Self-pay | Admitting: Nurse Practitioner

## 2020-09-18 ENCOUNTER — Ambulatory Visit: Payer: Medicare PPO | Admitting: Nurse Practitioner

## 2020-09-18 VITALS — BP 102/60 | HR 65 | Ht 65.0 in | Wt 148.0 lb

## 2020-09-18 DIAGNOSIS — K5900 Constipation, unspecified: Secondary | ICD-10-CM

## 2020-09-18 DIAGNOSIS — K52832 Lymphocytic colitis: Secondary | ICD-10-CM | POA: Diagnosis not present

## 2020-09-18 LAB — BASIC METABOLIC PANEL
BUN: 16 mg/dL (ref 7–25)
CO2: 30 mmol/L (ref 20–32)
Calcium: 8.9 mg/dL (ref 8.6–10.4)
Chloride: 105 mmol/L (ref 98–110)
Creat: 0.89 mg/dL (ref 0.60–1.00)
Glucose, Bld: 79 mg/dL (ref 65–99)
Potassium: 4.7 mmol/L (ref 3.5–5.3)
Sodium: 140 mmol/L (ref 135–146)

## 2020-09-18 LAB — IGG: IgG (Immunoglobin G), Serum: 948 mg/dL (ref 586–1602)

## 2020-09-18 NOTE — Progress Notes (Signed)
ASSESSMENT AND PLAN    # 75 year old female with Lymphoytic colitis diagnosed April 2022. Completing course of Budesonide today, now with constipation consisting of hard stool  --Start with two stools softeners at bedtime.  If no improvement then add 1 capful of MiraLAX in 8 ounces of water daily as needed. After our visit today I noticed that patient reported that she was still taking Colestid. Will contact her and make sure this is discontinued since diarrhea resoled.  --Unfortunately patient needs to remain on H2 blocker and PPI ( GERD / ? LPR) . She is also on an SSRI. All of these medications have been associated with an increased risk of lymphocytic colitis so she could be at risk for future recurrences --follow up as needed   # Malodorous flatus. Unclear etiology but predates constipation. SIBO possible though she is on chronic Bactrim (pulmonary nocardia infection) which is sometimes used to treat SIBO.   # GERD / chronic cough with ? LPR. On chronic PPI + H2 blocker. No significant heartburn on current medication regimen.     # Hypogammaglobinemia, followed by Immunology.  Gets IVIG infusions  HISTORY OF PRESENT ILLNESS    Chief Complaint : follow up on lymphocytic colitis.   Beverly [name] is a 75 y.o. female known to Dr. Bryan Lemma  with a past medical history significant for lymphocytic colitis ( April 2022), nocardia pulmonary infection . See PMH below for any additional medical problems.    I saw patient in March 2022 for diarrhea. Stool studies were negative. Subsequent colonoscopy with random biopsies was remarkable for lymphocytic colitis. The beginning of June she was given budesonide 9 mg daily for 6 to 8 weeks then taper to 6 mg x 2 weeks, 3 mg x 2 weeks, then discontinue.    INTERVAL HISTORY :  Here for follow up. Diarrhea improved within a few days on Budesonide. She completes the full course of treatment  today. Now she has been constipation with hard stools.   She drinks a lot of water, eats fruit and salads. In addition to constipation she has very malodorous flatulence, she wonders if it could be related to budesonide  PREVIOUS ENDOSCOPIC EVALUATIONS / PERTINENT STUDIES:   April 2022 Colonoscopy for diarrhea The entire examined colon is normal. Biopsied. - One 2 mm polyp in the distal rectum, removed with a cold biopsy forceps. Resected and retrieved. - Non-bleeding internal hemorrhoids. - The examined portion of the ileum was normal. - There was significant looping of the colon.  Diagnosis 1. Surgical [P], right colon bx - COLITIS CONSISTENT WITH LYMPHOCYTIC COLITIS. - NO ACTIVE INFLAMMATION OR GRANULOMAS. 2. Surgical [P], left colon bx - COLITIS CONSISTENT WITH LYMPHOCYTIC COLITIS. - NO ACTIVE INFLAMMATION OR GRANULOMAS. 3. Surgical [P], colon, rectum, polyp (1) - FINDINGS CONSISTENT WITH MUCOSAL PROLAPSE POLYP. - NO ADENOMATOUS CHANGE OR CARCINOMA. 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   right sholder   BCC (basal cell carcinoma of skin) 05/09/2014   mid lower back   BCC (basal cell carcinoma of skin) 05/03/2017   right low back   BCC (basal cell carcinoma of skin) 05/03/2017   left upper back   BCC (basal cell carcinoma of skin)  07/05/2018   left mid back   BCC (basal cell carcinoma of skin) 05/20/1992   upper back   BCC (basal cell carcinoma of skin) 07/29/1993   left sholder medial   BCC (basal cell carcinoma of skin) 07/29/1993   left sholder lateral   BCC (basal cell carcinoma of skin) 07/29/1993   right thigh   BCC (basal cell carcinoma of skin) 07/29/1993   right sholder   BCC (basal cell carcinoma of skin) 12/22/1994   right mid forearm   BCC (basal cell carcinoma of skin) 12/22/1994   right upper  forearm   BCC (basal cell carcinoma of skin) 12/22/1994   lower right upper arm   BCC (basal cell carcinoma of skin) 12/22/1994   right upper arm sholder   BCC (basal cell carcinoma of skin) 08/11/1995   left leg below knee   BCC (basal cell carcinoma of skin) 04/11/2002   mid back   BCC (basal cell carcinoma of skin) 12/10/2002   right center upper back   BCC (basal cell carcinoma of skin) 05/26/2005   right post sholder   BCC (basal cell carcinoma) 05/09/2014   left inner shin   BCC (basal cell carcinoma) 06/12/2014   left forearm   Bowen's disease 10/07/1994   right post knee, right inner forearm/wrist   Bowen's disease 08/11/1995   left sholder   Bronchiectasis ([place])    Cataract    left   Chronic pain    Depression    Diverticulosis of colon 1998   mild   DJD (degenerative joint disease)    Duodenal ulcer    h/o   GERD (gastroesophageal reflux disease)    History of SCC (squamous cell carcinoma) of skin    Dr. Denna Haggard   History of sinus bradycardia    HLD (hyperlipidemia)    hypertriglyceridemia   Hypertensive retinopathy of both eyes 11/18/2017   IBS (irritable bowel syndrome)    Internal hemorrhoids 1998   Lymphocytic colitis    Mitral regurgitation    mild   Nocardiosis    Osteoarthritis    feet,shoulder,neck,back,hips and hands.   Pneumonia    PONV (postoperative nausea and vomiting)    Rotator cuff tear, right 02/2019   infraspinatus and supraspinatus, and dislocation of long head of bicep tendon (Dr. Alvan Dame)   SCC (squamous cell carcinoma) 11/26/2014   left hand, right hand, right deltoid   SCC (squamous cell carcinoma) 05/03/2017   left cheek   Sleep apnea    uses a mouth guard nightly   Squamous cell carcinoma in situ (SCCIS) 07/05/2018   left hand   Tracheobronchomalacia    Vitamin D deficiency    mild    Current Medications, Allergies, Past Surgical History, Family History and Social History were reviewed in [employer]  record.   Current Outpatient Medications  Medication Sig Dispense Refill   acetaminophen (TYLENOL) 500 MG tablet Take 1,000 mg by mouth every 6 (six) hours as needed for moderate pain.      albuterol (PROVENTIL HFA;VENTOLIN HFA) 108 (90 Base) MCG/ACT inhaler Inhale 2 puffs into the lungs every 6 (six) hours as needed for wheezing or shortness of breath. 18 g 1   aspirin EC 81 MG tablet Take 81 mg by mouth daily.     atorvastatin (LIPITOR) 40 MG tablet Take 1 tablet by mouth daily. 90 tablet 1   budesonide (ENTOCORT EC) 3 MG 24 hr capsule Take 2 capsules (6 mg total) by mouth daily. Take  2 capsules for 2 weeks and then 1 capsule for 2 weeks. 42 capsule 0   Buprenorphine HCl (BELBUCA) 450 MCG FILM Place 1 film to inside of mouth every twelve hours 60 Film 2   Calcium Carbonate-Vitamin D 600-200 MG-UNIT TABS Take 1 tablet by mouth 2 (two) times daily.     celecoxib (CELEBREX) 200 MG capsule TAKE 1 CAPSULE BY MOUTH 2 TIMES DAILY 180 capsule 1   colestipol (COLESTID) 1 g tablet Take 1/2-1 tablet  3 times per day (Patient taking differently: One per day) 90 tablet 3   diclofenac Sodium (VOLTAREN) 1 % GEL Apply 2 g topically 4 (four) times daily as needed (joint pain).     esomeprazole (NEXIUM) 40 MG capsule TAKE 1 CAPSULE BY MOUTH DAILY IN THE MORNING 90 capsule 1   famotidine (PEPCID) 40 MG tablet TAKE 1 TABLET BY MOUTH AT BEDTIME. 90 tablet 1   gabapentin (NEURONTIN) 800 MG tablet TAKE 1 TABLET BY MOUTH ONCE DAILY AT BEDTIME 90 tablet 2   GUAIFENESIN ER PO Take 400 mg by mouth 2 (two) times daily.     HYDROcodone-acetaminophen (NORCO) 7.5-325 MG tablet Take 1 tablet by mouth every 12 (twelve) hours as needed for moderate pain. Dr Hardin Negus     Immune Globulin, Human,-klhw (XEMBIFY) 10 GM/50ML SOLN Xembify 10 gram/50 mL (20 %) subcutaneous solution  INJECT 10G SUBCUTANEOUSLY EVERY WEEK     ipratropium (ATROVENT) 0.03 % nasal spray Place 1 spray into both nostrils 3 (three) times daily. 30 mL 12    Melatonin-Pyridoxine (MELATIN PO)      methocarbamol (ROBAXIN) 500 MG tablet Take 1 tablet (500 mg total) by mouth 3 (three) times daily. 360 tablet 0   montelukast (SINGULAIR) 10 MG tablet TAKE 1 TABLET BY MOUTH AT BEDTIME. 60 tablet 0   Multiple Vitamins-Minerals (MULTIVITAMIN WITH MINERALS) tablet Take 1 tablet by mouth daily.     omega-3 acid ethyl esters (LOVAZA) 1 g capsule TAKE 2 CAPSULES BY MOUTH 2 TIMES DAILY 180 capsule 1   ondansetron (ZOFRAN) 4 MG tablet TAKE 1 TABLET BY MOUTH EVERY 8 HOURS AS NEEDED FOR NAUSEA AND/OR VOMITING 60 tablet 2   PARoxetine (PAXIL) 30 MG tablet TAKE 2 TABLETS BY MOUTH ONCE A DAY (Patient taking differently: Take 30 mg by mouth daily.) 180 tablet 1   Respiratory Therapy Supplies (FLUTTER) DEVI 1 puff by Does not apply route 2 (two) times daily. 1 each 0   sulfamethoxazole-trimethoprim (BACTRIM DS) 800-160 MG tablet Take 1 tablet by mouth 2 (two) times daily. 180 tablet 1   triamcinolone (NASACORT) 55 MCG/ACT AERO nasal inhaler Place 2 sprays into the nose daily.     No current facility-administered medications for this visit.    Review of Systems: No chest pain. No shortness of breath. No urinary complaints.   PHYSICAL EXAM :    Wt Readings from Last 3 Encounters:  09/18/20 148 lb (67.1 kg)  08/28/20 147 lb (66.7 kg)  08/27/20 146 lb 6.4 oz (66.4 kg)    BP 102/60   Pulse 65   Ht '5\' 5"'$  (1.651 m)   Wt 148 lb (67.1 kg)   BMI 24.63 kg/m  Constitutional:  Pleasant female in no acute distress. Psychiatric: Normal mood and affect. Behavior is normal. EENT: Pupils normal.  Conjunctivae are normal. No scleral icterus. Neck supple.  Cardiovascular: Normal rate, regular rhythm. No edema Pulmonary/chest: Effort normal and breath sounds normal. No wheezing, rales or rhonchi. Abdominal: Soft, nondistended, nontender. Bowel sounds active throughout.  There are no masses palpable. No hepatomegaly. Neurological: Alert and oriented to person place and  time. Skin: Skin is warm and dry. No rashes noted.  Tye Savoy, NP  09/18/2020, 11:22 AM

## 2020-09-18 NOTE — Patient Instructions (Signed)
Take 2 stool softener at bedtime, if stools are still hard with stool softener then use Miralax - 1 capful in at at least 8 ounces of water daily.   Follow up as needed.   If you are age 73 or older, your body mass index should be between 23-30. Your Body mass index is 24.63 kg/m. If this is out of the aforementioned range listed, please consider follow up with your Primary Care Provider.  If you are age 67 or younger, your body mass index should be between 19-25. Your Body mass index is 24.63 kg/m. If this is out of the aformentioned range listed, please consider follow up with your Primary Care Provider.   __________________________________________________________  The [facility] providers would like to encourage you to use [facility] to communicate with providers for non-urgent requests or questions.  Due to long hold times on the telephone, sending your provider a message by [facility] may be a faster and more efficient way to get a response.  Please allow 48 business hours for a response.  Please remember that this is for non-urgent requests.   Thank you for choosing me and [facility].  Tye Savoy NP

## 2020-09-19 ENCOUNTER — Telehealth: Payer: Self-pay | Admitting: General Surgery

## 2020-09-19 NOTE — Telephone Encounter (Signed)
-----   Message from Letta Pate, [place] sent at 06/19/2020  2:51 PM EDT ----- Regarding: budesonide rx for '6mg'$ , '3mg'$

## 2020-09-19 NOTE — Telephone Encounter (Signed)
Patient completed Budesonide.

## 2020-09-20 DIAGNOSIS — M545 Low back pain, unspecified: Secondary | ICD-10-CM | POA: Diagnosis not present

## 2020-09-20 NOTE — Progress Notes (Signed)
Agree with the assessment and plan as outlined by Paula Guenther, NP. ° °Corina Stacy, DO, FACG ° °

## 2020-09-23 ENCOUNTER — Other Ambulatory Visit (HOSPITAL_COMMUNITY): Payer: Self-pay

## 2020-09-24 ENCOUNTER — Other Ambulatory Visit (HOSPITAL_COMMUNITY): Payer: Self-pay

## 2020-09-26 ENCOUNTER — Other Ambulatory Visit (HOSPITAL_COMMUNITY): Payer: Self-pay

## 2020-09-27 DIAGNOSIS — M25551 Pain in right hip: Secondary | ICD-10-CM | POA: Diagnosis not present

## 2020-09-28 ENCOUNTER — Other Ambulatory Visit: Payer: Self-pay | Admitting: Allergy and Immunology

## 2020-09-30 ENCOUNTER — Other Ambulatory Visit: Payer: Medicare PPO

## 2020-09-30 ENCOUNTER — Other Ambulatory Visit (HOSPITAL_COMMUNITY): Payer: Self-pay

## 2020-09-30 ENCOUNTER — Other Ambulatory Visit: Payer: Self-pay

## 2020-09-30 MED ORDER — BUDESONIDE 3 MG PO CPEP
9.0000 mg | ORAL_CAPSULE | Freq: Every day | ORAL | 0 refills | Status: DC
  Filled 2020-09-30: qty 90, 30d supply, fill #0

## 2020-10-01 ENCOUNTER — Encounter: Payer: Self-pay | Admitting: Allergy and Immunology

## 2020-10-01 ENCOUNTER — Other Ambulatory Visit: Payer: Self-pay

## 2020-10-01 ENCOUNTER — Encounter: Payer: Self-pay | Admitting: Infectious Diseases

## 2020-10-01 ENCOUNTER — Ambulatory Visit: Payer: Medicare PPO | Admitting: Allergy and Immunology

## 2020-10-01 ENCOUNTER — Ambulatory Visit: Payer: Medicare PPO | Admitting: Infectious Diseases

## 2020-10-01 VITALS — BP 138/72 | HR 65 | Temp 98.5°F | Resp 14

## 2020-10-01 VITALS — BP 131/81 | HR 64 | Temp 98.3°F | Wt 143.0 lb

## 2020-10-01 DIAGNOSIS — D801 Nonfamilial hypogammaglobulinemia: Secondary | ICD-10-CM

## 2020-10-01 DIAGNOSIS — K219 Gastro-esophageal reflux disease without esophagitis: Secondary | ICD-10-CM | POA: Diagnosis not present

## 2020-10-01 DIAGNOSIS — J479 Bronchiectasis, uncomplicated: Secondary | ICD-10-CM

## 2020-10-01 DIAGNOSIS — A439 Nocardiosis, unspecified: Secondary | ICD-10-CM

## 2020-10-01 DIAGNOSIS — K52832 Lymphocytic colitis: Secondary | ICD-10-CM | POA: Diagnosis not present

## 2020-10-01 DIAGNOSIS — Z5181 Encounter for therapeutic drug level monitoring: Secondary | ICD-10-CM

## 2020-10-01 DIAGNOSIS — F1920 Other psychoactive substance dependence, uncomplicated: Secondary | ICD-10-CM | POA: Diagnosis not present

## 2020-10-01 DIAGNOSIS — D839 Common variable immunodeficiency, unspecified: Secondary | ICD-10-CM | POA: Diagnosis not present

## 2020-10-01 NOTE — Progress Notes (Addendum)
[facility]                                                             [place], [place], [place], [address]                                                                  Phn. [phone]; Fax: [phone]                                                                             Date:10/01/20  Reason for Referral: Follow up for Nocardia pneumonia and diarrhea   Assessment Problem List Items Addressed This Visit       Other   Nocardia infection   Medication monitoring encounter - Primary   Hypogammaglobulinemia ([place])   Other Visit Diagnoses     Lymphocytic colitis          Pulmonary nocardia infection ( 03/01/20 sputum AFB smear and cx negative ) Hypogammaglobulinemia- on weekly immunoglobulin injections, follows Allergist Lymphocytic colitis: restarted on budesonide Medication monitoring- BMP WNL on 09/17/20  Recommendations Continue  Bactrim 1 double strength tablet twice daily. Plan to treat through end of December 2022 Follow-up in 2 months , she will do a BMP with her PCP in september  Follow up for management of GERD/Asthma/LPR and Hypogammaglobulinemia per Pulmonary and Immunology    All questions and concerns were discussed and addressed. Patient verbalized understanding of the plan. ____________________________________________________________________________________________________________________ Subjective/Interval events 02/20/20 She says she has not had as much mucous plugs as she had before and it is more stringy stuff that comes out. Cough is usually  in the morning and more when she  moves around. She feels the bactrim has helped to get the phlegm out. However, she has not noticed any significant improvement in terms of SOB.    She says she was able to talk with her daughter around 45 minutes before sept 2020 but since that time she feels she gets SOB even  when talking for 5-10 minutes with her daughter. She feels the SOB has stayed the same since September 2020.  Denies any fever/chills but she says she sweats always. Appetite is good. No changes in weight. No N/V/D No urinary symptoms.   She feels tired but says the fatigue has gotten better as she does not need to sleep in the day as much as she used to before and takes a brief nap.    She is following up with Pulm and is also getting weekly IV immunoglobulins from her immunologist.   03/12/20 She has been having loose stool since January 8, which initially started as 3-4 episodes a day and was watery. However, it continued to get worse and started having 5-6 BMs a day. At  one point ( Jan 20th), she stopped taking all her meds including bactrim and she felt her diarrhea got somewhat better. She stopped it for 3 days and started taking PO Vancomycin around 1/18 which she has been taking until now. However, she felt the diarrhea came back right after she started taking all her meds including bactrim.    Denies any fevers, chills, sweats Denies nausea/vomiting.abdominal cramps/GU symptoms Denies recent travel, sick contact, outside food, family member having diarrhea, no new medications    In terms of cough, she feels its getting better, she has less mucous plugs. SOB has not completely gone away, but thinks there is some improvement. She does not seem to have SOB while talking to some one but if she talks for a longer time she feels SOB. She thinks she has lost  7 lbs since September 2021.     03/27/20 Beverly [name] is a 75 year old female with a history of mild persistent asthma, GERD, Bronchiectasis, OSA, Tracheomalacia, Primary immunodeficiency on immunoglobulin therapy, Rt reverse shoulder arthroplasty, skin malignancy here for follow up of Nocardia PNA and diarrhea. She has completed approx 5 months of bactrim therapy for Nocardia PNA. Bactrim was stopped last visit due to diarrhea that was  thought to be related to bactrim. C diff was negative.    She says her diarrhea got better since the bactrim was stopped and was doing well until this past Monday when she had few episodes of loose stool and started having solid stool from Tuesday. She says she is also going through some family issues and unsure if the diarrhea is related to it as it only lasted for 1 day. She also says that she saw some blood in the stool. She says last colonoscopy was 7 years ago. Denies any fevers, chills, sweats, abdominal cramps. Denies nausea, vomiting. Appetite is good. No new medicine or new food intake.    In terms of respiratory standpoint, she says cough is better 2-3 times a day. Phlegm is also less. SOB has been stable. She get SOB when talking for a long period of time.    05/29/20 Here for follow up of Nocardia pneumonia. She restarted bactrim on 3/10 after it was noticed that there was no improvement in her diarrhea after being off of bactrim. She has not noticed any changes in diarrhea since it started. Says every time she eats, she has 1-2 episodes of diarrhea if she does not take Imodium. She is taking 2 Imodium three times a day. She recently went to [place] for vacation on march 30 and came back a week ago. She was taken off of IVIG by Dr Neldon Mc and says she did not notice any improvement in the diarrhea. Stool is watery, foul smelling but no blood. Denies fevers, chils, nausea, vomiting and abdominal pain. She was also seen by GI and had colonoscopy done 4/8 results are pending. She says she has lost weight due to diarrhea.   In terms of respiratory symptoms, she is coughing less than before, produces mucous plugs which are less thick and less viscous. Phlegm is yellow in color Breathing has been the same. She mostly gets SOB when she talks but says she was able to walk without any difficulty when she recently went to [place]. She is able to do 30-45 minutes yard work at home. She is willing to trial  Lomotil to help with her diarrhea. I discussed side effects and need to be cautious with its use and preferably  take it before sleep.   08/06/20 Here for follow-up of pulmonary nocardia infection.  She took her last dose of Bactrim this past Sunday and will be starting to take again from today.  She feels a lot better than when seen in the clinic last visit.  Her diarrhea which was previously thought to be related to Bactrim was found out to be secondary to lymphocytic colitis and she has been on steroids on a tapering dose.  Diarrhea has completely resolved.  She is also getting weekly immunoglobulin infusions with allergy/immunology.  Cough has significantly improved and she only coughs 5-6 times a day.  Phlegm has also improved and she has minimal greenish phlegm if any.  Shortness of breath has been the same and she states she gets short of breath when talking for 5 to 10 minutes.  She overall feels well.  I discussed with her about continuing Bactrim and plan to treat for longer duration given her hypogammaglobulinemia/being on steroids now.  She agrees to the plan.  I will follow-up in 1 month.   08/28/20 Here for follow up for Pulmonary Nocardia infection Taking Bactrim 1ds tab twice daily. Denies missing any doses. Denies any concern for side effects. BMP done on 71/12 was wnl. She is following with Pulmonary as well as Allergist and receiving immunoglobulin injections. She is also on tapering dose budesonide for her lymphocytic colitis. Diarrhea is improved.  She recently had a resection for carcinoma of skin of her left leg and left leg is bandaged today.   Cough has significantly improved, she has whitish phlegm when she coughs up with occasionally brownish phlegm which she thinks is helping her clear her airways. Breathing has been the same. Denies any fevers , chills, sweats. Denies any nausea, vomiting and abdominal pain. Discussed with her to continue bactrim until the end of  year.  10/01/20 Here for follow up for Pulmonary Nocardia Infection. She has been following with GI and they stopped budesonide on 8/3. But she started having diarrhea with at least 10 episodes of watery BM soon after it was stopped. She has been started on Budesonide 3 mg since yesterday and has taken 2 doses so far, he diarrhea is resolved.  She tells me her breathing has been stable or somewhat improved. She is working with PT and able to do therapies for at least 45 minutes. Her cough was also improving but when she was taken off the budesonide briefly she started having cough( minimal dry cough ) when she turns during sleeping at night. Cough is dry with minimal phlegm and no big chunks of mucus pliugs as she used to have before. She is takiung Bactrim twice a day, denies any side effects with that. She is following up with Allerist for her immunoglobulin injections. She was told that she will need steroids and immunoglobulins injections  the rest of her life. No other complaints today.   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   right sholder   BCC (basal cell carcinoma of skin) 05/09/2014   mid lower back   BCC (basal cell carcinoma of skin) 05/03/2017   right low back   BCC (basal cell carcinoma  of skin) 05/03/2017   left upper back   BCC (basal cell carcinoma of skin) 07/05/2018   left mid back   BCC (basal cell carcinoma of skin) 05/20/1992   upper back   BCC (basal cell carcinoma of skin) 07/29/1993   left sholder medial   BCC (basal cell carcinoma of skin) 07/29/1993   left sholder lateral   BCC (basal cell carcinoma of skin) 07/29/1993   right thigh   BCC (basal cell carcinoma of skin) 07/29/1993   right  sholder   BCC (basal cell carcinoma of skin) 12/22/1994   right mid forearm   BCC (basal cell carcinoma of skin) 12/22/1994   right upper forearm   BCC (basal cell carcinoma of skin) 12/22/1994   lower right upper arm   BCC (basal cell carcinoma of skin) 12/22/1994   right upper arm sholder   BCC (basal cell carcinoma of skin) 08/11/1995   left leg below knee   BCC (basal cell carcinoma of skin) 04/11/2002   mid back   BCC (basal cell carcinoma of skin) 12/10/2002   right center upper back   BCC (basal cell carcinoma of skin) 05/26/2005   right post sholder   BCC (basal cell carcinoma) 05/09/2014   left inner shin   BCC (basal cell carcinoma) 06/12/2014   left forearm   Bowen's disease 10/07/1994   right post knee, right inner forearm/wrist   Bowen's disease 08/11/1995   left sholder   Bronchiectasis ([place])    Cataract    left   Chronic pain    Depression    Diverticulosis of colon 1998   mild   DJD (degenerative joint disease)    Duodenal ulcer    h/o   GERD (gastroesophageal reflux disease)    History of SCC (squamous cell carcinoma) of skin    Dr. Denna Haggard   History of sinus bradycardia    HLD (hyperlipidemia)    hypertriglyceridemia   Hypertensive retinopathy of both eyes 11/18/2017   IBS (irritable bowel syndrome)    Internal hemorrhoids 1998   Lymphocytic colitis    Mitral regurgitation    mild   Nocardiosis    Osteoarthritis    feet,shoulder,neck,back,hips and hands.   Pneumonia    PONV (postoperative nausea and vomiting)    Rotator cuff tear, right 02/2019   infraspinatus and supraspinatus, and dislocation of long head of bicep tendon (Dr. Alvan Dame)   SCC (squamous cell carcinoma) 11/26/2014   left hand, right hand, right deltoid   SCC (squamous cell carcinoma) 05/03/2017   left cheek   Sleep apnea    uses a mouth guard nightly   Squamous cell carcinoma in situ (SCCIS) 07/05/2018   left hand   Tracheobronchomalacia    Vitamin D deficiency    mild    Past Surgical History:  Procedure Laterality Date   ABDOMINAL HYSTERECTOMY     BLEPHAROPLASTY Bilateral 01/2018   BROW LIFT Bilateral 10/10/2018   Procedure: BROW LIFT;  Surgeon: Wallace Going, DO;  Location: [facility];  Service: Plastics;  Laterality: Bilateral;   BUNIONECTOMY     R 12/08, L 2004 (Dr. Janus Molder)   [facility]   bilateral   CATARACT EXTRACTION Bilateral Left in 08/2012, Right 04/2016   Dr.Hecker   CESAREAN SECTION     x2   CHOLECYSTECTOMY  1992   CLOSED REDUCTION NASAL FRACTURE N/A 08/29/2018   Procedure: CLOSED REDUCTION NASAL FRACTURE;  Surgeon: Wallace Going, DO;  Location: [facility];  Service: Plastics;  Laterality: N/A;  1 hour, please   COLONOSCOPY  2006   COLONOSCOPY  01/2012   due again 01/2022; mild diverticulosis   epidural steroid injection, back  02/2010   HIP SURGERY     right bursectomy x 3   HIP SURGERY Right 2000   torn cartilage, repaired   INGUINAL HERNIA REPAIR  9/09   bilat   NECK SURGERY  1989   c6-7 cervical laminectomy and diskecotmy   NECK SURGERY  1989   per patient posterior area of neck   REVERSE SHOULDER ARTHROPLASTY Right 04/11/2019   Procedure: REVERSE SHOULDER ARTHROPLASTY;  Surgeon: Nicholes Stairs, MD;  Location: [facility];  Service: Orthopedics;  Laterality: Right;  [facility]   SHOULDER SURGERY  2/05   left rotator cuff repair   SHOULDER SURGERY Left 03/14/2018   rotator cuff repair; Dr. Tonita Cong   TONSILLECTOMY  age 32   TOTAL ABDOMINAL HYSTERECTOMY W/ BILATERAL SALPINGOOPHORECTOMY  1997   fibroids   TROCHANTERIC BURSA EXCISION Right [place] EXTRACTION     Current Outpatient Medications on File Prior to Visit  Medication Sig Dispense Refill   acetaminophen (TYLENOL) 500 MG tablet Take 1,000 mg by mouth every 6 (six) hours as needed for moderate pain.      albuterol (PROVENTIL HFA;VENTOLIN HFA) 108 (90 Base) MCG/ACT  inhaler Inhale 2 puffs into the lungs every 6 (six) hours as needed for wheezing or shortness of breath. 18 g 1   aspirin EC 81 MG tablet Take 81 mg by mouth daily.     atorvastatin (LIPITOR) 40 MG tablet Take 1 tablet by mouth daily. 90 tablet 1   budesonide (ENTOCORT EC) 3 MG 24 hr capsule Take 3 capsules (9 mg total) by mouth daily. 90 capsule 0   Buprenorphine HCl (BELBUCA) 450 MCG FILM Place 1 film to inside of mouth every twelve hours 60 Film 2   Calcium Carbonate-Vitamin D 600-200 MG-UNIT TABS Take 1 tablet by mouth 2 (two) times daily.     celecoxib (CELEBREX) 200 MG capsule TAKE 1 CAPSULE BY MOUTH 2 TIMES DAILY 180 capsule 1   diclofenac Sodium (VOLTAREN) 1 % GEL Apply 2 g topically 4 (four) times daily as needed (joint pain).     esomeprazole (NEXIUM) 40 MG capsule TAKE 1 CAPSULE BY MOUTH DAILY IN THE MORNING 90 capsule 1   famotidine (PEPCID) 40 MG tablet TAKE 1 TABLET BY MOUTH AT BEDTIME. 90 tablet 1   gabapentin (NEURONTIN) 800 MG tablet TAKE 1 TABLET BY MOUTH ONCE DAILY AT BEDTIME 90 tablet 2   GUAIFENESIN ER PO Take 400 mg by mouth 2 (two) times daily.     HYDROcodone-acetaminophen (NORCO) 7.5-325 MG tablet Take 1 tablet by mouth every 12 (twelve) hours as needed for moderate pain. Dr Hardin Negus     Immune Globulin, Human,-klhw (XEMBIFY) 10 GM/50ML SOLN Xembify 10 gram/50 mL (20 %) subcutaneous solution  INJECT 10G SUBCUTANEOUSLY EVERY WEEK     ipratropium (ATROVENT) 0.03 % nasal spray Place 1 spray into both nostrils 3 (three) times daily. 30 mL 12   Melatonin-Pyridoxine (MELATIN PO)      methocarbamol (ROBAXIN) 500 MG tablet Take 1 tablet (500 mg total) by mouth 3 (three) times daily. 360 tablet 0   montelukast (SINGULAIR) 10 MG tablet TAKE 1 TABLET BY MOUTH AT BEDTIME. 60 tablet 0   Multiple Vitamins-Minerals (MULTIVITAMIN WITH MINERALS) tablet Take 1 tablet by  mouth daily.     omega-3 acid ethyl esters (LOVAZA) 1 g capsule TAKE 2 CAPSULES BY MOUTH 2 TIMES DAILY 180 capsule 1    ondansetron (ZOFRAN) 4 MG tablet TAKE 1 TABLET BY MOUTH EVERY 8 HOURS AS NEEDED FOR NAUSEA AND/OR VOMITING 60 tablet 2   PARoxetine (PAXIL) 30 MG tablet TAKE 2 TABLETS BY MOUTH ONCE A DAY (Patient taking differently: Take 30 mg by mouth daily.) 180 tablet 1   Respiratory Therapy Supplies (FLUTTER) DEVI 1 puff by Does not apply route 2 (two) times daily. 1 each 0   sulfamethoxazole-trimethoprim (BACTRIM DS) 800-160 MG tablet Take 1 tablet by mouth 2 (two) times daily. 180 tablet 1   triamcinolone (NASACORT) 55 MCG/ACT AERO nasal inhaler Place 2 sprays into the nose daily.     [DISCONTINUED] potassium chloride (KLOR-CON) 10 MEQ tablet Take 2 tablets (total 20 MEQ) by mouth once 2 tablet 0   No current facility-administered medications on file prior to visit.    Allergies  Allergen Reactions   Adhesive [Tape] Rash   Codeine Rash   Social History   Socioeconomic History   Marital status: Divorced    Spouse name: Not on file   Number of children: 2   Years of education: Not on file   Highest education level: Not on file  Occupational History   Occupation: STAFF NURSE    Employer: [employer]  Tobacco Use   Smoking status: Never   Smokeless tobacco: Never  Vaping Use   Vaping Use: Never used  Substance and Sexual Activity   Alcohol use: Yes    Alcohol/week: 7.0 standard drinks    Types: 7 Glasses of wine per week    Comment: 1 glass of wine daily   Drug use: No   Sexual activity: Not Currently  Other Topics Concern   Not on file  Social History Narrative   Divorced, lives alone, [place]; 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   [place] for [employer] part-time   Social Determinants of Health   Financial Resource Strain: Not on file  Food Insecurity: Not on file  Transportation Needs: Not on file  Physical Activity: Not on file   Stress: Not on file  Social Connections: Not on file  Intimate Partner Violence: Not on file    Vitals BP 131/81   Pulse 64   Temp 98.3 F (36.8 C) (Oral)   Wt 143 lb (64.9 kg)   SpO2 99%   BMI 23.80 kg/m    Examination  General - not in acute distress, comfortably sitting in chair HEENT - no pallor and no icterus Chest - b/l clear air entry, no additional sounds CVS- Normal 123456, systolic murmur Abdomen - Soft, Non tender , non distended Ext- no pedal edema Neuro: grossly normal Back - WNL Psych : calm and cooperative   Recent labs CBC Latest Ref Rng & Units 05/29/2020 05/06/2020 03/28/2020  WBC 3.8 - 10.8 Thousand/uL 4.6 5.2 4.3  Hemoglobin 11.7 - 15.5 g/dL 10.6(L) 11.2(L) 11.8  Hematocrit 35.0 - 45.0 % 33.6(L) 33.1(L) 35.9  Platelets 140 - 400 Thousand/uL 171 135(L) 123(L)   CMP Latest Ref Rng & Units 09/17/2020 08/27/2020 08/05/2020  Glucose 65 - 99 mg/dL 79 78 58(L)  BUN 7 - 25 mg/dL '16 18 21  '$ Creatinine 0.60 - 1.00 mg/dL 0.89 0.86 0.59(L)  Sodium 135 - 146 mmol/L 140  140 142  Potassium 3.5 - 5.3 mmol/L 4.7 4.3 4.3  Chloride 98 - 110 mmol/L 105 106 106  CO2 20 - 32 mmol/L 30 28 32  Calcium 8.6 - 10.4 mg/dL 8.9 9.1 8.8  Total Protein 6.1 - 8.1 g/dL - - -  Total Bilirubin 0.2 - 1.2 mg/dL - - -  Alkaline Phos 39 - 117 IU/L - - -  AST 10 - 35 U/L - - -  ALT 6 - 29 U/L - - -    Pertinent Microbiology Results for orders placed or performed in visit on 04/01/20  Ova and parasite examination     Status: None   Collection Time: 04/01/20 11:54 AM   Specimen: Stool  Result Value Ref Range Status   MICRO NUMBER: [id number]  Final   SPECIMEN QUALITY: Adequate  Final   Source STOOL  Final   STATUS: FINAL  Final   CONCENTRATE RESULT: No ova or parasites seen  Final   TRICHROME RESULT: No ova or parasites seen  Final   COMMENT:   Final    Routine Ova and Parasite exam may not detect some parasites that occasionally cause diarrheal illness. Cryptosporidium Antigen and/or  Cyclospora and Isospora Exam may be ordered to detect these parasites. One negative sample does not necessarily rule out  the presence of a parasitic infection.  For additional information, please refer to https://education.[facility].com/faq/FAQ203 (This link is being provided for informational/ educational purposes only.)   Fecal leukocytes     Status: None   Collection Time: 04/01/20 11:54 AM   Specimen: Stool  Result Value Ref Range Status   Micro Number [id number]  Final   Specimen Quality Adequate  Final   Source STOOL  Final   Status FINAL  Final   Result Not Detected  Final    Comment: Reference Range:Not Detected   Pertinent Imaging All pertinent labs/Imagings/notes reviewed. All pertinent plain films and CT images have been personally visualized and interpreted; radiology reports have been reviewed. Decision making incorporated into the Impression / Recommendations.  I have spent 35 minutes for this patient encounter including  review of prior medical records with greater than 50% of time in face to face counsel of the patient/discussing diagnostics and plan of care.   Electronically signed by:  Beverly Oz, MD Infectious Disease Physician [facility] [address] [place], [place] [address] Phone: [phone]  Fax: [phone]

## 2020-10-01 NOTE — Patient Instructions (Addendum)
  1.  Continue immunoglobulin infusions every week  2.  Continue to treat reflex / LPR:   A. Nexium 40 mg in AM  B. Famotidine 40 mg in PM IF NEEDED  3.  Treat and prevent inflammation of upper airway:   A.  Montelukast 10 mg -1 tablet 1 time per day  B.  OTC Nasacort -1 spray each nostril 1 time per day  4. If needed:  A. Loratadine 10 mg 1 tablet 1 time per day  B. Ipratropium 0.06% - 2 sprays each nostril every 6 hours (4x/day)  5.  Obtain fall flu vaccine  6.  Return to clinic in 6 months or earlier if problem  7. "Stress steroids"  8.  Bone density measurements

## 2020-10-01 NOTE — Progress Notes (Signed)
- [place] [facility]   Follow-up Note  Referring Provider: Isaac Bliss, [facility]* Primary Provider: Isaac Bliss, Rayford Halsted, MD Date of Office Visit: 10/01/2020  Subjective:   Beverly [name] (DOB: March 07, 1945) is a 75 y.o. female who returns to the [facility] on 10/01/2020 in re-evaluation of the following:  HPI: Aneela returns to this clinic in evaluation of CVID / hypogammaglobulinemia, bronchiectasis, history of Nocardia pneumonia, history of reflux induced respiratory disease, and history of chronic diarrhea secondary to lymphocytic colitis.  Her last visit to this clinic was 09 Jul 2020.  Overall she is really done well with her respiratory tract.  She has not had any infections.  Her cough has basically abated especially while she was using her oral budesonide for her lymphocytic colitis.  She did notice that she developed a little bit of cough when she tapered off her oral budesonide recently.  Her lymphocytic colitis associated with very bad diarrhea was under excellent control initially starting with 9 mg of oral budesonide per day and then tapering down to 3 mg of oral budesonide per day.  At 3 mg of oral budesonide per day she had no diarrhea and in fact had a little bit of constipation.  She discontinued her budesonide completely and her diarrhea returned within 4 to 5 days.  She is now back on budesonide currently at 9 mg/day as instructed by GI.  She has not had any reflux using a combination of Nexium and famotidine.  When I last saw her in this clinic she was having this low-grade continuous fatigue which resolved when she discontinued her Zyrtec and Chlor-Trimeton.  She is now using some loratadine.  She still has some postnasal drip in the back of her throat.  She was given nasal ipratropium by Dr. Shearon Stalls, pulmonary, but she is only using this in the morning.  She has received 4 COVID vaccinations.  Concerning  her history of Nocardia pneumonia, she will be continuing to use Bactrim for the remainder of this year as she did miss several months of Bactrim administration as she was working through her diarrhea issue.  Allergies as of 10/01/2020       Reactions   Adhesive [tape] Rash   Codeine Rash        Medication List    acetaminophen 500 MG tablet Commonly known as: TYLENOL Take 1,000 mg by mouth every 6 (six) hours as needed for moderate pain.   albuterol 108 (90 Base) MCG/ACT inhaler Commonly known as: VENTOLIN HFA Inhale 2 puffs into the lungs every 6 (six) hours as needed for wheezing or shortness of breath.   aspirin EC 81 MG tablet Take 81 mg by mouth daily.   atorvastatin 40 MG tablet Commonly known as: LIPITOR Take 1 tablet by mouth daily.   Belbuca 450 MCG Film Generic drug: Buprenorphine HCl Place 1 film to inside of mouth every twelve hours   budesonide 3 MG 24 hr capsule Commonly known as: ENTOCORT EC Take 3 capsules (9 mg total) by mouth daily.   Calcium Carbonate-Vitamin D 600-200 MG-UNIT Tabs Take 1 tablet by mouth 2 (two) times daily.   celecoxib 200 MG capsule Commonly known as: CELEBREX TAKE 1 CAPSULE BY MOUTH 2 TIMES DAILY   colestipol 1 g tablet Commonly known as: COLESTID Take 1/2-1 tablet  3 times per day What changed: additional instructions   diclofenac Sodium 1 % Gel Commonly known as: VOLTAREN Apply 2 g  topically 4 (four) times daily as needed (joint pain).   esomeprazole 40 MG capsule Commonly known as: NEXIUM TAKE 1 CAPSULE BY MOUTH DAILY IN THE MORNING   famotidine 40 MG tablet Commonly known as: PEPCID TAKE 1 TABLET BY MOUTH AT BEDTIME.   Flutter Devi 1 puff by Does not apply route 2 (two) times daily.   gabapentin 800 MG tablet Commonly known as: NEURONTIN TAKE 1 TABLET BY MOUTH ONCE DAILY AT BEDTIME   GUAIFENESIN ER PO Take 400 mg by mouth 2 (two) times daily.   HYDROcodone-acetaminophen 7.5-325 MG tablet Commonly known  as: NORCO Take 1 tablet by mouth every 12 (twelve) hours as needed for moderate pain. Dr Hardin Negus   ipratropium 0.03 % nasal spray Commonly known as: ATROVENT Place 1 spray into both nostrils 3 (three) times daily.   MELATIN PO   methocarbamol 500 MG tablet Commonly known as: ROBAXIN Take 1 tablet (500 mg total) by mouth 3 (three) times daily.   montelukast 10 MG tablet Commonly known as: SINGULAIR TAKE 1 TABLET BY MOUTH AT BEDTIME.   multivitamin with minerals tablet Take 1 tablet by mouth daily.   omega-3 acid ethyl esters 1 g capsule Commonly known as: LOVAZA TAKE 2 CAPSULES BY MOUTH 2 TIMES DAILY   ondansetron 4 MG tablet Commonly known as: ZOFRAN TAKE 1 TABLET BY MOUTH EVERY 8 HOURS AS NEEDED FOR NAUSEA AND/OR VOMITING   PARoxetine 30 MG tablet Commonly known as: PAXIL TAKE 2 TABLETS BY MOUTH ONCE A DAY What changed: how much to take   sulfamethoxazole-trimethoprim 800-160 MG tablet Commonly known as: BACTRIM DS Take 1 tablet by mouth 2 (two) times daily.   triamcinolone 55 MCG/ACT Aero nasal inhaler Commonly known as: NASACORT Place 2 sprays into the nose daily.   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   right sholder   BCC (basal cell carcinoma of skin) 05/09/2014   mid lower back   BCC (basal cell carcinoma of skin) 05/03/2017   right low back   BCC (basal cell carcinoma of skin) 05/03/2017   left upper back   BCC (basal cell carcinoma of skin) 07/05/2018   left mid back   BCC (basal cell carcinoma of skin) 05/20/1992   upper back   BCC (basal cell carcinoma of skin) 07/29/1993   left sholder  medial   BCC (basal cell carcinoma of skin) 07/29/1993   left sholder lateral   BCC (basal cell carcinoma of skin) 07/29/1993   right thigh   BCC (basal cell carcinoma of skin) 07/29/1993   right sholder   BCC (basal cell carcinoma of skin) 12/22/1994   right mid forearm   BCC (basal cell carcinoma of skin) 12/22/1994   right upper forearm   BCC (basal cell carcinoma of skin) 12/22/1994   lower right upper arm   BCC (basal cell carcinoma of skin) 12/22/1994   right upper arm sholder   BCC (basal cell carcinoma of skin) 08/11/1995   left leg below knee   BCC (basal cell carcinoma of skin) 04/11/2002   mid back  BCC (basal cell carcinoma of skin) 12/10/2002   right center upper back   BCC (basal cell carcinoma of skin) 05/26/2005   right post sholder   BCC (basal cell carcinoma) 05/09/2014   left inner shin   BCC (basal cell carcinoma) 06/12/2014   left forearm   Bowen's disease 10/07/1994   right post knee, right inner forearm/wrist   Bowen's disease 08/11/1995   left sholder   Bronchiectasis ([place])    Cataract    left   Chronic pain    Depression    Diverticulosis of colon 1998   mild   DJD (degenerative joint disease)    Duodenal ulcer    h/o   GERD (gastroesophageal reflux disease)    History of SCC (squamous cell carcinoma) of skin    Dr. Denna Haggard   History of sinus bradycardia    HLD (hyperlipidemia)    hypertriglyceridemia   Hypertensive retinopathy of both eyes 11/18/2017   IBS (irritable bowel syndrome)    Internal hemorrhoids 1998   Lymphocytic colitis    Mitral regurgitation    mild   Nocardiosis    Osteoarthritis    feet,shoulder,neck,back,hips and hands.   Pneumonia    PONV (postoperative nausea and vomiting)    Rotator cuff tear, right 02/2019   infraspinatus and supraspinatus, and dislocation of long head of bicep tendon (Dr. Alvan Dame)   SCC (squamous cell carcinoma) 11/26/2014   left hand, right hand, right deltoid   SCC (squamous cell carcinoma)  05/03/2017   left cheek   Sleep apnea    uses a mouth guard nightly   Squamous cell carcinoma in situ (SCCIS) 07/05/2018   left hand   Tracheobronchomalacia    Vitamin D deficiency    mild    Past Surgical History:  Procedure Laterality Date   ABDOMINAL HYSTERECTOMY     BLEPHAROPLASTY Bilateral 01/2018   BROW LIFT Bilateral 10/10/2018   Procedure: BROW LIFT;  Surgeon: Wallace Going, DO;  Location: [facility];  Service: Plastics;  Laterality: Bilateral;   BUNIONECTOMY     R 12/08, L 2004 (Dr. Janus Molder)   [place]   bilateral   CATARACT EXTRACTION Bilateral Left in 08/2012, Right 04/2016   Dr.Hecker   CESAREAN SECTION     x2   CHOLECYSTECTOMY  1992   CLOSED REDUCTION NASAL FRACTURE N/A 08/29/2018   Procedure: CLOSED REDUCTION NASAL FRACTURE;  Surgeon: Wallace Going, DO;  Location: [facility];  Service: Plastics;  Laterality: N/A;  1 hour, please   COLONOSCOPY  2006   COLONOSCOPY  01/2012   due again 01/2022; mild diverticulosis   epidural steroid injection, back  02/2010   HIP SURGERY     right bursectomy x 3   HIP SURGERY Right 2000   torn cartilage, repaired   INGUINAL HERNIA REPAIR  9/09   bilat   NECK SURGERY  1989   c6-7 cervical laminectomy and diskecotmy   NECK SURGERY  1989   per patient posterior area of neck   REVERSE SHOULDER ARTHROPLASTY Right 04/11/2019   Procedure: REVERSE SHOULDER ARTHROPLASTY;  Surgeon: Nicholes Stairs, MD;  Location: [facility];  Service: Orthopedics;  Laterality: Right;  [facility]   SHOULDER SURGERY  2/05   left rotator cuff repair   SHOULDER SURGERY Left 03/14/2018   rotator cuff repair; Dr. Tonita Cong   TONSILLECTOMY  age 47   TOTAL ABDOMINAL HYSTERECTOMY W/ BILATERAL SALPINGOOPHORECTOMY  1997   fibroids   TROCHANTERIC  BURSA EXCISION Right 1978   TUBAL LIGATION  1986   WISDOM TOOTH EXTRACTION      Review of systems negative except as noted in HPI / PMHx or noted  below:  Review of Systems  Constitutional: Negative.   HENT: Negative.    Eyes: Negative.   Respiratory: Negative.    Cardiovascular: Negative.   Gastrointestinal: Negative.   Genitourinary: Negative.   Musculoskeletal: Negative.   Skin: Negative.   Neurological: Negative.   Endo/Heme/Allergies: Negative.   Psychiatric/Behavioral: Negative.      Objective:   Vitals:   10/01/20 1040  BP: 138/72  Pulse: 65  Resp: 14  Temp: 98.5 F (36.9 C)  SpO2: 98%          Physical Exam Constitutional:      Appearance: She is not diaphoretic.  HENT:     Head: Normocephalic.     Right Ear: Tympanic membrane, ear canal and external ear normal.     Left Ear: Tympanic membrane, ear canal and external ear normal.     Nose: Nose normal. No mucosal edema or rhinorrhea.     Mouth/Throat:     Pharynx: Uvula midline. No oropharyngeal exudate.  Eyes:     Conjunctiva/sclera: Conjunctivae normal.  Neck:     Thyroid: No thyromegaly.     Trachea: Trachea normal. No tracheal tenderness or tracheal deviation.  Cardiovascular:     Rate and Rhythm: Normal rate and regular rhythm.     Heart sounds: Normal heart sounds, S1 normal and S2 normal. No murmur heard. Pulmonary:     Effort: No respiratory distress.     Breath sounds: Normal breath sounds. No stridor. No wheezing or rales.  Lymphadenopathy:     Head:     Right side of head: No tonsillar adenopathy.     Left side of head: No tonsillar adenopathy.     Cervical: No cervical adenopathy.  Skin:    Findings: No erythema or rash.     Nails: There is no clubbing.  Neurological:     Mental Status: She is alert.    Diagnostics:    Spirometry was performed and demonstrated an FEV1 of 2.37 at 103 % of predicted.  Results of a blood tests obtained 17 September 2020 identified serum IgG 948 mg/DL.  Assessment and Plan:   1. CVID (common variable immunodeficiency) ([place])   2. Bronchiectasis without complication (H[facility])   3. Lymphocytic  colitis   4. Nocardia infection   5. LPRD (laryngopharyngeal reflux disease)   6. Steroid dependence ([place])     1.  Continue immunoglobulin infusions every week  2.  Continue to treat reflex / LPR:   A. Nexium 40 mg in AM  B. Famotidine 40 mg in PM IF NEEDED  3.  Treat and prevent inflammation of upper airway:   A.  Montelukast 10 mg -1 tablet 1 time per day  B.  OTC Nasacort -1 spray each nostril 1 time per day  4. If needed:  A. Loratadine 10 mg 1 tablet 1 time per day  B. Ipratropium 0.06% - 2 sprays each nostril every 6 hours (4x/day)  5.  Obtain fall flu vaccine  6.  Return to clinic in 6 months or earlier if problem  7. "Stress steroids"  8.  Bone density measurements  Overall Beverly [name] appears to be doing quite well regarding her immunological dysfunction and her history of recurrent respiratory tract infections on her current plan of immunoglobulin infusions.  And her reflux appears  to be under very good control as is her LPR while using Nexium and famotidine and she has the option of discontinuing her famotidine.  She will continue on anti-inflammatory agents for her upper airway as noted above and she can introduce nasal ipratropium multiple times per day if needed.  Given her lymphocytic colitis that appears to be dependent on the use of oral budesonide to eliminate chronic diarrhea I suspect she will be on oral budesonide on a chronic basis and I did have a talk with her today about the need for stress steroids as her adrenal gland will not be able to respond to a significant stressor that may occur in the future and she should have bone density measurements performed every few years while using long-term systemic steroids.  Allena Katz, MD Allergy / Immunology [place]

## 2020-10-02 ENCOUNTER — Other Ambulatory Visit (HOSPITAL_COMMUNITY): Payer: Self-pay

## 2020-10-02 ENCOUNTER — Encounter: Payer: Self-pay | Admitting: Allergy and Immunology

## 2020-10-02 DIAGNOSIS — M545 Low back pain, unspecified: Secondary | ICD-10-CM | POA: Diagnosis not present

## 2020-10-02 MED ORDER — TRIAMCINOLONE ACETONIDE 55 MCG/ACT NA AERO
2.0000 | INHALATION_SPRAY | Freq: Every day | NASAL | 5 refills | Status: DC
  Filled 2020-10-02: qty 16.5, 30d supply, fill #0
  Filled 2020-10-02: qty 16.9, 25d supply, fill #0

## 2020-10-02 MED ORDER — FAMOTIDINE 40 MG PO TABS
ORAL_TABLET | Freq: Every day | ORAL | 2 refills | Status: DC
  Filled 2020-10-02: qty 90, fill #0

## 2020-10-02 MED ORDER — ESOMEPRAZOLE MAGNESIUM 40 MG PO CPDR
DELAYED_RELEASE_CAPSULE | Freq: Every morning | ORAL | 2 refills | Status: DC
  Filled 2020-10-02: qty 90, 90d supply, fill #0
  Filled 2021-01-19: qty 90, 90d supply, fill #1
  Filled 2021-01-21: qty 90, 90d supply, fill #2

## 2020-10-06 ENCOUNTER — Encounter: Payer: Self-pay | Admitting: Allergy and Immunology

## 2020-10-08 DIAGNOSIS — M25551 Pain in right hip: Secondary | ICD-10-CM | POA: Diagnosis not present

## 2020-10-08 DIAGNOSIS — M545 Low back pain, unspecified: Secondary | ICD-10-CM | POA: Diagnosis not present

## 2020-10-09 ENCOUNTER — Other Ambulatory Visit (HOSPITAL_COMMUNITY): Payer: Self-pay

## 2020-10-09 ENCOUNTER — Ambulatory Visit: Payer: Medicare PPO | Admitting: Dermatology

## 2020-10-09 ENCOUNTER — Encounter: Payer: Self-pay | Admitting: Dermatology

## 2020-10-09 ENCOUNTER — Other Ambulatory Visit: Payer: Self-pay

## 2020-10-09 DIAGNOSIS — L570 Actinic keratosis: Secondary | ICD-10-CM | POA: Diagnosis not present

## 2020-10-09 DIAGNOSIS — Z79891 Long term (current) use of opiate analgesic: Secondary | ICD-10-CM | POA: Diagnosis not present

## 2020-10-09 DIAGNOSIS — M150 Primary generalized (osteo)arthritis: Secondary | ICD-10-CM | POA: Diagnosis not present

## 2020-10-09 DIAGNOSIS — G894 Chronic pain syndrome: Secondary | ICD-10-CM | POA: Diagnosis not present

## 2020-10-09 DIAGNOSIS — M47816 Spondylosis without myelopathy or radiculopathy, lumbar region: Secondary | ICD-10-CM | POA: Diagnosis not present

## 2020-10-09 MED ORDER — HYDROCODONE-ACETAMINOPHEN 7.5-325 MG PO TABS
ORAL_TABLET | ORAL | 0 refills | Status: DC
  Filled 2020-10-09: qty 100, 25d supply, fill #0

## 2020-10-09 MED ORDER — BELBUCA 450 MCG BU FILM
ORAL_FILM | BUCCAL | 2 refills | Status: DC
  Filled 2020-11-26: qty 60, 30d supply, fill #0
  Filled 2020-12-25: qty 60, 30d supply, fill #1

## 2020-10-09 MED ORDER — IMIQUIMOD 5 % EX CREA
TOPICAL_CREAM | CUTANEOUS | 0 refills | Status: DC
  Filled 2020-10-09: qty 12, 28d supply, fill #0

## 2020-10-14 DIAGNOSIS — M545 Low back pain, unspecified: Secondary | ICD-10-CM | POA: Diagnosis not present

## 2020-10-15 ENCOUNTER — Encounter: Payer: Self-pay | Admitting: Gastroenterology

## 2020-10-15 ENCOUNTER — Other Ambulatory Visit: Payer: Self-pay

## 2020-10-15 ENCOUNTER — Ambulatory Visit: Payer: Medicare PPO | Admitting: Gastroenterology

## 2020-10-15 VITALS — BP 136/70 | HR 60 | Ht 65.0 in | Wt 148.0 lb

## 2020-10-15 DIAGNOSIS — K219 Gastro-esophageal reflux disease without esophagitis: Secondary | ICD-10-CM

## 2020-10-15 DIAGNOSIS — K52832 Lymphocytic colitis: Secondary | ICD-10-CM

## 2020-10-15 NOTE — Patient Instructions (Addendum)
If you are age 73 or older, your body mass index should be between 23-30. Your Body mass index is 24.63 kg/m. If this is out of the aforementioned range listed, please consider follow up with your Primary Care Provider.  If you are age 74 or younger, your body mass index should be between 19-25. Your Body mass index is 24.63 kg/m. If this is out of the aformentioned range listed, please consider follow up with your Primary Care Provider.   __________________________________________________________  The [facility] providers would like to encourage you to use [facility] to communicate with providers for non-urgent requests or questions.  Due to long hold times on the telephone, sending your provider a message by [facility] may be a faster and more efficient way to get a response.  Please allow 48 business hours for a response.  Please remember that this is for non-urgent requests.   Please take budesonide '3mg'$  daily for 1 month and then discontinue.  Please follow up in 6 months by calling to scheduling an office visit.  Please call with any questions or concerns.  It was a pleasure to see you today!  Vito Cirigliano, D.O.

## 2020-10-15 NOTE — Progress Notes (Signed)
Chief Complaint:    Lymphocytic Colitis, discuss medications  GI History: 75 year old female with a history of hypogammaglobulinemia (gets IVIG infusions), BCC, HLD, OSA, pulmonary nocardia infection (on chronic Bactrim), osteoarthritis, follows in the GI clinic for the following:  1) Lymphocytic Colitis - Stool studies negative for infection - Lymphocytic Colitis diagnosed on colonoscopy in 05/2020.  Started on budesonide 9 mg/day x8 weeks with taper with symptomatic improvement (constipation during course of therapy) - H2 RA stopped - Does take PPI along with an SSRI and Voltaren gel  2) GERD with LPR symptoms: Well-controlled with Nexium 40 mg/day. Discontinued Pepcid 40 mg nightly in 09/2020 due to Lymphocytic colitis and good control.    Endoscopic History: - Colonoscopy (05/2020): Benign rectal polyp, otherwise normal.  Biopsies with Lymphocytic Colitis.  Internal hemorrhoids.  Normal TI  HPI:     Patient is a 74 y.o. female presenting to the Gastroenterology Clinic for follow-up.  Was last seen in follow-up by Tye Savoy on 09/18/2020.  Had completed budesonide at that time.  Main issue was actually constipation with malodorous flatulence.  Was treated with MiraLAX and increased water intake.  Had discussed the possibility of SIBO, and interestingly takes chronic Bactrim for pulmonary nocardia infection.  Also stopped taking Colestid.  Started Colace but then developed diarrhea again requiring Imodium.  Subsequently stopped taking any medications for diarrhea/constipation.  Stools had normalized for a couple days, then diarrhea restarted again around 09/29/2020.  Was given another Rx for budesonide 9 mg/day.  Today, she states she actually only started back at 3 mg/day with excellent response.  Symptoms well controlled.  Does take OTC simethicone prn for gas relief.  Has since topped Pepcid.  Reflux still well controlled with Nexium 40 mg/day.  No new imaging for review.  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   right sholder   BCC (basal cell carcinoma of skin) 05/09/2014   mid lower back   BCC (basal cell carcinoma of skin) 05/03/2017   right low back   BCC (basal cell carcinoma of skin) 05/03/2017   left upper back   BCC (basal cell carcinoma of skin) 07/05/2018   left mid back   BCC (basal cell carcinoma of skin) 05/20/1992   upper back   BCC (basal cell carcinoma of skin) 07/29/1993   left sholder medial   BCC (basal cell carcinoma of skin) 07/29/1993   left sholder lateral   BCC (basal cell carcinoma of skin) 07/29/1993   right thigh   BCC (basal cell carcinoma of skin) 07/29/1993   right sholder   BCC (basal cell carcinoma of skin) 12/22/1994   right mid forearm   BCC (basal cell carcinoma of skin) 12/22/1994   right upper forearm   BCC (basal cell carcinoma of skin) 12/22/1994   lower right upper arm   BCC (basal cell carcinoma of skin) 12/22/1994   right upper arm sholder   BCC (basal cell carcinoma of skin) 08/11/1995   left leg below knee   BCC (basal cell carcinoma of skin) 04/11/2002   mid back   BCC (basal cell carcinoma of skin) 12/10/2002   right  center upper back   BCC (basal cell carcinoma of skin) 05/26/2005   right post sholder   BCC (basal cell carcinoma) 05/09/2014   left inner shin   BCC (basal cell carcinoma) 06/12/2014   left forearm   Bowen's disease 10/07/1994   right post knee, right inner forearm/wrist   Bowen's disease 08/11/1995   left sholder   Bronchiectasis ([place])    Cataract    left   Chronic pain    Depression    Diverticulosis of colon 1998   mild   DJD (degenerative joint disease)    Duodenal ulcer    h/o   GERD (gastroesophageal  reflux disease)    History of SCC (squamous cell carcinoma) of skin    Dr. Denna Haggard   History of sinus bradycardia    HLD (hyperlipidemia)    hypertriglyceridemia   Hypertensive retinopathy of both eyes 11/18/2017   IBS (irritable bowel syndrome)    Internal hemorrhoids 1998   Lymphocytic colitis    Mitral regurgitation    mild   Nocardiosis    Osteoarthritis    feet,shoulder,neck,back,hips and hands.   Pneumonia    PONV (postoperative nausea and vomiting)    Rotator cuff tear, right 02/2019   infraspinatus and supraspinatus, and dislocation of long head of bicep tendon (Dr. Alvan Dame)   SCC (squamous cell carcinoma) 11/26/2014   left hand, right hand, right deltoid   SCC (squamous cell carcinoma) 05/03/2017   left cheek   Sleep apnea    uses a mouth guard nightly   Squamous cell carcinoma in situ (SCCIS) 07/05/2018   left hand   Tracheobronchomalacia    Vitamin D deficiency    mild    Patient's surgical history, family medical history, social history, medications and allergies were all reviewed in Epic    Current Outpatient Medications  Medication Sig Dispense Refill   acetaminophen (TYLENOL) 500 MG tablet Take 1,000 mg by mouth every 6 (six) hours as needed for moderate pain.      albuterol (PROVENTIL HFA;VENTOLIN HFA) 108 (90 Base) MCG/ACT inhaler Inhale 2 puffs into the lungs every 6 (six) hours as needed for wheezing or shortness of breath. 18 g 1   aspirin EC 81 MG tablet Take 81 mg by mouth daily.     atorvastatin (LIPITOR) 40 MG tablet Take 1 tablet by mouth daily. 90 tablet 1   Buprenorphine HCl (BELBUCA) 450 MCG FILM Apply 1 film to inside of mouth every twelve hours 60 Film 2   Calcium Carbonate-Vitamin D 600-200 MG-UNIT TABS Take 1 tablet by mouth 2 (two) times daily.     celecoxib (CELEBREX) 200 MG capsule TAKE 1 CAPSULE BY MOUTH 2 TIMES DAILY 180 capsule 1   diclofenac Sodium (VOLTAREN) 1 % GEL Apply 2 g topically 4 (four) times daily as needed (joint pain).      Docusate Sodium 100 MG capsule 1 capsule daily     esomeprazole (NEXIUM) 40 MG capsule TAKE 1 CAPSULE BY MOUTH DAILY IN THE MORNING 90 capsule 2   gabapentin (NEURONTIN) 800 MG tablet TAKE 1 TABLET BY MOUTH ONCE DAILY AT BEDTIME 90 tablet 2   GUAIFENESIN ER PO Take 400 mg by mouth 2 (two) times daily.     HYDROcodone-acetaminophen (NORCO) 7.5-325 MG tablet Take 1 tablet by mouth four times a day as needed for pain 100 tablet 0   imiquimod (ALDARA) 5 % cream Apply to affected areas on Monday,Wednesday, and Friday as directed for 6 weeks- 8 weeks 12 each  0   Immune Globulin, Human,-klhw (XEMBIFY) 10 GM/50ML SOLN Xembify 10 gram/50 mL (20 %) subcutaneous solution  INJECT 10G SUBCUTANEOUSLY EVERY WEEK     ipratropium (ATROVENT) 0.03 % nasal spray Place 1 spray into both nostrils 3 (three) times daily. 30 mL 12   Melatonin-Pyridoxine (MELATIN PO)      methocarbamol (ROBAXIN) 500 MG tablet Take 1 tablet (500 mg total) by mouth 3 (three) times daily. 360 tablet 0   Misc Natural Products (FIBER 7 PO) 2 gummies daily     montelukast (SINGULAIR) 10 MG tablet TAKE 1 TABLET BY MOUTH AT BEDTIME. 60 tablet 0   Multiple Vitamins-Minerals (MULTIVITAMIN WITH MINERALS) tablet Take 1 tablet by mouth daily.     omega-3 acid ethyl esters (LOVAZA) 1 g capsule TAKE 2 CAPSULES BY MOUTH 2 TIMES DAILY 180 capsule 1   ondansetron (ZOFRAN) 4 MG tablet TAKE 1 TABLET BY MOUTH EVERY 8 HOURS AS NEEDED FOR NAUSEA AND/OR VOMITING 60 tablet 2   PARoxetine (PAXIL) 30 MG tablet TAKE 2 TABLETS BY MOUTH ONCE A DAY (Patient taking differently: Take 30 mg by mouth daily.) 180 tablet 1   Respiratory Therapy Supplies (FLUTTER) DEVI 1 puff by Does not apply route 2 (two) times daily. 1 each 0   sulfamethoxazole-trimethoprim (BACTRIM DS) 800-160 MG tablet Take 1 tablet by mouth 2 (two) times daily. 180 tablet 1   budesonide (ENTOCORT EC) 3 MG 24 hr capsule Take 3 capsules (9 mg total) by mouth daily. 90 capsule 0   Buprenorphine HCl  (BELBUCA) 450 MCG FILM Place 1 film to inside of mouth every twelve hours 60 Film 2   HYDROcodone-acetaminophen (NORCO) 7.5-325 MG tablet Take 1 tablet by mouth every 12 (twelve) hours as needed for moderate pain. Dr Hardin Negus     triamcinolone (NASACORT) 55 MCG/ACT AERO nasal inhaler Place 2 sprays into each nostril once daily. 16.5 g 5   No current facility-administered medications for this visit.    Physical Exam:     BP 136/70   Pulse 60   Ht '5\' 5"'$  (1.651 m)   Wt 148 lb (67.1 kg)   SpO2 97%   BMI 24.63 kg/m   GENERAL:  Pleasant female in NAD PSYCH: : Cooperative, normal affect Musculoskeletal:  Normal muscle tone, normal strength NEURO: Alert and oriented x 3, no focal neurologic deficits   IMPRESSION and PLAN:    1) Lymphocytic Colitis - Get good control of symptoms since restarting budesonide 3 mg/day - Continue budesonide 3 mg/day for the next 4 weeks, then discontinue - If symptoms recur, will go back to low-dose 3 mg/day x3-4 months for long course - Did discuss alternative diagnoses, to include overlap with underlying hypogammaglobulinemia.  However, with good clinical response to a clear diagnosis, no plan for additional EGD, labs, imaging, etc. at this juncture  2) GERD - Well-controlled with Nexium 40 mg/day - Continue antireflux lifestyle/dietary modifications  RTC in 6 months or sooner as needed      Lavena Bullion ,DO, FACG 10/15/2020, 10:04 AM

## 2020-10-17 DIAGNOSIS — M545 Low back pain, unspecified: Secondary | ICD-10-CM | POA: Diagnosis not present

## 2020-10-20 ENCOUNTER — Encounter: Payer: Self-pay | Admitting: Dermatology

## 2020-10-20 NOTE — Progress Notes (Signed)
   Follow-Up Visit   Subjective  Beverly [name] is a 75 y.o. female who presents for the following: Skin Problem (Spots on lower legs- + itch x months- tx- tolak applied x 28 application- last application was 3 weeks ago).  Crusts on legs, did not clear with topical [place] Location:  Duration:  Quality:  Associated Signs/Symptoms: Modifying Factors:  Severity:  Timing: Context:   Objective  Well appearing patient in no apparent distress; mood and affect are within normal limits. Left Lower Leg - Anterior, Right Lower Leg - Anterior Clinically these 5 to 12 mm pink crusts are mixture of inflamed keratoses, DSAP, and possible superficial carcinomas.  This nurse well understands the challenge of having surgical sites on lower legs he will and is amenable to trying 1 more topical therapy.    A focused examination was performed including face, arms, legs. Relevant physical exam findings are noted in the Assessment and Plan.   Assessment & Plan    AK (actinic keratosis) (2) Left Lower Leg - Anterior; Right Lower Leg - Anterior  F/u 1 month after using Imiquimod cream: Will apply 3-5 nights weekly for total of [id number] applications or until she sees brisk inflammation.  imiquimod (ALDARA) 5 % cream - Left Lower Leg - Anterior, Right Lower Leg - Anterior Apply to affected areas on Monday,Wednesday, and Friday as directed for 6 weeks- 8 weeks      I, Lavonna Monarch, MD, have reviewed all documentation for this visit.  The documentation on 10/20/20 for the exam, diagnosis, procedures, and orders are all accurate and complete.

## 2020-10-22 DIAGNOSIS — M25551 Pain in right hip: Secondary | ICD-10-CM | POA: Diagnosis not present

## 2020-10-22 DIAGNOSIS — M545 Low back pain, unspecified: Secondary | ICD-10-CM | POA: Diagnosis not present

## 2020-10-22 NOTE — Addendum Note (Signed)
Addended by: Lavonna Monarch on: 10/22/2020 06:57 PM   Modules accepted: Level of Service

## 2020-10-23 DIAGNOSIS — F3341 Major depressive disorder, recurrent, in partial remission: Secondary | ICD-10-CM | POA: Diagnosis not present

## 2020-10-23 DIAGNOSIS — K5900 Constipation, unspecified: Secondary | ICD-10-CM | POA: Diagnosis not present

## 2020-10-23 DIAGNOSIS — G8929 Other chronic pain: Secondary | ICD-10-CM | POA: Diagnosis not present

## 2020-10-23 DIAGNOSIS — K219 Gastro-esophageal reflux disease without esophagitis: Secondary | ICD-10-CM | POA: Diagnosis not present

## 2020-10-23 DIAGNOSIS — A439 Nocardiosis, unspecified: Secondary | ICD-10-CM | POA: Diagnosis not present

## 2020-10-23 DIAGNOSIS — E663 Overweight: Secondary | ICD-10-CM | POA: Diagnosis not present

## 2020-10-23 DIAGNOSIS — M1990 Unspecified osteoarthritis, unspecified site: Secondary | ICD-10-CM | POA: Diagnosis not present

## 2020-10-23 DIAGNOSIS — E785 Hyperlipidemia, unspecified: Secondary | ICD-10-CM | POA: Diagnosis not present

## 2020-10-23 DIAGNOSIS — J45909 Unspecified asthma, uncomplicated: Secondary | ICD-10-CM | POA: Diagnosis not present

## 2020-10-23 NOTE — Telephone Encounter (Signed)
Possible etiology for the rash, purpleish mucous could be hemorrhoidal (did have internal hemorrhoids on recent colonoscopy), mild mucosal sloughing from the duration of her diarrhea, or possibly even diet/food related.  Given the description of symptoms and recency of her colonoscopy, conservative management seems most appropriate.  If symptoms continue to recur, could consider further treatment of hemorrhoids with band ligation vs labs (i.e. ESR, CRP, fecal calprotectin) as appropriate.

## 2020-10-25 ENCOUNTER — Other Ambulatory Visit (HOSPITAL_COMMUNITY): Payer: Self-pay

## 2020-10-27 ENCOUNTER — Other Ambulatory Visit: Payer: Self-pay | Admitting: Internal Medicine

## 2020-10-27 DIAGNOSIS — M159 Polyosteoarthritis, unspecified: Secondary | ICD-10-CM

## 2020-10-28 ENCOUNTER — Other Ambulatory Visit (HOSPITAL_COMMUNITY): Payer: Self-pay

## 2020-10-28 NOTE — Telephone Encounter (Signed)
Received the following message from patient:   "I have recently been encouraged to look into having my crawlspace air, etc totally cut off from the interior air of my home. My home was built in 1968 and apparently has dry, dusty earth and actually dead rodents, etc under my home. The encapsulation process is the only way to prevent the contaminated air from the crawlspace from entering the interior air.  During the process the entire crawlspace is cleaned, cut off by using heavy duty plastic to cover the foundation walls/floor and a dehumidifier/filtration system added. As I plan on living in my home for as long as possible I would appreciate having your comments about going forward with this plan considering my pulmonary health problems.  Due to the cost of this project I may even approach my health insurance company, [employer], to let them know that I am being proactive about my health issues.  I truly appreciate your taking the time to respond.  Beverly [name] "  Dr. Shearon Stalls, can you please advise? Thanks!

## 2020-10-29 ENCOUNTER — Other Ambulatory Visit (HOSPITAL_COMMUNITY): Payer: Self-pay

## 2020-10-29 MED ORDER — CELECOXIB 200 MG PO CAPS
ORAL_CAPSULE | Freq: Two times a day (BID) | ORAL | 1 refills | Status: DC
  Filled 2020-10-29: qty 180, 90d supply, fill #0
  Filled 2021-01-21: qty 180, 90d supply, fill #1

## 2020-11-01 ENCOUNTER — Telehealth: Payer: Self-pay | Admitting: Internal Medicine

## 2020-11-01 DIAGNOSIS — J479 Bronchiectasis, uncomplicated: Secondary | ICD-10-CM

## 2020-11-01 NOTE — Telephone Encounter (Signed)
Patient is returning phone call. Patient phone number is [phone].

## 2020-11-01 NOTE — Telephone Encounter (Signed)
Spoke with the pt  She is asking if Dr Shearon Stalls if she needs to provide [place] with sputum sample  She is coughing up more sputum x 2 wks- light yellow  Still on Septra until end of the year per [facility]  I advised needs to call [facility] and ask them about this, and she has called them today but no answer yet  She wants to know what Dr Shearon Stalls thinks  I advised keep using flutter valve, will send msg to Dr Shearon Stalls to advise recs, thanks

## 2020-11-01 NOTE — Telephone Encounter (Signed)
I have called and LM on VM for the pt to call us back.  

## 2020-11-03 ENCOUNTER — Encounter: Payer: Self-pay | Admitting: Internal Medicine

## 2020-11-03 ENCOUNTER — Encounter (INDEPENDENT_AMBULATORY_CARE_PROVIDER_SITE_OTHER): Payer: Self-pay

## 2020-11-03 MED FILL — Gabapentin Tab 800 MG: ORAL | 90 days supply | Qty: 90 | Fill #1 | Status: AC

## 2020-11-04 ENCOUNTER — Other Ambulatory Visit (HOSPITAL_COMMUNITY): Payer: Self-pay

## 2020-11-04 ENCOUNTER — Ambulatory Visit: Payer: Medicare PPO

## 2020-11-05 ENCOUNTER — Other Ambulatory Visit: Payer: Self-pay

## 2020-11-06 ENCOUNTER — Encounter: Payer: Self-pay | Admitting: Internal Medicine

## 2020-11-06 ENCOUNTER — Ambulatory Visit: Payer: Medicare PPO

## 2020-11-06 ENCOUNTER — Ambulatory Visit: Payer: Medicare PPO | Admitting: Internal Medicine

## 2020-11-06 ENCOUNTER — Other Ambulatory Visit: Payer: Self-pay

## 2020-11-06 VITALS — Temp 98.1°F | Wt 147.6 lb

## 2020-11-06 DIAGNOSIS — M85852 Other specified disorders of bone density and structure, left thigh: Secondary | ICD-10-CM

## 2020-11-06 DIAGNOSIS — R42 Dizziness and giddiness: Secondary | ICD-10-CM | POA: Diagnosis not present

## 2020-11-06 NOTE — Telephone Encounter (Signed)
Yes. [facility] sputum cx order placed, she can drop off   I spoke with the pt and notified of response per Dr Shearon Stalls  She verbalized understanding  Will pick up sputum cup at [facility] since she has appt there and drop off sample to our lab  Nothing further needed

## 2020-11-06 NOTE — Progress Notes (Signed)
Acute office Visit     This visit occurred during the SARS-CoV-2 public health emergency.  Safety protocols were in place, including screening questions prior to the visit, additional usage of staff PPE, and extensive cleaning of exam room while observing appropriate contact time as indicated for disinfecting solutions.    CC/Reason for Visit: Dizziness, requesting DEXA scan  HPI: Beverly [name] is a 75 y.o. female who is coming in today for the above mentioned reasons. Past Medical History is significant for: Obstructive sleep apnea, hyperlipidemia, chronic pain syndrome, asthma, GERD, she also is a history of a pulmonary nocardia infection and is on Bactrim till the end of the year followed by infectious diseases, history of hypogammaglobulinemia on weekly IVIG injections directed by allergy, she also was recently diagnosed with lymphocytic colitis and is on budesonide followed by Dr. Bryan Lemma.  For the past year or so she has been noted episodes of dizziness when standing up all of a sudden like when she is in her recliner and somebody rings the doorbell.  She has not thought to take her blood pressure during these instances.  She denies chest pain, syncopal episodes, shortness of breath with exertion.  She also has a lot of postnasal drip and possibly sinus headaches.  She uses Singulair and nasal decongestant spray, Mucinex, has never tried an antihistamine.  She is also requesting a DEXA scan.  She has an appointment next week for annual physical.   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   right sholder   BCC (basal cell carcinoma of skin) 05/09/2014   mid lower back   BCC (basal cell carcinoma of skin) 05/03/2017   right low back   BCC (basal cell  carcinoma of skin) 05/03/2017   left upper back   BCC (basal cell carcinoma of skin) 07/05/2018   left mid back   BCC (basal cell carcinoma of skin) 05/20/1992   upper back   BCC (basal cell carcinoma of skin) 07/29/1993   left sholder medial   BCC (basal cell carcinoma of skin) 07/29/1993   left sholder lateral   BCC (basal cell carcinoma of skin) 07/29/1993   right thigh   BCC (basal cell carcinoma of skin) 07/29/1993   right sholder   BCC (basal cell carcinoma of skin) 12/22/1994   right mid forearm   BCC (basal cell carcinoma of skin) 12/22/1994   right upper forearm   BCC (basal cell carcinoma of skin) 12/22/1994   lower right upper arm   BCC (basal cell carcinoma of skin) 12/22/1994   right upper arm sholder   BCC (basal cell carcinoma of skin) 08/11/1995   left leg below knee   BCC (basal cell carcinoma of skin) 04/11/2002   mid back   BCC (basal cell carcinoma of skin) 12/10/2002   right center upper back   BCC (basal cell carcinoma of skin) 05/26/2005   right post sholder   BCC (basal cell carcinoma) 05/09/2014   left inner shin   BCC (basal cell carcinoma) 06/12/2014   left forearm   Bowen's disease 10/07/1994   right post knee, right inner forearm/wrist   Bowen's disease 08/11/1995   left sholder   Bronchiectasis ([place])    Cataract  left   Chronic pain    Depression    Diverticulosis of colon 1998   mild   DJD (degenerative joint disease)    Duodenal ulcer    h/o   GERD (gastroesophageal reflux disease)    History of SCC (squamous cell carcinoma) of skin    Dr. Denna Haggard   History of sinus bradycardia    HLD (hyperlipidemia)    hypertriglyceridemia   Hypertensive retinopathy of both eyes 11/18/2017   IBS (irritable bowel syndrome)    Internal hemorrhoids 1998   Lymphocytic colitis    Mitral regurgitation    mild   Nocardiosis    Osteoarthritis    feet,shoulder,neck,back,hips and hands.   Pneumonia    PONV (postoperative nausea and vomiting)     Rotator cuff tear, right 02/2019   infraspinatus and supraspinatus, and dislocation of long head of bicep tendon (Dr. Alvan Dame)   SCC (squamous cell carcinoma) 11/26/2014   left hand, right hand, right deltoid   SCC (squamous cell carcinoma) 05/03/2017   left cheek   Sleep apnea    uses a mouth guard nightly   Squamous cell carcinoma in situ (SCCIS) 07/05/2018   left hand   Tracheobronchomalacia    Vitamin D deficiency    mild    Past Surgical History:  Procedure Laterality Date   ABDOMINAL HYSTERECTOMY     BLEPHAROPLASTY Bilateral 01/2018   BROW LIFT Bilateral 10/10/2018   Procedure: BROW LIFT;  Surgeon: Wallace Going, DO;  Location: [facility];  Service: Plastics;  Laterality: Bilateral;   BUNIONECTOMY     R 12/08, L 2004 (Dr. Janus Molder)   [place]   bilateral   CATARACT EXTRACTION Bilateral Left in 08/2012, Right 04/2016   Dr.Hecker   CESAREAN SECTION     x2   CHOLECYSTECTOMY  1992   CLOSED REDUCTION NASAL FRACTURE N/A 08/29/2018   Procedure: CLOSED REDUCTION NASAL FRACTURE;  Surgeon: Wallace Going, DO;  Location: [facility];  Service: Plastics;  Laterality: N/A;  1 hour, please   COLONOSCOPY  2006   COLONOSCOPY  01/2012   due again 01/2022; mild diverticulosis   epidural steroid injection, back  02/2010   HIP SURGERY     right bursectomy x 3   HIP SURGERY Right 2000   torn cartilage, repaired   INGUINAL HERNIA REPAIR  9/09   bilat   NECK SURGERY  1989   c6-7 cervical laminectomy and diskecotmy   NECK SURGERY  1989   per patient posterior area of neck   REVERSE SHOULDER ARTHROPLASTY Right 04/11/2019   Procedure: REVERSE SHOULDER ARTHROPLASTY;  Surgeon: Nicholes Stairs, MD;  Location: [facility];  Service: Orthopedics;  Laterality: Right;  Regional Block   SHOULDER SURGERY  2/05   left rotator cuff repair   SHOULDER SURGERY Left 03/14/2018   rotator cuff repair; Dr. Tonita Cong   TONSILLECTOMY  age 69   TOTAL  ABDOMINAL HYSTERECTOMY W/ BILATERAL SALPINGOOPHORECTOMY  1997   fibroids   TROCHANTERIC BURSA EXCISION Right 1978   TUBAL LIGATION  1986   WISDOM TOOTH EXTRACTION      Social History:  reports that she has never smoked. She has never used smokeless tobacco. She reports current alcohol use of about 7.0 standard drinks per week. She reports that she does not use drugs.  Allergies: Allergies  Allergen Reactions   Adhesive [Tape] Rash   Codeine Rash    Family History:  Family History  Problem Relation Age  of Onset   Depression Mother    Schizophrenia Mother    Cancer Father        oral   Heart disease Father        bradycardia   Esophageal cancer Father    Cancer Sister        skin and lung   COPD Sister    Hypertension Sister    Osteoporosis Sister        compression fx's x 3 11/2018   Hyperthyroidism Brother    Cancer Brother        metastatic cancer to bone (?primary)   Hyperlipidemia Daughter    Asthma Son    Cancer Paternal Grandmother 53       colon cancer, metastatic to liver   Colon cancer Paternal Grandmother    Liver cancer Paternal Grandmother    Diabetes Maternal Grandmother    ADD / ADHD Other      Current Outpatient Medications:    acetaminophen (TYLENOL) 500 MG tablet, Take 1,000 mg by mouth every 6 (six) hours as needed for moderate pain. , Disp: , Rfl:    albuterol (PROVENTIL HFA;VENTOLIN HFA) 108 (90 Base) MCG/ACT inhaler, Inhale 2 puffs into the lungs every 6 (six) hours as needed for wheezing or shortness of breath., Disp: 18 g, Rfl: 1   aspirin EC 81 MG tablet, Take 81 mg by mouth daily., Disp: , Rfl:    atorvastatin (LIPITOR) 40 MG tablet, Take 1 tablet by mouth daily., Disp: 90 tablet, Rfl: 1   Buprenorphine HCl (BELBUCA) 450 MCG FILM, Apply 1 film to inside of mouth every twelve hours, Disp: 60 Film, Rfl: 2   Calcium Carbonate-Vitamin D 600-200 MG-UNIT TABS, Take 1 tablet by mouth 2 (two) times daily., Disp: , Rfl:    celecoxib (CELEBREX) 200  MG capsule, TAKE 1 CAPSULE BY MOUTH 2 TIMES DAILY, Disp: 180 capsule, Rfl: 1   diclofenac Sodium (VOLTAREN) 1 % GEL, Apply 2 g topically 4 (four) times daily as needed (joint pain)., Disp: , Rfl:    Docusate Sodium 100 MG capsule, 1 capsule daily, Disp: , Rfl:    esomeprazole (NEXIUM) 40 MG capsule, TAKE 1 CAPSULE BY MOUTH DAILY IN THE MORNING, Disp: 90 capsule, Rfl: 2   gabapentin (NEURONTIN) 800 MG tablet, TAKE 1 TABLET BY MOUTH ONCE DAILY AT BEDTIME, Disp: 90 tablet, Rfl: 2   GUAIFENESIN ER PO, Take 400 mg by mouth 2 (two) times daily., Disp: , Rfl:    HYDROcodone-acetaminophen (NORCO) 7.5-325 MG tablet, Take 1 tablet by mouth four times a day as needed for pain, Disp: 100 tablet, Rfl: 0   imiquimod (ALDARA) 5 % cream, Apply to affected areas on Monday,Wednesday, and Friday as directed for 6 weeks- 8 weeks, Disp: 12 each, Rfl: 0   Immune Globulin, Human,-klhw (XEMBIFY) 10 GM/50ML SOLN, Xembify 10 gram/50 mL (20 %) subcutaneous solution  INJECT 10G SUBCUTANEOUSLY EVERY WEEK, Disp: , Rfl:    ipratropium (ATROVENT) 0.03 % nasal spray, Place 1 spray into both nostrils 3 (three) times daily., Disp: 30 mL, Rfl: 12   Melatonin-Pyridoxine (MELATIN PO), , Disp: , Rfl:    methocarbamol (ROBAXIN) 500 MG tablet, Take 1 tablet (500 mg total) by mouth 3 (three) times daily., Disp: 360 tablet, Rfl: 0   Misc Natural Products (FIBER 7 PO), 2 gummies daily, Disp: , Rfl:    montelukast (SINGULAIR) 10 MG tablet, TAKE 1 TABLET BY MOUTH AT BEDTIME., Disp: 60 tablet, Rfl: 0   Multiple Vitamins-Minerals (MULTIVITAMIN WITH  MINERALS) tablet, Take 1 tablet by mouth daily., Disp: , Rfl:    omega-3 acid ethyl esters (LOVAZA) 1 g capsule, TAKE 2 CAPSULES BY MOUTH 2 TIMES DAILY, Disp: 180 capsule, Rfl: 1   ondansetron (ZOFRAN) 4 MG tablet, TAKE 1 TABLET BY MOUTH EVERY 8 HOURS AS NEEDED FOR NAUSEA AND/OR VOMITING, Disp: 60 tablet, Rfl: 2   PARoxetine (PAXIL) 30 MG tablet, TAKE 2 TABLETS BY MOUTH ONCE A DAY (Patient taking  differently: Take 30 mg by mouth daily.), Disp: 180 tablet, Rfl: 1   Respiratory Therapy Supplies (FLUTTER) DEVI, 1 puff by Does not apply route 2 (two) times daily., Disp: 1 each, Rfl: 0   sulfamethoxazole-trimethoprim (BACTRIM DS) 800-160 MG tablet, Take 1 tablet by mouth 2 (two) times daily., Disp: 180 tablet, Rfl: 1   triamcinolone (NASACORT) 55 MCG/ACT AERO nasal inhaler, Place 2 sprays into each nostril once daily., Disp: 16.5 g, Rfl: 5  Review of Systems:  Constitutional: Denies fever, chills, diaphoresis, appetite change and fatigue.  HEENT: Denies photophobia, eye pain, redness, hearing loss, ear pain, congestion, sore throat, rhinorrhea, sneezing, mouth sores, trouble swallowing, neck pain, neck stiffness and tinnitus.   Respiratory: Denies SOB, DOE, cough, chest tightness,  and wheezing.   Cardiovascular: Denies chest pain, palpitations and leg swelling.  Gastrointestinal: Denies nausea, vomiting, abdominal pain, diarrhea, constipation, blood in stool and abdominal distention.  Genitourinary: Denies dysuria, urgency, frequency, hematuria, flank pain and difficulty urinating.  Endocrine: Denies: hot or cold intolerance, sweats, changes in hair or nails, polyuria, polydipsia. Musculoskeletal: Denies myalgias, back pain, joint swelling, arthralgias and gait problem.  Skin: Denies pallor, rash and wound.  Neurological: Denies  seizures, syncope, weakness,  numbness and headaches.  Hematological: Denies adenopathy. Easy bruising, personal or family bleeding history  Psychiatric/Behavioral: Denies suicidal ideation, mood changes, confusion, nervousness, sleep disturbance and agitation    Physical Exam: Vitals:   11/06/20 0657  Temp: 98.1 F (36.7 C)  TempSrc: Oral  SpO2: 96%  Weight: 147 lb 9.6 oz (67 kg)    Body mass index is 24.56 kg/m.   Constitutional: NAD, calm, comfortable Eyes: PERRL, lids and conjunctivae normal, wears corrective lenses ENMT: Mucous membranes are  moist.  Respiratory: clear to auscultation bilaterally, no wheezing, no crackles. Normal respiratory effort. No accessory muscle use.  Cardiovascular: Regular rate and rhythm, no murmurs / rubs / gallops. No extremity edema. Neurologic: Grossly intact and nonfocal Psychiatric: Normal judgment and insight. Alert and oriented x 3. Normal mood.    Impression and Plan:  Dizziness -Despite negative orthostatic vital signs in office, the symptoms she describes are typical with orthostasis.  We were not able to reproduce the dizziness today, this may happen during instances when she is dehydrated, I also wonder about mild adrenal insufficiency given long-term steroids (although budesonide is not usually considered a steroid with systemic effects). -She will purchase a blood pressure cuff and start monitoring orthostatics at home. -We have also talked about the possibility of sinus congestion especially given her constant postnasal drip.  She will continue Mucinex and add an antihistamine to see if this provides any relief.  Osteopenia of neck of left femur  - Plan: DG Bone Density  Time spent: 32 minutes reviewing chart, interviewing and examining patient and formulating plan of care.     Lelon Frohlich, MD [facility]

## 2020-11-07 ENCOUNTER — Other Ambulatory Visit: Payer: Medicare PPO

## 2020-11-07 ENCOUNTER — Other Ambulatory Visit: Payer: Self-pay

## 2020-11-07 ENCOUNTER — Other Ambulatory Visit: Payer: Self-pay | Admitting: Internal Medicine

## 2020-11-07 ENCOUNTER — Ambulatory Visit (INDEPENDENT_AMBULATORY_CARE_PROVIDER_SITE_OTHER)
Admission: RE | Admit: 2020-11-07 | Discharge: 2020-11-07 | Disposition: A | Discharge status: Home / Self Care | Payer: Medicare PPO | Source: Ambulatory Visit | Attending: Internal Medicine | Admitting: Internal Medicine

## 2020-11-07 ENCOUNTER — Ambulatory Visit: Payer: Medicare PPO

## 2020-11-07 DIAGNOSIS — M85852 Other specified disorders of bone density and structure, left thigh: Secondary | ICD-10-CM | POA: Diagnosis not present

## 2020-11-07 DIAGNOSIS — J479 Bronchiectasis, uncomplicated: Secondary | ICD-10-CM

## 2020-11-07 DIAGNOSIS — Z23 Encounter for immunization: Secondary | ICD-10-CM

## 2020-11-07 NOTE — Progress Notes (Signed)
   Covid-19 Vaccination Clinic  Name:  Beverly [name]    MRN: [id number] DOB: 09-18-1945  11/07/2020  Beverly [name] was observed post Covid-19 immunization for 15 minutes without incident. She was provided with Vaccine Information Sheet and instruction to access the V-Safe system.   Beverly [name] was instructed to call [phone] with any severe reactions post vaccine: Difficulty breathing  Swelling of face and throat  A fast heartbeat  A bad rash all over body  Dizziness and weakness      Beryle Flock, RN

## 2020-11-11 ENCOUNTER — Other Ambulatory Visit: Payer: Self-pay

## 2020-11-12 ENCOUNTER — Encounter: Payer: Medicare PPO | Admitting: Internal Medicine

## 2020-11-17 ENCOUNTER — Encounter: Payer: Self-pay | Admitting: Internal Medicine

## 2020-11-17 ENCOUNTER — Other Ambulatory Visit: Payer: Self-pay | Admitting: Internal Medicine

## 2020-11-17 DIAGNOSIS — J309 Allergic rhinitis, unspecified: Secondary | ICD-10-CM

## 2020-11-17 DIAGNOSIS — J45998 Other asthma: Secondary | ICD-10-CM

## 2020-11-18 ENCOUNTER — Other Ambulatory Visit (HOSPITAL_COMMUNITY): Payer: Self-pay

## 2020-11-18 ENCOUNTER — Other Ambulatory Visit: Payer: Self-pay | Admitting: Internal Medicine

## 2020-11-18 DIAGNOSIS — J45998 Other asthma: Secondary | ICD-10-CM

## 2020-11-18 DIAGNOSIS — J309 Allergic rhinitis, unspecified: Secondary | ICD-10-CM

## 2020-11-19 ENCOUNTER — Other Ambulatory Visit (HOSPITAL_COMMUNITY): Payer: Self-pay

## 2020-11-19 MED FILL — Montelukast Sodium Tab 10 MG (Base Equiv): ORAL | 90 days supply | Qty: 90 | Fill #0 | Status: AC

## 2020-11-20 ENCOUNTER — Ambulatory Visit (INDEPENDENT_AMBULATORY_CARE_PROVIDER_SITE_OTHER): Payer: Medicare PPO | Admitting: Internal Medicine

## 2020-11-20 ENCOUNTER — Other Ambulatory Visit (HOSPITAL_COMMUNITY): Payer: Self-pay

## 2020-11-20 ENCOUNTER — Other Ambulatory Visit (INDEPENDENT_AMBULATORY_CARE_PROVIDER_SITE_OTHER): Payer: Medicare PPO

## 2020-11-20 ENCOUNTER — Other Ambulatory Visit: Payer: Self-pay | Admitting: Internal Medicine

## 2020-11-20 ENCOUNTER — Other Ambulatory Visit: Payer: Self-pay

## 2020-11-20 ENCOUNTER — Encounter: Payer: Self-pay | Admitting: Internal Medicine

## 2020-11-20 VITALS — BP 110/64 | HR 67 | Temp 98.4°F | Ht 64.0 in | Wt 152.4 lb

## 2020-11-20 DIAGNOSIS — I700 Atherosclerosis of aorta: Secondary | ICD-10-CM | POA: Diagnosis not present

## 2020-11-20 DIAGNOSIS — D801 Nonfamilial hypogammaglobulinemia: Secondary | ICD-10-CM

## 2020-11-20 DIAGNOSIS — Z0000 Encounter for general adult medical examination without abnormal findings: Secondary | ICD-10-CM

## 2020-11-20 DIAGNOSIS — E782 Mixed hyperlipidemia: Secondary | ICD-10-CM

## 2020-11-20 DIAGNOSIS — J309 Allergic rhinitis, unspecified: Secondary | ICD-10-CM

## 2020-11-20 DIAGNOSIS — J45998 Other asthma: Secondary | ICD-10-CM

## 2020-11-20 LAB — VITAMIN D 25 HYDROXY (VIT D DEFICIENCY, FRACTURES): VITD: 21.91 ng/mL — ABNORMAL LOW (ref 30.00–100.00)

## 2020-11-20 LAB — LIPID PANEL
Cholesterol: 137 mg/dL (ref 0–200)
HDL: 40.9 mg/dL (ref >39.00)
LDL Cholesterol: 70 mg/dL (ref 0–99)
NonHDL: 96.14
Total CHOL/HDL Ratio: 3
Triglycerides: 129 mg/dL (ref 0.0–149.0)
VLDL: 25.8 mg/dL (ref 0.0–40.0)

## 2020-11-20 LAB — CBC WITH DIFFERENTIAL/PLATELET
Basophils Absolute: 0 10*3/uL (ref 0.0–0.1)
Basophils Relative: 0.5 % (ref 0.0–3.0)
Eosinophils Absolute: 0.1 10*3/uL (ref 0.0–0.7)
Eosinophils Relative: 1.8 % (ref 0.0–5.0)
HCT: 31.6 % — ABNORMAL LOW (ref 36.0–46.0)
Hemoglobin: 10.6 g/dL — ABNORMAL LOW (ref 12.0–15.0)
Lymphocytes Relative: 26.4 % (ref 12.0–46.0)
Lymphs Abs: 1.7 10*3/uL (ref 0.7–4.0)
MCHC: 33.7 g/dL (ref 30.0–36.0)
MCV: 95.5 fl (ref 78.0–100.0)
Monocytes Absolute: 0.7 10*3/uL (ref 0.1–1.0)
Monocytes Relative: 10.2 % (ref 3.0–12.0)
Neutro Abs: 3.9 10*3/uL (ref 1.4–7.7)
Neutrophils Relative %: 61.1 % (ref 43.0–77.0)
Platelets: 117 10*3/uL — ABNORMAL LOW (ref 150.0–400.0)
RBC: 3.3 Mil/uL — ABNORMAL LOW (ref 3.87–5.11)
RDW: 16.3 % — ABNORMAL HIGH (ref 11.5–15.5)
WBC: 6.4 10*3/uL (ref 4.0–10.5)

## 2020-11-20 LAB — COMPREHENSIVE METABOLIC PANEL
ALT: 29 U/L (ref 0–35)
AST: 34 U/L (ref 0–37)
Albumin: 4 g/dL (ref 3.5–5.2)
Alkaline Phosphatase: 84 U/L (ref 39–117)
BUN: 15 mg/dL (ref 6–23)
CO2: 27 mEq/L (ref 19–32)
Calcium: 9 mg/dL (ref 8.4–10.5)
Chloride: 105 mEq/L (ref 96–112)
Creatinine, Ser: 0.76 mg/dL (ref 0.40–1.20)
GFR: 78.07 mL/min (ref >60.00)
Glucose, Bld: 85 mg/dL (ref 70–99)
Potassium: 4.3 mEq/L (ref 3.5–5.1)
Sodium: 138 mEq/L (ref 135–145)
Total Bilirubin: 0.3 mg/dL (ref 0.2–1.2)
Total Protein: 6.6 g/dL (ref 6.0–8.3)

## 2020-11-20 LAB — TSH: TSH: 6.28 u[IU]/mL — ABNORMAL HIGH (ref 0.35–5.50)

## 2020-11-20 LAB — MAGNESIUM: Magnesium: 2 mg/dL (ref 1.5–2.5)

## 2020-11-20 LAB — VITAMIN B12: Vitamin B-12: 518 pg/mL (ref 211–911)

## 2020-11-20 MED ORDER — MONTELUKAST SODIUM 10 MG PO TABS
ORAL_TABLET | Freq: Every day | ORAL | 1 refills | Status: DC
  Filled 2020-11-20: qty 90, fill #0
  Filled 2020-11-22 – 2021-02-03 (×3): qty 90, 90d supply, fill #0

## 2020-11-20 NOTE — Patient Instructions (Signed)
-  Nice seeing you today!!  -Lab work today; will notify you once results are available.  -Schedule follow up in 6 months or sooner as needed.   

## 2020-11-20 NOTE — Progress Notes (Signed)
Established Patient Office Visit     This visit occurred during the SARS-CoV-2 public health emergency.  Safety protocols were in place, including screening questions prior to the visit, additional usage of staff PPE, and extensive cleaning of exam room while observing appropriate contact time as indicated for disinfecting solutions.    CC/Reason for Visit: Annual preventive exam and subsequent Medicare wellness visit  HPI: Beverly [name] is a 75 y.o. female who is coming in today for the above mentioned reasons. Past Medical History is significant for: Obstructive sleep apnea, hyperlipidemia, chronic pain syndrome, asthma, GERD, she also is a history of a pulmonary nocardia infection and is on Bactrim till the end of the year followed by infectious diseases, history of hypogammaglobulinemia on weekly IVIG injections directed by allergy, she also was recently diagnosed with lymphocytic colitis.  She has routine eye and dental care.  No perceived hearing issues.  She does not exercise routinely.  All cancer screening is up-to-date, she has not had a Pap smear since her hysterectomy years ago.  All immunizations are up-to-date.   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   right sholder   BCC (basal cell carcinoma of skin) 05/09/2014   mid lower back   BCC (basal cell carcinoma of skin) 05/03/2017   right low back   BCC (basal cell carcinoma of skin) 05/03/2017   left upper back   BCC (basal cell carcinoma of skin) 07/05/2018   left mid back   BCC (basal cell carcinoma of skin) 05/20/1992   upper back   BCC (basal cell carcinoma of skin) 07/29/1993   left sholder medial   BCC (basal cell carcinoma of skin) 07/29/1993   left sholder lateral   BCC (basal  cell carcinoma of skin) 07/29/1993   right thigh   BCC (basal cell carcinoma of skin) 07/29/1993   right sholder   BCC (basal cell carcinoma of skin) 12/22/1994   right mid forearm   BCC (basal cell carcinoma of skin) 12/22/1994   right upper forearm   BCC (basal cell carcinoma of skin) 12/22/1994   lower right upper arm   BCC (basal cell carcinoma of skin) 12/22/1994   right upper arm sholder   BCC (basal cell carcinoma of skin) 08/11/1995   left leg below knee   BCC (basal cell carcinoma of skin) 04/11/2002   mid back   BCC (basal cell carcinoma of skin) 12/10/2002   right center upper back   BCC (basal cell carcinoma of skin) 05/26/2005   right post sholder   BCC (basal cell carcinoma) 05/09/2014   left inner shin   BCC (basal cell carcinoma) 06/12/2014   left forearm   Bowen's disease 10/07/1994   right post knee, right inner forearm/wrist   Bowen's disease 08/11/1995   left sholder   Bronchiectasis ([place])    Cataract    left   Chronic pain    Depression    Diverticulosis of colon 1998   mild   DJD (degenerative joint disease)    Duodenal ulcer    h/o   GERD (gastroesophageal reflux disease)    History of SCC (squamous cell carcinoma) of skin    Dr. Denna Haggard   History of sinus  bradycardia    HLD (hyperlipidemia)    hypertriglyceridemia   Hypertensive retinopathy of both eyes 11/18/2017   IBS (irritable bowel syndrome)    Internal hemorrhoids 1998   Lymphocytic colitis    Mitral regurgitation    mild   Nocardiosis    Osteoarthritis    feet,shoulder,neck,back,hips and hands.   Pneumonia    PONV (postoperative nausea and vomiting)    Rotator cuff tear, right 02/2019   infraspinatus and supraspinatus, and dislocation of long head of bicep tendon (Dr. Alvan Dame)   SCC (squamous cell carcinoma) 11/26/2014   left hand, right hand, right deltoid   SCC (squamous cell carcinoma) 05/03/2017   left cheek   Sleep apnea    uses a mouth guard nightly   Squamous cell  carcinoma in situ (SCCIS) 07/05/2018   left hand   Tracheobronchomalacia    Vitamin D deficiency    mild    Past Surgical History:  Procedure Laterality Date   ABDOMINAL HYSTERECTOMY     BLEPHAROPLASTY Bilateral 01/2018   BROW LIFT Bilateral 10/10/2018   Procedure: BROW LIFT;  Surgeon: Wallace Going, DO;  Location: [facility];  Service: Plastics;  Laterality: Bilateral;   BUNIONECTOMY     R 12/08, L 2004 (Dr. Janus Molder)   [place]   bilateral   CATARACT EXTRACTION Bilateral Left in 08/2012, Right 04/2016   Dr.Hecker   CESAREAN SECTION     x2   CHOLECYSTECTOMY  1992   CLOSED REDUCTION NASAL FRACTURE N/A 08/29/2018   Procedure: CLOSED REDUCTION NASAL FRACTURE;  Surgeon: Wallace Going, DO;  Location: [facility];  Service: Plastics;  Laterality: N/A;  1 hour, please   COLONOSCOPY  2006   COLONOSCOPY  01/2012   due again 01/2022; mild diverticulosis   epidural steroid injection, back  02/2010   HIP SURGERY     right bursectomy x 3   HIP SURGERY Right 2000   torn cartilage, repaired   INGUINAL HERNIA REPAIR  9/09   bilat   NECK SURGERY  1989   c6-7 cervical laminectomy and diskecotmy   NECK SURGERY  1989   per patient posterior area of neck   REVERSE SHOULDER ARTHROPLASTY Right 04/11/2019   Procedure: REVERSE SHOULDER ARTHROPLASTY;  Surgeon: Nicholes Stairs, MD;  Location: [facility];  Service: Orthopedics;  Laterality: Right;  [facility]   SHOULDER SURGERY  2/05   left rotator cuff repair   SHOULDER SURGERY Left 03/14/2018   rotator cuff repair; Dr. Tonita Cong   TONSILLECTOMY  age 20   TOTAL ABDOMINAL HYSTERECTOMY W/ BILATERAL SALPINGOOPHORECTOMY  1997   fibroids   TROCHANTERIC BURSA EXCISION Right 1978   TUBAL LIGATION  1986   WISDOM TOOTH EXTRACTION      Social History:  reports that she has never smoked. She has never used smokeless tobacco. She reports current alcohol use of about 7.0 standard drinks per  week. She reports that she does not use drugs.  Allergies: Allergies  Allergen Reactions   Adhesive [Tape] Rash   Codeine Rash    Family History:  Family History  Problem Relation Age of Onset   Depression Mother    Schizophrenia Mother    Cancer Father        oral   Heart disease Father        bradycardia   Esophageal cancer Father    Cancer Sister        skin and lung   COPD Sister  Hypertension Sister    Osteoporosis Sister        compression fx's x 3 11/2018   Hyperthyroidism Brother    Cancer Brother        metastatic cancer to bone (?primary)   Hyperlipidemia Daughter    Asthma Son    Cancer Paternal Grandmother 61       colon cancer, metastatic to liver   Colon cancer Paternal Grandmother    Liver cancer Paternal Grandmother    Diabetes Maternal Grandmother    ADD / ADHD Other      Current Outpatient Medications:    acetaminophen (TYLENOL) 500 MG tablet, Take 1,000 mg by mouth every 6 (six) hours as needed for moderate pain. , Disp: , Rfl:    albuterol (PROVENTIL HFA;VENTOLIN HFA) 108 (90 Base) MCG/ACT inhaler, Inhale 2 puffs into the lungs every 6 (six) hours as needed for wheezing or shortness of breath., Disp: 18 g, Rfl: 1   aspirin EC 81 MG tablet, Take 81 mg by mouth daily., Disp: , Rfl:    atorvastatin (LIPITOR) 40 MG tablet, Take 1 tablet by mouth daily., Disp: 90 tablet, Rfl: 1   Buprenorphine HCl (BELBUCA) 450 MCG FILM, Apply 1 film to inside of mouth every twelve hours, Disp: 60 Film, Rfl: 2   Calcium Carbonate-Vitamin D 600-200 MG-UNIT TABS, Take 1 tablet by mouth 2 (two) times daily., Disp: , Rfl:    celecoxib (CELEBREX) 200 MG capsule, TAKE 1 CAPSULE BY MOUTH 2 TIMES DAILY, Disp: 180 capsule, Rfl: 1   diclofenac Sodium (VOLTAREN) 1 % GEL, Apply 2 g topically 4 (four) times daily as needed (joint pain)., Disp: , Rfl:    Docusate Sodium 100 MG capsule, 1 capsule daily, Disp: , Rfl:    esomeprazole (NEXIUM) 40 MG capsule, TAKE 1 CAPSULE BY MOUTH  DAILY IN THE MORNING, Disp: 90 capsule, Rfl: 2   gabapentin (NEURONTIN) 800 MG tablet, TAKE 1 TABLET BY MOUTH ONCE DAILY AT BEDTIME, Disp: 90 tablet, Rfl: 2   GUAIFENESIN ER PO, Take 400 mg by mouth 2 (two) times daily., Disp: , Rfl:    HYDROcodone-acetaminophen (NORCO) 7.5-325 MG tablet, Take 1 tablet by mouth four times a day as needed for pain, Disp: 100 tablet, Rfl: 0   imiquimod (ALDARA) 5 % cream, Apply to affected areas on Monday,Wednesday, and Friday as directed for 6 weeks- 8 weeks, Disp: 12 each, Rfl: 0   Immune Globulin, Human,-klhw (XEMBIFY) 10 GM/50ML SOLN, Xembify 10 gram/50 mL (20 %) subcutaneous solution  INJECT 10G SUBCUTANEOUSLY EVERY WEEK, Disp: , Rfl:    ipratropium (ATROVENT) 0.03 % nasal spray, Place 1 spray into both nostrils 3 (three) times daily., Disp: 30 mL, Rfl: 12   Melatonin-Pyridoxine (MELATIN PO), , Disp: , Rfl:    methocarbamol (ROBAXIN) 500 MG tablet, Take 1 tablet (500 mg total) by mouth 3 (three) times daily., Disp: 360 tablet, Rfl: 0   Misc Natural Products (FIBER 7 PO), 2 gummies daily, Disp: , Rfl:    montelukast (SINGULAIR) 10 MG tablet, TAKE 1 TABLET BY MOUTH AT BEDTIME., Disp: 90 tablet, Rfl: 1   Multiple Vitamins-Minerals (MULTIVITAMIN WITH MINERALS) tablet, Take 1 tablet by mouth daily., Disp: , Rfl:    omega-3 acid ethyl esters (LOVAZA) 1 g capsule, TAKE 2 CAPSULES BY MOUTH 2 TIMES DAILY, Disp: 180 capsule, Rfl: 1   PARoxetine (PAXIL) 30 MG tablet, TAKE 2 TABLETS BY MOUTH ONCE A DAY (Patient taking differently: Take 30 mg by mouth daily.), Disp: 180 tablet, Rfl: 1  Respiratory Therapy Supplies (FLUTTER) DEVI, 1 puff by Does not apply route 2 (two) times daily., Disp: 1 each, Rfl: 0   sulfamethoxazole-trimethoprim (BACTRIM DS) 800-160 MG tablet, Take 1 tablet by mouth 2 (two) times daily., Disp: 180 tablet, Rfl: 1  Review of Systems:  Constitutional: Denies fever, chills, diaphoresis, appetite change and fatigue.  HEENT: Denies photophobia, eye pain,  redness, hearing loss, ear pain, congestion, sore throat, rhinorrhea, sneezing, mouth sores, trouble swallowing, neck pain, neck stiffness and tinnitus.   Respiratory: Denies SOB, DOE, cough, chest tightness,  and wheezing.   Cardiovascular: Denies chest pain, palpitations and leg swelling.  Gastrointestinal: Denies nausea, vomiting, abdominal pain, diarrhea, constipation, blood in stool and abdominal distention.  Genitourinary: Denies dysuria, urgency, frequency, hematuria, flank pain and difficulty urinating.  Endocrine: Denies: hot or cold intolerance, sweats, changes in hair or nails, polyuria, polydipsia. Musculoskeletal: Denies myalgias, back pain, joint swelling, arthralgias and gait problem.  Skin: Denies pallor, rash and wound.  Neurological: Denies dizziness, seizures, syncope, weakness, light-headedness, numbness and headaches.  Hematological: Denies adenopathy. Easy bruising, personal or family bleeding history  Psychiatric/Behavioral: Denies suicidal ideation, mood changes, confusion, nervousness, sleep disturbance and agitation    Physical Exam: Vitals:   11/20/20 1528  BP: 110/64  Pulse: 67  Temp: 98.4 F (36.9 C)  TempSrc: Oral  SpO2: 97%  Weight: 152 lb 6.4 oz (69.1 kg)  Height: 5\' 4"  (1.626 m)    Body mass index is 26.16 kg/m.   Constitutional: NAD, calm, comfortable Eyes: PERRL, lids and conjunctivae normal, wears corrective lenses ENMT: Mucous membranes are moist. Posterior pharynx clear of any exudate or lesions. Normal dentition. Tympanic membrane is pearly white, no erythema or bulging. Neck: normal, supple, no masses, no thyromegaly Respiratory: clear to auscultation bilaterally, no wheezing, no crackles. Normal respiratory effort. No accessory muscle use.  Cardiovascular: Regular rate and rhythm, no murmurs / rubs / gallops. No extremity edema. 2+ pedal pulses. No carotid bruits.  Abdomen: no tenderness, no masses palpated. No hepatosplenomegaly. Bowel  sounds positive.  Musculoskeletal: no clubbing / cyanosis. No joint deformity upper and lower extremities. Good ROM, no contractures. Normal muscle tone.  Skin: no rashes, lesions, ulcers. No induration Neurologic: CN 2-12 grossly intact. Sensation intact, DTR normal. Strength 5/5 in all 4.  Psychiatric: Normal judgment and insight. Alert and oriented x 3. Normal mood.    Subsequent Medicare wellness visit   1. Risk factors, based on past  M,S,F -cardiovascular disease risk factors include former   2.  Physical activities: Just with activities of daily living, no formal physical exercise   3.  Depression/mood: Stable, no depression   4.  Hearing: No perceived issues   5.  ADL's: Independent in all ADLs   6.  Fall risk: Low fall risk   7.  Home safety: No problems identified   8.  Height weight, and visual acuity: height and weight as above, vision:  Vision Screening   Right eye Left eye Both eyes  Without correction     With correction 20/20 20/20 20/20   Comments: Dr Kathlen Mody   9.  Counseling: Advise to increase physical activity   10. Lab orders based on risk factors: Laboratory update will be reviewed   11. Referral : None today   12. Care plan: Follow-up with me in 6 to 12 months   13. Cognitive assessment: No cognitive impairment   14. Screening: Patient provided with a written and personalized 5-10 year screening schedule in the AVS. yes  15. Provider List Update: PCP, GI, infectious diseases, pulmonary, cardiology  81. Advance Directives: Full code   17. Opioids: She takes Norco on an as-needed basis only  [employer] Visit from 11/20/2020 in [place] at [facility]  PHQ-9 Total Score 0       Fall Risk  11/20/2020 08/28/2020 08/06/2020 02/20/2020 01/10/2020  Falls in the past year? 0 0 0 0 0  Number falls in past yr: 0 - - 0 -  Comment - - - - -  Injury with Fall? 0 - - 0 -  Comment - - - - -  Risk for fall due to : - - History of fall(s)  - No Fall Risks  Follow up - Falls evaluation completed Falls evaluation completed - Falls evaluation completed     Impression and Plan:  Encounter for preventive health examination   Mixed hyperlipidemia - Plan: Lipid panel  Aortic atherosclerosis ([place])  Hypogammaglobulinemia ([employer])   -She has routine eye and dental care. -All immunizations are up-to-date including influenza and COVID booster. -Labs will be updated today. -Healthy lifestyle discussed in detail. -She had a DEXA scan last month without signs of osteoporosis. -She had a colonoscopy in April 2022. -She had a mammogram in November 2021 and already has her scheduled for next month. -She no longer does Pap smears at the urging of her gynecologist following her hysterectomy.    Patient Instructions  -Nice seeing you today!!  -Lab work today; will notify you once results are available.  -Schedule follow up in 6 months or sooner as needed.     Lelon Frohlich, MD [facility]

## 2020-11-21 ENCOUNTER — Other Ambulatory Visit: Payer: Self-pay | Admitting: Internal Medicine

## 2020-11-21 ENCOUNTER — Other Ambulatory Visit (HOSPITAL_COMMUNITY): Payer: Self-pay

## 2020-11-21 ENCOUNTER — Encounter: Payer: Self-pay | Admitting: Internal Medicine

## 2020-11-21 DIAGNOSIS — H35033 Hypertensive retinopathy, bilateral: Secondary | ICD-10-CM | POA: Diagnosis not present

## 2020-11-21 DIAGNOSIS — H35373 Puckering of macula, bilateral: Secondary | ICD-10-CM | POA: Diagnosis not present

## 2020-11-21 DIAGNOSIS — E559 Vitamin D deficiency, unspecified: Secondary | ICD-10-CM

## 2020-11-21 DIAGNOSIS — G453 Amaurosis fugax: Secondary | ICD-10-CM | POA: Diagnosis not present

## 2020-11-21 DIAGNOSIS — R7989 Other specified abnormal findings of blood chemistry: Secondary | ICD-10-CM

## 2020-11-21 DIAGNOSIS — Z961 Presence of intraocular lens: Secondary | ICD-10-CM | POA: Diagnosis not present

## 2020-11-21 DIAGNOSIS — D649 Anemia, unspecified: Secondary | ICD-10-CM

## 2020-11-21 MED ORDER — VITAMIN D (ERGOCALCIFEROL) 1.25 MG (50000 UNIT) PO CAPS
50000.0000 [IU] | ORAL_CAPSULE | ORAL | 0 refills | Status: DC
  Filled 2020-11-21: qty 12, 84d supply, fill #0

## 2020-11-22 ENCOUNTER — Other Ambulatory Visit: Payer: Self-pay | Admitting: Internal Medicine

## 2020-11-22 ENCOUNTER — Encounter: Payer: Self-pay | Admitting: Internal Medicine

## 2020-11-22 ENCOUNTER — Other Ambulatory Visit (INDEPENDENT_AMBULATORY_CARE_PROVIDER_SITE_OTHER): Payer: Medicare PPO

## 2020-11-22 ENCOUNTER — Other Ambulatory Visit (HOSPITAL_COMMUNITY): Payer: Self-pay

## 2020-11-22 DIAGNOSIS — D649 Anemia, unspecified: Secondary | ICD-10-CM | POA: Diagnosis not present

## 2020-11-22 DIAGNOSIS — E039 Hypothyroidism, unspecified: Secondary | ICD-10-CM

## 2020-11-22 DIAGNOSIS — R7989 Other specified abnormal findings of blood chemistry: Secondary | ICD-10-CM

## 2020-11-22 LAB — IBC + FERRITIN
Ferritin: 21.4 ng/mL (ref 10.0–291.0)
Iron: 49 ug/dL (ref 42–145)
Saturation Ratios: 13.2 % — ABNORMAL LOW (ref 20.0–50.0)
TIBC: 371 ug/dL (ref 250.0–450.0)
Transferrin: 265 mg/dL (ref 212.0–360.0)

## 2020-11-22 LAB — T3, FREE: T3, Free: 2.6 pg/mL (ref 2.3–4.2)

## 2020-11-22 LAB — T4, FREE: Free T4: 0.48 ng/dL — ABNORMAL LOW (ref 0.60–1.60)

## 2020-11-22 MED ORDER — LEVOTHYROXINE SODIUM 25 MCG PO TABS
25.0000 ug | ORAL_TABLET | Freq: Every day | ORAL | 1 refills | Status: DC
  Filled 2020-11-22: qty 90, 90d supply, fill #0
  Filled 2021-02-17: qty 90, 90d supply, fill #1

## 2020-11-25 ENCOUNTER — Other Ambulatory Visit (HOSPITAL_COMMUNITY): Payer: Self-pay

## 2020-11-26 ENCOUNTER — Encounter: Payer: Self-pay | Admitting: Allergy and Immunology

## 2020-11-26 ENCOUNTER — Other Ambulatory Visit (HOSPITAL_COMMUNITY): Payer: Self-pay

## 2020-11-26 DIAGNOSIS — J479 Bronchiectasis, uncomplicated: Secondary | ICD-10-CM

## 2020-11-26 NOTE — Telephone Encounter (Signed)
Dr. Desai, please see mychart message sent by pt and advise. 

## 2020-11-27 ENCOUNTER — Other Ambulatory Visit (HOSPITAL_COMMUNITY): Payer: Self-pay

## 2020-11-27 NOTE — Telephone Encounter (Signed)
Per pt:  Yes. Since I am home most days I should be able to work in the sessions with the vests. I have read patient comments about how effective the vest is for them.  I seem to have most of my sputum in the afternoons and evenings. I am back to expectorating thick mucous and plugs which are dark gold. I continue to have drainage down my throat which is also thick at times. I am taking Zyrtec, not Claritin because that is what I had on hand. When I finish the Zyrtec I will switch to Claritin. The nasal sprays keep me from having nasal congestion/drainage. This is especially helpful at night but also during the day.  I'm sorry to take up so much of your time.  Thank you. Kind regards, Beverly [name]

## 2020-11-28 ENCOUNTER — Encounter: Payer: Self-pay | Admitting: Internal Medicine

## 2020-11-28 DIAGNOSIS — G894 Chronic pain syndrome: Secondary | ICD-10-CM | POA: Diagnosis not present

## 2020-11-28 DIAGNOSIS — M150 Primary generalized (osteo)arthritis: Secondary | ICD-10-CM | POA: Diagnosis not present

## 2020-11-28 DIAGNOSIS — M47816 Spondylosis without myelopathy or radiculopathy, lumbar region: Secondary | ICD-10-CM | POA: Diagnosis not present

## 2020-11-28 DIAGNOSIS — Z79891 Long term (current) use of opiate analgesic: Secondary | ICD-10-CM | POA: Diagnosis not present

## 2020-11-29 ENCOUNTER — Encounter: Payer: Self-pay | Admitting: Family Medicine

## 2020-11-29 ENCOUNTER — Ambulatory Visit: Payer: Medicare PPO | Admitting: Family Medicine

## 2020-11-29 ENCOUNTER — Other Ambulatory Visit: Payer: Self-pay

## 2020-11-29 VITALS — BP 118/62 | HR 63 | Temp 98.9°F | Resp 16

## 2020-11-29 DIAGNOSIS — M79605 Pain in left leg: Secondary | ICD-10-CM | POA: Diagnosis not present

## 2020-11-29 DIAGNOSIS — J479 Bronchiectasis, uncomplicated: Secondary | ICD-10-CM | POA: Diagnosis not present

## 2020-11-29 DIAGNOSIS — M79604 Pain in right leg: Secondary | ICD-10-CM

## 2020-11-29 DIAGNOSIS — D839 Common variable immunodeficiency, unspecified: Secondary | ICD-10-CM | POA: Diagnosis not present

## 2020-11-29 DIAGNOSIS — A439 Nocardiosis, unspecified: Secondary | ICD-10-CM

## 2020-11-29 DIAGNOSIS — K219 Gastro-esophageal reflux disease without esophagitis: Secondary | ICD-10-CM | POA: Diagnosis not present

## 2020-11-29 DIAGNOSIS — K52832 Lymphocytic colitis: Secondary | ICD-10-CM | POA: Insufficient documentation

## 2020-11-29 DIAGNOSIS — J4540 Moderate persistent asthma, uncomplicated: Secondary | ICD-10-CM

## 2020-11-29 NOTE — Patient Instructions (Signed)
  1.  Continue immunoglobulin infusions every week  2.  Continue to treat reflex / LPR:   A. Nexium 40 mg in AM  B. Famotidine 40 mg in PM IF NEEDED  3.  Treat and prevent inflammation of upper airway:   A.  Montelukast 10 mg -1 tablet 1 time per day  B.  OTC Nasacort -1 spray each nostril 1 time per day  4. If needed:  A. Loratadine 10 mg 1 tablet 1 time per day  B. Ipratropium 0.06% - 2 sprays each nostril every 6 hours (4x/day)  5. Get a bone density test  6. We will place a referral to rheumatology  7. Follow up with Dr. Neldon Mc in 3 months or sooner if needed

## 2020-11-29 NOTE — Progress Notes (Signed)
[place] [address] Dept: [phone]  FOLLOW UP NOTE  Patient ID: Beverly [name], female    DOB: 10/03/1945  Age: 75 y.o. MRN: [id number] Date of Office Visit: 11/29/2020  Assessment  Chief Complaint: Leg Pain  HPI Beverly [name] is a 75 year old female who presents to the clinic for an acute visit for pain in both legs. She was last seen in this clinic on 10/01/2020 by Dr. Neldon Mc for evaluation of CVID, bronchiectasis, history of Nocardia pneumonia, reflux, asthma, lymphocytic colitis, and allergic rhinitis.  At today's visit, she reports that over the last 3 weeks she began to experience heaviness in bilateral lower extremities as well as shooting pains while walking.  She reports that it feels as though she is "walking with lead boots on". She reports there has been gradual increase in weakness over the last 3 weeks.  She denies leg swelling, redness, or itchiness.  She has recently been diagnosed with hypothyroidism and has started on levothyroxine as well as vitamin D supplement. She reports that she has taken Atorvastatin for several years. Lymphocytic colitis is reported as well controlled with oral budesonide tapering off at the end of September.  CVID is reported as well controlled with Xembify infusions once a week with no adverse reaction.  She follows Dr.Desai with [facility] for management of bronchiectasis and Nocardia pneumonia.  She denies shortness of breath or wheeze, however, does report an increase in cough producing thick mucus.  She currently has a sputum sample working with Dr. Shearon Stalls. She we will continue Bactrim through December.  She continues montelukast 10 mg once a day and rarely uses albuterol, however, she did use albuterol this morning with relief of symptoms.  Allergic rhinitis is reported as moderately well controlled with frequent thick postnasal drainage for which she continues cetirizine 10 mg once a day, ipratropium 2 times a  day, and Nasacort daily. Her current medications are listed in the chart.   Drug Allergies:  Allergies  Allergen Reactions   Adhesive [Tape] Rash   Codeine Rash    Physical Exam: BP 118/62   Pulse 63   Temp 98.9 F (37.2 C) (Temporal)   Resp 16   SpO2 97%    Physical Exam Vitals reviewed.  Constitutional:      Appearance: Normal appearance.  HENT:     Head: Normocephalic and atraumatic.     Right Ear: Tympanic membrane normal.     Left Ear: Tympanic membrane normal.     Nose:     Comments: Bilateral nares normal.  Pharynx normal.  Ears normal.  Eyes normal.    Mouth/Throat:     Pharynx: Oropharynx is clear.  Eyes:     Conjunctiva/sclera: Conjunctivae normal.  Cardiovascular:     Rate and Rhythm: Normal rate and regular rhythm.     Heart sounds: Normal heart sounds. No murmur heard. Pulmonary:     Effort: Pulmonary effort is normal.     Breath sounds: Normal breath sounds.     Comments: Lungs clear to auscultation Musculoskeletal:        General: No swelling, deformity or signs of injury.     Cervical back: Normal range of motion and neck supple.     Right lower leg: No edema.     Left lower leg: No edema.  Skin:    General: Skin is warm and dry.  Neurological:     Mental Status: She is alert and oriented to person, place, and  time.  Psychiatric:        Mood and Affect: Mood normal.        Behavior: Behavior normal.        Thought Content: Thought content normal.        Judgment: Judgment normal.   Assessment and Plan: 1. CVID (common variable immunodeficiency) ([place])   2. Moderate persistent asthma without complication   3. Nocardia infection   4. Bronchiectasis without complication ([place])   5. Lymphocytic colitis   6. LPRD (laryngopharyngeal reflux disease)   7. Pain in both lower extremities      Patient Instructions   1.  Continue immunoglobulin infusions every week  2.  Continue to treat reflex / LPR:   A. Nexium 40 mg in AM  B. Famotidine 40  mg in PM IF NEEDED  3.  Treat and prevent inflammation of upper airway:   A.  Montelukast 10 mg -1 tablet 1 time per day  B.  OTC Nasacort -1 spray each nostril 1 time per day  4. If needed:  A. Loratadine 10 mg 1 tablet 1 time per day  B. Ipratropium 0.06% - 2 sprays each nostril every 6 hours (4x/day)  5. Get a bone density test  6. We will place a referral to rheumatology  7. Follow up with Dr. Neldon Mc in 3 months or sooner if needed Return in about 3 months (around 03/01/2021), or if symptoms worsen or fail to improve.    Thank you for the opportunity to care for this patient.  Please do not hesitate to contact me with questions.  Gareth Morgan, FNP [facility]

## 2020-11-29 NOTE — Telephone Encounter (Signed)
ND the pt stated that she wanted to use [employer] for the vest.  thanks

## 2020-12-09 ENCOUNTER — Other Ambulatory Visit (HOSPITAL_COMMUNITY): Payer: Self-pay

## 2020-12-09 ENCOUNTER — Other Ambulatory Visit: Payer: Self-pay | Admitting: Internal Medicine

## 2020-12-09 MED FILL — Omega-3-acid Ethyl Esters Cap 1 GM: ORAL | 45 days supply | Qty: 180 | Fill #0 | Status: AC

## 2020-12-10 ENCOUNTER — Other Ambulatory Visit (HOSPITAL_COMMUNITY): Payer: Self-pay

## 2020-12-10 ENCOUNTER — Encounter: Payer: Self-pay | Admitting: Internal Medicine

## 2020-12-10 ENCOUNTER — Other Ambulatory Visit: Payer: Self-pay

## 2020-12-10 ENCOUNTER — Ambulatory Visit: Payer: Medicare PPO | Admitting: Infectious Diseases

## 2020-12-10 ENCOUNTER — Encounter: Payer: Self-pay | Admitting: Infectious Diseases

## 2020-12-10 VITALS — BP 131/67 | HR 59 | Wt 150.8 lb

## 2020-12-10 DIAGNOSIS — Z5181 Encounter for therapeutic drug level monitoring: Secondary | ICD-10-CM | POA: Diagnosis not present

## 2020-12-10 DIAGNOSIS — D801 Nonfamilial hypogammaglobulinemia: Secondary | ICD-10-CM

## 2020-12-10 DIAGNOSIS — A439 Nocardiosis, unspecified: Secondary | ICD-10-CM

## 2020-12-10 MED ORDER — OMEGA-3-ACID ETHYL ESTERS 1 G PO CAPS
ORAL_CAPSULE | ORAL | 0 refills | Status: DC
  Filled 2020-12-10 – 2021-01-21 (×2): qty 360, 90d supply, fill #0

## 2020-12-10 NOTE — Progress Notes (Signed)
[facility]                                                             [place], [place], [place], [address]                                                                  Phn. [phone]; Fax: [phone]                                                                             Date: 12/10/20  Reason for Referral: Follow up for Nocardia pneumonia and diarrhea   Assessment Pulmonary nocardia infection ( 03/01/20 sputum AFB smear and cx negative ) Hypogammaglobulinemia- on weekly immunoglobulin injections, follows Allergist Lymphocytic colitis: Completed steroid course end of September 2022 Medication monitoring- BMP WNL on 11/20/20  Recommendations Continue  Bactrim 1 double strength tablet twice daily. Plan to treat through end of December 2022 Fu sputum AFB smear and cx from 11/07/20 BMP in 1 month  Fu in 2 months with me   Follow up for management of GERD/Asthma/LPR and Hypogammaglobulinemia per Pulmonary and Immunology    All questions and concerns were discussed and addressed. Patient verbalized understanding of the plan. ____________________________________________________________________________________________________________________ Subjective/Interval events 12/10/20 Here for follow up for Pulmonary Nocardia infection. Has been taking Bactrim PO twice daily without any issues. She continues to have productive cough with at least 6 cups of greyish stuff every day. She has been following Pulmonary and is getting measured for a pulmonary vest this Monday. She feels her breathing is getting worse. She was getting SOB when she was chatting with her family members this past weekend and has to take pauses in between. She has a help come in for doing cleaning for house and yard work for a long time. She however is able to do laundry, indoor and some outdoor planting. She has also  completed her steroids course around end of September. She is also following with Allergy and Pulmonology. She had a sputum AFB smear and cx collected on 9/22 at Pulmonology office. She has also been referred to Rheumatology for evaluation of bilateral leg pain for possible autoimmune causes. She tells me the leg pain have improved from when seen with Allergy/Immunology but still present.   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   right sholder   BCC (basal cell carcinoma of skin) 05/09/2014   mid lower back   BCC (  basal cell carcinoma of skin) 05/03/2017   right low back   BCC (basal cell carcinoma of skin) 05/03/2017   left upper back   BCC (basal cell carcinoma of skin) 07/05/2018   left mid back   BCC (basal cell carcinoma of skin) 05/20/1992   upper back   BCC (basal cell carcinoma of skin) 07/29/1993   left sholder medial   BCC (basal cell carcinoma of skin) 07/29/1993   left sholder lateral   BCC (basal cell carcinoma of skin) 07/29/1993   right thigh   BCC (basal cell carcinoma of skin) 07/29/1993   right sholder   BCC (basal cell carcinoma of skin) 12/22/1994   right mid forearm   BCC (basal cell carcinoma of skin) 12/22/1994   right upper forearm   BCC (basal cell carcinoma of skin) 12/22/1994   lower right upper arm   BCC (basal cell carcinoma of skin) 12/22/1994   right upper arm sholder   BCC (basal cell carcinoma of skin) 08/11/1995   left leg below knee   BCC (basal cell carcinoma of skin) 04/11/2002   mid back   BCC (basal cell carcinoma of skin) 12/10/2002   right center upper back   BCC (basal cell carcinoma of skin) 05/26/2005   right post sholder   BCC (basal cell carcinoma) 05/09/2014    left inner shin   BCC (basal cell carcinoma) 06/12/2014   left forearm   Bowen's disease 10/07/1994   right post knee, right inner forearm/wrist   Bowen's disease 08/11/1995   left sholder   Bronchiectasis ([place])    Cataract    left   Chronic pain    Depression    Diverticulosis of colon 1998   mild   DJD (degenerative joint disease)    Duodenal ulcer    h/o   GERD (gastroesophageal reflux disease)    History of SCC (squamous cell carcinoma) of skin    Dr. Denna Haggard   History of sinus bradycardia    HLD (hyperlipidemia)    hypertriglyceridemia   Hypertensive retinopathy of both eyes 11/18/2017   IBS (irritable bowel syndrome)    Internal hemorrhoids 1998   Lymphocytic colitis    Mitral regurgitation    mild   Nocardiosis    Osteoarthritis    feet,shoulder,neck,back,hips and hands.   Pneumonia    PONV (postoperative nausea and vomiting)    Rotator cuff tear, right 02/2019   infraspinatus and supraspinatus, and dislocation of long head of bicep tendon (Dr. Alvan Dame)   SCC (squamous cell carcinoma) 11/26/2014   left hand, right hand, right deltoid   SCC (squamous cell carcinoma) 05/03/2017   left cheek   Sleep apnea    uses a mouth guard nightly   Squamous cell carcinoma in situ (SCCIS) 07/05/2018   left hand   Tracheobronchomalacia    Vitamin D deficiency    mild   Past Surgical History:  Procedure Laterality Date   ABDOMINAL HYSTERECTOMY     BLEPHAROPLASTY Bilateral 01/2018   BROW LIFT Bilateral 10/10/2018   Procedure: BROW LIFT;  Surgeon: Wallace Going, DO;  Location: [facility];  Service: Plastics;  Laterality: Bilateral;   BUNIONECTOMY     R 12/08, L 2004 (Dr. Janus Molder)   [place]   bilateral   CATARACT EXTRACTION Bilateral Left in 08/2012, Right 04/2016   Dr.Hecker   CESAREAN SECTION     x2   CHOLECYSTECTOMY  1992   CLOSED REDUCTION NASAL FRACTURE N/A 08/29/2018  Procedure: CLOSED REDUCTION NASAL FRACTURE;  Surgeon:  Wallace Going, DO;  Location: [facility];  Service: Plastics;  Laterality: N/A;  1 hour, please   COLONOSCOPY  2006   COLONOSCOPY  01/2012   due again 01/2022; mild diverticulosis   epidural steroid injection, back  02/2010   HIP SURGERY     right bursectomy x 3   HIP SURGERY Right 2000   torn cartilage, repaired   INGUINAL HERNIA REPAIR  9/09   bilat   NECK SURGERY  1989   c6-7 cervical laminectomy and diskecotmy   NECK SURGERY  1989   per patient posterior area of neck   REVERSE SHOULDER ARTHROPLASTY Right 04/11/2019   Procedure: REVERSE SHOULDER ARTHROPLASTY;  Surgeon: Nicholes Stairs, MD;  Location: [facility];  Service: Orthopedics;  Laterality: Right;  Regional Block   SHOULDER SURGERY  2/05   left rotator cuff repair   SHOULDER SURGERY Left 03/14/2018   rotator cuff repair; Dr. Tonita Cong   TONSILLECTOMY  age 75   TOTAL ABDOMINAL HYSTERECTOMY W/ BILATERAL SALPINGOOPHORECTOMY  1997   fibroids   TROCHANTERIC BURSA EXCISION Right [place] EXTRACTION     Current Outpatient Medications on File Prior to Visit  Medication Sig Dispense Refill   celecoxib (CELEBREX) 200 MG capsule TAKE 1 CAPSULE BY MOUTH 2 TIMES DAILY 180 capsule 1   diclofenac Sodium (VOLTAREN) 1 % GEL Apply 2 g topically 4 (four) times daily as needed (joint pain).     Docusate Sodium 100 MG capsule 1 capsule daily     esomeprazole (NEXIUM) 40 MG capsule TAKE 1 CAPSULE BY MOUTH DAILY IN THE MORNING 90 capsule 2   gabapentin (NEURONTIN) 800 MG tablet TAKE 1 TABLET BY MOUTH ONCE DAILY AT BEDTIME 90 tablet 2   GUAIFENESIN ER PO Take 400 mg by mouth 2 (two) times daily.     HYDROcodone-acetaminophen (NORCO) 7.5-325 MG tablet Take 1 tablet by mouth four times a day as needed for pain 100 tablet 0   imiquimod (ALDARA) 5 % cream Apply to affected areas on Monday,Wednesday, and Friday as directed for 6 weeks- 8 weeks 12 each 0   Immune Globulin, Human,-klhw (XEMBIFY) 10  GM/50ML SOLN Xembify 10 gram/50 mL (20 %) subcutaneous solution  INJECT 10G SUBCUTANEOUSLY EVERY WEEK     ipratropium (ATROVENT) 0.03 % nasal spray Place 1 spray into both nostrils 3 (three) times daily. 30 mL 12   levothyroxine (SYNTHROID) 25 MCG tablet Take 1 tablet (25 mcg total) by mouth daily. 90 tablet 1   Melatonin-Pyridoxine (MELATIN PO)      methocarbamol (ROBAXIN) 500 MG tablet Take 1 tablet (500 mg total) by mouth 3 (three) times daily. 360 tablet 0   Misc Natural Products (FIBER 7 PO) 2 gummies daily     montelukast (SINGULAIR) 10 MG tablet TAKE 1 TABLET BY MOUTH AT BEDTIME. 90 tablet 1   Multiple Vitamins-Minerals (MULTIVITAMIN WITH MINERALS) tablet Take 1 tablet by mouth daily.     PARoxetine (PAXIL) 30 MG tablet TAKE 2 TABLETS BY MOUTH ONCE A DAY (Patient taking differently: Take 30 mg by mouth daily.) 180 tablet 1   Respiratory Therapy Supplies (FLUTTER) DEVI 1 puff by Does not apply route 2 (two) times daily. 1 each 0   sulfamethoxazole-trimethoprim (BACTRIM DS) 800-160 MG tablet Take 1 tablet by mouth 2 (two) times daily. 180 tablet 1   Vitamin D, Ergocalciferol, (DRISDOL) 1.25 MG (50000 UNIT) CAPS capsule  Take 1 capsule (50,000 Units total) by mouth every 7 days for 12 doses. 12 capsule 0   acetaminophen (TYLENOL) 500 MG tablet Take 1,000 mg by mouth every 6 (six) hours as needed for moderate pain.      albuterol (PROVENTIL HFA;VENTOLIN HFA) 108 (90 Base) MCG/ACT inhaler Inhale 2 puffs into the lungs every 6 (six) hours as needed for wheezing or shortness of breath. 18 g 1   aspirin EC 81 MG tablet Take 81 mg by mouth daily.     atorvastatin (LIPITOR) 40 MG tablet Take 1 tablet by mouth daily. 90 tablet 1   Buprenorphine HCl (BELBUCA) 450 MCG FILM Apply 1 film to inside of mouth every twelve hours 60 Film 2   Calcium Carbonate-Vitamin D 600-200 MG-UNIT TABS Take 1 tablet by mouth 2 (two) times daily.     [DISCONTINUED] potassium chloride (KLOR-CON) 10 MEQ tablet Take 2 tablets  (total 20 MEQ) by mouth once 2 tablet 0   No current facility-administered medications on file prior to visit.    Allergies  Allergen Reactions   Adhesive [Tape] Rash   Codeine Rash   Social History   Socioeconomic History   Marital status: Divorced    Spouse name: Not on file   Number of children: 2   Years of education: Not on file   Highest education level: Not on file  Occupational History   Occupation: STAFF NURSE    Employer: [employer]  Tobacco Use   Smoking status: Never   Smokeless tobacco: Never  Vaping Use   Vaping Use: Never used  Substance and Sexual Activity   Alcohol use: Yes    Alcohol/week: 7.0 standard drinks    Types: 7 Glasses of wine per week    Comment: 1 glass of wine daily   Drug use: No   Sexual activity: Not Currently  Other Topics Concern   Not on file  Social History Narrative   Divorced, lives alone, [place]; 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   [place] for [employer] part-time   Social Determinants of Health   Financial Resource Strain: Not on file  Food Insecurity: Not on file  Transportation Needs: Not on file  Physical Activity: Not on file  Stress: Not on file  Social Connections: Not on file  Intimate Partner Violence: Not on file    Vitals BP 131/67   Pulse (!) 59   Wt 150 lb 12.8 oz (68.4 kg)   SpO2 99%   BMI 25.88 kg/m    Examination  General - not in acute distress, comfortably sitting in chair HEENT - no pallor and no icterus Chest - b/l clear air entry, no additional sounds CVS- Normal I3K7, systolic murmur Abdomen - Soft, Non tender , non distended Ext- no pedal edema Neuro: grossly normal Back - WNL Psych : calm and cooperative   Recent labs CBC Latest Ref Rng & Units 11/20/2020 05/29/2020 05/06/2020  WBC 4.0 - 10.5 K/uL 6.4 4.6 5.2  Hemoglobin 12.0 - 15.0 g/dL  10.6(L) 10.6(L) 11.2(L)  Hematocrit 36.0 - 46.0 % 31.6(L) 33.6(L) 33.1(L)  Platelets 150.0 - 400.0 K/uL 117.0(L) 171 135(L)   CMP Latest Ref Rng & Units 11/20/2020 09/17/2020 08/27/2020  Glucose 70 - 99 mg/dL 85 79 78  BUN 6 - 23 mg/dL 15 16 18   Creatinine 0.40 - 1.20 mg/dL 0.76 0.89 0.86  Sodium 135 -  145 mEq/L 138 140 140  Potassium 3.5 - 5.1 mEq/L 4.3 4.7 4.3  Chloride 96 - 112 mEq/L 105 105 106  CO2 19 - 32 mEq/L 27 30 28   Calcium 8.4 - 10.5 mg/dL 9.0 8.9 9.1  Total Protein 6.0 - 8.3 g/dL 6.6 - -  Total Bilirubin 0.2 - 1.2 mg/dL 0.3 - -  Alkaline Phos 39 - 117 U/L 84 - -  AST 0 - 37 U/L 34 - -  ALT 0 - 35 U/L 29 - -    Pertinent Microbiology Results for orders placed or performed in visit on 04/01/20  Ova and parasite examination     Status: None   Collection Time: 04/01/20 11:54 AM   Specimen: Stool  Result Value Ref Range Status   MICRO NUMBER: [id number]  Final   SPECIMEN QUALITY: Adequate  Final   Source STOOL  Final   STATUS: FINAL  Final   CONCENTRATE RESULT: No ova or parasites seen  Final   TRICHROME RESULT: No ova or parasites seen  Final   COMMENT:   Final    Routine Ova and Parasite exam may not detect some parasites that occasionally cause diarrheal illness. Cryptosporidium Antigen and/or Cyclospora and Isospora Exam may be ordered to detect these parasites. One negative sample does not necessarily rule out  the presence of a parasitic infection.  For additional information, please refer to https://education.questdiagn[facility].com/faq/FAQ203 (This link is being provided for informational/ educational purposes only.)   Fecal leukocytes     Status: None   Collection Time: 04/01/20 11:54 AM   Specimen: Stool  Result Value Ref Range Status   Micro Number [id number]  Final   Specimen Quality Adequate  Final   Source STOOL  Final   Status FINAL  Final   Result Not Detected  Final    Comment: Reference Range:Not Detected   Pertinent Imaging All pertinent  labs/Imagings/notes reviewed. All pertinent plain films and CT images have been personally visualized and interpreted; radiology reports have been reviewed. Decision making incorporated into the Impression / Recommendations.  I have spent 35 minutes for this patient encounter including  review of prior medical records with greater than 50% of time in face to face counsel of the patient/discussing diagnostics and plan of care.   Electronically signed by:  Rosiland Oz, MD Infectious Disease Physician [facility] [address] [place], [place] [address] Phone: [phone]  Fax: [phone]

## 2020-12-11 ENCOUNTER — Other Ambulatory Visit: Payer: Self-pay

## 2020-12-11 ENCOUNTER — Telehealth: Payer: Self-pay

## 2020-12-11 DIAGNOSIS — A439 Nocardiosis, unspecified: Secondary | ICD-10-CM

## 2020-12-11 LAB — BASIC METABOLIC PANEL
BUN: 15 mg/dL (ref 7–25)
CO2: 27 mmol/L (ref 20–32)
Calcium: 9 mg/dL (ref 8.6–10.4)
Chloride: 105 mmol/L (ref 98–110)
Creat: 0.82 mg/dL (ref 0.60–1.00)
Glucose, Bld: 100 mg/dL — ABNORMAL HIGH (ref 65–99)
Potassium: 4.3 mmol/L (ref 3.5–5.3)
Sodium: 140 mmol/L (ref 135–146)

## 2020-12-11 NOTE — Telephone Encounter (Signed)
-----   Message from Rosiland Oz, MD sent at 12/10/2020  6:15 PM EDT ----- Regarding: Lab visit Could you please make a fu visit for lab draw ( BMP) in a month? It can be dome with a PCP also if she wishes.

## 2020-12-11 NOTE — Telephone Encounter (Signed)
Patient scheduled on 11/14 for lab visit for BMP. Patient aware and verbalized her understanding.    [place], CMA

## 2020-12-19 DIAGNOSIS — J479 Bronchiectasis, uncomplicated: Secondary | ICD-10-CM | POA: Diagnosis not present

## 2020-12-22 ENCOUNTER — Other Ambulatory Visit: Payer: Self-pay

## 2020-12-23 ENCOUNTER — Other Ambulatory Visit (HOSPITAL_COMMUNITY): Payer: Self-pay

## 2020-12-23 ENCOUNTER — Encounter: Payer: Self-pay | Admitting: Internal Medicine

## 2020-12-23 ENCOUNTER — Other Ambulatory Visit: Payer: Self-pay | Admitting: Allergy and Immunology

## 2020-12-23 MED ORDER — PAROXETINE HCL 30 MG PO TABS
ORAL_TABLET | ORAL | 1 refills | Status: DC
  Filled 2020-12-23: qty 90, 90d supply, fill #0

## 2020-12-23 MED ORDER — FAMOTIDINE 40 MG PO TABS
ORAL_TABLET | Freq: Every day | ORAL | 0 refills | Status: DC
  Filled 2020-12-23: qty 90, 90d supply, fill #0

## 2020-12-24 ENCOUNTER — Telehealth: Payer: Self-pay

## 2020-12-24 ENCOUNTER — Other Ambulatory Visit (INDEPENDENT_AMBULATORY_CARE_PROVIDER_SITE_OTHER): Payer: Medicare PPO

## 2020-12-24 DIAGNOSIS — E559 Vitamin D deficiency, unspecified: Secondary | ICD-10-CM | POA: Diagnosis not present

## 2020-12-24 DIAGNOSIS — R7989 Other specified abnormal findings of blood chemistry: Secondary | ICD-10-CM

## 2020-12-24 LAB — TSH: TSH: 1.32 u[IU]/mL (ref 0.35–5.50)

## 2020-12-24 NOTE — Telephone Encounter (Signed)
Patient called and stated she will schedule lab visit with [facility]

## 2020-12-25 ENCOUNTER — Encounter: Payer: Self-pay | Admitting: Internal Medicine

## 2020-12-25 ENCOUNTER — Other Ambulatory Visit (HOSPITAL_COMMUNITY): Payer: Self-pay

## 2020-12-25 ENCOUNTER — Other Ambulatory Visit: Payer: Self-pay

## 2020-12-25 ENCOUNTER — Other Ambulatory Visit: Payer: Self-pay | Admitting: Internal Medicine

## 2020-12-25 DIAGNOSIS — E559 Vitamin D deficiency, unspecified: Secondary | ICD-10-CM

## 2020-12-25 DIAGNOSIS — J849 Interstitial pulmonary disease, unspecified: Secondary | ICD-10-CM

## 2020-12-25 LAB — VITAMIN D 25 HYDROXY (VIT D DEFICIENCY, FRACTURES): VITD: 26.96 ng/mL — ABNORMAL LOW (ref 30.00–100.00)

## 2020-12-25 NOTE — Addendum Note (Signed)
Addended by: Caffie Pinto on: 12/25/2020 02:41 PM   Modules accepted: Orders

## 2020-12-25 NOTE — Telephone Encounter (Signed)
Spoke with patient. See lab result note. 

## 2020-12-26 ENCOUNTER — Other Ambulatory Visit (HOSPITAL_COMMUNITY): Payer: Self-pay

## 2020-12-26 MED ORDER — BUDESONIDE 3 MG PO CPEP
3.0000 mg | ORAL_CAPSULE | Freq: Every day | ORAL | 1 refills | Status: DC
  Filled 2020-12-26: qty 90, 90d supply, fill #0

## 2020-12-26 MED ORDER — BUDESONIDE 3 MG PO CPEP
ORAL_CAPSULE | ORAL | 0 refills | Status: AC
  Filled 2020-12-26: qty 90, 62d supply, fill #0

## 2020-12-26 NOTE — Telephone Encounter (Signed)
If I remember correctly, she was having very good response to low-dose budesonide at 3 mg/day.  Since she is planning on upcoming vacation, we can plan for the following:  - Provide Rx for budesonide 3 mg tablets.  Can start at 6 mg/day now, and if symptoms well controlled can decrease to 3 mg/day after 4 weeks.  Can plan to continue at 3 mg/day for the next 6 months, with plan to taper off again. - If suboptimal response to 6 mg/day, okay to increase to 9 mg/day x4 weeks then plan for taper. - Okay to use Imodium, Lomotil, etc. as needed, particularly if needed during upcoming travel - We sometimes consider using immunosuppressive therapy in patients requiring long courses/recurrent courses of budesonide.  However, this would have to be done quite carefully given underlying CVID and nocardia pulmonary infection.  Would instead be more inclined to continue budesonide up to 37-month course along with consideration of cholestyramine or bismuth.  This can be discussed in the office pending response to the above.  Hope she has a wonderful time on her upcoming cruise!

## 2020-12-27 LAB — AFB CULTURE WITH SMEAR (NOT AT ARMC)
Acid Fast Culture: NEGATIVE
Acid Fast Smear: NEGATIVE

## 2020-12-29 ENCOUNTER — Other Ambulatory Visit (HOSPITAL_COMMUNITY): Payer: Self-pay

## 2020-12-30 ENCOUNTER — Other Ambulatory Visit (HOSPITAL_COMMUNITY): Payer: Self-pay

## 2020-12-30 ENCOUNTER — Other Ambulatory Visit: Payer: Self-pay

## 2020-12-30 ENCOUNTER — Other Ambulatory Visit: Payer: Medicare PPO

## 2020-12-30 DIAGNOSIS — J849 Interstitial pulmonary disease, unspecified: Secondary | ICD-10-CM

## 2020-12-30 MED ORDER — METHOCARBAMOL 500 MG PO TABS
500.0000 mg | ORAL_TABLET | Freq: Three times a day (TID) | ORAL | 0 refills | Status: DC
  Filled 2020-12-30: qty 270, 90d supply, fill #0
  Filled 2021-05-08: qty 90, 30d supply, fill #1

## 2020-12-31 LAB — BASIC METABOLIC PANEL
BUN: 19 mg/dL (ref 7–25)
CO2: 28 mmol/L (ref 20–32)
Calcium: 8.9 mg/dL (ref 8.6–10.4)
Chloride: 107 mmol/L (ref 98–110)
Creat: 0.78 mg/dL (ref 0.60–1.00)
Glucose, Bld: 87 mg/dL (ref 65–99)
Potassium: 4.1 mmol/L (ref 3.5–5.3)
Sodium: 141 mmol/L (ref 135–146)

## 2021-01-18 DIAGNOSIS — J479 Bronchiectasis, uncomplicated: Secondary | ICD-10-CM | POA: Diagnosis not present

## 2021-01-19 NOTE — Progress Notes (Signed)
Office Visit Note  Patient: Beverly [name]             Date of Birth: 1945/08/21           MRN: [id number]             PCP: Isaac Bliss, Rayford Halsted, MD Referring: Dara Hoyer, FNP Visit Date: 01/20/2021 Occupation: Retired nurse  Subjective:  New Patient (Initial Visit) (Lower extremity pain)   History of Present Illness: Beverly [name] is a 75 y.o. female with medical history of CVID, bronchiectasis, Nocardia pneumonia, asthma, and lymphocytic colitis here for evaluation of pains and heaviness in bilateral legs.  She has longstanding history of chronic back pain also previous history of muscular and tendon inflammation of the gluteals and proximal legs followed with pain management.  However her current complaint is bilateral distal lower extremity pain that started abruptly in October.  Pain is localized to the lateral half of the calf on both legs no extension into the knee or to the ankle or below.  Pain is worse first thing in the morning and when starting to move from a prolonged resting position there is no increase in pain with weightbearing position or any range of motion.  She denies any radiation or extension into the foot or any loss of sensation.  She has not had any weakness or gait instability.  During this time there has been no comparable or new symptoms elsewhere.  She denies any swelling or visible changes or erythema.  Symptoms are responsive to the low-dose Norco when she takes this as needed.  Medical events around this time include trial of discontinuing the budesonide for lymphocytic colitis though she resumed this on a slower taper currently down to 3 daily.  She was also diagnosed with hypothyroidism with T4 of 0.48 but started on supplementation since October.   Labs reviewed 11/2020 TSH wnl T4 0.48 CBC Plts 117 Iron sat 13.2%  05/23/19 MRI Right hip IMPRESSION: 1. Moderate bilateral hip joint degenerative changes but no stress fracture or AVN. 2.  Moderate peritendinitis mainly involving the gluteus medius tendon but no tendon rupture. 3. Inflammation/edema involving the proximal vastus lateralis along its musculotendinous junction. This could be due to a muscle strain or partial tear. 4. No significant intrapelvic abnormalities.  Activities of Daily Living:  Patient reports morning stiffness for 45 minutes.   Patient Reports nocturnal pain.  Difficulty dressing/grooming: Denies Difficulty climbing stairs: Denies Difficulty getting out of chair: Denies Difficulty using hands for taps, buttons, cutlery, and/or writing: Reports  Review of Systems  Constitutional:  Positive for fatigue.  HENT:  Negative for mouth dryness.   Eyes:  Positive for dryness.  Respiratory:  Negative for shortness of breath.   Cardiovascular:  Negative for swelling in legs/feet.  Gastrointestinal:  Negative for constipation.  Endocrine: Positive for heat intolerance.  Genitourinary:  Negative for difficulty urinating.  Musculoskeletal:  Positive for joint pain, joint pain, joint swelling and morning stiffness.  Skin:  Negative for rash.  Allergic/Immunologic: Positive for susceptible to infections.  Neurological:  Negative for numbness.  Hematological:  Negative for bruising/bleeding tendency.  Psychiatric/Behavioral:  Negative for sleep disturbance.    PMFS History:  Patient Active Problem List   Diagnosis Date Noted   CVID (common variable immunodeficiency) ([place]) 11/29/2020   Bronchiectasis without complication ([place]) 51/70/0174   Lymphocytic colitis 11/29/2020   Pain in both lower extremities 11/29/2020   Hypothyroidism 11/22/2020   Vitamin D deficiency 11/21/2020  Allergies 08/28/2020   Hypogammaglobulinemia ([place]) 08/06/2020   Diarrhea 03/13/2020   Acid-fast bacteria present 02/20/2020   Carpal tunnel syndrome of left wrist 02/09/2020   Nocardia infection 01/11/2020   Medication monitoring encounter 01/11/2020   Pain of left hand  10/12/2019   OSA (obstructive sleep apnea) 07/31/2019   Chest pain 06/22/2019   Interstitial lung disease ([place]) 06/22/2019   S/P reverse total shoulder arthroplasty, right 04/11/2019   Aortic atherosclerosis ([place]) 04/10/2019   Brow ptosis 07/28/2018   Facial trauma 07/28/2018   Nasal fracture 07/28/2018   Tear of left rotator cuff 02/22/2018   Abnormal lung sounds 10/16/2016   Moderate persistent asthma without complication 67/01/4579   Transient visual loss of both eyes 03/16/2016   Amaurosis fugax 01/09/2015   Postmenopausal symptoms 11/26/2014   Left shoulder pain 10/02/2014   Right hip pain 09/05/2014   Unspecified asthma, with exacerbation 04/25/2012   LPRD (laryngopharyngeal reflux disease) 04/06/2011   Asthmatic bronchitis 04/06/2011   Allergic rhinitis 04/06/2011   Mixed hyperlipidemia 04/06/2011   Chronic back pain 04/06/2011   Cough 02/20/2011   Skin cancer 07/17/1977    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   right sholder   BCC (basal cell carcinoma of skin) 05/09/2014   mid lower back   BCC (basal cell carcinoma of skin) 05/03/2017   right low back   BCC (basal cell carcinoma of skin) 05/03/2017   left upper back   BCC (basal cell carcinoma of skin) 07/05/2018   left mid back   BCC (basal cell carcinoma of skin) 05/20/1992   upper back   BCC (basal cell carcinoma of skin) 07/29/1993   left sholder medial   BCC (basal cell carcinoma of skin) 07/29/1993   left sholder lateral   BCC (basal cell carcinoma of skin) 07/29/1993   right thigh   BCC (basal cell carcinoma of skin) 07/29/1993   right sholder   BCC (basal cell carcinoma of skin) 12/22/1994   right mid forearm   BCC (basal cell carcinoma of skin) 12/22/1994   right upper forearm   BCC (basal cell  carcinoma of skin) 12/22/1994   lower right upper arm   BCC (basal cell carcinoma of skin) 12/22/1994   right upper arm sholder   BCC (basal cell carcinoma of skin) 08/11/1995   left leg below knee   BCC (basal cell carcinoma of skin) 04/11/2002   mid back   BCC (basal cell carcinoma of skin) 12/10/2002   right center upper back   BCC (basal cell carcinoma of skin) 05/26/2005   right post sholder   BCC (basal cell carcinoma) 05/09/2014   left inner shin   BCC (basal cell carcinoma) 06/12/2014   left forearm   Bowen's disease 10/07/1994   right post knee, right inner forearm/wrist   Bowen's disease 08/11/1995   left sholder   Bronchiectasis ([place])    Cataract    left   Chronic pain    Depression    Diverticulosis of colon 1998   mild   DJD (degenerative joint disease)    Duodenal ulcer    h/o   GERD (gastroesophageal reflux disease)    History of SCC (squamous cell carcinoma) of skin    Dr. Denna Haggard   History  of sinus bradycardia    HLD (hyperlipidemia)    hypertriglyceridemia   Hypertensive retinopathy of both eyes 11/18/2017   Hypothyroidism    IBS (irritable bowel syndrome)    Internal hemorrhoids 1998   Lymphocytic colitis    Mitral regurgitation    mild   Nocardiosis    Osteoarthritis    feet,shoulder,neck,back,hips and hands.   Pneumonia    PONV (postoperative nausea and vomiting)    Rotator cuff tear, right 02/2019   infraspinatus and supraspinatus, and dislocation of long head of bicep tendon (Dr. Alvan Dame)   SCC (squamous cell carcinoma) 11/26/2014   left hand, right hand, right deltoid   SCC (squamous cell carcinoma) 05/03/2017   left cheek   Sleep apnea    uses a mouth guard nightly   Squamous cell carcinoma in situ (SCCIS) 07/05/2018   left hand   Tracheobronchomalacia    Vitamin D deficiency    mild    Family History  Problem Relation Age of Onset   Depression Mother    Schizophrenia Mother    Cancer Father        oral   Heart disease Father         bradycardia   Esophageal cancer Father    Cancer Sister        skin and lung   COPD Sister    Hypertension Sister    Osteoporosis Sister        compression fx's x 3 11/2018   Hyperthyroidism Brother    Cancer Brother        metastatic cancer to bone (?primary)   Diabetes Maternal Grandmother    Cancer Paternal Grandmother 40       colon cancer, metastatic to liver   Colon cancer Paternal Grandmother    Liver cancer Paternal Grandmother    Hyperlipidemia Daughter    Asthma Son    ADD / ADHD Other    Past Surgical History:  Procedure Laterality Date   ABDOMINAL HYSTERECTOMY     BLEPHAROPLASTY Bilateral 01/2018   BROW LIFT Bilateral 10/10/2018   Procedure: BROW LIFT;  Surgeon: Wallace Going, DO;  Location: [facility];  Service: Plastics;  Laterality: Bilateral;   BUNIONECTOMY     R 12/08, L 2004 (Dr. Janus Molder)   [place]   bilateral   CATARACT EXTRACTION Bilateral Left in 08/2012, Right 04/2016   Dr.Hecker   CESAREAN SECTION     x2   CHOLECYSTECTOMY  1992   CLOSED REDUCTION NASAL FRACTURE N/A 08/29/2018   Procedure: CLOSED REDUCTION NASAL FRACTURE;  Surgeon: Wallace Going, DO;  Location: [facility];  Service: Plastics;  Laterality: N/A;  1 hour, please   COLONOSCOPY  2006   COLONOSCOPY  01/2012   due again 01/2022; mild diverticulosis   epidural steroid injection, back  02/2010   HIP SURGERY     right bursectomy x 3   HIP SURGERY Right 2000   torn cartilage, repaired   INGUINAL HERNIA REPAIR  9/09   bilat   NECK SURGERY  1989   c6-7 cervical laminectomy and diskecotmy   NECK SURGERY  1989   per patient posterior area of neck   REVERSE SHOULDER ARTHROPLASTY Right 04/11/2019   Procedure: REVERSE SHOULDER ARTHROPLASTY;  Surgeon: Nicholes Stairs, MD;  Location: [facility];  Service: Orthopedics;  Laterality: Right;  [facility]   SHOULDER SURGERY  2/05   left rotator cuff repair   SHOULDER SURGERY  Left 03/14/2018  rotator cuff repair; Dr. 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   [place] for [employer] part-time   Immunization History  Administered Date(s) Administered   Fluad Quad(high Dose 65+) 11/10/2019   Hepatitis A 06/17/2006, 01/05/2007   IPV 04/15/2012   Influenza Split 10/31/2013   Influenza, High Dose Seasonal PF 11/16/2016, 11/16/2017, 11/01/2018   Influenza,inj,quad, With Preservative 11/16/2017, 12/12/2018   Influenza-Unspecified 11/09/2014, 11/14/2015, 10/25/2020   PFIZER Comirnaty(Gray Top)Covid-19 Tri-Sucrose Vaccine 03/22/2020   PFIZER(Purple Top)SARS-COV-2 Vaccination 02/05/2019, 02/25/2019, 10/19/2019   Pfizer Covid-19 Vaccine Bivalent Booster 78yrs & up 11/07/2020   Pneumococcal Conjugate-13 07/20/2013   Pneumococcal Polysaccharide-23 06/17/2006, 11/26/2014, 09/05/2019   Tdap 09/16/2004, 10/08/2014   Typhoid Live 06/17/2011, 06/10/2017   Unspecified SARS-COV-2 Vaccination 02/20/2019   Yellow Fever 07/08/2012   Zoster Recombinat (Shingrix) 04/28/2016, 08/15/2016   Zoster, Live 04/04/2009     Objective: Vital Signs: BP 126/65 (BP Location: Right Arm, Patient Position: Sitting, Cuff Size: Normal)   Pulse (!) 56   Resp 16   Ht 5\' 4"  (1.626 m)   Wt 155 lb (70.3 kg)   BMI 26.61 kg/m    Physical Exam HENT:     Right Ear: External ear normal.     Left Ear: External ear normal.     Mouth/Throat:     Mouth: Mucous membranes are moist.     Pharynx: Oropharynx is clear.  Eyes:     Conjunctiva/sclera: Conjunctivae normal.  Cardiovascular:      Rate and Rhythm: Normal rate and regular rhythm.  Pulmonary:     Effort: Pulmonary effort is normal.     Breath sounds: Normal breath sounds.  Musculoskeletal:     Right lower leg: No edema.     Left lower leg: No edema.  Skin:    General: Skin is warm and dry.     Findings: No rash.  Neurological:     General: No focal deficit present.     Mental Status: She is alert.     Sensory: No sensory deficit.     Deep Tendon Reflexes: Reflexes normal.  Psychiatric:        Mood and Affect: Mood normal.     Musculoskeletal Exam:  Shoulders full ROM no tenderness or swelling Elbows full ROM no tenderness or swelling Wrists full ROM no tenderness or swelling Fingers full ROM no tenderness or swelling No paraspinal tenderness to palpation over upper and lower back Hip normal internal and external rotation without pain, no tenderness to lateral hip palpation Knees full ROM no tenderness or swelling Ankles full ROM no tenderness or swelling  Investigation: No additional findings.  Imaging: No results found.  Recent Labs: Lab Results  Component Value Date   WBC 6.4 11/20/2020   HGB 10.6 (L) 11/20/2020   PLT 117.0 (L) 11/20/2020   NA 141 12/30/2020   K 4.1 12/30/2020   CL 107 12/30/2020   CO2 28 12/30/2020   GLUCOSE 87 12/30/2020   BUN 19 12/30/2020   CREATININE 0.78 12/30/2020   BILITOT 0.3 11/20/2020   ALKPHOS 84 11/20/2020   AST 34  11/20/2020   ALT 29 11/20/2020   PROT 6.6 11/20/2020   ALBUMIN 4.0 11/20/2020   CALCIUM 8.9 12/30/2020   GFRAA >60 06/22/2019    Speciality Comments: No specialty comments available.  Procedures:  No procedures performed Allergies: Adhesive [tape] and Codeine   Assessment / Plan:     Visit Diagnoses: Pain in both lower extremities - Plan: CK, Aldolase, C-reactive protein  Describe lower extremity pain is pretty atypical does not localize to the joints not associated with visible or palpable abnormalities.  If anything described areas  more over the lateral compartment of the distal leg.  Checking CK or aldolase for evidence of muscle inflammation.  Also CRP is additional inflammatory marker.  If work-up from today is unremarkable would recommend observation for now monitor for any progression of symptoms or new objective changes.  Chronic back pain, unspecified back location, unspecified back pain laterality  Chronic back pain is stable symptoms are not typical for radiculopathy with no radiation of symptoms no associated change in local problems of the back.  Lymphocytic colitis  Relatively new diagnosis currently doing well on the budesonide lymphocytic colitis much less strongly associated with spondyloarthropathy as compared to Crohn's or UC type inflammatory bowel disease.  Hypothyroidism  Mild now on thyroid hormone supplementation with improvement.  This can also be a cause of myopathy or myalgia but no other systemic signs for this as the clinical picture.  Orders: Orders Placed This Encounter  Procedures   CK   Aldolase   C-reactive protein    No orders of the defined types were placed in this encounter.    Follow-Up Instructions: No follow-ups on file.   Collier Salina, MD  Note - This record has been created using [employer].  Chart creation errors have been sought, but may not always  have been located. Such creation errors do not reflect on  the standard of medical care.

## 2021-01-20 ENCOUNTER — Other Ambulatory Visit (HOSPITAL_COMMUNITY): Payer: Self-pay

## 2021-01-20 ENCOUNTER — Other Ambulatory Visit: Payer: Self-pay

## 2021-01-20 ENCOUNTER — Encounter: Payer: Self-pay | Admitting: Internal Medicine

## 2021-01-20 ENCOUNTER — Ambulatory Visit: Payer: Medicare PPO | Admitting: Internal Medicine

## 2021-01-20 VITALS — BP 126/65 | HR 56 | Resp 16 | Ht 64.0 in | Wt 155.0 lb

## 2021-01-20 DIAGNOSIS — M549 Dorsalgia, unspecified: Secondary | ICD-10-CM | POA: Diagnosis not present

## 2021-01-20 DIAGNOSIS — M79605 Pain in left leg: Secondary | ICD-10-CM

## 2021-01-20 DIAGNOSIS — K52832 Lymphocytic colitis: Secondary | ICD-10-CM

## 2021-01-20 DIAGNOSIS — G8929 Other chronic pain: Secondary | ICD-10-CM

## 2021-01-20 DIAGNOSIS — M79604 Pain in right leg: Secondary | ICD-10-CM

## 2021-01-20 DIAGNOSIS — E039 Hypothyroidism, unspecified: Secondary | ICD-10-CM

## 2021-01-21 ENCOUNTER — Ambulatory Visit: Payer: Medicare PPO | Admitting: Dermatology

## 2021-01-21 LAB — C-REACTIVE PROTEIN: CRP: 2.5 mg/L (ref <8.0)

## 2021-01-21 LAB — CK: Total CK: 70 U/L (ref 29–143)

## 2021-01-21 LAB — ALDOLASE: Aldolase: 5.3 U/L (ref <=8.1)

## 2021-01-21 NOTE — Progress Notes (Signed)
Lab results are normal no evidence of muscle damage or inflammation. I do not recommend any particular new treatments at this time. No scheduled f/u needed. I would like to follow up as needed though if she has an increase or recurrent flare of symptoms.

## 2021-01-22 ENCOUNTER — Other Ambulatory Visit (HOSPITAL_COMMUNITY): Payer: Self-pay

## 2021-01-23 ENCOUNTER — Other Ambulatory Visit (HOSPITAL_COMMUNITY): Payer: Self-pay

## 2021-01-23 DIAGNOSIS — M150 Primary generalized (osteo)arthritis: Secondary | ICD-10-CM | POA: Diagnosis not present

## 2021-01-23 DIAGNOSIS — Z79891 Long term (current) use of opiate analgesic: Secondary | ICD-10-CM | POA: Diagnosis not present

## 2021-01-23 DIAGNOSIS — M47816 Spondylosis without myelopathy or radiculopathy, lumbar region: Secondary | ICD-10-CM | POA: Diagnosis not present

## 2021-01-23 DIAGNOSIS — G894 Chronic pain syndrome: Secondary | ICD-10-CM | POA: Diagnosis not present

## 2021-01-23 MED ORDER — BELBUCA 450 MCG BU FILM
ORAL_FILM | BUCCAL | 2 refills | Status: DC
  Filled 2021-01-23: qty 60, 30d supply, fill #0
  Filled 2021-03-11: qty 60, 30d supply, fill #1
  Filled 2021-04-10: qty 60, 30d supply, fill #2

## 2021-01-23 MED ORDER — HYDROCODONE-ACETAMINOPHEN 7.5-325 MG PO TABS
ORAL_TABLET | ORAL | 0 refills | Status: DC
  Filled 2021-01-23: qty 100, 25d supply, fill #0

## 2021-01-24 ENCOUNTER — Other Ambulatory Visit (HOSPITAL_COMMUNITY): Payer: Self-pay

## 2021-01-27 ENCOUNTER — Encounter: Payer: Self-pay | Admitting: Internal Medicine

## 2021-01-27 DIAGNOSIS — E559 Vitamin D deficiency, unspecified: Secondary | ICD-10-CM

## 2021-01-28 ENCOUNTER — Other Ambulatory Visit (HOSPITAL_COMMUNITY): Payer: Self-pay

## 2021-01-29 ENCOUNTER — Other Ambulatory Visit (HOSPITAL_COMMUNITY): Payer: Self-pay

## 2021-01-30 ENCOUNTER — Encounter: Payer: Self-pay | Admitting: Internal Medicine

## 2021-02-03 ENCOUNTER — Other Ambulatory Visit: Payer: Self-pay | Admitting: Internal Medicine

## 2021-02-03 ENCOUNTER — Other Ambulatory Visit (HOSPITAL_COMMUNITY): Payer: Self-pay

## 2021-02-03 DIAGNOSIS — E782 Mixed hyperlipidemia: Secondary | ICD-10-CM

## 2021-02-03 DIAGNOSIS — M545 Low back pain, unspecified: Secondary | ICD-10-CM | POA: Diagnosis not present

## 2021-02-03 DIAGNOSIS — M48062 Spinal stenosis, lumbar region with neurogenic claudication: Secondary | ICD-10-CM | POA: Diagnosis not present

## 2021-02-03 MED ORDER — PREDNISONE 5 MG (21) PO TBPK
ORAL_TABLET | ORAL | 1 refills | Status: DC
Start: 2021-02-03 — End: 2021-03-25
  Filled 2021-02-03: qty 21, 6d supply, fill #0

## 2021-02-03 NOTE — Telephone Encounter (Signed)
I spoke with Beverly [name] she visited orthopedics clinic this morning due to increasing pain in posterior and lateral legs xrays were checked apparently suggesting lumbar spine radiculopathy as a cause. She is scheduled for MRI early January and follow up with their office. She is taking a steroid dosepack for symptoms in the meantime. No particular follow up with our office needed unless new problem identified.

## 2021-02-04 ENCOUNTER — Other Ambulatory Visit: Payer: Self-pay

## 2021-02-04 ENCOUNTER — Other Ambulatory Visit (HOSPITAL_COMMUNITY): Payer: Self-pay

## 2021-02-04 ENCOUNTER — Ambulatory Visit: Payer: Medicare PPO | Admitting: Infectious Diseases

## 2021-02-04 VITALS — BP 134/69 | HR 59 | Temp 98.2°F | Ht 64.0 in | Wt 158.2 lb

## 2021-02-04 DIAGNOSIS — D801 Nonfamilial hypogammaglobulinemia: Secondary | ICD-10-CM | POA: Diagnosis not present

## 2021-02-04 DIAGNOSIS — A439 Nocardiosis, unspecified: Secondary | ICD-10-CM | POA: Diagnosis not present

## 2021-02-04 DIAGNOSIS — Z5181 Encounter for therapeutic drug level monitoring: Secondary | ICD-10-CM

## 2021-02-04 LAB — BASIC METABOLIC PANEL
BUN: 17 mg/dL (ref 7–25)
CO2: 28 mmol/L (ref 20–32)
Calcium: 9.3 mg/dL (ref 8.6–10.4)
Chloride: 107 mmol/L (ref 98–110)
Creat: 0.73 mg/dL (ref 0.60–1.00)
Glucose, Bld: 99 mg/dL (ref 65–99)
Potassium: 4.9 mmol/L (ref 3.5–5.3)
Sodium: 140 mmol/L (ref 135–146)

## 2021-02-04 MED ORDER — ATORVASTATIN CALCIUM 40 MG PO TABS
ORAL_TABLET | Freq: Every day | ORAL | 1 refills | Status: DC
  Filled 2021-02-04: qty 90, 90d supply, fill #0
  Filled 2021-04-16: qty 90, 90d supply, fill #1

## 2021-02-04 NOTE — Progress Notes (Addendum)
[facility]                                                             [place], [place], [place], [address]                                                                  Phn. [phone]; Fax: [phone]                                                                             Date: 02/04/21  Reason for Follow up: Nocardia pneumonia  Assessment Pulmonary nocardia infection ( 11/07/20 sputum AFB smear and cx negative ) Hypogammaglobulinemia- follows Allergist, On immunoglobulin injections every week  Lymphocytic colitis: started back on Budesonide and planned for a prolonged course Medication monitoring  Recommendations Continue  Bactrim 1 double strength tablet twice daily. She can finish the remaining pills in her bottle ( about 2/3 of bottle left) and then stop  BMP today and in 1 month ( can be done with PCP) Fu as needed   Follow up for management of GERD/Asthma/LPR and Hypogammaglobulinemia per Pulmonary and Immunology    All questions and concerns were discussed and addressed. Patient verbalized understanding of the plan. ____________________________________________________________________________________________________________________ Subjective/Interval events 12/10/20 Here for follow up for Pulmonary Nocardia infection. Has been taking Bactrim PO twice daily without any issues. She continues to have productive cough with at least 6 cups of greyish stuff every day. She has been following Pulmonary and is getting measured for a pulmonary vest this Monday. She feels her breathing is getting worse. She was getting SOB when she was chatting with her family members this past weekend and has to take pauses in between. She has a help come in for doing cleaning for house and yard work for a long time. She however is able to do laundry, indoor and some outdoor planting. She has  also completed her steroids course around end of September. She is also following with Allergy and Pulmonology. She had a sputum AFB smear and cx collected on 9/22 at Pulmonology office. She has also been referred to Rheumatology for evaluation of bilateral leg pain for possible autoimmune causes. She tells me the leg pain have improved from when seen with Allergy/Immunology but still present.   02/04/21 Here for follow up for Nocardia pna. She feels significantly better after being started on Bactrim. Cough is more effective and she coughs two times a day at the most and coughs up minimal clear phlegm. SOB is better. She is able to walk with her dog several blocks. However, gets SOB while walking and talking simultaneously. She does not climb stairs. She was also seen by Rheum for leg pain and nothing remarkable. She has been started  on 5 days course of prednisone by Ortho  for leg pain. xrays were checked apparently suggesting lumbar spine radiculopathy as a cause. She is scheduled for MRI early January and follow up with their office. Also taking budesonide for lymphpocytic colitis. Discussed with her about Nocardia prophylaxis after completing her treatment for Nocardia. She is not interested in prophylaxis.  She does not have any immunocompromising conditions or on immunocompromising meds ( except low dose budesonide for lymphocytic colitis and hypogammaglobulinemia which has been taken care with Immunoglobulin injections). She still has 2/3 of a bottle full of bactrim which she wants to complete as she feels the bactrim has really made a difference in her respiratory symptoms.   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   right sholder    BCC (basal cell carcinoma of skin) 05/09/2014   mid lower back   BCC (basal cell carcinoma of skin) 05/03/2017   right low back   BCC (basal cell carcinoma of skin) 05/03/2017   left upper back   BCC (basal cell carcinoma of skin) 07/05/2018   left mid back   BCC (basal cell carcinoma of skin) 05/20/1992   upper back   BCC (basal cell carcinoma of skin) 07/29/1993   left sholder medial   BCC (basal cell carcinoma of skin) 07/29/1993   left sholder lateral   BCC (basal cell carcinoma of skin) 07/29/1993   right thigh   BCC (basal cell carcinoma of skin) 07/29/1993   right sholder   BCC (basal cell carcinoma of skin) 12/22/1994   right mid forearm   BCC (basal cell carcinoma of skin) 12/22/1994   right upper forearm   BCC (basal cell carcinoma of skin) 12/22/1994   lower right upper arm   BCC (basal cell carcinoma of skin) 12/22/1994   right upper arm sholder   BCC (basal cell carcinoma of skin) 08/11/1995   left leg below knee   BCC (basal cell carcinoma of skin) 04/11/2002   mid back   BCC (basal cell carcinoma of skin) 12/10/2002   right center upper back   BCC (basal cell carcinoma of skin) 05/26/2005   right post sholder   BCC (basal cell carcinoma) 05/09/2014   left inner shin   BCC (basal cell carcinoma) 06/12/2014   left forearm   Bowen's disease 10/07/1994   right post knee, right inner forearm/wrist   Bowen's disease 08/11/1995   left sholder   Bronchiectasis ([place])    Cataract    left   Chronic pain    Depression    Diverticulosis of colon 1998   mild   DJD (degenerative joint disease)    Duodenal ulcer    h/o   GERD (gastroesophageal reflux disease)    History of SCC (squamous cell carcinoma) of skin    Dr. Denna Haggard   History of sinus bradycardia    HLD (hyperlipidemia)    hypertriglyceridemia   Hypertensive retinopathy of both eyes 11/18/2017   Hypothyroidism    IBS (irritable bowel syndrome)    Internal hemorrhoids 1998   Lymphocytic colitis     Mitral regurgitation    mild   Nocardiosis    Osteoarthritis  feet,shoulder,neck,back,hips and hands.   Pneumonia    PONV (postoperative nausea and vomiting)    Rotator cuff tear, right 02/2019   infraspinatus and supraspinatus, and dislocation of long head of bicep tendon (Dr. Alvan Dame)   SCC (squamous cell carcinoma) 11/26/2014   left hand, right hand, right deltoid   SCC (squamous cell carcinoma) 05/03/2017   left cheek   Sleep apnea    uses a mouth guard nightly   Squamous cell carcinoma in situ (SCCIS) 07/05/2018   left hand   Tracheobronchomalacia    Vitamin D deficiency    mild   Past Surgical History:  Procedure Laterality Date   ABDOMINAL HYSTERECTOMY     BLEPHAROPLASTY Bilateral 01/2018   BROW LIFT Bilateral 10/10/2018   Procedure: BROW LIFT;  Surgeon: Wallace Going, DO;  Location: [facility];  Service: Plastics;  Laterality: Bilateral;   BUNIONECTOMY     R 12/08, L 2004 (Dr. Janus Molder)   [place]   bilateral   CATARACT EXTRACTION Bilateral Left in 08/2012, Right 04/2016   Dr.Hecker   CESAREAN SECTION     x2   CHOLECYSTECTOMY  1992   CLOSED REDUCTION NASAL FRACTURE N/A 08/29/2018   Procedure: CLOSED REDUCTION NASAL FRACTURE;  Surgeon: Wallace Going, DO;  Location: [facility];  Service: Plastics;  Laterality: N/A;  1 hour, please   COLONOSCOPY  2006   COLONOSCOPY  01/2012   due again 01/2022; mild diverticulosis   epidural steroid injection, back  02/2010   HIP SURGERY     right bursectomy x 3   HIP SURGERY Right 2000   torn cartilage, repaired   INGUINAL HERNIA REPAIR  9/09   bilat   NECK SURGERY  1989   c6-7 cervical laminectomy and diskecotmy   NECK SURGERY  1989   per patient posterior area of neck   REVERSE SHOULDER ARTHROPLASTY Right 04/11/2019   Procedure: REVERSE SHOULDER ARTHROPLASTY;  Surgeon: Nicholes Stairs, MD;  Location: [facility];  Service: Orthopedics;  Laterality: Right;   Regional Block   SHOULDER SURGERY  2/05   left rotator cuff repair   SHOULDER SURGERY Left 03/14/2018   rotator cuff repair; Dr. Tonita Cong   TONSILLECTOMY  age 45   TOTAL ABDOMINAL HYSTERECTOMY W/ BILATERAL SALPINGOOPHORECTOMY  1997   fibroids   TROCHANTERIC BURSA EXCISION Right [place] EXTRACTION     Current Outpatient Medications on File Prior to Visit  Medication Sig Dispense Refill   acetaminophen (TYLENOL) 500 MG tablet Take 1,000 mg by mouth every 6 (six) hours as needed for moderate pain.      albuterol (PROVENTIL HFA;VENTOLIN HFA) 108 (90 Base) MCG/ACT inhaler Inhale 2 puffs into the lungs every 6 (six) hours as needed for wheezing or shortness of breath. 18 g 1   aspirin EC 81 MG tablet Take 81 mg by mouth daily.     atorvastatin (LIPITOR) 40 MG tablet Take 1 tablet by mouth daily. 90 tablet 1   budesonide (ENTOCORT EC) 3 MG 24 hr capsule Take 2 capsules (6 mg total) by mouth daily for 28 days, THEN 1 capsule (3 mg total) daily. 90 capsule 0   Buprenorphine HCl (BELBUCA) 450 MCG FILM Dissolve 1 film in the inside of cheek every twelve hours 60 Film 2   Calcium Carbonate-Vitamin D 600-200 MG-UNIT TABS Take 1 tablet by mouth 2 (two) times daily.     celecoxib (CELEBREX) 200 MG  capsule TAKE 1 CAPSULE BY MOUTH 2 TIMES DAILY 180 capsule 1   diclofenac Sodium (VOLTAREN) 1 % GEL Apply 2 g topically 4 (four) times daily as needed (joint pain).     Docusate Sodium 100 MG capsule 1 capsule daily     esomeprazole (NEXIUM) 40 MG capsule TAKE 1 CAPSULE BY MOUTH DAILY IN THE MORNING 90 capsule 2   famotidine (PEPCID) 40 MG tablet TAKE 1 TABLET BY MOUTH AT BEDTIME. 90 tablet 0   gabapentin (NEURONTIN) 800 MG tablet TAKE 1 TABLET BY MOUTH ONCE DAILY AT BEDTIME 90 tablet 2   GUAIFENESIN ER PO Take 400 mg by mouth 2 (two) times daily.     HYDROcodone-acetaminophen (NORCO) 7.5-325 MG tablet Take 1 tablet by mouth four times a day as needed for pain 100 tablet 0    HYDROcodone-acetaminophen (NORCO) 7.5-325 MG tablet Take 1 tablet by mouth four times a day as needed for pain 100 tablet 0   imiquimod (ALDARA) 5 % cream Apply to affected areas on Monday,Wednesday, and Friday as directed for 6 weeks- 8 weeks 12 each 0   Immune Globulin, Human,-klhw (XEMBIFY) 10 GM/50ML SOLN Xembify 10 gram/50 mL (20 %) subcutaneous solution  INJECT 10G SUBCUTANEOUSLY EVERY WEEK     ipratropium (ATROVENT) 0.03 % nasal spray Place 1 spray into both nostrils 3 (three) times daily. 30 mL 12   levothyroxine (SYNTHROID) 25 MCG tablet Take 1 tablet (25 mcg total) by mouth daily. 90 tablet 1   Melatonin-Pyridoxine (MELATIN PO)      methocarbamol (ROBAXIN) 500 MG tablet Take 1 tablet (500 mg total) by mouth 3 (three) times daily. 360 tablet 0   Misc Natural Products (FIBER 7 PO) 2 gummies daily     montelukast (SINGULAIR) 10 MG tablet TAKE 1 TABLET BY MOUTH AT BEDTIME. 90 tablet 1   Multiple Vitamins-Minerals (MULTIVITAMIN WITH MINERALS) tablet Take 1 tablet by mouth daily.     omega-3 acid ethyl esters (LOVAZA) 1 g capsule TAKE 2 CAPSULES BY MOUTH 2 TIMES DAILY 360 capsule 0   PARoxetine (PAXIL) 30 MG tablet TAKE 2 TABLETS BY MOUTH ONCE A DAY 180 tablet 1   PARoxetine (PAXIL) 30 MG tablet Take 1 tablet by mouth once a day. 90 tablet 1   predniSONE (STERAPRED UNI-PAK 21 TAB) 5 MG (21) TBPK tablet Take as directed on package 21 tablet 1   sulfamethoxazole-trimethoprim (BACTRIM DS) 800-160 MG tablet Take 1 tablet by mouth 2 (two) times daily. 180 tablet 1   Buprenorphine HCl (BELBUCA) 450 MCG FILM Place 1 film to inside of mouth every twelve hours 60 Film 2   [DISCONTINUED] potassium chloride (KLOR-CON) 10 MEQ tablet Take 2 tablets (total 20 MEQ) by mouth once 2 tablet 0   No current facility-administered medications on file prior to visit.     Allergies  Allergen Reactions   Adhesive [Tape] Rash   Codeine Rash   Social History   Socioeconomic History   Marital status: Divorced     Spouse name: Not on file   Number of children: 2   Years of education: Not on file   Highest education level: Not on file  Occupational History   Occupation: STAFF NURSE    Employer: [employer]  Tobacco Use   Smoking status: Never   Smokeless tobacco: Never  Vaping Use   Vaping Use: Never used  Substance and Sexual Activity   Alcohol use: Yes    Alcohol/week: 7.0 standard drinks    Types:  7 Glasses of wine per week   Drug use: No   Sexual activity: Not Currently  Other Topics Concern   Not on file  Social History Narrative   Divorced, lives alone, [place]; 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   [place] for [employer] part-time   Social Determinants of Health   Financial Resource Strain: Not on [employer] Insecurity: Not on file  Transportation Needs: Not on file  Physical Activity: Not on file  Stress: Not on file  Social Connections: Not on file  Intimate Partner Violence: Not At Risk   Fear of Current or Ex-Partner: No   Emotionally Abused: No   Physically Abused: No   Sexually Abused: No    Vitals BP 134/69    Pulse (!) 59    Temp 98.2 F (36.8 C)    Ht 5\' 4"  (1.626 m)    Wt 158 lb 3.2 oz (71.8 kg)    SpO2 96%    BMI 27.15 kg/m    Examination  General - not in acute distress, comfortably sitting in chair HEENT - no pallor and no icterus Chest - b/l clear air entry, no additional sounds CVS- Normal s1s2, RRR Abdomen - Soft, Non tender , non distended Ext- no pedal edema Neuro: grossly normal Psych : calm and cooperative   Recent labs CBC Latest Ref Rng & Units 11/20/2020 05/29/2020 05/06/2020  WBC 4.0 - 10.5 K/uL 6.4 4.6 5.2  Hemoglobin 12.0 - 15.0 g/dL 10.6(L) 10.6(L) 11.2(L)  Hematocrit 36.0 - 46.0 % 31.6(L) 33.6(L) 33.1(L)  Platelets 150.0 - 400.0 K/uL 117.0(L) 171 135(L)   CMP Latest Ref Rng & Units  12/30/2020 12/10/2020 11/20/2020  Glucose 65 - 99 mg/dL 87 100(H) 85  BUN 7 - 25 mg/dL 19 15 15   Creatinine 0.60 - 1.00 mg/dL 0.78 0.82 0.76  Sodium 135 - 146 mmol/L 141 140 138  Potassium 3.5 - 5.3 mmol/L 4.1 4.3 4.3  Chloride 98 - 110 mmol/L 107 105 105  CO2 20 - 32 mmol/L 28 27 27   Calcium 8.6 - 10.4 mg/dL 8.9 9.0 9.0  Total Protein 6.0 - 8.3 g/dL - - 6.6  Total Bilirubin 0.2 - 1.2 mg/dL - - 0.3  Alkaline Phos 39 - 117 U/L - - 84  AST 0 - 37 U/L - - 34  ALT 0 - 35 U/L - - 29    Pertinent Microbiology Results for orders placed or performed in visit on 11/07/20  AFB Culture & Smear     Status: None   Collection Time: 11/07/20  3:03 PM   Specimen: Sputum   Sputum  Result Value Ref Range Status   AFB Specimen Processing Concentration  Final   Acid Fast Smear Negative  Final   Acid Fast Culture Negative  Final    Comment: No acid fast bacilli isolated after 6 weeks.   Pertinent Imaging All pertinent labs/Imagings/notes reviewed. All pertinent plain films and CT images have been personally visualized and interpreted; radiology reports have been reviewed. Decision making incorporated into the Impression / Recommendations.  I have spent 35 minutes for this patient encounter including  review of prior medical records with greater than 50% of time in face to face counsel of the patient/discussing diagnostics and plan of care.   Electronically signed by:  Rosiland Oz, MD Infectious Disease Physician [facility] [address] [place], [place] [address] Phone: [phone]   Fax: [phone]

## 2021-02-05 ENCOUNTER — Encounter: Payer: Self-pay | Admitting: Infectious Diseases

## 2021-02-05 ENCOUNTER — Other Ambulatory Visit (HOSPITAL_COMMUNITY): Payer: Self-pay

## 2021-02-05 DIAGNOSIS — G5602 Carpal tunnel syndrome, left upper limb: Secondary | ICD-10-CM | POA: Diagnosis not present

## 2021-02-05 MED ORDER — GABAPENTIN 800 MG PO TABS
ORAL_TABLET | ORAL | 2 refills | Status: DC
  Filled 2021-02-05: qty 90, 90d supply, fill #0
  Filled 2021-05-08: qty 90, 90d supply, fill #1
  Filled 2021-08-01: qty 90, 90d supply, fill #2

## 2021-02-06 ENCOUNTER — Other Ambulatory Visit (INDEPENDENT_AMBULATORY_CARE_PROVIDER_SITE_OTHER): Payer: Medicare PPO

## 2021-02-06 ENCOUNTER — Other Ambulatory Visit (HOSPITAL_COMMUNITY): Payer: Self-pay

## 2021-02-06 ENCOUNTER — Other Ambulatory Visit: Payer: Self-pay | Admitting: Internal Medicine

## 2021-02-06 DIAGNOSIS — E559 Vitamin D deficiency, unspecified: Secondary | ICD-10-CM

## 2021-02-06 DIAGNOSIS — Z1231 Encounter for screening mammogram for malignant neoplasm of breast: Secondary | ICD-10-CM | POA: Diagnosis not present

## 2021-02-06 LAB — VITAMIN D 25 HYDROXY (VIT D DEFICIENCY, FRACTURES): VITD: 30.55 ng/mL (ref 30.00–100.00)

## 2021-02-06 LAB — HM MAMMOGRAPHY

## 2021-02-06 MED ORDER — VITAMIN D (ERGOCALCIFEROL) 1.25 MG (50000 UNIT) PO CAPS
50000.0000 [IU] | ORAL_CAPSULE | ORAL | 0 refills | Status: AC
  Filled 2021-02-06: qty 12, 84d supply, fill #0

## 2021-02-11 ENCOUNTER — Encounter: Payer: Self-pay | Admitting: Internal Medicine

## 2021-02-11 ENCOUNTER — Encounter: Payer: Self-pay | Admitting: Allergy and Immunology

## 2021-02-11 ENCOUNTER — Ambulatory Visit: Payer: Medicare PPO | Admitting: Allergy and Immunology

## 2021-02-11 ENCOUNTER — Other Ambulatory Visit: Payer: Self-pay

## 2021-02-11 VITALS — BP 142/72 | HR 59 | Temp 97.9°F | Resp 16 | Ht 64.0 in | Wt 155.6 lb

## 2021-02-11 DIAGNOSIS — K219 Gastro-esophageal reflux disease without esophagitis: Secondary | ICD-10-CM

## 2021-02-11 DIAGNOSIS — D839 Common variable immunodeficiency, unspecified: Secondary | ICD-10-CM | POA: Diagnosis not present

## 2021-02-11 DIAGNOSIS — A439 Nocardiosis, unspecified: Secondary | ICD-10-CM | POA: Diagnosis not present

## 2021-02-11 DIAGNOSIS — J479 Bronchiectasis, uncomplicated: Secondary | ICD-10-CM | POA: Diagnosis not present

## 2021-02-11 NOTE — Patient Instructions (Signed)
°  1.  Continue immunoglobulin infusions every week  2.  Continue to treat reflex / LPR:   A. Nexium 40 mg in AM  B. Famotidine 40 mg in PM   3.  Treat and prevent inflammation of upper airway:   A.  Montelukast 10 mg -1 tablet 1 time per day  B.  OTC Nasacort -1 spray each nostril 1 time per day  4. If needed:  A. Loratadine 10 mg 1 tablet 1 time per day  B. Ipratropium 0.06% - 2 sprays each nostril every 6 hours (4x/day)  5.  Return to clinic in 6 months or earlier if problem

## 2021-02-11 NOTE — Progress Notes (Signed)
[facility] [place] [facility]   Follow-up Note  Referring Provider: Isaac Bliss, [facility]* Primary Provider: Isaac Bliss, Rayford Halsted, MD Date of Office Visit: 02/11/2021  Subjective:   Beverly [name] (DOB: 1945-04-26) is a 75 y.o. female who returns to the [facility] on 02/11/2021 in re-evaluation of the following:  HPI: Kamirah returns to this clinic in reevaluation of CVID with bronchiectasis and history of Nocardia pneumonia, history of asthma, history of reflux, history of rhinitis, and history of lymphocytic colitis treated with oral budesonide.  Her last visit to this clinic with me was 01 October 2020.  She continues to do quite well regarding her infectious history.  She has not required an antibiotic to treat any type of infection.  She will be finishing up her Bactrim for her nocardia sometime in January.  She has not had any issues with her respiratory tract.  She continues to follow with Dr. Shearon Stalls regarding her bronchiectasis.  She has not used a short acting bronchodilator in a prolonged period in time.  Her reflux is under very good control at this point in time on her current plan of Nexium and famotidine.  She apparently had a bone density test performed which identified osteopenia and she has been using some vitamin D and calcium.  Her lymphocytic colitis is under very good control with 3 mg of budesonide per day.  There is a plan to apparently taper down her oral steroid dose at some point in the future.  She has received 5 COVID vaccines and this year's flu vaccine.  She is undergoing evaluation for spinal stenosis.  Allergies as of 02/11/2021       Reactions   Adhesive [tape] Rash   Codeine Rash        Medication List    acetaminophen 500 MG tablet Commonly known as: TYLENOL Take 1,000 mg by mouth every 6 (six) hours as needed for moderate pain.   albuterol 108 (90 Base) MCG/ACT inhaler Commonly  known as: VENTOLIN HFA Inhale 2 puffs into the lungs every 6 (six) hours as needed for wheezing or shortness of breath.   aspirin EC 81 MG tablet Take 81 mg by mouth daily.   atorvastatin 40 MG tablet Commonly known as: LIPITOR Take 1 tablet by mouth daily.   Belbuca 450 MCG Film Generic drug: Buprenorphine HCl Dissolve 1 film in the inside of cheek every twelve hours   Belbuca 450 MCG Film Generic drug: Buprenorphine HCl Place 1 film to inside of mouth every twelve hours   budesonide 3 MG 24 hr capsule Commonly known as: ENTOCORT EC Take 2 capsules (6 mg total) by mouth daily for 28 days, THEN 1 capsule (3 mg total) daily. Start taking on: December 26, 2020   Calcium Carbonate-Vitamin D 600-200 MG-UNIT Tabs Take 1 tablet by mouth 2 (two) times daily.   celecoxib 200 MG capsule Commonly known as: CELEBREX TAKE 1 CAPSULE BY MOUTH 2 TIMES DAILY   diclofenac Sodium 1 % Gel Commonly known as: VOLTAREN Apply 2 g topically 4 (four) times daily as needed (joint pain).   Docusate Sodium 100 MG capsule 1 capsule daily   esomeprazole 40 MG capsule Commonly known as: NEXIUM TAKE 1 CAPSULE BY MOUTH DAILY IN THE MORNING   famotidine 40 MG tablet Commonly known as: PEPCID TAKE 1 TABLET BY MOUTH AT BEDTIME.   FIBER 7 PO 2 gummies daily   gabapentin 800 MG tablet Commonly known as:  NEURONTIN TAKE 1 TABLET BY MOUTH ONCE DAILY AT BEDTIME   gabapentin 800 MG tablet Commonly known as: NEURONTIN Take 1 tablet by mouth at bedtime.   GUAIFENESIN ER PO Take 400 mg by mouth 2 (two) times daily.   HYDROcodone-acetaminophen 7.5-325 MG tablet Commonly known as: NORCO Take 1 tablet by mouth four times a day as needed for pain   HYDROcodone-acetaminophen 7.5-325 MG tablet Commonly known as: NORCO Take 1 tablet by mouth four times a day as needed for pain   imiquimod 5 % cream Commonly known as: Aldara Apply to affected areas on Monday,Wednesday, and Friday as directed for 6  weeks- 8 weeks   ipratropium 0.03 % nasal spray Commonly known as: ATROVENT Place 1 spray into both nostrils 3 (three) times daily.   levothyroxine 25 MCG tablet Commonly known as: SYNTHROID Take 1 tablet (25 mcg total) by mouth daily.   MELATIN PO   methocarbamol 500 MG tablet Commonly known as: ROBAXIN Take 1 tablet (500 mg total) by mouth 3 (three) times daily.   montelukast 10 MG tablet Commonly known as: SINGULAIR TAKE 1 TABLET BY MOUTH AT BEDTIME.   multivitamin with minerals tablet Take 1 tablet by mouth daily.   omega-3 acid ethyl esters 1 g capsule Commonly known as: LOVAZA TAKE 2 CAPSULES BY MOUTH 2 TIMES DAILY   PARoxetine 30 MG tablet Commonly known as: PAXIL TAKE 2 TABLETS BY MOUTH ONCE A DAY   PARoxetine 30 MG tablet Commonly known as: PAXIL Take 1 tablet by mouth once a day.   predniSONE 5 MG (21) Tbpk tablet Commonly known as: STERAPRED UNI-PAK 21 TAB Take as directed on package   sulfamethoxazole-trimethoprim 800-160 MG tablet Commonly known as: BACTRIM DS Take 1 tablet by mouth 2 (two) times daily.   Vitamin D (Ergocalciferol) 1.25 MG (50000 UNIT) Caps capsule Commonly known as: DRISDOL Take 1 capsule by mouth every 7 days for 12 doses.   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   right sholder   BCC (basal cell carcinoma of skin) 05/09/2014   mid lower back   BCC (basal cell carcinoma of skin) 05/03/2017   right low back   BCC (basal cell carcinoma of skin) 05/03/2017   left upper back   BCC (basal cell carcinoma of skin) 07/05/2018   left mid back   BCC (basal cell carcinoma of skin)  05/20/1992   upper back   BCC (basal cell carcinoma of skin) 07/29/1993   left sholder medial   BCC (basal cell carcinoma of skin) 07/29/1993   left sholder lateral   BCC (basal cell carcinoma of skin) 07/29/1993   right thigh   BCC (basal cell carcinoma of skin) 07/29/1993   right sholder   BCC (basal cell carcinoma of skin) 12/22/1994   right mid forearm   BCC (basal cell carcinoma of skin) 12/22/1994   right upper forearm   BCC (basal cell carcinoma of skin) 12/22/1994   lower right upper arm   BCC (basal cell carcinoma of skin) 12/22/1994   right upper arm sholder   BCC (basal cell carcinoma of  skin) 08/11/1995   left leg below knee   BCC (basal cell carcinoma of skin) 04/11/2002   mid back   BCC (basal cell carcinoma of skin) 12/10/2002   right center upper back   BCC (basal cell carcinoma of skin) 05/26/2005   right post sholder   BCC (basal cell carcinoma) 05/09/2014   left inner shin   BCC (basal cell carcinoma) 06/12/2014   left forearm   Bowen's disease 10/07/1994   right post knee, right inner forearm/wrist   Bowen's disease 08/11/1995   left sholder   Bronchiectasis ([place])    Cataract    left   Chronic pain    Depression    Diverticulosis of colon 1998   mild   DJD (degenerative joint disease)    Duodenal ulcer    h/o   GERD (gastroesophageal reflux disease)    History of SCC (squamous cell carcinoma) of skin    Dr. Denna Haggard   History of sinus bradycardia    HLD (hyperlipidemia)    hypertriglyceridemia   Hypertensive retinopathy of both eyes 11/18/2017   Hypothyroidism    IBS (irritable bowel syndrome)    Internal hemorrhoids 1998   Lymphocytic colitis    Mitral regurgitation    mild   Nocardiosis    Osteoarthritis    feet,shoulder,neck,back,hips and hands.   Pneumonia    PONV (postoperative nausea and vomiting)    Rotator cuff tear, right 02/2019   infraspinatus and supraspinatus, and dislocation of long head of bicep tendon (Dr. Alvan Dame)   SCC  (squamous cell carcinoma) 11/26/2014   left hand, right hand, right deltoid   SCC (squamous cell carcinoma) 05/03/2017   left cheek   Sleep apnea    uses a mouth guard nightly   Squamous cell carcinoma in situ (SCCIS) 07/05/2018   left hand   Tracheobronchomalacia    Vitamin D deficiency    mild    Past Surgical History:  Procedure Laterality Date   ABDOMINAL HYSTERECTOMY     BLEPHAROPLASTY Bilateral 01/2018   BROW LIFT Bilateral 10/10/2018   Procedure: BROW LIFT;  Surgeon: Wallace Going, DO;  Location: [facility];  Service: Plastics;  Laterality: Bilateral;   BUNIONECTOMY     R 12/08, L 2004 (Dr. Janus Molder)   [place]   bilateral   CATARACT EXTRACTION Bilateral Left in 08/2012, Right 04/2016   Dr.Hecker   CESAREAN SECTION     x2   CHOLECYSTECTOMY  1992   CLOSED REDUCTION NASAL FRACTURE N/A 08/29/2018   Procedure: CLOSED REDUCTION NASAL FRACTURE;  Surgeon: Wallace Going, DO;  Location: [facility];  Service: Plastics;  Laterality: N/A;  1 hour, please   COLONOSCOPY  2006   COLONOSCOPY  01/2012   due again 01/2022; mild diverticulosis   epidural steroid injection, back  02/2010   HIP SURGERY     right bursectomy x 3   HIP SURGERY Right 2000   torn cartilage, repaired   INGUINAL HERNIA REPAIR  9/09   bilat   NECK SURGERY  1989   c6-7 cervical laminectomy and diskecotmy   NECK SURGERY  1989   per patient posterior area of neck   REVERSE SHOULDER ARTHROPLASTY Right 04/11/2019   Procedure: REVERSE SHOULDER ARTHROPLASTY;  Surgeon: Nicholes Stairs, MD;  Location: [facility];  Service: Orthopedics;  Laterality: Right;  [facility] Block   SHOULDER SURGERY  2/05   left rotator cuff repair   SHOULDER SURGERY Left 03/14/2018  rotator cuff repair; Dr. Tonita Cong   TONSILLECTOMY  age 56   TOTAL ABDOMINAL HYSTERECTOMY W/ BILATERAL SALPINGOOPHORECTOMY  1997   fibroids   TROCHANTERIC BURSA EXCISION Right [place] EXTRACTION      Review of systems negative except as noted in HPI / PMHx or noted below:  Review of Systems  Constitutional: Negative.   HENT: Negative.    Eyes: Negative.   Respiratory: Negative.    Cardiovascular: Negative.   Gastrointestinal: Negative.   Genitourinary: Negative.   Musculoskeletal: Negative.   Skin: Negative.   Neurological: Negative.   Endo/Heme/Allergies: Negative.   Psychiatric/Behavioral: Negative.      Objective:   Vitals:   02/11/21 1536  BP: (!) 142/72  Pulse: (!) 59  Resp: 16  Temp: 97.9 F (36.6 C)  SpO2: 98%   Height: 5\' 4"  (162.6 cm)  Weight: 155 lb 9.6 oz (70.6 kg)   Physical Exam Constitutional:      Appearance: She is not diaphoretic.  HENT:     Head: Normocephalic.     Right Ear: Tympanic membrane, ear canal and external ear normal.     Left Ear: Tympanic membrane, ear canal and external ear normal.     Nose: Nose normal. No mucosal edema or rhinorrhea.     Mouth/Throat:     Pharynx: Uvula midline. No oropharyngeal exudate.  Eyes:     Conjunctiva/sclera: Conjunctivae normal.  Neck:     Thyroid: No thyromegaly.     Trachea: Trachea normal. No tracheal tenderness or tracheal deviation.  Cardiovascular:     Rate and Rhythm: Normal rate and regular rhythm.     Heart sounds: Normal heart sounds, S1 normal and S2 normal. No murmur heard. Pulmonary:     Effort: No respiratory distress.     Breath sounds: Normal breath sounds. No stridor. No wheezing or rales.  Lymphadenopathy:     Head:     Right side of head: No tonsillar adenopathy.     Left side of head: No tonsillar adenopathy.     Cervical: No cervical adenopathy.  Skin:    Findings: No erythema or rash.     Nails: There is no clubbing.  Neurological:     Mental Status: She is alert.    Diagnostics: Results of blood tests obtained 17 September 2020 identified serum IgG 948 mg/DL  Assessment and Plan:   1. CVID (common variable immunodeficiency)  ([place])   2. Bronchiectasis without complication ([place])   3. Nocardia infection   4. LPRD (laryngopharyngeal reflux disease)     1.  Continue immunoglobulin infusions every week  2.  Continue to treat reflex / LPR:   A. Nexium 40 mg in AM  B. Famotidine 40 mg in PM   3.  Treat and prevent inflammation of upper airway:   A.  Montelukast 10 mg -1 tablet 1 time per day  B.  OTC Nasacort -1 spray each nostril 1 time per day  4. If needed:  A. Loratadine 10 mg 1 tablet 1 time per day  B. Ipratropium 0.06% - 2 sprays each nostril every 6 hours (4x/day)  5.  Return to clinic in 6 months or earlier if problem  Overall Fia is really doing quite well while utilizing immunoglobin infusions every week and therapy directed against her LPR and therapy directed against respiratory tract inflammation with a leukotriene modifier and nasal steroid.  She will continue on this plan and we will  see her back in this clinic in 6 months or earlier if there is a problem.  Allena Katz, MD Allergy / Immunology [facility]

## 2021-02-12 ENCOUNTER — Encounter: Payer: Self-pay | Admitting: Allergy and Immunology

## 2021-02-12 ENCOUNTER — Other Ambulatory Visit (HOSPITAL_COMMUNITY): Payer: Self-pay

## 2021-02-12 MED ORDER — FAMOTIDINE 40 MG PO TABS
ORAL_TABLET | Freq: Every day | ORAL | 2 refills | Status: DC
  Filled 2021-02-12: qty 90, fill #0
  Filled 2021-03-27: qty 90, 90d supply, fill #0
  Filled 2021-07-23: qty 90, 90d supply, fill #1

## 2021-02-12 MED ORDER — MONTELUKAST SODIUM 10 MG PO TABS
ORAL_TABLET | Freq: Every day | ORAL | 2 refills | Status: DC
  Filled 2021-02-12: qty 90, fill #0
  Filled 2021-04-10: qty 90, 90d supply, fill #0
  Filled 2021-08-01: qty 90, 90d supply, fill #1

## 2021-02-12 MED ORDER — LORATADINE 10 MG PO TABS
10.0000 mg | ORAL_TABLET | Freq: Every day | ORAL | 5 refills | Status: DC
  Filled 2021-02-12: qty 30, 30d supply, fill #0

## 2021-02-12 MED ORDER — IPRATROPIUM BROMIDE 0.03 % NA SOLN
1.0000 | Freq: Three times a day (TID) | NASAL | 12 refills | Status: DC
  Filled 2021-02-12: qty 30, 57d supply, fill #0

## 2021-02-12 MED ORDER — ESOMEPRAZOLE MAGNESIUM 40 MG PO CPDR
DELAYED_RELEASE_CAPSULE | Freq: Every morning | ORAL | 2 refills | Status: DC
  Filled 2021-02-12: qty 90, fill #0
  Filled 2021-04-09: qty 90, 90d supply, fill #0

## 2021-02-13 ENCOUNTER — Encounter: Payer: Self-pay | Admitting: Allergy and Immunology

## 2021-02-17 ENCOUNTER — Other Ambulatory Visit (HOSPITAL_COMMUNITY): Payer: Self-pay

## 2021-02-18 ENCOUNTER — Ambulatory Visit: Payer: Medicare PPO | Admitting: Internal Medicine

## 2021-02-18 DIAGNOSIS — M545 Low back pain, unspecified: Secondary | ICD-10-CM | POA: Diagnosis not present

## 2021-02-18 DIAGNOSIS — J479 Bronchiectasis, uncomplicated: Secondary | ICD-10-CM | POA: Diagnosis not present

## 2021-02-25 ENCOUNTER — Encounter: Payer: Self-pay | Admitting: Internal Medicine

## 2021-02-25 ENCOUNTER — Ambulatory Visit: Payer: Medicare PPO | Admitting: Dermatology

## 2021-02-26 ENCOUNTER — Other Ambulatory Visit (HOSPITAL_COMMUNITY): Payer: Self-pay

## 2021-02-28 ENCOUNTER — Other Ambulatory Visit (HOSPITAL_COMMUNITY): Payer: Self-pay

## 2021-03-03 ENCOUNTER — Other Ambulatory Visit: Payer: Self-pay

## 2021-03-03 ENCOUNTER — Encounter (HOSPITAL_BASED_OUTPATIENT_CLINIC_OR_DEPARTMENT_OTHER): Payer: Self-pay | Admitting: Orthopedic Surgery

## 2021-03-03 DIAGNOSIS — M5451 Vertebrogenic low back pain: Secondary | ICD-10-CM | POA: Diagnosis not present

## 2021-03-03 DIAGNOSIS — M48061 Spinal stenosis, lumbar region without neurogenic claudication: Secondary | ICD-10-CM | POA: Diagnosis not present

## 2021-03-03 NOTE — Progress Notes (Signed)
Spoke w/ via phone for pre-op interview---pt Lab needs dos----  none             Lab results------see below COVID test -----patient states asymptomatic no test needed Arrive at -------900 am 03-06-2021 NPO after MN NO Solid Food.  Clear liquids from MN until---800 am Med rec completed Medications to take morning of surgery -----use bring albuterol inhaler prn, nexium, atrovent nasal spray, celebrex, levothyroxine, methocarbamol, paxil Diabetic medication -----n/a Patient instructed no nail polish to be worn day of surgery Patient instructed to bring photo id and insurance card day of surgery Patient aware to have Driver (ride ) / caregiver    for 24 hours after surgery  jo cov friend will drop pt off Patient Special Instructions -----pt last dose of belbuca was 03-03-2021 in am Pre-Op special Istructions -----pt last dose of 81 mg aspirin to be day before surgery on 03-05-2021 Patient verbalized understanding of instructions that were given at this phone interview. Patient denies shortness of breath, chest pain, fever, cough , pt denies any cardiac s & s and states can lie flat if needed with pillows under knees, at this phone interview.   Infectious disease relased with telephone note 02-05-2021 epic, lov infectious disease 11-30-2020 dr s Alphonsa Gin for nocardia infection  Pulmonary lov 08-27-2020 dr n desai epic Pt uses dental appliance for mild osa Sleep study 08-02-2019 epic  [place] cardiology dr  Edmonia James for chest pain 07-06-2019, pt states no chest pain since then f/u prn  Echo 06-23-2019 epic  Lov asthma/allergy dr Carmelina Peal 02-11-2021 epic for common variable immunodeficiency

## 2021-03-03 NOTE — Discharge Instructions (Signed)
  Orthopaedic Hand Surgery Discharge Instructions  WEIGHT BEARING STATUS: Non weight bearing on operative extremity  DRESSING CARE: Please keep your dressing/splint/cast clean and dry until your follow-up appointment. You may shower by placing a waterproof covering over your dressing/splint/cast. Contact your surgeon if your splint/cast gets wet. It will need to be changed to prevent skin breakdown.  PAIN CONTROL: First line medications for post operative pain control are Tylenol (acetaminophen) and Motrin (ibuprofen) if you are able to take these medications. If you have been prescribed a medication these can be taken as breakthrough pain medications. Please note that some narcotic pain medication has acetaminophen added and you should never consume more than 4,000mg of acetaminophen in 24-hour period. Please note that if you are given Toradol (ketorolac) you should not take similar medications such as ibuprofen or naproxen.  DISCHARGE MEDICATIONS: If you have been prescribed medication it was sent electronically to your pharmacy. No changes have been made to your home medications.  ICE/ELEVATION: Ice and elevate your injured extremity as needed. Avoid direct contact of ice with skin.   BANDAGE FEELS TOO TIGHT: If your bandage feels too tight, first make sure you are elevating your fingers as much as possible. The outer layer of the bandage can be unwrapped and reapplied more loosely. If no improvement, you may carefully cut the inner layer longitudinally until the pressure has resolved and then rewrap the outer layer. If you are not comfortable with these instructions, please call the office and the bandage can be changed for you.   FOLLOW UP: You will be called after surgery with an appointment date and time, however if you have not received a phone call within 3 days, please call during regular office hours at [phone] to schedule a post operative appointment.  Please Seek Medical Attention  if: Call MD for: pain or pressure in chest, jaw, arm, back, neck  Call MD for: temperature greater than 101 F for more than 24 hrs Call MD for: difficulty breathing Call MD for: incision redness, bleeding, drainage  Call MD for: palpitations or feeling that the heart is racing  Call MD for: increased swelling in arm, leg, ankle, or abdomen  Call MD for: lightheadedness, dizziness, fainting Call 911 or go to ER for any medical emergency if you are not able to get in touch with your doctor   J. Reid Ilir Mahrt, MD Orthopaedic Hand Surgeon [facility] Office number: [phone] [address] [place], [place] [address]  

## 2021-03-03 NOTE — H&P (Signed)
Preoperative History & Physical Exam  Surgeon: Matt Holmes, MD  Diagnosis: Left Recurrent Carpal Tunnel Syndrome  Planned Procedure: Procedure(s) (LRB): Left Revision Carpal Tunnel Release with hypothenar fat pad flap (Left)  History of Present Illness:   Patient is a 76 y.o. female with symptoms consistent with Left Recurrent Carpal Tunnel Syndrome who presents for surgical intervention. The risks, benefits and alternatives of surgical intervention were discussed and informed consent was obtained prior to surgery.  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   right sholder   BCC (basal cell carcinoma of skin) 05/09/2014   mid lower back   BCC (basal cell carcinoma of skin) 05/03/2017   right low back   BCC (basal cell carcinoma of skin) 05/03/2017   left upper back   BCC (basal cell carcinoma of skin) 07/05/2018   left mid back   BCC (basal cell carcinoma of skin) 05/20/1992   upper back   BCC (basal cell carcinoma of skin) 07/29/1993   left sholder medial   BCC (basal cell carcinoma of skin) 07/29/1993   left sholder lateral   BCC (basal cell carcinoma of skin) 07/29/1993   right thigh   BCC (basal cell carcinoma of skin) 07/29/1993   right sholder   BCC (basal cell carcinoma of skin) 12/22/1994   right mid forearm   BCC (basal cell carcinoma of skin) 12/22/1994   right upper forearm   BCC (basal cell carcinoma of skin) 12/22/1994   lower right upper arm   BCC (basal cell carcinoma of skin) 12/22/1994   right upper arm sholder   BCC (basal cell carcinoma of skin) 08/11/1995   left leg below knee   BCC (basal cell carcinoma of skin) 04/11/2002   mid back   BCC (basal cell carcinoma of skin) 12/10/2002   right center upper back   BCC (basal cell  carcinoma of skin) 05/26/2005   right post sholder   BCC (basal cell carcinoma) 05/09/2014   left inner shin   BCC (basal cell carcinoma) 06/12/2014   left forearm   Bowen's disease 10/07/1994   right post knee, right inner forearm/wrist   Bowen's disease 08/11/1995   left sholder   Bronchiectasis ([place])    Cataract    left   Chronic pain    Depression    Diverticulosis of colon 1998   mild   DJD (degenerative joint disease)    Duodenal ulcer    h/o   GERD (gastroesophageal reflux disease)    History of SCC (squamous cell carcinoma) of skin    Dr. Denna Haggard   History of sinus bradycardia    HLD (hyperlipidemia)    hypertriglyceridemia   Hypertensive retinopathy of both eyes 11/18/2017   Hypothyroidism    IBS (irritable bowel syndrome)    Internal hemorrhoids 1998   Lymphocytic colitis    Mitral regurgitation    mild   Nocardiosis    Osteoarthritis    feet,shoulder,neck,back,hips and hands.   Pneumonia    PONV (postoperative nausea and vomiting)    Rotator cuff tear, right 02/2019   infraspinatus and supraspinatus, and dislocation of long head of bicep tendon (Dr. Alvan Dame)   SCC (squamous cell carcinoma) 11/26/2014   left hand, right hand, right  deltoid   SCC (squamous cell carcinoma) 05/03/2017   left cheek   Sleep apnea    uses a mouth guard nightly   Squamous cell carcinoma in situ (SCCIS) 07/05/2018   left hand   Tracheobronchomalacia    Vitamin D deficiency    mild    Past Surgical History:  Past Surgical History:  Procedure Laterality Date   ABDOMINAL HYSTERECTOMY     BLEPHAROPLASTY Bilateral 01/2018   BROW LIFT Bilateral 10/10/2018   Procedure: BROW LIFT;  Surgeon: Wallace Going, DO;  Location: [facility];  Service: Plastics;  Laterality: Bilateral;   BUNIONECTOMY     R 12/08, L 2004 (Dr. Janus Molder)   [place]   bilateral   CATARACT EXTRACTION Bilateral Left in 08/2012, Right 04/2016   Dr.Hecker   CESAREAN SECTION      x2   CHOLECYSTECTOMY  1992   CLOSED REDUCTION NASAL FRACTURE N/A 08/29/2018   Procedure: CLOSED REDUCTION NASAL FRACTURE;  Surgeon: Wallace Going, DO;  Location: [facility];  Service: Plastics;  Laterality: N/A;  1 hour, please   COLONOSCOPY  2006   COLONOSCOPY  01/2012   due again 01/2022; mild diverticulosis   epidural steroid injection, back  02/2010   HIP SURGERY     right bursectomy x 3   HIP SURGERY Right 2000   torn cartilage, repaired   INGUINAL HERNIA REPAIR  9/09   bilat   NECK SURGERY  1989   c6-7 cervical laminectomy and diskecotmy   NECK SURGERY  1989   per patient posterior area of neck   REVERSE SHOULDER ARTHROPLASTY Right 04/11/2019   Procedure: REVERSE SHOULDER ARTHROPLASTY;  Surgeon: Nicholes Stairs, MD;  Location: [facility];  Service: Orthopedics;  Laterality: Right;  Regional Block   SHOULDER SURGERY  2/05   left rotator cuff repair   SHOULDER SURGERY Left 03/14/2018   rotator cuff repair; Dr. Tonita Cong   TONSILLECTOMY  age 71   TOTAL ABDOMINAL HYSTERECTOMY W/ BILATERAL SALPINGOOPHORECTOMY  1997   fibroids   TROCHANTERIC BURSA EXCISION Right 1978   TUBAL LIGATION  1986   WISDOM TOOTH EXTRACTION      Medications:  Prior to Admission medications   Medication Sig Start Date End Date Taking? Authorizing Provider  acetaminophen (TYLENOL) 500 MG tablet Take 1,000 mg by mouth every 6 (six) hours as needed for moderate pain.     [provider]  albuterol (PROVENTIL HFA;VENTOLIN HFA) 108 (90 Base) MCG/ACT inhaler Inhale 2 puffs into the lungs every 6 (six) hours as needed for wheezing or shortness of breath. 07/02/16   Rita Ohara, MD  aspirin EC 81 MG tablet Take 81 mg by mouth daily.    [provider]  atorvastatin (LIPITOR) 40 MG tablet Take 1 tablet by mouth daily. 02/04/21 02/04/22  Isaac Bliss, Rayford Halsted, MD  Buprenorphine HCl (BELBUCA) 450 MCG FILM Place 1 film to inside of mouth every twelve hours 01/23/21      Calcium Carbonate-Vitamin D 600-200 MG-UNIT TABS Take 1 tablet by mouth 2 (two) times daily.    [provider]  celecoxib (CELEBREX) 200 MG capsule TAKE 1 CAPSULE BY MOUTH 2 TIMES DAILY 10/29/20 10/29/21  Isaac Bliss, Rayford Halsted, MD  diclofenac Sodium (VOLTAREN) 1 % GEL Apply 2 g topically 4 (four) times daily as needed (joint pain). 12/29/18   [provider]  Docusate Sodium 100 MG capsule 1 capsule daily 09/27/20   [provider]  esomeprazole (  NEXIUM) 40 MG capsule TAKE 1 CAPSULE BY MOUTH DAILY IN THE MORNING 02/12/21 02/12/22  Kozlow, Donnamarie Poag, MD  famotidine (PEPCID) 40 MG tablet TAKE 1 TABLET BY MOUTH AT BEDTIME. 02/12/21 02/12/22  Kozlow, Donnamarie Poag, MD  gabapentin (NEURONTIN) 800 MG tablet Take 1 tablet by mouth at bedtime. 02/05/21     GUAIFENESIN ER PO Take 400 mg by mouth 2 (two) times daily.    [provider]  HYDROcodone-acetaminophen (NORCO) 7.5-325 MG tablet Take 1 tablet by mouth four times a day as needed for pain 01/23/21     imiquimod (ALDARA) 5 % cream Apply to affected areas on Monday,Wednesday, and Friday as directed for 6 weeks- 8 weeks 10/09/20   Lavonna Monarch, MD  Immune Globulin, Human,-klhw (XEMBIFY) 10 GM/50ML SOLN Xembify 10 gram/50 mL (20 %) subcutaneous solution  INJECT 10G SUBCUTANEOUSLY EVERY WEEK    [provider]  ipratropium (ATROVENT) 0.03 % nasal spray Place 1 spray into both nostrils 3 times daily. 02/12/21   Kozlow, Donnamarie Poag, MD  levothyroxine (SYNTHROID) 25 MCG tablet Take 1 tablet (25 mcg total) by mouth daily. 11/22/20   Isaac Bliss, Rayford Halsted, MD  loratadine (CLARITIN) 10 MG tablet Take 1 tablet by mouth daily. 02/12/21   Kozlow, Donnamarie Poag, MD  Melatonin-Pyridoxine (MELATIN PO)  02/13/20   [provider]  methocarbamol (ROBAXIN) 500 MG tablet Take 1 tablet (500 mg total) by mouth 3 (three) times daily. 12/30/20     Misc Natural Products (FIBER 7 PO) 2 gummies daily 09/27/20   [provider]   montelukast (SINGULAIR) 10 MG tablet TAKE 1 TABLET BY MOUTH AT BEDTIME. 02/12/21 04/13/21  Kozlow, Donnamarie Poag, MD  Multiple Vitamins-Minerals (MULTIVITAMIN WITH MINERALS) tablet Take 1 tablet by mouth daily.    [provider]  omega-3 acid ethyl esters (LOVAZA) 1 g capsule TAKE 2 CAPSULES BY MOUTH 2 TIMES DAILY 12/10/20 12/10/21  Isaac Bliss, Rayford Halsted, MD  PARoxetine (PAXIL) 30 MG tablet Take 1 tablet by mouth once a day. 12/23/20     predniSONE (STERAPRED UNI-PAK 21 TAB) 5 MG (21) TBPK tablet Take as directed on package 02/03/21     Vitamin D, Ergocalciferol, (DRISDOL) 1.25 MG (50000 UNIT) CAPS capsule Take 1 capsule by mouth every 7 days for 12 doses. 02/06/21 05/02/21  Isaac Bliss, Rayford Halsted, MD  potassium chloride (KLOR-CON) 10 MEQ tablet Take 2 tablets (total 20 MEQ) by mouth once 03/29/20 04/16/20  Rosiland Oz, MD    Allergies:  Adhesive [tape] and Codeine  Review of Systems: Negative except per HPI.  Physical Exam: Alert and oriented, NAD Head and neck: no masses, normal alignment CV: pulse intact Pulm: no increased work of breathing, respirations even and unlabored Abdomen: non-distended Extremities: extremities warm and well perfused  LABS: Recent Results (from the past 2160 hour(s))  Basic metabolic panel     Status: Abnormal   Collection Time: 12/10/20  2:59 PM  Result Value Ref Range   Glucose, Bld 100 (H) 65 - 99 mg/dL    Comment: .            Fasting reference interval . For someone without known diabetes, a glucose value between 100 and 125 mg/dL is consistent with prediabetes and should be confirmed with a follow-up test. .    BUN 15 7 - 25 mg/dL   Creat 0.82 0.60 - 1.00 mg/dL   BUN/Creatinine Ratio NOT APPLICABLE 6 - 22 (calc)   Sodium 140 135 - 146 mmol/L  Potassium 4.3 3.5 - 5.3 mmol/L   Chloride 105 98 - 110 mmol/L   CO2 27 20 - 32 mmol/L   Calcium 9.0 8.6 - 10.4 mg/dL  VITAMIN D 25 Hydroxy (Vit-D Deficiency, Fractures)     Status:  Abnormal   Collection Time: 12/24/20  4:44 PM  Result Value Ref Range   VITD 26.96 (L) 30.00 - 100.00 ng/mL  TSH     Status: None   Collection Time: 12/24/20  4:44 PM  Result Value Ref Range   TSH 1.32 0.35 - 5.50 uIU/mL  Basic Metabolic Panel (BMET)     Status: None   Collection Time: 12/30/20  9:42 AM  Result Value Ref Range   Glucose, Bld 87 65 - 99 mg/dL    Comment: .            Fasting reference interval .    BUN 19 7 - 25 mg/dL   Creat 0.78 0.60 - 1.00 mg/dL   BUN/Creatinine Ratio NOT APPLICABLE 6 - 22 (calc)   Sodium 141 135 - 146 mmol/L   Potassium 4.1 3.5 - 5.3 mmol/L   Chloride 107 98 - 110 mmol/L   CO2 28 20 - 32 mmol/L   Calcium 8.9 8.6 - 10.4 mg/dL  CK     Status: None   Collection Time: 01/20/21 10:47 AM  Result Value Ref Range   Total CK 70 29 - 143 U/L  Aldolase     Status: None   Collection Time: 01/20/21 10:47 AM  Result Value Ref Range   Aldolase 5.3 < OR = 8.1 U/L  C-reactive protein     Status: None   Collection Time: 01/20/21 10:47 AM  Result Value Ref Range   CRP 2.5 <8.0 mg/L  Basic metabolic panel     Status: None   Collection Time: 02/04/21 11:22 AM  Result Value Ref Range   Glucose, Bld 99 65 - 99 mg/dL    Comment: .            Fasting reference interval .    BUN 17 7 - 25 mg/dL   Creat 0.73 0.60 - 1.00 mg/dL   BUN/Creatinine Ratio NOT APPLICABLE 6 - 22 (calc)   Sodium 140 135 - 146 mmol/L   Potassium 4.9 3.5 - 5.3 mmol/L   Chloride 107 98 - 110 mmol/L   CO2 28 20 - 32 mmol/L   Calcium 9.3 8.6 - 10.4 mg/dL  HM MAMMOGRAPHY     Status: None   Collection Time: 02/06/21 12:00 AM  Result Value Ref Range   HM Mammogram 0-4 Bi-Rad 0-4 Bi-Rad, Self Reported Normal  VITAMIN D 25 Hydroxy (Vit-D Deficiency, Fractures)     Status: None   Collection Time: 02/06/21 11:11 AM  Result Value Ref Range   VITD 30.55 30.00 - 100.00 ng/mL     Complete History and Physical exam available in the office notes  Beverly [name]

## 2021-03-05 ENCOUNTER — Telehealth: Payer: Self-pay | Admitting: Internal Medicine

## 2021-03-05 DIAGNOSIS — M4310 Spondylolisthesis, site unspecified: Secondary | ICD-10-CM | POA: Insufficient documentation

## 2021-03-05 DIAGNOSIS — M545 Low back pain, unspecified: Secondary | ICD-10-CM | POA: Insufficient documentation

## 2021-03-05 DIAGNOSIS — M5451 Vertebrogenic low back pain: Secondary | ICD-10-CM | POA: Diagnosis not present

## 2021-03-05 DIAGNOSIS — M48061 Spinal stenosis, lumbar region without neurogenic claudication: Secondary | ICD-10-CM | POA: Insufficient documentation

## 2021-03-05 NOTE — Telephone Encounter (Signed)
Patient brought in paperwork that she needs Dr.Hernandez to complete. Paperwork would be placed in the folder.  Paperwork could be faxed to [facility].  Please advise.

## 2021-03-06 ENCOUNTER — Other Ambulatory Visit (HOSPITAL_COMMUNITY): Payer: Self-pay

## 2021-03-06 ENCOUNTER — Encounter (HOSPITAL_BASED_OUTPATIENT_CLINIC_OR_DEPARTMENT_OTHER)
Admission: RE | Disposition: A | Discharge status: Home / Self Care | Payer: Self-pay | Source: Home / Self Care | Attending: Orthopedic Surgery

## 2021-03-06 ENCOUNTER — Ambulatory Visit (HOSPITAL_BASED_OUTPATIENT_CLINIC_OR_DEPARTMENT_OTHER): Payer: Medicare PPO | Admitting: Anesthesiology

## 2021-03-06 ENCOUNTER — Encounter (HOSPITAL_BASED_OUTPATIENT_CLINIC_OR_DEPARTMENT_OTHER): Payer: Self-pay | Admitting: Orthopedic Surgery

## 2021-03-06 ENCOUNTER — Ambulatory Visit (HOSPITAL_BASED_OUTPATIENT_CLINIC_OR_DEPARTMENT_OTHER)
Admission: RE | Admit: 2021-03-06 | Discharge: 2021-03-06 | Disposition: A | Discharge status: Home / Self Care | Payer: Medicare PPO | Attending: Orthopedic Surgery | Admitting: Orthopedic Surgery

## 2021-03-06 ENCOUNTER — Other Ambulatory Visit: Payer: Self-pay

## 2021-03-06 DIAGNOSIS — G5602 Carpal tunnel syndrome, left upper limb: Secondary | ICD-10-CM | POA: Diagnosis not present

## 2021-03-06 DIAGNOSIS — E039 Hypothyroidism, unspecified: Secondary | ICD-10-CM | POA: Diagnosis not present

## 2021-03-06 DIAGNOSIS — G4733 Obstructive sleep apnea (adult) (pediatric): Secondary | ICD-10-CM | POA: Diagnosis not present

## 2021-03-06 HISTORY — DX: Common variable immunodeficiency, unspecified: D83.9

## 2021-03-06 HISTORY — PX: CARPAL TUNNEL RELEASE: SHX101

## 2021-03-06 SURGERY — CARPAL TUNNEL RELEASE
Anesthesia: Monitor Anesthesia Care | Site: Hand | Laterality: Left

## 2021-03-06 MED ORDER — HYDROMORPHONE HCL 1 MG/ML IJ SOLN: 0.2500 mg | INTRAMUSCULAR | Status: DC | PRN

## 2021-03-06 MED ORDER — LIDOCAINE HCL (PF) 2 % IJ SOLN
INTRAMUSCULAR | Status: AC
  Filled 2021-03-06: qty 5

## 2021-03-06 MED ORDER — PROPOFOL 10 MG/ML IV BOLUS
INTRAVENOUS | Status: AC
  Filled 2021-03-06: qty 20

## 2021-03-06 MED ORDER — ACETAMINOPHEN 500 MG PO TABS
1000.0000 mg | ORAL_TABLET | Freq: Once | ORAL | Status: AC
  Administered 2021-03-06: 1000 mg via ORAL

## 2021-03-06 MED ORDER — LACTATED RINGERS IV SOLN: INTRAVENOUS | Status: DC

## 2021-03-06 MED ORDER — OXYCODONE HCL 5 MG PO TABS: 5.0000 mg | ORAL_TABLET | Freq: Once | ORAL | Status: DC | PRN

## 2021-03-06 MED ORDER — ACETAMINOPHEN 500 MG PO TABS
ORAL_TABLET | ORAL | Status: AC
  Filled 2021-03-06: qty 2

## 2021-03-06 MED ORDER — LIDOCAINE HCL (CARDIAC) PF 100 MG/5ML IV SOSY
PREFILLED_SYRINGE | INTRAVENOUS | Status: DC | PRN
  Administered 2021-03-06: 30 mg via INTRAVENOUS

## 2021-03-06 MED ORDER — ONDANSETRON HCL 4 MG/2ML IJ SOLN: 4.0000 mg | Freq: Once | INTRAMUSCULAR | Status: DC | PRN

## 2021-03-06 MED ORDER — 0.9 % SODIUM CHLORIDE (POUR BTL) OPTIME
TOPICAL | Status: DC | PRN
  Administered 2021-03-06: 500 mL

## 2021-03-06 MED ORDER — PROPOFOL 500 MG/50ML IV EMUL
INTRAVENOUS | Status: DC | PRN
  Administered 2021-03-06: 150 ug/kg/min via INTRAVENOUS

## 2021-03-06 MED ORDER — OXYCODONE HCL 5 MG/5ML PO SOLN: 5.0000 mg | Freq: Once | ORAL | Status: DC | PRN

## 2021-03-06 MED ORDER — PHENYLEPHRINE 40 MCG/ML (10ML) SYRINGE FOR IV PUSH (FOR BLOOD PRESSURE SUPPORT)
PREFILLED_SYRINGE | INTRAVENOUS | Status: AC
  Filled 2021-03-06: qty 10

## 2021-03-06 MED ORDER — CEFAZOLIN SODIUM-DEXTROSE 2-4 GM/100ML-% IV SOLN
2.0000 g | INTRAVENOUS | Status: AC
  Administered 2021-03-06: 2 g via INTRAVENOUS

## 2021-03-06 MED ORDER — HYDROCODONE-ACETAMINOPHEN 5-325 MG PO TABS
1.0000 | ORAL_TABLET | Freq: Four times a day (QID) | ORAL | 0 refills | Status: AC | PRN
  Filled 2021-03-06: qty 10, 3d supply, fill #0

## 2021-03-06 MED ORDER — CEFAZOLIN SODIUM-DEXTROSE 2-4 GM/100ML-% IV SOLN
INTRAVENOUS | Status: AC
  Filled 2021-03-06: qty 100

## 2021-03-06 MED ORDER — PROPOFOL 10 MG/ML IV BOLUS
INTRAVENOUS | Status: DC | PRN
  Administered 2021-03-06: 40 mg via INTRAVENOUS

## 2021-03-06 MED ORDER — LIDOCAINE HCL (PF) 1 % IJ SOLN
INTRAMUSCULAR | Status: DC | PRN
  Administered 2021-03-06: 10 mL

## 2021-03-06 MED ORDER — BUPIVACAINE HCL (PF) 0.5 % IJ SOLN
INTRAMUSCULAR | Status: DC | PRN
  Administered 2021-03-06: 10 mL

## 2021-03-06 MED ORDER — AMISULPRIDE (ANTIEMETIC) 5 MG/2ML IV SOLN: 10.0000 mg | Freq: Once | INTRAVENOUS | Status: DC | PRN

## 2021-03-06 SURGICAL SUPPLY — 28 items
BLADE SURG 15 STRL LF DISP TIS (BLADE) ×1 IMPLANT
BLADE SURG 15 STRL SS (BLADE) ×2
BNDG CMPR 9X4 STRL LF SNTH (GAUZE/BANDAGES/DRESSINGS) ×1
BNDG ELASTIC 4X5.8 VLCR STR LF (GAUZE/BANDAGES/DRESSINGS) ×2 IMPLANT
BNDG ESMARK 4X9 LF (GAUZE/BANDAGES/DRESSINGS) ×2 IMPLANT
COVER BACK TABLE 60X90IN (DRAPES) ×2 IMPLANT
CUFF TOURN SGL QUICK 24 (TOURNIQUET CUFF) ×2
CUFF TRNQT CYL 24X4X16.5-23 (TOURNIQUET CUFF) ×1 IMPLANT
DRAPE EXTREMITY T 121X128X90 (DISPOSABLE) ×2 IMPLANT
GAUZE 4X4 16PLY ~~LOC~~+RFID DBL (SPONGE) ×2 IMPLANT
GAUZE SPONGE 4X4 12PLY STRL (GAUZE/BANDAGES/DRESSINGS) ×2 IMPLANT
GAUZE XEROFORM 1X8 LF (GAUZE/BANDAGES/DRESSINGS) ×2 IMPLANT
GOWN STRL REUS W/ TWL LRG LVL3 (GOWN DISPOSABLE) ×1 IMPLANT
GOWN STRL REUS W/TWL LRG LVL3 (GOWN DISPOSABLE) ×2
HIBICLENS CHG 4% 4OZ BTL (MISCELLANEOUS) ×2 IMPLANT
KIT TURNOVER CYSTO (KITS) ×2 IMPLANT
KNIFE CARPAL TUNNEL (BLADE) ×2 IMPLANT
NEEDLE HYPO 22GX1.5 SAFETY (NEEDLE) ×2 IMPLANT
NS IRRIG 500ML POUR BTL (IV SOLUTION) ×2 IMPLANT
PACK BASIN DAY SURGERY FS (CUSTOM PROCEDURE TRAY) ×2 IMPLANT
PAD CAST 4YDX4 CTTN HI CHSV (CAST SUPPLIES) ×1 IMPLANT
PADDING CAST COTTON 4X4 STRL (CAST SUPPLIES) ×2
SUT ETHILON 4 0 PS 2 18 (SUTURE) ×2 IMPLANT
SYR 10ML LL (SYRINGE) ×2 IMPLANT
SYR BULB EAR ULCER 3OZ GRN STR (SYRINGE) ×2 IMPLANT
TOWEL OR 17X26 10 PK STRL BLUE (TOWEL DISPOSABLE) ×2 IMPLANT
TRAY DSU PREP LF (CUSTOM PROCEDURE TRAY) ×2 IMPLANT
UNDERPAD 30X36 HEAVY ABSORB (UNDERPADS AND DIAPERS) ×2 IMPLANT

## 2021-03-06 NOTE — Transfer of Care (Signed)
Immediate Anesthesia Transfer of Care Note  Patient: Beverly [name]  Procedure(s) Performed: Left Revision Carpal Tunnel Release with hypothenar fat pad flap (Left: Hand)  Patient Location: PACU  Anesthesia Type:MAC  Level of Consciousness: awake, alert  and oriented  Airway & Oxygen Therapy: Patient Spontanous Breathing  Post-op Assessment: Report given to RN and Post -op Vital signs reviewed and stable  Post vital signs: Reviewed and stable  Last Vitals:  Vitals Value Taken Time  BP    Temp    Pulse    Resp    SpO2      Last Pain:  Vitals:   03/06/21 0931  TempSrc: Oral         Complications: No notable events documented.

## 2021-03-06 NOTE — Anesthesia Postprocedure Evaluation (Signed)
Anesthesia Post Note  Patient: Beverly [name]  Procedure(s) Performed: Left Revision Carpal Tunnel Release with hypothenar fat pad flap (Left: Hand)     Patient location during evaluation: PACU Anesthesia Type: MAC Level of consciousness: awake and alert Pain management: pain level controlled Vital Signs Assessment: post-procedure vital signs reviewed and stable Respiratory status: spontaneous breathing, nonlabored ventilation and respiratory function stable Cardiovascular status: blood pressure returned to baseline and stable Postop Assessment: no apparent nausea or vomiting Anesthetic complications: no   No notable events documented.  Last Vitals:  Vitals:   03/06/21 1116 03/06/21 1127  BP: (!) 105/48 121/78  Pulse: (!) 54 62  Resp: 14   Temp: 36.5 C   SpO2: 94% 95%    Last Pain:  Vitals:   03/06/21 1127  TempSrc:   PainSc: 0-No pain                 Pervis Hocking

## 2021-03-06 NOTE — Op Note (Signed)
OPERATIVE NOTE  DATE OF PROCEDURE: 03/06/2021  SURGEONS:  Primary: [name], MD  PREOPERATIVE DIAGNOSIS: Left Recurrent Carpal Tunnel Syndrome  POSTOPERATIVE DIAGNOSIS: Same  NAME OF PROCEDURE:     Left revision carpal tunnel release   Left hand hypothenar fat flap  ANESTHESIA: Monitor Anesthesia Care + Local  SKIN PREPARATION: Hibiclens  ESTIMATED BLOOD LOSS: Minimal  IMPLANTS: none  INDICATIONS:  Beverly [name] is a 76 y.o. female who has the above preoperative diagnosis. The patient has decided to proceed with surgical intervention.  Risks, benefits and alternatives of operative management were discussed including, but not limited to, risks of anesthesia complications, infection, pain, persistent symptoms, stiffness, need for future surgery.  The patient understands, agrees and elects to proceed with surgery.    DESCRIPTION OF PROCEDURE: The patient was met in the pre-operative area and their identity was verified.  The operative location and laterality was also verified and marked.  The patient was brought to the OR and was placed supine on the table.  After repeat patient identification with the operative team anesthesia was provided and the patient was prepped and draped in the usual sterile fashion.  A final timeout was performed verifying the correction patient, procedure, location and laterality.  Preoperative antibiotics were provided, tourniquet inflated and then a longitudinal incision was made in the palm a distal of the wrist crease.  This incision was made in line with the previous surgical incision through the mid aspect of the hand.  Incision extended from the wrist crease to the Kaplan's cardinal line.  Skin and subcutaneous tissues were divided  and hemostasis obtained with bipolar electrocautery.  There was a significant amount of scar tissue over the median nerve with reformulation of the transverse carpal ligament.  This was divided completely from proximal to distal.   The safeguard was placed over the median nerve and the distal forearm fascia was also released with the carpal tunnel knife.  This time attention was turned to the hypothenar fat flap.  The hypothenar adipose tissue was mobilized with 15 blade scalpel on its neurovascular pedicle.  This adipose fascial flap was mobilized radially with blunt dissection via tenotomy scissors.  This time it was secured into place to the radial leaflet of the carpal tunnel with a 4-0 Vicryl suture.  There is excellent coverage of the median nerve and no tension on the repair.  Wound was thoroughly irrigated and closed with 4-0 horizontal mattress nylon sutures.  Sterile soft bandage was applied followed by sterile soft bandage.  The patient tolerated the procedure well.  All counts were correct x2.  The tourniquet was deflated and the fingers were pink and warm and well-perfused.  The patient was awoken from anesthesia and brought to PACU for recovery in stable condition.   Matt Holmes, MD

## 2021-03-06 NOTE — Anesthesia Preprocedure Evaluation (Addendum)
Anesthesia Evaluation  Patient identified by MRN, date of birth, ID band Patient awake    Reviewed: Allergy & Precautions, NPO status , Patient's Chart, lab work & pertinent test results  History of Anesthesia Complications (+) PONV and history of anesthetic complications  Airway Mallampati: I  TM Distance: >3 FB Neck ROM: Full    Dental  (+) Teeth Intact, Dental Advisory Given   Pulmonary asthma , sleep apnea (dental appliance, OSA is mild per pt) ,    Pulmonary exam normal breath sounds clear to auscultation       Cardiovascular negative cardio ROS Normal cardiovascular exam Rhythm:Regular Rate:Normal     Neuro/Psych PSYCHIATRIC DISORDERS Depression negative neurological ROS     GI/Hepatic Neg liver ROS, PUD, GERD  Medicated and Controlled,  Endo/Other  Hypothyroidism   Renal/GU negative Renal ROS  negative genitourinary   Musculoskeletal  (+) Arthritis , Osteoarthritis,  Chronic back pain    Abdominal   Peds negative pediatric ROS (+)  Hematology CVID    Anesthesia Other Findings Chronic pain- on buprenorphine, celebrex, hydrocodone 7.5/325, robaxin, gabapentin, voltaren gel  Prednisone-   Reproductive/Obstetrics negative OB ROS                           Anesthesia Physical Anesthesia Plan  ASA: 3  Anesthesia Plan: MAC   Post-op Pain Management:    Induction:   PONV Risk Score and Plan: 2 and Propofol infusion and TIVA  Airway Management Planned: Natural Airway and Simple Face Mask  Additional Equipment: None  Intra-op Plan:   Post-operative Plan:   Informed Consent: I have reviewed the patients History and Physical, chart, labs and discussed the procedure including the risks, benefits and alternatives for the proposed anesthesia with the patient or authorized representative who has indicated his/her understanding and acceptance.       Plan Discussed with:  CRNA  Anesthesia Plan Comments:         Anesthesia Quick Evaluation

## 2021-03-06 NOTE — Interval H&P Note (Signed)
History and Physical Interval Note:  03/06/2021 10:09 AM  Beverly [name]  has presented today for surgery, with the diagnosis of Left Recurrent Carpal Tunnel Syndrome.  The various methods of treatment have been discussed with the patient and family. After consideration of risks, benefits and other options for treatment, the patient has consented to  Procedure(s) with comments: Left Revision Carpal Tunnel Release with hypothenar fat pad flap (Left) - with local anesthesia as a surgical intervention.  The patient's history has been reviewed, patient examined, no change in status, stable for surgery.  I have reviewed the patient's chart and labs.  Questions were answered to the patient's satisfaction.     [name]

## 2021-03-07 NOTE — Telephone Encounter (Signed)
Last OV: 11/20/20 - preventative exam Forms placed on PCPs desk for completion

## 2021-03-10 ENCOUNTER — Encounter (HOSPITAL_BASED_OUTPATIENT_CLINIC_OR_DEPARTMENT_OTHER): Payer: Self-pay | Admitting: Orthopedic Surgery

## 2021-03-11 ENCOUNTER — Other Ambulatory Visit: Payer: Self-pay

## 2021-03-11 ENCOUNTER — Ambulatory Visit: Payer: Medicare PPO | Admitting: Dermatology

## 2021-03-11 ENCOUNTER — Other Ambulatory Visit (HOSPITAL_COMMUNITY): Payer: Self-pay

## 2021-03-11 DIAGNOSIS — Z1283 Encounter for screening for malignant neoplasm of skin: Secondary | ICD-10-CM

## 2021-03-11 DIAGNOSIS — C44519 Basal cell carcinoma of skin of other part of trunk: Secondary | ICD-10-CM | POA: Diagnosis not present

## 2021-03-11 DIAGNOSIS — D0461 Carcinoma in situ of skin of right upper limb, including shoulder: Secondary | ICD-10-CM | POA: Diagnosis not present

## 2021-03-11 DIAGNOSIS — L820 Inflamed seborrheic keratosis: Secondary | ICD-10-CM | POA: Diagnosis not present

## 2021-03-11 DIAGNOSIS — D489 Neoplasm of uncertain behavior, unspecified: Secondary | ICD-10-CM

## 2021-03-11 DIAGNOSIS — L821 Other seborrheic keratosis: Secondary | ICD-10-CM

## 2021-03-11 DIAGNOSIS — C44622 Squamous cell carcinoma of skin of right upper limb, including shoulder: Secondary | ICD-10-CM

## 2021-03-11 DIAGNOSIS — Z8709 Personal history of other diseases of the respiratory system: Secondary | ICD-10-CM

## 2021-03-11 NOTE — Patient Instructions (Signed)

## 2021-03-12 NOTE — Telephone Encounter (Signed)
Preop form successfully faxed to [facility] at [phone]

## 2021-03-13 DIAGNOSIS — M79642 Pain in left hand: Secondary | ICD-10-CM | POA: Diagnosis not present

## 2021-03-17 ENCOUNTER — Encounter: Payer: Self-pay | Admitting: Gastroenterology

## 2021-03-17 ENCOUNTER — Encounter: Payer: Self-pay | Admitting: Allergy and Immunology

## 2021-03-19 NOTE — Telephone Encounter (Signed)
The episodic nature does sound like infection would be the most likely, particular with the acute onset.  Glad to hear it sounds like the symptoms have now abated.  Can we please call to make sure she is tolerating p.o. without issue now.  Agree with plan for ER/urgent care evaluation if it happens again, particularly if after hours and if not tolerating p.o. intake.  Not unreasonable to start a probiotic to take for the next 4 weeks or so in case this was infectious and could help restore microbiota a little faster.

## 2021-03-20 ENCOUNTER — Other Ambulatory Visit (HOSPITAL_COMMUNITY): Payer: Self-pay

## 2021-03-20 DIAGNOSIS — Z79891 Long term (current) use of opiate analgesic: Secondary | ICD-10-CM | POA: Diagnosis not present

## 2021-03-20 DIAGNOSIS — M47816 Spondylosis without myelopathy or radiculopathy, lumbar region: Secondary | ICD-10-CM | POA: Diagnosis not present

## 2021-03-20 DIAGNOSIS — G894 Chronic pain syndrome: Secondary | ICD-10-CM | POA: Diagnosis not present

## 2021-03-20 DIAGNOSIS — M150 Primary generalized (osteo)arthritis: Secondary | ICD-10-CM | POA: Diagnosis not present

## 2021-03-20 MED ORDER — HYDROCODONE-ACETAMINOPHEN 7.5-325 MG PO TABS
ORAL_TABLET | ORAL | 0 refills | Status: DC
  Filled 2021-03-20: qty 100, 25d supply, fill #0

## 2021-03-21 DIAGNOSIS — G5602 Carpal tunnel syndrome, left upper limb: Secondary | ICD-10-CM | POA: Diagnosis not present

## 2021-03-21 DIAGNOSIS — J479 Bronchiectasis, uncomplicated: Secondary | ICD-10-CM | POA: Diagnosis not present

## 2021-03-25 ENCOUNTER — Encounter: Payer: Self-pay | Admitting: Gastroenterology

## 2021-03-25 ENCOUNTER — Other Ambulatory Visit: Payer: Self-pay

## 2021-03-25 ENCOUNTER — Ambulatory Visit: Payer: Medicare PPO | Admitting: Gastroenterology

## 2021-03-25 ENCOUNTER — Other Ambulatory Visit (HOSPITAL_COMMUNITY): Payer: Self-pay

## 2021-03-25 VITALS — BP 130/68 | HR 60 | Ht 64.0 in | Wt 157.5 lb

## 2021-03-25 DIAGNOSIS — K52832 Lymphocytic colitis: Secondary | ICD-10-CM

## 2021-03-25 DIAGNOSIS — M79642 Pain in left hand: Secondary | ICD-10-CM | POA: Diagnosis not present

## 2021-03-25 DIAGNOSIS — R112 Nausea with vomiting, unspecified: Secondary | ICD-10-CM

## 2021-03-25 DIAGNOSIS — K219 Gastro-esophageal reflux disease without esophagitis: Secondary | ICD-10-CM

## 2021-03-25 MED ORDER — ONDANSETRON 4 MG PO TBDP
4.0000 mg | ORAL_TABLET | Freq: Four times a day (QID) | ORAL | 1 refills | Status: DC | PRN
Start: 2021-03-25 — End: 2022-02-04
  Filled 2021-03-25: qty 30, 30d supply, fill #0

## 2021-03-25 NOTE — Patient Instructions (Signed)
If you are age 74 or older, your body mass index should be between 23-30. Your Body mass index is 27.03 kg/m. If this is out of the aforementioned range listed, please consider follow up with your Primary Care Provider.  If you are age 57 or younger, your body mass index should be between 19-25. Your Body mass index is 27.03 kg/m. If this is out of the aformentioned range listed, please consider follow up with your Primary Care Provider.   __________________________________________________________  The [facility] providers would like to encourage you to use [facility] to communicate with providers for non-urgent requests or questions.  Due to long hold times on the telephone, sending your provider a message by [facility] may be a faster and more efficient way to get a response.  Please allow 48 business hours for a response.  Please remember that this is for non-urgent requests.    We have sent the following medications to your pharmacy for you to pick up at your convenience:  Zofran 4mg    Follow up as needed.  Thank you for choosing me and [facility].  Vito Cirigliano, D.O.

## 2021-03-25 NOTE — Progress Notes (Signed)
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  Started on budesonide 9 mg/day x8 weeks with taper with symptomatic improvement (constipation during course of therapy) - H2 RA stopped - Does take PPI along with an SSRI and Voltaren gel  2) GERD with LPR symptoms: Well-controlled with Nexium 40 mg/day. Discontinued Pepcid 40 mg nightly in 09/2020 due to Lymphocytic colitis and good control.    Endoscopic History: - Colonoscopy (05/2020): Benign rectal polyp, otherwise normal.  Biopsies with Lymphocytic Colitis.  Internal hemorrhoids.  Normal TI  HPI:     Patient is a 76 y.o. female presenting to the Gastroenterology Clinic for evaluation of episodic nausea/vomiting.  Last seen by me on 10/15/2020 for follow-up of lymphocytic colitis, which was well controlled at that time.  Reflux was also well controlled (now Nexium QAM and Pepcid QPM). No dysphagia.   Did have slight recurrence of diarrhea in November.  Was again treated with budesonide 6 mg/day x4 weeks, then decrease to 3 mg/day x6 months.  Excellent response to budesonide.  Today, her main issue is 2 episodes of nausea/vomiting that occurred in January. First episode lasted 36 hours, returned to her USOH, then recurrence a couple weeks later, lasting <24 hours. No hematemesis, melena, hematochezia. No vomiting for a few weeks now. Back to usual PO intake. Weight stable.   Taking probiotic. Bowel habits normal.   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   right sholder   BCC (basal cell carcinoma of skin) 05/09/2014   mid lower back   BCC (basal cell carcinoma of skin) 05/03/2017   right low back   BCC (basal cell carcinoma of skin) 05/03/2017   left upper back   BCC (basal cell carcinoma of skin) 07/05/2018   left mid back   BCC (basal cell carcinoma of skin) 05/20/1992   upper back   BCC (basal cell carcinoma of skin) 07/29/1993   left sholder medial   BCC (basal cell carcinoma of skin) 07/29/1993   left sholder lateral   BCC (basal cell carcinoma of skin) 07/29/1993   right thigh   BCC (basal cell carcinoma of skin) 07/29/1993   right sholder   BCC (basal cell carcinoma of skin) 12/22/1994   right mid forearm   BCC (basal cell carcinoma of skin) 12/22/1994   right upper forearm   BCC (basal cell carcinoma of skin) 12/22/1994   lower right upper arm   BCC (basal cell carcinoma of skin) 12/22/1994   right upper arm sholder   BCC (basal cell carcinoma of skin) 08/11/1995   left leg below knee   BCC (basal cell carcinoma of skin) 04/11/2002   mid back   BCC (basal cell carcinoma of skin) 12/10/2002   right center upper back   BCC (basal cell carcinoma of  skin) 05/26/2005   right post sholder   BCC (basal cell carcinoma) 05/09/2014   left inner shin   BCC (basal cell carcinoma) 06/12/2014   left forearm   Bowen's disease 10/07/1994   right post knee, right inner forearm/wrist   Bowen's disease 08/11/1995   left sholder   Bronchiectasis ([place])    Cataract    left   Chronic pain    Common variable immunodeficiency [facility])    sees dr Gayleen Orem 02-11-2021 epic   Depression    hx of   Diverticulosis of colon 1998   mild   DJD (degenerative joint disease)    Duodenal ulcer    h/o yrs ago   GERD (gastroesophageal reflux disease)    History of SCC  (squamous cell carcinoma) of skin    Dr. Denna Haggard   History of sinus bradycardia    HLD (hyperlipidemia)    hypertriglyceridemia   Hypertensive retinopathy of both eyes 11/18/2017   Hypothyroidism    IBS (irritable bowel syndrome)    Internal hemorrhoids 1998   Lymphocytic colitis    Mitral regurgitation    mild   Nocardiosis    relased by infection disease dec 2022   Osteoarthritis    feet,shoulder,neck,back,hips and hands.   Pneumonia 2015   PONV (postoperative nausea and vomiting)    Rotator cuff tear, right 02/2019   infraspinatus and supraspinatus, and dislocation of long head of bicep tendon (Dr. Alvan Dame)   SCC (squamous cell carcinoma) 11/26/2014   left hand, right hand, right deltoid mnay areas   SCC (squamous cell carcinoma) 05/03/2017   left cheek   Sleep apnea    uses a mouth guard nightly   Squamous cell carcinoma in situ (SCCIS) 07/05/2018   left hand   Tracheobronchomalacia    Vitamin D deficiency    mild    Patient's surgical history, family medical history, social history, medications and allergies were all reviewed in Epic    Current Outpatient Medications  Medication Sig Dispense Refill   acetaminophen (TYLENOL) 500 MG tablet Take 1,000 mg by mouth every 6 (six) hours as needed for moderate pain.      albuterol (PROVENTIL HFA;VENTOLIN HFA) 108 (90 Base) MCG/ACT inhaler Inhale 2 puffs into the lungs every 6 (six) hours as needed for wheezing or shortness of breath. 18 g 1   aspirin EC 81 MG tablet Take 81 mg by mouth daily.     atorvastatin (LIPITOR) 40 MG tablet Take 1 tablet by mouth daily. 90 tablet 1   budesonide (ENTOCORT EC) 3 MG 24 hr capsule Take by mouth daily.     Buprenorphine HCl (BELBUCA) 450 MCG FILM Place 1 film to inside of mouth every twelve hours 60 Film 2   Calcium Carbonate-Vitamin D 600-200 MG-UNIT TABS Take 1 tablet by mouth 2 (two) times daily.     celecoxib (CELEBREX) 200 MG capsule TAKE 1 CAPSULE BY MOUTH 2 TIMES DAILY 180 capsule 1    diclofenac Sodium (VOLTAREN) 1 % GEL Apply 2 g topically 4 (four) times daily as needed (joint pain).     Docusate Sodium 100 MG capsule 1 capsule daily     EPINEPHrine 0.3 mg/0.3 mL IJ SOAJ injection SMARTSIG:0.3 Milliliter(s) IM PRN     esomeprazole (NEXIUM) 40 MG capsule TAKE 1 CAPSULE BY MOUTH DAILY IN THE MORNING 90 capsule 2   famotidine (PEPCID) 40 MG tablet TAKE 1 TABLET BY MOUTH AT BEDTIME. 90 tablet 2   gabapentin (NEURONTIN) 800 MG tablet Take 1  tablet by mouth at bedtime. 90 tablet 2   GUAIFENESIN ER PO Take 400 mg by mouth 2 (two) times daily.     HYDROcodone-acetaminophen (NORCO) 7.5-325 MG tablet Take 1 tablet by mouth four times a day as needed for pain 100 tablet 0   Immune Globulin, Human,-klhw (XEMBIFY) 10 GM/50ML SOLN Wednesday, pt does own infusion at home     ipratropium (ATROVENT) 0.03 % nasal spray Place 1 spray into both nostrils 3 times daily. 30 mL 12   levothyroxine (SYNTHROID) 25 MCG tablet Take 1 tablet (25 mcg total) by mouth daily. 90 tablet 1   Melatonin-Pyridoxine (MELATIN PO) 10 mg at bedtime.     methocarbamol (ROBAXIN) 500 MG tablet Take 1 tablet (500 mg total) by mouth 3 (three) times daily. 360 tablet 0   Misc Natural Products (FIBER 7 PO) 2 gummies daily     montelukast (SINGULAIR) 10 MG tablet TAKE 1 TABLET BY MOUTH AT BEDTIME. 90 tablet 2   Multiple Vitamins-Minerals (MULTIVITAMIN WITH MINERALS) tablet Take 1 tablet by mouth daily.     omega-3 acid ethyl esters (LOVAZA) 1 g capsule TAKE 2 CAPSULES BY MOUTH 2 TIMES DAILY 360 capsule 0   PARoxetine (PAXIL) 30 MG tablet Take 1 tablet by mouth once a day. 90 tablet 1   Vitamin D, Ergocalciferol, (DRISDOL) 1.25 MG (50000 UNIT) CAPS capsule Take 1 capsule by mouth every 7 days for 12 doses. 12 capsule 0   No current facility-administered medications for this visit.    Physical Exam:     BP 130/68    Pulse 60    Ht 5\' 4"  (1.626 m)    Wt 157 lb 8 oz (71.4 kg)    SpO2 99%    BMI 27.03 kg/m   GENERAL:   Pleasant female in NAD PSYCH: : Cooperative, normal affect CARDIAC:  RRR, no murmur heard, no peripheral edema PULM: Normal respiratory effort, lungs CTA bilaterally, no wheezing ABDOMEN:  Nondistended, soft, nontender. No obvious masses, no hepatomegaly,  normal bowel sounds SKIN:  turgor, no lesions seen Musculoskeletal:  Normal muscle tone, normal strength NEURO: Alert and oriented x 3, no focal neurologic deficits   IMPRESSION and PLAN:    1) Nausea/Vomiting - 2 isolated episodes of nausea with nonbloody emesis last month.  Has now gone several weeks without recurrence.  High suspicion for infectious etiology given acute onset and self-limiting nature.  No lingering symptoms. - Continue probiotics for 4 weeks, then can discontinue - Continue advancing diet as tolerated - Provided with Rx for Zofran 4 mg ODT prn recurrence of nausea/vomiting  2) Lymphocytic colitis - Well-controlled  3) GERD - Well-controlled on current therapy   RTC prn           Dominic Pea Christain Niznik ,DO, FACG 03/25/2021, 10:13 AM

## 2021-03-27 ENCOUNTER — Other Ambulatory Visit (HOSPITAL_COMMUNITY): Payer: Self-pay

## 2021-03-30 ENCOUNTER — Encounter: Payer: Self-pay | Admitting: Dermatology

## 2021-03-30 NOTE — Progress Notes (Signed)
Follow-Up Visit   Subjective  Beverly [name] is a 76 y.o. female who presents for the following: Annual Exam (Pt here for annual. Pt has some spots that cause some discomfort).  General skin examination, several areas of concern.  Good medical news is that her pulmonary infection is stated to be clear. Location:  Duration:  Quality:  Associated Signs/Symptoms: Modifying Factors:  Severity:  Timing: Context:   Objective  Well appearing patient in no apparent distress; mood and affect are within normal limits. Scalp General skin examination, 3 possible new nonmelanoma skin cancers which will be biopsied.  No atypical pigmented lesions.  Right Clavicle Textured tan flattopped 5 mm papule with typical dermoscopy  Left Upper Back 8 Millimeter waxy pink papule with central erosion       Right Dorsal Hand Ill-defined 1 cm thick pink crust       Left Flank Inflamed 9 mm crust         A full examination was performed including scalp, head, eyes, ears, nose, lips, neck, chest, axillae, abdomen, back, buttocks, bilateral upper extremities, bilateral lower extremities, hands, feet, fingers, toes, fingernails, and toenails. All findings within normal limits unless otherwise noted below.  Areas beneath undergarments not fully examined   Assessment & Plan    Screening exam for skin cancer Scalp  Annual skin examination, patient encouraged to self examine twice annually  Seborrheic keratosis Right Clavicle  Leave if stable  Basal cell carcinoma (BCC) of back Left Upper Back  Skin / nail biopsy Type of biopsy: tangential   Informed consent: discussed and consent obtained   Timeout: patient name, date of birth, surgical site, and procedure verified   Anesthesia: the lesion was anesthetized in a standard fashion   Anesthetic:  1% lidocaine w/ epinephrine 1-100,000 local infiltration Instrument used: flexible razor blade   Hemostasis achieved with:  aluminum chloride and electrodesiccation   Outcome: patient tolerated procedure well   Post-procedure details: wound care instructions given    Destruction of lesion Complexity: simple   Destruction method: electrodesiccation and curettage   Informed consent: discussed and consent obtained   Timeout:  patient name, date of birth, surgical site, and procedure verified Anesthesia: the lesion was anesthetized in a standard fashion   Anesthetic:  1% lidocaine w/ epinephrine 1-100,000 local infiltration Curettage performed in three different directions: Yes   Curettage cycles:  3 Lesion length (cm):  0.8 Lesion width (cm):  0.8 Margin per side (cm):  0 Final wound size (cm):  0.8 Hemostasis achieved with:  aluminum chloride Outcome: patient tolerated procedure well with no complications   Post-procedure details: wound care instructions given    Specimen 1 - Surgical pathology Differential Diagnosis: R/O BCC VS SCC - TXPBX  Check Margins: No  Shave biopsy the base was treated with curettage plus cautery  Squamous cell carcinoma of right hand Right Dorsal Hand  Skin / nail biopsy Type of biopsy: tangential   Informed consent: discussed and consent obtained   Timeout: patient name, date of birth, surgical site, and procedure verified   Anesthesia: the lesion was anesthetized in a standard fashion   Anesthetic:  1% lidocaine w/ epinephrine 1-100,000 local infiltration Instrument used: flexible razor blade   Hemostasis achieved with: aluminum chloride and electrodesiccation   Outcome: patient tolerated procedure well   Post-procedure details: wound care instructions given    Destruction of lesion Complexity: simple   Destruction method: electrodesiccation and curettage   Informed consent: discussed and consent obtained  Timeout:  patient name, date of birth, surgical site, and procedure verified Anesthesia: the lesion was anesthetized in a standard fashion   Anesthetic:  1%  lidocaine w/ epinephrine 1-100,000 local infiltration Curettage performed in three different directions: Yes   Electrodesiccation performed over the curetted area: Yes   Curettage cycles:  3 Lesion length (cm):  1.2 Lesion width (cm):  1.2 Margin per side (cm):  0 Final wound size (cm):  1.2 Hemostasis achieved with:  aluminum chloride Outcome: patient tolerated procedure well with no complications   Post-procedure details: wound care instructions given    Specimen 3 - Surgical pathology Differential Diagnosis: R/O BCC VS SCC - TXPBX  Check Margins: No  After shave biopsy the base was treated with curettage plus cautery  Neoplasm of uncertain behavior Left Flank  Skin / nail biopsy Type of biopsy: tangential   Informed consent: discussed and consent obtained   Timeout: patient name, date of birth, surgical site, and procedure verified   Anesthesia: the lesion was anesthetized in a standard fashion   Anesthetic:  1% lidocaine w/ epinephrine 1-100,000 local infiltration Instrument used: flexible razor blade   Hemostasis achieved with: aluminum chloride and electrodesiccation   Outcome: patient tolerated procedure well   Post-procedure details: wound care instructions given    Specimen 2 - Surgical pathology Differential Diagnosis: R/O ISK   Check Margins: No  After shave biopsy the base was treated with curettage plus cautery      I, Lavonna Monarch, MD, have reviewed all documentation for this visit.  The documentation on 03/30/21 for the exam, diagnosis, procedures, and orders are all accurate and complete.

## 2021-04-01 DIAGNOSIS — Z96611 Presence of right artificial shoulder joint: Secondary | ICD-10-CM | POA: Diagnosis not present

## 2021-04-06 ENCOUNTER — Encounter: Payer: Self-pay | Admitting: Dermatology

## 2021-04-06 DIAGNOSIS — L570 Actinic keratosis: Secondary | ICD-10-CM

## 2021-04-07 MED ORDER — TOLAK 4 % EX CREA: TOPICAL_CREAM | CUTANEOUS | 0 refills | Status: DC

## 2021-04-08 ENCOUNTER — Ambulatory Visit: Payer: Medicare PPO | Admitting: Allergy and Immunology

## 2021-04-09 ENCOUNTER — Other Ambulatory Visit (HOSPITAL_COMMUNITY): Payer: Self-pay

## 2021-04-09 ENCOUNTER — Other Ambulatory Visit: Payer: Self-pay | Admitting: Gastroenterology

## 2021-04-10 ENCOUNTER — Encounter: Payer: Self-pay | Admitting: Internal Medicine

## 2021-04-10 ENCOUNTER — Other Ambulatory Visit (HOSPITAL_COMMUNITY): Payer: Self-pay

## 2021-04-10 ENCOUNTER — Other Ambulatory Visit: Payer: Self-pay | Admitting: Internal Medicine

## 2021-04-11 ENCOUNTER — Encounter: Payer: Self-pay | Admitting: Gastroenterology

## 2021-04-11 ENCOUNTER — Other Ambulatory Visit (HOSPITAL_COMMUNITY): Payer: Self-pay

## 2021-04-11 NOTE — Telephone Encounter (Signed)
Yes, the plan for the budesonide would be to treat with 3 mg/day x6 months as an extended course.  We do not want to continue a steroid indefinitely if we do not have to.  Typically Microscopic Colitis (Lymphocytic Colitis) will resolve with steroids, with some patients requiring an extended course such as her current plan.  But again, we want to try to avoid chronic steroids as much as possible.

## 2021-04-14 ENCOUNTER — Other Ambulatory Visit: Payer: Self-pay | Admitting: Internal Medicine

## 2021-04-14 DIAGNOSIS — E559 Vitamin D deficiency, unspecified: Secondary | ICD-10-CM

## 2021-04-14 NOTE — Telephone Encounter (Signed)
This is a historical med.  Okay to refill?

## 2021-04-16 ENCOUNTER — Encounter: Payer: Self-pay | Admitting: Allergy and Immunology

## 2021-04-16 ENCOUNTER — Other Ambulatory Visit (HOSPITAL_COMMUNITY): Payer: Self-pay

## 2021-04-16 ENCOUNTER — Other Ambulatory Visit: Payer: Self-pay | Admitting: Internal Medicine

## 2021-04-16 ENCOUNTER — Other Ambulatory Visit: Payer: Self-pay | Admitting: *Deleted

## 2021-04-16 ENCOUNTER — Encounter: Payer: Self-pay | Admitting: Internal Medicine

## 2021-04-16 DIAGNOSIS — M159 Polyosteoarthritis, unspecified: Secondary | ICD-10-CM

## 2021-04-16 DIAGNOSIS — R7989 Other specified abnormal findings of blood chemistry: Secondary | ICD-10-CM

## 2021-04-16 MED ORDER — ALBUTEROL SULFATE HFA 108 (90 BASE) MCG/ACT IN AERS
2.0000 | INHALATION_SPRAY | Freq: Four times a day (QID) | RESPIRATORY_TRACT | 1 refills | Status: DC | PRN
  Filled 2021-04-16: qty 18, 25d supply, fill #0

## 2021-04-16 MED ORDER — CELECOXIB 200 MG PO CAPS
ORAL_CAPSULE | Freq: Two times a day (BID) | ORAL | 1 refills | Status: DC
  Filled 2021-04-16: qty 180, 90d supply, fill #0
  Filled 2021-07-23: qty 180, 90d supply, fill #1

## 2021-04-16 MED ORDER — OMEGA-3-ACID ETHYL ESTERS 1 G PO CAPS
ORAL_CAPSULE | ORAL | 0 refills | Status: DC
  Filled 2021-04-16: qty 360, 90d supply, fill #0

## 2021-04-17 ENCOUNTER — Other Ambulatory Visit (HOSPITAL_COMMUNITY): Payer: Self-pay

## 2021-04-18 DIAGNOSIS — J479 Bronchiectasis, uncomplicated: Secondary | ICD-10-CM | POA: Diagnosis not present

## 2021-04-19 ENCOUNTER — Other Ambulatory Visit (HOSPITAL_COMMUNITY): Payer: Self-pay

## 2021-04-21 ENCOUNTER — Other Ambulatory Visit (HOSPITAL_COMMUNITY): Payer: Self-pay

## 2021-04-23 ENCOUNTER — Ambulatory Visit: Payer: Self-pay | Admitting: Orthopedic Surgery

## 2021-04-23 ENCOUNTER — Telehealth: Payer: Self-pay

## 2021-04-23 DIAGNOSIS — Z01818 Encounter for other preprocedural examination: Secondary | ICD-10-CM

## 2021-04-23 NOTE — Telephone Encounter (Signed)
? ?  Name: Beverly [name]  ?DOB: 11/08/1945  ?MRN: [id number] ? ?Primary Cardiologist: [name], MD (Last seen 06/2019) ? ?Chart reviewed as part of pre-operative protocol coverage. Because of Rmani Kapusta Winebarger's past medical history and time since last visit, she will require a follow-up visit in clinic order to better assess preoperative cardiovascular risk. ? ?Pre-op covering staff: ?- Please schedule appointment and call patient to inform them. If patient already had an upcoming appointment within acceptable timeframe, please add "pre-op clearance" to the appointment notes so provider is aware. ?- Please contact requesting surgeon's office via preferred method (i.e, phone, fax) to inform them of need for appointment prior to surgery. ? ?If applicable, this message will also be routed to pharmacy pool and/or primary cardiologist for input on holding anticoagulant/antiplatelet agent as requested below so that this information is available to the clearing provider at time of patient's appointment.  ? ?Leanor Kail, PA  ?04/23/2021, 10:15 AM   ?

## 2021-04-23 NOTE — Telephone Encounter (Signed)
Pt has been scheduled to see Ermalinda Barrios, PA-C, 05/07/21, clearance will be addressed at that visit. ? ?Will route back to the requesting surgeon's office to make them aware.  ?

## 2021-04-23 NOTE — Telephone Encounter (Signed)
? ?  Pre-operative Risk Assessment  ?  ?Patient Name: Beverly [name]  ?DOB: August 01, 1945 ?MRN: [id number] ? ? ?  ? ?Request for Surgical Clearance ? ? ?Procedure:   Olique Lumbar Interbody Fusion L3-5; Posterior Lumbar Fusion Interbody L3-5 ? ?Date of Surgery:  Clearance 05/28/21-05/29/2021                              ? ?  ?Surgeon:  Dr Melina Schools ?Surgeon's Group or Practice Name:  [facility]  ?Phone number:  [phone]  ?Fax number:  [phone] ? ?Type of Clearance Requested:   ?- Medical  ?- Pharmacy:  Hold Aspirin   ? ?Type of Anesthesia:  General  ? ?Additional requests/questions:   N/A ? ?Signed, ?Ulice Brilliant T   ?04/23/2021, 9:57 AM  ? ?

## 2021-04-24 NOTE — Progress Notes (Unsigned)
Cardiology Office Note    Date:  04/24/2021   ID:  Beverly [name] [birth year], MRN [id number]   PCP:  Beverly [name], [place]  Cardiologist:  Beverly [place], [name] *** Advanced Practice Provider:  No care team member to display Electrophysiologist:  None   [phone]   No chief complaint on file.   History of Present Illness:  Beverly [name] is a 76 y.o. female with history of coronary calcification on CT 06/2019,SVT/nocturnal bradycardia, chest pain with normal NSSTS 06/2019, HLD, OSA, ILD with severe tracheobronchomalacia followed by pulm.  Patient last saw Beverly [name] 06/2019 and doing well.  Patient on my schedule today for preop clearance for lumbar surgery 05/28/21 Beverly [name].    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   right sholder   BCC (basal cell carcinoma of skin) 05/09/2014   mid lower back   BCC (basal cell carcinoma of skin) 05/03/2017   right low back   BCC (basal cell carcinoma of skin) 05/03/2017   left upper back   BCC (basal cell carcinoma of skin) 07/05/2018   left mid back   BCC (basal cell carcinoma of skin) 05/20/1992   upper back   BCC (basal cell carcinoma of skin) 07/29/1993   left sholder medial   BCC (basal cell carcinoma of skin) 07/29/1993   left sholder lateral   BCC (basal cell carcinoma of skin) 07/29/1993   right thigh   BCC (basal cell carcinoma of skin) 07/29/1993   right sholder   BCC (basal cell carcinoma of skin) 12/22/1994   right mid forearm   BCC (basal cell carcinoma of skin) 12/22/1994   right upper forearm   BCC (basal cell carcinoma of skin) 12/22/1994   lower right upper arm   BCC (basal cell carcinoma of skin)  12/22/1994   right upper arm sholder   BCC (basal cell carcinoma of skin) 08/11/1995   left leg below knee   BCC (basal cell carcinoma of skin) 04/11/2002   mid back   BCC (basal cell carcinoma of skin) 12/10/2002   right center upper back   BCC (basal cell carcinoma of skin) 05/26/2005   right post sholder   BCC (basal cell carcinoma) 05/09/2014   left inner shin   BCC (basal cell carcinoma) 06/12/2014   left forearm   Bowen's disease 10/07/1994   right post knee, right inner forearm/wrist   Bowen's disease 08/11/1995   left sholder   Bronchiectasis ([place])    Cataract    left   Chronic pain    Common variable immunodeficiency ([place])    sees dr Beverly [name] 02-11-2021 epic   Depression    hx of   Diverticulosis of colon 1998   mild   DJD (degenerative joint disease)    Duodenal ulcer    h/o yrs ago   GERD (gastroesophageal reflux disease)    History of SCC (squamous cell carcinoma) of skin    Beverly [name]   History of sinus bradycardia    HLD (hyperlipidemia)    hypertriglyceridemia   Hypertensive retinopathy of both eyes 11/18/2017   Hypothyroidism  IBS (irritable bowel syndrome)    Internal hemorrhoids 1998   Lymphocytic colitis    Mitral regurgitation    mild   Nocardiosis    relased by infection disease dec 2022   Osteoarthritis    feet,shoulder,neck,back,hips and hands.   Pneumonia 2015   PONV (postoperative nausea and vomiting)    Rotator cuff tear, right 02/2019   infraspinatus and supraspinatus, and dislocation of long head of bicep tendon (Beverly [name])   SCC (squamous cell carcinoma) 11/26/2014   left hand, right hand, right deltoid mnay areas   SCC (squamous cell carcinoma) 05/03/2017   left cheek   Sleep apnea    uses a mouth guard nightly   Squamous cell carcinoma in situ (SCCIS) 07/05/2018   left hand   Tracheobronchomalacia    Vitamin D deficiency    mild    Past Surgical History:  Procedure Laterality Date   ABDOMINAL HYSTERECTOMY  1998    complete   BLEPHAROPLASTY Bilateral 01/2018   BROW LIFT Bilateral 10/10/2018   Procedure: BROW LIFT;  Surgeon: Beverly [name];  Location: [facility];  Service: Plastics;  Laterality: Bilateral;   BUNIONECTOMY     R 12/08, L 2004 (Beverly [name])   [place]   bilateral   CARPAL TUNNEL RELEASE Left 03/06/2021   Procedure: Left Revision Carpal Tunnel Release with hypothenar fat pad flap;  Surgeon: Beverly [name];  Location: [facility];  Service: Orthopedics;  Laterality: Left;  with local anesthesia   CATARACT EXTRACTION Bilateral Left in 08/2012, Right 04/2016   [facility]   CESAREAN SECTION     1981 and [place]   laparoscopic   CLOSED REDUCTION NASAL FRACTURE N/A 08/29/2018   Procedure: CLOSED REDUCTION NASAL FRACTURE;  Surgeon: Beverly [name];  Location: [facility];  Service: Plastics;  Laterality: N/A;  1 hour, please   COLONOSCOPY  2006   COLONOSCOPY  01/2012   due again 01/2021; mild diverticulosis   epidural steroid injection, back  02/2010   HIP SURGERY     right bursectomy x 3   HIP SURGERY Right 2000   torn cartilage, repaired   INGUINAL HERNIA REPAIR  10/2007   bilat   NECK SURGERY  1989   c6-7 cervical laminectomy and diskecotmy   REVERSE SHOULDER ARTHROPLASTY Right 04/11/2019   Procedure: REVERSE SHOULDER ARTHROPLASTY;  Surgeon: Nicholes Stairs, [name];  Location: [facility];  Service: Orthopedics;  Laterality: Right;  [place] Block   SHOULDER SURGERY Left 03/2003   left rotator cuff repair   SHOULDER SURGERY Left 03/14/2018   rotator cuff repair; Beverly [name]   TONSILLECTOMY  age 85   TROCHANTERIC [place] Right [place] EXTRACTION     as teenager    Current Medications: No outpatient medications have been marked as taking for the 05/07/21 encounter (Appointment) with Beverly [name].     Allergies:   Cat hair  extract, Other, Short ragweed pollen ext, Adhesive [tape], and Codeine   Social History   Socioeconomic History   Marital status: Divorced    Spouse name: Not on file   Number of children: 2   Years of education: Not on file   Highest education level: Not on file  Occupational History   Occupation: STAFF NURSE    Employer: [employer]  Tobacco Use   Smoking status: Never  Smokeless tobacco: Never  Vaping Use   Vaping Use: Never used  Substance and Sexual Activity   Alcohol use: Yes    Alcohol/week: 7.0 standard drinks    Types: 7 Glasses of wine per week    Comment: 1 glass wine per day   Drug use: No   Sexual activity: Not Currently  Other Topics Concern   Not on file  Social History Narrative   Divorced, lives alone, [place]; 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   [place] for [employer] part-time   Social Determinants of Health   Financial Resource Strain: Not on file  Food Insecurity: Not on file  Transportation Needs: Not on file  Physical Activity: Not on file  Stress: Not on file  Social Connections: Not on file     Family History:  The patient's ***family history includes ADD / ADHD in an other family member; Asthma in her son; COPD in her sister; Cancer in her brother, father, and sister; Cancer (age of onset: 46) in her paternal grandmother; Colon cancer in her paternal grandmother; Depression in her mother; Diabetes in her maternal grandmother; Esophageal cancer in her father; Heart disease in her father; Hyperlipidemia in her daughter; Hypertension in her sister; Hyperthyroidism in her brother; Liver cancer in her paternal grandmother; Osteoporosis in her sister; Schizophrenia in her mother.   ROS:   Please see the history of present illness.    ROS All other systems reviewed and are negative.   PHYSICAL EXAM:   VS:   There were no vitals taken for this visit.  Physical Exam  GEN: Well nourished, well developed, in no acute distress  HEENT: normal  Neck: no JVD, carotid bruits, or masses Cardiac:RRR; no murmurs, rubs, or gallops  Respiratory:  clear to auscultation bilaterally, normal work of breathing GI: soft, nontender, nondistended, + BS Ext: without cyanosis, clubbing, or edema, Good distal pulses bilaterally MS: no deformity or atrophy  Skin: warm and dry, no rash Neuro:  Alert and Oriented x 3, Strength and sensation are intact Psych: euthymic mood, full affect  Wt Readings from Last 3 Encounters:  03/25/21 157 lb 8 oz (71.4 kg)  03/06/21 153 lb 3.2 oz (69.5 kg)  02/11/21 155 lb 9.6 oz (70.6 kg)      Studies/Labs Reviewed:   EKG:  EKG is*** ordered today.  The ekg ordered today demonstrates ***  Recent Labs: 11/20/2020: ALT 29; Hemoglobin 10.6; Magnesium 2.0; Platelets 117.0 12/24/2020: TSH 1.32 02/04/2021: BUN 17; Creat 0.73; Potassium 4.9; Sodium 140   Lipid Panel    Component Value Date/Time   CHOL 137 11/20/2020 1630   CHOL 162 01/11/2019 0815   TRIG 129.0 11/20/2020 1630   HDL 40.90 11/20/2020 1630   HDL 47 01/11/2019 0815   CHOLHDL 3 11/20/2020 1630   VLDL 25.8 11/20/2020 1630   LDLCALC 70 11/20/2020 1630   LDLCALC 85 01/11/2019 0815    Additional studies/ records that were reviewed today include:     CT Chest High Resolution   Result Date: 06/20/2019 CLINICAL DATA:  76 year old female with history of interstitial lung disease. Evaluate for bronchiectasis. EXAM: CT CHEST WITHOUT CONTRAST TECHNIQUE: Multidetector CT imaging of the chest was performed following the standard protocol without intravenous contrast. High resolution imaging of the lungs, as well as inspiratory and expiratory imaging, was performed. COMPARISON:  No priors. FINDINGS: Cardiovascular: Heart size is normal. There is  no significant pericardial fluid, thickening or pericardial calcification. There is  aortic atherosclerosis, as well as atherosclerosis of the great vessels of the mediastinum and the coronary arteries, including calcified atherosclerotic plaque in the left main and left anterior descending coronary arteries. Mediastinum/Nodes: No pathologically enlarged mediastinal or hilar lymph nodes. Please note that accurate exclusion of hilar adenopathy is limited on noncontrast CT scans. Esophagus is unremarkable in appearance. No axillary lymphadenopathy. Lungs/Pleura: High-resolution images demonstrates some patchy areas of ground-glass attenuation, peripheral predominant septal thickening and mild cylindrical bronchiectasis noted throughout the lungs bilaterally, most evident in the mid to lower lungs. No honeycombing. Inspiratory and expiratory imaging demonstrates moderate air trapping indicative of small airways disease. There is also extensive collapse of the trachea and mainstem bronchi. Upper Abdomen: Aortic atherosclerosis. Musculoskeletal: There are no aggressive appearing lytic or blastic lesions noted in the visualized portions of the skeleton. IMPRESSION: 1. There is a spectrum of findings compatible with interstitial lung disease, categorized as probable usual interstitial pneumonia (UIP) per current ATS guidelines. However, there is lack of overt honeycombing, and moderate air trapping which is unusual for usual interstitial pneumonia. The possibility of nonspecific interstitial pneumonia (NSIP) should be considered. 2. Severe tracheobronchomalacia. 3. Aortic atherosclerosis, in addition to left main and left anterior descending coronary artery disease. Assessment for potential risk factor modification, dietary therapy or pharmacologic therapy may be warranted, if clinically indicated. Aortic Atherosclerosis (ICD10-I70.0). Electronically Signed   By: Vinnie Langton M.D.   On: 06/20/2019 16:23    NM Myocar Multi W/Spect Tamela Oddi Motion / EF   Result Date: 06/24/2019 CLINICAL DATA:  Chest  pain. EXAM: MYOCARDIAL IMAGING WITH SPECT (REST AND PHARMACOLOGIC-STRESS) GATED LEFT VENTRICULAR WALL MOTION STUDY LEFT VENTRICULAR EJECTION FRACTION TECHNIQUE: Standard myocardial SPECT imaging was performed after resting intravenous injection of 10.2 mCi Tc-56mtetrofosmin. Subsequently, intravenous infusion of Lexiscan was performed under the supervision of the Cardiology staff. At peak effect of the drug, 31.0 mCi Tc-917metrofosmin was injected intravenously and standard myocardial SPECT imaging was performed. Quantitative gated imaging was also performed to evaluate left ventricular wall motion, and estimate left ventricular ejection fraction. COMPARISON:  None. FINDINGS: Perfusion: Artifact is seen on resting images due to subdiaphragmatic splanchnic activity along the inferior wall of the left ventricle. No decreased activity in the left ventricle on stress imaging to suggest reversible ischemia or infarction. Wall Motion: The inferior left ventricular wall appears hypokinetic, however this is likely artifactual given the subdiaphragmatic activity along the inferior wall. No left ventricular dilation. Left Ventricular Ejection Fraction: 74 % End diastolic volume 63 ml End systolic volume 16 ml IMPRESSION: 1. Artifact due to subdiaphragmatic splanchnic activity along the inferior left ventricular wall. No definite reversible ischemia or infarction. 2. Inferior wall hypokinesis is likely artifactual, related to subdiaphragmatic splanchnic activity. 3. Left ventricular ejection fraction 74% 4. Non invasive risk stratification*: Low *2012 Appropriate Use Criteria for Coronary Revascularization Focused Update: J Am Coll Cardiol. 203329;51(8):[email or web address]://[email or web address]?[email or web address] Electronically Signed   By: JoMarlaine Hind.D.   On: 06/24/2019 12:26    ECHOCARDIOGRAM COMPLETE   Result Date: 06/23/2019    ECHOCARDIOGRAM REPORT   Patient Name:   DEKYLEI PURINGTONate of Exam: 06/23/2019  Medical Rec #:  [id number]       Height:       65.0 in Accession #:    [id number]      Weight:       162.7 lb Date of Birth:  1212/14/1947  BSA:          1.812 m Patient Age:    89 years          BP:           137/62 mmHg Patient Gender: F                 HR:           54 bpm. Exam Location:  Inpatient Procedure: 2D Echo Indications:    chest pain  History:        Patient has prior history of Echocardiogram examinations, most                 recent 01/17/2015. Risk Factors:Dyslipidemia.  Sonographer:    Jannett Celestine RDCS (AE) Referring Phys: [phone] [facility] A SM[facility]  Sonographer Comments: challenging apical windows IMPRESSIONS  1. Left ventricular ejection fraction, by estimation, is 55 to 60%. The left ventricle has normal function. The left ventricle has no regional wall motion abnormalities. There is mild left ventricular hypertrophy. Left ventricular diastolic function could not be evaluated.  2. Right ventricular systolic function is normal. The right ventricular size is normal.  3. The mitral valve is grossly normal. Trivial mitral valve regurgitation. No evidence of mitral stenosis.  4. The aortic valve has an indeterminant number of cusps. Aortic valve regurgitation is not visualized. Mild aortic valve sclerosis is present, with no evidence of aortic valve stenosis. FINDINGS  Left Ventricle: Left ventricular ejection fraction, by estimation, is 55 to 60%. The left ventricle has normal function. The left ventricle has no regional wall motion abnormalities. The left ventricular internal cavity size was normal in size. There is  mild left ventricular hypertrophy. Left ventricular diastolic function could not be evaluated. Right Ventricle: The right ventricular size is normal. No increase in right ventricular wall thickness. Right ventricular systolic function is normal. Left Atrium: Left atrial size was normal in size. Right Atrium: Right atrial size was normal in size. Pericardium: Trivial pericardial  effusion is present. Mitral Valve: The mitral valve is grossly normal. Trivial mitral valve regurgitation. No evidence of mitral valve stenosis. Tricuspid Valve: The tricuspid valve is grossly normal. Tricuspid valve regurgitation is trivial. No evidence of tricuspid stenosis. Aortic Valve: The aortic valve has an indeterminant number of cusps. Aortic valve regurgitation is not visualized. Mild aortic valve sclerosis is present, with no evidence of aortic valve stenosis. There is mild calcification of the aortic valve. Pulmonic Valve: The pulmonic valve was not well visualized. Pulmonic valve regurgitation is not visualized. No evidence of pulmonic stenosis. Aorta: The aortic arch was not well visualized and the ascending aorta was not well visualized. IAS/Shunts: The atrial septum is grossly normal.  LEFT VENTRICLE PLAX 2D LVIDd:         2.80 cm  Diastology LVIDs:         2.00 cm  LV e' lateral:   6.85 cm/s LV PW:         1.20 cm  LV E/e' lateral: 10.5 LV IVS:        1.50 cm LVOT diam:     1.80 cm LV SV:         60 LV SV Index:   33 LVOT Area:     2.54 cm  LEFT ATRIUM             Index LA diam:        2.50 cm 1.38 cm/m LA Vol (A2C):   50.0 ml 27.59 ml/m  LA Vol (A4C):   26.5 ml 14.62 ml/m LA Biplane Vol: 36.2 ml 19.98 ml/m  AORTIC VALVE LVOT Vmax:   90.70 cm/s LVOT Vmean:  70.500 cm/s LVOT VTI:    0.235 m  AORTA Ao Root diam: 2.60 cm MITRAL VALVE MV Area (PHT): 2.87 cm    SHUNTS MV Decel Time: 264 msec    Systemic VTI:  0.24 m MV E velocity: 72.00 cm/s  Systemic Diam: 1.80 cm MV A velocity: 61.30 cm/s MV E/A ratio:  1.17 Buford Dresser [name] Electronically signed by Buford Dresser [name] Signature Date/Time: 06/23/2019/12:20:30 PM    Final       Risk Assessment/Calculations:   {Does this patient have ATRIAL FIBRILLATION?:[id number]}     ASSESSMENT:    No diagnosis found.   PLAN:  In order of problems listed above:  Preop clearance for oblique lumbar interbody fusion 05/28/21 Dr. Melina Schools  History of SVT/nocturnal bradycardia  History of chest pain with coronary atherosclerosis on CT 06/2019, normal NST 06/24/19 EF 74%  OSA  Shared Decision Making/Informed Consent   {Are you ordering a CV Procedure (e.g. stress test, cath, DCCV, TEE, etc)?   Press F2        :[id number]}    Medication Adjustments/Labs and Tests Ordered: Current medicines are reviewed at length with the patient today.  Concerns regarding medicines are outlined above.  Medication changes, Labs and Tests ordered today are listed in the Patient Instructions below. There are no Patient Instructions on file for this visit.   Sumner Boast, [name]  04/24/2021 8:09 AM    [facility] [place], [place], [place]  [address] Phone: [phone]; Fax: [phone]

## 2021-04-25 ENCOUNTER — Other Ambulatory Visit: Payer: Self-pay

## 2021-04-29 DIAGNOSIS — D8481 Immunodeficiency due to conditions classified elsewhere: Secondary | ICD-10-CM | POA: Diagnosis not present

## 2021-04-29 DIAGNOSIS — D839 Common variable immunodeficiency, unspecified: Secondary | ICD-10-CM | POA: Diagnosis not present

## 2021-04-29 DIAGNOSIS — F3342 Major depressive disorder, recurrent, in full remission: Secondary | ICD-10-CM | POA: Diagnosis not present

## 2021-04-29 DIAGNOSIS — E039 Hypothyroidism, unspecified: Secondary | ICD-10-CM | POA: Diagnosis not present

## 2021-04-29 DIAGNOSIS — E785 Hyperlipidemia, unspecified: Secondary | ICD-10-CM | POA: Diagnosis not present

## 2021-04-29 DIAGNOSIS — K52832 Lymphocytic colitis: Secondary | ICD-10-CM | POA: Diagnosis not present

## 2021-04-29 DIAGNOSIS — J479 Bronchiectasis, uncomplicated: Secondary | ICD-10-CM | POA: Diagnosis not present

## 2021-04-29 DIAGNOSIS — J45909 Unspecified asthma, uncomplicated: Secondary | ICD-10-CM | POA: Diagnosis not present

## 2021-04-29 DIAGNOSIS — G4733 Obstructive sleep apnea (adult) (pediatric): Secondary | ICD-10-CM | POA: Diagnosis not present

## 2021-05-06 ENCOUNTER — Other Ambulatory Visit: Payer: Self-pay

## 2021-05-06 ENCOUNTER — Other Ambulatory Visit (INDEPENDENT_AMBULATORY_CARE_PROVIDER_SITE_OTHER): Payer: Medicare PPO

## 2021-05-06 ENCOUNTER — Ambulatory Visit: Payer: Medicare PPO | Admitting: Vascular Surgery

## 2021-05-06 ENCOUNTER — Encounter: Payer: Self-pay | Admitting: Vascular Surgery

## 2021-05-06 VITALS — BP 112/64 | HR 57 | Temp 97.2°F | Resp 16 | Ht 64.0 in | Wt 155.0 lb

## 2021-05-06 DIAGNOSIS — M549 Dorsalgia, unspecified: Secondary | ICD-10-CM | POA: Diagnosis not present

## 2021-05-06 DIAGNOSIS — R7989 Other specified abnormal findings of blood chemistry: Secondary | ICD-10-CM

## 2021-05-06 DIAGNOSIS — E559 Vitamin D deficiency, unspecified: Secondary | ICD-10-CM | POA: Diagnosis not present

## 2021-05-06 DIAGNOSIS — G8929 Other chronic pain: Secondary | ICD-10-CM | POA: Diagnosis not present

## 2021-05-06 LAB — CBC WITH DIFFERENTIAL/PLATELET
Basophils Absolute: 0 10*3/uL (ref 0.0–0.1)
Basophils Relative: 0.6 % (ref 0.0–3.0)
Eosinophils Absolute: 0.2 10*3/uL (ref 0.0–0.7)
Eosinophils Relative: 5.1 % — ABNORMAL HIGH (ref 0.0–5.0)
HCT: 37.4 % (ref 36.0–46.0)
Hemoglobin: 12.8 g/dL (ref 12.0–15.0)
Lymphocytes Relative: 34.4 % (ref 12.0–46.0)
Lymphs Abs: 1.5 10*3/uL (ref 0.7–4.0)
MCHC: 34.2 g/dL (ref 30.0–36.0)
MCV: 95.1 fl (ref 78.0–100.0)
Monocytes Absolute: 0.5 10*3/uL (ref 0.1–1.0)
Monocytes Relative: 11.2 % (ref 3.0–12.0)
Neutro Abs: 2.1 10*3/uL (ref 1.4–7.7)
Neutrophils Relative %: 48.7 % (ref 43.0–77.0)
Platelets: 146 10*3/uL — ABNORMAL LOW (ref 150.0–400.0)
RBC: 3.94 Mil/uL (ref 3.87–5.11)
RDW: 15.6 % — ABNORMAL HIGH (ref 11.5–15.5)
WBC: 4.3 10*3/uL (ref 4.0–10.5)

## 2021-05-06 LAB — VITAMIN D 25 HYDROXY (VIT D DEFICIENCY, FRACTURES): VITD: 29.16 ng/mL — ABNORMAL LOW (ref 30.00–100.00)

## 2021-05-06 NOTE — Progress Notes (Signed)
? ? ?Patient name: Beverly [name] MRN: [id number] DOB: 07-07-1945 Sex: female ? ?REASON FOR CONSULT: Evaluate for abdominal exposure for OLIF L3-L5 ? ?HPI: ?Beverly [name] is a 76 y.o. female, with history of hyperlipidemia and CVID that presents for evaluation of abdominal exposure for L3-L5 OLIF.  She has chronic lower back pain including right leg radiculopathy.  She has been evaluated by Dr. Rolena Infante.  Ultimately he feels that her primary pathology is at L3-L4 and L4-L5.  He has asked vascular surgery to assist with abdominal exposure for OLIF at L3-L5.  Patient's previous abdominal surgery includes lap cholecystectomy, two C-sections, hysterectomy, bilateral inguinal hernia repair that was initially done laparoscopic converted to open. ? ?Past Medical History:  ?Diagnosis Date  ? Allergy   ? Amaurosis fugax   ? negative w/u through [facility] right eye  ? Asthma   ? no attacks in several yrs per pt on 03-03-2021  ? Back pain   ? Dr Joline Salt 02/2010-epidural injection x 2 at L4-5 with good effect  ? 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  ? right sholder  ? BCC (basal cell carcinoma of skin) 05/09/2014  ? mid lower back  ? BCC (basal cell carcinoma of skin) 05/03/2017  ? right low back  ? BCC (basal cell carcinoma of skin) 05/03/2017  ? left upper back  ? BCC (basal cell carcinoma of skin) 07/05/2018  ? left mid back  ? BCC (basal cell carcinoma of skin) 05/20/1992  ? upper back  ? BCC (basal cell carcinoma of skin) 07/29/1993  ? left sholder medial  ? BCC (basal cell carcinoma of skin) 07/29/1993  ? left sholder lateral  ? BCC (basal cell carcinoma of skin) 07/29/1993  ? right thigh  ? BCC (basal cell carcinoma of skin) 07/29/1993  ? right sholder  ? BCC (basal cell carcinoma of skin) 12/22/1994  ? right mid forearm  ? BCC (basal cell carcinoma of skin) 12/22/1994  ? right upper forearm  ? BCC (basal cell carcinoma of skin) 12/22/1994  ? lower right upper arm  ? BCC (basal cell carcinoma of skin) 12/22/1994  ?  right upper arm sholder  ? BCC (basal cell carcinoma of skin) 08/11/1995  ? left leg below knee  ? BCC (basal cell carcinoma of skin) 04/11/2002  ? mid back  ? BCC (basal cell carcinoma of skin) 12/10/2002  ? right center upper back  ? BCC (basal cell carcinoma of skin) 05/26/2005  ? right post sholder  ? BCC (basal cell carcinoma) 05/09/2014  ? left inner shin  ? BCC (basal cell carcinoma) 06/12/2014  ? left forearm  ? Bowen's disease 10/07/1994  ? right post knee, right inner forearm/wrist  ? Bowen's disease 08/11/1995  ? left sholder  ? Bronchiectasis ([place])   ? Cataract   ? left  ? Chronic pain   ? Common variable immunodeficiency ([place])   ? sees dr Gayleen Orem 02-11-2021 epic  ? Depression   ? hx of  ? Diverticulosis of colon 1998  ? mild  ? DJD (degenerative joint disease)   ? Duodenal ulcer   ? h/o yrs ago  ? GERD (gastroesophageal reflux disease)   ? History of SCC (squamous cell carcinoma) of skin   ? Dr. Denna Haggard  ? History of sinus bradycardia   ? HLD (hyperlipidemia)   ? hypertriglyceridemia  ? Hypertensive retinopathy of both eyes 11/18/2017  ? Hypothyroidism   ? IBS (irritable bowel syndrome)   ? Internal hemorrhoids 1998  ? Lymphocytic  colitis   ? Mitral regurgitation   ? mild  ? Nocardiosis   ? relased by infection disease dec 2022  ? Osteoarthritis   ? feet,shoulder,neck,back,hips and hands.  ? Pneumonia 2015  ? PONV (postoperative nausea and vomiting)   ? Rotator cuff tear, right 02/2019  ? infraspinatus and supraspinatus, and dislocation of long head of bicep tendon (Dr. Alvan Dame)  ? SCC (squamous cell carcinoma) 11/26/2014  ? left hand, right hand, right deltoid mnay areas  ? SCC (squamous cell carcinoma) 05/03/2017  ? left cheek  ? Sleep apnea   ? uses a mouth guard nightly  ? Squamous cell carcinoma in situ (SCCIS) 07/05/2018  ? left hand  ? Tracheobronchomalacia   ? Vitamin D deficiency   ? mild  ? ? ?Past Surgical History:  ?Procedure Laterality Date  ? ABDOMINAL HYSTERECTOMY  1998  ? complete  ?  BLEPHAROPLASTY Bilateral 01/2018  ? BROW LIFT Bilateral 10/10/2018  ? Procedure: BROW LIFT;  Surgeon: Wallace Going, DO;  Location: [place];  Service: Plastics;  Laterality: Bilateral;  ? BUNIONECTOMY    ? R 12/08, L 2004 (Dr. Janus Molder)  ? CARPAL TUNNEL RELEASE  1989  ? bilateral  ? CARPAL TUNNEL RELEASE Left 03/06/2021  ? Procedure: Left Revision Carpal Tunnel Release with hypothenar fat pad flap;  Surgeon: [name], MD;  Location: [place];  Service: Orthopedics;  Laterality: Left;  with local anesthesia  ? CATARACT EXTRACTION Bilateral Left in 08/2012, Right 04/2016  ? Dr.Hecker  ? [place]  ? CHOLECYSTECTOMY  1992  ? laparoscopic  ? CLOSED REDUCTION NASAL FRACTURE N/A 08/29/2018  ? Procedure: CLOSED REDUCTION NASAL FRACTURE;  Surgeon: Wallace Going, DO;  Location: [place];  Service: Plastics;  Laterality: N/A;  1 hour, please  ? COLONOSCOPY  2006  ? COLONOSCOPY  01/2012  ? due again 01/2021; mild diverticulosis  ? epidural steroid injection, back  02/2010  ? HIP SURGERY    ? right bursectomy x 3  ? HIP SURGERY Right 2000  ? torn cartilage, repaired  ? INGUINAL HERNIA REPAIR  10/2007  ? bilat  ? NECK SURGERY  1989  ? c6-7 cervical laminectomy and diskecotmy  ? REVERSE SHOULDER ARTHROPLASTY Right 04/11/2019  ? Procedure: REVERSE SHOULDER ARTHROPLASTY;  Surgeon: Nicholes Stairs, MD;  Location: [place];  Service: Orthopedics;  Laterality: Right;  [place] Block  ? SHOULDER SURGERY Left 03/2003  ? left rotator cuff repair  ? SHOULDER SURGERY Left 03/14/2018  ? rotator cuff repair; Dr. Tonita Cong  ? TONSILLECTOMY  age 82  ? [place]  ? TUBAL LIGATION  1986  ? WISDOM TOOTH EXTRACTION    ? as teenager  ? ? ?Family History  ?Problem Relation Age of Onset  ? Depression Mother   ? Schizophrenia Mother   ? Cancer Father   ?     oral  ? Heart disease Father   ?     bradycardia  ? Esophageal cancer  Father   ? Cancer Sister   ?     skin and lung  ? COPD Sister   ? Hypertension Sister   ? Osteoporosis Sister   ?     compression fx's x 3 11/2018  ? Hyperthyroidism Brother   ? Cancer Brother   ?     metastatic cancer to bone (?primary)  ? Diabetes Maternal Grandmother   ? Cancer Paternal  Grandmother 3  ?     colon cancer, metastatic to liver  ? Colon cancer Paternal Grandmother   ? Liver cancer Paternal Grandmother   ? Hyperlipidemia Daughter   ? Asthma Son   ? ADD / ADHD Other   ? ? ?SOCIAL HISTORY: ?Social History  ? ?Socioeconomic History  ? Marital status: Divorced  ?  Spouse name: Not on file  ? Number of children: 2  ? Years of education: Not on file  ? Highest education level: Not on file  ?Occupational History  ? Occupation: STAFF NURSE  ?  Employer: [employer]  ?Tobacco Use  ? Smoking status: Never  ? Smokeless tobacco: Never  ?Vaping Use  ? Vaping Use: Never used  ?Substance and Sexual Activity  ? Alcohol use: Yes  ?  Alcohol/week: 7.0 standard drinks  ?  Types: 7 Glasses of wine per week  ?  Comment: 1 glass wine per day  ? Drug use: No  ? Sexual activity: Not Currently  ?Other Topics Concern  ? Not on file  ?Social History Narrative  ? 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 Son lives in [place] (2 grandchildren), daughter lives in [place], [place].  ? 2020--worked for [employer] part-time  ? ?Social Determinants of Health  ? ?Financial Resource Strain: Not on file  ?Food Insecurity: Not on file  ?Transportation Needs: Not on file  ?Physical Activity: Not on file  ?Stress: Not on file  ?Social Connections: Not on file  ?Intimate Partner Violence: Not At Risk  ? Fear of Current or Ex-Partner: No  ? Emotionally Abused: No  ? Physically Abused: No  ? Sexually Abused: No  ? ? ?Allergies  ?Allergen Reactions  ? Cat Hair Extract   ?  Other reaction(s): Wheezing  ? Short Ragweed Pollen Ext   ? Adhesive [Tape] Rash  ?  Codeine Rash  ? ? ? ? ?REVIEW OF SYSTEMS:  ?'[X]'$  denotes positive finding, '[ ]'$  denotes negative finding ?Cardiac  Comments:  ?Chest pain or chest pressure:    ?Shortness of breath upon exertion:    ?Short of br

## 2021-05-07 ENCOUNTER — Ambulatory Visit: Payer: Medicare PPO | Admitting: Physician Assistant

## 2021-05-07 DIAGNOSIS — G4733 Obstructive sleep apnea (adult) (pediatric): Secondary | ICD-10-CM

## 2021-05-07 DIAGNOSIS — I700 Atherosclerosis of aorta: Secondary | ICD-10-CM

## 2021-05-07 DIAGNOSIS — Z01818 Encounter for other preprocedural examination: Secondary | ICD-10-CM

## 2021-05-07 DIAGNOSIS — I471 Supraventricular tachycardia: Secondary | ICD-10-CM

## 2021-05-07 DIAGNOSIS — E782 Mixed hyperlipidemia: Secondary | ICD-10-CM

## 2021-05-08 ENCOUNTER — Other Ambulatory Visit (HOSPITAL_COMMUNITY): Payer: Self-pay

## 2021-05-08 ENCOUNTER — Encounter: Payer: Self-pay | Admitting: Internal Medicine

## 2021-05-08 ENCOUNTER — Other Ambulatory Visit: Payer: Self-pay | Admitting: Internal Medicine

## 2021-05-08 DIAGNOSIS — E039 Hypothyroidism, unspecified: Secondary | ICD-10-CM

## 2021-05-08 MED ORDER — LEVOTHYROXINE SODIUM 25 MCG PO TABS
25.0000 ug | ORAL_TABLET | Freq: Every day | ORAL | 1 refills | Status: DC
  Filled 2021-05-08: qty 90, 90d supply, fill #0
  Filled 2021-08-14: qty 90, 90d supply, fill #1

## 2021-05-08 NOTE — Progress Notes (Addendum)
? ?Cardiology Office Note   ? ?Date:  05/13/2021  ? ?ID:  Beverly [name], DOB 04/04/1945, MRN [id number] ? ? ?PCP:  Beverly [name], Rayford Halsted, MD ?  ?[place]  ?Cardiologist:  Beverly Rouge, MD   ?Advanced Practice Provider:  No care team member to display ?Electrophysiologist:  None  ? ?[id number]}  ? ?Chief Complaint  ?Patient presents with  ? Follow-up  ? ? ?History of Present Illness:  ?Beverly [name] is a 76 y.o. female with history of coronary calcification on CT 06/2019,SVT/nocturnal bradycardia, chest pain with normal NST 06/2019, HLD, OSA, ILD with severe tracheobronchomalacia followed by pulm. ? ?Patient last saw Beverly [name] 06/2019 and doing well. ? ?Patient on my schedule today for preop clearance for lumbar surgery 05/28/21 Dr. Rolena [name] with abdominal exposure for OLIF at L3-L5 with Dr. Carlis [name]. ? ?Patient denies any further chest pain, palpitations, edema. Chronic Sob with ILD. No family history of CAD, nonsmoker, no HTN.  LDL 70 11/2020 on lipitor. ? ? ? ? ? ?Past Medical History:  ?Diagnosis Date  ? Allergy   ? Amaurosis fugax   ? negative w/u through [facility] right eye  ? Asthma   ? no attacks in several yrs per pt on 03-03-2021  ? Back pain   ? Dr Beverly [name] 02/2010-epidural injection x 2 at L4-5 with good effect  ? 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  ? right sholder  ? BCC (basal cell carcinoma of skin) 05/09/2014  ? mid lower back  ? BCC (basal cell carcinoma of skin) 05/03/2017  ? right low back  ? BCC (basal cell carcinoma of skin) 05/03/2017  ? left upper back  ? BCC (basal cell carcinoma of skin) 07/05/2018  ? left mid back  ? BCC (basal cell carcinoma of skin) 05/20/1992  ? upper back  ? BCC (basal cell carcinoma of skin) 07/29/1993  ? left sholder medial  ? BCC (basal cell carcinoma of skin) 07/29/1993  ? left sholder lateral  ? BCC (basal cell carcinoma of skin) 07/29/1993  ? right thigh  ? BCC (basal cell carcinoma of skin) 07/29/1993  ? right sholder  ? BCC (basal cell  carcinoma of skin) 12/22/1994  ? right mid forearm  ? BCC (basal cell carcinoma of skin) 12/22/1994  ? right upper forearm  ? BCC (basal cell carcinoma of skin) 12/22/1994  ? lower right upper arm  ? BCC (basal cell carcinoma of skin) 12/22/1994  ? right upper arm sholder  ? BCC (basal cell carcinoma of skin) 08/11/1995  ? left leg below knee  ? BCC (basal cell carcinoma of skin) 04/11/2002  ? mid back  ? BCC (basal cell carcinoma of skin) 12/10/2002  ? right center upper back  ? BCC (basal cell carcinoma of skin) 05/26/2005  ? right post sholder  ? BCC (basal cell carcinoma) 05/09/2014  ? left inner shin  ? BCC (basal cell carcinoma) 06/12/2014  ? left forearm  ? Bowen's disease 10/07/1994  ? right post knee, right inner forearm/wrist  ? Bowen's disease 08/11/1995  ? left sholder  ? Bronchiectasis ([place])   ? Cataract   ? left  ? Chronic pain   ? Common variable immunodeficiency ([place])   ? sees dr Gayleen [name] 02-11-2021 epic  ? Depression   ? hx of  ? Diverticulosis of colon 1998  ? mild  ? DJD (degenerative joint disease)   ? Duodenal ulcer   ? h/o yrs ago  ? GERD (gastroesophageal reflux disease)   ?  History of SCC (squamous cell carcinoma) of skin   ? Beverly [name]  ? History of sinus bradycardia   ? HLD (hyperlipidemia)   ? hypertriglyceridemia  ? Hypertensive retinopathy of both eyes 11/18/2017  ? Hypothyroidism   ? IBS (irritable bowel syndrome)   ? Internal hemorrhoids 1998  ? Lymphocytic colitis   ? Mitral regurgitation   ? mild  ? Nocardiosis   ? relased by infection disease dec 2022  ? Osteoarthritis   ? feet,shoulder,neck,back,hips and hands.  ? Pneumonia 2015  ? PONV (postoperative nausea and vomiting)   ? Rotator cuff tear, right 02/2019  ? infraspinatus and supraspinatus, and dislocation of long head of bicep tendon (Dr. Alvan Dame)  ? SCC (squamous cell carcinoma) 11/26/2014  ? left hand, right hand, right deltoid mnay areas  ? SCC (squamous cell carcinoma) 05/03/2017  ? left cheek  ? Sleep apnea   ? uses a mouth  guard nightly  ? Squamous cell carcinoma in situ (SCCIS) 07/05/2018  ? left hand  ? Tracheobronchomalacia   ? Vitamin D deficiency   ? mild  ? ? ?Past Surgical History:  ?Procedure Laterality Date  ? ABDOMINAL HYSTERECTOMY  1998  ? complete  ? BLEPHAROPLASTY Bilateral 01/2018  ? BROW LIFT Bilateral 10/10/2018  ? Procedure: BROW LIFT;  Surgeon: Beverly Going, DO;  Location: [place];  Service: Plastics;  Laterality: Bilateral;  ? BUNIONECTOMY    ? R 12/08, L 2004 (Dr. Janus Molder)  ? CARPAL TUNNEL RELEASE  1989  ? bilateral  ? CARPAL TUNNEL RELEASE Left 03/06/2021  ? Procedure: Left Revision Carpal Tunnel Release with hypothenar fat pad flap;  Surgeon: Beverly Desanctis, MD;  Location: [place];  Service: Orthopedics;  Laterality: Left;  with local anesthesia  ? CATARACT EXTRACTION Bilateral Left in 08/2012, Right 04/2016  ? BeverlyHecker  ? Beverly [name]  ? CHOLECYSTECTOMY  1992  ? laparoscopic  ? CLOSED REDUCTION NASAL FRACTURE N/A 08/29/2018  ? Procedure: CLOSED REDUCTION NASAL FRACTURE;  Surgeon: Beverly Going, DO;  Location: [place];  Service: Plastics;  Laterality: N/A;  1 hour, please  ? COLONOSCOPY  2006  ? COLONOSCOPY  01/2012  ? due again 01/2021; mild diverticulosis  ? epidural steroid injection, back  02/2010  ? HIP SURGERY    ? right bursectomy x 3  ? HIP SURGERY Right 2000  ? torn cartilage, repaired  ? INGUINAL HERNIA REPAIR  10/2007  ? bilat  ? NECK SURGERY  1989  ? c6-7 cervical laminectomy and diskecotmy  ? REVERSE SHOULDER ARTHROPLASTY Right 04/11/2019  ? Procedure: REVERSE SHOULDER ARTHROPLASTY;  Surgeon: Beverly Stairs, MD;  Location: [place];  Service: Orthopedics;  Laterality: Right;  [place]  ? SHOULDER SURGERY Left 03/2003  ? left rotator cuff repair  ? SHOULDER SURGERY Left 03/14/2018  ? rotator cuff repair; Beverly [name]  ? TONSILLECTOMY  age 26  ? [place]  ? TUBAL LIGATION   1986  ? WISDOM TOOTH EXTRACTION    ? as teenager  ? ? ?Current Medications: ?Current Meds  ?Medication Sig  ? acetaminophen (TYLENOL) 500 MG tablet Take 1,000 mg by mouth every 6 (six) hours as needed for moderate pain.   ? albuterol (VENTOLIN HFA) 108 (90 Base) MCG/ACT inhaler Inhale 2 puffs into the lungs every 6 (six) hours as needed for wheezing or shortness of breath.  ? aspirin EC 81 MG tablet Take  81 mg by mouth daily.  ? atorvastatin (LIPITOR) 40 MG tablet atorvastatin 40 mg tablet ?  40 mg by oral route.  ? Buprenorphine HCl (BELBUCA) 450 MCG FILM Place 1 film to inside of mouth every twelve hours  ? Calcium Carbonate-Vitamin D 600-200 MG-UNIT TABS Take 1 tablet by mouth 2 (two) times daily.  ? celecoxib (CELEBREX) 200 MG capsule TAKE 1 CAPSULE BY MOUTH 2 TIMES DAILY  ? diclofenac Sodium (VOLTAREN) 1 % GEL Apply 2 g topically 4 (four) times daily as needed (joint pain).  ? Docusate Sodium 100 MG capsule 1 capsule daily  ? EPINEPHrine 0.3 mg/0.3 mL IJ SOAJ injection SMARTSIG:0.3 Milliliter(s) IM PRN  ? esomeprazole (NEXIUM) 40 MG capsule TAKE 1 CAPSULE BY MOUTH DAILY IN THE MORNING  ? famotidine (PEPCID) 40 MG tablet TAKE 1 TABLET BY MOUTH AT BEDTIME.  ? ferrous sulfate 324 MG TBEC Take 324 mg by mouth daily with breakfast.  ? Fluorouracil (TOLAK) 4 % CREA Apply to aa total 28 applications & sun protect  ? gabapentin (NEURONTIN) 800 MG tablet Take 1 tablet by mouth at bedtime.  ? guaiFENesin (MUCINEX) 600 MG 12 hr tablet Mucinex 600 mg tablet, extended release ?  600 mg by oral route.  ? HYDROcodone-acetaminophen (NORCO) 7.5-325 MG tablet Take 1 tablet by mouth four times a day as needed for pain  ? Immune Globulin, Human,-klhw (XEMBIFY) 4 GM/20ML SOLN Xembify 4 gram/20 mL (20 %) subcutaneous solution ? INJECT 10G SUBCUTANEOUSLY EVERY WEEK  ? ipratropium (ATROVENT) 0.03 % nasal spray Place 1 spray into both nostrils 3 times daily.  ? Lactobacillus-Inulin (CULTURELLE ADULT ULT BALANCE) CAPS Take 1 capsule by  mouth daily.  ? levothyroxine (SYNTHROID) 25 MCG tablet Take 1 tablet (25 mcg total) by mouth daily.  ? loperamide (IMODIUM A-D) 2 MG tablet loperamide 2 mg tablet ?  2 mg by oral route.  ? Melatonin-

## 2021-05-08 NOTE — H&P (View-Only) (Signed)
? ?Cardiology Office Note   ? ?Date:  05/13/2021  ? ?ID:  Beverly [name], DOB [birth year], MRN [id number] ? ? ?PCP:  Isaac Bliss, Rayford Halsted, MD ?  ?[place]  ?Cardiologist:  [name], MD   ?Advanced Practice Provider:  No care team member to display ?Electrophysiologist:  None  ? ?[id number]}  ? ?Chief Complaint  ?Patient presents with  ? Follow-up  ? ? ?History of Present Illness:  ?Beverly [name] is a 76 y.o. female with history of coronary calcification on CT 06/2019,SVT/nocturnal bradycardia, chest pain with normal NST 06/2019, HLD, OSA, ILD with severe tracheobronchomalacia followed by pulm. ? ?Patient last saw Dr. Johnsie Cancel 06/2019 and doing well. ? ?Patient on my schedule today for preop clearance for lumbar surgery 05/28/21 Dr. Rolena Infante with abdominal exposure for OLIF at L3-L5 with Dr. Carlis Abbott. ? ?Patient denies any further chest pain, palpitations, edema. Chronic Sob with ILD. No family history of CAD, nonsmoker, no HTN.  LDL 70 11/2020 on lipitor. ? ? ? ? ? ?Past Medical History:  ?Diagnosis Date  ? Allergy   ? Amaurosis fugax   ? negative w/u through [facility] right eye  ? Asthma   ? no attacks in several yrs per pt on 03-03-2021  ? Back pain   ? Dr Joline Salt 02/2010-epidural injection x 2 at L4-5 with good effect  ? 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  ? right sholder  ? BCC (basal cell carcinoma of skin) 05/09/2014  ? mid lower back  ? BCC (basal cell carcinoma of skin) 05/03/2017  ? right low back  ? BCC (basal cell carcinoma of skin) 05/03/2017  ? left upper back  ? BCC (basal cell carcinoma of skin) 07/05/2018  ? left mid back  ? BCC (basal cell carcinoma of skin) 05/20/1992  ? upper back  ? BCC (basal cell carcinoma of skin) 07/29/1993  ? left sholder medial  ? BCC (basal cell carcinoma of skin) 07/29/1993  ? left sholder lateral  ? BCC (basal cell carcinoma of skin) 07/29/1993  ? right thigh  ? BCC (basal cell carcinoma of skin) 07/29/1993  ? right sholder  ? BCC (basal cell  carcinoma of skin) 12/22/1994  ? right mid forearm  ? BCC (basal cell carcinoma of skin) 12/22/1994  ? right upper forearm  ? BCC (basal cell carcinoma of skin) 12/22/1994  ? lower right upper arm  ? BCC (basal cell carcinoma of skin) 12/22/1994  ? right upper arm sholder  ? BCC (basal cell carcinoma of skin) 08/11/1995  ? left leg below knee  ? BCC (basal cell carcinoma of skin) 04/11/2002  ? mid back  ? BCC (basal cell carcinoma of skin) 12/10/2002  ? right center upper back  ? BCC (basal cell carcinoma of skin) 05/26/2005  ? right post sholder  ? BCC (basal cell carcinoma) 05/09/2014  ? left inner shin  ? BCC (basal cell carcinoma) 06/12/2014  ? left forearm  ? Bowen's disease 10/07/1994  ? right post knee, right inner forearm/wrist  ? Bowen's disease 08/11/1995  ? left sholder  ? Bronchiectasis ([place])   ? Cataract   ? left  ? Chronic pain   ? Common variable immunodeficiency ([place])   ? sees dr Gayleen Orem 02-11-2021 epic  ? Depression   ? hx of  ? Diverticulosis of colon 1998  ? mild  ? DJD (degenerative joint disease)   ? Duodenal ulcer   ? h/o yrs ago  ? GERD (gastroesophageal reflux disease)   ?  History of SCC (squamous cell carcinoma) of skin   ? Dr. Denna Haggard  ? History of sinus bradycardia   ? HLD (hyperlipidemia)   ? hypertriglyceridemia  ? Hypertensive retinopathy of both eyes 11/18/2017  ? Hypothyroidism   ? IBS (irritable bowel syndrome)   ? Internal hemorrhoids 1998  ? Lymphocytic colitis   ? Mitral regurgitation   ? mild  ? Nocardiosis   ? relased by infection disease dec 2022  ? Osteoarthritis   ? feet,shoulder,neck,back,hips and hands.  ? Pneumonia 2015  ? PONV (postoperative nausea and vomiting)   ? Rotator cuff tear, right 02/2019  ? infraspinatus and supraspinatus, and dislocation of long head of bicep tendon (Dr. Alvan Dame)  ? SCC (squamous cell carcinoma) 11/26/2014  ? left hand, right hand, right deltoid mnay areas  ? SCC (squamous cell carcinoma) 05/03/2017  ? left cheek  ? Sleep apnea   ? uses a mouth  guard nightly  ? Squamous cell carcinoma in situ (SCCIS) 07/05/2018  ? left hand  ? Tracheobronchomalacia   ? Vitamin D deficiency   ? mild  ? ? ?Past Surgical History:  ?Procedure Laterality Date  ? ABDOMINAL HYSTERECTOMY  1998  ? complete  ? BLEPHAROPLASTY Bilateral 01/2018  ? BROW LIFT Bilateral 10/10/2018  ? Procedure: BROW LIFT;  Surgeon: Wallace Going, DO;  Location: [facility];  Service: Plastics;  Laterality: Bilateral;  ? BUNIONECTOMY    ? R 12/08, L 2004 (Dr. Janus Molder)  ? CARPAL TUNNEL RELEASE  1989  ? bilateral  ? CARPAL TUNNEL RELEASE Left 03/06/2021  ? Procedure: Left Revision Carpal Tunnel Release with hypothenar fat pad flap;  Surgeon: [name], MD;  Location: [facility];  Service: Orthopedics;  Laterality: Left;  with local anesthesia  ? CATARACT EXTRACTION Bilateral Left in 08/2012, Right 04/2016  ? Dr.Hecker  ? [place]  ? CHOLECYSTECTOMY  1992  ? laparoscopic  ? CLOSED REDUCTION NASAL FRACTURE N/A 08/29/2018  ? Procedure: CLOSED REDUCTION NASAL FRACTURE;  Surgeon: Wallace Going, DO;  Location: [facility];  Service: Plastics;  Laterality: N/A;  1 hour, please  ? COLONOSCOPY  2006  ? COLONOSCOPY  01/2012  ? due again 01/2021; mild diverticulosis  ? epidural steroid injection, back  02/2010  ? HIP SURGERY    ? right bursectomy x 3  ? HIP SURGERY Right 2000  ? torn cartilage, repaired  ? INGUINAL HERNIA REPAIR  10/2007  ? bilat  ? NECK SURGERY  1989  ? c6-7 cervical laminectomy and diskecotmy  ? REVERSE SHOULDER ARTHROPLASTY Right 04/11/2019  ? Procedure: REVERSE SHOULDER ARTHROPLASTY;  Surgeon: Nicholes Stairs, MD;  Location: [facility];  Service: Orthopedics;  Laterality: Right;  [place]  ? SHOULDER SURGERY Left 03/2003  ? left rotator cuff repair  ? SHOULDER SURGERY Left 03/14/2018  ? rotator cuff repair; Dr. Tonita Cong  ? TONSILLECTOMY  age 72  ? [place]  ? TUBAL LIGATION   1986  ? WISDOM TOOTH EXTRACTION    ? as teenager  ? ? ?Current Medications: ?Current Meds  ?Medication Sig  ? acetaminophen (TYLENOL) 500 MG tablet Take 1,000 mg by mouth every 6 (six) hours as needed for moderate pain.   ? albuterol (VENTOLIN HFA) 108 (90 Base) MCG/ACT inhaler Inhale 2 puffs into the lungs every 6 (six) hours as needed for wheezing or shortness of breath.  ? aspirin EC 81 MG tablet Take  81 mg by mouth daily.  ? atorvastatin (LIPITOR) 40 MG tablet atorvastatin 40 mg tablet ?  40 mg by oral route.  ? Buprenorphine HCl (BELBUCA) 450 MCG FILM Place 1 film to inside of mouth every twelve hours  ? Calcium Carbonate-Vitamin D 600-200 MG-UNIT TABS Take 1 tablet by mouth 2 (two) times daily.  ? celecoxib (CELEBREX) 200 MG capsule TAKE 1 CAPSULE BY MOUTH 2 TIMES DAILY  ? diclofenac Sodium (VOLTAREN) 1 % GEL Apply 2 g topically 4 (four) times daily as needed (joint pain).  ? Docusate Sodium 100 MG capsule 1 capsule daily  ? EPINEPHrine 0.3 mg/0.3 mL IJ SOAJ injection SMARTSIG:0.3 Milliliter(s) IM PRN  ? esomeprazole (NEXIUM) 40 MG capsule TAKE 1 CAPSULE BY MOUTH DAILY IN THE MORNING  ? famotidine (PEPCID) 40 MG tablet TAKE 1 TABLET BY MOUTH AT BEDTIME.  ? ferrous sulfate 324 MG TBEC Take 324 mg by mouth daily with breakfast.  ? Fluorouracil (TOLAK) 4 % CREA Apply to aa total 28 applications & sun protect  ? gabapentin (NEURONTIN) 800 MG tablet Take 1 tablet by mouth at bedtime.  ? guaiFENesin (MUCINEX) 600 MG 12 hr tablet Mucinex 600 mg tablet, extended release ?  600 mg by oral route.  ? HYDROcodone-acetaminophen (NORCO) 7.5-325 MG tablet Take 1 tablet by mouth four times a day as needed for pain  ? Immune Globulin, Human,-klhw (XEMBIFY) 4 GM/20ML SOLN Xembify 4 gram/20 mL (20 %) subcutaneous solution ? INJECT 10G SUBCUTANEOUSLY EVERY WEEK  ? ipratropium (ATROVENT) 0.03 % nasal spray Place 1 spray into both nostrils 3 times daily.  ? Lactobacillus-Inulin (CULTURELLE ADULT ULT BALANCE) CAPS Take 1 capsule by  mouth daily.  ? levothyroxine (SYNTHROID) 25 MCG tablet Take 1 tablet (25 mcg total) by mouth daily.  ? loperamide (IMODIUM A-D) 2 MG tablet loperamide 2 mg tablet ?  2 mg by oral route.  ? Melatonin-

## 2021-05-09 ENCOUNTER — Other Ambulatory Visit: Payer: Self-pay

## 2021-05-10 ENCOUNTER — Other Ambulatory Visit (HOSPITAL_COMMUNITY): Payer: Self-pay

## 2021-05-12 ENCOUNTER — Other Ambulatory Visit: Payer: Self-pay | Admitting: Internal Medicine

## 2021-05-12 ENCOUNTER — Other Ambulatory Visit (HOSPITAL_COMMUNITY): Payer: Self-pay

## 2021-05-12 DIAGNOSIS — E559 Vitamin D deficiency, unspecified: Secondary | ICD-10-CM

## 2021-05-12 DIAGNOSIS — T7840XA Allergy, unspecified, initial encounter: Secondary | ICD-10-CM

## 2021-05-12 MED ORDER — VITAMIN D (ERGOCALCIFEROL) 1.25 MG (50000 UNIT) PO CAPS
50000.0000 [IU] | ORAL_CAPSULE | ORAL | 0 refills | Status: AC
  Filled 2021-05-12: qty 12, 84d supply, fill #0

## 2021-05-12 MED ORDER — BELBUCA 450 MCG BU FILM
ORAL_FILM | BUCCAL | 2 refills | Status: DC
  Filled 2021-05-12: qty 60, 30d supply, fill #0

## 2021-05-13 ENCOUNTER — Ambulatory Visit: Payer: Medicare PPO | Admitting: Physician Assistant

## 2021-05-13 ENCOUNTER — Encounter: Payer: Self-pay | Admitting: Physician Assistant

## 2021-05-13 ENCOUNTER — Other Ambulatory Visit: Payer: Self-pay

## 2021-05-13 ENCOUNTER — Other Ambulatory Visit (HOSPITAL_COMMUNITY): Payer: Self-pay

## 2021-05-13 VITALS — BP 128/64 | HR 64 | Ht 64.0 in | Wt 156.0 lb

## 2021-05-13 DIAGNOSIS — I2510 Atherosclerotic heart disease of native coronary artery without angina pectoris: Secondary | ICD-10-CM

## 2021-05-13 DIAGNOSIS — I495 Sick sinus syndrome: Secondary | ICD-10-CM

## 2021-05-13 DIAGNOSIS — G4733 Obstructive sleep apnea (adult) (pediatric): Secondary | ICD-10-CM

## 2021-05-13 DIAGNOSIS — I471 Supraventricular tachycardia: Secondary | ICD-10-CM

## 2021-05-13 DIAGNOSIS — Z01818 Encounter for other preprocedural examination: Secondary | ICD-10-CM | POA: Diagnosis not present

## 2021-05-13 NOTE — Patient Instructions (Signed)
Medication Instructions:  ?Your physician recommends that you continue on your current medications as directed. Please refer to the Current Medication list given to you today. ? ?*If you need a refill on your cardiac medications before your next appointment, please call your pharmacy* ? ? ?Lab Work: ?TODAY: BMET ?If you have labs (blood work) drawn today and your tests are completely normal, you will receive your results only by: ?MyChart Message (if you have MyChart) OR ?A paper copy in the mail ?If you have any lab test that is abnormal or we need to change your treatment, we will call you to review the results. ? ? ?Testing/Procedures: ?Coronary CTA ? ? ?Follow-Up: ?At [facility], you and your health needs are our priority.  As part of our continuing mission to provide you with exceptional heart care, we have created designated Provider Care Teams.  These Care Teams include your primary Cardiologist (physician) and Advanced Practice Providers (APPs -  Physician Assistants and Nurse Practitioners) who all work together to provide you with the care you need, when you need it. ? ?We recommend signing up for the patient portal called "MyChart".  Sign up information is provided on this After Visit Summary.  MyChart is used to connect with patients for Virtual Visits (Telemedicine).  Patients are able to view lab/test results, encounter notes, upcoming appointments, etc.  Non-urgent messages can be sent to your provider as well.   ?To learn more about what you can do with MyChart, go to [email or web address].   ? ?Your next appointment:   ?1 year(s) ? ?The format for your next appointment:   ?In Person ? ?Provider:   ?[name], MD   ? ? ?Other Instructions ?See letter for CT instructions  ?

## 2021-05-14 ENCOUNTER — Telehealth (HOSPITAL_COMMUNITY): Payer: Self-pay | Admitting: Emergency Medicine

## 2021-05-14 ENCOUNTER — Telehealth (HOSPITAL_COMMUNITY): Payer: Self-pay | Admitting: *Deleted

## 2021-05-14 LAB — BASIC METABOLIC PANEL
BUN/Creatinine Ratio: 22 (ref 12–28)
BUN: 13 mg/dL (ref 8–27)
CO2: 25 mmol/L (ref 20–29)
Calcium: 9.2 mg/dL (ref 8.7–10.3)
Chloride: 108 mmol/L — ABNORMAL HIGH (ref 96–106)
Creatinine, Ser: 0.58 mg/dL (ref 0.57–1.00)
Glucose: 98 mg/dL (ref 70–99)
Potassium: 4.3 mmol/L (ref 3.5–5.2)
Sodium: 146 mmol/L — ABNORMAL HIGH (ref 134–144)
eGFR: 95 mL/min/{1.73_m2} (ref >59)

## 2021-05-14 NOTE — Telephone Encounter (Signed)
Attempted to call patient regarding upcoming cardiac CT appointment. °Left message on voicemail with name and callback number ° °Glynnis Gavel RN Navigator Cardiac Imaging °[facility] °[phone] Office °[phone] Cell ° °

## 2021-05-14 NOTE — Telephone Encounter (Signed)
Reaching out to patient to offer assistance regarding upcoming cardiac imaging study; pt verbalizes understanding of appt date/time, parking situation and where to check in, pre-test NPO status and medications ordered, and verified current allergies; name and call back number provided for further questions should they arise ?Marchia Bond RN Navigator Cardiac Imaging ?[facility] ?[phone] office ?[phone] cell ? ?Denies iv issues ?Daily meds, states HR usually in 71s  ?Arrival 1130 ?

## 2021-05-15 DIAGNOSIS — G894 Chronic pain syndrome: Secondary | ICD-10-CM | POA: Diagnosis not present

## 2021-05-15 DIAGNOSIS — M150 Primary generalized (osteo)arthritis: Secondary | ICD-10-CM | POA: Diagnosis not present

## 2021-05-15 DIAGNOSIS — Z79891 Long term (current) use of opiate analgesic: Secondary | ICD-10-CM | POA: Diagnosis not present

## 2021-05-15 DIAGNOSIS — M47816 Spondylosis without myelopathy or radiculopathy, lumbar region: Secondary | ICD-10-CM | POA: Diagnosis not present

## 2021-05-16 ENCOUNTER — Ambulatory Visit (HOSPITAL_COMMUNITY)
Admission: RE | Admit: 2021-05-16 | Discharge: 2021-05-16 | Disposition: A | Discharge status: Home / Self Care | Payer: Medicare PPO | Source: Ambulatory Visit | Attending: Physician Assistant | Admitting: Physician Assistant

## 2021-05-16 ENCOUNTER — Telehealth: Payer: Self-pay

## 2021-05-16 ENCOUNTER — Other Ambulatory Visit: Payer: Self-pay | Admitting: Internal Medicine

## 2021-05-16 DIAGNOSIS — Z01818 Encounter for other preprocedural examination: Secondary | ICD-10-CM | POA: Diagnosis not present

## 2021-05-16 DIAGNOSIS — I495 Sick sinus syndrome: Secondary | ICD-10-CM | POA: Insufficient documentation

## 2021-05-16 DIAGNOSIS — I2510 Atherosclerotic heart disease of native coronary artery without angina pectoris: Secondary | ICD-10-CM | POA: Insufficient documentation

## 2021-05-16 DIAGNOSIS — R931 Abnormal findings on diagnostic imaging of heart and coronary circulation: Secondary | ICD-10-CM | POA: Diagnosis not present

## 2021-05-16 DIAGNOSIS — I471 Supraventricular tachycardia: Secondary | ICD-10-CM | POA: Insufficient documentation

## 2021-05-16 MED ORDER — NITROGLYCERIN 0.4 MG SL SUBL
SUBLINGUAL_TABLET | SUBLINGUAL | Status: AC
  Filled 2021-05-16: qty 2

## 2021-05-16 MED ORDER — NITROGLYCERIN 0.4 MG SL SUBL
0.8000 mg | SUBLINGUAL_TABLET | Freq: Once | SUBLINGUAL | Status: AC
  Administered 2021-05-16: 0.8 mg via SUBLINGUAL

## 2021-05-16 MED ORDER — IOHEXOL 350 MG/ML SOLN
95.0000 mL | Freq: Once | INTRAVENOUS | Status: AC | PRN
Start: 2021-05-16 — End: 2021-05-16
  Administered 2021-05-16: 95 mL via INTRAVENOUS

## 2021-05-16 NOTE — Addendum Note (Signed)
Addended by: Imogene Burn on: 05/16/2021 05:46 PM ? ? Modules accepted: Orders ? ?

## 2021-05-16 NOTE — Telephone Encounter (Signed)
-----   Message from Imogene Burn, PA-C sent at 05/16/2021  5:29 PM EDT ----- ?I discussed this result with Dr. Margaretann Loveless and Dr. Johnsie Cancel who recommend we cancel her surgery and schedule a left heart cath next week. Patient is agreeable to this.Please arrange Left heart cath for Monday if possible. Thanks. Let me know who with and i'll write orders ?

## 2021-05-16 NOTE — Telephone Encounter (Signed)
?  Called and reviewed the following information below with the patient. She verbalized understanding and thanked me for the call.  ? ? ?Beverly [name] ?[place] OFFICE ?[address], [address] ?[place] [address] ?Dept: [phone] ?Loc: [phone] ? ?Beverly [name]  05/16/2021 ? ?You are scheduled for a Cardiac Catheterization on Monday, April 3 with Dr. Harrell Gave End. ? ?1. Please arrive at the Main Entrance A at [facility]: [place], [place] [address] at 7:00 AM (This time is two hours before your procedure to ensure your preparation). Free valet parking service is available.  ? ?Special note: Every effort is made to have your procedure done on time. Please understand that emergencies sometimes delay scheduled procedures. ? ?2. Diet: Do not eat solid foods after midnight.  You may have clear liquids until 5 AM upon the day of the procedure. ? ?3. Labs: CBC done 3/21 and BMET done 3/28 ? ?EKG performed 3/28 ? ?4. Medication instructions in preparation for your procedure: ? ? Contrast Allergy: No ? ? ?On the morning of your procedure, take Aspirin and any of your regular morning medicines.  You may use sips of water. ? ?5. Plan to go home the same day, you will only stay overnight if medically necessary. ?6. You MUST have a responsible adult to drive you home. ?7. An adult MUST be with you the first 24 hours after you arrive home. ?8. Bring a current list of your medications, and the last time and date medication taken. ?9. Bring ID and current insurance cards. ?10.Please wear clothes that are easy to get on and off and wear slip-on shoes. ? ?Thank you for allowing [place] to care for you! ?  -- [facility] ? ?

## 2021-05-17 ENCOUNTER — Ambulatory Visit (HOSPITAL_COMMUNITY)
Admission: RE | Admit: 2021-05-17 | Discharge: 2021-05-17 | Disposition: A | Discharge status: Home / Self Care | Payer: Medicare PPO | Source: Ambulatory Visit | Attending: Internal Medicine | Admitting: Internal Medicine

## 2021-05-17 DIAGNOSIS — I2510 Atherosclerotic heart disease of native coronary artery without angina pectoris: Secondary | ICD-10-CM | POA: Diagnosis not present

## 2021-05-17 DIAGNOSIS — R931 Abnormal findings on diagnostic imaging of heart and coronary circulation: Secondary | ICD-10-CM

## 2021-05-17 DIAGNOSIS — I495 Sick sinus syndrome: Secondary | ICD-10-CM | POA: Diagnosis not present

## 2021-05-17 DIAGNOSIS — Z01818 Encounter for other preprocedural examination: Secondary | ICD-10-CM | POA: Diagnosis not present

## 2021-05-17 DIAGNOSIS — I471 Supraventricular tachycardia: Secondary | ICD-10-CM | POA: Diagnosis not present

## 2021-05-19 ENCOUNTER — Observation Stay (HOSPITAL_COMMUNITY)
Admission: RE | Admit: 2021-05-19 | Discharge: 2021-05-20 | Disposition: A | Discharge status: Home / Self Care | Payer: Medicare PPO | Source: Ambulatory Visit | Attending: Internal Medicine | Admitting: Internal Medicine

## 2021-05-19 ENCOUNTER — Encounter (HOSPITAL_COMMUNITY)
Admission: RE | Disposition: A | Discharge status: Home / Self Care | Payer: Medicare PPO | Source: Ambulatory Visit | Attending: Internal Medicine

## 2021-05-19 DIAGNOSIS — G4733 Obstructive sleep apnea (adult) (pediatric): Secondary | ICD-10-CM | POA: Diagnosis not present

## 2021-05-19 DIAGNOSIS — Z981 Arthrodesis status: Secondary | ICD-10-CM | POA: Diagnosis not present

## 2021-05-19 DIAGNOSIS — I471 Supraventricular tachycardia: Secondary | ICD-10-CM | POA: Diagnosis not present

## 2021-05-19 DIAGNOSIS — R931 Abnormal findings on diagnostic imaging of heart and coronary circulation: Secondary | ICD-10-CM

## 2021-05-19 DIAGNOSIS — R0602 Shortness of breath: Secondary | ICD-10-CM | POA: Diagnosis not present

## 2021-05-19 DIAGNOSIS — Z955 Presence of coronary angioplasty implant and graft: Secondary | ICD-10-CM

## 2021-05-19 DIAGNOSIS — I2510 Atherosclerotic heart disease of native coronary artery without angina pectoris: Secondary | ICD-10-CM | POA: Diagnosis not present

## 2021-05-19 DIAGNOSIS — Z01818 Encounter for other preprocedural examination: Secondary | ICD-10-CM

## 2021-05-19 DIAGNOSIS — J479 Bronchiectasis, uncomplicated: Secondary | ICD-10-CM | POA: Diagnosis not present

## 2021-05-19 DIAGNOSIS — I495 Sick sinus syndrome: Secondary | ICD-10-CM | POA: Diagnosis not present

## 2021-05-19 DIAGNOSIS — J849 Interstitial pulmonary disease, unspecified: Secondary | ICD-10-CM | POA: Diagnosis not present

## 2021-05-19 HISTORY — PX: LEFT HEART CATH AND CORONARY ANGIOGRAPHY: CATH118249

## 2021-05-19 HISTORY — PX: CORONARY STENT INTERVENTION: CATH118234

## 2021-05-19 HISTORY — PX: CORONARY ULTRASOUND/IVUS: CATH118244

## 2021-05-19 LAB — POCT ACTIVATED CLOTTING TIME
Activated Clotting Time: 251 seconds
Activated Clotting Time: 269 seconds
Activated Clotting Time: 281 seconds
Activated Clotting Time: 287 seconds

## 2021-05-19 SURGERY — LEFT HEART CATH AND CORONARY ANGIOGRAPHY
Anesthesia: LOCAL

## 2021-05-19 MED ORDER — ONDANSETRON HCL 4 MG/2ML IJ SOLN: 4.0000 mg | Freq: Four times a day (QID) | INTRAMUSCULAR | Status: DC | PRN

## 2021-05-19 MED ORDER — DOCUSATE SODIUM 100 MG PO CAPS
100.0000 mg | ORAL_CAPSULE | Freq: Every day | ORAL | Status: DC
  Administered 2021-05-19: 100 mg via ORAL
  Filled 2021-05-19: qty 1

## 2021-05-19 MED ORDER — GUAIFENESIN ER 600 MG PO TB12: 600.0000 mg | ORAL_TABLET | Freq: Two times a day (BID) | ORAL | Status: DC | PRN

## 2021-05-19 MED ORDER — IPRATROPIUM BROMIDE 0.06 % NA SOLN
1.0000 | Freq: Three times a day (TID) | NASAL | Status: DC
  Administered 2021-05-20: 1 via NASAL
  Filled 2021-05-19: qty 15

## 2021-05-19 MED ORDER — SODIUM CHLORIDE 0.9% FLUSH: 3.0000 mL | INTRAVENOUS | Status: DC | PRN

## 2021-05-19 MED ORDER — HYDROCODONE-ACETAMINOPHEN 7.5-325 MG PO TABS
1.0000 | ORAL_TABLET | Freq: Four times a day (QID) | ORAL | Status: DC | PRN
  Administered 2021-05-19 – 2021-05-20 (×2): 1 via ORAL
  Filled 2021-05-19 (×2): qty 1

## 2021-05-19 MED ORDER — IPRATROPIUM BROMIDE 0.03 % NA SOLN
1.0000 | Freq: Three times a day (TID) | NASAL | Status: DC
  Filled 2021-05-19 (×2): qty 30

## 2021-05-19 MED ORDER — LABETALOL HCL 5 MG/ML IV SOLN: 10.0000 mg | INTRAVENOUS | Status: AC | PRN

## 2021-05-19 MED ORDER — HEPARIN SODIUM (PORCINE) 1000 UNIT/ML IJ SOLN
INTRAMUSCULAR | Status: AC
  Filled 2021-05-19: qty 10

## 2021-05-19 MED ORDER — SODIUM CHLORIDE 0.9% FLUSH
3.0000 mL | Freq: Two times a day (BID) | INTRAVENOUS | Status: DC
  Administered 2021-05-19: 3 mL via INTRAVENOUS

## 2021-05-19 MED ORDER — FAMOTIDINE 20 MG PO TABS
40.0000 mg | ORAL_TABLET | Freq: Every day | ORAL | Status: DC
Start: 2021-05-19 — End: 2021-05-20
  Administered 2021-05-19: 40 mg via ORAL
  Filled 2021-05-19: qty 2

## 2021-05-19 MED ORDER — ALUM & MAG HYDROXIDE-SIMETH 200-200-20 MG/5ML PO SUSP
30.0000 mL | Freq: Once | ORAL | Status: AC
Start: 2021-05-19 — End: 2021-05-19
  Administered 2021-05-19: 30 mL via ORAL
  Filled 2021-05-19: qty 30

## 2021-05-19 MED ORDER — PANTOPRAZOLE SODIUM 40 MG PO TBEC
40.0000 mg | DELAYED_RELEASE_TABLET | Freq: Every day | ORAL | Status: DC
  Administered 2021-05-20: 40 mg via ORAL
  Filled 2021-05-19: qty 1

## 2021-05-19 MED ORDER — NITROGLYCERIN 1 MG/10 ML FOR IR/CATH LAB
INTRA_ARTERIAL | Status: DC | PRN
  Administered 2021-05-19 (×4): 200 ug via INTRACORONARY

## 2021-05-19 MED ORDER — SODIUM CHLORIDE 0.9 % WEIGHT BASED INFUSION: 1.0000 mL/kg/h | INTRAVENOUS | Status: DC

## 2021-05-19 MED ORDER — HEPARIN (PORCINE) IN NACL 1000-0.9 UT/500ML-% IV SOLN
INTRAVENOUS | Status: AC
  Filled 2021-05-19: qty 500

## 2021-05-19 MED ORDER — VERAPAMIL HCL 2.5 MG/ML IV SOLN
INTRAVENOUS | Status: AC
  Filled 2021-05-19: qty 2

## 2021-05-19 MED ORDER — FAMOTIDINE IN NACL 20-0.9 MG/50ML-% IV SOLN
INTRAVENOUS | Status: AC
  Filled 2021-05-19: qty 50

## 2021-05-19 MED ORDER — IOHEXOL 350 MG/ML SOLN
INTRAVENOUS | Status: DC | PRN
  Administered 2021-05-19: 200 mL

## 2021-05-19 MED ORDER — FAMOTIDINE IN NACL 20-0.9 MG/50ML-% IV SOLN
INTRAVENOUS | Status: AC | PRN
  Administered 2021-05-19: 20 mg via INTRAVENOUS

## 2021-05-19 MED ORDER — VERAPAMIL HCL 2.5 MG/ML IV SOLN
INTRAVENOUS | Status: DC | PRN
  Administered 2021-05-19: 10 mL via INTRA_ARTERIAL

## 2021-05-19 MED ORDER — ACETAMINOPHEN 325 MG PO TABS: 650.0000 mg | ORAL_TABLET | ORAL | Status: DC | PRN

## 2021-05-19 MED ORDER — OMEGA-3-ACID ETHYL ESTERS 1 G PO CAPS
2.0000 g | ORAL_CAPSULE | Freq: Two times a day (BID) | ORAL | Status: DC
  Administered 2021-05-20: 2 g via ORAL
  Filled 2021-05-19: qty 2

## 2021-05-19 MED ORDER — ASPIRIN EC 81 MG PO TBEC
81.0000 mg | DELAYED_RELEASE_TABLET | Freq: Every day | ORAL | Status: DC
  Administered 2021-05-20: 81 mg via ORAL
  Filled 2021-05-19: qty 1

## 2021-05-19 MED ORDER — ENOXAPARIN SODIUM 40 MG/0.4ML IJ SOSY
40.0000 mg | PREFILLED_SYRINGE | INTRAMUSCULAR | Status: DC
  Administered 2021-05-20: 40 mg via SUBCUTANEOUS
  Filled 2021-05-19: qty 0.4

## 2021-05-19 MED ORDER — GABAPENTIN 800 MG PO TABS
800.0000 mg | ORAL_TABLET | Freq: Every day | ORAL | Status: DC
  Filled 2021-05-19: qty 1

## 2021-05-19 MED ORDER — FERROUS SULFATE 325 (65 FE) MG PO TABS
324.0000 mg | ORAL_TABLET | Freq: Every day | ORAL | Status: DC
  Administered 2021-05-20: 324 mg via ORAL
  Filled 2021-05-19: qty 1

## 2021-05-19 MED ORDER — CLOPIDOGREL BISULFATE 75 MG PO TABS
75.0000 mg | ORAL_TABLET | Freq: Every day | ORAL | Status: DC
Start: 2021-05-20 — End: 2021-05-20
  Administered 2021-05-20: 75 mg via ORAL
  Filled 2021-05-19: qty 1

## 2021-05-19 MED ORDER — ASPIRIN 81 MG PO CHEW: 81.0000 mg | CHEWABLE_TABLET | ORAL | Status: DC

## 2021-05-19 MED ORDER — MIDAZOLAM HCL 2 MG/2ML IJ SOLN
INTRAMUSCULAR | Status: AC
  Filled 2021-05-19: qty 2

## 2021-05-19 MED ORDER — PAROXETINE HCL 20 MG PO TABS
60.0000 mg | ORAL_TABLET | Freq: Every day | ORAL | Status: DC
Start: 2021-05-19 — End: 2021-05-20
  Administered 2021-05-19 – 2021-05-20 (×2): 60 mg via ORAL
  Filled 2021-05-19 (×2): qty 3

## 2021-05-19 MED ORDER — SODIUM CHLORIDE 0.9 % WEIGHT BASED INFUSION
3.0000 mL/kg/h | INTRAVENOUS | Status: DC
  Administered 2021-05-19: 3 mL/kg/h via INTRAVENOUS

## 2021-05-19 MED ORDER — ALBUTEROL SULFATE (2.5 MG/3ML) 0.083% IN NEBU: 2.5000 mg | INHALATION_SOLUTION | Freq: Four times a day (QID) | RESPIRATORY_TRACT | Status: DC | PRN

## 2021-05-19 MED ORDER — GABAPENTIN 400 MG PO CAPS
800.0000 mg | ORAL_CAPSULE | Freq: Every day | ORAL | Status: DC
  Administered 2021-05-19: 800 mg via ORAL
  Filled 2021-05-19: qty 2

## 2021-05-19 MED ORDER — HEPARIN SODIUM (PORCINE) 1000 UNIT/ML IJ SOLN
INTRAMUSCULAR | Status: DC | PRN
  Administered 2021-05-19: 2000 [IU] via INTRAVENOUS
  Administered 2021-05-19 (×2): 3500 [IU] via INTRAVENOUS
  Administered 2021-05-19 (×2): 2000 [IU] via INTRAVENOUS

## 2021-05-19 MED ORDER — SODIUM CHLORIDE 0.9 % IV SOLN: 250.0000 mL | INTRAVENOUS | Status: DC | PRN

## 2021-05-19 MED ORDER — HEPARIN (PORCINE) IN NACL 1000-0.9 UT/500ML-% IV SOLN
INTRAVENOUS | Status: DC | PRN
  Administered 2021-05-19 (×2): 500 mL

## 2021-05-19 MED ORDER — SODIUM CHLORIDE 0.9% FLUSH: 3.0000 mL | Freq: Two times a day (BID) | INTRAVENOUS | Status: DC

## 2021-05-19 MED ORDER — HYDRALAZINE HCL 20 MG/ML IJ SOLN: 10.0000 mg | INTRAMUSCULAR | Status: AC | PRN

## 2021-05-19 MED ORDER — LIDOCAINE HCL (PF) 1 % IJ SOLN
INTRAMUSCULAR | Status: AC
  Filled 2021-05-19: qty 30

## 2021-05-19 MED ORDER — NITROGLYCERIN 1 MG/10 ML FOR IR/CATH LAB
INTRA_ARTERIAL | Status: AC
  Filled 2021-05-19: qty 10

## 2021-05-19 MED ORDER — LIDOCAINE HCL (PF) 1 % IJ SOLN
INTRAMUSCULAR | Status: DC | PRN
  Administered 2021-05-19: 2 mL

## 2021-05-19 MED ORDER — ATORVASTATIN CALCIUM 40 MG PO TABS: 40.0000 mg | ORAL_TABLET | Freq: Every day | ORAL | Status: DC

## 2021-05-19 MED ORDER — SODIUM CHLORIDE 0.9 % IV SOLN: INTRAVENOUS | Status: AC

## 2021-05-19 MED ORDER — FENTANYL CITRATE (PF) 100 MCG/2ML IJ SOLN
INTRAMUSCULAR | Status: DC | PRN
  Administered 2021-05-19 (×3): 25 ug via INTRAVENOUS

## 2021-05-19 MED ORDER — CLOPIDOGREL BISULFATE 75 MG PO TABS: 75.0000 mg | ORAL_TABLET | Freq: Every day | ORAL | Status: DC

## 2021-05-19 MED ORDER — CELECOXIB 200 MG PO CAPS
200.0000 mg | ORAL_CAPSULE | Freq: Two times a day (BID) | ORAL | Status: DC
Start: 2021-05-19 — End: 2021-05-20
  Administered 2021-05-19 – 2021-05-20 (×2): 200 mg via ORAL
  Filled 2021-05-19 (×3): qty 1

## 2021-05-19 MED ORDER — BUPRENORPHINE HCL 450 MCG BU FILM: 450.0000 ug | ORAL_FILM | Freq: Two times a day (BID) | BUCCAL | Status: DC

## 2021-05-19 MED ORDER — LEVOTHYROXINE SODIUM 25 MCG PO TABS
25.0000 ug | ORAL_TABLET | Freq: Every day | ORAL | Status: DC
  Administered 2021-05-20: 25 ug via ORAL
  Filled 2021-05-19: qty 1

## 2021-05-19 MED ORDER — METHOCARBAMOL 500 MG PO TABS: 500.0000 mg | ORAL_TABLET | Freq: Three times a day (TID) | ORAL | Status: DC | PRN

## 2021-05-19 MED ORDER — MELATONIN 5 MG PO TABS
10.0000 mg | ORAL_TABLET | Freq: Every evening | ORAL | Status: DC | PRN
  Administered 2021-05-19: 10 mg via ORAL
  Filled 2021-05-19: qty 2

## 2021-05-19 MED ORDER — ALUM & MAG HYDROXIDE-SIMETH 200-200-20 MG/5ML PO SUSP
ORAL | Status: AC
  Filled 2021-05-19: qty 30

## 2021-05-19 MED ORDER — MIDAZOLAM HCL 2 MG/2ML IJ SOLN
INTRAMUSCULAR | Status: DC | PRN
Start: 2021-05-19 — End: 2021-05-19
  Administered 2021-05-19 (×2): 1 mg via INTRAVENOUS

## 2021-05-19 MED ORDER — MONTELUKAST SODIUM 10 MG PO TABS
10.0000 mg | ORAL_TABLET | Freq: Every day | ORAL | Status: DC
  Administered 2021-05-19: 10 mg via ORAL
  Filled 2021-05-19: qty 1

## 2021-05-19 MED ORDER — FENTANYL CITRATE (PF) 100 MCG/2ML IJ SOLN
INTRAMUSCULAR | Status: AC
  Filled 2021-05-19: qty 2

## 2021-05-19 MED ORDER — MELATONIN 5 MG PO TABS: 5.0000 mg | ORAL_TABLET | Freq: Every evening | ORAL | Status: DC | PRN

## 2021-05-19 MED ORDER — CLOPIDOGREL BISULFATE 300 MG PO TABS
ORAL_TABLET | ORAL | Status: DC | PRN
  Administered 2021-05-19: 600 mg via ORAL

## 2021-05-19 SURGICAL SUPPLY — 26 items
BALL SAPPHIRE NC24 3.0X22 (BALLOONS) ×2
BALLN SAPPHIRE 2.0X12 (BALLOONS) ×2
BALLN SAPPHIRE 2.5X15 (BALLOONS) ×2
BALLN SAPPHIRE ~~LOC~~ 2.25X15 (BALLOONS) ×1 IMPLANT
BALLN SAPPHIRE ~~LOC~~ 3.25X8 (BALLOONS) ×1 IMPLANT
BALLOON SAPPHIRE 2.0X12 (BALLOONS) IMPLANT
BALLOON SAPPHIRE 2.5X15 (BALLOONS) IMPLANT
BALLOON SAPPHIRE NC24 3.0X22 (BALLOONS) IMPLANT
BAND ZEPHYR COMPRESS 30 LONG (HEMOSTASIS) ×1 IMPLANT
CATH 5FR JL3.5 JR4 ANG PIG MP (CATHETERS) ×1 IMPLANT
CATH OPTICROSS HD (CATHETERS) ×1 IMPLANT
CATH VISTA GUIDE 6FR XBLAD3.5 (CATHETERS) ×1 IMPLANT
GLIDESHEATH SLEND SS 6F .021 (SHEATH) ×1 IMPLANT
GUIDEWIRE INQWIRE 1.5J.035X260 (WIRE) IMPLANT
INQWIRE 1.5J .035X260CM (WIRE) ×2
KIT ENCORE 26 ADVANTAGE (KITS) ×1 IMPLANT
KIT HEART LEFT (KITS) ×2 IMPLANT
PACK CARDIAC CATHETERIZATION (CUSTOM PROCEDURE TRAY) ×2 IMPLANT
SLED PULL BACK IVUS (MISCELLANEOUS) ×1 IMPLANT
STENT ONYX FRONTIER 2.0X22 (Permanent Stent) ×1 IMPLANT
STENT ONYX FRONTIER 2.75X38 (Permanent Stent) ×1 IMPLANT
SYR MEDRAD MARK 7 150ML (SYRINGE) ×2 IMPLANT
TRANSDUCER W/STOPCOCK (MISCELLANEOUS) ×2 IMPLANT
TUBING CIL FLEX 10 FLL-RA (TUBING) ×2 IMPLANT
WIRE HI TORQ BMW 190CM (WIRE) ×1 IMPLANT
WIRE RUNTHROUGH .014X180CM (WIRE) ×1 IMPLANT

## 2021-05-19 NOTE — Progress Notes (Addendum)
Dr. Saunders Revel at bedside, readjusted tr band to right wrist/radial area with assistance from RN and manual BP cuff, tolerated well, 10cc of air noted in tr band, rue remains elevated on pillow, safety maintained, ok to deflate tr band at original time of 1335 per Dr. Saunders Revel, bruising remains proximal and distal to tr band (forearm to base of thumb) ?

## 2021-05-19 NOTE — Interval H&P Note (Signed)
History and Physical Interval Note: ? ?05/19/2021 ?9:02 AM ? ?Beverly [name]  has presented today for surgery, with the diagnosis of shortness of breath and abnormal cardiac CTA.  The various methods of treatment have been discussed with the patient and family. After consideration of risks, benefits and other options for treatment, the patient has consented to  Procedure(s): ?LEFT HEART CATH AND CORONARY ANGIOGRAPHY (N/A) as a surgical intervention.  The patient's history has been reviewed, patient examined, no change in status, stable for surgery.  I have reviewed the patient's chart and labs.  Questions were answered to the patient's satisfaction.   ? ?Cath Lab Visit (complete for each Cath Lab visit) ? ?Clinical Evaluation Leading to the Procedure:  ? ?ACS: No. ? ?Non-ACS:   ? ?Anginal Classification: CCS II (chronic DOE) ? ?Anti-ischemic medical therapy: No Therapy ? ?Non-Invasive Test Results: High-risk stress test findings: cardiac mortality >3%/year (significant proximal LAD and ramus intermedius disease by cardiac CTA/ CT FFR) ? ?Prior CABG: No previous CABG ? ? ?[name] ? ? ?

## 2021-05-19 NOTE — Progress Notes (Signed)
Surgical Instructions ? ? ? Your procedure is scheduled on Wednesday, April 12th. ? Report to [facility] Main Entrance "A" at 6:30 A.M., then check in with the Admitting office. ? Call this number if you have problems the morning of surgery: ? [phone] ? ? If you have any questions prior to your surgery date call [phone]: Open Monday-Friday 8am-4pm ? ? ? Remember: ? Do not eat after midnight the night before your surgery ? ?You may drink clear liquids until 5:30 AM the morning of your surgery.   ?Clear liquids allowed are: Water, Non-Citrus Juices (without pulp), Carbonated Beverages, Clear Tea, Black Coffee ONLY (NO MILK, CREAM OR POWDERED CREAMER of any kind), and Gatorade ?  ? Take these medicines the morning of surgery with A SIP OF WATER:  ? Atorvastatin (Lipitor) ? Docusate Sodium ? Esomeprazole (Nexium) ? Levothyroxine (Synthroid) ? Paroxetine (Paxil) ? Methocarbamol (Robaxin) ? ? If needed: ? Tylenol ? Albuterol inhaler - bring with you on day of surgery ? Epi Pen ? Mucinex ? Hydrocodone-Acetaminophen (Norco) ? Zofran ?  ? ?Follow your surgeon's instructions on when to stop Aspirin.  If no instructions were given by your surgeon then you will need to call the office to get those instructions.   ? ?As of today, STOP taking any Aleve, Naproxen, Ibuprofen, Motrin, Advil, Goody's, BC's, all herbal medications, fish oil, and all vitamins. ? ?         ?DAY OF SURGERY: ?Do not wear jewelry or makeup ?Do not wear lotions, powders, perfumes, or deodorant. ?Do not shave 48 hours prior to surgery.   ?Do not bring valuables to the hospital. ?Do not wear nail polish, gel polish, artificial nails, or any other type of covering on natural nails (fingers and toes) ?If you have artificial nails or gel coating that need to be removed by a nail salon, please have this removed prior to surgery. Artificial nails or gel coating may interfere with anesthesia's ability to adequately monitor your vital signs. ? ?[facility]  is not responsible for any belongings or valuables. .  ? ?Do NOT Smoke (Tobacco/Vaping)  24 hours prior to your procedure ? ?If you use a CPAP at night, you may bring your mask for your overnight stay. ?  ?Contacts, glasses, hearing aids, dentures or partials may not be worn into surgery, please bring cases for these belongings ?  ?For patients admitted to the hospital, discharge time will be determined by your treatment team. ?  ?Patients discharged the day of surgery will not be allowed to drive home, and someone needs to stay with them for 24 hours. ? ? ?SURGICAL WAITING ROOM VISITATION ?Patients having surgery or a procedure in a hospital may have two support people. ?Children under the age of 61 must have an adult with them who is not the patient. ?They may stay in the waiting area during the procedure and may switch out with other visitors. If the patient needs to stay at the hospital during part of their recovery, the visitor guidelines for inpatient rooms apply. ? ?Please refer to the [facility] website for the visitor guidelines for Inpatients (after your surgery is over and you are in a regular room).  ? ? ?Special instructions:   ? ?Oral Hygiene is also important to reduce your risk of infection.  Remember - BRUSH YOUR TEETH THE MORNING OF SURGERY WITH YOUR REGULAR TOOTHPASTE ? ? ?[facility]- Preparing For Surgery ? ?Before surgery, you can play an important role. Because skin  is not sterile, your skin needs to be as free of germs as possible. You can reduce the number of germs on your skin by washing with CHG (chlorahexidine gluconate) Soap before surgery.  CHG is an antiseptic cleaner which kills germs and bonds with the skin to continue killing germs even after washing.   ? ? ?Please do not use if you have an allergy to CHG or antibacterial soaps. If your skin becomes reddened/irritated stop using the CHG.  ?Do not shave (including legs and underarms) for at least 48 hours prior to first CHG shower.  It is OK to shave your face. ? ?Please follow these instructions carefully. ?  ? ? Shower the NIGHT BEFORE SURGERY and the MORNING OF SURGERY with CHG Soap.  ? If you chose to wash your hair, wash your hair first as usual with your normal shampoo. After you shampoo, rinse your hair and body thoroughly to remove the shampoo.  Then [employer] and genitals (private parts) with your normal soap and rinse thoroughly to remove soap. ? ?After that Use CHG Soap as you would any other liquid soap. You can apply CHG directly to the skin and wash gently with a scrungie or a clean washcloth.  ? ?Apply the CHG Soap to your body ONLY FROM THE NECK DOWN.  Do not use on open wounds or open sores. Avoid contact with your eyes, ears, mouth and genitals (private parts). Wash Face and genitals (private parts)  with your normal soap.  ? ?Wash thoroughly, paying special attention to the area where your surgery will be performed. ? ?Thoroughly rinse your body with warm water from the neck down. ? ?DO NOT shower/wash with your normal soap after using and rinsing off the CHG Soap. ? ?Pat yourself dry with a CLEAN TOWEL. ? ?Wear CLEAN PAJAMAS to bed the night before surgery ? ?Place CLEAN SHEETS on your bed the night before your surgery ? ?DO NOT SLEEP WITH PETS. ? ? ?Day of Surgery: ? ?Take a shower with CHG soap. ?Wear Clean/Comfortable clothing the morning of surgery ?Do not apply any deodorants/lotions.   ?Remember to brush your teeth WITH YOUR REGULAR TOOTHPASTE. ? ? ? ?If you received a COVID test during your pre-op visit  it is requested that you wear a mask when out in public, stay away from anyone that may not be feeling well and notify your surgeon if you develop symptoms. If you have been in contact with anyone that has tested positive in the last 10 days please notify you surgeon. ? ?  ?Please read over the following fact sheets that you were given.  ? ?

## 2021-05-19 NOTE — Progress Notes (Addendum)
LOCATION: right RADIAL ? ?DEFLATED PER PROTOCOL: YES ? ?TIME BAND OFF/DRESSING APPLIED: 1625, gauze with tegaderm placed ? ?SITE UPON ARRIVAL: LEVEL 0 ? ?SITE AFTER BAND REMOVAL: LEVEL 0 ? ?CIRCULATION SENSATION AND MOVEMENT: +movement, +2 radial pulse ? ?COMMENTS:  bruising noted to distal and proximal area where tr band was, slight puffiness noted, but area soft, carpal tunnel surgery scars noted to bilateral wrist areas ?

## 2021-05-20 ENCOUNTER — Other Ambulatory Visit (HOSPITAL_COMMUNITY): Payer: Self-pay

## 2021-05-20 ENCOUNTER — Encounter (HOSPITAL_COMMUNITY): Payer: Self-pay | Admitting: Internal Medicine

## 2021-05-20 ENCOUNTER — Telehealth: Payer: Self-pay

## 2021-05-20 ENCOUNTER — Inpatient Hospital Stay (HOSPITAL_COMMUNITY)
Admission: RE | Admit: 2021-05-20 | Discharge: 2021-05-20 | Disposition: A | Discharge status: Home / Self Care | Payer: Medicare PPO | Source: Ambulatory Visit

## 2021-05-20 DIAGNOSIS — I471 Supraventricular tachycardia: Secondary | ICD-10-CM | POA: Diagnosis not present

## 2021-05-20 DIAGNOSIS — Z981 Arthrodesis status: Secondary | ICD-10-CM | POA: Diagnosis not present

## 2021-05-20 DIAGNOSIS — J849 Interstitial pulmonary disease, unspecified: Secondary | ICD-10-CM | POA: Diagnosis not present

## 2021-05-20 DIAGNOSIS — R0602 Shortness of breath: Secondary | ICD-10-CM | POA: Diagnosis not present

## 2021-05-20 DIAGNOSIS — I2510 Atherosclerotic heart disease of native coronary artery without angina pectoris: Secondary | ICD-10-CM

## 2021-05-20 DIAGNOSIS — G4733 Obstructive sleep apnea (adult) (pediatric): Secondary | ICD-10-CM | POA: Diagnosis not present

## 2021-05-20 DIAGNOSIS — R931 Abnormal findings on diagnostic imaging of heart and coronary circulation: Secondary | ICD-10-CM | POA: Diagnosis not present

## 2021-05-20 DIAGNOSIS — I495 Sick sinus syndrome: Secondary | ICD-10-CM | POA: Diagnosis not present

## 2021-05-20 LAB — CBC
HCT: 32.7 % — ABNORMAL LOW (ref 36.0–46.0)
Hemoglobin: 10.9 g/dL — ABNORMAL LOW (ref 12.0–15.0)
MCH: 32.1 pg (ref 26.0–34.0)
MCHC: 33.3 g/dL (ref 30.0–36.0)
MCV: 96.2 fL (ref 80.0–100.0)
Platelets: 125 10*3/uL — ABNORMAL LOW (ref 150–400)
RBC: 3.4 MIL/uL — ABNORMAL LOW (ref 3.87–5.11)
RDW: 14.4 % (ref 11.5–15.5)
WBC: 6 10*3/uL (ref 4.0–10.5)
nRBC: 0 % (ref 0.0–0.2)

## 2021-05-20 LAB — BASIC METABOLIC PANEL
Anion gap: 6 (ref 5–15)
BUN: 10 mg/dL (ref 8–23)
CO2: 25 mmol/L (ref 22–32)
Calcium: 8.8 mg/dL — ABNORMAL LOW (ref 8.9–10.3)
Chloride: 112 mmol/L — ABNORMAL HIGH (ref 98–111)
Creatinine, Ser: 0.54 mg/dL (ref 0.44–1.00)
GFR, Estimated: >60 mL/min (ref >60)
Glucose, Bld: 89 mg/dL (ref 70–99)
Potassium: 4.1 mmol/L (ref 3.5–5.1)
Sodium: 143 mmol/L (ref 135–145)

## 2021-05-20 MED ORDER — LOSARTAN POTASSIUM 25 MG PO TABS
12.5000 mg | ORAL_TABLET | Freq: Every day | ORAL | 0 refills | Status: DC
  Filled 2021-05-20: qty 45, 90d supply, fill #0
  Filled 2021-08-06: qty 45, 90d supply, fill #1

## 2021-05-20 MED ORDER — NITROGLYCERIN 0.4 MG SL SUBL
0.4000 mg | SUBLINGUAL_TABLET | SUBLINGUAL | 2 refills | Status: DC | PRN
  Filled 2021-05-20: qty 25, 5d supply, fill #0
  Filled 2021-08-29: qty 25, 5d supply, fill #1

## 2021-05-20 MED ORDER — CLOPIDOGREL BISULFATE 75 MG PO TABS
75.0000 mg | ORAL_TABLET | Freq: Every day | ORAL | 2 refills | Status: DC
  Filled 2021-05-20: qty 90, 90d supply, fill #0

## 2021-05-20 MED ORDER — PANTOPRAZOLE SODIUM 40 MG PO TBEC
40.0000 mg | DELAYED_RELEASE_TABLET | Freq: Every day | ORAL | 0 refills | Status: DC
  Filled 2021-05-20: qty 90, 90d supply, fill #0

## 2021-05-20 NOTE — Discharge Summary (Addendum)
?Discharge Summary  ?  ?Patient ID: Beverly [name] ?MRN: [id number]; DOB: 07-Jul-1945 ? ?Admit date: 05/19/2021 ?Discharge date: 05/20/2021 ? ?PCP:  Isaac Bliss, Rayford Halsted, MD ?  ?[facility] Providers ?Cardiologist:  [name], MD    ? ? ?Discharge Diagnoses  ?  ?Principal Problem: ?  Shortness of breath ?Active Problems: ?  Abnormal cardiac CT angiography ?  CAD (coronary artery disease) ? ? ?Diagnostic Studies/Procedures  ?  ?Cath: 05/19/21 ? ?Conclusions: ?Severe two-vessel coronary artery disease with up to 90% stenoses involving the ostial through mid LAD and ostial through proximal ramus intermedius.  Mild, nonobstructive CAD noted in the LCx and RCA. ?Normal left ventricular systolic function (LVEF 86-76%) with normal filling pressure (LVEDP 10 mmHg) ?Successful IVUS-guided PCI to ostial through mid LAD using Onyx Frontier 2.75 x 38 mm drug-eluting stent with 0% residual stenosis and TIMI-3 flow ?Successful PCI to ostial through proximal ramus intermedius using Onyx Frontier 2.0 x 22 mm drug-eluting stent with 0% residual stenosis and TIMI-3 flow. ?  ?Recommendations: ?Overnight extended recovery. ?Dual antiplatelet therapy with aspirin and clopidogrel, ideally for 6 months.  Nonemergent surgery will likely need to be delayed to allow for adequate antiplatelet therapy following two-vessel PCI. ?Aggressive secondary prevention. ?  ?Nelva Bush, MD ?[place] ? ?Diagnostic ?Dominance: Right ?Intervention ? ? ?_____________ ?  ?History of Present Illness   ?  ?Beverly [name] is a 76 y.o. female with with history of coronary calcification on CT 06/2019,SVT/nocturnal bradycardia, chest pain with normal NST 06/2019, HLD, OSA, ILD with severe tracheobronchomalacia followed by pulmonary. She was referred to the office for preop evaluation for potential lumbar surgery on 05/28/21 Dr. Rolena Infante with abdominal exposure for OLIF at L3-L5 with Dr. Carlis Abbott. ?  ?She denied any further chest pain, palpitations,  edema. Chronic Sob with ILD. No family history of CAD, nonsmoker, no HTN.  LDL 70 11/2020 on lipitor. She was sent for coronary CTA and calcium score. This suggested tight LAD and RI lesion with recommendations for outpatient cardiac cath.  ? ?Hospital Course  ?   ?Underwent cardiac catheterization with severe two-vessel CAD with 90% stenosis involving ostial to mid LAD along with ostial through proximal ramus intermedius.  Successful IVUS guided PCI to LAD with DES x1 along with PCI to ramus intermedius with DES x1.  Placed on DAPT with aspirin/Plavix for at least 6 months. No complications noted post cath. Switched from Nexium to Protonix. Also counseled on the use of celebrex and DAPT. She is unable to stop celebrex given her pain, but instructed to monitor for signs of bleeding. Seen by cardiac rehab. Adde low dose losartan 12.'5mg'$  daily at discharge. Instructed to follow BPs at home. Instructed she will need to hold off on any procedures for at least 6 months. ? ?Patient seen by Dr. Gwenlyn Found and deemed stable for discharge home. Follow up in the office has been arranged. Medications sent to the patient's pharmacy of choice.  ? ?Did the patient have an acute coronary syndrome (MI, NSTEMI, STEMI, etc) this admission?:  No                               ?Did the patient have a percutaneous coronary intervention (stent / angioplasty)?:  Yes.   ? ? ?Cath/PCI Registry Performance & Quality Measures: ?Aspirin prescribed? - Yes ?ADP Receptor Inhibitor (Plavix/Clopidogrel, Brilinta/Ticagrelor or Effient/Prasugrel) prescribed (includes medically managed patients)? - Yes ?High Intensity Statin (Lipitor 40-'80mg'$   or Crestor 20-'40mg'$ ) prescribed? - Yes ?For EF <40%, was ACEI/ARB prescribed? - Not Applicable (EF >/= 68%) ?For EF <40%, Aldosterone Antagonist (Spironolactone or Eplerenone) prescribed? - Not Applicable (EF >/= 12%) ?Cardiac Rehab Phase II ordered? - Yes  ? ?  ?The patient will be scheduled for a TOC follow up  appointment in 10-14 days.  A message has been sent to the [facility] and Scheduling Pool at the office where the patient should be seen for follow up.  ?_____________ ? ?Discharge Vitals ?Blood pressure (!) 109/49, pulse (!) 51, temperature 98.5 ?F (36.9 ?C), temperature source Oral, resp. rate (!) 21, height '5\' 4"'$  (1.626 m), weight 70.3 kg, SpO2 94 %.  Danley Danker Weights  ? 05/19/21 0701  ?Weight: 70.3 kg  ? ? ?Labs & Radiologic Studies  ?  ?CBC ?Recent Labs  ?  05/20/21 ?0217  ?WBC 6.0  ?HGB 10.9*  ?HCT 32.7*  ?MCV 96.2  ?PLT 125*  ? ?Basic Metabolic Panel ?Recent Labs  ?  05/20/21 ?0217  ?NA 143  ?K 4.1  ?CL 112*  ?CO2 25  ?GLUCOSE 89  ?BUN 10  ?CREATININE 0.54  ?CALCIUM 8.8*  ? ?Liver Function Tests ?No results for input(s): AST, ALT, ALKPHOS, BILITOT, PROT, ALBUMIN in the last 72 hours. ?No results for input(s): LIPASE, AMYLASE in the last 72 hours. ?High Sensitivity Troponin:   ?No results for input(s): TROPONINIHS in the last 720 hours.  ?BNP ?Invalid input(s): POCBNP ?D-Dimer ?No results for input(s): DDIMER in the last 72 hours. ?Hemoglobin A1C ?No results for input(s): HGBA1C in the last 72 hours. ?Fasting Lipid Panel ?No results for input(s): CHOL, HDL, LDLCALC, TRIG, CHOLHDL, LDLDIRECT in the last 72 hours. ?Thyroid Function Tests ?No results for input(s): TSH, T4TOTAL, T3FREE, THYROIDAB in the last 72 hours. ? ?Invalid input(s): FREET3 ?_____________  ?CARDIAC CATHETERIZATION ? ?Result Date: 05/19/2021 ?Conclusions: Severe two-vessel coronary artery disease with up to 90% stenoses involving the ostial through mid LAD and ostial through proximal ramus intermedius.  Mild, nonobstructive CAD noted in the LCx and RCA. Normal left ventricular systolic function (LVEF 75-17%) with normal filling pressure (LVEDP 10 mmHg) Successful IVUS-guided PCI to ostial through mid LAD using Onyx Frontier 2.75 x 38 mm drug-eluting stent with 0% residual stenosis and TIMI-3 flow Successful PCI to ostial through proximal ramus  intermedius using Onyx Frontier 2.0 x 22 mm drug-eluting stent with 0% residual stenosis and TIMI-3 flow. Recommendations: Overnight extended recovery. Dual antiplatelet therapy with aspirin and clopidogrel, ideally for 6 months.  Nonemergent surgery will likely need to be delayed to allow for adequate antiplatelet therapy following two-vessel PCI. Aggressive secondary prevention. Nelva Bush, MD [facility] ? ?CT CORONARY MORPH W/CTA COR W/SCORE W/CA W/CM &/OR WO/CM ? ?Addendum Date: 05/16/2021   ?ADDENDUM REPORT: 05/16/2021 17:03 HISTORY: Pre-op noncardiac surgery, moderate risk EXAM: Cardiac/Coronary  CT TECHNIQUE: The patient was scanned on a [employer]. PROTOCOL: A 120 kV prospective scan was triggered in the descending thoracic aorta at 111 HU's. Axial non-contrast 3 mm slices were carried out through the heart. The data set was analyzed on a dedicated work station and scored using the Agatston method. Gantry rotation speed was 250 msecs and collimation was .6 mm. Beta blockade and 0.8 mg of sl NTG was given. The 3D data set was reconstructed in 5% intervals of the 35-75 % of the R-R cycle. Systolic and diastolic phases were analyzed on a dedicated work station using MPR, MIP and VRT modes. The patient received 35m OMNIPAQUE  IOHEXOL 350 MG/ML SOLN contrast. FINDINGS: Image quality: Good Noise artifact is: Moderate misregistration due to respiratory motion artifact. Coronary calcium score is 31, which places the patient in the 49th percentile for age and sex matched control. Coronary arteries: Normal coronary origins.  Right dominance. Right Coronary Artery: Mild atherosclerotic plaque in the proximal RCA, 25-49% stenosis. Moderate mixed atherosclerotic plaque in the mid RCA, 50-69% stenosis. Left Main Coronary Artery: No detectable plaque or stenosis. Left Anterior Descending Coronary Artery: Severe, tubular mixed atherosclerotic plaque in the proximal LAD tortuous segment, 70-99% stenosis.  Long segment tubular stenosis extends to the mid LAD and appears at least moderate, 50-69% stenosis. Ramus intermedius: heavily calcified proximal RI with probable severe stenosis 70-99%. Left Circumflex Artery: Mode

## 2021-05-20 NOTE — Telephone Encounter (Signed)
**Note De-identified  Obfuscation** -----  **Note De-Identified  Obfuscation** Message from Cheryln Manly, NP sent at 05/20/2021 11:15 AM EDT ----- ?Regarding: TOC call ?Needs TOC call, thx ? ?

## 2021-05-20 NOTE — Progress Notes (Signed)
CARDIAC REHAB PHASE I  ? ?PRE:  Rate/Rhythm: 65 SR ? ?  BP: sitting 136/59 ? ?  SaO2:  ? ?MODE:  Ambulation: 400 ft  ? ?POST:  Rate/Rhythm: 70 SR ? ?  BP: sitting 135/80  ? ?  SaO2:  ? ?Pt ambulated slowly, has to sway due to back pain, keep a slow pace. No CP or other c/o.  ? ?Discussed stent, Plavix, restrictions, diet, NTG and CRPII. Did not give exercise guidelines due to limited ambulation ability. Hope is that she can do recumbent machines in CRPII. Will refer to [place]. ?[phone]  ? ?Yves Dill CES, ACSM ?05/20/2021 ?10:28 AM ? ? ? ? ?

## 2021-05-20 NOTE — Progress Notes (Addendum)
Had ambulated with pt to 200 feet. No c/o of chest pain noe SOB.  Will update Reino Bellis NP.  ? ?Pt had c/o of shooting chest pain with c/o of  feeling sweaty. BP 150/84, HR 59. Reino Bellis NP updated. EKG done per protocol.  ?Pt chest pain self resolved. No NTG SL given. Will update Cardiology accordingly.  ?

## 2021-05-21 ENCOUNTER — Ambulatory Visit: Payer: Medicare PPO | Admitting: Internal Medicine

## 2021-05-21 NOTE — Telephone Encounter (Signed)
**Note De-Identified  Obfuscation** Patient contacted regarding discharge from [facility] on 05/20/2021. ? ?Patient understands to follow up with provider Ermalinda Barrios, PA-c  on 06/03/2021 at 12:15 at [address] [place] in [place], [place] [address]. ? ?Patient understands discharge instructions? Yes ? ?Patient understands medications and regiment? Yes ? ?Patient understands to bring all medications to this visit? She states that she has so many pill bottles that she prefers to bring her a medication list with her and states it is accurate as she updates through [facility] when needed. ? ?Ask patient:  Are you enrolled in My Chart: Yes ? ?The pt reports that she has been doing well since she got home from the hospital yesterday and denies having any CP/discomfort, SOB, nausea, diaphoresis, dizziness or lightheadedness. ? ?She did have concerns about her hemoglobin level being low at 10. 9 and asked what she can do to increase her level. ?I recommended that she eat a iron rich diet that includes: ?meat and fish ?soy products, including tofu and edamame ?eggs ?dried fruits, such as dates and figs ?broccoli ?green leafy vegetables, such as kale and spinach ?green beans ?nuts and seeds ?peanut butter ? ?She thanked me for my assistance/recommendation and for this hospital f/u call. ? ?She verified that she does have [facility]'s phone number so she can call if she has any questions or concerns. ?

## 2021-05-22 ENCOUNTER — Encounter: Payer: Self-pay | Admitting: Cardiovascular Disease

## 2021-05-22 NOTE — Progress Notes (Signed)
? ?Cardiology Office Note   ? ?Date:  06/03/2021  ? ?ID:  Beverly [name], DOB 15-May-1945, MRN [id number] ? ? ?PCP:  Isaac Bliss, Rayford Halsted, MD ?  ?[place]  ?Cardiologist:  [name], MD   ?Advanced Practice Provider:  No care team member to display ?Electrophysiologist:  None  ? ?[id number]}  ? ?No chief complaint on file. ? ? ?History of Present Illness:  ?Beverly [name] is a 76 y.o. female  with history of coronary calcification on CT 06/2019,SVT/nocturnal bradycardia, chest pain with normal NST 06/2019, HLD, OSA, ILD with severe tracheobronchomalacia followed by pulmonary. I saw the patient for preop evaluation for potential lumbar surgery on 05/28/21 Dr. Rolena Infante with abdominal exposure for OLIF at L3-L5 with Dr. Carlis Abbott. ? ?I ordered Coronary CTA and this was suggestive of tight LAD and ramus. Surgery was canceled and she had cardiac cath 05/19/21 with DES LAD and DES ramus. Plan for Plavix/ASA for at least 6 months. Counseled on taking celebrex but she can't stop b/c of pain. Low dose losartan added.  ? ?Patient comes in for f/u. Feels good. Not walking much because of back issues. Denies chest pain. Dyspnea unchanged. Looking forward to starting cardiac rehab.  ? ? ?  ? ? ? ?Past Medical History:  ?Diagnosis Date  ? Allergy   ? Amaurosis fugax   ? negative w/u through [facility] right eye  ? Asthma   ? no attacks in several yrs per pt on 03-03-2021  ? Back pain   ? Dr Joline Salt 02/2010-epidural injection x 2 at L4-5 with good effect  ? 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  ? right sholder  ? BCC (basal cell carcinoma of skin) 05/09/2014  ? mid lower back  ? BCC (basal cell carcinoma of skin) 05/03/2017  ? right low back  ? BCC (basal cell carcinoma of skin) 05/03/2017  ? left upper back  ? BCC (basal cell carcinoma of skin) 07/05/2018  ? left mid back  ? BCC (basal cell carcinoma of skin) 05/20/1992  ? upper back  ? BCC (basal cell carcinoma of skin) 07/29/1993  ? left sholder medial  ?  BCC (basal cell carcinoma of skin) 07/29/1993  ? left sholder lateral  ? BCC (basal cell carcinoma of skin) 07/29/1993  ? right thigh  ? BCC (basal cell carcinoma of skin) 07/29/1993  ? right sholder  ? BCC (basal cell carcinoma of skin) 12/22/1994  ? right mid forearm  ? BCC (basal cell carcinoma of skin) 12/22/1994  ? right upper forearm  ? BCC (basal cell carcinoma of skin) 12/22/1994  ? lower right upper arm  ? BCC (basal cell carcinoma of skin) 12/22/1994  ? right upper arm sholder  ? BCC (basal cell carcinoma of skin) 08/11/1995  ? left leg below knee  ? BCC (basal cell carcinoma of skin) 04/11/2002  ? mid back  ? BCC (basal cell carcinoma of skin) 12/10/2002  ? right center upper back  ? BCC (basal cell carcinoma of skin) 05/26/2005  ? right post sholder  ? BCC (basal cell carcinoma) 05/09/2014  ? left inner shin  ? BCC (basal cell carcinoma) 06/12/2014  ? left forearm  ? Bowen's disease 10/07/1994  ? right post knee, right inner forearm/wrist  ? Bowen's disease 08/11/1995  ? left sholder  ? Bronchiectasis ([place])   ? Cataract   ? left  ? Chronic pain   ? Common variable immunodeficiency ([place])   ? sees dr Gayleen Orem 02-11-2021  epic  ? Depression   ? hx of  ? Diverticulosis of colon 1998  ? mild  ? DJD (degenerative joint disease)   ? Duodenal ulcer   ? h/o yrs ago  ? GERD (gastroesophageal reflux disease)   ? History of SCC (squamous cell carcinoma) of skin   ? Dr. Denna Haggard  ? History of sinus bradycardia   ? HLD (hyperlipidemia)   ? hypertriglyceridemia  ? Hypertensive retinopathy of both eyes 11/18/2017  ? Hypothyroidism   ? IBS (irritable bowel syndrome)   ? Internal hemorrhoids 1998  ? Lymphocytic colitis   ? Mitral regurgitation   ? mild  ? Nocardiosis   ? relased by infection disease dec 2022  ? Osteoarthritis   ? feet,shoulder,neck,back,hips and hands.  ? Pneumonia 2015  ? PONV (postoperative nausea and vomiting)   ? Rotator cuff tear, right 02/2019  ? infraspinatus and supraspinatus, and dislocation of  long head of bicep tendon (Dr. Alvan Dame)  ? SCC (squamous cell carcinoma) 11/26/2014  ? left hand, right hand, right deltoid mnay areas  ? SCC (squamous cell carcinoma) 05/03/2017  ? left cheek  ? Sleep apnea   ? uses a mouth guard nightly  ? Squamous cell carcinoma in situ (SCCIS) 07/05/2018  ? left hand  ? Tracheobronchomalacia   ? Vitamin D deficiency   ? mild  ? ? ?Past Surgical History:  ?Procedure Laterality Date  ? ABDOMINAL HYSTERECTOMY  1998  ? complete  ? BLEPHAROPLASTY Bilateral 01/2018  ? BROW LIFT Bilateral 10/10/2018  ? Procedure: BROW LIFT;  Surgeon: Wallace Going, DO;  Location: [place];  Service: Plastics;  Laterality: Bilateral;  ? BUNIONECTOMY    ? R 12/08, L 2004 (Dr. Janus Molder)  ? CARPAL TUNNEL RELEASE  1989  ? bilateral  ? CARPAL TUNNEL RELEASE Left 03/06/2021  ? Procedure: Left Revision Carpal Tunnel Release with hypothenar fat pad flap;  Surgeon: [name], MD;  Location: [place];  Service: Orthopedics;  Laterality: Left;  with local anesthesia  ? CATARACT EXTRACTION Bilateral Left in 08/2012, Right 04/2016  ? Dr.Hecker  ? [place]  ? CHOLECYSTECTOMY  1992  ? laparoscopic  ? CLOSED REDUCTION NASAL FRACTURE N/A 08/29/2018  ? Procedure: CLOSED REDUCTION NASAL FRACTURE;  Surgeon: Wallace Going, DO;  Location: [place];  Service: Plastics;  Laterality: N/A;  1 hour, please  ? COLONOSCOPY  2006  ? COLONOSCOPY  01/2012  ? due again 01/2021; mild diverticulosis  ? CORONARY STENT INTERVENTION N/A 05/19/2021  ? Procedure: CORONARY STENT INTERVENTION;  Surgeon: Nelva Bush, MD;  Location: [place] [facility];  Service: Cardiovascular;  Laterality: N/A;  ? epidural steroid injection, back  02/2010  ? HIP SURGERY    ? right bursectomy x 3  ? HIP SURGERY Right 2000  ? torn cartilage, repaired  ? INGUINAL HERNIA REPAIR  10/2007  ? bilat  ? INTRAVASCULAR ULTRASOUND/IVUS N/A 05/19/2021  ? Procedure: Intravascular  Ultrasound/IVUS;  Surgeon: Nelva Bush, MD;  Location: [place] [facility];  Service: Cardiovascular;  Laterality: N/A;  ? LEFT HEART CATH AND CORONARY ANGIOGRAPHY N/A 05/19/2021  ? Procedure: LEFT HEART CATH AND CORONARY ANGIOGRAPHY;  Surgeon: Nelva Bush, MD;  Location: [place] [facility];  Service: Cardiovascular;  Laterality: N/A;  ? NECK SURGERY  1989  ? c6-7 cervical laminectomy and diskecotmy  ? REVERSE SHOULDER ARTHROPLASTY Right 04/11/2019  ? Procedure: REVERSE SHOULDER ARTHROPLASTY;  Surgeon: Victorino December  Saralyn Pilar, MD;  Location: [place];  Service: Orthopedics;  Laterality: Right;  [place]  ? SHOULDER SURGERY Left 03/2003  ? left rotator cuff repair  ? SHOULDER SURGERY Left 03/14/2018  ? rotator cuff repair; Dr. Tonita Cong  ? TONSILLECTOMY  age 44  ? [place]  ? TUBAL LIGATION  1986  ? WISDOM TOOTH EXTRACTION    ? as teenager  ? ? ?Current Medications: ?Current Meds  ?Medication Sig  ? acetaminophen (TYLENOL) 500 MG tablet Take 1,000 mg by mouth every 6 (six) hours as needed for moderate pain.   ? albuterol (VENTOLIN HFA) 108 (90 Base) MCG/ACT inhaler Inhale 2 puffs into the lungs every 6 (six) hours as needed for wheezing or shortness of breath.  ? aspirin EC 81 MG tablet Take 81 mg by mouth daily.  ? atorvastatin (LIPITOR) 40 MG tablet Take 40 mg by mouth daily.  ? Buprenorphine HCl (BELBUCA) 450 MCG FILM Place 1 film to inside of mouth every twelve hours  ? Calcium Carbonate-Vitamin D 600-200 MG-UNIT TABS Take 1 tablet by mouth 2 (two) times daily.  ? celecoxib (CELEBREX) 200 MG capsule TAKE 1 CAPSULE BY MOUTH 2 TIMES DAILY  ? clopidogrel (PLAVIX) 75 MG tablet Take 1 tablet (75 mg total) by mouth daily with breakfast.  ? colestipol (COLESTID) 1 g tablet Take 1 g by mouth daily.  ? diclofenac Sodium (VOLTAREN) 1 % GEL Apply 2 g topically 4 (four) times daily as needed (joint pain).  ? Docusate Sodium 100 MG capsule Take 100 mg by mouth daily.  ? EPINEPHrine 0.3 mg/0.3  mL IJ SOAJ injection Inject 0.3 mg into the muscle as needed for anaphylaxis.  ? famotidine (PEPCID) 40 MG tablet TAKE 1 TABLET BY MOUTH AT BEDTIME.  ? ferrous sulfate 324 MG TBEC Take 324 mg by mouth da

## 2021-05-23 ENCOUNTER — Telehealth (HOSPITAL_COMMUNITY): Payer: Self-pay

## 2021-05-23 NOTE — Telephone Encounter (Signed)
Called patient to see if she is interested in the Cardiac Rehab Program. Patient expressed interest. Explained scheduling process and went over insurance, patient verbalized understanding. Will contact patient for scheduling once f/u has been completed. 

## 2021-05-23 NOTE — Telephone Encounter (Signed)
Pt insurance is active and benefits verified through [facility]. Co-pay $20.00, DED $0.00/$0.00 met, out of pocket $3,300.00/$1,180.08 met, co-insurance 0%. No pre-authorization required. Passport, 05/23/21 @ 10:02AM, [facility]#[id number] ?  ?Will contact patient to see if she is interested in the Cardiac Rehab Program. If interested, patient will need to complete follow up appt. Once completed, patient will be contacted for scheduling upon review by the RN Navigator. ?

## 2021-05-27 ENCOUNTER — Other Ambulatory Visit (HOSPITAL_COMMUNITY): Payer: Self-pay

## 2021-05-27 ENCOUNTER — Telehealth (HOSPITAL_COMMUNITY): Payer: Self-pay

## 2021-05-27 NOTE — Telephone Encounter (Signed)
Transitions of Care Pharmacy  ° °Call attempted for a pharmacy transitions of care follow-up. HIPAA appropriate voicemail was left with call back information provided.  ° °Call attempt #1. Will follow-up in 2-3 days.  °  °

## 2021-05-28 ENCOUNTER — Inpatient Hospital Stay: Admit: 2021-05-28 | Payer: Medicare PPO | Admitting: Orthopedic Surgery

## 2021-05-28 ENCOUNTER — Telehealth: Payer: Self-pay | Admitting: *Deleted

## 2021-05-28 ENCOUNTER — Other Ambulatory Visit (HOSPITAL_COMMUNITY): Payer: Self-pay

## 2021-05-28 ENCOUNTER — Telehealth (HOSPITAL_COMMUNITY): Payer: Self-pay

## 2021-05-28 SURGERY — OBLIQUE LUMBAR INTERBODY FUSION 2 LEVEL
Anesthesia: General

## 2021-05-28 NOTE — Telephone Encounter (Signed)
Patient contacted regarding discharge from [place] on 05/20/21. ? ?Patient understands to follow up with provider Ermalinda Barrios PA  on 06/03/21 at 12:15 PM  at [facility]. ?Patient understands discharge instructions? YES ?Patient understands medications and regiment? YES ?Patient understands to bring all medications to this visit? YES ? ?Ask patient:  Are you enrolled in My Chart YES   If no ask patient if they would like to enroll.  ? ?

## 2021-05-28 NOTE — Telephone Encounter (Signed)
Pharmacy Transitions of Care Follow-up Telephone Call ? ?Date of discharge: 05/20/21  ?Discharge Diagnosis: stent placement ? ?How have you been since you were released from the hospital? Patient doing well since discharge. Patient has developed a hematoma at wrist that is no longer hard but is still healing. No questions about meds at this time.  ? ?Medication changes made at discharge: ?     START taking: ?clopidogrel (PLAVIX)  ?losartan (Cozaar)  ?nitroGLYCERIN (Nitrostat)  ?pantoprazole (PROTONIX)  ?This replaces a similar medication. See the full medication list for instructions. ?STOP taking: ?esomeprazole 40 MG capsule ([place])  ?Replaced by a similar medication. ? ?Medication changes verified by the patient? Yes ?  ? ?Medication Accessibility: ? ?Home Pharmacy: [facility]  ? ?Was the patient provided with refills on discharged medications? Yes  ? ?Have all prescriptions been transferred from [facility] to home pharmacy? Yes  ? ?Is the patient able to afford medications? Has insurance ?  ? ?Medication Review: ? ?CLOPIDOGREL (PLAVIX) ?Clopidogrel 75 mg once daily.  ?- Advised patient of medications to avoid (NSAIDs, ASA)  ?- Educated that Tylenol (acetaminophen) will be the preferred analgesic to prevent risk of bleeding  ?- Emphasized importance of monitoring for signs and symptoms of bleeding (abnormal bruising, prolonged bleeding, nose bleeds, bleeding from gums, discolored urine, black tarry stools)  ?- Advised patient to alert all providers of anticoagulation therapy prior to starting a new medication or having a procedure  ? ? ?Follow-up Appointments: ? ?[facility] f/u appt confirmed? Scheduled to see Dr. Bonnell Public on 06/03/21 @ 12:15PM.  ? ?If their condition worsens, is the pt aware to call PCP or go to the Emergency Dept.? Yes ? ?Final Patient Assessment: ?Patient has f/u scheduled and refills at home pharmacy ? ?

## 2021-05-29 ENCOUNTER — Inpatient Hospital Stay: Admit: 2021-05-29 | Payer: Medicare PPO | Admitting: Orthopedic Surgery

## 2021-05-29 SURGERY — POSTERIOR LUMBAR FUSION 2 LEVEL
Anesthesia: General

## 2021-06-03 ENCOUNTER — Ambulatory Visit: Payer: Medicare PPO | Admitting: Physician Assistant

## 2021-06-03 ENCOUNTER — Encounter: Payer: Self-pay | Admitting: Physician Assistant

## 2021-06-03 VITALS — BP 100/60 | HR 66 | Ht 64.0 in | Wt 157.0 lb

## 2021-06-03 DIAGNOSIS — J849 Interstitial pulmonary disease, unspecified: Secondary | ICD-10-CM

## 2021-06-03 DIAGNOSIS — G4733 Obstructive sleep apnea (adult) (pediatric): Secondary | ICD-10-CM

## 2021-06-03 DIAGNOSIS — M549 Dorsalgia, unspecified: Secondary | ICD-10-CM | POA: Diagnosis not present

## 2021-06-03 DIAGNOSIS — I2510 Atherosclerotic heart disease of native coronary artery without angina pectoris: Secondary | ICD-10-CM | POA: Diagnosis not present

## 2021-06-03 DIAGNOSIS — I471 Supraventricular tachycardia: Secondary | ICD-10-CM

## 2021-06-03 DIAGNOSIS — E785 Hyperlipidemia, unspecified: Secondary | ICD-10-CM | POA: Diagnosis not present

## 2021-06-03 DIAGNOSIS — G8929 Other chronic pain: Secondary | ICD-10-CM

## 2021-06-03 LAB — CBC
Hematocrit: 35.3 % (ref 34.0–46.6)
Hemoglobin: 11.9 g/dL (ref 11.1–15.9)
MCH: 32.4 pg (ref 26.6–33.0)
MCHC: 33.7 g/dL (ref 31.5–35.7)
MCV: 96 fL (ref 79–97)
Platelets: 150 10*3/uL (ref 150–450)
RBC: 3.67 x10E6/uL — ABNORMAL LOW (ref 3.77–5.28)
RDW: 14.2 % (ref 11.7–15.4)
WBC: 4.8 10*3/uL (ref 3.4–10.8)

## 2021-06-03 LAB — BASIC METABOLIC PANEL
BUN/Creatinine Ratio: 17 (ref 12–28)
BUN: 11 mg/dL (ref 8–27)
CO2: 25 mmol/L (ref 20–29)
Calcium: 9.1 mg/dL (ref 8.7–10.3)
Chloride: 106 mmol/L (ref 96–106)
Creatinine, Ser: 0.65 mg/dL (ref 0.57–1.00)
Glucose: 93 mg/dL (ref 70–99)
Potassium: 4.4 mmol/L (ref 3.5–5.2)
Sodium: 142 mmol/L (ref 134–144)
eGFR: 93 mL/min/{1.73_m2} (ref >59)

## 2021-06-03 NOTE — Patient Instructions (Signed)
Medication Instructions:  ?Your physician recommends that you continue on your current medications as directed. Please refer to the Current Medication list given to you today. ? ?*If you need a refill on your cardiac medications before your next appointment, please call your pharmacy* ? ? ?Lab Work: ?TODAY: BMET, CBC ? ?If you have labs (blood work) drawn today and your tests are completely normal, you will receive your results only by: ?MyChart Message (if you have MyChart) OR ?A paper copy in the mail ?If you have any lab test that is abnormal or we need to change your treatment, we will call you to review the results. ? ? ?Follow-Up: ?At [facility], you and your health needs are our priority.  As part of our continuing mission to provide you with exceptional heart care, we have created designated Provider Care Teams.  These Care Teams include your primary Cardiologist (physician) and Advanced Practice Providers (APPs -  Physician Assistants and Nurse Practitioners) who all work together to provide you with the care you need, when you need it. ? ?We recommend signing up for the patient portal called "MyChart".  Sign up information is provided on this After Visit Summary.  MyChart is used to connect with patients for Virtual Visits (Telemedicine).  Patients are able to view lab/test results, encounter notes, upcoming appointments, etc.  Non-urgent messages can be sent to your provider as well.   ?To learn more about what you can do with MyChart, go to [email or web address].   ? ?Your next appointment:   ?As scheduled ? ?Important Information About Sugar ? ? ? ? ?  ?

## 2021-06-10 ENCOUNTER — Encounter: Payer: Self-pay | Admitting: Dermatology

## 2021-06-10 ENCOUNTER — Ambulatory Visit: Payer: Medicare PPO | Admitting: Dermatology

## 2021-06-10 DIAGNOSIS — Z85828 Personal history of other malignant neoplasm of skin: Secondary | ICD-10-CM

## 2021-06-10 DIAGNOSIS — L570 Actinic keratosis: Secondary | ICD-10-CM | POA: Diagnosis not present

## 2021-06-11 ENCOUNTER — Other Ambulatory Visit (HOSPITAL_COMMUNITY): Payer: Self-pay

## 2021-06-12 ENCOUNTER — Other Ambulatory Visit (HOSPITAL_COMMUNITY): Payer: Self-pay

## 2021-06-12 MED ORDER — PAROXETINE HCL 30 MG PO TABS
ORAL_TABLET | ORAL | 1 refills | Status: DC
  Filled 2021-06-12: qty 90, 90d supply, fill #0
  Filled 2021-08-24: qty 90, 90d supply, fill #1

## 2021-06-16 ENCOUNTER — Other Ambulatory Visit (HOSPITAL_COMMUNITY): Payer: Self-pay

## 2021-06-16 ENCOUNTER — Other Ambulatory Visit: Payer: Self-pay

## 2021-06-16 MED ORDER — TICAGRELOR 90 MG PO TABS
90.0000 mg | ORAL_TABLET | Freq: Two times a day (BID) | ORAL | 3 refills | Status: DC
  Filled 2021-06-16: qty 180, 90d supply, fill #0
  Filled 2021-08-24: qty 180, 90d supply, fill #1

## 2021-06-17 ENCOUNTER — Other Ambulatory Visit (HOSPITAL_COMMUNITY): Payer: Self-pay

## 2021-06-18 DIAGNOSIS — J479 Bronchiectasis, uncomplicated: Secondary | ICD-10-CM | POA: Diagnosis not present

## 2021-06-20 ENCOUNTER — Other Ambulatory Visit (HOSPITAL_COMMUNITY): Payer: Self-pay

## 2021-06-20 DIAGNOSIS — Z79891 Long term (current) use of opiate analgesic: Secondary | ICD-10-CM | POA: Diagnosis not present

## 2021-06-20 DIAGNOSIS — G894 Chronic pain syndrome: Secondary | ICD-10-CM | POA: Diagnosis not present

## 2021-06-20 DIAGNOSIS — M47816 Spondylosis without myelopathy or radiculopathy, lumbar region: Secondary | ICD-10-CM | POA: Diagnosis not present

## 2021-06-20 DIAGNOSIS — M150 Primary generalized (osteo)arthritis: Secondary | ICD-10-CM | POA: Diagnosis not present

## 2021-06-20 MED ORDER — METHOCARBAMOL 500 MG PO TABS
ORAL_TABLET | ORAL | 2 refills | Status: DC
  Filled 2021-06-20: qty 270, 60d supply, fill #0
  Filled 2021-08-24: qty 270, 60d supply, fill #1
  Filled 2021-12-14 – 2022-01-06 (×2): qty 270, 90d supply, fill #2

## 2021-06-20 MED ORDER — HYDROCODONE-ACETAMINOPHEN 7.5-325 MG PO TABS
ORAL_TABLET | ORAL | 0 refills | Status: DC
  Filled 2021-06-20: qty 100, 25d supply, fill #0

## 2021-06-20 MED ORDER — BELBUCA 750 MCG BU FILM
ORAL_FILM | BUCCAL | 2 refills | Status: DC
  Filled 2021-06-20: qty 60, 30d supply, fill #0
  Filled 2021-07-19: qty 60, 30d supply, fill #1
  Filled 2021-08-20: qty 60, 30d supply, fill #2

## 2021-06-23 ENCOUNTER — Other Ambulatory Visit (HOSPITAL_COMMUNITY): Payer: Self-pay

## 2021-06-25 ENCOUNTER — Telehealth: Payer: Self-pay

## 2021-06-25 NOTE — Telephone Encounter (Signed)
Pt states she is feeling much better today. She will f/u with Pulmonologist this Friday and let us know how that appt goes.  ?

## 2021-06-26 DIAGNOSIS — M25561 Pain in right knee: Secondary | ICD-10-CM | POA: Diagnosis not present

## 2021-06-27 ENCOUNTER — Ambulatory Visit: Payer: Medicare PPO | Admitting: Internal Medicine

## 2021-06-27 ENCOUNTER — Encounter: Payer: Self-pay | Admitting: Internal Medicine

## 2021-06-27 VITALS — BP 122/70 | HR 88 | Temp 97.5°F | Ht 64.0 in | Wt 150.2 lb

## 2021-06-27 DIAGNOSIS — J300 Vasomotor rhinitis: Secondary | ICD-10-CM

## 2021-06-27 DIAGNOSIS — G4733 Obstructive sleep apnea (adult) (pediatric): Secondary | ICD-10-CM

## 2021-06-27 DIAGNOSIS — J479 Bronchiectasis, uncomplicated: Secondary | ICD-10-CM

## 2021-06-27 DIAGNOSIS — D801 Nonfamilial hypogammaglobulinemia: Secondary | ICD-10-CM | POA: Diagnosis not present

## 2021-06-27 NOTE — Progress Notes (Signed)
? ? ? ?      ?Beverly [name]    [id number]    06-Sep-1945 ? ?Primary Care Physician:Hernandez Everardo Beals, MD ?Date of Appointment: 06/27/2021 ?Established Patient Visit ? ?Chief complaint:   ?Chief Complaint  ?Patient presents with  ? Follow-up  ?  No concerns.  Only 1 good cough a day.  Doing much better.  Gets sob.  ? ? ? ?HPI: ?Beverly [name] is a 76 y.o. woman with bronchiectasis a hypogammaglobulinemia with chronic rhinitis. On IVIG infusions for hypogammaglobulinemia. Has nocardia pulmonary infection and is on 12 months therapy with bactrim which finished Dec 2022.  ? ?Interval Updates: ?Here for follow up after starting ipratropium nasal spray.  Works well for her - better than any other nasal spray.  ? ?She had a CT coronary calcium scoring scan which showed coronary calcifications and she ended up having LHC and two DES placed.  On Brillinta for the next 6 months. She is waiting to start cardiac rehab.  ? ?Was supposed to have back surgery but this is on hold after her LHC.   ? ?For her bronchiectasis she is doing well off airway clearance and off bactrim.  ?Hasn't had to use an inhaler in a long while.  ? ?For her lymphocytic colitis she is now off steroids and diarrhea is improved on cholestipol.  ? ?Overall things are going well.  ? ?I have reviewed the patient's past medical, family, and social history and made changes as appropriate.  ? ? ?Past Medical History:  ?Diagnosis Date  ? Allergy   ? Amaurosis fugax   ? negative w/u through [facility] right eye  ? Asthma   ? no attacks in several yrs per pt on 03-03-2021  ? Back pain   ? Dr Joline Salt 02/2010-epidural injection x 2 at L4-5 with good effect  ? 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  ? right sholder  ? BCC (basal cell carcinoma of skin) 05/09/2014  ? mid lower back  ? BCC (basal cell carcinoma of skin) 05/03/2017  ? right low back  ? BCC (basal cell carcinoma of skin) 05/03/2017  ? left upper back  ? BCC (basal cell carcinoma of skin) 07/05/2018   ? left mid back  ? BCC (basal cell carcinoma of skin) 05/20/1992  ? upper back  ? BCC (basal cell carcinoma of skin) 07/29/1993  ? left sholder medial  ? BCC (basal cell carcinoma of skin) 07/29/1993  ? left sholder lateral  ? BCC (basal cell carcinoma of skin) 07/29/1993  ? right thigh  ? BCC (basal cell carcinoma of skin) 07/29/1993  ? right sholder  ? BCC (basal cell carcinoma of skin) 12/22/1994  ? right mid forearm  ? BCC (basal cell carcinoma of skin) 12/22/1994  ? right upper forearm  ? BCC (basal cell carcinoma of skin) 12/22/1994  ? lower right upper arm  ? BCC (basal cell carcinoma of skin) 12/22/1994  ? right upper arm sholder  ? BCC (basal cell carcinoma of skin) 08/11/1995  ? left leg below knee  ? BCC (basal cell carcinoma of skin) 04/11/2002  ? mid back  ? BCC (basal cell carcinoma of skin) 12/10/2002  ? right center upper back  ? BCC (basal cell carcinoma of skin) 05/26/2005  ? right post sholder  ? BCC (basal cell carcinoma) 05/09/2014  ? left inner shin  ? BCC (basal cell carcinoma) 06/12/2014  ? left forearm  ? Bowen's disease 10/07/1994  ? right post knee, right inner  forearm/wrist  ? Bowen's disease 08/11/1995  ? left sholder  ? Bronchiectasis ([place])   ? Cataract   ? left  ? Chronic pain   ? Common variable immunodeficiency ([place])   ? sees dr Gayleen Orem 02-11-2021 epic  ? Depression   ? hx of  ? Diverticulosis of colon 1998  ? mild  ? DJD (degenerative joint disease)   ? Duodenal ulcer   ? h/o yrs ago  ? GERD (gastroesophageal reflux disease)   ? History of SCC (squamous cell carcinoma) of skin   ? Dr. Denna Haggard  ? History of sinus bradycardia   ? HLD (hyperlipidemia)   ? hypertriglyceridemia  ? Hypertensive retinopathy of both eyes 11/18/2017  ? Hypothyroidism   ? IBS (irritable bowel syndrome)   ? Internal hemorrhoids 1998  ? Lymphocytic colitis   ? Mitral regurgitation   ? mild  ? Nocardiosis   ? relased by infection disease dec 2022  ? Osteoarthritis   ? feet,shoulder,neck,back,hips and hands.   ? Pneumonia 2015  ? PONV (postoperative nausea and vomiting)   ? Rotator cuff tear, right 02/2019  ? infraspinatus and supraspinatus, and dislocation of long head of bicep tendon (Dr. Alvan Dame)  ? SCC (squamous cell carcinoma) 11/26/2014  ? left hand, right hand, right deltoid mnay areas  ? SCC (squamous cell carcinoma) 05/03/2017  ? left cheek  ? Sleep apnea   ? uses a mouth guard nightly  ? Squamous cell carcinoma in situ (SCCIS) 07/05/2018  ? left hand  ? Tracheobronchomalacia   ? Vitamin D deficiency   ? mild  ? ? ?Past Surgical History:  ?Procedure Laterality Date  ? ABDOMINAL HYSTERECTOMY  1998  ? complete  ? BLEPHAROPLASTY Bilateral 01/2018  ? BROW LIFT Bilateral 10/10/2018  ? Procedure: BROW LIFT;  Surgeon: Wallace Going, DO;  Location: [place];  Service: Plastics;  Laterality: Bilateral;  ? BUNIONECTOMY    ? R 12/08, L 2004 (Dr. Janus Molder)  ? CARPAL TUNNEL RELEASE  1989  ? bilateral  ? CARPAL TUNNEL RELEASE Left 03/06/2021  ? Procedure: Left Revision Carpal Tunnel Release with hypothenar fat pad flap;  Surgeon: [name], MD;  Location: [place];  Service: Orthopedics;  Laterality: Left;  with local anesthesia  ? CATARACT EXTRACTION Bilateral Left in 08/2012, Right 04/2016  ? Dr.Hecker  ? [place]  ? CHOLECYSTECTOMY  1992  ? laparoscopic  ? CLOSED REDUCTION NASAL FRACTURE N/A 08/29/2018  ? Procedure: CLOSED REDUCTION NASAL FRACTURE;  Surgeon: Wallace Going, DO;  Location: [place];  Service: Plastics;  Laterality: N/A;  1 hour, please  ? COLONOSCOPY  2006  ? COLONOSCOPY  01/2012  ? due again 01/2021; mild diverticulosis  ? CORONARY STENT INTERVENTION N/A 05/19/2021  ? Procedure: CORONARY STENT INTERVENTION;  Surgeon: Nelva Bush, MD;  Location: [place] [facility];  Service: Cardiovascular;  Laterality: N/A;  ? epidural steroid injection, back  02/2010  ? HIP SURGERY    ? right bursectomy x 3  ? HIP SURGERY Right  2000  ? torn cartilage, repaired  ? INGUINAL HERNIA REPAIR  10/2007  ? bilat  ? INTRAVASCULAR ULTRASOUND/IVUS N/A 05/19/2021  ? Procedure: Intravascular Ultrasound/IVUS;  Surgeon: Nelva Bush, MD;  Location: [place] [facility];  Service: Cardiovascular;  Laterality: N/A;  ? LEFT HEART CATH AND CORONARY ANGIOGRAPHY N/A 05/19/2021  ? Procedure: LEFT HEART CATH AND CORONARY ANGIOGRAPHY;  Surgeon: Nelva Bush, MD;  Location: [place] [facility];  Service: Cardiovascular;  Laterality: N/A;  ? NECK SURGERY  1989  ? c6-7 cervical laminectomy and diskecotmy  ? REVERSE SHOULDER ARTHROPLASTY Right 04/11/2019  ? Procedure: REVERSE SHOULDER ARTHROPLASTY;  Surgeon: Nicholes Stairs, MD;  Location: [place];  Service: Orthopedics;  Laterality: Right;  [place]  ? SHOULDER SURGERY Left 03/2003  ? left rotator cuff repair  ? SHOULDER SURGERY Left 03/14/2018  ? rotator cuff repair; Dr. Tonita Cong  ? TONSILLECTOMY  age 12  ? [place]  ? TUBAL LIGATION  1986  ? WISDOM TOOTH EXTRACTION    ? as teenager  ? ? ?Family History  ?Problem Relation Age of Onset  ? Depression Mother   ? Schizophrenia Mother   ? Cancer Father   ?     oral  ? Heart disease Father   ?     bradycardia  ? Esophageal cancer Father   ? Cancer Sister   ?     skin and lung  ? COPD Sister   ? Hypertension Sister   ? Osteoporosis Sister   ?     compression fx's x 3 11/2018  ? Hyperthyroidism Brother   ? Cancer Brother   ?     metastatic cancer to bone (?primary)  ? Diabetes Maternal Grandmother   ? Cancer Paternal Grandmother 28  ?     colon cancer, metastatic to liver  ? Colon cancer Paternal Grandmother   ? Liver cancer Paternal Grandmother   ? Hyperlipidemia Daughter   ? Asthma Son   ? ADD / ADHD Other   ? ? ?Social History  ? ?Occupational History  ? Occupation: STAFF NURSE  ?  Employer: [employer]  ?Tobacco Use  ? Smoking status: Never  ? Smokeless tobacco: Never  ?Vaping Use  ? Vaping Use: Never used  ?Substance  and Sexual Activity  ? Alcohol use: Yes  ?  Alcohol/week: 7.0 standard drinks  ?  Types: 7 Glasses of wine per week  ?  Comment: 1 glass wine per day  ? Drug use: No  ? Sexual activity: Not Currently

## 2021-06-27 NOTE — Patient Instructions (Signed)
Please schedule follow up scheduled with myself in 1 year.  If my schedule is not open yet, we will contact you with a reminder closer to that time. Please call [phone] if you haven't heard from [place] a month before.  ? ? ?

## 2021-06-28 ENCOUNTER — Encounter: Payer: Self-pay | Admitting: Dermatology

## 2021-06-28 NOTE — Progress Notes (Signed)
? ?  Follow-Up Visit ?  ?Subjective  ?Beverly [name] is a 76 y.o. female who presents for the following: Follow-up (Patient here today for 3 month follow up. Per patient she has a lesion on her right forearm that she would like rechecked. Patient states that she has non healing lesion on her left hand. Patient has lesions on her left lower leg that she would like checked. ). ? ?Check sites of skin cancer, several new nonhealing crusts ?Location:  ?Duration:  ?Quality:  ?Associated Signs/Symptoms: ?Modifying Factors:  ?Severity:  ?Timing: ?Context:  ? ?Objective  ?Well appearing patient in no apparent distress; mood and affect are within normal limits. ?Left Dorsal Hand (4) ?Multiple areas with gritty ? ?Left Upper Back, Right Hand - Posterior ?No sign residual skin cancer. ? ? ? ?All sun exposed areas plus back examined. ? ? ?Assessment & Plan  ? ? ?AK (actinic keratosis) (4) ?Left Dorsal Hand ? ?Destruction of lesion - Left Dorsal Hand ?Complexity: simple   ?Destruction method: cryotherapy   ?Informed consent: discussed and consent obtained   ?Timeout:  patient name, date of birth, surgical site, and procedure verified ?Lesion destroyed using liquid nitrogen: Yes   ?Cryotherapy cycles:  5 ?Outcome: patient tolerated procedure well with no complications   ?Post-procedure details: wound care instructions given   ? ?Related Medications ?Fluorouracil (TOLAK) 4 % CREA ?Apply to aa total 28 applications & sun protect ? ?Personal history of skin cancer (2) ?Right Hand - Posterior; Left Upper Back ? ?Check as needed change ? ? ? ? ? ?I, Lavonna Monarch, MD, have reviewed all documentation for this visit.  The documentation on 06/28/21 for the exam, diagnosis, procedures, and orders are all accurate and complete. ?

## 2021-07-09 ENCOUNTER — Encounter: Payer: Self-pay | Admitting: Allergy and Immunology

## 2021-07-15 ENCOUNTER — Other Ambulatory Visit (HOSPITAL_COMMUNITY): Payer: Self-pay

## 2021-07-15 MED ORDER — COLESTIPOL HCL 1 G PO TABS
1.0000 g | ORAL_TABLET | Freq: Every day | ORAL | 2 refills | Status: DC
  Filled 2021-07-15: qty 30, 30d supply, fill #0
  Filled 2021-08-20: qty 30, 30d supply, fill #1

## 2021-07-19 DIAGNOSIS — J479 Bronchiectasis, uncomplicated: Secondary | ICD-10-CM | POA: Diagnosis not present

## 2021-07-21 ENCOUNTER — Other Ambulatory Visit (HOSPITAL_COMMUNITY): Payer: Self-pay

## 2021-07-22 ENCOUNTER — Telehealth (HOSPITAL_COMMUNITY): Payer: Self-pay

## 2021-07-22 ENCOUNTER — Other Ambulatory Visit (HOSPITAL_COMMUNITY): Payer: Self-pay

## 2021-07-22 NOTE — Telephone Encounter (Signed)
Pt insurance is active and benefits verified through [place] $20, DED 0/0 met, out of pocket $3,300/$1,959.58 met, co-insurance 0%. no pre-authorization required. Passport, 07/22/2021@10 :55am, REF# [id number]   How many CR sessions are covered? (36 sessions for TCR, 72 sessions for ICR)72 Is this a lifetime maximum or an annual maximum? annual Has the member used any of these services to date? no Is there a time limit (weeks/months) on start of program and/or program completion? no

## 2021-07-23 ENCOUNTER — Other Ambulatory Visit: Payer: Self-pay | Admitting: Internal Medicine

## 2021-07-23 ENCOUNTER — Encounter (HOSPITAL_COMMUNITY): Payer: Self-pay

## 2021-07-23 ENCOUNTER — Telehealth (HOSPITAL_COMMUNITY): Payer: Self-pay

## 2021-07-23 ENCOUNTER — Other Ambulatory Visit (HOSPITAL_COMMUNITY): Payer: Self-pay

## 2021-07-23 DIAGNOSIS — E782 Mixed hyperlipidemia: Secondary | ICD-10-CM

## 2021-07-23 MED ORDER — ATORVASTATIN CALCIUM 40 MG PO TABS
ORAL_TABLET | Freq: Every day | ORAL | 1 refills | Status: DC
  Filled 2021-07-23: qty 90, 90d supply, fill #0
  Filled 2021-11-15: qty 90, 90d supply, fill #1

## 2021-07-23 MED ORDER — OMEGA-3-ACID ETHYL ESTERS 1 G PO CAPS
ORAL_CAPSULE | ORAL | 0 refills | Status: DC
  Filled 2021-07-23: qty 360, 90d supply, fill #0

## 2021-07-23 NOTE — Telephone Encounter (Signed)
Attempted to call patient in regards to Cardiac Rehab - LM on VM Mailed letter 

## 2021-07-24 ENCOUNTER — Other Ambulatory Visit (HOSPITAL_COMMUNITY): Payer: Self-pay

## 2021-08-01 ENCOUNTER — Encounter: Payer: Self-pay | Admitting: Plastic Surgery

## 2021-08-01 ENCOUNTER — Other Ambulatory Visit (HOSPITAL_COMMUNITY): Payer: Self-pay

## 2021-08-01 ENCOUNTER — Ambulatory Visit: Payer: Medicare PPO | Admitting: Plastic Surgery

## 2021-08-01 DIAGNOSIS — L988 Other specified disorders of the skin and subcutaneous tissue: Secondary | ICD-10-CM

## 2021-08-01 DIAGNOSIS — L989 Disorder of the skin and subcutaneous tissue, unspecified: Secondary | ICD-10-CM

## 2021-08-01 NOTE — Progress Notes (Signed)
   Subjective:    Patient ID: Beverly [name], female    DOB: 06-16-1945, 76 y.o.   MRN: [id number]  The patient is a 76 year old female here for evaluation of her left ear.  She noticed that for the past year she has had a tender area on her helix on the midportion.  She notices that it is very painful if she lays on it and it seems to be getting worse.  The area is about 3 mm in size.  It is red and its on the helix of the left ear at the superior midportion.  Nothing seems to make it better and the patient states it is getting worse with time.  She does have a history of skin cancer.      Review of Systems  Constitutional: Negative.   HENT:  Positive for ear pain.   Eyes: Negative.   Respiratory: Negative.    Cardiovascular: Negative.   Gastrointestinal: Negative.   Endocrine: Negative.   Genitourinary: Negative.   Musculoskeletal: Negative.   Skin:  Positive for wound.       Objective:   Physical Exam Constitutional:      Appearance: Normal appearance.  HENT:     Head: Atraumatic.     Right Ear: External ear normal.     Ears:   Eyes:     Pupils: Pupils are equal, round, and reactive to light.  Cardiovascular:     Rate and Rhythm: Normal rate.     Pulses: Normal pulses.  Skin:    Capillary Refill: Capillary refill takes less than 2 seconds.  Neurological:     Mental Status: She is alert and oriented to person, place, and time.  Psychiatric:        Mood and Affect: Mood normal.        Behavior: Behavior normal.        Thought Content: Thought content normal.        Judgment: Judgment normal.        Assessment & Plan:     ICD-10-CM   1. Changing skin lesion  L98.9        Recommend excision of changing skin lesion of left ear. Patient has an appointment with Dr. Johnsie Cancel next week regarding her Brilinta. she will ask if she is able to stop it prior to the procedure and if she needs to be bridged with Lovenox.  Pictures were obtained of the patient and  placed in the chart with the patient's or guardian's permission.

## 2021-08-02 ENCOUNTER — Encounter: Payer: Self-pay | Admitting: Gastroenterology

## 2021-08-04 ENCOUNTER — Other Ambulatory Visit: Payer: Self-pay

## 2021-08-04 DIAGNOSIS — R197 Diarrhea, unspecified: Secondary | ICD-10-CM

## 2021-08-04 NOTE — Addendum Note (Signed)
Addended by: Harl Bowie on: 08/04/2021 02:56 PM   Modules accepted: Orders

## 2021-08-04 NOTE — Telephone Encounter (Signed)
Called pt and gave pt recommendations. Orders placed for stool tests. For the budesonide do I just send in the 9 mg/day x4 weeks and 6 mg/day x 8 weeks and then does she call to get 3 mg/ day or how long is she supposed to stay on the 3 mg?

## 2021-08-04 NOTE — Telephone Encounter (Signed)
Chart reviewed.  Looks like symptoms had been well controlled on colestipol and started to breakthrough when decreasing colestipol to 3 times/week, and unfortunately unable to regain control despite going back to 2 tablets/day and adding Imodium.  Plan for the following:  - Check GI PCR panel and C. difficile to ensure no coinfection along with fecal calprotectin - Given lack of response and known history of Microscopic Colitis, plan to restart budesonide 9 mg/day x4 weeks then 6 mg/day x8 weeks.  If symptoms well controlled, will continue weaning at 3 mg/day. - Can continue using Imodium - Can continue colestipol for the time being and hopefully budesonide kicks in and colestipol can again be used as monotherapy in the future

## 2021-08-04 NOTE — Telephone Encounter (Signed)
Can do the 3 mg/day 8 weeks for a long taper. Thanks.

## 2021-08-05 ENCOUNTER — Encounter (HOSPITAL_COMMUNITY)
Admission: RE | Admit: 2021-08-05 | Discharge: 2021-08-05 | Disposition: A | Discharge status: Home / Self Care | Payer: Medicare PPO | Source: Ambulatory Visit | Attending: Cardiovascular Disease | Admitting: Cardiovascular Disease

## 2021-08-05 ENCOUNTER — Other Ambulatory Visit (HOSPITAL_COMMUNITY): Payer: Self-pay

## 2021-08-05 ENCOUNTER — Other Ambulatory Visit: Payer: Self-pay

## 2021-08-05 ENCOUNTER — Encounter (HOSPITAL_COMMUNITY): Payer: Self-pay

## 2021-08-05 ENCOUNTER — Other Ambulatory Visit (INDEPENDENT_AMBULATORY_CARE_PROVIDER_SITE_OTHER): Payer: Medicare PPO

## 2021-08-05 VITALS — BP 114/60 | HR 61 | Ht 65.0 in | Wt 147.0 lb

## 2021-08-05 DIAGNOSIS — E559 Vitamin D deficiency, unspecified: Secondary | ICD-10-CM

## 2021-08-05 DIAGNOSIS — Z955 Presence of coronary angioplasty implant and graft: Secondary | ICD-10-CM | POA: Diagnosis not present

## 2021-08-05 HISTORY — DX: Atherosclerotic heart disease of native coronary artery without angina pectoris: I25.10

## 2021-08-05 LAB — VITAMIN D 25 HYDROXY (VIT D DEFICIENCY, FRACTURES): VITD: 39.28 ng/mL (ref 30.00–100.00)

## 2021-08-05 MED ORDER — BUDESONIDE 3 MG PO CPEP
ORAL_CAPSULE | ORAL | 0 refills | Status: DC
  Filled 2021-08-05: qty 183, 77d supply, fill #0
  Filled 2021-08-12: qty 13, 7d supply, fill #0
  Filled 2021-10-15: qty 196, 56d supply, fill #1
  Filled 2021-10-23: qty 56, 56d supply, fill #1

## 2021-08-05 NOTE — Telephone Encounter (Signed)
Prescription sent to pt's pharmacy. Called pt to let her know.

## 2021-08-05 NOTE — Progress Notes (Signed)
Cardiac Rehab Medication Review by a Nurse  Does the patient  feel that his/her medications are working for him/her?  yes  Has the patient been experiencing any side effects to the medications prescribed?  no  Does the patient measure his/her own blood pressure or blood glucose at home?  no   Does the patient have any problems obtaining medications due to transportation or finances?   no  Understanding of regimen: excellent Understanding of indications: excellent Potential of compliance: excellent    Nurse comments: Estefanny is taking her medications as prescribed. Amen has a BP cuff at home and does not check her BP's on a daily basis.    Christa See Amazin Pincock RN 08/05/2021 12:20 PM

## 2021-08-05 NOTE — Progress Notes (Addendum)
Cardiac Individual Treatment Plan  Patient Details  Name: Beverly [name] MRN: [id number] Date of Birth: 03/20/1945 Referring Provider:   Flowsheet Row INTENSIVE CARDIAC REHAB ORIENT from 08/05/2021 in [place]  Referring Provider [name], MD       Initial Encounter Date:  [facility] from 08/05/2021 in [place]  Date 08/05/21       Visit Diagnosis: 05/19/21 S/P DES x 2 LAD, Ramus  Patient's Home Medications on Admission:  Current Outpatient Medications:    acetaminophen (TYLENOL) 500 MG tablet, Take 1,000 mg by mouth every 6 (six) hours as needed for moderate pain. , Disp: , Rfl:    albuterol (VENTOLIN HFA) 108 (90 Base) MCG/ACT inhaler, Inhale 2 puffs into the lungs every 6 (six) hours as needed for wheezing or shortness of breath., Disp: 18 g, Rfl: 1   aspirin EC 81 MG tablet, Take 81 mg by mouth daily., Disp: , Rfl:    atorvastatin (LIPITOR) 40 MG tablet, Take 1 tablet by mouth daily., Disp: 90 tablet, Rfl: 1   budesonide (ENTOCORT EC) 3 MG 24 hr capsule, Take 3 capsules (9 mg total) by mouth daily for 28 days, THEN 2 capsules (6 mg total) daily for 56 days, THEN 1 capsule (3 mg total) daily., Disp: 252 capsule, Rfl: 0   Buprenorphine HCl (BELBUCA) 750 MCG FILM, Place 1 film to inside of mouth every twelve hours, Disp: 60 each, Rfl: 2   Calcium Carbonate-Vitamin D 600-200 MG-UNIT TABS, Take 1 tablet by mouth 2 (two) times daily., Disp: , Rfl:    celecoxib (CELEBREX) 200 MG capsule, TAKE 1 CAPSULE BY MOUTH 2 TIMES DAILY, Disp: 180 capsule, Rfl: 1   colestipol (COLESTID) 1 g tablet, Take 1 tablet by mouth daily., Disp: 30 tablet, Rfl: 2   diclofenac Sodium (VOLTAREN) 1 % GEL, Apply 2 g topically 4 (four) times daily as needed (joint pain)., Disp: , Rfl:    Docusate Sodium 100 MG capsule, Take 100 mg by mouth daily., Disp: , Rfl:    EPINEPHrine 0.3 mg/0.3 mL IJ SOAJ injection,  Inject 0.3 mg into the muscle as needed for anaphylaxis., Disp: , Rfl:    famotidine (PEPCID) 40 MG tablet, TAKE 1 TABLET BY MOUTH AT BEDTIME., Disp: 90 tablet, Rfl: 2   ferrous sulfate 324 MG TBEC, Take 324 mg by mouth daily with breakfast., Disp: , Rfl:    Fluorouracil (TOLAK) 4 % CREA, Apply to aa total 28 applications & sun protect, Disp: 40 g, Rfl: 0   gabapentin (NEURONTIN) 800 MG tablet, Take 1 tablet by mouth at bedtime., Disp: 90 tablet, Rfl: 2   guaiFENesin (MUCINEX) 600 MG 12 hr tablet, Take 600 mg by mouth 2 (two) times daily as needed for cough or to loosen phlegm., Disp: , Rfl:    HYDROcodone-acetaminophen (NORCO) 7.5-325 MG tablet, Take 1 tablet by mouth four times a day as needed for pain, Disp: 100 tablet, Rfl: 0   ipratropium (ATROVENT) 0.03 % nasal spray, Place 1 spray into both nostrils 3 times daily., Disp: 30 mL, Rfl: 12   Lactobacillus-Inulin (CULTURELLE ADULT ULT BALANCE) CAPS, Take 1 capsule by mouth daily., Disp: , Rfl:    levothyroxine (SYNTHROID) 25 MCG tablet, Take 1 tablet (25 mcg total) by mouth daily., Disp: 90 tablet, Rfl: 1   losartan (COZAAR) 25 MG tablet, Take 0.5 tablets (12.5 mg total) by mouth daily., Disp: 90 tablet, Rfl: 0   Melatonin-Pyridoxine (  MELATIN PO), Take 10 mg by mouth at bedtime., Disp: , Rfl:    methocarbamol (ROBAXIN) 500 MG tablet, Take 1 tablet by mouth three times a day as needed, Disp: 270 tablet, Rfl: 2   Misc Natural Products (FIBER 7 PO), Take 2 capsules by mouth daily., Disp: , Rfl:    montelukast (SINGULAIR) 10 MG tablet, TAKE 1 TABLET BY MOUTH AT BEDTIME., Disp: 90 tablet, Rfl: 2   Multiple Vitamins-Minerals (MULTIVITAMIN WITH MINERALS) tablet, Take 1 tablet by mouth daily., Disp: , Rfl:    nitroGLYCERIN (NITROSTAT) 0.4 MG SL tablet, Place 1 tablet (0.4 mg total) under the tongue every 5 (five) minutes as needed., Disp: 25 tablet, Rfl: 2   omega-3 acid ethyl esters (LOVAZA) 1 g capsule, TAKE 2 CAPSULES BY MOUTH 2 TIMES DAILY, Disp: 360  capsule, Rfl: 0   ondansetron (ZOFRAN-ODT) 4 MG disintegrating tablet, Take 1 tablet by mouth every 6 (six) hours as needed for nausea or vomiting., Disp: 30 tablet, Rfl: 1   pantoprazole (PROTONIX) 40 MG tablet, Take 1 tablet (40 mg total) by mouth daily with breakfast., Disp: 90 tablet, Rfl: 0   PARoxetine (PAXIL) 30 MG tablet, Take 1 tablet by mouth once a day., Disp: 90 tablet, Rfl: 1   ticagrelor (BRILINTA) 90 MG TABS tablet, Take 1 tablet by mouth 2 times daily. 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   right sholder   BCC (basal cell carcinoma of skin) 05/09/2014   mid lower back   BCC (basal cell carcinoma of skin) 05/03/2017   right low back   BCC (basal cell carcinoma of skin) 05/03/2017   left upper back   BCC (basal cell carcinoma of skin) 07/05/2018   left mid back   BCC (basal cell carcinoma of skin) 05/20/1992   upper back   BCC (basal cell carcinoma of skin) 07/29/1993   left sholder medial   BCC (basal cell carcinoma of skin) 07/29/1993   left sholder lateral   BCC (basal cell carcinoma of skin) 07/29/1993   right thigh   BCC (basal cell carcinoma of skin) 07/29/1993   right sholder   BCC (basal cell carcinoma of skin) 12/22/1994   right mid forearm   BCC (basal cell carcinoma of skin) 12/22/1994   right upper forearm   BCC (basal cell carcinoma of skin) 12/22/1994   lower right upper arm   BCC (basal cell carcinoma of skin) 12/22/1994   right upper arm sholder   BCC (basal cell carcinoma of skin) 08/11/1995   left leg below knee   BCC (basal cell  carcinoma of skin) 04/11/2002   mid back   BCC (basal cell carcinoma of skin) 12/10/2002   right center upper back   BCC (basal cell carcinoma of skin) 05/26/2005   right post sholder   BCC (basal cell carcinoma) 05/09/2014   left inner shin   BCC (basal cell carcinoma) 06/12/2014   left forearm   Bowen's disease 10/07/1994   right post knee, right inner  forearm/wrist   Bowen's disease 08/11/1995   left sholder   Bronchiectasis ([place])    Cataract    left   Chronic pain    Common variable immunodeficiency [facility])    sees dr Gayleen Orem 02-11-2021 epic   Coronary artery disease    Depression    hx of   Diverticulosis of colon 1998   mild   DJD (degenerative joint disease)    Duodenal ulcer    h/o yrs ago   GERD (gastroesophageal reflux disease)    History of SCC (squamous cell carcinoma) of skin    Dr. Denna Haggard   History of sinus bradycardia    HLD (hyperlipidemia)    hypertriglyceridemia   Hypertensive retinopathy of both eyes 11/18/2017   Hypothyroidism    IBS (irritable bowel syndrome)    Internal hemorrhoids 1998   Lymphocytic colitis    Mitral regurgitation    mild   Nocardiosis    relased by infection disease dec 2022   Osteoarthritis    feet,shoulder,neck,back,hips and hands.   Pneumonia 2015   PONV (postoperative nausea and vomiting)    Rotator cuff tear, right 02/2019   infraspinatus and supraspinatus, and dislocation of long head of bicep tendon (Dr. Alvan Dame)   SCC (squamous cell carcinoma) 11/26/2014   left hand, right hand, right deltoid mnay areas   SCC (squamous cell carcinoma) 05/03/2017   left cheek   Sleep apnea    uses a mouth guard nightly   Squamous cell carcinoma in situ (SCCIS) 07/05/2018   left hand   Tracheobronchomalacia    Vitamin D deficiency    mild    Tobacco Use: Social History   Tobacco Use  Smoking Status Never  Smokeless Tobacco Never    Labs: Review Flowsheet  More data exists      Latest Ref Rng & Units 06/10/2017  07/12/2018 01/11/2019 06/22/2019  Labs for ITP Cardiac and Pulmonary Rehab  Cholestrol 0 - 200 mg/dL 146  145  162  161   LDL (calc) 0 - 99 mg/dL 72  61  85  73   HDL-C >39.00 mg/dL 47  36  47  49   Trlycerides 0.0 - 149.0 mg/dL 135  238  173  197       11/20/2020  Labs for ITP Cardiac and Pulmonary Rehab  Cholestrol 137   LDL (calc) 70   HDL-C 40.90   Trlycerides 129.0     Capillary Blood Glucose: No results found for: "GLUCAP"   Exercise Target Goals: Exercise Program Goal: Individual exercise prescription set using results from initial 6 min walk test and THRR while considering  patient's activity barriers and safety.   Exercise Prescription Goal: Initial exercise prescription builds to 30-45 minutes a day of aerobic activity, 2-3 days per week.  Home exercise guidelines will be given to patient during program as part of exercise prescription that the participant will acknowledge.  Activity Barriers & Risk Stratification:  Activity Barriers & Cardiac Risk Stratification - 08/05/21 1413       Activity Barriers & Cardiac Risk Stratification   Activity Barriers Back Problems;Arthritis;Joint Problems;Neck/Spine Problems;Muscular Weakness;Balance Concerns    Cardiac Risk Stratification High             6 Minute Walk:  6 Minute Walk     Row Name 08/05/21 1412         6 Minute Walk   Phase Initial     Distance 1211 feet     Walk Time 6 minutes     #  of Rest Breaks 0     MPH 2.3     METS 2.53     RPE 11     Perceived Dyspnea  0     VO2 Peak 8.85     Symptoms No     Resting HR 70 bpm     Resting BP 114/60     Resting Oxygen Saturation  98 %     Exercise Oxygen Saturation  during 6 min walk 98 %     Max Ex. HR 75 bpm     Max Ex. BP 142/64     2 Minute Post BP 130/64              Oxygen Initial Assessment:   Oxygen Re-Evaluation:   Oxygen Discharge (Final Oxygen Re-Evaluation):   Initial Exercise Prescription:  Initial Exercise Prescription -  08/05/21 1400       Date of Initial Exercise RX and Referring Provider   Date 08/05/21    Referring Provider [name], MD    Expected Discharge Date 10/03/21      NuStep   Level 2    SPM 75    Minutes 30    METs 2.5      Prescription Details   Frequency (times per week) 3    Duration Progress to 30 minutes of continuous aerobic without signs/symptoms of physical distress      Intensity   THRR 40-80% of Max Heartrate 59-118    Ratings of Perceived Exertion 11-13    Perceived Dyspnea 0-4      Progression   Progression Continue progressive overload as per policy without signs/symptoms or physical distress.      Resistance Training   Training Prescription Yes    Weight 2 lbs    Reps 10-15             Perform Capillary Blood Glucose checks as needed.  Exercise Prescription Changes:   Exercise Comments:   Exercise Goals and Review:   Exercise Goals     Row Name 08/05/21 1417             Exercise Goals   Increase Physical Activity Yes       Intervention Provide advice, education, support and counseling about physical activity/exercise needs.;Develop an individualized exercise prescription for aerobic and resistive training based on initial evaluation findings, risk stratification, comorbidities and participant's personal goals.       Expected Outcomes Short Term: Attend rehab on a regular basis to increase amount of physical activity.;Long Term: Add in home exercise to make exercise part of routine and to increase amount of physical activity.;Long Term: Exercising regularly at least 3-5 days a week.       Increase Strength and Stamina Yes       Intervention Provide advice, education, support and counseling about physical activity/exercise needs.;Develop an individualized exercise prescription for aerobic and resistive training based on initial evaluation findings, risk stratification, comorbidities and participant's personal goals.       Expected Outcomes  Short Term: Increase workloads from initial exercise prescription for resistance, speed, and METs.;Short Term: Perform resistance training exercises routinely during rehab and add in resistance training at home;Long Term: Improve cardiorespiratory fitness, muscular endurance and strength as measured by increased METs and functional capacity (6MWT)       Able to understand and use rate of perceived exertion (RPE) scale Yes       Intervention Provide education and explanation on how to use RPE scale  Expected Outcomes Short Term: Able to use RPE daily in rehab to express subjective intensity level;Long Term:  Able to use RPE to guide intensity level when exercising independently       Knowledge and understanding of Target Heart Rate Range (THRR) Yes       Intervention Provide education and explanation of THRR including how the numbers were predicted and where they are located for reference       Expected Outcomes Short Term: Able to state/look up THRR;Short Term: Able to use daily as guideline for intensity in rehab;Long Term: Able to use THRR to govern intensity when exercising independently       Understanding of Exercise Prescription Yes       Intervention Provide education, explanation, and written materials on patient's individual exercise prescription       Expected Outcomes Short Term: Able to explain program exercise prescription;Long Term: Able to explain home exercise prescription to exercise independently                Exercise Goals Re-Evaluation :   Discharge Exercise Prescription (Final Exercise Prescription Changes):   Nutrition:  Target Goals: Understanding of nutrition guidelines, daily intake of sodium '1500mg'$ , cholesterol '200mg'$ , calories 30% from fat and 7% or less from saturated fats, daily to have 5 or more servings of fruits and vegetables.  Biometrics:  Pre Biometrics - 08/05/21 1135       Pre Biometrics   Waist Circumference 36.25 inches    Hip  Circumference 39.25 inches    Waist to Hip Ratio 0.92 %    Triceps Skinfold 20 mm    % Body Fat 36.3 %    Grip Strength 25 kg    Flexibility --   Not done   Single Leg Stand 10.56 seconds              Nutrition Therapy Plan and Nutrition Goals:   Nutrition Assessments:  MEDIFICTS Score Key: ?70 Need to make dietary changes  40-70 Heart Healthy Diet ? 40 Therapeutic Level Cholesterol Diet    Picture Your Plate Scores: <92 Unhealthy dietary pattern with much room for improvement. 41-50 Dietary pattern unlikely to meet recommendations for good health and room for improvement. 51-60 More healthful dietary pattern, with some room for improvement.  >60 Healthy dietary pattern, although there may be some specific behaviors that could be improved.    Nutrition Goals Re-Evaluation:   Nutrition Goals Re-Evaluation:   Nutrition Goals Discharge (Final Nutrition Goals Re-Evaluation):   Psychosocial: Target Goals: Acknowledge presence or absence of significant depression and/or stress, maximize coping skills, provide positive support system. Participant is able to verbalize types and ability to use techniques and skills needed for reducing stress and depression.  Initial Review & Psychosocial Screening:  Initial Psych Review & Screening - 08/05/21 1525       Initial Review   Current issues with History of Depression;Current Stress Concerns    Source of Stress Concerns Chronic Illness    Comments Beverly [name] needs back surgery which will be deferred due to her recent cardiac stenting.      Family Dynamics   Good Support System? Yes   Beverly [name] lives alone. Beverly [name] has friends in the area and two children one who lives in [place] the other lives in [place] has a history of depression. Beverly [name]'s depression is currently controlled on an antidepressant.      Barriers   Psychosocial barriers to participate in program The patient should benefit  from training  in stress management and relaxation.      Screening Interventions   Interventions Encouraged to exercise             Quality of Life Scores:  Quality of Life - 08/05/21 1406       Quality of Life   Select Quality of Life      Quality of Life Scores   Health/Function Pre 26.46 %    Socioeconomic Pre 30 %    Psych/Spiritual Pre 29.64 %    Family Pre 30 %    GLOBAL Pre 28.32 %            Scores of 19 and below usually indicate a poorer quality of life in these areas.  A difference of  2-3 points is a clinically meaningful difference.  A difference of 2-3 points in the total score of the Quality of Life Index has been associated with significant improvement in overall quality of life, self-image, physical symptoms, and general health in studies assessing change in quality of life.  PHQ-9: Review Flowsheet  More data exists      08/05/2021 12/10/2020 11/20/2020 08/06/2020 02/20/2020  Depression screen PHQ 2/9  Decreased Interest 0 0 0 0 0  Down, Depressed, Hopeless 0 0 0 0 0  PHQ - 2 Score 0 0 0 0 0  Altered sleeping - - 0 - -  Tired, decreased energy - - 0 - -  Change in appetite - - 0 - -  Feeling bad or failure about yourself  - - 0 - -  Trouble concentrating - - 0 - -  Moving slowly or fidgety/restless - - 0 - -  Suicidal thoughts - - 0 - -  PHQ-9 Score - - 0 - -   Interpretation of Total Score  Total Score Depression Severity:  1-4 = Minimal depression, 5-9 = Mild depression, 10-14 = Moderate depression, 15-19 = Moderately severe depression, 20-27 = Severe depression   Psychosocial Evaluation and Intervention:   Psychosocial Re-Evaluation:   Psychosocial Discharge (Final Psychosocial Re-Evaluation):   Vocational Rehabilitation: Provide vocational rehab assistance to qualifying candidates.   Vocational Rehab Evaluation & Intervention:  Vocational Rehab - 08/05/21 1531       Initial Vocational Rehab Evaluation & Intervention   Assessment shows need for  Vocational Rehabilitation No   Beverly [name] is retired and does not need vocational rehab at this time            Education: Education Goals: Education classes will be provided on a weekly basis, covering required topics. Participant will state understanding/return demonstration of topics presented.     Core Videos: Exercise    Move It!  Clinical staff conducted group or individual video education with verbal and written material and guidebook.  Patient learns the recommended Pritikin exercise program. Exercise with the goal of living a long, healthy life. Some of the health benefits of exercise include controlled diabetes, healthier blood pressure levels, improved cholesterol levels, improved heart and lung capacity, improved sleep, and better body composition. Everyone should speak with their doctor before starting or changing an exercise routine.  Biomechanical Limitations Clinical staff conducted group or individual video education with verbal and written material and guidebook.  Patient learns how biomechanical limitations can impact exercise and how we can mitigate and possibly overcome limitations to have an impactful and balanced exercise routine.  Body Composition Clinical staff conducted group or individual video education with verbal and written material and guidebook.  Patient learns  that body composition (ratio of muscle mass to fat mass) is a key component to assessing overall fitness, rather than body weight alone. Increased fat mass, especially visceral belly fat, can put [place] at increased risk for metabolic syndrome, type 2 diabetes, heart disease, and even death. It is recommended to combine diet and exercise (cardiovascular and resistance training) to improve your body composition. Seek guidance from your physician and exercise physiologist before implementing an exercise routine.  Exercise Action Plan Clinical staff conducted group or individual video education with verbal and  written material and guidebook.  Patient learns the recommended strategies to achieve and enjoy long-term exercise adherence, including variety, self-motivation, self-efficacy, and positive decision making. Benefits of exercise include fitness, good health, weight management, more energy, better sleep, less stress, and overall well-being.  Medical   Heart Disease Risk Reduction Clinical staff conducted group or individual video education with verbal and written material and guidebook.  Patient learns our heart is our most vital organ as it circulates oxygen, nutrients, white blood cells, and hormones throughout the entire body, and carries waste away. Data supports a plant-based eating plan like the Pritikin Program for its effectiveness in slowing progression of and reversing heart disease. The video provides a number of recommendations to address heart disease.   Metabolic Syndrome and Belly Fat  Clinical staff conducted group or individual video education with verbal and written material and guidebook.  Patient learns what metabolic syndrome is, how it leads to heart disease, and how one can reverse it and keep it from coming back. You have metabolic syndrome if you have 3 of the following 5 criteria: abdominal obesity, high blood pressure, high triglycerides, low HDL cholesterol, and high blood sugar.  Hypertension and Heart Disease Clinical staff conducted group or individual video education with verbal and written material and guidebook.  Patient learns that high blood pressure, or hypertension, is very common in the [place]. Hypertension is largely due to excessive salt intake, but other important risk factors include being overweight, physical inactivity, drinking too much alcohol, smoking, and not eating enough potassium from fruits and vegetables. High blood pressure is a leading risk factor for heart attack, stroke, congestive heart failure, dementia, kidney failure, and premature  death. Long-term effects of excessive salt intake include stiffening of the arteries and thickening of heart muscle and organ damage. Recommendations include ways to reduce hypertension and the risk of heart disease.  Diseases of Our Time - Focusing on Diabetes Clinical staff conducted group or individual video education with verbal and written material and guidebook.  Patient learns why the best way to stop diseases of our time is prevention, through food and other lifestyle changes. Medicine (such as prescription pills and surgeries) is often only a Band-Aid on the problem, not a long-term solution. Most common diseases of our time include obesity, type 2 diabetes, hypertension, heart disease, and cancer. The Pritikin Program is recommended and has been proven to help reduce, reverse, and/or prevent the damaging effects of metabolic syndrome.  Nutrition   Overview of the Pritikin Eating Plan  Clinical staff conducted group or individual video education with verbal and written material and guidebook.  Patient learns about the [place] for disease risk reduction. The [place] emphasizes a wide variety of unrefined, minimally-processed carbohydrates, like fruits, vegetables, whole grains, and legumes. Go, Caution, and Stop food choices are explained. Plant-based and lean animal proteins are emphasized. Rationale provided for low sodium intake for blood pressure control, low added  sugars for blood sugar stabilization, and low added fats and oils for coronary artery disease risk reduction and weight management.  Calorie Density  Clinical staff conducted group or individual video education with verbal and written material and guidebook.  Patient learns about calorie density and how it impacts the Pritikin Eating Plan. Knowing the characteristics of the food you choose will help you decide whether those foods will lead to weight gain or weight loss, and whether you want to consume  more or less of them. Weight loss is usually a side effect of the Pritikin Eating Plan because of its focus on low calorie-dense foods.  Label Reading  Clinical staff conducted group or individual video education with verbal and written material and guidebook.  Patient learns about the Pritikin recommended label reading guidelines and corresponding recommendations regarding calorie density, added sugars, sodium content, and whole grains.  Dining Out - Part 1  Clinical staff conducted group or individual video education with verbal and written material and guidebook.  Patient learns that restaurant meals can be sabotaging because they can be so high in calories, fat, sodium, and/or sugar. Patient learns recommended strategies on how to positively address this and avoid unhealthy pitfalls.  Facts on Fats  Clinical staff conducted group or individual video education with verbal and written material and guidebook.  Patient learns that lifestyle modifications can be just as effective, if not more so, as many medications for lowering your risk of heart disease. A Pritikin lifestyle can help to reduce your risk of inflammation and atherosclerosis (cholesterol build-up, or plaque, in the artery walls). Lifestyle interventions such as dietary choices and physical activity address the cause of atherosclerosis. A review of the types of fats and their impact on blood cholesterol levels, along with dietary recommendations to reduce fat intake is also included.  Nutrition Action Plan  Clinical staff conducted group or individual video education with verbal and written material and guidebook.  Patient learns how to incorporate Pritikin recommendations into their lifestyle. Recommendations include planning and keeping personal health goals in mind as an important part of their success.  Healthy Mind-Set    Healthy Minds, Bodies, Hearts  Clinical staff conducted group or individual video education with verbal and  written material and guidebook.  Patient learns how to identify when they are stressed. Video will discuss the impact of that stress, as well as the many benefits of stress management. Patient will also be introduced to stress management techniques. The way we think, act, and feel has an impact on our hearts.  How Our Thoughts Can Heal Our Hearts  Clinical staff conducted group or individual video education with verbal and written material and guidebook.  Patient learns that negative thoughts can cause depression and anxiety. This can result in negative lifestyle behavior and serious health problems. Cognitive behavioral therapy is an effective method to help control our thoughts in order to change and improve our emotional outlook.  Additional Videos:  Exercise    Improving Performance  Clinical staff conducted group or individual video education with verbal and written material and guidebook.  Patient learns to use a non-linear approach by alternating intensity levels and lengths of time spent exercising to help burn more calories and lose more body fat. Cardiovascular exercise helps improve heart health, metabolism, hormonal balance, blood sugar control, and recovery from fatigue. Resistance training improves strength, endurance, balance, coordination, reaction time, metabolism, and muscle mass. Flexibility exercise improves circulation, posture, and balance. Seek guidance from your physician and exercise  physiologist before implementing an exercise routine and learn your capabilities and proper form for all exercise.  Introduction to Yoga  Clinical staff conducted group or individual video education with verbal and written material and guidebook.  Patient learns about yoga, a discipline of the coming together of mind, breath, and body. The benefits of yoga include improved flexibility, improved range of motion, better posture and core strength, increased lung function, weight loss, and positive  self-image. Yoga's heart health benefits include lowered blood pressure, healthier heart rate, decreased cholesterol and triglyceride levels, improved immune function, and reduced stress. Seek guidance from your physician and exercise physiologist before implementing an exercise routine and learn your capabilities and proper form for all exercise.  Medical   Aging: Enhancing Your Quality of Life  Clinical staff conducted group or individual video education with verbal and written material and guidebook.  Patient learns key strategies and recommendations to stay in good physical health and enhance quality of life, such as prevention strategies, having an advocate, securing a [place], and keeping a list of medications and system for tracking them. It also discusses how to avoid risk for bone loss.  Biology of Weight Control  Clinical staff conducted group or individual video education with verbal and written material and guidebook.  Patient learns that weight gain occurs because we consume more calories than we burn (eating more, moving less). Even if your body weight is normal, you may have higher ratios of fat compared to muscle mass. Too much body fat puts you at increased risk for cardiovascular disease, heart attack, stroke, type 2 diabetes, and obesity-related cancers. In addition to exercise, following the [place] can help reduce your risk.  Decoding Lab Results  Clinical staff conducted group or individual video education with verbal and written material and guidebook.  Patient learns that lab test reflects one measurement whose values change over time and are influenced by many factors, including medication, stress, sleep, exercise, food, hydration, pre-existing medical conditions, and more. It is recommended to use the knowledge from this video to become more involved with your lab results and evaluate your numbers to speak with your  doctor.   Diseases of Our Time - Overview  Clinical staff conducted group or individual video education with verbal and written material and guidebook.  Patient learns that according to the CDC, 50% to 70% of chronic diseases (such as obesity, type 2 diabetes, elevated lipids, hypertension, and heart disease) are avoidable through lifestyle improvements including healthier food choices, listening to satiety cues, and increased physical activity.  Sleep Disorders Clinical staff conducted group or individual video education with verbal and written material and guidebook.  Patient learns how good quality and duration of sleep are important to overall health and well-being. Patient also learns about sleep disorders and how they impact health along with recommendations to address them, including discussing with a physician.  Nutrition  Dining Out - Part 2 Clinical staff conducted group or individual video education with verbal and written material and guidebook.  Patient learns how to plan ahead and communicate in order to maximize their dining experience in a healthy and nutritious manner. Included are recommended food choices based on the type of restaurant the patient is visiting.   Fueling a Best boy conducted group or individual video education with verbal and written material and guidebook.  There is a strong connection between our food choices and our health. Diseases like obesity and type  2 diabetes are very prevalent and are in large-part due to lifestyle choices. The Pritikin Eating Plan provides plenty of food and hunger-curbing satisfaction. It is easy to follow, affordable, and helps reduce health risks.  Menu Workshop  Clinical staff conducted group or individual video education with verbal and written material and guidebook.  Patient learns that restaurant meals can sabotage health goals because they are often packed with calories, fat, sodium, and sugar.  Recommendations include strategies to plan ahead and to communicate with the manager, chef, or server to help order a healthier meal.  Planning Your Eating Strategy  Clinical staff conducted group or individual video education with verbal and written material and guidebook.  Patient learns about the [place] and its benefit of reducing the risk of disease. The [place] does not focus on calories. Instead, it emphasizes high-quality, nutrient-rich foods. By knowing the characteristics of the foods, we choose, we can determine their calorie density and make informed decisions.  Targeting Your Nutrition Priorities  Clinical staff conducted group or individual video education with verbal and written material and guidebook.  Patient learns that lifestyle habits have a tremendous impact on disease risk and progression. This video provides eating and physical activity recommendations based on your personal health goals, such as reducing LDL cholesterol, losing weight, preventing or controlling type 2 diabetes, and reducing high blood pressure.  Vitamins and Minerals  Clinical staff conducted group or individual video education with verbal and written material and guidebook.  Patient learns different ways to obtain key vitamins and minerals, including through a recommended healthy diet. It is important to discuss all supplements you take with your doctor.   Healthy Mind-Set    Smoking Cessation  Clinical staff conducted group or individual video education with verbal and written material and guidebook.  Patient learns that cigarette smoking and tobacco addiction pose a serious health risk which affects millions of people. Stopping smoking will significantly reduce the risk of heart disease, lung disease, and many forms of cancer. Recommended strategies for quitting are covered, including working with your doctor to develop a successful plan.  Culinary   Becoming a Hydrologist conducted group or individual video education with verbal and written material and guidebook.  Patient learns that cooking at home can be healthy, cost-effective, quick, and puts them in control. Keys to cooking healthy recipes will include looking at your recipe, assessing your equipment needs, planning ahead, making it simple, choosing cost-effective seasonal ingredients, and limiting the use of added fats, salts, and sugars.  Cooking - Breakfast and Snacks  Clinical staff conducted group or individual video education with verbal and written material and guidebook.  Patient learns how important breakfast is to satiety and nutrition through the entire day. Recommendations include key foods to eat during breakfast to help stabilize blood sugar levels and to prevent overeating at meals later in the day. Planning ahead is also a key component.  Cooking - Human resources officer conducted group or individual video education with verbal and written material and guidebook.  Patient learns eating strategies to improve overall health, including an approach to cook more at home. Recommendations include thinking of animal protein as a side on your plate rather than center stage and focusing instead on lower calorie dense options like vegetables, fruits, whole grains, and plant-based proteins, such as beans. Making sauces in large quantities to freeze for later and leaving the skin on your vegetables are also recommended  to maximize your experience.  Cooking - Healthy Salads and Dressing Clinical staff conducted group or individual video education with verbal and written material and guidebook.  Patient learns that vegetables, fruits, whole grains, and legumes are the foundations of the [place]. Recommendations include how to incorporate each of these in flavorful and healthy salads, and how to create homemade salad dressings. Proper handling of ingredients is also covered.  Cooking - Soups and [employer] - Soups and Desserts Clinical staff conducted group or individual video education with verbal and written material and guidebook.  Patient learns that Pritikin soups and desserts make for easy, nutritious, and delicious snacks and meal components that are low in sodium, fat, sugar, and calorie density, while high in vitamins, minerals, and filling fiber. Recommendations include simple and healthy ideas for soups and desserts.   Overview     The Pritikin Solution Program Overview Clinical staff conducted group or individual video education with verbal and written material and guidebook.  Patient learns that the results of the [place] Program have been documented in more than 100 articles published in peer-reviewed journals, and the benefits include reducing risk factors for (and, in some cases, even reversing) high cholesterol, high blood pressure, type 2 diabetes, obesity, and more! An overview of the three key pillars of the Pritikin Program will be covered: eating well, doing regular exercise, and having a healthy mind-set.  WORKSHOPS  Exercise: Exercise Basics: Building Your Action Plan Clinical staff led group instruction and group discussion with PowerPoint presentation and patient guidebook. To enhance the learning environment the use of posters, models and videos may be added. At the conclusion of this workshop, patients will comprehend the difference between physical activity and exercise, as well as the benefits of incorporating both, into their routine. Patients will understand the FITT (Frequency, Intensity, Time, and Type) principle and how to use it to build an exercise action plan. In addition, safety concerns and other considerations for exercise and cardiac rehab will be addressed by the presenter. The purpose of this lesson is to promote a comprehensive and effective weekly exercise routine in order to improve patients' overall level of  fitness.   Managing Heart Disease: Your Path to a Healthier Heart Clinical staff led group instruction and group discussion with PowerPoint presentation and patient guidebook. To enhance the learning environment the use of posters, models and videos may be added.At the conclusion of this workshop, patients will understand the anatomy and physiology of the heart. Additionally, they will understand how Pritikin's three pillars impact the risk factors, the progression, and the management of heart disease.  The purpose of this lesson is to provide a high-level overview of the heart, heart disease, and how the Pritikin lifestyle positively impacts risk factors.  Exercise Biomechanics Clinical staff led group instruction and group discussion with PowerPoint presentation and patient guidebook. To enhance the learning environment the use of posters, models and videos may be added. Patients will learn how the structural parts of their bodies function and how these functions impact their daily activities, movement, and exercise. Patients will learn how to promote a neutral spine, learn how to manage pain, and identify ways to improve their physical movement in order to promote healthy living. The purpose of this lesson is to expose patients to common physical limitations that impact physical activity. Participants will learn practical ways to adapt and manage aches and pains, and to minimize their effect on regular exercise. Patients will learn how to maintain good  posture while sitting, walking, and lifting.  Balance Training and Fall Prevention  Clinical staff led group instruction and group discussion with PowerPoint presentation and patient guidebook. To enhance the learning environment the use of posters, models and videos may be added. At the conclusion of this workshop, patients will understand the importance of their sensorimotor skills (vision, proprioception, and the vestibular system)  in maintaining their ability to balance as they age. Patients will apply a variety of balancing exercises that are appropriate for their current level of function. Patients will understand the common causes for poor balance, possible solutions to these problems, and ways to modify their physical environment in order to minimize their fall risk. The purpose of this lesson is to teach patients about the importance of maintaining balance as they age and ways to minimize their risk of falling.  WORKSHOPS   Nutrition:  Fueling a Scientist, research (physical sciences) led group instruction and group discussion with PowerPoint presentation and patient guidebook. To enhance the learning environment the use of posters, models and videos may be added. Patients will review the foundational principles of the [place] and understand what constitutes a serving size in each of the food groups. Patients will also learn Pritikin-friendly foods that are better choices when away from home and review make-ahead meal and snack options. Calorie density will be reviewed and applied to three nutrition priorities: weight maintenance, weight loss, and weight gain. The purpose of this lesson is to reinforce (in a group setting) the key concepts around what patients are recommended to eat and how to apply these guidelines when away from home by planning and selecting Pritikin-friendly options. Patients will understand how calorie density may be adjusted for different weight management goals.  Mindful Eating  Clinical staff led group instruction and group discussion with PowerPoint presentation and patient guidebook. To enhance the learning environment the use of posters, models and videos may be added. Patients will briefly review the concepts of the [place] and the importance of low-calorie dense foods. The concept of mindful eating will be introduced as well as the importance of paying attention to internal hunger  signals. Triggers for non-hunger eating and techniques for dealing with triggers will be explored. The purpose of this lesson is to provide patients with the opportunity to review the basic principles of the [place], discuss the value of eating mindfully and how to measure internal cues of hunger and fullness using the Hunger Scale. Patients will also discuss reasons for non-hunger eating and learn strategies to use for controlling emotional eating.  Targeting Your Nutrition Priorities Clinical staff led group instruction and group discussion with PowerPoint presentation and patient guidebook. To enhance the learning environment the use of posters, models and videos may be added. Patients will learn how to determine their genetic susceptibility to disease by reviewing their family history. Patients will gain insight into the importance of diet as part of an overall healthy lifestyle in mitigating the impact of genetics and other environmental insults. The purpose of this lesson is to provide patients with the opportunity to assess their personal nutrition priorities by looking at their family history, their own health history and current risk factors. Patients will also be able to discuss ways of prioritizing and modifying the [place] for their highest risk areas  Menu  Clinical staff led group instruction and group discussion with PowerPoint presentation and patient guidebook. To enhance the learning environment the use of posters, models and videos  may be added. Using menus brought in from [employer], or printed from [employer], patients will apply the [place] dining out guidelines that were presented in the [employer] video. Patients will also be able to practice these guidelines in a variety of provided scenarios. The purpose of this lesson is to provide patients with the opportunity to practice hands-on learning of the [place]  with actual menus and practice scenarios.  Label Reading Clinical staff led group instruction and group discussion with PowerPoint presentation and patient guidebook. To enhance the learning environment the use of posters, models and videos may be added. Patients will review and discuss the Pritikin label reading guidelines presented in Pritikin's Label Reading Educational series video. Using fool labels brought in from local grocery stores and markets, patients will apply the label reading guidelines and determine if the packaged food meet the Pritikin guidelines. The purpose of this lesson is to provide patients with the opportunity to review, discuss, and practice hands-on learning of the Pritikin Label Reading guidelines with actual packaged food labels. [place] Workshops are designed to teach patients ways to prepare quick, simple, and affordable recipes at home. The importance of nutrition's role in chronic disease risk reduction is reflected in its emphasis in the overall Pritikin program. By learning how to prepare essential core Pritikin Eating Plan recipes, patients will increase control over what they eat; be able to customize the flavor of foods without the use of added salt, sugar, or fat; and improve the quality of the food they consume. By learning a set of core recipes which are easily assembled, quickly prepared, and affordable, patients are more likely to prepare more healthy foods at home. These workshops focus on convenient breakfasts, simple entres, side dishes, and desserts which can be prepared with minimal effort and are consistent with nutrition recommendations for cardiovascular risk reduction. Cooking [employer] are taught by a Engineer, materials (RD) who has been trained by the [employer]. The chef or RD has a clear understanding of the importance of minimizing - if not completely eliminating - added fat, sugar, and  sodium in recipes. Throughout the series of [place] Workshop sessions, patients will learn about healthy ingredients and efficient methods of cooking to build confidence in their capability to prepare    Cooking School weekly topics:  Adding Flavor- Sodium-Free  Fast and Healthy Breakfasts  Powerhouse Plant-Based Proteins  Satisfying Salads and Dressings  Simple Sides and Sauces  International Cuisine-Spotlight on the [place] Zones  Delicious Desserts  Savory Soups  Efficiency Cooking - Meals in a Snap  Tasty Appetizers and Snacks  Comforting Weekend Breakfasts  One-Pot Wonders   Fast Evening Meals  Easy [place] (Psychosocial): New Thoughts, New Behaviors Clinical staff led group instruction and group discussion with PowerPoint presentation and patient guidebook. To enhance the learning environment the use of posters, models and videos may be added. Patients will learn and practice techniques for developing effective health and lifestyle goals. Patients will be able to effectively apply the goal setting process learned to develop at least one new personal goal.  The purpose of this lesson is to expose patients to a new skill set of behavior modification techniques such as techniques setting SMART goals, overcoming barriers, and achieving new thoughts and new behaviors.  Managing Moods and Relationships Clinical staff led group instruction and group discussion with  PowerPoint presentation and patient guidebook. To enhance the learning environment the use of posters, models and videos may be added. Patients will learn how emotional and chronic stress factors can impact their health and relationships. They will learn healthy ways to manage their moods and utilize positive coping mechanisms. In addition, ICR patients will learn ways to improve communication skills. The purpose of this lesson is to expose patients to ways  of understanding how one's mood and health are intimately connected. Developing a healthy outlook can help build positive relationships and connections with others. Patients will understand the importance of utilizing effective communication skills that include actively listening and being heard. They will learn and understand the importance of the "4 Cs" and especially Connections in fostering of a Healthy Mind-Set.  Healthy Sleep for a Healthy Heart Clinical staff led group instruction and group discussion with PowerPoint presentation and patient guidebook. To enhance the learning environment the use of posters, models and videos may be added. At the conclusion of this workshop, patients will be able to demonstrate knowledge of the importance of sleep to overall health, well-being, and quality of life. They will understand the symptoms of, and treatments for, common sleep disorders. Patients will also be able to identify daytime and nighttime behaviors which impact sleep, and they will be able to apply these tools to help manage sleep-related challenges. The purpose of this lesson is to provide patients with a general overview of sleep and outline the importance of quality sleep. Patients will learn about a few of the most common sleep disorders. Patients will also be introduced to the concept of "sleep hygiene," and discover ways to self-manage certain sleeping problems through simple daily behavior changes. Finally, the workshop will motivate patients by clarifying the links between quality sleep and their goals of heart-healthy living.   Recognizing and Reducing Stress Clinical staff led group instruction and group discussion with PowerPoint presentation and patient guidebook. To enhance the learning environment the use of posters, models and videos may be added. At the conclusion of this workshop, patients will be able to understand the types of stress reactions, differentiate between acute and chronic  stress, and recognize the impact that chronic stress has on their health. They will also be able to apply different coping mechanisms, such as reframing negative self-talk. Patients will have the opportunity to practice a variety of stress management techniques, such as deep abdominal breathing, progressive muscle relaxation, and/or guided imagery.  The purpose of this lesson is to educate patients on the role of stress in their lives and to provide healthy techniques for coping with it.  Learning Barriers/Preferences:  Learning Barriers/Preferences - 08/05/21 1408       Learning Barriers/Preferences   Learning Barriers Sight   wears glasses   Learning Preferences Computer/Internet;Skilled Demonstration;Video;Written Material;Group Instruction;Individual Instruction;Pictoral             Education Topics:  Knowledge Questionnaire Score:  Knowledge Questionnaire Score - 08/05/21 1409       Knowledge Questionnaire Score   Pre Score 19/24             Core Components/Risk Factors/Patient Goals at Admission:  Personal Goals and Risk Factors at Admission - 08/05/21 1411       Core Components/Risk Factors/Patient Goals on Admission    Weight Management Weight Maintenance    Lipids Yes    Intervention Provide education and support for participant on nutrition & aerobic/resistive exercise along with prescribed medications to achieve LDL '70mg'$ , HDL >'40mg'$ .  Expected Outcomes Short Term: Participant states understanding of desired cholesterol values and is compliant with medications prescribed. Participant is following exercise prescription and nutrition guidelines.;Long Term: Cholesterol controlled with medications as prescribed, with individualized exercise RX and with personalized nutrition plan. Value goals: LDL < '70mg'$ , HDL > 40 mg.             Core Components/Risk Factors/Patient Goals Review:    Core Components/Risk Factors/Patient Goals at Discharge (Final Review):     ITP Comments:  ITP Comments     Row Name 08/05/21 1218           ITP Comments Dr Fransico Him MD, Medical Director, Introduction to [facility]/ Intensive Cardiac Rehab Program. Initial [facility] Orientation Packet reviewed with the patient.                Comments:Participant attended orientation for the cardiac rehabilitation program on  08/05/2021  to perform initial intake and exercise walk test. Patient introduced to the [facility] education and orientation packet was reviewed. Completed 6-minute walk test, measurements, initial ITP, and exercise prescription. Vital signs stable. Telemetry-normal sinus rhythm with a first degree heart block with arrhythmia. occasional PVC noted, asymptomatic. Will send today's ECG tracings to Dr Kyla Balzarine office for review as no history of PVC's is noted. Tearra used a rolling walker for stability.Harrell Gave RN BSN   Service time was from 1125 to 1320.

## 2021-08-06 ENCOUNTER — Other Ambulatory Visit (HOSPITAL_COMMUNITY): Payer: Self-pay

## 2021-08-06 ENCOUNTER — Other Ambulatory Visit: Payer: Self-pay | Admitting: Cardiology

## 2021-08-06 ENCOUNTER — Other Ambulatory Visit: Payer: Medicare PPO

## 2021-08-06 DIAGNOSIS — R197 Diarrhea, unspecified: Secondary | ICD-10-CM

## 2021-08-07 ENCOUNTER — Other Ambulatory Visit (HOSPITAL_COMMUNITY): Payer: Self-pay

## 2021-08-07 ENCOUNTER — Telehealth: Payer: Self-pay

## 2021-08-07 LAB — GI PROFILE, STOOL, PCR

## 2021-08-07 MED ORDER — PANTOPRAZOLE SODIUM 40 MG PO TBEC
40.0000 mg | DELAYED_RELEASE_TABLET | Freq: Every day | ORAL | 3 refills | Status: DC
  Filled 2021-08-07: qty 90, 90d supply, fill #0
  Filled 2021-11-02: qty 90, 90d supply, fill #1

## 2021-08-07 NOTE — Telephone Encounter (Signed)
-----   Message from Beverly Kiel, RN sent at 08/06/2021  8:32 AM EDT ----- Regarding: RE: PVC's Thanks Beverly [name]! ----- Message ----- From: Beverly Barter, RN Sent: 08/05/2021   5:16 PM EDT To: Beverly [name], MD; Beverly Kiel, RN Subject: RE: PVC's                                      Hello Beverly [name],  Beverly. [name] is out of the office this week. I can have our DOD review it.  Thank you, Pam, RN ----- Message ----- From: Beverly Kiel, RN Sent: 08/05/2021   4:17 PM EDT To: Beverly [name], MD; Beverly Barter, RN Subject: PVC's                                          Good afternoon Beverly Johnsie Cancel,  Beverly Beverly [name] completed cardiac rehab this morning without difficulty. I want to bring it to your attention that Beverly [name] had some occasional PVC's. I faxed the ECG tracings over to your office for your review as I do not see a previous history of PVC's . Beverly [name] has a follow up appointment with you on 08/13/21.   Beverly [name] will start exercise on Monday.  Thank you!  Sincerely,  Beverly Pall RN Cardiac Rehab

## 2021-08-07 NOTE — Telephone Encounter (Signed)
Received fax of PVC's on rhythm strip from Cardiac Rehab. Dr. Johney Frame, DOD, reviewed. Per Dr. Johney Frame, Exercise induced PVC's, not on beta blocker due to base line bradycardia. Just continue to monitor. Will make Cardiac Rehab aware.

## 2021-08-08 ENCOUNTER — Telehealth: Payer: Self-pay

## 2021-08-08 LAB — CLOSTRIDIUM DIFFICILE BY PCR: Toxigenic C. Difficile by PCR: NEGATIVE

## 2021-08-09 ENCOUNTER — Other Ambulatory Visit (HOSPITAL_COMMUNITY): Payer: Self-pay

## 2021-08-10 NOTE — Progress Notes (Signed)
Cardiology Office Note    Date:  08/13/2021   ID:  Beza, Steppe [birth year], MRN [id number]   PCP:  Isaac Bliss, Rayford Halsted, MD   [place]  Cardiologist:  [name], MD   Advanced Practice Provider:  No care team member to display Electrophysiologist:  None   1  History of Present Illness:   Beverly [name] is a 76 y.o. female  last seen by me 07/06/19 History of coronary calcification on CT 06/2019,SVT/nocturnal bradycardia, chest pain with normal NST 06/2019, HLD, OSA, ILD with severe tracheobronchomalacia followed by pulmonary. I saw the patient for preop evaluation for potential lumbar surgery on 05/28/21 Dr. Rolena Infante with abdominal exposure for OLIF at L3-L5 with Dr. Carlis Abbott.  Coronary CTA suggestive of tight LAD and ramus. Surgery was canceled and she had cardiac cath 05/19/21 with DES LAD and DES ramus. Plan for Plavix/ASA for at least 6 months. Counseled on taking celebrex but she can't stop b/c of pain. Low dose losartan added.   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   right sholder   BCC (basal cell carcinoma of skin) 05/09/2014   mid lower back   BCC (basal cell carcinoma of skin) 05/03/2017   right low back   BCC (basal cell carcinoma of skin) 05/03/2017   left upper back   BCC (basal cell carcinoma of skin) 07/05/2018   left mid back   BCC (basal cell carcinoma of skin) 05/20/1992   upper back   BCC (basal cell carcinoma of skin) 07/29/1993   left sholder medial   BCC  (basal cell carcinoma of skin) 07/29/1993   left sholder lateral   BCC (basal cell carcinoma of skin) 07/29/1993   right thigh   BCC (basal cell carcinoma of skin) 07/29/1993   right sholder   BCC (basal cell carcinoma of skin) 12/22/1994   right mid forearm   BCC (basal cell carcinoma of skin) 12/22/1994   right upper forearm   BCC (basal cell carcinoma of skin) 12/22/1994   lower right upper arm   BCC (basal cell carcinoma of skin) 12/22/1994   right upper arm sholder   BCC (basal cell carcinoma of skin) 08/11/1995   left leg below knee   BCC (basal cell carcinoma of skin) 04/11/2002   mid back   BCC (basal cell carcinoma of skin) 12/10/2002   right center upper back   BCC (basal cell carcinoma of skin) 05/26/2005   right post sholder   BCC (basal cell carcinoma) 05/09/2014   left inner shin   BCC (basal cell carcinoma) 06/12/2014   left forearm   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 Denna Haggard   History of sinus bradycardia    HLD (hyperlipidemia)    hypertriglyceridemia   Hypertensive retinopathy of both eyes 11/18/2017   Hypothyroidism    IBS (irritable bowel syndrome)    Internal hemorrhoids 1998   Lymphocytic colitis    Mitral regurgitation    mild   Nocardiosis    relased by infection disease dec 2022   Osteoarthritis    feet,shoulder,neck,back,hips and hands.   Pneumonia 2015   PONV (postoperative nausea and vomiting)    Rotator cuff tear, right 02/2019   infraspinatus and  supraspinatus, and dislocation of long head of bicep tendon (Dr. Alvan Dame)   SCC (squamous cell carcinoma) 11/26/2014   left hand, right hand, right deltoid mnay areas   SCC (squamous cell carcinoma) 05/03/2017   left cheek   Sleep apnea    uses a mouth guard nightly   Squamous cell carcinoma in situ (SCCIS) 07/05/2018   left hand   Tracheobronchomalacia    Vitamin D deficiency    mild    Past Surgical History:  Procedure Laterality Date   ABDOMINAL HYSTERECTOMY  1998   complete   BLEPHAROPLASTY Bilateral 01/2018   BROW LIFT Bilateral 10/10/2018   Procedure: BROW LIFT;  Surgeon: [facility] Going, DO;  Location: [facility];  Service: Plastics;  Laterality: Bilateral;   BUNIONECTOMY     R 12/08, L 2004 (Dr. Janus Molder)   [place]   bilateral   CARPAL TUNNEL RELEASE Left 03/06/2021   Procedure: Left Revision Carpal Tunnel Release with hypothenar fat pad flap;  Surgeon: [name], MD;  Location: [facility];  Service: Orthopedics;  Laterality: Left;  with local anesthesia   CATARACT EXTRACTION Bilateral Left in 08/2012, Right 04/2016   Dr.Hecker   CESAREAN SECTION     1981 and [place]   laparoscopic   CLOSED REDUCTION NASAL FRACTURE N/A 08/29/2018   Procedure: CLOSED REDUCTION NASAL FRACTURE;  Surgeon: [facility] Going, DO;  Location: [facility];  Service: Plastics;  Laterality: N/A;  1 hour, please   COLONOSCOPY  2006   COLONOSCOPY  01/2012   due again 01/2021; mild diverticulosis   CORONARY STENT INTERVENTION N/A 05/19/2021   Procedure: CORONARY STENT INTERVENTION;  Surgeon: Nelva Bush, MD;  Location: [facility];  Service: Cardiovascular;  Laterality: N/A;   epidural steroid injection, back  02/2010   HIP SURGERY     right bursectomy x 3   HIP SURGERY Right 2000   torn cartilage, repaired   INGUINAL HERNIA REPAIR  10/2007   bilat    INTRAVASCULAR ULTRASOUND/IVUS N/A 05/19/2021   Procedure: Intravascular Ultrasound/IVUS;  Surgeon: Nelva Bush, MD;  Location: [facility];  Service: Cardiovascular;  Laterality: N/A;   LEFT HEART CATH AND CORONARY ANGIOGRAPHY N/A 05/19/2021   Procedure: LEFT HEART CATH AND CORONARY ANGIOGRAPHY;  Surgeon: Nelva Bush, MD;  Location: [facility];  Service: Cardiovascular;  Laterality: N/A;   NECK SURGERY  1989   c6-7 cervical laminectomy and diskecotmy   REVERSE SHOULDER ARTHROPLASTY  Right 04/11/2019   Procedure: REVERSE SHOULDER ARTHROPLASTY;  Surgeon: Nicholes Stairs, MD;  Location: [facility];  Service: Orthopedics;  Laterality: Right;  Regional Block   SHOULDER SURGERY Left 03/2003   left rotator cuff repair   SHOULDER SURGERY Left 03/14/2018   rotator cuff repair; Dr. Tonita Cong   TONSILLECTOMY  age 7   [place] EXTRACTION     as teenager    Current Medications: Current Meds  Medication Sig   acetaminophen (TYLENOL) 500 MG tablet Take 1,000 mg by mouth every 6 (six) hours as needed for moderate pain.    albuterol (VENTOLIN HFA) 108 (90 Base) MCG/ACT inhaler Inhale 2 puffs into the lungs every 6 (six) hours as needed for wheezing or shortness of breath.   aspirin EC 81 MG tablet Take 81 mg by mouth daily.   atorvastatin (LIPITOR) 40 MG tablet Take 1 tablet by mouth daily.   budesonide (ENTOCORT EC) 3 MG 24 hr capsule Take 3 capsules (9 mg total) by mouth daily for 28 days, THEN 2 capsules (6 mg total) daily for 56 days, THEN 1 capsule (3 mg total) daily.   Buprenorphine HCl (BELBUCA) 750 MCG FILM Place 1 film to inside of mouth every twelve hours   Calcium Carbonate-Vitamin D 600-200 MG-UNIT TABS Take 1 tablet by mouth 2 (two) times daily.   celecoxib (CELEBREX) 200 MG capsule TAKE 1 CAPSULE BY MOUTH 2 TIMES DAILY   colestipol (COLESTID) 1 g tablet Take 1 tablet by mouth daily.   diclofenac  Sodium (VOLTAREN) 1 % GEL Apply 2 g topically 4 (four) times daily as needed (joint pain).   Docusate Sodium 100 MG capsule Take 100 mg by mouth daily.   EPINEPHrine 0.3 mg/0.3 mL IJ SOAJ injection Inject 0.3 mg into the muscle as needed for anaphylaxis.   famotidine (PEPCID) 40 MG tablet Take 1 tablet (40 mg total) by mouth at bedtime.   ferrous sulfate 324 MG TBEC Take 324 mg by mouth daily with breakfast.   Fluorouracil (TOLAK) 4 % CREA Apply to aa total 28 applications & sun protect   gabapentin (NEURONTIN) 800 MG tablet Take 1 tablet by mouth at bedtime.   guaiFENesin (MUCINEX) 600 MG 12 hr tablet Take 600 mg by mouth 2 (two) times daily as needed for cough or to loosen phlegm.   HYDROcodone-acetaminophen (NORCO) 7.5-325 MG tablet Take 1 tablet by mouth four times a day as needed for pain   ipratropium (ATROVENT) 0.03 % nasal spray Place 1 spray into both nostrils 3 (three) times daily.   Lactobacillus-Inulin (CULTURELLE ADULT ULT BALANCE) CAPS Take 1 capsule by mouth daily.   levothyroxine (SYNTHROID) 25 MCG tablet Take 1 tablet (25 mcg total) by mouth daily.   loratadine (CLARITIN) 10 MG tablet Take 1 tablet (10 mg total) by mouth daily as needed for allergies (Can take an extra dose during flare ups.).   losartan (COZAAR) 25 MG tablet Take 1/2 tablet (12.5 mg total) by mouth daily.   Melatonin-Pyridoxine (MELATIN PO) Take 10 mg by mouth at bedtime.   methocarbamol (ROBAXIN) 500 MG tablet Take 1 tablet by mouth three times a day as needed   Misc Natural Products (FIBER 7 PO) Take 2 capsules by mouth daily.   montelukast (SINGULAIR) 10 MG tablet Take 1 tablet (10 mg total) by mouth at bedtime.   Multiple Vitamins-Minerals (MULTIVITAMIN WITH MINERALS) tablet Take 1 tablet by mouth daily.  nitroGLYCERIN (NITROSTAT) 0.4 MG SL tablet Place 1 tablet (0.4 mg total) under the tongue every 5 (five) minutes as needed.   omega-3 acid ethyl esters (LOVAZA) 1 g capsule TAKE 2 CAPSULES BY MOUTH 2 TIMES  DAILY   ondansetron (ZOFRAN-ODT) 4 MG disintegrating tablet Take 1 tablet by mouth every 6 (six) hours as needed for nausea or vomiting.   pantoprazole (PROTONIX) 40 MG tablet Take 1 tablet (40 mg total) by mouth daily with breakfast.   PARoxetine (PAXIL) 30 MG tablet Take 1 tablet by mouth once a day.   ticagrelor (BRILINTA) 90 MG TABS tablet Take 1 tablet by mouth 2 times daily. (stop Plavix (clopidogrel))   Vitamin D, Ergocalciferol, (DRISDOL) 1.25 MG (50000 UNIT) CAPS capsule Take 1 capsule (50,000 Units total) by mouth every 7 (seven) days for 12 doses.   XEMBIFY 10 GM/50ML SOLN Inject 10 g into the skin once a week.     Allergies:   Plavix [clopidogrel], Adhesive [tape], and Codeine   Social History   Socioeconomic History   Marital status: Divorced    Spouse name: Not on file   Number of children: 2   Years of education: Not on file   Highest education level: Master's degree (e.g., MA, MS, MEng, MEd, MSW, MBA)  Occupational History   Occupation: STAFF Optician, dispensing: [employer]   Occupation: Retired  Tobacco Use   Smoking status: Never   Smokeless tobacco: Never  Vaping Use   Vaping Use: Never used  Substance and Sexual Activity   Alcohol use: Yes    Alcohol/week: 7.0 standard drinks of alcohol    Types: 7 Glasses of wine per week    Comment: 1 glass wine per day   Drug use: No   Sexual activity: Not Currently  Other Topics Concern   Not on file  Social History Narrative   Divorced, lives alone, [place]; 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   [place] for [employer] part-time   Social Determinants of Health   Financial Resource Strain: [place]  (05/17/2021)   Overall Financial Resource Strain (CARDIA)    Difficulty of Paying Living Expenses: Not hard at all  Food Insecurity: [place] (05/17/2021)   Hunger Vital  Sign    Worried About Running Out of Food in the Last Year: Never true    [place] in the Last Year: Never true  Transportation Needs: No Transportation Needs (05/17/2021)   PRAPARE - Hydrologist (Medical): No    Lack of Transportation (Non-Medical): No  Physical Activity: Insufficiently Active (05/17/2021)   Exercise Vital Sign    Days of Exercise per Week: 1 day    Minutes of Exercise per Session: 10 min  Stress: No Stress Concern Present (05/17/2021)   [place]    Feeling of Stress : Not at all  Social Connections: Moderately Integrated (05/17/2021)   Social Connection and Isolation Panel [NHANES]    Frequency of Communication with Friends and Family: More than three times a week    Frequency of Social Gatherings with Friends and Family: More than three times a week    Attends Religious Services: More than 4 times per year    Active Member of [employer] or Organizations: Yes    Attends Archivist Meetings: More  than 4 times per year    Marital Status: Divorced     Family History:  The patient's  family history includes ADD / ADHD in an other family member; Asthma in her son; COPD in her sister; Cancer in her brother, father, and sister; Cancer (age of onset: 5) in her paternal grandmother; Colon cancer in her paternal grandmother; Depression in her mother; Diabetes in her maternal grandmother; Esophageal cancer in her father; Heart disease in her father; Hyperlipidemia in her daughter; Hypertension in her sister; Hyperthyroidism in her brother; Liver cancer in her paternal grandmother; Osteoporosis in her sister; Schizophrenia in her mother.   ROS:   Please see the history of present illness.    ROS All other systems reviewed and are negative.   PHYSICAL EXAM:   VS:  BP 126/62   Pulse 65   Ht '5\' 4"'$  (1.626 m)   Wt 146 lb (66.2 kg)   SpO2 97%   BMI 25.06 kg/m   Physical Exam    Affect appropriate Healthy:  appears stated age 76: normal Neck supple with no adenopathy JVP normal no bruits no thyromegaly Lungs clear with no wheezing and good diaphragmatic motion Heart:  S1/S2 no murmur, no rub, gallop or click PMI normal Abdomen: benighn, BS positve, no tenderness, no AAA no bruit.  No HSM or HJR Distal pulses intact with no bruits No edema Neuro non-focal Skin warm and dry No muscular weakness   Wt Readings from Last 3 Encounters:  08/13/21 146 lb (66.2 kg)  08/12/21 146 lb (66.2 kg)  08/12/21 146 lb (66.2 kg)      Studies/Labs Reviewed:   EKG:    SR rate 56 poor R wave progression lateral T wave changes 05/21/21   Recent Labs: 11/20/2020: ALT 29; Magnesium 2.0 06/03/2021: BUN 11; Creatinine, Ser 0.65; Potassium 4.4; Sodium 142 08/12/2021: Hemoglobin 13.8; Platelets 176.0; TSH 1.33   Lipid Panel    Component Value Date/Time   CHOL 137 11/20/2020 1630   CHOL 162 01/11/2019 0815   TRIG 129.0 11/20/2020 1630   HDL 40.90 11/20/2020 1630   HDL 47 01/11/2019 0815   CHOLHDL 3 11/20/2020 1630   VLDL 25.8 11/20/2020 1630   LDLCALC 70 11/20/2020 1630   LDLCALC 85 01/11/2019 0815    Additional studies/ records that were reviewed today include:  Cath: 05/19/21   Conclusions: Severe two-vessel coronary artery disease with up to 90% stenoses involving the ostial through mid LAD and ostial through proximal ramus intermedius.  Mild, nonobstructive CAD noted in the LCx and RCA. Normal left ventricular systolic function (LVEF 67-67%) with normal filling pressure (LVEDP 10 mmHg) Successful IVUS-guided PCI to ostial through mid LAD using Onyx Frontier 2.75 x 38 mm drug-eluting stent with 0% residual stenosis and TIMI-3 flow Successful PCI to ostial through proximal ramus intermedius using Onyx Frontier 2.0 x 22 mm drug-eluting stent with 0% residual stenosis and TIMI-3 flow.   Recommendations: Overnight extended recovery. Dual antiplatelet therapy with  aspirin and clopidogrel, ideally for 6 months.  Nonemergent surgery will likely need to be delayed to allow for adequate antiplatelet therapy following two-vessel PCI. Aggressive secondary prevention.   Nelva Bush, MD [facility]   Diagnostic Dominance: Right Intervention   _____________   Risk Assessment/Calculations:         ASSESSMENT:    No diagnosis found.    PLAN:  In order of problems listed above:  CAD cardiac cath 05/19/21 with DES LAD and DES ramus. Plan for Plavix/ASA for at  least 6 months. Doing well without chest pain.    ILD-with chronic DOE  SVT/nocturnal bradycardia-no recent symptoms  HLD LDL 70 on lipitor update labs   OSA  Preoperative :  earliest consideration for surgery would be October after 6 months of DAT [facility] to have left ear surgery with Dr Marla Roe on DAT       Signed, [name], MD  08/13/2021 9:01 AM    [facility] [address], [place], [place]  [id number] Phone: [phone]; Fax: [phone]

## 2021-08-11 ENCOUNTER — Other Ambulatory Visit (HOSPITAL_COMMUNITY): Payer: Self-pay

## 2021-08-11 ENCOUNTER — Encounter (HOSPITAL_COMMUNITY)
Admission: RE | Admit: 2021-08-11 | Discharge: 2021-08-11 | Disposition: A | Discharge status: Home / Self Care | Payer: Medicare PPO | Source: Ambulatory Visit | Attending: Cardiovascular Disease | Admitting: Cardiovascular Disease

## 2021-08-11 DIAGNOSIS — Z955 Presence of coronary angioplasty implant and graft: Secondary | ICD-10-CM | POA: Diagnosis not present

## 2021-08-12 ENCOUNTER — Other Ambulatory Visit (HOSPITAL_COMMUNITY): Payer: Self-pay

## 2021-08-12 ENCOUNTER — Encounter: Payer: Self-pay | Admitting: Allergy and Immunology

## 2021-08-12 ENCOUNTER — Encounter: Payer: Self-pay | Admitting: Internal Medicine

## 2021-08-12 ENCOUNTER — Other Ambulatory Visit: Payer: Self-pay | Admitting: *Deleted

## 2021-08-12 ENCOUNTER — Ambulatory Visit: Payer: Medicare PPO | Admitting: Allergy and Immunology

## 2021-08-12 ENCOUNTER — Ambulatory Visit: Payer: Medicare PPO | Admitting: Internal Medicine

## 2021-08-12 ENCOUNTER — Other Ambulatory Visit: Payer: Self-pay | Admitting: Internal Medicine

## 2021-08-12 VITALS — BP 122/68 | HR 62 | Temp 97.9°F | Wt 146.0 lb

## 2021-08-12 VITALS — BP 110/62 | HR 52 | Temp 97.2°F | Resp 16 | Ht 64.5 in | Wt 146.0 lb

## 2021-08-12 DIAGNOSIS — I2510 Atherosclerotic heart disease of native coronary artery without angina pectoris: Secondary | ICD-10-CM | POA: Diagnosis not present

## 2021-08-12 DIAGNOSIS — E039 Hypothyroidism, unspecified: Secondary | ICD-10-CM

## 2021-08-12 DIAGNOSIS — D839 Common variable immunodeficiency, unspecified: Secondary | ICD-10-CM

## 2021-08-12 DIAGNOSIS — E559 Vitamin D deficiency, unspecified: Secondary | ICD-10-CM

## 2021-08-12 DIAGNOSIS — K52832 Lymphocytic colitis: Secondary | ICD-10-CM | POA: Diagnosis not present

## 2021-08-12 DIAGNOSIS — D801 Nonfamilial hypogammaglobulinemia: Secondary | ICD-10-CM

## 2021-08-12 DIAGNOSIS — L74519 Primary focal hyperhidrosis, unspecified: Secondary | ICD-10-CM

## 2021-08-12 DIAGNOSIS — K219 Gastro-esophageal reflux disease without esophagitis: Secondary | ICD-10-CM | POA: Diagnosis not present

## 2021-08-12 LAB — CBC WITH DIFFERENTIAL/PLATELET
Basophils Absolute: 0 10*3/uL (ref 0.0–0.1)
Basophils Relative: 0.7 % (ref 0.0–3.0)
Eosinophils Absolute: 0.1 10*3/uL (ref 0.0–0.7)
Eosinophils Relative: 1.4 % (ref 0.0–5.0)
HCT: 41.5 % (ref 36.0–46.0)
Hemoglobin: 13.8 g/dL (ref 12.0–15.0)
Lymphocytes Relative: 23.1 % (ref 12.0–46.0)
Lymphs Abs: 1.4 10*3/uL (ref 0.7–4.0)
MCHC: 33.3 g/dL (ref 30.0–36.0)
MCV: 97.7 fl (ref 78.0–100.0)
Monocytes Absolute: 0.6 10*3/uL (ref 0.1–1.0)
Monocytes Relative: 10.4 % (ref 3.0–12.0)
Neutro Abs: 3.9 10*3/uL (ref 1.4–7.7)
Neutrophils Relative %: 64.4 % (ref 43.0–77.0)
Platelets: 176 10*3/uL (ref 150.0–400.0)
RBC: 4.25 Mil/uL (ref 3.87–5.11)
RDW: 15.6 % — ABNORMAL HIGH (ref 11.5–15.5)
WBC: 6.1 10*3/uL (ref 4.0–10.5)

## 2021-08-12 LAB — TSH: TSH: 1.33 u[IU]/mL (ref 0.35–5.50)

## 2021-08-12 MED ORDER — ALBUTEROL SULFATE HFA 108 (90 BASE) MCG/ACT IN AERS
2.0000 | INHALATION_SPRAY | Freq: Four times a day (QID) | RESPIRATORY_TRACT | 1 refills | Status: DC | PRN
  Filled 2021-08-12: qty 18, 25d supply, fill #0

## 2021-08-12 MED ORDER — FAMOTIDINE 40 MG PO TABS
40.0000 mg | ORAL_TABLET | Freq: Every evening | ORAL | 2 refills | Status: DC
  Filled 2021-08-12 – 2021-10-09 (×6): qty 90, 90d supply, fill #0

## 2021-08-12 MED ORDER — LORATADINE 10 MG PO TABS
10.0000 mg | ORAL_TABLET | Freq: Every day | ORAL | 5 refills | Status: DC | PRN
  Filled 2021-08-12: qty 60, 30d supply, fill #0
  Filled 2021-10-09: qty 60, 30d supply, fill #1

## 2021-08-12 MED ORDER — ALBUTEROL SULFATE HFA 108 (90 BASE) MCG/ACT IN AERS: 2.0000 | INHALATION_SPRAY | Freq: Four times a day (QID) | RESPIRATORY_TRACT | 1 refills | Status: DC | PRN

## 2021-08-12 MED ORDER — IPRATROPIUM BROMIDE 0.03 % NA SOLN
1.0000 | Freq: Three times a day (TID) | NASAL | 5 refills | Status: DC
  Filled 2021-08-12: qty 30, 30d supply, fill #0
  Filled 2021-12-14: qty 30, 30d supply, fill #1

## 2021-08-12 MED ORDER — MONTELUKAST SODIUM 10 MG PO TABS
10.0000 mg | ORAL_TABLET | Freq: Every evening | ORAL | 1 refills | Status: DC
  Filled 2021-08-12 – 2021-10-09 (×3): qty 90, 90d supply, fill #0
  Filled 2022-01-06: qty 90, 90d supply, fill #1

## 2021-08-12 MED ORDER — VITAMIN D (ERGOCALCIFEROL) 1.25 MG (50000 UNIT) PO CAPS
50000.0000 [IU] | ORAL_CAPSULE | ORAL | 0 refills | Status: DC
  Filled 2021-08-12: qty 12, 84d supply, fill #0

## 2021-08-12 NOTE — Progress Notes (Signed)
Cardiac Individual Treatment Plan  Patient Details  Name: Beverly [name] MRN: [id number] Date of Birth: May 22, 1945 Referring Provider:   Flowsheet Row INTENSIVE CARDIAC REHAB ORIENT from 08/05/2021 in [place]  Referring Provider [name], MD       Initial Encounter Date:  [name] from 08/05/2021 in [place]  Date 08/05/21       Visit Diagnosis: 05/19/21 S/P DES x 2 LAD, Ramus  Patient's Home Medications on Admission:  Current Outpatient Medications:    Vitamin D, Ergocalciferol, (DRISDOL) 1.25 MG (50000 UNIT) CAPS capsule, Take 1 capsule (50,000 Units total) by mouth every 7 (seven) days for 12 doses., Disp: 12 capsule, Rfl: 0   acetaminophen (TYLENOL) 500 MG tablet, Take 1,000 mg by mouth every 6 (six) hours as needed for moderate pain. , Disp: , Rfl:    albuterol (VENTOLIN HFA) 108 (90 Base) MCG/ACT inhaler, Inhale 2 puffs into the lungs every 6 (six) hours as needed for wheezing or shortness of breath., Disp: 18 g, Rfl: 1   aspirin EC 81 MG tablet, Take 81 mg by mouth daily., Disp: , Rfl:    atorvastatin (LIPITOR) 40 MG tablet, Take 1 tablet by mouth daily., Disp: 90 tablet, Rfl: 1   budesonide (ENTOCORT EC) 3 MG 24 hr capsule, Take 3 capsules (9 mg total) by mouth daily for 28 days, THEN 2 capsules (6 mg total) daily for 56 days, THEN 1 capsule (3 mg total) daily., Disp: 252 capsule, Rfl: 0   Buprenorphine HCl (BELBUCA) 750 MCG FILM, Place 1 film to inside of mouth every twelve hours, Disp: 60 each, Rfl: 2   Calcium Carbonate-Vitamin D 600-200 MG-UNIT TABS, Take 1 tablet by mouth 2 (two) times daily., Disp: , Rfl:    celecoxib (CELEBREX) 200 MG capsule, TAKE 1 CAPSULE BY MOUTH 2 TIMES DAILY, Disp: 180 capsule, Rfl: 1   colestipol (COLESTID) 1 g tablet, Take 1 tablet by mouth daily., Disp: 30 tablet, Rfl: 2   diclofenac Sodium (VOLTAREN) 1 % GEL, Apply 2 g topically 4  (four) times daily as needed (joint pain)., Disp: , Rfl:    Docusate Sodium 100 MG capsule, Take 100 mg by mouth daily., Disp: , Rfl:    EPINEPHrine 0.3 mg/0.3 mL IJ SOAJ injection, Inject 0.3 mg into the muscle as needed for anaphylaxis., Disp: , Rfl:    famotidine (PEPCID) 40 MG tablet, TAKE 1 TABLET BY MOUTH AT BEDTIME., Disp: 90 tablet, Rfl: 2   ferrous sulfate 324 MG TBEC, Take 324 mg by mouth daily with breakfast., Disp: , Rfl:    Fluorouracil (TOLAK) 4 % CREA, Apply to aa total 28 applications & sun protect, Disp: 40 g, Rfl: 0   gabapentin (NEURONTIN) 800 MG tablet, Take 1 tablet by mouth at bedtime., Disp: 90 tablet, Rfl: 2   guaiFENesin (MUCINEX) 600 MG 12 hr tablet, Take 600 mg by mouth 2 (two) times daily as needed for cough or to loosen phlegm., Disp: , Rfl:    HYDROcodone-acetaminophen (NORCO) 7.5-325 MG tablet, Take 1 tablet by mouth four times a day as needed for pain, Disp: 100 tablet, Rfl: 0   ipratropium (ATROVENT) 0.03 % nasal spray, Place 1 spray into both nostrils 3 times daily., Disp: 30 mL, Rfl: 12   Lactobacillus-Inulin (CULTURELLE ADULT ULT BALANCE) CAPS, Take 1 capsule by mouth daily., Disp: , Rfl:    levothyroxine (SYNTHROID) 25 MCG tablet, Take 1 tablet (25 mcg total)  by mouth daily., Disp: 90 tablet, Rfl: 1   losartan (COZAAR) 25 MG tablet, Take 1/2 tablet (12.5 mg total) by mouth daily., Disp: 90 tablet, Rfl: 0   Melatonin-Pyridoxine (MELATIN PO), Take 10 mg by mouth at bedtime., Disp: , Rfl:    methocarbamol (ROBAXIN) 500 MG tablet, Take 1 tablet by mouth three times a day as needed, Disp: 270 tablet, Rfl: 2   Misc Natural Products (FIBER 7 PO), Take 2 capsules by mouth daily., Disp: , Rfl:    montelukast (SINGULAIR) 10 MG tablet, TAKE 1 TABLET BY MOUTH AT BEDTIME., Disp: 90 tablet, Rfl: 2   Multiple Vitamins-Minerals (MULTIVITAMIN WITH MINERALS) tablet, Take 1 tablet by mouth daily., Disp: , Rfl:    nitroGLYCERIN (NITROSTAT) 0.4 MG SL tablet, Place 1 tablet (0.4 mg  total) under the tongue every 5 (five) minutes as needed., Disp: 25 tablet, Rfl: 2   omega-3 acid ethyl esters (LOVAZA) 1 g capsule, TAKE 2 CAPSULES BY MOUTH 2 TIMES DAILY, Disp: 360 capsule, Rfl: 0   ondansetron (ZOFRAN-ODT) 4 MG disintegrating tablet, Take 1 tablet by mouth every 6 (six) hours as needed for nausea or vomiting., Disp: 30 tablet, Rfl: 1   pantoprazole (PROTONIX) 40 MG tablet, Take 1 tablet (40 mg total) by mouth daily with breakfast., Disp: 90 tablet, Rfl: 3   PARoxetine (PAXIL) 30 MG tablet, Take 1 tablet by mouth once a day., Disp: 90 tablet, Rfl: 1   ticagrelor (BRILINTA) 90 MG TABS tablet, Take 1 tablet by mouth 2 times daily. 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   right sholder   BCC (basal cell carcinoma of skin) 05/09/2014   mid lower back   BCC (basal cell carcinoma of skin) 05/03/2017   right low back   BCC (basal cell carcinoma of skin) 05/03/2017   left upper back   BCC (basal cell carcinoma of skin) 07/05/2018   left mid back   BCC (basal cell carcinoma of skin) 05/20/1992   upper back   BCC (basal cell carcinoma of skin) 07/29/1993   left sholder medial   BCC (basal cell carcinoma of skin) 07/29/1993   left sholder lateral   BCC (basal cell carcinoma of skin) 07/29/1993   right thigh   BCC (basal cell carcinoma of skin) 07/29/1993   right sholder   BCC (basal cell carcinoma of skin) 12/22/1994   right mid forearm   BCC (basal cell carcinoma of skin) 12/22/1994   right upper forearm   BCC (basal cell carcinoma of skin) 12/22/1994   lower right  upper arm   BCC (basal cell carcinoma of skin) 12/22/1994   right upper arm sholder   BCC (basal cell carcinoma of skin) 08/11/1995   left leg below knee   BCC (basal cell carcinoma of skin) 04/11/2002   mid back   BCC (basal cell carcinoma of skin) 12/10/2002   right center upper back   BCC (basal cell carcinoma of skin) 05/26/2005   right post sholder   BCC (  basal cell carcinoma) 05/09/2014   left inner shin   BCC (basal cell carcinoma) 06/12/2014   left forearm   Bowen's disease 10/07/1994   right post knee, right inner forearm/wrist   Bowen's disease 08/11/1995   left sholder   Bronchiectasis ([place])    Cataract    left   Chronic pain    Common variable immunodeficiency [facility])    sees dr Gayleen Orem 02-11-2021 epic   Coronary artery disease    Depression    hx of   Diverticulosis of colon 1998   mild   DJD (degenerative joint disease)    Duodenal ulcer    h/o yrs ago   GERD (gastroesophageal reflux disease)    History of SCC (squamous cell carcinoma) of skin    Dr. Denna Haggard   History of sinus bradycardia    HLD (hyperlipidemia)    hypertriglyceridemia   Hypertensive retinopathy of both eyes 11/18/2017   Hypothyroidism    IBS (irritable bowel syndrome)    Internal hemorrhoids 1998   Lymphocytic colitis    Mitral regurgitation    mild   Nocardiosis    relased by infection disease dec 2022   Osteoarthritis    feet,shoulder,neck,back,hips and hands.   Pneumonia 2015   PONV (postoperative nausea and vomiting)    Rotator cuff tear, right 02/2019   infraspinatus and supraspinatus, and dislocation of long head of bicep tendon (Dr. Alvan Dame)   SCC (squamous cell carcinoma) 11/26/2014   left hand, right hand, right deltoid mnay areas   SCC (squamous cell carcinoma) 05/03/2017   left cheek   Sleep apnea    uses a mouth guard nightly   Squamous cell carcinoma in situ (SCCIS) 07/05/2018   left hand   Tracheobronchomalacia    Vitamin D deficiency    mild    Tobacco  Use: Social History   Tobacco Use  Smoking Status Never  Smokeless Tobacco Never    Labs: Review Flowsheet  More data exists      Latest Ref Rng & Units 06/10/2017 07/12/2018 01/11/2019 06/22/2019  Labs for ITP Cardiac and Pulmonary Rehab  Cholestrol 0 - 200 mg/dL 146  145  162  161   LDL (calc) 0 - 99 mg/dL 72  61  85  73   HDL-C >39.00 mg/dL 47  36  47  49   Trlycerides 0.0 - 149.0 mg/dL 135  238  173  197       11/20/2020  Labs for ITP Cardiac and Pulmonary Rehab  Cholestrol 137   LDL (calc) 70   HDL-C 40.90   Trlycerides 129.0     Capillary Blood Glucose: No results found for: "GLUCAP"   Exercise Target Goals: Exercise Program Goal: Individual exercise prescription set using results from initial 6 min walk test and THRR while considering  patient's activity barriers and safety.   Exercise Prescription Goal: Starting with aerobic activity 30 plus minutes a day, 3 days per week for initial exercise prescription. Provide home exercise prescription and guidelines that participant acknowledges understanding prior to discharge.  Activity Barriers & Risk Stratification:  Activity Barriers & Cardiac Risk Stratification - 08/05/21 1413       Activity Barriers & Cardiac Risk Stratification   Activity Barriers Back Problems;Arthritis;Joint Problems;Neck/Spine Problems;Muscular Weakness;Balance Concerns    Cardiac Risk Stratification High             6 Minute Walk:  6 Minute Walk     Row Name 08/05/21 1412  6 Minute Walk   Phase Initial     Distance 1211 feet     Walk Time 6 minutes     # of Rest Breaks 0     MPH 2.3     METS 2.53     RPE 11     Perceived Dyspnea  0     VO2 Peak 8.85     Symptoms No     Resting HR 70 bpm     Resting BP 114/60     Resting Oxygen Saturation  98 %     Exercise Oxygen Saturation  during 6 min walk 98 %     Max Ex. HR 75 bpm     Max Ex. BP 142/64     2 Minute Post BP 130/64              Oxygen Initial  Assessment:   Oxygen Re-Evaluation:   Oxygen Discharge (Final Oxygen Re-Evaluation):   Initial Exercise Prescription:  Initial Exercise Prescription - 08/05/21 1400       Date of Initial Exercise RX and Referring Provider   Date 08/05/21    Referring Provider [name], MD    Expected Discharge Date 10/03/21      NuStep   Level 2    SPM 75    Minutes 30    METs 2.5      Prescription Details   Frequency (times per week) 3    Duration Progress to 30 minutes of continuous aerobic without signs/symptoms of physical distress      Intensity   THRR 40-80% of Max Heartrate 59-118    Ratings of Perceived Exertion 11-13    Perceived Dyspnea 0-4      Progression   Progression Continue progressive overload as per policy without signs/symptoms or physical distress.      Resistance Training   Training Prescription Yes    Weight 2 lbs    Reps 10-15             Perform Capillary Blood Glucose checks as needed.  Exercise Prescription Changes:   Exercise Prescription Changes     Row Name 08/11/21 1300             Response to Exercise   Blood Pressure (Admit) 124/60       Blood Pressure (Exercise) 128/66       Blood Pressure (Exit) 102/60       Heart Rate (Admit) 67 bpm       Heart Rate (Exercise) 72 bpm       Heart Rate (Exit) 57 bpm       Rating of Perceived Exertion (Exercise) 12       Symptoms None       Comments Pt's first day in the CRP2 program       Duration Continue with 30 min of aerobic exercise without signs/symptoms of physical distress.       Intensity THRR unchanged         Progression   Progression Continue to progress workloads to maintain intensity without signs/symptoms of physical distress.       Average METs 2.3         Resistance Training   Training Prescription Yes       Weight 2 lbs       Reps 10-15       Time 10 Minutes         Interval Training   Interval Training No  NuStep   Level 2       SPM 79       Minutes  30       METs 2.3                Exercise Comments:   Exercise Comments     Row Name 08/11/21 1403           Exercise Comments Pt's first day in the CRP2 program. Pt had no complaints with todays session and is off to a good start.                Exercise Goals and Review:   Exercise Goals     Row Name 08/05/21 1417             Exercise Goals   Increase Physical Activity Yes       Intervention Provide advice, education, support and counseling about physical activity/exercise needs.;Develop an individualized exercise prescription for aerobic and resistive training based on initial evaluation findings, risk stratification, comorbidities and participant's personal goals.       Expected Outcomes Short Term: Attend rehab on a regular basis to increase amount of physical activity.;Long Term: Add in home exercise to make exercise part of routine and to increase amount of physical activity.;Long Term: Exercising regularly at least 3-5 days a week.       Increase Strength and Stamina Yes       Intervention Provide advice, education, support and counseling about physical activity/exercise needs.;Develop an individualized exercise prescription for aerobic and resistive training based on initial evaluation findings, risk stratification, comorbidities and participant's personal goals.       Expected Outcomes Short Term: Increase workloads from initial exercise prescription for resistance, speed, and METs.;Short Term: Perform resistance training exercises routinely during rehab and add in resistance training at home;Long Term: Improve cardiorespiratory fitness, muscular endurance and strength as measured by increased METs and functional capacity (6MWT)       Able to understand and use rate of perceived exertion (RPE) scale Yes       Intervention Provide education and explanation on how to use RPE scale       Expected Outcomes Short Term: Able to use RPE daily in rehab to express  subjective intensity level;Long Term:  Able to use RPE to guide intensity level when exercising independently       Knowledge and understanding of Target Heart Rate Range (THRR) Yes       Intervention Provide education and explanation of THRR including how the numbers were predicted and where they are located for reference       Expected Outcomes Short Term: Able to state/look up THRR;Short Term: Able to use daily as guideline for intensity in rehab;Long Term: Able to use THRR to govern intensity when exercising independently       Understanding of Exercise Prescription Yes       Intervention Provide education, explanation, and written materials on patient's individual exercise prescription       Expected Outcomes Short Term: Able to explain program exercise prescription;Long Term: Able to explain home exercise prescription to exercise independently                Exercise Goals Re-Evaluation :  Exercise Goals Re-Evaluation     Row Name 08/11/21 1402             Exercise Goal Re-Evaluation   Exercise Goals Review Increase Physical Activity;Increase Strength and Stamina;Able to understand and use rate of  perceived exertion (RPE) scale;Knowledge and understanding of Target Heart Rate Range (THRR);Understanding of Exercise Prescription       Comments Pt's first day in the CRP2 program. Pt understands the exercise RX, RPE scale and THRR.       Expected Outcomes Will continue to monitor patient and progress exercise workloads as tolerated.                 Discharge Exercise Prescription (Final Exercise Prescription Changes):  Exercise Prescription Changes - 08/11/21 1300       Response to Exercise   Blood Pressure (Admit) 124/60    Blood Pressure (Exercise) 128/66    Blood Pressure (Exit) 102/60    Heart Rate (Admit) 67 bpm    Heart Rate (Exercise) 72 bpm    Heart Rate (Exit) 57 bpm    Rating of Perceived Exertion (Exercise) 12    Symptoms None    Comments Pt's first day in  the CRP2 program    Duration Continue with 30 min of aerobic exercise without signs/symptoms of physical distress.    Intensity THRR unchanged      Progression   Progression Continue to progress workloads to maintain intensity without signs/symptoms of physical distress.    Average METs 2.3      Resistance Training   Training Prescription Yes    Weight 2 lbs    Reps 10-15    Time 10 Minutes      Interval Training   Interval Training No      NuStep   Level 2    SPM 79    Minutes 30    METs 2.3             Nutrition:  Target Goals: Understanding of nutrition guidelines, daily intake of sodium '1500mg'$ , cholesterol '200mg'$ , calories 30% from fat and 7% or less from saturated fats, daily to have 5 or more servings of fruits and vegetables.  Biometrics:  Pre Biometrics - 08/05/21 1135       Pre Biometrics   Waist Circumference 36.25 inches    Hip Circumference 39.25 inches    Waist to Hip Ratio 0.92 %    Triceps Skinfold 20 mm    % Body Fat 36.3 %    Grip Strength 25 kg    Flexibility --   Not done   Single Leg Stand 10.56 seconds              Nutrition Therapy Plan and Nutrition Goals:  Nutrition Therapy & Goals - 08/11/21 0945       Nutrition Therapy   Diet Heart Healthy Diet    Drug/Food Interactions Statins/Certain Fruits      Personal Nutrition Goals   Nutrition Goal Patient to choose a variety of fruits, vegetables, lean protein/plant protein, whole grains, and nonfat dairy daily as part of heart healthy diet.    Personal Goal #2 Patient to limit to '1500mg'$  of sodium daily    Personal Goal #3 Patient to identify and limit daily intake of saturated fat, trans fat, sodium, and refined carbohydrates.    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      Intervention Plan   Intervention Prescribe, educate and counsel regarding individualized specific dietary modifications aiming towards targeted core components  such as weight, hypertension, lipid management, diabetes, heart failure and other comorbidities.    Expected Outcomes Short Term Goal: Understand basic principles of dietary content, such as calories, fat, sodium, cholesterol and  nutrients.;Long Term Goal: Adherence to prescribed nutrition plan.             Nutrition Assessments:  MEDIFICTS Score Key: ?70 Need to make dietary changes  40-70 Heart Healthy Diet ? 40 Therapeutic Level Cholesterol Diet   Picture Your Plate Scores: <98 Unhealthy dietary pattern with much room for improvement. 41-50 Dietary pattern unlikely to meet recommendations for good health and room for improvement. 51-60 More healthful dietary pattern, with some room for improvement.  >60 Healthy dietary pattern, although there may be some specific behaviors that could be improved.    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 Labs WNL and normal weight BMI.                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 Labs WNL and normal weight BMI.             Psychosocial: Target Goals: Acknowledge presence or absence of significant depression and/or stress, maximize coping skills, provide positive support system. Participant is able to verbalize types and ability to use techniques and skills needed for reducing stress and depression.  Initial Review & Psychosocial Screening:  Initial Psych Review & Screening - 08/05/21 1525       Initial Review   Current issues with History of Depression;Current Stress Concerns     Source of Stress Concerns Chronic Illness    Comments [place] needs back surgery which will be deferred due to her recent cardiac stenting.      Family Dynamics   Good Support System? Yes   Liadan lives alone. Chaelyn has friends in the area and two children one who lives in [place] the other lives in [place] has a history of depression. Tessia's depression is currently controlled on an antidepressant.      Barriers   Psychosocial barriers to participate in program The patient should benefit from training in stress management and relaxation.      Screening Interventions   Interventions Encouraged to exercise             Quality of Life Scores:  Quality of Life - 08/05/21 1406       Quality of Life   Select Quality of Life      Quality of Life Scores   Health/Function Pre 26.46 %    Socioeconomic Pre 30 %    Psych/Spiritual Pre 29.64 %    Family Pre 30 %    GLOBAL Pre 28.32 %            Scores of 19 and below usually indicate a poorer quality of life in these areas.  A difference of  2-3 points is a clinically meaningful difference.  A difference of 2-3 points in the total score of the Quality of Life Index has been associated with significant improvement in overall quality of life, self-image, physical symptoms, and general health in studies assessing change in quality of life.  PHQ-9: Review Flowsheet  More data exists      08/05/2021 12/10/2020 11/20/2020 08/06/2020 02/20/2020  Depression screen PHQ 2/9  Decreased Interest 0 0 0 0 0  Down, Depressed, Hopeless 0 0 0 0 0  PHQ - 2 Score 0 0 0 0 0  Altered sleeping - - 0 - -  Tired, decreased energy - - 0 - -  Change in appetite - - 0 - -  Feeling bad or failure about yourself  - - 0 - -  Trouble concentrating - - 0 - -  Moving slowly or fidgety/restless - - 0 - -  Suicidal thoughts - - 0 - -  PHQ-9 Score - - 0 - -   Interpretation of Total Score  Total Score Depression Severity:   1-4 = Minimal depression, 5-9 = Mild depression, 10-14 = Moderate depression, 15-19 = Moderately severe depression, 20-27 = Severe depression   Psychosocial Evaluation and Intervention:   Psychosocial Re-Evaluation:  Psychosocial Re-Evaluation     Row Name 08/12/21 [phone]             Psychosocial Re-Evaluation   Current issues with History of Depression;Current Stress Concerns       Comments Kalayna did not voice any increased concerns or stressors on her first day of exercise       Expected Outcomes Marilouise will have decreased stress and controlled depression upon completion of phase 2 cardiac rehab       Interventions Stress management education;Relaxation education;Encouraged to attend Cardiac Rehabilitation for the exercise       Continue Psychosocial Services  No Follow up required         Initial Review   Source of Stress Concerns Chronic Illness       Comments Will continue to montior and offer support as needed                Psychosocial Discharge (Final Psychosocial Re-Evaluation):  Psychosocial Re-Evaluation - 08/12/21 0858       Psychosocial Re-Evaluation   Current issues with History of Depression;Current Stress Concerns    Comments Reiley did not voice any increased concerns or stressors on her first day of exercise    Expected Outcomes Allante will have decreased stress and controlled depression upon completion of phase 2 cardiac rehab    Interventions Stress management education;Relaxation education;Encouraged to attend Cardiac Rehabilitation for the exercise    Continue Psychosocial Services  No Follow up required      Initial Review   Source of Stress Concerns Chronic Illness    Comments Will continue to montior and offer support as needed             Vocational Rehabilitation: Provide vocational rehab assistance to qualifying candidates.   Vocational Rehab Evaluation & Intervention:  Vocational Rehab - 08/05/21 1531       Initial Vocational  Rehab Evaluation & Intervention   Assessment shows need for Vocational Rehabilitation No   Talena is retired and does not need vocational rehab at this time            Education: Education Goals: Education classes will be provided on a weekly basis, covering required topics. Participant will state understanding/return demonstration of topics presented.  Learning Barriers/Preferences:  Learning Barriers/Preferences - 08/05/21 1408       Learning Barriers/Preferences   Learning Barriers Sight   wears glasses   Learning Preferences Computer/Internet;Skilled Demonstration;Video;Written Material;Group Instruction;Individual Instruction;Pictoral  Education Topics: Hypertension, Hypertension Reduction -Define heart disease and high blood pressure. Discus how high blood pressure affects the body and ways to reduce high blood pressure.   Exercise and Your Heart -Discuss why it is important to exercise, the FITT principles of exercise, normal and abnormal responses to exercise, and how to exercise safely.   Angina -Discuss definition of angina, causes of angina, treatment of angina, and how to decrease risk of having angina.   Cardiac Medications -Review what the following cardiac medications are used for, how they affect the body, and side effects that may occur when taking the medications.  Medications include Aspirin, Beta blockers, calcium channel blockers, ACE Inhibitors, angiotensin receptor blockers, diuretics, digoxin, and antihyperlipidemics.   Congestive Heart Failure -Discuss the definition of CHF, how to live with CHF, the signs and symptoms of CHF, and how keep track of weight and sodium intake.   Heart Disease and Intimacy -Discus the effect sexual activity has on the heart, how changes occur during intimacy as we age, and safety during sexual activity.   Smoking Cessation / COPD -Discuss different methods to quit smoking, the health benefits of  quitting smoking, and the definition of COPD.   Nutrition I: Fats -Discuss the types of cholesterol, what cholesterol does to the heart, and how cholesterol levels can be controlled.   Nutrition II: Labels -Discuss the different components of food labels and how to read food label   Heart Parts/Heart Disease and PAD -Discuss the anatomy of the heart, the pathway of blood circulation through the heart, and these are affected by heart disease.   Stress I: Signs and Symptoms -Discuss the causes of stress, how stress may lead to anxiety and depression, and ways to limit stress.   Stress II: Relaxation -Discuss different types of relaxation techniques to limit stress.   Warning Signs of Stroke / TIA -Discuss definition of a stroke, what the signs and symptoms are of a stroke, and how to identify when someone is having stroke.   Knowledge Questionnaire Score:  Knowledge Questionnaire Score - 08/05/21 1409       Knowledge Questionnaire Score   Pre Score 19/24             Core Components/Risk Factors/Patient Goals at Admission:  Personal Goals and Risk Factors at Admission - 08/05/21 1411       Core Components/Risk Factors/Patient Goals on Admission    Weight Management Weight Maintenance    Lipids Yes    Intervention Provide education and support for participant on nutrition & aerobic/resistive exercise along with prescribed medications to achieve LDL '70mg'$ , HDL >'40mg'$ .    Expected Outcomes Short Term: Participant states understanding of desired cholesterol values and is compliant with medications prescribed. Participant is following exercise prescription and nutrition guidelines.;Long Term: Cholesterol controlled with medications as prescribed, with individualized exercise RX and with personalized nutrition plan. Value goals: LDL < '70mg'$ , HDL > 40 mg.             Core Components/Risk Factors/Patient Goals Review:   Goals and Risk Factor Review     Row Name 08/12/21  0903             Core Components/Risk Factors/Patient Goals Review   Personal Goals Review Weight Management/Obesity;Lipids       Review Nataline started cardiac rehab on 08/11/21. Quintina did well with exercise. Vital signs were stable.       Expected Outcomes Anntonette will continue to participate inphase 2 cardiac rehab for exercise, nutrition  and lifestyel modifications                Core Components/Risk Factors/Patient Goals at Discharge (Final Review):   Goals and Risk Factor Review - 08/12/21 0903       Core Components/Risk Factors/Patient Goals Review   Personal Goals Review Weight Management/Obesity;Lipids    Review Adalynne started cardiac rehab on 08/11/21. Reegan did well with exercise. Vital signs were stable.    Expected Outcomes Denean will continue to participate inphase 2 cardiac rehab for exercise, nutrition and lifestyel modifications             ITP Comments:  ITP Comments     Row Name 08/05/21 1218 08/12/21 0846         ITP Comments Dr Fransico Him MD, Medical Director, Introduction to [facility]/ Intensive Cardiac Rehab Program. Initial [facility] Orientation Packet reviewed with the patient. 30 Day ITP Review. Denis started Intensive Cardiac Rehab on 08/11/21. Glynis did well with exercise.               Comments: See ITP comments.Harrell Gave RN BSN

## 2021-08-12 NOTE — Progress Notes (Signed)
[facility] [place] [facility] [place]   Follow-up Note  Referring Provider: Isaac [name], Beverly [name]* Primary Provider: Isaac [name], Beverly Halsted, MD Date of Office Visit: 08/12/2021  Subjective:   Beverly [name] (DOB: [birth year]) is a 76 y.o. 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  I last saw her in this clinic on 11 February 2021.  She has not had any infections since her last visit.  She has no significant respiratory tract symptoms since her last visit.  She has not required a systemic steroid or an antibiotic since her last visit.  She is no longer on any inhaled controller agents.  She has discontinued her prolonged antibiotic therapy for nocardia pneumonia.  She continues to perform weekly subcutaneous immunoglobulin infusions without any adverse effect.  Her reflux is under very good control.  She is currently using a combination of Protonix and famotidine.  Her lymphocytic colitis is active.  She will be restarting her oral budesonide as directed by GI.  She did think that she had her lymphocytic colitis under good control while using colestipol and she slowly taper down to just a few times per week with her colestipol and then she had a flare of her diarrhea and it did not respond to daily use of colestipol.  She had placement of coronary stents April 2023 and is now on Brilinta.  She relates a 5-year history of having intermittent unilateral sweating of her face and head affecting the right side that will last 15 minutes and appears to occur on a daily basis.  She does not know if she has any eye changes  during these events.  She has no other associated neurological symptoms with these events.  Allergies as of 08/12/2021       Reactions   Plavix [clopidogrel] Rash   Adhesive [tape] Rash   Codeine Rash        Medication List    acetaminophen 500 MG tablet Commonly known as: TYLENOL Take 1,000 mg by mouth every 6 (six) hours as needed for moderate pain.   albuterol 108 (90 Base) MCG/ACT inhaler Commonly known as: Ventolin HFA Inhale 2 puffs into the lungs every 6 (six) hours as needed for wheezing or shortness of breath.   aspirin EC 81 MG tablet Take 81 mg by mouth daily.   atorvastatin 40 MG tablet Commonly known as: LIPITOR Take 1 tablet by mouth daily.   Belbuca 750 MCG Film Generic drug: Buprenorphine HCl Place 1 film to inside of mouth every twelve hours   Brilinta 90 MG Tabs tablet Generic drug: ticagrelor Take 1 tablet by mouth 2 times daily. (stop Plavix (clopidogrel))   budesonide 3 MG 24 hr capsule Commonly known as: ENTOCORT EC Take 3 capsules (9 mg total) by mouth daily for 28 days, THEN 2 capsules (6 mg total) daily for 56 days, THEN 1 capsule (3 mg total) daily. Start taking on: August 05, 2021   Calcium Carbonate-Vitamin D 600-200 MG-UNIT Tabs Take 1 tablet by mouth 2 (two) times daily.   celecoxib 200 MG capsule Commonly known as: CELEBREX TAKE 1 CAPSULE BY MOUTH 2 TIMES DAILY   colestipol 1 g tablet Commonly known as:  COLESTID Take 1 tablet by mouth daily.   Culturelle Adult Ult Balance Caps Take 1 capsule by mouth daily.   diclofenac Sodium 1 % Gel Commonly known as: VOLTAREN Apply 2 g topically 4 (four) times daily as needed (joint pain).   Docusate Sodium 100 MG capsule Take 100 mg by mouth daily.   EPINEPHrine 0.3 mg/0.3 mL Soaj injection Commonly known as: EPI-PEN Inject 0.3 mg into the muscle as needed for anaphylaxis.   famotidine 40 MG tablet Commonly known as: PEPCID TAKE 1 TABLET BY MOUTH AT BEDTIME.   ferrous sulfate 324  MG Tbec Take 324 mg by mouth daily with breakfast.   FIBER 7 PO Take 2 capsules by mouth daily.   gabapentin 800 MG tablet Commonly known as: NEURONTIN Take 1 tablet by mouth at bedtime.   guaiFENesin 600 MG 12 hr tablet Commonly known as: MUCINEX Take 600 mg by mouth 2 (two) times daily as needed for cough or to loosen phlegm.   HYDROcodone-acetaminophen 7.5-325 MG tablet Commonly known as: NORCO Take 1 tablet by mouth four times a day as needed for pain   ipratropium 0.03 % nasal spray Commonly known as: ATROVENT Place 1 spray into both nostrils 3 times daily.   levothyroxine 25 MCG tablet Commonly known as: SYNTHROID Take 1 tablet (25 mcg total) by mouth daily.   losartan 25 MG tablet Commonly known as: Cozaar Take 1/2 tablet (12.5 mg total) by mouth daily.   MELATIN PO Take 10 mg by mouth at bedtime.   methocarbamol 500 MG tablet Commonly known as: ROBAXIN Take 1 tablet by mouth three times a day as needed   montelukast 10 MG tablet Commonly known as: SINGULAIR TAKE 1 TABLET BY MOUTH AT BEDTIME.   multivitamin with minerals tablet Take 1 tablet by mouth daily.   nitroGLYCERIN 0.4 MG SL tablet Commonly known as: Nitrostat Place 1 tablet (0.4 mg total) under the tongue every 5 (five) minutes as needed.   omega-3 acid ethyl esters 1 g capsule Commonly known as: LOVAZA TAKE 2 CAPSULES BY MOUTH 2 TIMES DAILY   ondansetron 4 MG disintegrating tablet Commonly known as: ZOFRAN-ODT Take 1 tablet by mouth every 6 (six) hours as needed for nausea or vomiting.   pantoprazole 40 MG tablet Commonly known as: PROTONIX Take 1 tablet (40 mg total) by mouth daily with breakfast.   PARoxetine 30 MG tablet Commonly known as: PAXIL Take 1 tablet by mouth once a day.   Tolak 4 % Crea Generic drug: Fluorouracil Apply to aa total 28 applications & sun protect   Vitamin D (Ergocalciferol) 1.25 MG (50000 UNIT) Caps capsule Commonly known as: DRISDOL Take 1 capsule  (50,000 Units total) by mouth every 7 (seven) days for 12 doses. Started by: Beverly Frohlich, MD   Xembify 10 GM/50ML Soln Generic drug: Immune Globulin (Human)-klhw Inject 10 g into the skin once a week.    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   right sholder   BCC (basal cell carcinoma of skin) 05/09/2014   mid lower back   BCC (basal cell carcinoma of skin) 05/03/2017   right low back   BCC (basal cell carcinoma of  skin) 05/03/2017   left upper back   BCC (basal cell carcinoma of skin) 07/05/2018   left mid back   BCC (basal cell carcinoma of skin) 05/20/1992   upper back   BCC (basal cell carcinoma of skin) 07/29/1993   left sholder medial   BCC (basal cell carcinoma of skin) 07/29/1993   left sholder lateral   BCC (basal cell carcinoma of skin) 07/29/1993   right thigh   BCC (basal cell carcinoma of skin) 07/29/1993   right sholder   BCC (basal cell carcinoma of skin) 12/22/1994   right mid forearm   BCC (basal cell carcinoma of skin) 12/22/1994   right upper forearm   BCC (basal cell carcinoma of skin) 12/22/1994   lower right upper arm   BCC (basal cell carcinoma of skin) 12/22/1994   right upper arm sholder   BCC (basal cell carcinoma of skin) 08/11/1995   left leg below knee   BCC (basal cell carcinoma of skin) 04/11/2002   mid back   BCC (basal cell carcinoma of skin) 12/10/2002   right center upper back   BCC (basal cell carcinoma of skin) 05/26/2005   right post sholder   BCC (basal cell carcinoma) 05/09/2014   left inner shin   BCC (basal cell carcinoma) 06/12/2014   left forearm   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 Denna Haggard   History of sinus bradycardia    HLD (hyperlipidemia)    hypertriglyceridemia   Hypertensive retinopathy of both eyes 11/18/2017   Hypothyroidism    IBS (irritable bowel syndrome)    Internal hemorrhoids 1998   Lymphocytic colitis    Mitral regurgitation    mild   Nocardiosis    relased by infection disease dec 2022   Osteoarthritis    feet,shoulder,neck,back,hips and hands.   Pneumonia 2015   PONV (postoperative nausea and vomiting)    Rotator cuff tear, right 02/2019   infraspinatus and supraspinatus, and dislocation of long head of bicep tendon (Dr. Alvan Dame)   SCC (squamous cell carcinoma) 11/26/2014   left hand, right hand, right deltoid mnay areas   SCC (squamous cell carcinoma) 05/03/2017   left cheek   Sleep apnea    uses a mouth guard nightly   Squamous cell carcinoma in situ (SCCIS) 07/05/2018   left hand   Tracheobronchomalacia    Vitamin D deficiency    mild    Past Surgical History:  Procedure Laterality Date   ABDOMINAL HYSTERECTOMY  1998   complete   BLEPHAROPLASTY Bilateral 01/2018   BROW LIFT Bilateral 10/10/2018   Procedure: BROW LIFT;  Surgeon: Wallace Going, DO;  Location: [place];  Service: Plastics;  Laterality: Bilateral;   BUNIONECTOMY     R 12/08, L 2004 (Dr. Janus Molder)   [place]   bilateral  CARPAL TUNNEL RELEASE Left 03/06/2021   Procedure: Left Revision Carpal Tunnel Release with hypothenar fat pad flap;  Surgeon: [name], MD;  Location: [facility];  Service: Orthopedics;  Laterality: Left;  with local anesthesia   CATARACT EXTRACTION Bilateral Left in 08/2012, Right 04/2016   Dr.Hecker   CESAREAN SECTION     1981 and [place]   laparoscopic   CLOSED REDUCTION NASAL FRACTURE N/A 08/29/2018   Procedure: CLOSED REDUCTION NASAL FRACTURE;  Surgeon: Wallace Going, DO;  Location: [place];  Service: Plastics;  Laterality: N/A;  1 hour, please   COLONOSCOPY  2006   COLONOSCOPY  01/2012   due again 01/2021; mild diverticulosis   CORONARY STENT INTERVENTION N/A 05/19/2021   Procedure: CORONARY STENT INTERVENTION;  Surgeon: Nelva Bush, MD;  Location: [place] [facility];  Service: Cardiovascular;  Laterality: N/A;   epidural steroid injection, back  02/2010   HIP SURGERY     right bursectomy x 3   HIP SURGERY Right 2000   torn cartilage, repaired   INGUINAL HERNIA REPAIR  10/2007   bilat   INTRAVASCULAR ULTRASOUND/IVUS N/A 05/19/2021   Procedure: Intravascular Ultrasound/IVUS;  Surgeon: Nelva Bush, MD;  Location: [place] [facility];  Service: Cardiovascular;  Laterality: N/A;   LEFT HEART CATH AND CORONARY ANGIOGRAPHY N/A 05/19/2021   Procedure: LEFT HEART CATH AND CORONARY ANGIOGRAPHY;  Surgeon: Nelva Bush, MD;  Location: [place] [facility];  Service: Cardiovascular;  Laterality: N/A;   NECK SURGERY  1989   c6-7 cervical laminectomy and diskecotmy   REVERSE SHOULDER ARTHROPLASTY Right 04/11/2019   Procedure: REVERSE SHOULDER ARTHROPLASTY;  Surgeon: Nicholes Stairs, MD;  Location: [place];  Service: Orthopedics;  Laterality: Right;  Regional Block   SHOULDER SURGERY Left 03/2003   left rotator cuff repair   SHOULDER SURGERY Left 03/14/2018   rotator cuff repair; Dr. Tonita Cong   TONSILLECTOMY  age 69   TROCHANTERIC [place] EXTRACTION     as teenager    Review of systems negative except as noted in HPI /  PMHx or noted below:  Review of Systems  Constitutional: Negative.   HENT: Negative.    Eyes: Negative.   Respiratory: Negative.    Cardiovascular: Negative.   Gastrointestinal: Negative.   Genitourinary: Negative.   Musculoskeletal: Negative.   Skin: Negative.   Neurological: Negative.   Endo/Heme/Allergies: Negative.   Psychiatric/Behavioral: Negative.       Objective:   Vitals:   08/12/21 1540  BP: 110/62  Pulse: (!) 52  Resp: 16  Temp: (!) 97.2 F (36.2 C)  SpO2: 99%   Height: 5' 4.5" (163.8 cm)  Weight: 146 lb (66.2 kg)   Physical Exam Constitutional:      Appearance: She is not diaphoretic.  HENT:     Head: Normocephalic.     Right Ear: Tympanic membrane, ear canal and external ear normal.     Left Ear: Tympanic membrane, ear canal and external ear normal.     Nose: Nose normal. No mucosal edema or rhinorrhea.     Mouth/Throat:     Pharynx: Uvula midline. No oropharyngeal exudate.  Eyes:     Conjunctiva/sclera: Conjunctivae normal.  Neck:     Thyroid: No thyromegaly.     Trachea: Trachea normal. No tracheal tenderness or tracheal deviation.  Cardiovascular:     Rate and Rhythm: Normal rate and  regular rhythm.     Heart sounds: Normal heart sounds, S1 normal and S2 normal. No murmur heard. Pulmonary:     Effort: No respiratory distress.     Breath sounds: Normal breath sounds. No stridor. No wheezing or rales.  Lymphadenopathy:     Head:     Right side of head: No tonsillar adenopathy.     Left side of head: No tonsillar adenopathy.     Cervical: No cervical adenopathy.  Skin:    Findings: No erythema or rash.     Nails: There is no clubbing.  Neurological:     Mental Status: She is alert.     Diagnostics: none  Assessment and Plan:   1. CVID (common variable immunodeficiency) ([place])   2. LPRD (laryngopharyngeal reflux disease)   3. Lymphocytic colitis   4. Focal hyperhidrosis    1.  Continue immunoglobulin infusions every week  2.   Continue to treat reflex / LPR:   A.  Pantoprazole 40 mg in AM  B. Famotidine 40 mg in PM   3.  Treat and prevent inflammation of upper airway:   A.  Montelukast 10 mg -1 tablet 1 time per day  B.  OTC Nasacort -1 spray each nostril 1 time per day  4. If needed:  A. Loratadine 10 mg 1 tablet 1 time per day  B. Ipratropium 0.06% - 2 sprays each nostril every 6 hours (4x/day)  5. Blood - IgG  6. Neurologic evaluation for unilateral hyperhidrosis???  7.  Start budesonide therapy for lymphocytic colitis as directed by GI as directed by GI  8.  Return to clinic in 6 months or earlier if problem  9.  Obtain fall flu vaccine and RSV vaccine  [place] will continue to use immunoglobulin infusions and will check her antibody level today.  This therapy has resulting in very good control regarding prevention of infections.  She will continue to use montelukast and Nasacort for her upper airway inflammation.  She will continue to treat her LPR with pantoprazole and famotidine.  She will be starting budesonide for her lymphocytic colitis.  After she goes through a tapering dose of budesonide she can start her colestipol and use it on a consistent basis to see if this results in prolonged improvement as it has in the past.  I do not know if she requires evaluation for her unilateral hyperhidrosis.  It is quite possible she has Horner syndrome with compensatory sweating on the opposite side of her face or she has a primary unilateral hyperhidrosis.  Fortunately, this issue has been active for 5 years and has not progressed into any significant neurological issue.  I have asked her to check her eye size during her episodes to see if there is some unilateral changes in her lids or pupils.  Allena Katz, MD Allergy / Immunology [place]

## 2021-08-12 NOTE — Progress Notes (Signed)
Established Patient Office Visit     CC/Reason for Visit: Follow-up chronic conditions, requesting lab work  HPI: Beverly [name] is a 76 y.o. female who is coming in today for the above mentioned reasons. Past Medical History is significant for: Obstructive sleep apnea, hyperlipidemia, chronic pain syndrome, asthma, GERD, CIV D with hypoglycemic globule anemia, lymphocytic colitis and vitamin D deficiency.  Since I last saw her she had a preop coronary CT scan that was markedly abnormal and ended up with a cardiac catheterization, she had 2 stents and is attending cardiac rehab, she was following with cardiology routinely.  She had to be restarted on budesonide as she had significant diarrhea once tapering off.  She is requesting labs to check her hypothyroidism which is a relatively new diagnosis and her vitamin D as well as her anemia.   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   right sholder   BCC (basal cell carcinoma of skin) 05/09/2014   mid lower back   BCC (basal cell carcinoma of skin) 05/03/2017   right low back   BCC (basal cell carcinoma of skin) 05/03/2017   left upper back   BCC (basal cell carcinoma of skin) 07/05/2018   left mid back   BCC (basal cell carcinoma of skin) 05/20/1992   upper back   BCC (basal cell carcinoma of skin) 07/29/1993   left sholder medial   BCC (basal cell carcinoma of skin) 07/29/1993   left sholder lateral   BCC (basal cell carcinoma of skin) 07/29/1993   right thigh   BCC (basal cell carcinoma of skin) 07/29/1993   right sholder   BCC (basal cell carcinoma of skin) 12/22/1994   right mid forearm   BCC (basal cell carcinoma of skin) 12/22/1994    right upper forearm   BCC (basal cell carcinoma of skin) 12/22/1994   lower right upper arm   BCC (basal cell carcinoma of skin) 12/22/1994   right upper arm sholder   BCC (basal cell carcinoma of skin) 08/11/1995   left leg below knee   BCC (basal cell carcinoma of skin) 04/11/2002   mid back   BCC (basal cell carcinoma of skin) 12/10/2002   right center upper back   BCC (basal cell carcinoma of skin) 05/26/2005   right post sholder   BCC (basal cell carcinoma) 05/09/2014   left inner shin   BCC (basal cell carcinoma) 06/12/2014   left forearm   Bowen's disease 10/07/1994   right post knee, right inner forearm/wrist   Bowen's disease 08/11/1995   left sholder   Bronchiectasis (HCC)    Cataract    left   Chronic pain    Common variable immunodeficiency [facility])    sees dr Ellin Goodie 02-11-2021 epic   Coronary artery disease    Depression    hx of   Diverticulosis of colon 1998   mild   DJD (degenerative joint disease)    Duodenal ulcer    h/o yrs ago   GERD (gastroesophageal reflux disease)    History of SCC (squamous cell carcinoma) of skin    Dr. Jorja Loa   History of sinus bradycardia  HLD (hyperlipidemia)    hypertriglyceridemia   Hypertensive retinopathy of both eyes 11/18/2017   Hypothyroidism    IBS (irritable bowel syndrome)    Internal hemorrhoids 1998   Lymphocytic colitis    Mitral regurgitation    mild   Nocardiosis    relased by infection disease dec 2022   Osteoarthritis    feet,shoulder,neck,back,hips and hands.   Pneumonia 2015   PONV (postoperative nausea and vomiting)    Rotator cuff tear, right 02/2019   infraspinatus and supraspinatus, and dislocation of long head of bicep tendon (Dr. Charlann Boxer)   SCC (squamous cell carcinoma) 11/26/2014   left hand, right hand, right deltoid mnay areas   SCC (squamous cell carcinoma) 05/03/2017   left cheek   Sleep apnea    uses a mouth guard nightly   Squamous cell carcinoma in situ (SCCIS) 07/05/2018    left hand   Tracheobronchomalacia    Vitamin D deficiency    mild    Past Surgical History:  Procedure Laterality Date   ABDOMINAL HYSTERECTOMY  1998   complete   BLEPHAROPLASTY Bilateral 01/2018   BROW LIFT Bilateral 10/10/2018   Procedure: BROW LIFT;  Surgeon: Peggye Form, DO;  Location: [facility];  Service: Plastics;  Laterality: Bilateral;   BUNIONECTOMY     R 12/08, L 2004 (Dr. Wynelle Cleveland)   CARDIAC CATHETERIZATION     CARPAL TUNNEL RELEASE  1989   bilateral   CARPAL TUNNEL RELEASE Left 03/06/2021   Procedure: Left Revision Carpal Tunnel Release with hypothenar fat pad flap;  Surgeon: Gomez Cleverly, MD;  Location: [facility];  Service: Orthopedics;  Laterality: Left;  with local anesthesia   CATARACT EXTRACTION Bilateral Left in 08/2012, Right 04/2016   Dr.Hecker   CESAREAN SECTION     1981 and 1986   CHOLECYSTECTOMY  1992   laparoscopic   CLOSED REDUCTION NASAL FRACTURE N/A 08/29/2018   Procedure: CLOSED REDUCTION NASAL FRACTURE;  Surgeon: Peggye Form, DO;  Location: [facility];  Service: Plastics;  Laterality: N/A;  1 hour, please   COLONOSCOPY  2006   COLONOSCOPY  01/2012   due again 01/2021; mild diverticulosis   CORONARY STENT INTERVENTION N/A 05/19/2021   Procedure: CORONARY STENT INTERVENTION;  Surgeon: Yvonne Kendall, MD;  Location: [facility];  Service: Cardiovascular;  Laterality: N/A;   epidural steroid injection, back  02/2010   HIP SURGERY     right bursectomy x 3   HIP SURGERY Right 2000   torn cartilage, repaired   INGUINAL HERNIA REPAIR  10/2007   bilat   INTRAVASCULAR ULTRASOUND/IVUS N/A 05/19/2021   Procedure: Intravascular Ultrasound/IVUS;  Surgeon: Yvonne Kendall, MD;  Location: [facility];  Service: Cardiovascular;  Laterality: N/A;   LEFT HEART CATH AND CORONARY ANGIOGRAPHY N/A 05/19/2021   Procedure: LEFT HEART CATH AND CORONARY ANGIOGRAPHY;  Surgeon: Yvonne Kendall, MD;  Location: [facility];  Service: Cardiovascular;  Laterality: N/A;   NECK SURGERY  1989   c6-7 cervical laminectomy and diskecotmy   REVERSE SHOULDER ARTHROPLASTY Right 04/11/2019   Procedure: REVERSE SHOULDER ARTHROPLASTY;  Surgeon: Yolonda Kida, MD;  Location: [facility];  Service: Orthopedics;  Laterality: Right;  Regional Block   SHOULDER SURGERY Left 03/2003   left rotator cuff repair   SHOULDER SURGERY Left 03/14/2018   rotator cuff repair; Dr. Shelle Iron   TONSILLECTOMY  age 18   TROCHANTERIC BURSA EXCISION Right 1978   TUBAL LIGATION  1986  WISDOM TOOTH EXTRACTION     as teenager    Social History:  reports that she has never smoked. She has never used smokeless tobacco. She reports current alcohol use of about 7.0 standard drinks of alcohol per week. She reports that she does not use drugs.  Allergies: Allergies  Allergen Reactions   Plavix [Clopidogrel] Rash   Adhesive [Tape] Rash   Codeine Rash    Family History:  Family History  Problem Relation Age of Onset   Depression Mother    Schizophrenia Mother    Cancer Father        oral   Heart disease Father        bradycardia   Esophageal cancer Father    Cancer Sister        skin and lung   COPD Sister    Hypertension Sister    Osteoporosis Sister        compression fx's x 3 11/2018   Hyperthyroidism Brother    Cancer Brother        metastatic cancer to bone (?primary)   Diabetes Maternal Grandmother    Cancer Paternal Grandmother 31       colon cancer, metastatic to liver   Colon cancer Paternal Grandmother    Liver cancer Paternal Grandmother    Hyperlipidemia Daughter    Asthma Son    ADD / ADHD Other      Current Outpatient Medications:    acetaminophen (TYLENOL) 500 MG tablet, Take 1,000 mg by mouth every 6 (six) hours as needed for moderate pain. , Disp: , Rfl:    albuterol (VENTOLIN HFA) 108 (90 Base) MCG/ACT inhaler, Inhale 2 puffs into the lungs every 6 (six) hours  as needed for wheezing or shortness of breath., Disp: 18 g, Rfl: 1   aspirin EC 81 MG tablet, Take 81 mg by mouth daily., Disp: , Rfl:    atorvastatin (LIPITOR) 40 MG tablet, Take 1 tablet by mouth daily., Disp: 90 tablet, Rfl: 1   budesonide (ENTOCORT EC) 3 MG 24 hr capsule, Take 3 capsules (9 mg total) by mouth daily for 28 days, THEN 2 capsules (6 mg total) daily for 56 days, THEN 1 capsule (3 mg total) daily., Disp: 252 capsule, Rfl: 0   Buprenorphine HCl (BELBUCA) 750 MCG FILM, Place 1 film to inside of mouth every twelve hours, Disp: 60 each, Rfl: 2   Calcium Carbonate-Vitamin D 600-200 MG-UNIT TABS, Take 1 tablet by mouth 2 (two) times daily., Disp: , Rfl:    celecoxib (CELEBREX) 200 MG capsule, TAKE 1 CAPSULE BY MOUTH 2 TIMES DAILY, Disp: 180 capsule, Rfl: 1   colestipol (COLESTID) 1 g tablet, Take 1 tablet by mouth daily., Disp: 30 tablet, Rfl: 2   diclofenac Sodium (VOLTAREN) 1 % GEL, Apply 2 g topically 4 (four) times daily as needed (joint pain)., Disp: , Rfl:    Docusate Sodium 100 MG capsule, Take 100 mg by mouth daily., Disp: , Rfl:    EPINEPHrine 0.3 mg/0.3 mL IJ SOAJ injection, Inject 0.3 mg into the muscle as needed for anaphylaxis., Disp: , Rfl:    famotidine (PEPCID) 40 MG tablet, TAKE 1 TABLET BY MOUTH AT BEDTIME., Disp: 90 tablet, Rfl: 2   ferrous sulfate 324 MG TBEC, Take 324 mg by mouth daily with breakfast., Disp: , Rfl:    Fluorouracil (TOLAK) 4 % CREA, Apply to aa total 28 applications & sun protect, Disp: 40 g, Rfl: 0   gabapentin (NEURONTIN) 800 MG tablet, Take 1  tablet by mouth at bedtime., Disp: 90 tablet, Rfl: 2   guaiFENesin (MUCINEX) 600 MG 12 hr tablet, Take 600 mg by mouth 2 (two) times daily as needed for cough or to loosen phlegm., Disp: , Rfl:    HYDROcodone-acetaminophen (NORCO) 7.5-325 MG tablet, Take 1 tablet by mouth four times a day as needed for pain, Disp: 100 tablet, Rfl: 0   ipratropium (ATROVENT) 0.03 % nasal spray, Place 1 spray into both nostrils 3  times daily., Disp: 30 mL, Rfl: 12   Lactobacillus-Inulin (CULTURELLE ADULT ULT BALANCE) CAPS, Take 1 capsule by mouth daily., Disp: , Rfl:    levothyroxine (SYNTHROID) 25 MCG tablet, Take 1 tablet (25 mcg total) by mouth daily., Disp: 90 tablet, Rfl: 1   losartan (COZAAR) 25 MG tablet, Take 1/2 tablet (12.5 mg total) by mouth daily., Disp: 90 tablet, Rfl: 0   Melatonin-Pyridoxine (MELATIN PO), Take 10 mg by mouth at bedtime., Disp: , Rfl:    methocarbamol (ROBAXIN) 500 MG tablet, Take 1 tablet by mouth three times a day as needed, Disp: 270 tablet, Rfl: 2   Misc Natural Products (FIBER 7 PO), Take 2 capsules by mouth daily., Disp: , Rfl:    montelukast (SINGULAIR) 10 MG tablet, TAKE 1 TABLET BY MOUTH AT BEDTIME., Disp: 90 tablet, Rfl: 2   Multiple Vitamins-Minerals (MULTIVITAMIN WITH MINERALS) tablet, Take 1 tablet by mouth daily., Disp: , Rfl:    nitroGLYCERIN (NITROSTAT) 0.4 MG SL tablet, Place 1 tablet (0.4 mg total) under the tongue every 5 (five) minutes as needed., Disp: 25 tablet, Rfl: 2   omega-3 acid ethyl esters (LOVAZA) 1 g capsule, TAKE 2 CAPSULES BY MOUTH 2 TIMES DAILY, Disp: 360 capsule, Rfl: 0   ondansetron (ZOFRAN-ODT) 4 MG disintegrating tablet, Take 1 tablet by mouth every 6 (six) hours as needed for nausea or vomiting., Disp: 30 tablet, Rfl: 1   pantoprazole (PROTONIX) 40 MG tablet, Take 1 tablet (40 mg total) by mouth daily with breakfast., Disp: 90 tablet, Rfl: 3   PARoxetine (PAXIL) 30 MG tablet, Take 1 tablet by mouth once a day., Disp: 90 tablet, Rfl: 1   ticagrelor (BRILINTA) 90 MG TABS tablet, Take 1 tablet by mouth 2 times daily. (stop Plavix (clopidogrel)), Disp: 180 tablet, Rfl: 3   Vitamin D, Ergocalciferol, (DRISDOL) 1.25 MG (50000 UNIT) CAPS capsule, Take 1 capsule (50,000 Units total) by mouth every 7 (seven) days for 12 doses., Disp: 12 capsule, Rfl: 0   XEMBIFY 10 GM/50ML SOLN, Inject 10 g into the skin once a week., Disp: , Rfl:   Review of Systems:   Constitutional: Denies fever, chills, diaphoresis, appetite change and fatigue.  HEENT: Denies photophobia, eye pain, redness, hearing loss, ear pain, congestion, sore throat, rhinorrhea, sneezing, mouth sores, trouble swallowing, neck pain, neck stiffness and tinnitus.   Respiratory: Denies SOB, DOE, cough, chest tightness,  and wheezing.   Cardiovascular: Denies chest pain, palpitations and leg swelling.  Gastrointestinal: Denies nausea, vomiting, abdominal pain, diarrhea, constipation, blood in stool and abdominal distention.  Genitourinary: Denies dysuria, urgency, frequency, hematuria, flank pain and difficulty urinating.  Endocrine: Denies:  changes in hair or nails, polyuria, polydipsia. Musculoskeletal: Denies myalgias, back pain, joint swelling, arthralgias and gait problem.  Skin: Denies pallor, rash and wound.  Neurological: Denies dizziness, seizures, syncope, weakness, light-headedness, numbness and headaches.  Hematological: Denies adenopathy. Easy bruising, personal or family bleeding history  Psychiatric/Behavioral: Denies suicidal ideation, mood changes, confusion, nervousness, sleep disturbance and agitation    Physical Exam: Vitals:  08/12/21 1043  BP: 122/68  Pulse: 62  Temp: 97.9 F (36.6 C)  TempSrc: Oral  SpO2: 98%  Weight: 146 lb (66.2 kg)    Body mass index is 24.3 kg/m.   Constitutional: NAD, calm, comfortable Eyes: PERRL, lids and conjunctivae normal, wears corrective lenses ENMT: Mucous membranes are moist.  Respiratory: clear to auscultation bilaterally, no wheezing, no crackles. Normal respiratory effort. No accessory muscle use.  Cardiovascular: Regular rate and rhythm, no murmurs / rubs / gallops. No extremity edema.  Psychiatric: Normal judgment and insight. Alert and oriented x 3. Normal mood.    Impression and Plan:  Coronary artery disease involving native coronary artery of native heart without angina pectoris - Plan: CBC with  Differential/Platelet, CBC with Differential/Platelet  Vitamin D deficiency - Plan: CANCELED: VITAMIN D 25 Hydroxy (Vit-D Deficiency, Fractures)  Hypothyroidism, unspecified type - Plan: TSH, TSH  CVID (common variable immunodeficiency) (HCC)  Hypogammaglobulinemia (HCC)  -Check vitamin D, hemoglobin and thyroid labs today. -Extensive chart review has been done to update her chart with her most recent medical activity since last visit.  Time spent:30 minutes reviewing chart, interviewing and examining patient and formulating plan of care.    Chaya Jan, MD [facility] at [facility]

## 2021-08-13 ENCOUNTER — Other Ambulatory Visit (HOSPITAL_COMMUNITY): Payer: Self-pay

## 2021-08-13 ENCOUNTER — Encounter: Payer: Self-pay | Admitting: Allergy and Immunology

## 2021-08-13 ENCOUNTER — Ambulatory Visit: Payer: Medicare PPO | Admitting: Cardiovascular Disease

## 2021-08-13 ENCOUNTER — Encounter (HOSPITAL_COMMUNITY): Payer: Medicare PPO

## 2021-08-13 ENCOUNTER — Encounter: Payer: Self-pay | Admitting: Cardiovascular Disease

## 2021-08-13 VITALS — BP 126/62 | HR 65 | Ht 64.0 in | Wt 146.0 lb

## 2021-08-13 DIAGNOSIS — G4733 Obstructive sleep apnea (adult) (pediatric): Secondary | ICD-10-CM | POA: Diagnosis not present

## 2021-08-13 DIAGNOSIS — I2510 Atherosclerotic heart disease of native coronary artery without angina pectoris: Secondary | ICD-10-CM | POA: Diagnosis not present

## 2021-08-13 DIAGNOSIS — J849 Interstitial pulmonary disease, unspecified: Secondary | ICD-10-CM

## 2021-08-13 DIAGNOSIS — Z01818 Encounter for other preprocedural examination: Secondary | ICD-10-CM

## 2021-08-13 LAB — CALPROTECTIN, FECAL: Calprotectin, Fecal: 330 ug/g — ABNORMAL HIGH (ref 0–120)

## 2021-08-13 NOTE — Patient Instructions (Signed)
Medication Instructions:  Your physician recommends that you continue on your current medications as directed. Please refer to the Current Medication list given to you today.  *If you need a refill on your cardiac medications before your next appointment, please call your pharmacy*  Lab Work: If you have labs (blood work) drawn today and your tests are completely normal, you will receive your results only by: [place] (if you have MyChart) OR A paper copy in the mail If you have any lab test that is abnormal or we need to change your treatment, we will call you to review the results.  Follow-Up: At [facility], you and your health needs are our priority.  As part of our continuing mission to provide you with exceptional heart care, we have created designated Provider Care Teams.  These Care Teams include your primary Cardiologist (physician) and Advanced Practice Providers (APPs -  Physician Assistants and Nurse Practitioners) who all work together to provide you with the care you need, when you need it.  We recommend signing up for the patient portal called "MyChart".  Sign up information is provided on this After Visit Summary.  MyChart is used to connect with patients for Virtual Visits (Telemedicine).  Patients are able to view lab/test results, encounter notes, upcoming appointments, etc.  Non-urgent messages can be sent to your provider as well.   To learn more about what you can do with MyChart, go to [email or web address].    Your next appointment:   6 month(s)  The format for your next appointment:   In Person  Provider:   [name], MD {  Important Information About Sugar

## 2021-08-14 LAB — IGG: IgG (Immunoglobin G), Serum: 1159 mg/dL (ref 586–1602)

## 2021-08-15 ENCOUNTER — Encounter (HOSPITAL_COMMUNITY)
Admission: RE | Admit: 2021-08-15 | Discharge: 2021-08-15 | Disposition: A | Discharge status: Home / Self Care | Payer: Medicare PPO | Source: Ambulatory Visit | Attending: Cardiovascular Disease | Admitting: Cardiovascular Disease

## 2021-08-15 ENCOUNTER — Other Ambulatory Visit (HOSPITAL_COMMUNITY): Payer: Self-pay

## 2021-08-15 DIAGNOSIS — Z955 Presence of coronary angioplasty implant and graft: Secondary | ICD-10-CM | POA: Diagnosis not present

## 2021-08-16 ENCOUNTER — Encounter: Payer: Self-pay | Admitting: Cardiovascular Disease

## 2021-08-18 ENCOUNTER — Encounter (HOSPITAL_COMMUNITY): Payer: Medicare PPO

## 2021-08-18 DIAGNOSIS — J479 Bronchiectasis, uncomplicated: Secondary | ICD-10-CM | POA: Diagnosis not present

## 2021-08-18 NOTE — Telephone Encounter (Signed)
[place] Informed her that her message would be sent to Dr. Johnsie Cancel for his advisement, since we just saw her in the office.

## 2021-08-20 ENCOUNTER — Encounter (HOSPITAL_COMMUNITY): Payer: Self-pay | Admitting: Emergency Medicine

## 2021-08-20 ENCOUNTER — Emergency Department (HOSPITAL_COMMUNITY): Payer: Medicare PPO

## 2021-08-20 ENCOUNTER — Emergency Department (HOSPITAL_COMMUNITY)
Admission: EM | Admit: 2021-08-20 | Discharge: 2021-08-20 | Disposition: A | Discharge status: Home / Self Care | Payer: Medicare PPO | Attending: Emergency Medicine | Admitting: Emergency Medicine

## 2021-08-20 ENCOUNTER — Encounter (HOSPITAL_COMMUNITY)
Admission: RE | Admit: 2021-08-20 | Discharge: 2021-08-20 | Disposition: A | Discharge status: Home / Self Care | Payer: Medicare PPO | Source: Ambulatory Visit | Attending: Cardiovascular Disease | Admitting: Cardiovascular Disease

## 2021-08-20 ENCOUNTER — Telehealth: Payer: Self-pay | Admitting: Home Health

## 2021-08-20 DIAGNOSIS — Z7982 Long term (current) use of aspirin: Secondary | ICD-10-CM | POA: Insufficient documentation

## 2021-08-20 DIAGNOSIS — I25118 Atherosclerotic heart disease of native coronary artery with other forms of angina pectoris: Secondary | ICD-10-CM

## 2021-08-20 DIAGNOSIS — R079 Chest pain, unspecified: Secondary | ICD-10-CM

## 2021-08-20 DIAGNOSIS — Z955 Presence of coronary angioplasty implant and graft: Secondary | ICD-10-CM | POA: Insufficient documentation

## 2021-08-20 DIAGNOSIS — R0789 Other chest pain: Secondary | ICD-10-CM | POA: Diagnosis not present

## 2021-08-20 DIAGNOSIS — R001 Bradycardia, unspecified: Secondary | ICD-10-CM | POA: Insufficient documentation

## 2021-08-20 LAB — CBC
HCT: 37.4 % (ref 36.0–46.0)
Hemoglobin: 12.6 g/dL (ref 12.0–15.0)
MCH: 33.1 pg (ref 26.0–34.0)
MCHC: 33.7 g/dL (ref 30.0–36.0)
MCV: 98.2 fL (ref 80.0–100.0)
Platelets: 156 10*3/uL (ref 150–400)
RBC: 3.81 MIL/uL — ABNORMAL LOW (ref 3.87–5.11)
RDW: 14.6 % (ref 11.5–15.5)
WBC: 5 10*3/uL (ref 4.0–10.5)
nRBC: 0 % (ref 0.0–0.2)

## 2021-08-20 LAB — BASIC METABOLIC PANEL WITH GFR
Anion gap: 8 (ref 5–15)
BUN: 12 mg/dL (ref 8–23)
CO2: 25 mmol/L (ref 22–32)
Calcium: 9.1 mg/dL (ref 8.9–10.3)
Chloride: 107 mmol/L (ref 98–111)
Creatinine, Ser: 0.66 mg/dL (ref 0.44–1.00)
GFR, Estimated: >60 mL/min
Glucose, Bld: 77 mg/dL (ref 70–99)
Potassium: 4.2 mmol/L (ref 3.5–5.1)
Sodium: 140 mmol/L (ref 135–145)

## 2021-08-20 LAB — TROPONIN I (HIGH SENSITIVITY)
Troponin I (High Sensitivity): 5 ng/L (ref <18)
Troponin I (High Sensitivity): 5 ng/L (ref <18)

## 2021-08-20 MED ORDER — ASPIRIN 325 MG PO TABS: 325.0000 mg | ORAL_TABLET | Freq: Every day | ORAL | Status: DC

## 2021-08-20 MED ORDER — NITROGLYCERIN 0.4 MG SL SUBL
0.4000 mg | SUBLINGUAL_TABLET | SUBLINGUAL | Status: DC | PRN
  Administered 2021-08-20: 0.4 mg via SUBLINGUAL
  Filled 2021-08-20: qty 1

## 2021-08-20 MED ORDER — ASPIRIN 81 MG PO CHEW
243.0000 mg | CHEWABLE_TABLET | Freq: Once | ORAL | Status: AC
  Administered 2021-08-20: 243 mg via ORAL

## 2021-08-20 NOTE — ED Notes (Signed)
Pt reports episode of chest pain abated w/ 1 round of SL NTG

## 2021-08-20 NOTE — ED Provider Notes (Signed)
[facility] EMERGENCY DEPARTMENT Provider Note   CSN: [id number] Arrival date & time: 08/20/21  0930     History  Chief Complaint  Patient presents with   Chest Pain    Beverly [name] is a 76 y.o. female.   Chest Pain    76 y.o. female with a History of coronary calcification on CT 06/2019,SVT/nocturnal bradycardia, chest pain with normal NST 06/2019, HLD, OSA, ILD with severe tracheobronchomalacia, status post DES placement on 05/19/2021, on aspirin and Brilinta, follows outpatient with Dr. Johnsie Cancel of cardiology who presents to the emergency department with intermittent episodic chest pain over the past few days.  The patient has noticed episodes of chest discomfort that have intermittently resolved with nitroglycerin and Tums over the past few days.  She had an episode [place] that lasted around 10 minutes and resolved with Tums.  She had an episode this morning lasting again 10 minutes during cardiac rehab.  Her previous episode several days ago had improved following nitroglycerin administration.  She had an another episode while in the ED that resolved following sublingual nitroglycerin.  She is currently chest pain-free.  Home Medications Prior to Admission medications   Medication Sig Start Date End Date Taking? Authorizing Provider  acetaminophen (TYLENOL) 500 MG tablet Take 1,000 mg by mouth every 6 (six) hours as needed for moderate pain.     [provider]  albuterol (VENTOLIN HFA) 108 (90 Base) MCG/ACT inhaler Inhale 2 puffs into the lungs every 6 (six) hours as needed for wheezing or shortness of breath. 08/12/21   Kozlow, Donnamarie Poag, MD  aspirin EC 81 MG tablet Take 81 mg by mouth daily.    [provider]  atorvastatin (LIPITOR) 40 MG tablet Take 1 tablet by mouth daily. 07/23/21 07/23/22  Isaac Bliss, Rayford Halsted, MD  budesonide (ENTOCORT EC) 3 MG 24 hr capsule Take 3 capsules (9 mg total) by mouth daily  for 28 days, THEN 2 capsules (6 mg total) daily for 56 days, THEN 1 capsule (3 mg total) daily. 08/05/21 12/23/21  Cirigliano, Dominic Pea, DO  Buprenorphine HCl (BELBUCA) 750 MCG FILM Place 1 film to inside of mouth every twelve hours 06/20/21     Calcium Carbonate-Vitamin D 600-200 MG-UNIT TABS Take 1 tablet by mouth 2 (two) times daily.    [provider]  celecoxib (CELEBREX) 200 MG capsule TAKE 1 CAPSULE BY MOUTH 2 TIMES DAILY 04/16/21 04/16/22  Isaac Bliss, Rayford Halsted, MD  colestipol (COLESTID) 1 g tablet Take 1 tablet by mouth daily. 07/15/21   Kozlow, Donnamarie Poag, MD  diclofenac Sodium (VOLTAREN) 1 % GEL Apply 2 g topically 4 (four) times daily as needed (joint pain). 12/29/18   [provider]  Docusate Sodium 100 MG capsule Take 100 mg by mouth daily. 09/27/20   [provider]  EPINEPHrine 0.3 mg/0.3 mL IJ SOAJ injection Inject 0.3 mg into the muscle as needed for anaphylaxis. 01/21/21   [provider]  famotidine (PEPCID) 40 MG tablet Take 1 tablet (40 mg total) by mouth at bedtime. 08/12/21   Kozlow, Donnamarie Poag, MD  ferrous sulfate 324 MG TBEC Take 324 mg by mouth daily with breakfast.    [provider]  Fluorouracil (TOLAK) 4 % CREA Apply to aa total 28 applications & sun protect 04/07/21   Lavonna Monarch, MD  gabapentin (NEURONTIN) 800 MG tablet Take 1 tablet by mouth at bedtime. 02/05/21     guaiFENesin ([place]) 600  MG 12 hr tablet Take 600 mg by mouth 2 (two) times daily as needed for cough or to loosen phlegm.    [provider]  HYDROcodone-acetaminophen (NORCO) 7.5-325 MG tablet Take 1 tablet by mouth four times a day as needed for pain 06/20/21     ipratropium (ATROVENT) 0.03 % nasal spray Place 1 spray into both nostrils 3 (three) times daily. 08/12/21   Kozlow, Donnamarie Poag, MD  Lactobacillus-Inulin (CULTURELLE ADULT ULT BALANCE) CAPS Take 1 capsule by mouth daily.    [provider]  levothyroxine (SYNTHROID) 25 MCG tablet Take 1 tablet (25  mcg total) by mouth daily. 05/08/21   Isaac Bliss, Rayford Halsted, MD  loratadine (CLARITIN) 10 MG tablet Take 1 tablet (10 mg total) by mouth daily as needed for allergies (Can take an extra dose during flare ups.). 08/12/21   Kozlow, Donnamarie Poag, MD  losartan (COZAAR) 25 MG tablet Take 1/2 tablet (12.5 mg total) by mouth daily. 05/20/21 11/16/21  Reino Bellis B, NP  Melatonin-Pyridoxine (MELATIN PO) Take 10 mg by mouth at bedtime. 02/13/20   [provider]  methocarbamol (ROBAXIN) 500 MG tablet Take 1 tablet by mouth three times a day as needed 06/20/21     Misc Natural Products (FIBER 7 PO) Take 2 capsules by mouth daily. 09/27/20   [provider]  montelukast (SINGULAIR) 10 MG tablet Take 1 tablet (10 mg total) by mouth at bedtime. 08/12/21   Kozlow, Donnamarie Poag, MD  Multiple Vitamins-Minerals (MULTIVITAMIN WITH MINERALS) tablet Take 1 tablet by mouth daily.    [provider]  nitroGLYCERIN (NITROSTAT) 0.4 MG SL tablet Place 1 tablet (0.4 mg total) under the tongue every 5 (five) minutes as needed. 05/20/21   Cheryln Manly, NP  omega-3 acid ethyl esters (LOVAZA) 1 g capsule TAKE 2 CAPSULES BY MOUTH 2 TIMES DAILY 07/23/21 07/23/22  Isaac Bliss, Rayford Halsted, MD  ondansetron (ZOFRAN-ODT) 4 MG disintegrating tablet Take 1 tablet by mouth every 6 (six) hours as needed for nausea or vomiting. 03/25/21   Cirigliano, Vito V, DO  pantoprazole (PROTONIX) 40 MG tablet Take 1 tablet (40 mg total) by mouth daily with breakfast. 08/07/21   Josue [name], MD  PARoxetine (PAXIL) 30 MG tablet Take 1 tablet by mouth once a day. 06/12/21     ticagrelor (BRILINTA) 90 MG TABS tablet Take 1 tablet by mouth 2 times daily. (stop Plavix (clopidogrel)) 06/16/21   Imogene Burn, PA-C  Vitamin D, Ergocalciferol, (DRISDOL) 1.25 MG (50000 UNIT) CAPS capsule Take 1 capsule (50,000 Units total) by mouth every 7 (seven) days for 12 doses. 08/12/21 11/04/21  Erline Hau, MD  XEMBIFY 10 GM/50ML SOLN  Inject 10 g into the skin once a week. 07/21/21   [provider]  potassium chloride (KLOR-CON) 10 MEQ tablet Take 2 tablets (total 20 MEQ) by mouth once 03/29/20 04/16/20  Rosiland Oz, MD      Allergies    Plavix [clopidogrel], Adhesive [tape], and Codeine    Review of Systems   Review of Systems  Cardiovascular:  Positive for chest pain.  All other systems reviewed and are negative.   Physical Exam Updated Vital Signs BP (!) 123/57   Pulse (!) 52   Temp 98.1 F (36.7 C) (Oral)   Resp 17   SpO2 98%  Physical Exam Vitals and nursing note reviewed.  Constitutional:      General: She is not in acute distress.    Appearance: She is well-developed.  HENT:     Head: Normocephalic and atraumatic.  Eyes:     Conjunctiva/sclera: Conjunctivae normal.  Cardiovascular:     Rate and Rhythm: Normal rate and regular rhythm.  Pulmonary:     Effort: Pulmonary effort is normal. No respiratory distress.     Breath sounds: Normal breath sounds.  Abdominal:     Palpations: Abdomen is soft.     Tenderness: There is no abdominal tenderness.  Musculoskeletal:        General: No swelling.     Cervical back: Neck supple.     Right lower leg: No edema.     Left lower leg: No edema.  Skin:    General: Skin is warm and dry.     Capillary Refill: Capillary refill takes less than 2 seconds.  Neurological:     Mental Status: She is alert.  Psychiatric:        Mood and Affect: Mood normal.     ED Results / Procedures / Treatments   Labs (all labs ordered are listed, but only abnormal results are displayed) Labs Reviewed  CBC - Abnormal; Notable for the following components:      Result Value   RBC 3.81 (*)    All other components within normal limits  BASIC METABOLIC PANEL  TROPONIN I (HIGH SENSITIVITY)  TROPONIN I (HIGH SENSITIVITY)    EKG EKG Interpretation  Date/Time:  Wednesday August 20 2021 09:52:25 EDT Ventricular Rate:  60 PR Interval:  236 QRS  Duration: 90 QT Interval:  440 QTC Calculation: 440 R Axis:   -16 Text Interpretation: Sinus rhythm with sinus arrhythmia with 1st degree A-V block RSR' or QR pattern in V1 suggests right ventricular conduction delay Anterior infarct , age undetermined Abnormal ECG When compared with ECG of 20-Aug-2021 09:18, PREVIOUS ECG IS PRESENT Confirmed by Regan Lemming ([id number]) on 08/20/2021 10:39:51 AM  Radiology DG Chest 2 View  Result Date: 08/20/2021 CLINICAL DATA:  Chest pain since 0830 hours this morning at cardiac rehab EXAM: CHEST - 2 VIEW COMPARISON:  06/22/2019 FINDINGS: Normal heart size post coronary stenting. Mediastinal contours and pulmonary vascularity normal. Lungs clear. No infiltrate, pleural effusion, or pneumothorax. Biconvex thoracic scoliosis and osseous demineralization with note of a RIGHT shoulder prosthesis. IMPRESSION: No acute abnormalities. Electronically Signed   By: Lavonia Dana M.D.   On: 08/20/2021 10:10    Procedures Procedures    Medications Ordered in ED Medications  nitroGLYCERIN (NITROSTAT) SL tablet 0.4 mg (0.4 mg Sublingual Given 08/20/21 1156)  aspirin chewable tablet 243 mg (243 mg Oral Given 08/20/21 1156)    ED Course/ Medical Decision Making/ A&P                           Medical Decision Making Amount and/or Complexity of Data Reviewed Labs: ordered. Radiology: ordered.  Risk OTC drugs. Prescription drug management.    76 y.o. female with a History of coronary calcification on CT 06/2019,SVT/nocturnal bradycardia, chest pain with normal NST 06/2019, HLD, OSA, ILD with severe tracheobronchomalacia, status post DES placement on 05/19/2021, on aspirin and Brilinta, follows outpatient with Dr. Johnsie Cancel of cardiology who presents to the emergency department with intermittent episodic chest pain over the past few days.  The patient has noticed episodes of chest discomfort that have intermittently resolved with nitroglycerin and Tums over the past few days.  She had an  episode [place] that lasted around 10 minutes and resolved  with Tums.  She had an episode this morning lasting again 10 minutes during cardiac rehab.  Her previous episode several days ago had improved following nitroglycerin administration.  She had an another episode while in the ED that resolved following sublingual nitroglycerin.  She is currently chest pain-free.  On arrival, the patient was afebrile, bradycardic P55, blood pressure 122/54, not tachypneic, saturating well on room air.  Sinus bradycardia noted on cardiac telemetry.  Patient without significant lightheadedness or fatigue.  She does have episodes of chest pressure which appear to resolve primarily to nitroglycerin.  She was advised by her cardiologist to present to the emergency department if she has continued episodic episodes of chest discomfort.  She presents today for evaluation.  Her initial EKG on arrival revealed sinus rhythm, ventricular rate 6 0, first-degree AV block, no ischemic changes noted.  Chest x-ray was performed which revealed no acute cardiac or pulmonary abnormality, reviewed by myself and interpreted by myself and radiology.  The patient had a screening laboratory work-up significant for CBC without a leukocytosis or anemia, troponins x2 normal, BMP unremarkable.  The patient was administered 243 mg of aspirin (she had already taken an 81 mg aspirin this morning) and 1 sublingual nitroglycerin tablet for an episode of chest pressure that lasted 10 minutes.  Cardiology was consulted due to the patient's increasing episodic chest discomfort and will see the patient in consultation.  There had been some discussion outpatient regarding starting the patient on Imdur.  Patient states that she has been compliant with her aspirin and Brilinta and has not missed any doses.  Margie Billet, NP  and the cardiology service evaluated the patient and recommended discharge following evaluation.  No  medication changes were recommended.  Recommended continued routine outpatient management.  Stable at this time for discharge.   Final Clinical Impression(s) / ED Diagnoses Final diagnoses:  Chest pain, unspecified type    Rx / DC Orders ED Discharge Orders     None         Regan Lemming, MD 08/20/21 1349

## 2021-08-20 NOTE — Telephone Encounter (Signed)
Cardiac rehab nurse called and reporting patient has been having intermittent chest pain since Sat 08/16/21, now has chest pain before exercise, BP is stable. Advised RN to direct the patient to ER for evaluation of ACS.

## 2021-08-20 NOTE — Consult Note (Addendum)
Cardiology Consultation:   Patient ID: Beverly [name] MRN: [id number]; DOB: [birth year]  Admit date: 08/20/2021 Date of Consult: 08/20/2021  PCP:  Isaac Bliss, Rayford Halsted, MD   Beverly [name] Cardiologist:  [name], MD   {   Patient Profile:   Beverly [name] is a 76 y.o. female with PMH of CAD s/p DES to LAD and ramus 05/19/21, SVT, nocturnal bradycardia, bronchiectasis, tracheobronchomalacia, pulmonary nocardiosis, common variable immunodeficiency on weekly immunoglobulin injection, lymphocytic colitis, OSA,  chronic pain syndrome, hypothyroidism, who is being seen 08/20/2021 for the evaluation of chest pain at the request of Dr. Armandina Gemma.   History of Present Illness:   Beverly [name] with above PMH presented to ER from cardiac rehab today for chest pain.   She follows Dr Johnsie Cancel outpatient for CAD. Echo from 06/23/19 showed LVEF 55-60%, normal RV, trivial MR, mild aortic sclerosis.  She had pre-op CT coronary study in 05/16/21 showing significant stenosis in the mid LAD and proximal ramus intermedius. She underwent subsequent outpatient LHC on 05/19/21 which showed severe 2 vessel CAD with 90% stenoses involving the ostial through mid LAD and ostial through proximal ramus intermedius.  Mild, nonobstructive CAD noted in the LCx and RCA. LVEF 55-65% with LVEDP 10 mmHg. She was treated with DES to ostial through mid LAD and proximal ramus. She was recommended DAPT with aspirin and Plavix for 6 months. She was seen by APP Ms Bonnell Public on 06/03/21, doing well without chest pain, attending cardiac rehab, BP was low on low dose losartan 12.'5mg'$ , and she was taking Celebrex for pain and did not want to stop. She followed up with Dr Johnsie Cancel on 08/13/21, planned for back surgery in Oct 2023 which is 6 month after DAPT and was told ok to have left ear surgery while on DAPT. She had no chest pain.   She went to cardiac rehab today reporting new onset of chest pain on Sat 08/16/21. She woke up around 6 am  with chest pain, pain was severe 6/10, across her entire chest. She took 2 tablets of SL Nitro and pain resolved after 10 min. She had tolerated well with cardiac rehab exercise on Friday 08/15/21 before that. She had recurrent chest pain yesterday while driving back home at 5pm. She states it felt like indigestion and she took Tums that resolved the pain. Pain was mild and lasted about 10 min.  She went to cardiac rehab today for a cooking class, she developed chest pain again while sitting.  Staff called cardiology service and she was advised to sent the patient to ER for further evaluation. She has no chest pain at this time, feeling well. She denied any SOB, dizziness, syncope, paresthesia, nausea, vomiting. She is taking her cardiac meds daily, states she is taking Brilinta not Plavix.    Diagnostic at ED showed unremarkable CBC and BMP. Hs trop negative x2 (5>5). CXR no acute findings. EKG with sinus bradycardia 54 bpm, first degree AVB, non-specific IVCD, bigeminy PAC. She is bradycardia 40-50s, normotensive, non-hypoxic. She was given ASA and nitro at ED. Cardiology is consulted for further input.    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   right sholder  BCC (basal cell carcinoma of skin) 05/09/2014   mid lower back   BCC (basal cell carcinoma of skin) 05/03/2017   right low back   BCC (basal cell carcinoma of skin) 05/03/2017   left upper back   BCC (basal cell carcinoma of skin) 07/05/2018   left mid back   BCC (basal cell carcinoma of skin) 05/20/1992   upper back   BCC (basal cell carcinoma of skin) 07/29/1993   left sholder medial   BCC (basal cell carcinoma of skin) 07/29/1993   left sholder lateral   BCC (basal cell carcinoma  of skin) 07/29/1993   right thigh   BCC (basal cell carcinoma of skin) 07/29/1993   right sholder   BCC (basal cell carcinoma of skin) 12/22/1994   right mid forearm   BCC (basal cell carcinoma of skin) 12/22/1994   right upper forearm   BCC (basal cell carcinoma of skin) 12/22/1994   lower right upper arm   BCC (basal cell carcinoma of skin) 12/22/1994   right upper arm sholder   BCC (basal cell carcinoma of skin) 08/11/1995   left leg below knee   BCC (basal cell carcinoma of skin) 04/11/2002   mid back   BCC (basal cell carcinoma of skin) 12/10/2002   right center upper back   BCC (basal cell carcinoma of skin) 05/26/2005   right post sholder   BCC (basal cell carcinoma) 05/09/2014   left inner shin   BCC (basal cell carcinoma) 06/12/2014   left forearm   Bowen's disease 10/07/1994   right post knee, right inner forearm/wrist   Bowen's disease 08/11/1995   left sholder   Bronchiectasis ([place])    Cataract    left   Chronic pain    Common variable immunodeficiency [facility])    sees dr Gayleen Orem 02-11-2021 epic   Coronary artery disease    Depression    hx of   Diverticulosis of colon 1998   mild   DJD (degenerative joint disease)    Duodenal ulcer    h/o yrs ago   GERD (gastroesophageal reflux disease)    History of SCC (squamous cell carcinoma) of skin    Dr. Denna Haggard   History of sinus bradycardia    HLD (hyperlipidemia)    hypertriglyceridemia   Hypertensive retinopathy of both eyes 11/18/2017   Hypothyroidism    IBS (irritable bowel syndrome)    Internal hemorrhoids 1998   Lymphocytic colitis    Mitral regurgitation    mild   Nocardiosis    relased by infection disease dec 2022   Osteoarthritis    feet,shoulder,neck,back,hips and hands.   Pneumonia 2015   PONV (postoperative nausea and vomiting)    Rotator cuff tear, right 02/2019   infraspinatus and supraspinatus, and dislocation of long head of bicep tendon (Dr. Alvan Dame)   SCC (squamous cell carcinoma)  11/26/2014   left hand, right hand, right deltoid mnay areas   SCC (squamous cell carcinoma) 05/03/2017   left cheek   Sleep apnea    uses a mouth guard nightly   Squamous cell carcinoma in situ (SCCIS) 07/05/2018   left hand   Tracheobronchomalacia    Vitamin D deficiency    mild    Past Surgical History:  Procedure Laterality Date   ABDOMINAL HYSTERECTOMY  1998   complete   BLEPHAROPLASTY Bilateral 01/2018   BROW LIFT Bilateral 10/10/2018   Procedure: BROW LIFT;  Surgeon: Wallace Going, DO;  Location: [place];  Service: Plastics;  Laterality: Bilateral;   BUNIONECTOMY     R 12/08, L 2004 (Dr. Janus Molder)   [place]   bilateral   CARPAL TUNNEL RELEASE Left 03/06/2021   Procedure: Left Revision Carpal Tunnel Release with hypothenar fat pad flap;  Surgeon: [name], MD;  Location: [place];  Service: Orthopedics;  Laterality: Left;  with local anesthesia   CATARACT EXTRACTION Bilateral Left in 08/2012, Right 04/2016   Dr.Hecker   CESAREAN SECTION     1981 and [place]   laparoscopic   CLOSED REDUCTION NASAL FRACTURE N/A 08/29/2018   Procedure: CLOSED REDUCTION NASAL FRACTURE;  Surgeon: Wallace Going, DO;  Location: [facility];  Service: Plastics;  Laterality: N/A;  1 hour, please   COLONOSCOPY  2006   COLONOSCOPY  01/2012   due again 01/2021; mild diverticulosis   CORONARY STENT INTERVENTION N/A 05/19/2021   Procedure: CORONARY STENT INTERVENTION;  Surgeon: Nelva Bush, MD;  Location: [facility];  Service: Cardiovascular;  Laterality: N/A;   epidural steroid injection, back  02/2010   HIP SURGERY     right bursectomy x 3   HIP SURGERY Right 2000   torn cartilage, repaired   INGUINAL HERNIA REPAIR  10/2007   bilat   INTRAVASCULAR ULTRASOUND/IVUS N/A 05/19/2021   Procedure: Intravascular Ultrasound/IVUS;  Surgeon: Nelva Bush, MD;  Location: [facility];  Service: Cardiovascular;  Laterality: N/A;   LEFT HEART CATH AND CORONARY ANGIOGRAPHY N/A 05/19/2021   Procedure: LEFT HEART CATH AND CORONARY ANGIOGRAPHY;  Surgeon: Nelva Bush, MD;  Location: [facility];  Service: Cardiovascular;  Laterality: N/A;   NECK SURGERY  1989   c6-7 cervical laminectomy and diskecotmy   REVERSE SHOULDER ARTHROPLASTY Right 04/11/2019   Procedure: REVERSE SHOULDER ARTHROPLASTY;  Surgeon: Nicholes Stairs, MD;  Location: [place];  Service: Orthopedics;  Laterality: Right;  Regional Block   SHOULDER SURGERY Left 03/2003   left rotator cuff repair   SHOULDER SURGERY Left 03/14/2018   rotator cuff repair; Dr. Tonita Cong   TONSILLECTOMY  age 42   TROCHANTERIC [place]     as teenager     Home Medications:  Prior to Admission medications   Medication Sig Start Date End Date Taking? Authorizing Provider  acetaminophen (TYLENOL) 500 MG tablet Take 1,000 mg by mouth every 6 (six) hours as needed for moderate pain.     [provider]  albuterol (VENTOLIN HFA) 108 (90 Base) MCG/ACT inhaler Inhale 2 puffs into the lungs every 6 (six) hours as needed for wheezing or shortness of breath. 08/12/21   Kozlow, Donnamarie Poag, MD  aspirin EC 81 MG tablet Take 81 mg by mouth daily.    [provider]  atorvastatin (LIPITOR) 40 MG tablet Take 1 tablet by mouth daily. 07/23/21 07/23/22  Isaac Bliss, Rayford Halsted, MD  budesonide (ENTOCORT EC) 3 MG 24 hr capsule Take 3 capsules (9 mg total) by mouth daily for 28 days, THEN 2 capsules (6 mg total) daily for 56 days, THEN 1 capsule (3 mg total) daily. 08/05/21 12/23/21  Cirigliano, Dominic Pea, DO  Buprenorphine HCl (BELBUCA) 750 MCG FILM Place 1 film to inside of mouth every twelve hours 06/20/21     Calcium Carbonate-Vitamin D 600-200 MG-UNIT TABS Take 1 tablet by mouth 2 (two) times daily.  [provider]  celecoxib (CELEBREX) 200 MG capsule TAKE 1 CAPSULE BY MOUTH 2 TIMES DAILY 04/16/21 04/16/22  Isaac Bliss, Rayford Halsted, MD  colestipol (COLESTID) 1 g tablet Take 1 tablet by mouth daily. 07/15/21   Kozlow, Donnamarie Poag, MD  diclofenac Sodium (VOLTAREN) 1 % GEL Apply 2 g topically 4 (four) times daily as needed (joint pain). 12/29/18   [provider]  Docusate Sodium 100 MG capsule Take 100 mg by mouth daily. 09/27/20   [provider]  EPINEPHrine 0.3 mg/0.3 mL IJ SOAJ injection Inject 0.3 mg into the muscle as needed for anaphylaxis. 01/21/21   [provider]  famotidine (PEPCID) 40 MG tablet Take 1 tablet (40 mg total) by mouth at bedtime. 08/12/21   Kozlow, Donnamarie Poag, MD  ferrous sulfate 324 MG TBEC Take 324 mg by mouth daily with breakfast.    [provider]  Fluorouracil (TOLAK) 4 % CREA Apply to aa total 28 applications & sun protect 04/07/21   Lavonna Monarch, MD  gabapentin (NEURONTIN) 800 MG tablet Take 1 tablet by mouth at bedtime. 02/05/21     guaiFENesin (MUCINEX) 600 MG 12 hr tablet Take 600 mg by mouth 2 (two) times daily as needed for cough or to loosen phlegm.    [provider]  HYDROcodone-acetaminophen (NORCO) 7.5-325 MG tablet Take 1 tablet by mouth four times a day as needed for pain 06/20/21     ipratropium (ATROVENT) 0.03 % nasal spray Place 1 spray into both nostrils 3 (three) times daily. 08/12/21   Kozlow, Donnamarie Poag, MD  Lactobacillus-Inulin (CULTURELLE ADULT ULT BALANCE) CAPS Take 1 capsule by mouth daily.    [provider]  levothyroxine (SYNTHROID) 25 MCG tablet Take 1 tablet (25 mcg total) by mouth daily. 05/08/21   Isaac Bliss, Rayford Halsted, MD  loratadine (CLARITIN) 10 MG tablet Take 1 tablet (10 mg total) by mouth daily as needed for allergies (Can take an extra dose during flare ups.). 08/12/21   Kozlow, Donnamarie Poag, MD  losartan (COZAAR) 25 MG tablet Take 1/2 tablet (12.5 mg total) by mouth daily. 05/20/21 11/16/21  Reino Bellis B,  NP  Melatonin-Pyridoxine (MELATIN PO) Take 10 mg by mouth at bedtime. 02/13/20   [provider]  methocarbamol (ROBAXIN) 500 MG tablet Take 1 tablet by mouth three times a day as needed 06/20/21     Misc Natural Products (FIBER 7 PO) Take 2 capsules by mouth daily. 09/27/20   [provider]  montelukast (SINGULAIR) 10 MG tablet Take 1 tablet (10 mg total) by mouth at bedtime. 08/12/21   Kozlow, Donnamarie Poag, MD  Multiple Vitamins-Minerals (MULTIVITAMIN WITH MINERALS) tablet Take 1 tablet by mouth daily.    [provider]  nitroGLYCERIN (NITROSTAT) 0.4 MG SL tablet Place 1 tablet (0.4 mg total) under the tongue every 5 (five) minutes as needed. 05/20/21   Cheryln Manly, NP  omega-3 acid ethyl esters (LOVAZA) 1 g capsule TAKE 2 CAPSULES BY MOUTH 2 TIMES DAILY 07/23/21 07/23/22  Isaac Bliss, Rayford Halsted, MD  ondansetron (ZOFRAN-ODT) 4 MG disintegrating tablet Take 1 tablet by mouth every 6 (six) hours as needed for nausea or vomiting. 03/25/21   Cirigliano, Vito V, DO  pantoprazole (PROTONIX) 40 MG tablet Take 1 tablet (40 mg total) by mouth daily with breakfast. 08/07/21   Josue [name], MD  PARoxetine (PAXIL) 30 MG tablet Take 1 tablet by mouth once a day. 06/12/21     ticagrelor (BRILINTA) 90 MG TABS  tablet Take 1 tablet by mouth 2 times daily. (stop Plavix (clopidogrel)) 06/16/21   Imogene Burn, PA-C  Vitamin D, Ergocalciferol, (DRISDOL) 1.25 MG (50000 UNIT) CAPS capsule Take 1 capsule (50,000 Units total) by mouth every 7 (seven) days for 12 doses. 08/12/21 11/04/21  Erline Hau, MD  XEMBIFY 10 GM/50ML SOLN Inject 10 g into the skin once a week. 07/21/21   [provider]  potassium chloride (KLOR-CON) 10 MEQ tablet Take 2 tablets (total 20 MEQ) by mouth once 03/29/20 04/16/20  Rosiland Oz, MD    Inpatient Medications: Scheduled Meds:  Continuous Infusions:  PRN Meds: nitroGLYCERIN  Allergies:    Allergies  Allergen Reactions   Plavix  [Clopidogrel] Rash   Adhesive [Tape] Rash   Codeine Rash    Social History:   Social History   Socioeconomic History   Marital status: Divorced    Spouse name: Not on file   Number of children: 2   Years of education: Not on file   Highest education level: Master's degree (e.g., MA, MS, MEng, MEd, MSW, MBA)  Occupational History   Occupation: STAFF Optician, dispensing: [employer]   Occupation: Retired  Tobacco Use   Smoking status: Never   Smokeless tobacco: Never  Vaping Use   Vaping Use: Never used  Substance and Sexual Activity   Alcohol use: Yes    Alcohol/week: 7.0 standard drinks of alcohol    Types: 7 Glasses of wine per week    Comment: 1 glass wine per day   Drug use: No   Sexual activity: Not Currently  Other Topics Concern   Not on file  Social History Narrative   Divorced, lives alone, [place]; 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   [place] for [employer] part-time   Social Determinants of Health   Financial Resource Strain: [place]  (05/17/2021)   Overall Financial Resource Strain (CARDIA)    Difficulty of Paying Living Expenses: Not hard at all  Food Insecurity: No Gem (05/17/2021)   Hunger Vital Sign    Worried About Running Out of Food in the Last Year: Never true    [place] in the Last Year: Never true  Transportation Needs: No Transportation Needs (05/17/2021)   PRAPARE - Hydrologist (Medical): No    Lack of Transportation (Non-Medical): No  Physical Activity: Insufficiently Active (05/17/2021)   Exercise Vital Sign    Days of Exercise per Week: 1 day    Minutes of Exercise per Session: 10 min  Stress: No Stress Concern Present (05/17/2021)   [place]    Feeling of Stress : Not at all  Social  Connections: Moderately Integrated (05/17/2021)   Social Connection and Isolation Panel [NHANES]    Frequency of Communication with Friends and Family: More than three times a week    Frequency of Social Gatherings with Friends and Family: More than three times a week    Attends Religious Services: More than 4 times per year    Active Member of [employer] or Organizations: Yes    Attends Archivist Meetings: More than 4 times per year    Marital Status: Divorced  Intimate Partner Violence: Not At Risk (02/04/2021)   Humiliation, Afraid, Rape, and Kick questionnaire    Fear of  Current or Ex-Partner: No    Emotionally Abused: No    Physically Abused: No    Sexually Abused: No    Family History:    Family History  Problem Relation Age of Onset   Depression Mother    Schizophrenia Mother    Cancer Father        oral   Heart disease Father        bradycardia   Esophageal cancer Father    Cancer Sister        skin and lung   COPD Sister    Hypertension Sister    Osteoporosis Sister        compression fx's x 3 11/2018   Hyperthyroidism Brother    Cancer Brother        metastatic cancer to bone (?primary)   Diabetes Maternal Grandmother    Cancer Paternal Grandmother 35       colon cancer, metastatic to liver   Colon cancer Paternal Grandmother    Liver cancer Paternal Grandmother    Hyperlipidemia Daughter    Asthma Son    ADD / ADHD Other      ROS:  Constitutional: Denied fever, chills, malaise, night sweats Eyes: Denied vision change or loss Ears/Nose/Mouth/Throat: Denied ear ache, sore throat, coughing, sinus pain Cardiovascular: see HPI  Respiratory: denied shortness of breath Gastrointestinal: Denied nausea, vomiting, abdominal pain, diarrhea Genital/Urinary: Denied dysuria, hematuria, urinary frequency/urgency Musculoskeletal: chronic back pain Skin: Denied rash, wound Neuro: Denied headache, dizziness, syncope Psych: Denied history of depression/anxiety   Endocrine: Denied history of diabetes   Physical Exam/Data:   Vitals:   08/20/21 1115 08/20/21 1130 08/20/21 1215 08/20/21 1315  BP: (!) 108/52  (!) 102/53 (!) 114/54  Pulse: (!) 45  (!) 47 (!) 45  Resp:  '17 16 14  '$ Temp:      TempSrc:      SpO2: 95%  91% 93%   No intake or output data in the 24 hours ending 08/20/21 1333    08/13/2021    8:59 AM 08/12/2021    3:40 PM 08/12/2021   10:43 AM  Last 3 Weights  Weight (lbs) 146 lb 146 lb 146 lb  Weight (kg) 66.225 kg 66.225 kg 66.225 kg     There is no height or weight on file to calculate BMI.   Vitals:  Vitals:   08/20/21 1215 08/20/21 1315  BP: (!) 102/53 (!) 114/54  Pulse: (!) 47 (!) 45  Resp: 16 14  Temp:    SpO2: 91% 93%   General Appearance: In no apparent distress, laying in bed HEENT: Normocephalic, atraumatic.  Neck: Supple, trachea midline, no JVDs Cardiovascular: Regular rate and rhythm, normal S1-S2, soft murmur  Respiratory: Resting breathing unlabored, lungs sounds clear to auscultation bilaterally, no use of accessory muscles. On room air.  No wheezes, rales or rhonchi.   Gastrointestinal: Bowel sounds positive, abdomen soft, non-tende Extremities: Able to move all extremities in bed without difficulty, no edema of BLE Musculoskeletal: Normal muscle bulk and tone Skin: Intact, warm, dry. No rashes or petechiae noted in exposed areas.  Neurologic: Alert, oriented to person, place and time. Fluent speech, no facial droop, no cognitive deficit Psychiatric: Normal affect. Mood is appropriate.     EKG:  The EKG was personally reviewed and demonstrates:    EKG with sinus bradycardia 54 bpm, first degree AVB, non-specific IVCD, bigeminy PAC.  Telemetry:  Telemetry was personally reviewed and demonstrates:   Sinus rhythm with first degree AVB,bigeminy  PACs and occasional PVCs  Relevant CV Studies:  Cath: 05/19/21   Conclusions: Severe two-vessel coronary artery disease with up to 90% stenoses involving the  ostial through mid LAD and ostial through proximal ramus intermedius.  Mild, nonobstructive CAD noted in the LCx and RCA. Normal left ventricular systolic function (LVEF 67-59%) with normal filling pressure (LVEDP 10 mmHg) Successful IVUS-guided PCI to ostial through mid LAD using Onyx Frontier 2.75 x 38 mm drug-eluting stent with 0% residual stenosis and TIMI-3 flow Successful PCI to ostial through proximal ramus intermedius using Onyx Frontier 2.0 x 22 mm drug-eluting stent with 0% residual stenosis and TIMI-3 flow.   Recommendations: Overnight extended recovery. Dual antiplatelet therapy with aspirin and clopidogrel, ideally for 6 months.  Nonemergent surgery will likely need to be delayed to allow for adequate antiplatelet therapy following two-vessel PCI. Aggressive secondary prevention.   Nelva Bush, MD [facility]   Diagnostic Dominance: Right Intervention      Echo from 06/23/19:   1. Left ventricular ejection fraction, by estimation, is 55 to 60%. The  left ventricle has normal function. The left ventricle has no regional  wall motion abnormalities. There is mild left ventricular hypertrophy.  Left ventricular diastolic function  could not be evaluated.   2. Right ventricular systolic function is normal. The right ventricular  size is normal.   3. The mitral valve is grossly normal. Trivial mitral valve  regurgitation. No evidence of mitral stenosis.   4. The aortic valve has an indeterminant number of cusps. Aortic valve  regurgitation is not visualized. Mild aortic valve sclerosis is present,  with no evidence of aortic valve stenosis.    Laboratory Data:  High Sensitivity Troponin:   Recent Labs  Lab 08/20/21 0943 08/20/21 1148  TROPONINIHS 5 5     Chemistry Recent Labs  Lab 08/20/21 0943  NA 140  K 4.2  CL 107  CO2 25  GLUCOSE 77  BUN 12  CREATININE 0.66  CALCIUM 9.1  GFRNONAA >60  ANIONGAP 8    No results for input(s): "PROT", "ALBUMIN",  "AST", "ALT", "ALKPHOS", "BILITOT" in the last 168 hours. Lipids No results for input(s): "CHOL", "TRIG", "HDL", "LABVLDL", "LDLCALC", "CHOLHDL" in the last 168 hours.  Hematology Recent Labs  Lab 08/20/21 0943  WBC 5.0  RBC 3.81*  HGB 12.6  HCT 37.4  MCV 98.2  MCH 33.1  MCHC 33.7  RDW 14.6  PLT 156   Thyroid No results for input(s): "TSH", "FREET4" in the last 168 hours.  BNPNo results for input(s): "BNP", "PROBNP" in the last 168 hours.  DDimer No results for input(s): "DDIMER" in the last 168 hours.   Radiology/Studies:  DG Chest 2 View  Result Date: 08/20/2021 CLINICAL DATA:  Chest pain since 0830 hours this morning at cardiac rehab EXAM: CHEST - 2 VIEW COMPARISON:  06/22/2019 FINDINGS: Normal heart size post coronary stenting. Mediastinal contours and pulmonary vascularity normal. Lungs clear. No infiltrate, pleural effusion, or pneumothorax. Biconvex thoracic scoliosis and osseous demineralization with note of a RIGHT shoulder prosthesis. IMPRESSION: No acute abnormalities. Electronically Signed   By: Lavonia Dana M.D.   On: 08/20/2021 10:10     Assessment and Plan:   Chest pain CAD with DES to LAD and ramus 05/19/21  - intermittent chest pain last average 10 min since 08/16/21, improved with Nitro and Tums, non-exertional - Hs trop negative x2 - EKG unchanged comparing to April 2023 - LHC 05/19/21 with DES to LAD and ramus, non-obstructive LCx and RCA -  suspect pain is reflux/ possible gastric ulcer related, on PPI, advised stop Celebrex while taking DAPT, patient refused as this is helping her chronic back pain, Hgb WNL at this time argue against GI bleed - continue medical therapy with DAPT (ASA + Brilinta) for at least 6 month from 05/19/21, losartan 12.'5mg'$ , and lipitor '40mg'$ , not on BB due to baseline bradycardia  - no further cardiac workup required at this time   Sinus bradycardia with first degree AVB - asymptomatic, baseline HR 40-50s, avoid AV nodal blocking agents       Risk Assessment/Risk Scores:   HEAR Score (for undifferentiated chest pain):  certificate not available    For questions or updates, please contact [facility] Please consult [email or web address] for contact info under    Signed, Margie Billet, NP  08/20/2021 1:33 PM

## 2021-08-20 NOTE — ED Triage Notes (Signed)
Patient brought to ED from cardiac rehab for evaluation of intermittent chest pain that started on 7/1. Patient is alert, oriented, speaking in complete sentences, and is in no apparent distress at this time.

## 2021-08-20 NOTE — Progress Notes (Signed)
Incomplete Session Note  Patient Details  Name: Beverly [name] MRN: [id number] Date of Birth: Sep 08, 1945 Referring Provider:   Flowsheet Row INTENSIVE CARDIAC REHAB ORIENT from 08/05/2021 in [place]  Referring Provider [name], MD       [name] did not complete her rehab session.  [name] reported that she had chest pain on Saturday taking 2 sublingual nitroglycerin. [name] said that the pain went away and she messaged Dr Johnsie Cancel. [name] also said that she had chest discomfort [place] and [place] 2 Tums at that time. [name] said that she developed chest discomfort in the cooking class prior to exercise today. Upon questioning [name] reported having active chest pain as mid sternal 2/10. [name] did not proceed with exercise. Telemetry rhythm Sinus rhythm with a first degree AV block rate 58. [name] was placed on 2l/min of oxygen. 12 lead ECG obtained. The card master was paged. Spoke with  Lily Kocher NP who recommended that [name] be taken to the ED for further evaluation. [name] was taken to the ED via stretcher on the [place] on 2l/min. [name] reported having no chest pain upon transfer. Report given to ED RN.Harrell Gave RN BSN

## 2021-08-20 NOTE — Discharge Instructions (Signed)
Recommend you follow-up with your cardiologist outpatient. No changes to your medications were recommended.

## 2021-08-21 ENCOUNTER — Other Ambulatory Visit (HOSPITAL_COMMUNITY): Payer: Self-pay

## 2021-08-21 ENCOUNTER — Other Ambulatory Visit (HOSPITAL_COMMUNITY)
Admission: RE | Admit: 2021-08-21 | Discharge: 2021-08-21 | Disposition: A | Discharge status: Home / Self Care | Payer: Medicare PPO | Source: Ambulatory Visit | Attending: Plastic Surgery | Admitting: Plastic Surgery

## 2021-08-21 ENCOUNTER — Other Ambulatory Visit: Payer: Self-pay | Admitting: Allergy and Immunology

## 2021-08-21 ENCOUNTER — Encounter: Payer: Self-pay | Admitting: Plastic Surgery

## 2021-08-21 ENCOUNTER — Ambulatory Visit: Payer: Medicare PPO | Admitting: Plastic Surgery

## 2021-08-21 ENCOUNTER — Inpatient Hospital Stay (HOSPITAL_COMMUNITY): Admit: 2021-08-21 | Payer: Medicare PPO

## 2021-08-21 VITALS — BP 136/57 | HR 56 | Ht 64.5 in | Wt 144.2 lb

## 2021-08-21 DIAGNOSIS — L905 Scar conditions and fibrosis of skin: Secondary | ICD-10-CM | POA: Diagnosis not present

## 2021-08-21 DIAGNOSIS — L989 Disorder of the skin and subcutaneous tissue, unspecified: Secondary | ICD-10-CM | POA: Diagnosis not present

## 2021-08-21 DIAGNOSIS — L859 Epidermal thickening, unspecified: Secondary | ICD-10-CM | POA: Diagnosis not present

## 2021-08-21 DIAGNOSIS — D485 Neoplasm of uncertain behavior of skin: Secondary | ICD-10-CM

## 2021-08-21 MED ORDER — COLESTIPOL HCL 1 G PO TABS
1.0000 g | ORAL_TABLET | Freq: Every day | ORAL | 2 refills | Status: DC
  Filled 2021-08-21: qty 30, 30d supply, fill #0

## 2021-08-21 NOTE — Progress Notes (Signed)
Procedure Note  Preoperative Dx: changing skin lesion of left ear  Postoperative Dx: Same  Procedure: excision of left ear changing skin lesion 5 mm  Anesthesia: Lidocaine 1% with 1:100,000 epinephrine  Indication for Procedure: skin lesion  Description of Procedure: Risks and complications were explained to the patient.  Consent was confirmed and the patient understands the risks and benefits.  The potential complications and alternatives were explained and the patient consents.  The patient expressed understanding the option of not having the procedure and the risks of a scar.  Time out was called and all information was confirmed to be correct.    The area was prepped and drapped.  Lidocaine 1% with epinepherine was injected in the subcutaneous area.  After waiting several minutes for the local to take affect a #15 blade was used to excise the area in an eliptical pattern. The skin edges were reapproximated with 5-0 Monocryl subcuticular running closure.  A dressing was applied.  The patient was given instructions on how to care for the area and a follow up appointment.  Beverly [name] tolerated the procedure well and there were no complications. The specimen was sent to pathology.

## 2021-08-21 NOTE — Addendum Note (Signed)
Addended by: Wallace Going on: 08/21/2021 12:00 PM   Modules accepted: Orders

## 2021-08-22 ENCOUNTER — Encounter (HOSPITAL_COMMUNITY)
Admission: RE | Admit: 2021-08-22 | Discharge: 2021-08-22 | Disposition: A | Discharge status: Home / Self Care | Payer: Medicare PPO | Source: Ambulatory Visit | Attending: Cardiovascular Disease | Admitting: Cardiovascular Disease

## 2021-08-22 ENCOUNTER — Other Ambulatory Visit (HOSPITAL_COMMUNITY): Payer: Self-pay

## 2021-08-22 DIAGNOSIS — Z955 Presence of coronary angioplasty implant and graft: Secondary | ICD-10-CM

## 2021-08-22 NOTE — Progress Notes (Signed)
Patient returned to cardiac rehab today per Dr [place] and exercised without difficulty or complaints.Thayer Headings RN BSN

## 2021-08-24 ENCOUNTER — Encounter: Payer: Self-pay | Admitting: Allergy and Immunology

## 2021-08-25 ENCOUNTER — Encounter (HOSPITAL_COMMUNITY)
Admission: RE | Admit: 2021-08-25 | Discharge: 2021-08-25 | Disposition: A | Discharge status: Home / Self Care | Payer: Medicare PPO | Source: Ambulatory Visit | Attending: Cardiovascular Disease | Admitting: Cardiovascular Disease

## 2021-08-25 ENCOUNTER — Other Ambulatory Visit (HOSPITAL_COMMUNITY): Payer: Self-pay

## 2021-08-25 DIAGNOSIS — Z955 Presence of coronary angioplasty implant and graft: Secondary | ICD-10-CM | POA: Diagnosis not present

## 2021-08-25 NOTE — Progress Notes (Unsigned)
Cardiology Office Note    Date:  08/26/2021   ID:  Aleah, Ahlgrim Mar 16, 1945, MRN [id number]   PCP:  Isaac Bliss, Rayford Halsted, MD   [place]  Cardiologist:  [name], MD   Advanced Practice Provider:  No care team member to display Electrophysiologist:  None   [phone]   Chief Complaint  Patient presents with   Hospitalization Follow-up    History of Present Illness:  Beverly [name] is a 76 y.o. female   with history of coronary calcification on CT 06/2019,SVT/nocturnal bradycardia, chest pain with normal NST 06/2019, HLD, OSA, ILD with severe tracheobronchomalacia followed by pulmonary. I saw the patient for preop evaluation for potential lumbar surgery on 05/28/21 Dr. Rolena Infante with abdominal exposure for OLIF at L3-L5 with Dr. Carlis Abbott.   I ordered Coronary CTA and this was suggestive of tight LAD and ramus. Surgery was canceled and she had cardiac cath 05/19/21 with DES LAD and DES ramus. Plan for Plavix/ASA for at least 6 months. Counseled on taking celebrex but she can't stop b/c of pain. Low dose losartan added.   Patient seen in ED 08/20/21 with recurrent chest pain felt to be M-S and reflux related. EKG unchanged and troponins negative. No further w/u recommended.  Patient comes in for f/u. Enjoying cardiac rehab. She thinks she has more indigestion because of using a straw.  Overall doing well.       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   right sholder   BCC (basal cell carcinoma of skin) 05/09/2014   mid lower back   BCC (basal cell carcinoma of skin) 05/03/2017   right low back   BCC (basal cell carcinoma of skin) 05/03/2017   left upper back   BCC (basal cell carcinoma of  skin) 07/05/2018   left mid back   BCC (basal cell carcinoma of skin) 05/20/1992   upper back   BCC (basal cell carcinoma of skin) 07/29/1993   left sholder medial   BCC (basal cell carcinoma of skin) 07/29/1993   left sholder lateral   BCC (basal cell carcinoma of skin) 07/29/1993   right thigh   BCC (basal cell carcinoma of skin) 07/29/1993   right sholder   BCC (basal cell carcinoma of skin) 12/22/1994   right mid forearm   BCC (basal cell carcinoma of skin) 12/22/1994   right upper forearm   BCC (basal cell carcinoma of skin) 12/22/1994   lower right upper arm   BCC (basal cell carcinoma of skin) 12/22/1994   right upper arm sholder   BCC (basal cell carcinoma of skin) 08/11/1995   left leg below knee   BCC (basal cell carcinoma of skin) 04/11/2002   mid back   BCC (basal cell carcinoma of skin) 12/10/2002   right center upper back   BCC (basal cell carcinoma of skin) 05/26/2005   right post sholder   BCC (basal cell carcinoma) 05/09/2014   left inner shin   BCC (basal cell carcinoma) 06/12/2014   left forearm   Bowen's disease 10/07/1994   right post knee, right inner forearm/wrist   Bowen's disease 08/11/1995   left sholder  Bronchiectasis ([place])    Cataract    left   Chronic pain    Common variable immunodeficiency [facility])    sees dr Gayleen Orem 02-11-2021 epic   Coronary artery disease    Depression    hx of   Diverticulosis of colon 1998   mild   DJD (degenerative joint disease)    Duodenal ulcer    h/o yrs ago   GERD (gastroesophageal reflux disease)    History of SCC (squamous cell carcinoma) of skin    Dr. Denna Haggard   History of sinus bradycardia    HLD (hyperlipidemia)    hypertriglyceridemia   Hypertensive retinopathy of both eyes 11/18/2017   Hypothyroidism    IBS (irritable bowel syndrome)    Internal hemorrhoids 1998   Lymphocytic colitis    Mitral regurgitation    mild   Nocardiosis    relased by infection disease dec 2022   Osteoarthritis     feet,shoulder,neck,back,hips and hands.   Pneumonia 2015   PONV (postoperative nausea and vomiting)    Rotator cuff tear, right 02/2019   infraspinatus and supraspinatus, and dislocation of long head of bicep tendon (Dr. Alvan Dame)   SCC (squamous cell carcinoma) 11/26/2014   left hand, right hand, right deltoid mnay areas   SCC (squamous cell carcinoma) 05/03/2017   left cheek   Sleep apnea    uses a mouth guard nightly   Squamous cell carcinoma in situ (SCCIS) 07/05/2018   left hand   Tracheobronchomalacia    Vitamin D deficiency    mild    Past Surgical History:  Procedure Laterality Date   ABDOMINAL HYSTERECTOMY  1998   complete   BLEPHAROPLASTY Bilateral 01/2018   BROW LIFT Bilateral 10/10/2018   Procedure: BROW LIFT;  Surgeon: Wallace Going, DO;  Location: [place];  Service: Plastics;  Laterality: Bilateral;   BUNIONECTOMY     R 12/08, L 2004 (Dr. Janus Molder)   [place]   bilateral   CARPAL TUNNEL RELEASE Left 03/06/2021   Procedure: Left Revision Carpal Tunnel Release with hypothenar fat pad flap;  Surgeon: [name], MD;  Location: [place];  Service: Orthopedics;  Laterality: Left;  with local anesthesia   CATARACT EXTRACTION Bilateral Left in 08/2012, Right 04/2016   Dr.Hecker   CESAREAN SECTION     1981 and [place]   laparoscopic   CLOSED REDUCTION NASAL FRACTURE N/A 08/29/2018   Procedure: CLOSED REDUCTION NASAL FRACTURE;  Surgeon: Wallace Going, DO;  Location: [place];  Service: Plastics;  Laterality: N/A;  1 hour, please   COLONOSCOPY  2006   COLONOSCOPY  01/2012   due again 01/2021; mild diverticulosis   CORONARY STENT INTERVENTION N/A 05/19/2021   Procedure: CORONARY STENT INTERVENTION;  Surgeon: Nelva Bush, MD;  Location: [place] [facility];  Service: Cardiovascular;  Laterality: N/A;   epidural steroid injection,  back  02/2010   HIP SURGERY     right bursectomy x 3   HIP SURGERY Right 2000   torn cartilage, repaired   INGUINAL HERNIA REPAIR  10/2007   bilat   INTRAVASCULAR ULTRASOUND/IVUS N/A 05/19/2021   Procedure: Intravascular Ultrasound/IVUS;  Surgeon: Nelva Bush, MD;  Location: [place] [facility];  Service: Cardiovascular;  Laterality: N/A;   LEFT HEART CATH AND CORONARY ANGIOGRAPHY N/A 05/19/2021   Procedure: LEFT HEART CATH AND CORONARY ANGIOGRAPHY;  Surgeon: Nelva Bush, MD;  Location: [place] [facility];  Service: Cardiovascular;  Laterality: N/A;   NECK SURGERY  1989   c6-7 cervical laminectomy and diskecotmy   REVERSE SHOULDER ARTHROPLASTY Right 04/11/2019   Procedure: REVERSE SHOULDER ARTHROPLASTY;  Surgeon: Nicholes Stairs, MD;  Location: [place];  Service: Orthopedics;  Laterality: Right;  Regional Block   SHOULDER SURGERY Left 03/2003   left rotator cuff repair   SHOULDER SURGERY Left 03/14/2018   rotator cuff repair; Dr. Tonita Cong   TONSILLECTOMY  age 76   [place] EXTRACTION     as teenager    Current Medications: Current Meds  Medication Sig   acetaminophen (TYLENOL) 500 MG tablet Take 1,000 mg by mouth every 6 (six) hours as needed for moderate pain.    albuterol (VENTOLIN HFA) 108 (90 Base) MCG/ACT inhaler Inhale 2 puffs into the lungs every 6 (six) hours as needed for wheezing or shortness of breath.   aspirin EC 81 MG tablet Take 81 mg by mouth daily.   atorvastatin (LIPITOR) 40 MG tablet Take 1 tablet by mouth daily.   budesonide (ENTOCORT EC) 3 MG 24 hr capsule Take 3 capsules (9 mg total) by mouth daily for 28 days, THEN 2 capsules (6 mg total) daily for 56 days, THEN 1 capsule (3 mg total) daily.   Buprenorphine HCl (BELBUCA) 750 MCG FILM Place 1 film to inside of mouth every 12 hours   Calcium Carbonate-Vitamin D 600-200 MG-UNIT TABS Take 1 tablet by mouth 2 (two) times daily.    celecoxib (CELEBREX) 200 MG capsule TAKE 1 CAPSULE BY MOUTH 2 TIMES DAILY   colestipol (COLESTID) 1 g tablet Take 1 tablet by mouth daily.   diclofenac Sodium (VOLTAREN) 1 % GEL Apply 2 g topically 4 (four) times daily as needed (joint pain).   Docusate Sodium 100 MG capsule Take 100 mg by mouth daily.   EPINEPHrine 0.3 mg/0.3 mL IJ SOAJ injection Inject 0.3 mg into the muscle as needed for anaphylaxis.   famotidine (PEPCID) 40 MG tablet Take 1 tablet by mouth at bedtime.   ferrous sulfate 324 MG TBEC Take 324 mg by mouth daily with breakfast.   Fluorouracil (TOLAK) 4 % CREA Apply to aa total 28 applications & sun protect   gabapentin (NEURONTIN) 800 MG tablet Take 1 tablet by mouth at bedtime.   guaiFENesin (MUCINEX) 600 MG 12 hr tablet Take 600 mg by mouth 2 (two) times daily as needed for cough or to loosen phlegm.   HYDROcodone-acetaminophen (NORCO) 7.5-325 MG tablet Take 1 tablet by mouth four times a day as needed for pain   ipratropium (ATROVENT) 0.03 % nasal spray Place 1 spray into both nostrils 3 (three) times daily.   Lactobacillus-Inulin (CULTURELLE ADULT ULT BALANCE) CAPS Take 1 capsule by mouth daily.   levothyroxine (SYNTHROID) 25 MCG tablet Take 1 tablet (25 mcg total) by mouth daily.   loratadine (CLARITIN) 10 MG tablet Take 1 tablet (10 mg total) by mouth daily as needed for allergies (Can take an extra dose during flare ups.).   losartan (COZAAR) 25 MG tablet Take 1/2 tablet (12.5 mg total) by mouth daily.   Melatonin-Pyridoxine (MELATIN PO) Take 10 mg by mouth at bedtime.   methocarbamol (ROBAXIN) 500 MG tablet Take 1 tablet by mouth three times a day as needed   Misc Natural Products (FIBER 7 PO) Take 2 capsules by mouth daily.   montelukast (SINGULAIR) 10 MG tablet  Take 1 tablet (10 mg total) by mouth at bedtime.   Multiple Vitamins-Minerals (MULTIVITAMIN WITH MINERALS) tablet Take 1 tablet by mouth daily.   nitroGLYCERIN (NITROSTAT) 0.4 MG SL tablet Place 1 tablet (0.4 mg  total) under the tongue every 5 (five) minutes as needed.   omega-3 acid ethyl esters (LOVAZA) 1 g capsule TAKE 2 CAPSULES BY MOUTH 2 TIMES DAILY   ondansetron (ZOFRAN-ODT) 4 MG disintegrating tablet Take 1 tablet by mouth every 6 (six) hours as needed for nausea or vomiting.   pantoprazole (PROTONIX) 40 MG tablet Take 1 tablet (40 mg total) by mouth daily with breakfast.   PARoxetine (PAXIL) 30 MG tablet Take 1 tablet by mouth once a day.   ticagrelor (BRILINTA) 90 MG TABS tablet Take 1 tablet by mouth 2 times daily. (stop Plavix (clopidogrel))   Vitamin D, Ergocalciferol, (DRISDOL) 1.25 MG (50000 UNIT) CAPS capsule Take 1 capsule (50,000 Units total) by mouth every 7 (seven) days for 12 doses.   XEMBIFY 10 GM/50ML SOLN Inject 10 g into the skin once a week.     Allergies:   Plavix [clopidogrel], Adhesive [tape], and Codeine   Social History   Socioeconomic History   Marital status: Divorced    Spouse name: Not on file   Number of children: 2   Years of education: Not on file   Highest education level: Master's degree (e.g., MA, MS, MEng, MEd, MSW, MBA)  Occupational History   Occupation: STAFF Optician, dispensing: [employer]   Occupation: Retired  Tobacco Use   Smoking status: Never   Smokeless tobacco: Never  Vaping Use   Vaping Use: Never used  Substance and Sexual Activity   Alcohol use: Yes    Alcohol/week: 7.0 standard drinks of alcohol    Types: 7 Glasses of wine per week    Comment: 1 glass wine per day   Drug use: No   Sexual activity: Not Currently  Other Topics Concern   Not on file  Social History Narrative   Divorced, lives alone, [place]; 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   [place] for [employer] part-time   Social Determinants of Health   Financial Resource Strain: [place]  (05/17/2021)   Overall Financial Resource  Strain (CARDIA)    Difficulty of Paying Living Expenses: Not hard at all  Food Insecurity: No [place] (05/17/2021)   Hunger Vital Sign    Worried About Running Out of Food in the Last Year: Never true    [place] in the Last Year: Never true  Transportation Needs: No Transportation Needs (05/17/2021)   PRAPARE - Hydrologist (Medical): No    Lack of Transportation (Non-Medical): No  Physical Activity: Insufficiently Active (05/17/2021)   Exercise Vital Sign    Days of Exercise per Week: 1 day    Minutes of Exercise per Session: 10 min  Stress: No Stress Concern Present (05/17/2021)   [place]    Feeling of Stress : Not at all  Social Connections: Moderately Integrated (05/17/2021)   Social Connection and Isolation Panel [NHANES]    Frequency of Communication with Friends and Family: More than three times a week    Frequency of Social Gatherings with Friends and Family: More than three times a week    Attends Religious  Services: More than 4 times per year    Active Member of Clubs or Organizations: Yes    Attends Music therapist: More than 4 times per year    Marital Status: Divorced     Family History:  The patient's  family history includes ADD / ADHD in an other family member; Asthma in her son; COPD in her sister; Cancer in her brother, father, and sister; Cancer (age of onset: 5) in her paternal grandmother; Colon cancer in her paternal grandmother; Depression in her mother; Diabetes in her maternal grandmother; Esophageal cancer in her father; Heart disease in her father; Hyperlipidemia in her daughter; Hypertension in her sister; Hyperthyroidism in her brother; Liver cancer in her paternal grandmother; Osteoporosis in her sister; Schizophrenia in her mother.   ROS:   Please see the history of present illness.    ROS All other systems reviewed and are  negative.   PHYSICAL EXAM:   VS:  BP (!) 104/58   Pulse (!) 56   Ht '5\' 4"'$  (1.626 m)   Wt 144 lb (65.3 kg)   SpO2 99%   BMI 24.72 kg/m   Physical Exam  GEN: Thin, in no acute distress   Neck: no JVD, carotid bruits, or masses Cardiac:RRR; no murmurs, rubs, or gallops  Respiratory:  clear to auscultation bilaterally, normal work of breathing GI: soft, nontender, nondistended, + BS Ext: without cyanosis, clubbing, or edema, Good distal pulses bilaterally Neuro:  Alert and Oriented x 3 Psych: euthymic mood, full affect  Wt Readings from Last 3 Encounters:  08/26/21 144 lb (65.3 kg)  08/21/21 144 lb 3.2 oz (65.4 kg)  08/13/21 146 lb (66.2 kg)      Studies/Labs Reviewed:   EKG:  EKG is not ordered today.     Recent Labs: 11/20/2020: ALT 29; Magnesium 2.0 08/12/2021: TSH 1.33 08/20/2021: BUN 12; Creatinine, Ser 0.66; Hemoglobin 12.6; Platelets 156; Potassium 4.2; Sodium 140   Lipid Panel    Component Value Date/Time   CHOL 137 11/20/2020 1630   CHOL 162 01/11/2019 0815   TRIG 129.0 11/20/2020 1630   HDL 40.90 11/20/2020 1630   HDL 47 01/11/2019 0815   CHOLHDL 3 11/20/2020 1630   VLDL 25.8 11/20/2020 1630   LDLCALC 70 11/20/2020 1630   LDLCALC 85 01/11/2019 0815    Additional studies/ records that were reviewed today include:    Cath: 05/19/21   Conclusions: Severe two-vessel coronary artery disease with up to 90% stenoses involving the ostial through mid LAD and ostial through proximal ramus intermedius.  Mild, nonobstructive CAD noted in the LCx and RCA. Normal left ventricular systolic function (LVEF 74-25%) with normal filling pressure (LVEDP 10 mmHg) Successful IVUS-guided PCI to ostial through mid LAD using Onyx Frontier 2.75 x 38 mm drug-eluting stent with 0% residual stenosis and TIMI-3 flow Successful PCI to ostial through proximal ramus intermedius using Onyx Frontier 2.0 x 22 mm drug-eluting stent with 0% residual stenosis and TIMI-3 flow.    Recommendations: Overnight extended recovery. Dual antiplatelet therapy with aspirin and clopidogrel, ideally for 6 months.  Nonemergent surgery will likely need to be delayed to allow for adequate antiplatelet therapy following two-vessel PCI. Aggressive secondary prevention.   Nelva Bush, MD [facility]   Diagnostic Dominance: Right Intervention        Echo from 06/23/19:    1. Left ventricular ejection fraction, by estimation, is 55 to 60%. The  left ventricle has normal function. The left ventricle has no regional  wall  motion abnormalities. There is mild left ventricular hypertrophy.  Left ventricular diastolic function  could not be evaluated.   2. Right ventricular systolic function is normal. The right ventricular  size is normal.   3. The mitral valve is grossly normal. Trivial mitral valve  regurgitation. No evidence of mitral stenosis.   4. The aortic valve has an indeterminant number of cusps. Aortic valve  regurgitation is not visualized. Mild aortic valve sclerosis is present,  with no evidence of aortic valve stenosis.      Laboratory Data:    Risk Assessment/Calculations:         ASSESSMENT:    1. Coronary artery disease involving native coronary artery of native heart without angina pectoris   2. Interstitial lung disease ([place])   3. SVT (supraventricular tachycardia) ([place])   4. Hyperlipidemia, unspecified hyperlipidemia type   5. OSA (obstructive sleep apnea)   6. Other chronic back pain      PLAN:  In order of problems listed above:  CAD cardiac cath 05/19/21 with DES LAD and DES ramus. Plan for Plavix/ASA for at least 6 months. recurrent chest pain and seen in ED 08/20/21-felt to be atypical and no further w/u needed.. Cardiac rehab. BP low on low dose losartan.  Will continue to monitor.   ILD-with chronic DOE   SVT/nocturnal bradycardia-no recent symptoms   HLD LDL 70 on lipitor   OSA   Back pain-Dr. Johnsie Cancel says earliest for her  to have surgery is Oct. -6 months post procedure.    Shared Decision Making/Informed Consent        Medication Adjustments/Labs and Tests Ordered: Current medicines are reviewed at length with the patient today.  Concerns regarding medicines are outlined above.  Medication changes, Labs and Tests ordered today are listed in the Patient Instructions below. Patient Instructions  Medication Instructions:  Your physician recommends that you continue on your current medications as directed. Please refer to the Current Medication list given to you today.  *If you need a refill on your cardiac medications before your next appointment, please call your pharmacy*   Lab Work: None If you have labs (blood work) drawn today and your tests are completely normal, you will receive your results only by: [place] (if you have MyChart) OR A paper copy in the mail If you have any lab test that is abnormal or we need to change your treatment, we will call you to review the results.   Follow-Up: At [facility], you and your health needs are our priority.  As part of our continuing mission to provide you with exceptional heart care, we have created designated Provider Care Teams.  These Care Teams include your primary Cardiologist (physician) and Advanced Practice Providers (APPs -  Physician Assistants and Nurse Practitioners) who all work together to provide you with the care you need, when you need it.  Your next appointment:   As scheduled    Signed, Ermalinda Barrios, PA-C  08/26/2021 12:42 PM    [address], [address], [place]  [address] Phone: [phone]; Fax: [phone]

## 2021-08-26 ENCOUNTER — Encounter: Payer: Self-pay | Admitting: Physician Assistant

## 2021-08-26 ENCOUNTER — Ambulatory Visit: Payer: Medicare PPO | Admitting: Physician Assistant

## 2021-08-26 ENCOUNTER — Telehealth: Payer: Self-pay

## 2021-08-26 ENCOUNTER — Encounter: Payer: Self-pay | Admitting: Gastroenterology

## 2021-08-26 VITALS — BP 104/58 | HR 56 | Ht 64.0 in | Wt 144.0 lb

## 2021-08-26 DIAGNOSIS — J849 Interstitial pulmonary disease, unspecified: Secondary | ICD-10-CM | POA: Diagnosis not present

## 2021-08-26 DIAGNOSIS — G4733 Obstructive sleep apnea (adult) (pediatric): Secondary | ICD-10-CM | POA: Diagnosis not present

## 2021-08-26 DIAGNOSIS — I471 Supraventricular tachycardia: Secondary | ICD-10-CM

## 2021-08-26 DIAGNOSIS — I2510 Atherosclerotic heart disease of native coronary artery without angina pectoris: Secondary | ICD-10-CM | POA: Diagnosis not present

## 2021-08-26 DIAGNOSIS — M549 Dorsalgia, unspecified: Secondary | ICD-10-CM

## 2021-08-26 DIAGNOSIS — G8929 Other chronic pain: Secondary | ICD-10-CM | POA: Diagnosis not present

## 2021-08-26 DIAGNOSIS — E785 Hyperlipidemia, unspecified: Secondary | ICD-10-CM

## 2021-08-26 LAB — SURGICAL PATHOLOGY

## 2021-08-26 NOTE — Telephone Encounter (Signed)
Prior Authorization for Budesonide '3mg'$  has been approved.    Key: [id number]  PA Case ID: [id number] Effective dates: 02/16/21 through 02/15/22

## 2021-08-26 NOTE — Patient Instructions (Signed)

## 2021-08-27 ENCOUNTER — Encounter (HOSPITAL_COMMUNITY)
Admission: RE | Admit: 2021-08-27 | Discharge: 2021-08-27 | Disposition: A | Discharge status: Home / Self Care | Payer: Medicare PPO | Source: Ambulatory Visit | Attending: Cardiovascular Disease | Admitting: Cardiovascular Disease

## 2021-08-27 ENCOUNTER — Telehealth: Payer: Self-pay | Admitting: *Deleted

## 2021-08-27 DIAGNOSIS — Z955 Presence of coronary angioplasty implant and graft: Secondary | ICD-10-CM

## 2021-08-27 NOTE — Telephone Encounter (Signed)
Call placed to pt to notify that pathology results were negative per Dr. Marla Roe. Verbalized understanding. No questions voiced at this time

## 2021-08-28 NOTE — Telephone Encounter (Signed)
Called patient to find out if she had a neurologist. Patient states that she had one 20 + years ago but that doctor has retired. Patient states that she will attend an appointment to wherever has the earliest appointment available. However, patient will be unavailable and on vacation from August 5th to August 17th.

## 2021-08-29 ENCOUNTER — Encounter (HOSPITAL_COMMUNITY)
Admission: RE | Admit: 2021-08-29 | Discharge: 2021-08-29 | Disposition: A | Discharge status: Home / Self Care | Payer: Medicare PPO | Source: Ambulatory Visit | Attending: Cardiovascular Disease | Admitting: Cardiovascular Disease

## 2021-08-29 ENCOUNTER — Other Ambulatory Visit (HOSPITAL_COMMUNITY): Payer: Self-pay

## 2021-08-29 ENCOUNTER — Encounter: Payer: Self-pay | Admitting: Allergy and Immunology

## 2021-08-29 DIAGNOSIS — Z955 Presence of coronary angioplasty implant and graft: Secondary | ICD-10-CM | POA: Diagnosis not present

## 2021-08-30 ENCOUNTER — Other Ambulatory Visit (HOSPITAL_COMMUNITY): Payer: Self-pay

## 2021-09-01 ENCOUNTER — Encounter (HOSPITAL_COMMUNITY)
Admission: RE | Admit: 2021-09-01 | Discharge: 2021-09-01 | Disposition: A | Discharge status: Home / Self Care | Payer: Medicare PPO | Source: Ambulatory Visit | Attending: Cardiovascular Disease | Admitting: Cardiovascular Disease

## 2021-09-01 DIAGNOSIS — Z955 Presence of coronary angioplasty implant and graft: Secondary | ICD-10-CM

## 2021-09-02 ENCOUNTER — Ambulatory Visit (INDEPENDENT_AMBULATORY_CARE_PROVIDER_SITE_OTHER): Payer: Medicare PPO | Admitting: Physician Assistant

## 2021-09-02 DIAGNOSIS — L989 Disorder of the skin and subcutaneous tissue, unspecified: Secondary | ICD-10-CM

## 2021-09-02 NOTE — Progress Notes (Signed)
This is a 76 year old female seen in our office for follow-up status post excision of left ear lesion on 08/21/2021 by Dr. Marla Roe.  Patient notes that she has been doing very well, she denies any issues or concerns.  She denies any surrounding redness warmth or discharge.  Her pathology returned showing no significant atypia.   On exam Steri-Strip was in place, this was removed which revealed some minimal dried blood with intact suture with routine healing.  No surrounding redness.   Sutures were trimmed, petroleum jelly was applied.  At this time the patient does not require any further evaluation and management from our perspective.  She will monitor for any significant changes and return immediately if she has any further questions or concerns.  The patient verbalized understanding and agreement to today's plan had no further questions or concerns.

## 2021-09-03 ENCOUNTER — Encounter (HOSPITAL_COMMUNITY)
Admission: RE | Admit: 2021-09-03 | Discharge: 2021-09-03 | Disposition: A | Discharge status: Home / Self Care | Payer: Medicare PPO | Source: Ambulatory Visit | Attending: Cardiovascular Disease | Admitting: Cardiovascular Disease

## 2021-09-03 ENCOUNTER — Other Ambulatory Visit (HOSPITAL_COMMUNITY): Payer: Self-pay

## 2021-09-03 DIAGNOSIS — Z955 Presence of coronary angioplasty implant and graft: Secondary | ICD-10-CM | POA: Diagnosis not present

## 2021-09-03 MED ORDER — COLESTIPOL HCL 1 G PO TABS
1.0000 g | ORAL_TABLET | Freq: Every day | ORAL | 0 refills | Status: DC
  Filled 2021-09-03 – 2021-09-15 (×2): qty 90, 90d supply, fill #0

## 2021-09-03 NOTE — Progress Notes (Signed)
Reviewed home exercise Rx with patient today.  Encouraged warm-up, cool-down, and stretching. Reviewed THRR of 59-118 and keeping RPE between 11-13. Encouraged to hydrate with activity.  Reviewed weather parameters for temperature and humidity for safe exercise outdoors. Reviewed S/S to terminate exercise and when to call 911 vs MD. Reviewed the use of NTG and pt was encouraged to carry at all times. Pt encouraged to always carry a cell phone for safety when exercising outdoors. Pt verbalized understanding of the home exercise Rx and was provided a copy.   Lesly Rubenstein MS, ACSM-CEP, CCRP

## 2021-09-05 ENCOUNTER — Encounter (HOSPITAL_COMMUNITY)
Admission: RE | Admit: 2021-09-05 | Discharge: 2021-09-05 | Disposition: A | Discharge status: Home / Self Care | Payer: Medicare PPO | Source: Ambulatory Visit | Attending: Cardiovascular Disease | Admitting: Cardiovascular Disease

## 2021-09-05 DIAGNOSIS — Z955 Presence of coronary angioplasty implant and graft: Secondary | ICD-10-CM

## 2021-09-08 ENCOUNTER — Encounter (HOSPITAL_COMMUNITY): Payer: Medicare PPO

## 2021-09-08 ENCOUNTER — Encounter: Payer: Self-pay | Admitting: Dermatology

## 2021-09-08 ENCOUNTER — Telehealth (HOSPITAL_COMMUNITY): Payer: Self-pay | Admitting: *Deleted

## 2021-09-08 ENCOUNTER — Ambulatory Visit: Payer: Medicare PPO | Admitting: Dermatology

## 2021-09-08 DIAGNOSIS — L820 Inflamed seborrheic keratosis: Secondary | ICD-10-CM | POA: Diagnosis not present

## 2021-09-08 DIAGNOSIS — D485 Neoplasm of uncertain behavior of skin: Secondary | ICD-10-CM

## 2021-09-08 DIAGNOSIS — L570 Actinic keratosis: Secondary | ICD-10-CM | POA: Diagnosis not present

## 2021-09-08 NOTE — Patient Instructions (Signed)

## 2021-09-08 NOTE — Telephone Encounter (Signed)
Attempted to call patient to assess status of GI symptoms, patient did not answer phone, vm left. Albertine Grates RN

## 2021-09-08 NOTE — Telephone Encounter (Signed)
Message left on departmental voice mail.  Pt will be absent today from cardiac rehab with complaint of "food poison". Will call pt and advise of our return protocol for GI issues  - 48 hours symptom free without medications for stomach issues.Cherre Huger, BSN Cardiac and Training and development officer

## 2021-09-09 NOTE — Progress Notes (Signed)
Cardiac Individual Treatment Plan  Patient Details  Name: Beverly [name] MRN: [id number] Date of Birth: 01/09/1946 Referring Provider:   Flowsheet Row INTENSIVE CARDIAC REHAB ORIENT from 08/05/2021 in [place]  Referring Provider [name], MD       Initial Encounter Date:  [place] from 08/05/2021 in [place]  Date 08/05/21       Visit Diagnosis: 05/19/21 S/P DES x 2 LAD, Ramus  Patient's Home Medications on Admission:  Current Outpatient Medications:    acetaminophen (TYLENOL) 500 MG tablet, Take 1,000 mg by mouth every 6 (six) hours as needed for moderate pain. , Disp: , Rfl:    albuterol (VENTOLIN HFA) 108 (90 Base) MCG/ACT inhaler, Inhale 2 puffs into the lungs every 6 (six) hours as needed for wheezing or shortness of breath., Disp: 18 g, Rfl: 1   aspirin EC 81 MG tablet, Take 81 mg by mouth daily., Disp: , Rfl:    atorvastatin (LIPITOR) 40 MG tablet, Take 1 tablet by mouth daily., Disp: 90 tablet, Rfl: 1   budesonide (ENTOCORT EC) 3 MG 24 hr capsule, Take 3 capsules (9 mg total) by mouth daily for 28 days, THEN 2 capsules (6 mg total) daily for 56 days, THEN 1 capsule (3 mg total) daily., Disp: 252 capsule, Rfl: 0   Buprenorphine HCl (BELBUCA) 750 MCG FILM, Place 1 film to inside of mouth every 12 hours, Disp: 60 each, Rfl: 2   Calcium Carbonate-Vitamin D 600-200 MG-UNIT TABS, Take 1 tablet by mouth 2 (two) times daily., Disp: , Rfl:    celecoxib (CELEBREX) 200 MG capsule, TAKE 1 CAPSULE BY MOUTH 2 TIMES DAILY, Disp: 180 capsule, Rfl: 1   colestipol (COLESTID) 1 g tablet, Take 1 tablet by mouth daily., Disp: 90 tablet, Rfl: 0   diclofenac Sodium (VOLTAREN) 1 % GEL, Apply 2 g topically 4 (four) times daily as needed (joint pain)., Disp: , Rfl:    Docusate Sodium 100 MG capsule, Take 100 mg by mouth daily., Disp: , Rfl:    EPINEPHrine 0.3 mg/0.3 mL IJ SOAJ injection,  Inject 0.3 mg into the muscle as needed for anaphylaxis., Disp: , Rfl:    famotidine (PEPCID) 40 MG tablet, Take 1 tablet by mouth at bedtime., Disp: 90 tablet, Rfl: 2   ferrous sulfate 324 MG TBEC, Take 324 mg by mouth daily with breakfast., Disp: , Rfl:    Fluorouracil (TOLAK) 4 % CREA, Apply to aa total 28 applications & sun protect, Disp: 40 g, Rfl: 0   gabapentin (NEURONTIN) 800 MG tablet, Take 1 tablet by mouth at bedtime., Disp: 90 tablet, Rfl: 2   guaiFENesin (MUCINEX) 600 MG 12 hr tablet, Take 600 mg by mouth 2 (two) times daily as needed for cough or to loosen phlegm., Disp: , Rfl:    HYDROcodone-acetaminophen (NORCO) 7.5-325 MG tablet, Take 1 tablet by mouth four times a day as needed for pain, Disp: 100 tablet, Rfl: 0   ipratropium (ATROVENT) 0.03 % nasal spray, Place 1 spray into both nostrils 3 (three) times daily., Disp: 30 mL, Rfl: 5   Lactobacillus-Inulin (CULTURELLE ADULT ULT BALANCE) CAPS, Take 1 capsule by mouth daily., Disp: , Rfl:    levothyroxine (SYNTHROID) 25 MCG tablet, Take 1 tablet (25 mcg total) by mouth daily., Disp: 90 tablet, Rfl: 1   loratadine (CLARITIN) 10 MG tablet, Take 1 tablet (10 mg total) by mouth daily as needed for allergies (Can take an  extra dose during flare ups.)., Disp: 60 tablet, Rfl: 5   losartan (COZAAR) 25 MG tablet, Take 1/2 tablet (12.5 mg total) by mouth daily. (Patient not taking: Reported on 09/08/2021), Disp: 90 tablet, Rfl: 0   Melatonin-Pyridoxine (MELATIN PO), Take 10 mg by mouth at bedtime., Disp: , Rfl:    methocarbamol (ROBAXIN) 500 MG tablet, Take 1 tablet by mouth three times a day as needed, Disp: 270 tablet, Rfl: 2   Misc Natural Products (FIBER 7 PO), Take 2 capsules by mouth daily., Disp: , Rfl:    montelukast (SINGULAIR) 10 MG tablet, Take 1 tablet (10 mg total) by mouth at bedtime., Disp: 90 tablet, Rfl: 1   Multiple Vitamins-Minerals (MULTIVITAMIN WITH MINERALS) tablet, Take 1 tablet by mouth daily., Disp: , Rfl:     nitroGLYCERIN (NITROSTAT) 0.4 MG SL tablet, Place 1 tablet (0.4 mg total) under the tongue every 5 (five) minutes as needed., Disp: 25 tablet, Rfl: 2   omega-3 acid ethyl esters (LOVAZA) 1 g capsule, TAKE 2 CAPSULES BY MOUTH 2 TIMES DAILY, Disp: 360 capsule, Rfl: 0   ondansetron (ZOFRAN-ODT) 4 MG disintegrating tablet, Take 1 tablet by mouth every 6 (six) hours as needed for nausea or vomiting., Disp: 30 tablet, Rfl: 1   pantoprazole (PROTONIX) 40 MG tablet, Take 1 tablet (40 mg total) by mouth daily with breakfast., Disp: 90 tablet, Rfl: 3   PARoxetine (PAXIL) 30 MG tablet, Take 1 tablet by mouth once a day., Disp: 90 tablet, Rfl: 1   ticagrelor (BRILINTA) 90 MG TABS tablet, Take 1 tablet by mouth 2 times daily. 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   right sholder   BCC (basal cell carcinoma of skin) 05/09/2014   mid lower back   BCC (basal cell carcinoma of skin) 05/03/2017   right low back   BCC (basal cell carcinoma of skin) 05/03/2017   left upper back   BCC (basal cell carcinoma of skin) 07/05/2018   left mid back   BCC (basal cell carcinoma of skin) 05/20/1992   upper back   BCC (basal cell carcinoma of skin) 07/29/1993   left sholder medial   BCC (basal cell carcinoma of skin) 07/29/1993   left sholder lateral   BCC (basal cell carcinoma of skin) 07/29/1993   right thigh   BCC (basal cell  carcinoma of skin) 07/29/1993   right sholder   BCC (basal cell carcinoma of skin) 12/22/1994   right mid forearm   BCC (basal cell carcinoma of skin) 12/22/1994   right upper forearm   BCC (basal cell carcinoma of skin) 12/22/1994   lower right upper arm   BCC (basal cell carcinoma of skin) 12/22/1994   right upper arm sholder   BCC (basal cell carcinoma of skin) 08/11/1995   left leg below knee  BCC (basal cell carcinoma of skin) 04/11/2002   mid back   BCC (basal cell carcinoma of skin) 12/10/2002   right center upper back   BCC (basal cell carcinoma of skin) 05/26/2005   right post sholder   BCC (basal cell carcinoma) 05/09/2014   left inner shin   BCC (basal cell carcinoma) 06/12/2014   left forearm   Bowen's disease 10/07/1994   right post knee, right inner forearm/wrist   Bowen's disease 08/11/1995   left sholder   Bronchiectasis ([place])    Cataract    left   Chronic pain    Common variable immunodeficiency [facility])    sees dr Gayleen Orem 02-11-2021 epic   Coronary artery disease    Depression    hx of   Diverticulosis of colon 1998   mild   DJD (degenerative joint disease)    Duodenal ulcer    h/o yrs ago   GERD (gastroesophageal reflux disease)    History of SCC (squamous cell carcinoma) of skin    Dr. Denna Haggard   History of sinus bradycardia    HLD (hyperlipidemia)    hypertriglyceridemia   Hypertensive retinopathy of both eyes 11/18/2017   Hypothyroidism    IBS (irritable bowel syndrome)    Internal hemorrhoids 1998   Lymphocytic colitis    Mitral regurgitation    mild   Nocardiosis    relased by infection disease dec 2022   Osteoarthritis    feet,shoulder,neck,back,hips and hands.   Pneumonia 2015   PONV (postoperative nausea and vomiting)    Rotator cuff tear, right 02/2019   infraspinatus and supraspinatus, and dislocation of long head of bicep tendon (Dr. Alvan Dame)   SCC (squamous cell carcinoma) 11/26/2014   left hand, right hand, right deltoid mnay  areas   SCC (squamous cell carcinoma) 05/03/2017   left cheek   Sleep apnea    uses a mouth guard nightly   Squamous cell carcinoma in situ (SCCIS) 07/05/2018   left hand   Tracheobronchomalacia    Vitamin D deficiency    mild    Tobacco Use: Social History   Tobacco Use  Smoking Status Never  Smokeless Tobacco Never    Labs: Review Flowsheet  More data exists      Latest Ref Rng & Units 06/10/2017 07/12/2018 01/11/2019 06/22/2019 11/20/2020  Labs for ITP Cardiac and Pulmonary Rehab  Cholestrol 0 - 200 mg/dL 146  145  162  161  137   LDL (calc) 0 - 99 mg/dL 72  61  85  73  70   HDL-C >39.00 mg/dL 47  36  47  49  40.90   Trlycerides 0.0 - 149.0 mg/dL 135  238  173  197  129.0     Capillary Blood Glucose: No results found for: "GLUCAP"   Exercise Target Goals: Exercise Program Goal: Individual exercise prescription set using results from initial 6 min walk test and THRR while considering  patient's activity barriers and safety.   Exercise Prescription Goal: Initial exercise prescription builds to 30-45 minutes a day of aerobic activity, 2-3 days per week.  Home exercise guidelines will be given to patient during program as part of exercise prescription that the participant will acknowledge.  Activity Barriers & Risk Stratification:  Activity Barriers & Cardiac Risk Stratification - 08/05/21 1413       Activity Barriers & Cardiac Risk Stratification   Activity Barriers Back Problems;Arthritis;Joint Problems;Neck/Spine Problems;Muscular Weakness;Balance Concerns    Cardiac Risk Stratification High  6 Minute Walk:  6 Minute Walk     Row Name 08/05/21 1412         6 Minute Walk   Phase Initial     Distance 1211 feet     Walk Time 6 minutes     # of Rest Breaks 0     MPH 2.3     METS 2.53     RPE 11     Perceived Dyspnea  0     VO2 Peak 8.85     Symptoms No     Resting HR 70 bpm     Resting BP 114/60     Resting Oxygen Saturation  98 %      Exercise Oxygen Saturation  during 6 min walk 98 %     Max Ex. HR 75 bpm     Max Ex. BP 142/64     2 Minute Post BP 130/64              Oxygen Initial Assessment:   Oxygen Re-Evaluation:   Oxygen Discharge (Final Oxygen Re-Evaluation):   Initial Exercise Prescription:  Initial Exercise Prescription - 08/05/21 1400       Date of Initial Exercise RX and Referring Provider   Date 08/05/21    Referring Provider [name], MD    Expected Discharge Date 10/03/21      NuStep   Level 2    SPM 75    Minutes 30    METs 2.5      Prescription Details   Frequency (times per week) 3    Duration Progress to 30 minutes of continuous aerobic without signs/symptoms of physical distress      Intensity   THRR 40-80% of Max Heartrate 59-118    Ratings of Perceived Exertion 11-13    Perceived Dyspnea 0-4      Progression   Progression Continue progressive overload as per policy without signs/symptoms or physical distress.      Resistance Training   Training Prescription Yes    Weight 2 lbs    Reps 10-15             Perform Capillary Blood Glucose checks as needed.  Exercise Prescription Changes:   Exercise Prescription Changes     Row Name 08/11/21 1300 09/03/21 1400           Response to Exercise   Blood Pressure (Admit) 124/60 114/60      Blood Pressure (Exercise) 128/66 160/80      Blood Pressure (Exit) 102/60 100/52      Heart Rate (Admit) 67 bpm 84 bpm      Heart Rate (Exercise) 72 bpm 100 bpm      Heart Rate (Exit) 57 bpm 58 bpm      Rating of Perceived Exertion (Exercise) 12 13      Perceived Dyspnea (Exercise) -- 3      Symptoms None None      Comments Pt's first day in the CRP2 program Reviewed METs, goals and Home exercise Rx      Duration Continue with 30 min of aerobic exercise without signs/symptoms of physical distress. Continue with 30 min of aerobic exercise without signs/symptoms of physical distress.      Intensity THRR unchanged THRR  unchanged        Progression   Progression Continue to progress workloads to maintain intensity without signs/symptoms of physical distress. Continue to progress workloads to maintain intensity without signs/symptoms of physical distress.  Average METs 2.3 3.7        Resistance Training   Training Prescription Yes No      Weight 2 lbs No weights on Wednesdays      Reps 10-15 --      Time 10 Minutes --        Interval Training   Interval Training No No        NuStep   Level 2 3      SPM 79 --      Minutes 30 30      METs 2.3 3.7        Home Exercise Plan   Plans to continue exercise at -- Home (comment)      Frequency -- Add 2 additional days to program exercise sessions.      Initial Home Exercises Provided -- 09/03/21               Exercise Comments:   Exercise Comments     Row Name 08/11/21 1403 09/03/21 1458         Exercise Comments Pt's first day in the CRP2 program. Pt had no complaints with todays session and is off to a good start. Reviewed METs, goals and home exercise Rx. Pt verbalized understanding of the home exercise Rx and was provided a copy.               Exercise Goals and Review:   Exercise Goals     Row Name 08/05/21 1417             Exercise Goals   Increase Physical Activity Yes       Intervention Provide advice, education, support and counseling about physical activity/exercise needs.;Develop an individualized exercise prescription for aerobic and resistive training based on initial evaluation findings, risk stratification, comorbidities and participant's personal goals.       Expected Outcomes Short Term: Attend rehab on a regular basis to increase amount of physical activity.;Long Term: Add in home exercise to make exercise part of routine and to increase amount of physical activity.;Long Term: Exercising regularly at least 3-5 days a week.       Increase Strength and Stamina Yes       Intervention Provide advice, education,  support and counseling about physical activity/exercise needs.;Develop an individualized exercise prescription for aerobic and resistive training based on initial evaluation findings, risk stratification, comorbidities and participant's personal goals.       Expected Outcomes Short Term: Increase workloads from initial exercise prescription for resistance, speed, and METs.;Short Term: Perform resistance training exercises routinely during rehab and add in resistance training at home;Long Term: Improve cardiorespiratory fitness, muscular endurance and strength as measured by increased METs and functional capacity (6MWT)       Able to understand and use rate of perceived exertion (RPE) scale Yes       Intervention Provide education and explanation on how to use RPE scale       Expected Outcomes Short Term: Able to use RPE daily in rehab to express subjective intensity level;Long Term:  Able to use RPE to guide intensity level when exercising independently       Knowledge and understanding of Target Heart Rate Range (THRR) Yes       Intervention Provide education and explanation of THRR including how the numbers were predicted and where they are located for reference       Expected Outcomes Short Term: Able to state/look up THRR;Short Term: Able to use daily as  guideline for intensity in rehab;Long Term: Able to use THRR to govern intensity when exercising independently       Understanding of Exercise Prescription Yes       Intervention Provide education, explanation, and written materials on patient's individual exercise prescription       Expected Outcomes Short Term: Able to explain program exercise prescription;Long Term: Able to explain home exercise prescription to exercise independently                Exercise Goals Re-Evaluation :  Exercise Goals Re-Evaluation     Beverly [name] Name 08/11/21 1402 09/03/21 1452           Exercise Goal Re-Evaluation   Exercise Goals Review Increase Physical  Activity;Increase Strength and Stamina;Able to understand and use rate of perceived exertion (RPE) scale;Knowledge and understanding of Target Heart Rate Range (THRR);Understanding of Exercise Prescription Increase Physical Activity;Increase Strength and Stamina;Able to understand and use rate of perceived exertion (RPE) scale;Knowledge and understanding of Target Heart Rate Range (THRR);Understanding of Exercise Prescription      Comments Pt's first day in the CRP2 program. Pt understands the exercise RX, RPE scale and THRR. Reviewed METs, goals and home exercise Rx. Pt is not doing any regualr exercise, but works in the yard daily and enjoys dancing. The patient will begin to supplement her exercise here in the cRP2 program by dancing at home 30 miunutes / day x2 /week. Pt can break up the dancing into 10 or 15 minute time frames. Pt had a goal of increased strength and stamina, and she does feel stonger and voices her balance has improved. Pt still feels tired and takes naps. Even though the fatigue is not gone, it has improved as well.      Expected Outcomes Will continue to monitor patient and progress exercise workloads as tolerated. Pt will begin her home exercise by dancing.               Discharge Exercise Prescription (Final Exercise Prescription Changes):  Exercise Prescription Changes - 09/03/21 1400       Response to Exercise   Blood Pressure (Admit) 114/60    Blood Pressure (Exercise) 160/80    Blood Pressure (Exit) 100/52    Heart Rate (Admit) 84 bpm    Heart Rate (Exercise) 100 bpm    Heart Rate (Exit) 58 bpm    Rating of Perceived Exertion (Exercise) 13    Perceived Dyspnea (Exercise) 3    Symptoms None    Comments Reviewed METs, goals and Home exercise Rx    Duration Continue with 30 min of aerobic exercise without signs/symptoms of physical distress.    Intensity THRR unchanged      Progression   Progression Continue to progress workloads to maintain intensity without  signs/symptoms of physical distress.    Average METs 3.7      Resistance Training   Training Prescription No    Weight No weights on Wednesdays      Interval Training   Interval Training No      NuStep   Level 3    Minutes 30    METs 3.7      Home Exercise Plan   Plans to continue exercise at Home (comment)    Frequency Add 2 additional days to program exercise sessions.    Initial Home Exercises Provided 09/03/21             Nutrition:  Target Goals: Understanding of nutrition guidelines, daily intake of sodium '1500mg'$ ,  cholesterol '200mg'$ , calories 30% from fat and 7% or less from saturated fats, daily to have 5 or more servings of fruits and vegetables.  Biometrics:  Pre Biometrics - 08/05/21 1135       Pre Biometrics   Waist Circumference 36.25 inches    Hip Circumference 39.25 inches    Waist to Hip Ratio 0.92 %    Triceps Skinfold 20 mm    % Body Fat 36.3 %    Grip Strength 25 kg    Flexibility --   Not done   Single Leg Stand 10.56 seconds              Nutrition Therapy Plan and Nutrition Goals:  Nutrition Therapy & Goals - 08/25/21 1024       Nutrition Therapy   Diet Heart Healthy Diet    Drug/Food Interactions Statins/Certain Fruits      Personal Nutrition Goals   Nutrition Goal Patient to choose a variety of fruits, vegetables, lean protein/plant protein, whole grains, and nonfat dairy daily as part of heart healthy diet.    Personal Goal #2 Patient to limit to '1500mg'$  of sodium daily    Personal Goal #3 Patient to identify and limit daily intake of saturated fat, trans fat, sodium, and refined carbohydrates.    Comments Goals in progress. Patient reports committment to heart healthy diet; she recently purchased an airfyer. She reports improved acid reflux since stopped drinking with a straw; hx of lymphocytic colitis, GERD, ulcer.      Intervention Plan   Intervention Prescribe, educate and counsel regarding individualized specific dietary  modifications aiming towards targeted core components such as weight, hypertension, lipid management, diabetes, heart failure and other comorbidities.    Expected Outcomes Short Term Goal: Understand basic principles of dietary content, such as calories, fat, sodium, cholesterol and nutrients.;Long Term Goal: Adherence to prescribed nutrition plan.             Nutrition Assessments:  MEDIFICTS Score Key: ?70 Need to make dietary changes  40-70 Heart Healthy Diet ? 40 Therapeutic Level Cholesterol Diet    Picture Your Plate Scores: <76 Unhealthy dietary pattern with much room for improvement. 41-50 Dietary pattern unlikely to meet recommendations for good health and room for improvement. 51-60 More healthful dietary pattern, with some room for improvement.  >60 Healthy dietary pattern, although there may be some specific behaviors that could be improved.    Nutrition Goals Re-Evaluation:  Nutrition Goals Re-Evaluation     [place] Name 08/11/21 0945 08/25/21 1024           Goals   Current Weight 147 lb 7.8 oz (66.9 kg) 145 lb 8.1 oz (66 kg)      Comment -- Lipid panel WNL, BMI normal.      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 Labs WNL and normal weight BMI. Goals in progress. Patient reports committment to heart healthy diet; she recently purchased an airfyer. She reports improved acid reflux since stopped drinking with a straw; hx of lymphocytic colitis, GERD, ulcer.               Nutrition Goals Re-Evaluation:  Nutrition Goals Re-Evaluation     [place] Name 08/11/21 0945 08/25/21 1024           Goals   Current Weight 147 lb 7.8 oz (66.9 kg) 145 lb 8.1 oz (66 kg)      Comment -- Lipid panel WNL, BMI normal.  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 Labs WNL and normal weight BMI. Goals in progress. Patient reports committment to heart healthy diet; she  recently purchased an airfyer. She reports improved acid reflux since stopped drinking with a straw; hx of lymphocytic colitis, GERD, ulcer.               Nutrition Goals Discharge (Final Nutrition Goals Re-Evaluation):  Nutrition Goals Re-Evaluation - 08/25/21 1024       Goals   Current Weight 145 lb 8.1 oz (66 kg)    Comment Lipid panel WNL, BMI normal.    Expected Outcome Goals in progress. Patient reports committment to heart healthy diet; she recently purchased an airfyer. She reports improved acid reflux since stopped drinking with a straw; hx of lymphocytic colitis, GERD, ulcer.             Psychosocial: Target Goals: Acknowledge presence or absence of significant depression and/or stress, maximize coping skills, provide positive support system. Participant is able to verbalize types and ability to use techniques and skills needed for reducing stress and depression.  Initial Review & Psychosocial Screening:  Initial Psych Review & Screening - 08/05/21 1525       Initial Review   Current issues with History of Depression;Current Stress Concerns    Source of Stress Concerns Chronic Illness    Comments Beverly [name] needs back surgery which will be deferred due to her recent cardiac stenting.      Family Dynamics   Good Support System? Yes   Beverly [name] lives alone. Beverly [name] has friends in the area and two children one who lives in [place] the other lives in [place] has a history of depression. Beverly [name]'s depression is currently controlled on an antidepressant.      Barriers   Psychosocial barriers to participate in program The patient should benefit from training in stress management and relaxation.      Screening Interventions   Interventions Encouraged to exercise             Quality of Life Scores:  Quality of Life - 08/05/21 1406       Quality of Life   Select Quality of Life      Quality of Life Scores   Health/Function Pre 26.46 %     Socioeconomic Pre 30 %    Psych/Spiritual Pre 29.64 %    Family Pre 30 %    GLOBAL Pre 28.32 %            Scores of 19 and below usually indicate a poorer quality of life in these areas.  A difference of  2-3 points is a clinically meaningful difference.  A difference of 2-3 points in the total score of the Quality of Life Index has been associated with significant improvement in overall quality of life, self-image, physical symptoms, and general health in studies assessing change in quality of life.  PHQ-9: Review Flowsheet  More data exists      08/12/2021 08/05/2021 12/10/2020 11/20/2020 08/06/2020  Depression screen PHQ 2/9  Decreased Interest 0 0 0 0 0  Down, Depressed, Hopeless 0 0 0 0 0  PHQ - 2 Score 0 0 0 0 0  Altered sleeping 0 - - 0 -  Tired, decreased energy 0 - - 0 -  Change in appetite 0 - - 0 -  Feeling bad or failure about yourself  0 - - 0 -  Trouble  concentrating 0 - - 0 -  Moving slowly or fidgety/restless 0 - - 0 -  Suicidal thoughts 0 - - 0 -  PHQ-9 Score 0 - - 0 -  Difficult doing work/chores Not difficult at all - - - -   Interpretation of Total Score  Total Score Depression Severity:  1-4 = Minimal depression, 5-9 = Mild depression, 10-14 = Moderate depression, 15-19 = Moderately severe depression, 20-27 = Severe depression   Psychosocial Evaluation and Intervention:   Psychosocial Re-Evaluation:  Psychosocial Re-Evaluation     [place] Name 08/12/21 0858 09/09/21 1736           Psychosocial Re-Evaluation   Current issues with History of Depression;Current Stress Concerns History of Depression;Current Stress Concerns      Comments Beverly [name] did not voice any increased concerns or stressors on her first day of exercise Beverly [name] has not voiced any increased concerns or stressors during intensive cardiac rehab.      Expected Outcomes Beverly [name] will have decreased stress and controlled depression upon completion of phase 2 cardiac rehab Beverly [name] will have  decreased stress and controlled depression upon completion of phase 2 cardiac rehab      Interventions Stress management education;Relaxation education;Encouraged to attend Cardiac Rehabilitation for the exercise Stress management education;Relaxation education;Encouraged to attend Cardiac Rehabilitation for the exercise      Continue Psychosocial Services  No Follow up required No Follow up required        Initial Review   Source of Stress Concerns Chronic Illness Chronic Illness      Comments Will continue to montior and offer support as needed Will continue to montior and offer support as needed               Psychosocial Discharge (Final Psychosocial Re-Evaluation):  Psychosocial Re-Evaluation - 09/09/21 1736       Psychosocial Re-Evaluation   Current issues with History of Depression;Current Stress Concerns    Comments Beverly [name] has not voiced any increased concerns or stressors during intensive cardiac rehab.    Expected Outcomes Beverly [name] will have decreased stress and controlled depression upon completion of phase 2 cardiac rehab    Interventions Stress management education;Relaxation education;Encouraged to attend Cardiac Rehabilitation for the exercise    Continue Psychosocial Services  No Follow up required      Initial Review   Source of Stress Concerns Chronic Illness    Comments Will continue to montior and offer support as needed             Vocational Rehabilitation: Provide vocational rehab assistance to qualifying candidates.   Vocational Rehab Evaluation & Intervention:  Vocational Rehab - 08/05/21 1531       Initial Vocational Rehab Evaluation & Intervention   Assessment shows need for Vocational Rehabilitation No   Beverly [name] is retired and does not need vocational rehab at this time            Education: Education Goals: Education classes will be provided on a weekly basis, covering required topics. Participant will state understanding/return  demonstration of topics presented.    Education - 09/05/21 0900       Education   Cardiac Education Topics Pritikin    Select Core Videos      Core Videos   Educator Exercise Physiologist    Select Exercise Education    Exercise Education Move It!    Instruction Review Code 1- Verbalizes Understanding    Class Start Time 0815    Class Stop Time [phone]  Class Time Calculation (min) 40 min             Core Videos: Exercise    Move It!  Clinical staff conducted group or individual video education with verbal and written material and guidebook.  Patient learns the recommended Pritikin exercise program. Exercise with the goal of living a long, healthy life. Some of the health benefits of exercise include controlled diabetes, healthier blood pressure levels, improved cholesterol levels, improved heart and lung capacity, improved sleep, and better body composition. Everyone should speak with their doctor before starting or changing an exercise routine.  Biomechanical Limitations Clinical staff conducted group or individual video education with verbal and written material and guidebook.  Patient learns how biomechanical limitations can impact exercise and how we can mitigate and possibly overcome limitations to have an impactful and balanced exercise routine.  Body Composition Clinical staff conducted group or individual video education with verbal and written material and guidebook.  Patient learns that body composition (ratio of muscle mass to fat mass) is a key component to assessing overall fitness, rather than body weight alone. Increased fat mass, especially visceral belly fat, can put [place] at increased risk for metabolic syndrome, type 2 diabetes, heart disease, and even death. It is recommended to combine diet and exercise (cardiovascular and resistance training) to improve your body composition. Seek guidance from your physician and exercise physiologist before implementing an  exercise routine.  Exercise Action Plan Clinical staff conducted group or individual video education with verbal and written material and guidebook.  Patient learns the recommended strategies to achieve and enjoy long-term exercise adherence, including variety, self-motivation, self-efficacy, and positive decision making. Benefits of exercise include fitness, good health, weight management, more energy, better sleep, less stress, and overall well-being.  Medical   Heart Disease Risk Reduction Clinical staff conducted group or individual video education with verbal and written material and guidebook.  Patient learns our heart is our most vital organ as it circulates oxygen, nutrients, white blood cells, and hormones throughout the entire body, and carries waste away. Data supports a plant-based eating plan like the Pritikin Program for its effectiveness in slowing progression of and reversing heart disease. The video provides a number of recommendations to address heart disease.   Metabolic Syndrome and Belly Fat  Clinical staff conducted group or individual video education with verbal and written material and guidebook.  Patient learns what metabolic syndrome is, how it leads to heart disease, and how one can reverse it and keep it from coming back. You have metabolic syndrome if you have 3 of the following 5 criteria: abdominal obesity, high blood pressure, high triglycerides, low HDL cholesterol, and high blood sugar.  Hypertension and Heart Disease Clinical staff conducted group or individual video education with verbal and written material and guidebook.  Patient learns that high blood pressure, or hypertension, is very common in the [place]. Hypertension is largely due to excessive salt intake, but other important risk factors include being overweight, physical inactivity, drinking too much alcohol, smoking, and not eating enough potassium from fruits and vegetables. High blood pressure  is a leading risk factor for heart attack, stroke, congestive heart failure, dementia, kidney failure, and premature death. Long-term effects of excessive salt intake include stiffening of the arteries and thickening of heart muscle and organ damage. Recommendations include ways to reduce hypertension and the risk of heart disease.  Diseases of Our Time - Focusing on Diabetes Clinical staff conducted group or individual video education with verbal  and written material and guidebook.  Patient learns why the best way to stop diseases of our time is prevention, through food and other lifestyle changes. Medicine (such as prescription pills and surgeries) is often only a Band-Aid on the problem, not a long-term solution. Most common diseases of our time include obesity, type 2 diabetes, hypertension, heart disease, and cancer. The Pritikin Program is recommended and has been proven to help reduce, reverse, and/or prevent the damaging effects of metabolic syndrome.  Nutrition   Overview of the Pritikin Eating Plan  Clinical staff conducted group or individual video education with verbal and written material and guidebook.  Patient learns about the [place] for disease risk reduction. The [place] emphasizes a wide variety of unrefined, minimally-processed carbohydrates, like fruits, vegetables, whole grains, and legumes. Go, Caution, and Stop food choices are explained. Plant-based and lean animal proteins are emphasized. Rationale provided for low sodium intake for blood pressure control, low added sugars for blood sugar stabilization, and low added fats and oils for coronary artery disease risk reduction and weight management.  Calorie Density  Clinical staff conducted group or individual video education with verbal and written material and guidebook.  Patient learns about calorie density and how it impacts the Pritikin Eating Plan. Knowing the characteristics of the food you choose  will help you decide whether those foods will lead to weight gain or weight loss, and whether you want to consume more or less of them. Weight loss is usually a side effect of the Pritikin Eating Plan because of its focus on low calorie-dense foods.  Label Reading  Clinical staff conducted group or individual video education with verbal and written material and guidebook.  Patient learns about the Pritikin recommended label reading guidelines and corresponding recommendations regarding calorie density, added sugars, sodium content, and whole grains.  Dining Out - Part 1  Clinical staff conducted group or individual video education with verbal and written material and guidebook.  Patient learns that restaurant meals can be sabotaging because they can be so high in calories, fat, sodium, and/or sugar. Patient learns recommended strategies on how to positively address this and avoid unhealthy pitfalls.  Facts on Fats  Clinical staff conducted group or individual video education with verbal and written material and guidebook.  Patient learns that lifestyle modifications can be just as effective, if not more so, as many medications for lowering your risk of heart disease. A Pritikin lifestyle can help to reduce your risk of inflammation and atherosclerosis (cholesterol build-up, or plaque, in the artery walls). Lifestyle interventions such as dietary choices and physical activity address the cause of atherosclerosis. A review of the types of fats and their impact on blood cholesterol levels, along with dietary recommendations to reduce fat intake is also included.  Nutrition Action Plan  Clinical staff conducted group or individual video education with verbal and written material and guidebook.  Patient learns how to incorporate Pritikin recommendations into their lifestyle. Recommendations include planning and keeping personal health goals in mind as an important part of their success.  Healthy  Mind-Set    Healthy Minds, Bodies, Hearts  Clinical staff conducted group or individual video education with verbal and written material and guidebook.  Patient learns how to identify when they are stressed. Video will discuss the impact of that stress, as well as the many benefits of stress management. Patient will also be introduced to stress management techniques. The way we think, act, and feel has an impact on  our hearts.  How Our Thoughts Can Heal Our Hearts  Clinical staff conducted group or individual video education with verbal and written material and guidebook.  Patient learns that negative thoughts can cause depression and anxiety. This can result in negative lifestyle behavior and serious health problems. Cognitive behavioral therapy is an effective method to help control our thoughts in order to change and improve our emotional outlook.  Additional Videos:  Exercise    Improving Performance  Clinical staff conducted group or individual video education with verbal and written material and guidebook.  Patient learns to use a non-linear approach by alternating intensity levels and lengths of time spent exercising to help burn more calories and lose more body fat. Cardiovascular exercise helps improve heart health, metabolism, hormonal balance, blood sugar control, and recovery from fatigue. Resistance training improves strength, endurance, balance, coordination, reaction time, metabolism, and muscle mass. Flexibility exercise improves circulation, posture, and balance. Seek guidance from your physician and exercise physiologist before implementing an exercise routine and learn your capabilities and proper form for all exercise.  Introduction to Yoga  Clinical staff conducted group or individual video education with verbal and written material and guidebook.  Patient learns about yoga, a discipline of the coming together of mind, breath, and body. The benefits of yoga include improved  flexibility, improved range of motion, better posture and core strength, increased lung function, weight loss, and positive self-image. Yoga's heart health benefits include lowered blood pressure, healthier heart rate, decreased cholesterol and triglyceride levels, improved immune function, and reduced stress. Seek guidance from your physician and exercise physiologist before implementing an exercise routine and learn your capabilities and proper form for all exercise.  Medical   Aging: Enhancing Your Quality of Life  Clinical staff conducted group or individual video education with verbal and written material and guidebook.  Patient learns key strategies and recommendations to stay in good physical health and enhance quality of life, such as prevention strategies, having an advocate, securing a [place], and keeping a list of medications and system for tracking them. It also discusses how to avoid risk for bone loss.  Biology of Weight Control  Clinical staff conducted group or individual video education with verbal and written material and guidebook.  Patient learns that weight gain occurs because we consume more calories than we burn (eating more, moving less). Even if your body weight is normal, you may have higher ratios of fat compared to muscle mass. Too much body fat puts you at increased risk for cardiovascular disease, heart attack, stroke, type 2 diabetes, and obesity-related cancers. In addition to exercise, following the [place] can help reduce your risk.  Decoding Lab Results  Clinical staff conducted group or individual video education with verbal and written material and guidebook.  Patient learns that lab test reflects one measurement whose values change over time and are influenced by many factors, including medication, stress, sleep, exercise, food, hydration, pre-existing medical conditions, and more. It is recommended to use the knowledge  from this video to become more involved with your lab results and evaluate your numbers to speak with your doctor.   Diseases of Our Time - Overview  Clinical staff conducted group or individual video education with verbal and written material and guidebook.  Patient learns that according to the CDC, 50% to 70% of chronic diseases (such as obesity, type 2 diabetes, elevated lipids, hypertension, and heart disease) are avoidable through lifestyle improvements including healthier food  choices, listening to satiety cues, and increased physical activity.  Sleep Disorders Clinical staff conducted group or individual video education with verbal and written material and guidebook.  Patient learns how good quality and duration of sleep are important to overall health and well-being. Patient also learns about sleep disorders and how they impact health along with recommendations to address them, including discussing with a physician.  Nutrition  Dining Out - Part 2 Clinical staff conducted group or individual video education with verbal and written material and guidebook.  Patient learns how to plan ahead and communicate in order to maximize their dining experience in a healthy and nutritious manner. Included are recommended food choices based on the type of restaurant the patient is visiting.   Fueling a Best boy conducted group or individual video education with verbal and written material and guidebook.  There is a strong connection between our food choices and our health. Diseases like obesity and type 2 diabetes are very prevalent and are in large-part due to lifestyle choices. The Pritikin Eating Plan provides plenty of food and hunger-curbing satisfaction. It is easy to follow, affordable, and helps reduce health risks.  Menu Workshop  Clinical staff conducted group or individual video education with verbal and written material and guidebook.  Patient learns that restaurant  meals can sabotage health goals because they are often packed with calories, fat, sodium, and sugar. Recommendations include strategies to plan ahead and to communicate with the manager, chef, or server to help order a healthier meal.  Planning Your Eating Strategy  Clinical staff conducted group or individual video education with verbal and written material and guidebook.  Patient learns about the [place] and its benefit of reducing the risk of disease. The [place] does not focus on calories. Instead, it emphasizes high-quality, nutrient-rich foods. By knowing the characteristics of the foods, we choose, we can determine their calorie density and make informed decisions.  Targeting Your Nutrition Priorities  Clinical staff conducted group or individual video education with verbal and written material and guidebook.  Patient learns that lifestyle habits have a tremendous impact on disease risk and progression. This video provides eating and physical activity recommendations based on your personal health goals, such as reducing LDL cholesterol, losing weight, preventing or controlling type 2 diabetes, and reducing high blood pressure.  Vitamins and Minerals  Clinical staff conducted group or individual video education with verbal and written material and guidebook.  Patient learns different ways to obtain key vitamins and minerals, including through a recommended healthy diet. It is important to discuss all supplements you take with your doctor.   Healthy Mind-Set    Smoking Cessation  Clinical staff conducted group or individual video education with verbal and written material and guidebook.  Patient learns that cigarette smoking and tobacco addiction pose a serious health risk which affects millions of people. Stopping smoking will significantly reduce the risk of heart disease, lung disease, and many forms of cancer. Recommended strategies for quitting are covered,  including working with your doctor to develop a successful plan.  Culinary   Becoming a Financial trader conducted group or individual video education with verbal and written material and guidebook.  Patient learns that cooking at home can be healthy, cost-effective, quick, and puts them in control. Keys to cooking healthy recipes will include looking at your recipe, assessing your equipment needs, planning ahead, making it simple, choosing cost-effective seasonal ingredients, and limiting the use of  added fats, salts, and sugars.  Cooking - Breakfast and Snacks  Clinical staff conducted group or individual video education with verbal and written material and guidebook.  Patient learns how important breakfast is to satiety and nutrition through the entire day. Recommendations include key foods to eat during breakfast to help stabilize blood sugar levels and to prevent overeating at meals later in the day. Planning ahead is also a key component.  Cooking - Human resources officer conducted group or individual video education with verbal and written material and guidebook.  Patient learns eating strategies to improve overall health, including an approach to cook more at home. Recommendations include thinking of animal protein as a side on your plate rather than center stage and focusing instead on lower calorie dense options like vegetables, fruits, whole grains, and plant-based proteins, such as beans. Making sauces in large quantities to freeze for later and leaving the skin on your vegetables are also recommended to maximize your experience.  Cooking - Healthy Salads and Dressing Clinical staff conducted group or individual video education with verbal and written material and guidebook.  Patient learns that vegetables, fruits, whole grains, and legumes are the foundations of the [place]. Recommendations include how to incorporate each of these in flavorful and  healthy salads, and how to create homemade salad dressings. Proper handling of ingredients is also covered. Cooking - Soups and [employer] - Soups and Desserts Clinical staff conducted group or individual video education with verbal and written material and guidebook.  Patient learns that Pritikin soups and desserts make for easy, nutritious, and delicious snacks and meal components that are low in sodium, fat, sugar, and calorie density, while high in vitamins, minerals, and filling fiber. Recommendations include simple and healthy ideas for soups and desserts.   Overview     The Pritikin Solution Program Overview Clinical staff conducted group or individual video education with verbal and written material and guidebook.  Patient learns that the results of the [place] Program have been documented in more than 100 articles published in peer-reviewed journals, and the benefits include reducing risk factors for (and, in some cases, even reversing) high cholesterol, high blood pressure, type 2 diabetes, obesity, and more! An overview of the three key pillars of the Pritikin Program will be covered: eating well, doing regular exercise, and having a healthy mind-set.  WORKSHOPS  Exercise: Exercise Basics: Building Your Action Plan Clinical staff led group instruction and group discussion with PowerPoint presentation and patient guidebook. To enhance the learning environment the use of posters, models and videos may be added. At the conclusion of this workshop, patients will comprehend the difference between physical activity and exercise, as well as the benefits of incorporating both, into their routine. Patients will understand the FITT (Frequency, Intensity, Time, and Type) principle and how to use it to build an exercise action plan. In addition, safety concerns and other considerations for exercise and cardiac rehab will be addressed by the presenter. The purpose of this lesson is to  promote a comprehensive and effective weekly exercise routine in order to improve patients' overall level of fitness.   Managing Heart Disease: Your Path to a Healthier Heart Clinical staff led group instruction and group discussion with PowerPoint presentation and patient guidebook. To enhance the learning environment the use of posters, models and videos may be added.At the conclusion of this workshop, patients will understand the anatomy and physiology of the heart. Additionally, they will understand how Pritikin's  three pillars impact the risk factors, the progression, and the management of heart disease.  The purpose of this lesson is to provide a high-level overview of the heart, heart disease, and how the Pritikin lifestyle positively impacts risk factors.  Exercise Biomechanics Clinical staff led group instruction and group discussion with PowerPoint presentation and patient guidebook. To enhance the learning environment the use of posters, models and videos may be added. Patients will learn how the structural parts of their bodies function and how these functions impact their daily activities, movement, and exercise. Patients will learn how to promote a neutral spine, learn how to manage pain, and identify ways to improve their physical movement in order to promote healthy living. The purpose of this lesson is to expose patients to common physical limitations that impact physical activity. Participants will learn practical ways to adapt and manage aches and pains, and to minimize their effect on regular exercise. Patients will learn how to maintain good posture while sitting, walking, and lifting.  Balance Training and Fall Prevention  Clinical staff led group instruction and group discussion with PowerPoint presentation and patient guidebook. To enhance the learning environment the use of posters, models and videos may be added. At the conclusion of this workshop, patients will  understand the importance of their sensorimotor skills (vision, proprioception, and the vestibular system) in maintaining their ability to balance as they age. Patients will apply a variety of balancing exercises that are appropriate for their current level of function. Patients will understand the common causes for poor balance, possible solutions to these problems, and ways to modify their physical environment in order to minimize their fall risk. The purpose of this lesson is to teach patients about the importance of maintaining balance as they age and ways to minimize their risk of falling.  WORKSHOPS   Nutrition:  Fueling a Scientist, research (physical sciences) led group instruction and group discussion with PowerPoint presentation and patient guidebook. To enhance the learning environment the use of posters, models and videos may be added. Patients will review the foundational principles of the [place] and understand what constitutes a serving size in each of the food groups. Patients will also learn [facility]-friendly foods that are better choices when away from home and review make-ahead meal and snack options. Calorie density will be reviewed and applied to three nutrition priorities: weight maintenance, weight loss, and weight gain. The purpose of this lesson is to reinforce (in a group setting) the key concepts around what patients are recommended to eat and how to apply these guidelines when away from home by planning and selecting [facility]-friendly options. Patients will understand how calorie density may be adjusted for different weight management goals.  Mindful Eating  Clinical staff led group instruction and group discussion with PowerPoint presentation and patient guidebook. To enhance the learning environment the use of posters, models and videos may be added. Patients will briefly review the concepts of the [place] and the importance of low-calorie dense foods. The  concept of mindful eating will be introduced as well as the importance of paying attention to internal hunger signals. Triggers for non-hunger eating and techniques for dealing with triggers will be explored. The purpose of this lesson is to provide patients with the opportunity to review the basic principles of the [place], discuss the value of eating mindfully and how to measure internal cues of hunger and fullness using the Hunger Scale. Patients will also discuss reasons for non-hunger eating and  learn strategies to use for controlling emotional eating.  Targeting Your Nutrition Priorities Clinical staff led group instruction and group discussion with PowerPoint presentation and patient guidebook. To enhance the learning environment the use of posters, models and videos may be added. Patients will learn how to determine their genetic susceptibility to disease by reviewing their family history. Patients will gain insight into the importance of diet as part of an overall healthy lifestyle in mitigating the impact of genetics and other environmental insults. The purpose of this lesson is to provide patients with the opportunity to assess their personal nutrition priorities by looking at their family history, their own health history and current risk factors. Patients will also be able to discuss ways of prioritizing and modifying the [place] for their highest risk areas  Menu  Clinical staff led group instruction and group discussion with PowerPoint presentation and patient guidebook. To enhance the learning environment the use of posters, models and videos may be added. Using menus brought in from [employer], or printed from [employer], patients will apply the [place] dining out guidelines that were presented in the [employer] video. Patients will also be able to practice these guidelines in a variety of provided scenarios. The purpose of this lesson  is to provide patients with the opportunity to practice hands-on learning of the [place] with actual menus and practice scenarios.  Label Reading Clinical staff led group instruction and group discussion with PowerPoint presentation and patient guidebook. To enhance the learning environment the use of posters, models and videos may be added. Patients will review and discuss the [facility] label reading guidelines presented in [facility]'s Label Reading Educational series video. Using fool labels brought in from local grocery stores and markets, patients will apply the label reading guidelines and determine if the packaged food meet the [facility] guidelines. The purpose of this lesson is to provide patients with the opportunity to review, discuss, and practice hands-on learning of the [facility] Label Reading guidelines with actual packaged food labels. [place] Workshops are designed to teach patients ways to prepare quick, simple, and affordable recipes at home. The importance of nutrition's role in chronic disease risk reduction is reflected in its emphasis in the overall [facility] program. By learning how to prepare essential core [facility] Eating Plan recipes, patients will increase control over what they eat; be able to customize the flavor of foods without the use of added salt, sugar, or fat; and improve the quality of the food they consume. By learning a set of core recipes which are easily assembled, quickly prepared, and affordable, patients are more likely to prepare more healthy foods at home. These workshops focus on convenient breakfasts, simple entres, side dishes, and desserts which can be prepared with minimal effort and are consistent with nutrition recommendations for cardiovascular risk reduction. Cooking [employer] are taught by a Engineer, materials (RD) who has been trained by the [employer]. The chef or RD has  a clear understanding of the importance of minimizing - if not completely eliminating - added fat, sugar, and sodium in recipes. Throughout the series of [place] Workshop sessions, patients will learn about healthy ingredients and efficient methods of cooking to build confidence in their capability to prepare    Cooking School weekly topics:  Adding Flavor- Sodium-Free  Fast and Healthy Breakfasts  Powerhouse Plant-Based Proteins  Satisfying Salads and Dressings  Simple Sides and Sauces  International Cuisine-Spotlight on the [place]  [employer] - Meals in a Snap  Tasty Appetizers and Snacks  Comforting Weekend Breakfasts  One-Pot Wonders   Fast [employer] Your [facility]in Plate  WORKSHOPS   Healthy Mindset (Psychosocial): New Thoughts, New Behaviors Clinical staff led group instruction and group discussion with PowerPoint presentation and patient guidebook. To enhance the learning environment the use of posters, models and videos may be added. Patients will learn and practice techniques for developing effective health and lifestyle goals. Patients will be able to effectively apply the goal setting process learned to develop at least one new personal goal.  The purpose of this lesson is to expose patients to a new skill set of behavior modification techniques such as techniques setting SMART goals, overcoming barriers, and achieving new thoughts and new behaviors.  Managing Moods and Relationships Clinical staff led group instruction and group discussion with PowerPoint presentation and patient guidebook. To enhance the learning environment the use of posters, models and videos may be added. Patients will learn how emotional and chronic stress factors can impact their health and relationships. They will learn healthy ways to manage their moods and utilize positive coping mechanisms. In addition, ICR  patients will learn ways to improve communication skills. The purpose of this lesson is to expose patients to ways of understanding how one's mood and health are intimately connected. Developing a healthy outlook can help build positive relationships and connections with others. Patients will understand the importance of utilizing effective communication skills that include actively listening and being heard. They will learn and understand the importance of the "4 Cs" and especially Connections in fostering of a Healthy Mind-Set.  Healthy Sleep for a Healthy Heart Clinical staff led group instruction and group discussion with PowerPoint presentation and patient guidebook. To enhance the learning environment the use of posters, models and videos may be added. At the conclusion of this workshop, patients will be able to demonstrate knowledge of the importance of sleep to overall health, well-being, and quality of life. They will understand the symptoms of, and treatments for, common sleep disorders. Patients will also be able to identify daytime and nighttime behaviors which impact sleep, and they will be able to apply these tools to help manage sleep-related challenges. The purpose of this lesson is to provide patients with a general overview of sleep and outline the importance of quality sleep. Patients will learn about a few of the most common sleep disorders. Patients will also be introduced to the concept of "sleep hygiene," and discover ways to self-manage certain sleeping problems through simple daily behavior changes. Finally, the workshop will motivate patients by clarifying the links between quality sleep and their goals of heart-healthy living.   Recognizing and Reducing Stress Clinical staff led group instruction and group discussion with PowerPoint presentation and patient guidebook. To enhance the learning environment the use of posters, models and videos may be added. At the conclusion of this  workshop, patients will be able to understand the types of stress reactions, differentiate between acute and chronic stress, and recognize the impact that chronic stress has on their health. They will also be able to apply different coping mechanisms, such as reframing negative self-talk. Patients will have the opportunity to practice a variety of stress management techniques, such as deep abdominal breathing, progressive muscle relaxation, and/or guided imagery.  The purpose of this lesson is to educate patients on the role of  stress in their lives and to provide healthy techniques for coping with it.  Learning Barriers/Preferences:  Learning Barriers/Preferences - 08/05/21 1408       Learning Barriers/Preferences   Learning Barriers Sight   wears glasses   Learning Preferences Computer/Internet;Skilled Demonstration;Video;Written Material;Group Instruction;Individual Instruction;Pictoral             Education Topics:  Knowledge Questionnaire Score:  Knowledge Questionnaire Score - 08/05/21 1409       Knowledge Questionnaire Score   Pre Score 19/24             Core Components/Risk Factors/Patient Goals at Admission:  Personal Goals and Risk Factors at Admission - 08/05/21 1411       Core Components/Risk Factors/Patient Goals on Admission    Weight Management Weight Maintenance    Lipids Yes    Intervention Provide education and support for participant on nutrition & aerobic/resistive exercise along with prescribed medications to achieve LDL '70mg'$ , HDL >'40mg'$ .    Expected Outcomes Short Term: Participant states understanding of desired cholesterol values and is compliant with medications prescribed. Participant is following exercise prescription and nutrition guidelines.;Long Term: Cholesterol controlled with medications as prescribed, with individualized exercise RX and with personalized nutrition plan. Value goals: LDL < '70mg'$ , HDL > 40 mg.             Core  Components/Risk Factors/Patient Goals Review:   Goals and Risk Factor Review     Row Name 08/12/21 0903 09/09/21 1737           Core Components/Risk Factors/Patient Goals Review   Personal Goals Review Weight Management/Obesity;Lipids Weight Management/Obesity;Lipids      Review Beverly [name] started cardiac rehab on 08/11/21. Beverly [name] did well with exercise. Vital signs were stable. Beverly [name] has been doing well with exercise at intensive cardiac rehab and has not reported any more chest pain or indigestion during exercise since her ED visit.      Expected Outcomes Beverly [name] will continue to participate inphase 2 cardiac rehab for exercise, nutrition and lifestyel modifications Beverly [name] will continue to participate intensive cardiac rehab for exercise, nutrition and lifestyel modifications               Core Components/Risk Factors/Patient Goals at Discharge (Final Review):   Goals and Risk Factor Review - 09/09/21 1737       Core Components/Risk Factors/Patient Goals Review   Personal Goals Review Weight Management/Obesity;Lipids    Review Beverly [name] has been doing well with exercise at intensive cardiac rehab and has not reported any more chest pain or indigestion during exercise since her ED visit.    Expected Outcomes Beverly [name] will continue to participate intensive cardiac rehab for exercise, nutrition and lifestyel modifications             ITP Comments:  ITP Comments     Row Name 08/05/21 1218 08/12/21 0846 09/09/21 1735       ITP Comments Dr Fransico Him MD, Medical Director, Introduction to [facility] Education/ Intensive Cardiac Rehab Program. Initial [facility] Orientation Packet reviewed with the patient. 30 Day ITP Review. Denis started Intensive Cardiac Rehab on 08/11/21. Dazha did well with exercise. 30 Day ITP Review. Kharlie has good attendance and participation in intensive cardiac rehab.              Comments: See ITP comments.Harrell Gave RN BSN

## 2021-09-10 ENCOUNTER — Encounter (HOSPITAL_COMMUNITY)
Admission: RE | Admit: 2021-09-10 | Discharge: 2021-09-10 | Disposition: A | Discharge status: Home / Self Care | Payer: Medicare PPO | Source: Ambulatory Visit | Attending: Cardiovascular Disease | Admitting: Cardiovascular Disease

## 2021-09-10 DIAGNOSIS — Z955 Presence of coronary angioplasty implant and graft: Secondary | ICD-10-CM

## 2021-09-11 ENCOUNTER — Encounter: Payer: Self-pay | Admitting: Internal Medicine

## 2021-09-11 ENCOUNTER — Other Ambulatory Visit (HOSPITAL_COMMUNITY): Payer: Self-pay

## 2021-09-12 ENCOUNTER — Encounter (HOSPITAL_COMMUNITY)
Admission: RE | Admit: 2021-09-12 | Discharge: 2021-09-12 | Disposition: A | Discharge status: Home / Self Care | Payer: Medicare PPO | Source: Ambulatory Visit | Attending: Cardiovascular Disease | Admitting: Cardiovascular Disease

## 2021-09-12 ENCOUNTER — Other Ambulatory Visit (HOSPITAL_COMMUNITY): Payer: Self-pay

## 2021-09-12 DIAGNOSIS — Z955 Presence of coronary angioplasty implant and graft: Secondary | ICD-10-CM

## 2021-09-12 MED ORDER — BELBUCA 750 MCG BU FILM
ORAL_FILM | BUCCAL | 2 refills | Status: DC
  Filled 2021-09-12: qty 60, 30d supply, fill #0
  Filled 2021-10-19: qty 60, 30d supply, fill #1
  Filled 2021-11-25: qty 60, 30d supply, fill #2

## 2021-09-15 ENCOUNTER — Encounter (HOSPITAL_COMMUNITY)
Admission: RE | Admit: 2021-09-15 | Discharge: 2021-09-15 | Disposition: A | Discharge status: Home / Self Care | Payer: Medicare PPO | Source: Ambulatory Visit | Attending: Cardiovascular Disease | Admitting: Cardiovascular Disease

## 2021-09-15 ENCOUNTER — Other Ambulatory Visit (HOSPITAL_COMMUNITY): Payer: Self-pay

## 2021-09-15 DIAGNOSIS — Z955 Presence of coronary angioplasty implant and graft: Secondary | ICD-10-CM

## 2021-09-16 ENCOUNTER — Ambulatory Visit: Payer: Medicare PPO | Admitting: Neurology

## 2021-09-16 ENCOUNTER — Encounter: Payer: Self-pay | Admitting: Neurology

## 2021-09-16 VITALS — BP 132/63 | HR 60 | Ht 64.0 in | Wt 144.5 lb

## 2021-09-16 DIAGNOSIS — L74511 Primary focal hyperhidrosis, face: Secondary | ICD-10-CM

## 2021-09-16 DIAGNOSIS — G453 Amaurosis fugax: Secondary | ICD-10-CM

## 2021-09-16 NOTE — Progress Notes (Signed)
[facility]  PATIENT: Beverly [name] DOB: December 12, 1945  REQUESTING CLINICIAN: Neldon Mc Donnamarie Poag, MD HISTORY FROM:  Patient  REASON FOR VISIT: Right facial hyperhidrosis   HISTORICAL  CHIEF COMPLAINT:  Chief Complaint  Patient presents with   New Patient (Initial Visit)    Rm 15. Alone. NP/Internal referral for facial unilateral dyshidrosis. States right pupil does not react to light. In 2018 she reports loss of vision in right eye, has dissipated since beginning aspirin 81 mg.    HISTORY OF PRESENT ILLNESS:  This is a 76 year old woman who with multiple medical conditions including common variable immunodeficiency, hypertension, basal cell carcinoma, coronary artery disease, depression, Bowen disease, 3 episodes of Amaurosis fugax with negative work-up who is presenting with excessive sweating of the right side of the face since 2015.  Patient report starting 2015 she noted that the right side of her face will swell profusely, this is not associated with her eating , or during workout or anything else; this is happening spontaneously.  She did not think anything about it until she discussed with her Allergist who advised her to look at her pupil size during one of these episodes. During one episode, she noted that the right pupil was not dilated while the left was dilated.  Due to that she was referred to neurology.  Her other neurological complaints also include a history of amaurosis fugax 3 time, with a negative work-up.  The first episode was associated with vision loss in the right eyes lasting about 10-minutes.   Patient also reported history of right-sided Bell's palsy in in the 1970s.  She reported it took about 9 months to recover fully from the Bell's palsy.  Again denies any cough without tears, denies any synkinesis and denies any right side right facial swelling while eating.      OTHER MEDICAL CONDITIONS: Common variable immunodeficiency, Hypertension,  Basal cell carcinoma, coronary artery disease, depression, Bowen disease   REVIEW OF SYSTEMS: Full 14 system review of systems performed and negative with exception of: as noted in the HPI   ALLERGIES: Allergies  Allergen Reactions   Plavix [Clopidogrel] Rash   Adhesive [Tape] Rash   Codeine Rash    HOME MEDICATIONS: Outpatient Medications Prior to Visit  Medication Sig Dispense Refill   acetaminophen (TYLENOL) 500 MG tablet Take 1,000 mg by mouth every 6 (six) hours as needed for moderate pain.      albuterol (VENTOLIN HFA) 108 (90 Base) MCG/ACT inhaler Inhale 2 puffs into the lungs every 6 (six) hours as needed for wheezing or shortness of breath. 18 g 1   aspirin EC 81 MG tablet Take 81 mg by mouth daily.     atorvastatin (LIPITOR) 40 MG tablet Take 1 tablet by mouth daily. 90 tablet 1   budesonide (ENTOCORT EC) 3 MG 24 hr capsule Take 3 capsules (9 mg total) by mouth daily for 28 days, THEN 2 capsules (6 mg total) daily for 56 days, THEN 1 capsule (3 mg total) daily. 252 capsule 0   Buprenorphine HCl (BELBUCA) 750 MCG FILM Place 1 film to inside of mouth every 12 hours 60 each 2   Calcium Carbonate-Vitamin D 600-200 MG-UNIT TABS Take 1 tablet by mouth 2 (two) times daily.     celecoxib (CELEBREX) 200 MG capsule TAKE 1 CAPSULE BY MOUTH 2 TIMES DAILY 180 capsule 1   colestipol (COLESTID) 1 g tablet Take 1 tablet by mouth daily. 90 tablet 0   diclofenac Sodium (VOLTAREN) 1 %  GEL Apply 2 g topically 4 (four) times daily as needed (joint pain).     Docusate Sodium 100 MG capsule Take 100 mg by mouth daily.     EPINEPHrine 0.3 mg/0.3 mL IJ SOAJ injection Inject 0.3 mg into the muscle as needed for anaphylaxis.     famotidine (PEPCID) 40 MG tablet Take 1 tablet by mouth at bedtime. 90 tablet 2   Fluorouracil (TOLAK) 4 % CREA Apply to aa total 28 applications & sun protect 40 g 0   gabapentin (NEURONTIN) 800 MG tablet Take 1 tablet by mouth at bedtime. 90 tablet 2   guaiFENesin (MUCINEX)  600 MG 12 hr tablet Take 600 mg by mouth 2 (two) times daily as needed for cough or to loosen phlegm.     HYDROcodone-acetaminophen (NORCO) 7.5-325 MG tablet Take 1 tablet by mouth four times a day as needed for pain 100 tablet 0   ipratropium (ATROVENT) 0.03 % nasal spray Place 1 spray into both nostrils 3 (three) times daily. 30 mL 5   Lactobacillus-Inulin (CULTURELLE ADULT ULT BALANCE) CAPS Take 1 capsule by mouth daily.     levothyroxine (SYNTHROID) 25 MCG tablet Take 1 tablet (25 mcg total) by mouth daily. 90 tablet 1   loratadine (CLARITIN) 10 MG tablet Take 1 tablet (10 mg total) by mouth daily as needed for allergies (Can take an extra dose during flare ups.). 60 tablet 5   Melatonin-Pyridoxine (MELATIN PO) Take 5 mg by mouth at bedtime.     methocarbamol (ROBAXIN) 500 MG tablet Take 1 tablet by mouth three times a day as needed 270 tablet 2   montelukast (SINGULAIR) 10 MG tablet Take 1 tablet (10 mg total) by mouth at bedtime. 90 tablet 1   Multiple Vitamins-Minerals (MULTIVITAMIN WITH MINERALS) tablet Take 1 tablet by mouth daily.     nitroGLYCERIN (NITROSTAT) 0.4 MG SL tablet Place 1 tablet (0.4 mg total) under the tongue every 5 (five) minutes as needed. 25 tablet 2   omega-3 acid ethyl esters (LOVAZA) 1 g capsule TAKE 2 CAPSULES BY MOUTH 2 TIMES DAILY 360 capsule 0   ondansetron (ZOFRAN-ODT) 4 MG disintegrating tablet Take 1 tablet by mouth every 6 (six) hours as needed for nausea or vomiting. 30 tablet 1   pantoprazole (PROTONIX) 40 MG tablet Take 1 tablet (40 mg total) by mouth daily with breakfast. 90 tablet 3   PARoxetine (PAXIL) 30 MG tablet Take 1 tablet by mouth once a day. 90 tablet 1   ticagrelor (BRILINTA) 90 MG TABS tablet Take 1 tablet by mouth 2 times daily. (stop Plavix (clopidogrel)) 180 tablet 3   Vitamin D, Ergocalciferol, (DRISDOL) 1.25 MG (50000 UNIT) CAPS capsule Take 1 capsule (50,000 Units total) by mouth every 7 (seven) days for 12 doses. 12 capsule 0   XEMBIFY 10  GM/50ML SOLN Inject 10 g into the skin once a week.     ferrous sulfate 324 MG TBEC Take 324 mg by mouth daily with breakfast.     losartan (COZAAR) 25 MG tablet Take 1/2 tablet (12.5 mg total) by mouth daily. (Patient not taking: Reported on 09/08/2021) 90 tablet 0   Misc Natural Products (FIBER 7 PO) Take 2 capsules by mouth daily.     No facility-administered medications prior to visit.    PAST MEDICAL HISTORY: Past Medical History:  Diagnosis Date   Allergy    Amaurosis fugax    negative w/u through [facility] right eye   Asthma    no attacks in  several yrs per pt on 03-03-2021   Back pain    Dr Joline Salt 02/2010-epidural injection x 2 at L4-5 with good effect   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   right sholder   BCC (basal cell carcinoma of skin) 05/09/2014   mid lower back   BCC (basal cell carcinoma of skin) 05/03/2017   right low back   BCC (basal cell carcinoma of skin) 05/03/2017   left upper back   BCC (basal cell carcinoma of skin) 07/05/2018   left mid back   BCC (basal cell carcinoma of skin) 05/20/1992   upper back   BCC (basal cell carcinoma of skin) 07/29/1993   left sholder medial   BCC (basal cell carcinoma of skin) 07/29/1993   left sholder lateral   BCC (basal cell carcinoma of skin) 07/29/1993   right thigh   BCC (basal cell carcinoma of skin) 07/29/1993   right sholder   BCC (basal cell carcinoma of skin) 12/22/1994   right mid forearm   BCC (basal cell carcinoma of skin) 12/22/1994   right upper forearm   BCC (basal cell carcinoma of skin) 12/22/1994   lower right upper arm   BCC (basal cell carcinoma of skin) 12/22/1994   right upper arm sholder   BCC (basal cell carcinoma of skin) 08/11/1995   left leg below knee   BCC (basal cell carcinoma of skin) 04/11/2002   mid back   BCC (basal cell carcinoma of skin) 12/10/2002   right center upper back   BCC (basal cell carcinoma of skin) 05/26/2005   right post sholder   BCC (basal cell carcinoma)  05/09/2014   left inner shin   BCC (basal cell carcinoma) 06/12/2014   left forearm   Bowen's disease 10/07/1994   right post knee, right inner forearm/wrist   Bowen's disease 08/11/1995   left sholder   Bronchiectasis ([place])    Cataract    left   Chronic pain    Common variable immunodeficiency [facility])    sees dr Gayleen Orem 02-11-2021 epic   Coronary artery disease    Depression    hx of   Diverticulosis of colon 1998   mild   DJD (degenerative joint disease)    Duodenal ulcer    h/o yrs ago   GERD (gastroesophageal reflux disease)    History of SCC (squamous cell carcinoma) of skin    Dr. Denna Haggard   History of sinus bradycardia    HLD (hyperlipidemia)    hypertriglyceridemia   Hypertensive retinopathy of both eyes 11/18/2017   Hypothyroidism    IBS (irritable bowel syndrome)    Internal hemorrhoids 1998   Lymphocytic colitis    Mitral regurgitation    mild   Nocardiosis    relased by infection disease dec 2022   Osteoarthritis    feet,shoulder,neck,back,hips and hands.   Pneumonia 2015   PONV (postoperative nausea and vomiting)    Rotator cuff tear, right 02/2019   infraspinatus and supraspinatus, and dislocation of long head of bicep tendon (Dr. Alvan Dame)   SCC (squamous cell carcinoma) 11/26/2014   left hand, right hand, right deltoid mnay areas   SCC (squamous cell carcinoma) 05/03/2017   left cheek   Sleep apnea    uses a mouth guard nightly   Squamous cell carcinoma in situ (SCCIS) 07/05/2018   left hand   Tracheobronchomalacia    Vitamin D deficiency    mild    PAST SURGICAL HISTORY: Past Surgical History:  Procedure Laterality Date   ABDOMINAL  HYSTERECTOMY  1998   complete   BLEPHAROPLASTY Bilateral 01/2018   BROW LIFT Bilateral 10/10/2018   Procedure: BROW LIFT;  Surgeon: Wallace Going, DO;  Location: [place];  Service: Plastics;  Laterality: Bilateral;   BUNIONECTOMY     R 12/08, L 2004 (Dr. Janus Molder)   [place]   bilateral   CARPAL TUNNEL RELEASE Left 03/06/2021   Procedure: Left Revision Carpal Tunnel Release with hypothenar fat pad flap;  Surgeon: [name], MD;  Location: [facility];  Service: Orthopedics;  Laterality: Left;  with local anesthesia   CATARACT EXTRACTION Bilateral Left in 08/2012, Right 04/2016   Dr.Hecker   CESAREAN SECTION     1981 and [place]   laparoscopic   CLOSED REDUCTION NASAL FRACTURE N/A 08/29/2018   Procedure: CLOSED REDUCTION NASAL FRACTURE;  Surgeon: Wallace Going, DO;  Location: [facility];  Service: Plastics;  Laterality: N/A;  1 hour, please   COLONOSCOPY  2006   COLONOSCOPY  01/2012   due again 01/2021; mild diverticulosis   CORONARY STENT INTERVENTION N/A 05/19/2021   Procedure: CORONARY STENT INTERVENTION;  Surgeon: Nelva Bush, MD;  Location: [facility];  Service: Cardiovascular;  Laterality: N/A;   epidural steroid injection, back  02/2010   HIP SURGERY     right bursectomy x 3   HIP SURGERY Right 2000   torn cartilage, repaired   INGUINAL HERNIA REPAIR  10/2007   bilat   INTRAVASCULAR ULTRASOUND/IVUS N/A 05/19/2021   Procedure: Intravascular Ultrasound/IVUS;  Surgeon: Nelva Bush, MD;  Location: [facility];  Service: Cardiovascular;  Laterality: N/A;   LEFT HEART CATH AND CORONARY ANGIOGRAPHY N/A 05/19/2021   Procedure: LEFT HEART CATH AND CORONARY ANGIOGRAPHY;  Surgeon: Nelva Bush, MD;  Location: [facility] [facility];  Service: Cardiovascular;  Laterality: N/A;   NECK SURGERY  1989   c6-7 cervical laminectomy and diskecotmy   REVERSE SHOULDER ARTHROPLASTY Right 04/11/2019   Procedure: REVERSE SHOULDER ARTHROPLASTY;  Surgeon: Nicholes Stairs, MD;  Location: [facility];  Service: Orthopedics;  Laterality: Right;  [place]   SHOULDER SURGERY Left 03/2003   left rotator cuff repair   SHOULDER SURGERY Left  03/14/2018   rotator cuff repair; Dr. Tonita Cong   TONSILLECTOMY  age 63   [place] Right [place] EXTRACTION     as teenager    FAMILY HISTORY: Family History  Problem Relation Age of Onset   Depression Mother    Schizophrenia Mother    Cancer Father        oral   Heart disease Father        bradycardia   Esophageal cancer Father    Cancer Sister        skin and lung   COPD Sister    Hypertension Sister    Osteoporosis Sister        compression fx's x 3 11/2018   Hyperthyroidism Brother    Cancer Brother        metastatic cancer to bone (?primary)   Diabetes Maternal Grandmother    Cancer Paternal Grandmother 36       colon cancer, metastatic to liver   Colon cancer Paternal Grandmother    Liver cancer Paternal Grandmother    Hyperlipidemia Daughter    Asthma Son    ADD / ADHD  Other     SOCIAL HISTORY: Social History   Socioeconomic History   Marital status: Divorced    Spouse name: Not on file   Number of children: 2   Years of education: Not on file   Highest education level: Master's degree (e.g., MA, MS, MEng, MEd, MSW, MBA)  Occupational History   Occupation: STAFF Optician, dispensing: [employer]   Occupation: Retired  Tobacco Use   Smoking status: Never   Smokeless tobacco: Never  Vaping Use   Vaping Use: Never used  Substance and Sexual Activity   Alcohol use: Yes    Alcohol/week: 7.0 standard drinks of alcohol    Types: 7 Glasses of wine per week    Comment: 1 glass wine per day   Drug use: No   Sexual activity: Not Currently  Other Topics Concern   Not on file  Social History Narrative   Divorced, lives alone, [place]; 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   [place] for [employer] part-time   Social Determinants of Health   Financial Resource Strain:  [place]  (05/17/2021)   Overall Financial Resource Strain (CARDIA)    Difficulty of Paying Living Expenses: Not hard at all  Food Insecurity: No [place] (05/17/2021)   Hunger Vital Sign    Worried About Running Out of Food in the Last Year: Never true    [place] in the Last Year: Never true  Transportation Needs: No Transportation Needs (05/17/2021)   PRAPARE - Hydrologist (Medical): No    Lack of Transportation (Non-Medical): No  Physical Activity: Insufficiently Active (05/17/2021)   Exercise Vital Sign    Days of Exercise per Week: 1 day    Minutes of Exercise per Session: 10 min  Stress: No Stress Concern Present (05/17/2021)   [place]    Feeling of Stress : Not at all  Social Connections: Moderately Integrated (05/17/2021)   Social Connection and Isolation Panel [NHANES]    Frequency of Communication with Friends and Family: More than three times a week    Frequency of Social Gatherings with Friends and Family: More than three times a week    Attends Religious Services: More than 4 times per year    Active Member of [employer] or Organizations: Yes    Attends Archivist Meetings: More than 4 times per year    Marital Status: Divorced  Intimate Partner Violence: Not At Risk (02/04/2021)   Humiliation, Afraid, Rape, and Kick questionnaire    Fear of Current or Ex-Partner: No    Emotionally Abused: No    Physically Abused: No    Sexually Abused: No    PHYSICAL EXAM  GENERAL EXAM/CONSTITUTIONAL: Vitals:  Vitals:   09/16/21 1041  BP: 132/63  Pulse: 60  Weight: 144 lb 8 oz (65.5 kg)  Height: '5\' 4"'$  (1.626 m)   Body mass index is 24.8 kg/m. Wt Readings from Last 3 Encounters:  09/16/21 144 lb 8 oz (65.5 kg)  08/26/21 144 lb (65.3 kg)  08/21/21 144 lb 3.2 oz (65.4 kg)   Patient is in no distress; well developed, nourished and groomed; neck is  supple  EYES: Pupils round and reactive to light, Visual fields full to confrontation, Extraocular movements intacts,   MUSCULOSKELETAL: Gait, strength, tone, movements noted  in Neurologic exam below  NEUROLOGIC: MENTAL STATUS:      No data to display         awake, alert, oriented to person, place and time recent and remote memory intact normal attention and concentration language fluent, comprehension intact, naming intact fund of knowledge appropriate  CRANIAL NERVE:  2nd, 3rd, 4th, 6th - pupils equal and reactive to light, visual fields full to confrontation, extraocular muscles intact, no nystagmus 5th - facial sensation symmetric 7th - facial strength symmetric 8th - hearing intact 9th - palate elevates symmetrically, uvula midline 11th - shoulder shrug symmetric 12th - tongue protrusion midline  MOTOR:  normal bulk and tone, full strength in the BUE, BLE  SENSORY:  normal and symmetric to light touch, pinprick, temperature, vibration  COORDINATION:  finger-nose-finger, fine finger movements normal  REFLEXES:  deep tendon reflexes present and symmetric  GAIT/STATION:  normal   DIAGNOSTIC DATA (LABS, IMAGING, TESTING) - I reviewed patient records, labs, notes, testing and imaging myself where available.  Lab Results  Component Value Date   WBC 5.0 08/20/2021   HGB 12.6 08/20/2021   HCT 37.4 08/20/2021   MCV 98.2 08/20/2021   PLT 156 08/20/2021      Component Value Date/Time   NA 140 08/20/2021 0943   NA 142 06/03/2021 1244   K 4.2 08/20/2021 0943   CL 107 08/20/2021 0943   CO2 25 08/20/2021 0943   GLUCOSE 77 08/20/2021 0943   BUN 12 08/20/2021 0943   BUN 11 06/03/2021 1244   CREATININE 0.66 08/20/2021 0943   CREATININE 0.73 02/04/2021 1122   CALCIUM 9.1 08/20/2021 0943   PROT 6.6 11/20/2020 1630   PROT 6.3 01/11/2019 0815   ALBUMIN 4.0 11/20/2020 1630   ALBUMIN 4.4 01/11/2019 0815   AST 34 11/20/2020 1630   ALT 29 11/20/2020 1630    ALKPHOS 84 11/20/2020 1630   BILITOT 0.3 11/20/2020 1630   BILITOT 0.3 01/11/2019 0815   GFRNONAA >60 08/20/2021 0943   GFRAA >60 06/22/2019 0917   Lab Results  Component Value Date   CHOL 137 11/20/2020   HDL 40.90 11/20/2020   LDLCALC 70 11/20/2020   TRIG 129.0 11/20/2020   CHOLHDL 3 11/20/2020   No results found for: "HGBA1C" Lab Results  Component Value Date   FUXNATFT73 220 11/20/2020   Lab Results  Component Value Date   TSH 1.33 08/12/2021    MRI Brain 11/06/21 Normal MRI appearance of the brain for age.     ASSESSMENT AND PLAN  76 y.o. year old female with Common variable immunodeficiency, Hypertension, Basal cell carcinoma, Coronary artery disease, Depression, Bowen disease who is presenting with 8-year history of right facial hyperhidrosis.  Patient reports the reason why she is seeking medical attention is that episode was associated with right pupillary dilation.  On exam today her pupil normal size, she did not have any episodes of hyperhidrosis also. However she reports a history of right-sided Bell's palsy.  Due to her history of amaurosis fugax also affecting the right eye I will go ahead and obtain a CT angiogram head and neck but if normal I will consider the right sided hyperhidrosis is a residual of Bell's palsy.  She however does not report any synkinesis or any crocodile tears (eye watering during eating). Follow is worse    1. Amaurosis fugax of right eye   2. Hyperhidrosis of face      Patient Instructions  CT angiogram head and neck We will contact you  to go over the result, if negative will consider to right-sided hyperhidrosis as a residual effect of her right-sided Bell's palsy Continue to follow-up with your PCP Return if worse.  Orders Placed This Encounter  Procedures   CT ANGIO HEAD W OR WO CONTRAST   CT ANGIO NECK W OR WO CONTRAST    No orders of the defined types were placed in this encounter.   Return if symptoms worsen or fail  to improve.  I have spent a total of 50 minutes dedicated to this patient today, preparing to see patient, performing a medically appropriate examination and evaluation, ordering tests and/or medications and procedures, and counseling and educating the patient/family/caregiver; independently interpreting result and communicating results to the family/patient/caregiver; and documenting clinical information in the electronic medical record.   Alric Ran, MD 09/16/2021, 10:01 PM  [facility] [address], [place], [place] [address] [phone]

## 2021-09-16 NOTE — Patient Instructions (Addendum)
CT angiogram head and neck We will contact you to go over the result, if negative will consider to right-sided hyperhidrosis as a residual effect of her right-sided Bell's palsy Continue to follow-up with your PCP Return if worse.

## 2021-09-17 ENCOUNTER — Encounter (HOSPITAL_COMMUNITY)
Admission: RE | Admit: 2021-09-17 | Discharge: 2021-09-17 | Disposition: A | Discharge status: Home / Self Care | Payer: Medicare PPO | Source: Ambulatory Visit | Attending: Cardiovascular Disease | Admitting: Cardiovascular Disease

## 2021-09-17 DIAGNOSIS — Z955 Presence of coronary angioplasty implant and graft: Secondary | ICD-10-CM | POA: Diagnosis not present

## 2021-09-17 DIAGNOSIS — Z48812 Encounter for surgical aftercare following surgery on the circulatory system: Secondary | ICD-10-CM | POA: Diagnosis not present

## 2021-09-18 DIAGNOSIS — J479 Bronchiectasis, uncomplicated: Secondary | ICD-10-CM | POA: Diagnosis not present

## 2021-09-19 ENCOUNTER — Encounter (HOSPITAL_COMMUNITY)
Admission: RE | Admit: 2021-09-19 | Discharge: 2021-09-19 | Disposition: A | Discharge status: Home / Self Care | Payer: Medicare PPO | Source: Ambulatory Visit | Attending: Cardiovascular Disease | Admitting: Cardiovascular Disease

## 2021-09-19 DIAGNOSIS — Z48812 Encounter for surgical aftercare following surgery on the circulatory system: Secondary | ICD-10-CM | POA: Diagnosis not present

## 2021-09-19 DIAGNOSIS — Z955 Presence of coronary angioplasty implant and graft: Secondary | ICD-10-CM

## 2021-09-22 ENCOUNTER — Encounter (HOSPITAL_COMMUNITY): Payer: Medicare PPO

## 2021-09-22 ENCOUNTER — Telehealth: Payer: Self-pay | Admitting: Neurology

## 2021-09-22 NOTE — Telephone Encounter (Signed)
CTA neck [employer] Josem Kaufmann: [id number] exp. 09/22/21-10/22/21 sent to GI CTA head [employer] medic[employer] auth: [id number] exp. 09/22/21-10/22/21 sent to GI

## 2021-09-24 ENCOUNTER — Encounter (HOSPITAL_COMMUNITY): Payer: Medicare PPO

## 2021-09-26 ENCOUNTER — Encounter (HOSPITAL_COMMUNITY): Payer: Medicare PPO

## 2021-09-29 ENCOUNTER — Encounter: Payer: Self-pay | Admitting: Dermatology

## 2021-09-29 ENCOUNTER — Encounter (HOSPITAL_COMMUNITY): Payer: Medicare PPO

## 2021-09-29 NOTE — Progress Notes (Signed)
   Follow-Up Visit   Subjective  Beverly [name] is a 76 y.o. female who presents for the following: Follow-up (Right forearm-- ln2 last office visit- didn't go away completely, right shoulder- ? Sk - weird location ).  Spot on right shoulder is changed, crust on arm did not clear with freezing, other crusts Location:  Duration:  Quality:  Associated Signs/Symptoms: Modifying Factors:  Severity:  Timing: Context:   Objective  Well appearing patient in no apparent distress; mood and affect are within normal limits. Right Breast History of growth, change in color.  Clinically more likely irritated keratosis then pigmented Bowen's.  See photograph.       Right Forearm - Anterior Residual pink 6 mm flat scale on treated spot plus many other small actinic keratoses versus CIS    A focused examination was performed including head, neck, arms, upper torso. Relevant physical exam findings are noted in the Assessment and Plan.   Assessment & Plan    Neoplasm of uncertain behavior of skin Right Breast  Skin / nail biopsy Type of biopsy: tangential   Informed consent: discussed and consent obtained   Timeout: patient name, date of birth, surgical site, and procedure verified   Anesthesia: the lesion was anesthetized in a standard fashion   Anesthetic:  1% lidocaine w/ epinephrine 1-100,000 local infiltration Instrument used: flexible razor blade   Hemostasis achieved with: ferric subsulfate   Outcome: patient tolerated procedure well   Post-procedure details: wound care instructions given    Specimen 1 - Surgical pathology Differential Diagnosis: SK, scc vs bcc  Check Margins: No  AK (actinic keratosis) Right Forearm - Anterior  Destruction of lesion - Right Forearm - Anterior Complexity: simple   Destruction method: cryotherapy   Informed consent: discussed and consent obtained   Timeout:  patient name, date of birth, surgical site, and procedure  verified Lesion destroyed using liquid nitrogen: Yes   Cryotherapy cycles:  5 Outcome: patient tolerated procedure well with no complications   Post-procedure details: wound care instructions given    Related Medications Fluorouracil (TOLAK) 4 % CREA Apply to aa total 28 applications & sun protect      I, Lavonna Monarch, MD, have reviewed all documentation for this visit.  The documentation on 09/29/21 for the exam, diagnosis, procedures, and orders are all accurate and complete.

## 2021-10-01 ENCOUNTER — Encounter (HOSPITAL_COMMUNITY): Payer: Medicare PPO

## 2021-10-03 ENCOUNTER — Other Ambulatory Visit (HOSPITAL_COMMUNITY): Payer: Self-pay

## 2021-10-03 ENCOUNTER — Encounter (HOSPITAL_COMMUNITY)
Admission: RE | Admit: 2021-10-03 | Discharge: 2021-10-03 | Disposition: A | Discharge status: Home / Self Care | Payer: Medicare PPO | Source: Ambulatory Visit | Attending: Cardiovascular Disease | Admitting: Cardiovascular Disease

## 2021-10-03 ENCOUNTER — Ambulatory Visit: Payer: Medicare PPO | Admitting: Internal Medicine

## 2021-10-03 VITALS — BP 122/60 | HR 71 | Temp 97.3°F | Resp 12 | Wt 146.8 lb

## 2021-10-03 DIAGNOSIS — K219 Gastro-esophageal reflux disease without esophagitis: Secondary | ICD-10-CM | POA: Diagnosis not present

## 2021-10-03 DIAGNOSIS — D839 Common variable immunodeficiency, unspecified: Secondary | ICD-10-CM

## 2021-10-03 DIAGNOSIS — J3089 Other allergic rhinitis: Secondary | ICD-10-CM | POA: Diagnosis not present

## 2021-10-03 DIAGNOSIS — K52832 Lymphocytic colitis: Secondary | ICD-10-CM

## 2021-10-03 DIAGNOSIS — Z48812 Encounter for surgical aftercare following surgery on the circulatory system: Secondary | ICD-10-CM | POA: Diagnosis not present

## 2021-10-03 DIAGNOSIS — J0100 Acute maxillary sinusitis, unspecified: Secondary | ICD-10-CM | POA: Diagnosis not present

## 2021-10-03 DIAGNOSIS — Z955 Presence of coronary angioplasty implant and graft: Secondary | ICD-10-CM

## 2021-10-03 MED ORDER — DOXYCYCLINE MONOHYDRATE 100 MG PO TABS
100.0000 mg | ORAL_TABLET | Freq: Two times a day (BID) | ORAL | 0 refills | Status: AC
  Filled 2021-10-03: qty 28, 14d supply, fill #0

## 2021-10-03 NOTE — Patient Instructions (Addendum)
  1.  Continue immunoglobulin infusions every week  -For acute sinus infection: take Doxycycline '100mg'$  twice a day   -wear sunscreen while on doxycycline  -If still symptomatic after 2 weeks of antibiotics give [place] a call   2.  Continue to treat reflex / LPR:   A.  Pantoprazole 40 mg in AM  B. Famotidine 40 mg in PM   3.  Treat and prevent inflammation of upper airway:   A.  Montelukast 10 mg -1 tablet 1 time per day  B.  OTC Nasacort -1 spray each nostril 1 time per day  4. If needed:  A. Loratadine 10 mg 1 tablet 1 time per day  B. Ipratropium 0.06% - 2 sprays each nostril every 6 hours (4x/day)  5. Continue routine CVID labs (not needed today)   6.  Continue budesonide therapy for lymphocytic colitis  7.  Keep 6 month follow up with Dr. Neldon Mc   8.  Obtain fall flu vaccine and RSV vaccine

## 2021-10-03 NOTE — Progress Notes (Signed)
Follow Up Note  RE: Beverly [name] MRN: [id number] DOB: 1945-09-30 Date of Office Visit: 10/03/2021  Referring provider: Isaac Bliss, [facility]* Primary care provider: Isaac Bliss, Rayford Halsted, MD  Chief Complaint: Other (Started off 2 weeks ago with laryngitis. Nasal congestion, headaches across forehead, nasal drainage, coughing up mucous plugs(light yellow to dark yellow to green) ) and Cough  History of Present Illness: I had the pleasure of seeing Beverly [name] for a follow up visit at the [facility] on 10/03/2021. She is a 76 y.o. 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 Her previous allergy office visit was on 08/12/2021 with Dr. Neldon Mc. Today is a  acute visit  .  History obtained from patient, chart review.  She recently went on a cruise to [place] and upon return started developing increased sinus pressure, productive cough of yellow sputum, increased nasal congestion and rhinorrhea.  Denies any dyspnea or wheezing.  Reports this feels similar to previous sinus infections.  This is her first sinus infection she has had since starting SCIG.  Prior to C VID diagnosis she would get recurrent sinus infections that require up to 4 weeks of treatment.  Usually responded to doxycycline.  She is continue Nasacort and montelukast but this has not controlled her nasal congestion over the past 2 weeks.  She reports diarrhea has completely resolved and she is continuing to taper budesonide therapy for lymphocytic colitis.  Reflux is well controlled with pantoprazole and famotidine  LABS: 08/12/21: IgG 1,159   Assessment and Plan: Venus is a 76 y.o. female with: CVID (common variable immunodeficiency) ([place])  LPRD (laryngopharyngeal reflux disease)  Lymphocytic colitis  Acute  non-recurrent maxillary sinusitis  Other allergic rhinitis Plan: Patient Instructions   1.  Continue immunoglobulin infusions every week  -For acute sinus infection: take Doxycycline '100mg'$  twice a day   -wear sunscreen while on doxycycline  -If still symptomatic after 2 weeks of antibiotics give [place] a call   2.  Continue to treat reflex / LPR:   A.  Pantoprazole 40 mg in AM  B. Famotidine 40 mg in PM   3.  Treat and prevent inflammation of upper airway:   A.  Montelukast 10 mg -1 tablet 1 time per day  B.  OTC Nasacort -1 spray each nostril 1 time per day  4. If needed:  A. Loratadine 10 mg 1 tablet 1 time per day  B. Ipratropium 0.06% - 2 sprays each nostril every 6 hours (4x/day)  5. Continue routine CVID labs (not needed today)   6.  Continue budesonide therapy for lymphocytic colitis  7.  Keep 6 month follow up with Dr. Neldon Mc   8.  Obtain fall flu vaccine and RSV vaccine No follow-ups on file.  No orders of the defined types were placed in this encounter.   Lab Orders  No laboratory test(s) ordered today   Diagnostics: None done   Medication List:  Current Outpatient Medications  Medication Sig Dispense Refill   acetaminophen (TYLENOL) 500 MG tablet Take 1,000 mg by mouth every 6 (six) hours as needed for moderate pain.      albuterol (VENTOLIN HFA) 108 (90 Base) MCG/ACT inhaler Inhale 2 puffs into the lungs every 6 (six) hours as needed for wheezing or shortness of breath. 18 g 1   aspirin EC  81 MG tablet Take 81 mg by mouth daily.     atorvastatin (LIPITOR) 40 MG tablet Take 1 tablet by mouth daily. 90 tablet 1   budesonide (ENTOCORT EC) 3 MG 24 hr capsule Take 3 capsules (9 mg total) by mouth daily for 28 days, THEN 2 capsules (6 mg total) daily for 56 days, THEN 1 capsule (3 mg total) daily. 252 capsule 0   Buprenorphine HCl (BELBUCA) 750 MCG FILM Place 1 film to inside of mouth every 12 hours 60 each 2   Calcium Carbonate-Vitamin D 600-200 MG-UNIT TABS  Take 1 tablet by mouth 2 (two) times daily.     celecoxib (CELEBREX) 200 MG capsule TAKE 1 CAPSULE BY MOUTH 2 TIMES DAILY 180 capsule 1   colestipol (COLESTID) 1 g tablet Take 1 tablet by mouth daily. 90 tablet 0   diclofenac Sodium (VOLTAREN) 1 % GEL Apply 2 g topically 4 (four) times daily as needed (joint pain).     Docusate Sodium 100 MG capsule Take 100 mg by mouth daily.     EPINEPHrine 0.3 mg/0.3 mL IJ SOAJ injection Inject 0.3 mg into the muscle as needed for anaphylaxis.     famotidine (PEPCID) 40 MG tablet Take 1 tablet by mouth at bedtime. 90 tablet 2   Fluorouracil (TOLAK) 4 % CREA Apply to aa total 28 applications & sun protect 40 g 0   gabapentin (NEURONTIN) 800 MG tablet Take 1 tablet by mouth at bedtime. 90 tablet 2   guaiFENesin (MUCINEX) 600 MG 12 hr tablet Take 600 mg by mouth 2 (two) times daily as needed for cough or to loosen phlegm.     HYDROcodone-acetaminophen (NORCO) 7.5-325 MG tablet Take 1 tablet by mouth four times a day as needed for pain 100 tablet 0   ipratropium (ATROVENT) 0.03 % nasal spray Place 1 spray into both nostrils 3 (three) times daily. 30 mL 5   Lactobacillus-Inulin (CULTURELLE ADULT ULT BALANCE) CAPS Take 1 capsule by mouth daily.     levothyroxine (SYNTHROID) 25 MCG tablet Take 1 tablet (25 mcg total) by mouth daily. 90 tablet 1   loratadine (CLARITIN) 10 MG tablet Take 1 tablet (10 mg total) by mouth daily as needed for allergies (Can take an extra dose during flare ups.). 60 tablet 5   Melatonin-Pyridoxine (MELATIN PO) Take 5 mg by mouth at bedtime.     methocarbamol (ROBAXIN) 500 MG tablet Take 1 tablet by mouth three times a day as needed 270 tablet 2   montelukast (SINGULAIR) 10 MG tablet Take 1 tablet (10 mg total) by mouth at bedtime. 90 tablet 1   Multiple Vitamins-Minerals (MULTIVITAMIN WITH MINERALS) tablet Take 1 tablet by mouth daily.     nitroGLYCERIN (NITROSTAT) 0.4 MG SL tablet Place 1 tablet (0.4 mg total) under the tongue every 5  (five) minutes as needed. 25 tablet 2   omega-3 acid ethyl esters (LOVAZA) 1 g capsule TAKE 2 CAPSULES BY MOUTH 2 TIMES DAILY 360 capsule 0   ondansetron (ZOFRAN-ODT) 4 MG disintegrating tablet Take 1 tablet by mouth every 6 (six) hours as needed for nausea or vomiting. 30 tablet 1   pantoprazole (PROTONIX) 40 MG tablet Take 1 tablet (40 mg total) by mouth daily with breakfast. 90 tablet 3   PARoxetine (PAXIL) 30 MG tablet Take 1 tablet by mouth once a day. 90 tablet 1   ticagrelor (BRILINTA) 90 MG TABS tablet Take 1 tablet by mouth 2 times daily. (stop Plavix (clopidogrel)) 180 tablet 3  Vitamin D, Ergocalciferol, (DRISDOL) 1.25 MG (50000 UNIT) CAPS capsule Take 1 capsule (50,000 Units total) by mouth every 7 (seven) days for 12 doses. 12 capsule 0   XEMBIFY 10 GM/50ML SOLN Inject 10 g into the skin once a week.     No current facility-administered medications for this visit.   Allergies: Allergies  Allergen Reactions   Plavix [Clopidogrel] Rash   Adhesive [Tape] Rash   Codeine Rash   I reviewed her past medical history, social history, family history, and environmental history and no significant changes have been reported from her previous visit.  ROS: All others negative except as noted per HPI.   Objective: BP 122/60   Pulse 71   Temp (!) 97.3 F (36.3 C) (Temporal)   Resp 12   Wt 146 lb 12.8 oz (66.6 kg)   SpO2 97%   BMI 25.20 kg/m  Body mass index is 25.2 kg/m. General Appearance:  Alert, cooperative, no distress, appears stated age  Head:  Normocephalic, without obvious abnormality, atraumatic  Eyes:  Conjunctiva clear, EOM's intact  Nose: Nares normal,  maxillary and frontal sinus tenderness, erythematous nasal mucosa with yellow rhinorrhea, no visible anterior polyps, and septum midline  Throat: Lips, tongue normal; teeth and gums normal, no tonsillar exudate and + cobblestoning  Neck: Supple, symmetrical  Lungs:   clear to auscultation bilaterally, Respirations  unlabored, intermittent dry coughing  Heart:  regular rate and rhythm and no murmur, Appears well perfused  Extremities: No edema  Skin: Skin color, texture, turgor normal, no rashes or lesions on visualized portions of skin   Neurologic: No gross deficits   Previous notes and tests were reviewed. The plan was reviewed with the patient/family, and all questions/concerned were addressed.  It was my pleasure to see Aleyah today and participate in her care. Please feel free to contact me with any questions or concerns.  Sincerely,  Roney Marion, MD  Allergy & Immunology  [facility] Office: [phone]

## 2021-10-05 ENCOUNTER — Encounter: Payer: Self-pay | Admitting: Internal Medicine

## 2021-10-06 ENCOUNTER — Encounter (HOSPITAL_COMMUNITY)
Admission: RE | Admit: 2021-10-06 | Discharge: 2021-10-06 | Disposition: A | Discharge status: Home / Self Care | Payer: Medicare PPO | Source: Ambulatory Visit | Attending: Cardiovascular Disease | Admitting: Cardiovascular Disease

## 2021-10-06 ENCOUNTER — Other Ambulatory Visit (HOSPITAL_COMMUNITY): Payer: Self-pay

## 2021-10-06 DIAGNOSIS — Z955 Presence of coronary angioplasty implant and graft: Secondary | ICD-10-CM | POA: Diagnosis not present

## 2021-10-06 DIAGNOSIS — Z48812 Encounter for surgical aftercare following surgery on the circulatory system: Secondary | ICD-10-CM | POA: Diagnosis not present

## 2021-10-06 MED ORDER — AMOXICILLIN-POT CLAVULANATE 875-125 MG PO TABS
1.0000 | ORAL_TABLET | Freq: Two times a day (BID) | ORAL | 0 refills | Status: AC
  Filled 2021-10-06: qty 24, 12d supply, fill #0

## 2021-10-06 NOTE — Telephone Encounter (Signed)
How much Augmentin?

## 2021-10-06 NOTE — Telephone Encounter (Signed)
We will be giving the flu vaccine this fall and hopefully the new RSV vaccine once it is available.  In regards to the doxycycline it can cause some irritation to the esophagus.  We can switch to augmentin 875/'125mg'$  twice a day

## 2021-10-07 ENCOUNTER — Other Ambulatory Visit (HOSPITAL_COMMUNITY): Payer: Self-pay

## 2021-10-07 NOTE — Progress Notes (Signed)
Cardiac Individual Treatment Plan  Patient Details  Name: Beverly [name] MRN: [id number] Date of Birth: 1945-06-13 Referring Provider:   Flowsheet Row INTENSIVE CARDIAC REHAB ORIENT from 08/05/2021 in [place]  Referring Provider [name], MD       Initial Encounter Date:  [facility] from 08/05/2021 in [place]  Date 08/05/21       Visit Diagnosis: 05/19/21 S/P DES x 2 LAD, Ramus  Patient's Home Medications on Admission:  Current Outpatient Medications:    acetaminophen (TYLENOL) 500 MG tablet, Take 1,000 mg by mouth every 6 (six) hours as needed for moderate pain. , Disp: , Rfl:    albuterol (VENTOLIN HFA) 108 (90 Base) MCG/ACT inhaler, Inhale 2 puffs into the lungs every 6 (six) hours as needed for wheezing or shortness of breath., Disp: 18 g, Rfl: 1   amoxicillin-clavulanate (AUGMENTIN) 875-125 MG tablet, Take 1 tablet by mouth 2 times daily for 12 days., Disp: 24 tablet, Rfl: 0   aspirin EC 81 MG tablet, Take 81 mg by mouth daily., Disp: , Rfl:    atorvastatin (LIPITOR) 40 MG tablet, Take 1 tablet by mouth daily., Disp: 90 tablet, Rfl: 1   budesonide (ENTOCORT EC) 3 MG 24 hr capsule, Take 3 capsules (9 mg total) by mouth daily for 28 days, THEN 2 capsules (6 mg total) daily for 56 days, THEN 1 capsule (3 mg total) daily., Disp: 252 capsule, Rfl: 0   Buprenorphine HCl (BELBUCA) 750 MCG FILM, Place 1 film to inside of mouth every 12 hours, Disp: 60 each, Rfl: 2   Calcium Carbonate-Vitamin D 600-200 MG-UNIT TABS, Take 1 tablet by mouth 2 (two) times daily., Disp: , Rfl:    celecoxib (CELEBREX) 200 MG capsule, TAKE 1 CAPSULE BY MOUTH 2 TIMES DAILY, Disp: 180 capsule, Rfl: 1   colestipol (COLESTID) 1 g tablet, Take 1 tablet by mouth daily., Disp: 90 tablet, Rfl: 0   diclofenac Sodium (VOLTAREN) 1 % GEL, Apply 2 g topically 4 (four) times daily as needed (joint pain)., Disp: ,  Rfl:    Docusate Sodium 100 MG capsule, Take 100 mg by mouth daily., Disp: , Rfl:    doxycycline (ADOXA) 100 MG tablet, Take 1 tablet (100 mg total) by mouth 2 (two) times daily for 14 days., Disp: 28 tablet, Rfl: 0   EPINEPHrine 0.3 mg/0.3 mL IJ SOAJ injection, Inject 0.3 mg into the muscle as needed for anaphylaxis., Disp: , Rfl:    famotidine (PEPCID) 40 MG tablet, Take 1 tablet by mouth at bedtime., Disp: 90 tablet, Rfl: 2   Fluorouracil (TOLAK) 4 % CREA, Apply to aa total 28 applications & sun protect, Disp: 40 g, Rfl: 0   gabapentin (NEURONTIN) 800 MG tablet, Take 1 tablet by mouth at bedtime., Disp: 90 tablet, Rfl: 2   guaiFENesin (MUCINEX) 600 MG 12 hr tablet, Take 600 mg by mouth 2 (two) times daily as needed for cough or to loosen phlegm., Disp: , Rfl:    HYDROcodone-acetaminophen (NORCO) 7.5-325 MG tablet, Take 1 tablet by mouth four times a day as needed for pain, Disp: 100 tablet, Rfl: 0   ipratropium (ATROVENT) 0.03 % nasal spray, Place 1 spray into both nostrils 3 (three) times daily., Disp: 30 mL, Rfl: 5   Lactobacillus-Inulin (CULTURELLE ADULT ULT BALANCE) CAPS, Take 1 capsule by mouth daily., Disp: , Rfl:    levothyroxine (SYNTHROID) 25 MCG tablet, Take 1 tablet (25 mcg total)  by mouth daily., Disp: 90 tablet, Rfl: 1   loratadine (CLARITIN) 10 MG tablet, Take 1 tablet (10 mg total) by mouth daily as needed for allergies (Can take an extra dose during flare ups.)., Disp: 60 tablet, Rfl: 5   Melatonin-Pyridoxine (MELATIN PO), Take 5 mg by mouth at bedtime., Disp: , Rfl:    methocarbamol (ROBAXIN) 500 MG tablet, Take 1 tablet by mouth three times a day as needed, Disp: 270 tablet, Rfl: 2   montelukast (SINGULAIR) 10 MG tablet, Take 1 tablet (10 mg total) by mouth at bedtime., Disp: 90 tablet, Rfl: 1   Multiple Vitamins-Minerals (MULTIVITAMIN WITH MINERALS) tablet, Take 1 tablet by mouth daily., Disp: , Rfl:    nitroGLYCERIN (NITROSTAT) 0.4 MG SL tablet, Place 1 tablet (0.4 mg total)  under the tongue every 5 (five) minutes as needed., Disp: 25 tablet, Rfl: 2   omega-3 acid ethyl esters (LOVAZA) 1 g capsule, TAKE 2 CAPSULES BY MOUTH 2 TIMES DAILY, Disp: 360 capsule, Rfl: 0   ondansetron (ZOFRAN-ODT) 4 MG disintegrating tablet, Take 1 tablet by mouth every 6 (six) hours as needed for nausea or vomiting., Disp: 30 tablet, Rfl: 1   pantoprazole (PROTONIX) 40 MG tablet, Take 1 tablet (40 mg total) by mouth daily with breakfast., Disp: 90 tablet, Rfl: 3   PARoxetine (PAXIL) 30 MG tablet, Take 1 tablet by mouth once a day., Disp: 90 tablet, Rfl: 1   ticagrelor (BRILINTA) 90 MG TABS tablet, Take 1 tablet by mouth 2 times daily. 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   right sholder   BCC (basal cell carcinoma of skin) 05/09/2014   mid lower back   BCC (basal cell carcinoma of skin) 05/03/2017   right low back   BCC (basal cell carcinoma of skin) 05/03/2017   left upper back   BCC (basal cell carcinoma of skin) 07/05/2018   left mid back   BCC (basal cell carcinoma of skin) 05/20/1992   upper back   BCC (basal cell carcinoma of skin) 07/29/1993   left sholder medial   BCC (basal cell carcinoma of skin) 07/29/1993   left sholder lateral   BCC (basal cell carcinoma of skin) 07/29/1993   right thigh   BCC (basal cell carcinoma of skin) 07/29/1993   right sholder   BCC (basal cell carcinoma of  skin) 12/22/1994   right mid forearm   BCC (basal cell carcinoma of skin) 12/22/1994   right upper forearm   BCC (basal cell carcinoma of skin) 12/22/1994   lower right upper arm   BCC (basal cell carcinoma of skin) 12/22/1994   right upper arm sholder   BCC (basal cell carcinoma of skin) 08/11/1995   left leg below knee   BCC (basal cell carcinoma of skin) 04/11/2002   mid back   BCC (  basal cell carcinoma of skin) 12/10/2002   right center upper back   BCC (basal cell carcinoma of skin) 05/26/2005   right post sholder   BCC (basal cell carcinoma) 05/09/2014   left inner shin   BCC (basal cell carcinoma) 06/12/2014   left forearm   Bowen's disease 10/07/1994   right post knee, right inner forearm/wrist   Bowen's disease 08/11/1995   left sholder   Bronchiectasis ([place])    Cataract    left   Chronic pain    Common variable immunodeficiency [facility] [place])    sees dr Gayleen Orem 02-11-2021 epic   Coronary artery disease    Depression    hx of   Diverticulosis of colon 1998   mild   DJD (degenerative joint disease)    Duodenal ulcer    h/o yrs ago   GERD (gastroesophageal reflux disease)    History of SCC (squamous cell carcinoma) of skin    Dr. Denna Haggard   History of sinus bradycardia    HLD (hyperlipidemia)    hypertriglyceridemia   Hypertensive retinopathy of both eyes 11/18/2017   Hypothyroidism    IBS (irritable bowel syndrome)    Internal hemorrhoids 1998   Lymphocytic colitis    Mitral regurgitation    mild   Nocardiosis    relased by infection disease dec 2022   Osteoarthritis    feet,shoulder,neck,back,hips and hands.   Pneumonia 2015   PONV (postoperative nausea and vomiting)    Rotator cuff tear, right 02/2019   infraspinatus and supraspinatus, and dislocation of long head of bicep tendon (Dr. Alvan Dame)   SCC (squamous cell carcinoma) 11/26/2014   left hand, right hand, right deltoid mnay areas   SCC (squamous cell carcinoma) 05/03/2017   left cheek   Sleep apnea     uses a mouth guard nightly   Squamous cell carcinoma in situ (SCCIS) 07/05/2018   left hand   Tracheobronchomalacia    Vitamin D deficiency    mild    Tobacco Use: Social History   Tobacco Use  Smoking Status Never  Smokeless Tobacco Never    Labs: Review Flowsheet  More data exists      Latest Ref Rng & Units 06/10/2017 07/12/2018 01/11/2019 06/22/2019 11/20/2020  Labs for ITP Cardiac and Pulmonary Rehab  Cholestrol 0 - 200 mg/dL 146  145  162  161  137   LDL (calc) 0 - 99 mg/dL 72  61  85  73  70   HDL-C >39.00 mg/dL 47  36  47  49  40.90   Trlycerides 0.0 - 149.0 mg/dL 135  238  173  197  129.0     Capillary Blood Glucose: No results found for: "GLUCAP"   Exercise Target Goals: Exercise Program Goal: Individual exercise prescription set using results from initial 6 min walk test and THRR while considering  patient's activity barriers and safety.   Exercise Prescription Goal: Initial exercise prescription builds to 30-45 minutes a day of aerobic activity, 2-3 days per week.  Home exercise guidelines will be given to patient during program as part of exercise prescription that the participant will acknowledge.  Activity Barriers & Risk Stratification:  Activity Barriers & Cardiac Risk Stratification - 08/05/21 1413       Activity Barriers & Cardiac Risk Stratification   Activity Barriers Back Problems;Arthritis;Joint Problems;Neck/Spine Problems;Muscular Weakness;Balance Concerns    Cardiac Risk Stratification High             6 Minute Walk:  6 Minute  Walk     Row Name 08/05/21 1412         6 Minute Walk   Phase Initial     Distance 1211 feet     Walk Time 6 minutes     # of Rest Breaks 0     MPH 2.3     METS 2.53     RPE 11     Perceived Dyspnea  0     VO2 Peak 8.85     Symptoms No     Resting HR 70 bpm     Resting BP 114/60     Resting Oxygen Saturation  98 %     Exercise Oxygen Saturation  during 6 min walk 98 %     Max Ex. HR 75 bpm      Max Ex. BP 142/64     2 Minute Post BP 130/64              Oxygen Initial Assessment:   Oxygen Re-Evaluation:   Oxygen Discharge (Final Oxygen Re-Evaluation):   Initial Exercise Prescription:  Initial Exercise Prescription - 08/05/21 1400       Date of Initial Exercise RX and Referring Provider   Date 08/05/21    Referring Provider [name], MD    Expected Discharge Date 10/03/21      NuStep   Level 2    SPM 75    Minutes 30    METs 2.5      Prescription Details   Frequency (times per week) 3    Duration Progress to 30 minutes of continuous aerobic without signs/symptoms of physical distress      Intensity   THRR 40-80% of Max Heartrate 59-118    Ratings of Perceived Exertion 11-13    Perceived Dyspnea 0-4      Progression   Progression Continue progressive overload as per policy without signs/symptoms or physical distress.      Resistance Training   Training Prescription Yes    Weight 2 lbs    Reps 10-15             Perform Capillary Blood Glucose checks as needed.  Exercise Prescription Changes:   Exercise Prescription Changes     Row Name 08/11/21 1300 09/03/21 1400 10/06/21 1015         Response to Exercise   Blood Pressure (Admit) 124/60 114/60 138/70     Blood Pressure (Exercise) 128/66 160/80 158/70     Blood Pressure (Exit) 102/60 100/52 104/64     Heart Rate (Admit) 67 bpm 84 bpm 57 bpm     Heart Rate (Exercise) 72 bpm 100 bpm 90 bpm     Heart Rate (Exit) 57 bpm 58 bpm 61 bpm     Rating of Perceived Exertion (Exercise) _0 Perceived Dyspnea (Exercise) -- 3 --     Symptoms None None None     Comments Pt's first day in the CRP2 program Reviewed METs, goals and Home exercise Rx Reviewed METs, goals     Duration Continue with 30 min of aerobic exercise without signs/symptoms of physical distress. Continue with 30 min of aerobic exercise without signs/symptoms of physical distress. Continue with 30 min of aerobic exercise  without signs/symptoms of physical distress.     Intensity THRR unchanged THRR unchanged THRR unchanged       Progression   Progression Continue to progress workloads to maintain intensity without signs/symptoms of physical distress. Continue to progress workloads  to maintain intensity without signs/symptoms of physical distress. Continue to progress workloads to maintain intensity without signs/symptoms of physical distress.     Average METs 2.3 3.7 4.9       Resistance Training   Training Prescription Yes No Yes     Weight 2 lbs No weights on Wednesdays 2 lbs     Reps 10-15 -- 10-15     Time 10 Minutes -- 10 Minutes       Interval Training   Interval Training No No No       NuStep   Level _0 SPM 79 -- --     Minutes _1 METs 2.3 3.7 4.9       Home Exercise Plan   Plans to continue exercise at -- Home (comment) Home (comment)     Frequency -- Add 2 additional days to program exercise sessions. Add 2 additional days to program exercise sessions.     Initial Home Exercises Provided -- 09/03/21 09/03/21              Exercise Comments:   Exercise Comments     Row Name 08/11/21 1403 09/03/21 1458 10/07/21 0830       Exercise Comments Pt's first day in the CRP2 program. Pt had no complaints with todays session and is off to a good start. Reviewed METs, goals and home exercise Rx. Pt verbalized understanding of the home exercise Rx and was provided a copy. Reviewed METs and goals. Met level has increased form 3.7  to 4.9 since last review.              Exercise Goals and Review:   Exercise Goals     Row Name 08/05/21 1417             Exercise Goals   Increase Physical Activity Yes       Intervention Provide advice, education, support and counseling about physical activity/exercise needs.;Develop an individualized exercise prescription for aerobic and resistive training based on initial evaluation findings, risk stratification, comorbidities and  participant's personal goals.       Expected Outcomes Short Term: Attend rehab on a regular basis to increase amount of physical activity.;Long Term: Add in home exercise to make exercise part of routine and to increase amount of physical activity.;Long Term: Exercising regularly at least 3-5 days a week.       Increase Strength and Stamina Yes       Intervention Provide advice, education, support and counseling about physical activity/exercise needs.;Develop an individualized exercise prescription for aerobic and resistive training based on initial evaluation findings, risk stratification, comorbidities and participant's personal goals.       Expected Outcomes Short Term: Increase workloads from initial exercise prescription for resistance, speed, and METs.;Short Term: Perform resistance training exercises routinely during rehab and add in resistance training at home;Long Term: Improve cardiorespiratory fitness, muscular endurance and strength as measured by increased METs and functional capacity (6MWT)       Able to understand and use rate of perceived exertion (RPE) scale Yes       Intervention Provide education and explanation on how to use RPE scale       Expected Outcomes Short Term: Able to use RPE daily in rehab to express subjective intensity level;Long Term:  Able to use RPE to guide intensity level when exercising independently       Knowledge and understanding of Target Heart Rate Range (  THRR) Yes       Intervention Provide education and explanation of THRR including how the numbers were predicted and where they are located for reference       Expected Outcomes Short Term: Able to state/look up THRR;Short Term: Able to use daily as guideline for intensity in rehab;Long Term: Able to use THRR to govern intensity when exercising independently       Understanding of Exercise Prescription Yes       Intervention Provide education, explanation, and written materials on patient's individual exercise  prescription       Expected Outcomes Short Term: Able to explain program exercise prescription;Long Term: Able to explain home exercise prescription to exercise independently                Exercise Goals Re-Evaluation :  Exercise Goals Re-Evaluation     Row Name 08/11/21 1402 09/03/21 1452 10/07/21 0826         Exercise Goal Re-Evaluation   Exercise Goals Review Increase Physical Activity;Increase Strength and Stamina;Able to understand and use rate of perceived exertion (RPE) scale;Knowledge and understanding of Target Heart Rate Range (THRR);Understanding of Exercise Prescription Increase Physical Activity;Increase Strength and Stamina;Able to understand and use rate of perceived exertion (RPE) scale;Knowledge and understanding of Target Heart Rate Range (THRR);Understanding of Exercise Prescription Increase Physical Activity;Increase Strength and Stamina;Able to understand and use rate of perceived exertion (RPE) scale;Knowledge and understanding of Target Heart Rate Range (THRR);Understanding of Exercise Prescription     Comments Pt's first day in the CRP2 program. Pt understands the exercise RX, RPE scale and THRR. Reviewed METs, goals and home exercise Rx. Pt is not doing any regualr exercise, but works in the yard daily and enjoys dancing. The patient will begin to supplement her exercise here in the cRP2 program by dancing at home 30 miunutes / day x2 /week. Pt can break up the dancing into 10 or 15 minute time frames. Pt had a goal of increased strength and stamina, and she does feel stonger and voices her balance has improved. Pt still feels tired and takes naps. Even though the fatigue is not gone, it has improved as well. Reviewed METs and goals. Pt continues to improve on her exercise capacity with average METs today of 4.9. Pt continues to fell stonger.     Expected Outcomes Will continue to monitor patient and progress exercise workloads as tolerated. Pt will begin her home  exercise by dancing. Will continue to monitor exercise workloads and progress as tolerated.              Discharge Exercise Prescription (Final Exercise Prescription Changes):  Exercise Prescription Changes - 10/06/21 1015       Response to Exercise   Blood Pressure (Admit) 138/70    Blood Pressure (Exercise) 158/70    Blood Pressure (Exit) 104/64    Heart Rate (Admit) 57 bpm    Heart Rate (Exercise) 90 bpm    Heart Rate (Exit) 61 bpm    Rating of Perceived Exertion (Exercise) 12    Symptoms None    Comments Reviewed METs, goals    Duration Continue with 30 min of aerobic exercise without signs/symptoms of physical distress.    Intensity THRR unchanged      Progression   Progression Continue to progress workloads to maintain intensity without signs/symptoms of physical distress.    Average METs 4.9      Resistance Training   Training Prescription Yes    Weight 2 lbs  Reps 10-15    Time 10 Minutes      Interval Training   Interval Training No      NuStep   Level 4    Minutes 30    METs 4.9      Home Exercise Plan   Plans to continue exercise at Home (comment)    Frequency Add 2 additional days to program exercise sessions.    Initial Home Exercises Provided 09/03/21             Nutrition:  Target Goals: Understanding of nutrition guidelines, daily intake of sodium <1544m, cholesterol <2070m calories 30% from fat and 7% or less from saturated fats, daily to have 5 or more servings of fruits and vegetables.  Biometrics:  Pre Biometrics - 08/05/21 1135       Pre Biometrics   Waist Circumference 36.25 inches    Hip Circumference 39.25 inches    Waist to Hip Ratio 0.92 %    Triceps Skinfold 20 mm    % Body Fat 36.3 %    Grip Strength 25 kg    Flexibility --   Not done   Single Leg Stand 10.56 seconds              Nutrition Therapy Plan and Nutrition Goals:  Nutrition Therapy & Goals - 08/25/21 1024       Nutrition Therapy   Diet Heart  Healthy Diet    Drug/Food Interactions Statins/Certain Fruits      Personal Nutrition Goals   Nutrition Goal Patient to choose a variety of fruits, vegetables, lean protein/plant protein, whole grains, and nonfat dairy daily as part of heart healthy diet.    Personal Goal #2 Patient to limit to 150044mf sodium daily    Personal Goal #3 Patient to identify and limit daily intake of saturated fat, trans fat, sodium, and refined carbohydrates.    Comments Goals in progress. Patient reports committment to heart healthy diet; she recently purchased an airfyer. She reports improved acid reflux since stopped drinking with a straw; hx of lymphocytic colitis, GERD, ulcer.      Intervention Plan   Intervention Prescribe, educate and counsel regarding individualized specific dietary modifications aiming towards targeted core components such as weight, hypertension, lipid management, diabetes, heart failure and other comorbidities.    Expected Outcomes Short Term Goal: Understand basic principles of dietary content, such as calories, fat, sodium, cholesterol and nutrients.;Long Term Goal: Adherence to prescribed nutrition plan.             Nutrition Assessments:  MEDIFICTS Score Key: ?70 Need to make dietary changes  40-70 Heart Healthy Diet ? 40 Therapeutic Level Cholesterol Diet    Picture Your Plate Scores: <40<82healthy dietary pattern with much room for improvement. 41-50 Dietary pattern unlikely to meet recommendations for good health and room for improvement. 51-60 More healthful dietary pattern, with some room for improvement.  >60 Healthy dietary pattern, although there may be some specific behaviors that could be improved.    Nutrition Goals Re-Evaluation:  Nutrition Goals Re-Evaluation     RowBerkeleyme 08/11/21 0945 08/25/21 1024 09/26/21 1424         Goals   Current Weight 147 lb 7.8 oz (66.9 kg) 145 lb 8.1 oz (66 kg) 145 lb 8.1 oz (66 kg)  last weight from 09/19/2021      Nutrition Goal -- -- Patient to choose a variety of fruits, vegetables, lean protein/plant protein, whole grains, and nonfat dairy daily as part  of heart healthy diet.     Comment -- Lipid panel WNL, BMI normal. Patient is out of town for two weeks during this time. No new labs, most recent labs show Lipid panel WNL. 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 Labs WNL and normal weight BMI. Goals in progress. Patient reports committment to heart healthy diet; she recently purchased an airfyer. She reports improved acid reflux since stopped drinking with a straw; hx of lymphocytic colitis, GERD, ulcer. Goals in progress. Patient reports committment to heart healthy diet. She continues to attend the the [employer] education classes.       Personal Goal #2 Re-Evaluation   Personal Goal #2 -- -- Patient to limit to 153m of sodium daily       Personal Goal #3 Re-Evaluation   Personal Goal #3 -- -- Patient to identify and limit daily intake of saturated fat, trans fat, sodium, and refined carbohydrates.              Nutrition Goals Re-Evaluation:  Nutrition Goals Re-Evaluation     RHixtonName 08/11/21 0945 08/25/21 1024 09/26/21 1424         Goals   Current Weight 147 lb 7.8 oz (66.9 kg) 145 lb 8.1 oz (66 kg) 145 lb 8.1 oz (66 kg)  last weight from 09/19/2021     Nutrition Goal -- -- Patient to choose a variety of fruits, vegetables, lean protein/plant protein, whole grains, and nonfat dairy daily as part of heart healthy diet.     Comment -- Lipid panel WNL, BMI normal. Patient is out of town for two weeks during this time. No new labs, most recent labs show Lipid panel WNL. 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 Goals in progress. Patient reports committment to heart healthy diet; she recently purchased an airfyer. She  reports improved acid reflux since stopped drinking with a straw; hx of lymphocytic colitis, GERD, ulcer. Goals in progress. Patient reports committment to heart healthy diet. She continues to attend the the [employer] education classes.       Personal Goal #2 Re-Evaluation   Personal Goal #2 -- -- Patient to limit to 15036mof sodium daily       Personal Goal #3 Re-Evaluation   Personal Goal #3 -- -- Patient to identify and limit daily intake of saturated fat, trans fat, sodium, and refined carbohydrates.              Nutrition Goals Discharge (Final Nutrition Goals Re-Evaluation):  Nutrition Goals Re-Evaluation - 09/26/21 1424       Goals   Current Weight 145 lb 8.1 oz (66 kg)   last weight from 09/19/2021   Nutrition Goal Patient to choose a variety of fruits, vegetables, lean protein/plant protein, whole grains, and nonfat dairy daily as part of heart healthy diet.    Comment Patient is out of town for two weeks during this time. No new labs, most recent labs show Lipid panel WNL. Weight stable    Expected Outcome Goals in progress. Patient reports committment to heart healthy diet. She continues to attend the the [employer] education classes.      Personal Goal #2 Re-Evaluation   Personal Goal #2 Patient to limit to [id number] sodium daily      Personal Goal #3 Re-Evaluation  Personal Goal #3 Patient to identify and limit daily intake of saturated fat, trans fat, sodium, and refined carbohydrates.             Psychosocial: Target Goals: Acknowledge presence or absence of significant depression and/or stress, maximize coping skills, provide positive support system. Participant is able to verbalize types and ability to use techniques and skills needed for reducing stress and depression.  Initial Review & Psychosocial Screening:  Initial Psych Review & Screening - 08/05/21 1525       Initial Review   Current issues with History of Depression;Current Stress Concerns    Source  of Stress Concerns Chronic Illness    Comments Kenetra needs back surgery which will be deferred due to her recent cardiac stenting.      Family Dynamics   Good Support System? Yes   Shemekia lives alone. Zayana has friends in the area and two children one who lives in [place] the other lives in [place] has a history of depression. Zema's depression is currently controlled on an antidepressant.      Barriers   Psychosocial barriers to participate in program The patient should benefit from training in stress management and relaxation.      Screening Interventions   Interventions Encouraged to exercise             Quality of Life Scores:  Quality of Life - 08/05/21 1406       Quality of Life   Select Quality of Life      Quality of Life Scores   Health/Function Pre 26.46 %    Socioeconomic Pre 30 %    Psych/Spiritual Pre 29.64 %    Family Pre 30 %    GLOBAL Pre 28.32 %            Scores of 19 and below usually indicate a poorer quality of life in these areas.  A difference of  2-3 points is a clinically meaningful difference.  A difference of 2-3 points in the total score of the Quality of Life Index has been associated with significant improvement in overall quality of life, self-image, physical symptoms, and general health in studies assessing change in quality of life.  PHQ-9: Review Flowsheet  More data exists      08/12/2021 08/05/2021 12/10/2020 11/20/2020 08/06/2020  Depression screen PHQ 2/9  Decreased Interest 0 0 0 0 0  Down, Depressed, Hopeless 0 0 0 0 0  PHQ - 2 Score 0 0 0 0 0  Altered sleeping 0 - - 0 -  Tired, decreased energy 0 - - 0 -  Change in appetite 0 - - 0 -  Feeling bad or failure about yourself  0 - - 0 -  Trouble concentrating 0 - - 0 -  Moving slowly or fidgety/restless 0 - - 0 -  Suicidal thoughts 0 - - 0 -  PHQ-9 Score 0 - - 0 -  Difficult doing work/chores Not difficult at all - - - -   Interpretation of  Total Score  Total Score Depression Severity:  1-4 = Minimal depression, 5-9 = Mild depression, 10-14 = Moderate depression, 15-19 = Moderately severe depression, 20-27 = Severe depression   Psychosocial Evaluation and Intervention:   Psychosocial Re-Evaluation:  Psychosocial Re-Evaluation     Row Name 08/12/21 0858 09/09/21 1736 10/07/21 1738         Psychosocial Re-Evaluation   Current issues with History of Depression;Current Stress Concerns History of Depression;Current  Stress Concerns History of Depression;Current Stress Concerns     Comments Janelli did not voice any increased concerns or stressors on her first day of exercise Edison has not voiced any increased concerns or stressors during intensive cardiac rehab. Hayle has not voiced any increased concerns or stressors during intensive cardiac rehab. Kaleesi will complete ICR on 10/17/21     Expected Outcomes Rileigh will have decreased stress and controlled depression upon completion of phase 2 cardiac rehab Anelle will have decreased stress and controlled depression upon completion of phase 2 cardiac rehab Aurielle will have decreased stress and controlled depression upon completion of phase 2 cardiac rehab     Interventions Stress management education;Relaxation education;Encouraged to attend Cardiac Rehabilitation for the exercise Stress management education;Relaxation education;Encouraged to attend Cardiac Rehabilitation for the exercise Stress management education;Relaxation education;Encouraged to attend Cardiac Rehabilitation for the exercise     Continue Psychosocial Services  No Follow up required No Follow up required No Follow up required       Initial Review   Source of Stress Concerns Chronic Illness Chronic Illness Chronic Illness     Comments Will continue to montior and offer support as needed Will continue to montior and offer support as needed Will continue to montior and offer support as needed               Psychosocial Discharge (Final Psychosocial Re-Evaluation):  Psychosocial Re-Evaluation - 10/07/21 1738       Psychosocial Re-Evaluation   Current issues with History of Depression;Current Stress Concerns    Comments Stpehanie has not voiced any increased concerns or stressors during intensive cardiac rehab. Kaitlynne will complete ICR on 10/17/21    Expected Outcomes Christena will have decreased stress and controlled depression upon completion of phase 2 cardiac rehab    Interventions Stress management education;Relaxation education;Encouraged to attend Cardiac Rehabilitation for the exercise    Continue Psychosocial Services  No Follow up required      Initial Review   Source of Stress Concerns Chronic Illness    Comments Will continue to montior and offer support as needed             Vocational Rehabilitation: Provide vocational rehab assistance to qualifying candidates.   Vocational Rehab Evaluation & Intervention:  Vocational Rehab - 08/05/21 1531       Initial Vocational Rehab Evaluation & Intervention   Assessment shows need for Vocational Rehabilitation No   Cherlyn is retired and does not need vocational rehab at this time            Education: Education Goals: Education classes will be provided on a weekly basis, covering required topics. Participant will state understanding/return demonstration of topics presented.    Education     Row Name 08/11/21 0900     Education   Cardiac Education Topics Pritikin   Select Workshops     Workshops   Educator Exercise Physiologist   Select Exercise   Exercise Workshop Hotel manager and Fall Prevention   Instruction Review Code 1- Verbalizes Understanding   Class Start Time 0807   Class Stop Time 0847   Class Time Calculation (min) 40 min    [place] Name 08/15/21 0900     Education   Cardiac Education Topics Pritikin   Lexicographer Psychosocial    Psychosocial Healthy Minds, Bodies, Hearts   Instruction Review Code 1- Verbalizes Understanding   Class Start Time [phone]  Class Stop Time 0850   Class Time Calculation (min) 45 min    [place] Name 08/20/21 0900     Education   Cardiac Education Topics [employer]   Educator Dietitian   Weekly Topic Tasty Appetizers and Snacks   Instruction Review Code 1- Verbalizes Understanding   Class Start Time 0813   Class Stop Time 0845   Class Time Calculation (min) 32 min    [place] Name 08/22/21 1000     Education   Cardiac Education Topics Pritikin   Select Core Videos     Core Videos   Select Nutrition   Nutrition Other  Reading Food Labels   Instruction Review Code 1- Verbalizes Understanding   Class Start Time [phone]   Class Stop Time 0852   Class Time Calculation (min) 41 min    [place] Name 08/25/21 0900     Education   Cardiac Education Topics Pritikin   Select Workshops     Workshops   Educator Exercise Physiologist   Select Psychosocial   Psychosocial Workshop Managing Moods and Relationships   Instruction Review Code 1- Verbalizes Understanding   Class Start Time [phone]   Class Stop Time 0855   Class Time Calculation (min) 43 min    [place] Name 08/27/21 1000     Education   Cardiac Education Topics [employer]   Educator Dietitian   Weekly Topic Delicious Desserts   Instruction Review Code 1- Verbalizes Understanding   Class Start Time 0815   Class Stop Time 0900   Class Time Calculation (min) 45 min    [place] Name 08/29/21 1000     Education   Cardiac Education Topics Pritikin   Select Core Videos     Core Videos   Educator Dietitian   Select Nutrition   Nutrition Calorie Density   Instruction Review Code 1- Verbalizes Understanding   Class Start Time 0815   Class Stop Time 0901   Class Time Calculation (min) 46 min    [place] Name 09/01/21 0900     Education   Cardiac Education  Topics Pritikin   Select Workshops     Workshops   Educator Exercise Physiologist   Select Exercise   Exercise Workshop Exercise Basics: Building Your Action Plan   Instruction Review Code 1- Verbalizes Understanding   Class Start Time 0815   Class Stop Time 1443   Class Time Calculation (min) 40 min    [place] Name 09/03/21 0900     Education   Cardiac Education Topics [employer]   Educator Dietitian   Weekly Topic Powerhouse Plant-Based Proteins   Instruction Review Code 1- Verbalizes Understanding   Class Start Time 0815   Class Stop Time 1540   Class Time Calculation (min) 42 min    [place] Name 09/05/21 0900     Education   Cardiac Education Topics Pritikin   Academic librarian Exercise Education   Exercise Education Move It!   Instruction Review Code 1- Verbalizes Understanding   Class Start Time 0815   Class Stop Time 0867   Class Time Calculation (min) 40 min    [place] Name 09/10/21 0900     Education   Cardiac Education Topics [place]  Cooking Conservator, museum/gallery   Instruction Review Code 1- Verbalizes Understanding   Class Start Time 0810   Class Stop Time 0855   Class Time Calculation (min) 45 min    [place] Name 09/12/21 0900     Education   Cardiac Education Topics Pritikin   Select Core Videos     Core Videos   Educator Nurse   Select General Education   General Education Hypertension and Heart Disease   Instruction Review Code 1- Verbalizes Understanding   Class Start Time [phone]   Class Stop Time 0900   Class Time Calculation (min) 46 min    [place] Name 09/15/21 0900     Education   Cardiac Education Topics Pritikin   Select Workshops     Core Videos   Educator --   Select --   Psychosocial --   Instruction Review Code --   Class Start Time --   Class Stop  Time --   Class Time Calculation (min) --     Workshops   Dentist Psychosocial   Psychosocial Workshop New Thoughts, New Behaviors   Instruction Review Code 1- Verbalizes Understanding   Class Start Time [phone]   Class Stop Time 0848   Class Time Calculation (min) 36 min    [place] Name 09/17/21 1100     Education   Cardiac Education Topics [place]   Educator Dietitian   Weekly Topic One-Pot Wonders   Instruction Review Code 1- Verbalizes Understanding   Class Start Time 0815   Class Stop Time 0900   Class Time Calculation (min) 45 min    [place] Name 09/19/21 1000     Education   Cardiac Education Topics Pritikin   Select Core Videos     Core Videos   Educator Dietitian   Select Nutrition   Nutrition Dining Out - Part 1   Instruction Review Code 1- Verbalizes Understanding   Class Start Time [phone]   Class Stop Time 0857   Class Time Calculation (min) 43 min    [place] Name 10/03/21 0900     Education   Cardiac Education Topics Pritikin   Select Core Videos     Core Videos   Educator Nurse   Select General Education   General Education Heart Disease Risk Reduction   Instruction Review Code 1- Verbalizes Understanding   Class Start Time 0815   Class Stop Time 2376   Class Time Calculation (min) 40 min    [place] Name 10/06/21 0900     Education   Cardiac Education Topics Pritikin   Select Workshops     Workshops   Educator Exercise Physiologist   Select Psychosocial   Psychosocial Workshop Recognizing and Reducing Stress   Instruction Review Code 1- Verbalizes Understanding   Class Start Time 0810   Class Stop Time 0904   Class Time Calculation (min) 54 min            Core Videos: Exercise    Move It!  Clinical staff conducted group or individual video education with verbal and written material and guidebook.  Patient learns the recommended Pritikin exercise program. Exercise with  the goal of living a long, healthy life. Some of the health benefits of exercise include controlled diabetes, healthier blood pressure levels, improved cholesterol levels, improved heart and lung capacity, improved sleep, and better body  composition. Everyone should speak with their doctor before starting or changing an exercise routine.  Biomechanical Limitations Clinical staff conducted group or individual video education with verbal and written material and guidebook.  Patient learns how biomechanical limitations can impact exercise and how we can mitigate and possibly overcome limitations to have an impactful and balanced exercise routine.  Body Composition Clinical staff conducted group or individual video education with verbal and written material and guidebook.  Patient learns that body composition (ratio of muscle mass to fat mass) is a key component to assessing overall fitness, rather than body weight alone. Increased fat mass, especially visceral belly fat, can put [place] at increased risk for metabolic syndrome, type 2 diabetes, heart disease, and even death. It is recommended to combine diet and exercise (cardiovascular and resistance training) to improve your body composition. Seek guidance from your physician and exercise physiologist before implementing an exercise routine.  Exercise Action Plan Clinical staff conducted group or individual video education with verbal and written material and guidebook.  Patient learns the recommended strategies to achieve and enjoy long-term exercise adherence, including variety, self-motivation, self-efficacy, and positive decision making. Benefits of exercise include fitness, good health, weight management, more energy, better sleep, less stress, and overall well-being.  Medical   Heart Disease Risk Reduction Clinical staff conducted group or individual video education with verbal and written material and guidebook.  Patient learns our heart is our  most vital organ as it circulates oxygen, nutrients, white blood cells, and hormones throughout the entire body, and carries waste away. Data supports a plant-based eating plan like the Pritikin Program for its effectiveness in slowing progression of and reversing heart disease. The video provides a number of recommendations to address heart disease.   Metabolic Syndrome and Belly Fat  Clinical staff conducted group or individual video education with verbal and written material and guidebook.  Patient learns what metabolic syndrome is, how it leads to heart disease, and how one can reverse it and keep it from coming back. You have metabolic syndrome if you have 3 of the following 5 criteria: abdominal obesity, high blood pressure, high triglycerides, low HDL cholesterol, and high blood sugar.  Hypertension and Heart Disease Clinical staff conducted group or individual video education with verbal and written material and guidebook.  Patient learns that high blood pressure, or hypertension, is very common in the [place]. Hypertension is largely due to excessive salt intake, but other important risk factors include being overweight, physical inactivity, drinking too much alcohol, smoking, and not eating enough potassium from fruits and vegetables. High blood pressure is a leading risk factor for heart attack, stroke, congestive heart failure, dementia, kidney failure, and premature death. Long-term effects of excessive salt intake include stiffening of the arteries and thickening of heart muscle and organ damage. Recommendations include ways to reduce hypertension and the risk of heart disease.  Diseases of Our Time - Focusing on Diabetes Clinical staff conducted group or individual video education with verbal and written material and guidebook.  Patient learns why the best way to stop diseases of our time is prevention, through food and other lifestyle changes. Medicine (such as prescription pills  and surgeries) is often only a Band-Aid on the problem, not a long-term solution. Most common diseases of our time include obesity, type 2 diabetes, hypertension, heart disease, and cancer. The Pritikin Program is recommended and has been proven to help reduce, reverse, and/or prevent the damaging effects of metabolic syndrome.  Nutrition   Overview  of the [place] staff conducted group or individual video education with verbal and written material and guidebook.  Patient learns about the [place] for disease risk reduction. The [place] emphasizes a wide variety of unrefined, minimally-processed carbohydrates, like fruits, vegetables, whole grains, and legumes. Go, Caution, and Stop food choices are explained. Plant-based and lean animal proteins are emphasized. Rationale provided for low sodium intake for blood pressure control, low added sugars for blood sugar stabilization, and low added fats and oils for coronary artery disease risk reduction and weight management.  Calorie Density  Clinical staff conducted group or individual video education with verbal and written material and guidebook.  Patient learns about calorie density and how it impacts the Pritikin Eating Plan. Knowing the characteristics of the food you choose will help you decide whether those foods will lead to weight gain or weight loss, and whether you want to consume more or less of them. Weight loss is usually a side effect of the Pritikin Eating Plan because of its focus on low calorie-dense foods.  Label Reading  Clinical staff conducted group or individual video education with verbal and written material and guidebook.  Patient learns about the Pritikin recommended label reading guidelines and corresponding recommendations regarding calorie density, added sugars, sodium content, and whole grains.  Dining Out - Part 1  Clinical staff conducted group or individual video education  with verbal and written material and guidebook.  Patient learns that restaurant meals can be sabotaging because they can be so high in calories, fat, sodium, and/or sugar. Patient learns recommended strategies on how to positively address this and avoid unhealthy pitfalls.  Facts on Fats  Clinical staff conducted group or individual video education with verbal and written material and guidebook.  Patient learns that lifestyle modifications can be just as effective, if not more so, as many medications for lowering your risk of heart disease. A Pritikin lifestyle can help to reduce your risk of inflammation and atherosclerosis (cholesterol build-up, or plaque, in the artery walls). Lifestyle interventions such as dietary choices and physical activity address the cause of atherosclerosis. A review of the types of fats and their impact on blood cholesterol levels, along with dietary recommendations to reduce fat intake is also included.  Nutrition Action Plan  Clinical staff conducted group or individual video education with verbal and written material and guidebook.  Patient learns how to incorporate Pritikin recommendations into their lifestyle. Recommendations include planning and keeping personal health goals in mind as an important part of their success.  Healthy Mind-Set    Healthy Minds, Bodies, Hearts  Clinical staff conducted group or individual video education with verbal and written material and guidebook.  Patient learns how to identify when they are stressed. Video will discuss the impact of that stress, as well as the many benefits of stress management. Patient will also be introduced to stress management techniques. The way we think, act, and feel has an impact on our hearts.  How Our Thoughts Can Heal Our Hearts  Clinical staff conducted group or individual video education with verbal and written material and guidebook.  Patient learns that negative thoughts can cause depression and  anxiety. This can result in negative lifestyle behavior and serious health problems. Cognitive behavioral therapy is an effective method to help control our thoughts in order to change and improve our emotional outlook.  Additional Videos:  Exercise    Improving Performance  Clinical staff conducted group or individual video  education with verbal and written material and guidebook.  Patient learns to use a non-linear approach by alternating intensity levels and lengths of time spent exercising to help burn more calories and lose more body fat. Cardiovascular exercise helps improve heart health, metabolism, hormonal balance, blood sugar control, and recovery from fatigue. Resistance training improves strength, endurance, balance, coordination, reaction time, metabolism, and muscle mass. Flexibility exercise improves circulation, posture, and balance. Seek guidance from your physician and exercise physiologist before implementing an exercise routine and learn your capabilities and proper form for all exercise.  Introduction to Yoga  Clinical staff conducted group or individual video education with verbal and written material and guidebook.  Patient learns about yoga, a discipline of the coming together of mind, breath, and body. The benefits of yoga include improved flexibility, improved range of motion, better posture and core strength, increased lung function, weight loss, and positive self-image. Yoga's heart health benefits include lowered blood pressure, healthier heart rate, decreased cholesterol and triglyceride levels, improved immune function, and reduced stress. Seek guidance from your physician and exercise physiologist before implementing an exercise routine and learn your capabilities and proper form for all exercise.  Medical   Aging: Enhancing Your Quality of Life  Clinical staff conducted group or individual video education with verbal and written material and guidebook.  Patient learns  key strategies and recommendations to stay in good physical health and enhance quality of life, such as prevention strategies, having an advocate, securing a [place], and keeping a list of medications and system for tracking them. It also discusses how to avoid risk for bone loss.  Biology of Weight Control  Clinical staff conducted group or individual video education with verbal and written material and guidebook.  Patient learns that weight gain occurs because we consume more calories than we burn (eating more, moving less). Even if your body weight is normal, you may have higher ratios of fat compared to muscle mass. Too much body fat puts you at increased risk for cardiovascular disease, heart attack, stroke, type 2 diabetes, and obesity-related cancers. In addition to exercise, following the [place] can help reduce your risk.  Decoding Lab Results  Clinical staff conducted group or individual video education with verbal and written material and guidebook.  Patient learns that lab test reflects one measurement whose values change over time and are influenced by many factors, including medication, stress, sleep, exercise, food, hydration, pre-existing medical conditions, and more. It is recommended to use the knowledge from this video to become more involved with your lab results and evaluate your numbers to speak with your doctor.   Diseases of Our Time - Overview  Clinical staff conducted group or individual video education with verbal and written material and guidebook.  Patient learns that according to the CDC, 50% to 70% of chronic diseases (such as obesity, type 2 diabetes, elevated lipids, hypertension, and heart disease) are avoidable through lifestyle improvements including healthier food choices, listening to satiety cues, and increased physical activity.  Sleep Disorders Clinical staff conducted group or individual video education with verbal  and written material and guidebook.  Patient learns how good quality and duration of sleep are important to overall health and well-being. Patient also learns about sleep disorders and how they impact health along with recommendations to address them, including discussing with a physician.  Nutrition  Dining Out - Part 2 Clinical staff conducted group or individual video education with verbal and written material  and guidebook.  Patient learns how to plan ahead and communicate in order to maximize their dining experience in a healthy and nutritious manner. Included are recommended food choices based on the type of restaurant the patient is visiting.   Fueling a Best boy conducted group or individual video education with verbal and written material and guidebook.  There is a strong connection between our food choices and our health. Diseases like obesity and type 2 diabetes are very prevalent and are in large-part due to lifestyle choices. The Pritikin Eating Plan provides plenty of food and hunger-curbing satisfaction. It is easy to follow, affordable, and helps reduce health risks.  Menu Workshop  Clinical staff conducted group or individual video education with verbal and written material and guidebook.  Patient learns that restaurant meals can sabotage health goals because they are often packed with calories, fat, sodium, and sugar. Recommendations include strategies to plan ahead and to communicate with the manager, chef, or server to help order a healthier meal.  Planning Your Eating Strategy  Clinical staff conducted group or individual video education with verbal and written material and guidebook.  Patient learns about the [place] and its benefit of reducing the risk of disease. The [place] does not focus on calories. Instead, it emphasizes high-quality, nutrient-rich foods. By knowing the characteristics of the foods, we choose, we can  determine their calorie density and make informed decisions.  Targeting Your Nutrition Priorities  Clinical staff conducted group or individual video education with verbal and written material and guidebook.  Patient learns that lifestyle habits have a tremendous impact on disease risk and progression. This video provides eating and physical activity recommendations based on your personal health goals, such as reducing LDL cholesterol, losing weight, preventing or controlling type 2 diabetes, and reducing high blood pressure.  Vitamins and Minerals  Clinical staff conducted group or individual video education with verbal and written material and guidebook.  Patient learns different ways to obtain key vitamins and minerals, including through a recommended healthy diet. It is important to discuss all supplements you take with your doctor.   Healthy Mind-Set    Smoking Cessation  Clinical staff conducted group or individual video education with verbal and written material and guidebook.  Patient learns that cigarette smoking and tobacco addiction pose a serious health risk which affects millions of people. Stopping smoking will significantly reduce the risk of heart disease, lung disease, and many forms of cancer. Recommended strategies for quitting are covered, including working with your doctor to develop a successful plan.  Culinary   Becoming a Financial trader conducted group or individual video education with verbal and written material and guidebook.  Patient learns that cooking at home can be healthy, cost-effective, quick, and puts them in control. Keys to cooking healthy recipes will include looking at your recipe, assessing your equipment needs, planning ahead, making it simple, choosing cost-effective seasonal ingredients, and limiting the use of added fats, salts, and sugars.  Cooking - Breakfast and Snacks  Clinical staff conducted group or individual video education  with verbal and written material and guidebook.  Patient learns how important breakfast is to satiety and nutrition through the entire day. Recommendations include key foods to eat during breakfast to help stabilize blood sugar levels and to prevent overeating at meals later in the day. Planning ahead is also a key component.  Cooking - Human resources officer conducted group or individual video  education with verbal and written material and guidebook.  Patient learns eating strategies to improve overall health, including an approach to cook more at home. Recommendations include thinking of animal protein as a side on your plate rather than center stage and focusing instead on lower calorie dense options like vegetables, fruits, whole grains, and plant-based proteins, such as beans. Making sauces in large quantities to freeze for later and leaving the skin on your vegetables are also recommended to maximize your experience.  Cooking - Healthy Salads and Dressing Clinical staff conducted group or individual video education with verbal and written material and guidebook.  Patient learns that vegetables, fruits, whole grains, and legumes are the foundations of the [place]. Recommendations include how to incorporate each of these in flavorful and healthy salads, and how to create homemade salad dressings. Proper handling of ingredients is also covered. Cooking - Soups and [employer] - Soups and Desserts Clinical staff conducted group or individual video education with verbal and written material and guidebook.  Patient learns that Pritikin soups and desserts make for easy, nutritious, and delicious snacks and meal components that are low in sodium, fat, sugar, and calorie density, while high in vitamins, minerals, and filling fiber. Recommendations include simple and healthy ideas for soups and desserts.   Overview     The Pritikin Solution Program Overview Clinical staff  conducted group or individual video education with verbal and written material and guidebook.  Patient learns that the results of the [place] Program have been documented in more than 100 articles published in peer-reviewed journals, and the benefits include reducing risk factors for (and, in some cases, even reversing) high cholesterol, high blood pressure, type 2 diabetes, obesity, and more! An overview of the three key pillars of the Pritikin Program will be covered: eating well, doing regular exercise, and having a healthy mind-set.  WORKSHOPS  Exercise: Exercise Basics: Building Your Action Plan Clinical staff led group instruction and group discussion with PowerPoint presentation and patient guidebook. To enhance the learning environment the use of posters, models and videos may be added. At the conclusion of this workshop, patients will comprehend the difference between physical activity and exercise, as well as the benefits of incorporating both, into their routine. Patients will understand the FITT (Frequency, Intensity, Time, and Type) principle and how to use it to build an exercise action plan. In addition, safety concerns and other considerations for exercise and cardiac rehab will be addressed by the presenter. The purpose of this lesson is to promote a comprehensive and effective weekly exercise routine in order to improve patients' overall level of fitness.   Managing Heart Disease: Your Path to a Healthier Heart Clinical staff led group instruction and group discussion with PowerPoint presentation and patient guidebook. To enhance the learning environment the use of posters, models and videos may be added.At the conclusion of this workshop, patients will understand the anatomy and physiology of the heart. Additionally, they will understand how Pritikin's three pillars impact the risk factors, the progression, and the management of heart disease.  The purpose of this lesson is to  provide a high-level overview of the heart, heart disease, and how the Pritikin lifestyle positively impacts risk factors.  Exercise Biomechanics Clinical staff led group instruction and group discussion with PowerPoint presentation and patient guidebook. To enhance the learning environment the use of posters, models and videos may be added. Patients will learn how the structural parts of their bodies function and how these functions  impact their daily activities, movement, and exercise. Patients will learn how to promote a neutral spine, learn how to manage pain, and identify ways to improve their physical movement in order to promote healthy living. The purpose of this lesson is to expose patients to common physical limitations that impact physical activity. Participants will learn practical ways to adapt and manage aches and pains, and to minimize their effect on regular exercise. Patients will learn how to maintain good posture while sitting, walking, and lifting.  Balance Training and Fall Prevention  Clinical staff led group instruction and group discussion with PowerPoint presentation and patient guidebook. To enhance the learning environment the use of posters, models and videos may be added. At the conclusion of this workshop, patients will understand the importance of their sensorimotor skills (vision, proprioception, and the vestibular system) in maintaining their ability to balance as they age. Patients will apply a variety of balancing exercises that are appropriate for their current level of function. Patients will understand the common causes for poor balance, possible solutions to these problems, and ways to modify their physical environment in order to minimize their fall risk. The purpose of this lesson is to teach patients about the importance of maintaining balance as they age and ways to minimize their risk of falling.  WORKSHOPS   Nutrition:  Fueling a Chief Operating Officer led group instruction and group discussion with PowerPoint presentation and patient guidebook. To enhance the learning environment the use of posters, models and videos may be added. Patients will review the foundational principles of the [place] and understand what constitutes a serving size in each of the food groups. Patients will also learn Pritikin-friendly foods that are better choices when away from home and review make-ahead meal and snack options. Calorie density will be reviewed and applied to three nutrition priorities: weight maintenance, weight loss, and weight gain. The purpose of this lesson is to reinforce (in a group setting) the key concepts around what patients are recommended to eat and how to apply these guidelines when away from home by planning and selecting Pritikin-friendly options. Patients will understand how calorie density may be adjusted for different weight management goals.  Mindful Eating  Clinical staff led group instruction and group discussion with PowerPoint presentation and patient guidebook. To enhance the learning environment the use of posters, models and videos may be added. Patients will briefly review the concepts of the [place] and the importance of low-calorie dense foods. The concept of mindful eating will be introduced as well as the importance of paying attention to internal hunger signals. Triggers for non-hunger eating and techniques for dealing with triggers will be explored. The purpose of this lesson is to provide patients with the opportunity to review the basic principles of the [place], discuss the value of eating mindfully and how to measure internal cues of hunger and fullness using the Hunger Scale. Patients will also discuss reasons for non-hunger eating and learn strategies to use for controlling emotional eating.  Targeting Your Nutrition Priorities Clinical staff led group instruction  and group discussion with PowerPoint presentation and patient guidebook. To enhance the learning environment the use of posters, models and videos may be added. Patients will learn how to determine their genetic susceptibility to disease by reviewing their family history. Patients will gain insight into the importance of diet as part of an overall healthy lifestyle in mitigating the impact of genetics and other environmental insults. The purpose of  this lesson is to provide patients with the opportunity to assess their personal nutrition priorities by looking at their family history, their own health history and current risk factors. Patients will also be able to discuss ways of prioritizing and modifying the [place] for their highest risk areas  Menu  Clinical staff led group instruction and group discussion with PowerPoint presentation and patient guidebook. To enhance the learning environment the use of posters, models and videos may be added. Using menus brought in from [employer], or printed from [employer], patients will apply the [place] dining out guidelines that were presented in the [employer] video. Patients will also be able to practice these guidelines in a variety of provided scenarios. The purpose of this lesson is to provide patients with the opportunity to practice hands-on learning of the [place] with actual menus and practice scenarios.  Label Reading Clinical staff led group instruction and group discussion with PowerPoint presentation and patient guidebook. To enhance the learning environment the use of posters, models and videos may be added. Patients will review and discuss the Pritikin label reading guidelines presented in Pritikin's Label Reading Educational series video. Using fool labels brought in from local grocery stores and markets, patients will apply the label reading guidelines and determine if the packaged  food meet the Pritikin guidelines. The purpose of this lesson is to provide patients with the opportunity to review, discuss, and practice hands-on learning of the Pritikin Label Reading guidelines with actual packaged food labels. [place] Workshops are designed to teach patients ways to prepare quick, simple, and affordable recipes at home. The importance of nutrition's role in chronic disease risk reduction is reflected in its emphasis in the overall Pritikin program. By learning how to prepare essential core Pritikin Eating Plan recipes, patients will increase control over what they eat; be able to customize the flavor of foods without the use of added salt, sugar, or fat; and improve the quality of the food they consume. By learning a set of core recipes which are easily assembled, quickly prepared, and affordable, patients are more likely to prepare more healthy foods at home. These workshops focus on convenient breakfasts, simple entres, side dishes, and desserts which can be prepared with minimal effort and are consistent with nutrition recommendations for cardiovascular risk reduction. Cooking [employer] are taught by a Engineer, materials (RD) who has been trained by the [employer]. The chef or RD has a clear understanding of the importance of minimizing - if not completely eliminating - added fat, sugar, and sodium in recipes. Throughout the series of [place] Workshop sessions, patients will learn about healthy ingredients and efficient methods of cooking to build confidence in their capability to prepare    Cooking School weekly topics:  Adding Flavor- Sodium-Free  Fast and Healthy Breakfasts  Powerhouse Plant-Based Proteins  Satisfying Salads and Dressings  Simple Sides and Sauces  International Cuisine-Spotlight on the [place] Zones  Delicious Desserts  Savory Soups  Efficiency Cooking - Meals in a Snap  Tasty  Appetizers and Snacks  Comforting Weekend Breakfasts  One-Pot Wonders   Fast Evening Meals  Easy [place] (Psychosocial): New Thoughts, New Behaviors Clinical staff led group instruction and group discussion with PowerPoint presentation and patient guidebook. To enhance the learning environment the use of posters, models and videos may be added. Patients will  learn and practice techniques for developing effective health and lifestyle goals. Patients will be able to effectively apply the goal setting process learned to develop at least one new personal goal.  The purpose of this lesson is to expose patients to a new skill set of behavior modification techniques such as techniques setting SMART goals, overcoming barriers, and achieving new thoughts and new behaviors.  Managing Moods and Relationships Clinical staff led group instruction and group discussion with PowerPoint presentation and patient guidebook. To enhance the learning environment the use of posters, models and videos may be added. Patients will learn how emotional and chronic stress factors can impact their health and relationships. They will learn healthy ways to manage their moods and utilize positive coping mechanisms. In addition, ICR patients will learn ways to improve communication skills. The purpose of this lesson is to expose patients to ways of understanding how one's mood and health are intimately connected. Developing a healthy outlook can help build positive relationships and connections with others. Patients will understand the importance of utilizing effective communication skills that include actively listening and being heard. They will learn and understand the importance of the "4 Cs" and especially Connections in fostering of a Healthy Mind-Set.  Healthy Sleep for a Healthy Heart Clinical staff led group instruction and group discussion with  PowerPoint presentation and patient guidebook. To enhance the learning environment the use of posters, models and videos may be added. At the conclusion of this workshop, patients will be able to demonstrate knowledge of the importance of sleep to overall health, well-being, and quality of life. They will understand the symptoms of, and treatments for, common sleep disorders. Patients will also be able to identify daytime and nighttime behaviors which impact sleep, and they will be able to apply these tools to help manage sleep-related challenges. The purpose of this lesson is to provide patients with a general overview of sleep and outline the importance of quality sleep. Patients will learn about a few of the most common sleep disorders. Patients will also be introduced to the concept of "sleep hygiene," and discover ways to self-manage certain sleeping problems through simple daily behavior changes. Finally, the workshop will motivate patients by clarifying the links between quality sleep and their goals of heart-healthy living.   Recognizing and Reducing Stress Clinical staff led group instruction and group discussion with PowerPoint presentation and patient guidebook. To enhance the learning environment the use of posters, models and videos may be added. At the conclusion of this workshop, patients will be able to understand the types of stress reactions, differentiate between acute and chronic stress, and recognize the impact that chronic stress has on their health. They will also be able to apply different coping mechanisms, such as reframing negative self-talk. Patients will have the opportunity to practice a variety of stress management techniques, such as deep abdominal breathing, progressive muscle relaxation, and/or guided imagery.  The purpose of this lesson is to educate patients on the role of stress in their lives and to provide healthy techniques for coping with it.  Learning  Barriers/Preferences:  Learning Barriers/Preferences - 08/05/21 1408       Learning Barriers/Preferences   Learning Barriers Sight   wears glasses   Learning Preferences Computer/Internet;Skilled Demonstration;Video;Written Material;Group Instruction;Individual Instruction;Pictoral             Education Topics:  Knowledge Questionnaire Score:  Knowledge Questionnaire Score - 08/05/21 1409       Knowledge Questionnaire Score   Pre Score 19/24  Core Components/Risk Factors/Patient Goals at Admission:  Personal Goals and Risk Factors at Admission - 08/05/21 1411       Core Components/Risk Factors/Patient Goals on Admission    Weight Management Weight Maintenance    Lipids Yes    Intervention Provide education and support for participant on nutrition & aerobic/resistive exercise along with prescribed medications to achieve LDL <34m, HDL >410m    Expected Outcomes Short Term: Participant states understanding of desired cholesterol values and is compliant with medications prescribed. Participant is following exercise prescription and nutrition guidelines.;Long Term: Cholesterol controlled with medications as prescribed, with individualized exercise RX and with personalized nutrition plan. Value goals: LDL < 7046mHDL > 40 mg.             Core Components/Risk Factors/Patient Goals Review:   Goals and Risk Factor Review     Row Name 08/12/21 0907121/25/23 1737 10/07/21 1739         Core Components/Risk Factors/Patient Goals Review   Personal Goals Review Weight Management/Obesity;Lipids Weight Management/Obesity;Lipids Weight Management/Obesity;Lipids     Review DenNeetaarted cardiac rehab on 08/11/21. DenBryonad well with exercise. Vital signs were stable. DenHartlees been doing well with exercise at intensive cardiac rehab and has not reported any more chest pain or indigestion during exercise since her ED visit. DenTayjahs been doing well with exercise at  intensive cardiac rehab. Vital signs and weights have been stable.     Expected Outcomes DenDafneyll continue to participate inphase 2 cardiac rehab for exercise, nutrition and lifestyel modifications DenIsadorall continue to participate intensive cardiac rehab for exercise, nutrition and lifestyel modifications DenMiladyll continue to participate intensive cardiac rehab for exercise, nutrition and lifestyel modifications              Core Components/Risk Factors/Patient Goals at Discharge (Final Review):   Goals and Risk Factor Review - 10/07/21 1739       Core Components/Risk Factors/Patient Goals Review   Personal Goals Review Weight Management/Obesity;Lipids    Review DenEvins been doing well with exercise at intensive cardiac rehab. Vital signs and weights have been stable.    Expected Outcomes DenJadynll continue to participate intensive cardiac rehab for exercise, nutrition and lifestyel modifications             ITP Comments:  ITP Comments     Row Name 08/05/21 1218 08/12/21 0846 09/09/21 1735 10/07/21 1737     ITP Comments Dr TraFransico Him, Medical Director, Introduction to Pritikin Education/ Intensive Cardiac Rehab Program. Initial Pritikin Orientation Packet reviewed with the patient. 30 Day ITP Review. Denis started Intensive Cardiac Rehab on 08/11/21. DenTasiad well with exercise. 30 Day ITP Review. DenElloras good attendance and participation in intensive cardiac rehab. 30 Day ITP Review. DenKhamilas good attendance and participation in intensive cardiac rehab. DenDevonnall complete intensive cardiac rehab on 10/17/21.             Comments: See ITP comments.MarHarrell Gave BSN

## 2021-10-08 ENCOUNTER — Encounter (HOSPITAL_COMMUNITY)
Admission: RE | Admit: 2021-10-08 | Discharge: 2021-10-08 | Disposition: A | Discharge status: Home / Self Care | Payer: Medicare PPO | Source: Ambulatory Visit | Attending: Cardiovascular Disease | Admitting: Cardiovascular Disease

## 2021-10-08 DIAGNOSIS — Z955 Presence of coronary angioplasty implant and graft: Secondary | ICD-10-CM | POA: Diagnosis not present

## 2021-10-08 DIAGNOSIS — Z48812 Encounter for surgical aftercare following surgery on the circulatory system: Secondary | ICD-10-CM | POA: Diagnosis not present

## 2021-10-10 ENCOUNTER — Other Ambulatory Visit: Payer: Self-pay | Admitting: Allergy and Immunology

## 2021-10-10 ENCOUNTER — Encounter (HOSPITAL_COMMUNITY)
Admission: RE | Admit: 2021-10-10 | Discharge: 2021-10-10 | Disposition: A | Discharge status: Home / Self Care | Payer: Medicare PPO | Source: Ambulatory Visit | Attending: Cardiovascular Disease | Admitting: Cardiovascular Disease

## 2021-10-10 ENCOUNTER — Other Ambulatory Visit (HOSPITAL_COMMUNITY): Payer: Self-pay

## 2021-10-10 DIAGNOSIS — Z48812 Encounter for surgical aftercare following surgery on the circulatory system: Secondary | ICD-10-CM | POA: Diagnosis not present

## 2021-10-10 DIAGNOSIS — Z955 Presence of coronary angioplasty implant and graft: Secondary | ICD-10-CM | POA: Diagnosis not present

## 2021-10-10 MED ORDER — TRIAMCINOLONE ACETONIDE 55 MCG/ACT NA AERO
2.0000 | INHALATION_SPRAY | Freq: Every day | NASAL | 5 refills | Status: DC
Start: 2021-10-10 — End: 2022-01-20
  Filled 2021-10-10 – 2021-12-14 (×2): qty 16.5, 30d supply, fill #0

## 2021-10-13 ENCOUNTER — Encounter (HOSPITAL_COMMUNITY)
Admission: RE | Admit: 2021-10-13 | Discharge: 2021-10-13 | Disposition: A | Discharge status: Home / Self Care | Payer: Medicare PPO | Source: Ambulatory Visit | Attending: Cardiovascular Disease | Admitting: Cardiovascular Disease

## 2021-10-13 DIAGNOSIS — Z48812 Encounter for surgical aftercare following surgery on the circulatory system: Secondary | ICD-10-CM | POA: Diagnosis not present

## 2021-10-13 DIAGNOSIS — Z955 Presence of coronary angioplasty implant and graft: Secondary | ICD-10-CM

## 2021-10-15 ENCOUNTER — Other Ambulatory Visit (HOSPITAL_COMMUNITY): Payer: Self-pay

## 2021-10-15 ENCOUNTER — Other Ambulatory Visit: Payer: Self-pay | Admitting: Allergy and Immunology

## 2021-10-15 ENCOUNTER — Encounter (HOSPITAL_COMMUNITY)
Admission: RE | Admit: 2021-10-15 | Discharge: 2021-10-15 | Disposition: A | Discharge status: Home / Self Care | Payer: Medicare PPO | Source: Ambulatory Visit | Attending: Cardiovascular Disease | Admitting: Cardiovascular Disease

## 2021-10-15 ENCOUNTER — Other Ambulatory Visit: Payer: Self-pay | Admitting: Internal Medicine

## 2021-10-15 ENCOUNTER — Other Ambulatory Visit: Payer: Self-pay | Admitting: Gastroenterology

## 2021-10-15 DIAGNOSIS — Z48812 Encounter for surgical aftercare following surgery on the circulatory system: Secondary | ICD-10-CM | POA: Diagnosis not present

## 2021-10-15 DIAGNOSIS — Z955 Presence of coronary angioplasty implant and graft: Secondary | ICD-10-CM

## 2021-10-15 MED ORDER — OMEGA-3-ACID ETHYL ESTERS 1 G PO CAPS
ORAL_CAPSULE | ORAL | 0 refills | Status: DC
  Filled 2021-10-15: qty 360, 90d supply, fill #0

## 2021-10-16 ENCOUNTER — Ambulatory Visit
Admission: RE | Admit: 2021-10-16 | Discharge: 2021-10-16 | Disposition: A | Discharge status: Home / Self Care | Payer: Medicare PPO | Source: Ambulatory Visit | Attending: Neurology | Admitting: Neurology

## 2021-10-16 ENCOUNTER — Other Ambulatory Visit (HOSPITAL_COMMUNITY): Payer: Self-pay

## 2021-10-16 DIAGNOSIS — I6523 Occlusion and stenosis of bilateral carotid arteries: Secondary | ICD-10-CM | POA: Diagnosis not present

## 2021-10-16 DIAGNOSIS — M25561 Pain in right knee: Secondary | ICD-10-CM | POA: Diagnosis not present

## 2021-10-16 DIAGNOSIS — R29818 Other symptoms and signs involving the nervous system: Secondary | ICD-10-CM | POA: Diagnosis not present

## 2021-10-16 DIAGNOSIS — G453 Amaurosis fugax: Secondary | ICD-10-CM

## 2021-10-16 MED ORDER — IOPAMIDOL (ISOVUE-370) INJECTION 76%
80.0000 mL | Freq: Once | INTRAVENOUS | Status: AC | PRN
  Administered 2021-10-16: 80 mL via INTRAVENOUS

## 2021-10-17 ENCOUNTER — Encounter (HOSPITAL_COMMUNITY)
Admission: RE | Admit: 2021-10-17 | Discharge: 2021-10-17 | Disposition: A | Discharge status: Home / Self Care | Payer: Medicare PPO | Source: Ambulatory Visit | Attending: Cardiovascular Disease | Admitting: Cardiovascular Disease

## 2021-10-17 ENCOUNTER — Encounter: Payer: Self-pay | Admitting: Neurology

## 2021-10-17 VITALS — Ht 65.0 in | Wt 145.9 lb

## 2021-10-17 DIAGNOSIS — Z955 Presence of coronary angioplasty implant and graft: Secondary | ICD-10-CM | POA: Diagnosis not present

## 2021-10-17 DIAGNOSIS — G4733 Obstructive sleep apnea (adult) (pediatric): Secondary | ICD-10-CM | POA: Diagnosis not present

## 2021-10-17 DIAGNOSIS — I2510 Atherosclerotic heart disease of native coronary artery without angina pectoris: Secondary | ICD-10-CM | POA: Insufficient documentation

## 2021-10-17 DIAGNOSIS — Z4889 Encounter for other specified surgical aftercare: Secondary | ICD-10-CM | POA: Insufficient documentation

## 2021-10-17 DIAGNOSIS — I471 Supraventricular tachycardia: Secondary | ICD-10-CM | POA: Diagnosis not present

## 2021-10-17 DIAGNOSIS — Z01818 Encounter for other preprocedural examination: Secondary | ICD-10-CM | POA: Insufficient documentation

## 2021-10-17 DIAGNOSIS — E785 Hyperlipidemia, unspecified: Secondary | ICD-10-CM | POA: Insufficient documentation

## 2021-10-17 DIAGNOSIS — J849 Interstitial pulmonary disease, unspecified: Secondary | ICD-10-CM | POA: Insufficient documentation

## 2021-10-19 DIAGNOSIS — J479 Bronchiectasis, uncomplicated: Secondary | ICD-10-CM | POA: Diagnosis not present

## 2021-10-20 ENCOUNTER — Other Ambulatory Visit (HOSPITAL_COMMUNITY): Payer: Self-pay

## 2021-10-21 ENCOUNTER — Other Ambulatory Visit (HOSPITAL_COMMUNITY): Payer: Self-pay

## 2021-10-21 ENCOUNTER — Encounter (INDEPENDENT_AMBULATORY_CARE_PROVIDER_SITE_OTHER): Payer: Medicare PPO | Admitting: Physician Assistant

## 2021-10-21 VITALS — BP 128/62 | HR 64 | Ht 65.0 in | Wt 142.8 lb

## 2021-10-21 DIAGNOSIS — I471 Supraventricular tachycardia, unspecified: Secondary | ICD-10-CM

## 2021-10-21 DIAGNOSIS — J849 Interstitial pulmonary disease, unspecified: Secondary | ICD-10-CM | POA: Diagnosis not present

## 2021-10-21 DIAGNOSIS — G4733 Obstructive sleep apnea (adult) (pediatric): Secondary | ICD-10-CM

## 2021-10-21 DIAGNOSIS — E785 Hyperlipidemia, unspecified: Secondary | ICD-10-CM | POA: Diagnosis not present

## 2021-10-21 DIAGNOSIS — Z01818 Encounter for other preprocedural examination: Secondary | ICD-10-CM | POA: Diagnosis not present

## 2021-10-21 DIAGNOSIS — I2510 Atherosclerotic heart disease of native coronary artery without angina pectoris: Secondary | ICD-10-CM

## 2021-10-21 MED ORDER — COLESTIPOL HCL 1 G PO TABS
1.0000 g | ORAL_TABLET | Freq: Every day | ORAL | 1 refills | Status: DC
  Filled 2021-10-21 – 2021-12-14 (×2): qty 90, 90d supply, fill #0
  Filled 2022-03-12: qty 90, 90d supply, fill #1

## 2021-10-21 NOTE — Progress Notes (Signed)
Cardiology Office Note:    Date:  10/21/2021   ID:  Beverly [name], Beverly [name] 1945/07/25, MRN [id number]  PCP:  Isaac Bliss, Rayford Halsted, MD  [place] [facility] Cardiologist:  [name], MD     Referring MD: Isaac Bliss, [name]   Chief Complaint:  No chief complaint on file.     History of Present Illness:   Beverly [name] is a 76 y.o. female with  history of coronary calcification on CT 06/2019,SVT/nocturnal bradycardia, chest pain with normal NST 06/2019, HLD, OSA, ILD with severe tracheobronchomalacia followed by pulmonary. I saw the patient for preop evaluation for potential lumbar surgery on 05/28/21 Dr. Rolena Infante with abdominal exposure for OLIF at L3-L5 with Dr. Carlis Abbott.   I ordered Coronary CTA and this was suggestive of tight LAD and ramus. Surgery was canceled and she had cardiac cath 05/19/21 with DES LAD and DES ramus. Plan for Brilinta/ASA for at least 6 months. Counseled on taking celebrex but she can't stop b/c of pain. Low dose losartan added.    Patient seen in ED 08/20/21 with recurrent chest pain felt to be M-S and reflux related. EKG unchanged and troponins negative. No further w/u recommended.     Patient comes in for f/u. Finishes cardiac rehab tomorrow. Had a couple times when BP was up but she monitors it at home. Signed up for silver sneakers. She wants clearance for back surgery. Her pain management MD raised her meds to highest dose.  Dr. Melina Schools at [facility] to do her surgery-L5-L6 fusion per patient.     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   right sholder   BCC (basal cell carcinoma of skin) 05/09/2014   mid lower back   BCC (basal cell carcinoma of skin) 05/03/2017   right low back    BCC (basal cell carcinoma of skin) 05/03/2017   left upper back   BCC (basal cell carcinoma of skin) 07/05/2018   left mid back   BCC (basal cell carcinoma of skin) 05/20/1992   upper back   BCC (basal cell carcinoma of skin) 07/29/1993   left sholder medial   BCC (basal cell carcinoma of skin) 07/29/1993   left sholder lateral   BCC (basal cell carcinoma of skin) 07/29/1993   right thigh   BCC (basal cell carcinoma of skin) 07/29/1993   right sholder   BCC (basal cell carcinoma of skin) 12/22/1994   right mid forearm   BCC (basal cell carcinoma of skin) 12/22/1994   right upper forearm   BCC (basal cell carcinoma of skin) 12/22/1994   lower right upper arm   BCC (basal cell carcinoma of skin) 12/22/1994   right upper arm sholder   BCC (basal cell carcinoma of skin) 08/11/1995   left leg below knee   BCC (basal cell carcinoma of skin) 04/11/2002   mid back   BCC (basal cell carcinoma of skin) 12/10/2002   right center upper back   BCC (basal cell carcinoma of skin) 05/26/2005   right post sholder   BCC (basal cell carcinoma) 05/09/2014   left inner shin   BCC (basal cell carcinoma) 06/12/2014   left forearm  Bowen's disease 10/07/1994   right post knee, right inner forearm/wrist   Bowen's disease 08/11/1995   left sholder   Bronchiectasis ([place])    Cataract    left   Chronic pain    Common variable immunodeficiency [facility])    sees dr Gayleen Orem 02-11-2021 epic   Coronary artery disease    Depression    hx of   Diverticulosis of colon 1998   mild   DJD (degenerative joint disease)    Duodenal ulcer    h/o yrs ago   GERD (gastroesophageal reflux disease)    History of SCC (squamous cell carcinoma) of skin    Dr. Denna Haggard   History of sinus bradycardia    HLD (hyperlipidemia)    hypertriglyceridemia   Hypertensive retinopathy of both eyes 11/18/2017   Hypothyroidism    IBS (irritable bowel syndrome)    Internal hemorrhoids 1998   Lymphocytic colitis    Mitral  regurgitation    mild   Nocardiosis    relased by infection disease dec 2022   Osteoarthritis    feet,shoulder,neck,back,hips and hands.   Pneumonia 2015   PONV (postoperative nausea and vomiting)    Rotator cuff tear, right 02/2019   infraspinatus and supraspinatus, and dislocation of long head of bicep tendon (Dr. Alvan Dame)   SCC (squamous cell carcinoma) 11/26/2014   left hand, right hand, right deltoid mnay areas   SCC (squamous cell carcinoma) 05/03/2017   left cheek   Sleep apnea    uses a mouth guard nightly   Squamous cell carcinoma in situ (SCCIS) 07/05/2018   left hand   Tracheobronchomalacia    Vitamin D deficiency    mild   Current Medications: Current Meds  Medication Sig   acetaminophen (TYLENOL) 500 MG tablet Take 1,000 mg by mouth every 6 (six) hours as needed for moderate pain.    albuterol (VENTOLIN HFA) 108 (90 Base) MCG/ACT inhaler Inhale 2 puffs into the lungs every 6 (six) hours as needed for wheezing or shortness of breath.   aspirin EC 81 MG tablet Take 81 mg by mouth daily.   atorvastatin (LIPITOR) 40 MG tablet Take 1 tablet by mouth daily.   budesonide (ENTOCORT EC) 3 MG 24 hr capsule Take 3 capsules (9 mg total) by mouth daily for 28 days, THEN 2 capsules (6 mg total) daily for 56 days, THEN 1 capsule (3 mg total) daily.   Buprenorphine HCl (BELBUCA) 750 MCG FILM Place 1 film to inside of mouth every 12 hours   Calcium Carbonate-Vitamin D 600-200 MG-UNIT TABS Take 1 tablet by mouth 2 (two) times daily.   celecoxib (CELEBREX) 200 MG capsule TAKE 1 CAPSULE BY MOUTH 2 TIMES DAILY   colestipol (COLESTID) 1 g tablet Take 1 tablet by mouth daily.   diclofenac Sodium (VOLTAREN) 1 % GEL Apply 2 g topically 4 (four) times daily as needed (joint pain).   Docusate Sodium 100 MG capsule Take 100 mg by mouth daily.   EPINEPHrine 0.3 mg/0.3 mL IJ SOAJ injection Inject 0.3 mg into the muscle as needed for anaphylaxis.   famotidine (PEPCID) 40 MG tablet Take 1 tablet by  mouth at bedtime.   Fluorouracil (TOLAK) 4 % CREA Apply to aa total 28 applications & sun protect   gabapentin (NEURONTIN) 800 MG tablet Take 1 tablet by mouth at bedtime.   guaiFENesin (MUCINEX) 600 MG 12 hr tablet Take 600 mg by mouth 2 (two) times daily as needed for cough or to loosen phlegm.  HYDROcodone-acetaminophen (NORCO) 7.5-325 MG tablet Take 1 tablet by mouth four times a day as needed for pain   ipratropium (ATROVENT) 0.03 % nasal spray Place 1 spray into both nostrils 3 (three) times daily.   Lactobacillus-Inulin (CULTURELLE ADULT ULT BALANCE) CAPS Take 1 capsule by mouth daily.   levothyroxine (SYNTHROID) 25 MCG tablet Take 1 tablet (25 mcg total) by mouth daily.   loratadine (CLARITIN) 10 MG tablet Take 1 tablet (10 mg total) by mouth daily as needed for allergies (Can take an extra dose during flare ups.).   Melatonin-Pyridoxine (MELATIN PO) Take 5 mg by mouth at bedtime.   methocarbamol (ROBAXIN) 500 MG tablet Take 1 tablet by mouth three times a day as needed   montelukast (SINGULAIR) 10 MG tablet Take 1 tablet (10 mg total) by mouth at bedtime.   Multiple Vitamins-Minerals (MULTIVITAMIN WITH MINERALS) tablet Take 1 tablet by mouth daily.   nitroGLYCERIN (NITROSTAT) 0.4 MG SL tablet Place 1 tablet (0.4 mg total) under the tongue every 5 (five) minutes as needed.   omega-3 acid ethyl esters (LOVAZA) 1 g capsule TAKE 2 CAPSULES BY MOUTH 2 TIMES DAILY   ondansetron (ZOFRAN-ODT) 4 MG disintegrating tablet Take 1 tablet by mouth every 6 (six) hours as needed for nausea or vomiting.   pantoprazole (PROTONIX) 40 MG tablet Take 1 tablet (40 mg total) by mouth daily with breakfast.   PARoxetine (PAXIL) 30 MG tablet Take 1 tablet by mouth once a day.   ticagrelor (BRILINTA) 90 MG TABS tablet Take 1 tablet by mouth 2 times daily. (stop Plavix (clopidogrel))   triamcinolone (NASACORT) 55 MCG/ACT AERO nasal inhaler Place 2 sprays into each nostril once daily.   Vitamin D, Ergocalciferol,  (DRISDOL) 1.25 MG (50000 UNIT) CAPS capsule Take 1 capsule (50,000 Units total) by mouth every 7 (seven) days for 12 doses.   XEMBIFY 10 GM/50ML SOLN Inject 10 g into the skin once a week.    Allergies:   Plavix [clopidogrel], Adhesive [tape], and Codeine   Social History   Tobacco Use   Smoking status: Never   Smokeless tobacco: Never  Vaping Use   Vaping Use: Never used  Substance Use Topics   Alcohol use: Yes    Alcohol/week: 7.0 standard drinks of alcohol    Types: 7 Glasses of wine per week    Comment: 1 glass wine per day   Drug use: No    Family Hx: The patient's family history includes ADD / ADHD in an other family member; Asthma in her son; COPD in her sister; Cancer in her brother, father, and sister; Cancer (age of onset: 55) in her paternal grandmother; Colon cancer in her paternal grandmother; Depression in her mother; Diabetes in her maternal grandmother; Esophageal cancer in her father; Heart disease in her father; Hyperlipidemia in her daughter; Hypertension in her sister; Hyperthyroidism in her brother; Liver cancer in her paternal grandmother; Osteoporosis in her sister; Schizophrenia in her mother.  ROS   EKGs/Labs/Other Test Reviewed:    EKG:  EKG is  not ordered today.     Recent Labs: 11/20/2020: ALT 29; Magnesium 2.0 08/12/2021: TSH 1.33 08/20/2021: BUN 12; Creatinine, Ser 0.66; Hemoglobin 12.6; Platelets 156; Potassium 4.2; Sodium 140   Recent Lipid Panel Recent Labs    11/20/20 1630  CHOL 137  TRIG 129.0  HDL 40.90  VLDL 25.8  LDLCALC 70     Prior CV Studies:    Cath: 05/19/21   Conclusions: Severe two-vessel coronary artery disease with up  to 90% stenoses involving the ostial through mid LAD and ostial through proximal ramus intermedius.  Mild, nonobstructive CAD noted in the LCx and RCA. Normal left ventricular systolic function (LVEF 40-34%) with normal filling pressure (LVEDP 10 mmHg) Successful IVUS-guided PCI to ostial through mid LAD using  Onyx Frontier 2.75 x 38 mm drug-eluting stent with 0% residual stenosis and TIMI-3 flow Successful PCI to ostial through proximal ramus intermedius using Onyx Frontier 2.0 x 22 mm drug-eluting stent with 0% residual stenosis and TIMI-3 flow.   Recommendations: Overnight extended recovery. Dual antiplatelet therapy with aspirin and clopidogrel, ideally for 6 months.  Nonemergent surgery will likely need to be delayed to allow for adequate antiplatelet therapy following two-vessel PCI. Aggressive secondary prevention.   Nelva Bush, MD [facility]   Diagnostic Dominance: Right Intervention        Echo from 06/23/19:    1. Left ventricular ejection fraction, by estimation, is 55 to 60%. The  left ventricle has normal function. The left ventricle has no regional  wall motion abnormalities. There is mild left ventricular hypertrophy.  Left ventricular diastolic function  could not be evaluated.   2. Right ventricular systolic function is normal. The right ventricular  size is normal.   3. The mitral valve is grossly normal. Trivial mitral valve  regurgitation. No evidence of mitral stenosis.   4. The aortic valve has an indeterminant number of cusps. Aortic valve  regurgitation is not visualized. Mild aortic valve sclerosis is present,  with no evidence of aortic valve stenosis.      Laboratory Data:      Risk Assessment/Calculations/Metrics:              Physical Exam:    VS:  BP 128/62   Pulse 64   Ht '5\' 5"'$  (1.651 m)   Wt 142 lb 12.8 oz (64.8 kg)   SpO2 98%   BMI 23.76 kg/m     Wt Readings from Last 3 Encounters:  10/21/21 142 lb 12.8 oz (64.8 kg)  10/17/21 145 lb 15.1 oz (66.2 kg)  10/03/21 146 lb 12.8 oz (66.6 kg)       GEN: Well nourished, well developed, in no acute distress  Neck: no JVD, carotid bruits, or masses Cardiac:RRR; no murmurs, rubs, or gallops  Respiratory:  clear to auscultation bilaterally, normal work of breathing GI: soft,  nontender, nondistended, + BS Ext: without cyanosis, clubbing, or edema, Good distal pulses bilaterally Neuro:  Alert and Oriented x 3,  Psych: euthymic mood, full affect   ASSESSMENT & PLAN:   No problem-specific Assessment & Plan notes found for this encounter.   Preop clearance for L5-6 back surgery by Dr. Rutherford Guys. Patient was scheduled last April but was canceled with stent placement.  Patient will complete cardaic rehab tomorrow and has done well without cardiac symptoms. Will review with Dr. Johnsie Cancel to see if he's ok holding her Brilinta/ASA for surgery.  According to the Revised Cardiac Risk Index (RCRI), her Perioperative Risk of Major Cardiac Event is (%): 0.9  Her Functional Capacity in METs is: 5.62 according to the [employer] Activity Status Index (DASI).  CAD cardiac cath 05/19/21 with DES LAD and DES ramus. Plan for Plavix/ASA for at least 6 months. recurrent chest pain and seen in ED 08/20/21-felt to be atypical and no further w/u needed.   ILD-with chronic DOE   SVT/nocturnal bradycardia-no recent symptoms   HLD LDL 70 on lipitor   OSA  Dispo:  No follow-ups on file.   Medication Adjustments/Labs and Tests Ordered: Current medicines are reviewed at length with the patient today.  Concerns regarding medicines are outlined above.  Tests Ordered: No orders of the defined types were placed in this encounter.  Medication Changes: No orders of the defined types were placed in this encounter.  Signed, Ermalinda Barrios, PA-C  10/21/2021 1:45 PM    [address], [place], [place]  [address] Phone: [phone]; Fax: [phone]

## 2021-10-21 NOTE — Patient Instructions (Signed)
Medication Instructions:  Your physician recommends that you continue on your current medications as directed. Please refer to the Current Medication list given to you today.  *If you need a refill on your cardiac medications before your next appointment, please call your pharmacy*   Lab Work: None If you have labs (blood work) drawn today and your tests are completely normal, you will receive your results only by: MyChart Message (if you have MyChart) OR A paper copy in the mail If you have any lab test that is abnormal or we need to change your treatment, we will call you to review the results.   Follow-Up: At [facility], you and your health needs are our priority.  As part of our continuing mission to provide you with exceptional heart care, we have created designated Provider Care Teams.  These Care Teams include your primary Cardiologist (physician) and Advanced Practice Providers (APPs -  Physician Assistants and Nurse Practitioners) who all work together to provide you with the care you need, when you need it.  Your next appointment:   6 month(s)  The format for your next appointment:   In Person  Provider:   [name] Nishan, MD    Important Information About Sugar       

## 2021-10-21 NOTE — Telephone Encounter (Signed)
Patient should go thru her PMD to get the referral. I have seen her for hyperhidrosis and episode of amaurosis fugax.

## 2021-10-22 ENCOUNTER — Other Ambulatory Visit (HOSPITAL_COMMUNITY): Payer: Self-pay

## 2021-10-22 ENCOUNTER — Encounter (HOSPITAL_COMMUNITY)
Admission: RE | Admit: 2021-10-22 | Discharge: 2021-10-22 | Disposition: A | Discharge status: Home / Self Care | Payer: Medicare PPO | Source: Ambulatory Visit | Attending: Cardiovascular Disease | Admitting: Cardiovascular Disease

## 2021-10-22 ENCOUNTER — Encounter: Payer: Self-pay | Admitting: Internal Medicine

## 2021-10-22 DIAGNOSIS — Z01818 Encounter for other preprocedural examination: Secondary | ICD-10-CM | POA: Diagnosis not present

## 2021-10-22 DIAGNOSIS — I471 Supraventricular tachycardia: Secondary | ICD-10-CM | POA: Diagnosis not present

## 2021-10-22 DIAGNOSIS — Z4889 Encounter for other specified surgical aftercare: Secondary | ICD-10-CM | POA: Diagnosis not present

## 2021-10-22 DIAGNOSIS — Z955 Presence of coronary angioplasty implant and graft: Secondary | ICD-10-CM | POA: Diagnosis not present

## 2021-10-22 DIAGNOSIS — G4733 Obstructive sleep apnea (adult) (pediatric): Secondary | ICD-10-CM | POA: Diagnosis not present

## 2021-10-22 DIAGNOSIS — J849 Interstitial pulmonary disease, unspecified: Secondary | ICD-10-CM | POA: Diagnosis not present

## 2021-10-22 DIAGNOSIS — I2510 Atherosclerotic heart disease of native coronary artery without angina pectoris: Secondary | ICD-10-CM | POA: Diagnosis not present

## 2021-10-22 DIAGNOSIS — E785 Hyperlipidemia, unspecified: Secondary | ICD-10-CM | POA: Diagnosis not present

## 2021-10-22 NOTE — Progress Notes (Signed)
Discharge Progress Report  Patient Details  Name: JALYNN WADDELL MRN: [id number] Date of Birth: 1945-09-27 Referring Provider:   Flowsheet Row INTENSIVE CARDIAC REHAB ORIENT from 08/05/2021 in [place]  Referring Provider [name], MD        Number of Visits: 24  Reason for Discharge:  Patient reached a stable level of exercise. Patient independent in their exercise. Patient has met program and personal goals.  Smoking History:  Social History   Tobacco Use  Smoking Status Never  Smokeless Tobacco Never    Diagnosis:  05/19/21 S/P DES x 2 LAD, Ramus  ADL UCSD:   Initial Exercise Prescription:  Initial Exercise Prescription - 08/05/21 1400       Date of Initial Exercise RX and Referring Provider   Date 08/05/21    Referring Provider [name], MD    Expected Discharge Date 10/03/21      NuStep   Level 2    SPM 75    Minutes 30    METs 2.5      Prescription Details   Frequency (times per week) 3    Duration Progress to 30 minutes of continuous aerobic without signs/symptoms of physical distress      Intensity   THRR 40-80% of Max Heartrate 59-118    Ratings of Perceived Exertion 11-13    Perceived Dyspnea 0-4      Progression   Progression Continue progressive overload as per policy without signs/symptoms or physical distress.      Resistance Training   Training Prescription Yes    Weight 2 lbs    Reps 10-15             Discharge Exercise Prescription (Final Exercise Prescription Changes):  Exercise Prescription Changes - 10/22/21 1015       Response to Exercise   Blood Pressure (Admit) 110/58    Blood Pressure (Exercise) 144/82    Blood Pressure (Exit) 124/74    Heart Rate (Admit) 67 bpm    Heart Rate (Exercise) 113 bpm    Heart Rate (Exit) 71 bpm    Rating of Perceived Exertion (Exercise) 12    Symptoms None    Comments Pt graduated from the [facility]2 program today    Duration Continue with 30  min of aerobic exercise without signs/symptoms of physical distress.    Intensity THRR unchanged      Progression   Progression Continue to progress workloads to maintain intensity without signs/symptoms of physical distress.    Average METs 5.1      Resistance Training   Training Prescription No    Weight No weights on Wednesday      Interval Training   Interval Training No      NuStep   Level 4    Minutes 30    METs 5.1      Home Exercise Plan   Plans to continue exercise at [facility] (comment)    Frequency Add 4 additional days to program exercise sessions.    Initial Home Exercises Provided 09/03/21             Functional Capacity:  6 Minute Walk     Row Name 08/05/21 1412 10/17/21 0915       6 Minute Walk   Phase Initial Discharge    Distance 1211 feet 1853 feet    Distance % Change -- 53.01 %    Distance Feet Change -- 642 ft    Walk Time 6 minutes  6 minutes    # of Rest Breaks 0 0    MPH 2.3 3.51    METS 2.53 4.31    RPE 11 13    Perceived Dyspnea  0 0    VO2 Peak 8.85 15.07    Symptoms No No    Resting HR 70 bpm 62 bpm    Resting BP 114/60 122/78    Resting Oxygen Saturation  98 % 97 %    Exercise Oxygen Saturation  during 6 min walk 98 % 98 %    Max Ex. HR 75 bpm 106 bpm    Max Ex. BP 142/64 156/70    2 Minute Post BP 130/64 --             Psychological, QOL, Others - Outcomes: PHQ 2/9:    10/22/2021    9:15 AM 08/12/2021   10:52 AM 08/05/2021    1:20 PM 12/10/2020    2:26 PM 11/20/2020    3:28 PM  Depression screen PHQ 2/9  Decreased Interest 0 0 0 0 0  Down, Depressed, Hopeless 0 0 0 0 0  PHQ - 2 Score 0 0 0 0 0  Altered sleeping  0   0  Tired, decreased energy  0   0  Change in appetite  0   0  Feeling bad or failure about yourself   0   0  Trouble concentrating  0   0  Moving slowly or fidgety/restless  0   0  Suicidal thoughts  0   0  PHQ-9 Score  0   0  Difficult doing work/chores  Not difficult at all        Quality of Life:  Quality of Life - 10/08/21 1015       Quality of Life   Select Quality of Life      Quality of Life Scores   Health/Function Pre 26.46 %    Health/Function Post 28.71 %    Health/Function % Change 8.5 %    Socioeconomic Pre 30 %    Socioeconomic Post 27.21 %    Socioeconomic % Change  -9.3 %    Psych/Spiritual Pre 29.64 %    Psych/Spiritual Post 30 %    Psych/Spiritual % Change 1.21 %    Family Pre 30 %    Family Post 30 %    Family % Change 0 %    GLOBAL Pre 28.32 %    GLOBAL Post 28.83 %    GLOBAL % Change 1.8 %             Personal Goals: Goals established at orientation with interventions provided to work toward goal.  Personal Goals and Risk Factors at Admission - 08/05/21 1411       Core Components/Risk Factors/Patient Goals on Admission    Weight Management Weight Maintenance    Lipids Yes    Intervention Provide education and support for participant on nutrition & aerobic/resistive exercise along with prescribed medications to achieve LDL '70mg'$ , HDL >$Remo'40mg'JdDxP$ .    Expected Outcomes Short Term: Participant states understanding of desired cholesterol values and is compliant with medications prescribed. Participant is following exercise prescription and nutrition guidelines.;Long Term: Cholesterol controlled with medications as prescribed, with individualized exercise RX and with personalized nutrition plan. Value goals: LDL < $Rem'70mg'PVTC$ , HDL > 40 mg.              Personal Goals Discharge:  Goals and Risk Factor Review     Row  Name 08/12/21 3419 09/09/21 1737 10/07/21 1739         Core Components/Risk Factors/Patient Goals Review   Personal Goals Review Weight Management/Obesity;Lipids Weight Management/Obesity;Lipids Weight Management/Obesity;Lipids     Review [name] started cardiac rehab on 08/11/21. Tasmin did well with exercise. Vital signs were stable. Josphine has been doing well with exercise at intensive cardiac rehab and has not  reported any more chest pain or indigestion during exercise since her ED visit. Elica has been doing well with exercise at intensive cardiac rehab. Vital signs and weights have been stable.     Expected Outcomes Emelie will continue to participate inphase 2 cardiac rehab for exercise, nutrition and lifestyel modifications Sussie will continue to participate intensive cardiac rehab for exercise, nutrition and lifestyel modifications Dayana will continue to participate intensive cardiac rehab for exercise, nutrition and lifestyel modifications              Exercise Goals and Review:  Exercise Goals     Row Name 08/05/21 1417             Exercise Goals   Increase Physical Activity Yes       Intervention Provide advice, education, support and counseling about physical activity/exercise needs.;Develop an individualized exercise prescription for aerobic and resistive training based on initial evaluation findings, risk stratification, comorbidities and participant's personal goals.       Expected Outcomes Short Term: Attend rehab on a regular basis to increase amount of physical activity.;Long Term: Add in home exercise to make exercise part of routine and to increase amount of physical activity.;Long Term: Exercising regularly at least 3-5 days a week.       Increase Strength and Stamina Yes       Intervention Provide advice, education, support and counseling about physical activity/exercise needs.;Develop an individualized exercise prescription for aerobic and resistive training based on initial evaluation findings, risk stratification, comorbidities and participant's personal goals.       Expected Outcomes Short Term: Increase workloads from initial exercise prescription for resistance, speed, and METs.;Short Term: Perform resistance training exercises routinely during rehab and add in resistance training at home;Long Term: Improve cardiorespiratory fitness, muscular endurance and strength as  measured by increased METs and functional capacity (6MWT)       Able to understand and use rate of perceived exertion (RPE) scale Yes       Intervention Provide education and explanation on how to use RPE scale       Expected Outcomes Short Term: Able to use RPE daily in rehab to express subjective intensity level;Long Term:  Able to use RPE to guide intensity level when exercising independently       Knowledge and understanding of Target Heart Rate Range (THRR) Yes       Intervention Provide education and explanation of THRR including how the numbers were predicted and where they are located for reference       Expected Outcomes Short Term: Able to state/look up THRR;Short Term: Able to use daily as guideline for intensity in rehab;Long Term: Able to use THRR to govern intensity when exercising independently       Understanding of Exercise Prescription Yes       Intervention Provide education, explanation, and written materials on patient's individual exercise prescription       Expected Outcomes Short Term: Able to explain program exercise prescription;Long Term: Able to explain home exercise prescription to exercise independently  Exercise Goals Re-Evaluation:  Exercise Goals Re-Evaluation     Row Name 08/11/21 1402 09/03/21 1452 10/07/21 0826 10/22/21 1015       Exercise Goal Re-Evaluation   Exercise Goals Review Increase Physical Activity;Increase Strength and Stamina;Able to understand and use rate of perceived exertion (RPE) scale;Knowledge and understanding of Target Heart Rate Range (THRR);Understanding of Exercise Prescription Increase Physical Activity;Increase Strength and Stamina;Able to understand and use rate of perceived exertion (RPE) scale;Knowledge and understanding of Target Heart Rate Range (THRR);Understanding of Exercise Prescription Increase Physical Activity;Increase Strength and Stamina;Able to understand and use rate of perceived exertion (RPE)  scale;Knowledge and understanding of Target Heart Rate Range (THRR);Understanding of Exercise Prescription Increase Physical Activity;Increase Strength and Stamina;Able to understand and use rate of perceived exertion (RPE) scale;Knowledge and understanding of Target Heart Rate Range (THRR);Understanding of Exercise Prescription    Comments Pt's first day in the CRP2 program. Pt understands the exercise RX, RPE scale and THRR. Reviewed METs, goals and home exercise Rx. Pt is not doing any regualr exercise, but works in the yard daily and enjoys dancing. The patient will begin to supplement her exercise here in the [facility]2 program by dancing at home 30 miunutes / day x2 /week. Pt can break up the dancing into 10 or 15 minute time frames. Pt had a goal of increased strength and stamina, and she does feel stonger and voices her balance has improved. Pt still feels tired and takes naps. Even though the fatigue is not gone, it has improved as well. Reviewed METs and goals. Pt continues to improve on her exercise capacity with average METs today of 4.9. Pt continues to fell stonger. Pt graduated form the [facility] program today. Pt made excellent progress and had peak METs of 5.1. Pt plans to continue her exercise at [facility]. Pt acived goal of increased strength and stamia and being able to exercise on consecutive days.    Expected Outcomes Will continue to monitor patient and progress exercise workloads as tolerated. Pt will begin her home exercise by dancing. Will continue to monitor exercise workloads and progress as tolerated. Pt will continue to exercise at gym near her home.             Nutrition & Weight - Outcomes:  Pre Biometrics - 08/05/21 1135       Pre Biometrics   Waist Circumference 36.25 inches    Hip Circumference 39.25 inches    Waist to Hip Ratio 0.92 %    Triceps Skinfold 20 mm    % Body Fat 36.3 %    Grip Strength 25 kg    Flexibility --   Not done   Single Leg Stand 10.56 seconds              Post Biometrics - 10/17/21 0809        Post  Biometrics   Height $Remov'5\' 5"'hqoeVi$  (1.651 m)    Weight 66.2 kg    Waist Circumference 36.25 inches    Hip Circumference 39 inches    Waist to Hip Ratio 0.93 %    BMI (Calculated) 24.29    Triceps Skinfold 17 mm    % Body Fat 35.3 %    Grip Strength 28 kg    Flexibility --   not done   Single Leg Stand 11 seconds             Nutrition:  Nutrition Therapy & Goals - 08/25/21 1024       Nutrition Therapy  Diet Heart Healthy Diet    Drug/Food Interactions Statins/Certain Fruits      Personal Nutrition Goals   Nutrition Goal Patient to choose a variety of fruits, vegetables, lean protein/plant protein, whole grains, and nonfat dairy daily as part of heart healthy diet.    Personal Goal #2 Patient to limit to $Remove'1500mg'iAjqsIf$  of sodium daily    Personal Goal #3 Patient to identify and limit daily intake of saturated fat, trans fat, sodium, and refined carbohydrates.    Comments Goals in progress. Patient reports committment to heart healthy diet; she recently purchased an airfyer. She reports improved acid reflux since stopped drinking with a straw; hx of lymphocytic colitis, GERD, ulcer.      Intervention Plan   Intervention Prescribe, educate and counsel regarding individualized specific dietary modifications aiming towards targeted core components such as weight, hypertension, lipid management, diabetes, heart failure and other comorbidities.    Expected Outcomes Short Term Goal: Understand basic principles of dietary content, such as calories, fat, sodium, cholesterol and nutrients.;Long Term Goal: Adherence to prescribed nutrition plan.             Nutrition Discharge:   Education Questionnaire Score:  Knowledge Questionnaire Score - 10/09/21 0900       Knowledge Questionnaire Score   Post Score 23/24             Goals reviewed with patient; copy given to patient.Pt graduates from  Intensive/Traditional cardiac  rehab program today with completion of   24 exercise and education sessions. Pt maintained good attendance and progressed nicely during their participation in rehab as evidenced by increased MET level.   Medication list reconciled. Repeat  PHQ score-  0.  Pt has made significant lifestyle changes and should be commended for their success. Noemie achieved their goals during cardiac rehab.   Pt plans to continue exercise at [facility] until she has her back surgery in October. Annely increased her distance on her post exercise walk test by 642 feet. We are proud of Suha's progress! Harrell Gave RN BSN

## 2021-10-23 ENCOUNTER — Other Ambulatory Visit: Payer: Self-pay | Admitting: Internal Medicine

## 2021-10-23 ENCOUNTER — Other Ambulatory Visit (HOSPITAL_COMMUNITY): Payer: Self-pay

## 2021-10-23 DIAGNOSIS — Z96611 Presence of right artificial shoulder joint: Secondary | ICD-10-CM | POA: Diagnosis not present

## 2021-10-23 DIAGNOSIS — M159 Polyosteoarthritis, unspecified: Secondary | ICD-10-CM

## 2021-10-23 DIAGNOSIS — M25511 Pain in right shoulder: Secondary | ICD-10-CM | POA: Diagnosis not present

## 2021-10-23 MED ORDER — CELECOXIB 200 MG PO CAPS
200.0000 mg | ORAL_CAPSULE | Freq: Two times a day (BID) | ORAL | 1 refills | Status: DC
  Filled 2021-10-23: qty 180, 90d supply, fill #0

## 2021-10-27 ENCOUNTER — Ambulatory Visit: Payer: Medicare PPO | Admitting: Internal Medicine

## 2021-10-27 ENCOUNTER — Encounter: Payer: Self-pay | Admitting: Internal Medicine

## 2021-10-27 ENCOUNTER — Other Ambulatory Visit (HOSPITAL_COMMUNITY): Payer: Self-pay

## 2021-10-27 VITALS — BP 148/70 | HR 61 | Temp 97.1°F | Resp 16 | Ht 64.5 in | Wt 141.4 lb

## 2021-10-27 DIAGNOSIS — K219 Gastro-esophageal reflux disease without esophagitis: Secondary | ICD-10-CM | POA: Diagnosis not present

## 2021-10-27 DIAGNOSIS — D839 Common variable immunodeficiency, unspecified: Secondary | ICD-10-CM | POA: Diagnosis not present

## 2021-10-27 DIAGNOSIS — J4540 Moderate persistent asthma, uncomplicated: Secondary | ICD-10-CM | POA: Diagnosis not present

## 2021-10-27 DIAGNOSIS — J0100 Acute maxillary sinusitis, unspecified: Secondary | ICD-10-CM

## 2021-10-27 DIAGNOSIS — J479 Bronchiectasis, uncomplicated: Secondary | ICD-10-CM

## 2021-10-27 MED ORDER — LEVOFLOXACIN 500 MG PO TABS
500.0000 mg | ORAL_TABLET | Freq: Every day | ORAL | 0 refills | Status: AC
  Filled 2021-10-27: qty 7, 7d supply, fill #0

## 2021-10-27 NOTE — Patient Instructions (Addendum)
  1.  Continue immunoglobulin infusions every week  -For acute sinus infection: take Levaquin 500 mg daily for 7 days  -take with probiotics and/or live cultured yogurt  2.  Continue to treat reflux / LPR:   A.  Pantoprazole 40 mg in AM-increase to twice daily while on antibiotics   B. Famotidine 40 mg in PM   3.  Treat and prevent inflammation of upper airway:   A.  Montelukast 10 mg -1 tablet 1 time per day  B.  OTC Nasacort -1 spray each nostril 1 time per day  4. If needed:  A. Loratadine 10 mg 1 tablet 1 time per day  B. Ipratropium 0.06% - 2 sprays each nostril every 6 hours (4x/day)  5. Continue routine CVID labs (not needed today)   6.  Continue budesonide therapy for lymphocytic colitis  7.  Keep 6 month follow up with Dr. Neldon Mc   8.  Obtain fall flu vaccine and RSV vaccine

## 2021-10-27 NOTE — Progress Notes (Signed)
FOLLOW UP Date of Service/Encounter:  10/27/21   Subjective:  Beverly [name] (DOB: 10/04/1945) is a 76 y.o. female with PMHx of CVID with bronchiectasis and history of Nocardia pneumonia treated with subcu immunoglobulin infusions, history of asthma, history of reflux, history of rhinitis history of lymphocytic colitis who [facility]21 in re-evaluation of the following: Acute visit for cough and congestion History obtained from: chart review and patient.  For Review, LV was on 10/03/2021 with Dr. Edison Pace seen for acute visit for sinusitis, treated with doxycycline x14 days . Due to indigestion, she was switched to Augmentin 875/125 twice a day on 10/06/2021 x12 days. Symptoms initiated following a cruise to [place].  Reported as a for sinus infection since starting SCIg.  In the past would have sinus infections requiring up to 4 weeks of treatment which were typically responsive to doxycycline.  LABS: 08/12/21: IgG 1,159  Today presents for acute visit follow-up due to ongoing nasal congestion and thick drainage with chest congestion and productive cough. She does feel like she was getting better while taking Augmentin, but by the end of last week, she developed additional drainage and headache.  She is also having coughing and phlegm, the color of her phlegm is getting darker-previously yellow now greenish-brown. She was told to call if her symptoms were not completley resolved by the end of two weeks.  She feels her breathing is great. No wheezing.  She continues to take Mucinex 600 mg twice daily which is helping.  She gets her weekly infusion of SGIg this Wednesday.   She denies fevers.     Allergies as of 10/27/2021       Reactions   Plavix [clopidogrel] Rash   Adhesive [tape] Rash   Codeine Rash        Medication List        Accurate as of October 27, 2021  5:06 PM. If you have any questions, ask your nurse or doctor.           acetaminophen 500 MG tablet Commonly known as: TYLENOL Take 1,000 mg by mouth every 6 (six) hours as needed for moderate pain.   albuterol 108 (90 Base) MCG/ACT inhaler Commonly known as: Ventolin HFA Inhale 2 puffs into the lungs every 6 (six) hours as needed for wheezing or shortness of breath.   aspirin EC 81 MG tablet Take 81 mg by mouth daily.   atorvastatin 40 MG tablet Commonly known as: LIPITOR Take 1 tablet by mouth daily.   Belbuca 750 MCG Film Generic drug: Buprenorphine HCl Place 1 film to inside of mouth every 12 hours   Brilinta 90 MG Tabs tablet Generic drug: ticagrelor Take 1 tablet by mouth 2 times daily. (stop Plavix (clopidogrel))   budesonide 3 MG 24 hr capsule Commonly known as: ENTOCORT EC Take 3 capsules (9 mg total) by mouth daily for 28 days, THEN 2 capsules (6 mg total) daily for 56 days, THEN 1 capsule (3 mg total) daily. Start taking on: August 05, 2021   Calcium Carbonate-Vitamin D 600-200 MG-UNIT Tabs Take 1 tablet by mouth 2 (two) times daily.   celecoxib 200 MG capsule Commonly known as: CELEBREX Take 1 capsule (200 mg total) by mouth 2 (two) times daily.   colestipol 1 g tablet Commonly known as: COLESTID Take 1 tablet by mouth daily.   Culturelle Adult Ult Balance Caps Take 1 capsule by mouth daily.   diclofenac Sodium 1 % Gel Commonly  known as: VOLTAREN Apply 2 g topically 4 (four) times daily as needed (joint pain).   Docusate Sodium 100 MG capsule Take 100 mg by mouth daily.   EPINEPHrine 0.3 mg/0.3 mL Soaj injection Commonly known as: EPI-PEN Inject 0.3 mg into the muscle as needed for anaphylaxis.   famotidine 40 MG tablet Commonly known as: PEPCID Take 1 tablet by mouth at bedtime.   gabapentin 800 MG tablet Commonly known as: NEURONTIN Take 1 tablet by mouth at bedtime.   guaiFENesin 600 MG 12 hr tablet Commonly known as: MUCINEX Take 600 mg by mouth 2 (two) times daily as needed for cough or to loosen  phlegm.   HYDROcodone-acetaminophen 7.5-325 MG tablet Commonly known as: NORCO Take 1 tablet by mouth four times a day as needed for pain   ipratropium 0.03 % nasal spray Commonly known as: ATROVENT Place 1 spray into both nostrils 3 (three) times daily.   levofloxacin 500 MG tablet Commonly known as: Levaquin Take 1 tablet (500 mg total) by mouth daily for 7 days. Started by: Clemon Chambers, MD   levothyroxine 25 MCG tablet Commonly known as: SYNTHROID Take 1 tablet (25 mcg total) by mouth daily.   loratadine 10 MG tablet Commonly known as: Claritin Take 1 tablet (10 mg total) by mouth daily as needed for allergies (Can take an extra dose during flare ups.).   MELATIN PO Take 5 mg by mouth at bedtime.   methocarbamol 500 MG tablet Commonly known as: ROBAXIN Take 1 tablet by mouth three times a day as needed   montelukast 10 MG tablet Commonly known as: SINGULAIR Take 1 tablet (10 mg total) by mouth at bedtime.   multivitamin with minerals tablet Take 1 tablet by mouth daily.   nitroGLYCERIN 0.4 MG SL tablet Commonly known as: Nitrostat Place 1 tablet (0.4 mg total) under the tongue every 5 (five) minutes as needed.   omega-3 acid ethyl esters 1 g capsule Commonly known as: LOVAZA TAKE 2 CAPSULES BY MOUTH 2 TIMES DAILY   ondansetron 4 MG disintegrating tablet Commonly known as: ZOFRAN-ODT Take 1 tablet by mouth every 6 (six) hours as needed for nausea or vomiting.   pantoprazole 40 MG tablet Commonly known as: PROTONIX Take 1 tablet (40 mg total) by mouth daily with breakfast.   PARoxetine 30 MG tablet Commonly known as: PAXIL Take 1 tablet by mouth once a day.   [place] 4 % Crea Generic drug: Fluorouracil Apply to aa total 28 applications & sun protect   triamcinolone 55 MCG/ACT Aero nasal inhaler Commonly known as: NASACORT Place 2 sprays into each nostril once daily.   Vitamin D (Ergocalciferol) 1.25 MG (50000 UNIT) Caps capsule Commonly known as:  DRISDOL Take 1 capsule (50,000 Units total) by mouth every 7 (seven) days for 12 doses.   Xembify 10 GM/50ML Soln Generic drug: Immune Globulin (Human)-klhw Inject 10 g into the skin once a week.       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   right sholder   BCC (basal cell carcinoma of skin) 05/09/2014   mid lower back   BCC (basal cell carcinoma of skin) 05/03/2017   right low back  BCC (basal cell carcinoma of skin) 05/03/2017   left upper back   BCC (basal cell carcinoma of skin) 07/05/2018   left mid back   BCC (basal cell carcinoma of skin) 05/20/1992   upper back   BCC (basal cell carcinoma of skin) 07/29/1993   left sholder medial   BCC (basal cell carcinoma of skin) 07/29/1993   left sholder lateral   BCC (basal cell carcinoma of skin) 07/29/1993   right thigh   BCC (basal cell carcinoma of skin) 07/29/1993   right sholder   BCC (basal cell carcinoma of skin) 12/22/1994   right mid forearm   BCC (basal cell carcinoma of skin) 12/22/1994   right upper forearm   BCC (basal cell carcinoma of skin) 12/22/1994   lower right upper arm   BCC (basal cell carcinoma of skin) 12/22/1994   right upper arm sholder   BCC (basal cell carcinoma of skin) 08/11/1995   left leg below knee   BCC (basal cell carcinoma of skin) 04/11/2002   mid back   BCC (basal cell carcinoma of skin) 12/10/2002   right center upper back   BCC (basal cell carcinoma of skin) 05/26/2005   right post sholder   BCC (basal cell carcinoma) 05/09/2014   left inner shin   BCC (basal cell carcinoma) 06/12/2014   left forearm   Bowen's disease 10/07/1994   right post knee, right inner forearm/wrist   Bowen's disease 08/11/1995   left sholder    Bronchiectasis ([place])    Cataract    left   Chronic pain    Common variable immunodeficiency [facility])    sees dr Gayleen Orem 02-11-2021 epic   Coronary artery disease    Depression    hx of   Diverticulosis of colon 1998   mild   DJD (degenerative joint disease)    Duodenal ulcer    h/o yrs ago   GERD (gastroesophageal reflux disease)    History of SCC (squamous cell carcinoma) of skin    Dr. Denna Haggard   History of sinus bradycardia    HLD (hyperlipidemia)    hypertriglyceridemia   Hypertensive retinopathy of both eyes 11/18/2017   Hypothyroidism    IBS (irritable bowel syndrome)    Internal hemorrhoids 1998   Lymphocytic colitis    Mitral regurgitation    mild   Nocardiosis    relased by infection disease dec 2022   Osteoarthritis    feet,shoulder,neck,back,hips and hands.   Pneumonia 2015   PONV (postoperative nausea and vomiting)    Rotator cuff tear, right 02/2019   infraspinatus and supraspinatus, and dislocation of long head of bicep tendon (Dr. Alvan Dame)   SCC (squamous cell carcinoma) 11/26/2014   left hand, right hand, right deltoid mnay areas   SCC (squamous cell carcinoma) 05/03/2017   left cheek   Sleep apnea    uses a mouth guard nightly   Squamous cell carcinoma in situ (SCCIS) 07/05/2018   left hand   Tracheobronchomalacia    Vitamin D deficiency    mild   Past Surgical History:  Procedure Laterality Date   ABDOMINAL HYSTERECTOMY  1998   complete   BLEPHAROPLASTY Bilateral 01/2018   BROW LIFT Bilateral 10/10/2018   Procedure: BROW LIFT;  Surgeon: Wallace Going, DO;  Location: [place];  Service: Plastics;  Laterality: Bilateral;   BUNIONECTOMY     R 12/08, L 2004 (Dr. Janus Molder)   [place]  bilateral   CARPAL TUNNEL RELEASE Left 03/06/2021   Procedure: Left Revision Carpal Tunnel Release with hypothenar fat pad flap;  Surgeon: [name], MD;  Location[facility];   Service: Orthopedics;  Laterality: Left;  with local anesthesia   CATARACT EXTRACTION Bilateral Left in 08/2012, Right 04/2016   Dr.Hecker   CESAREAN SECTION     1981 and [place]   laparoscopic   CLOSED REDUCTION NASAL FRACTURE N/A 08/29/2018   Procedure: CLOSED REDUCTION NASAL FRACTURE;  Surgeon: Wallace Going, DO;  Location: [place];  Service: Plastics;  Laterality: N/A;  1 hour, please   COLONOSCOPY  2006   COLONOSCOPY  01/2012   due again 01/2021; mild diverticulosis   CORONARY STENT INTERVENTION N/A 05/19/2021   Procedure: CORONARY STENT INTERVENTION;  Surgeon: Nelva Bush, MD;  Location: [place] [facility];  Service: Cardiovascular;  Laterality: N/A;   epidural steroid injection, back  02/2010   HIP SURGERY     right bursectomy x 3   HIP SURGERY Right 2000   torn cartilage, repaired   INGUINAL HERNIA REPAIR  10/2007   bilat   INTRAVASCULAR ULTRASOUND/IVUS N/A 05/19/2021   Procedure: Intravascular Ultrasound/IVUS;  Surgeon: Nelva Bush, MD;  Location: [place] [facility];  Service: Cardiovascular;  Laterality: N/A;   LEFT HEART CATH AND CORONARY ANGIOGRAPHY N/A 05/19/2021   Procedure: LEFT HEART CATH AND CORONARY ANGIOGRAPHY;  Surgeon: Nelva Bush, MD;  Location: [place] [facility];  Service: Cardiovascular;  Laterality: N/A;   NECK SURGERY  1989   c6-7 cervical laminectomy and diskecotmy   REVERSE SHOULDER ARTHROPLASTY Right 04/11/2019   Procedure: REVERSE SHOULDER ARTHROPLASTY;  Surgeon: Nicholes Stairs, MD;  Location: [place];  Service: Orthopedics;  Laterality: Right;  [place] Block   SHOULDER SURGERY Left 03/2003   left rotator cuff repair   SHOULDER SURGERY Left 03/14/2018   rotator cuff repair; Dr. Tonita Cong   TONSILLECTOMY  age 73   TROCHANTERIC [place] EXTRACTION     as teenager   Otherwise, there have been no changes to her past medical history,  surgical history, family history, or social history.  ROS: All others negative except as noted per HPI.   Objective:  BP (!) 148/70   Pulse 61   Temp (!) 97.1 F (36.2 C)   Resp 16   Ht 5' 4.5" (1.638 m)   Wt 141 lb 6.4 oz (64.1 kg)   SpO2 99%   BMI 23.90 kg/m  Body mass index is 23.9 kg/m. Physical Exam: General Appearance:  Alert, cooperative, no distress, appears stated age, well-appearing  Head:  Normocephalic, without obvious abnormality, atraumatic  Eyes:  Conjunctiva clear, EOM's intact  Nose: Nares normal, normal mucosa and no visible anterior polyps  Throat: Lips, tongue normal; teeth and gums normal, normal posterior oropharynx and + cobblestoning  Neck: Supple, symmetrical  Lungs:   clear to auscultation bilaterally, Respirations unlabored, no coughing  Heart:  regular rate and rhythm and no murmur, Appears well perfused  Extremities: No edema  Skin: Skin color, texture, turgor normal, no rashes or lesions on visualized portions of skin  Neurologic: No gross deficits   Assessment/Plan  Tacy continues to have sinus drainage and mucus production despite a 2-week course of Augmentin.  Given her history, discussed switching to a course of Levaquin for partially treated sinusitis  She is well-appearing today, afebrile, with normal  lung sounds. Do not suspect bacterial pneumonia at this time.  1.  Continue immunoglobulin infusions every week  -For acute sinus infection-failed course of Augmentin: take Levaquin 500 mg daily for 7 days  -take with probiotics and/or live cultured yogurt  2.  Continue to treat reflux / LPR:   A.  Pantoprazole 40 mg in AM-increase to twice daily while on antibiotics   B. Famotidine 40 mg in PM   3.  Treat and prevent inflammation of upper airway:   A.  Montelukast 10 mg -1 tablet 1 time per day  B.  OTC Nasacort -1 spray each nostril 1 time per day  4. If needed:  A. Loratadine 10 mg 1 tablet 1 time per day  B. Ipratropium 0.06% -  2 sprays each nostril every 6 hours (4x/day)  5. Continue routine CVID labs (not needed today)   6.  Continue budesonide therapy for lymphocytic colitis  7.  Keep 6 month follow up with Dr. Neldon Mc   8.  Obtain fall flu vaccine and RSV vaccine  Sigurd Sos, MD  [facility]

## 2021-10-27 NOTE — Telephone Encounter (Signed)
Hello,  I do recommend coming in for a visit and exam if that is possible.  I am sorry you are not feeling well?  Hopefully we can get you in for an acute visit with one of our providers this week.  Thanks!

## 2021-10-28 ENCOUNTER — Other Ambulatory Visit (HOSPITAL_COMMUNITY): Payer: Self-pay

## 2021-10-29 ENCOUNTER — Other Ambulatory Visit (HOSPITAL_COMMUNITY): Payer: Self-pay

## 2021-10-29 MED ORDER — GABAPENTIN 800 MG PO TABS
ORAL_TABLET | ORAL | 2 refills | Status: DC
  Filled 2021-10-29: qty 90, 90d supply, fill #0
  Filled 2022-01-26: qty 90, 90d supply, fill #1
  Filled 2022-04-21: qty 90, 90d supply, fill #2

## 2021-10-30 ENCOUNTER — Telehealth: Payer: Self-pay | Admitting: Internal Medicine

## 2021-10-30 NOTE — Telephone Encounter (Signed)
Patient called to request a copy of a letter that was sent to [facility], she believes in 2021.  She would like a copy of the letter regarding compression vests if possible.  Please call patient to discuss at [phone]

## 2021-11-02 ENCOUNTER — Other Ambulatory Visit (HOSPITAL_COMMUNITY): Payer: Self-pay

## 2021-11-03 ENCOUNTER — Other Ambulatory Visit: Payer: Self-pay

## 2021-11-03 ENCOUNTER — Inpatient Hospital Stay (HOSPITAL_COMMUNITY)
Admission: EM | Admit: 2021-11-03 | Discharge: 2021-11-06 | DRG: 378 | Disposition: A | Discharge status: Home / Self Care | Payer: Medicare PPO | Attending: Internal Medicine | Admitting: Internal Medicine

## 2021-11-03 ENCOUNTER — Encounter: Payer: Self-pay | Admitting: Gastroenterology

## 2021-11-03 ENCOUNTER — Encounter: Payer: Self-pay | Admitting: Cardiovascular Disease

## 2021-11-03 ENCOUNTER — Other Ambulatory Visit (HOSPITAL_COMMUNITY): Payer: Self-pay

## 2021-11-03 ENCOUNTER — Emergency Department (HOSPITAL_COMMUNITY): Payer: Medicare PPO

## 2021-11-03 DIAGNOSIS — Z86007 Personal history of in-situ neoplasm of skin: Secondary | ICD-10-CM

## 2021-11-03 DIAGNOSIS — E781 Pure hyperglyceridemia: Secondary | ICD-10-CM | POA: Diagnosis present

## 2021-11-03 DIAGNOSIS — Z825 Family history of asthma and other chronic lower respiratory diseases: Secondary | ICD-10-CM

## 2021-11-03 DIAGNOSIS — D649 Anemia, unspecified: Secondary | ICD-10-CM | POA: Diagnosis not present

## 2021-11-03 DIAGNOSIS — K922 Gastrointestinal hemorrhage, unspecified: Secondary | ICD-10-CM | POA: Diagnosis present

## 2021-11-03 DIAGNOSIS — D839 Common variable immunodeficiency, unspecified: Secondary | ICD-10-CM | POA: Diagnosis present

## 2021-11-03 DIAGNOSIS — K319 Disease of stomach and duodenum, unspecified: Secondary | ICD-10-CM | POA: Diagnosis not present

## 2021-11-03 DIAGNOSIS — D696 Thrombocytopenia, unspecified: Secondary | ICD-10-CM | POA: Diagnosis present

## 2021-11-03 DIAGNOSIS — Z800 Family history of malignant neoplasm of digestive organs: Secondary | ICD-10-CM

## 2021-11-03 DIAGNOSIS — J45909 Unspecified asthma, uncomplicated: Secondary | ICD-10-CM | POA: Diagnosis present

## 2021-11-03 DIAGNOSIS — Z85828 Personal history of other malignant neoplasm of skin: Secondary | ICD-10-CM

## 2021-11-03 DIAGNOSIS — G8929 Other chronic pain: Secondary | ICD-10-CM | POA: Diagnosis present

## 2021-11-03 DIAGNOSIS — I10 Essential (primary) hypertension: Secondary | ICD-10-CM | POA: Diagnosis present

## 2021-11-03 DIAGNOSIS — J398 Other specified diseases of upper respiratory tract: Secondary | ICD-10-CM

## 2021-11-03 DIAGNOSIS — M549 Dorsalgia, unspecified: Secondary | ICD-10-CM | POA: Diagnosis not present

## 2021-11-03 DIAGNOSIS — E785 Hyperlipidemia, unspecified: Secondary | ICD-10-CM | POA: Diagnosis present

## 2021-11-03 DIAGNOSIS — I959 Hypotension, unspecified: Secondary | ICD-10-CM | POA: Diagnosis not present

## 2021-11-03 DIAGNOSIS — K5750 Diverticulosis of both small and large intestine without perforation or abscess without bleeding: Secondary | ICD-10-CM | POA: Diagnosis present

## 2021-11-03 DIAGNOSIS — K2100 Gastro-esophageal reflux disease with esophagitis, without bleeding: Secondary | ICD-10-CM | POA: Diagnosis not present

## 2021-11-03 DIAGNOSIS — Z955 Presence of coronary angioplasty implant and graft: Secondary | ICD-10-CM

## 2021-11-03 DIAGNOSIS — Z792 Long term (current) use of antibiotics: Secondary | ICD-10-CM

## 2021-11-03 DIAGNOSIS — Z79890 Hormone replacement therapy (postmenopausal): Secondary | ICD-10-CM

## 2021-11-03 DIAGNOSIS — D801 Nonfamilial hypogammaglobulinemia: Secondary | ICD-10-CM | POA: Diagnosis present

## 2021-11-03 DIAGNOSIS — D62 Acute posthemorrhagic anemia: Secondary | ICD-10-CM | POA: Diagnosis present

## 2021-11-03 DIAGNOSIS — J849 Interstitial pulmonary disease, unspecified: Secondary | ICD-10-CM | POA: Diagnosis present

## 2021-11-03 DIAGNOSIS — E039 Hypothyroidism, unspecified: Secondary | ICD-10-CM | POA: Diagnosis present

## 2021-11-03 DIAGNOSIS — I2510 Atherosclerotic heart disease of native coronary artery without angina pectoris: Secondary | ICD-10-CM | POA: Diagnosis present

## 2021-11-03 DIAGNOSIS — K250 Acute gastric ulcer with hemorrhage: Secondary | ICD-10-CM

## 2021-11-03 DIAGNOSIS — R1111 Vomiting without nausea: Secondary | ICD-10-CM | POA: Diagnosis not present

## 2021-11-03 DIAGNOSIS — Z7982 Long term (current) use of aspirin: Secondary | ICD-10-CM

## 2021-11-03 DIAGNOSIS — Z96611 Presence of right artificial shoulder joint: Secondary | ICD-10-CM | POA: Diagnosis present

## 2021-11-03 DIAGNOSIS — Z7902 Long term (current) use of antithrombotics/antiplatelets: Secondary | ICD-10-CM

## 2021-11-03 DIAGNOSIS — Z818 Family history of other mental and behavioral disorders: Secondary | ICD-10-CM

## 2021-11-03 DIAGNOSIS — K5710 Diverticulosis of small intestine without perforation or abscess without bleeding: Secondary | ICD-10-CM | POA: Diagnosis not present

## 2021-11-03 DIAGNOSIS — K259 Gastric ulcer, unspecified as acute or chronic, without hemorrhage or perforation: Secondary | ICD-10-CM | POA: Diagnosis not present

## 2021-11-03 DIAGNOSIS — R739 Hyperglycemia, unspecified: Secondary | ICD-10-CM | POA: Diagnosis present

## 2021-11-03 DIAGNOSIS — K920 Hematemesis: Principal | ICD-10-CM

## 2021-11-03 DIAGNOSIS — Z981 Arthrodesis status: Secondary | ICD-10-CM | POA: Diagnosis not present

## 2021-11-03 DIAGNOSIS — K6389 Other specified diseases of intestine: Secondary | ICD-10-CM | POA: Diagnosis not present

## 2021-11-03 DIAGNOSIS — R0902 Hypoxemia: Secondary | ICD-10-CM | POA: Diagnosis not present

## 2021-11-03 DIAGNOSIS — R1013 Epigastric pain: Secondary | ICD-10-CM

## 2021-11-03 DIAGNOSIS — E559 Vitamin D deficiency, unspecified: Secondary | ICD-10-CM | POA: Diagnosis present

## 2021-11-03 DIAGNOSIS — G4730 Sleep apnea, unspecified: Secondary | ICD-10-CM | POA: Diagnosis present

## 2021-11-03 DIAGNOSIS — K254 Chronic or unspecified gastric ulcer with hemorrhage: Principal | ICD-10-CM | POA: Diagnosis present

## 2021-11-03 DIAGNOSIS — Z79899 Other long term (current) drug therapy: Secondary | ICD-10-CM | POA: Diagnosis not present

## 2021-11-03 DIAGNOSIS — K52832 Lymphocytic colitis: Secondary | ICD-10-CM | POA: Diagnosis present

## 2021-11-03 DIAGNOSIS — E872 Acidosis, unspecified: Secondary | ICD-10-CM | POA: Diagnosis present

## 2021-11-03 DIAGNOSIS — E876 Hypokalemia: Secondary | ICD-10-CM | POA: Diagnosis present

## 2021-11-03 DIAGNOSIS — Z8711 Personal history of peptic ulcer disease: Secondary | ICD-10-CM

## 2021-11-03 DIAGNOSIS — K921 Melena: Secondary | ICD-10-CM | POA: Diagnosis present

## 2021-11-03 DIAGNOSIS — Z90710 Acquired absence of both cervix and uterus: Secondary | ICD-10-CM

## 2021-11-03 DIAGNOSIS — Z8249 Family history of ischemic heart disease and other diseases of the circulatory system: Secondary | ICD-10-CM

## 2021-11-03 DIAGNOSIS — Z9049 Acquired absence of other specified parts of digestive tract: Secondary | ICD-10-CM

## 2021-11-03 DIAGNOSIS — Z833 Family history of diabetes mellitus: Secondary | ICD-10-CM

## 2021-11-03 DIAGNOSIS — K760 Fatty (change of) liver, not elsewhere classified: Secondary | ICD-10-CM | POA: Diagnosis not present

## 2021-11-03 DIAGNOSIS — R58 Hemorrhage, not elsewhere classified: Secondary | ICD-10-CM | POA: Diagnosis not present

## 2021-11-03 DIAGNOSIS — J449 Chronic obstructive pulmonary disease, unspecified: Secondary | ICD-10-CM | POA: Diagnosis not present

## 2021-11-03 LAB — CBC WITH DIFFERENTIAL/PLATELET
Abs Immature Granulocytes: 0.2 10*3/uL — ABNORMAL HIGH (ref 0.00–0.07)
Basophils Absolute: 0 10*3/uL (ref 0.0–0.1)
Basophils Relative: 0 %
Eosinophils Absolute: 0 10*3/uL (ref 0.0–0.5)
Eosinophils Relative: 0 %
HCT: 30 % — ABNORMAL LOW (ref 36.0–46.0)
Hemoglobin: 9.7 g/dL — ABNORMAL LOW (ref 12.0–15.0)
Immature Granulocytes: 1 %
Lymphocytes Relative: 9 %
Lymphs Abs: 1.3 10*3/uL (ref 0.7–4.0)
MCH: 33 pg (ref 26.0–34.0)
MCHC: 32.3 g/dL (ref 30.0–36.0)
MCV: 102 fL — ABNORMAL HIGH (ref 80.0–100.0)
Monocytes Absolute: 0.9 10*3/uL (ref 0.1–1.0)
Monocytes Relative: 6 %
Neutro Abs: 12 10*3/uL — ABNORMAL HIGH (ref 1.7–7.7)
Neutrophils Relative %: 84 %
Platelets: 188 10*3/uL (ref 150–400)
RBC: 2.94 MIL/uL — ABNORMAL LOW (ref 3.87–5.11)
RDW: 14.9 % (ref 11.5–15.5)
WBC: 14.5 10*3/uL — ABNORMAL HIGH (ref 4.0–10.5)
nRBC: 0 % (ref 0.0–0.2)

## 2021-11-03 LAB — LACTIC ACID, PLASMA: Lactic Acid, Venous: 2.3 mmol/L (ref 0.5–1.9)

## 2021-11-03 LAB — BASIC METABOLIC PANEL
Anion gap: 10 (ref 5–15)
BUN: 37 mg/dL — ABNORMAL HIGH (ref 8–23)
CO2: 24 mmol/L (ref 22–32)
Calcium: 9.1 mg/dL (ref 8.9–10.3)
Chloride: 107 mmol/L (ref 98–111)
Creatinine, Ser: 0.62 mg/dL (ref 0.44–1.00)
GFR, Estimated: >60 mL/min (ref >60)
Glucose, Bld: 204 mg/dL — ABNORMAL HIGH (ref 70–99)
Potassium: 3.8 mmol/L (ref 3.5–5.1)
Sodium: 141 mmol/L (ref 135–145)

## 2021-11-03 LAB — POC OCCULT BLOOD, ED: Fecal Occult Bld: POSITIVE — AB

## 2021-11-03 MED ORDER — PANTOPRAZOLE SODIUM 40 MG IV SOLR
40.0000 mg | Freq: Once | INTRAVENOUS | Status: AC
  Administered 2021-11-03: 40 mg via INTRAVENOUS
  Filled 2021-11-03: qty 10

## 2021-11-03 MED ORDER — SODIUM CHLORIDE 0.9 % IV BOLUS
500.0000 mL | Freq: Once | INTRAVENOUS | Status: AC
  Administered 2021-11-03: 500 mL via INTRAVENOUS

## 2021-11-03 MED ORDER — IOHEXOL 350 MG/ML SOLN
75.0000 mL | Freq: Once | INTRAVENOUS | Status: AC | PRN
  Administered 2021-11-03: 75 mL via INTRAVENOUS

## 2021-11-03 MED ORDER — ONDANSETRON HCL 4 MG/2ML IJ SOLN
4.0000 mg | Freq: Once | INTRAMUSCULAR | Status: AC
  Administered 2021-11-03: 4 mg via INTRAVENOUS
  Filled 2021-11-03: qty 2

## 2021-11-03 NOTE — ED Provider Notes (Signed)
[facility] EMERGENCY DEPARTMENT Provider Note   CSN: [id number] Arrival date & time: 11/03/21  1855     History  Chief Complaint  Patient presents with   GI Bleeding    Beverly [name] is a 76 y.o. female.  Beverly [name] is a 23F with PMH significant for CVID with bronchiectasis and lymphocytic colitis, 05/2021 cath with DES LAD and DES ramus on Brilinta/aspirin who presents via EMS following an episode of hematemesis and hematochezia earlier this evening. She reports two loose bowel movements prior to the onset of simultaneous hematemesis (patient reports "coffee-ground" and hematochezia (clots) earlier this evening. She reports crawling from the toilet in order to call 911 and reports thirst and generalized weakness. She denies chest pain, abdominal pain, nausea. She reports feeling slightly better and has not had additional hematemesis/hematochezia. She notes that she has sinus bradycardia at baseline and feels like her heart is beating fast. She reports she has taken Brilinta/aspirin as prescribed following her DES placement in 05/2021. She also takes Celebrex on a chronic basis for longstanding severe osteoarthritis. She also has a history of lymphocytic colitis (diagnosed 2022) related to her CVID that she receives weekly immunoglobulin injections for (most recently last Wednesday).         Home Medications Prior to Admission medications   Medication Sig Start Date End Date Taking? Authorizing Provider  acetaminophen (TYLENOL) 500 MG tablet Take 1,000 mg by mouth every 6 (six) hours as needed for moderate pain.     [provider]  albuterol (VENTOLIN HFA) 108 (90 Base) MCG/ACT inhaler Inhale 2 puffs into the lungs every 6 (six) hours as needed for wheezing or shortness of breath. 08/12/21   Kozlow, Donnamarie Poag, MD  aspirin EC 81 MG tablet Take 81 mg by mouth daily.    [provider]  atorvastatin (LIPITOR) 40 MG tablet Take 1 tablet by mouth daily.  07/23/21 07/23/22  Isaac Bliss, Rayford Halsted, MD  budesonide (ENTOCORT EC) 3 MG 24 hr capsule Take 3 capsules (9 mg total) by mouth daily for 28 days, THEN 2 capsules (6 mg total) daily for 56 days, THEN 1 capsule (3 mg total) daily. 08/05/21 12/30/21  Cirigliano, Dominic Pea, DO  Buprenorphine HCl (BELBUCA) 750 MCG FILM Place 1 film to inside of mouth every 12 hours 09/12/21     Calcium Carbonate-Vitamin D 600-200 MG-UNIT TABS Take 1 tablet by mouth 2 (two) times daily.    [provider]  celecoxib (CELEBREX) 200 MG capsule Take 1 capsule (200 mg total) by mouth 2 (two) times daily. 10/23/21   Isaac Bliss, Rayford Halsted, MD  colestipol (COLESTID) 1 g tablet Take 1 tablet by mouth daily. 10/21/21   Kozlow, Donnamarie Poag, MD  diclofenac Sodium (VOLTAREN) 1 % GEL Apply 2 g topically 4 (four) times daily as needed (joint pain). 12/29/18   [provider]  Docusate Sodium 100 MG capsule Take 100 mg by mouth daily. 09/27/20   [provider]  EPINEPHrine 0.3 mg/0.3 mL IJ SOAJ injection Inject 0.3 mg into the muscle as needed for anaphylaxis. 01/21/21   [provider]  famotidine (PEPCID) 40 MG tablet Take 1 tablet by mouth at bedtime. 08/12/21   Kozlow, Donnamarie Poag, MD  Fluorouracil (TOLAK) 4 % CREA Apply to aa total 28 applications & sun protect 04/07/21   Lavonna Monarch, MD  gabapentin (NEURONTIN) 800 MG tablet Take 1 tablet by mouth at bedtime. 10/29/21     guaiFENesin (MUCINEX) 600 MG 12  hr tablet Take 600 mg by mouth 2 (two) times daily as needed for cough or to loosen phlegm.    [provider]  HYDROcodone-acetaminophen (NORCO) 7.5-325 MG tablet Take 1 tablet by mouth four times a day as needed for pain 06/20/21     ipratropium (ATROVENT) 0.03 % nasal spray Place 1 spray into both nostrils 3 (three) times daily. 08/12/21   Kozlow, Donnamarie Poag, MD  Lactobacillus-Inulin (CULTURELLE ADULT ULT BALANCE) CAPS Take 1 capsule by mouth daily.    [provider]  levofloxacin (LEVAQUIN) 500  MG tablet Take 1 tablet (500 mg total) by mouth daily for 7 days. 10/27/21 11/03/21  Clemon Chambers, MD  levothyroxine (SYNTHROID) 25 MCG tablet Take 1 tablet (25 mcg total) by mouth daily. 05/08/21   Isaac Bliss, Rayford Halsted, MD  loratadine (CLARITIN) 10 MG tablet Take 1 tablet (10 mg total) by mouth daily as needed for allergies (Can take an extra dose during flare ups.). 08/12/21   Kozlow, Donnamarie Poag, MD  Melatonin-Pyridoxine (MELATIN PO) Take 5 mg by mouth at bedtime. 02/13/20   [provider]  methocarbamol (ROBAXIN) 500 MG tablet Take 1 tablet by mouth three times a day as needed 06/20/21     montelukast (SINGULAIR) 10 MG tablet Take 1 tablet (10 mg total) by mouth at bedtime. 08/12/21   Kozlow, Donnamarie Poag, MD  Multiple Vitamins-Minerals (MULTIVITAMIN WITH MINERALS) tablet Take 1 tablet by mouth daily.    [provider]  nitroGLYCERIN (NITROSTAT) 0.4 MG SL tablet Place 1 tablet (0.4 mg total) under the tongue every 5 (five) minutes as needed. 05/20/21   Cheryln Manly, NP  omega-3 acid ethyl esters (LOVAZA) 1 g capsule TAKE 2 CAPSULES BY MOUTH 2 TIMES DAILY 10/15/21 10/15/22  Isaac Bliss, Rayford Halsted, MD  ondansetron (ZOFRAN-ODT) 4 MG disintegrating tablet Take 1 tablet by mouth every 6 (six) hours as needed for nausea or vomiting. 03/25/21   Cirigliano, Vito V, DO  pantoprazole (PROTONIX) 40 MG tablet Take 1 tablet (40 mg total) by mouth daily with breakfast. 08/07/21   Josue [name], MD  PARoxetine (PAXIL) 30 MG tablet Take 1 tablet by mouth once a day. 06/12/21     ticagrelor (BRILINTA) 90 MG TABS tablet Take 1 tablet by mouth 2 times daily. (stop Plavix (clopidogrel)) 06/16/21   Imogene Burn, PA-C  triamcinolone (NASACORT) 55 MCG/ACT AERO nasal inhaler Place 2 sprays into each nostril once daily. 10/10/21   Kozlow, Donnamarie Poag, MD  Vitamin D, Ergocalciferol, (DRISDOL) 1.25 MG (50000 UNIT) CAPS capsule Take 1 capsule (50,000 Units total) by mouth every 7 (seven) days for 12 doses. 08/12/21  11/04/21  Erline Hau, MD  XEMBIFY 10 GM/50ML SOLN Inject 10 g into the skin once a week. 07/21/21   [provider]  potassium chloride (KLOR-CON) 10 MEQ tablet Take 2 tablets (total 20 MEQ) by mouth once 03/29/20 04/16/20  Rosiland Oz, MD      Allergies    Plavix [clopidogrel], Adhesive [tape], and Codeine    Review of Systems   Review of Systems  Constitutional:  Negative for chills and fever.  HENT:  Negative for congestion.   Eyes:  Negative for visual disturbance.  Respiratory:  Negative for shortness of breath.   Cardiovascular:  Negative for chest pain.  Gastrointestinal:  Positive for abdominal pain, blood in stool, nausea and vomiting.  Genitourinary:  Negative for dysuria and flank pain.  Musculoskeletal:  Negative for back pain, neck pain and  neck stiffness.  Skin:  Negative for rash.  Neurological:  Negative for light-headedness and headaches.    Physical Exam Updated Vital Signs BP (!) 118/44   Pulse 69   Temp (!) 97.5 F (36.4 C) (Oral)   Resp 15   SpO2 98%  Physical Exam Vitals and nursing note reviewed.  Constitutional:      General: She is not in acute distress.    Appearance: She is well-developed.  HENT:     Head: Normocephalic and atraumatic.     Mouth/Throat:     Mouth: Mucous membranes are moist.  Eyes:     General:        Right eye: No discharge.        Left eye: No discharge.     Conjunctiva/sclera: Conjunctivae normal.  Neck:     Trachea: No tracheal deviation.  Cardiovascular:     Rate and Rhythm: Normal rate and regular rhythm.  Pulmonary:     Effort: Pulmonary effort is normal.     Breath sounds: Normal breath sounds.  Abdominal:     General: There is no distension.     Palpations: Abdomen is soft.     Tenderness: There is no abdominal tenderness. There is no guarding.  Genitourinary:    Comments: Dark blood rectal exam Musculoskeletal:        General: No swelling.     Cervical back: Normal range of motion  and neck supple. No rigidity.  Skin:    General: Skin is warm.     Capillary Refill: Capillary refill takes less than 2 seconds.     Coloration: Skin is pale.     Findings: No rash.  Neurological:     General: No focal deficit present.     Mental Status: She is alert.     Cranial Nerves: No cranial nerve deficit.  Psychiatric:        Mood and Affect: Mood normal.     ED Results / Procedures / Treatments   Labs (all labs ordered are listed, but only abnormal results are displayed) Labs Reviewed  BASIC METABOLIC PANEL - Abnormal; Notable for the following components:      Result Value   Glucose, Bld 204 (*)    BUN 37 (*)    All other components within normal limits  CBC WITH DIFFERENTIAL/PLATELET - Abnormal; Notable for the following components:   WBC 14.5 (*)    RBC 2.94 (*)    Hemoglobin 9.7 (*)    HCT 30.0 (*)    MCV 102.0 (*)    Neutro Abs 12.0 (*)    Abs Immature Granulocytes 0.20 (*)    All other components within normal limits  LACTIC ACID, PLASMA - Abnormal; Notable for the following components:   Lactic Acid, Venous 2.3 (*)    All other components within normal limits  POC OCCULT BLOOD, ED - Abnormal; Notable for the following components:   Fecal Occult Bld POSITIVE (*)    All other components within normal limits  TYPE AND SCREEN  ABO/RH    EKG None  Radiology No results found.  Procedures .Critical Care  Performed by: Elnora Morrison, MD Authorized by: Elnora Morrison, MD   Critical care provider statement:    Critical care time (minutes):  30   Critical care start time:  11/03/2021 9:00 PM   Critical care end time:  11/03/2021 9:30 PM   Critical care time was exclusive of:  Separately billable procedures and treating other patients and teaching  time   Critical care was time spent personally by me on the following activities:  Development of treatment plan with patient or surrogate, discussions with consultants, evaluation of patient's response to  treatment, examination of patient, ordering and review of laboratory studies, ordering and review of radiographic studies, ordering and performing treatments and interventions, pulse oximetry, re-evaluation of patient's condition and review of old charts     Medications Ordered in ED Medications  sodium chloride 0.9 % bolus 500 mL (500 mLs Intravenous New Bag/Given 11/03/21 2118)  pantoprazole (PROTONIX) injection 40 mg (40 mg Intravenous Given 11/03/21 2123)  ondansetron (ZOFRAN) injection 4 mg (4 mg Intravenous Given 11/03/21 2123)    ED Course/ Medical Decision Making/ A&P                           Medical Decision Making Amount and/or Complexity of Data Reviewed Labs: ordered.  Risk Prescription drug management.   Patient presents with recurrent hematemesis and rectal bleeding primary concern for ulcer secondary to multiple medication and history of reflux.  Other differentials include Mallory-Weiss tear, colitis, ischemic bowel, other.  Plan: Diagnostics: lactate, CBC, BMP, PT/PTT HgB 9.7 from 12.6 08/2021. Macrocytic possibly related to colitis? Treatment: - IV PPI - admit for EGD -  hold celebrex as pt on Brilinta/aspirin/celebrex - f/u cards for timing of resumption given recent DES placement.  Lactic acid mild elevated plan to repeat after fluids.  Type and screen sent, will monitor hemoglobin and vital signs.  Reviewed medical records and patient has seen [facility], page them.  Plan to admit for hospitalist for further care, arrange EGD. PPI.  Spoke with Dr. Carlton [facility], recommended if persistent pain for CT scan for further delineation and they will see the patient in the morning.  Updated patient on plan of care.  Patient improved with Zofran IV fluids given.  Type and screen sent.  Patient care be signed out to follow-up CT scan results and admit to the hospitalist.        Final Clinical Impression(s) / ED Diagnoses Final diagnoses:  Hematemesis  with nausea  Symptomatic anemia  Abdominal pain, epigastric  Acute blood loss anemia    Rx / DC Orders ED Discharge Orders     None         Elnora Morrison, MD 11/03/21 2215

## 2021-11-03 NOTE — Telephone Encounter (Signed)
Called patient and advised her that I looked in her chart and was unable to located the letter that was sent back in 2021 to [facility]. Patient voiced understanding. Nothing further needed

## 2021-11-03 NOTE — ED Triage Notes (Addendum)
Pt  bib [facility] from home c/o rectal bleeding and blood in emesis. Pt states this started happening around 1730 today. Pt c/o being weak. Pt A&Ox4. Pt describes bowel and emesis to resemble coffee grounds. Pt has hx of colitis and PCP is decreasing pt medication. Pt CBG 226, no hx of DM

## 2021-11-04 DIAGNOSIS — G4730 Sleep apnea, unspecified: Secondary | ICD-10-CM | POA: Diagnosis present

## 2021-11-04 DIAGNOSIS — K922 Gastrointestinal hemorrhage, unspecified: Secondary | ICD-10-CM

## 2021-11-04 DIAGNOSIS — K259 Gastric ulcer, unspecified as acute or chronic, without hemorrhage or perforation: Secondary | ICD-10-CM | POA: Diagnosis not present

## 2021-11-04 DIAGNOSIS — Z955 Presence of coronary angioplasty implant and graft: Secondary | ICD-10-CM | POA: Diagnosis not present

## 2021-11-04 DIAGNOSIS — E039 Hypothyroidism, unspecified: Secondary | ICD-10-CM | POA: Diagnosis present

## 2021-11-04 DIAGNOSIS — K254 Chronic or unspecified gastric ulcer with hemorrhage: Secondary | ICD-10-CM | POA: Diagnosis present

## 2021-11-04 DIAGNOSIS — M549 Dorsalgia, unspecified: Secondary | ICD-10-CM | POA: Diagnosis not present

## 2021-11-04 DIAGNOSIS — D649 Anemia, unspecified: Secondary | ICD-10-CM

## 2021-11-04 DIAGNOSIS — K319 Disease of stomach and duodenum, unspecified: Secondary | ICD-10-CM | POA: Diagnosis not present

## 2021-11-04 DIAGNOSIS — D62 Acute posthemorrhagic anemia: Secondary | ICD-10-CM | POA: Diagnosis present

## 2021-11-04 DIAGNOSIS — K2100 Gastro-esophageal reflux disease with esophagitis, without bleeding: Secondary | ICD-10-CM | POA: Diagnosis not present

## 2021-11-04 DIAGNOSIS — Z90710 Acquired absence of both cervix and uterus: Secondary | ICD-10-CM | POA: Diagnosis not present

## 2021-11-04 DIAGNOSIS — E781 Pure hyperglyceridemia: Secondary | ICD-10-CM | POA: Diagnosis present

## 2021-11-04 DIAGNOSIS — Z981 Arthrodesis status: Secondary | ICD-10-CM | POA: Diagnosis not present

## 2021-11-04 DIAGNOSIS — Z9049 Acquired absence of other specified parts of digestive tract: Secondary | ICD-10-CM | POA: Diagnosis not present

## 2021-11-04 DIAGNOSIS — J398 Other specified diseases of upper respiratory tract: Secondary | ICD-10-CM

## 2021-11-04 DIAGNOSIS — Z85828 Personal history of other malignant neoplasm of skin: Secondary | ICD-10-CM | POA: Diagnosis not present

## 2021-11-04 DIAGNOSIS — D801 Nonfamilial hypogammaglobulinemia: Secondary | ICD-10-CM | POA: Diagnosis present

## 2021-11-04 DIAGNOSIS — I10 Essential (primary) hypertension: Secondary | ICD-10-CM | POA: Diagnosis present

## 2021-11-04 DIAGNOSIS — J45909 Unspecified asthma, uncomplicated: Secondary | ICD-10-CM | POA: Diagnosis present

## 2021-11-04 DIAGNOSIS — K5710 Diverticulosis of small intestine without perforation or abscess without bleeding: Secondary | ICD-10-CM | POA: Diagnosis not present

## 2021-11-04 DIAGNOSIS — Z96611 Presence of right artificial shoulder joint: Secondary | ICD-10-CM | POA: Diagnosis present

## 2021-11-04 DIAGNOSIS — J849 Interstitial pulmonary disease, unspecified: Secondary | ICD-10-CM | POA: Diagnosis present

## 2021-11-04 DIAGNOSIS — D839 Common variable immunodeficiency, unspecified: Secondary | ICD-10-CM | POA: Diagnosis present

## 2021-11-04 DIAGNOSIS — Z79899 Other long term (current) drug therapy: Secondary | ICD-10-CM | POA: Diagnosis not present

## 2021-11-04 DIAGNOSIS — R739 Hyperglycemia, unspecified: Secondary | ICD-10-CM

## 2021-11-04 DIAGNOSIS — E559 Vitamin D deficiency, unspecified: Secondary | ICD-10-CM | POA: Diagnosis present

## 2021-11-04 DIAGNOSIS — I2510 Atherosclerotic heart disease of native coronary artery without angina pectoris: Secondary | ICD-10-CM | POA: Diagnosis present

## 2021-11-04 DIAGNOSIS — K250 Acute gastric ulcer with hemorrhage: Secondary | ICD-10-CM | POA: Diagnosis not present

## 2021-11-04 DIAGNOSIS — G8929 Other chronic pain: Secondary | ICD-10-CM | POA: Diagnosis present

## 2021-11-04 DIAGNOSIS — D696 Thrombocytopenia, unspecified: Secondary | ICD-10-CM | POA: Diagnosis present

## 2021-11-04 DIAGNOSIS — K921 Melena: Secondary | ICD-10-CM | POA: Diagnosis present

## 2021-11-04 DIAGNOSIS — E876 Hypokalemia: Secondary | ICD-10-CM | POA: Diagnosis present

## 2021-11-04 DIAGNOSIS — E785 Hyperlipidemia, unspecified: Secondary | ICD-10-CM | POA: Diagnosis present

## 2021-11-04 DIAGNOSIS — E872 Acidosis, unspecified: Secondary | ICD-10-CM | POA: Diagnosis present

## 2021-11-04 LAB — CBC
HCT: 24.3 % — ABNORMAL LOW (ref 36.0–46.0)
Hemoglobin: 8.2 g/dL — ABNORMAL LOW (ref 12.0–15.0)
MCH: 33.5 pg (ref 26.0–34.0)
MCHC: 33.7 g/dL (ref 30.0–36.0)
MCV: 99.2 fL (ref 80.0–100.0)
Platelets: 156 10*3/uL (ref 150–400)
RBC: 2.45 MIL/uL — ABNORMAL LOW (ref 3.87–5.11)
RDW: 15 % (ref 11.5–15.5)
WBC: 8.5 10*3/uL (ref 4.0–10.5)
nRBC: 0 % (ref 0.0–0.2)

## 2021-11-04 LAB — ABO/RH: ABO/RH(D): O POS

## 2021-11-04 LAB — PROCALCITONIN: Procalcitonin: <0.1 ng/mL

## 2021-11-04 LAB — LACTIC ACID, PLASMA: Lactic Acid, Venous: 1.2 mmol/L (ref 0.5–1.9)

## 2021-11-04 MED ORDER — MONTELUKAST SODIUM 10 MG PO TABS
10.0000 mg | ORAL_TABLET | Freq: Every day | ORAL | Status: DC
  Administered 2021-11-04 – 2021-11-06 (×3): 10 mg via ORAL
  Filled 2021-11-04 (×3): qty 1

## 2021-11-04 MED ORDER — PANTOPRAZOLE SODIUM 40 MG IV SOLR
40.0000 mg | Freq: Two times a day (BID) | INTRAVENOUS | Status: DC
  Administered 2021-11-04 – 2021-11-06 (×5): 40 mg via INTRAVENOUS
  Filled 2021-11-04 (×5): qty 10

## 2021-11-04 MED ORDER — IMMUNE GLOBULIN (HUMAN)-KLHW 10 GM/50ML ~~LOC~~ SOLN
10.0000 g | SUBCUTANEOUS | Status: DC
  Filled 2021-11-04: qty 10

## 2021-11-04 MED ORDER — HYDROCODONE-ACETAMINOPHEN 7.5-325 MG PO TABS
1.0000 | ORAL_TABLET | Freq: Four times a day (QID) | ORAL | Status: DC | PRN
  Administered 2021-11-05: 1 via ORAL
  Filled 2021-11-04: qty 1

## 2021-11-04 MED ORDER — TRIAMCINOLONE ACETONIDE 55 MCG/ACT NA AERO
2.0000 | INHALATION_SPRAY | Freq: Every day | NASAL | Status: DC
  Filled 2021-11-04 (×2): qty 21.6

## 2021-11-04 MED ORDER — LEVOTHYROXINE SODIUM 25 MCG PO TABS
25.0000 ug | ORAL_TABLET | Freq: Every day | ORAL | Status: DC
  Administered 2021-11-05 – 2021-11-06 (×2): 25 ug via ORAL
  Filled 2021-11-04 (×2): qty 1

## 2021-11-04 MED ORDER — ATORVASTATIN CALCIUM 40 MG PO TABS
40.0000 mg | ORAL_TABLET | Freq: Every day | ORAL | Status: DC
  Administered 2021-11-04 – 2021-11-06 (×3): 40 mg via ORAL
  Filled 2021-11-04 (×3): qty 1

## 2021-11-04 MED ORDER — PAROXETINE HCL 30 MG PO TABS
30.0000 mg | ORAL_TABLET | Freq: Every day | ORAL | Status: DC
  Administered 2021-11-04 – 2021-11-06 (×3): 30 mg via ORAL
  Filled 2021-11-04 (×3): qty 1

## 2021-11-04 MED ORDER — TRIAMCINOLONE ACETONIDE 55 MCG/ACT NA AERO
2.0000 | INHALATION_SPRAY | Freq: Every day | NASAL | Status: DC
  Administered 2021-11-04: 2 via NASAL
  Filled 2021-11-04: qty 10.8

## 2021-11-04 MED ORDER — GABAPENTIN 400 MG PO CAPS
800.0000 mg | ORAL_CAPSULE | Freq: Every day | ORAL | Status: DC
  Administered 2021-11-04 – 2021-11-05 (×2): 800 mg via ORAL
  Filled 2021-11-04 (×2): qty 2

## 2021-11-04 MED ORDER — BUDESONIDE 3 MG PO CPEP: 3.0000 mg | ORAL_CAPSULE | Freq: Every day | ORAL | Status: DC

## 2021-11-04 MED ORDER — SODIUM CHLORIDE 0.9 % IV SOLN
INTRAVENOUS | Status: DC
  Administered 2021-11-04: 125 mL/h via INTRAVENOUS

## 2021-11-04 MED ORDER — METHOCARBAMOL 500 MG PO TABS
500.0000 mg | ORAL_TABLET | Freq: Two times a day (BID) | ORAL | Status: DC
  Administered 2021-11-04 – 2021-11-06 (×5): 500 mg via ORAL
  Filled 2021-11-04 (×5): qty 1

## 2021-11-04 MED ORDER — IPRATROPIUM BROMIDE 0.03 % NA SOLN
1.0000 | Freq: Three times a day (TID) | NASAL | Status: DC
  Filled 2021-11-04: qty 30

## 2021-11-04 MED ORDER — LORATADINE 10 MG PO TABS: 10.0000 mg | ORAL_TABLET | Freq: Every day | ORAL | Status: DC | PRN

## 2021-11-04 NOTE — Assessment & Plan Note (Signed)
Continue levothyroxine 

## 2021-11-04 NOTE — Assessment & Plan Note (Addendum)
Patient with coffee-ground emesis, "clots" in her stools prior to admission.  In the setting of ASA/Brilinta for recent stent and Celebrex daily for chronic low back pain.  Hemodynamically stable and no further clinical bleeding observed. -Continue IV PPI - Consult GI, appreciate expertise - Trend hemoglobin

## 2021-11-04 NOTE — Hospital Course (Addendum)
Beverly [name] is a 76 y.o. F with CVID, CAD s/p DES in Apr 2023, ILD with severe tracheobronchomalacia, hx SVT, bronchiectasis, lymphocytic colitis, and hypothyroidism who presented with hematemesis and hematochezia for 1 day.    In the ER, Hgb 9/7 from [place] 12.6 in July.   CT abdomen pelvis showing mild distal colonic wall thickening compatible with colitis.  She takes aspirin, Brilinta, and celecoxib at home.

## 2021-11-04 NOTE — Assessment & Plan Note (Signed)
Hemoglobin 9.7 on admission, down to 8.2 today -Transfusion threshold 7 g/dL

## 2021-11-04 NOTE — Assessment & Plan Note (Signed)
-   Continue IVIG

## 2021-11-04 NOTE — Assessment & Plan Note (Signed)
-   Hold Celebrex - Continue Paxil, Robaxin, Norco, gabapentin

## 2021-11-04 NOTE — Assessment & Plan Note (Signed)
-   Consult GI, appreciate cares 

## 2021-11-04 NOTE — Assessment & Plan Note (Signed)
Not on home O2, stable here.

## 2021-11-04 NOTE — Telephone Encounter (Signed)
Sorry to hear about her symptoms, but glad she elected to come in to the hospital. I looked at her chart and she was admitted overnight. The inpatient GI team was consulted and will see her today. I will continue to check in on her chart to follow her evaluation and treatment plan.

## 2021-11-04 NOTE — ED Notes (Signed)
Pt ambulated to the bathroom with little to no assistance. Chronic pain in back was bothering her so pt was slightly stooped over.

## 2021-11-04 NOTE — H&P (Signed)
History and Physical    COTI BURD WCH:[id number] DOB: 1945-12-17 DOA: 11/03/2021  PCP: Isaac Bliss, Rayford Halsted, MD  Patient coming from: Home  Chief Complaint: Vomiting blood, rectal bleeding  HPI: Beverly [name] is a 76 y.o. female with medical history significant of asthma, CVID with bronchiectasis treated with subcutaneous immunoglobulin infusions, lymphocytic colitis on budesonide, skin cancer, Bowen's disease, nocardiosis, chronic pain/osteoarthritis on celecoxib, CAD status post PCI in April 2023 on aspirin and Brilinta, duodenal ulcer, GERD, internal hemorrhoids, hyperlipidemia, hypertension, hypothyroidism presented to the ED with complaints of hematemesis and hematochezia.  Hemodynamically stable on arrival.  Labs showing WBC 14.5, hemoglobin 9.7 with MCV 102.0 (hemoglobin 12.6 on 08/20/2021), FOBT positive, glucose 204, bicarb 24, BUN 37, creatinine 0.6, lactic acid 2.3.  CT abdomen pelvis showing mild distal colonic wall thickening compatible with colitis. Patient was given Zofran, IV Protonix 40 mg, and 500 cc normal saline bolus.  [facility] (Dr. Silverio Decamp) consulted.  Patient states yesterday afternoon she had an episode of coffee-ground emesis and soon after had rectal bleeding (bright red blood with dark clots).  She denies any further episodes since then.  She takes aspirin, Brilinta, and celecoxib at home.  Denies abdominal pain.  Denies dizziness, chest pain, or shortness of breath.  Review of Systems:  Review of Systems  All other systems reviewed and are negative.   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   right sholder   BCC (basal cell carcinoma of skin) 05/09/2014   mid lower back   BCC (basal cell  carcinoma of skin) 05/03/2017   right low back   BCC (basal cell carcinoma of skin) 05/03/2017   left upper back   BCC (basal cell carcinoma of skin) 07/05/2018   left mid back   BCC (basal cell carcinoma of skin) 05/20/1992   upper back   BCC (basal cell carcinoma of skin) 07/29/1993   left sholder medial   BCC (basal cell carcinoma of skin) 07/29/1993   left sholder lateral   BCC (basal cell carcinoma of skin) 07/29/1993   right thigh   BCC (basal cell carcinoma of skin) 07/29/1993   right sholder   BCC (basal cell carcinoma of skin) 12/22/1994   right mid forearm   BCC (basal cell carcinoma of skin) 12/22/1994   right upper forearm   BCC (basal cell carcinoma of skin) 12/22/1994   lower right upper arm   BCC (basal cell carcinoma of skin) 12/22/1994   right upper arm sholder   BCC (basal cell carcinoma of skin) 08/11/1995   left leg below knee   BCC (basal cell carcinoma of skin) 04/11/2002   mid back   BCC (basal cell carcinoma of skin) 12/10/2002   right center upper back   BCC (basal cell carcinoma of skin) 05/26/2005   right post sholder   BCC (basal cell carcinoma) 05/09/2014   left inner shin   BCC (basal cell carcinoma) 06/12/2014   left forearm   Bowen's disease 10/07/1994   right post knee, right inner forearm/wrist   Bowen's disease 08/11/1995   left sholder   Bronchiectasis ([place])    Cataract    left   Chronic  pain    Common variable immunodeficiency [facility])    sees dr Gayleen Orem 02-11-2021 epic   Coronary artery disease    Depression    hx of   Diverticulosis of colon 1998   mild   DJD (degenerative joint disease)    Duodenal ulcer    h/o yrs ago   GERD (gastroesophageal reflux disease)    History of SCC (squamous cell carcinoma) of skin    Dr. Denna Haggard   History of sinus bradycardia    HLD (hyperlipidemia)    hypertriglyceridemia   Hypertensive retinopathy of both eyes 11/18/2017   Hypothyroidism    IBS (irritable bowel syndrome)    Internal  hemorrhoids 1998   Lymphocytic colitis    Mitral regurgitation    mild   Nocardiosis    relased by infection disease dec 2022   Osteoarthritis    feet,shoulder,neck,back,hips and hands.   Pneumonia 2015   PONV (postoperative nausea and vomiting)    Rotator cuff tear, right 02/2019   infraspinatus and supraspinatus, and dislocation of long head of bicep tendon (Dr. Alvan Dame)   SCC (squamous cell carcinoma) 11/26/2014   left hand, right hand, right deltoid mnay areas   SCC (squamous cell carcinoma) 05/03/2017   left cheek   Sleep apnea    uses a mouth guard nightly   Squamous cell carcinoma in situ (SCCIS) 07/05/2018   left hand   Tracheobronchomalacia    Vitamin D deficiency    mild    Past Surgical History:  Procedure Laterality Date   ABDOMINAL HYSTERECTOMY  1998   complete   BLEPHAROPLASTY Bilateral 01/2018   BROW LIFT Bilateral 10/10/2018   Procedure: BROW LIFT;  Surgeon: Wallace Going, DO;  Location: [place];  Service: Plastics;  Laterality: Bilateral;   BUNIONECTOMY     R 12/08, L 2004 (Dr. Janus Molder)   [place]   bilateral   CARPAL TUNNEL RELEASE Left 03/06/2021   Procedure: Left Revision Carpal Tunnel Release with hypothenar fat pad flap;  Surgeon: [name], MD;  Location: [place];  Service: Orthopedics;  Laterality: Left;  with local anesthesia   CATARACT EXTRACTION Bilateral Left in 08/2012, Right 04/2016   Dr.Hecker   CESAREAN SECTION     1981 and [place]   laparoscopic   CLOSED REDUCTION NASAL FRACTURE N/A 08/29/2018   Procedure: CLOSED REDUCTION NASAL FRACTURE;  Surgeon: Wallace Going, DO;  Location: [place];  Service: Plastics;  Laterality: N/A;  1 hour, please   COLONOSCOPY  2006   COLONOSCOPY  01/2012   due again 01/2021; mild diverticulosis   CORONARY STENT INTERVENTION N/A 05/19/2021   Procedure: CORONARY STENT  INTERVENTION;  Surgeon: Nelva Bush, MD;  Location: [place] [facility];  Service: Cardiovascular;  Laterality: N/A;   epidural steroid injection, back  02/2010   HIP SURGERY     right bursectomy x 3   HIP SURGERY Right 2000   torn cartilage, repaired   INGUINAL HERNIA REPAIR  10/2007   bilat   INTRAVASCULAR ULTRASOUND/IVUS N/A 05/19/2021   Procedure: Intravascular Ultrasound/IVUS;  Surgeon: Nelva Bush, MD;  Location: [place] [facility];  Service: Cardiovascular;  Laterality: N/A;   LEFT HEART CATH AND CORONARY ANGIOGRAPHY N/A 05/19/2021   Procedure: LEFT HEART CATH AND CORONARY ANGIOGRAPHY;  Surgeon: Nelva Bush, MD;  Location: [place] [facility];  Service: Cardiovascular;  Laterality: N/A;  NECK SURGERY  1989   c6-7 cervical laminectomy and diskecotmy   REVERSE SHOULDER ARTHROPLASTY Right 04/11/2019   Procedure: REVERSE SHOULDER ARTHROPLASTY;  Surgeon: Nicholes Stairs, MD;  Location: [place];  Service: Orthopedics;  Laterality: Right;  Regional Block   SHOULDER SURGERY Left 03/2003   left rotator cuff repair   SHOULDER SURGERY Left 03/14/2018   rotator cuff repair; Dr. Tonita Cong   TONSILLECTOMY  age 5   TROCHANTERIC [place] EXTRACTION     as teenager     reports that she has never smoked. She has never used smokeless tobacco. She reports current alcohol use of about 7.0 standard drinks of alcohol per week. She reports that she does not use drugs.  Allergies  Allergen Reactions   Plavix [Clopidogrel] Rash   Adhesive [Tape] Rash   Codeine Rash    Family History  Problem Relation Age of Onset   Depression Mother    Schizophrenia Mother    Cancer Father        oral   Heart disease Father        bradycardia   Esophageal cancer Father    Cancer Sister        skin and lung   COPD Sister    Hypertension Sister    Osteoporosis Sister        compression fx's x 3 11/2018   Hyperthyroidism Brother     Cancer Brother        metastatic cancer to bone (?primary)   Diabetes Maternal Grandmother    Cancer Paternal Grandmother 34       colon cancer, metastatic to liver   Colon cancer Paternal Grandmother    Liver cancer Paternal Grandmother    Hyperlipidemia Daughter    Asthma Son    ADD / ADHD Other     Prior to Admission medications   Medication Sig Start Date End Date Taking? Authorizing Provider  acetaminophen (TYLENOL) 500 MG tablet Take 1,000 mg by mouth every 6 (six) hours as needed for moderate pain.    Yes [provider]  albuterol (VENTOLIN HFA) 108 (90 Base) MCG/ACT inhaler Inhale 2 puffs into the lungs every 6 (six) hours as needed for wheezing or shortness of breath. 08/12/21  Yes Kozlow, Donnamarie Poag, MD  aspirin EC 81 MG tablet Take 81 mg by mouth daily.   Yes [provider]  atorvastatin (LIPITOR) 40 MG tablet Take 1 tablet by mouth daily. 07/23/21 07/23/22 Yes Erline Hau, MD  budesonide (ENTOCORT EC) 3 MG 24 hr capsule Take 3 capsules (9 mg total) by mouth daily for 28 days, THEN 2 capsules (6 mg total) daily for 56 days, THEN 1 capsule (3 mg total) daily. 08/05/21 12/30/21 Yes Cirigliano, Vito V, DO  Buprenorphine HCl (BELBUCA) 750 MCG FILM Place 1 film to inside of mouth every 12 hours 09/12/21  Yes   Calcium Carbonate-Vitamin D 600-200 MG-UNIT TABS Take 1 tablet by mouth 2 (two) times daily.   Yes [provider]  celecoxib (CELEBREX) 200 MG capsule Take 1 capsule (200 mg total) by mouth 2 (two) times daily. 10/23/21  Yes Isaac Bliss, Rayford Halsted, MD  colestipol (COLESTID) 1 g tablet Take 1 tablet by mouth daily. 10/21/21  Yes Kozlow, Donnamarie Poag, MD  diclofenac Sodium (VOLTAREN) 1 % GEL Apply 2 g topically 4 (four) times daily as needed (joint pain). 12/29/18  Yes [provider]  Docusate  Sodium 100 MG capsule Take 100 mg by mouth daily. 09/27/20  Yes [provider]  famotidine (PEPCID) 40 MG tablet Take 1 tablet by mouth at  bedtime. 08/12/21  Yes Kozlow, Donnamarie Poag, MD  Fluorouracil (TOLAK) 4 % CREA Apply to aa total 28 applications & sun protect 04/07/21  Yes Lavonna Monarch, MD  gabapentin (NEURONTIN) 800 MG tablet Take 1 tablet by mouth at bedtime. Patient taking differently: Take 800 mg by mouth at bedtime. 10/29/21  Yes   Guaifenesin 1200 MG TB12 Take 1,200 mg by mouth 2 (two) times daily.   Yes [provider]  HYDROcodone-acetaminophen (NORCO) 7.5-325 MG tablet Take 1 tablet by mouth four times a day as needed for pain 06/20/21  Yes   ipratropium (ATROVENT) 0.03 % nasal spray Place 1 spray into both nostrils 3 (three) times daily. 08/12/21  Yes Kozlow, Donnamarie Poag, MD  Lactobacillus-Inulin (CULTURELLE ADULT ULT BALANCE) CAPS Take 1 capsule by mouth daily.   Yes [provider]  levothyroxine (SYNTHROID) 25 MCG tablet Take 1 tablet (25 mcg total) by mouth daily. 05/08/21  Yes Isaac Bliss, Rayford Halsted, MD  loratadine (CLARITIN) 10 MG tablet Take 1 tablet (10 mg total) by mouth daily as needed for allergies (Can take an extra dose during flare ups.). 08/12/21  Yes Kozlow, Donnamarie Poag, MD  Melatonin-Pyridoxine (MELATIN PO) Take 5 mg by mouth at bedtime. 02/13/20  Yes [provider]  methocarbamol (ROBAXIN) 500 MG tablet Take 1 tablet by mouth three times a day as needed Patient taking differently: Take 500 mg by mouth 2 (two) times daily. 06/20/21  Yes   montelukast (SINGULAIR) 10 MG tablet Take 1 tablet (10 mg total) by mouth at bedtime. Patient taking differently: Take 10 mg by mouth daily. 08/12/21  Yes Kozlow, Donnamarie Poag, MD  Multiple Vitamins-Minerals (MULTIVITAMIN WITH MINERALS) tablet Take 1 tablet by mouth daily.   Yes [provider]  nitroGLYCERIN (NITROSTAT) 0.4 MG SL tablet Place 1 tablet (0.4 mg total) under the tongue every 5 (five) minutes as needed. 05/20/21  Yes Reino Bellis B, NP  omega-3 acid ethyl esters (LOVAZA) 1 g capsule TAKE 2 CAPSULES BY MOUTH 2 TIMES DAILY Patient taking  differently: Take 2 g by mouth 2 (two) times daily. 10/15/21 10/15/22 Yes Erline Hau, MD  ondansetron (ZOFRAN-ODT) 4 MG disintegrating tablet Take 1 tablet by mouth every 6 (six) hours as needed for nausea or vomiting. 03/25/21  Yes Cirigliano, Vito V, DO  pantoprazole (PROTONIX) 40 MG tablet Take 1 tablet (40 mg total) by mouth daily with breakfast. 08/07/21  Yes Josue [name], MD  PARoxetine (PAXIL) 30 MG tablet Take 1 tablet by mouth once a day. Patient taking differently: Take 30 mg by mouth at bedtime. 06/12/21  Yes   ticagrelor (BRILINTA) 90 MG TABS tablet Take 1 tablet by mouth 2 times daily. (stop Plavix (clopidogrel)) 06/16/21  Yes Imogene Burn, PA-C  triamcinolone (NASACORT) 55 MCG/ACT AERO nasal inhaler Place 2 sprays into each nostril once daily. 10/10/21  Yes Kozlow, Donnamarie Poag, MD  Vitamin D, Ergocalciferol, (DRISDOL) 1.25 MG (50000 UNIT) CAPS capsule Take 1 capsule (50,000 Units total) by mouth every 7 (seven) days for 12 doses. 08/12/21 11/04/21 Yes Erline Hau, MD  XEMBIFY 10 GM/50ML SOLN Inject 10 g into the skin once a week. Wednesday 07/21/21  Yes [provider]  EPINEPHrine 0.3 mg/0.3 mL IJ SOAJ injection Inject 0.3 mg into the muscle as needed for anaphylaxis. Patient not  taking: Reported on 11/04/2021 01/21/21   [provider]  potassium chloride (KLOR-CON) 10 MEQ tablet Take 2 tablets (total 20 MEQ) by mouth once 03/29/20 04/16/20  Rosiland Oz, MD    Physical Exam: Vitals:   11/04/21 0500 11/04/21 0600 11/04/21 0700 11/04/21 0815  BP: (!) 130/56 111/61 (!) 104/51   Pulse: 91 89 64   Resp: '15 16 12   '$ Temp:    99.5 F (37.5 C)  TempSrc:    Oral  SpO2: 100% 100% 96%     Physical Exam Vitals reviewed.  Constitutional:      General: She is not in acute distress. HENT:     Head: Normocephalic and atraumatic.  Eyes:     Extraocular Movements: Extraocular movements intact.  Cardiovascular:     Rate and Rhythm: Normal rate and  regular rhythm.     Pulses: Normal pulses.  Pulmonary:     Effort: Pulmonary effort is normal. No respiratory distress.     Breath sounds: Normal breath sounds. No wheezing or rales.  Abdominal:     General: Bowel sounds are normal. There is no distension.     Palpations: Abdomen is soft.     Tenderness: There is abdominal tenderness. There is no rebound.     Comments: Generalized tenderness to palpation  Musculoskeletal:        General: No swelling or tenderness.     Cervical back: Normal range of motion.  Skin:    General: Skin is warm and dry.  Neurological:     General: No focal deficit present.     Mental Status: She is alert and oriented to person, place, and time.      Labs on Admission: I have personally reviewed following labs and imaging studies  CBC: Recent Labs  Lab 11/03/21 1954 11/04/21 0604  WBC 14.5* 8.5  NEUTROABS 12.0*  --   HGB 9.7* 8.2*  HCT 30.0* 24.3*  MCV 102.0* 99.2  PLT 188 382   Basic Metabolic Panel: Recent Labs  Lab 11/03/21 1954  NA 141  K 3.8  CL 107  CO2 24  GLUCOSE 204*  BUN 37*  CREATININE 0.62  CALCIUM 9.1   GFR: Estimated Creatinine Clearance: 55.3 mL/min (by C-G formula based on SCr of 0.62 mg/dL). Liver Function Tests: No results for input(s): "AST", "ALT", "ALKPHOS", "BILITOT", "PROT", "ALBUMIN" in the last 168 hours. No results for input(s): "LIPASE", "AMYLASE" in the last 168 hours. No results for input(s): "AMMONIA" in the last 168 hours. Coagulation Profile: No results for input(s): "INR", "PROTIME" in the last 168 hours. Cardiac Enzymes: No results for input(s): "CKTOTAL", "CKMB", "CKMBINDEX", "TROPONINI" in the last 168 hours. BNP (last 3 results) No results for input(s): "PROBNP" in the last 8760 hours. HbA1C: No results for input(s): "HGBA1C" in the last 72 hours. CBG: No results for input(s): "GLUCAP" in the last 168 hours. Lipid Profile: No results for input(s): "CHOL", "HDL", "LDLCALC", "TRIG",  "CHOLHDL", "LDLDIRECT" in the last 72 hours. Thyroid Function Tests: No results for input(s): "TSH", "T4TOTAL", "FREET4", "T3FREE", "THYROIDAB" in the last 72 hours. Anemia Panel: No results for input(s): "VITAMINB12", "FOLATE", "FERRITIN", "TIBC", "IRON", "RETICCTPCT" in the last 72 hours. Urine analysis:    Component Value Date/Time   LABSPEC 1.020 06/10/2017 1446   BILIRUBINUR negative 06/10/2017 1446   BILIRUBINUR 2+ 03/12/2016 0856   KETONESUR negative 06/10/2017 1446   PROTEINUR negative 06/10/2017 1446   PROTEINUR trace 03/12/2016 0856   UROBILINOGEN 0.2 03/12/2016 0856   NITRITE Negative  06/10/2017 1446   NITRITE neg 03/12/2016 0856   LEUKOCYTESUR Negative 06/10/2017 1446    Radiological Exams on Admission: CT ABDOMEN PELVIS W CONTRAST  Result Date: 11/03/2021 CLINICAL DATA:  Rectal bleeding, hematemesis, recent diagnosis of colitis EXAM: CT ABDOMEN AND PELVIS WITH CONTRAST TECHNIQUE: Multidetector CT imaging of the abdomen and pelvis was performed using the standard protocol following bolus administration of intravenous contrast. RADIATION DOSE REDUCTION: This exam was performed according to the departmental dose-optimization program which includes automated exposure control, adjustment of the mA and/or kV according to patient size and/or use of iterative reconstruction technique. CONTRAST:  32m OMNIPAQUE IOHEXOL 350 MG/ML SOLN COMPARISON:  None Available. FINDINGS: Lower chest: No acute pleural or parenchymal lung disease. Hepatobiliary: Mild hepatic steatosis. No focal liver abnormality. Cholecystectomy. Pancreas: Unremarkable. No pancreatic ductal dilatation or surrounding inflammatory changes. Spleen: Normal in size without focal abnormality. Adrenals/Urinary Tract: Adrenal glands are unremarkable. Kidneys are normal, without renal calculi, focal lesion, or hydronephrosis. Bladder is unremarkable. Stomach/Bowel: No bowel obstruction or ileus. There is mild diffuse colonic wall  thickening from the splenic flexure through the rectum, compatible with given history of recent colitis. Normal appendix right lower quadrant. Vascular/Lymphatic: Aortic atherosclerosis. No enlarged abdominal or pelvic lymph nodes. Reproductive: Status post hysterectomy. No adnexal masses. Other: No free fluid or free intraperitoneal gas. No abdominal wall hernia. Musculoskeletal: No acute or destructive bony lesions. Multilevel lumbar spondylosis and facet hypertrophy, with grade 1 degenerative anterolisthesis of L4 on L5. Reconstructed images demonstrate no additional findings. IMPRESSION: 1. Mild distal colonic wall thickening compatible with colitis. 2.  Aortic Atherosclerosis (ICD10-I70.0). Electronically Signed   By: MRanda NgoM.D.   On: 11/03/2021 23:12    Assessment and Plan  Acute GI bleed Acute blood loss anemia Patient presenting with complaints of one episode of coffee-ground emesis and hematochezia.  No further episodes since arrival to the ED.  She is on aspirin, Brilinta, and celecoxib.  CT showing mild distal colonic wall thickening compatible with colitis.  She has a history of lymphocytic colitis.  Remains hemodynamically stable.  Hemoglobin 9.7 >8.2, was 12.6 on 7/5.  FOBT positive.  Does not appear to be symptomatic from her anemia.   Colonoscopy done in April 2022 showing one 2 mm polyp which was resected and internal hemorrhoids.  No recent EGD results in the chart. -Hold aspirin, Brilinta, celecoxib -IV Protonix 40 mg every 12 hours -IV fluid hydration -Keep n.p.o. -[facility] consulted -Type and screen -Monitor H&H and transfuse PRBCs if Hgb <8 given CAD.  Discussed with the patient and she has given verbal consent for blood transfusion.  Lymphocytic colitis -Management per GI  CVID -Continue weekly subcutaneous immunoglobulin infusions  Chronic pain -Hold celecoxib given GI bleed -Continue gabapentin and Robaxin -She is also on Belbuca.  Informed by pharmacist  that Belbuca films are not on the formulary, patient has to use her home medication.  Pharmacist will speak with the patient and place orders.  CAD status post PCI in April 2023 -Not endorsing any anginal symptoms -Hold aspirin and Brilinta at this time given acute GI bleed  Mild lactic acidosis Resolved after 500 cc fluid bolus.  No fever.  No leukocytosis on repeat labs.  Procalcitonin <0.10.   Hyperglycemia No documented history of diabetes.  Glucose 204. -Check A1c  Hyperlipidemia -Continue Lipitor  DVT prophylaxis: SCDs Code Status: Full Code (discussed with the patient) Family Communication: No family available at this time. Consults called: GI  Level of care: Progressive Care Unit  Admission status: It is my clinical opinion that referral for OBSERVATION is reasonable and necessary in this patient based on the above information provided. The aforementioned taken together are felt to place the patient at high risk for further clinical deterioration. However, it is anticipated that the patient may be medically stable for discharge from the hospital within 24 to 48 hours.   Shela Leff MD [facility]  If 7PM-7AM, please contact night-coverage [email or web address]  11/04/2021, 9:25 AM

## 2021-11-04 NOTE — Assessment & Plan Note (Signed)
No prior hx DM.  Glucose >200 on admission - Check A1c

## 2021-11-04 NOTE — H&P (View-Only) (Signed)
[facility] Consult: 1:58 PM 11/04/2021  LOS: 0 days    Referring Provider: Dr Loleta Books.    Primary Care Physician:  Isaac Bliss, Rayford Halsted, MD Primary Gastroenterologist:  Dr. Bryan Lemma    Reason for Consultation: Vomiting, rectal bleeding.   HPI: Beverly [name] is a 76 y.o. female.  PMH hypogammaglobulinemia, treated with IVIG infusions every Wednesday OSA.  Pulmonary nocardia infection on chronic Bactrim.  Asthma.  Bronchiectasis.  Lymphocytic colitis on budesonide.  Skin cancer.  Chronic pain/osteoarthritis.  CAD with Pstent x 2 in April 2023 for severe two-vessel disease.  On aspirin/Brilinta.  Hypertension.  Hyperlipidemia.  Hypothyroidism.  Surgeries include reduction of a nasal fracture, brow lift, blepharoplasty, shoulder arthroscopy, carpal tunnel release, lumbar spine fusion.  05/2020 colonoscopy.  For evaluation of diarrhea.  The entire colonic mucosa appeared grossly normal biopsies obtained throughout.  2 mm, sessile polyp at distal rectum cold biopsied.  Nonbleeding internal hemorrhoids.  Excessive looping at the splenic flexure, ascending colon required abdominal pressure to advance scope. Pathology confirmed lymphocytic colitis.  GERD. On colonoscopies prior to this there was mild sigmoid diverticulosis noted. Had an EGD in the 1990s and had ulcers but she does not recall whether these were bleeding ulcers or the circumstances for the ED  Patient normally takes PPI once daily and Pepcid at night for reflux.  In the last few weeks she has had a few courses of antibiotics for sinusitis.  At the latest prescription for antibiotics the MD had her increase her PPI to twice daily.  This improved her reflux symptoms.  She also takes Celebrex daily for arthritis.  Yesterday morning she had weakness,  fatigue and slept a lot during the morning.  She got up a couple of times to have a bowel movement which were diarrheal but she did not visualize the color of these.  At about 4:30 PM she got up and she felt even more weak.  She went to the bathroom and vomited dark material and had a dark, incontinent stool.  Since that time she has not had any further vomiting or bowel movements.  Although she felt very weak and had to crawl back to bed after the episode of dark emesis, she never fainted.  There was no shortness of breath or chest pressure.  She got some Zofran this morning.  At baseline regarding her lymphocytic colitis, she is down to taking 1 budesonide daily and has not had any diarrhea until the events yesterday  Came to the ED last night.  Started on Protonix 40 IV every 12 hours. No hypotension.  No tachycardia.  Excellent room air sats.  HGB 9.7... 8.2.  In April she had a low of 10.9.  Highest Hb was 13.8 in late June, 12.6 in early July. FOBT positive BUN is 37, previously well within normal limits in the past several months.  Creatinine is normal.      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   right sholder   BCC (basal cell carcinoma of skin) 05/09/2014   mid lower back   BCC (basal cell carcinoma of skin) 05/03/2017   right low back   BCC (basal cell carcinoma of skin) 05/03/2017   left upper back   BCC (basal cell carcinoma of skin) 07/05/2018   left mid back   BCC (basal cell carcinoma of skin) 05/20/1992   upper back   BCC (basal cell carcinoma of skin) 07/29/1993   left sholder medial   BCC (basal cell carcinoma of skin) 07/29/1993   left sholder lateral   BCC (basal cell carcinoma of skin) 07/29/1993   right thigh    BCC (basal cell carcinoma of skin) 07/29/1993   right sholder   BCC (basal cell carcinoma of skin) 12/22/1994   right mid forearm   BCC (basal cell carcinoma of skin) 12/22/1994   right upper forearm   BCC (basal cell carcinoma of skin) 12/22/1994   lower right upper arm   BCC (basal cell carcinoma of skin) 12/22/1994   right upper arm sholder   BCC (basal cell carcinoma of skin) 08/11/1995   left leg below knee   BCC (basal cell carcinoma of skin) 04/11/2002   mid back   BCC (basal cell carcinoma of skin) 12/10/2002   right center upper back   BCC (basal cell carcinoma of skin) 05/26/2005   right post sholder   BCC (basal cell carcinoma) 05/09/2014   left inner shin   BCC (basal cell carcinoma) 06/12/2014   left forearm   Bowen's disease 10/07/1994   right post knee, right inner forearm/wrist   Bowen's disease 08/11/1995   left sholder   Bronchiectasis ([place])    Cataract    left   Chronic pain    Common variable immunodeficiency [facility])    sees dr Gayleen Orem 02-11-2021 epic   Coronary artery disease    Depression    hx of   Diverticulosis of colon 1998   mild   DJD (degenerative joint disease)    Duodenal ulcer    h/o yrs ago   GERD (gastroesophageal reflux disease)    History of SCC (squamous cell carcinoma) of skin    Dr. Denna Haggard   History of sinus bradycardia    HLD (hyperlipidemia)    hypertriglyceridemia   Hypertensive retinopathy of both eyes 11/18/2017   Hypothyroidism    IBS (irritable bowel syndrome)    Internal hemorrhoids 1998   Lymphocytic colitis    Mitral regurgitation    mild   Nocardiosis    relased by infection disease dec 2022   Osteoarthritis    feet,shoulder,neck,back,hips and hands.   Pneumonia 2015   PONV (postoperative nausea and vomiting)    Rotator cuff tear, right 02/2019   infraspinatus and supraspinatus, and dislocation of long head of bicep tendon (Dr. Alvan Dame)   SCC (squamous cell carcinoma) 11/26/2014   left hand, right hand,  right deltoid mnay areas   SCC (squamous cell carcinoma) 05/03/2017   left cheek   Sleep apnea    uses a mouth guard nightly   Squamous cell carcinoma in situ (SCCIS) 07/05/2018   left hand   Tracheobronchomalacia    Vitamin D deficiency    mild    Past Surgical History:  Procedure  Laterality Date   ABDOMINAL HYSTERECTOMY  1998   complete   BLEPHAROPLASTY Bilateral 01/2018   BROW LIFT Bilateral 10/10/2018   Procedure: BROW LIFT;  Surgeon: Wallace Going, DO;  Location: [place];  Service: Plastics;  Laterality: Bilateral;   BUNIONECTOMY     R 12/08, L 2004 (Dr. Janus Molder)   [place]   bilateral   CARPAL TUNNEL RELEASE Left 03/06/2021   Procedure: Left Revision Carpal Tunnel Release with hypothenar fat pad flap;  Surgeon: [name], MD;  Location: [place];  Service: Orthopedics;  Laterality: Left;  with local anesthesia   CATARACT EXTRACTION Bilateral Left in 08/2012, Right 04/2016   Dr.Hecker   CESAREAN SECTION     1981 and [place]   laparoscopic   CLOSED REDUCTION NASAL FRACTURE N/A 08/29/2018   Procedure: CLOSED REDUCTION NASAL FRACTURE;  Surgeon: Wallace Going, DO;  Location: [place];  Service: Plastics;  Laterality: N/A;  1 hour, please   COLONOSCOPY  2006   COLONOSCOPY  01/2012   due again 01/2021; mild diverticulosis   CORONARY STENT INTERVENTION N/A 05/19/2021   Procedure: CORONARY STENT INTERVENTION;  Surgeon: Nelva Bush, MD;  Location: [place] CV LAB;  Service: Cardiovascular;  Laterality: N/A;   epidural steroid injection, back  02/2010   HIP SURGERY     right bursectomy x 3   HIP SURGERY Right 2000   torn cartilage, repaired   INGUINAL HERNIA REPAIR  10/2007   bilat   INTRAVASCULAR ULTRASOUND/IVUS N/A 05/19/2021   Procedure: Intravascular Ultrasound/IVUS;  Surgeon: Nelva Bush, MD;  Location: [place] CV  LAB;  Service: Cardiovascular;  Laterality: N/A;   LEFT HEART CATH AND CORONARY ANGIOGRAPHY N/A 05/19/2021   Procedure: LEFT HEART CATH AND CORONARY ANGIOGRAPHY;  Surgeon: Nelva Bush, MD;  Location: [place] [facility];  Service: Cardiovascular;  Laterality: N/A;   NECK SURGERY  1989   c6-7 cervical laminectomy and diskecotmy   REVERSE SHOULDER ARTHROPLASTY Right 04/11/2019   Procedure: REVERSE SHOULDER ARTHROPLASTY;  Surgeon: Nicholes Stairs, MD;  Location: [place];  Service: Orthopedics;  Laterality: Right;  Regional Block   SHOULDER SURGERY Left 03/2003   left rotator cuff repair   SHOULDER SURGERY Left 03/14/2018   rotator cuff repair; Dr. Tonita Cong   TONSILLECTOMY  age 27   TROCHANTERIC [place] EXTRACTION     as teenager    Prior to Admission medications   Medication Sig Start Date End Date Taking? Authorizing Provider  acetaminophen (TYLENOL) 500 MG tablet Take 1,000 mg by mouth every 6 (six) hours as needed for moderate pain.    Yes [provider]  albuterol (VENTOLIN HFA) 108 (90 Base) MCG/ACT inhaler Inhale 2 puffs into the lungs every 6 (six) hours as needed for wheezing or shortness of breath. 08/12/21  Yes Kozlow, Donnamarie Poag, MD  aspirin EC 81 MG tablet Take 81 mg by mouth daily.   Yes [provider]  atorvastatin (LIPITOR) 40 MG tablet Take 1 tablet by mouth daily. 07/23/21 07/23/22 Yes Erline Hau, MD  budesonide (ENTOCORT EC) 3 MG 24 hr capsule Take 3 capsules (9 mg total) by mouth daily for 28 days, THEN 2 capsules (6 mg total) daily for 56 days, THEN 1 capsule (3 mg total) daily. 08/05/21 12/30/21 Yes Cirigliano, Dominic Pea, DO  Buprenorphine HCl (BELBUCA) 750 MCG FILM Place 1 film to inside of mouth every 12 hours 09/12/21  Yes   Calcium Carbonate-Vitamin D 600-200 MG-UNIT TABS Take 1 tablet by mouth 2 (two) times daily.   Yes [provider]  celecoxib (CELEBREX) 200 MG capsule Take  1 capsule (200 mg total) by mouth 2 (two) times daily. 10/23/21  Yes Isaac Bliss, Rayford Halsted, MD  colestipol (COLESTID) 1 g tablet Take 1 tablet by mouth daily. 10/21/21  Yes Kozlow, Donnamarie Poag, MD  diclofenac Sodium (VOLTAREN) 1 % GEL Apply 2 g topically 4 (four) times daily as needed (joint pain). 12/29/18  Yes [provider]  Docusate Sodium 100 MG capsule Take 100 mg by mouth daily. 09/27/20  Yes [provider]  famotidine (PEPCID) 40 MG tablet Take 1 tablet by mouth at bedtime. 08/12/21  Yes Kozlow, Donnamarie Poag, MD  Fluorouracil (TOLAK) 4 % CREA Apply to aa total 28 applications & sun protect 04/07/21  Yes Lavonna Monarch, MD  gabapentin (NEURONTIN) 800 MG tablet Take 1 tablet by mouth at bedtime. Patient taking differently: Take 800 mg by mouth at bedtime. 10/29/21  Yes   Guaifenesin 1200 MG TB12 Take 1,200 mg by mouth 2 (two) times daily.   Yes [provider]  HYDROcodone-acetaminophen (NORCO) 7.5-325 MG tablet Take 1 tablet by mouth four times a day as needed for pain 06/20/21  Yes   ipratropium (ATROVENT) 0.03 % nasal spray Place 1 spray into both nostrils 3 (three) times daily. 08/12/21  Yes Kozlow, Donnamarie Poag, MD  Lactobacillus-Inulin (CULTURELLE ADULT ULT BALANCE) CAPS Take 1 capsule by mouth daily.   Yes [provider]  levothyroxine (SYNTHROID) 25 MCG tablet Take 1 tablet (25 mcg total) by mouth daily. 05/08/21  Yes Isaac Bliss, Rayford Halsted, MD  loratadine (CLARITIN) 10 MG tablet Take 1 tablet (10 mg total) by mouth daily as needed for allergies (Can take an extra dose during flare ups.). 08/12/21  Yes Kozlow, Donnamarie Poag, MD  Melatonin-Pyridoxine (MELATIN PO) Take 5 mg by mouth at bedtime. 02/13/20  Yes [provider]  methocarbamol (ROBAXIN) 500 MG tablet Take 1 tablet by mouth three times a day as needed Patient taking differently: Take 500 mg by mouth 2 (two) times daily. 06/20/21  Yes   montelukast (SINGULAIR) 10 MG tablet Take 1 tablet (10 mg total) by mouth  at bedtime. Patient taking differently: Take 10 mg by mouth daily. 08/12/21  Yes Kozlow, Donnamarie Poag, MD  Multiple Vitamins-Minerals (MULTIVITAMIN WITH MINERALS) tablet Take 1 tablet by mouth daily.   Yes [provider]  nitroGLYCERIN (NITROSTAT) 0.4 MG SL tablet Place 1 tablet (0.4 mg total) under the tongue every 5 (five) minutes as needed. 05/20/21  Yes Reino Bellis B, NP  omega-3 acid ethyl esters (LOVAZA) 1 g capsule TAKE 2 CAPSULES BY MOUTH 2 TIMES DAILY Patient taking differently: Take 2 g by mouth 2 (two) times daily. 10/15/21 10/15/22 Yes Erline Hau, MD  ondansetron (ZOFRAN-ODT) 4 MG disintegrating tablet Take 1 tablet by mouth every 6 (six) hours as needed for nausea or vomiting. 03/25/21  Yes Cirigliano, Vito V, DO  pantoprazole (PROTONIX) 40 MG tablet Take 1 tablet (40 mg total) by mouth daily with breakfast. 08/07/21  Yes Josue [name], MD  PARoxetine (PAXIL) 30 MG tablet Take 1 tablet by mouth once a day. Patient taking differently: Take 30 mg by mouth at bedtime. 06/12/21  Yes   ticagrelor (BRILINTA) 90 MG TABS tablet Take 1  tablet by mouth 2 times daily. (stop Plavix (clopidogrel)) 06/16/21  Yes Imogene Burn, PA-C  triamcinolone (NASACORT) 55 MCG/ACT AERO nasal inhaler Place 2 sprays into each nostril once daily. 10/10/21  Yes Kozlow, Donnamarie Poag, MD  Vitamin D, Ergocalciferol, (DRISDOL) 1.25 MG (50000 UNIT) CAPS capsule Take 1 capsule (50,000 Units total) by mouth every 7 (seven) days for 12 doses. 08/12/21 11/04/21 Yes Erline Hau, MD  XEMBIFY 10 GM/50ML SOLN Inject 10 g into the skin once a week. Wednesday 07/21/21  Yes [provider]  EPINEPHrine 0.3 mg/0.3 mL IJ SOAJ injection Inject 0.3 mg into the muscle as needed for anaphylaxis. Patient not taking: Reported on 11/04/2021 01/21/21   [provider]  potassium chloride (KLOR-CON) 10 MEQ tablet Take 2 tablets (total 20 MEQ) by mouth once 03/29/20 04/16/20  Rosiland Oz, MD     Scheduled Meds:  atorvastatin  40 mg Oral Daily   gabapentin  800 mg Oral QHS   [START ON 11/05/2021] Immune Globulin (Human)-klhw  10 g Subcutaneous Weekly   ipratropium  1 spray Each Nare TID   [START ON 11/05/2021] levothyroxine  25 mcg Oral Daily   methocarbamol  500 mg Oral BID   montelukast  10 mg Oral Daily   pantoprazole (PROTONIX) IV  40 mg Intravenous Q12H   PARoxetine  30 mg Oral Daily   triamcinolone  2 spray Nasal Daily   Infusions:  sodium chloride 125 mL/hr (11/04/21 0551)   PRN Meds: HYDROcodone-acetaminophen, loratadine   Allergies as of 11/03/2021 - Review Complete 10/27/2021  Allergen Reaction Noted   Plavix [clopidogrel] Rash 06/16/2021   Adhesive [tape] Rash 10/08/2010   Codeine Rash 10/08/2010    Family History  Problem Relation Age of Onset   Depression Mother    Schizophrenia Mother    Cancer Father        oral   Heart disease Father        bradycardia   Esophageal cancer Father    Cancer Sister        skin and lung   COPD Sister    Hypertension Sister    Osteoporosis Sister        compression fx's x 3 11/2018   Hyperthyroidism Brother    Cancer Brother        metastatic cancer to bone (?primary)   Diabetes Maternal Grandmother    Cancer Paternal Grandmother 43       colon cancer, metastatic to liver   Colon cancer Paternal Grandmother    Liver cancer Paternal Grandmother    Hyperlipidemia Daughter    Asthma Son    ADD / ADHD Other     Social History   Socioeconomic History   Marital status: Divorced    Spouse name: Not on file   Number of children: 2   Years of education: Not on file   Highest education level: Master's degree (e.g., MA, MS, MEng, MEd, MSW, MBA)  Occupational History   Occupation: STAFF Optician, dispensing: [employer]   Occupation: Retired  Tobacco Use   Smoking status: Never   Smokeless tobacco: Never  Vaping Use   Vaping Use: Never used  Substance and Sexual Activity   Alcohol use: Yes     Alcohol/week: 7.0 standard drinks of alcohol    Types: 7 Glasses of wine per week    Comment: 1 glass wine per day   Drug use: No   Sexual activity: Not Currently  Other  Topics Concern   Not on file  Social History Narrative   Divorced, lives alone, [place]; 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   [place] for [employer] part-time   Social Determinants of Health   Financial Resource Strain: [place]  (05/17/2021)   Overall Financial Resource Strain (CARDIA)    Difficulty of Paying Living Expenses: Not hard at all  Food Insecurity: No [place] (05/17/2021)   Hunger Vital Sign    Worried About Running Out of Food in the Last Year: Never true    [place] in the Last Year: Never true  Transportation Needs: No Transportation Needs (05/17/2021)   PRAPARE - Hydrologist (Medical): No    Lack of Transportation (Non-Medical): No  Physical Activity: Insufficiently Active (05/17/2021)   Exercise Vital Sign    Days of Exercise per Week: 1 day    Minutes of Exercise per Session: 10 min  Stress: No Stress Concern Present (05/17/2021)   [place]    Feeling of Stress : Not at all  Social Connections: Moderately Integrated (05/17/2021)   Social Connection and Isolation Panel [NHANES]    Frequency of Communication with Friends and Family: More than three times a week    Frequency of Social Gatherings with Friends and Family: More than three times a week    Attends Religious Services: More than 4 times per year    Active Member of [employer] or Organizations: Yes    Attends Archivist Meetings: More than 4 times per year    Marital Status: Divorced  Intimate Partner Violence: Not At Risk (02/04/2021)   Humiliation, Afraid, Rape, and Kick questionnaire    Fear of  Current or Ex-Partner: No    Emotionally Abused: No    Physically Abused: No    Sexually Abused: No    REVIEW OF SYSTEMS: Constitutional: Weakness. ENT:  No nose bleeds Pulm: No shortness of breath CV:  No palpitations, no LE edema.  No chest pressure/pain GU:  No hematuria, no frequency GI: Per HPI. Heme: Since starting Brilinta she bruises easily but has not had any excessive bleeding. Transfusions: No transfusions before. Neuro:  No headaches, no peripheral tingling or numbness Derm:  No itching, no rash or sores.  Endocrine:  No sweats or chills.  No polyuria or dysuria Immunization: Not queried Travel: Not queried   PHYSICAL EXAM: Vital signs in last 24 hours: Vitals:   11/04/21 0815 11/04/21 1100  BP:  (!) 110/47  Pulse:  67  Resp:  12  Temp: 99.5 F (37.5 C)   SpO2:  96%   Wt Readings from Last 3 Encounters:  10/27/21 64.1 kg  10/21/21 64.8 kg  10/17/21 66.2 kg    General: Pleasant, looks a bit pale but not acutely ill. Head: No facial asymmetry or swelling.  No signs of head trauma. Eyes: No conjunctival pallor or scleral icterus Ears: No hearing deficit Nose: Congested but no discharge Mouth: Tongue midline.  Good dentition.  Mucosa moist, pink, clear. Neck: No JVD Lungs: Clear bilaterally without labored breathing or cough Heart: RRR.  No MRG.  S1, S2 present Abdomen: Soft.  Some tenderness across the upper abdomen without guarding or rebound.  Patient notes that she only has this discomfort with exam.  No HSM, masses, bruits, hernias Rectal: Deferred Musc/Skeltl: No  joint redness, swelling or gross deformity. Extremities: No CCE. Neurologic: Alert.  Good historian.  Moves all 4 limbs without weakness.  No tremors.  Fully oriented. Skin: No rash, no sores, no telangiectasia.  What looks like a bit of rosacea across the nose and cheeks. Nodes: No cervical adenopathy Psych: Pleasant, cooperative, fluid speech.  Calm.  Intake/Output from previous  day: 09/18 0701 - 09/19 0700 In: 534 [IV Piggyback:534] Out: -  Intake/Output this shift: No intake/output data recorded.  LAB RESULTS: Recent Labs    11/03/21 1954 11/04/21 0604  WBC 14.5* 8.5  HGB 9.7* 8.2*  HCT 30.0* 24.3*  PLT 188 156   BMET Lab Results  Component Value Date   NA 141 11/03/2021   NA 140 08/20/2021   NA 142 06/03/2021   K 3.8 11/03/2021   K 4.2 08/20/2021   K 4.4 06/03/2021   CL 107 11/03/2021   CL 107 08/20/2021   CL 106 06/03/2021   CO2 24 11/03/2021   CO2 25 08/20/2021   CO2 25 06/03/2021   GLUCOSE 204 (H) 11/03/2021   GLUCOSE 77 08/20/2021   GLUCOSE 93 06/03/2021   BUN 37 (H) 11/03/2021   BUN 12 08/20/2021   BUN 11 06/03/2021   CREATININE 0.62 11/03/2021   CREATININE 0.66 08/20/2021   CREATININE 0.65 06/03/2021   CALCIUM 9.1 11/03/2021   CALCIUM 9.1 08/20/2021   CALCIUM 9.1 06/03/2021   LFT No results for input(s): "PROT", "ALBUMIN", "AST", "ALT", "ALKPHOS", "BILITOT", "BILIDIR", "IBILI" in the last 72 hours. PT/INR No results found for: "INR", "PROTIME" Hepatitis Panel No results for input(s): "HEPBSAG", "HCVAB", "HEPAIGM", "HEPBIGM" in the last 72 hours. C-Diff No components found for: "CDIFF" Lipase  No results found for: "LIPASE"  Drugs of Abuse  No results found for: "LABOPIA", "COCAINSCRNUR", "LABBENZ", "AMPHETMU", "THCU", "LABBARB"   RADIOLOGY STUDIES: CT ABDOMEN PELVIS W CONTRAST  Result Date: 11/03/2021 CLINICAL DATA:  Rectal bleeding, hematemesis, recent diagnosis of colitis EXAM: CT ABDOMEN AND PELVIS WITH CONTRAST TECHNIQUE: Multidetector CT imaging of the abdomen and pelvis was performed using the standard protocol following bolus administration of intravenous contrast. RADIATION DOSE REDUCTION: This exam was performed according to the departmental dose-optimization program which includes automated exposure control, adjustment of the mA and/or kV according to patient size and/or use of iterative reconstruction  technique. CONTRAST:  19m OMNIPAQUE IOHEXOL 350 MG/ML SOLN COMPARISON:  None Available. FINDINGS: Lower chest: No acute pleural or parenchymal lung disease. Hepatobiliary: Mild hepatic steatosis. No focal liver abnormality. Cholecystectomy. Pancreas: Unremarkable. No pancreatic ductal dilatation or surrounding inflammatory changes. Spleen: Normal in size without focal abnormality. Adrenals/Urinary Tract: Adrenal glands are unremarkable. Kidneys are normal, without renal calculi, focal lesion, or hydronephrosis. Bladder is unremarkable. Stomach/Bowel: No bowel obstruction or ileus. There is mild diffuse colonic wall thickening from the splenic flexure through the rectum, compatible with given history of recent colitis. Normal appendix right lower quadrant. Vascular/Lymphatic: Aortic atherosclerosis. No enlarged abdominal or pelvic lymph nodes. Reproductive: Status post hysterectomy. No adnexal masses. Other: No free fluid or free intraperitoneal gas. No abdominal wall hernia. Musculoskeletal: No acute or destructive bony lesions. Multilevel lumbar spondylosis and facet hypertrophy, with grade 1 degenerative anterolisthesis of L4 on L5. Reconstructed images demonstrate no additional findings. IMPRESSION: 1. Mild distal colonic wall thickening compatible with colitis. 2.  Aortic Atherosclerosis (ICD10-I70.0). Electronically Signed   By: MRanda NgoM.D.   On: 11/03/2021 23:12      IMPRESSION:   Upper GI bleed with coffee-ground emesis and melena.  She has been taking PPI and Pepcid for a long time so chances of ulcers are low.  However she does take Celebrex daily so this could be source of ulcers.  Could have AVMs, Mallory-Weiss tear, neoplasia.  Remote duodenal ulcer in the 1990s.  Normocytic anemia  Chronic Brilinta, aspirin for cardiac stents placed spring 2023.  Last dose was a.m. of 9/18.  Recent courses of antibiotics for sinusitis.  Hypogammaglobulinemia.  Treated with IVIG every  Wednesday.    Hx lymphocytic colitis, well controlled with low-dose budesonide.    PLAN:     Needs EGD, question timing.  Continue Protonix 40iv bid.  As her nausea has subsided I am going to start her on clear liquids.  CBC in AM.      Azucena Freed  11/04/2021, 1:58 PM Phone [phone]

## 2021-11-04 NOTE — Assessment & Plan Note (Signed)
Asymptomatic. 

## 2021-11-04 NOTE — Assessment & Plan Note (Addendum)
Had DES to LAD on 05/19/21.  Had been cleared by Cardiology to hold Brilinta/aspirin for back surgery at 6 months (in October).  Given active bleed and suspected UGI source, we are compelled to hold now. - Hold aspirin and Brilinta - Continue Lipitor

## 2021-11-04 NOTE — Consult Note (Signed)
[facility] Consult: 1:58 PM 11/04/2021  LOS: 0 days    Referring Provider: Dr Loleta Books.    Primary Care Physician:  Isaac Bliss, Rayford Halsted, MD Primary Gastroenterologist:  Dr. Bryan Lemma    Reason for Consultation: Vomiting, rectal bleeding.   HPI: Beverly [name] is a 76 y.o. female.  PMH hypogammaglobulinemia, treated with IVIG infusions every Wednesday OSA.  Pulmonary nocardia infection on chronic Bactrim.  Asthma.  Bronchiectasis.  Lymphocytic colitis on budesonide.  Skin cancer.  Chronic pain/osteoarthritis.  CAD with Pstent x 2 in April 2023 for severe two-vessel disease.  On aspirin/Brilinta.  Hypertension.  Hyperlipidemia.  Hypothyroidism.  Surgeries include reduction of a nasal fracture, brow lift, blepharoplasty, shoulder arthroscopy, carpal tunnel release, lumbar spine fusion.  05/2020 colonoscopy.  For evaluation of diarrhea.  The entire colonic mucosa appeared grossly normal biopsies obtained throughout.  2 mm, sessile polyp at distal rectum cold biopsied.  Nonbleeding internal hemorrhoids.  Excessive looping at the splenic flexure, ascending colon required abdominal pressure to advance scope. Pathology confirmed lymphocytic colitis.  GERD. On colonoscopies prior to this there was mild sigmoid diverticulosis noted. Had an EGD in the 1990s and had ulcers but she does not recall whether these were bleeding ulcers or the circumstances for the ED  Patient normally takes PPI once daily and Pepcid at night for reflux.  In the last few weeks she has had a few courses of antibiotics for sinusitis.  At the latest prescription for antibiotics the MD had her increase her PPI to twice daily.  This improved her reflux symptoms.  She also takes Celebrex daily for arthritis.  Yesterday morning she had weakness,  fatigue and slept a lot during the morning.  She got up a couple of times to have a bowel movement which were diarrheal but she did not visualize the color of these.  At about 4:30 PM she got up and she felt even more weak.  She went to the bathroom and vomited dark material and had a dark, incontinent stool.  Since that time she has not had any further vomiting or bowel movements.  Although she felt very weak and had to crawl back to bed after the episode of dark emesis, she never fainted.  There was no shortness of breath or chest pressure.  She got some Zofran this morning.  At baseline regarding her lymphocytic colitis, she is down to taking 1 budesonide daily and has not had any diarrhea until the events yesterday  Came to the ED last night.  Started on Protonix 40 IV every 12 hours. No hypotension.  No tachycardia.  Excellent room air sats.  HGB 9.7... 8.2.  In April she had a low of 10.9.  Highest Hb was 13.8 in late June, 12.6 in early July. FOBT positive BUN is 37, previously well within normal limits in the past several months.  Creatinine is normal.      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   right sholder   BCC (basal cell carcinoma of skin) 05/09/2014   mid lower back   BCC (basal cell carcinoma of skin) 05/03/2017   right low back   BCC (basal cell carcinoma of skin) 05/03/2017   left upper back   BCC (basal cell carcinoma of skin) 07/05/2018   left mid back   BCC (basal cell carcinoma of skin) 05/20/1992   upper back   BCC (basal cell carcinoma of skin) 07/29/1993   left sholder medial   BCC (basal cell carcinoma of skin) 07/29/1993   left sholder lateral   BCC (basal cell carcinoma of skin) 07/29/1993   right thigh    BCC (basal cell carcinoma of skin) 07/29/1993   right sholder   BCC (basal cell carcinoma of skin) 12/22/1994   right mid forearm   BCC (basal cell carcinoma of skin) 12/22/1994   right upper forearm   BCC (basal cell carcinoma of skin) 12/22/1994   lower right upper arm   BCC (basal cell carcinoma of skin) 12/22/1994   right upper arm sholder   BCC (basal cell carcinoma of skin) 08/11/1995   left leg below knee   BCC (basal cell carcinoma of skin) 04/11/2002   mid back   BCC (basal cell carcinoma of skin) 12/10/2002   right center upper back   BCC (basal cell carcinoma of skin) 05/26/2005   right post sholder   BCC (basal cell carcinoma) 05/09/2014   left inner shin   BCC (basal cell carcinoma) 06/12/2014   left forearm   Bowen's disease 10/07/1994   right post knee, right inner forearm/wrist   Bowen's disease 08/11/1995   left sholder   Bronchiectasis ([place])    Cataract    left   Chronic pain    Common variable immunodeficiency [facility])    sees dr Gayleen Orem 02-11-2021 epic   Coronary artery disease    Depression    hx of   Diverticulosis of colon 1998   mild   DJD (degenerative joint disease)    Duodenal ulcer    h/o yrs ago   GERD (gastroesophageal reflux disease)    History of SCC (squamous cell carcinoma) of skin    Dr. Denna Haggard   History of sinus bradycardia    HLD (hyperlipidemia)    hypertriglyceridemia   Hypertensive retinopathy of both eyes 11/18/2017   Hypothyroidism    IBS (irritable bowel syndrome)    Internal hemorrhoids 1998   Lymphocytic colitis    Mitral regurgitation    mild   Nocardiosis    relased by infection disease dec 2022   Osteoarthritis    feet,shoulder,neck,back,hips and hands.   Pneumonia 2015   PONV (postoperative nausea and vomiting)    Rotator cuff tear, right 02/2019   infraspinatus and supraspinatus, and dislocation of long head of bicep tendon (Dr. Alvan Dame)   SCC (squamous cell carcinoma) 11/26/2014   left hand, right hand,  right deltoid mnay areas   SCC (squamous cell carcinoma) 05/03/2017   left cheek   Sleep apnea    uses a mouth guard nightly   Squamous cell carcinoma in situ (SCCIS) 07/05/2018   left hand   Tracheobronchomalacia    Vitamin D deficiency    mild    Past Surgical History:  Procedure  Laterality Date   ABDOMINAL HYSTERECTOMY  1998   complete   BLEPHAROPLASTY Bilateral 01/2018   BROW LIFT Bilateral 10/10/2018   Procedure: BROW LIFT;  Surgeon: Wallace Going, DO;  Location: [place];  Service: Plastics;  Laterality: Bilateral;   BUNIONECTOMY     R 12/08, L 2004 (Dr. Janus Molder)   [place]   bilateral   CARPAL TUNNEL RELEASE Left 03/06/2021   Procedure: Left Revision Carpal Tunnel Release with hypothenar fat pad flap;  Surgeon: [name], MD;  Location: [place];  Service: Orthopedics;  Laterality: Left;  with local anesthesia   CATARACT EXTRACTION Bilateral Left in 08/2012, Right 04/2016   Dr.Hecker   CESAREAN SECTION     1981 and [place]   laparoscopic   CLOSED REDUCTION NASAL FRACTURE N/A 08/29/2018   Procedure: CLOSED REDUCTION NASAL FRACTURE;  Surgeon: Wallace Going, DO;  Location: [place];  Service: Plastics;  Laterality: N/A;  1 hour, please   COLONOSCOPY  2006   COLONOSCOPY  01/2012   due again 01/2021; mild diverticulosis   CORONARY STENT INTERVENTION N/A 05/19/2021   Procedure: CORONARY STENT INTERVENTION;  Surgeon: Nelva Bush, MD;  Location: [place] [facility];  Service: Cardiovascular;  Laterality: N/A;   epidural steroid injection, back  02/2010   HIP SURGERY     right bursectomy x 3   HIP SURGERY Right 2000   torn cartilage, repaired   INGUINAL HERNIA REPAIR  10/2007   bilat   INTRAVASCULAR ULTRASOUND/IVUS N/A 05/19/2021   Procedure: Intravascular Ultrasound/IVUS;  Surgeon: Nelva Bush, MD;  Location: [place] [facility];  Service: Cardiovascular;  Laterality: N/A;   LEFT HEART CATH AND CORONARY ANGIOGRAPHY N/A 05/19/2021   Procedure: LEFT HEART CATH AND CORONARY ANGIOGRAPHY;  Surgeon: Nelva Bush, MD;  Location: [place] [facility];  Service: Cardiovascular;  Laterality: N/A;   NECK SURGERY  1989   c6-7 cervical laminectomy and diskecotmy   REVERSE SHOULDER ARTHROPLASTY Right 04/11/2019   Procedure: REVERSE SHOULDER ARTHROPLASTY;  Surgeon: Nicholes Stairs, MD;  Location: [place];  Service: Orthopedics;  Laterality: Right;  [place]   SHOULDER SURGERY Left 03/2003   left rotator cuff repair   SHOULDER SURGERY Left 03/14/2018   rotator cuff repair; Dr. Tonita Cong   TONSILLECTOMY  age 72   TROCHANTERIC [place] EXTRACTION     as teenager    Prior to Admission medications   Medication Sig Start Date End Date Taking? Authorizing Provider  acetaminophen (TYLENOL) 500 MG tablet Take 1,000 mg by mouth every 6 (six) hours as needed for moderate pain.    Yes [provider]  albuterol (VENTOLIN HFA) 108 (90 Base) MCG/ACT inhaler Inhale 2 puffs into the lungs every 6 (six) hours as needed for wheezing or shortness of breath. 08/12/21  Yes Kozlow, Donnamarie Poag, MD  aspirin EC 81 MG tablet Take 81 mg by mouth daily.   Yes [provider]  atorvastatin (LIPITOR) 40 MG tablet Take 1 tablet by mouth daily. 07/23/21 07/23/22 Yes Erline Hau, MD  budesonide (ENTOCORT EC) 3 MG 24 hr capsule Take 3 capsules (9 mg total) by mouth daily for 28 days, THEN 2 capsules (6 mg total) daily for 56 days, THEN 1 capsule (3 mg total) daily. 08/05/21 12/30/21 Yes Cirigliano, Dominic Pea, DO  Buprenorphine HCl (BELBUCA) 750 MCG FILM Place 1 film to inside of mouth every 12 hours 09/12/21  Yes   Calcium Carbonate-Vitamin D 600-200 MG-UNIT TABS Take 1 tablet by mouth 2 (two) times daily.   Yes [provider]  celecoxib (CELEBREX) 200 MG capsule Take  1 capsule (200 mg total) by mouth 2 (two) times daily. 10/23/21  Yes Isaac Bliss, Rayford Halsted, MD  colestipol (COLESTID) 1 g tablet Take 1 tablet by mouth daily. 10/21/21  Yes Kozlow, Donnamarie Poag, MD  diclofenac Sodium (VOLTAREN) 1 % GEL Apply 2 g topically 4 (four) times daily as needed (joint pain). 12/29/18  Yes [provider]  Docusate Sodium 100 MG capsule Take 100 mg by mouth daily. 09/27/20  Yes [provider]  famotidine (PEPCID) 40 MG tablet Take 1 tablet by mouth at bedtime. 08/12/21  Yes Kozlow, Donnamarie Poag, MD  Fluorouracil (TOLAK) 4 % CREA Apply to aa total 28 applications & sun protect 04/07/21  Yes Lavonna Monarch, MD  gabapentin (NEURONTIN) 800 MG tablet Take 1 tablet by mouth at bedtime. Patient taking differently: Take 800 mg by mouth at bedtime. 10/29/21  Yes   Guaifenesin 1200 MG TB12 Take 1,200 mg by mouth 2 (two) times daily.   Yes [provider]  HYDROcodone-acetaminophen (NORCO) 7.5-325 MG tablet Take 1 tablet by mouth four times a day as needed for pain 06/20/21  Yes   ipratropium (ATROVENT) 0.03 % nasal spray Place 1 spray into both nostrils 3 (three) times daily. 08/12/21  Yes Kozlow, Donnamarie Poag, MD  Lactobacillus-Inulin (CULTURELLE ADULT ULT BALANCE) CAPS Take 1 capsule by mouth daily.   Yes [provider]  levothyroxine (SYNTHROID) 25 MCG tablet Take 1 tablet (25 mcg total) by mouth daily. 05/08/21  Yes Isaac Bliss, Rayford Halsted, MD  loratadine (CLARITIN) 10 MG tablet Take 1 tablet (10 mg total) by mouth daily as needed for allergies (Can take an extra dose during flare ups.). 08/12/21  Yes Kozlow, Donnamarie Poag, MD  Melatonin-Pyridoxine (MELATIN PO) Take 5 mg by mouth at bedtime. 02/13/20  Yes [provider]  methocarbamol (ROBAXIN) 500 MG tablet Take 1 tablet by mouth three times a day as needed Patient taking differently: Take 500 mg by mouth 2 (two) times daily. 06/20/21  Yes   montelukast (SINGULAIR) 10 MG tablet Take 1 tablet (10 mg total) by mouth  at bedtime. Patient taking differently: Take 10 mg by mouth daily. 08/12/21  Yes Kozlow, Donnamarie Poag, MD  Multiple Vitamins-Minerals (MULTIVITAMIN WITH MINERALS) tablet Take 1 tablet by mouth daily.   Yes [provider]  nitroGLYCERIN (NITROSTAT) 0.4 MG SL tablet Place 1 tablet (0.4 mg total) under the tongue every 5 (five) minutes as needed. 05/20/21  Yes Reino Bellis B, NP  omega-3 acid ethyl esters (LOVAZA) 1 g capsule TAKE 2 CAPSULES BY MOUTH 2 TIMES DAILY Patient taking differently: Take 2 g by mouth 2 (two) times daily. 10/15/21 10/15/22 Yes Erline Hau, MD  ondansetron (ZOFRAN-ODT) 4 MG disintegrating tablet Take 1 tablet by mouth every 6 (six) hours as needed for nausea or vomiting. 03/25/21  Yes Cirigliano, Vito V, DO  pantoprazole (PROTONIX) 40 MG tablet Take 1 tablet (40 mg total) by mouth daily with breakfast. 08/07/21  Yes Josue [name], MD  PARoxetine (PAXIL) 30 MG tablet Take 1 tablet by mouth once a day. Patient taking differently: Take 30 mg by mouth at bedtime. 06/12/21  Yes   ticagrelor (BRILINTA) 90 MG TABS tablet Take 1  tablet by mouth 2 times daily. (stop Plavix (clopidogrel)) 06/16/21  Yes Imogene Burn, PA-C  triamcinolone (NASACORT) 55 MCG/ACT AERO nasal inhaler Place 2 sprays into each nostril once daily. 10/10/21  Yes Kozlow, Donnamarie Poag, MD  Vitamin D, Ergocalciferol, (DRISDOL) 1.25 MG (50000 UNIT) CAPS capsule Take 1 capsule (50,000 Units total) by mouth every 7 (seven) days for 12 doses. 08/12/21 11/04/21 Yes Erline Hau, MD  XEMBIFY 10 GM/50ML SOLN Inject 10 g into the skin once a week. Wednesday 07/21/21  Yes [provider]  EPINEPHrine 0.3 mg/0.3 mL IJ SOAJ injection Inject 0.3 mg into the muscle as needed for anaphylaxis. Patient not taking: Reported on 11/04/2021 01/21/21   [provider]  potassium chloride (KLOR-CON) 10 MEQ tablet Take 2 tablets (total 20 MEQ) by mouth once 03/29/20 04/16/20  Rosiland Oz, MD     Scheduled Meds:  atorvastatin  40 mg Oral Daily   gabapentin  800 mg Oral QHS   [START ON 11/05/2021] Immune Globulin (Human)-klhw  10 g Subcutaneous Weekly   ipratropium  1 spray Each Nare TID   [START ON 11/05/2021] levothyroxine  25 mcg Oral Daily   methocarbamol  500 mg Oral BID   montelukast  10 mg Oral Daily   pantoprazole (PROTONIX) IV  40 mg Intravenous Q12H   PARoxetine  30 mg Oral Daily   triamcinolone  2 spray Nasal Daily   Infusions:  sodium chloride 125 mL/hr (11/04/21 0551)   PRN Meds: HYDROcodone-acetaminophen, loratadine   Allergies as of 11/03/2021 - Review Complete 10/27/2021  Allergen Reaction Noted   Plavix [clopidogrel] Rash 06/16/2021   Adhesive [tape] Rash 10/08/2010   Codeine Rash 10/08/2010    Family History  Problem Relation Age of Onset   Depression Mother    Schizophrenia Mother    Cancer Father        oral   Heart disease Father        bradycardia   Esophageal cancer Father    Cancer Sister        skin and lung   COPD Sister    Hypertension Sister    Osteoporosis Sister        compression fx's x 3 11/2018   Hyperthyroidism Brother    Cancer Brother        metastatic cancer to bone (?primary)   Diabetes Maternal Grandmother    Cancer Paternal Grandmother 88       colon cancer, metastatic to liver   Colon cancer Paternal Grandmother    Liver cancer Paternal Grandmother    Hyperlipidemia Daughter    Asthma Son    ADD / ADHD Other     Social History   Socioeconomic History   Marital status: Divorced    Spouse name: Not on file   Number of children: 2   Years of education: Not on file   Highest education level: Master's degree (e.g., MA, MS, MEng, MEd, MSW, MBA)  Occupational History   Occupation: STAFF Optician, dispensing: [employer]   Occupation: Retired  Tobacco Use   Smoking status: Never   Smokeless tobacco: Never  Vaping Use   Vaping Use: Never used  Substance and Sexual Activity   Alcohol use: Yes     Alcohol/week: 7.0 standard drinks of alcohol    Types: 7 Glasses of wine per week    Comment: 1 glass wine per day   Drug use: No   Sexual activity: Not Currently  Other  Topics Concern   Not on file  Social History Narrative   Divorced, lives alone, [place]; 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   [place] for [employer] part-time   Social Determinants of Health   Financial Resource Strain: [place]  (05/17/2021)   Overall Financial Resource Strain (CARDIA)    Difficulty of Paying Living Expenses: Not hard at all  Food Insecurity: No [place] (05/17/2021)   Hunger Vital Sign    Worried About Running Out of Food in the Last Year: Never true    [place] in the Last Year: Never true  Transportation Needs: No Transportation Needs (05/17/2021)   PRAPARE - Hydrologist (Medical): No    Lack of Transportation (Non-Medical): No  Physical Activity: Insufficiently Active (05/17/2021)   Exercise Vital Sign    Days of Exercise per Week: 1 day    Minutes of Exercise per Session: 10 min  Stress: No Stress Concern Present (05/17/2021)   [place]    Feeling of Stress : Not at all  Social Connections: Moderately Integrated (05/17/2021)   Social Connection and Isolation Panel [NHANES]    Frequency of Communication with Friends and Family: More than three times a week    Frequency of Social Gatherings with Friends and Family: More than three times a week    Attends Religious Services: More than 4 times per year    Active Member of [employer] or Organizations: Yes    Attends Archivist Meetings: More than 4 times per year    Marital Status: Divorced  Intimate Partner Violence: Not At Risk (02/04/2021)   Humiliation, Afraid, Rape, and Kick questionnaire    Fear of  Current or Ex-Partner: No    Emotionally Abused: No    Physically Abused: No    Sexually Abused: No    REVIEW OF SYSTEMS: Constitutional: Weakness. ENT:  No nose bleeds Pulm: No shortness of breath CV:  No palpitations, no LE edema.  No chest pressure/pain GU:  No hematuria, no frequency GI: Per HPI. Heme: Since starting Brilinta she bruises easily but has not had any excessive bleeding. Transfusions: No transfusions before. Neuro:  No headaches, no peripheral tingling or numbness Derm:  No itching, no rash or sores.  Endocrine:  No sweats or chills.  No polyuria or dysuria Immunization: Not queried Travel: Not queried   PHYSICAL EXAM: Vital signs in last 24 hours: Vitals:   11/04/21 0815 11/04/21 1100  BP:  (!) 110/47  Pulse:  67  Resp:  12  Temp: 99.5 F (37.5 C)   SpO2:  96%   Wt Readings from Last 3 Encounters:  10/27/21 64.1 kg  10/21/21 64.8 kg  10/17/21 66.2 kg    General: Pleasant, looks a bit pale but not acutely ill. Head: No facial asymmetry or swelling.  No signs of head trauma. Eyes: No conjunctival pallor or scleral icterus Ears: No hearing deficit Nose: Congested but no discharge Mouth: Tongue midline.  Good dentition.  Mucosa moist, pink, clear. Neck: No JVD Lungs: Clear bilaterally without labored breathing or cough Heart: RRR.  No MRG.  S1, S2 present Abdomen: Soft.  Some tenderness across the upper abdomen without guarding or rebound.  Patient notes that she only has this discomfort with exam.  No HSM, masses, bruits, hernias Rectal: Deferred Musc/Skeltl: No  joint redness, swelling or gross deformity. Extremities: No CCE. Neurologic: Alert.  Good historian.  Moves all 4 limbs without weakness.  No tremors.  Fully oriented. Skin: No rash, no sores, no telangiectasia.  What looks like a bit of rosacea across the nose and cheeks. Nodes: No cervical adenopathy Psych: Pleasant, cooperative, fluid speech.  Calm.  Intake/Output from previous  day: 09/18 0701 - 09/19 0700 In: 534 [IV Piggyback:534] Out: -  Intake/Output this shift: No intake/output data recorded.  LAB RESULTS: Recent Labs    11/03/21 1954 11/04/21 0604  WBC 14.5* 8.5  HGB 9.7* 8.2*  HCT 30.0* 24.3*  PLT 188 156   BMET Lab Results  Component Value Date   NA 141 11/03/2021   NA 140 08/20/2021   NA 142 06/03/2021   K 3.8 11/03/2021   K 4.2 08/20/2021   K 4.4 06/03/2021   CL 107 11/03/2021   CL 107 08/20/2021   CL 106 06/03/2021   CO2 24 11/03/2021   CO2 25 08/20/2021   CO2 25 06/03/2021   GLUCOSE 204 (H) 11/03/2021   GLUCOSE 77 08/20/2021   GLUCOSE 93 06/03/2021   BUN 37 (H) 11/03/2021   BUN 12 08/20/2021   BUN 11 06/03/2021   CREATININE 0.62 11/03/2021   CREATININE 0.66 08/20/2021   CREATININE 0.65 06/03/2021   CALCIUM 9.1 11/03/2021   CALCIUM 9.1 08/20/2021   CALCIUM 9.1 06/03/2021   LFT No results for input(s): "PROT", "ALBUMIN", "AST", "ALT", "ALKPHOS", "BILITOT", "BILIDIR", "IBILI" in the last 72 hours. PT/INR No results found for: "INR", "PROTIME" Hepatitis Panel No results for input(s): "HEPBSAG", "HCVAB", "HEPAIGM", "HEPBIGM" in the last 72 hours. C-Diff No components found for: "CDIFF" Lipase  No results found for: "LIPASE"  Drugs of Abuse  No results found for: "LABOPIA", "COCAINSCRNUR", "LABBENZ", "AMPHETMU", "THCU", "LABBARB"   RADIOLOGY STUDIES: CT ABDOMEN PELVIS W CONTRAST  Result Date: 11/03/2021 CLINICAL DATA:  Rectal bleeding, hematemesis, recent diagnosis of colitis EXAM: CT ABDOMEN AND PELVIS WITH CONTRAST TECHNIQUE: Multidetector CT imaging of the abdomen and pelvis was performed using the standard protocol following bolus administration of intravenous contrast. RADIATION DOSE REDUCTION: This exam was performed according to the departmental dose-optimization program which includes automated exposure control, adjustment of the mA and/or kV according to patient size and/or use of iterative reconstruction  technique. CONTRAST:  24m OMNIPAQUE IOHEXOL 350 MG/ML SOLN COMPARISON:  None Available. FINDINGS: Lower chest: No acute pleural or parenchymal lung disease. Hepatobiliary: Mild hepatic steatosis. No focal liver abnormality. Cholecystectomy. Pancreas: Unremarkable. No pancreatic ductal dilatation or surrounding inflammatory changes. Spleen: Normal in size without focal abnormality. Adrenals/Urinary Tract: Adrenal glands are unremarkable. Kidneys are normal, without renal calculi, focal lesion, or hydronephrosis. Bladder is unremarkable. Stomach/Bowel: No bowel obstruction or ileus. There is mild diffuse colonic wall thickening from the splenic flexure through the rectum, compatible with given history of recent colitis. Normal appendix right lower quadrant. Vascular/Lymphatic: Aortic atherosclerosis. No enlarged abdominal or pelvic lymph nodes. Reproductive: Status post hysterectomy. No adnexal masses. Other: No free fluid or free intraperitoneal gas. No abdominal wall hernia. Musculoskeletal: No acute or destructive bony lesions. Multilevel lumbar spondylosis and facet hypertrophy, with grade 1 degenerative anterolisthesis of L4 on L5. Reconstructed images demonstrate no additional findings. IMPRESSION: 1. Mild distal colonic wall thickening compatible with colitis. 2.  Aortic Atherosclerosis (ICD10-I70.0). Electronically Signed   By: MRanda NgoM.D.   On: 11/03/2021 23:12      IMPRESSION:   Upper GI bleed with coffee-ground emesis and melena.  She has been taking PPI and Pepcid for a long time so chances of ulcers are low.  However she does take Celebrex daily so this could be source of ulcers.  Could have AVMs, Mallory-Weiss tear, neoplasia.  Remote duodenal ulcer in the 1990s.  Normocytic anemia  Chronic Brilinta, aspirin for cardiac stents placed spring 2023.  Last dose was a.m. of 9/18.  Recent courses of antibiotics for sinusitis.  Hypogammaglobulinemia.  Treated with IVIG every  Wednesday.    Hx lymphocytic colitis, well controlled with low-dose budesonide.    PLAN:     Needs EGD, question timing.  Continue Protonix 40iv bid.  As her nausea has subsided I am going to start her on clear liquids.  CBC in AM.      Azucena Freed  11/04/2021, 1:58 PM Phone [phone]

## 2021-11-04 NOTE — Progress Notes (Signed)
  Progress Note   Patient: Beverly [name] ZOX:[id number] DOB: Aug 26, 1945 DOA: 11/03/2021     0 DOS: the patient was seen and examined on 11/04/2021 at 11:03AM      Brief hospital course: Mrs. Derenzo is a 76 y.o. F with CVID, CAD s/p DES in Apr 2023, ILD with severe tracheobronchomalacia, hx SVT, bronchiectasis, lymphocytic colitis, and hypothyroidism who presented with hematemesis and hematochezia for 1 day.    In the ER, Hgb 9/7 from [place] 12.6 in July.   CT abdomen pelvis showing mild distal colonic wall thickening compatible with colitis.  She takes aspirin, Brilinta, and celecoxib at home.       Assessment and Plan: * GI bleed Patient with coffee-ground emesis, "clots" in her stools prior to admission.  In the setting of ASA/Brilinta for recent stent and Celebrex daily for chronic low back pain.  Hemodynamically stable and no further clinical bleeding observed. -Continue IV PPI - Consult GI, appreciate expertise - Trend hemoglobin    Normocytic anemia Hemoglobin 9.7 on admission, down to 8.2 today -Transfusion threshold 7 g/dL  Hyperglycemia No prior hx DM.  Glucose >200 on admission - Check A1c  Tracheobronchomalacia Asymptomatic.  CAD (coronary artery disease) Had DES to LAD on 05/19/21.  Had been cleared by Cardiology to hold Brilinta/aspirin for back surgery at 6 months (in October).  Given active bleed and suspected UGI source, we are compelled to hold now. - Hold aspirin and Brilinta - Continue Lipitor  Lymphocytic colitis - Consult GI, appreciate cares  Common variable immunodeficiency (CVID) - Continue IVIG  Hypothyroidism - Continue levothyroxine  Interstitial lung disease ([place]) Not on home O2, stable here.  Chronic back pain - Hold Celebrex - Continue Paxil, Robaxin, Norco, gabapentin          Subjective: No further vomiting, no melena or hematochezia overnight.  No confusion, dizziness, syncope.     Physical Exam: BP (!)  107/58   Pulse 94   Temp 99.5 F (37.5 C) (Oral)   Resp 19   SpO2 100%   Thin adult female, lying in bed, no acute distress, appears tired Tachycardic, regular, no murmurs, no peripheral edema Respiratory rate normal, lungs clear without rales or wheezes Mild lower abdominal tenderness to palpation, voluntary guarding noted, no rigidity, no rebound Tension normal, affect appropriate, judgment insight appear normal, face symmetric, speech      Family Communication: Daughter by phone    Disposition: Status is: Inpatient This is a no charge note, for further details please see the H&P, partner Dr. Royce Macadamia from earlier today.  Patient was admitted with GI bleeding, likely upper in the setting of recent stent use.  She will need evaluation by gastroenterology, prior to resumption of her Brilinta, dendsocopy likely to be delayed to allow Brilinta washout        Author: Edwin Dada, MD 11/04/2021 2:54 PM  For on call review [email or web address].

## 2021-11-05 ENCOUNTER — Encounter (HOSPITAL_COMMUNITY)
Admission: EM | Disposition: A | Discharge status: Home / Self Care | Payer: Self-pay | Source: Home / Self Care | Attending: Internal Medicine

## 2021-11-05 ENCOUNTER — Encounter (HOSPITAL_COMMUNITY): Payer: Self-pay | Admitting: Family Medicine

## 2021-11-05 ENCOUNTER — Inpatient Hospital Stay (HOSPITAL_COMMUNITY): Payer: Medicare PPO | Admitting: Anesthesiology

## 2021-11-05 ENCOUNTER — Telehealth: Payer: Self-pay

## 2021-11-05 DIAGNOSIS — K259 Gastric ulcer, unspecified as acute or chronic, without hemorrhage or perforation: Secondary | ICD-10-CM | POA: Diagnosis not present

## 2021-11-05 DIAGNOSIS — K2100 Gastro-esophageal reflux disease with esophagitis, without bleeding: Secondary | ICD-10-CM

## 2021-11-05 DIAGNOSIS — G8929 Other chronic pain: Secondary | ICD-10-CM

## 2021-11-05 DIAGNOSIS — M549 Dorsalgia, unspecified: Secondary | ICD-10-CM

## 2021-11-05 DIAGNOSIS — D839 Common variable immunodeficiency, unspecified: Secondary | ICD-10-CM | POA: Diagnosis not present

## 2021-11-05 DIAGNOSIS — K254 Chronic or unspecified gastric ulcer with hemorrhage: Secondary | ICD-10-CM | POA: Diagnosis not present

## 2021-11-05 DIAGNOSIS — K250 Acute gastric ulcer with hemorrhage: Secondary | ICD-10-CM

## 2021-11-05 DIAGNOSIS — I2510 Atherosclerotic heart disease of native coronary artery without angina pectoris: Secondary | ICD-10-CM

## 2021-11-05 DIAGNOSIS — J849 Interstitial pulmonary disease, unspecified: Secondary | ICD-10-CM

## 2021-11-05 DIAGNOSIS — D62 Acute posthemorrhagic anemia: Secondary | ICD-10-CM

## 2021-11-05 DIAGNOSIS — K319 Disease of stomach and duodenum, unspecified: Secondary | ICD-10-CM | POA: Diagnosis not present

## 2021-11-05 DIAGNOSIS — K5710 Diverticulosis of small intestine without perforation or abscess without bleeding: Secondary | ICD-10-CM | POA: Diagnosis not present

## 2021-11-05 DIAGNOSIS — K922 Gastrointestinal hemorrhage, unspecified: Secondary | ICD-10-CM | POA: Diagnosis not present

## 2021-11-05 DIAGNOSIS — Z8711 Personal history of peptic ulcer disease: Secondary | ICD-10-CM

## 2021-11-05 HISTORY — PX: BIOPSY: SHX5522

## 2021-11-05 HISTORY — PX: ESOPHAGOGASTRODUODENOSCOPY (EGD) WITH PROPOFOL: SHX5813

## 2021-11-05 LAB — CBC
HCT: 21.3 % — ABNORMAL LOW (ref 36.0–46.0)
Hemoglobin: 7 g/dL — ABNORMAL LOW (ref 12.0–15.0)
MCH: 33 pg (ref 26.0–34.0)
MCHC: 32.9 g/dL (ref 30.0–36.0)
MCV: 100.5 fL — ABNORMAL HIGH (ref 80.0–100.0)
Platelets: 142 10*3/uL — ABNORMAL LOW (ref 150–400)
RBC: 2.12 MIL/uL — ABNORMAL LOW (ref 3.87–5.11)
RDW: 15.2 % (ref 11.5–15.5)
WBC: 6.9 10*3/uL (ref 4.0–10.5)
nRBC: 0 % (ref 0.0–0.2)

## 2021-11-05 LAB — HEMOGLOBIN A1C
Hgb A1c MFr Bld: 4.9 % (ref 4.8–5.6)
Mean Plasma Glucose: 93.93 mg/dL

## 2021-11-05 LAB — BASIC METABOLIC PANEL
Anion gap: 7 (ref 5–15)
BUN: 12 mg/dL (ref 8–23)
CO2: 23 mmol/L (ref 22–32)
Calcium: 8.5 mg/dL — ABNORMAL LOW (ref 8.9–10.3)
Chloride: 113 mmol/L — ABNORMAL HIGH (ref 98–111)
Creatinine, Ser: 0.6 mg/dL (ref 0.44–1.00)
GFR, Estimated: >60 mL/min (ref >60)
Glucose, Bld: 92 mg/dL (ref 70–99)
Potassium: 3.3 mmol/L — ABNORMAL LOW (ref 3.5–5.1)
Sodium: 143 mmol/L (ref 135–145)

## 2021-11-05 LAB — POCT I-STAT, CHEM 8
BUN: 11 mg/dL (ref 8–23)
Calcium, Ion: 1.26 mmol/L (ref 1.15–1.40)
Chloride: 107 mmol/L (ref 98–111)
Creatinine, Ser: 0.4 mg/dL — ABNORMAL LOW (ref 0.44–1.00)
Glucose, Bld: 91 mg/dL (ref 70–99)
HCT: 21 % — ABNORMAL LOW (ref 36.0–46.0)
Hemoglobin: 7.1 g/dL — ABNORMAL LOW (ref 12.0–15.0)
Potassium: 3.7 mmol/L (ref 3.5–5.1)
Sodium: 142 mmol/L (ref 135–145)
TCO2: 22 mmol/L (ref 22–32)

## 2021-11-05 LAB — PREPARE RBC (CROSSMATCH)

## 2021-11-05 LAB — HEMOGLOBIN AND HEMATOCRIT, BLOOD
HCT: 32.9 % — ABNORMAL LOW (ref 36.0–46.0)
Hemoglobin: 11 g/dL — ABNORMAL LOW (ref 12.0–15.0)

## 2021-11-05 SURGERY — ESOPHAGOGASTRODUODENOSCOPY (EGD) WITH PROPOFOL
Anesthesia: Monitor Anesthesia Care

## 2021-11-05 MED ORDER — POTASSIUM CHLORIDE CRYS ER 20 MEQ PO TBCR
40.0000 meq | EXTENDED_RELEASE_TABLET | Freq: Once | ORAL | Status: AC
  Administered 2021-11-05: 40 meq via ORAL
  Filled 2021-11-05: qty 2

## 2021-11-05 MED ORDER — ALBUMIN HUMAN 5 % IV SOLN
INTRAVENOUS | Status: AC
  Filled 2021-11-05: qty 250

## 2021-11-05 MED ORDER — PROPOFOL 500 MG/50ML IV EMUL
INTRAVENOUS | Status: DC | PRN
  Administered 2021-11-05: 125 ug/kg/min via INTRAVENOUS

## 2021-11-05 MED ORDER — PROPOFOL 10 MG/ML IV BOLUS
INTRAVENOUS | Status: DC | PRN
  Administered 2021-11-05: 50 mg via INTRAVENOUS

## 2021-11-05 MED ORDER — CLOPIDOGREL BISULFATE 75 MG PO TABS
75.0000 mg | ORAL_TABLET | Freq: Every day | ORAL | Status: DC
  Administered 2021-11-06: 75 mg via ORAL
  Filled 2021-11-05: qty 1

## 2021-11-05 MED ORDER — LIDOCAINE 2% (20 MG/ML) 5 ML SYRINGE
INTRAMUSCULAR | Status: DC | PRN
  Administered 2021-11-05: 60 mg via INTRAVENOUS

## 2021-11-05 MED ORDER — LACTATED RINGERS IV SOLN: INTRAVENOUS | Status: DC | PRN

## 2021-11-05 MED ORDER — ALBUMIN HUMAN 5 % IV SOLN
12.5000 g | Freq: Once | INTRAVENOUS | Status: AC
  Administered 2021-11-05: 12.5 g via INTRAVENOUS

## 2021-11-05 MED ORDER — BUPRENORPHINE HCL 750 MCG BU FILM
750.0000 ug | ORAL_FILM | Freq: Two times a day (BID) | BUCCAL | Status: DC
  Filled 2021-11-05 (×3): qty 1

## 2021-11-05 MED ORDER — SODIUM CHLORIDE 0.9% IV SOLUTION: Freq: Once | INTRAVENOUS | Status: DC

## 2021-11-05 MED ORDER — SODIUM CHLORIDE 0.9% IV SOLUTION: Freq: Once | INTRAVENOUS | Status: AC

## 2021-11-05 MED ORDER — TRIAMCINOLONE ACETONIDE 55 MCG/ACT NA AERO
2.0000 | INHALATION_SPRAY | Freq: Every day | NASAL | Status: DC
  Administered 2021-11-05: 2 via NASAL
  Filled 2021-11-05: qty 21.6

## 2021-11-05 SURGICAL SUPPLY — 15 items

## 2021-11-05 NOTE — Anesthesia Procedure Notes (Signed)
Procedure Name: MAC Date/Time: 11/05/2021 1:54 PM  Performed by: Lowella Dell, CRNAPre-anesthesia Checklist: Patient identified, Emergency Drugs available, Suction available, Patient being monitored and Timeout performed Patient Re-evaluated:Patient Re-evaluated prior to induction Oxygen Delivery Method: Nasal cannula Placement Confirmation: positive ETCO2 Dental Injury: Teeth and Oropharynx as per pre-operative assessment

## 2021-11-05 NOTE — Interval H&P Note (Signed)
History and Physical Interval Note:  11/05/2021 1:25 PM  Beverly [name]  has presented today for surgery, with the diagnosis of Upper endoscopy.  The various methods of treatment have been discussed with the patient and family. After consideration of risks, benefits and other options for treatment, the patient has consented to  Procedure(s): ESOPHAGOGASTRODUODENOSCOPY (EGD) WITH PROPOFOL (N/A) as a surgical intervention.  The patient's history has been reviewed, patient examined, no change in status, stable for surgery.  I have reviewed the patient's chart and labs.  Questions were answered to the patient's satisfaction.     Thornton Park

## 2021-11-05 NOTE — Anesthesia Preprocedure Evaluation (Addendum)
Anesthesia Evaluation  Patient identified by MRN, date of birth, ID band Patient awake    Reviewed: Allergy & Precautions, NPO status , Patient's Chart, lab work & pertinent test results  History of Anesthesia Complications (+) PONV and history of anesthetic complications  Airway Mallampati: II  TM Distance: >3 FB Neck ROM: Full    Dental  (+) Dental Advisory Given   Pulmonary sleep apnea (does not use CPAP) , COPD,  COPD inhaler,  ILD with severe tracheobronchomalacia   breath sounds clear to auscultation       Cardiovascular (-) angina+ CAD and + Cardiac Stents   Rhythm:Regular Rate:Normal  05/2021 Cath:  1. Severe two-vessel coronary artery disease with up to 90% stenoses involving the ostial through mid LAD and ostial through proximal ramus intermedius.  Mild, nonobstructive CAD noted in the LCx and RCA. 2. Normal left ventricular systolic function (LVEF 46-56%) with normal filling pressure (LVEDP 10 mmHg) 3. Successful IVUS-guided PCI to ostial through mid LAD using Onyx Frontier 2.75 x 38 mm drug-eluting stent with 0% residual stenosis and TIMI-3 flow 4. Successful PCI to ostial through proximal ramus intermedius using Onyx Frontier 2.0 x 22 mm drug-eluting stent with 0% residual stenosis and TIMI-3 flow  '21 ECHO: EF 55-60%. The LV has normal function, no regional wall motion abnormalities. There is mild left ventricular hypertrophy, normal RVF, no significant valvular abnormalities   Neuro/Psych Depression negative neurological ROS     GI/Hepatic Neg liver ROS, GERD  Controlled,  Endo/Other  Hypothyroidism   Renal/GU negative Renal ROS     Musculoskeletal  (+) Arthritis ,   Abdominal   Peds  Hematology  (+) Blood dyscrasia (Hb 7.0, transfusion in progress), anemia , Brilinta, ASA    Anesthesia Other Findings   Reproductive/Obstetrics                            Anesthesia  Physical Anesthesia Plan  ASA: 3  Anesthesia Plan: MAC   Post-op Pain Management: Minimal or no pain anticipated   Induction:   PONV Risk Score and Plan: 3 and Ondansetron and Treatment may vary due to age or medical condition  Airway Management Planned: Natural Airway and Nasal Cannula  Additional Equipment: None  Intra-op Plan:   Post-operative Plan:   Informed Consent: I have reviewed the patients History and Physical, chart, labs and discussed the procedure including the risks, benefits and alternatives for the proposed anesthesia with the patient or authorized representative who has indicated his/her understanding and acceptance.     Dental advisory given  Plan Discussed with: CRNA and Surgeon  Anesthesia Plan Comments:        Anesthesia Quick Evaluation

## 2021-11-05 NOTE — Progress Notes (Signed)
PROGRESS NOTE    Beverly [name]  FFM:[id number] DOB: 26-Sep-1945 DOA: 11/03/2021 PCP: Isaac Bliss, Rayford Halsted, MD   Brief Narrative:  76 y.o. F with CVID, CAD s/p DES in Apr 2023, ILD with severe tracheobronchomalacia, hx SVT, bronchiectasis, lymphocytic colitis, and hypothyroidism presented with hematemesis and hematochezia.  On presentation, hemoglobin was 9.7 from baseline of 12.6 in July 2023.  CT of abdomen and pelvis showed mild distal colonic wall thickening compatible with colitis.  GI was consulted.  Assessment & Plan:   GI bleed Acute blood loss anemia -Presented with coffee-ground emesis and clots in her stools.  Aspirin and Brilinta on hold.  Celebrex on hold as well -CT imaging as above -Continue IV PPI -Hemoglobin 9.7 on presentation, 7 today.  Transfuse 1 unit packed red cells.  Monitor H&H.  CAD with history of DES to LAD on 05/19/2021 -Aspirin and Brilinta on hold.  Cardiology to weigh in regarding antiplatelet therapy.  Continue Lipitor.  Lymphocytic colitis -GI following  Common variable immunodeficiency -Continue IVIG weekly  Tracheobronchomalacia -Asymptomatic.  Outpatient follow-up  Leukocytosis -Resolved  Thrombocytopenia -monitor  Hypokalemia -Replace.  Repeat a.m. labs  Hypothyroidism--continue levothyroxine  Interstitial lung disease -Not on home oxygen.  Currently stable  Chronic back pain -Celebrex on hold.  Continue current pain management  DVT prophylaxis: SCD's Code Status: Full Family Communication: None at bedside Disposition Plan: Status is: Inpatient Remains inpatient appropriate because: Of severity of illness  Consultants: GI  Procedures: None  Antimicrobials: None   Subjective: Patient seen and examined at bedside.  Had black stools overnight.  Denies worsening abdominal pain, nausea, vomiting, shortness of breath.  Objective: Vitals:   11/04/21 2003 11/05/21 0021 11/05/21 0434 11/05/21 0718  BP: (!) 111/51  121/82 (!) 102/58 (!) 103/57  Pulse: 65 92 81 62  Resp:      Temp: 97.7 F (36.5 C) 97.8 F (36.6 C) 97.9 F (36.6 C) 97.9 F (36.6 C)  TempSrc: Oral Oral Oral Oral  SpO2: 100% 100% 99% 100%  Weight:      Height:        Intake/Output Summary (Last 24 hours) at 11/05/2021 0913 Last data filed at 11/04/2021 1845 Gross per 24 hour  Intake 1000.22 ml  Output --  Net 1000.22 ml   Filed Weights   11/04/21 1833  Weight: 61.9 kg    Examination:  General exam: Appears calm and comfortable.  Currently on room air. Respiratory system: Bilateral decreased breath sounds at bases Cardiovascular system: S1 & S2 heard, Rate controlled Gastrointestinal system: Abdomen is nondistended, soft and mildly tender.  Normal bowel sounds heard. Extremities: No cyanosis, clubbing, edema  Central nervous system: Alert and oriented. No focal neurological deficits. Moving extremities Skin: No rashes, lesions or ulcers Psychiatry: Judgement and insight appear normal. Mood & affect appropriate.     Data Reviewed: I have personally reviewed following labs and imaging studies  CBC: Recent Labs  Lab 11/03/21 1954 11/04/21 0604 11/05/21 0426  WBC 14.5* 8.5 6.9  NEUTROABS 12.0*  --   --   HGB 9.7* 8.2* 7.0*  HCT 30.0* 24.3* 21.3*  MCV 102.0* 99.2 100.5*  PLT 188 156 570*   Basic Metabolic Panel: Recent Labs  Lab 11/03/21 1954 11/05/21 0426  NA 141 143  K 3.8 3.3*  CL 107 113*  CO2 24 23  GLUCOSE 204* 92  BUN 37* 12  CREATININE 0.62 0.60  CALCIUM 9.1 8.5*   GFR: Estimated Creatinine Clearance: 54.1 mL/min (by C-G formula based  on SCr of 0.6 mg/dL). Liver Function Tests: No results for input(s): "AST", "ALT", "ALKPHOS", "BILITOT", "PROT", "ALBUMIN" in the last 168 hours. No results for input(s): "LIPASE", "AMYLASE" in the last 168 hours. No results for input(s): "AMMONIA" in the last 168 hours. Coagulation Profile: No results for input(s): "INR", "PROTIME" in the last 168  hours. Cardiac Enzymes: No results for input(s): "CKTOTAL", "CKMB", "CKMBINDEX", "TROPONINI" in the last 168 hours. BNP (last 3 results) No results for input(s): "PROBNP" in the last 8760 hours. HbA1C: Recent Labs    11/05/21 0426  HGBA1C 4.9   CBG: No results for input(s): "GLUCAP" in the last 168 hours. Lipid Profile: No results for input(s): "CHOL", "HDL", "LDLCALC", "TRIG", "CHOLHDL", "LDLDIRECT" in the last 72 hours. Thyroid Function Tests: No results for input(s): "TSH", "T4TOTAL", "FREET4", "T3FREE", "THYROIDAB" in the last 72 hours. Anemia Panel: No results for input(s): "VITAMINB12", "FOLATE", "FERRITIN", "TIBC", "IRON", "RETICCTPCT" in the last 72 hours. Sepsis Labs: Recent Labs  Lab 11/03/21 1954 11/04/21 0604 11/04/21 4315  PROCALCITON  --  <0.10  --   LATICACIDVEN 2.3*  --  1.2    No results found for this or any previous visit (from the past 240 hour(s)).       Radiology Studies: CT ABDOMEN PELVIS W CONTRAST  Result Date: 11/03/2021 CLINICAL DATA:  Rectal bleeding, hematemesis, recent diagnosis of colitis EXAM: CT ABDOMEN AND PELVIS WITH CONTRAST TECHNIQUE: Multidetector CT imaging of the abdomen and pelvis was performed using the standard protocol following bolus administration of intravenous contrast. RADIATION DOSE REDUCTION: This exam was performed according to the departmental dose-optimization program which includes automated exposure control, adjustment of the mA and/or kV according to patient size and/or use of iterative reconstruction technique. CONTRAST:  73m OMNIPAQUE IOHEXOL 350 MG/ML SOLN COMPARISON:  None Available. FINDINGS: Lower chest: No acute pleural or parenchymal lung disease. Hepatobiliary: Mild hepatic steatosis. No focal liver abnormality. Cholecystectomy. Pancreas: Unremarkable. No pancreatic ductal dilatation or surrounding inflammatory changes. Spleen: Normal in size without focal abnormality. Adrenals/Urinary Tract: Adrenal glands are  unremarkable. Kidneys are normal, without renal calculi, focal lesion, or hydronephrosis. Bladder is unremarkable. Stomach/Bowel: No bowel obstruction or ileus. There is mild diffuse colonic wall thickening from the splenic flexure through the rectum, compatible with given history of recent colitis. Normal appendix right lower quadrant. Vascular/Lymphatic: Aortic atherosclerosis. No enlarged abdominal or pelvic lymph nodes. Reproductive: Status post hysterectomy. No adnexal masses. Other: No free fluid or free intraperitoneal gas. No abdominal wall hernia. Musculoskeletal: No acute or destructive bony lesions. Multilevel lumbar spondylosis and facet hypertrophy, with grade 1 degenerative anterolisthesis of L4 on L5. Reconstructed images demonstrate no additional findings. IMPRESSION: 1. Mild distal colonic wall thickening compatible with colitis. 2.  Aortic Atherosclerosis (ICD10-I70.0). Electronically Signed   By: MRanda NgoM.D.   On: 11/03/2021 23:12        Scheduled Meds:  atorvastatin  40 mg Oral Daily   gabapentin  800 mg Oral QHS   Immune Globulin (Human)-klhw  10 g Subcutaneous Q Wed   levothyroxine  25 mcg Oral Daily   methocarbamol  500 mg Oral BID   montelukast  10 mg Oral Daily   pantoprazole (PROTONIX) IV  40 mg Intravenous Q12H   PARoxetine  30 mg Oral Daily   triamcinolone  2 spray Nasal QHS   Continuous Infusions:        KAline August MD [facility] 11/05/2021, 9:13 AM

## 2021-11-05 NOTE — Op Note (Addendum)
[facility] Patient Name: Beverly [name] Procedure Date : 11/05/2021 MRN: [id number] Attending MD: Thornton Park MD, MD Date of Birth: August 31, 1945 CSN: [id number] Age: 76 Admit Type: Inpatient Procedure:                Upper GI endoscopy Indications:              Coffee-ground emesis, Melena Providers:                Thornton Park MD, MD, Grace Isaac, RN, [facility] Technician, Technician Referring MD:              Medicines:                Monitored Anesthesia Care Complications:            No immediate complications. Estimated Blood Loss:     Estimated blood loss was minimal. Procedure:                Pre-Anesthesia Assessment:                           - Prior to the procedure, a History and Physical                            was performed, and patient medications and                            allergies were reviewed. The patient's tolerance of                            previous anesthesia was also reviewed. The risks                            and benefits of the procedure and the sedation                            options and risks were discussed with the patient.                            All questions were answered, and informed consent                            was obtained. Prior Anticoagulants: The patient has                            taken Brilinta, last dose was 1 day prior to                            procedure. ASA Grade Assessment: III - A patient                            with severe systemic disease. After reviewing the  risks and benefits, the patient was deemed in                            satisfactory condition to undergo the procedure.                           After obtaining informed consent, the endoscope was                            passed under direct vision. Throughout the                            procedure, the patient's blood pressure, pulse, and                             oxygen saturations were monitored continuously. The                            [id number]-[id number] ([id number] Olympus endoscope was introduced                            through the mouth, and advanced to the third part                            of duodenum. The upper GI endoscopy was                            accomplished without difficulty. The patient                            tolerated the procedure well. Scope In: Scope Out: Findings:      LA Grade A (one or more mucosal breaks less than 5 mm, not extending       between tops of 2 mucosal folds) esophagitis with no bleeding was found       36 cm from the incisors.      One non-bleeding cratered gastric ulcer with a clean ulcer base (Forrest       Class III) was found in the gastric antrum. The surrounding mucosa is       deformed. The lesion was 10 mm in largest dimension. There is mild       erythema and granularity throughout the body and antrum. Biopsies were       taken from the antrum, body, and fundus with a cold forceps for       histology. Estimated blood loss was minimal.      A single diverticulum was present in the second portion of the duodenum.       The examined duodenum was otherwise normal. No blood present. Impression:               - LA Grade A reflux esophagitis with no bleeding.                           - Non-bleeding gastric ulcer with a clean ulcer  base (Forrest Class III). Likely due to Celebrex.                           - Mild gastropathy. Biopsied.                           - Duodenal diverticulum. Recommendation:           - Return patient to hospital ward for ongoing care.                           - Resume previous diet.                           - Continue present medications.                           - Pantoprazole 40 mg BID x 10 weeks, then 40 mg QAM.                           - Await pathology results.                           - Okay to continue Brilinta at this time.                            - No aspirin, ibuprofen, naproxen, or other                            non-steroidal anti-inflammatory drugs including                            Celebrex.                           - Repeat EGD in 8-10 weeks with Dr. Bryan Lemma to                            document healing.                           No additional inpatient GI evaluation planned at                            this time. Please call the on-call                            gastroenterologist with any additional questions or                            concerns during this hospitalization. Procedure Code(s):        --- Professional ---                           [id number], Esophagogastroduodenoscopy, flexible,  transoral; with biopsy, single or multiple Diagnosis Code(s):        --- Professional ---                           K21.00, Gastro-esophageal reflux disease with                            esophagitis, without bleeding                           K25.9, Gastric ulcer, unspecified as acute or                            chronic, without hemorrhage or perforation                           K92.0, Hematemesis                           K92.1, Melena (includes Hematochezia) CPT copyright 2019 [employer]. All rights reserved. The codes documented in this report are preliminary and upon coder review may  be revised to meet current compliance requirements. Thornton Park MD, MD 11/05/2021 2:26:22 PM This report has been signed electronically. Number of Addenda: 0

## 2021-11-05 NOTE — Telephone Encounter (Signed)
-----   Message from Thornton Park, MD sent at 11/05/2021  2:17 PM EDT ----- Patient of Dr. Vivia Ewing admitted with symptomatic gastric ulcer. She will go home on pantoprazole 40 mg BID. Gastric biopsies obtained for H pylori. She needs office follow-up with Dr. Bryan Lemma or an APP in 3-4 weeks and a follow-up EGD in 8-10 weeks.  Thanks.  KLB

## 2021-11-05 NOTE — Transfer of Care (Signed)
Immediate Anesthesia Transfer of Care Note  Patient: Beverly [name]  Procedure(s) Performed: ESOPHAGOGASTRODUODENOSCOPY (EGD) WITH PROPOFOL BIOPSY  Patient Location: PACU  Anesthesia Type:MAC  Level of Consciousness: drowsy and patient cooperative  Airway & Oxygen Therapy: Patient Spontanous Breathing and Patient connected to nasal cannula oxygen  Post-op Assessment: Report given to RN and Post -op Vital signs reviewed and stable  Post vital signs: Reviewed and stable  Last Vitals:  Vitals Value Taken Time  BP 80/35 11/05/21 1419  Temp    Pulse 52 11/05/21 1420  Resp 16 11/05/21 1420  SpO2 97 % 11/05/21 1420  Vitals shown include unvalidated device data.  Last Pain:  Vitals:   11/05/21 1337  TempSrc: Oral  PainSc:          Complications: No notable events documented.

## 2021-11-05 NOTE — Consult Note (Addendum)
Cardiology Consultation   Patient ID: Beverly [name] MRN: [id number]; DOB: [birth year]  Admit date: 11/03/2021 Date of Consult: 11/05/2021  PCP:  Isaac Bliss, [facility] Cardiologist:  [name], MD        Patient Profile:   Beverly [name] is a 76 y.o. female with a hx of CAD post PCI, SVT, nocturnal bradycardia, HLD, OSA, ILD with severe tracheobronchomalacia followed by pulmonary who is being seen 11/05/2021 for the evaluation of Brilinta use at the request of Dr Starla Link.  History of Present Illness:   Beverly [name] with above hx including coronary CTA prior to back surgery that suggested a tight LAD and Ramus.  Her back surgery was cancelled and she underwent cath and PCI with DES to LAD and DES to Ramus.  We recommended stopping her celebrex but she could not stop due to pain.    She has done well though once episode of chest pain in July this year seen in ER and trop neg EKG without changes and felt to be M-S and reflux related.   She has completed silver sneakers.    Last Echo 2021 with EF 55-60% no RWMA mild LVH, stable valves.   Was doing well on last OV 10/21/21. She had been cleared for back surgery and could hold Brilinta in Oct.     Pt presented to ER by EMS from home 11/03/21 with rectal bleeding.  Blood in emesis as well.  Began at 1730 day of admit.  Pt does have hx of colitis.    Initial Hgb 9.7 stool heme +, Cr 0.6, CT abdomen pelvis showing mild distal colonic wall thickening compatible with colitis. Patient was given Zofran, IV Protonix 40 mg, and 500 cc normal saline bolus.  [facility] (Dr. Silverio Decamp) consulted.   She described cofe ground emesis and then rectal bleeding of bright red blood with dark clots.    Her ASA, Brilinta and celecoxib were stopped.   She had no angina.  Today she underwent upper GI with LA grade A reflux esophagitis with no bleeding, , non bleeding gastric ulcer with clean base, mild gastropathy, duodenal  diverticulum.   She will need protonix 40 BID for 10 weeks then 40 mg every AM.  Per GI ok to continue Brilinta, na asa, ibuprofen, naproxen or other non steroidal antiinflammatory drugs. No celebrex.  EKG:  The EKG was personally reviewed and demonstrates:  ordered Telemetry:  Telemetry was personally reviewed and demonstrates:  SR to SB with PVCs   Hgb today 7.0 and plts 142.   Appears to have 2 units PRBC ordered and transfused..   Na 142 K+ 3.7 Cr 0.40   No chest pain no SOB some dizziness on first day with vomiting and diarrhea.  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   right sholder   BCC (basal cell carcinoma of skin) 05/09/2014   mid lower back   BCC (basal cell carcinoma of skin) 05/03/2017   right low back   BCC (basal cell carcinoma of skin) 05/03/2017   left upper back   BCC (basal cell carcinoma of skin) 07/05/2018   left mid  back   BCC (basal cell carcinoma of skin) 05/20/1992   upper back   BCC (basal cell carcinoma of skin) 07/29/1993   left sholder medial   BCC (basal cell carcinoma of skin) 07/29/1993   left sholder lateral   BCC (basal cell carcinoma of skin) 07/29/1993   right thigh   BCC (basal cell carcinoma of skin) 07/29/1993   right sholder   BCC (basal cell carcinoma of skin) 12/22/1994   right mid forearm   BCC (basal cell carcinoma of skin) 12/22/1994   right upper forearm   BCC (basal cell carcinoma of skin) 12/22/1994   lower right upper arm   BCC (basal cell carcinoma of skin) 12/22/1994   right upper arm sholder   BCC (basal cell carcinoma of skin) 08/11/1995   left leg below knee   BCC (basal cell carcinoma of skin) 04/11/2002   mid back   BCC (basal cell carcinoma of skin) 12/10/2002    right center upper back   BCC (basal cell carcinoma of skin) 05/26/2005   right post sholder   BCC (basal cell carcinoma) 05/09/2014   left inner shin   BCC (basal cell carcinoma) 06/12/2014   left forearm   Bowen's disease 10/07/1994   right post knee, right inner forearm/wrist   Bowen's disease 08/11/1995   left sholder   Bronchiectasis ([place])    Cataract    left   Chronic pain    Common variable immunodeficiency [facility])    sees dr Gayleen Orem 02-11-2021 epic   Coronary artery disease    Depression    hx of   Diverticulosis of colon 1998   mild   DJD (degenerative joint disease)    Duodenal ulcer    h/o yrs ago   GERD (gastroesophageal reflux disease)    History of SCC (squamous cell carcinoma) of skin    Dr. Denna Haggard   History of sinus bradycardia    HLD (hyperlipidemia)    hypertriglyceridemia   Hypertensive retinopathy of both eyes 11/18/2017   Hypothyroidism    IBS (irritable bowel syndrome)    Internal hemorrhoids 1998   Lymphocytic colitis    Mitral regurgitation    mild   Nocardiosis    relased by infection disease dec 2022   Osteoarthritis    feet,shoulder,neck,back,hips and hands.   Pneumonia 2015   PONV (postoperative nausea and vomiting)    Rotator cuff tear, right 02/2019   infraspinatus and supraspinatus, and dislocation of long head of bicep tendon (Dr. Alvan Dame)   SCC (squamous cell carcinoma) 11/26/2014   left hand, right hand, right deltoid mnay areas   SCC (squamous cell carcinoma) 05/03/2017   left cheek   Sleep apnea    uses a mouth guard nightly   Squamous cell carcinoma in situ (SCCIS) 07/05/2018   left hand   Tracheobronchomalacia    Vitamin D deficiency    mild    Past Surgical History:  Procedure Laterality Date   ABDOMINAL HYSTERECTOMY  1998   complete   BLEPHAROPLASTY Bilateral 01/2018   BROW LIFT Bilateral 10/10/2018   Procedure: BROW LIFT;  Surgeon: Wallace Going, DO;  Location: [facility];  Service: Plastics;   Laterality: Bilateral;   BUNIONECTOMY     R 12/08, L 2004 (Dr. Janus Molder)   [place]   bilateral   CARPAL TUNNEL RELEASE Left 03/06/2021   Procedure: Left Revision Carpal Tunnel Release with hypothenar fat pad flap;  Surgeon: [name], MD;  Location[facility];  Service: Orthopedics;  Laterality: Left;  with local anesthesia   CATARACT EXTRACTION Bilateral Left in 08/2012, Right 04/2016   Dr.Hecker   CESAREAN SECTION     1981 and [place]   laparoscopic   CLOSED REDUCTION NASAL FRACTURE N/A 08/29/2018   Procedure: CLOSED REDUCTION NASAL FRACTURE;  Surgeon: Wallace Going, DO;  Location: [place];  Service: Plastics;  Laterality: N/A;  1 hour, please   COLONOSCOPY  2006   COLONOSCOPY  01/2012   due again 01/2021; mild diverticulosis   CORONARY STENT INTERVENTION N/A 05/19/2021   Procedure: CORONARY STENT INTERVENTION;  Surgeon: Nelva Bush, MD;  Location: [place];  Service: Cardiovascular;  Laterality: N/A;   epidural steroid injection, back  02/2010   HIP SURGERY     right bursectomy x 3   HIP SURGERY Right 2000   torn cartilage, repaired   INGUINAL HERNIA REPAIR  10/2007   bilat   INTRAVASCULAR ULTRASOUND/IVUS N/A 05/19/2021   Procedure: Intravascular Ultrasound/IVUS;  Surgeon: Nelva Bush, MD;  Location: [place];  Service: Cardiovascular;  Laterality: N/A;   LEFT HEART CATH AND CORONARY ANGIOGRAPHY N/A 05/19/2021   Procedure: LEFT HEART CATH AND CORONARY ANGIOGRAPHY;  Surgeon: Nelva Bush, MD;  Location: [place] [facility];  Service: Cardiovascular;  Laterality: N/A;   NECK SURGERY  1989   c6-7 cervical laminectomy and diskecotmy   REVERSE SHOULDER ARTHROPLASTY Right 04/11/2019   Procedure: REVERSE SHOULDER ARTHROPLASTY;  Surgeon: Nicholes Stairs, MD;  Location: [facility];  Service: Orthopedics;  Laterality: Right;  Regional Block    SHOULDER SURGERY Left 03/2003   left rotator cuff repair   SHOULDER SURGERY Left 03/14/2018   rotator cuff repair; Dr. Tonita Cong   TONSILLECTOMY  age 48   TROCHANTERIC [place]     as teenager     Home Medications:  Prior to Admission medications   Medication Sig Start Date End Date Taking? Authorizing Provider  acetaminophen (TYLENOL) 500 MG tablet Take 1,000 mg by mouth every 6 (six) hours as needed for moderate pain.    Yes [provider]  albuterol (VENTOLIN HFA) 108 (90 Base) MCG/ACT inhaler Inhale 2 puffs into the lungs every 6 (six) hours as needed for wheezing or shortness of breath. 08/12/21  Yes Kozlow, Donnamarie Poag, MD  aspirin EC 81 MG tablet Take 81 mg by mouth daily.   Yes [provider]  atorvastatin (LIPITOR) 40 MG tablet Take 1 tablet by mouth daily. 07/23/21 07/23/22 Yes Erline Hau, MD  budesonide (ENTOCORT EC) 3 MG 24 hr capsule Take 3 capsules (9 mg total) by mouth daily for 28 days, THEN 2 capsules (6 mg total) daily for 56 days, THEN 1 capsule (3 mg total) daily. 08/05/21 12/30/21 Yes Cirigliano, Vito V, DO  Buprenorphine HCl (BELBUCA) 750 MCG FILM Place 1 film to inside of mouth every 12 hours 09/12/21  Yes   Calcium Carbonate-Vitamin D 600-200 MG-UNIT TABS Take 1 tablet by mouth 2 (two) times daily.   Yes [provider]  celecoxib (CELEBREX) 200 MG capsule Take 1 capsule (200 mg total) by mouth 2 (two) times daily. 10/23/21  Yes Isaac Bliss, Rayford Halsted, MD  colestipol (COLESTID) 1 g tablet Take 1 tablet by mouth daily. 10/21/21  Yes Kozlow, Donnamarie Poag, MD  diclofenac Sodium (VOLTAREN)  1 % GEL Apply 2 g topically 4 (four) times daily as needed (joint pain). 12/29/18  Yes [provider]  Docusate Sodium 100 MG capsule Take 100 mg by mouth daily. 09/27/20  Yes [provider]  famotidine (PEPCID) 40 MG tablet Take 1 tablet by mouth at bedtime. 08/12/21  Yes  Kozlow, Donnamarie Poag, MD  Fluorouracil (TOLAK) 4 % CREA Apply to aa total 28 applications & sun protect 04/07/21  Yes Lavonna Monarch, MD  gabapentin (NEURONTIN) 800 MG tablet Take 1 tablet by mouth at bedtime. Patient taking differently: Take 800 mg by mouth at bedtime. 10/29/21  Yes   Guaifenesin 1200 MG TB12 Take 1,200 mg by mouth 2 (two) times daily.   Yes [provider]  HYDROcodone-acetaminophen (NORCO) 7.5-325 MG tablet Take 1 tablet by mouth four times a day as needed for pain 06/20/21  Yes   ipratropium (ATROVENT) 0.03 % nasal spray Place 1 spray into both nostrils 3 (three) times daily. 08/12/21  Yes Kozlow, Donnamarie Poag, MD  Lactobacillus-Inulin (CULTURELLE ADULT ULT BALANCE) CAPS Take 1 capsule by mouth daily.   Yes [provider]  levothyroxine (SYNTHROID) 25 MCG tablet Take 1 tablet (25 mcg total) by mouth daily. 05/08/21  Yes Isaac Bliss, Rayford Halsted, MD  loratadine (CLARITIN) 10 MG tablet Take 1 tablet (10 mg total) by mouth daily as needed for allergies (Can take an extra dose during flare ups.). 08/12/21  Yes Kozlow, Donnamarie Poag, MD  Melatonin-Pyridoxine (MELATIN PO) Take 5 mg by mouth at bedtime. 02/13/20  Yes [provider]  methocarbamol (ROBAXIN) 500 MG tablet Take 1 tablet by mouth three times a day as needed Patient taking differently: Take 500 mg by mouth 2 (two) times daily. 06/20/21  Yes   montelukast (SINGULAIR) 10 MG tablet Take 1 tablet (10 mg total) by mouth at bedtime. Patient taking differently: Take 10 mg by mouth daily. 08/12/21  Yes Kozlow, Donnamarie Poag, MD  Multiple Vitamins-Minerals (MULTIVITAMIN WITH MINERALS) tablet Take 1 tablet by mouth daily.   Yes [provider]  nitroGLYCERIN (NITROSTAT) 0.4 MG SL tablet Place 1 tablet (0.4 mg total) under the tongue every 5 (five) minutes as needed. 05/20/21  Yes Reino Bellis B, NP  omega-3 acid ethyl esters (LOVAZA) 1 g capsule TAKE 2 CAPSULES BY MOUTH 2 TIMES DAILY Patient taking differently: Take 2 g by  mouth 2 (two) times daily. 10/15/21 10/15/22 Yes Erline Hau, MD  ondansetron (ZOFRAN-ODT) 4 MG disintegrating tablet Take 1 tablet by mouth every 6 (six) hours as needed for nausea or vomiting. 03/25/21  Yes Cirigliano, Vito V, DO  pantoprazole (PROTONIX) 40 MG tablet Take 1 tablet (40 mg total) by mouth daily with breakfast. 08/07/21  Yes Josue [name], MD  PARoxetine (PAXIL) 30 MG tablet Take 1 tablet by mouth once a day. Patient taking differently: Take 30 mg by mouth at bedtime. 06/12/21  Yes   ticagrelor (BRILINTA) 90 MG TABS tablet Take 1 tablet by mouth 2 times daily. (stop Plavix (clopidogrel)) 06/16/21  Yes Imogene Burn, PA-C  triamcinolone (NASACORT) 55 MCG/ACT AERO nasal inhaler Place 2 sprays into each nostril once daily. 10/10/21  Yes Kozlow, Donnamarie Poag, MD  XEMBIFY 10 GM/50ML SOLN Inject 10 g into the skin once a week. Wednesday 07/21/21  Yes [provider]  EPINEPHrine 0.3 mg/0.3 mL IJ SOAJ injection Inject 0.3 mg into the muscle as needed for anaphylaxis. Patient not taking: Reported on 11/04/2021 01/21/21   [provider]  potassium chloride (KLOR-CON) 10 MEQ tablet Take 2 tablets (total 20 MEQ) by mouth once 03/29/20 04/16/20  Rosiland Oz, MD    Inpatient Medications: Scheduled Meds:  sodium chloride   Intravenous Once   atorvastatin  40 mg Oral Daily   gabapentin  800 mg Oral QHS   Immune Globulin (Human)-klhw  10 g Subcutaneous Q Wed   levothyroxine  25 mcg Oral Daily   methocarbamol  500 mg Oral BID   montelukast  10 mg Oral Daily   pantoprazole (PROTONIX) IV  40 mg Intravenous Q12H   PARoxetine  30 mg Oral Daily   triamcinolone  2 spray Nasal QHS   Continuous Infusions:  albumin human     PRN Meds: albumin human, HYDROcodone-acetaminophen, loratadine  Allergies:    Allergies  Allergen Reactions   Plavix [Clopidogrel] Rash   Adhesive [Tape] Rash   Codeine Rash    Social History:   Social History   Socioeconomic History    Marital status: Divorced    Spouse name: Not on file   Number of children: 2   Years of education: Not on file   Highest education level: Master's degree (e.g., MA, MS, MEng, MEd, MSW, MBA)  Occupational History   Occupation: STAFF Optician, dispensing: [employer]   Occupation: Retired  Tobacco Use   Smoking status: Never   Smokeless tobacco: Never  Vaping Use   Vaping Use: Never used  Substance and Sexual Activity   Alcohol use: Yes    Alcohol/week: 7.0 standard drinks of alcohol    Types: 7 Glasses of wine per week    Comment: 1 glass wine per day   Drug use: No   Sexual activity: Not Currently  Other Topics Concern   Not on file  Social History Narrative   Divorced, lives alone, [place]; 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   [place] for [employer] part-time   Social Determinants of Health   Financial Resource Strain: [place]  (05/17/2021)   Overall Financial Resource Strain (CARDIA)    Difficulty of Paying Living Expenses: Not hard at all  Food Insecurity: No [place] (05/17/2021)   Hunger Vital Sign    Worried About Running Out of Food in the Last Year: Never true    [place] in the Last Year: Never true  Transportation Needs: No Transportation Needs (05/17/2021)   PRAPARE - Hydrologist (Medical): No    Lack of Transportation (Non-Medical): No  Physical Activity: Insufficiently Active (05/17/2021)   Exercise Vital Sign    Days of Exercise per Week: 1 day    Minutes of Exercise per Session: 10 min  Stress: No Stress Concern Present (05/17/2021)   [place]    Feeling of Stress : Not at all  Social Connections: Moderately Integrated (05/17/2021)   Social Connection and Isolation Panel [NHANES]    Frequency of Communication with  Friends and Family: More than three times a week    Frequency of Social Gatherings with Friends and Family: More than three times a week    Attends Religious Services: More than 4 times per year    Active Member of [employer] or Organizations: Yes    Attends Music therapist: More than 4 times per year    Marital Status: Divorced  Intimate Partner Violence: Not At Risk (02/04/2021)   Humiliation, Afraid, Rape, and Kick questionnaire    Fear of Current or Ex-Partner: No    Emotionally Abused: No    Physically Abused: No    Sexually Abused: No    Family History:    Family History  Problem Relation Age of Onset   Depression Mother    Schizophrenia Mother    Cancer Father        oral   Heart disease Father        bradycardia   Esophageal cancer Father    Cancer Sister        skin and lung   COPD Sister    Hypertension Sister    Osteoporosis Sister        compression fx's x 3 11/2018   Hyperthyroidism Brother    Cancer Brother        metastatic cancer to bone (?primary)   Diabetes Maternal Grandmother    Cancer Paternal Grandmother 38       colon cancer, metastatic to liver   Colon cancer Paternal Grandmother    Liver cancer Paternal Grandmother    Hyperlipidemia Daughter    Asthma Son    ADD / ADHD Other      ROS:  Please see the history of present illness.  General:no colds or fevers, no weight changes Skin:no rashes or ulcers HEENT:no blurred vision, no congestion CV:see HPI PUL:see HPI GI:no diarrhea constipation or melena, no indigestion GU:no hematuria, no dysuria MS:no joint pain, no claudication Neuro:no syncope, no lightheadedness Endo:no diabetes, no thyroid disease Sleep apnea and uses guard at night  All other ROS reviewed and negative.     Physical Exam/Data:   Vitals:   11/05/21 1500 11/05/21 1505 11/05/21 1515 11/05/21 1520  BP: (!) 109/53 (!) 109/53 (!) 116/52 (!) 115/47  Pulse: (!) 48 (!) 49 (!) 47 (!) 48  Resp: '17 17 17 17  '$ Temp:   98 F (36.7 C)  98.3 F (36.8 C)  TempSrc:  Oral    SpO2: 100% 100% 100% 100%  Weight:      Height:        Intake/Output Summary (Last 24 hours) at 11/05/2021 1543 Last data filed at 11/05/2021 1337 Gross per 24 hour  Intake 1315.22 ml  Output --  Net 1315.22 ml      11/04/2021    6:33 PM 10/27/2021    3:11 PM 10/21/2021   12:17 PM  Last 3 Weights  Weight (lbs) 136 lb 6.4 oz 141 lb 6.4 oz 142 lb 12.8 oz  Weight (kg) 61.871 kg 64.139 kg 64.774 kg     Body mass index is 23.41 kg/m.  General:  Well nourished, well developed, in no acute distress is somewhat pale HEENT: normal Neck: no JVD Vascular: No carotid bruits; Distal pulses 2+ bilaterally Cardiac:  normal S1, S2; RRR; no murmur gallup rub or click Lungs:  clear to auscultation bilaterally, no wheezing, rhonchi or rales  Abd: soft, nontender, no hepatomegaly  Ext: no edema Musculoskeletal:  No deformities, BUE and BLE strength normal and equal Skin: warm and dry  Neuro:  alert and oriented X 3 MAE follows commands, no focal abnormalities noted Psych:  Normal affect    Relevant CV Studies: Cath: 05/19/21   Conclusions: Severe two-vessel coronary artery disease with up to 90% stenoses involving the ostial through mid LAD and ostial through proximal ramus intermedius.  Mild, nonobstructive CAD noted in the LCx and RCA. Normal left  ventricular systolic function (LVEF 63-84%) with normal filling pressure (LVEDP 10 mmHg) Successful IVUS-guided PCI to ostial through mid LAD using Onyx Frontier 2.75 x 38 mm drug-eluting stent with 0% residual stenosis and TIMI-3 flow Successful PCI to ostial through proximal ramus intermedius using Onyx Frontier 2.0 x 22 mm drug-eluting stent with 0% residual stenosis and TIMI-3 flow.   Recommendations: Overnight extended recovery. Dual antiplatelet therapy with aspirin and clopidogrel, ideally for 6 months.  Nonemergent surgery will likely need to be delayed to allow for adequate antiplatelet  therapy following two-vessel PCI. Aggressive secondary prevention.   Nelva Bush, MD [facility]   Diagnostic Dominance: Right Intervention         Echo from 06/23/19:    1. Left ventricular ejection fraction, by estimation, is 55 to 60%. The  left ventricle has normal function. The left ventricle has no regional  wall motion abnormalities. There is mild left ventricular hypertrophy.  Left ventricular diastolic function  could not be evaluated.   2. Right ventricular systolic function is normal. The right ventricular  size is normal.   3. The mitral valve is grossly normal. Trivial mitral valve  regurgitation. No evidence of mitral stenosis.   4. The aortic valve has an indeterminant number of cusps. Aortic valve  regurgitation is not visualized. Mild aortic valve sclerosis is present,  with no evidence of aortic valve stenosis.     Laboratory Data:  High Sensitivity Troponin:  No results for input(s): "TROPONINIHS" in the last 720 hours.   Chemistry Recent Labs  Lab 11/03/21 1954 11/05/21 0426 11/05/21 1432  NA 141 143 142  K 3.8 3.3* 3.7  CL 107 113* 107  CO2 24 23  --   GLUCOSE 204* 92 91  BUN 37* 12 11  CREATININE 0.62 0.60 0.40*  CALCIUM 9.1 8.5*  --   GFRNONAA >60 >60  --   ANIONGAP 10 7  --     No results for input(s): "PROT", "ALBUMIN", "AST", "ALT", "ALKPHOS", "BILITOT" in the last 168 hours. Lipids No results for input(s): "CHOL", "TRIG", "HDL", "LABVLDL", "LDLCALC", "CHOLHDL" in the last 168 hours.  Hematology Recent Labs  Lab 11/03/21 1954 11/04/21 0604 11/05/21 0426 11/05/21 1432  WBC 14.5* 8.5 6.9  --   RBC 2.94* 2.45* 2.12*  --   HGB 9.7* 8.2* 7.0* 7.1*  HCT 30.0* 24.3* 21.3* 21.0*  MCV 102.0* 99.2 100.5*  --   MCH 33.0 33.5 33.0  --   MCHC 32.3 33.7 32.9  --   RDW 14.9 15.0 15.2  --   PLT 188 156 142*  --    Thyroid No results for input(s): "TSH", "FREET4" in the last 168 hours.  BNPNo results for input(s): "BNP", "PROBNP" in  the last 168 hours.  DDimer No results for input(s): "DDIMER" in the last 168 hours.   Radiology/Studies:  CT ABDOMEN PELVIS W CONTRAST  Result Date: 11/03/2021 CLINICAL DATA:  Rectal bleeding, hematemesis, recent diagnosis of colitis EXAM: CT ABDOMEN AND PELVIS WITH CONTRAST TECHNIQUE: Multidetector CT imaging of the abdomen and pelvis was performed using the standard protocol following bolus administration of intravenous contrast. RADIATION DOSE REDUCTION: This exam was performed according to the departmental dose-optimization program which includes automated exposure control, adjustment of the mA and/or kV according to patient size and/or use of iterative reconstruction technique. CONTRAST:  72m OMNIPAQUE IOHEXOL 350 MG/ML SOLN COMPARISON:  None Available. FINDINGS: Lower chest: No acute pleural or parenchymal lung disease. Hepatobiliary: Mild hepatic steatosis. No focal liver abnormality.  Cholecystectomy. Pancreas: Unremarkable. No pancreatic ductal dilatation or surrounding inflammatory changes. Spleen: Normal in size without focal abnormality. Adrenals/Urinary Tract: Adrenal glands are unremarkable. Kidneys are normal, without renal calculi, focal lesion, or hydronephrosis. Bladder is unremarkable. Stomach/Bowel: No bowel obstruction or ileus. There is mild diffuse colonic wall thickening from the splenic flexure through the rectum, compatible with given history of recent colitis. Normal appendix right lower quadrant. Vascular/Lymphatic: Aortic atherosclerosis. No enlarged abdominal or pelvic lymph nodes. Reproductive: Status post hysterectomy. No adnexal masses. Other: No free fluid or free intraperitoneal gas. No abdominal wall hernia. Musculoskeletal: No acute or destructive bony lesions. Multilevel lumbar spondylosis and facet hypertrophy, with grade 1 degenerative anterolisthesis of L4 on L5. Reconstructed images demonstrate no additional findings. IMPRESSION: 1. Mild distal colonic wall  thickening compatible with colitis. 2.  Aortic Atherosclerosis (ICD10-I70.0). Electronically Signed   By: Randa Ngo M.D.   On: 11/03/2021 23:12     Assessment and Plan:   Acute GI bleed with UGI and per GI team.  Her Brilinta and asa on hold but per GI may resume Brilinta.   May resume plavix instead with less chance of bleeding.  Defer to MD CAD with stent DES to LAD with Onyx Frontier 2.75 x 38 mm drug-eluting stent and LCX with Onyx Frontier 2.0 x 22 mm drug-eluting stent on 05/19/21.   GI agreed that Brilinta could be resumed no ASA - it is not quite 6 months by about 2 weeks- defer to MD.    In addition she hopes to have back surgery in future. Tracheobronchomalacia stable HLD continue statin    Hx SVT/nocturnal brady no issues lately OSA with mouth guard  Lymphocytic colitis/hypokalemia/chronic back pain.      Risk Assessment/Risk Scores:        For questions or updates, please contact [place] Please consult [email or web address] for contact info under    Signed, Cecilie Kicks, NP  11/05/2021 3:43 PM    Patient seen and examined. Agree with assessment and plan.  This Agosto is a very pleasant 76 year old female who denied any anginal symptomatology but as part of preoperative assessment prior to undergoing back surgery she underwent coronary CTA May 16, 2021.  Despite a calcium score of 31 she was felt to have severe CAD in the proximal LAD and proximal ramus intermediate vessel.  She subsequently underwent accessible two-vessel coronary intervention by Dr. Saunders Revel with stenting of her LAD and ramus vessel on May 19, 2021.  She has been pain-free since and has continued to be on DAPT with aspirin/Brilinta.  He is tentatively scheduled to undergo elective lumbar surgery in October following 6 months of treatment but apparently was admitted to the hospital on November 03, 2021 with rectal bleeding.  The patient had been on chronic Celebrex in addition to her aspirin/Brilinta.   Hemoglobin had decreased to 7 and she required packed red blood cell transfusions.  She underwent endoscopy today and was found to have LA grade a reflux esophagitis with no bleeding, and a nonbleeding gastric ulcer with clean base, mild gastropathy, and duodenal diverticulum.  The procedure was done by Dr. Tarri Glenn.  It was felt to her GI bleed was secondary to anti-inflammatory therapy with Celebrex.  She has been given clearance to resume antiplatelet therapy without aspirin.  Presently she feels well.  She denies any history of chest tightness.  Blood pressure is stable 115/50.  Telemetry reveals sinus rhythm in the 60s.  HEENT is unremarkable.  There is no JVD.  Lungs were clear.  Rhythm is regular without ectopy.  There is no significant murmur.  Abdomen is nontender.  Pulses are 2+.  There is no edema.  ECG was done but not released for my review in epic.  With the patient's recent significant GI blood loss, we will reinstitute antiplatelet therapy but change to clopidogrel rather than resume Brilinta which is slightly less potent than Brilinta in light of her recent GI bleed.  Will resume clopidogrel tomorrow.  The patient will ultimately need to hold antiplatelet therapy for at least 5 to 6 days in October prior to planned elective back surgery.   [name], MD, [facility] 11/05/2021 5:14 PM

## 2021-11-05 NOTE — Anesthesia Postprocedure Evaluation (Signed)
Anesthesia Post Note  Patient: Beverly [name]  Procedure(s) Performed: ESOPHAGOGASTRODUODENOSCOPY (EGD) WITH PROPOFOL BIOPSY     Patient location during evaluation: PACU Anesthesia Type: MAC Level of consciousness: awake and alert, patient cooperative and oriented Pain management: pain level controlled Vital Signs Assessment: post-procedure vital signs reviewed and stable Respiratory status: nonlabored ventilation, spontaneous breathing and respiratory function stable Cardiovascular status: blood pressure returned to baseline and stable Postop Assessment: no apparent nausea or vomiting Anesthetic complications: no Comments: Continuing transfusion   No notable events documented.  Last Vitals:  Vitals:   11/05/21 1415 11/05/21 1430  BP: (!) 76/42 (!) 125/56  Pulse: (!) 47 (!) 52  Resp: 14 13  Temp: 37.1 C   SpO2: 99% 100%    Last Pain:  Vitals:   11/05/21 1415  TempSrc:   PainSc: 0-No pain                 Lynelle Weiler,E. Mujtaba Bollig

## 2021-11-06 ENCOUNTER — Encounter: Payer: Self-pay | Admitting: Gastroenterology

## 2021-11-06 ENCOUNTER — Other Ambulatory Visit (HOSPITAL_COMMUNITY): Payer: Self-pay

## 2021-11-06 DIAGNOSIS — M549 Dorsalgia, unspecified: Secondary | ICD-10-CM | POA: Diagnosis not present

## 2021-11-06 DIAGNOSIS — K250 Acute gastric ulcer with hemorrhage: Secondary | ICD-10-CM | POA: Diagnosis not present

## 2021-11-06 DIAGNOSIS — D62 Acute posthemorrhagic anemia: Secondary | ICD-10-CM | POA: Diagnosis not present

## 2021-11-06 DIAGNOSIS — K254 Chronic or unspecified gastric ulcer with hemorrhage: Secondary | ICD-10-CM | POA: Diagnosis not present

## 2021-11-06 DIAGNOSIS — J398 Other specified diseases of upper respiratory tract: Secondary | ICD-10-CM

## 2021-11-06 DIAGNOSIS — I2510 Atherosclerotic heart disease of native coronary artery without angina pectoris: Secondary | ICD-10-CM | POA: Diagnosis not present

## 2021-11-06 LAB — TYPE AND SCREEN
ABO/RH(D): O POS
Antibody Screen: NEGATIVE
Unit division: 0
Unit division: 0
Unit division: 0

## 2021-11-06 LAB — BASIC METABOLIC PANEL
Anion gap: 5 (ref 5–15)
BUN: 9 mg/dL (ref 8–23)
CO2: 25 mmol/L (ref 22–32)
Calcium: 8.4 mg/dL — ABNORMAL LOW (ref 8.9–10.3)
Chloride: 112 mmol/L — ABNORMAL HIGH (ref 98–111)
Creatinine, Ser: 0.62 mg/dL (ref 0.44–1.00)
GFR, Estimated: >60 mL/min (ref >60)
Glucose, Bld: 111 mg/dL — ABNORMAL HIGH (ref 70–99)
Potassium: 4.1 mmol/L (ref 3.5–5.1)
Sodium: 142 mmol/L (ref 135–145)

## 2021-11-06 LAB — CBC WITH DIFFERENTIAL/PLATELET
Abs Immature Granulocytes: 0.18 10*3/uL — ABNORMAL HIGH (ref 0.00–0.07)
Basophils Absolute: 0 10*3/uL (ref 0.0–0.1)
Basophils Relative: 1 %
Eosinophils Absolute: 0 10*3/uL (ref 0.0–0.5)
Eosinophils Relative: 0 %
HCT: 27.6 % — ABNORMAL LOW (ref 36.0–46.0)
Hemoglobin: 9.4 g/dL — ABNORMAL LOW (ref 12.0–15.0)
Immature Granulocytes: 2 %
Lymphocytes Relative: 24 %
Lymphs Abs: 1.8 10*3/uL (ref 0.7–4.0)
MCH: 31.9 pg (ref 26.0–34.0)
MCHC: 34.1 g/dL (ref 30.0–36.0)
MCV: 93.6 fL (ref 80.0–100.0)
Monocytes Absolute: 0.7 10*3/uL (ref 0.1–1.0)
Monocytes Relative: 9 %
Neutro Abs: 4.9 10*3/uL (ref 1.7–7.7)
Neutrophils Relative %: 64 %
Platelets: 123 10*3/uL — ABNORMAL LOW (ref 150–400)
RBC: 2.95 MIL/uL — ABNORMAL LOW (ref 3.87–5.11)
RDW: 18.6 % — ABNORMAL HIGH (ref 11.5–15.5)
WBC: 7.6 10*3/uL (ref 4.0–10.5)
nRBC: 0 % (ref 0.0–0.2)

## 2021-11-06 LAB — BPAM RBC
Blood Product Expiration Date: 202309272359
Blood Product Expiration Date: 202310212359
Blood Product Expiration Date: 202310212359
ISSUE DATE / TIME: 202309201030
ISSUE DATE / TIME: 202309201457
Unit Type and Rh: 5100
Unit Type and Rh: 5100
Unit Type and Rh: 9500

## 2021-11-06 LAB — SURGICAL PATHOLOGY

## 2021-11-06 LAB — MAGNESIUM: Magnesium: 2.2 mg/dL (ref 1.7–2.4)

## 2021-11-06 MED ORDER — PANTOPRAZOLE SODIUM 40 MG PO TBEC
40.0000 mg | DELAYED_RELEASE_TABLET | Freq: Two times a day (BID) | ORAL | 1 refills | Status: DC
  Filled 2021-11-06 – 2021-12-13 (×3): qty 60, 30d supply, fill #0
  Filled 2022-01-06 – 2022-01-07 (×2): qty 60, 30d supply, fill #1

## 2021-11-06 MED ORDER — CLOPIDOGREL BISULFATE 75 MG PO TABS
75.0000 mg | ORAL_TABLET | Freq: Every day | ORAL | 0 refills | Status: DC
  Filled 2021-11-06 – 2021-11-07 (×2): qty 30, 30d supply, fill #0

## 2021-11-06 NOTE — Progress Notes (Signed)
Mobility Specialist - Progress Note   11/06/21 1005  Mobility  Activity Ambulated with assistance in hallway  Level of Assistance Standby assist, set-up cues, supervision of patient - no hands on  Assistive Device None  Distance Ambulated (ft) 400 ft  Activity Response Tolerated well  $Mobility charge 1 Mobility   Pt was received in bed and agreeable to mobility. No complaints throughout ambulation. Pt was returned to EOB with all needs met.  Larey Seat

## 2021-11-06 NOTE — Telephone Encounter (Signed)
Follow up has been scheduled for 12/05/21 at 8:40 am with Dr. Marlynn Perking. Recall placed for EGD since schedule is not currently out for the time frame needed. Pt notified via mychart.

## 2021-11-06 NOTE — Progress Notes (Signed)
Progress Note  Patient Name: Beverly [name] Date of Encounter: 11/06/2021  Primary Cardiologist: Dr. Johnsie Cancel  Subjective   Patient feels well today.  She denies chest pain.  She has more energy.  She has not had a bowel movement in over 24 hours.  Inpatient Medications    Scheduled Meds:  sodium chloride   Intravenous Once   atorvastatin  40 mg Oral Daily   Buprenorphine HCl  750 mcg Buccal BID   clopidogrel  75 mg Oral Daily   gabapentin  800 mg Oral QHS   Immune Globulin (Human)-klhw  10 g Subcutaneous Q Wed   levothyroxine  25 mcg Oral Daily   methocarbamol  500 mg Oral BID   montelukast  10 mg Oral Daily   pantoprazole (PROTONIX) IV  40 mg Intravenous Q12H   PARoxetine  30 mg Oral Daily   triamcinolone  2 spray Nasal QHS   Continuous Infusions:  PRN Meds: HYDROcodone-acetaminophen, loratadine   Vital Signs    Vitals:   11/05/21 1645 11/05/21 2057 11/06/21 0019 11/06/21 0420  BP: (!) 121/51 (!) 128/50 (!) 103/52 (!) 117/52  Pulse: (!) 51 60 (!) 56 (!) 58  Resp: (!) 51 '18 18 20  '$ Temp: 98 F (36.7 C) 97.9 F (36.6 C) 98.3 F (36.8 C) 97.9 F (36.6 C)  TempSrc: Oral Oral Oral Oral  SpO2: 100% 100% 98% 100%  Weight:      Height:        Intake/Output Summary (Last 24 hours) at 11/06/2021 0881 Last data filed at 11/05/2021 1645 Gross per 24 hour  Intake 1300 ml  Output --  Net 1300 ml    I/O since admission:   Filed Weights   11/04/21 1833  Weight: 61.9 kg    Telemetry    Sinus rhythm in the 60s without ectopy- Personally Reviewed  ECG    ECG (independently read by me): Sinus bradycardia 58 bpm, first-degree AV block with PR 240 ms.  Incomplete right bundle branch block.  Physical Exam   BP (!) 117/52 (BP Location: Left Arm)   Pulse (!) 58   Temp 97.9 F (36.6 C) (Oral)   Resp 20   Ht '5\' 4"'$  (1.626 m)   Wt 61.9 kg   SpO2 100%   BMI 23.41 kg/m  General: Alert, oriented, no distress.  Skin: normal turgor, no rashes, warm and  dry HEENT: Normocephalic, atraumatic. Pupils equal round and reactive to light; sclera anicteric; extraocular muscles intact;  Nose without nasal septal hypertrophy Mouth/Parynx benign;  Neck: No JVD, no carotid bruits; normal carotid upstroke Lungs: clear to ausculatation and percussion; no wheezing or rales Chest wall: without tenderness to palpitation Heart: PMI not displaced, RRR, s1 s2 normal, 1/6 systolic murmur, no diastolic murmur, no rubs, gallops, thrills, or heaves Abdomen: soft, nontender; no hepatosplenomehaly, BS+; abdominal aorta nontender and not dilated by palpation. Back: no CVA tenderness Pulses 2+ Musculoskeletal: full range of motion, normal strength, no joint deformities Extremities: no clubbing cyanosis or edema, Homan's sign negative  Neurologic: grossly nonfocal; Cranial nerves grossly wnl Psychologic: Normal mood and affect   Labs    Chemistry Recent Labs  Lab 11/03/21 1954 11/05/21 0426 11/05/21 1432 11/06/21 0503  NA 141 143 142 142  K 3.8 3.3* 3.7 4.1  CL 107 113* 107 112*  CO2 24 23  --  25  GLUCOSE 204* 92 91 111*  BUN 37* '12 11 9  '$ CREATININE 0.62 0.60 0.40* 0.62  CALCIUM 9.1  8.5*  --  8.4*  GFRNONAA >60 >60  --  >60  ANIONGAP 10 7  --  5     Hematology Recent Labs  Lab 11/04/21 0604 11/05/21 0426 11/05/21 1432 11/05/21 1817 11/06/21 0503  WBC 8.5 6.9  --   --  7.6  RBC 2.45* 2.12*  --   --  2.95*  HGB 8.2* 7.0* 7.1* 11.0* 9.4*  HCT 24.3* 21.3* 21.0* 32.9* 27.6*  MCV 99.2 100.5*  --   --  93.6  MCH 33.5 33.0  --   --  31.9  MCHC 33.7 32.9  --   --  34.1  RDW 15.0 15.2  --   --  18.6*  PLT 156 142*  --   --  123*    Cardiac EnzymesNo results for input(s): "TROPONINI" in the last 168 hours. No results for input(s): "TROPIPOC" in the last 168 hours.   BNPNo results for input(s): "BNP", "PROBNP" in the last 168 hours.   DDimer No results for input(s): "DDIMER" in the last 168 hours.   Lipid Panel     Component Value Date/Time    CHOL 137 11/20/2020 1630   CHOL 162 01/11/2019 0815   TRIG 129.0 11/20/2020 1630   HDL 40.90 11/20/2020 1630   HDL 47 01/11/2019 0815   CHOLHDL 3 11/20/2020 1630   VLDL 25.8 11/20/2020 1630   LDLCALC 70 11/20/2020 1630   LDLCALC 85 01/11/2019 0815     Radiology    No results found.  Cardiac Studies   Relevant CV Studies:  Echo from 06/23/19:    1. Left ventricular ejection fraction, by estimation, is 55 to 60%. The  left ventricle has normal function. The left ventricle has no regional  wall motion abnormalities. There is mild left ventricular hypertrophy.  Left ventricular diastolic function  could not be evaluated.   2. Right ventricular systolic function is normal. The right ventricular  size is normal.   3. The mitral valve is grossly normal. Trivial mitral valve  regurgitation. No evidence of mitral stenosis.   4. The aortic valve has an indeterminant number of cusps. Aortic valve  regurgitation is not visualized. Mild aortic valve sclerosis is present,  with no evidence of aortic valve stenosis.   Cath: 05/19/21   Conclusions: Severe two-vessel coronary artery disease with up to 90% stenoses involving the ostial through mid LAD and ostial through proximal ramus intermedius.  Mild, nonobstructive CAD noted in the LCx and RCA. Normal left ventricular systolic function (LVEF 89-21%) with normal filling pressure (LVEDP 10 mmHg) Successful IVUS-guided PCI to ostial through mid LAD using Onyx Frontier 2.75 x 38 mm drug-eluting stent with 0% residual stenosis and TIMI-3 flow Successful PCI to ostial through proximal ramus intermedius using Onyx Frontier 2.0 x 22 mm drug-eluting stent with 0% residual stenosis and TIMI-3 flow.   Recommendations: Overnight extended recovery. Dual antiplatelet therapy with aspirin and clopidogrel, ideally for 6 months.  Nonemergent surgery will likely need to be delayed to allow for adequate antiplatelet therapy following two-vessel  PCI. Aggressive secondary prevention.    Patient Profile     Beverly [name] is a 76 y.o. female with a hx of CAD post PCI, SVT, nocturnal bradycardia, HLD, OSA, ILD with severe tracheobronchomalacia followed by pulmonary.  She was admitted with upper GI bleed and was found to have duodenitis as well as gastric ulcer and was seen 11/05/2021 for the evaluation of Brilinta use at the request of Dr Starla Link.  Assessment & Plan  Acute GI bleed and on endoscopy was found to have LA grade a reflux esophagitis with no bleeding, and a nonbleeding gastric ulcer with clean base, mild gastropathy and duodenal diverticulum by Dr. Tarri Glenn.  It welt that her ulcer was secondary to Celebrex rather than antiplatelet therapy.  Patient was treated with packed red blood cell transfusion.  Hemoglobin today 9.4 with hematocrit 27.4, slightly decreased from yesterday at 1132.9.  She has not had a bowel movement over the last 24 hours. CAD with stent DES to LAD with Onyx Frontier 2.75 x 38 mm drug-eluting stent and LCX with Onyx Frontier 2.0 x 22 mm drug-eluting stent on 05/19/21.   She remains asymptomatic.  She never experienced chest pain prior to her catheterization but this was done after a preoperative coronary CTA was worrisome for high-grade noncalcified stenoses in the LAD and ramus intermediate vessel for which she underwent successful stenting.  Following her endoscopy, she was given clearance to resume antiplatelet therapy.  However will resume with less potent clopidogrel rather than ticagrelor and will initiate today at 75 mg daily since patient is approximately 5 and half months since her two-vessel coronary intervention. Tracheobronchomalacia stable HLD continue statin; LDL cholesterol was 70 when last checked.    Hx SVT/nocturnal brady no issues lately OSA with mouth guard  Lymphocytic colitis/hypokalemia/chronic back pain.    Need for future back surgery: She will need to hold clopidogrel for at least 5  days prior to future surgery.      Patient is cardiovascular stable.  We will sign off.  Patient will follow-up with Dr. Johnsie Cancel her primary cardiologist as outpatient.    Signed, [name], MD, [facility] 11/06/2021, 9:23 AM

## 2021-11-06 NOTE — Discharge Summary (Signed)
Physician Discharge Summary  Beverly [name] YJE:[id number] DOB: [birth year] DOA: 11/03/2021  PCP: Isaac Bliss, Rayford Halsted, MD  Admit date: 11/03/2021 Discharge date: 11/06/2021  Admitted From: Home Disposition: Home  Recommendations for Outpatient Follow-up:  Follow up with PCP in 1 week with repeat CBC/BMP Outpatient follow-up with GI and cardiology Follow up in ED if symptoms worsen or new appear   Home Health: No Equipment/Devices: None  Discharge Condition: Stable CODE STATUS: Full Diet recommendation: Heart healthy  Brief/Interim Summary: 76 y.o. F with CVID, CAD s/p DES in Apr 2023, ILD with severe tracheobronchomalacia, hx SVT, bronchiectasis, lymphocytic colitis, and hypothyroidism presented with hematemesis and hematochezia.  On presentation, hemoglobin was 9.7 from baseline of 12.6 in July 2023.  CT of abdomen and pelvis showed mild distal colonic wall thickening compatible with colitis.  GI was consulted.  She underwent EGD on 11/05/2021.  She received 2 units packed red cells PRBC transfusion during the hospitalization.  Subsequently, hemoglobin is stable.  She is tolerating diet.  She has been started on Plavix as per cardiology.  GI and cardiology have cleared her for discharge.  Discharge home today on oral Protonix twice a day with outpatient follow-up with PCP/GI/cardiology.  Discharge Diagnoses:   GI bleed Acute blood loss anemia -Presented with coffee-ground emesis and clots in her stools.  Aspirin and Brilinta and Celebrex held. -CT imaging as above -Treated with IV PPI -She received 2 units packed red cells PRBC transfusion during the hospitalization.  Subsequently, hemoglobin is stable.  -underwent EGD on 11/05/2021: Showed reflux esophagitis along with nonbleeding gastric ulcer with a clean ulcer base, likely due to Celebrex.  GI recommended Protonix 40 mg twice a day for 10 weeks then once a day.  No aspirin or other NSAIDs and okay to resume Brilinta.  She  will need repeat EGD in 8 to 10 weeks as an outpatient.  Patient has been started on Plavix by cardiology. -GI and cardiology have cleared her for discharge.  Discharge home today on PPI twice a day.   CAD with history of DES to LAD on 05/19/2021 -Aspirin and Brilinta have been discontinued.  Continue Lipitor.  Cardiology has started him on Plavix which will be continued.  Outpatient follow-up with cardiology.   Lymphocytic colitis -Continue budesonide.  Outpatient follow-up with GI.   Common variable immunodeficiency -Continue IVIG weekly   Tracheobronchomalacia -Asymptomatic.  Outpatient follow-up   Leukocytosis -Resolved   Thrombocytopenia -Outpatient follow-up  Hypokalemia -Improved  Hypothyroidism--continue levothyroxine   Interstitial lung disease -Not on home oxygen.  Currently stable   Chronic back pain -Celebrex on hold.  Continue current pain management.  Outpatient follow-up with PCP/pain management   Discharge Instructions  Discharge Instructions     Ambulatory referral to Gastroenterology   Complete by: As directed    What is the reason for referral?: Other   Diet - low sodium heart healthy   Complete by: As directed    Increase activity slowly   Complete by: As directed       Allergies as of 11/06/2021       Reactions   Plavix [clopidogrel] Rash   Adhesive [tape] Rash   Codeine Rash        Medication List     STOP taking these medications    aspirin EC 81 MG tablet   Brilinta 90 MG Tabs tablet Generic drug: ticagrelor   celecoxib 200 MG capsule Commonly known as: CELEBREX   famotidine 40 MG tablet Commonly known as: PEPCID  levofloxacin 500 MG tablet Commonly known as: Levaquin   Vitamin D (Ergocalciferol) 1.25 MG (50000 UNIT) Caps capsule Commonly known as: DRISDOL       TAKE these medications    acetaminophen 500 MG tablet Commonly known as: TYLENOL Take 1,000 mg by mouth every 6 (six) hours as needed for moderate  pain.   albuterol 108 (90 Base) MCG/ACT inhaler Commonly known as: Ventolin HFA Inhale 2 puffs into the lungs every 6 (six) hours as needed for wheezing or shortness of breath.   atorvastatin 40 MG tablet Commonly known as: LIPITOR Take 1 tablet by mouth daily.   Belbuca 750 MCG Film Generic drug: Buprenorphine HCl Place 1 film to inside of mouth every 12 hours   budesonide 3 MG 24 hr capsule Commonly known as: ENTOCORT EC Take 3 capsules (9 mg total) by mouth daily for 28 days, THEN 2 capsules (6 mg total) daily for 56 days, THEN 1 capsule (3 mg total) daily. Start taking on: August 05, 2021   Calcium Carbonate-Vitamin D 600-200 MG-UNIT Tabs Take 1 tablet by mouth 2 (two) times daily.   clopidogrel 75 MG tablet Commonly known as: PLAVIX Take 1 tablet (75 mg total) by mouth daily. Start taking on: November 08, 2021   colestipol 1 g tablet Commonly known as: COLESTID Take 1 tablet by mouth daily.   Culturelle Adult Ult Balance Caps Take 1 capsule by mouth daily.   diclofenac Sodium 1 % Gel Commonly known as: VOLTAREN Apply 2 g topically 4 (four) times daily as needed (joint pain).   Docusate Sodium 100 MG capsule Take 100 mg by mouth daily.   EPINEPHrine 0.3 mg/0.3 mL Soaj injection Commonly known as: EPI-PEN Inject 0.3 mg into the muscle as needed for anaphylaxis.   gabapentin 800 MG tablet Commonly known as: NEURONTIN Take 1 tablet by mouth at bedtime. What changed:  how much to take how to take this when to take this   Guaifenesin 1200 MG Tb12 Take 1,200 mg by mouth 2 (two) times daily.   HYDROcodone-acetaminophen 7.5-325 MG tablet Commonly known as: NORCO Take 1 tablet by mouth four times a day as needed for pain   ipratropium 0.03 % nasal spray Commonly known as: ATROVENT Place 1 spray into both nostrils 3 (three) times daily.   levothyroxine 25 MCG tablet Commonly known as: SYNTHROID Take 1 tablet (25 mcg total) by mouth daily.   loratadine 10  MG tablet Commonly known as: Claritin Take 1 tablet (10 mg total) by mouth daily as needed for allergies (Can take an extra dose during flare ups.).   MELATIN PO Take 5 mg by mouth at bedtime.   methocarbamol 500 MG tablet Commonly known as: ROBAXIN Take 1 tablet by mouth three times a day as needed What changed:  how much to take how to take this when to take this   montelukast 10 MG tablet Commonly known as: SINGULAIR Take 1 tablet (10 mg total) by mouth at bedtime. What changed: when to take this   multivitamin with minerals tablet Take 1 tablet by mouth daily.   nitroGLYCERIN 0.4 MG SL tablet Commonly known as: Nitrostat Place 1 tablet (0.4 mg total) under the tongue every 5 (five) minutes as needed.   omega-3 acid ethyl esters 1 g capsule Commonly known as: LOVAZA TAKE 2 CAPSULES BY MOUTH 2 TIMES DAILY What changed:  how much to take how to take this when to take this   ondansetron 4 MG disintegrating tablet Commonly known  as: ZOFRAN-ODT Take 1 tablet by mouth every 6 (six) hours as needed for nausea or vomiting.   pantoprazole 40 MG tablet Commonly known as: PROTONIX Take 1 tablet (40 mg total) by mouth 2 (two) times daily before a meal. 40 milligram twice daily for 10 weeks then once a day as per GI recommendations What changed:  when to take this additional instructions   PARoxetine 30 MG tablet Commonly known as: PAXIL Take 1 tablet by mouth once a day. What changed:  how much to take how to take this when to take this   [place] 4 % Crea Generic drug: Fluorouracil Apply to aa total 28 applications & sun protect   triamcinolone 55 MCG/ACT Aero nasal inhaler Commonly known as: NASACORT Place 2 sprays into each nostril once daily.   Xembify 10 GM/50ML Soln Generic drug: Immune Globulin (Human)-klhw Inject 10 g into the skin once a week. Wednesday        Follow-up Information     Isaac Bliss, Rayford Halsted, MD. Schedule an appointment as soon  as possible for a visit in 1 week(s).   Specialty: Internal Medicine Why: With repeat CBC/BMP Contact information: [place] [address] [phone]         Josue [name], MD .   Specialty: Cardiology Contact information: [phone] [place] [address] [phone]                Allergies  Allergen Reactions   Plavix [Clopidogrel] Rash   Adhesive [Tape] Rash   Codeine Rash    Consultations: GI/cardiology   Procedures/Studies: CT ABDOMEN PELVIS W CONTRAST  Result Date: 11/03/2021 CLINICAL DATA:  Rectal bleeding, hematemesis, recent diagnosis of colitis EXAM: CT ABDOMEN AND PELVIS WITH CONTRAST TECHNIQUE: Multidetector CT imaging of the abdomen and pelvis was performed using the standard protocol following bolus administration of intravenous contrast. RADIATION DOSE REDUCTION: This exam was performed according to the departmental dose-optimization program which includes automated exposure control, adjustment of the mA and/or kV according to patient size and/or use of iterative reconstruction technique. CONTRAST:  53m OMNIPAQUE IOHEXOL 350 MG/ML SOLN COMPARISON:  None Available. FINDINGS: Lower chest: No acute pleural or parenchymal lung disease. Hepatobiliary: Mild hepatic steatosis. No focal liver abnormality. Cholecystectomy. Pancreas: Unremarkable. No pancreatic ductal dilatation or surrounding inflammatory changes. Spleen: Normal in size without focal abnormality. Adrenals/Urinary Tract: Adrenal glands are unremarkable. Kidneys are normal, without renal calculi, focal lesion, or hydronephrosis. Bladder is unremarkable. Stomach/Bowel: No bowel obstruction or ileus. There is mild diffuse colonic wall thickening from the splenic flexure through the rectum, compatible with given history of recent colitis. Normal appendix right lower quadrant. Vascular/Lymphatic: Aortic atherosclerosis. No enlarged abdominal or pelvic lymph  nodes. Reproductive: Status post hysterectomy. No adnexal masses. Other: No free fluid or free intraperitoneal gas. No abdominal wall hernia. Musculoskeletal: No acute or destructive bony lesions. Multilevel lumbar spondylosis and facet hypertrophy, with grade 1 degenerative anterolisthesis of L4 on L5. Reconstructed images demonstrate no additional findings. IMPRESSION: 1. Mild distal colonic wall thickening compatible with colitis. 2.  Aortic Atherosclerosis (ICD10-I70.0). Electronically Signed   By: MRanda NgoM.D.   On: 11/03/2021 23:12   CT ANGIO HEAD W OR WO CONTRAST  Result Date: 10/17/2021 CLINICAL DATA:  Neuro deficit, persistent/recurrent, CNS neoplasm suspected Amaurosis Fugax; Carotid artery stenosis 3 episodes of amaurosis fugax EXAM: CT ANGIOGRAPHY HEAD AND NECK TECHNIQUE: Multidetector CT imaging of the head and neck was performed using the standard protocol during  bolus administration of intravenous contrast. Multiplanar CT image reconstructions and MIPs were obtained to evaluate the vascular anatomy. Carotid stenosis measurements (when applicable) are obtained utilizing NASCET criteria, using the distal internal carotid diameter as the denominator. RADIATION DOSE REDUCTION: This exam was performed according to the departmental dose-optimization program which includes automated exposure control, adjustment of the mA and/or kV according to patient size and/or use of iterative reconstruction technique. CONTRAST:  14m ISOVUE-370 IOPAMIDOL (ISOVUE-370) INJECTION 76% Creatinine was obtained on site at [facility] [address] Results: Creatinine 0.6 mg/dL. COMPARISON:  None Available. FINDINGS: CT HEAD Brain: There is no acute intracranial hemorrhage, mass effect, or edema. Gray-white differentiation is preserved. There is no extra-axial fluid collection. Ventricles and sulci are within normal limits in size and configuration. Vascular: Better evaluated on CTA portion. Skull:  Calvarium is unremarkable. Sinuses/Orbits: No acute finding. Other: None. Review of the MIP images confirms the above findings CTA NECK Aortic arch: Mild calcified plaque. Great vessel origins are patent. Right carotid system: Patent. Minor mixed plaque at the bifurcation and proximal internal carotid without stenosis. Left carotid system: Patent. Mild calcified plaque along the proximal internal carotid with minimal stenosis. Vertebral arteries: Patent and codominant.  No stenosis. Skeleton: Advanced degenerative changes of the cervical spine and temporomandibular joints. Other neck: Unremarkable. Upper chest: Included lungs are clear. Review of the MIP images confirms the above findings CTA HEAD Anterior circulation: The intracranial internal carotid arteries are patent with mild calcified plaque. Anterior and middle cerebral arteries are patent. Posterior circulation: Intracranial vertebral arteries are patent. Basilar artery is patent. Major cerebellar artery origins are patent. Left larger than right posterior communicating arteries are present. Posterior cerebral arteries are patent with fetal origin on the left. Venous sinuses: Patent as allowed by contrast bolus timing. Review of the MIP images confirms the above findings IMPRESSION: No acute intracranial abnormality. Mild plaque without hemodynamically significant stenosis. Electronically Signed   By: PMacy MisM.D.   On: 10/17/2021 09:40   CT ANGIO NECK W OR WO CONTRAST  Result Date: 10/17/2021 CLINICAL DATA:  Neuro deficit, persistent/recurrent, CNS neoplasm suspected Amaurosis Fugax; Carotid artery stenosis 3 episodes of amaurosis fugax EXAM: CT ANGIOGRAPHY HEAD AND NECK TECHNIQUE: Multidetector CT imaging of the head and neck was performed using the standard protocol during bolus administration of intravenous contrast. Multiplanar CT image reconstructions and MIPs were obtained to evaluate the vascular anatomy. Carotid stenosis measurements  (when applicable) are obtained utilizing NASCET criteria, using the distal internal carotid diameter as the denominator. RADIATION DOSE REDUCTION: This exam was performed according to the departmental dose-optimization program which includes automated exposure control, adjustment of the mA and/or kV according to patient size and/or use of iterative reconstruction technique. CONTRAST:  859mISOVUE-370 IOPAMIDOL (ISOVUE-370) INJECTION 76% Creatinine was obtained on site at [facility] [address] Results: Creatinine 0.6 mg/dL. COMPARISON:  None Available. FINDINGS: CT HEAD Brain: There is no acute intracranial hemorrhage, mass effect, or edema. Gray-white differentiation is preserved. There is no extra-axial fluid collection. Ventricles and sulci are within normal limits in size and configuration. Vascular: Better evaluated on CTA portion. Skull: Calvarium is unremarkable. Sinuses/Orbits: No acute finding. Other: None. Review of the MIP images confirms the above findings CTA NECK Aortic arch: Mild calcified plaque. Great vessel origins are patent. Right carotid system: Patent. Minor mixed plaque at the bifurcation and proximal internal carotid without stenosis. Left carotid system: Patent. Mild calcified plaque along the proximal internal carotid with minimal  stenosis. Vertebral arteries: Patent and codominant.  No stenosis. Skeleton: Advanced degenerative changes of the cervical spine and temporomandibular joints. Other neck: Unremarkable. Upper chest: Included lungs are clear. Review of the MIP images confirms the above findings CTA HEAD Anterior circulation: The intracranial internal carotid arteries are patent with mild calcified plaque. Anterior and middle cerebral arteries are patent. Posterior circulation: Intracranial vertebral arteries are patent. Basilar artery is patent. Major cerebellar artery origins are patent. Left larger than right posterior communicating arteries are present.  Posterior cerebral arteries are patent with fetal origin on the left. Venous sinuses: Patent as allowed by contrast bolus timing. Review of the MIP images confirms the above findings IMPRESSION: No acute intracranial abnormality. Mild plaque without hemodynamically significant stenosis. Electronically Signed   By: Macy Mis M.D.   On: 10/17/2021 09:40    Impression:               - LA Grade A reflux esophagitis with no bleeding.                           - Non-bleeding gastric ulcer with a clean ulcer                            base (Forrest Class III). Likely due to Celebrex.                           - Mild gastropathy. Biopsied.                           - Duodenal diverticulum. Recommendation:           - Return patient to hospital ward for ongoing care.                           - Resume previous diet.                           - Continue present medications.                           - Pantoprazole 40 mg BID x 10 weeks, then 40 mg QAM.                           - Await pathology results.                           - Okay to continue Brilinta at this time.                           - No aspirin, ibuprofen, naproxen, or other                            non-steroidal anti-inflammatory drugs including                            Celebrex.                           - Repeat EGD in  8-10 weeks with Dr. Bryan Lemma to                            document healing.                           No additional inpatient GI evaluation planned at                            this time. Please call the on-call                            gastroenterologist with any additional questions or                            concerns during this hospitalization.  Subjective: Patient seen and examined at bedside.  Tolerating diet.  No black or bloody bowel movements.  No fever, worsening shortness of breath reported.  Discharge Exam: Vitals:   11/06/21 0019 11/06/21 0420  BP: (!) 103/52 (!) 117/52  Pulse: (!) 56  (!) 58  Resp: 18 20  Temp: 98.3 F (36.8 C) 97.9 F (36.6 C)  SpO2: 98% 100%    General: Pt is alert, awake, not in acute distress.  Currently on room air Cardiovascular: Mildly bradycardic; S1/S2 + Respiratory: bilateral decreased breath sounds at bases Abdominal: Soft, NT, ND, bowel sounds + Extremities: no edema, no cyanosis    The results of significant diagnostics from this hospitalization (including imaging, microbiology, ancillary and laboratory) are listed below for reference.     Microbiology: No results found for this or any previous visit (from the past 240 hour(s)).   Labs: BNP (last 3 results) No results for input(s): "BNP" in the last 8760 hours. Basic Metabolic Panel: Recent Labs  Lab 11/03/21 1954 11/05/21 0426 11/05/21 1432 11/06/21 0503  NA 141 143 142 142  K 3.8 3.3* 3.7 4.1  CL 107 113* 107 112*  CO2 24 23  --  25  GLUCOSE 204* 92 91 111*  BUN 37* '12 11 9  '$ CREATININE 0.62 0.60 0.40* 0.62  CALCIUM 9.1 8.5*  --  8.4*  MG  --   --   --  2.2   Liver Function Tests: No results for input(s): "AST", "ALT", "ALKPHOS", "BILITOT", "PROT", "ALBUMIN" in the last 168 hours. No results for input(s): "LIPASE", "AMYLASE" in the last 168 hours. No results for input(s): "AMMONIA" in the last 168 hours. CBC: Recent Labs  Lab 11/03/21 1954 11/04/21 0604 11/05/21 0426 11/05/21 1432 11/05/21 1817 11/06/21 0503  WBC 14.5* 8.5 6.9  --   --  7.6  NEUTROABS 12.0*  --   --   --   --  4.9  HGB 9.7* 8.2* 7.0* 7.1* 11.0* 9.4*  HCT 30.0* 24.3* 21.3* 21.0* 32.9* 27.6*  MCV 102.0* 99.2 100.5*  --   --  93.6  PLT 188 156 142*  --   --  123*   Cardiac Enzymes: No results for input(s): "CKTOTAL", "CKMB", "CKMBINDEX", "TROPONINI" in the last 168 hours. BNP: Invalid input(s): "POCBNP" CBG: No results for input(s): "GLUCAP" in the last 168 hours. D-Dimer No results for input(s): "DDIMER" in the last 72 hours. Hgb A1c Recent Labs    11/05/21 0426  HGBA1C 4.9    Lipid Profile No results for input(s): "  CHOL", "HDL", "LDLCALC", "TRIG", "CHOLHDL", "LDLDIRECT" in the last 72 hours. Thyroid function studies No results for input(s): "TSH", "T4TOTAL", "T3FREE", "THYROIDAB" in the last 72 hours.  Invalid input(s): "FREET3" Anemia work up No results for input(s): "VITAMINB12", "FOLATE", "FERRITIN", "TIBC", "IRON", "RETICCTPCT" in the last 72 hours. Urinalysis    Component Value Date/Time   LABSPEC 1.020 06/10/2017 1446   BILIRUBINUR negative 06/10/2017 1446   BILIRUBINUR 2+ 03/12/2016 0856   KETONESUR negative 06/10/2017 1446   PROTEINUR negative 06/10/2017 1446   PROTEINUR trace 03/12/2016 0856   UROBILINOGEN 0.2 03/12/2016 0856   NITRITE Negative 06/10/2017 1446   NITRITE neg 03/12/2016 0856   LEUKOCYTESUR Negative 06/10/2017 1446   Sepsis Labs Recent Labs  Lab 11/03/21 1954 11/04/21 0604 11/05/21 0426 11/06/21 0503  WBC 14.5* 8.5 6.9 7.6   Microbiology No results found for this or any previous visit (from the past 240 hour(s)).   Time coordinating discharge: 35 minutes  SIGNED:   Aline August, MD  [facility] 11/06/2021, 9:54 AM

## 2021-11-07 ENCOUNTER — Other Ambulatory Visit (HOSPITAL_COMMUNITY): Payer: Self-pay

## 2021-11-10 ENCOUNTER — Encounter: Payer: Self-pay | Admitting: Internal Medicine

## 2021-11-10 ENCOUNTER — Other Ambulatory Visit (HOSPITAL_COMMUNITY): Payer: Self-pay

## 2021-11-10 ENCOUNTER — Telehealth: Payer: Self-pay | Admitting: *Deleted

## 2021-11-10 ENCOUNTER — Ambulatory Visit (INDEPENDENT_AMBULATORY_CARE_PROVIDER_SITE_OTHER): Payer: Medicare PPO | Admitting: Internal Medicine

## 2021-11-10 ENCOUNTER — Inpatient Hospital Stay: Payer: Medicare PPO | Admitting: Internal Medicine

## 2021-11-10 ENCOUNTER — Other Ambulatory Visit: Payer: Self-pay | Admitting: Internal Medicine

## 2021-11-10 VITALS — BP 130/70 | HR 62 | Temp 98.6°F | Wt 140.3 lb

## 2021-11-10 DIAGNOSIS — D62 Acute posthemorrhagic anemia: Secondary | ICD-10-CM | POA: Diagnosis not present

## 2021-11-10 DIAGNOSIS — Z09 Encounter for follow-up examination after completed treatment for conditions other than malignant neoplasm: Secondary | ICD-10-CM

## 2021-11-10 DIAGNOSIS — K250 Acute gastric ulcer with hemorrhage: Secondary | ICD-10-CM | POA: Diagnosis not present

## 2021-11-10 DIAGNOSIS — K254 Chronic or unspecified gastric ulcer with hemorrhage: Secondary | ICD-10-CM

## 2021-11-10 DIAGNOSIS — E559 Vitamin D deficiency, unspecified: Secondary | ICD-10-CM | POA: Diagnosis not present

## 2021-11-10 DIAGNOSIS — E039 Hypothyroidism, unspecified: Secondary | ICD-10-CM

## 2021-11-10 LAB — CBC WITH DIFFERENTIAL/PLATELET
Basophils Absolute: 0 10*3/uL (ref 0.0–0.1)
Basophils Relative: 0.6 % (ref 0.0–3.0)
Eosinophils Absolute: 0 10*3/uL (ref 0.0–0.7)
Eosinophils Relative: 0.7 % (ref 0.0–5.0)
HCT: 33 % — ABNORMAL LOW (ref 36.0–46.0)
Hemoglobin: 11.2 g/dL — ABNORMAL LOW (ref 12.0–15.0)
Lymphocytes Relative: 18.8 % (ref 12.0–46.0)
Lymphs Abs: 1.1 10*3/uL (ref 0.7–4.0)
MCHC: 33.8 g/dL (ref 30.0–36.0)
MCV: 93.4 fl (ref 78.0–100.0)
Monocytes Absolute: 0.5 10*3/uL (ref 0.1–1.0)
Monocytes Relative: 8.1 % (ref 3.0–12.0)
Neutro Abs: 4.3 10*3/uL (ref 1.4–7.7)
Neutrophils Relative %: 71.8 % (ref 43.0–77.0)
Platelets: 157 10*3/uL (ref 150.0–400.0)
RBC: 3.54 Mil/uL — ABNORMAL LOW (ref 3.87–5.11)
RDW: 17.9 % — ABNORMAL HIGH (ref 11.5–15.5)
WBC: 6 10*3/uL (ref 4.0–10.5)

## 2021-11-10 LAB — VITAMIN D 25 HYDROXY (VIT D DEFICIENCY, FRACTURES): VITD: 31.65 ng/mL (ref 30.00–100.00)

## 2021-11-10 NOTE — Addendum Note (Signed)
Encounter addended by: [place], Bibiana Gillean G, RD on: 11/10/2021 4:29 PM  Actions taken: Flowsheet accepted

## 2021-11-10 NOTE — Telephone Encounter (Signed)
Transition Care Management Follow-up Telephone Call  Date discharged?  How have you been since you were released from the hospital? Better but weak  Do you understand why you were in the hospital? yes  Do you understand the discharge instructions? yes  Where were you discharged to? home   Items Reviewed: Medications reviewed: yes Allergies reviewed: yes Dietary changes reviewed: yes Referrals reviewed: yes   Functional Questionnaire:  Activities of Daily Living (ADLs):   She states they are independent in the following:  all States they require assistance with the following:  none  Any transportation issues/concerns?: no  Any patient concerns? no  Confirmed importance and date/time of follow-up visits scheduled yes Provider Appointment booked with Dr Jerilee Hoh 11/10/21  Confirmed with patient if condition begins to worsen call PCP or go to the ER.  Patient was given the office number and encouraged to call back with question or concerns.  : yes

## 2021-11-10 NOTE — Progress Notes (Signed)
Hospital follow-up visit     CC/Reason for Visit: Hospitalization follow-up  HPI: Beverly [name] is a 76 y.o. female who is coming in today for the above mentioned reasons, specifically transitional care services face-to-face visit.    Dates hospitalized: 11/03/2021 - 11/06/2021 Days since discharge from hospital: 4 Patient was discharged from the hospital to: Home Reason for admission to hospital: Hematemesis Date of interactive phone contact with patient and/or caregiver: 11/10/2021  I have reviewed in detail patient's discharge summary plus pertinent specific notes, labs, and images from the hospitalization.  Yes  On day of admission she had been feeling fatigued.  That evening she had large-volume hematemesis and hematochezia that prompted her to dial 911 she was transported to the hospital.  She was placed on IV PPI therapy, given 2 units of PRBCs.  She had an emergent EGD which showed reflux esophagitis and a nonbleeding gastric ulcer with clean base.  Aspirin and Brilinta have been discontinued.  She has been continued on Plavix alone.  It is thought that the Celebrex that she is on for her arthritis might have been the culprit.  She is now off all NSAIDs.  She is still feeling a little fatigued but back to baseline.  Medication reconciliation was done today and patient is taking meds as recommended by discharging hospitalist/specialist.  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   right sholder   BCC (basal cell carcinoma of skin) 05/09/2014   mid lower back   BCC (basal cell carcinoma of skin) 05/03/2017   right low back   BCC (basal cell carcinoma of skin) 05/03/2017   left upper  back   BCC (basal cell carcinoma of skin) 07/05/2018   left mid back   BCC (basal cell carcinoma of skin) 05/20/1992   upper back   BCC (basal cell carcinoma of skin) 07/29/1993   left sholder medial   BCC (basal cell carcinoma of skin) 07/29/1993   left sholder lateral   BCC (basal cell carcinoma of skin) 07/29/1993   right thigh   BCC (basal cell carcinoma of skin) 07/29/1993   right sholder   BCC (basal cell carcinoma of skin) 12/22/1994   right mid forearm   BCC (basal cell carcinoma of skin) 12/22/1994   right upper forearm   BCC (basal cell carcinoma of skin) 12/22/1994   lower right upper arm   BCC (basal cell carcinoma of skin) 12/22/1994   right upper arm sholder   BCC (basal cell carcinoma of skin) 08/11/1995   left leg below knee   BCC (basal cell carcinoma of skin) 04/11/2002   mid back   BCC (basal cell carcinoma of skin) 12/10/2002   right center upper back   BCC (basal cell carcinoma of skin) 05/26/2005   right post sholder   BCC (basal cell carcinoma) 05/09/2014   left inner shin   BCC (basal cell carcinoma) 06/12/2014   left forearm   Bowen's disease 10/07/1994   right post knee, right inner forearm/wrist   Bowen's disease 08/11/1995   left sholder   Bronchiectasis ([place])    Cataract    left  Chronic pain    Common variable immunodeficiency [facility])    sees dr Gayleen Orem 02-11-2021 epic   Coronary artery disease    Depression    hx of   Diverticulosis of colon 1998   mild   DJD (degenerative joint disease)    Duodenal ulcer    h/o yrs ago   GERD (gastroesophageal reflux disease)    History of SCC (squamous cell carcinoma) of skin    Dr. Denna Haggard   History of sinus bradycardia    HLD (hyperlipidemia)    hypertriglyceridemia   Hypertensive retinopathy of both eyes 11/18/2017   Hypothyroidism    IBS (irritable bowel syndrome)    Internal hemorrhoids 1998   Lymphocytic colitis    Mitral regurgitation    mild   Nocardiosis    relased by  infection disease dec 2022   Osteoarthritis    feet,shoulder,neck,back,hips and hands.   Pneumonia 2015   PONV (postoperative nausea and vomiting)    Rotator cuff tear, right 02/2019   infraspinatus and supraspinatus, and dislocation of long head of bicep tendon (Dr. Alvan Dame)   SCC (squamous cell carcinoma) 11/26/2014   left hand, right hand, right deltoid mnay areas   SCC (squamous cell carcinoma) 05/03/2017   left cheek   Sleep apnea    uses a mouth guard nightly   Squamous cell carcinoma in situ (SCCIS) 07/05/2018   left hand   Tracheobronchomalacia    Vitamin D deficiency    mild    Past Surgical History:  Procedure Laterality Date   ABDOMINAL HYSTERECTOMY  1998   complete   BIOPSY  11/05/2021   Procedure: BIOPSY;  Surgeon: Thornton Park, MD;  Location: [facility] ENDOSCOPY;  Service: Gastroenterology;;   BLEPHAROPLASTY Bilateral 01/2018   BROW LIFT Bilateral 10/10/2018   Procedure: Delphina Cahill LIFT;  Surgeon: Wallace Going, DO;  Location: [place];  Service: Plastics;  Laterality: Bilateral;   BUNIONECTOMY     R 12/08, L 2004 (Dr. Janus Molder)   [place]   bilateral   CARPAL TUNNEL RELEASE Left 03/06/2021   Procedure: Left Revision Carpal Tunnel Release with hypothenar fat pad flap;  Surgeon: [name], MD;  Location: [place];  Service: Orthopedics;  Laterality: Left;  with local anesthesia   CATARACT EXTRACTION Bilateral Left in 08/2012, Right 04/2016   Dr.Hecker   CESAREAN SECTION     1981 and [place]   laparoscopic   CLOSED REDUCTION NASAL FRACTURE N/A 08/29/2018   Procedure: CLOSED REDUCTION NASAL FRACTURE;  Surgeon: Wallace Going, DO;  Location: [place];  Service: Plastics;  Laterality: N/A;  1 hour, please   COLONOSCOPY  2006   COLONOSCOPY  01/2012   due again 01/2021; mild diverticulosis   CORONARY STENT INTERVENTION N/A 05/19/2021    Procedure: CORONARY STENT INTERVENTION;  Surgeon: Nelva Bush, MD;  Location: [place] CV LAB;  Service: Cardiovascular;  Laterality: N/A;   epidural steroid injection, back  02/2010   ESOPHAGOGASTRODUODENOSCOPY (EGD) WITH PROPOFOL N/A 11/05/2021   Procedure: ESOPHAGOGASTRODUODENOSCOPY (EGD) WITH PROPOFOL;  Surgeon: Thornton Park, MD;  Location: [place];  Service: Gastroenterology;  Laterality: N/A;   HIP SURGERY     right bursectomy x 3   HIP SURGERY Right 2000   torn cartilage, repaired   INGUINAL HERNIA REPAIR  10/2007   bilat   INTRAVASCULAR ULTRASOUND/IVUS N/A 05/19/2021   Procedure: Intravascular Ultrasound/IVUS;  Surgeon: End,  Harrell Gave, MD;  Location: [place] CV LAB;  Service: Cardiovascular;  Laterality: N/A;   LEFT HEART CATH AND CORONARY ANGIOGRAPHY N/A 05/19/2021   Procedure: LEFT HEART CATH AND CORONARY ANGIOGRAPHY;  Surgeon: Nelva Bush, MD;  Location: [place] CV [facility];  Service: Cardiovascular;  Laterality: N/A;   NECK SURGERY  1989   c6-7 cervical laminectomy and diskecotmy   REVERSE SHOULDER ARTHROPLASTY Right 04/11/2019   Procedure: REVERSE SHOULDER ARTHROPLASTY;  Surgeon: Nicholes Stairs, MD;  Location: [place];  Service: Orthopedics;  Laterality: Right;  [place]   SHOULDER SURGERY Left 03/2003   left rotator cuff repair   SHOULDER SURGERY Left 03/14/2018   rotator cuff repair; Dr. Tonita Cong   TONSILLECTOMY  age 36   TROCHANTERIC [place] EXTRACTION     as teenager    Social History:  reports that she has never smoked. She has never used smokeless tobacco. She reports current alcohol use of about 7.0 standard drinks of alcohol per week. She reports that she does not use drugs.  Allergies: Allergies  Allergen Reactions   Adhesive [Tape] Rash   Codeine Rash    Family History:  Family History  Problem Relation Age of Onset   Depression Mother    Schizophrenia Mother     Cancer Father        oral   Heart disease Father        bradycardia   Esophageal cancer Father    Cancer Sister        skin and lung   COPD Sister    Hypertension Sister    Osteoporosis Sister        compression fx's x 3 11/2018   Hyperthyroidism Brother    Cancer Brother        metastatic cancer to bone (?primary)   Diabetes Maternal Grandmother    Cancer Paternal Grandmother 38       colon cancer, metastatic to liver   Colon cancer Paternal Grandmother    Liver cancer Paternal Grandmother    Hyperlipidemia Daughter    Asthma Son    ADD / ADHD Other      Current Outpatient Medications:    acetaminophen (TYLENOL) 500 MG tablet, Take 1,000 mg by mouth every 6 (six) hours as needed for moderate pain. , Disp: , Rfl:    albuterol (VENTOLIN HFA) 108 (90 Base) MCG/ACT inhaler, Inhale 2 puffs into the lungs every 6 (six) hours as needed for wheezing or shortness of breath., Disp: 18 g, Rfl: 1   atorvastatin (LIPITOR) 40 MG tablet, Take 1 tablet by mouth daily., Disp: 90 tablet, Rfl: 1   budesonide (ENTOCORT EC) 3 MG 24 hr capsule, Take 3 capsules (9 mg total) by mouth daily for 28 days, THEN 2 capsules (6 mg total) daily for 56 days, THEN 1 capsule (3 mg total) daily., Disp: 252 capsule, Rfl: 0   Buprenorphine HCl (BELBUCA) 750 MCG FILM, Place 1 film to inside of mouth every 12 hours, Disp: 60 each, Rfl: 2   Calcium Carbonate-Vitamin D 600-200 MG-UNIT TABS, Take 1 tablet by mouth 2 (two) times daily., Disp: , Rfl:    clopidogrel (PLAVIX) 75 MG tablet, Take 1 tablet (75 mg total) by mouth daily., Disp: 30 tablet, Rfl: 0   colestipol (COLESTID) 1 g tablet, Take 1 tablet by mouth daily., Disp: 90 tablet, Rfl: 1   diclofenac Sodium (VOLTAREN) 1 % GEL, Apply 2 g topically  4 (four) times daily as needed (joint pain)., Disp: , Rfl:    Docusate Sodium 100 MG capsule, Take 100 mg by mouth daily., Disp: , Rfl:    EPINEPHrine 0.3 mg/0.3 mL IJ SOAJ injection, Inject 0.3 mg into the muscle as  needed for anaphylaxis., Disp: , Rfl:    Fluorouracil (TOLAK) 4 % CREA, Apply to aa total 28 applications & sun protect, Disp: 40 g, Rfl: 0   gabapentin (NEURONTIN) 800 MG tablet, Take 1 tablet by mouth at bedtime. (Patient taking differently: Take 800 mg by mouth at bedtime.), Disp: 90 tablet, Rfl: 2   Guaifenesin 1200 MG TB12, Take 1,200 mg by mouth 2 (two) times daily., Disp: , Rfl:    HYDROcodone-acetaminophen (NORCO) 7.5-325 MG tablet, Take 1 tablet by mouth four times a day as needed for pain, Disp: 100 tablet, Rfl: 0   ipratropium (ATROVENT) 0.03 % nasal spray, Place 1 spray into both nostrils 3 (three) times daily., Disp: 30 mL, Rfl: 5   Lactobacillus-Inulin (CULTURELLE ADULT ULT BALANCE) CAPS, Take 1 capsule by mouth daily., Disp: , Rfl:    levothyroxine (SYNTHROID) 25 MCG tablet, Take 1 tablet (25 mcg total) by mouth daily., Disp: 90 tablet, Rfl: 1   loratadine (CLARITIN) 10 MG tablet, Take 1 tablet (10 mg total) by mouth daily as needed for allergies (Can take an extra dose during flare ups.)., Disp: 60 tablet, Rfl: 5   Melatonin-Pyridoxine (MELATIN PO), Take 5 mg by mouth at bedtime., Disp: , Rfl:    methocarbamol (ROBAXIN) 500 MG tablet, Take 1 tablet by mouth three times a day as needed (Patient taking differently: Take 500 mg by mouth 2 (two) times daily.), Disp: 270 tablet, Rfl: 2   montelukast (SINGULAIR) 10 MG tablet, Take 1 tablet (10 mg total) by mouth at bedtime. (Patient taking differently: Take 10 mg by mouth daily.), Disp: 90 tablet, Rfl: 1   Multiple Vitamins-Minerals (MULTIVITAMIN WITH MINERALS) tablet, Take 1 tablet by mouth daily., Disp: , Rfl:    nitroGLYCERIN (NITROSTAT) 0.4 MG SL tablet, Place 1 tablet (0.4 mg total) under the tongue every 5 (five) minutes as needed., Disp: 25 tablet, Rfl: 2   omega-3 acid ethyl esters (LOVAZA) 1 g capsule, TAKE 2 CAPSULES BY MOUTH 2 TIMES DAILY (Patient taking differently: Take 2 g by mouth 2 (two) times daily.), Disp: 360 capsule, Rfl:  0   ondansetron (ZOFRAN-ODT) 4 MG disintegrating tablet, Take 1 tablet by mouth every 6 (six) hours as needed for nausea or vomiting., Disp: 30 tablet, Rfl: 1   pantoprazole (PROTONIX) 40 MG tablet, Take 1 tablet (40 mg) twice daily for 10 weeks then once a day as per GI recommendations, Disp: 60 tablet, Rfl: 1   PARoxetine (PAXIL) 30 MG tablet, Take 1 tablet by mouth once a day. (Patient taking differently: Take 30 mg by mouth at bedtime.), Disp: 90 tablet, Rfl: 1   triamcinolone (NASACORT) 55 MCG/ACT AERO nasal inhaler, Place 2 sprays into each nostril once daily., Disp: 16.5 g, Rfl: 5   XEMBIFY 10 GM/50ML SOLN, Inject 10 g into the skin once a week. Wednesday, Disp: , Rfl:   Review of Systems:  Constitutional: Denies fever, chills, diaphoresis, appetite change. HEENT: Denies photophobia, eye pain, redness, hearing loss, ear pain, congestion, sore throat, rhinorrhea, sneezing, mouth sores, trouble swallowing, neck pain, neck stiffness and tinnitus.   Respiratory: Denies SOB, DOE, cough, chest tightness,  and wheezing.   Cardiovascular: Denies chest pain, palpitations and leg swelling.  Gastrointestinal: Denies nausea, vomiting,  abdominal pain, diarrhea, constipation, blood in stool and abdominal distention.  Genitourinary: Denies dysuria, urgency, frequency, hematuria, flank pain and difficulty urinating.  Endocrine: Denies: hot or cold intolerance, sweats, changes in hair or nails, polyuria, polydipsia. Musculoskeletal: Denies myalgias, back pain, joint swelling, arthralgias and gait problem.  Skin: Denies pallor, rash and wound.  Neurological: Denies dizziness, seizures, syncope, weakness, light-headedness, numbness and headaches.  Hematological: Denies adenopathy. Easy bruising, personal or family bleeding history  Psychiatric/Behavioral: Denies suicidal ideation, mood changes, confusion, nervousness, sleep disturbance and agitation    Physical Exam: Vitals:   11/10/21 1302  BP: 130/70   Pulse: 62  Temp: 98.6 F (37 C)  TempSrc: Oral  SpO2: 98%  Weight: 140 lb 4.8 oz (63.6 kg)    Body mass index is 24.08 kg/m.   Constitutional: NAD, calm, comfortable Eyes: PERRL, lids and conjunctivae normal ENMT: Mucous membranes are moist.  Respiratory: clear to auscultation bilaterally, no wheezing, no crackles. Normal respiratory effort. No accessory muscle use.  Cardiovascular: Regular rate and rhythm, no murmurs / rubs / gallops. No extremity edema. Psychiatric: Normal judgment and insight. Alert and oriented x 3. Normal mood.    Impression and Plan:  Hospital discharge follow-up  ABLA (acute blood loss anemia) - Plan: CBC with Differential/Platelet  Acute gastric ulcer with hemorrhage  Gastrointestinal hemorrhage associated with gastric ulcer  -Hospital charts have been reviewed in detail. -Other than still some mild fatigue she is no longer symptomatic.  She is feeling improved.  She has had no further GI bleeding.  She continues to take Plavix.  I have reiterated the importance of no NSAIDs including OTC. -Check CBC today. -She has follow-up scheduled with GI for October.  Medical decision making of moderate complexity was utilized today.    Lelon Frohlich, MD [place] [facility]

## 2021-11-11 ENCOUNTER — Telehealth: Payer: Self-pay | Admitting: Internal Medicine

## 2021-11-11 ENCOUNTER — Other Ambulatory Visit (HOSPITAL_COMMUNITY): Payer: Self-pay

## 2021-11-11 MED ORDER — LEVOTHYROXINE SODIUM 25 MCG PO TABS
25.0000 ug | ORAL_TABLET | Freq: Every day | ORAL | 0 refills | Status: DC
  Filled 2021-11-11: qty 90, 90d supply, fill #0

## 2021-11-12 ENCOUNTER — Other Ambulatory Visit (HOSPITAL_COMMUNITY): Payer: Self-pay

## 2021-11-12 DIAGNOSIS — M7918 Myalgia, other site: Secondary | ICD-10-CM | POA: Diagnosis not present

## 2021-11-12 DIAGNOSIS — M25511 Pain in right shoulder: Secondary | ICD-10-CM | POA: Diagnosis not present

## 2021-11-14 NOTE — Telephone Encounter (Signed)
Office notes printed and faxed to [employer].

## 2021-11-15 ENCOUNTER — Other Ambulatory Visit (HOSPITAL_COMMUNITY): Payer: Self-pay

## 2021-11-18 DIAGNOSIS — J479 Bronchiectasis, uncomplicated: Secondary | ICD-10-CM | POA: Diagnosis not present

## 2021-11-19 ENCOUNTER — Other Ambulatory Visit (HOSPITAL_COMMUNITY): Payer: Self-pay

## 2021-11-19 DIAGNOSIS — M5451 Vertebrogenic low back pain: Secondary | ICD-10-CM | POA: Diagnosis not present

## 2021-11-20 ENCOUNTER — Other Ambulatory Visit (HOSPITAL_COMMUNITY): Payer: Self-pay

## 2021-11-25 ENCOUNTER — Other Ambulatory Visit (HOSPITAL_COMMUNITY): Payer: Self-pay

## 2021-11-25 DIAGNOSIS — H26493 Other secondary cataract, bilateral: Secondary | ICD-10-CM | POA: Diagnosis not present

## 2021-11-25 DIAGNOSIS — Z961 Presence of intraocular lens: Secondary | ICD-10-CM | POA: Diagnosis not present

## 2021-11-25 DIAGNOSIS — G453 Amaurosis fugax: Secondary | ICD-10-CM | POA: Diagnosis not present

## 2021-11-25 DIAGNOSIS — H524 Presbyopia: Secondary | ICD-10-CM | POA: Diagnosis not present

## 2021-11-25 DIAGNOSIS — M47816 Spondylosis without myelopathy or radiculopathy, lumbar region: Secondary | ICD-10-CM | POA: Diagnosis not present

## 2021-11-25 DIAGNOSIS — G894 Chronic pain syndrome: Secondary | ICD-10-CM | POA: Diagnosis not present

## 2021-11-25 DIAGNOSIS — H04123 Dry eye syndrome of bilateral lacrimal glands: Secondary | ICD-10-CM | POA: Diagnosis not present

## 2021-11-25 DIAGNOSIS — M150 Primary generalized (osteo)arthritis: Secondary | ICD-10-CM | POA: Diagnosis not present

## 2021-11-25 DIAGNOSIS — Z79891 Long term (current) use of opiate analgesic: Secondary | ICD-10-CM | POA: Diagnosis not present

## 2021-11-25 MED ORDER — PAROXETINE HCL 30 MG PO TABS
30.0000 mg | ORAL_TABLET | Freq: Every day | ORAL | 1 refills | Status: DC
  Filled 2021-11-25: qty 90, 90d supply, fill #0

## 2021-11-25 MED ORDER — HYDROCODONE-ACETAMINOPHEN 7.5-325 MG PO TABS
1.0000 | ORAL_TABLET | Freq: Four times a day (QID) | ORAL | 0 refills | Status: DC
  Filled 2021-11-25: qty 120, 30d supply, fill #0

## 2021-11-25 MED ORDER — BELBUCA 750 MCG BU FILM
750.0000 ug | ORAL_FILM | Freq: Two times a day (BID) | BUCCAL | 2 refills | Status: DC
  Filled 2021-12-14 – 2021-12-23 (×2): qty 60, 30d supply, fill #0

## 2021-12-03 DIAGNOSIS — M5416 Radiculopathy, lumbar region: Secondary | ICD-10-CM | POA: Diagnosis not present

## 2021-12-05 ENCOUNTER — Ambulatory Visit (INDEPENDENT_AMBULATORY_CARE_PROVIDER_SITE_OTHER): Payer: Medicare PPO | Admitting: Gastroenterology

## 2021-12-05 ENCOUNTER — Encounter: Payer: Self-pay | Admitting: Gastroenterology

## 2021-12-05 ENCOUNTER — Encounter: Payer: Self-pay | Admitting: Internal Medicine

## 2021-12-05 ENCOUNTER — Other Ambulatory Visit (HOSPITAL_COMMUNITY): Payer: Self-pay

## 2021-12-05 VITALS — BP 132/64 | HR 62

## 2021-12-05 DIAGNOSIS — K250 Acute gastric ulcer with hemorrhage: Secondary | ICD-10-CM | POA: Diagnosis not present

## 2021-12-05 DIAGNOSIS — K52832 Lymphocytic colitis: Secondary | ICD-10-CM | POA: Diagnosis not present

## 2021-12-05 DIAGNOSIS — K219 Gastro-esophageal reflux disease without esophagitis: Secondary | ICD-10-CM

## 2021-12-05 DIAGNOSIS — Z7902 Long term (current) use of antithrombotics/antiplatelets: Secondary | ICD-10-CM | POA: Diagnosis not present

## 2021-12-05 DIAGNOSIS — K2100 Gastro-esophageal reflux disease with esophagitis, without bleeding: Secondary | ICD-10-CM

## 2021-12-05 MED ORDER — BUDESONIDE 3 MG PO CPEP
3.0000 mg | ORAL_CAPSULE | Freq: Every day | ORAL | 5 refills | Status: DC
  Filled 2021-12-05 – 2021-12-14 (×2): qty 90, 90d supply, fill #0
  Filled 2022-03-11: qty 90, 90d supply, fill #1
  Filled 2022-06-04: qty 90, 90d supply, fill #2
  Filled 2022-09-06: qty 90, 90d supply, fill #3
  Filled 2022-12-03: qty 90, 90d supply, fill #4

## 2021-12-05 NOTE — Patient Instructions (Addendum)
If you are age 74 or older, your body mass index should be between 23-30. Your There is no height or weight on file to calculate BMI. If this is out of the aforementioned range listed, please consider follow up with your Primary Care Provider.  You have been scheduled for an endoscopy. Please follow written instructions given to you at your visit today. If you use inhalers (even only as needed), please bring them with you on the day of your procedure.   Hold your Plavix 5 days prior to procedure.  We have sent the following medications to your pharmacy for you to pick up at your convenience: Budesonide   __________________________________________________________  The [place] [facility] providers would like to encourage you to use [facility] to communicate with providers for non-urgent requests or questions.  Due to long hold times on the telephone, sending your provider a message by [facility] may be a faster and more efficient way to get a response.  Please allow 48 business hours for a response.  Please remember that this is for non-urgent requests.     Due to recent changes in healthcare laws, you may see the results of your imaging and laboratory studies on MyChart before your provider has had a chance to review them.  We understand that in some cases there may be results that are confusing or concerning to you. Not all laboratory results come back in the same time frame and the provider may be waiting for multiple results in order to interpret others.  Please give [place] 48 hours in order for your provider to thoroughly review all the results before contacting the office for clarification of your results.

## 2021-12-05 NOTE — Progress Notes (Unsigned)
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  Started on budesonide 9 mg/day x8 weeks with taper with symptomatic improvement (constipation during course of therapy) - H2 RA stopped - Does take PPI along with an SSRI and Voltaren gel - Recurrence of index symptoms in 12/2020 and restarted extended course of budesonide with excellent response  2) GERD with LPR symptoms: Well-controlled with Nexium 40 mg/day. Discontinued Pepcid 40 mg nightly in 09/2020 due to Lymphocytic colitis and good control.  Now treated with Protonix 40 mg bid since hospital admission in 10/2021 with UGIB.   Endoscopic History: - Colonoscopy (05/2020): Benign rectal polyp, otherwise normal.  Biopsies with Lymphocytic Colitis.  Internal hemorrhoids.  Normal TI - EGD (11/05/2021, inpatient): LA Grade A esophagitis, 10 mm clean-based gastric ulcer (path: Gastritis, no H. pylori), Single diverticulum in D2.  Treated with high-dose Protonix, stop all NSAIDs, repeat EGD in 8-10 weeks   HPI:     Patient is a 76 y.o. female presenting to the Gastroenterology Clinic for hospital follow-up.  She was admitted 9/18-21, 2023 with GI bleed and acute blood loss anemia.  Transfused 2 unit PRBCs.  Inpatient EGD on 11/05/2021 with reflux esophagitis, nonbleeding gastric ulcer with clean base, suspected 2/2 Celebrex in the setting of ASA/Brilinta.  Was treated with high-dose PPI and avoidance of NSAIDs.  ASA/Brilinta discontinued, embolus started on Plavix with recommendation to repeat upper endoscopy in 8-10 weeks as outpatient.  Budesonide was  continued for her history of Lymphocytic Colitis.  H/H 9.4/27.6 at discharge.  Repeat CBC check on 11/10/2021 with H/H 11.2/33.  Today, she states she feels back to her usual state of health.  No GI complaints today.  Taking high-dose Protonix as prescribed.  No longer taking ASA, Brilinta, or any NSAIDs; does take Plavix.  Has follow-up scheduled in the Cardiology Clinic on 02/02/2022.   Still taking budesonide 3 mg/day and controls Lymphocytic Colitis well. Needs RF of budesonide. Plan to continue 3 mg/day long term.    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   right sholder   BCC (basal cell carcinoma of skin) 05/09/2014   mid lower back   BCC (basal cell carcinoma of skin) 05/03/2017   right low back   BCC (basal cell carcinoma of skin) 05/03/2017   left upper back   BCC (basal cell carcinoma of skin) 07/05/2018   left mid back   BCC (basal cell carcinoma of skin) 05/20/1992   upper back   BCC (basal cell carcinoma of skin) 07/29/1993   left sholder medial   BCC (basal cell carcinoma of skin) 07/29/1993   left sholder lateral   BCC (basal cell carcinoma of skin) 07/29/1993   right thigh   BCC (basal cell carcinoma of skin)  07/29/1993   right sholder   BCC (basal cell carcinoma of skin) 12/22/1994   right mid forearm   BCC (basal cell carcinoma of skin) 12/22/1994   right upper forearm   BCC (basal cell carcinoma of skin) 12/22/1994   lower right upper arm   BCC (basal cell carcinoma of skin) 12/22/1994   right upper arm sholder   BCC (basal cell carcinoma of skin) 08/11/1995   left leg below knee   BCC (basal cell carcinoma of skin) 04/11/2002   mid  back   BCC (basal cell carcinoma of skin) 12/10/2002   right center upper back   BCC (basal cell carcinoma of skin) 05/26/2005   right post sholder   BCC (basal cell carcinoma) 05/09/2014   left inner shin   BCC (basal cell carcinoma) 06/12/2014   left forearm   Bowen's disease 10/07/1994   right post knee, right inner forearm/wrist   Bowen's disease 08/11/1995   left sholder   Bronchiectasis ([place])    Cataract    left   Chronic pain    Common variable immunodeficiency [facility])    sees dr Gayleen Orem 02-11-2021 epic   Coronary artery disease    Depression    hx of   Diverticulosis of colon 1998   mild   DJD (degenerative joint disease)    Duodenal ulcer    h/o yrs ago   GERD (gastroesophageal reflux disease)    History of SCC (squamous cell carcinoma) of skin    Dr. Denna Haggard   History of sinus bradycardia    HLD (hyperlipidemia)    hypertriglyceridemia   Hypertensive retinopathy of both eyes 11/18/2017   Hypothyroidism    IBS (irritable bowel syndrome)    Internal hemorrhoids 1998   Lymphocytic colitis    Mitral regurgitation    mild   Nocardiosis    relased by infection disease dec 2022   Osteoarthritis    feet,shoulder,neck,back,hips and hands.   Pneumonia 2015   PONV (postoperative nausea and vomiting)    Rotator cuff tear, right 02/2019   infraspinatus and supraspinatus, and dislocation of long head of bicep tendon (Dr. Alvan Dame)   SCC (squamous cell carcinoma) 11/26/2014   left hand, right hand, right deltoid mnay areas   SCC (squamous cell carcinoma) 05/03/2017   left cheek   Sleep apnea    uses a mouth guard nightly   Squamous cell carcinoma in situ (SCCIS) 07/05/2018   left hand   Tracheobronchomalacia    Vitamin D deficiency    mild    Patient's surgical history, family medical history, social history, medications and allergies were all reviewed in Epic    Current Outpatient Medications  Medication Sig Dispense Refill   acetaminophen (TYLENOL) 500 MG  tablet Take 1,000 mg by mouth every 6 (six) hours as needed for moderate pain.      albuterol (VENTOLIN HFA) 108 (90 Base) MCG/ACT inhaler Inhale 2 puffs into the lungs every 6 (six) hours as needed for wheezing or shortness of breath. 18 g 1   atorvastatin (LIPITOR) 40 MG tablet Take 1 tablet by mouth daily. 90 tablet 1   budesonide (ENTOCORT EC) 3 MG 24 hr capsule Take 3 capsules (9 mg total) by mouth daily for 28 days, THEN 2 capsules (6 mg total) daily for 56 days, THEN 1 capsule (3 mg total) daily. 252 capsule 0   Buprenorphine HCl (BELBUCA) 750 MCG FILM Place 750 mcg film inside cheek 2 (two) times daily. 60 Film 2  Calcium Carbonate-Vitamin D 600-200 MG-UNIT TABS Take 1 tablet by mouth 2 (two) times daily.     clopidogrel (PLAVIX) 75 MG tablet Take 1 tablet (75 mg total) by mouth daily. 30 tablet 0   colestipol (COLESTID) 1 g tablet Take 1 tablet by mouth daily. 90 tablet 1   diclofenac Sodium (VOLTAREN) 1 % GEL Apply 2 g topically 4 (four) times daily as needed (joint pain).     Docusate Sodium 100 MG capsule Take 100 mg by mouth daily.     EPINEPHrine 0.3 mg/0.3 mL IJ SOAJ injection Inject 0.3 mg into the muscle as needed for anaphylaxis.     Fluorouracil (TOLAK) 4 % CREA Apply to aa total 28 applications & sun protect 40 g 0   gabapentin (NEURONTIN) 800 MG tablet Take 1 tablet by mouth at bedtime. (Patient taking differently: Take 800 mg by mouth at bedtime.) 90 tablet 2   Guaifenesin 1200 MG TB12 Take 1,200 mg by mouth 2 (two) times daily.     HYDROcodone-acetaminophen (NORCO) 7.5-325 MG tablet Take 1 tablet by mouth four times a day as needed for pain 100 tablet 0   HYDROcodone-acetaminophen (NORCO) 7.5-325 MG tablet Take 1 tablet by mouth 4 (four) times daily as needed for pain. 120 tablet 0   ipratropium (ATROVENT) 0.03 % nasal spray Place 1 spray into both nostrils 3 (three) times daily. 30 mL 5   Lactobacillus-Inulin (CULTURELLE ADULT ULT BALANCE) CAPS Take 1 capsule by mouth daily.      levothyroxine (SYNTHROID) 25 MCG tablet Take 1 tablet (25 mcg total) by mouth daily. 90 tablet 0   loratadine (CLARITIN) 10 MG tablet Take 1 tablet (10 mg total) by mouth daily as needed for allergies (Can take an extra dose during flare ups.). 60 tablet 5   Melatonin-Pyridoxine (MELATIN PO) Take 5 mg by mouth at bedtime.     methocarbamol (ROBAXIN) 500 MG tablet Take 1 tablet by mouth three times a day as needed (Patient taking differently: Take 500 mg by mouth 2 (two) times daily.) 270 tablet 2   montelukast (SINGULAIR) 10 MG tablet Take 1 tablet (10 mg total) by mouth at bedtime. (Patient taking differently: Take 10 mg by mouth daily.) 90 tablet 1   Multiple Vitamins-Minerals (MULTIVITAMIN WITH MINERALS) tablet Take 1 tablet by mouth daily.     nitroGLYCERIN (NITROSTAT) 0.4 MG SL tablet Place 1 tablet (0.4 mg total) under the tongue every 5 (five) minutes as needed. 25 tablet 2   omega-3 acid ethyl esters (LOVAZA) 1 g capsule TAKE 2 CAPSULES BY MOUTH 2 TIMES DAILY (Patient taking differently: Take 2 g by mouth 2 (two) times daily.) 360 capsule 0   ondansetron (ZOFRAN-ODT) 4 MG disintegrating tablet Take 1 tablet by mouth every 6 (six) hours as needed for nausea or vomiting. 30 tablet 1   pantoprazole (PROTONIX) 40 MG tablet Take 1 tablet (40 mg) twice daily for 10 weeks then once a day as per GI recommendations 60 tablet 1   PARoxetine (PAXIL) 30 MG tablet Take 1 tablet (30 mg total) by mouth daily. 90 tablet 1   triamcinolone (NASACORT) 55 MCG/ACT AERO nasal inhaler Place 2 sprays into each nostril once daily. 16.5 g 5   XEMBIFY 10 GM/50ML SOLN Inject 10 g into the skin once a week. Wednesday     No current facility-administered medications for this visit.    Physical Exam:     There were no vitals taken for this visit.  GENERAL:  Pleasant female  in NAD PSYCH: : Cooperative, normal affect NEURO: Alert and oriented x 3, no focal neurologic deficits   IMPRESSION and PLAN:    1)  Lymphocytic Colitis - Well-controlled on current therapy - Placed refill for budesonide 3 mg/day with 90-day supply and RF 5 - Discussed risk/benefit profile of prolonged budesonide use.  Each time she has tried to titrate off budesonide has had relapse of Lymphocytic Colitis/diarrhea within a couple of weeks.  Discussed medication ADR profile and she would like to proceed with budesonide, low-dose prolonged course  2) GERD with erosive esophagitis 3) Gastric ulcer - Continue high-dose PPI - Repeat EGD in November, 8 weeks from recent inpatient EGD - Hold Plavix 5 days prior to upper endoscopy in the event that this requires repeat endoscopic intervention given need for prolonged antiplatelet therapy.  She already has clearance documented in chart by her Cardiologist, Dr. Claiborne Billings, on 11/06/2021  4) CAD 5) Chronic antiplatelet therapy - As above, plan to hold Plavix 5 days prior to upper endoscopy.  Clearance to hold antiplatelet therapy already documented by her cardiologist on 11/06/2021.   The indications, risks, and benefits of EGD were explained to the patient in detail. Risks include but are not limited to bleeding, perforation, adverse reaction to medications, and cardiopulmonary compromise. Sequelae include but are not limited to the possibility of surgery, hospitalization, and mortality. The patient verbalized understanding and wished to proceed. All questions answered, referred to scheduler. Further recommendations pending results of the exam.             Lavena Bullion ,DO, FACG 12/05/2021, 8:24 AM

## 2021-12-09 ENCOUNTER — Encounter: Payer: Self-pay | Admitting: Internal Medicine

## 2021-12-09 DIAGNOSIS — M5416 Radiculopathy, lumbar region: Secondary | ICD-10-CM | POA: Diagnosis not present

## 2021-12-09 DIAGNOSIS — M5451 Vertebrogenic low back pain: Secondary | ICD-10-CM | POA: Diagnosis not present

## 2021-12-11 ENCOUNTER — Other Ambulatory Visit (HOSPITAL_COMMUNITY): Payer: Self-pay

## 2021-12-11 MED ORDER — AMOXICILLIN 500 MG PO CAPS
ORAL_CAPSULE | ORAL | 0 refills | Status: DC
  Filled 2021-12-11: qty 20, 5d supply, fill #0

## 2021-12-12 ENCOUNTER — Other Ambulatory Visit (HOSPITAL_COMMUNITY): Payer: Self-pay

## 2021-12-15 ENCOUNTER — Other Ambulatory Visit (HOSPITAL_COMMUNITY): Payer: Self-pay

## 2021-12-15 DIAGNOSIS — M5136 Other intervertebral disc degeneration, lumbar region: Secondary | ICD-10-CM | POA: Diagnosis not present

## 2021-12-15 DIAGNOSIS — M5416 Radiculopathy, lumbar region: Secondary | ICD-10-CM | POA: Diagnosis not present

## 2021-12-15 DIAGNOSIS — M48062 Spinal stenosis, lumbar region with neurogenic claudication: Secondary | ICD-10-CM | POA: Diagnosis not present

## 2021-12-15 DIAGNOSIS — M4310 Spondylolisthesis, site unspecified: Secondary | ICD-10-CM | POA: Diagnosis not present

## 2021-12-15 MED ORDER — PAROXETINE HCL 40 MG PO TABS
40.0000 mg | ORAL_TABLET | Freq: Every day | ORAL | 1 refills | Status: DC
  Filled 2021-12-15: qty 65, 65d supply, fill #0
  Filled 2021-12-15: qty 25, 25d supply, fill #0
  Filled 2021-12-15: qty 65, 65d supply, fill #0

## 2021-12-16 ENCOUNTER — Telehealth: Payer: Self-pay | Admitting: *Deleted

## 2021-12-16 ENCOUNTER — Telehealth: Payer: Self-pay | Admitting: Internal Medicine

## 2021-12-16 DIAGNOSIS — D62 Acute posthemorrhagic anemia: Secondary | ICD-10-CM

## 2021-12-16 NOTE — Telephone Encounter (Signed)
Approval will have to come from cardiology.  Form faxed.

## 2021-12-16 NOTE — Telephone Encounter (Signed)
Patient stopped by and dropped off surgical forms needing to be completed by Dr.Hernandez and faxed back to requesting office. Papers were placed in folder for completion.     Fax number is [phone]      Please advise

## 2021-12-16 NOTE — Telephone Encounter (Signed)
   Pre-operative Risk Assessment    Patient Name: Beverly [name]  DOB: 08/02/1945 MRN: [id number]      Request for Surgical Clearance    Procedure:   OLIF L3-5  Date of Surgery:  Clearance TBD                                 Surgeon:  DR. [facility] Surgeon's Group or Practice Name:  [facility] Phone number:  [phone] Fax number:  [phone] ATTN: Orson Slick   Type of Clearance Requested:   - Medical  - Pharmacy:  Hold Clopidogrel (Plavix)     Type of Anesthesia:  Not Indicated (GENERAL OR SPINAL?)   Additional requests/questions:    Jiles Prows   12/16/2021, 5:34 PM

## 2021-12-17 NOTE — Telephone Encounter (Signed)
I tried to call the surgery scheduler to confirm anesthesia to be used, but could not get through the phone lines. I will fax these notes to surgeon office to please let us know what type of anesthesia will be used.

## 2021-12-18 ENCOUNTER — Other Ambulatory Visit (INDEPENDENT_AMBULATORY_CARE_PROVIDER_SITE_OTHER): Payer: Medicare PPO

## 2021-12-18 DIAGNOSIS — D62 Acute posthemorrhagic anemia: Secondary | ICD-10-CM | POA: Diagnosis not present

## 2021-12-18 LAB — CBC WITH DIFFERENTIAL/PLATELET
Basophils Absolute: 0 10*3/uL (ref 0.0–0.1)
Basophils Relative: 0.7 % (ref 0.0–3.0)
Eosinophils Absolute: 0 10*3/uL (ref 0.0–0.7)
Eosinophils Relative: 0.6 % (ref 0.0–5.0)
HCT: 38.9 % (ref 36.0–46.0)
Hemoglobin: 12.6 g/dL (ref 12.0–15.0)
Lymphocytes Relative: 21.1 % (ref 12.0–46.0)
Lymphs Abs: 1.2 10*3/uL (ref 0.7–4.0)
MCHC: 32.5 g/dL (ref 30.0–36.0)
MCV: 94.2 fl (ref 78.0–100.0)
Monocytes Absolute: 0.5 10*3/uL (ref 0.1–1.0)
Monocytes Relative: 9 % (ref 3.0–12.0)
Neutro Abs: 3.8 10*3/uL (ref 1.4–7.7)
Neutrophils Relative %: 68.6 % (ref 43.0–77.0)
Platelets: 179 10*3/uL (ref 150.0–400.0)
RBC: 4.13 Mil/uL (ref 3.87–5.11)
RDW: 17 % — ABNORMAL HIGH (ref 11.5–15.5)
WBC: 5.6 10*3/uL (ref 4.0–10.5)

## 2021-12-18 NOTE — Telephone Encounter (Signed)
   Patient Name: Beverly [name]  DOB: May 16, 1945 MRN: [id number]  Primary Cardiologist: [name], MD  Chart reviewed as part of pre-operative protocol coverage. Given past medical history and time since last visit, based on ACC/AHA guidelines, [name] is at acceptable risk for the planned procedure without further cardiovascular testing. Patient was previously cleared for procedure by Dr. Claiborne Billings and Dr. Johnsie Cancel.   Per Dr. Claiborne Billings and Dr. Johnsie Cancel, patient may hold Plavix for 5 days prior to surgery. Please resume Plavix as soon as possible postprocedure, at the discretion of the surgeon.    I will route this recommendation to the requesting party via Epic fax function and remove from pre-op pool.  Please call with questions.  Lenna Sciara, NP 12/18/2021, 3:34 PM

## 2021-12-19 DIAGNOSIS — J479 Bronchiectasis, uncomplicated: Secondary | ICD-10-CM | POA: Diagnosis not present

## 2021-12-24 ENCOUNTER — Encounter: Payer: Self-pay | Admitting: Gastroenterology

## 2021-12-24 ENCOUNTER — Other Ambulatory Visit (HOSPITAL_COMMUNITY): Payer: Self-pay

## 2021-12-29 ENCOUNTER — Ambulatory Visit (HOSPITAL_COMMUNITY): Payer: Self-pay | Admitting: Orthopedic Surgery

## 2021-12-31 ENCOUNTER — Other Ambulatory Visit: Payer: Self-pay

## 2022-01-05 ENCOUNTER — Encounter: Payer: Self-pay | Admitting: Gastroenterology

## 2022-01-05 ENCOUNTER — Ambulatory Visit: Payer: Medicare PPO | Admitting: Internal Medicine

## 2022-01-05 ENCOUNTER — Encounter: Payer: Self-pay | Admitting: Internal Medicine

## 2022-01-05 ENCOUNTER — Ambulatory Visit (AMBULATORY_SURGERY_CENTER): Payer: Medicare PPO | Admitting: Gastroenterology

## 2022-01-05 ENCOUNTER — Other Ambulatory Visit (HOSPITAL_COMMUNITY): Payer: Self-pay

## 2022-01-05 VITALS — BP 122/68 | HR 47 | Temp 98.4°F | Ht 64.0 in | Wt 151.0 lb

## 2022-01-05 VITALS — BP 124/48 | HR 53 | Temp 97.8°F | Resp 13 | Ht 64.0 in | Wt 146.2 lb

## 2022-01-05 DIAGNOSIS — K2100 Gastro-esophageal reflux disease with esophagitis, without bleeding: Secondary | ICD-10-CM | POA: Diagnosis not present

## 2022-01-05 DIAGNOSIS — K219 Gastro-esophageal reflux disease without esophagitis: Secondary | ICD-10-CM

## 2022-01-05 DIAGNOSIS — K5710 Diverticulosis of small intestine without perforation or abscess without bleeding: Secondary | ICD-10-CM

## 2022-01-05 DIAGNOSIS — G4733 Obstructive sleep apnea (adult) (pediatric): Secondary | ICD-10-CM | POA: Diagnosis not present

## 2022-01-05 DIAGNOSIS — K2960 Other gastritis without bleeding: Secondary | ICD-10-CM

## 2022-01-05 DIAGNOSIS — J310 Chronic rhinitis: Secondary | ICD-10-CM

## 2022-01-05 DIAGNOSIS — R052 Subacute cough: Secondary | ICD-10-CM

## 2022-01-05 DIAGNOSIS — I2510 Atherosclerotic heart disease of native coronary artery without angina pectoris: Secondary | ICD-10-CM | POA: Diagnosis not present

## 2022-01-05 MED ORDER — LEVOCETIRIZINE DIHYDROCHLORIDE 5 MG PO TABS
5.0000 mg | ORAL_TABLET | Freq: Every evening | ORAL | 5 refills | Status: DC
  Filled 2022-01-05: qty 30, 30d supply, fill #0

## 2022-01-05 MED ORDER — SODIUM CHLORIDE 0.9 % IV SOLN: 500.0000 mL | Freq: Once | INTRAVENOUS | Status: DC

## 2022-01-05 NOTE — Op Note (Signed)
[facility] Patient Name: Beverly [name] Procedure Date: 01/05/2022 9:09 AM MRN: [id number] Endoscopist: Gerrit Heck , MD, [id number] Age: 76 Referring MD:  Date of Birth: [birth year] Gender: Female Account #: [id number] Procedure:                Upper GI endoscopy Indications:              Follow-up of reflux esophagitis, Follow-up of                            gastric ulcer with hemorrhage                           She was admitted 9/18?"21, 2023 with GI bleed and                            acute blood loss anemia. Transfused 2 unit PRBCs.                            Inpatient EGD on 11/05/2021 with reflux esophagitis,                            nonbleeding gastric ulcer with clean base,                            suspected 2/2 Celebrex in the setting of                            ASA/Brilinta. Was treated with high-dose PPI and                            avoidance of NSAIDs. ASA/Brilinta discontinued,                            started on Plavix with recommendation to repeat                            upper endoscopy in 8-10 weeks as outpatient. No                            overt bleeding since then and Hgb uptrending on                            repeat outpatient labs. Medicines:                Monitored Anesthesia Care Procedure:                Pre-Anesthesia Assessment:                           - Prior to the procedure, a History and Physical                            was performed, and patient medications and  allergies were reviewed. The patient's tolerance of                            previous anesthesia was also reviewed. The risks                            and benefits of the procedure and the sedation                            options and risks were discussed with the patient.                            All questions were answered, and informed consent                            was obtained. Prior Anticoagulants: The patient  has                            taken Plavix (clopidogrel), last dose was 5 days                            prior to procedure. ASA Grade Assessment: III - A                            patient with severe systemic disease. After                            reviewing the risks and benefits, the patient was                            deemed in satisfactory condition to undergo the                            procedure.                           After obtaining informed consent, the endoscope was                            passed under direct vision. Throughout the                            procedure, the patient's blood pressure, pulse, and                            oxygen saturations were monitored continuously. The                            Endoscope was introduced through the mouth, and                            advanced to the second part of duodenum. The upper  GI endoscopy was accomplished without difficulty.                            The patient tolerated the procedure well. Scope In: Scope Out: Findings:                 The examined esophagus was normal.                           Patchy mild inflammation characterized by erythema                            was found in the gastric fundus and in the gastric                            body. Biopsies were taken with a cold forceps for                            histology. Estimated blood loss was minimal.                           The incisura, gastric antrum and pylorus were                            normal. The previously noted gastric ulcer has                            since fully healed.                           The duodenal bulb, first portion of the duodenum                            and second portion of the duodenum were normal.                           A small diverticulum was found in the second                            portion of the duodenum. Complications:            No immediate  complications. Estimated Blood Loss:     Estimated blood loss was minimal. Impression:               - Normal esophagus.                           - Gastritis. Biopsied.                           - Normal incisura, antrum and pylorus.                           - Normal duodenal bulb, first portion of the  duodenum and second portion of the duodenum.                           - Duodenal diverticulum. Recommendation:           - Patient has a contact number available for                            emergencies. The signs and symptoms of potential                            delayed complications were discussed with the                            patient. Return to normal activities tomorrow.                            Written discharge instructions were provided to the                            patient.                           - Resume previous diet.                           - Continue present medications.                           - Await pathology results.                           - Resume Plavix (clopidogrel) at prior dose                            tomorrow. Gerrit Heck, MD 01/05/2022 10:35:36 AM

## 2022-01-05 NOTE — Progress Notes (Signed)
GASTROENTEROLOGY PROCEDURE H&P NOTE   Primary Care Physician: Isaac Bliss, Rayford Halsted, MD    Reason for Procedure:  GERD with erosive esophagitis, gastric ulcer follow-up  Plan:    EGD  Patient is appropriate for endoscopic procedure(s) in the ambulatory ([place]) setting.  The nature of the procedure, as well as the risks, benefits, and alternatives were carefully and thoroughly reviewed with the patient. Ample time for discussion and questions allowed. The patient understood, was satisfied, and agreed to proceed.     HPI: Beverly [name] is a 76 y.o. female who presents for EGD for evaluation of GERD with erosive esophagitis and gastric ulcer follow-up after recent hospitalization for UGI bleed.  Patient was most recently seen in the Gastroenterology Clinic on 12/05/2021 by me.  Holding Plavix x5 days for procedure today.  No interval change in medical history since that appointment. Please refer to that note for full details regarding GI history and clinical presentation.   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   right sholder   BCC (basal cell carcinoma of skin) 05/09/2014   mid lower back   BCC (basal cell carcinoma of skin) 05/03/2017   right low back   BCC (basal cell carcinoma of skin) 05/03/2017   left upper back   BCC (basal cell carcinoma of skin) 07/05/2018   left mid back   BCC (basal cell carcinoma of skin) 05/20/1992   upper back   BCC (basal cell carcinoma of skin) 07/29/1993   left sholder medial   BCC (basal cell carcinoma of skin) 07/29/1993   left sholder lateral   BCC (basal cell carcinoma of skin) 07/29/1993   right thigh   BCC (basal cell carcinoma of skin) 07/29/1993   right sholder   BCC  (basal cell carcinoma of skin) 12/22/1994   right mid forearm   BCC (basal cell carcinoma of skin) 12/22/1994   right upper forearm   BCC (basal cell carcinoma of skin) 12/22/1994   lower right upper arm   BCC (basal cell carcinoma of skin) 12/22/1994   right upper arm sholder   BCC (basal cell carcinoma of skin) 08/11/1995   left leg below knee   BCC (basal cell carcinoma of skin) 04/11/2002   mid back   BCC (basal cell carcinoma of skin) 12/10/2002   right center upper back   BCC (basal cell carcinoma of skin) 05/26/2005   right post sholder   BCC (basal cell carcinoma) 05/09/2014   left inner shin   BCC (basal cell carcinoma) 06/12/2014   left forearm   Bowen's disease 10/07/1994   right post knee, right inner forearm/wrist   Bowen's disease 08/11/1995   left sholder   Bronchiectasis ([place])    Cataract    left   Chronic pain    Common variable immunodeficiency [facility])    sees dr Gayleen Orem 02-11-2021 epic   Coronary artery disease    Depression    hx of   Diverticulosis of colon 1998   mild   DJD (degenerative joint disease)    Duodenal ulcer    h/o yrs ago   GERD (gastroesophageal reflux disease)    History of SCC (  squamous cell carcinoma) of skin    Dr. Denna Haggard   History of sinus bradycardia    HLD (hyperlipidemia)    hypertriglyceridemia   Hypertensive retinopathy of both eyes 11/18/2017   Hypothyroidism    IBS (irritable bowel syndrome)    Internal hemorrhoids 1998   Lymphocytic colitis    Mitral regurgitation    mild   Nocardiosis    relased by infection disease dec 2022   Osteoarthritis    feet,shoulder,neck,back,hips and hands.   Pneumonia 2015   PONV (postoperative nausea and vomiting)    Rotator cuff tear, right 02/2019   infraspinatus and supraspinatus, and dislocation of long head of bicep tendon (Dr. Alvan Dame)   SCC (squamous cell carcinoma) 11/26/2014   left hand, right hand, right deltoid mnay areas   SCC (squamous cell carcinoma) 05/03/2017    left cheek   Sleep apnea    uses a mouth guard nightly   Squamous cell carcinoma in situ (SCCIS) 07/05/2018   left hand   Tracheobronchomalacia    Vitamin D deficiency    mild    Past Surgical History:  Procedure Laterality Date   ABDOMINAL HYSTERECTOMY  1998   complete   BIOPSY  11/05/2021   Procedure: BIOPSY;  Surgeon: Thornton Park, MD;  Location: [facility] ENDOSCOPY;  Service: Gastroenterology;;   BLEPHAROPLASTY Bilateral 01/2018   BROW LIFT Bilateral 10/10/2018   Procedure: Delphina Cahill LIFT;  Surgeon: Wallace Going, DO;  Location: [place];  Service: Plastics;  Laterality: Bilateral;   BUNIONECTOMY     R 12/08, L 2004 (Dr. Janus Molder)   [place]   bilateral   CARPAL TUNNEL RELEASE Left 03/06/2021   Procedure: Left Revision Carpal Tunnel Release with hypothenar fat pad flap;  Surgeon: [name], MD;  Location: [place];  Service: Orthopedics;  Laterality: Left;  with local anesthesia   CATARACT EXTRACTION Bilateral Left in 08/2012, Right 04/2016   Dr.Hecker   CESAREAN SECTION     1981 and [place]   laparoscopic   CLOSED REDUCTION NASAL FRACTURE N/A 08/29/2018   Procedure: CLOSED REDUCTION NASAL FRACTURE;  Surgeon: Wallace Going, DO;  Location: [place];  Service: Plastics;  Laterality: N/A;  1 hour, please   COLONOSCOPY  2006   COLONOSCOPY  01/2012   due again 01/2021; mild diverticulosis   CORONARY STENT INTERVENTION N/A 05/19/2021   Procedure: CORONARY STENT INTERVENTION;  Surgeon: Nelva Bush, MD;  Location: [place] CV LAB;  Service: Cardiovascular;  Laterality: N/A;   epidural steroid injection, back  02/2010   ESOPHAGOGASTRODUODENOSCOPY (EGD) WITH PROPOFOL N/A 11/05/2021   Procedure: ESOPHAGOGASTRODUODENOSCOPY (EGD) WITH PROPOFOL;  Surgeon: Thornton Park, MD;  Location: [place];  Service: Gastroenterology;  Laterality: N/A;    HIP SURGERY     right bursectomy x 3   HIP SURGERY Right 2000   torn cartilage, repaired   INGUINAL HERNIA REPAIR  10/2007   bilat   INTRAVASCULAR ULTRASOUND/IVUS N/A 05/19/2021   Procedure: Intravascular Ultrasound/IVUS;  Surgeon: Nelva Bush, MD;  Location: [place] CV LAB;  Service: Cardiovascular;  Laterality: N/A;   LEFT HEART CATH AND CORONARY ANGIOGRAPHY N/A 05/19/2021   Procedure: LEFT HEART CATH AND CORONARY ANGIOGRAPHY;  Surgeon: Nelva Bush, MD;  Location: [place] CV LAB;  Service: Cardiovascular;  Laterality: N/A;   NECK SURGERY  1989   c6-7 cervical laminectomy and diskecotmy   REVERSE SHOULDER ARTHROPLASTY Right 04/11/2019  Procedure: REVERSE SHOULDER ARTHROPLASTY;  Surgeon: Nicholes Stairs, MD;  Location: [place];  Service: Orthopedics;  Laterality: Right;  [place] Block   SHOULDER SURGERY Left 03/2003   left rotator cuff repair   SHOULDER SURGERY Left 03/14/2018   rotator cuff repair; Dr. Tonita Cong   TONSILLECTOMY  age 68   TROCHANTERIC BURSA EXCISION Right [place] ENDOSCOPY     WISDOM TOOTH EXTRACTION     as teenager    Prior to Admission medications   Medication Sig Start Date End Date Taking? Authorizing Provider  acetaminophen (TYLENOL) 650 MG CR tablet SMARTSIG:1 Tablet(s) By Mouth 12/05/21  Yes [provider]  atorvastatin (LIPITOR) 40 MG tablet Take 1 tablet by mouth daily. 07/23/21 07/23/22 Yes Erline Hau, MD  budesonide (ENTOCORT EC) 3 MG 24 hr capsule Take 1 capsule (3 mg total) by mouth daily. 12/05/21  Yes Circe Chilton V, DO  Buprenorphine HCl (BELBUCA) 750 MCG FILM Place 750 mcg film inside cheek to dissolve 2 (two) times daily. 11/25/21  Yes   Calcium Carbonate-Vitamin D 600-200 MG-UNIT TABS Take 1 tablet by mouth 2 (two) times daily.   Yes [provider]  colestipol (COLESTID) 1 g tablet Take 1 tablet by mouth daily. 10/21/21  Yes Kozlow, Donnamarie Poag, MD   Docusate Sodium 100 MG capsule Take 100 mg by mouth daily. 09/27/20  Yes [provider]  gabapentin (NEURONTIN) 800 MG tablet Take 1 tablet by mouth at bedtime. Patient taking differently: Take 800 mg by mouth at bedtime. 10/29/21  Yes   Guaifenesin 1200 MG TB12 Take 1,200 mg by mouth 2 (two) times daily.   Yes [provider]  HYDROcodone-acetaminophen (NORCO) 7.5-325 MG tablet Take 1 tablet by mouth 4 (four) times daily as needed for pain. 11/25/21  Yes   Lactobacillus-Inulin (CULTURELLE ADULT ULT BALANCE) CAPS Take 1 capsule by mouth daily.   Yes [provider]  levothyroxine (SYNTHROID) 25 MCG tablet Take 1 tablet (25 mcg total) by mouth daily. 11/11/21  Yes Isaac Bliss, Rayford Halsted, MD  loratadine (CLARITIN) 10 MG tablet Take 1 tablet (10 mg total) by mouth daily as needed for allergies (Can take an extra dose during flare ups.). 08/12/21  Yes Kozlow, Donnamarie Poag, MD  Melatonin-Pyridoxine (MELATIN PO) Take 5 mg by mouth at bedtime. 02/13/20  Yes [provider]  methocarbamol (ROBAXIN) 500 MG tablet Take 1 tablet by mouth three times a day as needed Patient taking differently: Take 500 mg by mouth 2 (two) times daily. 06/20/21  Yes   montelukast (SINGULAIR) 10 MG tablet Take 1 tablet (10 mg total) by mouth at bedtime. Patient taking differently: Take 10 mg by mouth daily. 08/12/21  Yes Kozlow, Donnamarie Poag, MD  Multiple Vitamins-Minerals (MULTIVITAMIN WITH MINERALS) tablet Take 1 tablet by mouth daily.   Yes [provider]  omega-3 acid ethyl esters (LOVAZA) 1 g capsule TAKE 2 CAPSULES BY MOUTH 2 TIMES DAILY Patient taking differently: Take 2 g by mouth 2 (two) times daily. 10/15/21 10/15/22 Yes Erline Hau, MD  pantoprazole (PROTONIX) 40 MG tablet Take 1 tablet (40 mg) twice daily for 10 weeks then once a day as per GI recommendations 11/06/21  Yes Aline August, MD  PARoxetine (PAXIL) 40 MG tablet Take 1 tablet by mouth daily.   Yes [provider]  triamcinolone (NASACORT) 55 MCG/ACT AERO nasal inhaler Place 2 sprays into each nostril once daily. 10/10/21  Yes  Kozlow, Donnamarie Poag, MD  XEMBIFY 10 GM/50ML SOLN Inject 10 g into the skin once a week. Wednesday 07/21/21  Yes [provider]  acetaminophen (TYLENOL) 500 MG tablet Take 1,000 mg by mouth every 6 (six) hours as needed for moderate pain.     [provider]  albuterol (VENTOLIN HFA) 108 (90 Base) MCG/ACT inhaler Inhale 2 puffs into the lungs every 6 (six) hours as needed for wheezing or shortness of breath. 08/12/21   Kozlow, Donnamarie Poag, MD  amoxicillin (AMOXIL) 500 MG tablet Take 4 tablet(s) 1-2 hours prior to dental work by oral route. 12/11/21   [provider]  clopidogrel (PLAVIX) 75 MG tablet Take 1 tablet (75 mg total) by mouth daily. 11/08/21   Aline August, MD  COVID-19 mRNA Virus Vaccine (COMIRNATY IM) Inject into the muscle. 11/06/21   [provider]  diclofenac Sodium (VOLTAREN) 1 % GEL Apply 2 g topically 4 (four) times daily as needed (joint pain). 12/29/18   [provider]  EPINEPHrine 0.3 mg/0.3 mL IJ SOAJ injection Inject 0.3 mg into the muscle as needed for anaphylaxis. 01/21/21   [provider]  Fluorouracil (TOLAK) 4 % CREA Apply to aa total 28 applications & sun protect 04/07/21   Lavonna Monarch, MD  ipratropium (ATROVENT) 0.03 % nasal spray Place 1 spray into both nostrils 3 (three) times daily. 08/12/21   Kozlow, Donnamarie Poag, MD  levofloxacin (LEVAQUIN) 500 MG tablet     [provider]  nitroGLYCERIN (NITROSTAT) 0.4 MG SL tablet Place 1 tablet (0.4 mg total) under the tongue every 5 (five) minutes as needed. 05/20/21   Cheryln Manly, NP  ondansetron (ZOFRAN-ODT) 4 MG disintegrating tablet Take 1 tablet by mouth every 6 (six) hours as needed for nausea or vomiting. 03/25/21   Ellenor Wisniewski V, DO  PARoxetine (PAXIL) 30 MG tablet Take 1 tablet (30 mg total) by mouth daily. Patient not taking: Reported  on 01/05/2022 11/25/21     potassium chloride (KLOR-CON) 10 MEQ tablet Take 2 tablets (total 20 MEQ) by mouth once 03/29/20 04/16/20  Rosiland Oz, MD    Current Outpatient Medications  Medication Sig Dispense Refill   acetaminophen (TYLENOL) 650 MG CR tablet SMARTSIG:1 Tablet(s) By Mouth     atorvastatin (LIPITOR) 40 MG tablet Take 1 tablet by mouth daily. 90 tablet 1   budesonide (ENTOCORT EC) 3 MG 24 hr capsule Take 1 capsule (3 mg total) by mouth daily. 90 capsule 5   Buprenorphine HCl (BELBUCA) 750 MCG FILM Place 750 mcg film inside cheek to dissolve 2 (two) times daily. 60 each 2   Calcium Carbonate-Vitamin D 600-200 MG-UNIT TABS Take 1 tablet by mouth 2 (two) times daily.     colestipol (COLESTID) 1 g tablet Take 1 tablet by mouth daily. 90 tablet 1   Docusate Sodium 100 MG capsule Take 100 mg by mouth daily.     gabapentin (NEURONTIN) 800 MG tablet Take 1 tablet by mouth at bedtime. (Patient taking differently: Take 800 mg by mouth at bedtime.) 90 tablet 2   Guaifenesin 1200 MG TB12 Take 1,200 mg by mouth 2 (two) times daily.     HYDROcodone-acetaminophen (NORCO) 7.5-325 MG tablet Take 1 tablet by mouth 4 (four) times daily as needed for pain. 120 tablet 0   Lactobacillus-Inulin (CULTURELLE ADULT ULT BALANCE) CAPS Take 1 capsule by mouth daily.     levothyroxine (SYNTHROID) 25 MCG tablet Take 1 tablet (25 mcg total) by mouth daily. 90 tablet 0  loratadine (CLARITIN) 10 MG tablet Take 1 tablet (10 mg total) by mouth daily as needed for allergies (Can take an extra dose during flare ups.). 60 tablet 5   Melatonin-Pyridoxine (MELATIN PO) Take 5 mg by mouth at bedtime.     methocarbamol (ROBAXIN) 500 MG tablet Take 1 tablet by mouth three times a day as needed (Patient taking differently: Take 500 mg by mouth 2 (two) times daily.) 270 tablet 2   montelukast (SINGULAIR) 10 MG tablet Take 1 tablet (10 mg total) by mouth at bedtime. (Patient taking differently: Take 10 mg by mouth daily.) 90  tablet 1   Multiple Vitamins-Minerals (MULTIVITAMIN WITH MINERALS) tablet Take 1 tablet by mouth daily.     omega-3 acid ethyl esters (LOVAZA) 1 g capsule TAKE 2 CAPSULES BY MOUTH 2 TIMES DAILY (Patient taking differently: Take 2 g by mouth 2 (two) times daily.) 360 capsule 0   pantoprazole (PROTONIX) 40 MG tablet Take 1 tablet (40 mg) twice daily for 10 weeks then once a day as per GI recommendations 60 tablet 1   PARoxetine (PAXIL) 40 MG tablet Take 1 tablet by mouth daily.     triamcinolone (NASACORT) 55 MCG/ACT AERO nasal inhaler Place 2 sprays into each nostril once daily. 16.5 g 5   XEMBIFY 10 GM/50ML SOLN Inject 10 g into the skin once a week. Wednesday     acetaminophen (TYLENOL) 500 MG tablet Take 1,000 mg by mouth every 6 (six) hours as needed for moderate pain.      albuterol (VENTOLIN HFA) 108 (90 Base) MCG/ACT inhaler Inhale 2 puffs into the lungs every 6 (six) hours as needed for wheezing or shortness of breath. 18 g 1   amoxicillin (AMOXIL) 500 MG tablet Take 4 tablet(s) 1-2 hours prior to dental work by oral route.     clopidogrel (PLAVIX) 75 MG tablet Take 1 tablet (75 mg total) by mouth daily. 30 tablet 0   COVID-19 mRNA Virus Vaccine (COMIRNATY IM) Inject into the muscle.     diclofenac Sodium (VOLTAREN) 1 % GEL Apply 2 g topically 4 (four) times daily as needed (joint pain).     EPINEPHrine 0.3 mg/0.3 mL IJ SOAJ injection Inject 0.3 mg into the muscle as needed for anaphylaxis.     Fluorouracil (TOLAK) 4 % CREA Apply to aa total 28 applications & sun protect 40 g 0   ipratropium (ATROVENT) 0.03 % nasal spray Place 1 spray into both nostrils 3 (three) times daily. 30 mL 5   levofloxacin (LEVAQUIN) 500 MG tablet      nitroGLYCERIN (NITROSTAT) 0.4 MG SL tablet Place 1 tablet (0.4 mg total) under the tongue every 5 (five) minutes as needed. 25 tablet 2   ondansetron (ZOFRAN-ODT) 4 MG disintegrating tablet Take 1 tablet by mouth every 6 (six) hours as needed for nausea or vomiting.  30 tablet 1   PARoxetine (PAXIL) 30 MG tablet Take 1 tablet (30 mg total) by mouth daily. (Patient not taking: Reported on 01/05/2022) 90 tablet 1   Current Facility-Administered Medications  Medication Dose Route Frequency Provider Last Rate Last Admin   0.9 %  sodium chloride infusion  500 mL Intravenous Once Holiday Mcmenamin V, DO        Allergies as of 01/05/2022 - Review Complete 01/05/2022  Allergen Reaction Noted   Adhesive [tape] Rash 10/08/2010   Codeine Rash 10/08/2010    Family History  Problem Relation Age of Onset   Depression Mother    Schizophrenia Mother  Cancer Father        oral   Heart disease Father        bradycardia   Esophageal cancer Father    Cancer Sister        skin and lung   COPD Sister    Hypertension Sister    Osteoporosis Sister        compression fx's x 3 11/2018   Hyperthyroidism Brother    Cancer Brother        metastatic cancer to bone (?primary)   Diabetes Maternal Grandmother    Cancer Paternal Grandmother 28       colon cancer, metastatic to liver   Colon cancer Paternal Grandmother    Liver cancer Paternal Grandmother    Hyperlipidemia Daughter    Asthma Son    ADD / ADHD Other     Social History   Socioeconomic History   Marital status: Divorced    Spouse name: Not on file   Number of children: 2   Years of education: Not on file   Highest education level: Master's degree (e.g., MA, MS, MEng, MEd, MSW, MBA)  Occupational History   Occupation: STAFF Optician, dispensing: [employer]   Occupation: Retired  Tobacco Use   Smoking status: Never   Smokeless tobacco: Never  Vaping Use   Vaping Use: Never used  Substance and Sexual Activity   Alcohol use: Yes    Alcohol/week: 7.0 standard drinks of alcohol    Types: 7 Glasses of wine per week    Comment: 1 glass wine per day   Drug use: No   Sexual activity: Not Currently  Other Topics Concern   Not on file  Social History Narrative   Divorced, lives  alone, [place]; 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   [place] for [employer] part-time   Social Determinants of Health   Financial Resource Strain: [place]  (05/17/2021)   Overall Financial Resource Strain (CARDIA)    Difficulty of Paying Living Expenses: Not hard at all  Food Insecurity: No [place] (05/17/2021)   Hunger Vital Sign    Worried About Running Out of Food in the Last Year: Never true    [place] in the Last Year: Never true  Transportation Needs: No Transportation Needs (05/17/2021)   PRAPARE - Hydrologist (Medical): No    Lack of Transportation (Non-Medical): No  Physical Activity: Insufficiently Active (05/17/2021)   Exercise Vital Sign    Days of Exercise per Week: 1 day    Minutes of Exercise per Session: 10 min  Stress: No Stress Concern Present (05/17/2021)   [place]    Feeling of Stress : Not at all  Social Connections: Moderately Integrated (05/17/2021)   Social Connection and Isolation Panel [NHANES]    Frequency of Communication with Friends and Family: More than three times a week    Frequency of Social Gatherings with Friends and Family: More than three times a week    Attends Religious Services: More than 4 times per year    Active Member of [employer] or Organizations: Yes    Attends Archivist Meetings: More than 4 times per year    Marital Status: Divorced  Intimate Partner Violence: Not At Risk (02/04/2021)   Humiliation, Afraid, Rape, and  Kick questionnaire    Fear of Current or Ex-Partner: No    Emotionally Abused: No    Physically Abused: No    Sexually Abused: No    Physical Exam: Vital signs in last 24 hours: '@BP'$  129/61   Pulse (!) 55   Temp 97.8 F (36.6 C) (Temporal)   Resp 10   Ht '5\' 4"'$  (1.626 m)    Wt 146 lb 3.2 oz (66.3 kg)   SpO2 97%   BMI 25.10 kg/m  GEN: NAD EYE: Sclerae anicteric ENT: MMM CV: Non-tachycardic Pulm: CTA b/l GI: Soft, NT/ND NEURO:  Alert & Oriented x 3   Gerrit Heck, DO [facility]   01/05/2022 10:03 AM

## 2022-01-05 NOTE — Patient Instructions (Addendum)
Please schedule follow up scheduled with myself in 4 months.  If my schedule is not open yet, we will contact you with a reminder closer to that time. Please call [phone] if you haven't heard from [place] a month before.   Most likely your cough symptoms are related to post nasal drainage, maybe with some reflux.  Continue ipratropium nasal spray. Can add nasal saline sprays in between if that helps.  Stop claritin and switch to xyzal (levocetirizine) Let us know if fevers, chills, night sweats, weight loss, or if you're coughing up blood or enough sputum to fill up a tbsp or a small cup. These are signs of worsening pulmonary infection.

## 2022-01-05 NOTE — Progress Notes (Signed)
Pt resting comfortably. VSS. Airway intact. SBAR complete to RN. All questions answered.   

## 2022-01-05 NOTE — Progress Notes (Signed)
Beverly [name]    [id number]    05/09/1945  Primary Care Physician:Hernandez Everardo Beals, MD Date of Appointment: 01/05/2022 Established Patient Visit  Chief complaint:   Chief Complaint  Patient presents with   Follow-up    Productive cough, yellow sputum.      HPI: Beverly [name] is a 76 y.o. woman with bronchiectasis a hypogammaglobulinemia with chronic rhinitis. On IVIG infusions for hypogammaglobulinemia. Has nocardia pulmonary infection and is on 12 months therapy with bactrim which finished Dec 2022. Also has LPR and history of gastric ulcers. Has chronic vasomotor rhinitis and takes ipratropium. Has CAD and had PCI with 1 DES stents placed in 2023. Has lymphocytic colitis.   Interval Updates: Here for follow up - acute visit for productive cough with yellow sputum. Over the last 7-10 days she has been coughing more. Coughing up scant sputum which she feels like she's having trouble getting up.   No documented fevers or chills, has had a couple episodes of night sweats.  Appetite is ok.   Taking mucinex every day. Not doing any airway clearance. She is having worsening post nasal drainage. Coughing more towards the middle of the day.   Short of breath with talking for long periods of time. Still taking BID protonix. Had Endoscopy this morning and says that her gastric ulcer is healing nicely. Plan to switch to H2 blocker soon.    I have reviewed the patient's past medical, family, and social history and made changes as appropriate.    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   right sholder   BCC (basal cell carcinoma of skin) 05/09/2014   mid lower back   BCC (basal cell carcinoma of skin)  05/03/2017   right low back   BCC (basal cell carcinoma of skin) 05/03/2017   left upper back   BCC (basal cell carcinoma of skin) 07/05/2018   left mid back   BCC (basal cell carcinoma of skin) 05/20/1992   upper back   BCC (basal cell carcinoma of skin) 07/29/1993   left sholder medial   BCC (basal cell carcinoma of skin) 07/29/1993   left sholder lateral   BCC (basal cell carcinoma of skin) 07/29/1993   right thigh   BCC (basal cell carcinoma of skin) 07/29/1993   right sholder   BCC (basal cell carcinoma of skin) 12/22/1994   right mid forearm   BCC (basal cell carcinoma of skin) 12/22/1994   right upper forearm   BCC (basal cell carcinoma of skin) 12/22/1994   lower right upper arm   BCC (basal cell carcinoma of skin) 12/22/1994   right upper arm sholder   BCC (basal cell carcinoma of skin) 08/11/1995   left leg below knee   BCC (basal cell carcinoma of skin) 04/11/2002   mid back   BCC (basal cell carcinoma of skin) 12/10/2002   right center upper back   BCC (basal cell carcinoma of skin) 05/26/2005   right post sholder   BCC (basal cell carcinoma) 05/09/2014   left inner shin   BCC (basal cell carcinoma) 06/12/2014   left forearm  Bowen's disease 10/07/1994   right post knee, right inner forearm/wrist   Bowen's disease 08/11/1995   left sholder   Bronchiectasis ([place])    Cataract    left   Chronic pain    Common variable immunodeficiency [facility])    sees dr Gayleen Orem 02-11-2021 epic   Coronary artery disease    Depression    hx of   Diverticulosis of colon 1998   mild   DJD (degenerative joint disease)    Duodenal ulcer    h/o yrs ago   GERD (gastroesophageal reflux disease)    History of SCC (squamous cell carcinoma) of skin    Dr. Denna Haggard   History of sinus bradycardia    HLD (hyperlipidemia)    hypertriglyceridemia   Hypertensive retinopathy of both eyes 11/18/2017   Hypothyroidism    IBS (irritable bowel syndrome)    Internal hemorrhoids 1998    Lymphocytic colitis    Mitral regurgitation    mild   Nocardiosis    relased by infection disease dec 2022   Osteoarthritis    feet,shoulder,neck,back,hips and hands.   Pneumonia 2015   PONV (postoperative nausea and vomiting)    Rotator cuff tear, right 02/2019   infraspinatus and supraspinatus, and dislocation of long head of bicep tendon (Dr. Alvan Dame)   SCC (squamous cell carcinoma) 11/26/2014   left hand, right hand, right deltoid mnay areas   SCC (squamous cell carcinoma) 05/03/2017   left cheek   Sleep apnea    uses a mouth guard nightly   Squamous cell carcinoma in situ (SCCIS) 07/05/2018   left hand   Tracheobronchomalacia    Vitamin D deficiency    mild    Past Surgical History:  Procedure Laterality Date   ABDOMINAL HYSTERECTOMY  1998   complete   BIOPSY  11/05/2021   Procedure: BIOPSY;  Surgeon: Thornton Park, MD;  [facility] ENDOSCOPY;  Service: Gastroenterology;;   BLEPHAROPLASTY Bilateral 01/2018   BROW LIFT Bilateral 10/10/2018   Procedure: Delphina Cahill LIFT;  Surgeon: Wallace Going, DO;  Location: [place];  Service: Plastics;  Laterality: Bilateral;   BUNIONECTOMY     R 12/08, L 2004 (Dr. Janus Molder)   [place]   bilateral   CARPAL TUNNEL RELEASE Left 03/06/2021   Procedure: Left Revision Carpal Tunnel Release with hypothenar fat pad flap;  Surgeon: [name], MD;  Location: [place];  Service: Orthopedics;  Laterality: Left;  with local anesthesia   CATARACT EXTRACTION Bilateral Left in 08/2012, Right 04/2016   Dr.Hecker   CESAREAN SECTION     1981 and Rafael Hernandez   laparoscopic   CLOSED REDUCTION NASAL FRACTURE N/A 08/29/2018   Procedure: CLOSED REDUCTION NASAL FRACTURE;  Surgeon: Wallace Going, DO;  Location: [place];  Service: Plastics;  Laterality: N/A;  1 hour, please   COLONOSCOPY  2006   COLONOSCOPY  01/2012   due  again 01/2021; mild diverticulosis   CORONARY STENT INTERVENTION N/A 05/19/2021   Procedure: CORONARY STENT INTERVENTION;  Surgeon: Nelva Bush, MD;  Location: [place] CV LAB;  Service: Cardiovascular;  Laterality: N/A;   epidural steroid injection, back  02/2010   ESOPHAGOGASTRODUODENOSCOPY (EGD) WITH PROPOFOL N/A 11/05/2021   Procedure: ESOPHAGOGASTRODUODENOSCOPY (EGD) WITH PROPOFOL;  Surgeon: Thornton Park, MD;  Location: [place];  Service: Gastroenterology;  Laterality: N/A;   HIP SURGERY     right bursectomy x 3  HIP SURGERY Right 2000   torn cartilage, repaired   INGUINAL HERNIA REPAIR  10/2007   bilat   INTRAVASCULAR ULTRASOUND/IVUS N/A 05/19/2021   Procedure: Intravascular Ultrasound/IVUS;  Surgeon: Nelva Bush, MD;  Location: [place] [facility];  Service: Cardiovascular;  Laterality: N/A;   LEFT HEART CATH AND CORONARY ANGIOGRAPHY N/A 05/19/2021   Procedure: LEFT HEART CATH AND CORONARY ANGIOGRAPHY;  Surgeon: Nelva Bush, MD;  Location: [place] [facility];  Service: Cardiovascular;  Laterality: N/A;   NECK SURGERY  1989   c6-7 cervical laminectomy and diskecotmy   REVERSE SHOULDER ARTHROPLASTY Right 04/11/2019   Procedure: REVERSE SHOULDER ARTHROPLASTY;  Surgeon: Nicholes Stairs, MD;  Location: [place];  Service: Orthopedics;  Laterality: Right;  Regional Block   SHOULDER SURGERY Left 03/2003   left rotator cuff repair   SHOULDER SURGERY Left 03/14/2018   rotator cuff repair; Dr. Tonita Cong   TONSILLECTOMY  age 69   [facility] [place] [facility]     WISDOM TOOTH EXTRACTION     as teenager    Family History  Problem Relation Age of Onset   Depression Mother    Schizophrenia Mother    Cancer Father        oral   Heart disease Father        bradycardia   Esophageal cancer Father    Cancer Sister        skin and lung   COPD Sister    Hypertension Sister    Osteoporosis  Sister        compression fx's x 3 11/2018   Hyperthyroidism Brother    Cancer Brother        metastatic cancer to bone (?primary)   Diabetes Maternal Grandmother    Cancer Paternal Grandmother 30       colon cancer, metastatic to liver   Colon cancer Paternal Grandmother    Liver cancer Paternal Grandmother    Hyperlipidemia Daughter    Asthma Son    ADD / ADHD Other     Social History   Occupational History   Occupation: STAFF Optician, dispensing: [employer]   Occupation: Retired  Tobacco Use   Smoking status: Never   Smokeless tobacco: Never  Vaping Use   Vaping Use: Never used  Substance and Sexual Activity   Alcohol use: Yes    Alcohol/week: 7.0 standard drinks of alcohol    Types: 7 Glasses of wine per week    Comment: 1 glass wine per day   Drug use: No   Sexual activity: Not Currently     Physical Exam: Blood pressure 122/68, pulse (!) 47, temperature 98.4 F (36.9 C), temperature source Oral, height '5\' 4"'$  (1.626 m), weight 151 lb (68.5 kg), SpO2 98 %.  Gen:      NAD Lungs:   ctab no wheezes or crackles CV:       RRR no mrg  Data Reviewed: Imaging: I have personally reviewed the CT Chest May 2021 which shows central and lower lobe bronchiectasis. TBM on expiratory cuts.   PFTs: May 2022 Spirometry WNL.   Sleep study June 2021 IMPRESSIONS - No significant obstructive sleep apnea occurred during this study (AHI = 1.9/h). - No significant central sleep apnea occurred during this study (CAI = 0.7/h). - The patient had minimal or no oxygen desaturation during the study (Min O2 = 89.0%) - The patient snored with soft  snoring volume.  Labs: Lab Results  Component Value Date   WBC 5.6 12/18/2021   HGB 12.6 12/18/2021   HCT 38.9 12/18/2021   MCV 94.2 12/18/2021   PLT 179.0 12/18/2021   Lab Results  Component Value Date   NA 142 11/06/2021   K 4.1 11/06/2021   CL 112 (H) 11/06/2021   CO2 25 11/06/2021  Cr 0.59  Immunization  status: Immunization History  Administered Date(s) Administered   Fluad Quad(high Dose 65+) 11/10/2019   Hepatitis A 06/17/2006, 01/05/2007   IPV 04/15/2012   Influenza Split 10/31/2013   Influenza, High Dose Seasonal PF 11/16/2016, 11/16/2017, 11/01/2018, 10/22/2021   Influenza,inj,quad, With Preservative 11/16/2017, 12/12/2018   Influenza-Unspecified 11/09/2014, 11/14/2015, 10/25/2020   PFIZER Comirnaty(Gray Top)Covid-19 Tri-Sucrose Vaccine 03/22/2020   PFIZER(Purple Top)SARS-COV-2 Vaccination 02/05/2019, 02/25/2019, 10/19/2019   Pfizer Covid-19 Vaccine Bivalent Booster 51yr & up 11/07/2020   Pfizer Covid-19 Vaccine Bivalent Booster 5y-11y 11/19/2021   Pneumococcal Conjugate-13 07/20/2013   Pneumococcal Polysaccharide-23 06/17/2006, 11/26/2014, 09/05/2019   Rsv, Bivalent, Protein Subunit Rsvpref,pf (Evans Lance 10/23/2019   Tdap 09/16/2004, 10/08/2014   Typhoid Live 06/17/2011, 06/10/2017   Unspecified SARS-COV-2 Vaccination 02/20/2019   Yellow Fever 07/08/2012   Zoster Recombinat (Shingrix) 04/28/2016, 08/15/2016   Zoster, Live 04/04/2009    Assessment:  Bronchiectasis without complication, mild History of pulmonary nocardiosis Chronic vasomotor rhinitis OSA with excessive daytime sleepiness - managed with oral appliance by dentistry.  Chronic cough with LPR, controlled Tracheobronchomalacia (TBM) Acute cough - likely worsening of above conditions.    Plan/Recommendations: Most likely your cough symptoms are related to post nasal drainage, maybe with some reflux.  Continue ipratropium nasal spray. Can add nasal saline sprays in between if that helps.  Stop claritin and switch to xyzal (levocetirizine) Let u[place]now if fevers, chills, night sweats, weight loss, or if you're coughing up blood or enough sputum to fill up a tbsp or a small cup. These are signs of worsening pulmonary infection.  Continue oral appliance for OSA.  Continue prn albuterol  Continue PPI and switch to  H2 blocker as instructed by GI.   Return to Care: Return in about 4 months (around 05/06/2022).  NLenice Llamas MD Pulmonary and [place]

## 2022-01-05 NOTE — Progress Notes (Signed)
Called to room to assist during endoscopic procedure.  Patient ID and intended procedure confirmed with present staff. Received instructions for my participation in the procedure from the performing physician.  

## 2022-01-05 NOTE — Patient Instructions (Signed)
Resume previous diet and medications. Awaiting pathology results. Resume Plavix at prior dose tomorrow.   YOU HAD AN ENDOSCOPIC PROCEDURE TODAY AT [facility]:   Refer to the procedure report that was given to you for any specific questions about what was found during the examination.  If the procedure report does not answer your questions, please call your gastroenterologist to clarify.  If you requested that your care partner not be given the details of your procedure findings, then the procedure report has been included in a sealed envelope for you to review at your convenience later.  YOU SHOULD EXPECT: Some feelings of bloating in the abdomen. Passage of more gas than usual.  Walking can help get rid of the air that was put into your GI tract during the procedure and reduce the bloating. If you had a lower endoscopy (such as a colonoscopy or flexible sigmoidoscopy) you may notice spotting of blood in your stool or on the toilet paper. If you underwent a bowel prep for your procedure, you may not have a normal bowel movement for a few days.  Please Note:  You might notice some irritation and congestion in your nose or some drainage.  This is from the oxygen used during your procedure.  There is no need for concern and it should clear up in a day or so.  SYMPTOMS TO REPORT IMMEDIATELY:  Following upper endoscopy (EGD)  Vomiting of blood or coffee ground material  New chest pain or pain under the shoulder blades  Painful or persistently difficult swallowing  New shortness of breath  Fever of 100F or higher  Black, tarry-looking stools  For urgent or emergent issues, a gastroenterologist can be reached at any hour by calling [phone]. Do not use MyChart messaging for urgent concerns.    DIET:  We do recommend a small meal at first, but then you may proceed to your regular diet.  Drink plenty of fluids but you should avoid alcoholic beverages for 24 hours.  ACTIVITY:   You should plan to take it easy for the rest of today and you should NOT DRIVE or use heavy machinery until tomorrow (because of the sedation medicines used during the test).    FOLLOW UP: Our staff will call the number listed on your records the next business day following your procedure.  We will call around 7:15- 8:00 am to check on you and address any questions or concerns that you may have regarding the information given to you following your procedure. If we do not reach you, we will leave a message.     If any biopsies were taken you will be contacted by phone or by letter within the next 1-3 weeks.  Please call us at [phone] if you have not heard about the biopsies in 3 weeks.    SIGNATURES/CONFIDENTIALITY: You and/or your care partner have signed paperwork which will be entered into your electronic medical record.  These signatures attest to the fact that that the information above on your After Visit Summary has been reviewed and is understood.  Full responsibility of the confidentiality of this discharge information lies with you and/or your care-partner.

## 2022-01-05 NOTE — Progress Notes (Signed)
VS completed by DT.  Pt's states no medical or surgical changes since previsit or office visit.  

## 2022-01-06 ENCOUNTER — Other Ambulatory Visit (HOSPITAL_COMMUNITY): Payer: Self-pay

## 2022-01-06 ENCOUNTER — Telehealth: Payer: Self-pay | Admitting: *Deleted

## 2022-01-06 ENCOUNTER — Other Ambulatory Visit: Payer: Self-pay | Admitting: Internal Medicine

## 2022-01-06 ENCOUNTER — Telehealth: Payer: Self-pay

## 2022-01-06 ENCOUNTER — Other Ambulatory Visit: Payer: Self-pay

## 2022-01-06 ENCOUNTER — Encounter: Payer: Self-pay | Admitting: Internal Medicine

## 2022-01-06 DIAGNOSIS — E039 Hypothyroidism, unspecified: Secondary | ICD-10-CM

## 2022-01-06 DIAGNOSIS — E782 Mixed hyperlipidemia: Secondary | ICD-10-CM

## 2022-01-06 MED ORDER — ATORVASTATIN CALCIUM 40 MG PO TABS
40.0000 mg | ORAL_TABLET | Freq: Every day | ORAL | 0 refills | Status: DC
  Filled 2022-01-06: qty 90, fill #0
  Filled 2022-02-05: qty 90, 90d supply, fill #0

## 2022-01-06 MED ORDER — FAMOTIDINE 40 MG PO TABS
40.0000 mg | ORAL_TABLET | Freq: Every day | ORAL | 5 refills | Status: DC
  Filled 2022-01-06: qty 90, 90d supply, fill #0

## 2022-01-06 MED ORDER — OMEGA-3-ACID ETHYL ESTERS 1 G PO CAPS
2.0000 g | ORAL_CAPSULE | Freq: Two times a day (BID) | ORAL | 0 refills | Status: DC
  Filled 2022-01-06: qty 360, 90d supply, fill #0

## 2022-01-06 MED ORDER — LEVOCETIRIZINE DIHYDROCHLORIDE 5 MG PO TABS
5.0000 mg | ORAL_TABLET | Freq: Every evening | ORAL | 5 refills | Status: DC
  Filled 2022-01-06 – 2022-02-05 (×3): qty 90, 90d supply, fill #0
  Filled 2022-04-16 – 2022-06-04 (×3): qty 90, 90d supply, fill #1
  Filled 2022-08-06 – 2022-08-25 (×3): qty 90, 90d supply, fill #2
  Filled 2022-12-03: qty 90, 90d supply, fill #3

## 2022-01-06 MED ORDER — LEVOTHYROXINE SODIUM 25 MCG PO TABS
25.0000 ug | ORAL_TABLET | Freq: Every day | ORAL | 0 refills | Status: DC
  Filled 2022-01-06 – 2022-01-28 (×2): qty 90, 90d supply, fill #0

## 2022-01-06 NOTE — Telephone Encounter (Signed)
Pepcid sent pt's pharmacy. Pt verbalized understanding. Recall placed for office visit in 6 months.

## 2022-01-06 NOTE — Telephone Encounter (Signed)
Attempted to call patient for their post-procedure follow-up call. No answer. Left voicemail.   

## 2022-01-06 NOTE — Telephone Encounter (Signed)
-----   Message from Libby, DO sent at 01/05/2022 10:43 AM EST ----- Can you plaese send in Rx for Pepcid 40 mg qhs. To start next week when she stops the evening dose of Protonix. #90, RF5. Thanks  RTC in 6 months or sooner prn

## 2022-01-06 NOTE — Telephone Encounter (Signed)
Patient returned call, requesting a call back to discuss some concerns. Please advise.

## 2022-01-07 ENCOUNTER — Other Ambulatory Visit (HOSPITAL_COMMUNITY): Payer: Self-pay

## 2022-01-07 ENCOUNTER — Other Ambulatory Visit: Payer: Self-pay | Admitting: Internal Medicine

## 2022-01-07 ENCOUNTER — Ambulatory Visit (INDEPENDENT_AMBULATORY_CARE_PROVIDER_SITE_OTHER): Payer: Medicare PPO | Admitting: Internal Medicine

## 2022-01-07 ENCOUNTER — Encounter: Payer: Self-pay | Admitting: Internal Medicine

## 2022-01-07 VITALS — BP 120/70 | HR 55 | Temp 98.3°F | Ht 63.5 in | Wt 149.8 lb

## 2022-01-07 DIAGNOSIS — K254 Chronic or unspecified gastric ulcer with hemorrhage: Secondary | ICD-10-CM

## 2022-01-07 DIAGNOSIS — E559 Vitamin D deficiency, unspecified: Secondary | ICD-10-CM

## 2022-01-07 DIAGNOSIS — E782 Mixed hyperlipidemia: Secondary | ICD-10-CM

## 2022-01-07 DIAGNOSIS — Z0000 Encounter for general adult medical examination without abnormal findings: Secondary | ICD-10-CM | POA: Diagnosis not present

## 2022-01-07 MED ORDER — CLOPIDOGREL BISULFATE 75 MG PO TABS
75.0000 mg | ORAL_TABLET | Freq: Every day | ORAL | 0 refills | Status: DC
  Filled 2022-01-07: qty 30, 30d supply, fill #0

## 2022-01-07 NOTE — Progress Notes (Signed)
Cardiology Office Note:    Date:  01/21/2022   ID:  Beverly [name], DOB 23-May-1945, MRN [id number]  PCP:  Beverly [name], Rayford Halsted, MD  [place] [facility] Cardiologist:  [name], MD     Referring MD: Beverly [name], Beverly [name]*   Chief Complaint:  Follow-up     History of Present Illness:   Beverly [name] is a 76 y.o. female with  history of coronary calcification on CT 06/2019,SVT/nocturnal bradycardia, chest pain with normal NST 06/2019, HLD, OSA, ILD with severe tracheobronchomalacia followed by pulmonary. I saw the patient for preop evaluation for potential lumbar surgery on 05/28/21 Dr. Rolena Infante with abdominal exposure for OLIF at L3-L5 with Dr. Carlis Abbott.   I ordered Coronary CTA and this was suggestive of tight LAD and ramus. Surgery was canceled and she had cardiac cath 05/19/21 with DES LAD and DES ramus. Plan for Brilinta/ASA for at least 6 months. Counseled on taking celebrex but she can't stop b/c of pain. Low dose losartan added.    Patient seen in ED 08/20/21 with recurrent chest pain felt to be M-S and reflux related. EKG unchanged and troponins negative. No further w/u recommended.     She was admitted with acute GI bleed 10/21/21 and brilinta held. She was switched to plavix and will need to hold 5-6 days prior to elective back surgery.    Patient comes in for 6 month f/u. No cardiac complaints. Chronic DOE from ILD. Scheduled for back surgery 02/02/22. She's having trouble with constipation. Arthritis pain has increased since she can't take celebrex. Getting some exercise.           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   right sholder   BCC (basal cell carcinoma of skin) 05/09/2014   mid lower back   BCC  (basal cell carcinoma of skin) 05/03/2017   right low back   BCC (basal cell carcinoma of skin) 05/03/2017   left upper back   BCC (basal cell carcinoma of skin) 07/05/2018   left mid back   BCC (basal cell carcinoma of skin) 05/20/1992   upper back   BCC (basal cell carcinoma of skin) 07/29/1993   left sholder medial   BCC (basal cell carcinoma of skin) 07/29/1993   left sholder lateral   BCC (basal cell carcinoma of skin) 07/29/1993   right thigh   BCC (basal cell carcinoma of skin) 07/29/1993   right sholder   BCC (basal cell carcinoma of skin) 12/22/1994   right mid forearm   BCC (basal cell carcinoma of skin) 12/22/1994   right upper forearm   BCC (basal cell carcinoma of skin) 12/22/1994   lower right upper arm   BCC (basal cell carcinoma of skin) 12/22/1994   right upper arm sholder   BCC (basal cell carcinoma of skin) 08/11/1995   left leg below knee   BCC (basal cell carcinoma of skin) 04/11/2002   mid back   BCC (basal cell carcinoma of skin) 12/10/2002   right center upper back   BCC (basal cell carcinoma of skin) 05/26/2005   right post sholder   BCC (basal cell carcinoma) 05/09/2014   left inner shin  BCC (basal cell carcinoma) 06/12/2014   left forearm   Bowen's disease 10/07/1994   right post knee, right inner forearm/wrist   Bowen's disease 08/11/1995   left sholder   Bronchiectasis ([place])    Cataract    left   Chronic pain    Common variable immunodeficiency [facility])    sees dr Gayleen Orem 02-11-2021 epic   Coronary artery disease    Depression    hx of   Diverticulosis of colon 1998   mild   DJD (degenerative joint disease)    Duodenal ulcer    h/o yrs ago   GERD (gastroesophageal reflux disease)    History of SCC (squamous cell carcinoma) of skin    Dr. Denna Haggard   History of sinus bradycardia    HLD (hyperlipidemia)    hypertriglyceridemia   Hypertensive retinopathy of both eyes 11/18/2017   Hypothyroidism    IBS (irritable bowel syndrome)     Internal hemorrhoids 1998   Lymphocytic colitis    Mitral regurgitation    mild   Nocardiosis    relased by infection disease dec 2022   Osteoarthritis    feet,shoulder,neck,back,hips and hands.   Pneumonia 2015   PONV (postoperative nausea and vomiting)    Rotator cuff tear, right 02/2019   infraspinatus and supraspinatus, and dislocation of long head of bicep tendon (Dr. Alvan Dame)   SCC (squamous cell carcinoma) 11/26/2014   left hand, right hand, right deltoid mnay areas   SCC (squamous cell carcinoma) 05/03/2017   left cheek   Sleep apnea    uses a mouth guard nightly   Squamous cell carcinoma in situ (SCCIS) 07/05/2018   left hand   Tracheobronchomalacia    Vitamin D deficiency    mild   Current Medications: Current Meds  Medication Sig   acetaminophen (TYLENOL) 500 MG tablet Take 1,000 mg by mouth as needed for moderate pain.   acetaminophen (TYLENOL) 650 MG CR tablet Take 650 mg by mouth as needed for pain.   albuterol (VENTOLIN HFA) 108 (90 Base) MCG/ACT inhaler Inhale 2 puffs into the lungs every 6 (six) hours as needed for wheezing or shortness of breath. (Patient taking differently: Inhale 1 puff into the lungs as needed for shortness of breath or wheezing.)   amoxicillin (AMOXIL) 500 MG tablet    ARTIFICIAL TEAR OP Place 2 drops into both eyes in the morning and at bedtime.   atorvastatin (LIPITOR) 40 MG tablet Take 1 tablet (40 mg total) by mouth daily. (Patient taking differently: Take 40 mg by mouth at bedtime.)   azelastine (ASTELIN) 0.1 % nasal spray Place 2 sprays into both nostrils 2 (two) times daily. Use in each nostril as directed   budesonide (ENTOCORT EC) 3 MG 24 hr capsule Take 1 capsule (3 mg total) by mouth daily.   Buprenorphine HCl (BELBUCA) 750 MCG FILM Place 750 mcg film inside cheek to dissolve 2 (two) times daily.   Buprenorphine HCl (BELBUCA) 750 MCG FILM Place 1 Film inside cheek 2 (two) times daily.   Calcium Carbonate Antacid (TUMS PO) Take 1  tablet by mouth as needed (heartburn).   Calcium Carbonate-Vitamin D 600-200 MG-UNIT TABS Take 1 tablet by mouth 2 (two) times daily.   colestipol (COLESTID) 1 g tablet Take 1 tablet by mouth daily.   Cyanocobalamin (VITAMIN B-12 PO) Take 1 capsule by mouth daily.   diclofenac Sodium (VOLTAREN) 1 % GEL Apply 1 Application topically as needed (joint pain).   Docusate Sodium 100 MG capsule Take  100 mg by mouth in the morning and at bedtime.   EPINEPHrine 0.3 mg/0.3 mL IJ SOAJ injection Inject 0.3 mg into the muscle as needed for anaphylaxis.   famotidine (PEPCID) 40 MG tablet Take 1 tablet (40 mg total) by mouth at bedtime.   FIBER PO Take 2 tablets by mouth in the morning and at bedtime.   Fluorouracil (TOLAK) 4 % CREA Apply to aa total 28 applications & sun protect (Patient taking differently: Apply 1 Application topically daily. For two weeks during the winter)   gabapentin (NEURONTIN) 800 MG tablet Take 1 tablet by mouth at bedtime. (Patient taking differently: Take 800 mg by mouth at bedtime.)   guaiFENesin (MUCINEX) 600 MG 12 hr tablet Take 600 mg by mouth 2 (two) times daily.   HYDROcodone-acetaminophen (NORCO) 7.5-325 MG tablet Take 1 tablet by mouth 4 (four) times daily as needed for pain. (Patient taking differently: Take 1 tablet by mouth daily as needed for moderate pain.)   ipratropium (ATROVENT) 0.03 % nasal spray Place 1 spray into both nostrils 3 (three) times daily as needed for rhinitis.   Lactobacillus-Inulin (CULTURELLE ADULT ULT BALANCE) CAPS Take 1 capsule by mouth daily.   levocetirizine (XYZAL) 5 MG tablet Take 1 tablet (5 mg total) by mouth every evening. (Patient taking differently: Take 5 mg by mouth daily.)   levothyroxine (SYNTHROID) 25 MCG tablet Take 1 tablet (25 mcg total) by mouth daily.   melatonin 5 MG TABS Take 5 mg by mouth at bedtime.   methocarbamol (ROBAXIN) 500 MG tablet Take 1 tablet by mouth three times a day as needed (Patient taking differently: Take 500  mg by mouth 2 (two) times daily.)   montelukast (SINGULAIR) 10 MG tablet Take 1 tablet (10 mg total) by mouth daily.   Multiple Vitamins-Minerals (MULTIVITAMIN WITH MINERALS) tablet Take 1 tablet by mouth daily.   nitroGLYCERIN (NITROSTAT) 0.4 MG SL tablet Place 1 tablet (0.4 mg total) under the tongue every 5 (five) minutes as needed.   omega-3 acid ethyl esters (LOVAZA) 1 g capsule Take 2 capsules (2 g total) by mouth 2 (two) times daily.   ondansetron (ZOFRAN-ODT) 4 MG disintegrating tablet Take 1 tablet by mouth every 6 (six) hours as needed for nausea or vomiting. (Patient taking differently: Take 4 mg by mouth as needed for nausea or vomiting.)   oxyCODONE-acetaminophen (PERCOCET) 7.5-325 MG tablet Take 1 tablet by mouth every 4 (four) hours as needed for pain.  Take the hydrocodone/acetaminophen for surgery.   pantoprazole (PROTONIX) 40 MG tablet Take 1 tablet (40 mg) twice daily for 10 weeks then once a day as per GI recommendations (Patient taking differently: Take 40 mg by mouth daily.)   PARoxetine (PAXIL) 40 MG tablet Take 40 mg by mouth at bedtime.   polyethylene glycol (MIRALAX / GLYCOLAX) 17 g packet Take 17 g by mouth daily.   psyllium (METAMUCIL) 58.6 % packet Take 1 packet by mouth daily.   triamcinolone (NASACORT) 55 MCG/ACT AERO nasal inhaler Place 2 sprays into each nostril once daily.   XEMBIFY 10 GM/50ML SOLN Inject 10 g into the skin once a week. Wednesday   [DISCONTINUED] clopidogrel (PLAVIX) 75 MG tablet Take 1 tablet (75 mg total) by mouth daily.    Allergies:   Adhesive [tape] and Codeine   Social History   Tobacco Use   Smoking status: Never   Smokeless tobacco: Never  Vaping Use   Vaping Use: Never used  Substance Use Topics   Alcohol use: Yes  Alcohol/week: 7.0 standard drinks of alcohol    Types: 7 Glasses of wine per week    Comment: 1 glass wine per day   Drug use: No    Family Hx: The patient's family history includes ADD / ADHD in an other family  member; Asthma in her son; COPD in her sister; Cancer in her brother, father, and sister; Cancer (age of onset: 21) in her paternal grandmother; Colon cancer in her paternal grandmother; Depression in her mother; Diabetes in her maternal grandmother; Esophageal cancer in her father; Heart disease in her father; Hyperlipidemia in her daughter; Hypertension in her sister; Hyperthyroidism in her brother; Liver cancer in her paternal grandmother; Osteoporosis in her sister; Schizophrenia in her mother.  ROS     Physical Exam:    VS:  BP 120/60 (BP Location: Left Arm, Patient Position: Sitting, Cuff Size: Normal)   Pulse (!) 56   Ht '5\' 3"'$  (1.6 m)   Wt 150 lb (68 kg)   SpO2 95%   BMI 26.57 kg/m     Wt Readings from Last 3 Encounters:  01/21/22 150 lb (68 kg)  01/19/22 147 lb 12.8 oz (67 kg)  01/07/22 149 lb 12.8 oz (67.9 kg)    Physical Exam  GEN: Well nourished, well developed, in no acute distress  HEENT: normal  Neck: no JVD, carotid bruits, or masses Cardiac:RRR; no murmurs, rubs, or gallops  Respiratory:  clear to auscultation bilaterally, normal work of breathing GI: soft, nontender, nondistended, + BS Ext: without cyanosis, clubbing, or edema, Good distal pulses bilaterally MS: no deformity or atrophy  Skin: warm and dry, no rash Neuro:  Alert and Oriented x 3, Strength and sensation are intact Psych: euthymic mood, full affect        EKGs/Labs/Other Test Reviewed:    EKG:  EKG is not ordered today.     Recent Labs: 08/12/2021: TSH 1.33 11/06/2021: Magnesium 2.2 01/07/2022: Hemoglobin 12.1; Platelets CANCELED 01/19/2022: BUN 13; Creatinine, Ser 0.71; Potassium 4.5; Sodium 139   Recent Lipid Panel Recent Labs    01/07/22 1538  CHOL 147  TRIG 208*  HDL 45*  LDLCALC 72     Prior CV Studies:   Cath: 05/19/21   Conclusions: Severe two-vessel coronary artery disease with up to 90% stenoses involving the ostial through mid LAD and ostial through proximal ramus  intermedius.  Mild, nonobstructive CAD noted in the LCx and RCA. Normal left ventricular systolic function (LVEF 53-61%) with normal filling pressure (LVEDP 10 mmHg) Successful IVUS-guided PCI to ostial through mid LAD using Onyx Frontier 2.75 x 38 mm drug-eluting stent with 0% residual stenosis and TIMI-3 flow Successful PCI to ostial through proximal ramus intermedius using Onyx Frontier 2.0 x 22 mm drug-eluting stent with 0% residual stenosis and TIMI-3 flow.   Recommendations: Overnight extended recovery. Dual antiplatelet therapy with aspirin and clopidogrel, ideally for 6 months.  Nonemergent surgery will likely need to be delayed to allow for adequate antiplatelet therapy following two-vessel PCI. Aggressive secondary prevention.   Nelva Bush, MD [facility]   Diagnostic Dominance: Right Intervention         Echo from 06/23/19:    1. Left ventricular ejection fraction, by estimation, is 55 to 60%. The  left ventricle has normal function. The left ventricle has no regional  wall motion abnormalities. There is mild left ventricular hypertrophy.  Left ventricular diastolic function  could not be evaluated.   2. Right ventricular systolic function is normal. The right ventricular  size  is normal.   3. The mitral valve is grossly normal. Trivial mitral valve  regurgitation. No evidence of mitral stenosis.   4. The aortic valve has an indeterminant number of cusps. Aortic valve  regurgitation is not visualized. Mild aortic valve sclerosis is present,  with no evidence of aortic valve stenosis.      Risk Assessment/Calculations/Metrics:              ASSESSMENT & PLAN:   No problem-specific Assessment & Plan notes found for this encounter.   CAD cardiac cath 05/19/21 with DES LAD and DES ramus. Plan for Plavix alone after GI bleed. recurrent chest pain and seen in ED 08/20/21-felt to be atypical and no further w/u needed. -no further chest pain. Has already been cleared  for upcoming back surgery 02/02/22.  GI bleed now on Plavix, no ASA -no further bleeding problems   ILD-with chronic DOE   SVT/nocturnal bradycardia-no recent symptoms   HLD LDL 72 on lipitor- trig up but non-fasting lab 12/2021   Tracheobronchomalacia  OSA with mouth guard.               Dispo:  No follow-ups on file.   Medication Adjustments/Labs and Tests Ordered: Current medicines are reviewed at length with the patient today.  Concerns regarding medicines are outlined above.  Tests Ordered: No orders of the defined types were placed in this encounter.  Medication Changes: Meds ordered this encounter  Medications   clopidogrel (PLAVIX) 75 MG tablet    Sig: Take 1 tablet (75 mg total) by mouth daily.    Dispense:  90 tablet    Refill:  3   Signed, Ermalinda Barrios, PA-C  01/21/2022 12:01 PM    [address], [place], [place]  [address] Phone: [phone]; Fax: [phone]

## 2022-01-07 NOTE — Telephone Encounter (Signed)
Last filled by Alekh, Kshitiz, MD  

## 2022-01-07 NOTE — Progress Notes (Signed)
Established Patient Office Visit     CC/Reason for Visit: Annual preventive exam and subsequent Medicare wellness visit  HPI: Beverly [name] is a 76 y.o. female who is coming in today for the above mentioned reasons. Past Medical History is significant for:  Obstructive sleep apnea, hyperlipidemia, chronic pain syndrome, asthma, GERD, CIVD with hypoglycemic globule anemia, lymphocytic colitis and vitamin D deficiency.   Earlier this year she was hospitalized for hematemesis and acute blood loss anemia due to gastric ulcers that were thought related to Celebrex.  She had a repeat endoscopy this week that showed healing of the ulcer.  She tells me she will have back surgery with Dr. Rolena Infante in December.  She has routine eye and dental care, no perceived hearing issues.  All immunizations are up-to-date.   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   right sholder   BCC (basal cell carcinoma of skin) 05/09/2014   mid lower back   BCC (basal cell carcinoma of skin) 05/03/2017   right low back   BCC (basal cell carcinoma of skin) 05/03/2017   left upper back   BCC (basal cell carcinoma of skin) 07/05/2018   left mid back   BCC (basal cell carcinoma of skin) 05/20/1992   upper back   BCC (basal cell carcinoma of skin) 07/29/1993   left sholder medial   BCC (basal cell carcinoma of skin) 07/29/1993   left sholder lateral   BCC (basal cell carcinoma of skin) 07/29/1993   right thigh   BCC (basal cell carcinoma of skin) 07/29/1993   right sholder   BCC (basal cell carcinoma of skin) 12/22/1994   right mid forearm   BCC (basal cell carcinoma of skin) 12/22/1994   right upper forearm   BCC (basal cell  carcinoma of skin) 12/22/1994   lower right upper arm   BCC (basal cell carcinoma of skin) 12/22/1994   right upper arm sholder   BCC (basal cell carcinoma of skin) 08/11/1995   left leg below knee   BCC (basal cell carcinoma of skin) 04/11/2002   mid back   BCC (basal cell carcinoma of skin) 12/10/2002   right center upper back   BCC (basal cell carcinoma of skin) 05/26/2005   right post sholder   BCC (basal cell carcinoma) 05/09/2014   left inner shin   BCC (basal cell carcinoma) 06/12/2014   left forearm   Bowen's disease 10/07/1994   right post knee, right inner forearm/wrist   Bowen's disease 08/11/1995   left sholder   Bronchiectasis ([place])    Cataract    left   Chronic pain    Common variable immunodeficiency [facility])    sees dr Gayleen Orem 02-11-2021 epic   Coronary artery disease    Depression    hx of   Diverticulosis of colon 1998   mild   DJD (degenerative joint disease)    Duodenal ulcer    h/o yrs ago   GERD (gastroesophageal reflux disease)    History of SCC (squamous cell carcinoma) of skin    Dr. Denna Haggard   History of sinus bradycardia    HLD (hyperlipidemia)  hypertriglyceridemia   Hypertensive retinopathy of both eyes 11/18/2017   Hypothyroidism    IBS (irritable bowel syndrome)    Internal hemorrhoids 1998   Lymphocytic colitis    Mitral regurgitation    mild   Nocardiosis    relased by infection disease dec 2022   Osteoarthritis    feet,shoulder,neck,back,hips and hands.   Pneumonia 2015   PONV (postoperative nausea and vomiting)    Rotator cuff tear, right 02/2019   infraspinatus and supraspinatus, and dislocation of long head of bicep tendon (Dr. Alvan Dame)   SCC (squamous cell carcinoma) 11/26/2014   left hand, right hand, right deltoid mnay areas   SCC (squamous cell carcinoma) 05/03/2017   left cheek   Sleep apnea    uses a mouth guard nightly   Squamous cell carcinoma in situ (SCCIS) 07/05/2018   left hand   Tracheobronchomalacia     Vitamin D deficiency    mild    Past Surgical History:  Procedure Laterality Date   ABDOMINAL HYSTERECTOMY  1998   complete   BIOPSY  11/05/2021   Procedure: BIOPSY;  Surgeon: Thornton Park, MD;  Location: [facility] ENDOSCOPY;  Service: Gastroenterology;;   BLEPHAROPLASTY Bilateral 01/2018   BROW LIFT Bilateral 10/10/2018   Procedure: Delphina Cahill LIFT;  Surgeon: Wallace Going, DO;  Location: [place];  Service: Plastics;  Laterality: Bilateral;   BUNIONECTOMY     R 12/08, L 2004 (Dr. Janus Molder)   [place]   bilateral   CARPAL TUNNEL RELEASE Left 03/06/2021   Procedure: Left Revision Carpal Tunnel Release with hypothenar fat pad flap;  Surgeon: [name], MD;  Location: [place];  Service: Orthopedics;  Laterality: Left;  with local anesthesia   CATARACT EXTRACTION Bilateral Left in 08/2012, Right 04/2016   Dr.Hecker   CESAREAN SECTION     1981 and [place]   laparoscopic   CLOSED REDUCTION NASAL FRACTURE N/A 08/29/2018   Procedure: CLOSED REDUCTION NASAL FRACTURE;  Surgeon: Wallace Going, DO;  Location: [place];  Service: Plastics;  Laterality: N/A;  1 hour, please   COLONOSCOPY  2006   COLONOSCOPY  01/2012   due again 01/2021; mild diverticulosis   CORONARY STENT INTERVENTION N/A 05/19/2021   Procedure: CORONARY STENT INTERVENTION;  Surgeon: Nelva Bush, MD;  Location: [place] CV [facility];  Service: Cardiovascular;  Laterality: N/A;   epidural steroid injection, back  02/2010   ESOPHAGOGASTRODUODENOSCOPY (EGD) WITH PROPOFOL N/A 11/05/2021   Procedure: ESOPHAGOGASTRODUODENOSCOPY (EGD) WITH PROPOFOL;  Surgeon: Thornton Park, MD;  Location: [place];  Service: Gastroenterology;  Laterality: N/A;   HIP SURGERY     right bursectomy x 3   HIP SURGERY Right 2000   torn cartilage, repaired   INGUINAL HERNIA REPAIR  10/2007   bilat    INTRAVASCULAR ULTRASOUND/IVUS N/A 05/19/2021   Procedure: Intravascular Ultrasound/IVUS;  Surgeon: Nelva Bush, MD;  Location: [place] [facility];  Service: Cardiovascular;  Laterality: N/A;   LEFT HEART CATH AND CORONARY ANGIOGRAPHY N/A 05/19/2021   Procedure: LEFT HEART CATH AND CORONARY ANGIOGRAPHY;  Surgeon: Nelva Bush, MD;  Location: [place] [facility];  Service: Cardiovascular;  Laterality: N/A;   NECK SURGERY  1989   c6-7 cervical laminectomy and diskecotmy   REVERSE SHOULDER ARTHROPLASTY Right 04/11/2019   Procedure: REVERSE SHOULDER ARTHROPLASTY;  Surgeon: Nicholes Stairs, MD;  Location: [place];  Service: Orthopedics;  Laterality: Right;  Regional  Block   SHOULDER SURGERY Left 03/2003   left rotator cuff repair   SHOULDER SURGERY Left 03/14/2018   rotator cuff repair; Dr. Tonita Cong   TONSILLECTOMY  age 40   TROCHANTERIC BURSA EXCISION Right [place]   UPPER GASTROINTESTINAL ENDOSCOPY     WISDOM TOOTH EXTRACTION     as teenager    Social History:  reports that she has never smoked. She has never used smokeless tobacco. She reports current alcohol use of about 7.0 standard drinks of alcohol per week. She reports that she does not use drugs.  Allergies: Allergies  Allergen Reactions   Adhesive [Tape] Rash   Codeine Rash    Family History:  Family History  Problem Relation Age of Onset   Depression Mother    Schizophrenia Mother    Cancer Father        oral   Heart disease Father        bradycardia   Esophageal cancer Father    Cancer Sister        skin and lung   COPD Sister    Hypertension Sister    Osteoporosis Sister        compression fx's x 3 11/2018   Hyperthyroidism Brother    Cancer Brother        metastatic cancer to bone (?primary)   Diabetes Maternal Grandmother    Cancer Paternal Grandmother 40       colon cancer, metastatic to liver   Colon cancer Paternal Grandmother    Liver cancer Paternal Grandmother     Hyperlipidemia Daughter    Asthma Son    ADD / ADHD Other      Current Outpatient Medications:    acetaminophen (TYLENOL) 500 MG tablet, Take 1,000 mg by mouth every 6 (six) hours as needed for moderate pain. , Disp: , Rfl:    acetaminophen (TYLENOL) 650 MG CR tablet, SMARTSIG:1 Tablet(s) By Mouth, Disp: , Rfl:    albuterol (VENTOLIN HFA) 108 (90 Base) MCG/ACT inhaler, Inhale 2 puffs into the lungs every 6 (six) hours as needed for wheezing or shortness of breath., Disp: 18 g, Rfl: 1   amoxicillin (AMOXIL) 500 MG tablet, Take 4 tablet(s) 1-2 hours prior to dental work by oral route., Disp: , Rfl:    atorvastatin (LIPITOR) 40 MG tablet, Take 1 tablet (40 mg total) by mouth daily., Disp: 90 tablet, Rfl: 0   budesonide (ENTOCORT EC) 3 MG 24 hr capsule, Take 1 capsule (3 mg total) by mouth daily., Disp: 90 capsule, Rfl: 5   Buprenorphine HCl (BELBUCA) 750 MCG FILM, Place 750 mcg film inside cheek to dissolve 2 (two) times daily., Disp: 60 each, Rfl: 2   Calcium Carbonate-Vitamin D 600-200 MG-UNIT TABS, Take 1 tablet by mouth 2 (two) times daily., Disp: , Rfl:    clopidogrel (PLAVIX) 75 MG tablet, Take 1 tablet (75 mg total) by mouth daily., Disp: 30 tablet, Rfl: 0   colestipol (COLESTID) 1 g tablet, Take 1 tablet by mouth daily., Disp: 90 tablet, Rfl: 1   diclofenac Sodium (VOLTAREN) 1 % GEL, Apply 2 g topically 4 (four) times daily as needed (joint pain)., Disp: , Rfl:    Docusate Sodium 100 MG capsule, Take 100 mg by mouth daily., Disp: , Rfl:    EPINEPHrine 0.3 mg/0.3 mL IJ SOAJ injection, Inject 0.3 mg into the muscle as needed for anaphylaxis., Disp: , Rfl:    famotidine (PEPCID) 40 MG tablet, Take 1 tablet (40  mg total) by mouth at bedtime., Disp: 90 tablet, Rfl: 5   Fluorouracil (TOLAK) 4 % CREA, Apply to aa total 28 applications & sun protect, Disp: 40 g, Rfl: 0   gabapentin (NEURONTIN) 800 MG tablet, Take 1 tablet by mouth at bedtime. (Patient taking differently: Take 800 mg by mouth at  bedtime.), Disp: 90 tablet, Rfl: 2   Guaifenesin 1200 MG TB12, Take 1,200 mg by mouth 2 (two) times daily., Disp: , Rfl:    HYDROcodone-acetaminophen (NORCO) 7.5-325 MG tablet, Take 1 tablet by mouth 4 (four) times daily as needed for pain., Disp: 120 tablet, Rfl: 0   ipratropium (ATROVENT) 0.03 % nasal spray, Place 1 spray into both nostrils 3 (three) times daily., Disp: 30 mL, Rfl: 5   Lactobacillus-Inulin (CULTURELLE ADULT ULT BALANCE) CAPS, Take 1 capsule by mouth daily., Disp: , Rfl:    levocetirizine (XYZAL) 5 MG tablet, Take 1 tablet (5 mg total) by mouth every evening., Disp: 90 tablet, Rfl: 5   levothyroxine (SYNTHROID) 25 MCG tablet, Take 1 tablet (25 mcg total) by mouth daily., Disp: 90 tablet, Rfl: 0   loratadine (CLARITIN) 10 MG tablet, Take 1 tablet (10 mg total) by mouth daily as needed for allergies (Can take an extra dose during flare ups.)., Disp: 60 tablet, Rfl: 5   Melatonin-Pyridoxine (MELATIN PO), Take 5 mg by mouth at bedtime., Disp: , Rfl:    methocarbamol (ROBAXIN) 500 MG tablet, Take 1 tablet by mouth three times a day as needed (Patient taking differently: Take 500 mg by mouth 2 (two) times daily.), Disp: 270 tablet, Rfl: 2   montelukast (SINGULAIR) 10 MG tablet, Take 1 tablet (10 mg total) by mouth at bedtime. (Patient taking differently: Take 10 mg by mouth daily.), Disp: 90 tablet, Rfl: 1   Multiple Vitamins-Minerals (MULTIVITAMIN WITH MINERALS) tablet, Take 1 tablet by mouth daily., Disp: , Rfl:    nitroGLYCERIN (NITROSTAT) 0.4 MG SL tablet, Place 1 tablet (0.4 mg total) under the tongue every 5 (five) minutes as needed., Disp: 25 tablet, Rfl: 2   omega-3 acid ethyl esters (LOVAZA) 1 g capsule, Take 2 capsules (2 g total) by mouth 2 (two) times daily., Disp: 360 capsule, Rfl: 0   ondansetron (ZOFRAN-ODT) 4 MG disintegrating tablet, Take 1 tablet by mouth every 6 (six) hours as needed for nausea or vomiting., Disp: 30 tablet, Rfl: 1   pantoprazole (PROTONIX) 40 MG tablet,  Take 1 tablet (40 mg) twice daily for 10 weeks then once a day as per GI recommendations, Disp: 60 tablet, Rfl: 1   PARoxetine (PAXIL) 30 MG tablet, Take 1 tablet (30 mg total) by mouth daily., Disp: 90 tablet, Rfl: 1   PARoxetine (PAXIL) 40 MG tablet, Take 1 tablet by mouth daily., Disp: , Rfl:    triamcinolone (NASACORT) 55 MCG/ACT AERO nasal inhaler, Place 2 sprays into each nostril once daily., Disp: 16.5 g, Rfl: 5   XEMBIFY 10 GM/50ML SOLN, Inject 10 g into the skin once a week. Wednesday, Disp: , Rfl:    COVID-19 mRNA Virus Vaccine (COMIRNATY IM), Inject into the muscle., Disp: , Rfl:   Review of Systems:  Constitutional: Denies fever, chills, diaphoresis, appetite change and fatigue.  HEENT: Denies photophobia, eye pain, redness, hearing loss, ear pain, congestion, sore throat, rhinorrhea, sneezing, mouth sores, trouble swallowing, neck pain, neck stiffness and tinnitus.   Respiratory: Denies SOB, DOE, cough, chest tightness,  and wheezing.   Cardiovascular: Denies chest pain, palpitations and leg swelling.  Gastrointestinal: Denies nausea, vomiting,  abdominal pain, diarrhea, constipation, blood in stool and abdominal distention.  Genitourinary: Denies dysuria, urgency, frequency, hematuria, flank pain and difficulty urinating.  Endocrine: Denies: hot or cold intolerance, sweats, changes in hair or nails, polyuria, polydipsia. Musculoskeletal: Positive for myalgias, back pain, joint swelling, arthralgias and gait problem.  Skin: Denies pallor, rash and wound.  Neurological: Denies dizziness, seizures, syncope, weakness, light-headedness, numbness and headaches.  Hematological: Denies adenopathy. Easy bruising, personal or family bleeding history  Psychiatric/Behavioral: Denies suicidal ideation, mood changes, confusion, nervousness, sleep disturbance and agitation    Physical Exam: Vitals:   01/07/22 1459  BP: 120/70  Pulse: (!) 55  Temp: 98.3 F (36.8 C)  TempSrc: Oral  SpO2:  97%  Weight: 149 lb 12.8 oz (67.9 kg)  Height: 5' 3.5" (1.613 m)    Body mass index is 26.12 kg/m.   Constitutional: NAD, calm, comfortable Eyes: PERRL, lids and conjunctivae normal ENMT: Mucous membranes are moist. Posterior pharynx clear of any exudate or lesions. Normal dentition. Tympanic membrane is pearly white, no erythema or bulging. Neck: normal, supple, no masses, no thyromegaly Respiratory: clear to auscultation bilaterally, no wheezing, no crackles. Normal respiratory effort. No accessory muscle use.  Cardiovascular: Regular rate and rhythm, no murmurs / rubs / gallops. No extremity edema. 2+ pedal pulses. No carotid bruits.  Abdomen: no tenderness, no masses palpated. No hepatosplenomegaly. Bowel sounds positive.  Musculoskeletal: no clubbing / cyanosis. No joint deformity upper and lower extremities. Good ROM, no contractures. Normal muscle tone.  Skin: no rashes, lesions, ulcers. No induration Neurologic: CN 2-12 grossly intact. Sensation intact, DTR normal. Strength 5/5 in all 4.  Psychiatric: Normal judgment and insight. Alert and oriented x 3. Normal mood.   Subsequent Medicare wellness visit   1. Risk factors, based on past  M,S,F -cardiovascular disease risk factors include age, history of hyperlipidemia   2.  Physical activities: Remains active   3.  Depression/mood: Stable, not depressed   4.  Hearing: No perceived issues   5.  ADL's: Independent in all ADLs   6.  Fall risk: Low fall risk   7.  Home safety: No problems identified   8.  Height weight, and visual acuity: height and weight as above, vision:  Vision Screening   Right eye Left eye Both eyes  Without correction 20/32 20/32   With correction        9.  Counseling: Advised to update age-appropriate cancer screening when appropriate   10. Lab orders based on risk factors: Laboratory update will be reviewed   11. Referral : None today   12. Care plan: Follow-up with me in 6 months    13. Cognitive assessment: No cognitive impairment   14. Screening: Patient provided with a written and personalized 5-10 year screening schedule in the AVS. yes   15. Provider List Update: PCP, gastroenterologist, pulmonologist, allergist, pain management specialist  16. Advance Directives: Full code   17. Opioids: Patient is on chronic opioids. She has a signed pain contract with her pain management specialist, Nicholaus Bloom.. Has not displayed any signs of an opioid-use disorder.   [place] Office Visit from 01/07/2022 in [facility] [place]  PHQ-9 Total Score 0          11/05/2021   10:00 PM 11/10/2021    1:07 PM 01/03/2022    9:19 AM 01/06/2022    2:29 PM 01/07/2022    2:58 PM  Fall Risk  Falls in the past year?  '1 1 1 '$ 1  Was there an injury with Fall?  0 0 0 0  Fall Risk Category Calculator  '2 2 2 2  '$ Fall Risk Category  Moderate Moderate Moderate Moderate  Patient Fall Risk Level Moderate fall risk    Low fall risk  Patient at Risk for Falls Due to     History of fall(s)  Fall risk Follow up     Falls evaluation completed     Impression and Plan:  Encounter for preventive health examination  Mixed hyperlipidemia - Plan: Lipid panel  Vitamin D deficiency - Plan: VITAMIN D 25 Hydroxy (Vit-D Deficiency, Fractures)  Gastrointestinal hemorrhage associated with gastric ulcer - Plan: CBC with Differential/Platelet  -Recommend routine eye and dental care. -Immunizations: All immunizations are up-to-date including flu, COVID, RSV -Healthy lifestyle discussed in detail. -Labs to be updated today. -Colon cancer screening: 05/2020 -Breast cancer screening: 01/2021 -Cervical cancer screening: Declines due to age -Lung cancer screening: Not applicable -Prostate cancer screening: Not applicable -DEXA: 03/9474       Lelon Frohlich, MD [facility] at [facility]

## 2022-01-08 LAB — CBC WITH DIFFERENTIAL/PLATELET
Absolute Monocytes: 564 cells/uL (ref 200–950)
Basophils Absolute: 29 cells/uL (ref 0–200)
Basophils Relative: 0.5 %
Eosinophils Absolute: 51 cells/uL (ref 15–500)
Eosinophils Relative: 0.9 %
HCT: 36.4 % (ref 35.0–45.0)
Hemoglobin: 12.1 g/dL (ref 11.7–15.5)
Lymphs Abs: 1117 cells/uL (ref 850–3900)
MCH: 31.5 pg (ref 27.0–33.0)
MCHC: 33.2 g/dL (ref 32.0–36.0)
MCV: 94.8 fL (ref 80.0–100.0)
Monocytes Relative: 9.9 %
Neutro Abs: 3939 cells/uL (ref 1500–7800)
Neutrophils Relative %: 69.1 %
RBC: 3.84 10*6/uL (ref 3.80–5.10)
RDW: 14.8 % (ref 11.0–15.0)
Total Lymphocyte: 19.6 %
WBC: 5.7 10*3/uL (ref 3.8–10.8)

## 2022-01-08 LAB — LIPID PANEL
Cholesterol: 147 mg/dL (ref <200)
HDL: 45 mg/dL — ABNORMAL LOW (ref >=50)
LDL Cholesterol (Calc): 72 mg/dL (calc)
Non-HDL Cholesterol (Calc): 102 mg/dL (calc) (ref <130)
Total CHOL/HDL Ratio: 3.3 (calc) (ref <5.0)
Triglycerides: 208 mg/dL — ABNORMAL HIGH (ref <150)

## 2022-01-08 LAB — VITAMIN D 25 HYDROXY (VIT D DEFICIENCY, FRACTURES): Vit D, 25-Hydroxy: 37 ng/mL (ref 30–100)

## 2022-01-16 HISTORY — PX: LUMBAR FUSION: SHX111

## 2022-01-16 NOTE — Pre-Procedure Instructions (Addendum)
Surgical Instructions    Your procedure is scheduled on Monday, February 02, 2022 at 7:30 AM.  Report to [facility] Main Entrance "A" at 5:30 A.M., then check in with the Admitting office.  Call this number if you have problems the morning of surgery:  [phone]   If you have any questions prior to your surgery date call [phone]: Open Monday-Friday 8am-4pm  *If you experience any cold or flu symptoms such as cough, fever, chills, shortness of breath, etc. between now and your scheduled surgery, please notify us.*    Remember:  Do not eat after midnight the night before your surgery  You may drink clear liquids until 4:30 AM the morning of your surgery.   Clear liquids allowed are: Water, Non-Citrus Juices (without pulp), Carbonated Beverages, Clear Tea, Black Coffee Only (NO MILK, CREAM OR POWDERED CREAMER of any kind), and Gatorade.    Take these medicines the morning of surgery with A SIP OF WATER:  atorvastatin (LIPITOR)  budesonide (ENTOCORT EC)  Buprenorphine HCl (BELBUCA)  colestipol (COLESTID)  Guaifenesin  ipratropium (ATROVENT)  levothyroxine (SYNTHROID)  methocarbamol (ROBAXIN)  montelukast (SINGULAIR)  pantoprazole (PROTONIX)  PARoxetine (PAXIL)  triamcinolone (NASACORT)   IF NEEDED: acetaminophen (TYLENOL)  albuterol (VENTOLIN HFA)  EPINEPHrine  HYDROcodone-acetaminophen (NORCO)  loratadine (CLARITIN)  nitroGLYCERIN (NITROSTAT)  ondansetron (ZOFRAN-ODT)   Please bring all inhalers with you the day of surgery.   Follow your surgeon's instructions on when to stop clopidogrel (PLAVIX).  If no instructions were given by your surgeon then you will need to call the office to get those instructions.     As of today, STOP taking any Aspirin (unless otherwise instructed by your surgeon) Aleve, Naproxen, Ibuprofen, Motrin, Advil, Goody's, BC's, all herbal medications, fish oil, and all vitamins. This includes diclofenac Sodium (VOLTAREN) 1 % GEL.                      Do NOT Smoke (Tobacco/Vaping) for 24 hours prior to your procedure.  If you use a CPAP at night, you may bring your mask/headgear for your overnight stay.   Contacts, glasses, piercing's, hearing aid's, dentures or partials may not be worn into surgery, please bring cases for these belongings.    For patients admitted to the hospital, discharge time will be determined by your treatment team.   Patients discharged the day of surgery will not be allowed to drive home, and someone needs to stay with them for 24 hours.  SURGICAL WAITING ROOM VISITATION Patients having surgery or a procedure may have two support people in the waiting area. Visitors may stay in the waiting area during the procedure and switch out with other visitors if needed. Children under the age of 28 must have an adult accompany them who is not the patient. If the patient needs to stay at the hospital during part of their recovery, the visitor guidelines for inpatient rooms apply.  Please refer to the [facility] website for the visitor guidelines for Inpatients (after your surgery is over and you are in a regular room).    Special instructions:   [place]- Preparing For Surgery  Before surgery, you can play an important role. Because skin is not sterile, your skin needs to be as free of germs as possible. You can reduce the number of germs on your skin by washing with CHG (chlorahexidine gluconate) Soap before surgery.  CHG is an antiseptic cleaner which kills germs and bonds with the skin to continue  killing germs even after washing.    Oral Hygiene is also important to reduce your risk of infection.  Remember - BRUSH YOUR TEETH THE MORNING OF SURGERY WITH YOUR REGULAR TOOTHPASTE  Please do not use if you have an allergy to CHG or antibacterial soaps. If your skin becomes reddened/irritated stop using the CHG.  Do not shave (including legs and underarms) for at least 48 hours prior to first CHG shower. It  is OK to shave your face.  Please follow these instructions carefully.   Shower the NIGHT BEFORE SURGERY and the MORNING OF SURGERY  If you chose to wash your hair, wash your hair first as usual with your normal shampoo.  After you shampoo, rinse your hair and body thoroughly to remove the shampoo.  Use CHG Soap as you would any other liquid soap. You can apply CHG directly to the skin and wash gently with a scrungie or a clean washcloth.   Apply the CHG Soap to your body ONLY FROM THE NECK DOWN.  Do not use on open wounds or open sores. Avoid contact with your eyes, ears, mouth and genitals (private parts). Wash Face and genitals (private parts)  with your normal soap.   Wash thoroughly, paying special attention to the area where your surgery will be performed.  Thoroughly rinse your body with warm water from the neck down.  DO NOT shower/wash with your normal soap after using and rinsing off the CHG Soap.  Pat yourself dry with a CLEAN TOWEL.  Wear CLEAN PAJAMAS to bed the night before surgery  Place CLEAN SHEETS on your bed the night before your surgery  DO NOT SLEEP WITH PETS.   Day of Surgery: Take a shower with CHG soap. Do not wear jewelry or makeup Do not wear lotions, powders, perfumes/colognes, or deodorant. Do not shave 48 hours prior to surgery. Do not wear nail polish, gel polish, artificial nails, or any other type of covering on natural nails (fingers and toes) If you have artificial nails or gel coating that need to be removed by a nail salon, please have this removed prior to surgery. Artificial nails or gel coating may interfere with anesthesia's ability to adequately monitor your vital signs. Wear Clean/Comfortable clothing the morning of surgery Do not bring valuables to the hospital.  [facility] is not responsible for any belongings or valuables. Remember to brush your teeth WITH YOUR REGULAR TOOTHPASTE.   Please read over the following fact sheets that  you were given.  If you received a COVID test during your pre-op visit  it is requested that you wear a mask when out in public, stay away from anyone that may not be feeling well and notify your surgeon if you develop symptoms. If you have been in contact with anyone that has tested positive in the last 10 days please notify you surgeon.

## 2022-01-19 ENCOUNTER — Encounter (HOSPITAL_COMMUNITY)
Admission: RE | Admit: 2022-01-19 | Discharge: 2022-01-19 | Disposition: A | Discharge status: Home / Self Care | Payer: Medicare PPO | Source: Ambulatory Visit | Attending: Orthopedic Surgery | Admitting: Orthopedic Surgery

## 2022-01-19 ENCOUNTER — Encounter (HOSPITAL_COMMUNITY): Payer: Self-pay

## 2022-01-19 ENCOUNTER — Other Ambulatory Visit (HOSPITAL_COMMUNITY): Payer: Self-pay

## 2022-01-19 ENCOUNTER — Other Ambulatory Visit: Payer: Self-pay | Admitting: Allergy and Immunology

## 2022-01-19 ENCOUNTER — Other Ambulatory Visit: Payer: Self-pay

## 2022-01-19 VITALS — BP 129/65 | HR 54 | Temp 98.0°F | Resp 16 | Ht 63.0 in | Wt 147.8 lb

## 2022-01-19 DIAGNOSIS — K5710 Diverticulosis of small intestine without perforation or abscess without bleeding: Secondary | ICD-10-CM | POA: Diagnosis not present

## 2022-01-19 DIAGNOSIS — R0683 Snoring: Secondary | ICD-10-CM | POA: Insufficient documentation

## 2022-01-19 DIAGNOSIS — I517 Cardiomegaly: Secondary | ICD-10-CM | POA: Insufficient documentation

## 2022-01-19 DIAGNOSIS — Z85828 Personal history of other malignant neoplasm of skin: Secondary | ICD-10-CM | POA: Diagnosis not present

## 2022-01-19 DIAGNOSIS — K2970 Gastritis, unspecified, without bleeding: Secondary | ICD-10-CM | POA: Insufficient documentation

## 2022-01-19 DIAGNOSIS — E781 Pure hyperglyceridemia: Secondary | ICD-10-CM | POA: Diagnosis not present

## 2022-01-19 DIAGNOSIS — Z8719 Personal history of other diseases of the digestive system: Secondary | ICD-10-CM | POA: Diagnosis not present

## 2022-01-19 DIAGNOSIS — J45909 Unspecified asthma, uncomplicated: Secondary | ICD-10-CM | POA: Diagnosis not present

## 2022-01-19 DIAGNOSIS — K219 Gastro-esophageal reflux disease without esophagitis: Secondary | ICD-10-CM | POA: Diagnosis not present

## 2022-01-19 DIAGNOSIS — Z955 Presence of coronary angioplasty implant and graft: Secondary | ICD-10-CM | POA: Diagnosis not present

## 2022-01-19 DIAGNOSIS — M4180 Other forms of scoliosis, site unspecified: Secondary | ICD-10-CM | POA: Diagnosis not present

## 2022-01-19 DIAGNOSIS — E039 Hypothyroidism, unspecified: Secondary | ICD-10-CM | POA: Diagnosis not present

## 2022-01-19 DIAGNOSIS — Z9049 Acquired absence of other specified parts of digestive tract: Secondary | ICD-10-CM | POA: Diagnosis not present

## 2022-01-19 DIAGNOSIS — I2510 Atherosclerotic heart disease of native coronary artery without angina pectoris: Secondary | ICD-10-CM | POA: Diagnosis not present

## 2022-01-19 DIAGNOSIS — I700 Atherosclerosis of aorta: Secondary | ICD-10-CM | POA: Insufficient documentation

## 2022-01-19 DIAGNOSIS — K589 Irritable bowel syndrome without diarrhea: Secondary | ICD-10-CM | POA: Insufficient documentation

## 2022-01-19 DIAGNOSIS — Z01812 Encounter for preprocedural laboratory examination: Secondary | ICD-10-CM | POA: Insufficient documentation

## 2022-01-19 DIAGNOSIS — F32A Depression, unspecified: Secondary | ICD-10-CM | POA: Insufficient documentation

## 2022-01-19 DIAGNOSIS — Z01818 Encounter for other preprocedural examination: Secondary | ICD-10-CM

## 2022-01-19 LAB — BASIC METABOLIC PANEL
Anion gap: 3 — ABNORMAL LOW (ref 5–15)
BUN: 13 mg/dL (ref 8–23)
CO2: 31 mmol/L (ref 22–32)
Calcium: 9.1 mg/dL (ref 8.9–10.3)
Chloride: 105 mmol/L (ref 98–111)
Creatinine, Ser: 0.71 mg/dL (ref 0.44–1.00)
GFR, Estimated: >60 mL/min (ref >60)
Glucose, Bld: 93 mg/dL (ref 70–99)
Potassium: 4.5 mmol/L (ref 3.5–5.1)
Sodium: 139 mmol/L (ref 135–145)

## 2022-01-19 LAB — SURGICAL PCR SCREEN
MRSA, PCR: NEGATIVE
Staphylococcus aureus: NEGATIVE

## 2022-01-19 NOTE — Progress Notes (Signed)
PCP - Dr. Leotis Shames Cardiologist - Dr. [name] Immunologist: Dr. Allena Katz Pulmonologist: Dr. Lenice Llamas GI: Dr. Gerrit Heck  PPM/ICD - Denies  Chest x-ray - N/A EKG - 11/05/21 Stress Test - 06/24/19 ECHO - 06/23/19 Cardiac Cath - 05/19/21  Sleep Study - OSA CPAP - No  Diabetes: Denies  Blood Thinner Instructions: Stop 5 days prior to procedure. Last dose should be on 01/27/22. Aspirin Instructions: N/A  ERAS Protcol - Yes PRE-SURGERY Ensure or G2- No  COVID TEST- N/A   Anesthesia review: Yes, cardiac hx  Patient denies shortness of breath, fever, cough and chest pain at PAT appointment   All instructions explained to the patient, with a verbal understanding of the material. Patient agrees to go over the instructions while at home for a better understanding. Patient also instructed to self quarantine after being tested for COVID-19. The opportunity to ask questions was provided.

## 2022-01-20 ENCOUNTER — Ambulatory Visit: Payer: Medicare PPO | Admitting: Internal Medicine

## 2022-01-20 ENCOUNTER — Encounter: Payer: Self-pay | Admitting: Internal Medicine

## 2022-01-20 ENCOUNTER — Other Ambulatory Visit: Payer: Self-pay

## 2022-01-20 ENCOUNTER — Other Ambulatory Visit (HOSPITAL_COMMUNITY): Payer: Self-pay

## 2022-01-20 VITALS — BP 122/64 | HR 51 | Temp 95.4°F | Resp 16

## 2022-01-20 DIAGNOSIS — D839 Common variable immunodeficiency, unspecified: Secondary | ICD-10-CM | POA: Diagnosis not present

## 2022-01-20 DIAGNOSIS — J4540 Moderate persistent asthma, uncomplicated: Secondary | ICD-10-CM

## 2022-01-20 DIAGNOSIS — M150 Primary generalized (osteo)arthritis: Secondary | ICD-10-CM | POA: Diagnosis not present

## 2022-01-20 DIAGNOSIS — J3089 Other allergic rhinitis: Secondary | ICD-10-CM

## 2022-01-20 DIAGNOSIS — R0609 Other forms of dyspnea: Secondary | ICD-10-CM

## 2022-01-20 DIAGNOSIS — G894 Chronic pain syndrome: Secondary | ICD-10-CM | POA: Diagnosis not present

## 2022-01-20 DIAGNOSIS — Z79891 Long term (current) use of opiate analgesic: Secondary | ICD-10-CM | POA: Diagnosis not present

## 2022-01-20 DIAGNOSIS — M47816 Spondylosis without myelopathy or radiculopathy, lumbar region: Secondary | ICD-10-CM | POA: Diagnosis not present

## 2022-01-20 DIAGNOSIS — J069 Acute upper respiratory infection, unspecified: Secondary | ICD-10-CM | POA: Diagnosis not present

## 2022-01-20 MED ORDER — AZELASTINE HCL 0.1 % NA SOLN
2.0000 | Freq: Two times a day (BID) | NASAL | 5 refills | Status: DC
  Filled 2022-01-20: qty 30, 25d supply, fill #0

## 2022-01-20 MED ORDER — BELBUCA 750 MCG BU FILM
1.0000 | ORAL_FILM | Freq: Two times a day (BID) | BUCCAL | 2 refills | Status: DC
  Filled 2022-01-20: qty 60, 30d supply, fill #0
  Filled 2022-02-18: qty 60, 30d supply, fill #1

## 2022-01-20 MED ORDER — OXYCODONE-ACETAMINOPHEN 7.5-325 MG PO TABS
1.0000 | ORAL_TABLET | ORAL | 0 refills | Status: DC | PRN
  Filled 2022-01-20: qty 180, 30d supply, fill #0

## 2022-01-20 MED ORDER — IPRATROPIUM BROMIDE 0.03 % NA SOLN
1.0000 | Freq: Three times a day (TID) | NASAL | 5 refills | Status: DC | PRN
  Filled 2022-01-20: qty 30, 57d supply, fill #0

## 2022-01-20 MED ORDER — TRIAMCINOLONE ACETONIDE 55 MCG/ACT NA AERO
2.0000 | INHALATION_SPRAY | Freq: Every day | NASAL | 5 refills | Status: DC
  Filled 2022-01-20: qty 16.9, 30d supply, fill #0

## 2022-01-20 MED ORDER — MONTELUKAST SODIUM 10 MG PO TABS
10.0000 mg | ORAL_TABLET | Freq: Every day | ORAL | 5 refills | Status: DC
  Filled 2022-01-20 – 2022-02-06 (×3): qty 30, 30d supply, fill #0

## 2022-01-20 NOTE — Progress Notes (Signed)
Anesthesia Chart Review:  Case: [id number] Date/Time: 02/02/22 0715   Procedures:      OBLIQUE LUMBAR INTERBODY FUSION 2 LEVEL L3-5 - 4 hrs Dr. Carlis Abbott to do approach Left tap block with exparel 3 C-Bed     ABDOMINAL EXPOSURE   Anesthesia type: General   Pre-op diagnosis: Degenerative scoliosis with spinal stenosis and degenerative spondylothesis   Location: [place] OR ROOM 04 / [place] [facility]   Surgeons: Melina Schools, MD; Marty Heck, MD       DISCUSSION: Patient is a 76 year old female scheduled for the above procedure. She is also scheduled for L3-5 PLIF on 02/03/22 by Dr. Rolena Infante.  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, completed 12 months Bactrim 1/22022), hypothyroidism, GERD, HLD, IBS, lymphocytic colitis (on budesonide), skin cancer (BCC, SCC), amaurosis fugax (right eye ~ 12/207-02/2016, unremarkable work-up including MRI, carotid US, echo, event monitor; evaluated by neuro-ophthalmologist Jolyn Nap, MD and neurologist Aviva Kluver, MD with [facility] for possible amaurosis fugax versus retinal migraine, ASA & statin recommended), OSA (uses oral appliance), nasal fracture (s/p closed reduction 08/29/18), chronic pain, GI bleed (10/2021, reflux esophagitis & nonbleeding gastric ulcer on EGD in setting of Celebrex, s/p PRBC & high dose PPI, ticagrelor changed to clopidogrel, off ASA).  Preoperative cardiology input outlined by Diona Browner, NP on 12/18/21:"Given past medical history and time since last visit, based on ACC/AHA guidelines, Beverly [name] is at acceptable risk for the planned procedure without further cardiovascular testing. Patient was previously cleared for procedure by Dr. Claiborne Billings [11/06/21] and Dr. Johnsie Cancel.     Per Dr. Claiborne Billings and Dr. Johnsie Cancel, patient may hold Plavix for 5 days prior to surgery. Please resume Plavix as soon as possible postprocedure, at the discretion of the surgeon." She reported last Plavix scheduled for 01/27/22. - (She had initial preoperative evaluation with Santiago Glad, PA-C on 10/21/21 who wrote, "Preop clearance for L5-6 back surgery by Dr. Rutherford Guys. Patient was scheduled last April but was canceled with stent placement.  Patient will complete cardaic rehab tomorrow and has done well without cardiac symptoms. Discussed with Dr. Johnsie Cancel who says the patient can her Brilinta/ASA for surgery in October 6 months from her PCI... According to the Revised Cardiac Risk Index (RCRI), her Perioperative Risk of Major Cardiac Event is (%): 0.9.Marland KitchenMarland Kitchen Her Functional Capacity in METs is: 5.62 according to the Duke Activity Status Index (DASI)." Surgery had been delayed again due to GI bleed in late September.)   Last pulmonology visit with Dr. Shearon Stalls was on 01/05/22 for acute on chronic cough with yellow sputum X 7-10 days. Taking Mucinex. She has known bronchiectasis and hypoagammaglobulinemia with chronic rhinitis.  She is on IVIG infusions.  She is 1 year post 60-monththerapy for nocardia pneumonia.  She has laryngeal pharyngeal reflux and recently treated for flux esophagitis and nonbleeding gastric ulcer 10/2021. Also with vasomotor rhinitis, on ipratropium. She denied fever/chills. Dr. DShearon Stallsthought cough likely related to post nasal drainage and reflux. Advised continued ipratropium nasal spray and add saline sprays if needed. Continue PPI and/or H2 blocker per GI. Claritin to be changed to levocetirizine. Continue as needed albuterol. Follow-up in 4 months, sooner if worsening symptoms. .Marland Kitchen  PCP follow-up with Dr. HIsaac Blisson 01/07/22 for annual preventive exam. She is aware of surgery plans.  Lung sounds noted to  be clear without wheezing or crackles.  01/07/22 CBC did not have  a platelet count due to significaint clumping. PLT count from 08/20/21-12/18/21 ranged from 123-179K, and last was 179K on 12/18/21. CBC was < 30 days and PLT count is from 12/18/21--no significant thrombocytopenia with labs over the past year (lowest 123K), so I did not order a repeat CBC; however, I will no notify Dr. Valla Leaver scheduler and defer to him if he wants CBC repeated prior to surgery.   Above reviewed with anesthesiologist Renold Don, MD. anesthesia team to evaluate on the day of surgery.   ADDENDUM 01/22/22 6:20 PM: I spoke on the phone with Beverly [name] earlier today because since my note was initiated, she had routine cardiology evaluation with Ermalinda Barrios, PA-C on 01/21/22 and also an acute visit at [facility] with Sherre Poot, MD. She remained stable from a cardiac standpoint.  Her allergy visit though was for sinus congestion and dyspnea. Questioned if possible viral URI. Lungs were clear. If no improvement by the end of the week then could consider antibiotics. Since then Beverly [name] says she is going well. Dr. Neldon Mc is her usual immunologist, and she had never met Dr. Posey Pronto prior. She said that her on-going respiratory symptoms (related to bronchiectasis, prior Nocardia PNA, tracheobronchomalacia),are somewhat chronic/waxing and waning, so she is very vigilant to be evaluated if even a mild change. She takes Mucinex on a daily basis to help with clearing secretions.  She is also on Xyzal, Entocort EC, and as needed albuterol. She does not wheeze, and she does not get dyspnea with her usual daily activities. She is a former ID Therapist, sports and given her immunodeficiency she is diligent in wearing a face mask and staying up to date with her vaccines. She gives herself Xembify (immune globulin) injections weekly on Wednesdays.  She actually sounded great on the phone. No conversational dyspnea, congestion, coughing, or hoarseness noted. She really feels back to her baseline. She  is scheduled to see Dr. Rolena Infante next week. Discussed that should she develop any acute changes then she should notify Dr. Rolena Infante and the appropriate specialist as we want her at her baseline for surgery and any acute changes could result in need for case postponement. She verbalized understanding. I updated anesthesiologist Stoltzfus, Juliane Poot and Judeen Hammans at Dr. Rolena Infante office.     Of note, she is on buprenorphine (Belbuca) 750 mcg BID which is prescribed by Pain Management (Dr. Hardin Negus). She has been on the increased dose for the past year. Her pain has been worse since she had to stop Celebrex after her recent GI bleed. She indicated that Dr. Hardin Negus had not had her hold Belbuca for surgeries in the past. She does have Norco as needed, but says he is prescribing Percocet post-operatively.    VS: BP 129/65   Pulse (!) 54   Temp 36.7 C (Oral)   Resp 16   Ht _0  (1.6 m)   Wt 67 kg   SpO2 96%   BMI 26.18 kg/m    PROVIDERS: Isaac Bliss, Rayford Halsted, MD is PCP  [name], MD is cardiologist Lenice Llamas, MD is pulmonologist Allena Katz, MD is immunologist/allergist Verner Mould, DO is GI Alric Ran, MD is neurologist. Last visit 09/16/21 for right sided hyperhidrosis felt related to residual right sided Bell's palsy (diagnosed 1970's). She had also reported 3 episodes of amaurosis fugax in the past with previous negative work-up.  CTA head/neck ordered which showed no  significant stenosis or acute findings. Rosiland Oz, MD is ID. Last visit 02/04/21 for nocardia pneumonia follow-up. Vernelle Emerald, MD is rheumatologist Dillingham, Lyndee Leo, DO is plastic surgeon. S/p excision of left ear lesion 08/21/21 and brow lift 10/10/18. Lavonna Monarch, MD is dermatologist Nicholaus Bloom, MD is pain management provider   LABS: Most recent lab results in [facility] include: Lab Results  Component Value Date   WBC 5.7 01/07/2022   HGB 12.1 01/07/2022   HCT 36.4 01/07/2022   PLT  CANCELED 01/07/2022   GLUCOSE 93 01/19/2022   CHOL 147 01/07/2022   TRIG 208 (H) 01/07/2022   HDL 45 (L) 01/07/2022   LDLCALC 72 01/07/2022   ALT 29 11/20/2020   AST 34 11/20/2020   NA 139 01/19/2022   K 4.5 01/19/2022   CL 105 01/19/2022   CREATININE 0.71 01/19/2022   BUN 13 01/19/2022   CO2 31 01/19/2022   TSH 1.33 08/12/2021   HGBA1C 4.9 11/05/2021     OTHER: EGD 01/05/22: IMPRESSION: - Normal esophagus. - Gastritis. Biopsied. [Predominantly oxyntic type mucosa with no significant pathology, no H. pylori organisms identified] - Normal incisura, antrum and pylorus. - Normal duodenal bulb, first portion of the duodenum and second portion of the duodenum. - Duodenal diverticulum.  Spirometry 10/03/20: Normal ventilatory function.  Sleep study June 2021 IMPRESSIONS - No significant obstructive sleep apnea occurred during this study (AHI = 1.9/h). - No significant central sleep apnea occurred during this study (CAI = 0.7/h). - The patient had minimal or no oxygen desaturation during the study (Min O2 = 89.0%) - The patient snored with soft snoring volume.    IMAGES: CT Abd/pelvis 11/03/21: IMPRESSION: 1. Mild distal colonic wall thickening compatible with colitis. 2.  Aortic Atherosclerosis (ICD10-I70.0).   CTA Head/Neck 10/16/21: IMPRESSION: No acute intracranial abnormality. Mild plaque without hemodynamically significant stenosis.  CXR 08/20/21: FINDINGS: Normal heart size post coronary stenting. Mediastinal contours and pulmonary vascularity normal. Lungs clear. No infiltrate, pleural effusion, or pneumothorax. Biconvex thoracic scoliosis and osseous demineralization with note of a RIGHT shoulder prosthesis. IMPRESSION: No acute abnormalities.    EKG: EKG 11/05/21:  Sinus bradycardia at 58 bpm First degree A-V block Incomplete right bundle branch block Possible Anterior infarct , age undetermined Abnormal ECG When compared with ECG of 20-Aug-2021  09:52, PREVIOUS ECG IS PRESENT Confirmed by Loralie Champagne [phone]) on 11/06/2021 11:37:36 PM -Bigeminy PACs resolved when compared to 08/20/2021 tracing.     CV: Cardiac cath/PCI 05/19/21: Conclusions: Severe two-vessel coronary artery disease with up to 90% stenoses involving the ostial through mid LAD and ostial through proximal ramus intermedius.  Mild, nonobstructive CAD noted in the LCx and RCA. Normal left ventricular systolic function (LVEF 79-02%) with normal filling pressure (LVEDP 10 mmHg) Successful IVUS-guided PCI to ostial through mid LAD using Onyx Frontier 2.75 x 38 mm drug-eluting stent with 0% residual stenosis and TIMI-3 flow Successful PCI to ostial through proximal ramus intermedius using Onyx Frontier 2.0 x 22 mm drug-eluting stent with 0% residual stenosis and TIMI-3 flow.   Recommendations: Overnight extended recovery. Dual antiplatelet therapy with aspirin and clopidogrel, ideally for 6 months.  Nonemergent surgery will likely need to be delayed to allow for adequate antiplatelet therapy following two-vessel PCI. Aggressive secondary prevention.     Echo 06/23/19: IMPRESSIONS   1. Left ventricular ejection fraction, by estimation, is 55 to 60%. The  left ventricle has normal function. The left ventricle has no regional  wall motion abnormalities. There is mild left ventricular hypertrophy.  Left ventricular diastolic function  could not be evaluated.   2. Right ventricular systolic function is normal. The right ventricular  size is normal.   3. The mitral valve is grossly normal. Trivial mitral valve  regurgitation. No evidence of mitral stenosis.   4. The aortic valve has an indeterminant number of cusps. Aortic valve  regurgitation is not visualized. Mild aortic valve sclerosis is present,  with no evidence of aortic valve stenosis.    Cardiac event monitor 09/12/15-09/15/15:  Patient had minimum heart rate of 41 bpm, maximum heart rate of 158 bpm, and average  heart rate of 59 bpm.  Predominant underlying rhythm was sinus rhythm.  First-degree AV block was present.  6 supraventricular tachycardia runs occurred.  The run with the fastest interval lasting 5 beats with a max rate of 158 bpm, the longest lasting 16.2 seconds with an average rate of 96 bpm.  Isolated SVE's were rare (< 1.0%), SVE couplets were rare (< 1.0%), and SVE triplets were rare (< 1.0%).  Isolated VE's were rare (< 1.0%), VE couplets were rare (< 1.0%), and no VE triplets were present.      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   right sholder   BCC (basal cell carcinoma of skin) 05/09/2014   mid lower back   BCC (basal cell carcinoma of skin) 05/03/2017   right low back   BCC (basal cell carcinoma of skin) 05/03/2017   left upper back   BCC (basal cell carcinoma of skin) 07/05/2018   left mid back   BCC (basal cell carcinoma of skin) 05/20/1992   upper back   BCC (basal cell carcinoma of skin) 07/29/1993   left sholder medial   BCC (basal cell carcinoma of skin) 07/29/1993   left sholder lateral   BCC (basal cell carcinoma of skin) 07/29/1993   right thigh   BCC (basal cell carcinoma of skin) 07/29/1993   right sholder   BCC (basal cell carcinoma of skin) 12/22/1994   right mid forearm   BCC (basal cell carcinoma of skin) 12/22/1994   right upper forearm   BCC (basal cell carcinoma of skin) 12/22/1994   lower right upper arm   BCC (basal cell carcinoma of skin) 12/22/1994   right upper arm sholder   BCC (basal cell carcinoma of skin) 08/11/1995   left leg below knee   BCC (basal cell carcinoma of skin) 04/11/2002   mid back   BCC (basal cell carcinoma of skin) 12/10/2002   right center upper back   BCC (basal  cell carcinoma of skin) 05/26/2005   right post sholder   BCC (basal cell carcinoma) 05/09/2014   left inner shin   BCC (basal cell carcinoma) 06/12/2014   left forearm   Bowen's disease 10/07/1994   right post knee, right inner forearm/wrist   Bowen's disease 08/11/1995   left sholder   Bronchiectasis ([place])    Cataract    left   Chronic pain    Common variable immunodeficiency [facility])    sees dr Gayleen Orem 02-11-2021 epic   Coronary artery disease    Depression    hx of   Diverticulosis of colon 1998  mild   DJD (degenerative joint disease)    Duodenal ulcer    h/o yrs ago   GERD (gastroesophageal reflux disease)    History of SCC (squamous cell carcinoma) of skin    Dr. Denna Haggard   History of sinus bradycardia    HLD (hyperlipidemia)    hypertriglyceridemia   Hypertensive retinopathy of both eyes 11/18/2017   Hypothyroidism    IBS (irritable bowel syndrome)    Internal hemorrhoids 1998   Lymphocytic colitis    Mitral regurgitation    mild   Nocardiosis    relased by infection disease dec 2022   Osteoarthritis    feet,shoulder,neck,back,hips and hands.   Pneumonia 2015   PONV (postoperative nausea and vomiting)    Rotator cuff tear, right 02/2019   infraspinatus and supraspinatus, and dislocation of long head of bicep tendon (Dr. Alvan Dame)   SCC (squamous cell carcinoma) 11/26/2014   left hand, right hand, right deltoid mnay areas   SCC (squamous cell carcinoma) 05/03/2017   left cheek   Sleep apnea    uses a mouth guard nightly   Squamous cell carcinoma in situ (SCCIS) 07/05/2018   left hand   Tracheobronchomalacia    Vitamin D deficiency    mild    Past Surgical History:  Procedure Laterality Date   ABDOMINAL HYSTERECTOMY  1998   complete   BIOPSY  11/05/2021   Procedure: BIOPSY;  Surgeon: Thornton Park, MD;  Location: [facility]COPY;  Service: Gastroenterology;;   BLEPHAROPLASTY Bilateral 01/2018   BROW LIFT Bilateral 10/10/2018   Procedure: Delphina Cahill LIFT;   Surgeon: Wallace Going, DO;  Location: [place];  Service: Plastics;  Laterality: Bilateral;   BUNIONECTOMY     R 12/08, L 2004 (Dr. Janus Molder)   [place]   bilateral   CARPAL TUNNEL RELEASE Left 03/06/2021   Procedure: Left Revision Carpal Tunnel Release with hypothenar fat pad flap;  Surgeon: [name], MD;  Location: [place];  Service: Orthopedics;  Laterality: Left;  with local anesthesia   CATARACT EXTRACTION Bilateral Left in 08/2012, Right 04/2016   Dr.Hecker   CESAREAN SECTION     1981 and [place]   laparoscopic   CLOSED REDUCTION NASAL FRACTURE N/A 08/29/2018   Procedure: CLOSED REDUCTION NASAL FRACTURE;  Surgeon: Wallace Going, DO;  Location: [place];  Service: Plastics;  Laterality: N/A;  1 hour, please   COLONOSCOPY  2006   COLONOSCOPY  01/2012   due again 01/2021; mild diverticulosis   CORONARY STENT INTERVENTION N/A 05/19/2021   Procedure: CORONARY STENT INTERVENTION;  Surgeon: Nelva Bush, MD;  Location: [place] CV LAB;  Service: Cardiovascular;  Laterality: N/A;   epidural steroid injection, back  02/2010   ESOPHAGOGASTRODUODENOSCOPY (EGD) WITH PROPOFOL N/A 11/05/2021   Procedure: ESOPHAGOGASTRODUODENOSCOPY (EGD) WITH PROPOFOL;  Surgeon: Thornton Park, MD;  Location: [place];  Service: Gastroenterology;  Laterality: N/A;   HIP SURGERY     right bursectomy x 3   HIP SURGERY Right 2000   torn cartilage, repaired   INGUINAL HERNIA REPAIR  10/2007   bilat   INTRAVASCULAR ULTRASOUND/IVUS N/A 05/19/2021   Procedure: Intravascular Ultrasound/IVUS;  Surgeon: Nelva Bush, MD;  Location: [place] CV LAB;  Service: Cardiovascular;  Laterality: N/A;   LEFT HEART CATH AND CORONARY ANGIOGRAPHY N/A 05/19/2021   Procedure: LEFT HEART CATH AND CORONARY ANGIOGRAPHY;  Surgeon: Nelva Bush, MD;  Location: [facility]  INVASIVE CV  LAB;  Service: Cardiovascular;  Laterality: N/A;   NECK SURGERY  1989   c6-7 cervical laminectomy and diskecotmy   REVERSE SHOULDER ARTHROPLASTY Right 04/11/2019   Procedure: REVERSE SHOULDER ARTHROPLASTY;  Surgeon: Nicholes Stairs, MD;  Location: [place];  Service: Orthopedics;  Laterality: Right;  Regional Block   SHOULDER SURGERY Left 03/2003   left rotator cuff repair   SHOULDER SURGERY Left 03/14/2018   rotator cuff repair; Dr. Tonita Cong   TONSILLECTOMY  age 7   TROCHANTERIC BURSA EXCISION Right [place]   UPPER GASTROINTESTINAL ENDOSCOPY     WISDOM TOOTH EXTRACTION     as teenager    MEDICATIONS:  acetaminophen (TYLENOL) 500 MG tablet   acetaminophen (TYLENOL) 650 MG CR tablet   albuterol (VENTOLIN HFA) 108 (90 Base) MCG/ACT inhaler   amoxicillin (AMOXIL) 500 MG tablet   ARTIFICIAL TEAR OP   atorvastatin (LIPITOR) 40 MG tablet   budesonide (ENTOCORT EC) 3 MG 24 hr capsule   Buprenorphine HCl (BELBUCA) 750 MCG FILM   Buprenorphine HCl (BELBUCA) 750 MCG FILM   Calcium Carbonate Antacid (TUMS PO)   Calcium Carbonate-Vitamin D 600-200 MG-UNIT TABS   clopidogrel (PLAVIX) 75 MG tablet   colestipol (COLESTID) 1 g tablet   Cyanocobalamin (VITAMIN B-12 PO)   diclofenac Sodium (VOLTAREN) 1 % GEL   Docusate Sodium 100 MG capsule   EPINEPHrine 0.3 mg/0.3 mL IJ SOAJ injection   famotidine (PEPCID) 40 MG tablet   FIBER PO   Fluorouracil (TOLAK) 4 % CREA   gabapentin (NEURONTIN) 800 MG tablet   guaiFENesin (MUCINEX) 600 MG 12 hr tablet   HYDROcodone-acetaminophen (NORCO) 7.5-325 MG tablet   ipratropium (ATROVENT) 0.03 % nasal spray   Lactobacillus-Inulin (CULTURELLE ADULT ULT BALANCE) CAPS   levocetirizine (XYZAL) 5 MG tablet   levothyroxine (SYNTHROID) 25 MCG tablet   melatonin 5 MG TABS   methocarbamol (ROBAXIN) 500 MG tablet   montelukast (SINGULAIR) 10 MG tablet   Multiple Vitamins-Minerals (MULTIVITAMIN WITH MINERALS) tablet   nitroGLYCERIN (NITROSTAT)  0.4 MG SL tablet   omega-3 acid ethyl esters (LOVAZA) 1 g capsule   ondansetron (ZOFRAN-ODT) 4 MG disintegrating tablet   oxyCODONE-acetaminophen (PERCOCET) 7.5-325 MG tablet   pantoprazole (PROTONIX) 40 MG tablet   PARoxetine (PAXIL) 30 MG tablet   PARoxetine (PAXIL) 40 MG tablet   triamcinolone (NASACORT) 55 MCG/ACT AERO nasal inhaler   XEMBIFY 10 GM/50ML SOLN   No current facility-administered medications for this encounter.    Myra Gianotti, PA-C Surgical Short Stay/Anesthesiology [facility] Phone [phone] [facility] Phone [phone] 01/20/2022 5:58 PM

## 2022-01-20 NOTE — Progress Notes (Signed)
FOLLOW UP Date of Service/Encounter:  01/20/22   Subjective:  Beverly [name] (DOB: 03-25-1945) is a 76 y.o. female who returns to the [facility] [place] on 01/20/2022 for follow up for an acute visit.   She has a history of CVID with bronchiectasis and history of Nocardia pneumonia treated with subcu immunoglobulin infusions, history of asthma, history of reflux, history of rhinitis history of lymphocytic colitis.    History obtained from: chart review and patient.  Reports doing well with weekly subQ Ig 10g; does have slight redness for a few hours at site of injection but otherwise no reactions.  Last random IgG 07/2021 was 1159.    The last 5 days, she has developed sinus congestion, drainage, wet cough sometimes with thin mucous while other times with plugs and feeling warm and fatigue. No severe body aches.  She has not had any sick contacts.  She had similar occurrences in Aug and Sept 2023.  She is taking Mucinex twice daily and PRN Ipratroprium/Nasacort.  Also on Singulair daily.  She is not doing nasal rinses.    She is also having some shortness of breath with talking but does okay with activity.  It has worsened with this upper respiratory symptoms.  No wheezing or chronic coughing.  She has had prior spirometry that were normal.  Last CT 2021 with ILD and severe TBM.   She is still on Budesonide therapy '3mg'$  daily for lymphocytic colitis. Reports reflux is controlled with Protonix daily and Pepcid q PM.  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   right sholder   BCC (basal cell carcinoma of skin) 05/09/2014   mid lower back   BCC (basal cell carcinoma of skin) 05/03/2017   right low back   BCC  (basal cell carcinoma of skin) 05/03/2017   left upper back   BCC (basal cell carcinoma of skin) 07/05/2018   left mid back   BCC (basal cell carcinoma of skin) 05/20/1992   upper back   BCC (basal cell carcinoma of skin) 07/29/1993   left sholder medial   BCC (basal cell carcinoma of skin) 07/29/1993   left sholder lateral   BCC (basal cell carcinoma of skin) 07/29/1993   right thigh   BCC (basal cell carcinoma of skin) 07/29/1993   right sholder   BCC (basal cell carcinoma of skin) 12/22/1994   right mid forearm   BCC (basal cell carcinoma of skin) 12/22/1994   right upper forearm   BCC (basal cell carcinoma of skin) 12/22/1994   lower right upper arm   BCC (basal cell carcinoma of skin) 12/22/1994   right upper arm sholder   BCC (basal cell carcinoma of skin) 08/11/1995   left leg below knee   BCC (basal cell carcinoma of skin) 04/11/2002   mid back   BCC (basal cell carcinoma of skin) 12/10/2002   right center upper back   BCC (basal cell carcinoma of skin) 05/26/2005   right post sholder   BCC (basal cell carcinoma) 05/09/2014   left inner shin   BCC (basal cell carcinoma) 06/12/2014   left forearm   Bowen's disease 10/07/1994  right post knee, right inner forearm/wrist   Bowen's disease 08/11/1995   left sholder   Bronchiectasis ([place])    Cataract    left   Chronic pain    Common variable immunodeficiency [facility])    sees dr Gayleen Orem 02-11-2021 epic   Coronary artery disease    Depression    hx of   Diverticulosis of colon 1998   mild   DJD (degenerative joint disease)    Duodenal ulcer    h/o yrs ago   GERD (gastroesophageal reflux disease)    History of SCC (squamous cell carcinoma) of skin    Dr. Denna Haggard   History of sinus bradycardia    HLD (hyperlipidemia)    hypertriglyceridemia   Hypertensive retinopathy of both eyes 11/18/2017   Hypothyroidism    IBS (irritable bowel syndrome)    Internal hemorrhoids 1998   Lymphocytic colitis    Mitral  regurgitation    mild   Nocardiosis    relased by infection disease dec 2022   Osteoarthritis    feet,shoulder,neck,back,hips and hands.   Pneumonia 2015   PONV (postoperative nausea and vomiting)    Rotator cuff tear, right 02/2019   infraspinatus and supraspinatus, and dislocation of long head of bicep tendon (Dr. Alvan Dame)   SCC (squamous cell carcinoma) 11/26/2014   left hand, right hand, right deltoid mnay areas   SCC (squamous cell carcinoma) 05/03/2017   left cheek   Sleep apnea    uses a mouth guard nightly   Squamous cell carcinoma in situ (SCCIS) 07/05/2018   left hand   Tracheobronchomalacia    Vitamin D deficiency    mild    Objective:  BP 122/64   Pulse (!) 51   Temp (!) 95.4 F (35.2 C) (Temporal)   Resp 16   SpO2 95%  There is no height or weight on file to calculate BMI. Physical Exam: GEN: alert, well developed HEENT: clear conjunctiva, TM grey and translucent, nose with moderate inferior turbinate hypertrophy, boggy nasal mucosa, clear rhinorrhea, + cobblestoning HEART: regular rate and rhythm, no murmur LUNGS: clear to auscultation bilaterally, no coughing, unlabored respiration SKIN: no rashes or lesions  Spirometry:  Tracings reviewed. Her effort: Good reproducible efforts. FVC: 2.79L FEV1: 2.26L, 100% predicted FEV1/FVC ratio: 81% Interpretation: Spirometry consistent with normal pattern.  Please see scanned spirometry results for details.   Assessment/Plan   Possible Viral URI Dyspnea - If no improvement by Friday, call me and we will prescribe an antibiotic.  This could be a viral infection and might improve but if not, we will treat it as a bacterial infection; she is at high risk for complications due to her CVID.   She is to add nose sprays and rinses as discussed below.  - With her chronic dyspnea, did obtain spirometry to evaluate lung function in setting of CVID which was normal.  She does have known bronchiectasis and is followed by Pulm.   Last CT 06/2019 did show ILD and severe tracheobronchomalacia.    1.  Continue immunoglobulin infusions 10g every week.  2.  Continue to treat reflux / LPR:   A.  Pantoprazole 40 mg in AM-increase to twice daily while on antibiotics   B. Famotidine 40 mg in PM   3.  Treat and prevent inflammation of upper airway:   A.  Montelukast 10 mg -1 tablet 1 time per day  B.  OTC Nasacort -1 spray each nostril 1 time per day. Add saline nasal rinses with Neilmed  Sinus Rinse bottle prior to nose sprays.  Use distilled water with it .    4. If needed:  A. Loratadine 10 mg 1 tablet 1 time per day  B. Ipratropium 0.06% - 2 sprays each nostril every 6 hours (4x/day) C. Azelastine 2 sprays each nostril twice daily as needed.   5. Continue routine CVID labs (last 07/2021- stable).  6.  Continue budesonide therapy for lymphocytic colitis  7.  Keep follow up with Dr. Neldon Mc in January 2024.     No follow-ups on file. Harlon Flor, MD  [facility]

## 2022-01-20 NOTE — Patient Instructions (Addendum)
Viral URI - If no improvement by Friday, call me and we will prescribe an antibiotic.  This could be a viral infection and might improve but if not, we will treat it as a bacterial infection.   1.  Continue immunoglobulin infusions every week.  2.  Continue to treat reflux / LPR:   A.  Pantoprazole 40 mg in AM-increase to twice daily while on antibiotics   B. Famotidine 40 mg in PM   3.  Treat and prevent inflammation of upper airway:   A.  Montelukast 10 mg -1 tablet 1 time per day  B.  OTC Nasacort -1 spray each nostril 1 time per day. Add saline nasal rinses with Neilmed Sinus Rinse bottle prior to nose sprays.  Use distilled water with it .    4. If needed:  A. Loratadine 10 mg 1 tablet 1 time per day  B. Ipratropium 0.06% - 2 sprays each nostril every 6 hours (4x/day) C. Azelastine 2 sprays each nostril twice daily as needed.   5. Continue routine CVID labs (last 07/2021).  6.  Continue budesonide therapy for lymphocytic colitis  7.  Keep follow up with Dr. Neldon Mc in January 2024.

## 2022-01-21 ENCOUNTER — Other Ambulatory Visit (HOSPITAL_COMMUNITY): Payer: Self-pay

## 2022-01-21 ENCOUNTER — Encounter: Payer: Self-pay | Admitting: Physician Assistant

## 2022-01-21 ENCOUNTER — Ambulatory Visit: Payer: Medicare PPO | Attending: Cardiovascular Disease | Admitting: Physician Assistant

## 2022-01-21 VITALS — BP 120/60 | HR 56 | Ht 63.0 in | Wt 150.0 lb

## 2022-01-21 DIAGNOSIS — J849 Interstitial pulmonary disease, unspecified: Secondary | ICD-10-CM | POA: Diagnosis not present

## 2022-01-21 DIAGNOSIS — E785 Hyperlipidemia, unspecified: Secondary | ICD-10-CM

## 2022-01-21 DIAGNOSIS — I471 Supraventricular tachycardia, unspecified: Secondary | ICD-10-CM | POA: Diagnosis not present

## 2022-01-21 DIAGNOSIS — K250 Acute gastric ulcer with hemorrhage: Secondary | ICD-10-CM

## 2022-01-21 DIAGNOSIS — J398 Other specified diseases of upper respiratory tract: Secondary | ICD-10-CM | POA: Diagnosis not present

## 2022-01-21 DIAGNOSIS — I2510 Atherosclerotic heart disease of native coronary artery without angina pectoris: Secondary | ICD-10-CM | POA: Diagnosis not present

## 2022-01-21 DIAGNOSIS — G4733 Obstructive sleep apnea (adult) (pediatric): Secondary | ICD-10-CM

## 2022-01-21 MED ORDER — CLOPIDOGREL BISULFATE 75 MG PO TABS
75.0000 mg | ORAL_TABLET | Freq: Every day | ORAL | 3 refills | Status: DC
  Filled 2022-01-21 – 2022-02-05 (×2): qty 90, 90d supply, fill #0
  Filled 2022-06-04: qty 90, 90d supply, fill #1
  Filled 2022-08-14 – 2022-08-25 (×2): qty 90, 90d supply, fill #2
  Filled 2022-12-03: qty 90, 90d supply, fill #3

## 2022-01-21 NOTE — Patient Instructions (Signed)
Medication Instructions:  Your physician recommends that you continue on your current medications as directed. Please refer to the Current Medication list given to you today.  *If you need a refill on your cardiac medications before your next appointment, please call your pharmacy*  Follow-Up: At [facility], you and your health needs are our priority.  As part of our continuing mission to provide you with exceptional heart care, we have created designated Provider Care Teams.  These Care Teams include your primary Cardiologist (physician) and Advanced Practice Providers (APPs -  Physician Assistants and Nurse Practitioners) who all work together to provide you with the care you need, when you need it.  Your next appointment:   6 month(s)  The format for your next appointment:   In Person  Provider:   [name], MD     Important Information About Sugar

## 2022-01-22 ENCOUNTER — Other Ambulatory Visit: Payer: Self-pay

## 2022-01-22 NOTE — Anesthesia Preprocedure Evaluation (Addendum)
Anesthesia Evaluation  Patient identified by MRN, date of birth, ID band Patient awake    Reviewed: Allergy & Precautions, H&P , NPO status , Patient's Chart, lab work & pertinent test results  History of Anesthesia Complications (+) PONV and history of anesthetic complications  Airway Mallampati: II  TM Distance: >3 FB Neck ROM: Full    Dental no notable dental hx. (+) Teeth Intact, Dental Advisory Given   Pulmonary asthma , sleep apnea    Pulmonary exam normal breath sounds clear to auscultation       Cardiovascular Exercise Tolerance: Good + CAD and + Cardiac Stents   Rhythm:Regular Rate:Normal     Neuro/Psych    Depression    negative neurological ROS     GI/Hepatic Neg liver ROS, PUD,GERD  ,,  Endo/Other  Hypothyroidism    Renal/GU negative Renal ROS  negative genitourinary   Musculoskeletal  (+) Arthritis , Osteoarthritis,    Abdominal   Peds  Hematology  (+) Blood dyscrasia, anemia   Anesthesia Other Findings   Reproductive/Obstetrics negative OB ROS                             Anesthesia Physical Anesthesia Plan  ASA: 3  Anesthesia Plan: General   Post-op Pain Management: Regional block*, Ofirmev IV (intra-op)* and Ketamine IV*   Induction: Intravenous  PONV Risk Score and Plan: 4 or greater and Ondansetron, Dexamethasone and Midazolam  Airway Management Planned: Oral ETT  Additional Equipment: Arterial line  Intra-op Plan:   Post-operative Plan: Extubation in OR  Informed Consent: I have reviewed the patients History and Physical, chart, labs and discussed the procedure including the risks, benefits and alternatives for the proposed anesthesia with the patient or authorized representative who has indicated his/her understanding and acceptance.     Dental advisory given  Plan Discussed with: CRNA  Anesthesia Plan Comments: (PAT note written by Shonna Chock, PA-C.  )       Anesthesia Quick Evaluation

## 2022-01-23 ENCOUNTER — Encounter: Payer: Self-pay | Admitting: Gastroenterology

## 2022-01-27 ENCOUNTER — Other Ambulatory Visit (HOSPITAL_COMMUNITY): Payer: Self-pay

## 2022-01-27 DIAGNOSIS — M545 Low back pain, unspecified: Secondary | ICD-10-CM | POA: Diagnosis not present

## 2022-01-28 ENCOUNTER — Other Ambulatory Visit (HOSPITAL_COMMUNITY): Payer: Self-pay

## 2022-01-29 ENCOUNTER — Other Ambulatory Visit: Payer: Self-pay

## 2022-01-29 ENCOUNTER — Other Ambulatory Visit (HOSPITAL_COMMUNITY): Payer: Self-pay

## 2022-01-29 DIAGNOSIS — L570 Actinic keratosis: Secondary | ICD-10-CM | POA: Diagnosis not present

## 2022-01-29 DIAGNOSIS — Z08 Encounter for follow-up examination after completed treatment for malignant neoplasm: Secondary | ICD-10-CM | POA: Diagnosis not present

## 2022-01-29 DIAGNOSIS — D1801 Hemangioma of skin and subcutaneous tissue: Secondary | ICD-10-CM | POA: Diagnosis not present

## 2022-01-29 DIAGNOSIS — Z85828 Personal history of other malignant neoplasm of skin: Secondary | ICD-10-CM | POA: Diagnosis not present

## 2022-01-29 DIAGNOSIS — L821 Other seborrheic keratosis: Secondary | ICD-10-CM | POA: Diagnosis not present

## 2022-01-29 DIAGNOSIS — L814 Other melanin hyperpigmentation: Secondary | ICD-10-CM | POA: Diagnosis not present

## 2022-02-02 ENCOUNTER — Encounter (HOSPITAL_COMMUNITY): Payer: Self-pay | Admitting: Orthopedic Surgery

## 2022-02-02 ENCOUNTER — Inpatient Hospital Stay (HOSPITAL_COMMUNITY): Payer: Medicare PPO

## 2022-02-02 ENCOUNTER — Other Ambulatory Visit: Payer: Self-pay

## 2022-02-02 ENCOUNTER — Inpatient Hospital Stay (HOSPITAL_COMMUNITY): Payer: Medicare PPO | Admitting: Vascular Surgery

## 2022-02-02 ENCOUNTER — Ambulatory Visit: Payer: Medicare PPO | Admitting: Cardiovascular Disease

## 2022-02-02 ENCOUNTER — Inpatient Hospital Stay (HOSPITAL_COMMUNITY): Payer: Medicare PPO | Admitting: Anesthesiology

## 2022-02-02 ENCOUNTER — Encounter (HOSPITAL_COMMUNITY)
Admission: RE | Disposition: A | Discharge status: Home Health | Payer: Self-pay | Source: Home / Self Care | Attending: Orthopedic Surgery

## 2022-02-02 ENCOUNTER — Inpatient Hospital Stay (HOSPITAL_COMMUNITY)
Admission: RE | Admit: 2022-02-02 | Discharge: 2022-02-04 | DRG: 455 | Disposition: A | Discharge status: Home Health | Payer: Medicare PPO | Attending: Orthopedic Surgery | Admitting: Orthopedic Surgery

## 2022-02-02 DIAGNOSIS — M48061 Spinal stenosis, lumbar region without neurogenic claudication: Principal | ICD-10-CM | POA: Diagnosis present

## 2022-02-02 DIAGNOSIS — G8918 Other acute postprocedural pain: Secondary | ICD-10-CM | POA: Diagnosis not present

## 2022-02-02 DIAGNOSIS — Z981 Arthrodesis status: Secondary | ICD-10-CM | POA: Diagnosis not present

## 2022-02-02 DIAGNOSIS — G4730 Sleep apnea, unspecified: Secondary | ICD-10-CM | POA: Diagnosis not present

## 2022-02-02 DIAGNOSIS — J45909 Unspecified asthma, uncomplicated: Secondary | ICD-10-CM | POA: Diagnosis not present

## 2022-02-02 DIAGNOSIS — E039 Hypothyroidism, unspecified: Secondary | ICD-10-CM | POA: Diagnosis present

## 2022-02-02 DIAGNOSIS — G8929 Other chronic pain: Secondary | ICD-10-CM | POA: Diagnosis present

## 2022-02-02 DIAGNOSIS — Z9842 Cataract extraction status, left eye: Secondary | ICD-10-CM | POA: Diagnosis not present

## 2022-02-02 DIAGNOSIS — K589 Irritable bowel syndrome without diarrhea: Secondary | ICD-10-CM | POA: Diagnosis present

## 2022-02-02 DIAGNOSIS — K219 Gastro-esophageal reflux disease without esophagitis: Secondary | ICD-10-CM | POA: Diagnosis present

## 2022-02-02 DIAGNOSIS — M4156 Other secondary scoliosis, lumbar region: Secondary | ICD-10-CM | POA: Diagnosis not present

## 2022-02-02 DIAGNOSIS — D0462 Carcinoma in situ of skin of left upper limb, including shoulder: Secondary | ICD-10-CM | POA: Diagnosis present

## 2022-02-02 DIAGNOSIS — Z90710 Acquired absence of both cervix and uterus: Secondary | ICD-10-CM

## 2022-02-02 DIAGNOSIS — M5116 Intervertebral disc disorders with radiculopathy, lumbar region: Secondary | ICD-10-CM | POA: Diagnosis not present

## 2022-02-02 DIAGNOSIS — Z91048 Other nonmedicinal substance allergy status: Secondary | ICD-10-CM | POA: Diagnosis not present

## 2022-02-02 DIAGNOSIS — Z8249 Family history of ischemic heart disease and other diseases of the circulatory system: Secondary | ICD-10-CM | POA: Diagnosis not present

## 2022-02-02 DIAGNOSIS — M4316 Spondylolisthesis, lumbar region: Secondary | ICD-10-CM | POA: Diagnosis present

## 2022-02-02 DIAGNOSIS — Z01818 Encounter for other preprocedural examination: Secondary | ICD-10-CM

## 2022-02-02 DIAGNOSIS — D638 Anemia in other chronic diseases classified elsewhere: Secondary | ICD-10-CM

## 2022-02-02 DIAGNOSIS — Z9841 Cataract extraction status, right eye: Secondary | ICD-10-CM

## 2022-02-02 DIAGNOSIS — I739 Peripheral vascular disease, unspecified: Secondary | ICD-10-CM | POA: Diagnosis present

## 2022-02-02 DIAGNOSIS — M5416 Radiculopathy, lumbar region: Secondary | ICD-10-CM | POA: Diagnosis not present

## 2022-02-02 DIAGNOSIS — E781 Pure hyperglyceridemia: Secondary | ICD-10-CM | POA: Diagnosis present

## 2022-02-02 DIAGNOSIS — M4306 Spondylolysis, lumbar region: Secondary | ICD-10-CM | POA: Diagnosis not present

## 2022-02-02 DIAGNOSIS — Z9049 Acquired absence of other specified parts of digestive tract: Secondary | ICD-10-CM

## 2022-02-02 DIAGNOSIS — E559 Vitamin D deficiency, unspecified: Secondary | ICD-10-CM | POA: Diagnosis not present

## 2022-02-02 DIAGNOSIS — Z885 Allergy status to narcotic agent status: Secondary | ICD-10-CM

## 2022-02-02 DIAGNOSIS — M4186 Other forms of scoliosis, lumbar region: Secondary | ICD-10-CM | POA: Diagnosis not present

## 2022-02-02 DIAGNOSIS — Z85828 Personal history of other malignant neoplasm of skin: Secondary | ICD-10-CM

## 2022-02-02 DIAGNOSIS — Z79899 Other long term (current) drug therapy: Secondary | ICD-10-CM | POA: Diagnosis not present

## 2022-02-02 DIAGNOSIS — Z96611 Presence of right artificial shoulder joint: Secondary | ICD-10-CM | POA: Diagnosis present

## 2022-02-02 HISTORY — PX: ABDOMINAL EXPOSURE: SHX5708

## 2022-02-02 LAB — TYPE AND SCREEN
ABO/RH(D): O POS
Antibody Screen: NEGATIVE

## 2022-02-02 SURGERY — OBLIQUE LUMBAR INTERBODY FUSION 2 LEVEL
Anesthesia: General | Site: Spine Lumbar

## 2022-02-02 MED ORDER — ROCURONIUM BROMIDE 10 MG/ML (PF) SYRINGE
PREFILLED_SYRINGE | INTRAVENOUS | Status: AC
  Filled 2022-02-02: qty 10

## 2022-02-02 MED ORDER — ONDANSETRON HCL 4 MG/2ML IJ SOLN
INTRAMUSCULAR | Status: DC | PRN
  Administered 2022-02-02: 4 mg via INTRAVENOUS

## 2022-02-02 MED ORDER — CHLORHEXIDINE GLUCONATE CLOTH 2 % EX PADS: 6.0000 | MEDICATED_PAD | Freq: Once | CUTANEOUS | Status: DC

## 2022-02-02 MED ORDER — ONDANSETRON HCL 4 MG/2ML IJ SOLN: 4.0000 mg | Freq: Four times a day (QID) | INTRAMUSCULAR | Status: DC | PRN

## 2022-02-02 MED ORDER — HYDROMORPHONE HCL 1 MG/ML IJ SOLN
INTRAMUSCULAR | Status: AC
  Filled 2022-02-02: qty 1

## 2022-02-02 MED ORDER — CEFAZOLIN SODIUM-DEXTROSE 2-4 GM/100ML-% IV SOLN: 2.0000 g | INTRAVENOUS | Status: DC

## 2022-02-02 MED ORDER — DEXMEDETOMIDINE HCL IN NACL 80 MCG/20ML IV SOLN
INTRAVENOUS | Status: DC | PRN
  Administered 2022-02-02: 4 ug via BUCCAL
  Administered 2022-02-02: 8 ug via BUCCAL
  Administered 2022-02-02 (×2): 4 ug via BUCCAL

## 2022-02-02 MED ORDER — THROMBIN 20000 UNITS EX SOLR: CUTANEOUS | Status: DC | PRN

## 2022-02-02 MED ORDER — ACETAMINOPHEN 650 MG RE SUPP: 650.0000 mg | RECTAL | Status: DC | PRN

## 2022-02-02 MED ORDER — MIDAZOLAM HCL 2 MG/2ML IJ SOLN
INTRAMUSCULAR | Status: AC
  Filled 2022-02-02: qty 2

## 2022-02-02 MED ORDER — OXYCODONE HCL 5 MG PO TABS
5.0000 mg | ORAL_TABLET | ORAL | Status: DC | PRN
  Administered 2022-02-03 – 2022-02-04 (×3): 5 mg via ORAL
  Filled 2022-02-02 (×3): qty 1

## 2022-02-02 MED ORDER — KETAMINE HCL 50 MG/5ML IJ SOSY
PREFILLED_SYRINGE | INTRAMUSCULAR | Status: AC
  Filled 2022-02-02: qty 5

## 2022-02-02 MED ORDER — SODIUM CHLORIDE 0.9 % IV SOLN: 250.0000 mL | INTRAVENOUS | Status: DC

## 2022-02-02 MED ORDER — COLESTIPOL HCL 1 G PO TABS
1.0000 g | ORAL_TABLET | Freq: Every day | ORAL | Status: DC
  Administered 2022-02-03 – 2022-02-04 (×2): 1 g via ORAL
  Filled 2022-02-02 (×2): qty 1

## 2022-02-02 MED ORDER — GABAPENTIN 400 MG PO CAPS
800.0000 mg | ORAL_CAPSULE | Freq: Every day | ORAL | Status: DC
  Administered 2022-02-02 – 2022-02-03 (×2): 800 mg via ORAL
  Filled 2022-02-02 (×2): qty 2

## 2022-02-02 MED ORDER — SUGAMMADEX SODIUM 200 MG/2ML IV SOLN
INTRAVENOUS | Status: DC | PRN
  Administered 2022-02-02: 200 mg via INTRAVENOUS

## 2022-02-02 MED ORDER — MIDAZOLAM HCL 2 MG/2ML IJ SOLN
INTRAMUSCULAR | Status: DC | PRN
  Administered 2022-02-02 (×2): .5 mg via INTRAVENOUS
  Administered 2022-02-02: 1 mg via INTRAVENOUS

## 2022-02-02 MED ORDER — ONDANSETRON HCL 4 MG/2ML IJ SOLN
INTRAMUSCULAR | Status: AC
  Filled 2022-02-02: qty 2

## 2022-02-02 MED ORDER — METHOCARBAMOL 500 MG PO TABS
ORAL_TABLET | ORAL | Status: AC
  Filled 2022-02-02: qty 1

## 2022-02-02 MED ORDER — BUPIVACAINE-EPINEPHRINE (PF) 0.5% -1:200000 IJ SOLN
INTRAMUSCULAR | Status: DC | PRN
  Administered 2022-02-02: 20 mL via PERINEURAL

## 2022-02-02 MED ORDER — DEXAMETHASONE SODIUM PHOSPHATE 10 MG/ML IJ SOLN
INTRAMUSCULAR | Status: DC | PRN
  Administered 2022-02-02: 10 mg via INTRAVENOUS

## 2022-02-02 MED ORDER — LACTATED RINGERS IV SOLN: INTRAVENOUS | Status: DC

## 2022-02-02 MED ORDER — METHOCARBAMOL 1000 MG/10ML IJ SOLN
500.0000 mg | Freq: Four times a day (QID) | INTRAVENOUS | Status: DC | PRN
  Filled 2022-02-02: qty 5

## 2022-02-02 MED ORDER — FENTANYL CITRATE (PF) 250 MCG/5ML IJ SOLN
INTRAMUSCULAR | Status: AC
  Filled 2022-02-02: qty 5

## 2022-02-02 MED ORDER — POLYETHYLENE GLYCOL 3350 17 G PO PACK: 17.0000 g | PACK | Freq: Every day | ORAL | Status: DC | PRN

## 2022-02-02 MED ORDER — CLEVIDIPINE BUTYRATE 0.5 MG/ML IV EMUL
INTRAVENOUS | Status: DC | PRN
  Administered 2022-02-02: 2 mg/h via INTRAVENOUS

## 2022-02-02 MED ORDER — BISACODYL 5 MG PO TBEC
5.0000 mg | DELAYED_RELEASE_TABLET | Freq: Every day | ORAL | Status: DC | PRN
  Administered 2022-02-02: 5 mg via ORAL
  Administered 2022-02-03: 10 mg via ORAL
  Filled 2022-02-02: qty 1
  Filled 2022-02-02: qty 2

## 2022-02-02 MED ORDER — OXYCODONE HCL 5 MG PO TABS
10.0000 mg | ORAL_TABLET | ORAL | Status: DC | PRN
  Administered 2022-02-02 – 2022-02-04 (×6): 10 mg via ORAL
  Filled 2022-02-02 (×5): qty 2

## 2022-02-02 MED ORDER — SODIUM CHLORIDE 0.9% FLUSH: 3.0000 mL | INTRAVENOUS | Status: DC | PRN

## 2022-02-02 MED ORDER — BUPIVACAINE-EPINEPHRINE (PF) 0.25% -1:200000 IJ SOLN
INTRAMUSCULAR | Status: AC
  Filled 2022-02-02: qty 30

## 2022-02-02 MED ORDER — SODIUM CHLORIDE 0.9% FLUSH
3.0000 mL | Freq: Two times a day (BID) | INTRAVENOUS | Status: DC
  Administered 2022-02-02 – 2022-02-03 (×3): 3 mL via INTRAVENOUS

## 2022-02-02 MED ORDER — BUDESONIDE 3 MG PO CPEP
3.0000 mg | ORAL_CAPSULE | Freq: Every day | ORAL | Status: DC
  Administered 2022-02-03 – 2022-02-04 (×2): 3 mg via ORAL
  Filled 2022-02-02 (×2): qty 1

## 2022-02-02 MED ORDER — DEXAMETHASONE SODIUM PHOSPHATE 10 MG/ML IJ SOLN
INTRAMUSCULAR | Status: AC
  Filled 2022-02-02: qty 1

## 2022-02-02 MED ORDER — PHENYLEPHRINE HCL-NACL 20-0.9 MG/250ML-% IV SOLN
INTRAVENOUS | Status: DC | PRN
  Administered 2022-02-02: 20 ug/min via INTRAVENOUS

## 2022-02-02 MED ORDER — PROPOFOL 10 MG/ML IV BOLUS
INTRAVENOUS | Status: AC
  Filled 2022-02-02: qty 20

## 2022-02-02 MED ORDER — STERILE WATER FOR IRRIGATION IR SOLN
Status: DC | PRN
  Administered 2022-02-02: 1000 mL

## 2022-02-02 MED ORDER — CEFAZOLIN SODIUM-DEXTROSE 2-4 GM/100ML-% IV SOLN
2.0000 g | INTRAVENOUS | Status: AC
  Administered 2022-02-02: 2 g via INTRAVENOUS
  Filled 2022-02-02: qty 100

## 2022-02-02 MED ORDER — 0.9 % SODIUM CHLORIDE (POUR BTL) OPTIME
TOPICAL | Status: DC | PRN
  Administered 2022-02-02 (×2): 1000 mL

## 2022-02-02 MED ORDER — MAGNESIUM CITRATE PO SOLN
1.0000 | Freq: Once | ORAL | Status: DC | PRN
  Filled 2022-02-02: qty 296

## 2022-02-02 MED ORDER — HYDROMORPHONE HCL 1 MG/ML IJ SOLN
0.2500 mg | INTRAMUSCULAR | Status: DC | PRN
  Administered 2022-02-02 (×4): 0.5 mg via INTRAVENOUS

## 2022-02-02 MED ORDER — THROMBIN 20000 UNITS EX SOLR
CUTANEOUS | Status: AC
  Filled 2022-02-02: qty 20000

## 2022-02-02 MED ORDER — CLEVIDIPINE BUTYRATE 0.5 MG/ML IV EMUL
INTRAVENOUS | Status: AC
  Filled 2022-02-02: qty 50

## 2022-02-02 MED ORDER — TRANEXAMIC ACID-NACL 1000-0.7 MG/100ML-% IV SOLN
1000.0000 mg | INTRAVENOUS | Status: AC
  Administered 2022-02-02: 1000 mg via INTRAVENOUS
  Filled 2022-02-02: qty 100

## 2022-02-02 MED ORDER — PHENYLEPHRINE HCL (PRESSORS) 10 MG/ML IV SOLN
INTRAVENOUS | Status: DC | PRN
  Administered 2022-02-02 (×3): 40 ug via INTRAVENOUS

## 2022-02-02 MED ORDER — ATORVASTATIN CALCIUM 40 MG PO TABS
40.0000 mg | ORAL_TABLET | Freq: Every day | ORAL | Status: DC
  Administered 2022-02-02 – 2022-02-03 (×2): 40 mg via ORAL
  Filled 2022-02-02 (×2): qty 1

## 2022-02-02 MED ORDER — ORAL CARE MOUTH RINSE: 15.0000 mL | Freq: Once | OROMUCOSAL | Status: AC

## 2022-02-02 MED ORDER — PHENOL 1.4 % MT LIQD: 1.0000 | OROMUCOSAL | Status: DC | PRN

## 2022-02-02 MED ORDER — LIDOCAINE 2% (20 MG/ML) 5 ML SYRINGE
INTRAMUSCULAR | Status: DC | PRN
  Administered 2022-02-02: 60 mg via INTRAVENOUS

## 2022-02-02 MED ORDER — LACTATED RINGERS IV SOLN: INTRAVENOUS | Status: DC | PRN

## 2022-02-02 MED ORDER — BUPIVACAINE-EPINEPHRINE 0.25% -1:200000 IJ SOLN: INTRAMUSCULAR | Status: DC | PRN

## 2022-02-02 MED ORDER — BUPIVACAINE LIPOSOME 1.3 % IJ SUSP
INTRAMUSCULAR | Status: DC | PRN
  Administered 2022-02-02: 10 mL via PERINEURAL

## 2022-02-02 MED ORDER — ACETAMINOPHEN 10 MG/ML IV SOLN
INTRAVENOUS | Status: DC | PRN
  Administered 2022-02-02: 1000 mg via INTRAVENOUS

## 2022-02-02 MED ORDER — CHLORHEXIDINE GLUCONATE 0.12 % MT SOLN
15.0000 mL | Freq: Once | OROMUCOSAL | Status: AC
  Administered 2022-02-02: 15 mL via OROMUCOSAL
  Filled 2022-02-02: qty 15

## 2022-02-02 MED ORDER — THROMBIN 20000 UNITS EX KIT: PACK | CUTANEOUS | Status: DC | PRN

## 2022-02-02 MED ORDER — CEFAZOLIN SODIUM-DEXTROSE 1-4 GM/50ML-% IV SOLN
1.0000 g | Freq: Three times a day (TID) | INTRAVENOUS | Status: AC
  Administered 2022-02-02 (×2): 1 g via INTRAVENOUS
  Filled 2022-02-02 (×2): qty 50

## 2022-02-02 MED ORDER — PROPOFOL 500 MG/50ML IV EMUL
INTRAVENOUS | Status: DC | PRN
  Administered 2022-02-02: 30 ug/kg/min via INTRAVENOUS

## 2022-02-02 MED ORDER — ACETAMINOPHEN 10 MG/ML IV SOLN
INTRAVENOUS | Status: AC
  Filled 2022-02-02: qty 100

## 2022-02-02 MED ORDER — ROCURONIUM BROMIDE 10 MG/ML (PF) SYRINGE
PREFILLED_SYRINGE | INTRAVENOUS | Status: DC | PRN
  Administered 2022-02-02 (×2): 20 mg via INTRAVENOUS
  Administered 2022-02-02: 50 mg via INTRAVENOUS
  Administered 2022-02-02 (×2): 10 mg via INTRAVENOUS

## 2022-02-02 MED ORDER — OXYCODONE HCL 5 MG PO TABS
ORAL_TABLET | ORAL | Status: AC
  Filled 2022-02-02: qty 2

## 2022-02-02 MED ORDER — FENTANYL CITRATE (PF) 250 MCG/5ML IJ SOLN
INTRAMUSCULAR | Status: DC | PRN
  Administered 2022-02-02 (×4): 50 ug via INTRAVENOUS
  Administered 2022-02-02: 100 ug via INTRAVENOUS
  Administered 2022-02-02 (×4): 50 ug via INTRAVENOUS

## 2022-02-02 MED ORDER — METHOCARBAMOL 500 MG PO TABS
500.0000 mg | ORAL_TABLET | Freq: Four times a day (QID) | ORAL | Status: DC | PRN
  Administered 2022-02-02 – 2022-02-04 (×5): 500 mg via ORAL
  Filled 2022-02-02 (×4): qty 1

## 2022-02-02 MED ORDER — HYDROMORPHONE HCL 1 MG/ML IJ SOLN: 1.0000 mg | INTRAMUSCULAR | Status: DC | PRN

## 2022-02-02 MED ORDER — DEXMEDETOMIDINE HCL IN NACL 80 MCG/20ML IV SOLN
INTRAVENOUS | Status: AC
  Filled 2022-02-02: qty 20

## 2022-02-02 MED ORDER — ACETAMINOPHEN 325 MG PO TABS
650.0000 mg | ORAL_TABLET | ORAL | Status: DC | PRN
  Administered 2022-02-02 – 2022-02-04 (×7): 650 mg via ORAL
  Filled 2022-02-02 (×7): qty 2

## 2022-02-02 MED ORDER — KETAMINE HCL 10 MG/ML IJ SOLN
INTRAMUSCULAR | Status: DC | PRN
  Administered 2022-02-02 (×2): 10 mg via INTRAVENOUS
  Administered 2022-02-02: 20 mg via INTRAVENOUS
  Administered 2022-02-02: 10 mg via INTRAVENOUS

## 2022-02-02 MED ORDER — PROPOFOL 10 MG/ML IV BOLUS
INTRAVENOUS | Status: DC | PRN
  Administered 2022-02-02: 50 mg via INTRAVENOUS
  Administered 2022-02-02: 130 mg via INTRAVENOUS
  Administered 2022-02-02: 20 mg via INTRAVENOUS

## 2022-02-02 MED ORDER — ONDANSETRON HCL 4 MG PO TABS: 4.0000 mg | ORAL_TABLET | Freq: Four times a day (QID) | ORAL | Status: DC | PRN

## 2022-02-02 MED ORDER — LIDOCAINE 2% (20 MG/ML) 5 ML SYRINGE
INTRAMUSCULAR | Status: AC
  Filled 2022-02-02: qty 5

## 2022-02-02 MED ORDER — MENTHOL 3 MG MT LOZG: 1.0000 | LOZENGE | OROMUCOSAL | Status: DC | PRN

## 2022-02-02 MED ORDER — ALBUTEROL SULFATE (2.5 MG/3ML) 0.083% IN NEBU: 2.5000 mg | INHALATION_SOLUTION | RESPIRATORY_TRACT | Status: DC | PRN

## 2022-02-02 SURGICAL SUPPLY — 86 items
APPLIER CLIP 11 MED OPEN (CLIP) ×1
BAG COUNTER SPONGE SURGICOUNT (BAG) ×2 IMPLANT
BLADE CLIPPER SURG (BLADE) IMPLANT
BLADE ILLUMINATOR MIS (MISCELLANEOUS) IMPLANT
BLADE RETRACTOR RADLUC 26X140 (BLADE) IMPLANT
BLADE SURG 10 STRL SS (BLADE) ×1 IMPLANT
CATH FOLEY 2WAY SLVR  5CC 16FR (CATHETERS) ×1
CATH FOLEY 2WAY SLVR 5CC 16FR (CATHETERS) ×1 IMPLANT
CLIP APPLIE 11 MED OPEN (CLIP) ×1 IMPLANT
CLIP LIGATING EXTRA MED SLVR (CLIP) IMPLANT
CLSR STERI-STRIP ANTIMIC 1/2X4 (GAUZE/BANDAGES/DRESSINGS) IMPLANT
COVER SURGICAL LIGHT HANDLE (MISCELLANEOUS) ×1 IMPLANT
DISSECTOR BLUNT TIP ENDO 5MM (MISCELLANEOUS) IMPLANT
DISSECTOR STICK (MISCELLANEOUS) IMPLANT
DRAIN CHANNEL 15F RND FF W/TCR (WOUND CARE) IMPLANT
DRAPE C-ARM 42X72 X-RAY (DRAPES) ×1 IMPLANT
DRAPE C-ARMOR (DRAPES) ×1 IMPLANT
DRAPE INCISE IOBAN 66X45 STRL (DRAPES) IMPLANT
DRSG OPSITE POSTOP 4X10 (GAUZE/BANDAGES/DRESSINGS) IMPLANT
DRSG OPSITE POSTOP 4X6 (GAUZE/BANDAGES/DRESSINGS) ×1 IMPLANT
DURAPREP 26ML APPLICATOR (WOUND CARE) ×1 IMPLANT
ELECT BLADE 4.0 EZ CLEAN MEGAD (MISCELLANEOUS) ×2
ELECT PENCIL ROCKER SW 15FT (MISCELLANEOUS) ×1 IMPLANT
ELECT REM PT RETURN 9FT ADLT (ELECTROSURGICAL) ×1
ELECTRODE BLDE 4.0 EZ CLN MEGD (MISCELLANEOUS) ×2 IMPLANT
ELECTRODE REM PT RTRN 9FT ADLT (ELECTROSURGICAL) ×1 IMPLANT
GLOVE BIO SURGEON STRL SZ 6.5 (GLOVE) ×1 IMPLANT
GLOVE BIO SURGEON STRL SZ7.5 (GLOVE) ×1 IMPLANT
GLOVE BIOGEL PI IND STRL 6.5 (GLOVE) ×1 IMPLANT
GLOVE BIOGEL PI IND STRL 8 (GLOVE) ×1 IMPLANT
GLOVE BIOGEL PI IND STRL 8.5 (GLOVE) ×1 IMPLANT
GLOVE SS BIOGEL STRL SZ 8.5 (GLOVE) ×1 IMPLANT
GOWN STRL REUS W/ TWL LRG LVL3 (GOWN DISPOSABLE) ×2 IMPLANT
GOWN STRL REUS W/ TWL XL LVL3 (GOWN DISPOSABLE) ×3 IMPLANT
GOWN STRL REUS W/TWL 2XL LVL3 (GOWN DISPOSABLE) ×2 IMPLANT
GOWN STRL REUS W/TWL LRG LVL3 (GOWN DISPOSABLE) ×2
GOWN STRL REUS W/TWL XL LVL3 (GOWN DISPOSABLE) ×3
INSERT FOGARTY 61MM (MISCELLANEOUS) IMPLANT
INSERT FOGARTY SM (MISCELLANEOUS) IMPLANT
KIT BASIN OR (CUSTOM PROCEDURE TRAY) ×1 IMPLANT
KIT TURNOVER KIT B (KITS) ×1 IMPLANT
MIX DBX 10CC 35% BONE (Bone Implant) IMPLANT
NDL 22X1.5 STRL (OR ONLY) (MISCELLANEOUS) ×1 IMPLANT
NDL SPNL 18GX3.5 QUINCKE PK (NEEDLE) ×1 IMPLANT
NEEDLE 22X1.5 STRL (OR ONLY) (MISCELLANEOUS) ×1 IMPLANT
NEEDLE SPNL 18GX3.5 QUINCKE PK (NEEDLE) ×1 IMPLANT
NS IRRIG 1000ML POUR BTL (IV SOLUTION) ×1 IMPLANT
PACK LAMINECTOMY ORTHO (CUSTOM PROCEDURE TRAY) ×1 IMPLANT
PACK UNIVERSAL I (CUSTOM PROCEDURE TRAY) ×1 IMPLANT
PAD ARMBOARD 7.5X6 YLW CONV (MISCELLANEOUS) ×4 IMPLANT
PUTTY DBM INSTAFILL CART 5CC (Putty) IMPLANT
SPACER RISE-L 18X45 8-15MM6DEG (Spacer) IMPLANT
SPONGE INTESTINAL PEANUT (DISPOSABLE) ×2 IMPLANT
SPONGE SURGIFOAM ABS GEL 100 (HEMOSTASIS) ×1 IMPLANT
SPONGE T-LAP 18X18 ~~LOC~~+RFID (SPONGE) ×1 IMPLANT
SPONGE T-LAP 4X18 ~~LOC~~+RFID (SPONGE) ×1 IMPLANT
STAPLER VISISTAT 35W (STAPLE) ×1 IMPLANT
SURGIFLO W/THROMBIN 8M KIT (HEMOSTASIS) IMPLANT
SUT BONE WAX W31G (SUTURE) IMPLANT
SUT MNCRL AB 3-0 PS2 27 (SUTURE) ×2 IMPLANT
SUT MNCRL+ AB 3-0 CT1 36 (SUTURE) IMPLANT
SUT MONOCRYL AB 3-0 CT1 36IN (SUTURE) ×1
SUT PROLENE 4 0 RB 1 (SUTURE)
SUT PROLENE 4-0 RB1 .5 CRCL 36 (SUTURE) IMPLANT
SUT PROLENE 5 0 CC1 (SUTURE) IMPLANT
SUT PROLENE 6 0 C 1 30 (SUTURE) IMPLANT
SUT PROLENE 6 0 CC (SUTURE) IMPLANT
SUT SILK 0 TIES 10X30 (SUTURE) IMPLANT
SUT SILK 2 0 TIES 10X30 (SUTURE) ×1 IMPLANT
SUT SILK 2 0SH CR/8 30 (SUTURE) IMPLANT
SUT SILK 3 0 TIES 17X18 (SUTURE) ×1
SUT SILK 3 0SH CR/8 30 (SUTURE) IMPLANT
SUT SILK 3-0 18XBRD TIE BLK (SUTURE) IMPLANT
SUT VIC AB 0 CT1 27 (SUTURE) ×1
SUT VIC AB 0 CT1 27XBRD ANBCTR (SUTURE) IMPLANT
SUT VIC AB 1 CT1 18XCR BRD 8 (SUTURE) ×2 IMPLANT
SUT VIC AB 1 CT1 8-18 (SUTURE) ×1
SUT VIC AB 2-0 CT1 18 (SUTURE) ×2 IMPLANT
SYR BULB IRRIG 60ML STRL (SYRINGE) ×1 IMPLANT
SYR CONTROL 10ML LL (SYRINGE) IMPLANT
TAPE CLOTH 4X10 WHT NS (GAUZE/BANDAGES/DRESSINGS) ×1 IMPLANT
TIP CONICAL INSTAFILL (ORTHOPEDIC DISPOSABLE SUPPLIES) IMPLANT
TOWEL GREEN STERILE (TOWEL DISPOSABLE) ×2 IMPLANT
TOWEL GREEN STERILE FF (TOWEL DISPOSABLE) ×1 IMPLANT
WATER STERILE IRR 1000ML POUR (IV SOLUTION) ×1 IMPLANT
YANKAUER SUCT BULB TIP NO VENT (SUCTIONS) IMPLANT

## 2022-02-02 NOTE — Transfer of Care (Signed)
Immediate Anesthesia Transfer of Care Note  Patient: Beverly [name]  Procedure(s) Performed: OBLIQUE LUMBAR INTERBODY FUSION LUMBAR THREE TO FIVE (Spine Lumbar) ABDOMINAL EXPOSURE (Spine Lumbar)  Patient Location: PACU  Anesthesia Type:GA combined with regional for post-op pain  Level of Consciousness: awake, drowsy, and patient cooperative  Airway & Oxygen Therapy: Patient Spontanous Breathing and Patient connected to face mask oxygen  Post-op Assessment: Report given to RN, Post -op Vital signs reviewed and stable, and Patient moving all extremities X 4  Post vital signs: Reviewed and stable  Last Vitals:  Vitals Value Taken Time  BP 194/77 02/02/22 1217  Temp    Pulse 71 02/02/22 1218  Resp 19 02/02/22 1218  SpO2 97 % 02/02/22 1218  Vitals shown include unvalidated device data.  Last Pain:  Vitals:   02/02/22 0601  TempSrc:   PainSc: 6       Patients Stated Pain Goal: 0 (28/20/60 1561)  Complications: No notable events documented.

## 2022-02-02 NOTE — Anesthesia Procedure Notes (Signed)
Procedure Name: Intubation Date/Time: 02/02/2022 7:46 AM  Performed by: Heide Scales, CRNAPre-anesthesia Checklist: Patient identified, Emergency Drugs available, Suction available and Patient being monitored Patient Re-evaluated:Patient Re-evaluated prior to induction Oxygen Delivery Method: Circle system utilized Preoxygenation: Pre-oxygenation with 100% oxygen Induction Type: IV induction and Cricoid Pressure applied Ventilation: Mask ventilation without difficulty Laryngoscope Size: Mac and 3 Grade View: Grade I Tube type: Oral Tube size: 7.0 mm Number of attempts: 1 Airway Equipment and Method: Stylet and Oral airway Placement Confirmation: ETT inserted through vocal cords under direct vision, positive ETCO2 and breath sounds checked- equal and bilateral Secured at: 21 cm Tube secured with: Tape Dental Injury: Teeth and Oropharynx as per pre-operative assessment

## 2022-02-02 NOTE — H&P (Signed)
History: Beverly [name] returns today because of ongoing severe debilitating back buttock and bilateral lower extremity pain. We have again gone over her x-rays and MRI. She has multiple issues including spondylolisthesis, advanced degenerative disc disease, and degenerative scoliosis. At this point I do believe the primary pain generator causing her back buttock and nerve genic claudication is the L3-4 and L4-5 degenerative disc disease with spinal stenosis and spondylolisthesis.  Plan moving forward with a phased two-level lumbar fusion L3-5.  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   right sholder   BCC (basal cell carcinoma of skin) 05/09/2014   mid lower back   BCC (basal cell carcinoma of skin) 05/03/2017   right low back   BCC (basal cell carcinoma of skin) 05/03/2017   left upper back   BCC (basal cell carcinoma of skin) 07/05/2018   left mid back   BCC (basal cell carcinoma of skin) 05/20/1992   upper back   BCC (basal cell carcinoma of skin) 07/29/1993   left sholder medial   BCC (basal cell carcinoma of skin) 07/29/1993   left sholder lateral   BCC (basal cell carcinoma of skin) 07/29/1993   right thigh   BCC (basal cell carcinoma of skin) 07/29/1993   right sholder   BCC (basal cell carcinoma of skin) 12/22/1994   right mid forearm   BCC (basal cell carcinoma of skin) 12/22/1994   right upper forearm   BCC (basal cell carcinoma of skin) 12/22/1994   lower right upper arm   BCC (basal cell carcinoma of skin) 12/22/1994   right upper arm sholder   BCC (basal cell carcinoma of skin) 08/11/1995   left leg below knee   BCC (basal cell carcinoma of skin) 04/11/2002   mid back   BCC (basal cell carcinoma of skin) 12/10/2002    right center upper back   BCC (basal cell carcinoma of skin) 05/26/2005   right post sholder   BCC (basal cell carcinoma) 05/09/2014   left inner shin   BCC (basal cell carcinoma) 06/12/2014   left forearm   Bowen's disease 10/07/1994   right post knee, right inner forearm/wrist   Bowen's disease 08/11/1995   left sholder   Bronchiectasis ([place])    Cataract    left   Chronic pain    Common variable immunodeficiency [facility])    sees dr Gayleen Orem 02-11-2021 epic   Coronary artery disease    Depression    hx of   Diverticulosis of colon 1998   mild   DJD (degenerative joint disease)    Duodenal ulcer    h/o yrs ago   GERD (gastroesophageal reflux disease)    History of SCC (squamous cell carcinoma) of skin    Dr. Denna Haggard   History of sinus bradycardia    HLD (hyperlipidemia)    hypertriglyceridemia   Hypertensive retinopathy of both eyes 11/18/2017   Hypothyroidism    IBS (irritable bowel syndrome)    Internal hemorrhoids 1998   Lymphocytic colitis    Mitral regurgitation    mild   Nocardiosis    relased by infection disease dec 2022   Osteoarthritis    feet,shoulder,neck,back,hips and  hands.   Pneumonia 2015   PONV (postoperative nausea and vomiting)    Rotator cuff tear, right 02/2019   infraspinatus and supraspinatus, and dislocation of long head of bicep tendon (Dr. Alvan Dame)   SCC (squamous cell carcinoma) 11/26/2014   left hand, right hand, right deltoid mnay areas   SCC (squamous cell carcinoma) 05/03/2017   left cheek   Sleep apnea    uses a mouth guard nightly   Squamous cell carcinoma in situ (SCCIS) 07/05/2018   left hand   Tracheobronchomalacia    Vitamin D deficiency    mild    Allergies  Allergen Reactions   Adhesive [Tape] Rash    Blister    Codeine Rash    No current facility-administered medications on file prior to encounter.   Current Outpatient Medications on File Prior to Encounter  Medication Sig Dispense Refill   acetaminophen  (TYLENOL) 500 MG tablet Take 1,000 mg by mouth as needed for moderate pain.     budesonide (ENTOCORT EC) 3 MG 24 hr capsule Take 1 capsule (3 mg total) by mouth daily. 90 capsule 5   Buprenorphine HCl (BELBUCA) 750 MCG FILM Place 750 mcg film inside cheek to dissolve 2 (two) times daily. 60 each 2   Calcium Carbonate-Vitamin D 600-200 MG-UNIT TABS Take 1 tablet by mouth 2 (two) times daily.     colestipol (COLESTID) 1 g tablet Take 1 tablet by mouth daily. 90 tablet 1   diclofenac Sodium (VOLTAREN) 1 % GEL Apply 1 Application topically as needed (joint pain).     Docusate Sodium 100 MG capsule Take 100 mg by mouth in the morning and at bedtime.     EPINEPHrine 0.3 mg/0.3 mL IJ SOAJ injection Inject 0.3 mg into the muscle as needed for anaphylaxis.     gabapentin (NEURONTIN) 800 MG tablet Take 1 tablet by mouth at bedtime. (Patient taking differently: Take 800 mg by mouth at bedtime.) 90 tablet 2   HYDROcodone-acetaminophen (NORCO) 7.5-325 MG tablet Take 1 tablet by mouth 4 (four) times daily as needed for pain. (Patient taking differently: Take 1 tablet by mouth daily as needed for moderate pain.) 120 tablet 0   Lactobacillus-Inulin (CULTURELLE ADULT ULT BALANCE) CAPS Take 1 capsule by mouth daily.     methocarbamol (ROBAXIN) 500 MG tablet Take 1 tablet by mouth three times a day as needed (Patient taking differently: Take 500 mg by mouth 2 (two) times daily.) 270 tablet 2   Multiple Vitamins-Minerals (MULTIVITAMIN WITH MINERALS) tablet Take 1 tablet by mouth daily.     pantoprazole (PROTONIX) 40 MG tablet Take 1 tablet (40 mg) twice daily for 10 weeks then once a day as per GI recommendations (Patient taking differently: Take 40 mg by mouth daily.) 60 tablet 1   XEMBIFY 10 GM/50ML SOLN Inject 10 g into the skin once a week. Wednesday     albuterol (VENTOLIN HFA) 108 (90 Base) MCG/ACT inhaler Inhale 2 puffs into the lungs every 6 (six) hours as needed for wheezing or shortness of breath. (Patient  taking differently: Inhale 1 puff into the lungs as needed for shortness of breath or wheezing.) 18 g 1   Fluorouracil (TOLAK) 4 % CREA Apply to aa total 28 applications & sun protect (Patient taking differently: Apply 1 Application topically daily. For two weeks during the winter) 40 g 0   nitroGLYCERIN (NITROSTAT) 0.4 MG SL tablet Place 1 tablet (0.4 mg total) under the tongue every 5 (five) minutes as needed. 25 tablet 2  ondansetron (ZOFRAN-ODT) 4 MG disintegrating tablet Take 1 tablet by mouth every 6 (six) hours as needed for nausea or vomiting. (Patient taking differently: Take 4 mg by mouth as needed for nausea or vomiting.) 30 tablet 1   [DISCONTINUED] potassium chloride (KLOR-CON) 10 MEQ tablet Take 2 tablets (total 20 MEQ) by mouth once 2 tablet 0    Physical Exam: Vitals:   02/02/22 0543  BP: (!) 143/68  Pulse: (!) 59  Resp: 16  Temp: 98.7 F (37.1 C)  SpO2: 94%   Body mass index is 25.69 kg/m. Clinical exam: Beverly [name] is a pleasant individual, who appears younger than their stated age.  She is alert and orientated 3.  No shortness of breath, chest pain.  Abdomen is soft and non-tender, positive urinary urgency, no loss of bowel control, no rebound tenderness. Patient denies numbness or dysesthesias in the saddle and vaginal region.  Negative: skin lesions abrasions contusions  Peripheral pulses: 2+ dorsalis pedis/posterior tibialis pulses bilaterally. Compartment soft and nontender.  Gait pattern: Slightly altered gait pattern due to neuropathic right leg pain  Assistive devices: None  Neuro: 5/5 motor strength in the lower extremity bilaterally. Positive numbness and dysesthesias in the right L3, L4, and L5 dermatome. Negative straight leg raise test, negative femoral stretch test. Negative Babinski test, no clonus. 2+ deep tendon reflexes at the knee and Achilles.  Musculoskeletal: Significant low back pain especially with extension and rotation of the lumbar spine.  Pain radiates into the paraspinal region in the right lower extremity. No hip, knee, ankle pain with isolated joint range of motion. No SI joint pain with direct palpation.  Imaging: X-rays of the lumbar spine demonstrate a grade 2 degenerative spondylolisthesis at L4-5 with facet arthrosis. Advanced degenerative disc disease at L3-4. Moderate degenerative disc disease L5-S1. Patient has 16 degree apex of the right degenerative scoliosis T12-L4  Lumbar MRI: completed on 02/18/2021. No fracture or abnormal marrow signal change. Severe facet arthropathy with spondylolisthesis at L4-5 and to a lesser degree L5-S1. Patient has advanced degenerative disc disease L3-4 with severe right foraminal stenosis affecting the right L3 nerve root. Borderline grade 2 spondylolisthesis L4-5 with moderate to severe bilateral foraminal stenosis. There is mild right foraminal stenosis and severe left foraminal stenosis at L5-S1.  Lumbar MRI: completed on 12/03/2021. No fracture or abnormal marrow signal change. Left posterior lateral disc protrusion at T12/L1 without neural impingement. Unchanged moderate foraminal stenosis L1/2 and L2/3. Unchanged foraminal stenosis and mild central stenosis L3-4. Moderate to moderate severe central and lateral recess stenosis at L4-5 and mild to moderate by foraminal stenosis at L5-S1. Borderline grade 2 spondylolisthesis L4-5. MRI is essentially unchanged from 02/18/2021.   A/P: Summary: Beverly [name] returns today because of ongoing severe debilitating back buttock and bilateral lower extremity pain. We have again gone over her x-rays and MRI. She has multiple issues including spondylolisthesis, advanced degenerative disc disease, and degenerative scoliosis. At this point I do believe the primary pain generator causing her back buttock and nerve genic claudication is the L3-4 and L4-5 degenerative disc disease with spinal stenosis and spondylolisthesis. At this point she is expressed an  understanding that she may always have issues with her back and legs but if she can get some relief with surgery that she is willing to move forward. We briefly discussed addressing all of the pathology with an extensive fusion which she does not want to entertain. We also discussed a limited procedure as we did in the past and she is interested  in moving forward with that. I believe that is probably our best option.  Surgical plan: OLIF L3-4 and L4-5 with supplemental posterior pedicle screw fixation and possible posterior decompression L3-5. The surgical plan would be to do a staged anterior posterior procedure. If she did not get relief from the neurogenic claudication with the interbody fixation and indirect decompression then while doing the posterior pedicle screw fixation I would also do a limited decompression. We did talk about the fact that these scoliosis can progress and producing more extensive back pain, as well as developing adjacent segment disease and recurrent radicular leg pain. The patient is expressed understanding of these and all risks. All of her questions were addressed.  OLIF/XLIF risks, benefits of surgery were reviewed with the patient. These include: infection, bleeding, death, stroke, paralysis, ongoing or worse pain, need for additional surgery, injury to the lumbar plexus resulting in hip flexor weakness and difficulty walking without assistive devices. Adjacent segment degenerative disease, need for additional surgery including fusing other levels, leak of spinal fluid, Nonunion, hardware failure, breakage, or mal-position. Deep venous thrombosis (DVT) requiring additional treatment such as filter, and/or medications. Injury to abdominal contents, loss in bowel and bladder control.  Risks and benefits of spinal fusion: Infection, bleeding, death, stroke, paralysis, ongoing or worse pain, need for additional surgery, nonunion, leak of spinal fluid, adjacent segment degeneration  requiring additional fusion surgery, Injury to abdominal vessels that can require anterior surgery to stop bleeding. Malposition of the cage and/or pedicle screws that could require additional surgery. Loss of bowel and bladder control. Postoperative hematoma causing neurologic compression that could require urgent or emergent re-operation.  Risks and benefits of lumbar decompression/discectomy: Infection, bleeding, death, stroke, paralysis, ongoing or worse pain, need for additional surgery, leak of spinal fluid, adjacent segment degeneration requiring additional surgery, post-operative hematoma formation that can result in neurological compromise and the need for urgent/emergent re-operation. Loss in bowel and bladder control. Injury to major vessels that could result in the need for urgent abdominal surgery to stop bleeding. Risk of deep venous thrombosis (DVT) and the need for additional treatment.

## 2022-02-02 NOTE — Op Note (Signed)
OPERATIVE REPORT  DATE OF SURGERY: 02/02/2022  PATIENT NAME:  KARIME SCHEUERMANN MRN: [id number] DOB: Aug 30, 1945  PCP: Isaac Bliss, Rayford Halsted, MD  PRE-OPERATIVE DIAGNOSIS: Degenerative scoliosis with degenerative disc disease L3-4 and L4-5 with lumbar spinal stenosis/radiculopathy  POST-OPERATIVE DIAGNOSIS: Same   PROCEDURE:   Oblique lumbar interbody fusion L3-5  SURGEON:  Melina Schools, MD  PHYSICIAN ASSISTANT: None  Approach surgeon: Dr. Monica Martinez  ANESTHESIA:   General  EBL: 465 ml   Complications: None  Implants: Globus expandable Rise-L intervertebral cage.  L3-4: 18 x 45 mm.  Expanded to a final height of 10.5 mm.  6 degree lordosis.  L4-5: 18 x 45.  Expanded to the height of 13 mm.  6 degree lordosis.  Graft: DBX mix  BRIEF HISTORY: KEYMONI MCCASTER is a 76 y.o. female who presented my office with complaints of severe back buttock and neuropathic leg pain.  Imaging demonstrated multilevel degenerative disc disease with underlying degenerative scoliosis.  Imaging confirmed severe spinal stenosis.  As result of the failure to improve with conservative management we elected to move forward with surgery.  All appropriate risks, benefits, alternatives were discussed with the patient and consent was obtained.  PROCEDURE DETAILS: Patient was brought into the operating room and was properly positioned on the operating room table.  After induction with general anesthesia the patient was endotracheally intubated.  A timeout was taken to confirm all important data: including patient, procedure, and the level. Teds, SCD's were applied.   The patient was turned into the left lateral decubitus position.  Axillary roll was placed underneath the right arm and the left upper extremity was properly positioned on the well arm holder.  The patient was then secured directly to the bed to ensure satisfactory images on x-ray.  The abdomen was then prepped and draped in a standard  fashion.  At this point time Dr. Carlis Abbott performed a standard oblique retroperitoneal approach to L3-4, and L4-5.  Please refer to his dictation for specifics.  Once the retractor was set in the L4-5 level was clearly visualized I marked it with a needle and then took an x-ray to confirm I was at the appropriate level.  Once this was confirmed Dr. Carlis Abbott scrubbed out and I proceeded with the surgery.  Annulotomy was performed and I used a combination of curettes, Kerrison rongeurs, and pituitary rongeurs to remove all of the disc material.  I then used a fine nerve hooks to continue my discectomy posteriorly.  I was able to place my small curved curette behind the vertebral body of L5 and released the annulus from the left pedicle to the right pedicle.  I did a similar release at behind the body of L4.  Having removed all the cartilaginous endplate to expose bleeding subchondral bone and being able to pass my nerve hook behind the vertebral body I was pleased with the overall discectomy/decompression.  Using live fluoroscopy I confirmed that I had parallel endplate distraction.  The implant was then obtained after measuring the length that was required.  The implant was packed with DBX mix and then gently inserted to the appropriate depth.  I confirmed satisfactory position in both planes.  I then expanded the intervertebral cage to a final height of approximately 13.5 mm.  The cage had excellent positioning in both the AP and lateral planes.  It was then backfilled with the DBX mix, and the remainder of the bone chips were then placed anterior to the cage to facilitate  a sentinel fusion.  At this point time Dr. Carlis Abbott returned and scrubbed in to reposition the retractor blades.  Once we now had the L3-4 level exposed Dr. Carlis Abbott scrubbed back into and I continued with the surgery.  Just as I done earlier an annulotomy of L3-4 was performed and using the same technique I used at L4-5 I performed a complete  discectomy at L3-4.  I used the Cobb elevators to release the contralateral annulus and then the box osteotome to remove the bulk of the disc material.  I then used the small fine curettes to remove the posterior aspect of the disc and then ultimately released the posterior annulus from the vertebral body of L3 and L4.  I again confirmed I had parallel endplate distraction with live fluoroscopy.  I then rasped the endplates I had bleeding subchondral bone.  The implant was then obtained and packed with allograft and then gently inserted and malleted to the appropriate depth.  I then expanded it to a final height of approximately 10.5 mm.  The cage was then backfilled with DBX mix and I placed additional bone graft anterior to the cage to facilitate a sentinel fusion.  At this point I irrigated the wound copiously with normal saline and made sure that hemostasis with bipolar cautery.  I sequentially removed all of the retractors confirmed hemostasis again.  The fascia of the external oblique was then closed with a running #1 PDS suture.  I then closed the remaining layers with a running 0 Vicryl suture, interrupted 2-0 Vicryl suture and finally 3-0 Monocryl for the skin.  Steri-Strips and a dry dressing were applied and the patient was ultimately extubated transfer the PACU without incident.  The end of the case all needle and sponge counts were correct.  We did get an x-ray which was read by the radiologist prior to closure which demonstrated no retained surgical instruments in the wound.  Patient was ultimately transferred to the PACU without incident.  Melina Schools, MD 02/02/2022 1:12 PM

## 2022-02-02 NOTE — OR Nursing (Signed)
Intra operative local abdomen x-ray read by Dr. Nyoka Cowden confirming no foreign objects retained in abdomen.

## 2022-02-02 NOTE — Op Note (Signed)
Date: February 02, 2022  Preoperative diagnosis: Chronic lower back pain with lower extremity radiculopathy  Postoperative diagnosis: Same  Procedure: Oblique retroperitoneal spine exposure at the L3-L4 and L4-L5 disc space for oblique lumbar interbody fusion (L3-L4 and L4-L5 OLIF)  Surgeon: Dr. Marty Heck, MD  Co-surgeon: Dr. Melina Schools, MD  Indications: 76 year old female with chronic lower extremity back pain with lower extremity radiculopathy.  She has been evaluated by Dr. Rolena Infante with spine surgery after failing conservative management.  She presents today for planned L3-L4 and L4-L5 OLIF and vascular surgery was asked to assist with spine exposure.  Patient presents after risks benefits discussed.  Findings: Patient was placed in the right lateral position and all pressure points padded.  The L3-L4 and L4-L5 disc space were marked over the left oblique muscles 2 fingerbreadths off the left iliac crest with a fluoroscopic C arm.  We made an incision here and ultimately dissected down and performed a muscle-sparing technique between the layers of the oblique muscles and then entered into the retroperitoneum by mobilizing the peritoneum and left ureter down.  The L4-L5 disc space was initially mobilized and placed fixed globus retractor here.  We then mobilized one level up and moved the retractor to the L3-L4 level.  Both levels were confirmed with fluoroscopic C arm.  Anesthesia: General  Details: Patient was taken to the operating room after informed consent was obtained.  Placed on the operative table in the supine position and general endotracheal anesthesia was induced.  Patient was then rolled in the right lateral position and all pressure points padded.  Fluoroscopic C-arm was then used to mark the L3-L4 and L4-L5 disc space over the left oblique muscles approximately 2 fingerbreadths off the left iliac crest.  The left-sided abdominal wall, left flank, and back were then  prepped and draped in standard sterile fashion.  Antibiotics were given and timeout performed.  Initially made my incision at the preoperative mark over the L3-L4 and L4-5 disc space on the left-sided oblique muscles.  Dissected down Bovie cautery and used cerebellar retractors.  The external oblique fascia was opened with Bovie cautery and then I performed a muscle-sparing technique between the layers of the oblique muscles and then visualized the peritoneum and that was then swept down including the left ureter to get into the retroperitoneum.  Visualized the left psoas muscle as well as the left iliac artery.  Initially went to the L4-5 disc space and used hand-held Wiley retractors to mobilize the left psoas muscle lateral to the disc space using hand-held Wiley and suction.  I did ligate 1 iliolumbar branch between clips and this was divided.  Once we had good working room here a fixed globus retractor was placed with a 140 reverse lip down to pull the peritoneum and left ureter away from the disc space and then two 140 reverse lips lateral to pull the left psoas lateral to the disc space.  A spinal needle was placed in the disc base and we confirmed we were at the correct level on lateral fluoroscopy at L4-L5.  Case was turned over to Dr. Rolena Infante.  Once Dr. Rolena Infante completed the implant at L4-L5, I was called back to the room.  I then went up one level again used hand-held Wiley retractors with suction and Kd to bluntly mobilized the left psoas muscle lateral to the disc space.  I ligated a second iliolumbar branch between a 2-0 silk and clips and this was divided as well.  I then moved fixed retractor up one level and placed a 160 reverse lip to pull the peritoneum and left ureter down again and then a 160 reverse lip and a 140 reverse lip to pull the left psoas lateral to the disc space again.  Again we confirmed we were at the correct L3-L4 level on lateral fluoroscopy and case was turned over to Dr.  Rolena Infante.  Complication: None  Condition: Stable  Marty Heck, MD [facility] Office: [place]

## 2022-02-02 NOTE — Anesthesia Procedure Notes (Signed)
Anesthesia Regional Block: TAP block   Pre-Anesthetic Checklist: , timeout performed,  Correct Patient, Correct Site, Correct Laterality,  Correct Procedure, Correct Position, site marked,  Risks and benefits discussed,  Pre-op evaluation,  At surgeon's request and post-op pain management  Laterality: Left  Prep: Maximum Sterile Barrier Precautions used, chloraprep       Needles:  Injection technique: Single-shot  Needle Type: Echogenic Stimulator Needle     Needle Length: 9cm  Needle Gauge: 21     Additional Needles:   Procedures:,,,, ultrasound used (permanent image in chart),,    Narrative:  Start time: 02/02/2022 6:50 AM End time: 02/02/2022 7:00 AM Injection made incrementally with aspirations every 5 mL.  Performed by: Personally  Anesthesiologist: Roderic Palau, MD

## 2022-02-02 NOTE — H&P (Signed)
Patient name: Beverly [name]       MRN: [id number]        DOB: 1945/08/27        Sex: female   REASON FOR CONSULT: Evaluate for abdominal exposure for OLIF L3-L5   HPI: Beverly [name] is a 76 y.o. female, with history of hyperlipidemia and CVID that presents for evaluation of abdominal exposure for L3-L5 OLIF.  She has chronic lower back pain including right leg radiculopathy.  She has been evaluated by Dr. Rolena Infante.  Ultimately he feels that her primary pathology is at L3-L4 and L4-L5.  He has asked vascular surgery to assist with abdominal exposure for OLIF at L3-L5.  Patient's previous abdominal surgery includes lap cholecystectomy, two C-sections, hysterectomy, bilateral inguinal hernia repair that was initially done laparoscopic converted to open.       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    right sholder   BCC (basal cell carcinoma of skin) 05/09/2014    mid lower back   BCC (basal cell carcinoma of skin) 05/03/2017    right low back   BCC (basal cell carcinoma of skin) 05/03/2017    left upper back   BCC (basal cell carcinoma of skin) 07/05/2018    left mid back   BCC (basal cell carcinoma of skin) 05/20/1992    upper back   BCC (basal cell carcinoma of skin) 07/29/1993    left sholder medial   BCC (basal cell carcinoma of skin) 07/29/1993    left sholder lateral   BCC (basal cell carcinoma of skin) 07/29/1993    right thigh   BCC (basal cell carcinoma of skin) 07/29/1993    right sholder   BCC (basal cell carcinoma of skin) 12/22/1994    right mid forearm   BCC (basal cell carcinoma of skin) 12/22/1994    right upper forearm   BCC (basal cell carcinoma of skin) 12/22/1994    lower right upper arm    BCC (basal cell carcinoma of skin) 12/22/1994    right upper arm sholder   BCC (basal cell carcinoma of skin) 08/11/1995    left leg below knee   BCC (basal cell carcinoma of skin) 04/11/2002    mid back   BCC (basal cell carcinoma of skin) 12/10/2002    right center upper back   BCC (basal cell carcinoma of skin) 05/26/2005    right post sholder   BCC (basal cell carcinoma) 05/09/2014    left inner shin   BCC (basal cell carcinoma) 06/12/2014    left forearm   Bowen's disease 10/07/1994    right post knee, right inner forearm/wrist   Bowen's disease 08/11/1995    left sholder   Bronchiectasis ([place])     Cataract      left   Chronic pain     Common variable immunodeficiency [facility])      sees dr Gayleen [place] 02-11-2021 epic   Depression      hx of   Diverticulosis of colon 1998    mild   DJD (degenerative joint disease)     Duodenal ulcer  h/o yrs ago   GERD (gastroesophageal reflux disease)     History of SCC (squamous cell carcinoma) of skin      Dr. Denna Haggard   History of sinus bradycardia     HLD (hyperlipidemia)      hypertriglyceridemia   Hypertensive retinopathy of both eyes 11/18/2017   Hypothyroidism     IBS (irritable bowel syndrome)     Internal hemorrhoids 1998   Lymphocytic colitis     Mitral regurgitation      mild   Nocardiosis      relased by infection disease dec 2022   Osteoarthritis      feet,shoulder,neck,back,hips and hands.   Pneumonia 2015   PONV (postoperative nausea and vomiting)     Rotator cuff tear, right 02/2019    infraspinatus and supraspinatus, and dislocation of long head of bicep tendon (Dr. Alvan Dame)   SCC (squamous cell carcinoma) 11/26/2014    left hand, right hand, right deltoid mnay areas   SCC (squamous cell carcinoma) 05/03/2017    left cheek   Sleep apnea      uses a mouth guard nightly   Squamous cell carcinoma in situ (SCCIS) 07/05/2018    left hand   Tracheobronchomalacia     Vitamin D deficiency      mild            Past Surgical History:  Procedure Laterality Date   ABDOMINAL HYSTERECTOMY   1998    complete   BLEPHAROPLASTY Bilateral 01/2018   BROW LIFT Bilateral 10/10/2018    Procedure: BROW LIFT;  Surgeon: Wallace Going, DO;  Location: [place];  Service: Plastics;  Laterality: Bilateral;   BUNIONECTOMY        R 12/08, L 2004 (Dr. Janus Molder)   [place]    bilateral   CARPAL TUNNEL RELEASE Left 03/06/2021    Procedure: Left Revision Carpal Tunnel Release with hypothenar fat pad flap;  Surgeon: [name], MD;  Location: [place];  Service: Orthopedics;  Laterality: Left;  with local anesthesia   CATARACT EXTRACTION Bilateral Left in 08/2012, Right 04/2016    Dr.Hecker   CESAREAN SECTION        1981 and [place]    laparoscopic   CLOSED REDUCTION NASAL FRACTURE N/A 08/29/2018    Procedure: CLOSED REDUCTION NASAL FRACTURE;  Surgeon: Wallace Going, DO;  Location: [place];  Service: Plastics;  Laterality: N/A;  1 hour, please   COLONOSCOPY   2006   COLONOSCOPY   01/2012    due again 01/2021; mild diverticulosis   epidural steroid injection, back   02/2010   HIP SURGERY        right bursectomy x 3   HIP SURGERY Right 2000    torn cartilage, repaired   INGUINAL HERNIA REPAIR   10/2007    bilat   NECK SURGERY   1989    c6-7 cervical laminectomy and diskecotmy   REVERSE SHOULDER ARTHROPLASTY Right 04/11/2019    Procedure: REVERSE SHOULDER ARTHROPLASTY;  Surgeon: Nicholes Stairs, MD;  Location: [place];  Service: Orthopedics;  Laterality: Right;  Regional Block   SHOULDER SURGERY Left 03/2003    left rotator cuff repair   SHOULDER SURGERY Left 03/14/2018    rotator cuff repair; Dr. Tonita Cong   TONSILLECTOMY   age 65   [place]  TOOTH EXTRACTION        as teenager           Family History  Problem Relation Age of Onset    Depression Mother     Schizophrenia Mother     Cancer Father          oral   Heart disease Father          bradycardia   Esophageal cancer Father     Cancer Sister          skin and lung   COPD Sister     Hypertension Sister     Osteoporosis Sister          compression fx's x 3 11/2018   Hyperthyroidism Brother     Cancer Brother          metastatic cancer to bone (?primary)   Diabetes Maternal Grandmother     Cancer Paternal Grandmother 70        colon cancer, metastatic to liver   Colon cancer Paternal Grandmother     Liver cancer Paternal Grandmother     Hyperlipidemia Daughter     Asthma Son     ADD / ADHD Other        SOCIAL HISTORY: Social History         Socioeconomic History   Marital status: Divorced      Spouse name: Not on file   Number of children: 2   Years of education: Not on file   Highest education level: Not on file  Occupational History   Occupation: STAFF NURSE      Employer: [employer]  Tobacco Use   Smoking status: Never   Smokeless tobacco: Never  Vaping Use   Vaping Use: Never used  Substance and Sexual Activity   Alcohol use: Yes      Alcohol/week: 7.0 standard drinks      Types: 7 Glasses of wine per week      Comment: 1 glass wine per day   Drug use: No   Sexual activity: Not Currently  Other Topics Concern   Not on file  Social History Narrative    Divorced, lives alone, [place]; 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    [place] for [employer] part-time    Social Determinants of Health       Financial Resource Strain: Not on file  Food Insecurity: Not on file  Transportation Needs: Not on file  Physical Activity: Not on file  Stress: Not on file  Social Connections: Not on file  Intimate Partner Violence: Not At Risk   Fear of Current or Ex-Partner: No   Emotionally Abused: No    Physically Abused: No   Sexually Abused: No           Allergies  Allergen Reactions   Cat Hair Extract        Other reaction(s): Wheezing   Short Ragweed Pollen Ext     Adhesive [Tape] Rash   Codeine Rash          REVIEW OF SYSTEMS:  '[X]'$  denotes positive finding, '[ ]'$  denotes negative finding Cardiac   Comments:  Chest pain or chest pressure:      Shortness of breath upon exertion:      Short of breath when lying flat:      Irregular heart rhythm:  Vascular      Pain in calf, thigh, or hip brought on by ambulation:      Pain in feet at night that wakes you up from your sleep:       Blood clot in your veins:      Leg swelling:              Pulmonary      Oxygen at home:      Productive cough:       Wheezing:              Neurologic      Sudden weakness in arms or legs:       Sudden numbness in arms or legs:       Sudden onset of difficulty speaking or slurred speech:      Temporary loss of vision in one eye:       Problems with dizziness:              Gastrointestinal      Blood in stool:       Vomited blood:              Genitourinary      Burning when urinating:       Blood in urine:             Psychiatric      Major depression:              Hematologic      Bleeding problems:      Problems with blood clotting too easily:             Skin      Rashes or ulcers:             Constitutional      Fever or chills:          PHYSICAL EXAM:    Vitals:    05/06/21 1544  BP: 112/64  Pulse: (!) 57  Resp: 16  Temp: (!) 97.2 F (36.2 C)  TempSrc: Temporal  SpO2: 95%  Weight: 155 lb (70.3 kg)  Height: '5\' 4"'$  (1.626 m)      GENERAL: The patient is a well-nourished female, in no acute distress. The vital signs are documented above. CARDIAC: There is a regular rate and rhythm.  VASCULAR:  Bilateral femoral pulses palpable Right AT palpable Left DP palpable PULMONARY: No respiratory distress. ABDOMEN: Soft and non-tender.  Lower  abdominal transverse incision. MUSCULOSKELETAL: There are no major deformities or cyanosis. NEUROLOGIC: No focal weakness or paresthesias are detected. SKIN: There are no ulcers or rashes noted. PSYCHIATRIC: The patient has a normal affect.   DATA:    MRI reviewed from 02/18/21        Assessment/Plan:   76 year old female with chronic lower back pain and right leg radiculopathy that presents for evaluation for abdominal exposure for L3-L5 OLIF.  After review of the patient's MRI and examining her abdomen, I think she would be a good candidate for oblique approach.  I discussed having her in the right lateral position and then making an oblique incision over the left oblique muscles at the L3-L4 and L4-L5 disc space.  Discussed performing muscle-sparing technique to enter the retroperitoneum and then mobilizing peritoneum and left ureter away from the disc base including possibly mobilizing the iliac artery and vein and/or aorta as well as the left psoas to expose the disc space.  Discussed risk of injury to the above structures.  Look forward to assisting Dr. Rolena Infante.  All questions answered.       Marty Heck, MD [facility] Office: [phone]

## 2022-02-02 NOTE — Anesthesia Postprocedure Evaluation (Signed)
Anesthesia Post Note  Patient: Beverly [name]  Procedure(s) Performed: OBLIQUE LUMBAR INTERBODY FUSION LUMBAR THREE TO FIVE (Spine Lumbar) ABDOMINAL EXPOSURE (Spine Lumbar)     Patient location during evaluation: PACU Anesthesia Type: General Level of consciousness: awake and alert Pain management: pain level controlled Vital Signs Assessment: post-procedure vital signs reviewed and stable Respiratory status: spontaneous breathing, nonlabored ventilation and respiratory function stable Cardiovascular status: blood pressure returned to baseline and stable Postop Assessment: no apparent nausea or vomiting Anesthetic complications: no  No notable events documented.  Last Vitals:  Vitals:   02/02/22 1245 02/02/22 1300  BP: (!) 156/68 (!) 160/72  Pulse: 60 65  Resp: 18 15  Temp:    SpO2: 96% 97%    Last Pain:  Vitals:   02/02/22 1245  TempSrc:   PainSc: 7                  Edison Nicholson,W. EDMOND

## 2022-02-02 NOTE — Anesthesia Procedure Notes (Signed)
Arterial Line Insertion Start/End12/18/2023 7:11 AM, 02/02/2022 7:13 AM Performed by: Heide Scales, CRNA, CRNA  Patient location: Pre-op. Preanesthetic checklist: patient identified, IV checked, site marked, risks and benefits discussed, surgical consent, monitors and equipment checked, pre-op evaluation, timeout performed and anesthesia consent Lidocaine 1% used for infiltration Right, radial was placed Catheter size: 20 G Hand hygiene performed , maximum sterile barriers used  and Seldinger technique used Allen's test indicative of satisfactory collateral circulation Attempts: 1 Procedure performed without using ultrasound guided technique. Following insertion, dressing applied. Post procedure assessment: normal and unchanged  Patient tolerated the procedure well with no immediate complications.

## 2022-02-02 NOTE — Anesthesia Preprocedure Evaluation (Signed)
Anesthesia Evaluation  Patient identified by MRN, date of birth, ID band Patient awake    Reviewed: Allergy & Precautions, H&P , NPO status , Patient's Chart, lab work & pertinent test results  History of Anesthesia Complications (+) PONV and history of anesthetic complications  Airway Mallampati: II  TM Distance: >3 FB Neck ROM: Full    Dental no notable dental hx. (+) Teeth Intact, Dental Advisory Given   Pulmonary shortness of breath and with exertion, asthma , sleep apnea , pneumonia, resolved Bronchiectasis Interstitial lung disease   Pulmonary exam normal breath sounds clear to auscultation       Cardiovascular Exercise Tolerance: Good + CAD and + Cardiac Stents  + Valvular Problems/Murmurs MR  Rhythm:Regular Rate:Normal     Neuro/Psych  PSYCHIATRIC DISORDERS  Depression     Neuromuscular disease    GI/Hepatic Neg liver ROS, PUD,GERD  Medicated,,Hx/o lymphocitic colitis IBS   Endo/Other  Hypothyroidism    Renal/GU negative Renal ROS  negative genitourinary   Musculoskeletal  (+) Arthritis , Osteoarthritis,  Spondylolisthesis L4-5 Scoliosis lumbar spine   Abdominal   Peds  Hematology  (+) Blood dyscrasia, anemia   Anesthesia Other Findings   Reproductive/Obstetrics negative OB ROS                             Anesthesia Physical Anesthesia Plan  ASA: 3  Anesthesia Plan: General   Post-op Pain Management: Ofirmev IV (intra-op)*, Ketamine IV* and Tylenol PO (pre-op)*   Induction: Intravenous  PONV Risk Score and Plan: 4 or greater and Ondansetron, Dexamethasone, Midazolam and Treatment may vary due to age or medical condition  Airway Management Planned: Oral ETT  Additional Equipment: Arterial line  Intra-op Plan:   Post-operative Plan: Extubation in OR  Informed Consent: I have reviewed the patients History and Physical, chart, labs and discussed the procedure  including the risks, benefits and alternatives for the proposed anesthesia with the patient or authorized representative who has indicated his/her understanding and acceptance.     Dental advisory given  Plan Discussed with: CRNA and Anesthesiologist  Anesthesia Plan Comments: (  )       Anesthesia Quick Evaluation

## 2022-02-03 ENCOUNTER — Inpatient Hospital Stay (HOSPITAL_COMMUNITY): Payer: Medicare PPO

## 2022-02-03 ENCOUNTER — Encounter (HOSPITAL_COMMUNITY)
Admission: RE | Disposition: A | Discharge status: Home Health | Payer: Self-pay | Source: Home / Self Care | Attending: Orthopedic Surgery

## 2022-02-03 ENCOUNTER — Inpatient Hospital Stay (HOSPITAL_COMMUNITY): Admission: RE | Admit: 2022-02-03 | Payer: Medicare PPO | Source: Ambulatory Visit | Admitting: Orthopedic Surgery

## 2022-02-03 ENCOUNTER — Inpatient Hospital Stay (HOSPITAL_COMMUNITY): Payer: Medicare PPO | Admitting: Certified Registered Nurse Anesthetist

## 2022-02-03 ENCOUNTER — Ambulatory Visit: Payer: Medicare PPO | Admitting: Allergy and Immunology

## 2022-02-03 ENCOUNTER — Encounter (HOSPITAL_COMMUNITY): Payer: Self-pay | Admitting: Orthopedic Surgery

## 2022-02-03 DIAGNOSIS — D638 Anemia in other chronic diseases classified elsewhere: Secondary | ICD-10-CM

## 2022-02-03 DIAGNOSIS — E039 Hypothyroidism, unspecified: Secondary | ICD-10-CM

## 2022-02-03 DIAGNOSIS — M4156 Other secondary scoliosis, lumbar region: Secondary | ICD-10-CM

## 2022-02-03 DIAGNOSIS — M48061 Spinal stenosis, lumbar region without neurogenic claudication: Secondary | ICD-10-CM

## 2022-02-03 DIAGNOSIS — M4316 Spondylolisthesis, lumbar region: Secondary | ICD-10-CM

## 2022-02-03 SURGERY — POSTERIOR LUMBAR FUSION 2 LEVEL
Anesthesia: General | Site: Spine Lumbar

## 2022-02-03 MED ORDER — DEXAMETHASONE SODIUM PHOSPHATE 10 MG/ML IJ SOLN
INTRAMUSCULAR | Status: AC
  Filled 2022-02-03: qty 1

## 2022-02-03 MED ORDER — KETAMINE HCL 10 MG/ML IJ SOLN
INTRAMUSCULAR | Status: DC | PRN
  Administered 2022-02-03: 50 mg via INTRAVENOUS

## 2022-02-03 MED ORDER — HYDROMORPHONE HCL 1 MG/ML IJ SOLN
0.2500 mg | INTRAMUSCULAR | Status: DC | PRN
  Administered 2022-02-03 (×2): 0.5 mg via INTRAVENOUS

## 2022-02-03 MED ORDER — HYDROMORPHONE HCL 1 MG/ML IJ SOLN
INTRAMUSCULAR | Status: AC
  Filled 2022-02-03: qty 0.5

## 2022-02-03 MED ORDER — OXYCODONE HCL 5 MG PO TABS: 5.0000 mg | ORAL_TABLET | Freq: Once | ORAL | Status: DC | PRN

## 2022-02-03 MED ORDER — LIDOCAINE 2% (20 MG/ML) 5 ML SYRINGE
INTRAMUSCULAR | Status: AC
  Filled 2022-02-03: qty 5

## 2022-02-03 MED ORDER — SURGIFLO WITH THROMBIN (HEMOSTATIC MATRIX KIT) OPTIME
TOPICAL | Status: DC | PRN
  Administered 2022-02-03 (×2): 1 via TOPICAL

## 2022-02-03 MED ORDER — GELATIN ABSORBABLE 100 EX MISC: CUTANEOUS | Status: DC | PRN

## 2022-02-03 MED ORDER — PHENYLEPHRINE HCL (PRESSORS) 10 MG/ML IV SOLN
INTRAVENOUS | Status: DC | PRN
  Administered 2022-02-03 (×6): 80 ug via INTRAVENOUS

## 2022-02-03 MED ORDER — HYDROMORPHONE HCL 1 MG/ML IJ SOLN
INTRAMUSCULAR | Status: AC
  Filled 2022-02-03: qty 1

## 2022-02-03 MED ORDER — VANCOMYCIN HCL 1000 MG IV SOLR
INTRAVENOUS | Status: DC | PRN
  Administered 2022-02-03: 1000 mg

## 2022-02-03 MED ORDER — DEXAMETHASONE SODIUM PHOSPHATE 10 MG/ML IJ SOLN
INTRAMUSCULAR | Status: DC | PRN
  Administered 2022-02-03: 10 mg via INTRAVENOUS

## 2022-02-03 MED ORDER — PAROXETINE HCL 20 MG PO TABS
40.0000 mg | ORAL_TABLET | Freq: Every day | ORAL | Status: DC
  Administered 2022-02-03: 40 mg via ORAL
  Filled 2022-02-03 (×2): qty 2

## 2022-02-03 MED ORDER — CEFAZOLIN SODIUM-DEXTROSE 2-3 GM-%(50ML) IV SOLR
INTRAVENOUS | Status: DC | PRN
  Administered 2022-02-03: 2 g via INTRAVENOUS

## 2022-02-03 MED ORDER — PANTOPRAZOLE SODIUM 40 MG PO TBEC
40.0000 mg | DELAYED_RELEASE_TABLET | Freq: Every day | ORAL | Status: DC
  Administered 2022-02-03 – 2022-02-04 (×2): 40 mg via ORAL
  Filled 2022-02-03 (×2): qty 1

## 2022-02-03 MED ORDER — PHENYLEPHRINE HCL-NACL 20-0.9 MG/250ML-% IV SOLN
INTRAVENOUS | Status: DC | PRN
  Administered 2022-02-03: 20 ug/min via INTRAVENOUS

## 2022-02-03 MED ORDER — OXYCODONE HCL 5 MG/5ML PO SOLN: 5.0000 mg | Freq: Once | ORAL | Status: DC | PRN

## 2022-02-03 MED ORDER — PHENYLEPHRINE 80 MCG/ML (10ML) SYRINGE FOR IV PUSH (FOR BLOOD PRESSURE SUPPORT)
PREFILLED_SYRINGE | INTRAVENOUS | Status: AC
  Filled 2022-02-03: qty 10

## 2022-02-03 MED ORDER — ONDANSETRON HCL 4 MG/2ML IJ SOLN
INTRAMUSCULAR | Status: DC | PRN
  Administered 2022-02-03: 4 mg via INTRAVENOUS

## 2022-02-03 MED ORDER — OXYCODONE-ACETAMINOPHEN 10-325 MG PO TABS: 1.0000 | ORAL_TABLET | Freq: Four times a day (QID) | ORAL | 0 refills | Status: AC | PRN

## 2022-02-03 MED ORDER — ACETAMINOPHEN 10 MG/ML IV SOLN
INTRAVENOUS | Status: AC
  Filled 2022-02-03: qty 100

## 2022-02-03 MED ORDER — NITROGLYCERIN 0.4 MG SL SUBL: 0.4000 mg | SUBLINGUAL_TABLET | SUBLINGUAL | Status: DC | PRN

## 2022-02-03 MED ORDER — THROMBIN 20000 UNITS EX SOLR
CUTANEOUS | Status: AC
  Filled 2022-02-03: qty 20000

## 2022-02-03 MED ORDER — BSS IO SOLN
INTRAOCULAR | Status: AC
  Filled 2022-02-03: qty 15

## 2022-02-03 MED ORDER — CEFAZOLIN SODIUM-DEXTROSE 2-4 GM/100ML-% IV SOLN
INTRAVENOUS | Status: AC
  Filled 2022-02-03: qty 100

## 2022-02-03 MED ORDER — LACTATED RINGERS IV SOLN: INTRAVENOUS | Status: DC | PRN

## 2022-02-03 MED ORDER — KETOROLAC TROMETHAMINE 0.5 % OP SOLN
OPHTHALMIC | Status: AC
  Filled 2022-02-03: qty 5

## 2022-02-03 MED ORDER — LIDOCAINE 2% (20 MG/ML) 5 ML SYRINGE
INTRAMUSCULAR | Status: DC | PRN
  Administered 2022-02-03: 60 mg via INTRAVENOUS

## 2022-02-03 MED ORDER — 0.9 % SODIUM CHLORIDE (POUR BTL) OPTIME
TOPICAL | Status: DC | PRN
  Administered 2022-02-03 (×3): 1000 mL

## 2022-02-03 MED ORDER — AMISULPRIDE (ANTIEMETIC) 5 MG/2ML IV SOLN: 10.0000 mg | Freq: Once | INTRAVENOUS | Status: DC | PRN

## 2022-02-03 MED ORDER — ROCURONIUM BROMIDE 10 MG/ML (PF) SYRINGE
PREFILLED_SYRINGE | INTRAVENOUS | Status: DC | PRN
  Administered 2022-02-03: 10 mg via INTRAVENOUS

## 2022-02-03 MED ORDER — HYDROMORPHONE HCL 1 MG/ML IJ SOLN
INTRAMUSCULAR | Status: DC | PRN
  Administered 2022-02-03: .1 mg via INTRAVENOUS
  Administered 2022-02-03: .5 mg via INTRAVENOUS
  Administered 2022-02-03 (×4): .1 mg via INTRAVENOUS

## 2022-02-03 MED ORDER — ROCURONIUM BROMIDE 10 MG/ML (PF) SYRINGE
PREFILLED_SYRINGE | INTRAVENOUS | Status: AC
  Filled 2022-02-03: qty 10

## 2022-02-03 MED ORDER — MAGNESIUM CITRATE PO SOLN: 1.0000 | Freq: Once | ORAL | Status: DC | PRN

## 2022-02-03 MED ORDER — KETOROLAC TROMETHAMINE 0.5 % OP SOLN
1.0000 [drp] | Freq: Four times a day (QID) | OPHTHALMIC | Status: DC
  Administered 2022-02-03 (×3): 1 [drp] via OPHTHALMIC
  Filled 2022-02-03: qty 5

## 2022-02-03 MED ORDER — CEFAZOLIN SODIUM-DEXTROSE 1-4 GM/50ML-% IV SOLN
1.0000 g | Freq: Three times a day (TID) | INTRAVENOUS | Status: AC
  Administered 2022-02-03 (×2): 1 g via INTRAVENOUS
  Filled 2022-02-03 (×2): qty 50

## 2022-02-03 MED ORDER — FENTANYL CITRATE (PF) 250 MCG/5ML IJ SOLN
INTRAMUSCULAR | Status: DC | PRN
  Administered 2022-02-03: 50 ug via INTRAVENOUS
  Administered 2022-02-03: 100 ug via INTRAVENOUS
  Administered 2022-02-03 (×2): 25 ug via INTRAVENOUS
  Administered 2022-02-03: 50 ug via INTRAVENOUS

## 2022-02-03 MED ORDER — SUCCINYLCHOLINE CHLORIDE 200 MG/10ML IV SOSY
PREFILLED_SYRINGE | INTRAVENOUS | Status: AC
  Filled 2022-02-03: qty 10

## 2022-02-03 MED ORDER — PROPOFOL 10 MG/ML IV BOLUS
INTRAVENOUS | Status: DC | PRN
  Administered 2022-02-03: 50 mg via INTRAVENOUS
  Administered 2022-02-03: 120 mg via INTRAVENOUS

## 2022-02-03 MED ORDER — ACETAMINOPHEN 10 MG/ML IV SOLN
INTRAVENOUS | Status: DC | PRN
  Administered 2022-02-03: 1000 mg via INTRAVENOUS

## 2022-02-03 MED ORDER — METHOCARBAMOL 500 MG PO TABS: 500.0000 mg | ORAL_TABLET | Freq: Three times a day (TID) | ORAL | 0 refills | Status: AC | PRN

## 2022-02-03 MED ORDER — ONDANSETRON HCL 4 MG PO TABS: 4.0000 mg | ORAL_TABLET | Freq: Three times a day (TID) | ORAL | 0 refills | Status: DC | PRN

## 2022-02-03 MED ORDER — ONDANSETRON HCL 4 MG/2ML IJ SOLN: 4.0000 mg | Freq: Once | INTRAMUSCULAR | Status: DC | PRN

## 2022-02-03 MED ORDER — PROPOFOL 1000 MG/100ML IV EMUL
INTRAVENOUS | Status: AC
  Filled 2022-02-03: qty 300

## 2022-02-03 MED ORDER — ONDANSETRON HCL 4 MG/2ML IJ SOLN
INTRAMUSCULAR | Status: AC
  Filled 2022-02-03: qty 2

## 2022-02-03 MED ORDER — KETAMINE HCL 50 MG/5ML IJ SOSY
PREFILLED_SYRINGE | INTRAMUSCULAR | Status: AC
  Filled 2022-02-03: qty 5

## 2022-02-03 MED ORDER — THROMBIN 20000 UNITS EX SOLR
CUTANEOUS | Status: DC | PRN
  Administered 2022-02-03: 20 mL via TOPICAL

## 2022-02-03 MED ORDER — BSS IO SOLN
15.0000 mL | Freq: Once | INTRAOCULAR | Status: AC
  Administered 2022-02-03: 15 mL
  Filled 2022-02-03: qty 15

## 2022-02-03 MED ORDER — BUPIVACAINE-EPINEPHRINE 0.25% -1:200000 IJ SOLN
INTRAMUSCULAR | Status: DC | PRN
  Administered 2022-02-03: 10 mL

## 2022-02-03 MED ORDER — VANCOMYCIN HCL 1000 MG IV SOLR
INTRAVENOUS | Status: AC
  Filled 2022-02-03: qty 20

## 2022-02-03 MED ORDER — PROPOFOL 500 MG/50ML IV EMUL
INTRAVENOUS | Status: DC | PRN
  Administered 2022-02-03: 75 ug/kg/min via INTRAVENOUS

## 2022-02-03 MED ORDER — FENTANYL CITRATE (PF) 250 MCG/5ML IJ SOLN
INTRAMUSCULAR | Status: AC
  Filled 2022-02-03: qty 5

## 2022-02-03 MED ORDER — BUPIVACAINE-EPINEPHRINE (PF) 0.25% -1:200000 IJ SOLN
INTRAMUSCULAR | Status: AC
  Filled 2022-02-03: qty 30

## 2022-02-03 MED ORDER — TRANEXAMIC ACID-NACL 1000-0.7 MG/100ML-% IV SOLN
INTRAVENOUS | Status: DC | PRN
  Administered 2022-02-03: 1000 mg via INTRAVENOUS

## 2022-02-03 MED ORDER — GABAPENTIN 300 MG PO CAPS
300.0000 mg | ORAL_CAPSULE | Freq: Three times a day (TID) | ORAL | Status: DC
  Administered 2022-02-03 – 2022-02-04 (×2): 300 mg via ORAL
  Filled 2022-02-03 (×2): qty 1

## 2022-02-03 MED ORDER — TRANEXAMIC ACID-NACL 1000-0.7 MG/100ML-% IV SOLN
INTRAVENOUS | Status: AC
  Filled 2022-02-03: qty 100

## 2022-02-03 MED ORDER — LEVOTHYROXINE SODIUM 25 MCG PO TABS
25.0000 ug | ORAL_TABLET | Freq: Every day | ORAL | Status: DC
  Administered 2022-02-03 – 2022-02-04 (×2): 25 ug via ORAL
  Filled 2022-02-03 (×2): qty 1

## 2022-02-03 MED ORDER — CLOPIDOGREL BISULFATE 75 MG PO TABS
75.0000 mg | ORAL_TABLET | Freq: Every day | ORAL | Status: DC
  Administered 2022-02-04: 75 mg via ORAL
  Filled 2022-02-03: qty 1

## 2022-02-03 MED ORDER — PROPOFOL 10 MG/ML IV BOLUS
INTRAVENOUS | Status: AC
  Filled 2022-02-03: qty 20

## 2022-02-03 MED ORDER — SUCCINYLCHOLINE CHLORIDE 200 MG/10ML IV SOSY
PREFILLED_SYRINGE | INTRAVENOUS | Status: DC | PRN
  Administered 2022-02-03: 100 mg via INTRAVENOUS

## 2022-02-03 SURGICAL SUPPLY — 73 items
BAG COUNTER SPONGE SURGICOUNT (BAG) ×1 IMPLANT
BLADE BONE MILL FINE (MISCELLANEOUS) ×1
BLADE CLIPPER SURG (BLADE) IMPLANT
BLADE MILL BN FN STRL DISP (MISCELLANEOUS) IMPLANT
BUR EGG ELITE 4.0 (BURR) IMPLANT
BUR MATCHSTICK NEURO 3.0 LAGG (BURR) IMPLANT
CABLE BIPOLOR RESECTION CORD (MISCELLANEOUS) ×1 IMPLANT
CAP RELINE MOD TULIP RMM (Cap) IMPLANT
CLIP NEUROVISION LG (CLIP) IMPLANT
CLSR STERI-STRIP ANTIMIC 1/2X4 (GAUZE/BANDAGES/DRESSINGS) ×1 IMPLANT
COVER MAYO STAND STRL (DRAPES) IMPLANT
COVER SURGICAL LIGHT HANDLE (MISCELLANEOUS) ×1 IMPLANT
DRAIN CHANNEL 15F RND FF W/TCR (WOUND CARE) IMPLANT
DRAPE C-ARM 42X72 X-RAY (DRAPES) ×1 IMPLANT
DRAPE C-ARMOR (DRAPES) ×1 IMPLANT
DRAPE INCISE IOBAN 66X45 STRL (DRAPES) IMPLANT
DRAPE POUCH INSTRU U-SHP 10X18 (DRAPES) ×1 IMPLANT
DRAPE SURG 17X23 STRL (DRAPES) ×1 IMPLANT
DRAPE U-SHAPE 47X51 STRL (DRAPES) ×1 IMPLANT
DRSG OPSITE POSTOP 4X6 (GAUZE/BANDAGES/DRESSINGS) IMPLANT
DRSG OPSITE POSTOP 4X8 (GAUZE/BANDAGES/DRESSINGS) ×1 IMPLANT
DURAPREP 26ML APPLICATOR (WOUND CARE) ×1 IMPLANT
ELECT BLADE 4.0 EZ CLEAN MEGAD (MISCELLANEOUS)
ELECT BLADE 6.5 EXT (BLADE) ×1 IMPLANT
ELECT CAUTERY BLADE 6.4 (BLADE) ×1 IMPLANT
ELECT PENCIL ROCKER SW 15FT (MISCELLANEOUS) ×1 IMPLANT
ELECT REM PT RETURN 9FT ADLT (ELECTROSURGICAL) ×1
ELECTRODE BLDE 4.0 EZ CLN MEGD (MISCELLANEOUS) IMPLANT
ELECTRODE REM PT RTRN 9FT ADLT (ELECTROSURGICAL) ×1 IMPLANT
GLOVE BIOGEL PI IND STRL 8.5 (GLOVE) ×1 IMPLANT
GLOVE SS BIOGEL STRL SZ 8.5 (GLOVE) ×1 IMPLANT
GOWN STRL REUS W/ TWL LRG LVL3 (GOWN DISPOSABLE) ×1 IMPLANT
GOWN STRL REUS W/TWL 2XL LVL3 (GOWN DISPOSABLE) ×2 IMPLANT
GOWN STRL REUS W/TWL LRG LVL3 (GOWN DISPOSABLE) ×1
KIT BASIN OR (CUSTOM PROCEDURE TRAY) ×1 IMPLANT
KIT POSITION SURG JACKSON T1 (MISCELLANEOUS) ×1 IMPLANT
KIT TURNOVER KIT B (KITS) ×1 IMPLANT
MODULE EMG NDL SSEP NVM5 (NEEDLE) IMPLANT
MODULE EMG NEEDLE SSEP NVM5 (NEEDLE) ×1 IMPLANT
MODULE NVM5 NEXT GEN EMG (NEEDLE) IMPLANT
NDL 22X1.5 STRL (OR ONLY) (MISCELLANEOUS) ×1 IMPLANT
NDL SPNL 18GX3.5 QUINCKE PK (NEEDLE) ×1 IMPLANT
NEEDLE 22X1.5 STRL (OR ONLY) (MISCELLANEOUS) ×1 IMPLANT
NEEDLE SPNL 18GX3.5 QUINCKE PK (NEEDLE) ×1 IMPLANT
NS IRRIG 1000ML POUR BTL (IV SOLUTION) ×1 IMPLANT
PACK LAMINECTOMY ORTHO (CUSTOM PROCEDURE TRAY) ×1 IMPLANT
PACK UNIVERSAL I (CUSTOM PROCEDURE TRAY) ×1 IMPLANT
PAD ARMBOARD 7.5X6 YLW CONV (MISCELLANEOUS) ×2 IMPLANT
PATTIES SURGICAL .5 X.5 (GAUZE/BANDAGES/DRESSINGS) IMPLANT
POSITIONER HEAD PRONE TRACH (MISCELLANEOUS) ×1 IMPLANT
PROBE BALL TIP NVM5 SNG USE (BALLOONS) IMPLANT
ROD RELINE COCR LORD 5.0X65 (Rod) IMPLANT
SCREW LOCK RSS 4.5/5.0MM (Screw) ×4 IMPLANT
SCREW SHANK RELINE 6.5X40MM (Screw) IMPLANT
SHANK RELINE MOD 5.5X40 (Screw) IMPLANT
SPONGE SURGIFOAM ABS GEL 100 (HEMOSTASIS) IMPLANT
SPONGE T-LAP 4X18 ~~LOC~~+RFID (SPONGE) ×2 IMPLANT
SURGIFLO W/THROMBIN 8M KIT (HEMOSTASIS) IMPLANT
SUT BONE WAX W31G (SUTURE) ×1 IMPLANT
SUT MNCRL AB 3-0 PS2 18 (SUTURE) ×2 IMPLANT
SUT VIC AB 1 CT1 18XCR BRD 8 (SUTURE) ×1 IMPLANT
SUT VIC AB 1 CT1 27 (SUTURE) ×1
SUT VIC AB 1 CT1 27XBRD ANTBC (SUTURE) IMPLANT
SUT VIC AB 1 CT1 8-18 (SUTURE) ×1
SUT VIC AB 1 CTX 36 (SUTURE)
SUT VIC AB 1 CTX36XBRD ANBCTR (SUTURE) IMPLANT
SUT VIC AB 2-0 CT1 18 (SUTURE) ×1 IMPLANT
SYR BULB IRRIG 60ML STRL (SYRINGE) ×1 IMPLANT
SYR CONTROL 10ML LL (SYRINGE) ×1 IMPLANT
TOWEL GREEN STERILE (TOWEL DISPOSABLE) ×1 IMPLANT
TOWEL GREEN STERILE FF (TOWEL DISPOSABLE) ×1 IMPLANT
WATER STERILE IRR 1000ML POUR (IV SOLUTION) ×1 IMPLANT
YANKAUER SUCT BULB TIP NO VENT (SUCTIONS) ×1 IMPLANT

## 2022-02-03 NOTE — Discharge Instructions (Signed)
  Spinal Fusion, Adult, Care After This sheet gives you information about how to care for yourself after your procedure. Your doctor may also give you more specific instructions. If you have problems or questions, contact your doctor. Follow these instructions at home: Medicines Take over-the-counter and prescription medicines only as told by your doctor. These include any medicines for pain or blood-thinning medicines (anticoagulants). If you were prescribed an antibiotic medicine, take it as told by your doctor. Do not stop taking the antibiotic even if you start to feel better. Do not drive for 24 hours if you were given a medicine to help you relax (sedative) during your procedure. Do not drive or use heavy machinery while taking prescription pain medicine. If you have a brace: Wear the brace as told by your doctor. Take it off only as told by your doctor. Keep the brace clean. Managing pain, stiffness, and swelling If directed, put ice on the surgery area: If you have a removable brace, take it off as told by your doctor. Put ice in a plastic bag. Place a towel between your skin and the bag. Leave the ice on for 20 minutes, 2-3 times a day. Surgery cut care    Follow instructions from your doctor about how to take care of your cut from surgery (incision). Make sure you: Wash your hands with soap and water before you change your bandage (dressing). If you cannot use soap and water, use hand sanitizer. Change your bandage as told by your doctor. Leave stitches (sutures), skin glue, or skin tape (adhesive) strips in place. They may need to stay in place for 2 weeks or longer. If tape strips get loose and curl up, you may trim the loose edges. Do not remove tape strips completely unless your doctor says it is okay. Keep your cut from surgery clean and dry. Do not take baths, swim, or use a hot tub until your doctor says it is okay. Ask your doctor if you can take showers. You may only  be allowed to take sponge baths. Every day, check your cut from surgery and the area around it for: More redness, swelling, or pain. Fluid or blood. Warmth. Pus or a bad smell. If you have a drain tube, follow instructions from your doctor about caring for it. Do not take out the drain tube or any bandages unless your doctor says it is okay. Physical activity Rest and protect your back as much as possible. Follow instructions from your doctor about how to move. Use good posture to help your spine heal. Do not lift anything that is heavier than 8 lb (3.6 kg), or the limit that you are told, until your doctor says that it is safe. Do not twist or bend at the waist until your doctor says it is okay. It is best if you: Do not make pushing and pulling motions. Do not sit or lie down in the same position for a long time. Do not raise your hands or arms above your head. Return to your normal activities as told by your doctor. Ask your doctor what activities are safe for you. Rest and protect your back as much as you can. Do not start to exercise until your doctor says it is okay. Ask your doctor what kinds of exercise you can do to make your back stronger. Ok to shower in 5 days.  Do not take a bath or submerge the wound General instructions To prevent blood clots and lessen swelling   in your legs: Wear compression stockings as told. Walk one or more times every few hours as told by your doctor. Do not use any products that contain nicotine or tobacco, such as cigarettes and e-cigarettes. These can delay bone healing. If you need help quitting, ask your doctor. To prevent or treat constipation while you are taking prescription pain medicine, your doctor may suggest that you: Drink enough fluid to keep your pee (urine) pale yellow. Take over-the-counter or prescription medicines. Eat foods that are high in fiber. These include fresh fruits and vegetables, whole grains, and beans. Limit foods that  are high in fat and processed sugars, such as fried and sweet foods. Keep all follow-up visits as told by your doctor. This is important. Contact a doctor if: Your pain gets worse. Your medicine does not help your pain. Your legs or feet get painful or swollen. Your cut from surgery is more red, swollen, or painful. Your cut from surgery feels warm to the touch. You have: Fluid or blood coming from your cut from surgery. Pus or a bad smell coming from your cut from surgery. A fever. Weakness or loss of feeling (numbness) in your legs that is new or getting worse. Trouble controlling when you pee (urinate) or poop (have a bowel movement). You feel sick to your stomach (nauseous). You throw up (vomit). Get help right away if: Your pain is very bad. You have chest pain. You have trouble breathing. You start to have a cough. These symptoms may be an emergency. Do not wait to see if the symptoms will go away. Get medical help right away. Call your local emergency services (911 in the [place] Do not drive yourself to the hospital. Summary After the procedure, it is common to have pain in your back and pain by your surgery cut(s). Icing and pain medicines may help to control the pain. Follow directions from your doctor. Rest and protect your back as much as possible. Do not twist or bend at the waist. Get up and walk one or more times every few hours as told by your doctor. This information is not intended to replace advice given to you by your health care provider. Make sure you discuss any questions you have with your health care provider.  -signs and symptoms of a blood clot such as chest pain; shortness of breath; pain, swelling, or warmth in the leg -signs and symptoms of a stroke such as changes in vision; confusion; trouble speaking or understanding; severe headaches; sudden numbness or weakness of the face, arm or leg; trouble walking; dizziness; loss of coordination Side effects that  usually do not require medical attention (report to your doctor or health care professional if they continue or are bothersome): -hair loss -pain, redness, or irritation at site where injected This list may not describe all possible side effects. Call your doctor for medical advice about side effects. You may report side effects to FDA at 1-800-FDA-1088. Where should I keep my medicine? Keep out of the reach of children. Store at room temperature between 15 and 30 degrees C (59 and 86 degrees F). Do not freeze. If your injections have been specially prepared, you may need to store them in the refrigerator. Ask your pharmacist. Throw away any unused medicine after the expiration date. NOTE: This sheet is a summary. It may not cover all possible information. If you have questions about this medicine, talk to your doctor, pharmacist, or health care provider.     

## 2022-02-03 NOTE — Progress Notes (Signed)
OT Cancellation Note  Patient Details Name: Beverly [name] MRN: [id number] DOB: 1945/03/25   Cancelled Treatment:    Reason Eval/Treat Not Completed: Patient at procedure or test/ unavailable. Pt in surgery.  [place], OTR/L [facility] [phone] Office [phone]    Almon Register 02/03/2022, 6:40 AM

## 2022-02-03 NOTE — Addendum Note (Signed)
Addendum  created 02/03/22 1345 by Josephine Igo, MD   Clinical Note Signed

## 2022-02-03 NOTE — Anesthesia Procedure Notes (Signed)
Procedure Name: Intubation Date/Time: 02/03/2022 7:59 AM  Performed by: Maude Leriche, CRNAPre-anesthesia Checklist: Patient identified, Emergency Drugs available, Suction available and Patient being monitored Patient Re-evaluated:Patient Re-evaluated prior to induction Oxygen Delivery Method: Circle system utilized Preoxygenation: Pre-oxygenation with 100% oxygen Induction Type: IV induction Ventilation: Mask ventilation without difficulty Laryngoscope Size: Glidescope and 3 Grade View: Grade I Tube type: Oral Tube size: 7.0 mm Number of attempts: 2 Airway Equipment and Method: Stylet, Video-laryngoscopy and Bite block Placement Confirmation: ETT inserted through vocal cords under direct vision, positive ETCO2 and breath sounds checked- equal and bilateral Secured at: 21 cm Tube secured with: Tape Dental Injury: Teeth and Oropharynx as per pre-operative assessment  Comments: 1st attempt Miller 2 blade, Grade II view w/anterior pressure, excess white foam consistency secretions suctioned, limited sniffing position on hospital bed; elected glidescope for 2nd attempt.

## 2022-02-03 NOTE — Op Note (Signed)
OPERATIVE REPORT  DATE OF SURGERY: 02/03/2022  PATIENT NAME:  Beverly [name] MRN: [id number] DOB: [birth year]  PCP: Isaac Bliss, Rayford Halsted, MD  PRE-OPERATIVE DIAGNOSIS: Degenerative scoliosis spinal stenosis L3 through 5 and spondylolisthesis L4-5.   Status post  OLIF L3-5.  POST-OPERATIVE DIAGNOSIS: Same  PROCEDURE:   Posterior lumbar decompression L3-4 L4-5 Posterior arthrodesis L3-5 Posterior instrumented fusion L3-5  SURGEON:  Melina Schools, MD  PHYSICIAN ASSISTANT: None  ANESTHESIA:   General  EBL: 638 ml   Complications: None  Implants: NuVasive cortical pedicle screws.  L3 and 4: 5.5 x 40 mm length.  L5: 6.5 x 40 mm.  Graft: Autograft from decompression  Monitoring: No adverse free running EMG activity or SSEP activity.  All 6 screws were directly stimulated.  Both the L3 and L4 had no activity greater than 40 mA.  Left L5: Positive at 37 mA.  Right L5: Positive at 33 mA.  BRIEF HISTORY: Beverly [name] is a 76 y.o. female who has significant back buttock and neuropathic leg pain.  Attempted conservative management had failed to alleviate her symptoms so she elected to move forward with the 2-day spinal fusion.  Patient had an uneventful interbody fusion yesterday and this morning still was having neuropathic right leg pain.  Because of the ongoing neuropathic pain we elected to move forward with the decompression in addition to the supplemental instrumentation.  All appropriate risks, benefits, and alternatives to surgery were discussed and consent was obtained.  PROCEDURE DETAILS: Patient was brought into the operating room and was properly positioned on the operating room table.  After induction with general anesthesia the patient was endotracheally intubated.  A timeout was taken to confirm all important data: including patient, procedure, and the level. Teds, SCD's were applied.   The patient was turned prone onto the spine frame and the neuromonitoring rep  placed all appropriate needles for intraoperative monitoring.  The back was then prepped and draped in a standard fashion.  Using fluoroscopy I marked out the superior aspect of the L3and S1 pedicles.  I then marked my midline incision and infiltrated with quarter percent Marcaine.  Incision was made and sharp dissection was carried out down to the deep fascia.  Deep fascia was sharply incised and using Bovie and a elevator I stripped the paraspinal muscles to expose the L2-3, L3-4, and L4-5 facet complexes.  Retractors were then placed and I confirmed using fluoroscopy the medial border of the L3 pedicle.  Once I had the exposure I then proceeded with the instrumentation.  Using fluoroscopy identified the 7 o'clock position of the right L3 pedicle and used my high-speed bur to start my pilot hole.  The pedicle awl was then used.  I advanced the awl aiming towards the 1 o'clock position.  I confirmed satisfactory positioning and trajectory with AP and lateral fluoroscopy.  Once it was properly positioned I removed it and then sounded the canal with a ball-tipped feeler.  Once I confirm this I then placed a tap and removed it.  I then palpated the hole again with a ball-tipped feeler.  The 5.5 x 40 mm length screw was then inserted.  I repeated this exact same technique at L4 and at L5.  Once all the right pedicle screws were properly positioned I then placed the medical screws on the left side.  And used the same technique starting at the inferior medial corner of the pedicle and aiming towards the superior lateral portion of the pedicle.  All pedicles were then directly stimulated and there was no adverse reaction to suggest breach of the pedicle.  At this point I remove the spinous process of L4 and L3.  A central laminotomy was done with a Kerrison rongeur at L4 and L3.  I then gently dissected through the ligamentum flavum using a Penfield 4 and then remove the central ligamentum flavum to expose the  underlying thecal sac.  Especially on the right side there was significant osteophyte formation.  So I gently dissected with the [facility] 4 creating a plane between the thecal sac and the osteophyte/thickened ligamentum flavum.  Once I had adequate plane I then used my 2 mm Kerrison rongeur to resect the osteophyte from the facet complex.  Once the medial facetectomy was complete I then identified the L5 nerve root and traced it into the L5 foramen.  A foraminotomies was performed.  At this point I could now see the pedicle of L5 and 9 new the central decompression was adequate as was the lateral recess and foramen.  I then decompressed into the left lateral recess performed resecting the ligamentum flavum and performing a medial facetectomy as well as an L5 foraminotomy.  This point I was able to easily pass my nerve hook in the lateral recess underneath the thecal sac and out the L5 and L4 foramen bilaterally.  An L3 bilateral hemilaminotomy was performed and I resected the ligamentum flavum as I had done at L4-5.  Using the same technique of gently dissecting and create a plane with the Penfield 4 I resected the medial osteophyte from the facet complex and performed an adequate medial facetectomy.  This allowed me to get into the lateral recess and resect the remaining thickened ligamentum flavum to completely decompress the L4 nerve root in the lateral recess and as it traveled into the L4 foramen.  I also performed a left sided medial foraminotomies and facetectomy.  At this point the L3-4 and L4-5 decompression was satisfactory.  I could easily pass my nerve hook superiorly and inferiorly at both levels.  I irrigated the wound copiously normal saline and then used a high-speed bur to decorticate the remaining lateral portion of the L3 4 and L4-5 facet complexes.  I then packed the posterior lateral aspect with bone that I had harvested from the decompression.  I then applied the polyaxial heads and then  contoured a rod and secured into position.   This was then repeated on the contralateral side.  All of the locking caps were torqued according manufacture standards.  Final imaging demonstrated satisfactory position of the intervertebral cages as well as the posterior pedicle screw construct.  I irrigated gated the wound again with normal saline and confirmed hemostasis by direct visualization.  Thrombin-soaked Gelfoam patty was placed over the L4 laminotomy and L3 laminotomy sites.  I then placed vancomycin powder in the deep tissue and closed the deep fascia with interrupted #1 Vicryl sutures.  I then put another portion of Vanco powder and then closed the superficial with interrupted 2-0 Vicryl sutures.  2-0 Monocryl was used to close the skin edges.  Steri-Strips dry dressing were then applied.  Patient was ultimately extubated and transferred the PACU without incident.  The end of the case all needle sponge counts were correct.  Melina Schools, MD 02/03/2022 12:10 PM

## 2022-02-03 NOTE — Progress Notes (Signed)
PT Cancellation Note  Patient Details Name: Beverly [name] MRN: [id number] DOB: 1945/07/26   Cancelled Treatment:    Reason Eval/Treat Not Completed: Patient at procedure or test/unavailable. PT eval received, chart reviewed. Pt currently in surgery. Will check back as schedule allows to initiate PT evaluation.    Thelma Comp 02/03/2022, 12:10 PM  Rolinda Roan, PT, DPT Acute Rehabilitation Services Secure Chat Preferred Office: [phone]

## 2022-02-03 NOTE — Brief Op Note (Signed)
02/03/2022  12:23 PM  PATIENT:  Beverly [name]  76 y.o. female  PRE-OPERATIVE DIAGNOSIS:  Degenerative scoliosis with spinal stenosis and degenerative spondylothesis  POST-OPERATIVE DIAGNOSIS:  Degenerative scoliosis with spinal stenosis and degenerative spondylothesis  PROCEDURE:  Procedure(s) with comments: POSTERIOR PEDICLE SCREW FIXATION AND DECOMPRESSION LUMBAR THREE TO FIVE (N/A) - 4 HRS  SURGEON:  Surgeon(s) and Role:    Melina Schools, MD - Primary  PHYSICIAN ASSISTANT:   ASSISTANTS: none   ANESTHESIA:   general  EBL:  450 mL   BLOOD ADMINISTERED:none  DRAINS: none   LOCAL MEDICATIONS USED:  MARCAINE     SPECIMEN:  No Specimen  DISPOSITION OF SPECIMEN:  N/A  COUNTS:  YES  TOURNIQUET:  * No tourniquets in log *  DICTATION: .Dragon Dictation  PLAN OF CARE: Admit to inpatient   PATIENT DISPOSITION:  PACU - hemodynamically stable.

## 2022-02-03 NOTE — Progress Notes (Signed)
  Progress Note    02/03/2022 5:00 PM Day of Surgery  Subjective:  sitting up on side of bed. Overall doing well. Has ambulated in hallway. Tolerating diet. Voiding without difficulty. Only complaint presently is something in her eye   Vitals:   02/03/22 1345 02/03/22 1403  BP: (!) 114/56 (!) 106/53  Pulse: 74 70  Resp: 20 18  Temp: 99.7 F (37.6 C) 98.7 F (37.1 C)  SpO2: 94% 96%   Physical Exam: Cardiac:  regular Lungs:  non labored Incisions:  left oblique incision is c/d/I. Honeycomb dressing in place Extremities:  well perfused and warm with palpable DP pulses bilaterally Abdomen:  soft, non tender, non distended Neurologic: alert and oriented  CBC    Component Value Date/Time   WBC 5.7 01/07/2022 1538   RBC 3.84 01/07/2022 1538   HGB 12.1 01/07/2022 1538   HGB 11.9 06/03/2021 1244   HCT 36.4 01/07/2022 1538   HCT 35.3 06/03/2021 1244   PLT CANCELED 01/07/2022 1538   PLT 150 06/03/2021 1244   MCV 94.8 01/07/2022 1538   MCV 96 06/03/2021 1244   MCH 31.5 01/07/2022 1538   MCHC 33.2 01/07/2022 1538   RDW 14.8 01/07/2022 1538   RDW 14.2 06/03/2021 1244   LYMPHSABS 1,117 01/07/2022 1538   LYMPHSABS 1.5 08/22/2019 1054   MONOABS 0.5 12/18/2021 1526   EOSABS 51 01/07/2022 1538   EOSABS 0.1 08/22/2019 1054   BASOSABS 29 01/07/2022 1538   BASOSABS 0.0 08/22/2019 1054    BMET    Component Value Date/Time   NA 139 01/19/2022 1200   NA 142 06/03/2021 1244   K 4.5 01/19/2022 1200   CL 105 01/19/2022 1200   CO2 31 01/19/2022 1200   GLUCOSE 93 01/19/2022 1200   BUN 13 01/19/2022 1200   BUN 11 06/03/2021 1244   CREATININE 0.71 01/19/2022 1200   CREATININE 0.73 02/04/2021 1122   CALCIUM 9.1 01/19/2022 1200   GFRNONAA >60 01/19/2022 1200   GFRAA >60 06/22/2019 0917    INR No results found for: "INR"   Intake/Output Summary (Last 24 hours) at 02/03/2022 1700 Last data filed at 02/03/2022 1300 Gross per 24 hour  Intake 1450 ml  Output 4875 ml  Net  -3425 ml     Assessment/Plan:  76 y.o. female is s/p L3-L4 and L4-L OLIF Day of Surgery   Doing well post op Has ambulated without issues Left oblique incision is intact with honeycomb dressing clean and dry Lower extremities are well perfused and warm with palpable DP pulses bilaterally She is doing well from vascular standpoint. She can follow up with [place] as needed   Karoline Caldwell, PA-C [facility] [phone] 02/03/2022 5:00 PM

## 2022-02-03 NOTE — Progress Notes (Signed)
    Subjective: Procedure(s) (LRB): POSTERIOR LUMBAR FUSION 2 LEVEL L3-5, POSSIBLE DECOMPRESSION (N/A) Day of Surgery  Patient reports pain as 4 on 0-10 scale.  Reports increased right radicular leg pain reports incisional back pain   N/A void Negative bowel movement Negative flatus Negative chest pain or shortness of breath  Objective: Vital signs in last 24 hours: Temp:  [98 F (36.7 C)-100 F (37.8 C)] 100 F (37.8 C) (12/18 2328) Pulse Rate:  [60-67] 66 (12/18 2328) Resp:  [13-21] 18 (12/18 2328) BP: (136-162)/(56-73) 162/68 (12/18 2328) SpO2:  [93 %-100 %] 95 % (12/18 2328) Arterial Line BP: (180-184)/(70-81) 180/70 (12/18 1230)  Intake/Output from previous day: 12/18 0701 - 12/19 0700 In: 1940 [P.O.:240; I.V.:1600] Out: 6122 [Urine:4450; Blood:200]  Labs: No results for input(s): "WBC", "RBC", "HCT", "PLT" in the last 72 hours. No results for input(s): "NA", "K", "CL", "CO2", "BUN", "CREATININE", "GLUCOSE", "CALCIUM" in the last 72 hours. No results for input(s): "LABPT", "INR" in the last 72 hours.  Physical Exam: Neurologically intact ABD soft Intact pulses distally Incision: dressing C/D/I and no drainage Compartment soft Positive numbness and dysesthesias in the right anterior thigh.  5/5 motor strength in the lower extremity.Negative straight leg raise test.No radicular left leg pain.  Body mass index is 25.69 kg/m.   Assessment/Plan: Patient stable  Patient reports that she still has right radicular leg pain.  As result we will move forward with the posterior decompression on the right side.  I think this can be accomplished with hemilaminotomies of L3-4 and L4-5 along with the posterior instrumentation L3-5.  Melina Schools, MD [facility] [phone]

## 2022-02-03 NOTE — Transfer of Care (Signed)
Immediate Anesthesia Transfer of Care Note  Patient: Beverly [name]  Procedure(s) Performed: POSTERIOR PEDICLE SCREW FIXATION AND DECOMPRESSION LUMBAR THREE TO FIVE (Spine Lumbar)  Patient Location: PACU  Anesthesia Type:General  Level of Consciousness: awake, alert , and oriented  Airway & Oxygen Therapy: Patient Spontanous Breathing and Patient connected to face mask oxygen  Post-op Assessment: Report given to RN, Post -op Vital signs reviewed and stable, Patient moving all extremities X 4, and Patient able to stick tongue midline  Post vital signs: Reviewed  Last Vitals:  Vitals Value Taken Time  BP 135/115 02/03/22 1217  Temp 99.1   Pulse 83 02/03/22 1220  Resp 19 02/03/22 1220  SpO2 99 % 02/03/22 1220  Vitals shown include unvalidated device data.  Last Pain:  Vitals:   02/03/22 0255  TempSrc:   PainSc: Asleep      Patients Stated Pain Goal: 2 (90/24/09 7353)  Complications: No notable events documented.

## 2022-02-03 NOTE — Anesthesia Postprocedure Evaluation (Addendum)
Anesthesia Post Note  Patient: Beverly [name]  Procedure(s) Performed: POSTERIOR PEDICLE SCREW FIXATION AND DECOMPRESSION LUMBAR THREE TO FIVE (Spine Lumbar)     Patient location during evaluation: PACU Anesthesia Type: General Level of consciousness: awake and alert Pain management: pain level controlled Vital Signs Assessment: post-procedure vital signs reviewed and stable Respiratory status: spontaneous breathing, nonlabored ventilation and respiratory function stable Cardiovascular status: blood pressure returned to baseline and stable Postop Assessment: no apparent nausea or vomiting Anesthetic complications: no Comments: Patient complaining of itching and scratching feeling in her left eye. Suspect Corneal abrasion. Will place on corneal abrasion protocol.   No notable events documented.  Last Vitals:  Vitals:   02/03/22 1230 02/03/22 1245  BP: (!) 105/49 (!) 95/56  Pulse: 81 76  Resp: 16 20  Temp:    SpO2: 93% 92%    Last Pain:  Vitals:   02/03/22 1245  TempSrc:   PainSc: 9                  Makylah Bossard A.

## 2022-02-04 ENCOUNTER — Encounter (HOSPITAL_COMMUNITY): Payer: Self-pay | Admitting: Orthopedic Surgery

## 2022-02-04 MED ORDER — TRIAMCINOLONE ACETONIDE 55 MCG/ACT NA AERO
2.0000 | INHALATION_SPRAY | Freq: Every day | NASAL | Status: DC
  Administered 2022-02-04: 2 via NASAL
  Filled 2022-02-04: qty 21.6

## 2022-02-04 MED ORDER — MONTELUKAST SODIUM 10 MG PO TABS
10.0000 mg | ORAL_TABLET | Freq: Every day | ORAL | Status: DC
  Administered 2022-02-04: 10 mg via ORAL
  Filled 2022-02-04: qty 1

## 2022-02-04 NOTE — Progress Notes (Signed)
Patient alert and oriented, mae's well, voiding adequate amount of urine, swallowing without difficulty, no c/o pain at time of discharge. Patient discharged home with family. Script and discharged instructions given to patient. Patient and family stated understanding of instructions given. Patient has an appointment with Dr. Brooks  

## 2022-02-04 NOTE — TOC Transition Note (Signed)
Transition of Care [facility]) - CM/SW Discharge Note   Patient Details  Name: Beverly [name] MRN: [id number] Date of Birth: [birth year]  Transition of Care [facility]) CM/SW Contact:  Pollie Friar, RN Phone Number: 02/04/2022, 1:21 PM   Clinical Narrative:    Pt is discharging home with home health services through [place]. Information on the AVS. Alvis Lemmings will contact her for the first home visit. Pt has transportation home today.   Final next level of care: Home w Home Health Services Barriers to Discharge: No Barriers Identified   Patient Goals and CMS Choice   CMS Medicare.gov Compare Post Acute Care list provided to:: Patient Choice offered to / list presented to : Patient    Discharge Placement                       Discharge Plan and Services                          HH Arranged: PT [facility] Agency: [place] Date [place]: 02/04/22   Representative spoke with at [place]: [place]) Interventions     Readmission Risk Interventions     No data to display

## 2022-02-04 NOTE — Evaluation (Signed)
Physical Therapy Evaluation  Patient Details Name: Beverly [name] MRN: [id number] DOB: 1945-10-18 Today's Date: 02/04/2022  History of Present Illness  Pt is a 76 y.o. female presenting with significant back, buttock, and neuropathic leg pain was found to have degenerative scoliosis and spinal stenosis L3-5 and spondylolisthesis L4-5. S/p OLIF L3-5 12/18; s/p Posterior lumbar decompression, arthrodesis, and instrumented fusion L3-5 12/19. PMH significant for basal cell carcinoma of skin, Bowen's disease R knee forearm L shoulder, GERD, diverticulitis, HLD, IBS, mitral regurgitation, osteoarthritis, R rotator cuff tear, and sqamous cell carcinoma.   Clinical Impression  Pt admitted with above diagnosis. At the time of PT eval, pt was able to demonstrate transfers and ambulation with supervision to min guard assist and RW for support. Pt was educated on precautions, brace application/wearing schedule, appropriate activity progression, and car transfer. Pt currently with functional limitations due to the deficits listed below (see PT Problem List). Pt will benefit from skilled PT to increase their independence and safety with mobility to allow discharge to the venue listed below.         Recommendations for follow up therapy are one component of a multi-disciplinary discharge planning process, led by the attending physician.  Recommendations may be updated based on patient status, additional functional criteria and insurance authorization.  Follow Up Recommendations Home health PT      Assistance Recommended at Discharge PRN  Patient can return home with the following  A little help with walking and/or transfers;A little help with bathing/dressing/bathroom;Assistance with cooking/housework;Assist for transportation;Help with stairs or ramp for entrance    Equipment Recommendations Rolling walker (2 wheels)  Recommendations for Other Services       Functional Status Assessment Patient has  had a recent decline in their functional status and demonstrates the ability to make significant improvements in function in a reasonable and predictable amount of time.     Precautions / Restrictions Precautions Precautions: Back Precaution Booklet Issued: Yes (comment) Precaution Comments: Reviewed handout and pt was cued for precautions during functional mobility. Required Braces or Orthoses: Spinal Brace Spinal Brace: Lumbar corset Restrictions Weight Bearing Restrictions: No      Mobility  Bed Mobility Overal bed mobility: Needs Assistance Bed Mobility: Rolling, Sit to Sidelying, Sidelying to Sit Rolling: Supervision Sidelying to sit: Supervision     Sit to sidelying: Supervision General bed mobility comments: VC's for optimal log roll technique. HOB flat and rails lowered to simulate home environment.    Transfers Overall transfer level: Needs assistance Equipment used: Rolling walker (2 wheels) Transfers: Sit to/from Stand Sit to Stand: Min guard           General transfer comment: Close guard for safety. Pt required increased time to power up to full stand due to pain but was able to manage without physical assist.    Ambulation/Gait Ambulation/Gait assistance: Min guard Gait Distance (Feet): 300 Feet Assistive device: Rolling walker (2 wheels) Gait Pattern/deviations: Step-through pattern, Decreased stride length, Trunk flexed Gait velocity: Decreased Gait velocity interpretation: <1.31 ft/sec, indicative of household ambulator   General Gait Details: VC's for improved posture, closer walker proximity, and forward gaze. No overt LOB noted.  Stairs            Wheelchair Mobility    Modified Rankin (Stroke Patients Only)       Balance Overall balance assessment: Mild deficits observed, not formally tested  Pertinent Vitals/Pain Pain Assessment Pain Assessment: Faces Faces Pain  Scale: Hurts even more Pain Location: operative sites Pain Descriptors / Indicators: Operative site guarding Pain Intervention(s): Limited activity within patient's tolerance, Monitored during session, Repositioned    Home Living Family/patient expects to be discharged to:: Private residence Living Arrangements: Alone Available Help at Discharge: Friend(s);Available PRN/intermittently;Neighbor Type of Home: House Home Access: Stairs to enter Entrance Stairs-Rails: None Entrance Stairs-Number of Steps: 2   Home Layout: One level Home Equipment: Grab bars - toilet;Grab bars - tub/shower;Hand held shower head;Shower seat - built in      Prior Function Prior Level of Function : Independent/Modified Independent                     Hand Dominance   Dominant Hand: Left    Extremity/Trunk Assessment   Upper Extremity Assessment Upper Extremity Assessment: Defer to OT evaluation    Lower Extremity Assessment Lower Extremity Assessment: Generalized weakness (mild; consistent with pre-op diagnosis)    Cervical / Trunk Assessment Cervical / Trunk Assessment: Back Surgery  Communication   Communication: No difficulties  Cognition Arousal/Alertness: Awake/alert Behavior During Therapy: WFL for tasks assessed/performed Overall Cognitive Status: Within Functional Limits for tasks assessed                                          General Comments General comments (skin integrity, edema, etc.): VSS. Pt reports neighbors can assist at home if needed    Exercises     Assessment/Plan    PT Assessment Patient needs continued PT services  PT Problem List Decreased strength;Decreased range of motion;Decreased activity tolerance;Decreased balance;Decreased mobility;Decreased knowledge of use of DME;Decreased safety awareness;Decreased knowledge of precautions;Pain       PT Treatment Interventions DME instruction;Gait training;Stair training;Functional  mobility training;Therapeutic activities;Therapeutic exercise;Balance training;Patient/family education    PT Goals (Current goals can be found in the Care Plan section)  Acute Rehab PT Goals Patient Stated Goal: Be able to manage at home without assist PT Goal Formulation: With patient Time For Goal Achievement: 02/11/22 Potential to Achieve Goals: Good    Frequency Min 5X/week     Co-evaluation               AM-PAC PT "6 Clicks" Mobility  Outcome Measure Help needed turning from your back to your side while in a flat bed without using bedrails?: A Little Help needed moving from lying on your back to sitting on the side of a flat bed without using bedrails?: A Little Help needed moving to and from a bed to a chair (including a wheelchair)?: A Little Help needed standing up from a chair using your arms (e.g., wheelchair or bedside chair)?: A Little Help needed to walk in hospital room?: A Little Help needed climbing 3-5 steps with a railing? : A Little 6 Click Score: 18    End of Session Equipment Utilized During Treatment: Back brace;Gait belt Activity Tolerance: Patient tolerated treatment well Patient left: in bed;with call bell/phone within reach Nurse Communication: Mobility status PT Visit Diagnosis: Unsteadiness on feet (R26.81);Pain Pain - part of body:  (back)    Time: 6734-1937 PT Time Calculation (min) (ACUTE ONLY): 26 min   Charges:   PT Evaluation $PT Eval Low Complexity: 1 Low PT Treatments $Gait Training: 8-22 mins        Rolinda Roan, PT, DPT Acute Rehabilitation Services  Secure Chat Preferred Office: [place] 02/04/2022, 1:32 PM

## 2022-02-04 NOTE — Discharge Summary (Signed)
Patient ID: Beverly [name] MRN: [id number] DOB/AGE: 22-Oct-1945 76 y.o.  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   right sholder   BCC (basal cell carcinoma of skin) 05/09/2014   mid lower back   BCC (basal cell carcinoma of skin) 05/03/2017   right low back   BCC (basal cell carcinoma of skin) 05/03/2017   left upper back   BCC (basal cell carcinoma of skin) 07/05/2018   left mid back   BCC (basal cell carcinoma of skin) 05/20/1992   upper back   BCC (basal cell carcinoma of skin) 07/29/1993   left sholder medial   BCC (basal cell carcinoma of skin) 07/29/1993   left sholder lateral   BCC (basal cell carcinoma of skin) 07/29/1993   right thigh   BCC (basal cell carcinoma of skin) 07/29/1993   right sholder   BCC (basal cell carcinoma of skin) 12/22/1994   right mid forearm   BCC (basal cell carcinoma of skin) 12/22/1994   right upper forearm   BCC (basal cell carcinoma of skin) 12/22/1994   lower right upper arm   BCC (basal cell carcinoma of skin) 12/22/1994   right upper arm sholder   BCC (basal cell carcinoma of skin) 08/11/1995   left leg below knee   BCC (basal cell carcinoma of skin) 04/11/2002   mid back   BCC (basal cell carcinoma of skin) 12/10/2002   right center upper back   BCC (basal cell carcinoma of skin) 05/26/2005   right post sholder   BCC (basal cell carcinoma) 05/09/2014   left inner shin   BCC (basal cell  carcinoma) 06/12/2014   left forearm   Bowen's disease 10/07/1994   right post knee, right inner forearm/wrist   Bowen's disease 08/11/1995   left sholder   Bronchiectasis ([place])    Cataract    left   Chronic pain    Common variable immunodeficiency ([place])    sees dr Gayleen Orem 02-11-2021 epic   Coronary artery disease    Depression    hx of   Diverticulosis of colon 1998   mild   DJD (degenerative joint disease)    Duodenal ulcer    h/o yrs ago   GERD (gastroesophageal reflux disease)    History of SCC (squamous cell carcinoma) of skin    Dr. Denna Haggard   History of sinus bradycardia    HLD (hyperlipidemia)    hypertriglyceridemia   Hypertensive retinopathy of both eyes 11/18/2017   Hypothyroidism    IBS (irritable bowel syndrome)    Internal hemorrhoids 1998   Lymphocytic colitis    Mitral regurgitation    mild   Nocardiosis    relased by infection disease dec 2022   Osteoarthritis    feet,shoulder,neck,back,hips and hands.   Pneumonia 2015   PONV (postoperative nausea and vomiting)    Rotator cuff tear, right 02/2019   infraspinatus and supraspinatus, and dislocation of  long head of bicep tendon (Dr. Alvan Dame)   SCC (squamous cell carcinoma) 11/26/2014   left hand, right hand, right deltoid mnay areas   SCC (squamous cell carcinoma) 05/03/2017   left cheek   Sleep apnea    uses a mouth guard nightly   Squamous cell carcinoma in situ (SCCIS) 07/05/2018   left hand   Tracheobronchomalacia    Vitamin D deficiency    mild    Surgeries: Procedure(s): POSTERIOR PEDICLE SCREW FIXATION AND DECOMPRESSION LUMBAR THREE TO FIVE on 02/03/2022   Consultants: Treatment Team:  Marty Heck, MD  Discharged Condition: Improved  Hospital Course: Beverly [name] is an 76 y.o. female who was admitted 02/02/2022 for operative treatment of S/P lumbar fusion. Patient failed conservative treatments (please see the history and physical for the specifics) and had severe  unremitting pain that affects sleep, daily activities and work/hobbies. After pre-op clearance, the patient was taken to the operating room on 02/03/2022 and underwent  Procedure(s): POSTERIOR PEDICLE SCREW FIXATION AND DECOMPRESSION LUMBAR THREE TO FIVE.    Patient was given perioperative antibiotics:  Anti-infectives (From admission, onward)    Start     Dose/Rate Route Frequency Ordered Stop   02/03/22 1500  ceFAZolin (ANCEF) IVPB 1 g/50 mL premix        1 g 100 mL/hr over 30 Minutes Intravenous Every 8 hours 02/03/22 1354 02/03/22 2346   02/03/22 1141  vancomycin (VANCOCIN) powder  Status:  Discontinued          As needed 02/03/22 1142 02/03/22 1210   02/03/22 0705  ceFAZolin (ANCEF) 2-4 GM/100ML-% IVPB       Note to Pharmacy: Maude Leriche: cabinet override      02/03/22 0705 02/03/22 1914   02/02/22 1315  ceFAZolin (ANCEF) IVPB 1 g/50 mL premix        1 g 100 mL/hr over 30 Minutes Intravenous Every 8 hours 02/02/22 1310 02/02/22 2143   02/02/22 1243  ceFAZolin (ANCEF) IVPB 2g/100 mL premix  Status:  Discontinued        2 g 200 mL/hr over 30 Minutes Intravenous 30 min pre-op 02/02/22 1243 02/02/22 1440   02/02/22 0545  ceFAZolin (ANCEF) IVPB 2g/100 mL premix        2 g 200 mL/hr over 30 Minutes Intravenous 30 min pre-op 02/02/22 0545 02/02/22 0820        Patient was given sequential compression devices and early ambulation to prevent DVT.   Patient benefited maximally from hospital stay and there were no complications. At the time of discharge, the patient was urinating/moving their bowels without difficulty, tolerating a regular diet, pain is controlled with oral pain medications and they have been cleared by PT/OT.   Recent vital signs: Patient Vitals for the past 24 hrs:  BP Temp Temp src Pulse Resp SpO2  02/04/22 0856 (!) 128/44 98.9 F (37.2 C) Oral 76 16 98 %  02/04/22 0632 (!) 105/43 99.6 F (37.6 C) Oral 72 20 95 %  02/03/22 2347 (!) 109/41 100.1 F (37.8 C)  Oral 63 20 98 %  02/03/22 1938 (!) 127/53 98.4 F (36.9 C) Oral 77 20 96 %  02/03/22 1850 (!) 114/37 98.3 F (36.8 C) Oral 75 16 99 %     Recent laboratory studies: No results for input(s): "WBC", "HGB", "HCT", "PLT", "NA", "K", "CL", "CO2", "BUN", "CREATININE", "GLUCOSE", "INR", "CALCIUM" in the last 72 hours.  Invalid input(s): "PT", "2"   Discharge Medications:   Allergies as of 02/04/2022  Reactions   Adhesive [tape] Rash   Blister    Codeine Rash        Medication List     STOP taking these medications    acetaminophen 500 MG tablet Commonly known as: TYLENOL   acetaminophen 650 MG CR tablet Commonly known as: TYLENOL   amoxicillin 500 MG tablet Commonly known as: AMOXIL   diclofenac Sodium 1 % Gel Commonly known as: VOLTAREN   HYDROcodone-acetaminophen 7.5-325 MG tablet Commonly known as: NORCO   multivitamin with minerals tablet   ondansetron 4 MG disintegrating tablet Commonly known as: ZOFRAN-ODT   oxyCODONE-acetaminophen 7.5-325 MG tablet Commonly known as: Percocet Replaced by: oxyCODONE-acetaminophen 10-325 MG tablet   [place] 4 % Crea Generic drug: Fluorouracil       TAKE these medications    albuterol 108 (90 Base) MCG/ACT inhaler Commonly known as: Ventolin HFA Inhale 2 puffs into the lungs every 6 (six) hours as needed for wheezing or shortness of breath. What changed:  how much to take when to take this   ARTIFICIAL TEAR OP Place 2 drops into both eyes in the morning and at bedtime.   atorvastatin 40 MG tablet Commonly known as: LIPITOR Take 1 tablet (40 mg total) by mouth daily. What changed: when to take this   Azelastine HCl 137 MCG/SPRAY Soln Place 2 sprays into both nostrils 2 (two) times daily. Use in each nostril as directed   Belbuca 750 MCG Film Generic drug: Buprenorphine HCl Place 750 mcg film inside cheek to dissolve 2 (two) times daily.   Belbuca 750 MCG Film Generic drug: Buprenorphine HCl Place 1  Film inside cheek 2 (two) times daily.   budesonide 3 MG 24 hr capsule Commonly known as: ENTOCORT EC Take 1 capsule (3 mg total) by mouth daily.   Calcium Carbonate-Vitamin D 600-200 MG-UNIT Tabs Take 1 tablet by mouth 2 (two) times daily.   clopidogrel 75 MG tablet Commonly known as: PLAVIX Take 1 tablet (75 mg total) by mouth daily.   colestipol 1 g tablet Commonly known as: COLESTID Take 1 tablet by mouth daily.   Culturelle Adult Ult Balance Caps Take 1 capsule by mouth daily.   Docusate Sodium 100 MG capsule Take 100 mg by mouth in the morning and at bedtime.   EPINEPHrine 0.3 mg/0.3 mL Soaj injection Commonly known as: EPI-PEN Inject 0.3 mg into the muscle as needed for anaphylaxis.   famotidine 40 MG tablet Commonly known as: Pepcid Take 1 tablet (40 mg total) by mouth at bedtime.   FIBER PO Take 2 tablets by mouth in the morning and at bedtime.   gabapentin 800 MG tablet Commonly known as: NEURONTIN Take 1 tablet by mouth at bedtime. What changed:  how much to take how to take this when to take this   guaiFENesin 600 MG 12 hr tablet Commonly known as: MUCINEX Take 600 mg by mouth 2 (two) times daily.   ipratropium 0.03 % nasal spray Commonly known as: ATROVENT Place 1 spray into both nostrils 3 (three) times daily as needed for rhinitis.   levocetirizine 5 MG tablet Commonly known as: XYZAL Take 1 tablet (5 mg total) by mouth every evening. What changed: when to take this   levothyroxine 25 MCG tablet Commonly known as: SYNTHROID Take 1 tablet (25 mcg total) by mouth daily.   melatonin 5 MG Tabs Take 5 mg by mouth at bedtime.   methocarbamol 500 MG tablet Commonly known as: ROBAXIN Take 1 tablet (500 mg total) by  mouth every 8 (eight) hours as needed for up to 5 days for muscle spasms. What changed:  how much to take how to take this when to take this reasons to take this   montelukast 10 MG tablet Commonly known as: SINGULAIR Take 1  tablet (10 mg total) by mouth daily.   nitroGLYCERIN 0.4 MG SL tablet Commonly known as: Nitrostat Place 1 tablet (0.4 mg total) under the tongue every 5 (five) minutes as needed.   omega-3 acid ethyl esters 1 g capsule Commonly known as: LOVAZA Take 2 capsules (2 g total) by mouth 2 (two) times daily.   ondansetron 4 MG tablet Commonly known as: Zofran Take 1 tablet (4 mg total) by mouth every 8 (eight) hours as needed for nausea or vomiting.   oxyCODONE-acetaminophen 10-325 MG tablet Commonly known as: Percocet Take 1 tablet by mouth every 6 (six) hours as needed for up to 5 days for pain. Replaces: oxyCODONE-acetaminophen 7.5-325 MG tablet   pantoprazole 40 MG tablet Commonly known as: PROTONIX Take 1 tablet (40 mg) twice daily for 10 weeks then once a day as per GI recommendations What changed: when to take this   PARoxetine 40 MG tablet Commonly known as: PAXIL Take 40 mg by mouth at bedtime.   polyethylene glycol 17 g packet Commonly known as: MIRALAX / GLYCOLAX Take 17 g by mouth daily.   psyllium 58.6 % packet Commonly known as: METAMUCIL Take 1 packet by mouth daily.   triamcinolone 55 MCG/ACT Aero nasal inhaler Commonly known as: NASACORT Place 2 sprays into each nostril once daily.   TUMS PO Take 1 tablet by mouth as needed (heartburn).   VITAMIN B-12 PO Take 1 capsule by mouth daily.   Xembify 10 GM/50ML Soln Generic drug: Immune Globulin (Human)-klhw Inject 10 g into the skin once a week. Wednesday        Diagnostic Studies: DG Lumbar Spine 2-3 Views  Result Date: 02/03/2022 CLINICAL DATA:  L3-4 and L4-5 posterior fusion. EXAM: LUMBAR SPINE - 2-3 VIEW; DG C-ARM 1-60 MIN-NO REPORT Radiation exposure index: 86.29 mGy. COMPARISON:  February 02, 2022. FINDINGS: Four intraoperative fluoroscopic images were obtained of the lower lumbar spine. These demonstrate interval surgical posterior fusion of L3-4 and L4-5 with bilateral intrapedicular screw  placement. IMPRESSION: Fluoroscopic guidance provided during surgical posterior fusion of L3-4 and L4-5. Electronically Signed   By: Marijo Conception M.D.   On: 02/03/2022 11:50   DG C-Arm 1-60 Min-No Report  Result Date: 02/03/2022 Fluoroscopy was utilized by the requesting physician.  No radiographic interpretation.   DG C-Arm 1-60 Min-No Report  Result Date: 02/03/2022 Fluoroscopy was utilized by the requesting physician.  No radiographic interpretation.   DG C-Arm 1-60 Min-No Report  Result Date: 02/03/2022 Fluoroscopy was utilized by the requesting physician.  No radiographic interpretation.   DG C-Arm 1-60 Min-No Report  Result Date: 02/03/2022 Fluoroscopy was utilized by the requesting physician.  No radiographic interpretation.   DG Lumbar Spine 2-3 Views  Result Date: 02/02/2022 CLINICAL DATA:  Elective surgery. EXAM: LUMBAR SPINE - 2-3 VIEW COMPARISON:  None Available. FINDINGS: Four fluoroscopic spot views obtained in the operating room in frontal and lateral projections. Interbody spacers at L3-L4 and L4-L5 (assuming L5 is the lower most non-rib-bearing lumbar vertebra). Fluoroscopy time 4 minutes 18 seconds. Dose 144.07 mGy IMPRESSION: Fluoroscopic spot views during lumbar surgery. Electronically Signed   By: Keith Rake M.D.   On: 02/02/2022 12:34   DG C-Arm 1-60 Min-No Report  Result Date: 02/02/2022 Fluoroscopy was utilized by the requesting physician.  No radiographic interpretation.   DG C-Arm 1-60 Min-No Report  Result Date: 02/02/2022 Fluoroscopy was utilized by the requesting physician.  No radiographic interpretation.   DG C-Arm 1-60 Min-No Report  Result Date: 02/02/2022 Fluoroscopy was utilized by the requesting physician.  No radiographic interpretation.   DG C-Arm 1-60 Min-No Report  Result Date: 02/02/2022 Fluoroscopy was utilized by the requesting physician.  No radiographic interpretation.   DG OR LOCAL ABDOMEN  Result Date:  02/02/2022 CLINICAL DATA:  Elective surgery. EXAM: OR LOCAL ABDOMEN COMPARISON:  None Available. FINDINGS: Status post interbody fusion of L3-4 and L4-5. Surgical staples are seen in the left side of the abdomen and pelvis. Status post cholecystectomy. No other definite radiopaque foreign body is noted. IMPRESSION: Status post interbody fusion of L3-4 and L4-5. Surgical staples are noted in left side of the abdomen pelvis. No other definite radiopaque foreign body is noted. These results were called by telephone at the time of interpretation on 02/02/2022 at 11:43 am to provider Paige in OR 4, who verbally acknowledged these results. Electronically Signed   By: Marijo Conception M.D.   On: 02/02/2022 11:44    Discharge Instructions     Incentive spirometry RT   Complete by: As directed    Incentive spirometry RT   Complete by: As directed         Follow-up Information     Melina Schools, MD Follow up in 2 week(s).   Specialty: Orthopedic Surgery Why: If symptoms worsen, For suture removal, For wound re-check Contact information: [address] [place] [address] [phone]         [facility] Follow up.   Specialty: Home Health Services Why: The home health agency will contact you for the first home visit. Contact information: [place] [address] [phone]                 Discharge Plan:  discharge to home  Disposition: Patient has done exceptionally well status post anterior posterior L3-5 instrumented fusion.  She is tolerating a regular diet, her pain is well-controlled with oral medications, and she has been voiding spontaneously.  Patient has positive bowel movement and has minimal pain.  She has been evaluated by PT and OT and has been cleared for home discharge.  Patient will follow-up with me in 2 weeks for reevaluation.  Medications on discharge include Percocet, Robaxin, and Zofran for nausea.  If she  has any issues or problems she knows to contact my office and I will be happy to reevaluate her.    Signed: Dahlia Bailiff for Dr. Melina Schools [facility] [phone] 02/04/2022, 6:09 PM

## 2022-02-04 NOTE — Progress Notes (Signed)
    Subjective: Procedure(s) (LRB): POSTERIOR PEDICLE SCREW FIXATION AND DECOMPRESSION LUMBAR THREE TO FIVE (N/A) 1 Day Post-Op  Patient reports pain as 3 on 0-10 scale.  Reports decreased leg pain reports incisional back pain   Positive void Positive bowel movement Positive flatus Negative chest pain or shortness of breath  Objective: Vital signs in last 24 hours: Temp:  [98.3 F (36.8 C)-100.1 F (37.8 C)] 99.6 F (37.6 C) (12/20 0347) Pulse Rate:  [63-81] 72 (12/20 0632) Resp:  [10-20] 20 (12/20 4259) BP: (95-127)/(37-63) 105/43 (12/20 5638) SpO2:  [92 %-99 %] 95 % (12/20 7564)  Intake/Output from previous day: 12/19 0701 - 12/20 0700 In: 3329 [P.O.:240; I.V.:1300; IV Piggyback:150] Out: 2025 [Urine:1575; Blood:450]  Labs: No results for input(s): "WBC", "RBC", "HCT", "PLT" in the last 72 hours. No results for input(s): "NA", "K", "CL", "CO2", "BUN", "CREATININE", "GLUCOSE", "CALCIUM" in the last 72 hours. No results for input(s): "LABPT", "INR" in the last 72 hours.  Physical Exam: Neurologically intact ABD soft Intact pulses distally Incision: dressing C/D/I and no drainage Compartment soft Body mass index is 25.69 kg/m.   Assessment/Plan: Patient stable  xrays n/a Continue mobilization with physical therapy Continue care  Patient is doing exceptionally well status post anterior posterior lumbar decompression and instrumented fusion.  Patient's radicular right leg pain has improved status post the posterior decompression.  Incision sites are clean dry and intact with no drainage. She will be evaluated by PT/OT today to determine the best discharge plan.  Currently we are planning for SNF discharge versus home discharge with home health aide.  Melina Schools, MD [facility] [phone]

## 2022-02-04 NOTE — Evaluation (Signed)
Occupational Therapy Evaluation Patient Details Name: Beverly [name] MRN: [id number] DOB: 1945/05/31 Today's Date: 02/04/2022   History of Present Illness Pt is a 76 y.o. female presenting with significant back, buttock, and neuropathic leg pain was found to have degenerative scoliosis and spinal stenosis L3-5 and spondylolisthesis L4-5. S/p OLIF L3-5 12/18; s/p Posterior lumbar decompression, arthrodesis, and instrumented fusion L3-5 12/19. PMH significant for basal cell carcinoma of skin, Bowen's disease R knee forearm L shoulder, GERD, diverticulitis, HLD, IBS, mitral regurgitation, osteoarthritis, R rotator cuff tear, and sqamous cell carcinoma.   Clinical Impression   PTA, pt lived alone and was independent. Upon eval, pt performing UB dressing with supervision/set-up for brace application, and LB ADL with min guard A with use of AE. Pt educated and demonstrating use of compensatory techniques for bed mobility, brace application, LB dressing, toileting, grooming, and shower transfers within precautions. All education provided, pt demonstrates understanding. Reporting neighbors or friend can help as needed upon discharge home. Recommending discharge home with no OT follow up at this time. OT to sign off.      Recommendations for follow up therapy are one component of a multi-disciplinary discharge planning process, led by the attending physician.  Recommendations may be updated based on patient status, additional functional criteria and insurance authorization.   Follow Up Recommendations  No OT follow up     Assistance Recommended at Discharge Intermittent Supervision/Assistance  Patient can return home with the following A little help with walking and/or transfers;A little help with bathing/dressing/bathroom;Assist for transportation;Help with stairs or ramp for entrance    Functional Status Assessment  Patient has had a recent decline in their functional status and demonstrates the  ability to make significant improvements in function in a reasonable and predictable amount of time.  Equipment Recommendations  Other (comment) (RW)    Recommendations for Other Services       Precautions / Restrictions Precautions Precautions: Back Precaution Booklet Issued: Yes (comment) Precaution Comments: All education reviewed duri8ng ADL, pt able to recall precautions. Required Braces or Orthoses: Spinal Brace Spinal Brace: Lumbar corset Restrictions Weight Bearing Restrictions: No      Mobility Bed Mobility Overal bed mobility: Needs Assistance Bed Mobility: Rolling, Sit to Sidelying Rolling: Supervision       Sit to sidelying: Supervision General bed mobility comments: EOB on arrival, supervision for use of log roll technique.    Transfers Overall transfer level: Needs assistance Equipment used: Rolling walker (2 wheels) Transfers: Sit to/from Stand Sit to Stand: Min guard           General transfer comment: Min guard A for safety; slow to rise      Balance Overall balance assessment: Mild deficits observed, not formally tested                                         ADL either performed or assessed with clinical judgement   ADL Overall ADL's : Needs assistance/impaired Eating/Feeding: Modified independent;Sitting   Grooming: Supervision/safety;Standing   Upper Body Bathing: Sitting;Set up   Lower Body Bathing: Min guard;Sit to/from stand   Upper Body Dressing : Supervision/safety;Sitting   Lower Body Dressing: Min guard;Sit to/from stand   Toilet Transfer: Agricultural engineer (2 wheels) Armed forces technical officer Details (indicate cue type and reason): Min guard A approaching supervision     Tub/ Shower Transfer: Walk-in shower;Min guard;Ambulation;Rolling walker (2 wheels) Tub/Shower Transfer  Details (indicate cue type and reason): cueing for technique/new learning Functional mobility during ADLs: Min  guard;Supervision/safety;Rolling walker (2 wheels) General ADL Comments: Min guard A approaching supervision     Vision Baseline Vision/History: 1 Wears glasses Ability to See in Adequate Light: 0 Adequate Patient Visual Report: No change from baseline Vision Assessment?: No apparent visual deficits Additional Comments: WFL for tasks assessed     Perception Perception Perception Tested?: No   Praxis Praxis Praxis tested?: Not tested    Pertinent Vitals/Pain Pain Assessment Pain Assessment: Faces Faces Pain Scale: Hurts even more Pain Location: operative sites Pain Descriptors / Indicators: Operative site guarding Pain Intervention(s): Limited activity within patient's tolerance, Monitored during session     Hand Dominance Left   Extremity/Trunk Assessment Upper Extremity Assessment Upper Extremity Assessment: Overall WFL for tasks assessed   Lower Extremity Assessment Lower Extremity Assessment: Defer to PT evaluation   Cervical / Trunk Assessment Cervical / Trunk Assessment: Back Surgery   Communication Communication Communication: No difficulties   Cognition Arousal/Alertness: Awake/alert Behavior During Therapy: WFL for tasks assessed/performed Overall Cognitive Status: Within Functional Limits for tasks assessed                                       General Comments  VSS. Pt reports neighbors can assist at home if needed    Exercises     Shoulder Instructions      Home Living Family/patient expects to be discharged to:: Private residence Living Arrangements: Alone Available Help at Discharge: Friend(s);Available PRN/intermittently;Neighbor Type of Home: House Home Access: Stairs to enter [employer] of Steps: 2 Entrance Stairs-Rails: None Home Layout: One level     Bathroom Shower/Tub: Occupational psychologist: Handicapped height     Home Equipment: Grab bars - toilet;Grab bars - tub/shower;Hand held shower  head;Shower seat - built in          Prior Functioning/Environment Prior Level of Function : Independent/Modified Independent                        OT Problem List: Decreased strength;Decreased activity tolerance;Impaired balance (sitting and/or standing);Decreased safety awareness;Decreased knowledge of use of DME or AE;Decreased knowledge of precautions;Pain      OT Treatment/Interventions:      OT Goals(Current goals can be found in the care plan section) Acute Rehab OT Goals Patient Stated Goal: get a RW OT Goal Formulation: With patient Time For Goal Achievement: 02/18/22 Potential to Achieve Goals: Good  OT Frequency:      Co-evaluation              AM-PAC OT "6 Clicks" Daily Activity     Outcome Measure Help from another person eating meals?: None Help from another person taking care of personal grooming?: A Little Help from another person toileting, which includes using toliet, bedpan, or urinal?: A Little Help from another person bathing (including washing, rinsing, drying)?: A Little Help from another person to put on and taking off regular upper body clothing?: A Little Help from another person to put on and taking off regular lower body clothing?: A Little 6 Click Score: 19   End of Session Equipment Utilized During Treatment: Gait belt;Rolling walker (2 wheels);Back brace Nurse Communication: Mobility status  Activity Tolerance: Patient tolerated treatment well Patient left: in bed;with call bell/phone within reach  OT Visit Diagnosis: Unsteadiness on feet (  R26.81);Muscle weakness (generalized) (M62.81);Pain Pain - part of body:  (operative sites)                Time: 5537-4827 OT Time Calculation (min): 28 min Charges:  OT General Charges $OT Visit: 1 Visit OT Evaluation $OT Eval Low Complexity: 1 Low OT Treatments $Self Care/Home Management : 8-22 mins  Elder Cyphers, OTR/L [facility] Acute Rehabilitation Office: [phone]     Magnus Ivan 02/04/2022, 10:44 AM

## 2022-02-04 NOTE — Progress Notes (Signed)
[facility]  Subjective  -no complaints.  No nausea or vomiting.   Objective (!) 105/43 72 99.6 F (37.6 C) (Oral) 20 95%  Intake/Output Summary (Last 24 hours) at 02/04/2022 9983 Last data filed at 02/03/2022 1520 Gross per 24 hour  Intake 1640 ml  Output 1600 ml  Net 40 ml    Abdomen with appropriate postop incisional tenderness Left DP palpable  Laboratory Lab Results: No results for input(s): "WBC", "HGB", "HCT", "PLT" in the last 72 hours. BMET No results for input(s): "NA", "K", "CL", "CO2", "GLUCOSE", "BUN", "CREATININE", "CALCIUM" in the last 72 hours.  COAG No results found for: "INR", "PROTIME" No results found for: "PTT"  Assessment/Planning:  Postop day 2 status post spine exposure for L3-L4 and L4-L5 OLIF.  Appropriate postop abdominal tenderness.  No nausea or vomiting.  Palpable DP pulse in the left foot.  Looks good from a vascular surgery standpoint.  Marty Heck 02/04/2022 8:22 AM --

## 2022-02-05 ENCOUNTER — Encounter (HOSPITAL_COMMUNITY): Payer: Self-pay | Admitting: Orthopedic Surgery

## 2022-02-05 ENCOUNTER — Telehealth: Payer: Self-pay

## 2022-02-05 ENCOUNTER — Telehealth: Payer: Self-pay | Admitting: Licensed Clinical Social Worker

## 2022-02-05 NOTE — Patient Outreach (Signed)
  Care Coordination   Initial Visit Note   02/05/2022 Name: Beverly [name] MRN: [id number] DOB: April 17, 1945  Beverly [name] is a 76 y.o. year old female who sees Isaac Bliss, Rayford Halsted, MD for primary care. I spoke with  [name] by phone today.  What matters to the patients health and wellness today?  Strengthen Support/SNF Placement    Goals Addressed             This Visit's Progress    Strengthen Support/Request for SNF Placement       Care Coordination Interventions: Solution-Focused Strategies employed:  Active listening / Reflection utilized  Emotional Support Provided Consideration of in-home help encouraged : options discussed Verbalization of feelings encouraged  LCSW received referral from [place]. Patient is requesting placement assistance to a SNF. Additional collaboration regarding patient needs LCSW collaborated with [name], with [place] [phone] Patient's assigned Physical Therapist, Delila Pereyra, is scheduled to visit patient at home today at approximately 3 PM. LCSW requested that Mr. Shucker call LCSW with an update afterwards Updated provided to patient and [facility] RN. Patient is scheduled for meal deliveries beginning on 02/11/22         SDOH assessments and interventions completed:  Yes  SDOH Interventions Today    Flowsheet Row Most Recent Value  SDOH Interventions   Housing Interventions Intervention Not Indicated  Stress Interventions Other (Comment)  [TOC RN initiated meal deliveries]        Care Coordination Interventions:  Yes, provided   Follow up plan: Follow up call scheduled for 1 day    Encounter Outcome:  Pt. Visit Completed   Christa See, MSW, [email or web address] Phone [phone] 2:12 PM

## 2022-02-05 NOTE — Patient Outreach (Signed)
  Care Coordination TOC Note Transition Care Management Unsuccessful Follow-up Telephone Call  Date of discharge and from where:  02/04/22-[place] [facility]  Attempts:  1st Attempt  Reason for unsuccessful TCM follow-up call:  Left voice message     Hetty Blend [facility] Telephonic Care Management Coordinator Direct Phone: [phone] Toll Free: [phone] Fax: [phone]

## 2022-02-05 NOTE — Patient Instructions (Signed)
Visit Information  Thank you for taking time to visit with me today. Please don't hesitate to contact me if I can be of assistance to you.   Following are the goals we discussed today:   Goals Addressed             This Visit's Progress    Strengthen Support/Request for SNF Placement       Care Coordination Interventions: Solution-Focused Strategies employed:  Active listening / Reflection utilized  Emotional Support Provided Consideration of in-home help encouraged : options discussed Verbalization of feelings encouraged  LCSW received referral from [place]. Patient is requesting placement assistance to a SNF. Additional collaboration regarding patient needs LCSW collaborated with [name], with [place] [phone] Patient's assigned Physical Therapist, Delila Pereyra, is scheduled to visit patient at home today at approximately 3 PM. LCSW requested that Mr. Shucker call LCSW with an update afterwards Updated provided to patient and [facility] RN. Patient is scheduled for meal deliveries beginning on 02/11/22        Please call the care guide team at [phone] if you need to cancel or reschedule your appointment.   If you are experiencing a Mental Health or [place] or need someone to talk to, please call the Suicide and Crisis Lifeline: 988 call 911   Patient verbalizes understanding of instructions and care plan provided today and agrees to view in [place]. Active MyChart status and patient understanding of how to access instructions and care plan via MyChart confirmed with patient.     Christa See, MSW, [email or web address] Phone [phone] 2:12 PM

## 2022-02-05 NOTE — Patient Outreach (Signed)
Care Coordination TOC Note Transition Care Management Follow-up Telephone Call Date of discharge and from where: 02/04/22-[facility] Dx: "s/p lumbar fusion" How have you been since you were released from the hospital? Incoming call from patient returning RN CM call. She voices that she has not been able to manage her care at home. Patient voices that she did not know that she was coming home post discharge.She was under the impression that she would go to rehab short term. States she wish surgeon would have discussed this with her as he would have told him that she did not have support at home and would not be able to manage.  Any questions or concerns? Yes-Patient wants to go to short term rehab facility-does not think she can manage her care at home. States that she has no one she can call to assist her. She has a neighbor who is 72yr old but will not be able to help her out much. Patient reports that her two children are out of town. They will be coming in town for the holidays ina few days but only able to stay a day or two. RN CM asked if patient had contacted family to advise them of her condition and to see If they could arrive in town sooner. She said no-that they were working and unable to get off. Strongly encouraged patient to explain the situation to see I anyone able to get in town sooner and stay with her longer. Patient did not want RN to contact family. Discussed with patient the process of getting not rehab facility post discharge from hospital and the possible time frame along with the challenges of the holiday approaching. She voiced understanding but still wished to proceed with placement.   Items Reviewed: Did the pt receive and understand the discharge instructions provided? Yes  Medications obtained and verified? No  Other? Yes  Any new allergies since your discharge? No  Dietary orders reviewed? Yes Do you have support at home? No   Home Care and Equipment/Supplies: Were  home health services ordered? yes If so, what is the name of the agency? [facility]  Has the agency set up a time to come to the patient's home? no Were any new equipment or medical supplies ordered?  Yes: walker What is the name of the medical supply agency?  Were you able to get the supplies/equipment? yes Do you have any questions related to the use of the equipment or supplies? No  Functional Questionnaire: (I = Independent and D = Dependent) ADLs: A  Bathing/Dressing- A  Meal Prep- A  Eating- A  Maintaining continence- A  Transferring/Ambulation- A  Managing Meds- A  Follow up appointments reviewed:  PCP Hospital f/u appt confirmed? No . [facility]? Yes  patient not near paperwork. Are transportation arrangements needed? Yes  If their condition worsens, is the pt aware to call PCP or go to the Emergency Dept.? Yes Was the patient provided with contact information for the PCP's office or ED? Yes Was to pt encouraged to call back with questions or concerns? Yes  SDOH assessments and interventions completed:   Yes   Care Coordination Interventions:  Education provided, [place] meals delivery-earliest delivery is 02/11/22.Referral to SW for assistance with possible placement-case discussed at length with SW. SW will follow up.    Encounter Outcome:  Pt. Visit Completed    REnzo Montgomery RN,BSN,CCM [facility] Telephonic Care Management Coordinator Direct Phone:  [phone] Toll Free: [phone] Fax: [phone]

## 2022-02-06 ENCOUNTER — Telehealth: Payer: Self-pay | Admitting: Licensed Clinical Social Worker

## 2022-02-06 ENCOUNTER — Other Ambulatory Visit (HOSPITAL_COMMUNITY): Payer: Self-pay

## 2022-02-06 ENCOUNTER — Other Ambulatory Visit: Payer: Self-pay

## 2022-02-06 ENCOUNTER — Telehealth: Payer: Self-pay | Admitting: Internal Medicine

## 2022-02-06 NOTE — Telephone Encounter (Signed)
Jasmine called and stated pt had spinal surgery recently and doing HH for PT however Dr is concerned for her being at home alone.   Jasmine wants to get her a [place] and aide and stated Alvis Lemmings is waiting to provide verbal orders for pt. Garnetta Buddy a call at [phone] and ask for Shane Crutch.   If you have any questions please call [place] at [phone]. Delana Meyer is aware Dr is out as well as the Marine scientist.  Please advise.

## 2022-02-06 NOTE — Patient Outreach (Signed)
  Care Coordination   Multidisciplinary Case Review Note    02/06/2022 Name: Beverly [name] MRN: [id number] DOB: 12-01-1945  Beverly [name] is a 76 y.o. year old female who sees Beverly [name], Beverly Halsted, MD for primary care.  The  multidisciplinary care team met today to review patient care needs and barriers.       SDOH assessments and interventions completed:  No     Care Coordination Interventions Activated:  Yes   Care Coordination Interventions:  Yes, provided   Follow up plan: Follow up call scheduled for 02/06/22    Multidisciplinary Team Attendees:   Beverly See, LCSW Beverly [name], BSW Beverly [name], [place] RN Beverly [name], [facility]  Scribe for Multidisciplinary Case Review:  Beverly [name]  Beverly [name], MSW, [name] Phone [phone] 1:10 PM

## 2022-02-06 NOTE — Patient Instructions (Signed)
Visit Information  Thank you for taking time to visit with me today. Please don't hesitate to contact me if I can be of assistance to you.   Following are the goals we discussed today:   Goals Addressed             This Visit's Progress    Strengthen Support/Request for SNF Placement   On track    Care Coordination Interventions: Solution-Focused Strategies employed:  Active listening / Reflection utilized  Emotional Support Provided Consideration of in-home help encouraged : options discussed Verbalization of feelings encouraged  LCSW received incoming call from [facility] PT, Roque Lias [facility] PT endorses concerns that patient doesn't have any support for assistance throughout the day. States patient is unable to complete ADL's or safely ambulate around home [facility] PT shared that Dr. Rolena Infante (Orthopedics) doesn't approve of physical therapy for spinal patients. Feels she will need assistance with setting up medications and managing edema Patient shared she was not provided instructions on how to care for self. Feels it's too much for her to handle [facility] PT has reviewed recommendations with patient and agreed to inform [facility] of his concerns 02/06/22        Please call the care guide team at [phone] if you need to cancel or reschedule your appointment.   If you are experiencing a Mental Health or [place] or need someone to talk to, please call the Suicide and Crisis Lifeline: 988 call 911   Patient verbalizes understanding of instructions and care plan provided today and agrees to view in [place]. Active MyChart status and patient understanding of how to access instructions and care plan via MyChart confirmed with patient.     Christa See, MSW, [email or web address] Phone [phone] 1:07 PM

## 2022-02-06 NOTE — Patient Instructions (Signed)
Visit Information  Thank you for taking time to visit with me today. Please don't hesitate to contact me if I can be of assistance to you.   Following are the goals we discussed today:   Goals Addressed             This Visit's Progress    Strengthen Support/Request for SNF Placement   On track    Care Coordination Interventions: Solution-Focused Strategies employed:  Active listening / Reflection utilized  Emotional Support Provided Consideration of in-home help encouraged : options discussed Verbalization of feelings encouraged  Patient shared maintenance medications weren't provided to her in the hospital, which negative impacts bowels, resulting in diarrhea Patient reports that [facility] PT is unable to return, due to not having orders from Dr. Burman Blacksmith with [facility] Front Desk staff, Carmelina Paddock and discussed concerns from [facility] PT regarding pt's inability to care for self after hospital d/c. Dr. Rolena Infante comes in office this afternoon and will read note. Dr. Magda Kiel assistant spoke with the patient today at 9:00 AM, encouraging her to be re-assessed at the ED if still concerned LCSW spoke with [name] with [facility]. [facility] PT sent email regarding findings and/or concerns. LCSW will upload in [place] and [place] PCP for review. [name] will send copy to [facility], as well  LCSW spoke with Candace with PCP office and requested verbal orders, per Elza Rafter, for [facility] RN and [facility] Aid. Informed her that similar request was made to [facility], due to PCP office scheduled to close today at 11:45 AM  [facility] Physical Therapist:  To whom it may concern:  re Kimberlyn Quiocho [birth year]b [birth year]  I saw Ms Voight for a [facility] evaluation on 02/05/22. She was in bed feeling very emotional and overwhelmed with her situation. She stated that she was told that she was to be transferred to a SNF post surgery and did not have the care/ support she needed at home.   Her son lives and works in [place] and her dtr in  [place]. Client c/o 6-8/10 pain in back with radiating symptoms onto bilat legs. She did not have her medications out of her bag that she had taken to the hosp and had not been taking all of her medications. She reports that she has not had a bm since Monday ( day of 1st surgery).  She has a complex medical hx and among other things she has cad s/p 2 stent placement in 3/23, OA, asthma, IBS, colitis, hypothryoidism, tracheobronchomalacia, sleep apnea, depression, chronic pain ( seen by pain clinic), common variable immunodeficiency.      She has an 23 yo neighbor who has come by to help her a little and another friend in town who works during the day. Otherwise client is by herself without any assistance.    ADL's : client req mod assist with LE dressing,  min assist UE dressing  ( once clothing laid out).  client req min assist with transfers  client displays shaking legs with being on her feet for very long and is unsteady toilet transfers min/ mod assist  - client does not have a bsc and no grab bars in the bathroom. She reports pulling up from the sink. This is against spine precautions/ restrictions provided by orthopedist. bathing   req mod assist with sponge bath.  meals  client unable to prepare meals safely on a regular basis and unable to carry to place to set down if she could.  Dr Rolena Infante has expressed through his office that he does not provide PT for his spine patients until at least 6 wks. So PT would be unable to assist client with recovery.         Our next appointment is by telephone on 02/10/22 at 10 AM  Please call the care guide team at [phone] if you need to cancel or reschedule your appointment.   If you are experiencing a Mental Health or [place] or need someone to talk to, please call the Suicide and Crisis Lifeline: 988 call 911   Patient verbalizes understanding of instructions and care plan provided today and agrees to view in [place].  Active MyChart status and patient understanding of how to access instructions and care plan via MyChart confirmed with patient.     Christa See, MSW, [email or web address] Phone [phone] 1:39 PM

## 2022-02-06 NOTE — Patient Outreach (Addendum)
Care Coordination-Collaboration Note  Follow Up Visit Note   02/06/2022 Name: Beverly [name] MRN: [id number] DOB: [birth year]  Beverly [name] is a 76 y.o. year old female who sees Isaac Bliss, Rayford Halsted, MD for primary care.   What matters to the patients health and wellness today?  Placement   Goals Addressed             This Visit's Progress    Strengthen Support/Request for SNF Placement   On track    Care Coordination Interventions: Solution-Focused Strategies employed:  Active listening / Reflection utilized  Emotional Support Provided Consideration of in-home help encouraged : options discussed Verbalization of feelings encouraged  Patient shared maintenance medications weren't provided to her in the hospital, which negative impacts bowels, resulting in diarrhea Patient reports that [facility] PT is unable to return, due to not having orders from Dr. Burman Blacksmith with [facility] Front Desk staff, Carmelina Paddock and discussed concerns from [facility] PT regarding pt's inability to care for self after hospital d/c. Dr. Rolena Infante comes in office this afternoon and will read note. Dr. Magda Kiel assistant spoke with the patient today at 9:00 AM, encouraging her to be re-assessed at the ED if still concerned LCSW spoke with [name] with [facility]. [facility] PT sent email regarding findings and/or concerns. LCSW will upload in [place] and [place] PCP for review. [name] will send copy to [facility], as well  LCSW spoke with Candace with PCP office and requested verbal orders, per Elza Rafter, for [facility] RN and [facility] Aid. Informed her that similar request was made to [facility], due to PCP office scheduled to close today at 11:45 AM  [facility] Physical Therapist:  To whom it may concern:  re Beverly [name] dob Aug 28, 2045  I saw Beverly [name] for a [facility] evaluation on 02/05/22. She was in bed feeling very emotional and overwhelmed with her situation. She stated that she was told that she was to be transferred to a  SNF post surgery and did not have the care/ support she needed at home.   Her son lives and works in [place] and her dtr in [place]. Client c/o 6-8/10 pain in back with radiating symptoms onto bilat legs. She did not have her medications out of her bag that she had taken to the hosp and had not been taking all of her medications. She reports that she has not had a bm since Monday ( day of 1st surgery).  She has a complex medical hx and among other things she has cad s/p 2 stent placement in 3/23, OA, asthma, IBS, colitis, hypothryoidism, tracheobronchomalacia, sleep apnea, depression, chronic pain ( seen by pain clinic), common variable immunodeficiency.      She has an 5 yo neighbor who has come by to help her a little and another friend in town who works during the day. Otherwise client is by herself without any assistance.    ADL's : client req mod assist with LE dressing,  min assist UE dressing  ( once clothing laid out).  client req min assist with transfers  client displays shaking legs with being on her feet for very long and is unsteady toilet transfers min/ mod assist  - client does not have a bsc and no grab bars in the bathroom. She reports pulling up from the sink. This is against spine precautions/ restrictions provided by orthopedist. bathing   req mod assist with sponge bath.  meals  client unable to prepare meals safely on  a regular basis and unable to carry to place to set down if she could.   Dr Rolena Infante has expressed through his office that he does not provide PT for his spine patients until at least 6 wks. So PT would be unable to assist client with recovery.         SDOH assessments and interventions completed:  No     Care Coordination Interventions:  Yes, provided   Follow up plan: Follow up call scheduled for 1 week    Encounter Outcome:  Pt. Visit Completed   Christa See, MSW, [email or web address] Phone [phone] 1:38 PM

## 2022-02-06 NOTE — Patient Outreach (Signed)
  Care Coordination Late Entry  Follow Up Visit Note   Outreach completed 02/05/22 Name: LORALIE MALTA MRN: [id number] DOB: 1945/12/25  [name] is a 76 y.o. year old female who sees Isaac Bliss, Rayford Halsted, MD for primary care. I spoke with  [name] by phone today.  What matters to the patients health and wellness today?  Recommendations of HH PT    Goals Addressed             This Visit's Progress    Strengthen Support/Request for SNF Placement   On track    Care Coordination Interventions: Solution-Focused Strategies employed:  Active listening / Reflection utilized  Emotional Support Provided Consideration of in-home help encouraged : options discussed Verbalization of feelings encouraged  LCSW received incoming call from [facility] PT, Roque Lias [facility] PT endorses concerns that patient doesn't have any support for assistance throughout the day. States patient is unable to complete ADL's or safely ambulate around home [facility] PT shared that Dr. Rolena Infante (Orthopedics) doesn't approve of physical therapy for spinal patients. Feels she will need assistance with setting up medications and managing edema Patient shared she was not provided instructions on how to care for self. Feels it's too much for her to handle [facility] PT has reviewed recommendations with patient and agreed to inform [facility] of his concerns 02/06/22         SDOH assessments and interventions completed:  No     Care Coordination Interventions:  Yes, provided   Follow up plan: Follow up call scheduled for 02/06/22    Encounter Outcome:  Pt. Visit Completed   Christa See, MSW, [email or web address] Phone [phone] 1:07 PM

## 2022-02-10 ENCOUNTER — Ambulatory Visit: Payer: Self-pay | Admitting: Licensed Clinical Social Worker

## 2022-02-10 ENCOUNTER — Telehealth: Payer: Self-pay | Admitting: Internal Medicine

## 2022-02-10 DIAGNOSIS — Z09 Encounter for follow-up examination after completed treatment for conditions other than malignant neoplasm: Secondary | ICD-10-CM

## 2022-02-10 DIAGNOSIS — G8929 Other chronic pain: Secondary | ICD-10-CM

## 2022-02-10 NOTE — Telephone Encounter (Signed)
Ok to send verbal orders

## 2022-02-10 NOTE — Telephone Encounter (Addendum)
Beverly [name], SW with [facility]  [phone]  Pt is in dire need of [place]   Recently had back surgery, is all alone at home, cannot get out of the bed  Needs FL2 completed ASAP!!  Pt/SW Called [facility] and was told to reach out to PCP  SW informed PCP is out until 02/18/22.  * [place] went out on 02/05/22 - one day after discharge from hospital.  PT is very concerned about Pt's care - Pt cannot do PT in a home setting  SW is asking for any advise/assistance.

## 2022-02-10 NOTE — Telephone Encounter (Signed)
Dr Jerilee Hoh is out of the office until 02/18/22.

## 2022-02-10 NOTE — Telephone Encounter (Signed)
A referral was placed with a Education officer, museum to see if they can help.

## 2022-02-10 NOTE — Telephone Encounter (Signed)
Left detailed message on machine for Beverly [name]  with verbal orders, per Dr Legrand Como

## 2022-02-11 ENCOUNTER — Other Ambulatory Visit (HOSPITAL_COMMUNITY): Payer: Self-pay

## 2022-02-11 ENCOUNTER — Telehealth: Payer: Self-pay | Admitting: Licensed Clinical Social Worker

## 2022-02-11 NOTE — Patient Outreach (Signed)
  Care Coordination Late Entry  Follow Up Visit Note   Outreach completed 02/10/22 Name: CAIRA POCHE MRN: [id number] DOB: 1945-05-20  Beverly [name] is a 76 y.o. year old female who sees Isaac Bliss, Rayford Halsted, MD for primary care. I spoke with  Beverly [name] by phone today.  What matters to the patients health and wellness today?  SNF Placement    Goals Addressed             This Visit's Progress    Strengthen Support/Request for SNF Placement   On track    Care Coordination Interventions: Solution-Focused Strategies employed:  Active listening / Reflection utilized  Emotional Support Provided Consideration of in-home help encouraged : options discussed Verbalization of feelings encouraged  Patient reports her daughter and son in law visited over the weekend. States they have been helpful in preparing meals, assisting with ambulating, and bathing. Patient will return to having minimal support in the home, as they are leaving today Patient received a follow up call from [facility] today asking for an update. Patient is still interested in placement and [facility] informed pt that her PCP office will have to initiate placement LCSW informed PCP office of need for a FL2, despite PCP being out of the office until 02/18/22. They will speak with PCP's CMA for advisement and/or send message to clinical pool Patient does not have any preferences in [place] with Casimer Lanius, LCSW for assistance with completing FL2. LCSW submitted FL2 to Provider office via fax to be completed and returned, with a signed and dated med list, attached LCSW received confirmation of reciept  LCSW spoke with [name] at [facility]. Shared she has not received [facility] PT Evaluation; however, hopes to have it by Thursday, December 28, 23. Once obtained, she agreed to email a copy to LCSW  LCSW left a detailed message with [facility] Admissions, Sonnie Alamo  [phone] requesting a return call        SDOH assessments and interventions completed:  No     Care Coordination Interventions:  Yes, provided   Follow up plan: Follow up call scheduled for 1 week    Encounter Outcome:  Pt. Visit Completed   Christa See, MSW, [email or web address] Phone [phone] 8:20 AM

## 2022-02-11 NOTE — Patient Instructions (Signed)
Visit Information  Thank you for taking time to visit with me today. Please don't hesitate to contact me if I can be of assistance to you.   Following are the goals we discussed today:   Goals Addressed             This Visit's Progress    Strengthen Support/Request for SNF Placement   On track    Care Coordination Interventions: Solution-Focused Strategies employed:  Active listening / Reflection utilized  Emotional Support Provided Consideration of in-home help encouraged : options discussed Verbalization of feelings encouraged  Patient reports her daughter and son in law visited over the weekend. States they have been helpful in preparing meals, assisting with ambulating, and bathing. Patient will return to having minimal support in the home, as they are leaving today Patient received a follow up call from [facility] today asking for an update. Patient is still interested in placement and [facility] informed pt that her PCP office will have to initiate placement LCSW informed PCP office of need for a FL2, despite PCP being out of the office until 02/18/22. They will speak with PCP's CMA for advisement and/or send message to clinical pool Patient does not have any preferences in [place] with Casimer Lanius, LCSW for assistance with completing FL2. LCSW submitted FL2 to Provider office via fax to be completed and returned, with a signed and dated med list, attached LCSW received confirmation of reciept  LCSW spoke with [name] at [facility]. Shared she has not received [facility] PT Evaluation; however, hopes to have it by Thursday, December 28, 23. Once obtained, she agreed to email a copy to LCSW  LCSW left a detailed message with [facility] Admissions, Sonnie Alamo [phone] requesting a return call        Our next appointment is by telephone on 02/13/22 at 2 PM  Please call the care guide team at [phone] if you need to cancel or reschedule  your appointment.   If you are experiencing a Mental Health or [place] or need someone to talk to, please call the Suicide and Crisis Lifeline: 988 call 911   Patient verbalizes understanding of instructions and care plan provided today and agrees to view in [place]. Active MyChart status and patient understanding of how to access instructions and care plan via MyChart confirmed with patient.     Christa See, MSW, [email or web address] Phone [phone] 8:22 AM

## 2022-02-12 ENCOUNTER — Telehealth: Payer: Self-pay

## 2022-02-12 ENCOUNTER — Telehealth: Payer: Self-pay | Admitting: Licensed Clinical Social Worker

## 2022-02-12 NOTE — Telephone Encounter (Signed)
Patient's daughter calling to check on progress of this request. Wonders if another provider would be able to fill out this form. Please advise. [phone]

## 2022-02-12 NOTE — Patient Outreach (Signed)
  Care Coordination [facility] Note Transition Care Management Follow-up Telephone Call  Voicemail message received from patient. Return call placed to patient. She reports that this morning she was having increased leg pain not relieved by pain med. Pain has since subsided and improved. She is feeling better now per patient report. She voices that she reached out to surgeon office to alert them on pain. She states that she was advised that surgeon would be calling her back personally himself. Patient continues to voice difficulty managing care at home. Her neighbor is currently in the home during call and has been assisting her daily with meals and ADLs as needed. Family was in town for the holidays but has since left. Patient states that she got email notification that [facility] post discharge meals should arrive this afternoon between 12-4pm. Her neighbor will be able to retreive package and put meals up.She denies any other RN CM needs or concerns at this time.   Care Coordination Interventions:  Education provided    Encounter Outcome:  Pt. Visit Completed    Enzo Montgomery, RN,BSN,CCM [place] Management Telephonic Care Management Coordinator Direct Phone: [phone] Toll Free: [phone] Fax: [phone]

## 2022-02-12 NOTE — Telephone Encounter (Signed)
FL2 was placed on Dr Ledell Noss desk

## 2022-02-13 ENCOUNTER — Ambulatory Visit: Payer: Self-pay | Admitting: Licensed Clinical Social Worker

## 2022-02-13 ENCOUNTER — Telehealth: Payer: Self-pay | Admitting: *Deleted

## 2022-02-13 NOTE — Patient Instructions (Signed)
Visit Information  Thank you for taking time to visit with me today. Please don't hesitate to contact me if I can be of assistance to you.   Following are the goals we discussed today:   Goals Addressed             This Visit's Progress    Strengthen Support/Request for SNF Placement   On track    Care Coordination Interventions: Solution-Focused Strategies employed:  Active listening / Reflection utilized  Emotional Support Provided Consideration of in-home help encouraged : options discussed Verbalization of feelings encouraged  LCSW received a message from PCP office that there is no provider to complete FL2. It has been placed on PCP's desk to complete upon return from vacation LCSW spoke with patient and provided update. Patient endorses continued pain and inability to prepare a meal today. Meal delivery was supposed to begin today; however, pt is unaware, stating she is unable to get out of bed due to extreme pain Patient reports that [facility] PT was approved to complete 1 more visit and [place] RN will schedule appt via phone this week Patient has sent [facility] a message thru patient portal for advisement on pain management and safety concerns with no support in the home  LCSW spoke with Maudie Mercury, with [facility]. Was informed that Dr. Rolena Infante is in surgery all day and half a day 12/28. LCSW was provided their fax number [phone] to submit [place] for their review          Please call the care guide team at [phone] if you need to cancel or reschedule your appointment.   If you are experiencing a Mental Health or [place] or need someone to talk to, please call the Suicide and Crisis Lifeline: 988 call 911   Patient verbalizes understanding of instructions and care plan provided today and agrees to view in [place]. Active MyChart status and patient understanding of how to access instructions and care plan via MyChart confirmed with patient.     Christa See, MSW, [email or web address] Phone [phone] 11:17 PM

## 2022-02-13 NOTE — Patient Outreach (Signed)
  Care Coordination   Follow Up Visit Note   02/13/2022 Name: Beverly [name] MRN: [id number] DOB: [birth year]  Beverly [name] is a 76 y.o. year old female who sees Isaac Bliss, Rayford Halsted, MD for primary care. I spoke with  [name] by phone today.  What matters to the patients health and wellness today?  SNF Placement    Goals Addressed             This Visit's Progress    Strengthen Support/Request for SNF Placement   On track    Care Coordination Interventions: Solution-Focused Strategies employed:  Active listening / Reflection utilized  Emotional Support Provided Consideration of in-home help encouraged : options discussed Verbalization of feelings encouraged  LCSW LM for HH PT, Delila Pereyra at 11:42 AM Return call received. States he has discharge visit scheduled today. Spoke with pt 12/28 and was concerned that patient isn't feeling confident ambulating. States after today's visit, he has no additional orders. He will assess safety, discuss safe transfers, and getting around the home; however, results are dependent on pt's ability at the moment. Nurse wasn't able to visit pt today. Pt's family has obtained a temporary aid to strengthen support LCSW spoke with Gerald Stabs with [facility]. They received the FL2 and HH PT Eval LCSW received incoming call from [place], who confirmed faxed documents are being reviewed by Dr. Rolena Infante. She agreed to follow up with team on Tuesday, January 2, 24. LCSW informed her of instructions and encouraged Dr. Rolena Infante to state any special factors, regarding therapy,wound care, etc. No additional questions/concerns noted Outgoing call placed to patient. Patient shared that Dr. Rolena Infante contacted patient today. Patient informed him of interest in SNF and hopes to sign FL2. Has an upcoming appt with Dr. Rolena Infante next Tuesday to remove stiches.  Patient reports that she does not have any SNF preferences. Encouraged LCSW to contact various  agencies for placement Patient had temporary aid assist her with bathing, laundry, and changing bedding. She is unaware of how often aid will provide assistance, noting it will depend on how quickly she is placed in a SNF LCSW collaborated with leadership on 02/12/22 for advisement on strategies to advocate for patient        SDOH assessments and interventions completed:  No     Care Coordination Interventions:  Yes, provided   Follow up plan: Follow up call scheduled for 1 week    Encounter Outcome:  Pt. Visit Completed   Christa See, MSW, [email or web address] Phone [phone] 11:39 PM

## 2022-02-13 NOTE — Patient Outreach (Signed)
  Care Coordination Late Entry  Follow Up Visit Note   Outreach completed 02/12/22 Name: Beverly [name] MRN: [id number] DOB: Jun 26, 1945  Beverly [name] is a 76 y.o. year old female who sees Isaac Bliss, Rayford Halsted, MD for primary care. I spoke with  Beverly [name] by phone today.  What matters to the patients health and wellness today?  SNF Placement    Goals Addressed             This Visit's Progress    Strengthen Support/Request for SNF Placement   On track    Care Coordination Interventions: Solution-Focused Strategies employed:  Active listening / Reflection utilized  Emotional Support Provided Consideration of in-home help encouraged : options discussed Verbalization of feelings encouraged  LCSW received a message from PCP office that there is no provider to complete FL2. It has been placed on PCP's desk to complete upon return from vacation LCSW spoke with patient and provided update. Patient endorses continued pain and inability to prepare a meal today. Meal delivery was supposed to begin today; however, pt is unaware, stating she is unable to get out of bed due to extreme pain Patient reports that [facility] PT was approved to complete 1 more visit and [place] RN will schedule appt via phone this week Patient has sent [facility] a message thru patient portal for advisement on pain management and safety concerns with no support in the home  LCSW spoke with Maudie Mercury, with [facility]. Was informed that Dr. Rolena Infante is in surgery all day and half a day 12/28. LCSW was provided their fax number [phone] to submit [place] for their review          SDOH assessments and interventions completed:  No     Care Coordination Interventions:  Yes, provided   Follow up plan: Follow up call scheduled for within a week    Encounter Outcome:  Pt. Visit Completed   Christa See, MSW, [email or web address] Phone [phone] 11:25 PM

## 2022-02-13 NOTE — Patient Outreach (Addendum)
  Care Coordination Late Entry  Follow Up Visit Note   Outreach completed 02/11/22 Name: Beverly [name] MRN: [id number] DOB: [birth year]  [name] is a 76 y.o. year old female who sees Isaac Bliss, Rayford Halsted, MD for primary care. I spoke with  [name] by phone today.  What matters to the patients health and wellness today?  SNF Placement    Goals Addressed             This Visit's Progress    Strengthen Support/Request for SNF Placement   On track    Care Coordination Interventions: Solution-Focused Strategies employed:  Active listening / Reflection utilized  Emotional Support Provided Consideration of in-home help encouraged : options discussed Verbalization of feelings encouraged  LCSW spoke with pt's daughter, Ander Purpura. Provided update that we are awaiting news about whether covering provider will complete FL2. Agreed to keep she and pt informed with any additional information obtained           SDOH assessments and interventions completed:  No     Care Coordination Interventions:  Yes, provided   Follow up plan: Follow up call scheduled for within the week    Encounter Outcome:  Pt. Visit Completed   Christa See, MSW, [email or web address] Phone [phone] 11:23 PM

## 2022-02-13 NOTE — Patient Outreach (Signed)
  Care Coordination   02/13/2022 Name: Beverly [name] MRN: [id number] DOB: 1946-02-10   Care Coordination Outreach Attempts:  An unsuccessful telephone outreach was attempted today to offer the patient information about available care coordination services as a benefit of their health plan.   Follow Up Plan:  Additional outreach attempts will be made to offer the patient care coordination information and services.   Encounter Outcome:  No Answer   Care Coordination Interventions:  No, not indicated    Raina Mina, RN Care Management Coordinator [place] Office [phone]

## 2022-02-13 NOTE — Patient Instructions (Signed)
Visit Information  Thank you for taking time to visit with me today. Please don't hesitate to contact me if I can be of assistance to you.   Following are the goals we discussed today:   Goals Addressed             This Visit's Progress    Strengthen Support/Request for SNF Placement   On track    Care Coordination Interventions: Solution-Focused Strategies employed:  Active listening / Reflection utilized  Emotional Support Provided Consideration of in-home help encouraged : options discussed Verbalization of feelings encouraged  LCSW LM for HH PT, Delila Pereyra at 11:42 AM Return call received. States he has discharge visit scheduled today. Spoke with pt 12/28 and was concerned that patient isn't feeling confident ambulating. States after today's visit, he has no additional orders. He will assess safety, discuss safe transfers, and getting around the home; however, results are dependent on pt's ability at the moment. Nurse wasn't able to visit pt today. Pt's family has obtained a temporary aid to strengthen support LCSW spoke with Gerald Stabs with [facility]. They received the FL2 and HH PT Eval LCSW received incoming call from [place], who confirmed faxed documents are being reviewed by Dr. Rolena Infante. She agreed to follow up with team on Tuesday, January 2, 24. LCSW informed her of instructions and encouraged Dr. Rolena Infante to state any special factors, regarding therapy,wound care, etc. No additional questions/concerns noted Outgoing call placed to patient. Patient shared that Dr. Rolena Infante contacted patient today. Patient informed him of interest in SNF and hopes to sign FL2. Has an upcoming appt with Dr. Rolena Infante next Tuesday to remove stiches.  Patient reports that she does not have any SNF preferences. Encouraged LCSW to contact various agencies for placement Patient had temporary aid assist her with bathing, laundry, and changing bedding. She is unaware of how often aid will provide assistance,  noting it will depend on how quickly she is placed in a SNF LCSW collaborated with leadership on 02/12/22 for advisement on strategies to advocate for patient       Please call the care guide team at [phone] if you need to cancel or reschedule your appointment.   If you are experiencing a Mental Health or [place] or need someone to talk to, please call the Suicide and Crisis Lifeline: 988 call 911   Patient verbalizes understanding of instructions and care plan provided today and agrees to view in [place]. Active MyChart status and patient understanding of how to access instructions and care plan via MyChart confirmed with patient.     Christa See, MSW, [email or web address] Phone [phone] 11:39 PM

## 2022-02-13 NOTE — Patient Instructions (Signed)
Visit Information  Thank you for taking time to visit with me today. Please don't hesitate to contact me if I can be of assistance to you.   Following are the goals we discussed today:   Goals Addressed             This Visit's Progress    Strengthen Support/Request for SNF Placement   On track    Care Coordination Interventions: Solution-Focused Strategies employed:  Active listening / Reflection utilized  Emotional Support Provided Consideration of in-home help encouraged : options discussed Verbalization of feelings encouraged  LCSW received a message from PCP office that there is no provider to complete FL2. It has been placed on PCP's desk to complete upon return from vacation LCSW spoke with patient and provided update. Patient endorses continued pain and inability to prepare a meal today. Meal delivery was supposed to begin today; however, pt is unaware, stating she is unable to get out of bed due to extreme pain Patient reports that [facility] PT was approved to complete 1 more visit and [place] RN will schedule appt via phone this week Patient has sent [facility] a message thru patient portal for advisement on pain management and safety concerns with no support in the home  LCSW spoke with Maudie Mercury, with [facility]. Was informed that Dr. Rolena Infante is in surgery all day and half a day 12/28. LCSW was provided their fax number [phone] to submit [place] for their review         Please call the care guide team at [phone] if you need to cancel or reschedule your appointment.   If you are experiencing a Mental Health or [place] or need someone to talk to, please call the Suicide and Crisis Lifeline: 988 call 911   Patient verbalizes understanding of instructions and care plan provided today and agrees to view in [place]. Active MyChart status and patient understanding of how to access instructions and care plan via MyChart confirmed with patient.     Christa See, MSW, [email or web address] Phone [phone] 11:25 PM

## 2022-02-18 ENCOUNTER — Telehealth: Payer: Self-pay | Admitting: Licensed Clinical Social Worker

## 2022-02-18 ENCOUNTER — Other Ambulatory Visit (HOSPITAL_COMMUNITY): Payer: Self-pay

## 2022-02-18 NOTE — Patient Outreach (Signed)
  Care Coordination   Follow Up Visit Note   02/18/2022 Name: Beverly [name] MRN: [id number] DOB: 1945-11-26  Beverly [name] is a 77 y.o. year old female who sees Isaac Bliss, Rayford Halsted, MD for primary care. I spoke with  [name] by phone today.  What matters to the patients health and wellness today?  SNF Placement    Goals Addressed             This Visit's Progress    Strengthen Support/Request for SNF Placement   On track    Care Coordination Interventions: Solution-Focused Strategies employed:  Active listening / Reflection utilized  Emotional Support Provided Consideration of in-home help encouraged : options discussed Verbalization of feelings encouraged  LCSW LM for HH PT, Delila Pereyra at 11:42 AM Return call received. States he has discharge visit scheduled today. Spoke with pt 12/28 and was concerned that patient isn't feeling confident ambulating. States after today's visit, he has no additional orders. He will assess safety, discuss safe transfers, and getting around the home; however, results are dependent on pt's ability at the moment. Nurse wasn't able to visit pt today. Pt's family has obtained a temporary aid to strengthen support LCSW spoke with Gerald Stabs with [facility]. They received the FL2 and HH PT Eval LCSW received incoming call from [place], who confirmed faxed documents are being reviewed by Dr. Rolena Infante. She agreed to follow up with team on Tuesday, January 2, 24. LCSW informed her of instructions and encouraged Dr. Rolena Infante to state any special factors, regarding therapy,wound care, etc. No additional questions/concerns noted Outgoing call placed to patient. Patient shared that Dr. Rolena Infante contacted patient today. Patient informed him of interest in SNF and hopes to sign FL2. Has an upcoming appt with Dr. Rolena Infante next Tuesday to remove stiches.  Patient reports that she does not have any SNF preferences. Encouraged LCSW to contact various agencies  for placement Patient had temporary aid assist her with bathing, laundry, and changing bedding. She is unaware of how often aid will provide assistance, noting it will depend on how quickly she is placed in a SNF LCSW collaborated with leadership on 02/12/22 for advisement on strategies to advocate for patient        SDOH assessments and interventions completed:  No     Care Coordination Interventions:  Yes, provided   Follow up plan: Follow up call scheduled for within 1 week    Encounter Outcome:  Pt. Visit Completed   Christa See, MSW, [email or web address] Phone [phone] 5:33 PM

## 2022-02-18 NOTE — Patient Instructions (Signed)
Visit Information  Thank you for taking time to visit with me today. Please don't hesitate to contact me if I can be of assistance to you.   Following are the goals we discussed today:   Goals Addressed             This Visit's Progress    Strengthen Support/Request for SNF Placement   On track    Care Coordination Interventions: Solution-Focused Strategies employed:  Active listening / Reflection utilized  Emotional Support Provided Consideration of in-home help encouraged : options discussed Verbalization of feelings encouraged  LCSW LM for HH PT, Delila Pereyra at 11:42 AM Return call received. States he has discharge visit scheduled today. Spoke with pt 12/28 and was concerned that patient isn't feeling confident ambulating. States after today's visit, he has no additional orders. He will assess safety, discuss safe transfers, and getting around the home; however, results are dependent on pt's ability at the moment. Nurse wasn't able to visit pt today. Pt's family has obtained a temporary aid to strengthen support LCSW spoke with Gerald Stabs with [facility]. They received the FL2 and HH PT Eval LCSW received incoming call from [place], who confirmed faxed documents are being reviewed by Dr. Rolena Infante. She agreed to follow up with team on Tuesday, January 2, 24. LCSW informed her of instructions and encouraged Dr. Rolena Infante to state any special factors, regarding therapy,wound care, etc. No additional questions/concerns noted Outgoing call placed to patient. Patient shared that Dr. Rolena Infante contacted patient today. Patient informed him of interest in SNF and hopes to sign FL2. Has an upcoming appt with Dr. Rolena Infante next Tuesday to remove stiches.  Patient reports that she does not have any SNF preferences. Encouraged LCSW to contact various agencies for placement Patient had temporary aid assist her with bathing, laundry, and changing bedding. She is unaware of how often aid will provide assistance,  noting it will depend on how quickly she is placed in a SNF LCSW collaborated with leadership on 02/12/22 for advisement on strategies to advocate for patient        Please call the care guide team at [phone] if you need to cancel or reschedule your appointment.   If you are experiencing a Mental Health or [place] or need someone to talk to, please call the Suicide and Crisis Lifeline: 988 call 911   Patient verbalizes understanding of instructions and care plan provided today and agrees to view in [place]. Active MyChart status and patient understanding of how to access instructions and care plan via MyChart confirmed with patient.     Christa See, MSW, [email or web address] Phone [phone] 5:34 PM

## 2022-02-18 NOTE — Telephone Encounter (Signed)
Form faxed and confirmed

## 2022-02-19 ENCOUNTER — Telehealth: Payer: Self-pay

## 2022-02-19 ENCOUNTER — Telehealth: Payer: Self-pay | Admitting: Licensed Clinical Social Worker

## 2022-02-19 NOTE — Patient Outreach (Signed)
  Care Coordination   Follow Up Visit Note   02/19/2022 Name: Beverly [name] MRN: [id number] DOB: 1945-10-26  Beverly [name] is a 77 y.o. year old female who sees Isaac Bliss, Rayford Halsted, MD for primary care. I spoke with  [name] by phone today.  What matters to the patients health and wellness today?  SNF Placement    Goals Addressed             This Visit's Progress    Strengthen Support/Request for SNF Placement   On track    Care Coordination Interventions: Solution-Focused Strategies employed:  Active listening / Reflection utilized  Emotional Support Provided Consideration of in-home help encouraged : options discussed Verbalization of feelings encouraged  LCSW received Fl2s from [facility] and [facility] LCSW searched for local skilled nursing facilities through [employer] website LCSW submitted FL2, H&P, and HH PT Eval to [facility] and [place] for review LCSW spoke with and/or left messages regarding availability. [facility] has no availability LCSW is awaiting a return call from the following:            -[place] [facility] [place] and [place] and [place] LCSW provided update to patient and adult daughter. Provided link and listing via email, per request. Pt is not interested in placement at [facility], will rescind, if offer is obtained        SDOH assessments and interventions completed:  No     Care Coordination Interventions:  Yes, provided   Follow up plan: Follow up call scheduled for within 1 week    Encounter Outcome:  Pt. Visit Completed   Christa See, MSW, [email or web address] Phone [phone] 4:47 PM

## 2022-02-19 NOTE — Telephone Encounter (Signed)
Copy emailed to Christa See, LCSW

## 2022-02-19 NOTE — Patient Outreach (Addendum)
  Care Coordination [facility] Note Transition Care Management Follow-up Telephone Call  Voicemail message received from patient. Return call to patient. She voices that she had been trying tor each assigned  SW but was unable to get through-getting message that number ws not in service. RN CM verified that patient was calling correct number. She reports she was finally able to talk to SW yesterday and update her that ortho MD(Dr. Rolena Infante) said he was completing FL2 and sending it to SW. Patient very anxious and waiting to know if FL2 had been received yet. Advised patient that RN CM would discuss with SW and have someone follow up with her. No further needs or concerns at this time. She voices that her daughter has been in town staying with her some and took her to follow up MD appt.     Enzo Montgomery, RN,BSN,CCM [place] [facility] Telephonic Care Management Coordinator Direct Phone: [phone] Toll Free: [phone] Fax: [phone]

## 2022-02-19 NOTE — Patient Instructions (Signed)
Visit Information  Thank you for taking time to visit with me today. Please don't hesitate to contact me if I can be of assistance to you.   Following are the goals we discussed today:   Goals Addressed             This Visit's Progress    Strengthen Support/Request for SNF Placement   On track    Care Coordination Interventions: Solution-Focused Strategies employed:  Active listening / Reflection utilized  Emotional Support Provided Consideration of in-home help encouraged : options discussed Verbalization of feelings encouraged  LCSW received Fl2s from [facility] and [facility] LCSW searched for local skilled nursing facilities through [place] website LCSW submitted FL2, H&P, and HH PT Eval to [facility] and [place] for review LCSW spoke with and/or left messages regarding availability. [facility] has no availability LCSW is awaiting a return call from the following:            -[place] [facility] [place] and [place] and [place] LCSW provided update to patient and adult daughter. Provided link and listing via email, per request. Pt is not interested in placement at [facility], will rescind, if offer is obtained        Please call the care guide team at [phone] if you need to cancel or reschedule your appointment.   If you are experiencing a Mental Health or [place] or need someone to talk to, please call the Suicide and Crisis Lifeline: 988 call 911   Patient verbalizes understanding of instructions and care plan provided today and agrees to view in [place]. Active MyChart status and patient understanding of how to access instructions and care plan via MyChart confirmed with patient.     Christa See, MSW, [email or web address] Phone [phone] 4:48 PM

## 2022-02-23 ENCOUNTER — Telehealth: Payer: Self-pay | Admitting: Licensed Clinical Social Worker

## 2022-02-23 NOTE — Patient Outreach (Signed)
  Care Coordination   Multidisciplinary Case Review Note    02/23/2022 Name: Beverly [name] MRN: [id number] DOB: Dec 02, 1945  Karene Fry Banka is a 77 y.o. year old female who sees Isaac Bliss, Rayford Halsted, MD for primary care.  The  multidisciplinary care team met today to review patient care needs and barriers.    Goals addressed: LCSW received communication of pt's preferred facilities, including, [facility],  [place], [facility] and [place], [facility], [address] [place] (no beds available), and [facility] will attempt to make contact with Admission Coordinators of above facilities. Family are encouraged to make contact with facilities, as well Discuss safety plan Re-assess for SDOH  SDOH assessments and interventions completed:  No     Care Coordination Interventions Activated:  Yes   Care Coordination Interventions:  Yes, provided   Follow up plan: Follow up call scheduled for 02/23/22    Multidisciplinary Team Attendees:   Raina Mina, RN Enzo Montgomery, [facility] RN Daneen Schick, BSW Christa See, [place]  Scribe for Multidisciplinary Case Review:  Christa See, MSW, [email or web address] Phone [phone] 10:37 AM

## 2022-02-24 ENCOUNTER — Encounter: Payer: Self-pay | Admitting: Allergy and Immunology

## 2022-02-24 ENCOUNTER — Other Ambulatory Visit: Payer: Self-pay

## 2022-02-24 ENCOUNTER — Ambulatory Visit (INDEPENDENT_AMBULATORY_CARE_PROVIDER_SITE_OTHER): Payer: Medicare PPO | Admitting: Allergy and Immunology

## 2022-02-24 ENCOUNTER — Other Ambulatory Visit (HOSPITAL_COMMUNITY): Payer: Self-pay

## 2022-02-24 VITALS — BP 116/68 | HR 67 | Temp 98.0°F | Resp 18 | Ht 63.0 in | Wt 152.9 lb

## 2022-02-24 DIAGNOSIS — Z8709 Personal history of other diseases of the respiratory system: Secondary | ICD-10-CM

## 2022-02-24 DIAGNOSIS — D839 Common variable immunodeficiency, unspecified: Secondary | ICD-10-CM

## 2022-02-24 DIAGNOSIS — K219 Gastro-esophageal reflux disease without esophagitis: Secondary | ICD-10-CM

## 2022-02-24 DIAGNOSIS — K52832 Lymphocytic colitis: Secondary | ICD-10-CM | POA: Diagnosis not present

## 2022-02-24 MED ORDER — FAMOTIDINE 40 MG PO TABS
40.0000 mg | ORAL_TABLET | Freq: Every evening | ORAL | 1 refills | Status: DC
  Filled 2022-02-24 – 2022-06-04 (×2): qty 90, 90d supply, fill #0

## 2022-02-24 MED ORDER — PANTOPRAZOLE SODIUM 40 MG PO TBEC
40.0000 mg | DELAYED_RELEASE_TABLET | Freq: Every morning | ORAL | 1 refills | Status: DC
  Filled 2022-02-24: qty 90, 90d supply, fill #0
  Filled 2022-06-04: qty 90, 90d supply, fill #1

## 2022-02-24 MED ORDER — MONTELUKAST SODIUM 10 MG PO TABS
10.0000 mg | ORAL_TABLET | Freq: Every day | ORAL | 5 refills | Status: DC
  Filled 2022-02-24: qty 30, 30d supply, fill #0

## 2022-02-24 MED ORDER — LORATADINE 10 MG PO TABS
10.0000 mg | ORAL_TABLET | Freq: Every day | ORAL | 1 refills | Status: DC | PRN
  Filled 2022-02-24: qty 180, 180d supply, fill #0

## 2022-02-24 MED ORDER — ALBUTEROL SULFATE HFA 108 (90 BASE) MCG/ACT IN AERS
2.0000 | INHALATION_SPRAY | RESPIRATORY_TRACT | 1 refills | Status: DC | PRN
  Filled 2022-02-24: qty 6.7, 17d supply, fill #0

## 2022-02-24 MED ORDER — IPRATROPIUM BROMIDE 0.06 % NA SOLN
2.0000 | Freq: Four times a day (QID) | NASAL | 5 refills | Status: DC
  Filled 2022-02-24: qty 15, 10d supply, fill #0
  Filled 2022-07-20: qty 15, 10d supply, fill #1

## 2022-02-24 MED ORDER — TRIAMCINOLONE ACETONIDE 55 MCG/ACT NA AERO
2.0000 | INHALATION_SPRAY | Freq: Every day | NASAL | 5 refills | Status: DC
  Filled 2022-02-24: qty 16.9, fill #0

## 2022-02-24 NOTE — Patient Instructions (Addendum)
  1.  Continue immunoglobulin infusions every week  2.  Continue to treat reflex / LPR:   A.  Pantoprazole 40 mg in AM  B.  Famotidine 40 mg in PM   3.  Treat and prevent inflammation of upper airway:   A.  Montelukast 10 mg -1 tablet 1 time per day  B.  OTC Nasacort -1 spray each nostril 3-7 times per week  4. If needed:  A. Loratadine 10 mg 1 tablet 1 time per day  B. Ipratropium 0.06% - 2 sprays each nostril every 6 hours (4x/day) C. Nasal saline D. Albuterol HFA - 2 inhalations every 4-6 hours  5. Blood - IgG  6. Continue budesonide therapy for lymphocytic colitis  7.  Return to clinic in 6 months or earlier if problem

## 2022-02-24 NOTE — Progress Notes (Unsigned)
Beverly [name]   Follow-up Note  Referring Provider: Isaac Bliss, [place] [facility]* Primary Provider: Isaac Bliss, Rayford Halsted, MD Date of Office Visit: 02/24/2022  Subjective:   Beverly [name] (DOB: Oct 27, 1945) is a 77 y.o. female who returns to the [facility] [place] on 02/24/2022 in re-evaluation of the following:  HPI: Beverly [name] returns to this clinic in evaluation of CVID with bronchiectasis, history of Nocardia pneumonia, history of asthma, history of reflux, history of rhinitis, history of lymphocytic colitis.  I last saw her in this clinic 12 August 2021.  She has had a few viral respiratory tract infections but no other significant infections involving her respiratory tract or other organ systems while she continues to receive immunoglobulin infusions without any adverse effect.  She does use a nasal steroid but occasionally has epistaxis and she is backed off on her nasal steroid.  Occasionally she will use nasal ipratropium and nasal saline.  She has had no need to use any albuterol although her lungs did get inflamed with coughing and sputum production when she had anesthesia for her back surgery middle of December 2023 but that issue has since resolved.  She continues on oral budesonide at 3 mg a day and colestipol 1 g a day for her lymphocytic colitis which is working very well in controlling her chronic diarrhea.  She has received the flu vaccine, RSV vaccine, and COVID-vaccine.  Allergies as of 02/24/2022       Reactions   Adhesive [tape] Rash   Blister    Codeine Rash        Medication List    albuterol 108 (90 Base) MCG/ACT inhaler Commonly known as: Ventolin HFA Inhale 2 puffs into the lungs every 6 (six) hours as needed for wheezing or shortness of breath.   ARTIFICIAL TEAR OP Place 2 drops into both eyes in the morning and at bedtime.   atorvastatin 40 MG tablet Commonly known as: LIPITOR Take 1 tablet  (40 mg total) by mouth daily.   Azelastine HCl 137 MCG/SPRAY Soln Place 2 sprays into both nostrils 2 (two) times daily. Use in each nostril as directed   Belbuca 750 MCG Film Generic drug: Buprenorphine HCl Place 750 mcg film inside cheek to dissolve 2 (two) times daily.   Belbuca 750 MCG Film Generic drug: Buprenorphine HCl Place 1 Film inside cheek 2 (two) times daily.   budesonide 3 MG 24 hr capsule Commonly known as: ENTOCORT EC Take 1 capsule (3 mg total) by mouth daily.   Calcium Carbonate-Vitamin D 600-200 MG-UNIT Tabs Take 1 tablet by mouth 2 (two) times daily.   clopidogrel 75 MG tablet Commonly known as: PLAVIX Take 1 tablet (75 mg total) by mouth daily.   colestipol 1 g tablet Commonly known as: COLESTID Take 1 tablet by mouth daily.   Culturelle Adult Ult Balance Caps Take 1 capsule by mouth daily.   Docusate Sodium 100 MG capsule Take 100 mg by mouth in the morning and at bedtime.   EPINEPHrine 0.3 mg/0.3 mL Soaj injection Commonly known as: EPI-PEN Inject 0.3 mg into the muscle as needed for anaphylaxis.   famotidine 40 MG tablet Commonly known as: Pepcid Take 1 tablet (40 mg total) by mouth at bedtime.   FIBER PO Take 2 tablets by mouth in the morning and at bedtime.   gabapentin 800 MG tablet Commonly known as: NEURONTIN Take 1 tablet by mouth at bedtime.   guaiFENesin 600 MG 12  hr tablet Commonly known as: MUCINEX Take 600 mg by mouth 2 (two) times daily.   ipratropium 0.03 % nasal spray Commonly known as: ATROVENT Place 1 spray into both nostrils 3 (three) times daily as needed for rhinitis.   levocetirizine 5 MG tablet Commonly known as: XYZAL Take 1 tablet (5 mg total) by mouth every evening. What changed: when to take this   levothyroxine 25 MCG tablet Commonly known as: SYNTHROID Take 1 tablet (25 mcg total) by mouth daily.   melatonin 5 MG Tabs Take 5 mg by mouth at bedtime.   montelukast 10 MG tablet Commonly known as:  SINGULAIR Take 1 tablet (10 mg total) by mouth daily.   nitroGLYCERIN 0.4 MG SL tablet Commonly known as: Nitrostat Place 1 tablet (0.4 mg total) under the tongue every 5 (five) minutes as needed.   omega-3 acid ethyl esters 1 g capsule Commonly known as: LOVAZA Take 2 capsules (2 g total) by mouth 2 (two) times daily.   ondansetron 4 MG tablet Commonly known as: Zofran Take 1 tablet (4 mg total) by mouth every 8 (eight) hours as needed for nausea or vomiting.   pantoprazole 40 MG tablet Commonly known as: PROTONIX Take 1 tablet (40 mg) twice daily for 10 weeks then once a day as per GI recommendations What changed: when to take this   PARoxetine 40 MG tablet Commonly known as: PAXIL Take 40 mg by mouth at bedtime.   polyethylene glycol 17 g packet Commonly known as: MIRALAX / GLYCOLAX Take 17 g by mouth daily.   psyllium 58.6 % packet Commonly known as: METAMUCIL Take 1 packet by mouth daily.   triamcinolone 55 MCG/ACT Aero nasal inhaler Commonly known as: NASACORT Place 2 sprays into each nostril once daily.   TUMS PO Take 1 tablet by mouth as needed (heartburn).   VITAMIN B-12 PO Take 1 capsule by mouth daily.   Xembify 10 GM/50ML Soln Generic drug: Immune Globulin (Human)-klhw Inject 10 g into the skin once a week. 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   right sholder   BCC (basal cell carcinoma of skin) 05/09/2014   mid lower back   BCC (basal cell carcinoma of skin) 05/03/2017   right low back   BCC (basal cell carcinoma of skin) 05/03/2017   left upper back   BCC (basal cell carcinoma of skin) 07/05/2018   left mid back   BCC (basal cell carcinoma of skin) 05/20/1992   upper back   BCC  (basal cell carcinoma of skin) 07/29/1993   left sholder medial   BCC (basal cell carcinoma of skin) 07/29/1993   left sholder lateral   BCC (basal cell carcinoma of skin) 07/29/1993   right thigh   BCC (basal cell carcinoma of skin) 07/29/1993   right sholder   BCC (basal cell carcinoma of skin) 12/22/1994   right mid forearm   BCC (basal cell carcinoma of skin) 12/22/1994   right upper forearm   BCC (basal cell carcinoma of skin) 12/22/1994   lower right upper arm   BCC (basal cell carcinoma of skin) 12/22/1994   right upper arm sholder   BCC (  basal cell carcinoma of skin) 08/11/1995   left leg below knee   BCC (basal cell carcinoma of skin) 04/11/2002   mid back   BCC (basal cell carcinoma of skin) 12/10/2002   right center upper back   BCC (basal cell carcinoma of skin) 05/26/2005   right post sholder   BCC (basal cell carcinoma) 05/09/2014   left inner shin   BCC (basal cell carcinoma) 06/12/2014   left forearm   Bowen's disease 10/07/1994   right post knee, right inner forearm/wrist   Bowen's disease 08/11/1995   left sholder   Bronchiectasis ([place])    Cataract    left   Chronic pain    Common variable immunodeficiency [facility])    sees dr Gayleen Orem 02-11-2021 epic   Coronary artery disease    Depression    hx of   Diverticulosis of colon 1998   mild   DJD (degenerative joint disease)    Duodenal ulcer    h/o yrs ago   GERD (gastroesophageal reflux disease)    History of SCC (squamous cell carcinoma) of skin    Dr. Denna Haggard   History of sinus bradycardia    HLD (hyperlipidemia)    hypertriglyceridemia   Hypertensive retinopathy of both eyes 11/18/2017   Hypothyroidism    IBS (irritable bowel syndrome)    Internal hemorrhoids 1998   Lymphocytic colitis    Mitral regurgitation    mild   Nocardiosis    relased by infection disease dec 2022   Osteoarthritis    feet,shoulder,neck,back,hips and hands.   Pneumonia 2015   PONV (postoperative nausea and  vomiting)    Rotator cuff tear, right 02/2019   infraspinatus and supraspinatus, and dislocation of long head of bicep tendon (Dr. Alvan Dame)   SCC (squamous cell carcinoma) 11/26/2014   left hand, right hand, right deltoid mnay areas   SCC (squamous cell carcinoma) 05/03/2017   left cheek   Sleep apnea    uses a mouth guard nightly   Squamous cell carcinoma in situ (SCCIS) 07/05/2018   left hand   Tracheobronchomalacia    Vitamin D deficiency    mild    Past Surgical History:  Procedure Laterality Date   ABDOMINAL EXPOSURE N/A 02/02/2022   Procedure: ABDOMINAL EXPOSURE;  Surgeon: Marty Heck, MD;  Location[facility];  Service: Vascular;  Laterality: N/A;   [place]   complete   BIOPSY  11/05/2021   Procedure: BIOPSY;  Surgeon: Thornton Park, MD;  Location: [place];  Service: Gastroenterology;;   BLEPHAROPLASTY Bilateral 01/2018   BROW LIFT Bilateral 10/10/2018   Procedure: Delphina Cahill LIFT;  Surgeon: Wallace Going, DO;  Location: [place];  Service: Plastics;  Laterality: Bilateral;   BUNIONECTOMY     R 12/08, L 2004 (Dr. Janus Molder)   [place]   bilateral   CARPAL TUNNEL RELEASE Left 03/06/2021   Procedure: Left Revision Carpal Tunnel Release with hypothenar fat pad flap;  Surgeon: [name], MD;  Location: [place];  Service: Orthopedics;  Laterality: Left;  with local anesthesia   CATARACT EXTRACTION Bilateral Left in 08/2012, Right 04/2016   Dr.Hecker   CESAREAN SECTION     1981 and [place]   laparoscopic   CLOSED REDUCTION NASAL FRACTURE N/A 08/29/2018   Procedure: CLOSED REDUCTION NASAL FRACTURE;  Surgeon: Wallace Going, DO;  Location: [place];  Service: Plastics;  Laterality: N/A;  1 hour, please   COLONOSCOPY  2006   COLONOSCOPY  01/2012   due again 01/2021; mild diverticulosis   CORONARY STENT  INTERVENTION N/A 05/19/2021   Procedure: CORONARY STENT INTERVENTION;  Surgeon: Nelva Bush, MD;  Location: [place] CV LAB;  Service: Cardiovascular;  Laterality: N/A;   epidural steroid injection, back  02/2010   ESOPHAGOGASTRODUODENOSCOPY (EGD) WITH PROPOFOL N/A 11/05/2021   Procedure: ESOPHAGOGASTRODUODENOSCOPY (EGD) WITH PROPOFOL;  Surgeon: Thornton Park, MD;  Location: [place];  Service: Gastroenterology;  Laterality: N/A;   HIP SURGERY     right bursectomy x 3   HIP SURGERY Right 2000   torn cartilage, repaired   INGUINAL HERNIA REPAIR  10/2007   bilat   INTRAVASCULAR ULTRASOUND/IVUS N/A 05/19/2021   Procedure: Intravascular Ultrasound/IVUS;  Surgeon: Nelva Bush, MD;  Location: [place] CV LAB;  Service: Cardiovascular;  Laterality: N/A;   LEFT HEART CATH AND CORONARY ANGIOGRAPHY N/A 05/19/2021   Procedure: LEFT HEART CATH AND CORONARY ANGIOGRAPHY;  Surgeon: Nelva Bush, MD;  Location: [place] CV LAB;  Service: Cardiovascular;  Laterality: N/A;   NECK SURGERY  1989   c6-7 cervical laminectomy and diskecotmy   REVERSE SHOULDER ARTHROPLASTY Right 04/11/2019   Procedure: REVERSE SHOULDER ARTHROPLASTY;  Surgeon: Nicholes Stairs, MD;  Location: [place];  Service: Orthopedics;  Laterality: Right;  Regional Block   SHOULDER SURGERY Left 03/2003   left rotator cuff repair   SHOULDER SURGERY Left 03/14/2018   rotator cuff repair; Dr. Tonita Cong   TONSILLECTOMY  age 78   TROCHANTERIC BURSA EXCISION Right [place]   UPPER GASTROINTESTINAL ENDOSCOPY     WISDOM TOOTH EXTRACTION     as teenager    Review of systems negative except as noted in HPI / PMHx or noted below:  Review of Systems  Constitutional: Negative.   HENT: Negative.    Eyes: Negative.   Respiratory: Negative.    Cardiovascular: Negative.   Gastrointestinal: Negative.   Genitourinary: Negative.   Musculoskeletal: Negative.   Skin: Negative.   Neurological:  Negative.   Endo/Heme/Allergies: Negative.   Psychiatric/Behavioral: Negative.       Objective:   Vitals:   02/24/22 1113  BP: 116/68  Pulse: 67  Resp: 18  Temp: 98 F (36.7 C)  SpO2: 98%   Height: '5\' 3"'$  (160 cm)  Weight: 152 lb 14.4 oz (69.4 kg)   Physical Exam Constitutional:      Appearance: She is not diaphoretic.  HENT:     Head: Normocephalic.     Right Ear: Tympanic membrane, ear canal and external ear normal.     Left Ear: Tympanic membrane, ear canal and external ear normal.     Nose: Nose normal. No mucosal edema or rhinorrhea.     Mouth/Throat:     Pharynx: Uvula midline. No oropharyngeal exudate.  Eyes:     Conjunctiva/sclera: Conjunctivae normal.  Neck:     Thyroid: No thyromegaly.     Trachea: Trachea normal. No tracheal tenderness or tracheal deviation.  Cardiovascular:     Rate and Rhythm: Normal rate and regular rhythm.     Heart sounds: Normal heart sounds, S1 normal and S2 normal. No murmur heard. Pulmonary:     Effort: No respiratory distress.     Breath sounds: Normal breath sounds. No stridor. No wheezing or rales.  Lymphadenopathy:     Head:     Right side of head: No tonsillar adenopathy.  Left side of head: No tonsillar adenopathy.     Cervical: No cervical adenopathy.  Skin:    Findings: No erythema or rash.     Nails: There is no clubbing.  Neurological:     Mental Status: She is alert.     Diagnostics:    Spirometry was performed and demonstrated an FEV1 of *** at *** % of predicted.  The patient had an Asthma Control Test with the following results: ACT Total Score: 25.    Results of blood tests obtained 12 August 2021 identifies IgG 1159 mg/DL  Assessment and Plan:   1. CVID (common variable immunodeficiency) ([place])   2. History of asthma   3. LPRD (laryngopharyngeal reflux disease)   4. Lymphocytic colitis     1.  Continue immunoglobulin infusions every week  2.  Continue to treat reflex / LPR:   A.  Pantoprazole  40 mg in AM  B.  Famotidine 40 mg in PM   3.  Treat and prevent inflammation of upper airway:   A.  Montelukast 10 mg -1 tablet 1 time per day  B.  OTC Nasacort -1 spray each nostril 3-7 times per week  4. If needed:  A. Loratadine 10 mg 1 tablet 1 time per day  B. Ipratropium 0.06% - 2 sprays each nostril every 6 hours (4x/day) C. Nasal saline D. Albuterol HFA - 2 inhalations every 4-6 hours  5. Blood - IgG  6. Continue budesonide therapy for lymphocytic colitis  7.  Return to clinic in 6 months or earlier if problem  Tiny appears to be doing very well on her current plan which includes subcutaneous immunoglobulin infusions to address her B-cell immune deficit and aggressive therapy to address her LPR and some anti-inflammatory agents for her airway and for her colitis.  We will recheck an IgG today.  I will see her back in this clinic in 6 months or earlier if there is a problem.  Allena Katz, MD Allergy / Immunology [place]

## 2022-02-25 ENCOUNTER — Other Ambulatory Visit (HOSPITAL_COMMUNITY): Payer: Self-pay

## 2022-02-25 ENCOUNTER — Encounter: Payer: Self-pay | Admitting: Allergy and Immunology

## 2022-02-25 ENCOUNTER — Other Ambulatory Visit: Payer: Self-pay

## 2022-02-25 LAB — IGG: IgG (Immunoglobin G), Serum: 899 mg/dL (ref 586–1602)

## 2022-02-27 NOTE — Patient Outreach (Signed)
  Care Coordination Late Entry  Follow Up Visit Note   Outreach completed 02/23/22 Name: Beverly [name] MRN: [id number] DOB: 05-28-1945  Beverly [name] is a 77 y.o. year old female who sees Isaac Bliss, Rayford Halsted, MD for primary care. I spoke with  Beverly [name] by phone today.  What matters to the patients health and wellness today?  Update on SNF Placement    Goals Addressed             This Visit's Progress    Strengthen Support/Request for SNF Placement   On track    Care Coordination Interventions: Solution-Focused Strategies employed:  Active listening / Reflection utilized  Emotional Support Provided Consideration of in-home help encouraged : options discussed Verbalization of feelings encouraged  LCSW continues to make attempts to contact Admission Coordinators with pt's preferred local skilled nursing facilities  LCSW spoke with Perrin Smack, Mudlogger of Admissions at [place]. LCSW faxed supportive paperwork to Fax [phone] [facility] Director of Admissions with my callback info for review. She agreed to contact me with decision LCSW submitted FL2, H&P, and HH PT Eval to [facility] and [place] for review LCSW is awaiting a return call from the following:            -[place] at [place] and [place] and [place] LCSW provided update to patient. Patient reports that she continues to remain hopeful despite concerns that insurance may not cover expenses, due to the amount of days she has been d/c from hospital. LCSW discussed inability to obtain prior auth from insurance, until a bed offer is provided Patient reports that she has the Patient Experience survey and will complete it. She endorses frustration with her experience after surgery Patient successfully identified  strengths, such as having a strong support system, which has greatly impacted her safety in the home        SDOH assessments and interventions completed:  No     Care Coordination Interventions:  Yes, provided   Follow up plan: Follow up call scheduled for 1-2 weeks    Encounter Outcome:  Pt. Visit Completed   Christa See, MSW, [email or web address] Phone [phone] 10:34 AM

## 2022-02-27 NOTE — Patient Instructions (Signed)
Visit Information  Thank you for taking time to visit with me today. Please don't hesitate to contact me if I can be of assistance to you.   Following are the goals we discussed today:   Goals Addressed             This Visit's Progress    Strengthen Support/Request for SNF Placement   On track    Care Coordination Interventions: Solution-Focused Strategies employed:  Active listening / Reflection utilized  Emotional Support Provided Consideration of in-home help encouraged : options discussed Verbalization of feelings encouraged  LCSW continues to make attempts to contact Admission Coordinators with pt's preferred local skilled nursing facilities  LCSW spoke with Perrin Smack, Mudlogger of Admissions at [place]. LCSW faxed supportive paperwork to Fax [phone] [facility] Director of Admissions with my callback info for review. She agreed to contact me with decision LCSW submitted FL2, H&P, and HH PT Eval to [facility] and [place] for review LCSW is awaiting a return call from the following:            -[place] at [place] and [place] and [place] LCSW provided update to patient. Patient reports that she continues to remain hopeful despite concerns that insurance may not cover expenses, due to the amount of days she has been d/c from hospital. LCSW discussed inability to obtain prior auth from insurance, until a bed offer is provided Patient reports that she has the Patient Experience survey and will complete it. She endorses frustration with her experience after surgery Patient successfully identified strengths, such as having a strong support system, which has greatly impacted her safety in the home        Please call the care guide team at [phone] if you need to cancel or reschedule your  appointment.   If you are experiencing a Mental Health or [place] or need someone to talk to, please call the Suicide and Crisis Lifeline: 988 call 911   Patient verbalizes understanding of instructions and care plan provided today and agrees to view in [place]. Active MyChart status and patient understanding of how to access instructions and care plan via MyChart confirmed with patient.     Christa See, MSW, [email or web address] Phone [phone] 10:35 AM

## 2022-03-05 ENCOUNTER — Other Ambulatory Visit: Payer: Self-pay

## 2022-03-11 ENCOUNTER — Other Ambulatory Visit (HOSPITAL_COMMUNITY): Payer: Self-pay

## 2022-03-11 ENCOUNTER — Other Ambulatory Visit: Payer: Self-pay

## 2022-03-11 MED ORDER — MONTELUKAST SODIUM 10 MG PO TABS
10.0000 mg | ORAL_TABLET | Freq: Every day | ORAL | 1 refills | Status: DC
  Filled 2022-03-11 – 2022-05-21 (×2): qty 90, 90d supply, fill #0
  Filled 2022-08-06: qty 90, 90d supply, fill #1

## 2022-03-11 NOTE — Addendum Note (Signed)
Addended by: Hedy Camara L on: 03/11/2022 11:24 AM   Modules accepted: Orders

## 2022-03-12 ENCOUNTER — Other Ambulatory Visit: Payer: Self-pay

## 2022-03-13 ENCOUNTER — Encounter: Payer: Self-pay | Admitting: Internal Medicine

## 2022-03-13 ENCOUNTER — Encounter: Payer: Self-pay | Admitting: Allergy and Immunology

## 2022-03-13 NOTE — Telephone Encounter (Signed)
Will you please advise since Dr. Neldon Mc is out of office today?

## 2022-03-16 ENCOUNTER — Other Ambulatory Visit: Payer: Self-pay

## 2022-03-16 ENCOUNTER — Other Ambulatory Visit (HOSPITAL_COMMUNITY): Payer: Self-pay

## 2022-03-16 DIAGNOSIS — M47816 Spondylosis without myelopathy or radiculopathy, lumbar region: Secondary | ICD-10-CM | POA: Diagnosis not present

## 2022-03-16 DIAGNOSIS — G894 Chronic pain syndrome: Secondary | ICD-10-CM | POA: Diagnosis not present

## 2022-03-16 DIAGNOSIS — Z79891 Long term (current) use of opiate analgesic: Secondary | ICD-10-CM | POA: Diagnosis not present

## 2022-03-16 DIAGNOSIS — M150 Primary generalized (osteo)arthritis: Secondary | ICD-10-CM | POA: Diagnosis not present

## 2022-03-16 MED ORDER — BELBUCA 600 MCG BU FILM
1.0000 | ORAL_FILM | Freq: Two times a day (BID) | BUCCAL | 2 refills | Status: DC
  Filled 2022-03-16: qty 60, 30d supply, fill #0
  Filled 2022-04-13: qty 60, 30d supply, fill #1
  Filled 2022-05-26: qty 60, 30d supply, fill #2

## 2022-03-17 ENCOUNTER — Other Ambulatory Visit: Payer: Self-pay

## 2022-03-17 DIAGNOSIS — Z4889 Encounter for other specified surgical aftercare: Secondary | ICD-10-CM | POA: Diagnosis not present

## 2022-03-17 DIAGNOSIS — M5416 Radiculopathy, lumbar region: Secondary | ICD-10-CM | POA: Diagnosis not present

## 2022-03-18 ENCOUNTER — Ambulatory Visit: Payer: Medicare PPO | Admitting: Internal Medicine

## 2022-03-18 ENCOUNTER — Encounter: Payer: Self-pay | Admitting: Internal Medicine

## 2022-03-18 ENCOUNTER — Other Ambulatory Visit (HOSPITAL_COMMUNITY): Payer: Self-pay

## 2022-03-18 VITALS — BP 102/68 | HR 67 | Temp 98.0°F | Wt 148.1 lb

## 2022-03-18 DIAGNOSIS — A6004 Herpesviral vulvovaginitis: Secondary | ICD-10-CM | POA: Diagnosis not present

## 2022-03-18 MED ORDER — VALACYCLOVIR HCL 1 G PO TABS
1000.0000 mg | ORAL_TABLET | Freq: Two times a day (BID) | ORAL | 0 refills | Status: DC
  Filled 2022-03-18 (×2): qty 14, 7d supply, fill #0

## 2022-03-18 NOTE — Progress Notes (Signed)
Established Patient Office Visit     CC/Reason for Visit: Pain in vulvar area  HPI: Beverly [name] is a 77 y.o. female who is coming in today for the above mentioned reasons.  She comes in today for evaluation of a blistery type rash in her vulvar area.  It has been extremely painful.  She has never been diagnosed with herpes.  Nonetheless over the past few months she has had several of these episodes.   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   right sholder   BCC (basal cell carcinoma of skin) 05/09/2014   mid lower back   BCC (basal cell carcinoma of skin) 05/03/2017   right low back   BCC (basal cell carcinoma of skin) 05/03/2017   left upper back   BCC (basal cell carcinoma of skin) 07/05/2018   left mid back   BCC (basal cell carcinoma of skin) 05/20/1992   upper back   BCC (basal cell carcinoma of skin) 07/29/1993   left sholder medial   BCC (basal cell carcinoma of skin) 07/29/1993   left sholder lateral   BCC (basal cell carcinoma of skin) 07/29/1993   right thigh   BCC (basal cell carcinoma of skin) 07/29/1993   right sholder   BCC (basal cell carcinoma of skin) 12/22/1994   right mid forearm   BCC (basal cell carcinoma of skin) 12/22/1994   right upper forearm   BCC (basal cell carcinoma of skin) 12/22/1994   lower right upper arm   BCC (basal cell carcinoma of skin) 12/22/1994   right upper arm sholder   BCC (basal cell carcinoma of skin) 08/11/1995   left leg below knee   BCC (basal cell carcinoma of skin) 04/11/2002   mid back   BCC (basal cell carcinoma of skin) 12/10/2002   right center upper back   BCC (basal cell carcinoma of skin) 05/26/2005   right post  sholder   BCC (basal cell carcinoma) 05/09/2014   left inner shin   BCC (basal cell carcinoma) 06/12/2014   left forearm   Bowen's disease 10/07/1994   right post knee, right inner forearm/wrist   Bowen's disease 08/11/1995   left sholder   Bronchiectasis ([place])    Cataract    left   Chronic pain    Common variable immunodeficiency [facility])    sees dr Gayleen [place] 02-11-2021 epic   Coronary artery disease    Depression    hx of   Diverticulosis of colon 1998   mild   DJD (degenerative joint disease)    Duodenal ulcer    h/o yrs ago   GERD (gastroesophageal reflux disease)    History of SCC (squamous cell carcinoma) of skin    Dr. Denna Haggard   History of sinus bradycardia    HLD (hyperlipidemia)    hypertriglyceridemia   Hypertensive retinopathy of both eyes 11/18/2017   Hypothyroidism    IBS (irritable bowel syndrome)    Internal hemorrhoids 1998   Lymphocytic colitis    Mitral regurgitation    mild   Nocardiosis    relased by infection disease dec 2022   Osteoarthritis  feet,shoulder,neck,back,hips and hands.   Pneumonia 2015   PONV (postoperative nausea and vomiting)    Rotator cuff tear, right 02/2019   infraspinatus and supraspinatus, and dislocation of long head of bicep tendon (Dr. Alvan Dame)   SCC (squamous cell carcinoma) 11/26/2014   left hand, right hand, right deltoid mnay areas   SCC (squamous cell carcinoma) 05/03/2017   left cheek   Sleep apnea    uses a mouth guard nightly   Squamous cell carcinoma in situ (SCCIS) 07/05/2018   left hand   Tracheobronchomalacia    Vitamin D deficiency    mild    Past Surgical History:  Procedure Laterality Date   ABDOMINAL EXPOSURE N/A 02/02/2022   Procedure: ABDOMINAL EXPOSURE;  Surgeon: Marty Heck, MD;  Location: [facility];  Service: Vascular;  Laterality: N/A;   [place]   complete   BIOPSY  11/05/2021   Procedure: BIOPSY;  Surgeon: Thornton Park, MD;  Location: [place];  Service:  Gastroenterology;;   BLEPHAROPLASTY Bilateral 01/2018   BROW LIFT Bilateral 10/10/2018   Procedure: Delphina Cahill LIFT;  Surgeon: Wallace Going, DO;  Location: [place];  Service: Plastics;  Laterality: Bilateral;   BUNIONECTOMY     R 12/08, L 2004 (Dr. Janus Molder)   [place]   bilateral   CARPAL TUNNEL RELEASE Left 03/06/2021   Procedure: Left Revision Carpal Tunnel Release with hypothenar fat pad flap;  Surgeon: [name], MD;  Location: [place];  Service: Orthopedics;  Laterality: Left;  with local anesthesia   CATARACT EXTRACTION Bilateral Left in 08/2012, Right 04/2016   Dr.Hecker   CESAREAN SECTION     1981 and [place]   laparoscopic   CLOSED REDUCTION NASAL FRACTURE N/A 08/29/2018   Procedure: CLOSED REDUCTION NASAL FRACTURE;  Surgeon: Wallace Going, DO;  Location: [place];  Service: Plastics;  Laterality: N/A;  1 hour, please   COLONOSCOPY  2006   COLONOSCOPY  01/2012   due again 01/2021; mild diverticulosis   CORONARY STENT INTERVENTION N/A 05/19/2021   Procedure: CORONARY STENT INTERVENTION;  Surgeon: Nelva Bush, MD;  Location: [place] CV LAB;  Service: Cardiovascular;  Laterality: N/A;   epidural steroid injection, back  02/2010   ESOPHAGOGASTRODUODENOSCOPY (EGD) WITH PROPOFOL N/A 11/05/2021   Procedure: ESOPHAGOGASTRODUODENOSCOPY (EGD) WITH PROPOFOL;  Surgeon: Thornton Park, MD;  Location: [place];  Service: Gastroenterology;  Laterality: N/A;   HIP SURGERY     right bursectomy x 3   HIP SURGERY Right 2000   torn cartilage, repaired   INGUINAL HERNIA REPAIR  10/2007   bilat   INTRAVASCULAR ULTRASOUND/IVUS N/A 05/19/2021   Procedure: Intravascular Ultrasound/IVUS;  Surgeon: Nelva Bush, MD;  Location: [place] CV LAB;  Service: Cardiovascular;  Laterality: N/A;   LEFT HEART CATH AND CORONARY ANGIOGRAPHY N/A 05/19/2021    Procedure: LEFT HEART CATH AND CORONARY ANGIOGRAPHY;  Surgeon: Nelva Bush, MD;  Location: [place] CV LAB;  Service: Cardiovascular;  Laterality: N/A;   NECK SURGERY  1989   c6-7 cervical laminectomy and diskecotmy   REVERSE SHOULDER ARTHROPLASTY Right 04/11/2019   Procedure: REVERSE SHOULDER ARTHROPLASTY;  Surgeon: Nicholes Stairs, MD;  Location: [place];  Service: Orthopedics;  Laterality: Right;  [place]   SHOULDER SURGERY Left 03/2003   left rotator cuff repair   SHOULDER SURGERY Left 03/14/2018   rotator cuff repair; Dr. Tonita Cong   TONSILLECTOMY  age 24   TROCHANTERIC BURSA EXCISION Right [place]   UPPER GASTROINTESTINAL ENDOSCOPY     WISDOM TOOTH EXTRACTION     as teenager    Social History:  reports that she has never smoked. She has never used smokeless tobacco. She reports current alcohol use of about 7.0 standard drinks of alcohol per week. She reports that she does not use drugs.  Allergies: Allergies  Allergen Reactions   Adhesive [Tape] Rash    Blister    Codeine Rash    Family History:  Family History  Problem Relation Age of Onset   Depression Mother    Schizophrenia Mother    Cancer Father        oral   Heart disease Father        bradycardia   Esophageal cancer Father    Cancer Sister        skin and lung   COPD Sister    Hypertension Sister    Osteoporosis Sister        compression fx's x 3 11/2018   Hyperthyroidism Brother    Cancer Brother        metastatic cancer to bone (?primary)   Diabetes Maternal Grandmother    Cancer Paternal Grandmother 78       colon cancer, metastatic to liver   Colon cancer Paternal Grandmother    Liver cancer Paternal Grandmother    Hyperlipidemia Daughter    Asthma Son    ADD / ADHD Other      Current Outpatient Medications:    albuterol (VENTOLIN HFA) 108 (90 Base) MCG/ACT inhaler, Inhale 2 puffs into the lungs every 4 (four) hours as needed for wheezing or shortness  of breath., Disp: 6.7 g, Rfl: 1   ARTIFICIAL TEAR OP, Place 2 drops into both eyes in the morning and at bedtime., Disp: , Rfl:    atorvastatin (LIPITOR) 40 MG tablet, Take 1 tablet (40 mg total) by mouth daily. (Patient taking differently: Take 40 mg by mouth at bedtime.), Disp: 90 tablet, Rfl: 0   azelastine (ASTELIN) 0.1 % nasal spray, Place 2 sprays into both nostrils 2 (two) times daily. Use in each nostril as directed, Disp: 30 mL, Rfl: 5   budesonide (ENTOCORT EC) 3 MG 24 hr capsule, Take 1 capsule (3 mg total) by mouth daily., Disp: 90 capsule, Rfl: 5   Buprenorphine HCl (BELBUCA) 600 MCG FILM, Place 1 Film inside cheek 2 (two) times daily. Stop [place]uca '750mg'$  dose., Disp: 60 each, Rfl: 2   Buprenorphine HCl (BELBUCA) 750 MCG FILM, Place 1 Film inside cheek 2 (two) times daily., Disp: 60 each, Rfl: 2   Calcium Carbonate Antacid (TUMS PO), Take 1 tablet by mouth as needed (heartburn)., Disp: , Rfl:    Calcium Carbonate-Vitamin D 600-200 MG-UNIT TABS, Take 1 tablet by mouth 2 (two) times daily., Disp: , Rfl:    clopidogrel (PLAVIX) 75 MG tablet, Take 1 tablet (75 mg total) by mouth daily., Disp: 90 tablet, Rfl: 3   colestipol (COLESTID) 1 g tablet, Take 1 tablet by mouth daily., Disp: 90 tablet, Rfl: 1   Cyanocobalamin (VITAMIN B-12 PO), Take 1 capsule by mouth daily., Disp: , Rfl:    Docusate Sodium 100 MG capsule, Take 100 mg by mouth in the morning and at bedtime., Disp: , Rfl:    EPINEPHrine 0.3 mg/0.3 mL IJ SOAJ injection, Inject 0.3 mg into the muscle as needed for anaphylaxis., Disp: , Rfl:  famotidine (PEPCID) 40 MG tablet, Take 1 tablet (40 mg total) by mouth at bedtime., Disp: 90 tablet, Rfl: 1   FIBER PO, Take 2 tablets by mouth in the morning and at bedtime., Disp: , Rfl:    gabapentin (NEURONTIN) 800 MG tablet, Take 1 tablet by mouth at bedtime. (Patient taking differently: Take 800 mg by mouth at bedtime.), Disp: 90 tablet, Rfl: 2   guaiFENesin (MUCINEX) 600 MG 12 hr tablet,  Take 600 mg by mouth 2 (two) times daily., Disp: , Rfl:    ipratropium (ATROVENT) 0.06 % nasal spray, Place 2 sprays into both nostrils 4 (four) times daily., Disp: 15 mL, Rfl: 5   Lactobacillus-Inulin (CULTURELLE ADULT ULT BALANCE) CAPS, Take 1 capsule by mouth daily., Disp: , Rfl:    levocetirizine (XYZAL) 5 MG tablet, Take 1 tablet (5 mg total) by mouth every evening. (Patient taking differently: Take 5 mg by mouth daily.), Disp: 90 tablet, Rfl: 5   levothyroxine (SYNTHROID) 25 MCG tablet, Take 1 tablet (25 mcg total) by mouth daily., Disp: 90 tablet, Rfl: 0   loratadine (CLARITIN) 10 MG tablet, Take 1 tablet (10 mg total) by mouth daily as needed for allergies (Can take an extra dose during flare ups.)., Disp: 180 tablet, Rfl: 1   melatonin 5 MG TABS, Take 5 mg by mouth at bedtime., Disp: , Rfl:    montelukast (SINGULAIR) 10 MG tablet, Take 1 tablet (10 mg total) by mouth daily., Disp: 90 tablet, Rfl: 1   nitroGLYCERIN (NITROSTAT) 0.4 MG SL tablet, Place 1 tablet (0.4 mg total) under the tongue every 5 (five) minutes as needed., Disp: 25 tablet, Rfl: 2   omega-3 acid ethyl esters (LOVAZA) 1 g capsule, Take 2 capsules (2 g total) by mouth 2 (two) times daily., Disp: 360 capsule, Rfl: 0   ondansetron (ZOFRAN) 4 MG tablet, Take 1 tablet (4 mg total) by mouth every 8 (eight) hours as needed for nausea or vomiting., Disp: 20 tablet, Rfl: 0   pantoprazole (PROTONIX) 40 MG tablet, Take 1 tablet (40 mg total) by mouth in the morning., Disp: 90 tablet, Rfl: 1   PARoxetine (PAXIL) 40 MG tablet, Take 40 mg by mouth at bedtime., Disp: , Rfl:    polyethylene glycol (MIRALAX / GLYCOLAX) 17 g packet, Take 17 g by mouth daily., Disp: , Rfl:    psyllium (METAMUCIL) 58.6 % packet, Take 1 packet by mouth daily., Disp: , Rfl:    triamcinolone (NASACORT) 55 MCG/ACT AERO nasal inhaler, Place 2 sprays into each nostril once daily., Disp: 16.9 g, Rfl: 5   valACYclovir (VALTREX) 1000 MG tablet, Take 1 tablet (1,000 mg  total) by mouth 2 (two) times daily for 7 days., Disp: 14 tablet, Rfl: 0   XEMBIFY 10 GM/50ML SOLN, Inject 10 g into the skin once a week. Wednesday, Disp: , Rfl:   Review of Systems:  Negative unless indicated in HPI.   Physical Exam: Vitals:   03/18/22 1457  BP: 102/68  Pulse: 67  Temp: 98 F (36.7 C)  TempSrc: Oral  SpO2: 96%  Weight: 148 lb 1.6 oz (67.2 kg)    Body mass index is 26.23 kg/m.   Physical Exam Vitals reviewed.  Constitutional:      Appearance: Normal appearance.  HENT:     Head: Normocephalic and atraumatic.  Eyes:     Conjunctiva/sclera: Conjunctivae normal.     Pupils: Pupils are equal, round, and reactive to light.  Genitourinary:    Labia:  Right: Lesion present.     Skin:    General: Skin is warm and dry.  Neurological:     General: No focal deficit present.     Mental Status: She is alert and oriented to person, place, and time.  Psychiatric:        Mood and Affect: Mood normal.        Behavior: Behavior normal.        Thought Content: Thought content normal.        Judgment: Judgment normal.      Impression and Plan:  Herpes simplex vulvovaginitis - Plan: valACYclovir (VALTREX) 1000 MG tablet  -She has classic genital herpes blister type lesions in her right labia minora. -Start valacyclovir 1000 mg twice a day for 7 days.  Time spent:32 minutes reviewing chart, interviewing and examining patient and formulating plan of care.     Lelon Frohlich, MD [facility]

## 2022-03-19 ENCOUNTER — Other Ambulatory Visit (HOSPITAL_COMMUNITY): Payer: Self-pay

## 2022-03-20 ENCOUNTER — Other Ambulatory Visit (HOSPITAL_COMMUNITY): Payer: Self-pay

## 2022-03-23 ENCOUNTER — Telehealth: Payer: Self-pay | Admitting: Licensed Clinical Social Worker

## 2022-03-23 DIAGNOSIS — Z1231 Encounter for screening mammogram for malignant neoplasm of breast: Secondary | ICD-10-CM | POA: Diagnosis not present

## 2022-03-23 LAB — HM MAMMOGRAPHY

## 2022-03-24 NOTE — Patient Outreach (Signed)
  Care Coordination   Follow Up Visit Note   03/23/22 Name: Beverly [name] MRN: [id number] DOB: Dec 12, 1945  Beverly [name] is a 77 y.o. year old female who sees Isaac Bliss, Rayford Halsted, MD for primary care. I spoke with  [name] by phone today.  What matters to the patients health and wellness today?  Care Coordination    Goals Addressed             This Visit's Progress    COMPLETED: Strengthen Support/Request for SNF Placement   On track    Care Coordination Interventions: Solution-Focused Strategies employed:  Active listening / Reflection utilized  Emotional Support Provided Consideration of in-home help encouraged : options discussed Verbalization of feelings encouraged  LCSW provided a safe space for patient to process overwhelming emotions regarding previous hospitalization and discharge. Pt requests LCSW to have Patient Experience contact her due to multiple gaps in care Patient endorses that she is "doing really well" Last visited Ortho 03/17/22. She is compliant with treatment plan and was cleared to drive Patient endorses some pain noting that she leaned off of the pain meds. Started PT last week and is scheduled to complete 2 sessions a week for 4 weeks  Pt identified self-care activities, including, spending time with friends No resource needs at this time. LCSW encouraged pt to contact her and/or PCP with any needs that may arise          SDOH assessments and interventions completed:  No     Care Coordination Interventions:  Yes, provided   Follow up plan: No further intervention required.   Encounter Outcome:  Pt. Visit Completed   Christa See, MSW, [email or web address] Phone [phone] 5:40 PM

## 2022-03-24 NOTE — Patient Instructions (Signed)
Visit Information  Thank you for taking time to visit with me today. Please don't hesitate to contact me if I can be of assistance to you.   Following are the goals we discussed today:   Goals Addressed             This Visit's Progress    COMPLETED: Strengthen Support/Request for SNF Placement   On track    Care Coordination Interventions: Solution-Focused Strategies employed:  Active listening / Reflection utilized  Emotional Support Provided Consideration of in-home help encouraged : options discussed Verbalization of feelings encouraged  LCSW provided a safe space for patient to process overwhelming emotions regarding previous hospitalization and discharge. Pt requests LCSW to have Patient Experience contact her due to multiple gaps in care Patient endorses that she is "doing really well" Last visited Ortho 03/17/22. She is compliant with treatment plan and was cleared to drive Patient endorses some pain noting that she leaned off of the pain meds. Started PT last week and is scheduled to complete 2 sessions a week for 4 weeks  Pt identified self-care activities, including, spending time with friends No resource needs at this time. LCSW encouraged pt to contact her and/or PCP with any needs that may arise          If you are experiencing a Mental Health or [place] or need someone to talk to, please call the Suicide and Crisis Lifeline: 988 call 911   Patient verbalizes understanding of instructions and care plan provided today and agrees to view in [place]. Active MyChart status and patient understanding of how to access instructions and care plan via MyChart confirmed with patient.     No further follow up required:    Christa See, MSW, [email or web address] Phone [phone] 5:41 PM

## 2022-03-25 DIAGNOSIS — M5416 Radiculopathy, lumbar region: Secondary | ICD-10-CM | POA: Diagnosis not present

## 2022-03-27 ENCOUNTER — Encounter: Payer: Self-pay | Admitting: Internal Medicine

## 2022-03-27 DIAGNOSIS — M5416 Radiculopathy, lumbar region: Secondary | ICD-10-CM | POA: Diagnosis not present

## 2022-03-27 DIAGNOSIS — Z13820 Encounter for screening for osteoporosis: Secondary | ICD-10-CM

## 2022-03-30 ENCOUNTER — Telehealth: Payer: Self-pay | Admitting: Internal Medicine

## 2022-03-30 NOTE — Telephone Encounter (Signed)
Pt has been sch for 12-10-2022

## 2022-03-30 NOTE — Telephone Encounter (Signed)
Pt called to sch bone density test and she is not due until after oct 22 and had surgery on L4,5,and 6 and would like to know if its ok to have bone density test

## 2022-04-01 DIAGNOSIS — M5416 Radiculopathy, lumbar region: Secondary | ICD-10-CM | POA: Diagnosis not present

## 2022-04-03 DIAGNOSIS — M5416 Radiculopathy, lumbar region: Secondary | ICD-10-CM | POA: Diagnosis not present

## 2022-04-08 ENCOUNTER — Other Ambulatory Visit: Payer: Self-pay | Admitting: Internal Medicine

## 2022-04-08 ENCOUNTER — Other Ambulatory Visit (HOSPITAL_COMMUNITY): Payer: Self-pay

## 2022-04-08 DIAGNOSIS — A6004 Herpesviral vulvovaginitis: Secondary | ICD-10-CM

## 2022-04-08 DIAGNOSIS — M5416 Radiculopathy, lumbar region: Secondary | ICD-10-CM | POA: Diagnosis not present

## 2022-04-08 MED ORDER — VALACYCLOVIR HCL 1 G PO TABS
1000.0000 mg | ORAL_TABLET | Freq: Two times a day (BID) | ORAL | 0 refills | Status: AC
  Filled 2022-04-08: qty 14, 7d supply, fill #0

## 2022-04-09 ENCOUNTER — Other Ambulatory Visit: Payer: Self-pay

## 2022-04-10 DIAGNOSIS — M5416 Radiculopathy, lumbar region: Secondary | ICD-10-CM | POA: Diagnosis not present

## 2022-04-13 ENCOUNTER — Other Ambulatory Visit: Payer: Self-pay

## 2022-04-13 ENCOUNTER — Other Ambulatory Visit (HOSPITAL_COMMUNITY): Payer: Self-pay

## 2022-04-14 ENCOUNTER — Encounter: Payer: Self-pay | Admitting: Gastroenterology

## 2022-04-15 DIAGNOSIS — M5416 Radiculopathy, lumbar region: Secondary | ICD-10-CM | POA: Diagnosis not present

## 2022-04-16 ENCOUNTER — Other Ambulatory Visit: Payer: Self-pay | Admitting: Internal Medicine

## 2022-04-16 DIAGNOSIS — M25551 Pain in right hip: Secondary | ICD-10-CM | POA: Diagnosis not present

## 2022-04-16 DIAGNOSIS — E782 Mixed hyperlipidemia: Secondary | ICD-10-CM

## 2022-04-16 MED ORDER — ATORVASTATIN CALCIUM 40 MG PO TABS
40.0000 mg | ORAL_TABLET | Freq: Every day | ORAL | 0 refills | Status: DC
  Filled 2022-04-16 – 2022-04-22 (×3): qty 90, 90d supply, fill #0

## 2022-04-16 MED ORDER — OMEGA-3-ACID ETHYL ESTERS 1 G PO CAPS
2.0000 g | ORAL_CAPSULE | Freq: Two times a day (BID) | ORAL | 0 refills | Status: DC
  Filled 2022-04-16: qty 360, 90d supply, fill #0

## 2022-04-17 ENCOUNTER — Other Ambulatory Visit: Payer: Self-pay

## 2022-04-17 ENCOUNTER — Other Ambulatory Visit (HOSPITAL_COMMUNITY): Payer: Self-pay

## 2022-04-17 ENCOUNTER — Other Ambulatory Visit (HOSPITAL_BASED_OUTPATIENT_CLINIC_OR_DEPARTMENT_OTHER): Payer: Self-pay

## 2022-04-17 DIAGNOSIS — M5416 Radiculopathy, lumbar region: Secondary | ICD-10-CM | POA: Diagnosis not present

## 2022-04-20 ENCOUNTER — Encounter: Payer: Self-pay | Admitting: Internal Medicine

## 2022-04-20 ENCOUNTER — Other Ambulatory Visit: Payer: Self-pay

## 2022-04-20 ENCOUNTER — Other Ambulatory Visit (HOSPITAL_COMMUNITY): Payer: Self-pay

## 2022-04-20 MED ORDER — PAROXETINE HCL 40 MG PO TABS
40.0000 mg | ORAL_TABLET | Freq: Every day | ORAL | 1 refills | Status: DC
  Filled 2022-04-20: qty 90, 90d supply, fill #0

## 2022-04-21 ENCOUNTER — Other Ambulatory Visit: Payer: Self-pay

## 2022-04-21 ENCOUNTER — Other Ambulatory Visit (HOSPITAL_COMMUNITY): Payer: Self-pay

## 2022-04-21 ENCOUNTER — Other Ambulatory Visit: Payer: Self-pay | Admitting: Internal Medicine

## 2022-04-21 DIAGNOSIS — M545 Low back pain, unspecified: Secondary | ICD-10-CM | POA: Diagnosis not present

## 2022-04-21 DIAGNOSIS — M5416 Radiculopathy, lumbar region: Secondary | ICD-10-CM | POA: Diagnosis not present

## 2022-04-21 DIAGNOSIS — E039 Hypothyroidism, unspecified: Secondary | ICD-10-CM

## 2022-04-21 MED ORDER — LEVOTHYROXINE SODIUM 25 MCG PO TABS
25.0000 ug | ORAL_TABLET | Freq: Every day | ORAL | 0 refills | Status: DC
  Filled ????-??-??: fill #0

## 2022-04-22 ENCOUNTER — Other Ambulatory Visit (HOSPITAL_COMMUNITY): Payer: Self-pay

## 2022-04-22 ENCOUNTER — Other Ambulatory Visit: Payer: Self-pay

## 2022-04-22 ENCOUNTER — Other Ambulatory Visit: Payer: Self-pay | Admitting: Internal Medicine

## 2022-04-22 ENCOUNTER — Encounter: Payer: Self-pay | Admitting: Internal Medicine

## 2022-04-22 DIAGNOSIS — A6004 Herpesviral vulvovaginitis: Secondary | ICD-10-CM

## 2022-04-22 DIAGNOSIS — E039 Hypothyroidism, unspecified: Secondary | ICD-10-CM

## 2022-04-22 MED ORDER — VALACYCLOVIR HCL 1 G PO TABS
1000.0000 mg | ORAL_TABLET | Freq: Two times a day (BID) | ORAL | 0 refills | Status: AC
  Filled 2022-04-22: qty 20, 10d supply, fill #0

## 2022-04-23 ENCOUNTER — Other Ambulatory Visit: Payer: Self-pay

## 2022-04-23 ENCOUNTER — Encounter: Payer: Self-pay | Admitting: Pharmacist

## 2022-04-23 MED ORDER — LEVOTHYROXINE SODIUM 25 MCG PO TABS
25.0000 ug | ORAL_TABLET | Freq: Every day | ORAL | 2 refills | Status: DC
  Filled 2022-04-23: qty 90, 90d supply, fill #0
  Filled 2022-08-06: qty 90, 90d supply, fill #1
  Filled 2022-11-02: qty 90, 90d supply, fill #2

## 2022-04-24 ENCOUNTER — Other Ambulatory Visit (HOSPITAL_COMMUNITY): Payer: Self-pay

## 2022-04-28 DIAGNOSIS — Z4889 Encounter for other specified surgical aftercare: Secondary | ICD-10-CM | POA: Diagnosis not present

## 2022-05-14 NOTE — Progress Notes (Signed)
Cardiology Office Note:    Date:  05/26/2022   ID:  Beverly [name], DOB August 06, 1945, MRN [id number]  PCP:  [name], Limmie Patricia, MD  [facility] Providers Cardiologist:  Charlton Haws, MD     Referring MD: [name], [name]   Chief Complaint:  Follow-up     History of Present Illness:   Beverly [name] is a 77 y.o. female with history of coronary calcification on CT 06/2019,SVT/nocturnal bradycardia, chest pain with normal NST 06/2019, HLD, OSA, ILD with severe tracheobronchomalacia followed by pulmonary. I saw the patient for preop evaluation for potential lumbar surgery on 05/28/21 Dr. Shon Baton with abdominal exposure for OLIF at L3-L5 with Dr. Chestine Spore.   I ordered Coronary CTA and this was suggestive of tight LAD and ramus. Surgery was canceled and she had cardiac cath 05/19/21 with DES LAD and DES ramus. Plan for Brilinta/ASA for at least 6 months. Counseled on taking celebrex but she can't stop b/c of pain. Low dose losartan added.   She was admitted with acute GI bleed 10/21/21 and brilinta held. She was switched to plavix..  She underwent back surgery 01/2022.   Patient comes in for f/u. Doing PT but overall doing well. Denies chest pain, palpitations, dyspnea.  No more bleeding on plavix-bruises easily. Walking 1 miles twice a week and working up to 3x/week. Was on a cruise with her daughter and did a lot of walking on excursions in [place].             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   right sholder   BCC (basal cell carcinoma of skin) 05/09/2014   mid lower back   BCC (basal cell carcinoma of skin) 05/03/2017   right low back   BCC (basal cell carcinoma of skin) 05/03/2017   left upper back   BCC  (basal cell carcinoma of skin) 07/05/2018   left mid back   BCC (basal cell carcinoma of skin) 05/20/1992   upper back   BCC (basal cell carcinoma of skin) 07/29/1993   left sholder medial   BCC (basal cell carcinoma of skin) 07/29/1993   left sholder lateral   BCC (basal cell carcinoma of skin) 07/29/1993   right thigh   BCC (basal cell carcinoma of skin) 07/29/1993   right sholder   BCC (basal cell carcinoma of skin) 12/22/1994   right mid forearm   BCC (basal cell carcinoma of skin) 12/22/1994   right upper forearm   BCC (basal cell carcinoma of skin) 12/22/1994   lower right upper arm   BCC (basal cell carcinoma of skin) 12/22/1994   right upper arm sholder   BCC (basal cell carcinoma of skin) 08/11/1995   left leg below knee   BCC (basal cell carcinoma of skin) 04/11/2002   mid back   BCC (basal cell carcinoma of skin) 12/10/2002   right center upper back   BCC (basal cell carcinoma of skin) 05/26/2005   right post sholder   BCC (basal cell carcinoma) 05/09/2014   left inner shin   BCC (basal cell carcinoma) 06/12/2014   left forearm   Bowen's disease 10/07/1994   right post knee, right  inner forearm/wrist   Bowen's disease 08/11/1995   left sholder   Bronchiectasis    Cataract    left   Chronic pain    Common variable immunodeficiency    sees dr Ellin Goodie 02-11-2021 epic   Coronary artery disease    Depression    hx of   Diverticulosis of colon 1998   mild   DJD (degenerative joint disease)    Duodenal ulcer    h/o yrs ago   GERD (gastroesophageal reflux disease)    History of SCC (squamous cell carcinoma) of skin    Dr. Jorja Loa   History of sinus bradycardia    HLD (hyperlipidemia)    hypertriglyceridemia   Hypertensive retinopathy of both eyes 11/18/2017   Hypothyroidism    IBS (irritable bowel syndrome)    Internal hemorrhoids 1998   Lymphocytic colitis    Mitral regurgitation    mild   Nocardiosis    relased by infection disease dec 2022    Osteoarthritis    feet,shoulder,neck,back,hips and hands.   Pneumonia 2015   PONV (postoperative nausea and vomiting)    Rotator cuff tear, right 02/2019   infraspinatus and supraspinatus, and dislocation of long head of bicep tendon (Dr. Charlann Boxer)   SCC (squamous cell carcinoma) 11/26/2014   left hand, right hand, right deltoid mnay areas   SCC (squamous cell carcinoma) 05/03/2017   left cheek   Sleep apnea    uses a mouth guard nightly   Squamous cell carcinoma in situ (SCCIS) 07/05/2018   left hand   Tracheobronchomalacia    Vitamin D deficiency    mild   Current Medications: Current Meds  Medication Sig   albuterol (VENTOLIN HFA) 108 (90 Base) MCG/ACT inhaler Inhale 2 puffs into the lungs every 4 (four) hours as needed for wheezing or shortness of breath.   ARTIFICIAL TEAR OP Place 2 drops into both eyes in the morning and at bedtime.   atorvastatin (LIPITOR) 40 MG tablet Take 1 tablet (40 mg total) by mouth daily.   azelastine (ASTELIN) 0.1 % nasal spray Place 2 sprays into both nostrils 2 (two) times daily. Use in each nostril as directed   budesonide (ENTOCORT EC) 3 MG 24 hr capsule Take 1 capsule (3 mg total) by mouth daily.   Buprenorphine HCl (BELBUCA) 600 MCG FILM Place 1 Film inside cheek 2 (two) times daily. Stop Belbuca 750mg  dose.   Calcium Carbonate Antacid (TUMS PO) Take 1 tablet by mouth as needed (heartburn).   Calcium Carbonate-Vitamin D 600-200 MG-UNIT TABS Take 1 tablet by mouth 2 (two) times daily.   clopidogrel (PLAVIX) 75 MG tablet Take 1 tablet (75 mg total) by mouth daily.   colestipol (COLESTID) 1 g tablet Take 1 tablet by mouth daily.   Cyanocobalamin (VITAMIN B-12 PO) Take 1 capsule by mouth daily.   Docusate Sodium 100 MG capsule Take 100 mg by mouth in the morning and at bedtime.   EPINEPHrine 0.3 mg/0.3 mL IJ SOAJ injection Inject 0.3 mg into the muscle as needed for anaphylaxis.   famotidine (PEPCID) 40 MG tablet Take 1 tablet (40 mg total) by mouth at  bedtime.   FIBER PO Take 2 tablets by mouth in the morning and at bedtime.   gabapentin (NEURONTIN) 800 MG tablet Take 1 tablet by mouth at bedtime. (Patient taking differently: Take 800 mg by mouth at bedtime.)   guaiFENesin (MUCINEX) 600 MG 12 hr tablet Take 600 mg by mouth 2 (two) times daily.   ipratropium (ATROVENT)  0.06 % nasal spray Place 2 sprays into both nostrils 4 (four) times daily.   Lactobacillus-Inulin (CULTURELLE ADULT ULT BALANCE) CAPS Take 1 capsule by mouth daily.   levocetirizine (XYZAL) 5 MG tablet Take 1 tablet (5 mg total) by mouth every evening. (Patient taking differently: Take 5 mg by mouth daily.)   levothyroxine (SYNTHROID) 25 MCG tablet Take 1 tablet (25 mcg total) by mouth daily.   loratadine (CLARITIN) 10 MG tablet Take 1 tablet (10 mg total) by mouth daily as needed for allergies (Can take an extra dose during flare ups.).   melatonin 5 MG TABS Take 5 mg by mouth at bedtime.   montelukast (SINGULAIR) 10 MG tablet Take 1 tablet (10 mg total) by mouth daily.   nitroGLYCERIN (NITROSTAT) 0.4 MG SL tablet Place 1 tablet (0.4 mg total) under the tongue every 5 (five) minutes as needed.   omega-3 acid ethyl esters (LOVAZA) 1 g capsule Take 2 capsules (2 g total) by mouth 2 (two) times daily.   pantoprazole (PROTONIX) 40 MG tablet Take 1 tablet (40 mg total) by mouth in the morning.   PARoxetine (PAXIL) 40 MG tablet Take 1 tablet (40 mg total) by mouth at bedtime.   polyethylene glycol (MIRALAX / GLYCOLAX) 17 g packet Take 17 g by mouth daily.   psyllium (METAMUCIL) 58.6 % packet Take 1 packet by mouth daily.   triamcinolone (NASACORT) 55 MCG/ACT AERO nasal inhaler Place 2 sprays into each nostril once daily.   XEMBIFY 10 GM/50ML SOLN Inject 10 g into the skin once a week. Wednesday    Allergies:   Adhesive [tape] and Codeine   Social History   Tobacco Use   Smoking status: Never   Smokeless tobacco: Never  Vaping Use   Vaping Use: Never used  Substance Use Topics    Alcohol use: Yes    Alcohol/week: 7.0 standard drinks of alcohol    Types: 7 Glasses of wine per week    Comment: 1 glass wine per day   Drug use: No    Family Hx: The patient's family history includes ADD / ADHD in an other family member; Asthma in her son; COPD in her sister; Cancer in her brother, father, and sister; Cancer (age of onset: 62) in her paternal grandmother; Colon cancer in her paternal grandmother; Depression in her mother; Diabetes in her maternal grandmother; Esophageal cancer in her father; Heart disease in her father; Hyperlipidemia in her daughter; Hypertension in her sister; Hyperthyroidism in her brother; Liver cancer in her paternal grandmother; Osteoporosis in her sister; Schizophrenia in her mother.  ROS     Physical Exam:    VS:  BP 124/78   Pulse (!) 58   Ht 5\' 3"  (1.6 m)   Wt 152 lb 9.6 oz (69.2 kg)   SpO2 96%   BMI 27.03 kg/m     Wt Readings from Last 3 Encounters:  05/26/22 152 lb 9.6 oz (69.2 kg)  03/18/22 148 lb 1.6 oz (67.2 kg)  02/24/22 152 lb 14.4 oz (69.4 kg)    Physical Exam  GEN: Well nourished, well developed, in no acute distress  Neck: no JVD, carotid bruits, or masses Cardiac:RRR; no murmurs, rubs, or gallops  Respiratory:  clear to auscultation bilaterally, normal work of breathing GI: soft, nontender, nondistended, + BS Ext: without cyanosis, clubbing, or edema, Good distal pulses bilaterally Neuro:  Alert and Oriented x 3,  Psych: euthymic mood, full affect        EKGs/Labs/Other Test Reviewed:  EKG:  EKG is not ordered today.     Recent Labs: 08/12/2021: TSH 1.33 11/06/2021: Magnesium 2.2 01/07/2022: Hemoglobin 12.1; Platelets CANCELED 01/19/2022: BUN 13; Creatinine, Ser 0.71; Potassium 4.5; Sodium 139   Recent Lipid Panel Recent Labs    01/07/22 1538  CHOL 147  TRIG 208*  HDL 45*  LDLCALC 72     Prior CV Studies:     Cath: 05/19/21   Conclusions: Severe two-vessel coronary artery disease with up to 90%  stenoses involving the ostial through mid LAD and ostial through proximal ramus intermedius.  Mild, nonobstructive CAD noted in the LCx and RCA. Normal left ventricular systolic function (LVEF 55-65%) with normal filling pressure (LVEDP 10 mmHg) Successful IVUS-guided PCI to ostial through mid LAD using Onyx Frontier 2.75 x 38 mm drug-eluting stent with 0% residual stenosis and TIMI-3 flow Successful PCI to ostial through proximal ramus intermedius using Onyx Frontier 2.0 x 22 mm drug-eluting stent with 0% residual stenosis and TIMI-3 flow.   Recommendations: Overnight extended recovery. Dual antiplatelet therapy with aspirin and clopidogrel, ideally for 6 months.  Nonemergent surgery will likely need to be delayed to allow for adequate antiplatelet therapy following two-vessel PCI. Aggressive secondary prevention.   Yvonne Kendall, MD [facility]   Diagnostic Dominance: Right Intervention         Echo from 06/23/19:    1. Left ventricular ejection fraction, by estimation, is 55 to 60%. The  left ventricle has normal function. The left ventricle has no regional  wall motion abnormalities. There is mild left ventricular hypertrophy.  Left ventricular diastolic function  could not be evaluated.   2. Right ventricular systolic function is normal. The right ventricular  size is normal.   3. The mitral valve is grossly normal. Trivial mitral valve  regurgitation. No evidence of mitral stenosis.   4. The aortic valve has an indeterminant number of cusps. Aortic valve  regurgitation is not visualized. Mild aortic valve sclerosis is present,  with no evidence of aortic valve stenosis.     Risk Assessment/Calculations/Metrics:              ASSESSMENT & PLAN:   No problem-specific Assessment & Plan notes found for this encounter.   CAD cardiac cath 05/19/21 with DES LAD and DES ramus. Plan for Plavix alone after GI bleed. Doing  well without chest pain. Continue to increase exercise  as back tolerates. Will discuss if she needs to be on plavix long term.    GI bleed now on Plavix, no ASA -no further bleeding problems   ILD-with chronic DOE stable   SVT/nocturnal bradycardia-no recent symptoms   HLD LDL 72 on lipitor- trig up but non-fasting lab 12/2021    Tracheobronchomalacia   OSA with mouth guard.            Dispo:  No follow-ups on file.   Medication Adjustments/Labs and Tests Ordered: Current medicines are reviewed at length with the patient today.  Concerns regarding medicines are outlined above.  Tests Ordered: No orders of the defined types were placed in this encounter.  Medication Changes: No orders of the defined types were placed in this encounter.  Signed, Jacolyn Reedy, PA-C  05/26/2022 2:56 PM    [facility] [address] [place], [place], [place]  [address] Phone: [phone]; Fax: [phone]

## 2022-05-15 DIAGNOSIS — M545 Low back pain, unspecified: Secondary | ICD-10-CM | POA: Diagnosis not present

## 2022-05-15 DIAGNOSIS — M5416 Radiculopathy, lumbar region: Secondary | ICD-10-CM | POA: Diagnosis not present

## 2022-05-21 ENCOUNTER — Other Ambulatory Visit: Payer: Self-pay

## 2022-05-22 DIAGNOSIS — M545 Low back pain, unspecified: Secondary | ICD-10-CM | POA: Diagnosis not present

## 2022-05-22 DIAGNOSIS — M5416 Radiculopathy, lumbar region: Secondary | ICD-10-CM | POA: Diagnosis not present

## 2022-05-26 ENCOUNTER — Ambulatory Visit: Payer: Medicare PPO | Attending: Cardiovascular Disease | Admitting: Physician Assistant

## 2022-05-26 ENCOUNTER — Other Ambulatory Visit: Payer: Self-pay

## 2022-05-26 ENCOUNTER — Encounter: Payer: Self-pay | Admitting: Physician Assistant

## 2022-05-26 VITALS — BP 124/78 | HR 58 | Ht 63.0 in | Wt 152.6 lb

## 2022-05-26 DIAGNOSIS — J398 Other specified diseases of upper respiratory tract: Secondary | ICD-10-CM

## 2022-05-26 DIAGNOSIS — Z8719 Personal history of other diseases of the digestive system: Secondary | ICD-10-CM | POA: Diagnosis not present

## 2022-05-26 DIAGNOSIS — I471 Supraventricular tachycardia, unspecified: Secondary | ICD-10-CM | POA: Diagnosis not present

## 2022-05-26 DIAGNOSIS — J849 Interstitial pulmonary disease, unspecified: Secondary | ICD-10-CM

## 2022-05-26 DIAGNOSIS — I2510 Atherosclerotic heart disease of native coronary artery without angina pectoris: Secondary | ICD-10-CM

## 2022-05-26 DIAGNOSIS — G4733 Obstructive sleep apnea (adult) (pediatric): Secondary | ICD-10-CM

## 2022-05-26 DIAGNOSIS — E785 Hyperlipidemia, unspecified: Secondary | ICD-10-CM | POA: Diagnosis not present

## 2022-05-26 NOTE — Patient Instructions (Signed)
Medication Instructions:  Your physician recommends that you continue on your current medications as directed. Please refer to the Current Medication list given to you today.  *If you need a refill on your cardiac medications before your next appointment, please call your pharmacy*   Follow-Up: At [facility], you and your health needs are our priority.  As part of our continuing mission to provide you with exceptional heart care, we have created designated Provider Care Teams.  These Care Teams include your primary Cardiologist (physician) and Advanced Practice Providers (APPs -  Physician Assistants and Nurse Practitioners) who all work together to provide you with the care you need, when you need it.  We recommend signing up for the patient portal called "MyChart".  Sign up information is provided on this After Visit Summary.  MyChart is used to connect with patients for Virtual Visits (Telemedicine).  Patients are able to view lab/test results, encounter notes, upcoming appointments, etc.  Non-urgent messages can be sent to your provider as well.   To learn more about what you can do with MyChart, go to [email or web address].    Your next appointment:   6 month(s)  Provider:   Charlton Haws, MD

## 2022-05-27 ENCOUNTER — Other Ambulatory Visit (HOSPITAL_COMMUNITY): Payer: Self-pay

## 2022-05-27 DIAGNOSIS — Z79891 Long term (current) use of opiate analgesic: Secondary | ICD-10-CM | POA: Diagnosis not present

## 2022-05-27 DIAGNOSIS — M47816 Spondylosis without myelopathy or radiculopathy, lumbar region: Secondary | ICD-10-CM | POA: Diagnosis not present

## 2022-05-27 DIAGNOSIS — M150 Primary generalized (osteo)arthritis: Secondary | ICD-10-CM | POA: Diagnosis not present

## 2022-05-27 DIAGNOSIS — G894 Chronic pain syndrome: Secondary | ICD-10-CM | POA: Diagnosis not present

## 2022-05-27 MED ORDER — PAROXETINE HCL 40 MG PO TABS
40.0000 mg | ORAL_TABLET | Freq: Every day | ORAL | 1 refills | Status: DC
  Filled 2022-05-27 – 2022-06-28 (×2): qty 90, 90d supply, fill #0

## 2022-05-27 MED ORDER — METHOCARBAMOL 500 MG PO TABS
500.0000 mg | ORAL_TABLET | Freq: Two times a day (BID) | ORAL | 2 refills | Status: DC | PRN
  Filled 2022-05-27 – 2022-06-04 (×2): qty 120, 60d supply, fill #0
  Filled 2022-08-14: qty 120, 60d supply, fill #1
  Filled 2023-01-07: qty 120, 60d supply, fill #2

## 2022-05-27 MED ORDER — BELBUCA 750 MCG BU FILM
750.0000 ug | ORAL_FILM | Freq: Two times a day (BID) | BUCCAL | 2 refills | Status: DC
  Filled 2022-05-27 – 2022-06-04 (×2): qty 60, 30d supply, fill #0
  Filled 2022-07-09: qty 60, 30d supply, fill #1

## 2022-05-29 DIAGNOSIS — M545 Low back pain, unspecified: Secondary | ICD-10-CM | POA: Diagnosis not present

## 2022-05-29 DIAGNOSIS — M5416 Radiculopathy, lumbar region: Secondary | ICD-10-CM | POA: Diagnosis not present

## 2022-06-01 DIAGNOSIS — D485 Neoplasm of uncertain behavior of skin: Secondary | ICD-10-CM | POA: Diagnosis not present

## 2022-06-01 DIAGNOSIS — C44719 Basal cell carcinoma of skin of left lower limb, including hip: Secondary | ICD-10-CM | POA: Diagnosis not present

## 2022-06-01 DIAGNOSIS — L570 Actinic keratosis: Secondary | ICD-10-CM | POA: Diagnosis not present

## 2022-06-01 DIAGNOSIS — C44329 Squamous cell carcinoma of skin of other parts of face: Secondary | ICD-10-CM | POA: Diagnosis not present

## 2022-06-01 DIAGNOSIS — C44729 Squamous cell carcinoma of skin of left lower limb, including hip: Secondary | ICD-10-CM | POA: Diagnosis not present

## 2022-06-04 ENCOUNTER — Other Ambulatory Visit: Payer: Self-pay | Admitting: Internal Medicine

## 2022-06-04 ENCOUNTER — Other Ambulatory Visit (HOSPITAL_COMMUNITY): Payer: Self-pay

## 2022-06-04 ENCOUNTER — Other Ambulatory Visit: Payer: Self-pay | Admitting: Allergy and Immunology

## 2022-06-04 ENCOUNTER — Other Ambulatory Visit: Payer: Self-pay

## 2022-06-04 MED ORDER — OMEGA-3-ACID ETHYL ESTERS 1 G PO CAPS
2.0000 g | ORAL_CAPSULE | Freq: Two times a day (BID) | ORAL | 1 refills | Status: DC
  Filled 2022-06-04 – 2022-07-09 (×2): qty 360, 90d supply, fill #0
  Filled 2022-10-12: qty 360, 90d supply, fill #1

## 2022-06-04 MED ORDER — COLESTIPOL HCL 1 G PO TABS
1.0000 g | ORAL_TABLET | Freq: Every day | ORAL | 1 refills | Status: DC
  Filled 2022-06-04: qty 90, 90d supply, fill #0
  Filled 2022-06-28 – 2022-09-06 (×2): qty 90, 90d supply, fill #1

## 2022-06-05 ENCOUNTER — Other Ambulatory Visit: Payer: Self-pay

## 2022-06-05 DIAGNOSIS — M545 Low back pain, unspecified: Secondary | ICD-10-CM | POA: Diagnosis not present

## 2022-06-05 DIAGNOSIS — M5416 Radiculopathy, lumbar region: Secondary | ICD-10-CM | POA: Diagnosis not present

## 2022-06-08 ENCOUNTER — Other Ambulatory Visit: Payer: Self-pay

## 2022-06-10 ENCOUNTER — Other Ambulatory Visit: Payer: Self-pay

## 2022-06-10 ENCOUNTER — Other Ambulatory Visit (HOSPITAL_COMMUNITY): Payer: Self-pay

## 2022-06-10 DIAGNOSIS — C44719 Basal cell carcinoma of skin of left lower limb, including hip: Secondary | ICD-10-CM | POA: Diagnosis not present

## 2022-06-10 DIAGNOSIS — D0472 Carcinoma in situ of skin of left lower limb, including hip: Secondary | ICD-10-CM | POA: Diagnosis not present

## 2022-06-10 MED ORDER — FLUOROURACIL 5 % EX CREA
TOPICAL_CREAM | CUTANEOUS | 0 refills | Status: DC
  Filled 2022-06-10: qty 40, 21d supply, fill #0

## 2022-06-12 ENCOUNTER — Other Ambulatory Visit: Payer: Self-pay

## 2022-06-12 DIAGNOSIS — M545 Low back pain, unspecified: Secondary | ICD-10-CM | POA: Diagnosis not present

## 2022-06-12 DIAGNOSIS — M5416 Radiculopathy, lumbar region: Secondary | ICD-10-CM | POA: Diagnosis not present

## 2022-06-15 DIAGNOSIS — Z4889 Encounter for other specified surgical aftercare: Secondary | ICD-10-CM | POA: Diagnosis not present

## 2022-06-16 ENCOUNTER — Ambulatory Visit: Payer: Medicare PPO | Admitting: Cardiovascular Disease

## 2022-06-24 ENCOUNTER — Encounter: Payer: Self-pay | Admitting: Internal Medicine

## 2022-06-24 ENCOUNTER — Ambulatory Visit: Payer: Medicare PPO | Admitting: Internal Medicine

## 2022-06-24 VITALS — BP 126/74 | HR 70 | Temp 98.5°F | Ht 64.0 in | Wt 153.0 lb

## 2022-06-24 DIAGNOSIS — J301 Allergic rhinitis due to pollen: Secondary | ICD-10-CM

## 2022-06-24 DIAGNOSIS — J479 Bronchiectasis, uncomplicated: Secondary | ICD-10-CM | POA: Diagnosis not present

## 2022-06-24 DIAGNOSIS — G4733 Obstructive sleep apnea (adult) (pediatric): Secondary | ICD-10-CM | POA: Diagnosis not present

## 2022-06-24 DIAGNOSIS — K219 Gastro-esophageal reflux disease without esophagitis: Secondary | ICD-10-CM | POA: Diagnosis not present

## 2022-06-24 NOTE — Progress Notes (Signed)
Beverly [name]    [id number]    06-13-1945  Primary Care Physician:Hernandez Priscella Mann, MD Date of Appointment: 06/24/2022 Established Patient Visit  Chief complaint:   Chief Complaint  Patient presents with   Follow-up    Productive cough 2 - 3 times a day      HPI: Beverly [name] is a 77 y.o. woman with bronchiectasis a hypogammaglobulinemia with chronic rhinitis. On IVIG infusions for hypogammaglobulinemia. Has nocardia pulmonary infection and is on 12 months therapy with bactrim which finished Dec 2022. Also has LPR and history of gastric ulcers. Has chronic vasomotor rhinitis and takes ipratropium. Has CAD and had PCI with 1 DES stents placed in 2023. Has lymphocytic colitis.   Interval Updates: Here for follow up.  She has since had back surgery. Doing PT to help with right leg pain.  She has seen Dr. Lucie Leather and continues on IVIG infusions for CVID.  She is on PPI for LPR.  She is on some prn nasal sprays (astelin, nasacort) astelin works best for her. The steroid inhalers irritate her nose.  On xyzal for rhinitis/allergies - this works much butter for her and her spring has been much better.   No interval flares of her bronchiectasis. Took a trip with her daughter last month - [place] cruise.   She is currently off all inhaler/nebulizer therapy.   I have reviewed the patient's past medical, family, and social history and made changes as appropriate.    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   right sholder   BCC (basal cell carcinoma of skin) 05/09/2014   mid lower back   BCC (basal cell carcinoma of skin) 05/03/2017   right low back   BCC (basal cell carcinoma of skin) 05/03/2017   left  upper back   BCC (basal cell carcinoma of skin) 07/05/2018   left mid back   BCC (basal cell carcinoma of skin) 05/20/1992   upper back   BCC (basal cell carcinoma of skin) 07/29/1993   left sholder medial   BCC (basal cell carcinoma of skin) 07/29/1993   left sholder lateral   BCC (basal cell carcinoma of skin) 07/29/1993   right thigh   BCC (basal cell carcinoma of skin) 07/29/1993   right sholder   BCC (basal cell carcinoma of skin) 12/22/1994   right mid forearm   BCC (basal cell carcinoma of skin) 12/22/1994   right upper forearm   BCC (basal cell carcinoma of skin) 12/22/1994   lower right upper arm   BCC (basal cell carcinoma of skin) 12/22/1994   right upper arm sholder   BCC (basal cell carcinoma of skin) 08/11/1995   left leg below knee   BCC (basal cell carcinoma of skin) 04/11/2002   mid back   BCC (basal cell carcinoma of skin) 12/10/2002   right center upper back   BCC (basal cell carcinoma of skin) 05/26/2005   right post sholder   BCC (basal cell carcinoma) 05/09/2014   left inner shin   BCC (basal cell carcinoma) 06/12/2014   left forearm   Bowen's disease 10/07/1994   right post  knee, right inner forearm/wrist   Bowen's disease 08/11/1995   left sholder   Bronchiectasis (HCC)    Cataract    left   Chronic pain    Common variable immunodeficiency [facility])    sees dr Ellin Goodie 02-11-2021 epic   Coronary artery disease    Depression    hx of   Diverticulosis of colon 1998   mild   DJD (degenerative joint disease)    Duodenal ulcer    h/o yrs ago   GERD (gastroesophageal reflux disease)    History of SCC (squamous cell carcinoma) of skin    Dr. Jorja Loa   History of sinus bradycardia    HLD (hyperlipidemia)    hypertriglyceridemia   Hypertensive retinopathy of both eyes 11/18/2017   Hypothyroidism    IBS (irritable bowel syndrome)    Internal hemorrhoids 1998   Lymphocytic colitis    Mitral regurgitation    mild   Nocardiosis    relased by  infection disease dec 2022   Osteoarthritis    feet,shoulder,neck,back,hips and hands.   Pneumonia 2015   PONV (postoperative nausea and vomiting)    Rotator cuff tear, right 02/2019   infraspinatus and supraspinatus, and dislocation of long head of bicep tendon (Dr. Charlann Boxer)   SCC (squamous cell carcinoma) 11/26/2014   left hand, right hand, right deltoid mnay areas   SCC (squamous cell carcinoma) 05/03/2017   left cheek   Sleep apnea    uses a mouth guard nightly   Squamous cell carcinoma in situ (SCCIS) 07/05/2018   left hand   Tracheobronchomalacia    Vitamin D deficiency    mild    Past Surgical History:  Procedure Laterality Date   ABDOMINAL EXPOSURE N/A 02/02/2022   Procedure: ABDOMINAL EXPOSURE;  Surgeon: Cephus Shelling, MD;  Location[facility];  Service: Vascular;  Laterality: N/A;   ABDOMINAL HYSTERECTOMY  1998   complete   BIOPSY  11/05/2021   Procedure: BIOPSY;  Surgeon: Tressia Danas, MD;  Location: [facility] ENDOSCOPY;  Service: Gastroenterology;;   BLEPHAROPLASTY Bilateral 01/2018   BROW LIFT Bilateral 10/10/2018   Procedure: Coralee Rud LIFT;  Surgeon: Peggye Form, DO;  Location: [facility];  Service: Plastics;  Laterality: Bilateral;   BUNIONECTOMY     R 12/08, L 2004 (Dr. Wynelle Cleveland)   CARDIAC CATHETERIZATION     CARPAL TUNNEL RELEASE  1989   bilateral   CARPAL TUNNEL RELEASE Left 03/06/2021   Procedure: Left Revision Carpal Tunnel Release with hypothenar fat pad flap;  Surgeon: Gomez Cleverly, MD;  Location: [facility] [place];  Service: Orthopedics;  Laterality: Left;  with local anesthesia   CATARACT EXTRACTION Bilateral Left in 08/2012, Right 04/2016   Dr.Hecker   CESAREAN SECTION     1981 and 1986   CHOLECYSTECTOMY  1992   laparoscopic   CLOSED REDUCTION NASAL FRACTURE N/A 08/29/2018   Procedure: CLOSED REDUCTION NASAL FRACTURE;  Surgeon: Peggye Form, DO;  Location: [facility];  Service: Plastics;   Laterality: N/A;  1 hour, please   COLONOSCOPY  2006   COLONOSCOPY  01/2012   due again 01/2021; mild diverticulosis   CORONARY STENT INTERVENTION N/A 05/19/2021   Procedure: CORONARY STENT INTERVENTION;  Surgeon: Yvonne Kendall, MD;  Location: [facility] INVASIVE [facility];  Service: Cardiovascular;  Laterality: N/A;   CORONARY ULTRASOUND/IVUS N/A 05/19/2021   Procedure: Intravascular Ultrasound/IVUS;  Surgeon: Yvonne Kendall, MD;  Location: [facility];  Service: Cardiovascular;  Laterality: N/A;   epidural  steroid injection, back  02/2010   ESOPHAGOGASTRODUODENOSCOPY (EGD) WITH PROPOFOL N/A 11/05/2021   Procedure: ESOPHAGOGASTRODUODENOSCOPY (EGD) WITH PROPOFOL;  Surgeon: Tressia Danas, MD;  Location: [facility] ENDOSCOPY;  Service: Gastroenterology;  Laterality: N/A;   HIP SURGERY     right bursectomy x 3   HIP SURGERY Right 2000   torn cartilage, repaired   INGUINAL HERNIA REPAIR  10/2007   bilat   LEFT HEART CATH AND CORONARY ANGIOGRAPHY N/A 05/19/2021   Procedure: LEFT HEART CATH AND CORONARY ANGIOGRAPHY;  Surgeon: Yvonne Kendall, MD;  Location: [facility];  Service: Cardiovascular;  Laterality: N/A;   NECK SURGERY  1989   c6-7 cervical laminectomy and diskecotmy   REVERSE SHOULDER ARTHROPLASTY Right 04/11/2019   Procedure: REVERSE SHOULDER ARTHROPLASTY;  Surgeon: Yolonda Kida, MD;  Location: [facility] OR;  Service: Orthopedics;  Laterality: Right;  Regional Block   SHOULDER SURGERY Left 03/2003   left rotator cuff repair   SHOULDER SURGERY Left 03/14/2018   rotator cuff repair; Dr. Shelle Iron   TONSILLECTOMY  age 9   TROCHANTERIC BURSA EXCISION Right 1978   TUBAL LIGATION  1986   UPPER GASTROINTESTINAL ENDOSCOPY     WISDOM TOOTH EXTRACTION     as teenager    Family History  Problem Relation Age of Onset   Depression Mother    Schizophrenia Mother    Cancer Father        oral   Heart disease Father        bradycardia   Esophageal cancer Father    Cancer Sister         skin and lung   COPD Sister    Hypertension Sister    Osteoporosis Sister        compression fx's x 3 11/2018   Hyperthyroidism Brother    Cancer Brother        metastatic cancer to bone (?primary)   Diabetes Maternal Grandmother    Cancer Paternal Grandmother 67       colon cancer, metastatic to liver   Colon cancer Paternal Grandmother    Liver cancer Paternal Grandmother    Hyperlipidemia Daughter    Asthma Son    ADD / ADHD Other     Social History   Occupational History   Occupation: STAFF Academic librarian: [employer]   Occupation: Retired  Tobacco Use   Smoking status: Never   Smokeless tobacco: Never  Vaping Use   Vaping Use: Never used  Substance and Sexual Activity   Alcohol use: Yes    Alcohol/week: 7.0 standard drinks of alcohol    Types: 7 Glasses of wine per week    Comment: 1 glass wine per day   Drug use: No   Sexual activity: Not Currently     Physical Exam: Blood pressure 126/74, pulse 70, temperature 98.5 F (36.9 C), temperature source Oral, height 5\' 4"  (1.626 m), weight 153 lb (69.4 kg), SpO2 96 %.  Gen:      NAD Lungs:  ctab no wheezes or crackles CV:      RRR MSK: numerous basal cell carcinoma loci noted on face, arms, legs  Data Reviewed: Imaging: I have personally reviewed the CT Chest May 2021 which shows central and lower lobe bronchiectasis. TBM on expiratory cuts.   PFTs: May 2022 Spirometry WNL.   Sleep study June 2021 IMPRESSIONS - No significant obstructive sleep apnea occurred during this study (AHI = 1.9/h). - No significant central sleep apnea occurred during  this study (CAI = 0.7/h). - The patient had minimal or no oxygen desaturation during the study (Min O2 = 89.0%) - The patient snored with soft snoring volume.  Labs: Lab Results  Component Value Date   WBC 5.7 01/07/2022   HGB 12.1 01/07/2022   HCT 36.4 01/07/2022   MCV 94.8 01/07/2022   PLT CANCELED 01/07/2022   Lab Results   Component Value Date   NA 139 01/19/2022   K 4.5 01/19/2022   CL 105 01/19/2022   CO2 31 01/19/2022  Cr 0.59  Immunization status: Immunization History  Administered Date(s) Administered   Fluad Quad(high Dose 65+) 11/10/2019   Hepatitis A 06/17/2006, 01/05/2007   IPV 04/15/2012   Influenza Split 10/31/2013   Influenza, High Dose Seasonal PF 11/16/2016, 11/16/2017, 11/01/2018, 10/22/2021   Influenza,inj,quad, With Preservative 11/16/2017, 12/12/2018   Influenza-Unspecified 11/09/2014, 11/14/2015, 10/25/2020   PFIZER Comirnaty(Gray Top)Covid-19 Tri-Sucrose Vaccine 03/22/2020   PFIZER(Purple Top)SARS-COV-2 Vaccination 02/05/2019, 02/25/2019, 10/19/2019   Pfizer Covid-19 Vaccine Bivalent Booster 44yrs & up 11/07/2020   Pfizer Covid-19 Vaccine Bivalent Booster 5y-11y 11/19/2021   Pneumococcal Conjugate-13 07/20/2013   Pneumococcal Polysaccharide-23 06/17/2006, 11/26/2014, 09/05/2019   Rsv, Bivalent, Protein Subunit Rsvpref,pf Verdis Frederickson) 10/23/2019   Tdap 09/16/2004, 10/08/2014   Typhoid Live 06/17/2011, 06/10/2017   Unspecified SARS-COV-2 Vaccination 02/20/2019   Yellow Fever 07/08/2012   Zoster Recombinat (Shingrix) 04/28/2016, 08/15/2016   Zoster, Live 04/04/2009    Assessment:  Bronchiectasis without complication, mild, controlled History of pulmonary nocardiosis Chronic vasomotor rhinitis controlled OSA with excessive daytime sleepiness - managed with oral appliance by dentistry.  Chronic cough with LPR, controlled Tracheobronchomalacia (TBM) stable   Plan/Recommendations: Continue astelin and xyzal.  Continue oral appliance for OSA.  Continue prn albuterol  Continue acid reflux medication If you are developing signs or symptoms of pneumonia come see me sooner. I will give you a sputum cup to bring me a specimen if you're bringing up more mucus. Continue your IVIG infusions.   Return to Care: Return in about 1 year (around 06/24/2023).  Durel Salts, MD Pulmonary  and Critical Care Medicine [facility] Office:[phone]

## 2022-06-24 NOTE — Patient Instructions (Addendum)
Please schedule follow up scheduled with myself in 1 year.  If my schedule is not open yet, we will contact you with a reminder closer to that time. Please call [phone] if you haven't heard from [place] a month before.   Continue acid reflux medication If you are developing signs or symptoms of pneumonia come see me sooner. I will give you a sputum cup to bring me a specimen if you're bringing up more mucus.  Continue astelin and xyzal.  Continue oral appliance for OSA.  Continue prn albuterol   Continue your IVIG infusions.

## 2022-06-26 DIAGNOSIS — M5416 Radiculopathy, lumbar region: Secondary | ICD-10-CM | POA: Diagnosis not present

## 2022-06-29 ENCOUNTER — Other Ambulatory Visit (HOSPITAL_COMMUNITY): Payer: Self-pay

## 2022-06-29 ENCOUNTER — Other Ambulatory Visit: Payer: Self-pay

## 2022-06-29 DIAGNOSIS — C44329 Squamous cell carcinoma of skin of other parts of face: Secondary | ICD-10-CM | POA: Diagnosis not present

## 2022-06-29 MED ORDER — HYDROCODONE-ACETAMINOPHEN 7.5-325 MG PO TABS
1.0000 | ORAL_TABLET | Freq: Four times a day (QID) | ORAL | 0 refills | Status: DC | PRN
  Filled 2022-06-29: qty 120, 30d supply, fill #0

## 2022-07-01 ENCOUNTER — Encounter: Payer: Self-pay | Admitting: Internal Medicine

## 2022-07-02 ENCOUNTER — Other Ambulatory Visit (HOSPITAL_COMMUNITY): Payer: Self-pay

## 2022-07-02 ENCOUNTER — Other Ambulatory Visit: Payer: Self-pay

## 2022-07-02 DIAGNOSIS — D485 Neoplasm of uncertain behavior of skin: Secondary | ICD-10-CM | POA: Diagnosis not present

## 2022-07-02 DIAGNOSIS — C44729 Squamous cell carcinoma of skin of left lower limb, including hip: Secondary | ICD-10-CM | POA: Diagnosis not present

## 2022-07-02 DIAGNOSIS — H61032 Chondritis of left external ear: Secondary | ICD-10-CM | POA: Diagnosis not present

## 2022-07-02 DIAGNOSIS — C44722 Squamous cell carcinoma of skin of right lower limb, including hip: Secondary | ICD-10-CM | POA: Diagnosis not present

## 2022-07-02 DIAGNOSIS — C44529 Squamous cell carcinoma of skin of other part of trunk: Secondary | ICD-10-CM | POA: Diagnosis not present

## 2022-07-02 MED ORDER — VALACYCLOVIR HCL 500 MG PO TABS
500.0000 mg | ORAL_TABLET | Freq: Every day | ORAL | 1 refills | Status: DC
  Filled 2022-07-02: qty 90, 90d supply, fill #0
  Filled 2022-08-06 – 2022-08-14 (×2): qty 90, 90d supply, fill #1
  Filled 2022-09-06: qty 30, 30d supply, fill #1

## 2022-07-03 DIAGNOSIS — M5416 Radiculopathy, lumbar region: Secondary | ICD-10-CM | POA: Diagnosis not present

## 2022-07-07 ENCOUNTER — Ambulatory Visit: Payer: Medicare PPO | Admitting: Gastroenterology

## 2022-07-10 ENCOUNTER — Other Ambulatory Visit (HOSPITAL_COMMUNITY): Payer: Self-pay

## 2022-07-10 ENCOUNTER — Other Ambulatory Visit: Payer: Self-pay

## 2022-07-17 DIAGNOSIS — L538 Other specified erythematous conditions: Secondary | ICD-10-CM | POA: Diagnosis not present

## 2022-07-17 DIAGNOSIS — Z789 Other specified health status: Secondary | ICD-10-CM | POA: Diagnosis not present

## 2022-07-17 DIAGNOSIS — L570 Actinic keratosis: Secondary | ICD-10-CM | POA: Diagnosis not present

## 2022-07-17 DIAGNOSIS — R208 Other disturbances of skin sensation: Secondary | ICD-10-CM | POA: Diagnosis not present

## 2022-07-17 DIAGNOSIS — M5416 Radiculopathy, lumbar region: Secondary | ICD-10-CM | POA: Diagnosis not present

## 2022-07-17 DIAGNOSIS — D0472 Carcinoma in situ of skin of left lower limb, including hip: Secondary | ICD-10-CM | POA: Diagnosis not present

## 2022-07-17 DIAGNOSIS — D0471 Carcinoma in situ of skin of right lower limb, including hip: Secondary | ICD-10-CM | POA: Diagnosis not present

## 2022-07-17 DIAGNOSIS — D045 Carcinoma in situ of skin of trunk: Secondary | ICD-10-CM | POA: Diagnosis not present

## 2022-07-17 DIAGNOSIS — L298 Other pruritus: Secondary | ICD-10-CM | POA: Diagnosis not present

## 2022-07-17 DIAGNOSIS — L820 Inflamed seborrheic keratosis: Secondary | ICD-10-CM | POA: Diagnosis not present

## 2022-07-20 ENCOUNTER — Encounter: Payer: Self-pay | Admitting: Physician Assistant

## 2022-07-20 ENCOUNTER — Ambulatory Visit: Payer: Medicare PPO | Admitting: Physician Assistant

## 2022-07-20 ENCOUNTER — Other Ambulatory Visit: Payer: Self-pay | Admitting: Internal Medicine

## 2022-07-20 ENCOUNTER — Other Ambulatory Visit: Payer: Self-pay

## 2022-07-20 ENCOUNTER — Other Ambulatory Visit (HOSPITAL_COMMUNITY): Payer: Self-pay

## 2022-07-20 VITALS — BP 118/64 | HR 63 | Ht 64.0 in | Wt 155.0 lb

## 2022-07-20 DIAGNOSIS — K2100 Gastro-esophageal reflux disease with esophagitis, without bleeding: Secondary | ICD-10-CM | POA: Diagnosis not present

## 2022-07-20 DIAGNOSIS — K52832 Lymphocytic colitis: Secondary | ICD-10-CM

## 2022-07-20 DIAGNOSIS — E782 Mixed hyperlipidemia: Secondary | ICD-10-CM

## 2022-07-20 MED ORDER — ATORVASTATIN CALCIUM 40 MG PO TABS
40.0000 mg | ORAL_TABLET | Freq: Every day | ORAL | 1 refills | Status: DC
  Filled 2022-07-20: qty 90, 90d supply, fill #0
  Filled 2022-08-14 – 2022-10-20 (×2): qty 90, 90d supply, fill #1

## 2022-07-20 NOTE — Progress Notes (Signed)
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      12/05/2021 patient seen in clinic by Beverly [name] for GERD.  At that time noted her history of lymphocytic colitis diagnosed on colonoscopy 05/2020.  Also GERD with LPR symptoms well-controlled on Nexium 40 mg daily.  Had been treated with Protonix 40 twice daily since hospital admission in 10/2021 with UGIB.  At that visit continued on a high-dose PPI and recommended repeat EGD in November.    01/05/2022 EGD with patchy mild inflammation/erythema in the gastric fundus and body, small diverticulum in second portion of duodenum.    Today, patient presents to clinic and tells me that she is just here for her 71-month follow-up, things are doing well.  She is on Pantoprazole 40 mg every morning and Famotidine 40 mg nightly which is controlling her reflux symptoms well.  She also continues Budesonide 3 mg daily which controls her lymphocytic colitis.  She does have to take a MiraLAX/fiber shake in the morning to keep things moving, but if she stops the Budesonide she starts back with diarrhea.  She would like to continue all of her medications as they are and has no complaints or concerns.    Denies fever, chills, weight loss, abdominal pain, heartburn, reflux or symptoms that awaken her from sleep.  Endoscopic History: - Colonoscopy (05/2020): Benign rectal polyp, otherwise normal.  Biopsies with Lymphocytic Colitis.  Internal hemorrhoids.  Normal TI - EGD (11/05/2021, inpatient): LA Grade A esophagitis, 10 mm clean-based gastric ulcer (path: Gastritis, no H. pylori), Single diverticulum in D2.  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   right sholder   BCC (basal cell carcinoma of skin) 05/09/2014   mid lower back   BCC (basal cell carcinoma of skin) 05/03/2017   right low back   BCC (basal cell carcinoma of skin) 05/03/2017   left upper back   BCC (basal cell carcinoma of skin) 07/05/2018   left mid back   BCC (basal cell carcinoma of skin) 05/20/1992   upper back   BCC (basal cell carcinoma of skin) 07/29/1993   left sholder medial   BCC (basal cell carcinoma of skin) 07/29/1993   left sholder lateral   BCC (basal cell carcinoma of skin) 07/29/1993   right thigh   BCC (basal cell carcinoma of skin) 07/29/1993   right sholder   BCC (basal cell carcinoma of skin) 12/22/1994   right mid forearm   BCC (basal cell carcinoma of skin) 12/22/1994   right upper forearm   BCC (basal cell carcinoma of skin) 12/22/1994   lower right upper arm   BCC (basal cell carcinoma of skin) 12/22/1994   right upper arm sholder   BCC (basal cell carcinoma of skin) 08/11/1995   left leg below knee  BCC (basal cell carcinoma of skin) 04/11/2002   mid back   BCC (basal cell carcinoma of skin) 12/10/2002   right center upper back   BCC (basal cell carcinoma of skin) 05/26/2005   right post sholder   BCC (basal cell carcinoma) 05/09/2014   left inner shin   BCC (basal cell carcinoma) 06/12/2014   left forearm   Bowen's disease 10/07/1994   right post knee, right inner forearm/wrist   Bowen's disease 08/11/1995   left sholder   Bronchiectasis (HCC)    Cataract    left   Chronic pain    Common variable immunodeficiency [facility])    sees dr Ellin Goodie 02-11-2021 epic   Coronary artery disease     Depression    hx of   Diverticulosis of colon 1998   mild   DJD (degenerative joint disease)    Duodenal ulcer    h/o yrs ago   GERD (gastroesophageal reflux disease)    History of SCC (squamous cell carcinoma) of skin    Dr. Jorja Loa   History of sinus bradycardia    HLD (hyperlipidemia)    hypertriglyceridemia   Hypertensive retinopathy of both eyes 11/18/2017   Hypothyroidism    IBS (irritable bowel syndrome)    Internal hemorrhoids 1998   Lymphocytic colitis    Mitral regurgitation    mild   Nocardiosis    relased by infection disease dec 2022   Osteoarthritis    feet,shoulder,neck,back,hips and hands.   Pneumonia 2015   PONV (postoperative nausea and vomiting)    Rotator cuff tear, right 02/2019   infraspinatus and supraspinatus, and dislocation of long head of bicep tendon (Dr. Charlann Boxer)   SCC (squamous cell carcinoma) 11/26/2014   left hand, right hand, right deltoid mnay areas   SCC (squamous cell carcinoma) 05/03/2017   left cheek   Sleep apnea    uses a mouth guard nightly   Squamous cell carcinoma in situ (SCCIS) 07/05/2018   left hand   Tracheobronchomalacia    Vitamin D deficiency    mild    Past Surgical History:  Procedure Laterality Date   ABDOMINAL EXPOSURE N/A 02/02/2022   Procedure: ABDOMINAL EXPOSURE;  Surgeon: Cephus Shelling, MD;  Location: [facility] OR;  Service: Vascular;  Laterality: N/A;   ABDOMINAL HYSTERECTOMY  1998   complete   BIOPSY  11/05/2021   Procedure: BIOPSY;  Surgeon: Tressia Danas, MD;  Location[facility] ENDOSCOPY;  Service: Gastroenterology;;   BLEPHAROPLASTY Bilateral 01/2018   BROW LIFT Bilateral 10/10/2018   Procedure: Coralee Rud LIFT;  Surgeon: Peggye Form, DO;  Location: [facility];  Service: Plastics;  Laterality: Bilateral;   BUNIONECTOMY     R 12/08, L 2004 (Dr. Wynelle Cleveland)   CARDIAC CATHETERIZATION     CARPAL TUNNEL RELEASE  1989   bilateral   CARPAL TUNNEL RELEASE Left 03/06/2021   Procedure: Left  Revision Carpal Tunnel Release with hypothenar fat pad flap;  Surgeon: Gomez Cleverly, MD;  Location: [facility];  Service: Orthopedics;  Laterality: Left;  with local anesthesia   CATARACT EXTRACTION Bilateral Left in 08/2012, Right 04/2016   BeverlyHecker   CESAREAN SECTION     1981 and 1986   CHOLECYSTECTOMY  1992   laparoscopic   CLOSED REDUCTION NASAL FRACTURE N/A 08/29/2018   Procedure: CLOSED REDUCTION NASAL FRACTURE;  Surgeon: Peggye Form, DO;  Location: [facility];  Service: Plastics;  Laterality: N/A;  1 hour, please   COLONOSCOPY  2006  COLONOSCOPY  01/2012   due again 01/2021; mild diverticulosis   CORONARY STENT INTERVENTION N/A 05/19/2021   Procedure: CORONARY STENT INTERVENTION;  Surgeon: Yvonne Kendall, MD;  Location: [facility] INVASIVE CV LAB;  Service: Cardiovascular;  Laterality: N/A;   CORONARY ULTRASOUND/IVUS N/A 05/19/2021   Procedure: Intravascular Ultrasound/IVUS;  Surgeon: Yvonne Kendall, MD;  Location: [facility] INVASIVE CV LAB;  Service: Cardiovascular;  Laterality: N/A;   epidural steroid injection, back  02/2010   ESOPHAGOGASTRODUODENOSCOPY (EGD) WITH PROPOFOL N/A 11/05/2021   Procedure: ESOPHAGOGASTRODUODENOSCOPY (EGD) WITH PROPOFOL;  Surgeon: Tressia Danas, MD;  Location: [facility] ENDOSCOPY;  Service: Gastroenterology;  Laterality: N/A;   HIP SURGERY     right bursectomy x 3   HIP SURGERY Right 2000   torn cartilage, repaired   INGUINAL HERNIA REPAIR  10/2007   bilat   LEFT HEART CATH AND CORONARY ANGIOGRAPHY N/A 05/19/2021   Procedure: LEFT HEART CATH AND CORONARY ANGIOGRAPHY;  Surgeon: Yvonne Kendall, MD;  Location: [facility] INVASIVE CV LAB;  Service: Cardiovascular;  Laterality: N/A;   NECK SURGERY  1989   c6-7 cervical laminectomy and diskecotmy   REVERSE SHOULDER ARTHROPLASTY Right 04/11/2019   Procedure: REVERSE SHOULDER ARTHROPLASTY;  Surgeon: Yolonda Kida, MD;  Location: [facility] OR;  Service: Orthopedics;  Laterality: Right;   Regional Block   SHOULDER SURGERY Left 03/2003   left rotator cuff repair   SHOULDER SURGERY Left 03/14/2018   rotator cuff repair; Dr. Shelle Iron   TONSILLECTOMY  age 89   TROCHANTERIC BURSA EXCISION Right 1978   TUBAL LIGATION  1986   UPPER GASTROINTESTINAL ENDOSCOPY     WISDOM TOOTH EXTRACTION     as teenager    Current Outpatient Medications  Medication Sig Dispense Refill   albuterol (VENTOLIN HFA) 108 (90 Base) MCG/ACT inhaler Inhale 2 puffs into the lungs every 4 (four) hours as needed for wheezing or shortness of breath. 6.7 g 1   ARTIFICIAL TEAR OP Place 2 drops into both eyes in the morning and at bedtime.     atorvastatin (LIPITOR) 40 MG tablet Take 1 tablet (40 mg total) by mouth daily. 90 tablet 1   azelastine (ASTELIN) 0.1 % nasal spray Place 2 sprays into both nostrils 2 (two) times daily. Use in each nostril as directed (Patient not taking: Reported on 06/24/2022) 30 mL 5   budesonide (ENTOCORT EC) 3 MG 24 hr capsule Take 1 capsule (3 mg total) by mouth daily. 90 capsule 5   Buprenorphine HCl (BELBUCA) 600 MCG FILM Place 1 Film inside cheek 2 (two) times daily. Stop Belbuca 750mg  dose. 60 each 2   Buprenorphine HCl (BELBUCA) 750 MCG FILM Place 1 Film inside cheek 2 (two) times daily. 60 each 2   Buprenorphine HCl (BELBUCA) 750 MCG FILM Place 750 mcg inside cheek 2 (two) times daily. Stop . 60 each 2   Calcium Carbonate Antacid (TUMS PO) Take 1 tablet by mouth as needed (heartburn).     Calcium Carbonate-Vitamin D 600-200 MG-UNIT TABS Take 1 tablet by mouth 2 (two) times daily.     clopidogrel (PLAVIX) 75 MG tablet Take 1 tablet (75 mg total) by mouth daily. 90 tablet 3   colestipol (COLESTID) 1 g tablet Take 1 tablet (1 g total) by mouth daily. 90 tablet 1   Cyanocobalamin (VITAMIN B-12 PO) Take 1 capsule by mouth daily.     Docusate Sodium 100 MG capsule Take 100 mg by mouth in the morning and at bedtime.     EPINEPHrine 0.3 mg/0.3 mL IJ SOAJ  injection Inject 0.3 mg into  the muscle as needed for anaphylaxis.     famotidine (PEPCID) 40 MG tablet Take 1 tablet (40 mg total) by mouth at bedtime. 90 tablet 1   FIBER PO Take 2 tablets by mouth in the morning and at bedtime.     fluorouracil (EFUDEX) 5 % cream Apply two times a day to areas on left leg for 3 weeks. 40 g 0   gabapentin (NEURONTIN) 800 MG tablet Take 1 tablet by mouth at bedtime. (Patient taking differently: Take 800 mg by mouth at bedtime.) 90 tablet 2   guaiFENesin (MUCINEX) 600 MG 12 hr tablet Take 600 mg by mouth 2 (two) times daily.     HYDROcodone-acetaminophen (NORCO) 7.5-325 MG tablet Take 1 tablet by mouth 4 (four) times daily as needed for pain 120 tablet 0   ipratropium (ATROVENT) 0.06 % nasal spray Place 2 sprays into both nostrils 4 (four) times daily. 15 mL 5   Lactobacillus-Inulin (CULTURELLE ADULT ULT BALANCE) CAPS Take 1 capsule by mouth daily.     levocetirizine (XYZAL) 5 MG tablet Take 1 tablet (5 mg total) by mouth every evening. (Patient taking differently: Take 5 mg by mouth daily.) 90 tablet 5   levothyroxine (SYNTHROID) 25 MCG tablet Take 1 tablet (25 mcg total) by mouth daily. 90 tablet 2   loratadine (CLARITIN) 10 MG tablet Take 1 tablet (10 mg total) by mouth daily as needed for allergies (Can take an extra dose during flare ups.). (Patient not taking: Reported on 06/24/2022) 180 tablet 1   melatonin 5 MG TABS Take 5 mg by mouth at bedtime.     methocarbamol (ROBAXIN) 500 MG tablet Take 1 tablet (500 mg total) by mouth 2 (two) times daily as needed. 120 tablet 2   montelukast (SINGULAIR) 10 MG tablet Take 1 tablet (10 mg total) by mouth daily. 90 tablet 1   nitroGLYCERIN (NITROSTAT) 0.4 MG SL tablet Place 1 tablet (0.4 mg total) under the tongue every 5 (five) minutes as needed. 25 tablet 2   omega-3 acid ethyl esters (LOVAZA) 1 g capsule Take 2 capsules (2 g total) by mouth 2 (two) times daily. 360 capsule 1   ondansetron (ZOFRAN) 4 MG tablet Take 1 tablet (4 mg total) by mouth  every 8 (eight) hours as needed for nausea or vomiting. 20 tablet 0   pantoprazole (PROTONIX) 40 MG tablet Take 1 tablet (40 mg total) by mouth in the morning. 90 tablet 1   PARoxetine (PAXIL) 40 MG tablet Take 1 tablet (40 mg total) by mouth at bedtime. 90 tablet 1   PARoxetine (PAXIL) 40 MG tablet Take 1 tablet (40 mg total) by mouth daily. 90 tablet 1   polyethylene glycol (MIRALAX / GLYCOLAX) 17 g packet Take 17 g by mouth daily.     psyllium (METAMUCIL) 58.6 % packet Take 1 packet by mouth daily.     triamcinolone (NASACORT) 55 MCG/ACT AERO nasal inhaler Place 2 sprays into each nostril once daily. 16.9 g 5   valACYclovir (VALTREX) 500 MG tablet Take 1 tablet (500 mg total) by mouth daily. 90 tablet 1   XEMBIFY 10 GM/50ML SOLN Inject 10 g into the skin once a week. Wednesday     No current facility-administered medications for this visit.    Allergies as of 07/20/2022 - Review Complete 06/24/2022  Allergen Reaction Noted   Adhesive [tape] Rash 10/08/2010   Codeine Rash 10/08/2010    Family History  Problem Relation Age of Onset  Depression Mother    Schizophrenia Mother    Cancer Father        oral   Heart disease Father        bradycardia   Esophageal cancer Father    Cancer Sister        skin and lung   COPD Sister    Hypertension Sister    Osteoporosis Sister        compression fx's x 3 11/2018   Hyperthyroidism Brother    Cancer Brother        metastatic cancer to bone (?primary)   Diabetes Maternal Grandmother    Cancer Paternal Grandmother 41       colon cancer, metastatic to liver   Colon cancer Paternal Grandmother    Liver cancer Paternal Grandmother    Hyperlipidemia Daughter    Asthma Son    ADD / ADHD Other     Social History   Socioeconomic History   Marital status: Divorced    Spouse name: Not on file   Number of children: 2   Years of education: Not on file   Highest education level: Master's degree (e.g., MA, MS, MEng, MEd, MSW, MBA)   Occupational History   Occupation: STAFF Academic librarian: [employer]   Occupation: Retired  Tobacco Use   Smoking status: Never   Smokeless tobacco: Never  Vaping Use   Vaping Use: Never used  Substance and Sexual Activity   Alcohol use: Yes    Alcohol/week: 7.0 standard drinks of alcohol    Types: 7 Glasses of wine per week    Comment: 1 glass wine per day   Drug use: No   Sexual activity: Not Currently  Other Topics Concern   Not on file  Social History Narrative   Divorced, lives alone, [place]; 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   [place] for [employer] part-time   Social Determinants of Health   Financial Resource Strain: Low Risk  (05/17/2021)   Overall Financial Resource Strain (CARDIA)    Difficulty of Paying Living Expenses: Not hard at all  Food Insecurity: No Food Insecurity (05/17/2021)   Hunger Vital Sign    Worried About Running Out of Food in the Last Year: Never true    Ran Out of Food in the Last Year: Never true  Transportation Needs: No Transportation Needs (05/17/2021)   PRAPARE - Administrator, Civil Service (Medical): No    Lack of Transportation (Non-Medical): No  Physical Activity: Insufficiently Active (05/17/2021)   Exercise Vital Sign    Days of Exercise per Week: 1 day    Minutes of Exercise per Session: 10 min  Stress: Stress Concern Present (02/05/2022)   [employer] of Occupational Health - Occupational Stress Questionnaire    Feeling of Stress : Very much  Social Connections: Moderately Integrated (05/17/2021)   Social Connection and Isolation Panel [NHANES]    Frequency of Communication with Friends and Family: More than three times a week    Frequency of Social Gatherings with Friends and Family: More than three times a week    Attends Religious Services: More than 4 times per year     Active Member of [employer] or Organizations: Yes    Attends Engineer, structural: More than 4 times per year    Marital Status: Divorced  Intimate Partner Violence: Not  At Risk (02/04/2021)   Humiliation, Afraid, Rape, and Kick questionnaire    Fear of Current or Ex-Partner: No    Emotionally Abused: No    Physically Abused: No    Sexually Abused: No    Review of Systems:    Constitutional: No weight loss, fever or chills Cardiovascular: No chest pain  Respiratory: No SOB  Gastrointestinal: See HPI and otherwise negative   Physical Exam:  Vital signs: BP 118/64   Pulse 63   Ht 5\' 4"  (1.626 m)   Wt 155 lb (70.3 kg)   BMI 26.61 kg/m    Constitutional:   Pleasant Elderly Caucasian female appears to be in NAD, Well developed, Well nourished, alert and cooperative Respiratory: Respirations even and unlabored. Lungs clear to auscultation bilaterally.   No wheezes, crackles, or rhonchi.  Cardiovascular: Normal S1, S2. No MRG. Regular rate and rhythm. No peripheral edema, cyanosis or pallor.  Gastrointestinal:  Soft, nondistended, nontender. No rebound or guarding. Normal bowel sounds. No appreciable masses or hepatomegaly. Rectal:  Not performed.  Psychiatric: . Demonstrates good judgement and reason without abnormal affect or behaviors.  RELEVANT LABS AND IMAGING: CBC    Component Value Date/Time   WBC 5.7 01/07/2022 1538   RBC 3.84 01/07/2022 1538   HGB 12.1 01/07/2022 1538   HGB 11.9 06/03/2021 1244   HCT 36.4 01/07/2022 1538   HCT 35.3 06/03/2021 1244   PLT CANCELED 01/07/2022 1538   PLT 150 06/03/2021 1244   MCV 94.8 01/07/2022 1538   MCV 96 06/03/2021 1244   MCH 31.5 01/07/2022 1538   MCHC 33.2 01/07/2022 1538   RDW 14.8 01/07/2022 1538   RDW 14.2 06/03/2021 1244   LYMPHSABS 1,117 01/07/2022 1538   LYMPHSABS 1.5 08/22/2019 1054   MONOABS 0.5 12/18/2021 1526   EOSABS 51 01/07/2022 1538   EOSABS 0.1 08/22/2019 1054   BASOSABS 29 01/07/2022 1538    BASOSABS 0.0 08/22/2019 1054    CMP     Component Value Date/Time   NA 139 01/19/2022 1200   NA 142 06/03/2021 1244   K 4.5 01/19/2022 1200   CL 105 01/19/2022 1200   CO2 31 01/19/2022 1200   GLUCOSE 93 01/19/2022 1200   BUN 13 01/19/2022 1200   BUN 11 06/03/2021 1244   CREATININE 0.71 01/19/2022 1200   CREATININE 0.73 02/04/2021 1122   CALCIUM 9.1 01/19/2022 1200   PROT 6.6 11/20/2020 1630   PROT 6.3 01/11/2019 0815   ALBUMIN 4.0 11/20/2020 1630   ALBUMIN 4.4 01/11/2019 0815   AST 34 11/20/2020 1630   ALT 29 11/20/2020 1630   ALKPHOS 84 11/20/2020 1630   BILITOT 0.3 11/20/2020 1630   BILITOT 0.3 01/11/2019 0815   GFRNONAA >60 01/19/2022 1200   GFRAA >60 06/22/2019 0917    Assessment: 1.  GERD: With previous ulcer, repeat EGD in November with healing, doing well on Pantoprazole 40 mg every morning and Pepcid 40 mg nightly 2.  Lymphocytic colitis: Well-controlled on Budesonide 3 mg daily  Plan: 1.  Continue Budesonide 3 mg daily 2.  Continue MiraLAX and fiber supplement 3.  Continue Pantoprazole 40 mg every morning and Pepcid 40 mg nightly 4.  Patient to follow in clinic in a year or sooner if needed.  Hyacinth Meeker, PA-C [facility] 07/20/2022, 2:57 PM  Cc: Beverly [name], Beverly [name]*

## 2022-07-20 NOTE — Patient Instructions (Addendum)
Continue budesonide daily.  Continue pantoprazole 40 mg daily before breakfast and pepcid 40 mg at bedtime.  Follow up in one year.

## 2022-07-21 ENCOUNTER — Telehealth: Payer: Self-pay | Admitting: *Deleted

## 2022-07-21 ENCOUNTER — Other Ambulatory Visit (HOSPITAL_COMMUNITY): Payer: Self-pay

## 2022-07-21 ENCOUNTER — Other Ambulatory Visit: Payer: Self-pay

## 2022-07-21 DIAGNOSIS — E559 Vitamin D deficiency, unspecified: Secondary | ICD-10-CM

## 2022-07-21 DIAGNOSIS — E039 Hypothyroidism, unspecified: Secondary | ICD-10-CM

## 2022-07-21 DIAGNOSIS — E782 Mixed hyperlipidemia: Secondary | ICD-10-CM

## 2022-07-21 MED ORDER — GABAPENTIN 800 MG PO TABS
800.0000 mg | ORAL_TABLET | Freq: Every day | ORAL | 2 refills | Status: DC
  Filled 2022-07-21: qty 90, 90d supply, fill #0
  Filled 2022-10-17: qty 90, 90d supply, fill #1

## 2022-07-21 NOTE — Telephone Encounter (Signed)
-----   Message from Sherrill Raring, [facility] sent at 07/21/2022  9:40 AM EDT ----- Regarding: 2300 Pharmacy Referral Hello,   Patient is currently flagged as high risk for hospitalization and is eligible to meet with me through the Care Coordination program.   Could a 2300-Pharmacy referral for medication management be placed for the patient so that I can get them scheduled?   Thank you!  Sherrill Raring  Clinical Pharmacist  [phone]

## 2022-07-23 DIAGNOSIS — I129 Hypertensive chronic kidney disease with stage 1 through stage 4 chronic kidney disease, or unspecified chronic kidney disease: Secondary | ICD-10-CM | POA: Diagnosis not present

## 2022-07-23 DIAGNOSIS — K5900 Constipation, unspecified: Secondary | ICD-10-CM | POA: Diagnosis not present

## 2022-07-23 DIAGNOSIS — M8580 Other specified disorders of bone density and structure, unspecified site: Secondary | ICD-10-CM | POA: Diagnosis not present

## 2022-07-23 DIAGNOSIS — M1990 Unspecified osteoarthritis, unspecified site: Secondary | ICD-10-CM | POA: Diagnosis not present

## 2022-07-23 DIAGNOSIS — R001 Bradycardia, unspecified: Secondary | ICD-10-CM | POA: Diagnosis not present

## 2022-07-23 DIAGNOSIS — J4489 Other specified chronic obstructive pulmonary disease: Secondary | ICD-10-CM | POA: Diagnosis not present

## 2022-07-23 DIAGNOSIS — I2510 Atherosclerotic heart disease of native coronary artery without angina pectoris: Secondary | ICD-10-CM | POA: Diagnosis not present

## 2022-07-23 DIAGNOSIS — R32 Unspecified urinary incontinence: Secondary | ICD-10-CM | POA: Diagnosis not present

## 2022-07-23 DIAGNOSIS — Z9181 History of falling: Secondary | ICD-10-CM | POA: Diagnosis not present

## 2022-07-23 DIAGNOSIS — E039 Hypothyroidism, unspecified: Secondary | ICD-10-CM | POA: Diagnosis not present

## 2022-07-23 DIAGNOSIS — I700 Atherosclerosis of aorta: Secondary | ICD-10-CM | POA: Diagnosis not present

## 2022-07-23 DIAGNOSIS — F324 Major depressive disorder, single episode, in partial remission: Secondary | ICD-10-CM | POA: Diagnosis not present

## 2022-07-23 DIAGNOSIS — F419 Anxiety disorder, unspecified: Secondary | ICD-10-CM | POA: Diagnosis not present

## 2022-07-23 DIAGNOSIS — E785 Hyperlipidemia, unspecified: Secondary | ICD-10-CM | POA: Diagnosis not present

## 2022-07-23 DIAGNOSIS — Z809 Family history of malignant neoplasm, unspecified: Secondary | ICD-10-CM | POA: Diagnosis not present

## 2022-07-23 DIAGNOSIS — K760 Fatty (change of) liver, not elsewhere classified: Secondary | ICD-10-CM | POA: Diagnosis not present

## 2022-07-23 DIAGNOSIS — I739 Peripheral vascular disease, unspecified: Secondary | ICD-10-CM | POA: Diagnosis not present

## 2022-07-23 DIAGNOSIS — K219 Gastro-esophageal reflux disease without esophagitis: Secondary | ICD-10-CM | POA: Diagnosis not present

## 2022-07-23 NOTE — Progress Notes (Signed)
Agree with the assessment and plan as outlined by Jennifer Lemmon, PA-C. ? ?Sedric Guia, DO, FACG ? ?

## 2022-07-24 ENCOUNTER — Other Ambulatory Visit: Payer: Self-pay

## 2022-07-24 ENCOUNTER — Other Ambulatory Visit (HOSPITAL_COMMUNITY): Payer: Self-pay

## 2022-07-24 DIAGNOSIS — M5416 Radiculopathy, lumbar region: Secondary | ICD-10-CM | POA: Diagnosis not present

## 2022-07-24 DIAGNOSIS — M25561 Pain in right knee: Secondary | ICD-10-CM | POA: Diagnosis not present

## 2022-07-24 MED ORDER — METHYLPREDNISOLONE 4 MG PO TBPK
ORAL_TABLET | ORAL | 0 refills | Status: AC
  Filled 2022-07-24: qty 21, 6d supply, fill #0

## 2022-07-31 DIAGNOSIS — M5416 Radiculopathy, lumbar region: Secondary | ICD-10-CM | POA: Diagnosis not present

## 2022-08-05 ENCOUNTER — Other Ambulatory Visit (HOSPITAL_COMMUNITY): Payer: Self-pay

## 2022-08-05 DIAGNOSIS — M150 Primary generalized (osteo)arthritis: Secondary | ICD-10-CM | POA: Diagnosis not present

## 2022-08-05 DIAGNOSIS — Z79891 Long term (current) use of opiate analgesic: Secondary | ICD-10-CM | POA: Diagnosis not present

## 2022-08-05 DIAGNOSIS — M47816 Spondylosis without myelopathy or radiculopathy, lumbar region: Secondary | ICD-10-CM | POA: Diagnosis not present

## 2022-08-05 DIAGNOSIS — G894 Chronic pain syndrome: Secondary | ICD-10-CM | POA: Diagnosis not present

## 2022-08-05 MED ORDER — BELBUCA 750 MCG BU FILM
ORAL_FILM | BUCCAL | 2 refills | Status: DC
  Filled 2022-08-05: qty 60, 30d supply, fill #0
  Filled 2022-09-11: qty 60, 30d supply, fill #1

## 2022-08-05 MED ORDER — PAROXETINE HCL 20 MG PO TABS
20.0000 mg | ORAL_TABLET | Freq: Every day | ORAL | 0 refills | Status: DC
  Filled 2022-08-05: qty 30, 30d supply, fill #0

## 2022-08-06 ENCOUNTER — Other Ambulatory Visit: Payer: Self-pay | Admitting: Cardiology

## 2022-08-06 ENCOUNTER — Other Ambulatory Visit: Payer: Self-pay

## 2022-08-06 DIAGNOSIS — C44519 Basal cell carcinoma of skin of other part of trunk: Secondary | ICD-10-CM | POA: Diagnosis not present

## 2022-08-06 DIAGNOSIS — D485 Neoplasm of uncertain behavior of skin: Secondary | ICD-10-CM | POA: Diagnosis not present

## 2022-08-06 MED ORDER — NITROGLYCERIN 0.4 MG SL SUBL
0.4000 mg | SUBLINGUAL_TABLET | SUBLINGUAL | 10 refills | Status: DC | PRN
  Filled 2022-08-06: qty 25, 7d supply, fill #0

## 2022-08-07 ENCOUNTER — Encounter: Payer: Self-pay | Admitting: Internal Medicine

## 2022-08-10 ENCOUNTER — Ambulatory Visit: Payer: Medicare PPO | Admitting: Cardiovascular Disease

## 2022-08-13 DIAGNOSIS — C44519 Basal cell carcinoma of skin of other part of trunk: Secondary | ICD-10-CM | POA: Diagnosis not present

## 2022-08-14 ENCOUNTER — Other Ambulatory Visit (HOSPITAL_BASED_OUTPATIENT_CLINIC_OR_DEPARTMENT_OTHER): Payer: Self-pay

## 2022-08-14 ENCOUNTER — Other Ambulatory Visit: Payer: Self-pay

## 2022-08-14 DIAGNOSIS — Z4889 Encounter for other specified surgical aftercare: Secondary | ICD-10-CM | POA: Diagnosis not present

## 2022-08-17 ENCOUNTER — Other Ambulatory Visit (HOSPITAL_COMMUNITY): Payer: Self-pay

## 2022-08-25 ENCOUNTER — Other Ambulatory Visit (HOSPITAL_COMMUNITY): Payer: Self-pay

## 2022-08-25 ENCOUNTER — Ambulatory Visit: Payer: Medicare PPO | Admitting: Allergy and Immunology

## 2022-08-25 ENCOUNTER — Other Ambulatory Visit: Payer: Self-pay

## 2022-08-25 VITALS — BP 116/68 | HR 64 | Temp 96.8°F | Resp 16 | Ht 64.0 in | Wt 151.2 lb

## 2022-08-25 DIAGNOSIS — K219 Gastro-esophageal reflux disease without esophagitis: Secondary | ICD-10-CM

## 2022-08-25 DIAGNOSIS — K52832 Lymphocytic colitis: Secondary | ICD-10-CM | POA: Diagnosis not present

## 2022-08-25 DIAGNOSIS — J479 Bronchiectasis, uncomplicated: Secondary | ICD-10-CM

## 2022-08-25 DIAGNOSIS — M2559 Pain in other specified joint: Secondary | ICD-10-CM

## 2022-08-25 DIAGNOSIS — D839 Common variable immunodeficiency, unspecified: Secondary | ICD-10-CM

## 2022-08-25 MED ORDER — LORATADINE 10 MG PO TABS
10.0000 mg | ORAL_TABLET | Freq: Every day | ORAL | 1 refills | Status: DC | PRN
  Filled 2022-08-25: qty 90, 90d supply, fill #0

## 2022-08-25 MED ORDER — ALBUTEROL SULFATE HFA 108 (90 BASE) MCG/ACT IN AERS
2.0000 | INHALATION_SPRAY | RESPIRATORY_TRACT | 1 refills | Status: DC | PRN
  Filled 2022-08-25: qty 6.7, 17d supply, fill #0

## 2022-08-25 MED ORDER — FAMOTIDINE 40 MG PO TABS
40.0000 mg | ORAL_TABLET | Freq: Every evening | ORAL | 1 refills | Status: DC
  Filled 2022-08-25: qty 90, 90d supply, fill #0
  Filled 2022-12-03: qty 90, 90d supply, fill #1

## 2022-08-25 MED ORDER — PANTOPRAZOLE SODIUM 40 MG PO TBEC
40.0000 mg | DELAYED_RELEASE_TABLET | Freq: Every morning | ORAL | 1 refills | Status: DC
  Filled 2022-08-25: qty 90, 90d supply, fill #0
  Filled 2022-12-03: qty 90, 90d supply, fill #1

## 2022-08-25 MED ORDER — MONTELUKAST SODIUM 10 MG PO TABS
10.0000 mg | ORAL_TABLET | Freq: Every evening | ORAL | 1 refills | Status: DC
  Filled 2022-08-25 – 2022-10-12 (×2): qty 90, 90d supply, fill #0
  Filled 2023-01-07: qty 90, 90d supply, fill #1

## 2022-08-25 MED ORDER — IPRATROPIUM BROMIDE 0.06 % NA SOLN
2.0000 | Freq: Four times a day (QID) | NASAL | 1 refills | Status: DC | PRN
  Filled 2022-08-25: qty 45, 57d supply, fill #0

## 2022-08-25 NOTE — Patient Instructions (Addendum)
  1.  Continue immunoglobulin infusions every week  2.  Continue to treat reflex / LPR:   A.  Pantoprazole 40 mg in AM  B.  Famotidine 40 mg in PM   3.  Treat and prevent inflammation of upper airway:   A.  Montelukast 10 mg -1 tablet 1 time per day  4. If needed:  A. Loratadine 10 mg 1 tablet 1 time per day  B. Ipratropium 0.06% - 2 sprays each nostril every 6 hours (4x/day) C. Nasal saline D. Albuterol HFA - 2 inhalations every 4-6 hours  5. Blood - ANA w/R, RF, CCP  6. Continue budesonide therapy for lymphocytic colitis  7.  Return to clinic in 6 months or earlier if problem  8. Obtain fall flu vaccine

## 2022-08-25 NOTE — Progress Notes (Unsigned)
[facility] [place] [facility] [place] - [place]   Follow-up Note  Referring Provider: Philip [name], Beverly [name]* Primary Provider: Philip [name], Beverly Patricia, MD Date of Office Visit: 08/25/2022  Subjective:   Beverly [name] (DOB: 1945-10-01) is a 77 y.o. female who [facility] in re-evaluation of the following:  HPI: [name] returns to this clinic in evaluation of CVID, bronchiectasis, history of Nocardia pneumonia, history of asthma, history of reflux, history of lymphocytic colitis.  I last saw her in this clinic 24 February 2022.  She has not had an infection since I have seen her in this clinic.  She continues to receive immunoglobulin infusions.  She has not been having any issues with her breathing whether it involves her upper airway or lower airway.  She does have a little bit of a cough about 3 times per day but this does not really bother her very much and rarely requires any therapy.  She has her lymphocytic colitis under good control while using oral budesonide 3 mg a day and colestipol.  Her reflux has been under excellent control on her current therapy.  She tells me that she was taken off her Celebrex for a upper GI bleed last year and ever since then she has been having lots of joint pain and stiffness.  She would like to be checked for an autoimmune condition giving rise to her joint problems.  Allergies as of 08/25/2022       Reactions   Adhesive [tape] Rash   Blister    Codeine Rash        Medication List    albuterol 108 (90 Base) MCG/ACT inhaler Commonly known as: Ventolin HFA Inhale 2 puffs into the lungs every 4 (four) hours as needed for wheezing or shortness of breath.   ARTIFICIAL TEAR OP Place 2 drops into both eyes in the morning and at bedtime.   atorvastatin 40 MG tablet Commonly known as: LIPITOR Take 1 tablet (40 mg total) by mouth daily.   Azelastine HCl 137 MCG/SPRAY Soln Place 2  sprays into both nostrils 2 (two) times daily. Use in each nostril as directed   Belbuca 750 MCG Film Generic drug: Buprenorphine HCl Place 750 mcg inside cheek 2 (two) times daily. Stop .   Belbuca 750 MCG Film Generic drug: Buprenorphine HCl Place 1 film to inside of mouth (buccal) twice a day. **Stop belbuca 600 mcg   budesonide 3 MG 24 hr capsule Commonly known as: ENTOCORT EC Take 1 capsule (3 mg total) by mouth daily.   Calcium Carbonate-Vitamin D 600-200 MG-UNIT Tabs Take 1 tablet by mouth 2 (two) times daily.   clopidogrel 75 MG tablet Commonly known as: PLAVIX Take 1 tablet (75 mg total) by mouth daily.   colestipol 1 g tablet Commonly known as: COLESTID Take 1 tablet (1 g total) by mouth daily.   Culturelle Adult Ult Balance Caps Take 1 capsule by mouth daily.   Docusate Sodium 100 MG capsule Take 100 mg by mouth in the morning and at bedtime.   EPINEPHrine 0.3 mg/0.3 mL Soaj injection Commonly known as: EPI-PEN Inject 0.3 mg into the muscle as needed for anaphylaxis.   famotidine 40 MG tablet Commonly known as: Pepcid Take 1 tablet (40 mg total) by mouth at bedtime.   FIBER PO Take 2 tablets by mouth in the morning and at bedtime.   fluorouracil 5 % cream Commonly known as: Efudex Apply two times  a day to areas on left leg for 3 weeks.   gabapentin 800 MG tablet Commonly known as: NEURONTIN Take 1 tablet (800 mg total) by mouth at bedtime.   guaiFENesin 600 MG 12 hr tablet Commonly known as: MUCINEX Take 600 mg by mouth 2 (two) times daily.   HYDROcodone-acetaminophen 7.5-325 MG tablet Commonly known as: NORCO Take 1 tablet by mouth 4 (four) times daily as needed for pain   ipratropium 0.06 % nasal spray Commonly known as: ATROVENT Place 2 sprays into both nostrils 4 (four) times daily as needed for rhinitis.   levocetirizine 5 MG tablet Commonly known as: XYZAL Take 1 tablet (5 mg total) by mouth every evening.   levothyroxine 25 MCG  tablet Commonly known as: SYNTHROID Take 1 tablet (25 mcg total) by mouth daily.   loratadine 10 MG tablet Commonly known as: Claritin Take 1 tablet (10 mg total) by mouth daily as needed for allergies (Can take an extra dose during flare ups.).   melatonin 5 MG Tabs Take 5 mg by mouth at bedtime.   methocarbamol 500 MG tablet Commonly known as: ROBAXIN Take 1 tablet (500 mg total) by mouth 2 (two) times daily as needed.   montelukast 10 MG tablet Commonly known as: SINGULAIR Take 1 tablet (10 mg total) by mouth at bedtime.   nitroGLYCERIN 0.4 MG SL tablet Commonly known as: Nitrostat Place 1 tablet (0.4 mg total) under the tongue every 5 (five) minutes as needed.   omega-3 acid ethyl esters 1 g capsule Commonly known as: LOVAZA Take 2 capsules (2 g total) by mouth 2 (two) times daily.   pantoprazole 40 MG tablet Commonly known as: PROTONIX Take 1 tablet (40 mg total) by mouth in the morning.   PARoxetine 20 MG tablet Commonly known as: PAXIL Take 1 tablet (20 mg total) by mouth daily ose change stop paroxetine 40 mg   polyethylene glycol 17 g packet Commonly known as: MIRALAX / GLYCOLAX Take 17 g by mouth daily.   psyllium 58.6 % packet Commonly known as: METAMUCIL Take 1 packet by mouth daily.   TUMS PO Take 1 tablet by mouth as needed (heartburn).   valACYclovir 500 MG tablet Commonly known as: Valtrex Take 1 tablet (500 mg total) by mouth daily.   VITAMIN B-12 PO Take 1 capsule by mouth daily.   Xembify 10 GM/50ML Soln Generic drug: Immune Globulin (Human)-klhw Inject 10 g into the skin once a week. 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   right sholder   BCC (basal cell carcinoma of  skin) 05/09/2014   mid lower back   BCC (basal cell carcinoma of skin) 05/03/2017   right low back   BCC (basal cell carcinoma of skin) 05/03/2017   left upper back   BCC (basal cell carcinoma of skin) 07/05/2018   left mid back   BCC (basal cell carcinoma of skin) 05/20/1992   upper back   BCC (basal cell carcinoma of skin) 07/29/1993   left sholder medial   BCC (basal cell carcinoma of skin) 07/29/1993   left sholder lateral   BCC (basal cell carcinoma of skin) 07/29/1993   right  thigh   BCC (basal cell carcinoma of skin) 07/29/1993   right sholder   BCC (basal cell carcinoma of skin) 12/22/1994   right mid forearm   BCC (basal cell carcinoma of skin) 12/22/1994   right upper forearm   BCC (basal cell carcinoma of skin) 12/22/1994   lower right upper arm   BCC (basal cell carcinoma of skin) 12/22/1994   right upper arm sholder   BCC (basal cell carcinoma of skin) 08/11/1995   left leg below knee   BCC (basal cell carcinoma of skin) 04/11/2002   mid back   BCC (basal cell carcinoma of skin) 12/10/2002   right center upper back   BCC (basal cell carcinoma of skin) 05/26/2005   right post sholder   BCC (basal cell carcinoma) 05/09/2014   left inner shin   BCC (basal cell carcinoma) 06/12/2014   left forearm   Bowen's disease 10/07/1994   right post knee, right inner forearm/wrist   Bowen's disease 08/11/1995   left sholder   Bronchiectasis (HCC)    Cataract    left   Chronic pain    Common variable immunodeficiency [facility])    sees dr Ellin Goodie 02-11-2021 epic   Coronary artery disease    Depression    hx of   Diverticulosis of colon 1998   mild   DJD (degenerative joint disease)    Duodenal ulcer    h/o yrs ago   GERD (gastroesophageal reflux disease)    History of SCC (squamous cell carcinoma) of skin    Dr. Jorja Loa   History of sinus bradycardia    HLD (hyperlipidemia)    hypertriglyceridemia   Hypertensive retinopathy of both eyes 11/18/2017    Hypothyroidism    IBS (irritable bowel syndrome)    Internal hemorrhoids 1998   Lymphocytic colitis    Mitral regurgitation    mild   Nocardiosis    relased by infection disease dec 2022   Osteoarthritis    feet,shoulder,neck,back,hips and hands.   Pneumonia 2015   PONV (postoperative nausea and vomiting)    Rotator cuff tear, right 02/2019   infraspinatus and supraspinatus, and dislocation of long head of bicep tendon (Dr. Charlann Boxer)   SCC (squamous cell carcinoma) 11/26/2014   left hand, right hand, right deltoid mnay areas   SCC (squamous cell carcinoma) 05/03/2017   left cheek   Sleep apnea    uses a mouth guard nightly   Squamous cell carcinoma in situ (SCCIS) 07/05/2018   left hand   Tracheobronchomalacia    Vitamin D deficiency    mild    Past Surgical History:  Procedure Laterality Date   ABDOMINAL EXPOSURE N/A 02/02/2022   Procedure: ABDOMINAL EXPOSURE;  Surgeon: Cephus Shelling, MD;  Location: [facility] OR;  Service: Vascular;  Laterality: N/A;   ABDOMINAL HYSTERECTOMY  1998   complete   BIOPSY  11/05/2021   Procedure: BIOPSY;  Surgeon: Tressia Danas, MD;  Location: [facility] ENDOSCOPY;  Service: Gastroenterology;;   BLEPHAROPLASTY Bilateral 01/2018   BROW LIFT Bilateral 10/10/2018   Procedure: Coralee Rud LIFT;  Surgeon: Peggye Form, DO;  Location: [facility];  Service: Plastics;  Laterality: Bilateral;   BUNIONECTOMY     R 12/08, L 2004 (Dr. Wynelle Cleveland)   CARDIAC CATHETERIZATION     CARPAL TUNNEL RELEASE  1989   bilateral   CARPAL TUNNEL RELEASE Left 03/06/2021   Procedure: Left Revision Carpal Tunnel Release with hypothenar fat pad flap;  Surgeon: Gomez Cleverly, MD;  Location: [facility];  Service: Orthopedics;  Laterality: Left;  with local anesthesia   CATARACT EXTRACTION Bilateral Left in 08/2012, Right 04/2016   Dr.Hecker   CESAREAN SECTION     1981 and 1986   CHOLECYSTECTOMY  1992   laparoscopic   CLOSED REDUCTION NASAL  FRACTURE N/A 08/29/2018   Procedure: CLOSED REDUCTION NASAL FRACTURE;  Surgeon: Peggye Form, DO;  Location: [facility];  Service: Plastics;  Laterality: N/A;  1 hour, please   COLONOSCOPY  2006   COLONOSCOPY  01/2012   due again 01/2021; mild diverticulosis   CORONARY STENT INTERVENTION N/A 05/19/2021   Procedure: CORONARY STENT INTERVENTION;  Surgeon: Yvonne Kendall, MD;  Location: [facility];  Service: Cardiovascular;  Laterality: N/A;   CORONARY ULTRASOUND/IVUS N/A 05/19/2021   Procedure: Intravascular Ultrasound/IVUS;  Surgeon: Yvonne Kendall, MD;  Location: [facility];  Service: Cardiovascular;  Laterality: N/A;   epidural steroid injection, back  02/2010   ESOPHAGOGASTRODUODENOSCOPY (EGD) WITH PROPOFOL N/A 11/05/2021   Procedure: ESOPHAGOGASTRODUODENOSCOPY (EGD) WITH PROPOFOL;  Surgeon: Tressia Danas, MD;  Location: [facility] ENDOSCOPY;  Service: Gastroenterology;  Laterality: N/A;   HIP SURGERY     right bursectomy x 3   HIP SURGERY Right 2000   torn cartilage, repaired   INGUINAL HERNIA REPAIR  10/2007   bilat   LEFT HEART CATH AND CORONARY ANGIOGRAPHY N/A 05/19/2021   Procedure: LEFT HEART CATH AND CORONARY ANGIOGRAPHY;  Surgeon: Yvonne Kendall, MD;  Location: [facility];  Service: Cardiovascular;  Laterality: N/A;   NECK SURGERY  1989   c6-7 cervical laminectomy and diskecotmy   REVERSE SHOULDER ARTHROPLASTY Right 04/11/2019   Procedure: REVERSE SHOULDER ARTHROPLASTY;  Surgeon: Yolonda Kida, MD;  Location: [facility] OR;  Service: Orthopedics;  Laterality: Right;  Regional Block   SHOULDER SURGERY Left 03/2003   left rotator cuff repair   SHOULDER SURGERY Left 03/14/2018   rotator cuff repair; Dr. Shelle Iron   TONSILLECTOMY  age 44   TROCHANTERIC BURSA EXCISION Right 1978   TUBAL LIGATION  1986   UPPER GASTROINTESTINAL ENDOSCOPY     WISDOM TOOTH EXTRACTION     as teenager    Review of systems negative except as noted in HPI  / PMHx or noted below:  Review of Systems  Constitutional: Negative.   HENT: Negative.    Eyes: Negative.   Respiratory: Negative.    Cardiovascular: Negative.   Gastrointestinal: Negative.   Genitourinary: Negative.   Musculoskeletal: Negative.   Skin: Negative.   Neurological: Negative.   Endo/Heme/Allergies: Negative.   Psychiatric/Behavioral: Negative.       Objective:   Vitals:   08/25/22 1137  BP: 116/68  Pulse: 64  Resp: 16  Temp: (!) 96.8 F (36 C)  SpO2: 97%   Height: 5\' 4"  (162.6 cm)  Weight: 151 lb 3.2 oz (68.6 kg)   Physical Exam Constitutional:      Appearance: She is not diaphoretic.  HENT:     Head: Normocephalic.     Right Ear: Tympanic membrane, ear canal and external ear normal.     Left Ear: Tympanic membrane, ear canal and external ear normal.     Nose: Nose normal. No mucosal edema or rhinorrhea.     Mouth/Throat:     Pharynx: Uvula midline. No oropharyngeal exudate.  Eyes:     Conjunctiva/sclera: Conjunctivae normal.  Neck:     Thyroid: No thyromegaly.     Trachea: Trachea normal. No tracheal tenderness or tracheal deviation.  Cardiovascular:     Rate and Rhythm: Normal rate and regular rhythm.     Heart sounds: Normal heart sounds, S1 normal and S2 normal. No murmur heard. Pulmonary:     Effort: No respiratory distress.     Breath sounds: Normal breath sounds. No stridor. No wheezing or rales.  Lymphadenopathy:     Head:     Right side of head: No tonsillar adenopathy.     Left side of head: No tonsillar adenopathy.     Cervical: No cervical adenopathy.  Skin:    Findings: No erythema or rash.     Nails: There is no clubbing.  Neurological:     Mental Status: She is alert.     Diagnostics: Results of blood tests obtained 24 February 2022 identifies IgG 899 Mg/DL.    Assessment and Plan:   1. CVID (common variable immunodeficiency) (HCC)   2. Bronchiectasis without complication (HCC)   3. LPRD (laryngopharyngeal reflux  disease)   4. Lymphocytic colitis   5. Pain in other joint    1.  Continue immunoglobulin infusions every week  2.  Continue to treat reflex / LPR:   A.  Pantoprazole 40 mg in AM  B.  Famotidine 40 mg in PM   3.  Treat and prevent inflammation of upper airway:   A.  Montelukast 10 mg -1 tablet 1 time per day  4. If needed:  A. Loratadine 10 mg 1 tablet 1 time per day  B. Ipratropium 0.06% - 2 sprays each nostril every 6 hours (4x/day) C. Nasal saline D. Albuterol HFA - 2 inhalations every 4-6 hours  5. Blood - ANA w/R, RF, CCP  6. Continue budesonide therapy for lymphocytic colitis  7.  Return to clinic in 6 months or earlier if problem  8. Obtain fall flu vaccine  Dayja appears to be doing very well regarding her CVID, lung condition, LPR, lymphocytic colitis, on her current plan of therapy as noted above and she will continue on immunoglobulin infusions, montelukast, and a combination of pantoprazole and famotidine as well as some other medicines that can be used as needed.  She has arthralgia and she would like to be evaluated for an autoimmune condition and we will have her obtain the blood test noted above.  I did inform her that she is receiving immunoglobulin infusions and if she has low titers of positive blood test it will be very hard to interpret the results of these blood tests in the context of her immunoglobulin infusions.  If they are very high titer then it may be helpful signifying that she may truly have an autoimmune condition.  I will see her back in this clinic in 6 months or earlier if there is a problem.  Laurette Schimke, MD Allergy / Immunology [facility]

## 2022-08-26 ENCOUNTER — Encounter: Payer: Self-pay | Admitting: Allergy and Immunology

## 2022-08-26 ENCOUNTER — Other Ambulatory Visit (HOSPITAL_COMMUNITY): Payer: Self-pay

## 2022-08-26 DIAGNOSIS — M150 Primary generalized (osteo)arthritis: Secondary | ICD-10-CM | POA: Diagnosis not present

## 2022-08-26 DIAGNOSIS — Z79891 Long term (current) use of opiate analgesic: Secondary | ICD-10-CM | POA: Diagnosis not present

## 2022-08-26 DIAGNOSIS — M47816 Spondylosis without myelopathy or radiculopathy, lumbar region: Secondary | ICD-10-CM | POA: Diagnosis not present

## 2022-08-26 DIAGNOSIS — G894 Chronic pain syndrome: Secondary | ICD-10-CM | POA: Diagnosis not present

## 2022-08-26 LAB — CYCLIC CITRUL PEPTIDE ANTIBODY, IGG/IGA

## 2022-08-26 MED ORDER — VENLAFAXINE HCL ER 37.5 MG PO CP24
37.5000 mg | ORAL_CAPSULE | Freq: Every day | ORAL | 2 refills | Status: DC
  Filled 2022-08-26: qty 30, 30d supply, fill #0
  Filled 2022-09-18 – 2022-09-19 (×2): qty 30, 30d supply, fill #1

## 2022-08-27 DIAGNOSIS — M25561 Pain in right knee: Secondary | ICD-10-CM | POA: Diagnosis not present

## 2022-08-27 LAB — ANA W/REFLEX: Anti Nuclear Antibody (ANA): NEGATIVE

## 2022-08-27 LAB — RHEUMATOID FACTOR: Rheumatoid fact SerPl-aCnc: <10 IU/mL (ref <14.0)

## 2022-09-01 DIAGNOSIS — M79642 Pain in left hand: Secondary | ICD-10-CM | POA: Diagnosis not present

## 2022-09-03 ENCOUNTER — Encounter: Payer: Self-pay | Admitting: Internal Medicine

## 2022-09-07 ENCOUNTER — Other Ambulatory Visit (HOSPITAL_COMMUNITY): Payer: Self-pay

## 2022-09-07 MED ORDER — VALACYCLOVIR HCL 500 MG PO TABS
500.0000 mg | ORAL_TABLET | Freq: Every day | ORAL | 1 refills | Status: DC
  Filled 2022-09-07: qty 90, 90d supply, fill #0

## 2022-09-11 ENCOUNTER — Other Ambulatory Visit: Payer: Self-pay

## 2022-09-11 ENCOUNTER — Other Ambulatory Visit (HOSPITAL_COMMUNITY): Payer: Self-pay

## 2022-09-13 NOTE — Progress Notes (Unsigned)
Office Visit Note  Patient: Beverly [name]             Date of Birth: 01-22-1946           MRN: [id number]             PCP: [name], Limmie Patricia, MD Referring: [name], [name] Visit Date: 09/17/2022   Subjective:  Follow-up (Patient states her knees are bothering her but she is seeing an orthopedist. Patient states she is seeing a pain management doctor and they told her her hands are getting worse and that she has nodules and bumps. Patient states she recently had some labs drawn. Patient states she does Xembify IVIG infusions and that can effect her lab results. )   History of Present Illness: Beverly [name] is a 77 y.o. female here for follow up here for evaluation concern for inflammatory arthritis with ongoing joint pain in multiple areas.  We last saw her in December 2022 at the time of her symptoms of lower extremity joint pain and swelling but workup was unremarkable for any underlying autoimmune process.  She sees Dr. Vear Clock for pain management long-term on treatment with Belbuca.  Follow-up with orthopedics clinic had a trigger finger steroid injection by Dr. Melvyn Novas.  Unfortunately during last year had a series of events developed a gastrointestinal bleeding ulcer attributed to chronic Celebrex and had to discontinue the medicine.  During subsequent workup found to have coronary artery obstructions at multiple areas treated with stenting and started on Plavix.  Since having to stop all NSAIDs for this her chronic joint pain is somewhat worse overall.  Due to widespread worsening of symptoms there was concern for autoimmune cause. Of note she does have CVID with chronic hypogammaglobulinemia confounding laboratory test results.   Previous HPI 01/20/21 Beverly [name] is a 77 y.o. female with medical history of CVID, bronchiectasis, Nocardia pneumonia, asthma, and lymphocytic colitis here for evaluation of pains and heaviness in bilateral legs.  She has  longstanding history of chronic back pain also previous history of muscular and tendon inflammation of the gluteals and proximal legs followed with pain management.  However her current complaint is bilateral distal lower extremity pain that started abruptly in October.  Pain is localized to the lateral half of the calf on both legs no extension into the knee or to the ankle or below.  Pain is worse first thing in the morning and when starting to move from a prolonged resting position there is no increase in pain with weightbearing position or any range of motion.  She denies any radiation or extension into the foot or any loss of sensation.  She has not had any weakness or gait instability.  During this time there has been no comparable or new symptoms elsewhere.  She denies any swelling or visible changes or erythema.  Symptoms are responsive to the low-dose Norco when she takes this as needed.  Medical events around this time include trial of discontinuing the budesonide for lymphocytic colitis though she resumed this on a slower taper currently down to 3 daily.  She was also diagnosed with hypothyroidism with T4 of 0.48 but started on supplementation since October.    Labs reviewed 11/2020 TSH wnl T4 0.48 CBC Plts 117 Iron sat 13.2%   05/23/19 MRI Right hip IMPRESSION: 1. Moderate bilateral hip joint degenerative changes but no stress fracture or AVN. 2. Moderate peritendinitis mainly involving the gluteus medius tendon but no tendon rupture. 3. Inflammation/edema  involving the proximal vastus lateralis along its musculotendinous junction. This could be due to a muscle strain or partial tear. 4. No significant intrapelvic abnormalities.   Review of Systems  Constitutional:  Positive for fatigue.  HENT:  Positive for mouth dryness. Negative for mouth sores.   Eyes:  Negative for dryness.  Respiratory:  Negative for shortness of breath.   Cardiovascular:  Negative for chest pain and  palpitations.  Gastrointestinal:  Negative for blood in stool, constipation and diarrhea.  Endocrine: Negative for increased urination.  Genitourinary:  Positive for involuntary urination.  Musculoskeletal:  Positive for joint pain, gait problem, joint pain, joint swelling, myalgias, morning stiffness, muscle tenderness and myalgias. Negative for muscle weakness.  Skin:  Positive for hair loss and sensitivity to sunlight. Negative for color change and rash.  Allergic/Immunologic: Positive for susceptible to infections.  Neurological:  Negative for dizziness and headaches.  Hematological:  Negative for swollen glands.  Psychiatric/Behavioral:  Positive for sleep disturbance. Negative for depressed mood. The patient is not nervous/anxious.     PMFS History:  Patient Active Problem List   Diagnosis Date Noted   S/P lumbar fusion 02/02/2022   ABLA (acute blood loss anemia) 11/05/2021   Personal history of gastric ulcer    GI bleed 11/04/2021   Tracheobronchomalacia 11/04/2021   Normocytic anemia 11/04/2021   Hyperglycemia 11/04/2021   Changing skin lesion 08/01/2021   CAD (coronary artery disease) 05/20/2021   Shortness of breath 05/19/2021   Abnormal cardiac CT angiography 05/19/2021   Degenerative spondylolisthesis 03/05/2021   Spinal stenosis of lumbar region 03/05/2021   Lumbar pain 03/05/2021   Common variable immunodeficiency (CVID) 11/29/2020   Bronchiectasis without complication (HCC) 11/29/2020   Lymphocytic colitis 11/29/2020   Pain in both lower extremities 11/29/2020   Hypothyroidism 11/22/2020   Vitamin D deficiency 11/21/2020   Allergies 08/28/2020   Hypogammaglobulinemia (HCC) 08/06/2020   Diarrhea 03/13/2020   Acid-fast bacteria present 02/20/2020   Carpal tunnel syndrome of left wrist 02/09/2020   Nocardia infection 01/11/2020   Medication monitoring encounter 01/11/2020   Bilateral hand pain 10/12/2019   OSA (obstructive sleep apnea) 07/31/2019   Chest pain  06/22/2019   Interstitial lung disease (HCC) 06/22/2019   S/P reverse total shoulder arthroplasty, right 04/11/2019   History of reverse total replacement of right shoulder joint 04/11/2019   Aortic atherosclerosis (HCC) 04/10/2019   Hardening of the aorta (main artery of the heart) (HCC) 04/10/2019   Brow ptosis 07/28/2018   Facial trauma 07/28/2018   Nasal fracture 07/28/2018   Ptosis of eyebrow 07/28/2018   Injury of face 07/28/2018   Fractured nasal bones 07/28/2018   Tear of left rotator cuff 02/22/2018   Abnormal chest sounds 10/16/2016   Moderate persistent asthma without complication 09/18/2016   Moderate persistent asthma 09/18/2016   Transient visual loss of both eyes 03/16/2016   Transient visual loss, bilateral 03/16/2016   Amaurosis fugax 01/09/2015   Postmenopausal symptoms 11/26/2014   Menopausal and postmenopausal disorder 11/26/2014   SIADH (syndrome of inappropriate ADH production) (HCC) 11/26/2014   Left shoulder pain 10/02/2014   Shoulder pain 10/02/2014   Right hip pain 09/05/2014   Pain in joint of right hip 09/05/2014   Unspecified asthma, with exacerbation 04/25/2012   Bronchitis, acute 04/25/2012   LPRD (laryngopharyngeal reflux disease) 04/06/2011   Asthmatic bronchitis 04/06/2011   Allergic rhinitis 04/06/2011   Mixed hyperlipidemia 04/06/2011   Chronic back pain 04/06/2011   Gastroesophageal reflux disease 04/06/2011   Cough 02/20/2011  Skin cancer 07/17/1977    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   right sholder   BCC (basal cell carcinoma of skin) 05/09/2014   mid lower back   BCC (basal cell carcinoma of skin) 05/03/2017   right low back   BCC (basal cell carcinoma of skin) 05/03/2017    left upper back   BCC (basal cell carcinoma of skin) 07/05/2018   left mid back   BCC (basal cell carcinoma of skin) 05/20/1992   upper back   BCC (basal cell carcinoma of skin) 07/29/1993   left sholder medial   BCC (basal cell carcinoma of skin) 07/29/1993   left sholder lateral   BCC (basal cell carcinoma of skin) 07/29/1993   right thigh   BCC (basal cell carcinoma of skin) 07/29/1993   right sholder   BCC (basal cell carcinoma of skin) 12/22/1994   right mid forearm   BCC (basal cell carcinoma of skin) 12/22/1994   right upper forearm   BCC (basal cell carcinoma of skin) 12/22/1994   lower right upper arm   BCC (basal cell carcinoma of skin) 12/22/1994   right upper arm sholder   BCC (basal cell carcinoma of skin) 08/11/1995   left leg below knee   BCC (basal cell carcinoma of skin) 04/11/2002   mid back   BCC (basal cell carcinoma of skin) 12/10/2002   right center upper back   BCC (basal cell carcinoma of skin) 05/26/2005   right post sholder   BCC (basal cell carcinoma) 05/09/2014   left inner shin   BCC (basal cell carcinoma) 06/12/2014   left forearm   Bowen's disease 10/07/1994   right post knee, right inner forearm/wrist   Bowen's disease 08/11/1995   left sholder   Bronchiectasis (HCC)    Cataract    left   Chronic pain    Common variable immunodeficiency (HCC)    sees dr Ellin Goodie 02-11-2021 epic   Coronary artery disease    Depression    hx of   Diverticulosis of colon 1998   mild   DJD (degenerative joint disease)    Duodenal ulcer    h/o yrs ago   GERD (gastroesophageal reflux disease)    History of SCC (squamous cell carcinoma) of skin    Dr. Jorja Loa   History of sinus bradycardia    HLD (hyperlipidemia)    hypertriglyceridemia   Hypertensive retinopathy of both eyes 11/18/2017   Hypothyroidism    IBS (irritable bowel syndrome)    Internal hemorrhoids 1998   Lymphocytic colitis    Mitral regurgitation    mild   Nocardiosis    relased  by infection disease dec 2022   Osteoarthritis    feet,shoulder,neck,back,hips and hands.   Pneumonia 2015   PONV (postoperative nausea and vomiting)    Rotator cuff tear, right 02/2019   infraspinatus and supraspinatus, and dislocation of long head of bicep tendon (Dr. Charlann Boxer)   SCC (squamous cell carcinoma) 11/26/2014   left hand, right hand, right deltoid mnay areas   SCC (squamous cell carcinoma) 05/03/2017   left cheek   Sleep apnea    uses a mouth guard nightly   Squamous  cell carcinoma in situ (SCCIS) 07/05/2018   left hand   Tracheobronchomalacia    Trigger finger    left hand ring finger   Vitamin D deficiency    mild    Family History  Problem Relation Age of Onset   Depression Mother    Schizophrenia Mother    Cancer Father        oral   Heart disease Father        bradycardia   Esophageal cancer Father    Cancer Sister        skin and lung   COPD Sister    Hypertension Sister    Osteoporosis Sister        compression fx's x 3 11/2018   Hyperthyroidism Brother    Cancer Brother        metastatic cancer to bone (?primary)   Diabetes Maternal Grandmother    Cancer Paternal Grandmother 12       colon cancer, metastatic to liver   Colon cancer Paternal Grandmother    Liver cancer Paternal Grandmother    Hyperlipidemia Daughter    Asthma Son    ADD / ADHD Other    Past Surgical History:  Procedure Laterality Date   ABDOMINAL EXPOSURE N/A 02/02/2022   Procedure: ABDOMINAL EXPOSURE;  Surgeon: Cephus Shelling, MD;  Location: [facility] OR;  Service: Vascular;  Laterality: N/A;   ABDOMINAL HYSTERECTOMY  1998   complete   BIOPSY  11/05/2021   Procedure: BIOPSY;  Surgeon: Tressia Danas, MD;  Location: [facility] ENDOSCOPY;  Service: Gastroenterology;;   BLEPHAROPLASTY Bilateral 01/2018   BROW LIFT Bilateral 10/10/2018   Procedure: Coralee Rud LIFT;  Surgeon: Peggye Form, DO;  Location: [facility];  Service: Plastics;  Laterality: Bilateral;    BUNIONECTOMY     R 12/08, L 2004 (Dr. Wynelle Cleveland)   CARDIAC CATHETERIZATION     CARPAL TUNNEL RELEASE  1989   bilateral   CARPAL TUNNEL RELEASE Left 03/06/2021   Procedure: Left Revision Carpal Tunnel Release with hypothenar fat pad flap;  Surgeon: Gomez Cleverly, MD;  Location: [facility];  Service: Orthopedics;  Laterality: Left;  with local anesthesia   CATARACT EXTRACTION Bilateral Left in 08/2012, Right 04/2016   Dr.Hecker   CESAREAN SECTION     1981 and 1986   CHOLECYSTECTOMY  1992   laparoscopic   CLOSED REDUCTION NASAL FRACTURE N/A 08/29/2018   Procedure: CLOSED REDUCTION NASAL FRACTURE;  Surgeon: Peggye Form, DO;  Location: [facility];  Service: Plastics;  Laterality: N/A;  1 hour, please   COLONOSCOPY  2006   COLONOSCOPY  01/2012   due again 01/2021; mild diverticulosis   CORONARY STENT INTERVENTION N/A 05/19/2021   Procedure: CORONARY STENT INTERVENTION;  Surgeon: Yvonne Kendall, MD;  Location: [facility] INVASIVE [facility];  Service: Cardiovascular;  Laterality: N/A;   CORONARY ULTRASOUND/IVUS N/A 05/19/2021   Procedure: Intravascular Ultrasound/IVUS;  Surgeon: Yvonne Kendall, MD;  Location: [facility];  Service: Cardiovascular;  Laterality: N/A;   epidural steroid injection, back  02/2010   ESOPHAGOGASTRODUODENOSCOPY (EGD) WITH PROPOFOL N/A 11/05/2021   Procedure: ESOPHAGOGASTRODUODENOSCOPY (EGD) WITH PROPOFOL;  Surgeon: Tressia Danas, MD;  Location: [facility] ENDOSCOPY;  Service: Gastroenterology;  Laterality: N/A;   HIP SURGERY     right bursectomy x 3   HIP SURGERY Right 2000   torn cartilage, repaired   INGUINAL HERNIA REPAIR  10/2007   bilat   LEFT HEART CATH AND CORONARY ANGIOGRAPHY N/A 05/19/2021   Procedure:  LEFT HEART CATH AND CORONARY ANGIOGRAPHY;  Surgeon: Yvonne Kendall, MD;  Location: [facility];  Service: Cardiovascular;  Laterality: N/A;   LUMBAR FUSION  01/2022   NECK SURGERY  1989   c6-7 cervical laminectomy  and diskecotmy   REVERSE SHOULDER ARTHROPLASTY Right 04/11/2019   Procedure: REVERSE SHOULDER ARTHROPLASTY;  Surgeon: Yolonda Kida, MD;  Location: [facility] OR;  Service: Orthopedics;  Laterality: Right;  Regional Block   SHOULDER SURGERY Left 03/2003   left rotator cuff repair   SHOULDER SURGERY Left 03/14/2018   rotator cuff repair; Dr. 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   [place] for [employer] part-time   Immunization History  Administered Date(s) Administered   Fluad Quad(high Dose 65+) 11/10/2019   Hepatitis A 06/17/2006, 01/05/2007   IPV 04/15/2012   Influenza Split 10/31/2013   Influenza, High Dose Seasonal PF 11/16/2016, 11/16/2017, 11/01/2018, 10/22/2021   Influenza,inj,quad, With Preservative 11/16/2017, 12/12/2018   Influenza-Unspecified 11/09/2014, 11/14/2015, 10/25/2020   PFIZER Comirnaty(Gray Top)Covid-19 Tri-Sucrose Vaccine 03/22/2020   PFIZER(Purple Top)SARS-COV-2 Vaccination 02/05/2019, 02/25/2019, 10/19/2019   Pfizer Covid-19 Vaccine Bivalent Booster 1yrs & up 11/07/2020   Pfizer Covid-19 Vaccine Bivalent Booster 5y-11y 11/19/2021   Pneumococcal Conjugate-13 07/20/2013   Pneumococcal Polysaccharide-23 06/17/2006, 11/26/2014, 09/05/2019, 09/21/2022   Rsv, Bivalent, Protein Subunit Rsvpref,pf Verdis Frederickson) 10/23/2019   Tdap 09/16/2004, 10/08/2014   Typhoid Live 06/17/2011, 06/10/2017   Unspecified SARS-COV-2 Vaccination 02/20/2019   Yellow Fever 07/08/2012   Zoster Recombinant(Shingrix) 04/28/2016, 08/15/2016   Zoster, Live 04/04/2009     Objective: Vital Signs: BP 132/79 (BP  Location: Left Arm, Patient Position: Sitting, Cuff Size: Normal)   Pulse 75   Resp 14   Ht 5\' 4"  (1.626 m)   Wt 150 lb (68 kg)   BMI 25.75 kg/m    Physical Exam Eyes:     Conjunctiva/sclera: Conjunctivae normal.  Cardiovascular:     Rate and Rhythm: Normal rate and regular rhythm.  Pulmonary:     Effort: Pulmonary effort is normal.     Breath sounds: Normal breath sounds.  Musculoskeletal:     Right lower leg: No edema.     Left lower leg: No edema.  Lymphadenopathy:     Cervical: No cervical adenopathy.  Skin:    General: Skin is warm and dry.     Findings: No rash.  Neurological:     Mental Status: She is alert.  Psychiatric:        Mood and Affect: Mood normal.      Musculoskeletal Exam:  Elbows full ROM no tenderness or swelling Wrists full ROM no tenderness or swelling Fingers full ROM heberdon's nodes present bilaterally, no palpable flexor tendon nodules, no synovitis No paraspinal tenderness to palpation over upper and lower back Hip normal internal and external rotation without pain, no tenderness to lateral hip palpation Knees full ROM no tenderness or swelling   Investigation: No additional findings.  Imaging: No results found.  Recent Labs: Lab Results  Component Value Date   WBC 5.7 01/07/2022   HGB 12.1 01/07/2022   PLT CANCELED 01/07/2022   NA  139 01/19/2022   K 4.5 01/19/2022   CL 105 01/19/2022   CO2 31 01/19/2022   GLUCOSE 93 01/19/2022   BUN 13 01/19/2022   CREATININE 0.71 01/19/2022   BILITOT 0.3 11/20/2020   ALKPHOS 84 11/20/2020   AST 34 11/20/2020   ALT 29 11/20/2020   PROT 6.6 11/20/2020   ALBUMIN 4.0 11/20/2020   CALCIUM 9.1 01/19/2022   GFRAA >60 06/22/2019    Speciality Comments: No specialty comments available.  Procedures:  No procedures performed Allergies: Adhesive [tape] and Codeine   Assessment / Plan:     Visit Diagnoses: Bilateral hand pain - Plan: Sedimentation rate, C-reactive protein, Mutated  Citrullinated Vimentin (MCV) Antibody  Some increase in bilateral hand pain I think this is due to her underlying osteoarthritis and is worse because she had to stop oral NSAIDs due to gastrointestinal bleeding and coronary artery disease.  I do not see any concerning findings for peripheral joint synovitis on exam today.  Will recheck serum inflammatory markers also MCV antibody but have a low pretest suspicion for rheumatoid arthritis.  Discussed some additional supplemental or topical treatment options for osteoarthritis since she cannot take oral NSAIDs.  Common variable immunodeficiency (CVID)  Currently appears to be under control without recent complicated or excessive infection frequency.  Certainly can decrease sensitivity for antibody testing.  Personal history of gastric ulcer  Complicated series of events with NSAID induced GI bleeding this occurred on Celebrex so would not recommend any systemic NSAID treatments again.  If beneficial I think she is okay to use topical treatment due to low systemic absorption so should not impact gastric mucosa.  Orders: Orders Placed This Encounter  Procedures   Sedimentation rate   C-reactive protein   Mutated Citrullinated Vimentin (MCV) Antibody   No orders of the defined types were placed in this encounter.    Follow-Up Instructions: No follow-ups on file.   Fuller Plan, MD  Note - This record has been created using [employer].  Chart creation errors have been sought, but may not always  have been located. Such creation errors do not reflect on  the standard of medical care.

## 2022-09-14 ENCOUNTER — Ambulatory Visit: Payer: Medicare PPO | Admitting: Internal Medicine

## 2022-09-17 ENCOUNTER — Encounter: Payer: Self-pay | Admitting: Internal Medicine

## 2022-09-17 ENCOUNTER — Other Ambulatory Visit (HOSPITAL_COMMUNITY): Payer: Self-pay

## 2022-09-17 ENCOUNTER — Ambulatory Visit: Payer: Medicare PPO | Attending: Internal Medicine | Admitting: Internal Medicine

## 2022-09-17 VITALS — BP 132/79 | HR 75 | Resp 14 | Ht 64.0 in | Wt 150.0 lb

## 2022-09-17 DIAGNOSIS — M79642 Pain in left hand: Secondary | ICD-10-CM

## 2022-09-17 DIAGNOSIS — M79641 Pain in right hand: Secondary | ICD-10-CM

## 2022-09-17 DIAGNOSIS — D839 Common variable immunodeficiency, unspecified: Secondary | ICD-10-CM | POA: Diagnosis not present

## 2022-09-17 DIAGNOSIS — Z8711 Personal history of peptic ulcer disease: Secondary | ICD-10-CM

## 2022-09-17 DIAGNOSIS — M25561 Pain in right knee: Secondary | ICD-10-CM | POA: Diagnosis not present

## 2022-09-17 NOTE — Patient Instructions (Signed)
For osteoarthritis several treatments may be beneficial:  - Topical antiinflammatory medicine such as diclofenac or Voltaren can be applied to  affected area as needed. Topical analgesics containing CBD, menthol, or lidocaine can be tried.  - Oral nonsteroidal antiinflammatory drugs (NSAIDs) such as ibuprofen, aleve, celebrex, or mobic are usually helpful for osteoarthritis. These should be taken intermittently or as needed, and always taken with food.  - Turmeric has some antiinflammatory effect similar to NSAIDs and may help, if taken as a supplement should not be taken above recommended doses.   - Compressive gloves or sleeve can be helpful to support the joint especially if hurting or swelling with certain activities.  - Physical therapy referral can discuss exercises or activity modification to improve symptoms or strength if needed.  - Local steroid injection is an option if symptoms become worse and not controlled by the above options.

## 2022-09-18 ENCOUNTER — Other Ambulatory Visit (HOSPITAL_COMMUNITY): Payer: Self-pay

## 2022-09-18 DIAGNOSIS — M25562 Pain in left knee: Secondary | ICD-10-CM | POA: Diagnosis not present

## 2022-09-18 DIAGNOSIS — M25561 Pain in right knee: Secondary | ICD-10-CM | POA: Diagnosis not present

## 2022-09-19 ENCOUNTER — Other Ambulatory Visit (HOSPITAL_COMMUNITY): Payer: Self-pay

## 2022-09-21 ENCOUNTER — Other Ambulatory Visit (HOSPITAL_COMMUNITY): Payer: Self-pay

## 2022-09-21 ENCOUNTER — Other Ambulatory Visit (HOSPITAL_BASED_OUTPATIENT_CLINIC_OR_DEPARTMENT_OTHER): Payer: Self-pay

## 2022-09-21 MED ORDER — PNEUMOCOCCAL VAC POLYVALENT 25 MCG/0.5ML IJ INJ
0.5000 mL | INJECTION | Freq: Once | INTRAMUSCULAR | 0 refills | Status: AC
  Filled 2022-09-21: qty 0.5, 1d supply, fill #0

## 2022-09-22 DIAGNOSIS — M65312 Trigger thumb, left thumb: Secondary | ICD-10-CM | POA: Diagnosis not present

## 2022-09-23 ENCOUNTER — Other Ambulatory Visit (HOSPITAL_BASED_OUTPATIENT_CLINIC_OR_DEPARTMENT_OTHER): Payer: Self-pay

## 2022-09-23 ENCOUNTER — Other Ambulatory Visit (HOSPITAL_COMMUNITY): Payer: Self-pay

## 2022-09-23 DIAGNOSIS — G894 Chronic pain syndrome: Secondary | ICD-10-CM | POA: Diagnosis not present

## 2022-09-23 DIAGNOSIS — M150 Primary generalized (osteo)arthritis: Secondary | ICD-10-CM | POA: Diagnosis not present

## 2022-09-23 DIAGNOSIS — Z79891 Long term (current) use of opiate analgesic: Secondary | ICD-10-CM | POA: Diagnosis not present

## 2022-09-23 DIAGNOSIS — M47816 Spondylosis without myelopathy or radiculopathy, lumbar region: Secondary | ICD-10-CM | POA: Diagnosis not present

## 2022-09-23 MED ORDER — METHOCARBAMOL 500 MG PO TABS
500.0000 mg | ORAL_TABLET | Freq: Three times a day (TID) | ORAL | 2 refills | Status: DC | PRN
  Filled 2022-09-23: qty 270, 90d supply, fill #0
  Filled 2023-03-08: qty 270, 90d supply, fill #1

## 2022-09-23 MED ORDER — VENLAFAXINE HCL ER 37.5 MG PO CP24
37.5000 mg | ORAL_CAPSULE | Freq: Every day | ORAL | 2 refills | Status: DC
  Filled 2022-09-23: qty 90, 90d supply, fill #0

## 2022-09-24 ENCOUNTER — Other Ambulatory Visit (HOSPITAL_COMMUNITY): Payer: Self-pay

## 2022-09-24 DIAGNOSIS — M1711 Unilateral primary osteoarthritis, right knee: Secondary | ICD-10-CM | POA: Diagnosis not present

## 2022-09-24 MED ORDER — BELBUCA 750 MCG BU FILM
750.0000 ug | ORAL_FILM | Freq: Two times a day (BID) | BUCCAL | 2 refills | Status: DC
  Filled 2022-10-12: qty 60, 30d supply, fill #0
  Filled 2022-11-13: qty 60, 30d supply, fill #1

## 2022-10-12 ENCOUNTER — Other Ambulatory Visit (HOSPITAL_COMMUNITY): Payer: Self-pay

## 2022-10-12 ENCOUNTER — Other Ambulatory Visit: Payer: Self-pay

## 2022-10-13 ENCOUNTER — Other Ambulatory Visit: Payer: Self-pay

## 2022-10-14 ENCOUNTER — Other Ambulatory Visit (HOSPITAL_COMMUNITY): Payer: Self-pay

## 2022-10-16 ENCOUNTER — Other Ambulatory Visit: Payer: Self-pay | Admitting: *Deleted

## 2022-10-16 ENCOUNTER — Telehealth: Payer: Self-pay | Admitting: Internal Medicine

## 2022-10-16 ENCOUNTER — Other Ambulatory Visit: Payer: Medicare PPO

## 2022-10-16 DIAGNOSIS — J479 Bronchiectasis, uncomplicated: Secondary | ICD-10-CM | POA: Diagnosis not present

## 2022-10-16 NOTE — Telephone Encounter (Signed)
Called and there was no answer- LMTCB 

## 2022-10-16 NOTE — Telephone Encounter (Signed)
Disregard. She will call her PCP.

## 2022-10-16 NOTE — Telephone Encounter (Signed)
Pt calling in returning missed call.

## 2022-10-17 ENCOUNTER — Telehealth: Payer: Medicare PPO | Admitting: Nurse Practitioner

## 2022-10-17 ENCOUNTER — Other Ambulatory Visit (HOSPITAL_BASED_OUTPATIENT_CLINIC_OR_DEPARTMENT_OTHER): Payer: Self-pay

## 2022-10-17 ENCOUNTER — Encounter: Payer: Self-pay | Admitting: Allergy and Immunology

## 2022-10-17 ENCOUNTER — Encounter: Payer: Self-pay | Admitting: Internal Medicine

## 2022-10-17 DIAGNOSIS — U071 COVID-19: Secondary | ICD-10-CM | POA: Diagnosis not present

## 2022-10-17 NOTE — Progress Notes (Signed)
Virtual Visit Consent   Beverly [name], you are scheduled for a virtual visit with a [facility] provider today. Just as with appointments in the office, your consent must be obtained to participate. Your consent will be active for this visit and any virtual visit you may have with one of our providers in the next 365 days. If you have a MyChart account, a copy of this consent can be sent to you electronically.  As this is a virtual visit, video technology does not allow for your provider to perform a traditional examination. This may limit your provider's ability to fully assess your condition. If your provider identifies any concerns that need to be evaluated in person or the need to arrange testing (such as labs, EKG, etc.), we will make arrangements to do so. Although advances in technology are sophisticated, we cannot ensure that it will always work on either your end or our end. If the connection with a video visit is poor, the visit may have to be switched to a telephone visit. With either a video or telephone visit, we are not always able to ensure that we have a secure connection.  By engaging in this virtual visit, you consent to the provision of healthcare and authorize for your insurance to be billed (if applicable) for the services provided during this visit. Depending on your insurance coverage, you may receive a charge related to this service.  I need to obtain your verbal consent now. Are you willing to proceed with your visit today? Beverly [name] has provided verbal consent on 10/17/2022 for a virtual visit (video or telephone). Claiborne Rigg, NP  Date: 10/17/2022 8:57 AM  Virtual Visit via Video Note   I, Claiborne Rigg, connected with  Beverly [name]  ([id number], 75-Oct-1947) on 10/17/22 at  8:45 AM EDT by a video-enabled telemedicine application and verified that I am speaking with the correct person using two identifiers.  Location: Patient: Virtual Visit Location  Patient: Home Provider: Virtual Visit Location Provider: Home Office   I discussed the limitations of evaluation and management by telemedicine and the availability of in person appointments. The patient expressed understanding and agreed to proceed.    History of Present Illness: Beverly [name] is a 77 y.o. who identifies as a female who was assigned female at birth, and is being seen today for covid positive test.   [name] tested positive for COVID x2  via home antigen test. She is experiencing the following: sore throat. Symptoms started 3 days ago and she also received the Moderna Covid vaccine 3 days ago. She is taking tylenol for her pain relief which is noted as effective in relieving her symptoms. She also notes a cough but states this is chronic due to her history of bronchiectasis. She has a sputum culture pending with her specialist. She reports being out of the country in [place] for a cruise recently as well.    Problems:  Patient Active Problem List   Diagnosis Date Noted   S/P lumbar fusion 02/02/2022   ABLA (acute blood loss anemia) 11/05/2021   Personal history of gastric ulcer    GI bleed 11/04/2021   Tracheobronchomalacia 11/04/2021   Normocytic anemia 11/04/2021   Hyperglycemia 11/04/2021   Changing skin lesion 08/01/2021   CAD (coronary artery disease) 05/20/2021   Shortness of breath 05/19/2021   Abnormal cardiac CT angiography 05/19/2021   Degenerative spondylolisthesis 03/05/2021   Spinal stenosis of lumbar region 03/05/2021  Lumbar pain 03/05/2021   Common variable immunodeficiency (CVID) 11/29/2020   Bronchiectasis without complication (HCC) 11/29/2020   Lymphocytic colitis 11/29/2020   Pain in both lower extremities 11/29/2020   Hypothyroidism 11/22/2020   Vitamin D deficiency 11/21/2020   Allergies 08/28/2020   Hypogammaglobulinemia (HCC) 08/06/2020   Diarrhea 03/13/2020   Acid-fast bacteria present 02/20/2020   Carpal tunnel syndrome  of left wrist 02/09/2020   Nocardia infection 01/11/2020   Medication monitoring encounter 01/11/2020   Bilateral hand pain 10/12/2019   OSA (obstructive sleep apnea) 07/31/2019   Chest pain 06/22/2019   Interstitial lung disease (HCC) 06/22/2019   S/P reverse total shoulder arthroplasty, right 04/11/2019   History of reverse total replacement of right shoulder joint 04/11/2019   Aortic atherosclerosis (HCC) 04/10/2019   Hardening of the aorta (main artery of the heart) (HCC) 04/10/2019   Brow ptosis 07/28/2018   Facial trauma 07/28/2018   Nasal fracture 07/28/2018   Ptosis of eyebrow 07/28/2018   Injury of face 07/28/2018   Fractured nasal bones 07/28/2018   Tear of left rotator cuff 02/22/2018   Abnormal chest sounds 10/16/2016   Moderate persistent asthma without complication 09/18/2016   Moderate persistent asthma 09/18/2016   Transient visual loss of both eyes 03/16/2016   Transient visual loss, bilateral 03/16/2016   Amaurosis fugax 01/09/2015   Postmenopausal symptoms 11/26/2014   Menopausal and postmenopausal disorder 11/26/2014   SIADH (syndrome of inappropriate ADH production) (HCC) 11/26/2014   Left shoulder pain 10/02/2014   Shoulder pain 10/02/2014   Right hip pain 09/05/2014   Pain in joint of right hip 09/05/2014   Unspecified asthma, with exacerbation 04/25/2012   Bronchitis, acute 04/25/2012   LPRD (laryngopharyngeal reflux disease) 04/06/2011   Asthmatic bronchitis 04/06/2011   Allergic rhinitis 04/06/2011   Mixed hyperlipidemia 04/06/2011   Chronic back pain 04/06/2011   Gastroesophageal reflux disease 04/06/2011   Cough 02/20/2011   Skin cancer 07/17/1977    Allergies:  Allergies  Allergen Reactions   Adhesive [Tape] Rash    Blister    Codeine Rash    Observations/Objective: Patient is well-developed, well-nourished in no acute distress.  Resting comfortably at home.  Head is normocephalic, atraumatic.  No labored breathing. Speech is clear  and coherent with logical content.  Patient is alert and oriented at baseline.    Assessment and Plan: 1. Positive self-administered antigen test for COVID-19 Watchful monitoring at this time. Will hold off on paxlovid as symptoms are mild and she also recently received Moderna vaccine 3 days ago.   Follow Up Instructions: I discussed the assessment and treatment plan with the patient. The patient was provided an opportunity to ask questions and all were answered. The patient agreed with the plan and demonstrated an understanding of the instructions.  A copy of instructions were sent to the patient via MyChart unless otherwise noted below.    The patient was advised to call back or seek an in-person evaluation if the symptoms worsen or if the condition fails to improve as anticipated.  Time:  I spent 12 minutes with the patient via telehealth technology discussing the above problems/concerns.    Claiborne Rigg, NP

## 2022-10-17 NOTE — Patient Instructions (Signed)
Beverly [name], thank you for joining Claiborne Rigg, NP for today's virtual visit.  While this provider is not your primary care provider (PCP), if your PCP is located in our provider database this encounter information will be shared with them immediately following your visit.   A [facility] MyChart account gives you access to today's visit and all your visits, tests, and labs performed at [facility] " click here if you don't have a [name] MyChart account or go to [email or web address]  Consent: (Patient) Beverly [name] provided verbal consent for this virtual visit at the beginning of the encounter.  Current Medications:  Current Outpatient Medications:    acetaminophen (TYLENOL) 650 MG CR tablet, SMARTSIG:1 Tablet(s) By Mouth, Disp: , Rfl:    albuterol (VENTOLIN HFA) 108 (90 Base) MCG/ACT inhaler, Inhale 2 puffs into the lungs every 4 (four) hours as needed for wheezing or shortness of breath., Disp: 6.7 g, Rfl: 1   amoxicillin (AMOXIL) 500 MG tablet, , Disp: , Rfl:    ARTIFICIAL TEAR OP, Place 2 drops into both eyes in the morning and at bedtime., Disp: , Rfl:    atorvastatin (LIPITOR) 40 MG tablet, Take 1 tablet (40 mg total) by mouth daily., Disp: 90 tablet, Rfl: 1   azelastine (ASTELIN) 0.1 % nasal spray, Place 2 sprays into both nostrils 2 (two) times daily. Use in each nostril as directed (Patient not taking: Reported on 09/17/2022), Disp: 30 mL, Rfl: 5   budesonide (ENTOCORT EC) 3 MG 24 hr capsule, Take 1 capsule (3 mg total) by mouth daily., Disp: 90 capsule, Rfl: 5   Buprenorphine HCl (BELBUCA) 750 MCG FILM, Place 750 mcg inside cheek 2 (two) times daily. Stop ., Disp: 60 each, Rfl: 2   Buprenorphine HCl (BELBUCA) 750 MCG FILM, Place 1 film to inside of mouth (buccal) twice a day. **Stop belbuca 600 mcg, Disp: 60 each, Rfl: 2   Buprenorphine HCl (BELBUCA) 750 MCG FILM, Place 1 film (750 mcg) inside cheek 2 times daily., Disp: 60 each, Rfl: 2    Calcium Carbonate Antacid (TUMS PO), Take 1 tablet by mouth as needed (heartburn)., Disp: , Rfl:    Calcium Carbonate-Vitamin D 600-200 MG-UNIT TABS, Take 1 tablet by mouth 2 (two) times daily., Disp: , Rfl:    clopidogrel (PLAVIX) 75 MG tablet, Take 1 tablet (75 mg total) by mouth daily., Disp: 90 tablet, Rfl: 3   colestipol (COLESTID) 1 g tablet, Take 1 tablet (1 g total) by mouth daily., Disp: 90 tablet, Rfl: 1   Cyanocobalamin (VITAMIN B-12 PO), Take 1 capsule by mouth daily. (Patient not taking: Reported on 09/17/2022), Disp: , Rfl:    Docusate Sodium 100 MG capsule, Take 100 mg by mouth in the morning and at bedtime., Disp: , Rfl:    EPINEPHrine 0.3 mg/0.3 mL IJ SOAJ injection, Inject 0.3 mg into the muscle as needed for anaphylaxis., Disp: , Rfl:    famotidine (PEPCID) 40 MG tablet, Take 1 tablet (40 mg total) by mouth at bedtime., Disp: 90 tablet, Rfl: 1   FIBER PO, Take 2 tablets by mouth in the morning and at bedtime., Disp: , Rfl:    fluorouracil (EFUDEX) 5 % cream, Apply two times a day to areas on left leg for 3 weeks., Disp: 40 g, Rfl: 0   gabapentin (NEURONTIN) 800 MG tablet, Take 1 tablet (800 mg total) by mouth at bedtime., Disp: 90 tablet, Rfl: 2   guaiFENesin (MUCINEX) 600 MG 12 hr tablet, Take 600  mg by mouth 2 (two) times daily., Disp: , Rfl:    HYDROcodone-acetaminophen (NORCO) 7.5-325 MG tablet, Take 1 tablet by mouth 4 (four) times daily as needed for pain, Disp: 120 tablet, Rfl: 0   ipratropium (ATROVENT) 0.06 % nasal spray, Place 2 sprays into both nostrils 4 (four) times daily as needed for rhinitis., Disp: 45 mL, Rfl: 1   Lactobacillus-Inulin (CULTURELLE ADULT ULT BALANCE) CAPS, Take 1 capsule by mouth daily., Disp: , Rfl:    levocetirizine (XYZAL) 5 MG tablet, Take 1 tablet (5 mg total) by mouth every evening. (Patient taking differently: Take 5 mg by mouth daily.), Disp: 90 tablet, Rfl: 5   levothyroxine (SYNTHROID) 25 MCG tablet, Take 1 tablet (25 mcg total) by mouth  daily., Disp: 90 tablet, Rfl: 2   loperamide (IMODIUM A-D) 2 MG tablet, Take by mouth., Disp: , Rfl:    loratadine (CLARITIN) 10 MG tablet, Take 1 tablet (10 mg total) by mouth daily as needed for allergies (Can take an extra dose during flare ups.)., Disp: 180 tablet, Rfl: 1   melatonin 5 MG TABS, Take 5 mg by mouth at bedtime., Disp: , Rfl:    methocarbamol (ROBAXIN) 500 MG tablet, Take 1 tablet (500 mg total) by mouth 2 (two) times daily as needed., Disp: 120 tablet, Rfl: 2   methocarbamol (ROBAXIN) 500 MG tablet, Take 1 tablet (500 mg total) by mouth 3 (three) times daily as needed., Disp: 270 tablet, Rfl: 2   montelukast (SINGULAIR) 10 MG tablet, Take 1 tablet (10 mg total) by mouth at bedtime., Disp: 90 tablet, Rfl: 1   nitroGLYCERIN (NITROSTAT) 0.4 MG SL tablet, Place 1 tablet (0.4 mg total) under the tongue every 5 (five) minutes as needed., Disp: 25 tablet, Rfl: 10   omega-3 acid ethyl esters (LOVAZA) 1 g capsule, Take 2 capsules (2 g total) by mouth 2 (two) times daily., Disp: 360 capsule, Rfl: 1   pantoprazole (PROTONIX) 40 MG tablet, Take 1 tablet (40 mg total) by mouth in the morning., Disp: 90 tablet, Rfl: 1   PARoxetine (PAXIL) 20 MG tablet, Take 1 tablet (20 mg total) by mouth daily. dose change stop paroxetine 40 mg, Disp: 30 tablet, Rfl: 0   polyethylene glycol (MIRALAX / GLYCOLAX) 17 g packet, Take 17 g by mouth daily., Disp: , Rfl:    psyllium (METAMUCIL) 58.6 % packet, Take 1 packet by mouth daily., Disp: , Rfl:    valACYclovir (VALTREX) 500 MG tablet, Take 1 tablet (500 mg total) by mouth daily., Disp: 90 tablet, Rfl: 1   venlafaxine XR (EFFEXOR XR) 37.5 MG 24 hr capsule, Take 1 capsule (37.5 mg total) by mouth daily. (Stop paxil), Disp: 30 capsule, Rfl: 2   venlafaxine XR (EFFEXOR XR) 37.5 MG 24 hr capsule, Take 1 capsule (37.5 mg total) by mouth daily., Disp: 90 capsule, Rfl: 2   XEMBIFY 10 GM/50ML SOLN, Inject 10 g into the skin once a week. Wednesday, Disp: , Rfl:     Medications ordered in this encounter:  No orders of the defined types were placed in this encounter.    *If you need refills on other medications prior to your next appointment, please contact your pharmacy*  Follow-Up: Call back or seek an in-person evaluation if the symptoms worsen or if the condition fails to improve as anticipated.  [facility] [phone]  Other Instructions Watchful monitoring at this time. Will hold off on paxlovid as symptoms are mild and she also recently received Moderna vaccine  3 days ago.   If you have been instructed to have an in-person evaluation today at a local Urgent Care facility, please use the link below. It will take you to a list of all of our available [facility], including address, phone number and hours of operation. Please do not delay care.  [facility]  If you or a family member do not have a primary care provider, use the link below to schedule a visit and establish care. When you choose a [facility] primary care physician or advanced practice provider, you gain a long-term partner in health. Find a Primary Care Provider  Learn more about [facility] in-office and virtual care options: [facility] - Get Care Now

## 2022-10-18 LAB — RESPIRATORY CULTURE OR RESPIRATORY AND SPUTUM CULTURE: MICRO NUMBER:: 15405825

## 2022-10-19 ENCOUNTER — Encounter: Payer: Self-pay | Admitting: Nurse Practitioner

## 2022-10-19 ENCOUNTER — Telehealth: Payer: Medicare PPO | Admitting: Physician Assistant

## 2022-10-19 DIAGNOSIS — U071 COVID-19: Secondary | ICD-10-CM | POA: Diagnosis not present

## 2022-10-19 MED ORDER — NIRMATRELVIR/RITONAVIR (PAXLOVID)TABLET
3.0000 | ORAL_TABLET | Freq: Two times a day (BID) | ORAL | 0 refills | Status: AC
Start: 2022-10-19 — End: 2022-10-24

## 2022-10-19 NOTE — Patient Instructions (Signed)
[name], thank you for joining Margaretann Loveless, PA-C for today's virtual visit.  While this provider is not your primary care provider (PCP), if your PCP is located in our provider database this encounter information will be shared with them immediately following your visit.   A [facility] MyChart account gives you access to today's visit and all your visits, tests, and labs performed at [facility] " click here if you don't have a [facility] MyChart account or go to [email or web address]  Consent: (Patient) SHAVNA GERRY provided verbal consent for this virtual visit at the beginning of the encounter.  Current Medications:  Current Outpatient Medications:    nirmatrelvir/ritonavir (PAXLOVID) 20 x 150 MG & 10 x 100MG  TABS, Take 3 tablets by mouth 2 (two) times daily for 5 days. (Take nirmatrelvir 150 mg two tablets twice daily for 5 days and ritonavir 100 mg one tablet twice daily for 5 days) Patient GFR is greater than 60, Disp: 30 tablet, Rfl: 0   acetaminophen (TYLENOL) 650 MG CR tablet, SMARTSIG:1 Tablet(s) By Mouth, Disp: , Rfl:    albuterol (VENTOLIN HFA) 108 (90 Base) MCG/ACT inhaler, Inhale 2 puffs into the lungs every 4 (four) hours as needed for wheezing or shortness of breath., Disp: 6.7 g, Rfl: 1   amoxicillin (AMOXIL) 500 MG tablet, , Disp: , Rfl:    ARTIFICIAL TEAR OP, Place 2 drops into both eyes in the morning and at bedtime., Disp: , Rfl:    atorvastatin (LIPITOR) 40 MG tablet, Take 1 tablet (40 mg total) by mouth daily., Disp: 90 tablet, Rfl: 1   azelastine (ASTELIN) 0.1 % nasal spray, Place 2 sprays into both nostrils 2 (two) times daily. Use in each nostril as directed (Patient not taking: Reported on 09/17/2022), Disp: 30 mL, Rfl: 5   budesonide (ENTOCORT EC) 3 MG 24 hr capsule, Take 1 capsule (3 mg total) by mouth daily., Disp: 90 capsule, Rfl: 5   Buprenorphine HCl (BELBUCA) 750 MCG FILM, Place 750 mcg inside cheek 2 (two) times daily. Stop  ., Disp: 60 each, Rfl: 2   Buprenorphine HCl (BELBUCA) 750 MCG FILM, Place 1 film to inside of mouth (buccal) twice a day. **Stop belbuca 600 mcg, Disp: 60 each, Rfl: 2   Buprenorphine HCl (BELBUCA) 750 MCG FILM, Place 1 film (750 mcg) inside cheek 2 times daily., Disp: 60 each, Rfl: 2   Calcium Carbonate Antacid (TUMS PO), Take 1 tablet by mouth as needed (heartburn)., Disp: , Rfl:    Calcium Carbonate-Vitamin D 600-200 MG-UNIT TABS, Take 1 tablet by mouth 2 (two) times daily., Disp: , Rfl:    clopidogrel (PLAVIX) 75 MG tablet, Take 1 tablet (75 mg total) by mouth daily., Disp: 90 tablet, Rfl: 3   colestipol (COLESTID) 1 g tablet, Take 1 tablet (1 g total) by mouth daily., Disp: 90 tablet, Rfl: 1   Cyanocobalamin (VITAMIN B-12 PO), Take 1 capsule by mouth daily. (Patient not taking: Reported on 09/17/2022), Disp: , Rfl:    Docusate Sodium 100 MG capsule, Take 100 mg by mouth in the morning and at bedtime., Disp: , Rfl:    EPINEPHrine 0.3 mg/0.3 mL IJ SOAJ injection, Inject 0.3 mg into the muscle as needed for anaphylaxis., Disp: , Rfl:    famotidine (PEPCID) 40 MG tablet, Take 1 tablet (40 mg total) by mouth at bedtime., Disp: 90 tablet, Rfl: 1   FIBER PO, Take 2 tablets by mouth in the morning and at bedtime., Disp: , Rfl:  fluorouracil (EFUDEX) 5 % cream, Apply two times a day to areas on left leg for 3 weeks., Disp: 40 g, Rfl: 0   gabapentin (NEURONTIN) 800 MG tablet, Take 1 tablet (800 mg total) by mouth at bedtime., Disp: 90 tablet, Rfl: 2   guaiFENesin (MUCINEX) 600 MG 12 hr tablet, Take 600 mg by mouth 2 (two) times daily., Disp: , Rfl:    HYDROcodone-acetaminophen (NORCO) 7.5-325 MG tablet, Take 1 tablet by mouth 4 (four) times daily as needed for pain, Disp: 120 tablet, Rfl: 0   ipratropium (ATROVENT) 0.06 % nasal spray, Place 2 sprays into both nostrils 4 (four) times daily as needed for rhinitis., Disp: 45 mL, Rfl: 1   Lactobacillus-Inulin (CULTURELLE ADULT ULT BALANCE) CAPS, Take  1 capsule by mouth daily., Disp: , Rfl:    levocetirizine (XYZAL) 5 MG tablet, Take 1 tablet (5 mg total) by mouth every evening. (Patient taking differently: Take 5 mg by mouth daily.), Disp: 90 tablet, Rfl: 5   levothyroxine (SYNTHROID) 25 MCG tablet, Take 1 tablet (25 mcg total) by mouth daily., Disp: 90 tablet, Rfl: 2   loperamide (IMODIUM A-D) 2 MG tablet, Take by mouth., Disp: , Rfl:    loratadine (CLARITIN) 10 MG tablet, Take 1 tablet (10 mg total) by mouth daily as needed for allergies (Can take an extra dose during flare ups.)., Disp: 180 tablet, Rfl: 1   melatonin 5 MG TABS, Take 5 mg by mouth at bedtime., Disp: , Rfl:    methocarbamol (ROBAXIN) 500 MG tablet, Take 1 tablet (500 mg total) by mouth 2 (two) times daily as needed., Disp: 120 tablet, Rfl: 2   methocarbamol (ROBAXIN) 500 MG tablet, Take 1 tablet (500 mg total) by mouth 3 (three) times daily as needed., Disp: 270 tablet, Rfl: 2   montelukast (SINGULAIR) 10 MG tablet, Take 1 tablet (10 mg total) by mouth at bedtime., Disp: 90 tablet, Rfl: 1   nitroGLYCERIN (NITROSTAT) 0.4 MG SL tablet, Place 1 tablet (0.4 mg total) under the tongue every 5 (five) minutes as needed., Disp: 25 tablet, Rfl: 10   omega-3 acid ethyl esters (LOVAZA) 1 g capsule, Take 2 capsules (2 g total) by mouth 2 (two) times daily., Disp: 360 capsule, Rfl: 1   pantoprazole (PROTONIX) 40 MG tablet, Take 1 tablet (40 mg total) by mouth in the morning., Disp: 90 tablet, Rfl: 1   polyethylene glycol (MIRALAX / GLYCOLAX) 17 g packet, Take 17 g by mouth daily., Disp: , Rfl:    psyllium (METAMUCIL) 58.6 % packet, Take 1 packet by mouth daily., Disp: , Rfl:    valACYclovir (VALTREX) 500 MG tablet, Take 1 tablet (500 mg total) by mouth daily., Disp: 90 tablet, Rfl: 1   venlafaxine XR (EFFEXOR XR) 37.5 MG 24 hr capsule, Take 1 capsule (37.5 mg total) by mouth daily., Disp: 90 capsule, Rfl: 2   XEMBIFY 10 GM/50ML SOLN, Inject 10 g into the skin once a week. Wednesday, Disp: ,  Rfl:    Medications ordered in this encounter:  Meds ordered this encounter  Medications   nirmatrelvir/ritonavir (PAXLOVID) 20 x 150 MG & 10 x 100MG  TABS    Sig: Take 3 tablets by mouth 2 (two) times daily for 5 days. (Take nirmatrelvir 150 mg two tablets twice daily for 5 days and ritonavir 100 mg one tablet twice daily for 5 days) Patient GFR is greater than 60    Dispense:  30 tablet    Refill:  0    Order Specific  Question:   Supervising Provider    Answer:   Merrilee Jansky [[id number]]     *If you need refills on other medications prior to your next appointment, please contact your pharmacy*  Follow-Up: Call back or seek an in-person evaluation if the symptoms worsen or if the condition fails to improve as anticipated.  [facility] [phone]  Care Instructions: Can take to lessen severity: Vit C 500mg  twice daily Quercertin 250-500mg  twice daily Zinc 75-100mg  daily Melatonin 3-6 mg at bedtime Vit D3 1000-2000 IU daily Aspirin 81 mg daily with food Optional: Famotidine 20mg  daily Also can add tylenol/ibuprofen as needed for fevers and body aches May add Mucinex or Mucinex DM as needed for cough/congestion    Isolation Instructions: You are to isolate at home until you have been fever free for at least 24 hours without a fever-reducing medication, and symptoms have been steadily improving for 24 hours. At that time,  you can end isolation but need to mask for an additional 5 days.   If you must be around other household members who do not have symptoms, you need to make sure that both you and the family members are masking consistently with a high-quality mask.  If you note any worsening of symptoms despite treatment, please seek an in-person evaluation ASAP. If you note any significant shortness of breath or any chest pain, please seek ER evaluation. Please do not delay care!   COVID-19: What to Do if You Are Sick If you test positive and are an older  adult or someone who is at high risk of getting very sick from COVID-19, treatment may be available. Contact a healthcare provider right away after a positive test to determine if you are eligible, even if your symptoms are mild right now. You can also visit a Test to Treat location and, if eligible, receive a prescription from a provider. Don't delay: Treatment must be started within the first few days to be effective. If you have a fever, cough, or other symptoms, you might have COVID-19. Most people have mild illness and are able to recover at home. If you are sick: Keep track of your symptoms. If you have an emergency warning sign (including trouble breathing), call 911. Steps to help prevent the spread of COVID-19 if you are sick If you are sick with COVID-19 or think you might have COVID-19, follow the steps below to care for yourself and to help protect other people in your home and community. Stay home except to get medical care Stay home. Most people with COVID-19 have mild illness and can recover at home without medical care. Do not leave your home, except to get medical care. Do not visit public areas and do not go to places where you are unable to wear a mask. Take care of yourself. Get rest and stay hydrated. Take over-the-counter medicines, such as acetaminophen, to help you feel better. Stay in touch with your doctor. Call before you get medical care. Be sure to get care if you have trouble breathing, or have any other emergency warning signs, or if you think it is an emergency. Avoid public transportation, ride-sharing, or taxis if possible. Get tested If you have symptoms of COVID-19, get tested. While waiting for test results, stay away from others, including staying apart from those living in your household. Get tested as soon as possible after your symptoms start. Treatments may be available for people with COVID-19 who are at risk for becoming  very sick. Don't delay: Treatment must  be started early to be effective--some treatments must begin within 5 days of your first symptoms. Contact your healthcare provider right away if your test result is positive to determine if you are eligible. Self-tests are one of several options for testing for the virus that causes COVID-19 and may be more convenient than laboratory-based tests and point-of-care tests. Ask your healthcare provider or your local health department if you need help interpreting your test results. You can visit your state, tribal, local, and territorial health department's website to look for the latest local information on testing sites. Separate yourself from other people As much as possible, stay in a specific room and away from other people and pets in your home. If possible, you should use a separate bathroom. If you need to be around other people or animals in or outside of the home, wear a well-fitting mask. Tell your close contacts that they may have been exposed to COVID-19. An infected person can spread COVID-19 starting 48 hours (or 2 days) before the person has any symptoms or tests positive. By letting your close contacts know they may have been exposed to COVID-19, you are helping to protect everyone. See COVID-19 and Animals if you have questions about pets. If you are diagnosed with COVID-19, someone from the health department may call you. Answer the call to slow the spread. Monitor your symptoms Symptoms of COVID-19 include fever, cough, or other symptoms. Follow care instructions from your healthcare provider and local health department. Your local health authorities may give instructions on checking your symptoms and reporting information. When to seek emergency medical attention Look for emergency warning signs* for COVID-19. If someone is showing any of these signs, seek emergency medical care immediately: Trouble breathing Persistent pain or pressure in the chest New confusion Inability to wake  or stay awake Pale, gray, or blue-colored skin, lips, or nail beds, depending on skin tone *This list is not all possible symptoms. Please call your medical provider for any other symptoms that are severe or concerning to you. Call 911 or call ahead to your local emergency facility: Notify the operator that you are seeking care for someone who has or may have COVID-19. Call ahead before visiting your doctor Call ahead. Many medical visits for routine care are being postponed or done by phone or telemedicine. If you have a medical appointment that cannot be postponed, call your doctor's office, and tell them you have or may have COVID-19. This will help the office protect themselves and other patients. If you are sick, wear a well-fitting mask You should wear a mask if you must be around other people or animals, including pets (even at home). Wear a mask with the best fit, protection, and comfort for you. You don't need to wear the mask if you are alone. If you can't put on a mask (because of trouble breathing, for example), cover your coughs and sneezes in some other way. Try to stay at least 6 feet away from other people. This will help protect the people around you. Masks should not be placed on young children under age 79 years, anyone who has trouble breathing, or anyone who is not able to remove the mask without help. Cover your coughs and sneezes Cover your mouth and nose with a tissue when you cough or sneeze. Throw away used tissues in a lined trash can. Immediately wash your hands with soap and water for at least 20 seconds. If  soap and water are not available, clean your hands with an alcohol-based hand sanitizer that contains at least 60% alcohol. Clean your hands often Wash your hands often with soap and water for at least 20 seconds. This is especially important after blowing your nose, coughing, or sneezing; going to the bathroom; and before eating or preparing food. Use hand sanitizer  if soap and water are not available. Use an alcohol-based hand sanitizer with at least 60% alcohol, covering all surfaces of your hands and rubbing them together until they feel dry. Soap and water are the best option, especially if hands are visibly dirty. Avoid touching your eyes, nose, and mouth with unwashed hands. Handwashing Tips Avoid sharing personal household items Do not share dishes, drinking glasses, cups, eating utensils, towels, or bedding with other people in your home. Wash these items thoroughly after using them with soap and water or put in the dishwasher. Clean surfaces in your home regularly Clean and disinfect high-touch surfaces (for example, doorknobs, tables, handles, light switches, and countertops) in your "sick room" and bathroom. In shared spaces, you should clean and disinfect surfaces and items after each use by the person who is ill. If you are sick and cannot clean, a caregiver or other person should only clean and disinfect the area around you (such as your bedroom and bathroom) on an as needed basis. Your caregiver/other person should wait as long as possible (at least several hours) and wear a mask before entering, cleaning, and disinfecting shared spaces that you use. Clean and disinfect areas that may have blood, stool, or body fluids on them. Use household cleaners and disinfectants. Clean visible dirty surfaces with household cleaners containing soap or detergent. Then, use a household disinfectant. Use a product from [employer] List N: Disinfectants for Coronavirus (COVID-19). Be sure to follow the instructions on the label to ensure safe and effective use of the product. Many products recommend keeping the surface wet with a disinfectant for a certain period of time (look at "contact time" on the product label). You may also need to wear personal protective equipment, such as gloves, depending on the directions on the product label. Immediately after disinfecting,  wash your hands with soap and water for 20 seconds. For completed guidance on cleaning and disinfecting your home, visit Complete Disinfection Guidance. Take steps to improve ventilation at home Improve ventilation (air flow) at home to help prevent from spreading COVID-19 to other people in your household. Clear out COVID-19 virus particles in the air by opening windows, using air filters, and turning on fans in your home. Use this interactive tool to learn how to improve air flow in your home. When you can be around others after being sick with COVID-19 Deciding when you can be around others is different for different situations. Find out when you can safely end home isolation. For any additional questions about your care, contact your healthcare provider or state or local health department. 05/07/2020 Content source: The [facility] ([facility]), Division of Viral Diseases This information is not intended to replace advice given to you by your health care provider. Make sure you discuss any questions you have with your health care provider. Document Revised: 06/20/2020 Document Reviewed: 06/20/2020 Elsevier Patient Education  2022 [employer].     If you have been instructed to have an in-person evaluation today at a local Urgent Care facility, please use the link below. It will take you to a list of all of our  available [facility], including address, phone number and hours of operation. Please do not delay care.  [facility]  If you or a family member do not have a primary care provider, use the link below to schedule a visit and establish care. When you choose a [facility] primary care physician or advanced practice provider, you gain a long-term partner in health. Find a Primary Care Provider  Learn more about [facility]'s in-office and virtual care options: [facility] - Get Care Now

## 2022-10-19 NOTE — Progress Notes (Signed)
Virtual Visit Consent   Beverly [name], you are scheduled for a virtual visit with a [facility] provider today. Just as with appointments in the office, your consent must be obtained to participate. Your consent will be active for this visit and any virtual visit you may have with one of our providers in the next 365 days. If you have a MyChart account, a copy of this consent can be sent to you electronically.  As this is a virtual visit, video technology does not allow for your provider to perform a traditional examination. This may limit your provider's ability to fully assess your condition. If your provider identifies any concerns that need to be evaluated in person or the need to arrange testing (such as labs, EKG, etc.), we will make arrangements to do so. Although advances in technology are sophisticated, we cannot ensure that it will always work on either your end or our end. If the connection with a video visit is poor, the visit may have to be switched to a telephone visit. With either a video or telephone visit, we are not always able to ensure that we have a secure connection.  By engaging in this virtual visit, you consent to the provision of healthcare and authorize for your insurance to be billed (if applicable) for the services provided during this visit. Depending on your insurance coverage, you may receive a charge related to this service.  I need to obtain your verbal consent now. Are you willing to proceed with your visit today? Beverly [name] has provided verbal consent on 10/19/2022 for a virtual visit (video or telephone). Margaretann Loveless, PA-C  Date: 10/19/2022 4:41 PM  Virtual Visit via Video Note   I, Margaretann Loveless, connected with  Beverly [name]  ([id number], 1946/01/24) on 10/19/22 at  4:15 PM EDT by a video-enabled telemedicine application and verified that I am speaking with the correct person using two identifiers.  Location: Patient: Virtual Visit  Location Patient: Home Provider: Virtual Visit Location Provider: Home Office   I discussed the limitations of evaluation and management by telemedicine and the availability of in person appointments. The patient expressed understanding and agreed to proceed.    History of Present Illness: Beverly [name] is a 77 y.o. who identifies as a female who was assigned female at birth, and is being seen today for Covid 39.  HPI: URI  This is a new problem. The current episode started in the past 7 days (Tested positive for Covid 19 on Friday; had traveled out of the country prior. Had Covid 19 booster (moderna) 3 days before symptom onset. Did Video Visit Friday (10/16/22) and was told may be positive and symptomatic due to vaccine symptoms have persisted). The problem has been gradually worsening. There has been no fever. Associated symptoms include congestion, coughing, headaches, rhinorrhea and a sore throat. Pertinent negatives include no diarrhea, ear pain, nausea, plugged ear sensation or swollen glands. She has tried acetaminophen for the symptoms. The treatment provided no relief.     Problems:  Patient Active Problem List   Diagnosis Date Noted   S/P lumbar fusion 02/02/2022   ABLA (acute blood loss anemia) 11/05/2021   Personal history of gastric ulcer    GI bleed 11/04/2021   Tracheobronchomalacia 11/04/2021   Normocytic anemia 11/04/2021   Hyperglycemia 11/04/2021   Changing skin lesion 08/01/2021   CAD (coronary artery disease) 05/20/2021   Shortness of breath 05/19/2021   Abnormal cardiac CT angiography 05/19/2021  Degenerative spondylolisthesis 03/05/2021   Spinal stenosis of lumbar region 03/05/2021   Lumbar pain 03/05/2021   Common variable immunodeficiency (CVID) 11/29/2020   Bronchiectasis without complication (HCC) 11/29/2020   Lymphocytic colitis 11/29/2020   Pain in both lower extremities 11/29/2020   Hypothyroidism 11/22/2020   Vitamin D deficiency 11/21/2020    Allergies 08/28/2020   Hypogammaglobulinemia (HCC) 08/06/2020   Diarrhea 03/13/2020   Acid-fast bacteria present 02/20/2020   Carpal tunnel syndrome of left wrist 02/09/2020   Nocardia infection 01/11/2020   Medication monitoring encounter 01/11/2020   Bilateral hand pain 10/12/2019   OSA (obstructive sleep apnea) 07/31/2019   Chest pain 06/22/2019   Interstitial lung disease (HCC) 06/22/2019   S/P reverse total shoulder arthroplasty, right 04/11/2019   History of reverse total replacement of right shoulder joint 04/11/2019   Aortic atherosclerosis (HCC) 04/10/2019   Hardening of the aorta (main artery of the heart) (HCC) 04/10/2019   Brow ptosis 07/28/2018   Facial trauma 07/28/2018   Nasal fracture 07/28/2018   Ptosis of eyebrow 07/28/2018   Injury of face 07/28/2018   Fractured nasal bones 07/28/2018   Tear of left rotator cuff 02/22/2018   Abnormal chest sounds 10/16/2016   Moderate persistent asthma without complication 09/18/2016   Moderate persistent asthma 09/18/2016   Transient visual loss of both eyes 03/16/2016   Transient visual loss, bilateral 03/16/2016   Amaurosis fugax 01/09/2015   Postmenopausal symptoms 11/26/2014   Menopausal and postmenopausal disorder 11/26/2014   SIADH (syndrome of inappropriate ADH production) (HCC) 11/26/2014   Left shoulder pain 10/02/2014   Shoulder pain 10/02/2014   Right hip pain 09/05/2014   Pain in joint of right hip 09/05/2014   Unspecified asthma, with exacerbation 04/25/2012   Bronchitis, acute 04/25/2012   LPRD (laryngopharyngeal reflux disease) 04/06/2011   Asthmatic bronchitis 04/06/2011   Allergic rhinitis 04/06/2011   Mixed hyperlipidemia 04/06/2011   Chronic back pain 04/06/2011   Gastroesophageal reflux disease 04/06/2011   Cough 02/20/2011   Skin cancer 07/17/1977    Allergies:  Allergies  Allergen Reactions   Adhesive [Tape] Rash    Blister    Codeine Rash   Medications:  Current Outpatient Medications:     nirmatrelvir/ritonavir (PAXLOVID) 20 x 150 MG & 10 x 100MG  TABS, Take 3 tablets by mouth 2 (two) times daily for 5 days. (Take nirmatrelvir 150 mg two tablets twice daily for 5 days and ritonavir 100 mg one tablet twice daily for 5 days) Patient GFR is greater than 60, Disp: 30 tablet, Rfl: 0   acetaminophen (TYLENOL) 650 MG CR tablet, SMARTSIG:1 Tablet(s) By Mouth, Disp: , Rfl:    albuterol (VENTOLIN HFA) 108 (90 Base) MCG/ACT inhaler, Inhale 2 puffs into the lungs every 4 (four) hours as needed for wheezing or shortness of breath., Disp: 6.7 g, Rfl: 1   amoxicillin (AMOXIL) 500 MG tablet, , Disp: , Rfl:    ARTIFICIAL TEAR OP, Place 2 drops into both eyes in the morning and at bedtime., Disp: , Rfl:    atorvastatin (LIPITOR) 40 MG tablet, Take 1 tablet (40 mg total) by mouth daily., Disp: 90 tablet, Rfl: 1   azelastine (ASTELIN) 0.1 % nasal spray, Place 2 sprays into both nostrils 2 (two) times daily. Use in each nostril as directed (Patient not taking: Reported on 09/17/2022), Disp: 30 mL, Rfl: 5   budesonide (ENTOCORT EC) 3 MG 24 hr capsule, Take 1 capsule (3 mg total) by mouth daily., Disp: 90 capsule, Rfl: 5   Buprenorphine HCl (BELBUCA)  750 MCG FILM, Place 750 mcg inside cheek 2 (two) times daily. Stop ., Disp: 60 each, Rfl: 2   Buprenorphine HCl (BELBUCA) 750 MCG FILM, Place 1 film to inside of mouth (buccal) twice a day. **Stop belbuca 600 mcg, Disp: 60 each, Rfl: 2   Buprenorphine HCl (BELBUCA) 750 MCG FILM, Place 1 film (750 mcg) inside cheek 2 times daily., Disp: 60 each, Rfl: 2   Calcium Carbonate Antacid (TUMS PO), Take 1 tablet by mouth as needed (heartburn)., Disp: , Rfl:    Calcium Carbonate-Vitamin D 600-200 MG-UNIT TABS, Take 1 tablet by mouth 2 (two) times daily., Disp: , Rfl:    clopidogrel (PLAVIX) 75 MG tablet, Take 1 tablet (75 mg total) by mouth daily., Disp: 90 tablet, Rfl: 3   colestipol (COLESTID) 1 g tablet, Take 1 tablet (1 g total) by mouth daily., Disp: 90 tablet,  Rfl: 1   Cyanocobalamin (VITAMIN B-12 PO), Take 1 capsule by mouth daily. (Patient not taking: Reported on 09/17/2022), Disp: , Rfl:    Docusate Sodium 100 MG capsule, Take 100 mg by mouth in the morning and at bedtime., Disp: , Rfl:    EPINEPHrine 0.3 mg/0.3 mL IJ SOAJ injection, Inject 0.3 mg into the muscle as needed for anaphylaxis., Disp: , Rfl:    famotidine (PEPCID) 40 MG tablet, Take 1 tablet (40 mg total) by mouth at bedtime., Disp: 90 tablet, Rfl: 1   FIBER PO, Take 2 tablets by mouth in the morning and at bedtime., Disp: , Rfl:    fluorouracil (EFUDEX) 5 % cream, Apply two times a day to areas on left leg for 3 weeks., Disp: 40 g, Rfl: 0   gabapentin (NEURONTIN) 800 MG tablet, Take 1 tablet (800 mg total) by mouth at bedtime., Disp: 90 tablet, Rfl: 2   guaiFENesin (MUCINEX) 600 MG 12 hr tablet, Take 600 mg by mouth 2 (two) times daily., Disp: , Rfl:    HYDROcodone-acetaminophen (NORCO) 7.5-325 MG tablet, Take 1 tablet by mouth 4 (four) times daily as needed for pain, Disp: 120 tablet, Rfl: 0   ipratropium (ATROVENT) 0.06 % nasal spray, Place 2 sprays into both nostrils 4 (four) times daily as needed for rhinitis., Disp: 45 mL, Rfl: 1   Lactobacillus-Inulin (CULTURELLE ADULT ULT BALANCE) CAPS, Take 1 capsule by mouth daily., Disp: , Rfl:    levocetirizine (XYZAL) 5 MG tablet, Take 1 tablet (5 mg total) by mouth every evening. (Patient taking differently: Take 5 mg by mouth daily.), Disp: 90 tablet, Rfl: 5   levothyroxine (SYNTHROID) 25 MCG tablet, Take 1 tablet (25 mcg total) by mouth daily., Disp: 90 tablet, Rfl: 2   loperamide (IMODIUM A-D) 2 MG tablet, Take by mouth., Disp: , Rfl:    loratadine (CLARITIN) 10 MG tablet, Take 1 tablet (10 mg total) by mouth daily as needed for allergies (Can take an extra dose during flare ups.)., Disp: 180 tablet, Rfl: 1   melatonin 5 MG TABS, Take 5 mg by mouth at bedtime., Disp: , Rfl:    methocarbamol (ROBAXIN) 500 MG tablet, Take 1 tablet (500 mg  total) by mouth 2 (two) times daily as needed., Disp: 120 tablet, Rfl: 2   methocarbamol (ROBAXIN) 500 MG tablet, Take 1 tablet (500 mg total) by mouth 3 (three) times daily as needed., Disp: 270 tablet, Rfl: 2   montelukast (SINGULAIR) 10 MG tablet, Take 1 tablet (10 mg total) by mouth at bedtime., Disp: 90 tablet, Rfl: 1   nitroGLYCERIN (NITROSTAT) 0.4 MG SL tablet,  Place 1 tablet (0.4 mg total) under the tongue every 5 (five) minutes as needed., Disp: 25 tablet, Rfl: 10   omega-3 acid ethyl esters (LOVAZA) 1 g capsule, Take 2 capsules (2 g total) by mouth 2 (two) times daily., Disp: 360 capsule, Rfl: 1   pantoprazole (PROTONIX) 40 MG tablet, Take 1 tablet (40 mg total) by mouth in the morning., Disp: 90 tablet, Rfl: 1   polyethylene glycol (MIRALAX / GLYCOLAX) 17 g packet, Take 17 g by mouth daily., Disp: , Rfl:    psyllium (METAMUCIL) 58.6 % packet, Take 1 packet by mouth daily., Disp: , Rfl:    valACYclovir (VALTREX) 500 MG tablet, Take 1 tablet (500 mg total) by mouth daily., Disp: 90 tablet, Rfl: 1   venlafaxine XR (EFFEXOR XR) 37.5 MG 24 hr capsule, Take 1 capsule (37.5 mg total) by mouth daily. (Stop paxil), Disp: 30 capsule, Rfl: 2   venlafaxine XR (EFFEXOR XR) 37.5 MG 24 hr capsule, Take 1 capsule (37.5 mg total) by mouth daily., Disp: 90 capsule, Rfl: 2   XEMBIFY 10 GM/50ML SOLN, Inject 10 g into the skin once a week. Wednesday, Disp: , Rfl:   Observations/Objective: Patient is well-developed, well-nourished in no acute distress.  Resting comfortably at home.  Head is normocephalic, atraumatic.  No labored breathing.  Speech is clear and coherent with logical content.  Patient is alert and oriented at baseline.    Assessment and Plan: 1. COVID-19 - nirmatrelvir/ritonavir (PAXLOVID) 20 x 150 MG & 10 x 100MG  TABS; Take 3 tablets by mouth 2 (two) times daily for 5 days. (Take nirmatrelvir 150 mg two tablets twice daily for 5 days and ritonavir 100 mg one tablet twice daily for 5  days) Patient GFR is greater than 60  Dispense: 30 tablet; Refill: 0  - Continue OTC symptomatic management of choice - Will send OTC vitamins and supplement information through AVS - Paxlovid prescribed - Patient enrolled in MyChart symptom monitoring - Push fluids - Rest as needed - Discussed return precautions and when to seek in-person evaluation, sent via AVS as well   Follow Up Instructions: I discussed the assessment and treatment plan with the patient. The patient was provided an opportunity to ask questions and all were answered. The patient agreed with the plan and demonstrated an understanding of the instructions.  A copy of instructions were sent to the patient via MyChart unless otherwise noted below.    The patient was advised to call back or seek an in-person evaluation if the symptoms worsen or if the condition fails to improve as anticipated.  Time:  I spent 12 minutes with the patient via telehealth technology discussing the above problems/concerns.    Margaretann Loveless, PA-C

## 2022-10-20 ENCOUNTER — Encounter: Payer: Self-pay | Admitting: Internal Medicine

## 2022-10-20 ENCOUNTER — Other Ambulatory Visit (HOSPITAL_COMMUNITY): Payer: Self-pay

## 2022-10-20 ENCOUNTER — Telehealth: Payer: Self-pay | Admitting: *Deleted

## 2022-10-20 ENCOUNTER — Other Ambulatory Visit: Payer: Self-pay

## 2022-10-20 NOTE — Telephone Encounter (Signed)
Called and spoke with patient, she states that she used the virtual visits online and was prescribed Paxlovid and has been taking it.  She is starting to feel better.  She requested another sputum cup, placed one up front for her to pick up the end of next week when she is better.  Nothing further needed.

## 2022-10-20 NOTE — Telephone Encounter (Signed)
If still having symptoms okay to order paxlovid since she's in the window. I did see the sputum culture but unfortunately it was not a deep sputum sample to be enough to process.

## 2022-10-20 NOTE — Telephone Encounter (Signed)
Converted from [employer] message:  I brought in a sputum for culture yesterday morning. I have had a productive cough for a couple of days. The lab lady couldn't find the sputum culture order and said that she would call your staff for the order.  This morning (message sent by patient on 10/17/22)  I tested myself for Covid X2. Both were positive  I received the Moderna Covid vaccine this past Wednesday, August 28.  I'm going to quarantine until my symptoms go away.  Please advise about treatment.  I use the [facility]. Please tell them that someone will pick it up today.  Please tell them NOT to mail it. Thank you so very much.   Attachments  [id number]   [id number]   Dr. Celine Mans, I put in the order for the sputum culture, I did not see mention in your last note of any other sputum sample.  Please advise if you want any other sputum sample ordered.

## 2022-10-22 ENCOUNTER — Other Ambulatory Visit: Payer: Self-pay | Admitting: Oncology

## 2022-10-22 ENCOUNTER — Other Ambulatory Visit (HOSPITAL_COMMUNITY): Payer: Self-pay

## 2022-10-22 DIAGNOSIS — Z006 Encounter for examination for normal comparison and control in clinical research program: Secondary | ICD-10-CM

## 2022-10-23 ENCOUNTER — Other Ambulatory Visit (HOSPITAL_COMMUNITY)
Admission: RE | Admit: 2022-10-23 | Discharge: 2022-10-23 | Disposition: A | Discharge status: Home / Self Care | Payer: Medicare PPO | Source: Ambulatory Visit | Attending: Oncology | Admitting: Oncology

## 2022-10-23 DIAGNOSIS — Z4889 Encounter for other specified surgical aftercare: Secondary | ICD-10-CM | POA: Diagnosis not present

## 2022-10-23 DIAGNOSIS — Z006 Encounter for examination for normal comparison and control in clinical research program: Secondary | ICD-10-CM | POA: Insufficient documentation

## 2022-10-23 DIAGNOSIS — M5416 Radiculopathy, lumbar region: Secondary | ICD-10-CM | POA: Diagnosis not present

## 2022-10-28 DIAGNOSIS — M25562 Pain in left knee: Secondary | ICD-10-CM | POA: Diagnosis not present

## 2022-10-28 DIAGNOSIS — M25561 Pain in right knee: Secondary | ICD-10-CM | POA: Diagnosis not present

## 2022-10-29 ENCOUNTER — Telehealth: Payer: Self-pay | Admitting: Nurse Practitioner

## 2022-10-29 NOTE — Telephone Encounter (Signed)
Left message for patient to call back  

## 2022-10-29 NOTE — Telephone Encounter (Signed)
Received MyChart message from patient complaining of lower left quadrant abdominal tenderness. Scheduled patient with Gunnar Fusi 01/20/23 at 11:30 a.m.  Patient wants to know what she should do in the interim.  Please call patient and advise.  Thank you.

## 2022-11-02 ENCOUNTER — Other Ambulatory Visit: Payer: Self-pay

## 2022-11-02 NOTE — Telephone Encounter (Signed)
Called and spoke with patient. Pt reports that she has had LLQ constant tenderness since last Tuesday. 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 I asked pt if she was having any colitis symptoms and she states that she honestly didn't know what those symptoms were. Pt denies any fever, chills, abdominal pain elsewhere, or diarrhea. Pt has continued to take Budesonide 3 mg daily. Pt reports that her last BM was yesterday and it was " a lot". Pain did not improve after BM. Pt has an appt with her PCP tomorrow and is scheduled for a f/u with Gunnar Fusi in December. Pt seeking recommendations in the interim. Please advise, thanks.

## 2022-11-02 NOTE — Telephone Encounter (Signed)
MyChart message sent to patient with recommendations.

## 2022-11-03 ENCOUNTER — Encounter: Payer: Self-pay | Admitting: Internal Medicine

## 2022-11-03 ENCOUNTER — Ambulatory Visit: Payer: Medicare PPO | Admitting: Internal Medicine

## 2022-11-03 VITALS — BP 130/68 | HR 64 | Temp 98.2°F | Wt 150.4 lb

## 2022-11-03 DIAGNOSIS — R1032 Left lower quadrant pain: Secondary | ICD-10-CM | POA: Diagnosis not present

## 2022-11-03 NOTE — Progress Notes (Signed)
Established Patient Office Visit     CC/Reason for Visit: Left lower quadrant abdominal pain.  HPI: Beverly [name] is a 77 y.o. female who is coming in today for the above mentioned reasons.  She has a history of lymphocytic colitis.  Because she is on chronic narcotics she usually takes stool softeners and MiraLAX daily.  She returned from a 2-1/2-week vacation during which she did not take her bowel regimen.  She noticed that her stool has been harder than usual.  2 days ago she had abrupt onset of left lower quadrant abdominal pain.  No fever, no nausea, no vomiting.  She started taking her bowel regimen again and had a large bowel movement that has helped relieve her pain somewhat.  She also tested positive for COVID during this vacation.  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   right sholder   BCC (basal cell carcinoma of skin) 05/09/2014   mid lower back   BCC (basal cell carcinoma of skin) 05/03/2017   right low back   BCC (basal cell carcinoma of skin) 05/03/2017   left upper back   BCC (basal cell carcinoma of skin) 07/05/2018   left mid back   BCC (basal cell carcinoma of skin) 05/20/1992   upper back   BCC (basal cell carcinoma of skin) 07/29/1993   left sholder medial   BCC (basal cell carcinoma of skin) 07/29/1993   left sholder lateral   BCC (basal cell carcinoma of skin) 07/29/1993   right thigh   BCC (basal cell carcinoma of skin) 07/29/1993   right sholder   BCC (basal cell carcinoma of skin) 12/22/1994   right mid forearm   BCC (basal cell carcinoma of skin) 12/22/1994   right upper forearm   BCC (basal cell carcinoma of skin) 12/22/1994   lower right upper arm   BCC  (basal cell carcinoma of skin) 12/22/1994   right upper arm sholder   BCC (basal cell carcinoma of skin) 08/11/1995   left leg below knee   BCC (basal cell carcinoma of skin) 04/11/2002   mid back   BCC (basal cell carcinoma of skin) 12/10/2002   right center upper back   BCC (basal cell carcinoma of skin) 05/26/2005   right post sholder   BCC (basal cell carcinoma) 05/09/2014   left inner shin   BCC (basal cell carcinoma) 06/12/2014   left forearm   Bowen's disease 10/07/1994   right post knee, right inner forearm/wrist   Bowen's disease 08/11/1995   left sholder   Bronchiectasis (HCC)    Cataract    left   Chronic pain    Common variable immunodeficiency [facility])    sees dr Ellin Goodie 02-11-2021 epic   Coronary artery disease    Depression    hx of   Diverticulosis of colon 1998   mild   DJD (degenerative joint disease)    Duodenal ulcer    h/o yrs ago   GERD (gastroesophageal reflux disease)    History of SCC (squamous cell carcinoma) of skin    Dr. Jorja Loa   History of sinus bradycardia    HLD (hyperlipidemia)  hypertriglyceridemia   Hypertensive retinopathy of both eyes 11/18/2017   Hypothyroidism    IBS (irritable bowel syndrome)    Internal hemorrhoids 1998   Lymphocytic colitis    Mitral regurgitation    mild   Nocardiosis    relased by infection disease dec 2022   Osteoarthritis    feet,shoulder,neck,back,hips and hands.   Pneumonia 2015   PONV (postoperative nausea and vomiting)    Rotator cuff tear, right 02/2019   infraspinatus and supraspinatus, and dislocation of long head of bicep tendon (Dr. Charlann Boxer)   SCC (squamous cell carcinoma) 11/26/2014   left hand, right hand, right deltoid mnay areas   SCC (squamous cell carcinoma) 05/03/2017   left cheek   Sleep apnea    uses a mouth guard nightly   Squamous cell carcinoma in situ (SCCIS) 07/05/2018   left hand   Tracheobronchomalacia    Trigger finger    left hand ring finger   Vitamin D deficiency     mild    Past Surgical History:  Procedure Laterality Date   ABDOMINAL EXPOSURE N/A 02/02/2022   Procedure: ABDOMINAL EXPOSURE;  Surgeon: Cephus Shelling, MD;  Location: [facility] OR;  Service: Vascular;  Laterality: N/A;   ABDOMINAL HYSTERECTOMY  1998   complete   BIOPSY  11/05/2021   Procedure: BIOPSY;  Surgeon: Tressia Danas, MD;  Location: [facility];  Service: Gastroenterology;;   BLEPHAROPLASTY Bilateral 01/2018   BROW LIFT Bilateral 10/10/2018   Procedure: Coralee Rud LIFT;  Surgeon: Peggye Form, DO;  Location: [facility];  Service: Plastics;  Laterality: Bilateral;   BUNIONECTOMY     R 12/08, L 2004 (Dr. Wynelle Cleveland)   CARDIAC CATHETERIZATION     CARPAL TUNNEL RELEASE  1989   bilateral   CARPAL TUNNEL RELEASE Left 03/06/2021   Procedure: Left Revision Carpal Tunnel Release with hypothenar fat pad flap;  Surgeon: Gomez Cleverly, MD;  Location: [facility];  Service: Orthopedics;  Laterality: Left;  with local anesthesia   CATARACT EXTRACTION Bilateral Left in 08/2012, Right 04/2016   Dr.Hecker   CESAREAN SECTION     1981 and 1986   CHOLECYSTECTOMY  1992   laparoscopic   CLOSED REDUCTION NASAL FRACTURE N/A 08/29/2018   Procedure: CLOSED REDUCTION NASAL FRACTURE;  Surgeon: Peggye Form, DO;  Location: [facility];  Service: Plastics;  Laterality: N/A;  1 hour, please   COLONOSCOPY  2006   COLONOSCOPY  01/2012   due again 01/2021; mild diverticulosis   CORONARY STENT INTERVENTION N/A 05/19/2021   Procedure: CORONARY STENT INTERVENTION;  Surgeon: Yvonne Kendall, MD;  Location: [facility];  Service: Cardiovascular;  Laterality: N/A;   CORONARY ULTRASOUND/IVUS N/A 05/19/2021   Procedure: Intravascular Ultrasound/IVUS;  Surgeon: Yvonne Kendall, MD;  Location: [facility];  Service: Cardiovascular;  Laterality: N/A;   epidural steroid injection, back  02/2010   ESOPHAGOGASTRODUODENOSCOPY (EGD) WITH PROPOFOL  N/A 11/05/2021   Procedure: ESOPHAGOGASTRODUODENOSCOPY (EGD) WITH PROPOFOL;  Surgeon: Tressia Danas, MD;  Location: [facility];  Service: Gastroenterology;  Laterality: N/A;   HIP SURGERY     right bursectomy x 3   HIP SURGERY Right 2000   torn cartilage, repaired   INGUINAL HERNIA REPAIR  10/2007   bilat   LEFT HEART CATH AND CORONARY ANGIOGRAPHY N/A 05/19/2021   Procedure: LEFT HEART CATH AND CORONARY ANGIOGRAPHY;  Surgeon: Yvonne Kendall, MD;  Location: [facility];  Service: Cardiovascular;  Laterality: N/A;   LUMBAR FUSION  01/2022  NECK SURGERY  1989   c6-7 cervical laminectomy and diskecotmy   REVERSE SHOULDER ARTHROPLASTY Right 04/11/2019   Procedure: REVERSE SHOULDER ARTHROPLASTY;  Surgeon: Yolonda Kida, MD;  Location: [facility] OR;  Service: Orthopedics;  Laterality: Right;  Regional Block   SHOULDER SURGERY Left 03/2003   left rotator cuff repair   SHOULDER SURGERY Left 03/14/2018   rotator cuff repair; Dr. Shelle Iron   TONSILLECTOMY  age 65   TROCHANTERIC BURSA EXCISION Right 1978   TUBAL LIGATION  1986   UPPER GASTROINTESTINAL ENDOSCOPY     WISDOM TOOTH EXTRACTION     as teenager    Social History:  reports that she has never smoked. She has been exposed to tobacco smoke. She has never used smokeless tobacco. She reports current alcohol use of about 2.0 standard drinks of alcohol per week. She reports that she does not use drugs.  Allergies: Allergies  Allergen Reactions   Adhesive [Tape] Rash    Blister    Codeine Rash    Family History:  Family History  Problem Relation Age of Onset   Depression Mother    Schizophrenia Mother    Cancer Father        oral   Heart disease Father        bradycardia   Esophageal cancer Father    Cancer Sister        skin and lung   COPD Sister    Hypertension Sister    Osteoporosis Sister        compression fx's x 3 11/2018   Hyperthyroidism Brother    Cancer Brother        metastatic cancer to bone  (?primary)   Diabetes Maternal Grandmother    Cancer Paternal Grandmother 64       colon cancer, metastatic to liver   Colon cancer Paternal Grandmother    Liver cancer Paternal Grandmother    Hyperlipidemia Daughter    Asthma Son    ADD / ADHD Other      Current Outpatient Medications:    acetaminophen (TYLENOL) 650 MG CR tablet, SMARTSIG:1 Tablet(s) By Mouth, Disp: , Rfl:    albuterol (VENTOLIN HFA) 108 (90 Base) MCG/ACT inhaler, Inhale 2 puffs into the lungs every 4 (four) hours as needed for wheezing or shortness of breath., Disp: 6.7 g, Rfl: 1   amoxicillin (AMOXIL) 500 MG tablet, , Disp: , Rfl:    ARTIFICIAL TEAR OP, Place 2 drops into both eyes in the morning and at bedtime., Disp: , Rfl:    atorvastatin (LIPITOR) 40 MG tablet, Take 1 tablet (40 mg total) by mouth daily., Disp: 90 tablet, Rfl: 1   budesonide (ENTOCORT EC) 3 MG 24 hr capsule, Take 1 capsule (3 mg total) by mouth daily., Disp: 90 capsule, Rfl: 5   Buprenorphine HCl (BELBUCA) 750 MCG FILM, Place 1 film to inside of mouth (buccal) twice a day. **Stop belbuca 600 mcg, Disp: 60 each, Rfl: 2   Buprenorphine HCl (BELBUCA) 750 MCG FILM, Place 1 film (750 mcg) inside cheek 2 times daily., Disp: 60 each, Rfl: 2   Calcium Carbonate Antacid (TUMS PO), Take 1 tablet by mouth as needed (heartburn)., Disp: , Rfl:    Calcium Carbonate-Vitamin D 600-200 MG-UNIT TABS, Take 1 tablet by mouth 2 (two) times daily., Disp: , Rfl:    clopidogrel (PLAVIX) 75 MG tablet, Take 1 tablet (75 mg total) by mouth daily., Disp: 90 tablet, Rfl: 3   colestipol (COLESTID) 1 g tablet, Take 1 tablet (  1 g total) by mouth daily., Disp: 90 tablet, Rfl: 1   Cyanocobalamin (VITAMIN B-12 PO), Take 1 capsule by mouth daily., Disp: , Rfl:    Docusate Sodium 100 MG capsule, Take 100 mg by mouth in the morning and at bedtime., Disp: , Rfl:    EPINEPHrine 0.3 mg/0.3 mL IJ SOAJ injection, Inject 0.3 mg into the muscle as needed for anaphylaxis., Disp: , Rfl:     famotidine (PEPCID) 40 MG tablet, Take 1 tablet (40 mg total) by mouth at bedtime., Disp: 90 tablet, Rfl: 1   FIBER PO, Take 2 tablets by mouth in the morning and at bedtime., Disp: , Rfl:    fluorouracil (EFUDEX) 5 % cream, Apply two times a day to areas on left leg for 3 weeks., Disp: 40 g, Rfl: 0   gabapentin (NEURONTIN) 800 MG tablet, Take 1 tablet (800 mg total) by mouth at bedtime., Disp: 90 tablet, Rfl: 2   guaiFENesin (MUCINEX) 600 MG 12 hr tablet, Take 600 mg by mouth 2 (two) times daily., Disp: , Rfl:    HYDROcodone-acetaminophen (NORCO) 7.5-325 MG tablet, Take 1 tablet by mouth 4 (four) times daily as needed for pain, Disp: 120 tablet, Rfl: 0   ipratropium (ATROVENT) 0.06 % nasal spray, Place 2 sprays into both nostrils 4 (four) times daily as needed for rhinitis., Disp: 45 mL, Rfl: 1   Lactobacillus-Inulin (CULTURELLE ADULT ULT BALANCE) CAPS, Take 1 capsule by mouth daily., Disp: , Rfl:    levocetirizine (XYZAL) 5 MG tablet, Take 1 tablet (5 mg total) by mouth every evening. (Patient taking differently: Take 5 mg by mouth daily.), Disp: 90 tablet, Rfl: 5   levothyroxine (SYNTHROID) 25 MCG tablet, Take 1 tablet (25 mcg total) by mouth daily., Disp: 90 tablet, Rfl: 2   loratadine (CLARITIN) 10 MG tablet, Take 1 tablet (10 mg total) by mouth daily as needed for allergies (Can take an extra dose during flare ups.)., Disp: 180 tablet, Rfl: 1   melatonin 5 MG TABS, Take 5 mg by mouth at bedtime., Disp: , Rfl:    methocarbamol (ROBAXIN) 500 MG tablet, Take 1 tablet (500 mg total) by mouth 2 (two) times daily as needed., Disp: 120 tablet, Rfl: 2   montelukast (SINGULAIR) 10 MG tablet, Take 1 tablet (10 mg total) by mouth at bedtime., Disp: 90 tablet, Rfl: 1   nitroGLYCERIN (NITROSTAT) 0.4 MG SL tablet, Place 1 tablet (0.4 mg total) under the tongue every 5 (five) minutes as needed., Disp: 25 tablet, Rfl: 10   omega-3 acid ethyl esters (LOVAZA) 1 g capsule, Take 2 capsules (2 g total) by mouth 2  (two) times daily., Disp: 360 capsule, Rfl: 1   pantoprazole (PROTONIX) 40 MG tablet, Take 1 tablet (40 mg total) by mouth in the morning., Disp: 90 tablet, Rfl: 1   polyethylene glycol (MIRALAX / GLYCOLAX) 17 g packet, Take 17 g by mouth daily., Disp: , Rfl:    psyllium (METAMUCIL) 58.6 % packet, Take 1 packet by mouth daily., Disp: , Rfl:    valACYclovir (VALTREX) 500 MG tablet, Take 1 tablet (500 mg total) by mouth daily., Disp: 90 tablet, Rfl: 1   venlafaxine XR (EFFEXOR XR) 37.5 MG 24 hr capsule, Take 1 capsule (37.5 mg total) by mouth daily., Disp: 90 capsule, Rfl: 2   XEMBIFY 10 GM/50ML SOLN, Inject 10 g into the skin once a week. Wednesday, Disp: , Rfl:   Review of Systems:  Negative unless indicated in HPI.   Physical Exam: Vitals:  11/03/22 1110  BP: 130/68  Pulse: 64  Temp: 98.2 F (36.8 C)  TempSrc: Oral  SpO2: 98%  Weight: 150 lb 6.4 oz (68.2 kg)    Body mass index is 25.82 kg/m.   Physical Exam Abdominal:     General: Abdomen is flat. Bowel sounds are normal.     Palpations: Abdomen is soft.     Tenderness: There is abdominal tenderness in the left lower quadrant.      Impression and Plan:  Left lower quadrant abdominal pain  -As she is already improving, I have suggested observation for now.  She knows to reach out to [place] if symptoms persist.   Time spent:22 minutes reviewing chart, interviewing and examining patient and formulating plan of care.     Chaya Jan, MD [facility]

## 2022-11-05 ENCOUNTER — Telehealth: Payer: Self-pay | Admitting: *Deleted

## 2022-11-05 NOTE — Telephone Encounter (Signed)
GeneConnect Study  This nurse received a notification - TNP- test not performed for this participant.  Nurse attempted to reach patient by phone to notify her that her sample was not able to be processed, and she will need to provide another sample.  The nurse was also going to inform the pt that currently the study is undergoing an internal review and new samples are temporarily unable to be processed at this time.  A date to resume collecting new samples has not been determined.  The pt was unavailable by phone, and the nurse left a voice message asking her to return the nurse's call at her earliest convenience.    Janan Ridge RN, BSN, CCRP Clinical Research Nurse Lead 11/05/2022 12:01 PM

## 2022-11-05 NOTE — Telephone Encounter (Signed)
 GeneConnect  Confirmed I was speaking with Beverly [name] [id number] by using name and DOB. This nurse thanked her for participating in the [facility].  Informed participant the reason for this call is to follow-up on a recent blood sample the participant provided at one of the [facility] lab locations. Informed participant the test was not able to be completed with this sample and apologized for the inconvenience. Participant was requested to provide a new sample at one of our participating labs at no cost so that participant can continue participation and receive test results. She was informed that at the moment, the program is undergoing an internal review and is temporarily unable to process new samples.  The participant was told that an exact date to resume collecting new samples has not been determined.  The participant was told that an email will be sent to all participants when new collections resume.  The participant verbalized understanding.  She also informed the nurse regarding the details of her blood collection.  She expressed that additional information would have been helpful.  She is eager to support this program by volunteering her time.   Participant was thanked for their time and continued support of the above study.    Janan Ridge RN, BSN, CCRP Clinical Research Nurse Lead 11/05/2022 1:45 PM

## 2022-11-09 ENCOUNTER — Other Ambulatory Visit (HOSPITAL_BASED_OUTPATIENT_CLINIC_OR_DEPARTMENT_OTHER): Payer: Self-pay

## 2022-11-10 DIAGNOSIS — M25562 Pain in left knee: Secondary | ICD-10-CM | POA: Diagnosis not present

## 2022-11-10 DIAGNOSIS — M25561 Pain in right knee: Secondary | ICD-10-CM | POA: Diagnosis not present

## 2022-11-11 ENCOUNTER — Other Ambulatory Visit (HOSPITAL_COMMUNITY): Payer: Self-pay

## 2022-11-11 DIAGNOSIS — M25511 Pain in right shoulder: Secondary | ICD-10-CM | POA: Diagnosis not present

## 2022-11-11 MED ORDER — PREDNISONE 5 MG (21) PO TBPK
ORAL_TABLET | ORAL | 0 refills | Status: DC
  Filled 2022-11-11: qty 21, 6d supply, fill #0

## 2022-11-12 DIAGNOSIS — M5416 Radiculopathy, lumbar region: Secondary | ICD-10-CM | POA: Diagnosis not present

## 2022-11-13 ENCOUNTER — Other Ambulatory Visit (HOSPITAL_COMMUNITY): Payer: Self-pay

## 2022-11-16 ENCOUNTER — Ambulatory Visit: Payer: Medicare PPO | Admitting: Internal Medicine

## 2022-11-17 ENCOUNTER — Ambulatory Visit: Payer: Medicare PPO | Admitting: Internal Medicine

## 2022-11-17 DIAGNOSIS — M25562 Pain in left knee: Secondary | ICD-10-CM | POA: Diagnosis not present

## 2022-11-17 DIAGNOSIS — M25561 Pain in right knee: Secondary | ICD-10-CM | POA: Diagnosis not present

## 2022-11-18 DIAGNOSIS — Z79891 Long term (current) use of opiate analgesic: Secondary | ICD-10-CM | POA: Diagnosis not present

## 2022-11-18 DIAGNOSIS — M47816 Spondylosis without myelopathy or radiculopathy, lumbar region: Secondary | ICD-10-CM | POA: Diagnosis not present

## 2022-11-18 DIAGNOSIS — G894 Chronic pain syndrome: Secondary | ICD-10-CM | POA: Diagnosis not present

## 2022-11-18 DIAGNOSIS — M150 Primary generalized (osteo)arthritis: Secondary | ICD-10-CM | POA: Diagnosis not present

## 2022-11-19 ENCOUNTER — Other Ambulatory Visit: Payer: Self-pay

## 2022-11-19 ENCOUNTER — Ambulatory Visit: Payer: Medicare PPO | Admitting: Internal Medicine

## 2022-11-19 ENCOUNTER — Other Ambulatory Visit: Payer: Self-pay | Admitting: *Deleted

## 2022-11-19 ENCOUNTER — Other Ambulatory Visit (HOSPITAL_COMMUNITY): Payer: Self-pay

## 2022-11-19 DIAGNOSIS — M25562 Pain in left knee: Secondary | ICD-10-CM | POA: Diagnosis not present

## 2022-11-19 DIAGNOSIS — M25561 Pain in right knee: Secondary | ICD-10-CM | POA: Diagnosis not present

## 2022-11-19 MED ORDER — VALACYCLOVIR HCL 500 MG PO TABS
500.0000 mg | ORAL_TABLET | Freq: Every day | ORAL | 1 refills | Status: DC
  Filled 2022-11-19 – 2022-12-03 (×2): qty 90, 90d supply, fill #0
  Filled 2023-03-05: qty 90, 90d supply, fill #1
  Filled ????-??-??: fill #0

## 2022-11-19 MED ORDER — VALACYCLOVIR HCL 1 G PO TABS
1000.0000 mg | ORAL_TABLET | Freq: Two times a day (BID) | ORAL | 0 refills | Status: AC
  Filled 2022-11-19 (×2): qty 28, 14d supply, fill #0

## 2022-11-19 NOTE — Addendum Note (Signed)
Addended by: Kern Reap B on: 11/19/2022 01:12 PM   Modules accepted: Orders

## 2022-11-20 ENCOUNTER — Other Ambulatory Visit: Payer: Self-pay

## 2022-11-20 ENCOUNTER — Other Ambulatory Visit (HOSPITAL_COMMUNITY): Payer: Self-pay

## 2022-11-20 ENCOUNTER — Other Ambulatory Visit (HOSPITAL_BASED_OUTPATIENT_CLINIC_OR_DEPARTMENT_OTHER): Payer: Self-pay

## 2022-11-20 MED ORDER — DICLOFENAC SODIUM 1 % EX GEL
2.0000 g | Freq: Four times a day (QID) | CUTANEOUS | 2 refills | Status: DC | PRN
  Filled 2022-11-20: qty 200, 15d supply, fill #0
  Filled 2022-12-03: qty 200, 25d supply, fill #0
  Filled 2023-01-07: qty 200, 25d supply, fill #1
  Filled 2023-02-13: qty 200, 25d supply, fill #2
  Filled 2023-04-24: qty 100, 13d supply, fill #3

## 2022-11-20 MED ORDER — HYDROCODONE-ACETAMINOPHEN 7.5-325 MG PO TABS
1.0000 | ORAL_TABLET | Freq: Four times a day (QID) | ORAL | 0 refills | Status: DC | PRN
  Filled 2022-11-20 – 2022-12-03 (×2): qty 120, 30d supply, fill #0

## 2022-11-20 MED ORDER — VENLAFAXINE HCL ER 75 MG PO CP24
75.0000 mg | ORAL_CAPSULE | Freq: Every day | ORAL | 2 refills | Status: DC
  Filled 2022-11-20 – 2022-12-03 (×2): qty 90, 90d supply, fill #0
  Filled 2023-02-27: qty 90, 90d supply, fill #1
  Filled 2023-06-05: qty 90, 90d supply, fill #2

## 2022-11-20 MED ORDER — BELBUCA 750 MCG BU FILM
750.0000 ug | ORAL_FILM | Freq: Two times a day (BID) | BUCCAL | 2 refills | Status: DC
  Filled 2022-12-13: qty 60, 30d supply, fill #0
  Filled 2023-01-11: qty 60, 30d supply, fill #1
  Filled 2023-02-13: qty 60, 30d supply, fill #2

## 2022-11-21 ENCOUNTER — Other Ambulatory Visit (HOSPITAL_COMMUNITY): Payer: Self-pay

## 2022-11-26 ENCOUNTER — Other Ambulatory Visit (HOSPITAL_COMMUNITY): Payer: Self-pay

## 2022-11-27 ENCOUNTER — Other Ambulatory Visit (HOSPITAL_COMMUNITY): Payer: Self-pay

## 2022-12-03 ENCOUNTER — Other Ambulatory Visit: Payer: Self-pay | Admitting: Allergy and Immunology

## 2022-12-03 ENCOUNTER — Other Ambulatory Visit (HOSPITAL_COMMUNITY): Payer: Self-pay

## 2022-12-03 ENCOUNTER — Other Ambulatory Visit: Payer: Self-pay

## 2022-12-04 DIAGNOSIS — M4310 Spondylolisthesis, site unspecified: Secondary | ICD-10-CM | POA: Diagnosis not present

## 2022-12-04 DIAGNOSIS — Z4889 Encounter for other specified surgical aftercare: Secondary | ICD-10-CM | POA: Diagnosis not present

## 2022-12-04 DIAGNOSIS — M47896 Other spondylosis, lumbar region: Secondary | ICD-10-CM | POA: Diagnosis not present

## 2022-12-04 DIAGNOSIS — M5459 Other low back pain: Secondary | ICD-10-CM | POA: Diagnosis not present

## 2022-12-04 DIAGNOSIS — G8929 Other chronic pain: Secondary | ICD-10-CM | POA: Diagnosis not present

## 2022-12-07 ENCOUNTER — Other Ambulatory Visit: Payer: Self-pay | Admitting: Medical Genetics

## 2022-12-07 DIAGNOSIS — Z006 Encounter for examination for normal comparison and control in clinical research program: Secondary | ICD-10-CM

## 2022-12-07 NOTE — Progress Notes (Signed)
Additional lab as first was TNP

## 2022-12-08 ENCOUNTER — Other Ambulatory Visit: Payer: Self-pay | Admitting: Allergy and Immunology

## 2022-12-08 DIAGNOSIS — H04123 Dry eye syndrome of bilateral lacrimal glands: Secondary | ICD-10-CM | POA: Diagnosis not present

## 2022-12-08 DIAGNOSIS — G510 Bell's palsy: Secondary | ICD-10-CM | POA: Diagnosis not present

## 2022-12-08 DIAGNOSIS — H26493 Other secondary cataract, bilateral: Secondary | ICD-10-CM | POA: Diagnosis not present

## 2022-12-08 DIAGNOSIS — H02831 Dermatochalasis of right upper eyelid: Secondary | ICD-10-CM | POA: Diagnosis not present

## 2022-12-08 DIAGNOSIS — H524 Presbyopia: Secondary | ICD-10-CM | POA: Diagnosis not present

## 2022-12-09 ENCOUNTER — Other Ambulatory Visit (HOSPITAL_COMMUNITY): Payer: Self-pay

## 2022-12-09 DIAGNOSIS — Z79891 Long term (current) use of opiate analgesic: Secondary | ICD-10-CM | POA: Diagnosis not present

## 2022-12-09 DIAGNOSIS — M150 Primary generalized (osteo)arthritis: Secondary | ICD-10-CM | POA: Diagnosis not present

## 2022-12-09 DIAGNOSIS — M47816 Spondylosis without myelopathy or radiculopathy, lumbar region: Secondary | ICD-10-CM | POA: Diagnosis not present

## 2022-12-09 DIAGNOSIS — G894 Chronic pain syndrome: Secondary | ICD-10-CM | POA: Diagnosis not present

## 2022-12-09 MED ORDER — PREGABALIN 50 MG PO CAPS
50.0000 mg | ORAL_CAPSULE | Freq: Three times a day (TID) | ORAL | 1 refills | Status: DC
  Filled 2022-12-09 (×2): qty 90, 30d supply, fill #0
  Filled 2023-01-07: qty 90, 30d supply, fill #1

## 2022-12-10 ENCOUNTER — Other Ambulatory Visit: Payer: Medicare PPO

## 2022-12-10 ENCOUNTER — Other Ambulatory Visit: Payer: Self-pay | Admitting: *Deleted

## 2022-12-10 ENCOUNTER — Ambulatory Visit (INDEPENDENT_AMBULATORY_CARE_PROVIDER_SITE_OTHER)
Admission: RE | Admit: 2022-12-10 | Discharge: 2022-12-10 | Disposition: A | Discharge status: Home / Self Care | Payer: Medicare PPO | Source: Ambulatory Visit | Attending: Internal Medicine | Admitting: Internal Medicine

## 2022-12-10 DIAGNOSIS — J479 Bronchiectasis, uncomplicated: Secondary | ICD-10-CM

## 2022-12-10 DIAGNOSIS — Z96611 Presence of right artificial shoulder joint: Secondary | ICD-10-CM | POA: Diagnosis not present

## 2022-12-10 DIAGNOSIS — Z13820 Encounter for screening for osteoporosis: Secondary | ICD-10-CM | POA: Diagnosis not present

## 2022-12-11 ENCOUNTER — Encounter: Payer: Self-pay | Admitting: Internal Medicine

## 2022-12-11 DIAGNOSIS — J479 Bronchiectasis, uncomplicated: Secondary | ICD-10-CM

## 2022-12-11 LAB — RESPIRATORY CULTURE OR RESPIRATORY AND SPUTUM CULTURE: MICRO NUMBER:: 15638447

## 2022-12-14 ENCOUNTER — Other Ambulatory Visit (HOSPITAL_BASED_OUTPATIENT_CLINIC_OR_DEPARTMENT_OTHER): Payer: Self-pay

## 2022-12-14 ENCOUNTER — Other Ambulatory Visit (HOSPITAL_COMMUNITY): Payer: Self-pay

## 2022-12-14 ENCOUNTER — Other Ambulatory Visit: Payer: Self-pay

## 2022-12-15 ENCOUNTER — Encounter: Payer: Self-pay | Admitting: Internal Medicine

## 2022-12-16 DIAGNOSIS — M25562 Pain in left knee: Secondary | ICD-10-CM | POA: Diagnosis not present

## 2022-12-16 DIAGNOSIS — M25561 Pain in right knee: Secondary | ICD-10-CM | POA: Diagnosis not present

## 2022-12-18 ENCOUNTER — Other Ambulatory Visit: Payer: Self-pay | Admitting: Allergy and Immunology

## 2022-12-18 NOTE — Telephone Encounter (Signed)
Is this appropriate to refill for the patient?  

## 2022-12-21 ENCOUNTER — Other Ambulatory Visit (HOSPITAL_COMMUNITY)
Admission: RE | Admit: 2022-12-21 | Discharge: 2022-12-21 | Disposition: A | Discharge status: Home / Self Care | Payer: Self-pay | Source: Ambulatory Visit | Attending: Oncology | Admitting: Oncology

## 2022-12-21 DIAGNOSIS — Z006 Encounter for examination for normal comparison and control in clinical research program: Secondary | ICD-10-CM | POA: Insufficient documentation

## 2022-12-21 MED ORDER — COLESTIPOL HCL 1 G PO TABS
1.0000 g | ORAL_TABLET | Freq: Every day | ORAL | 1 refills | Status: DC
  Filled 2022-12-21: qty 90, 90d supply, fill #0
  Filled 2023-01-07: qty 90, 90d supply, fill #1

## 2022-12-22 ENCOUNTER — Other Ambulatory Visit (HOSPITAL_BASED_OUTPATIENT_CLINIC_OR_DEPARTMENT_OTHER): Payer: Self-pay

## 2022-12-22 ENCOUNTER — Other Ambulatory Visit: Payer: Self-pay

## 2022-12-22 ENCOUNTER — Other Ambulatory Visit (HOSPITAL_COMMUNITY): Payer: Self-pay

## 2022-12-23 DIAGNOSIS — M25561 Pain in right knee: Secondary | ICD-10-CM | POA: Diagnosis not present

## 2022-12-23 DIAGNOSIS — M25562 Pain in left knee: Secondary | ICD-10-CM | POA: Diagnosis not present

## 2023-01-07 ENCOUNTER — Other Ambulatory Visit: Payer: Self-pay | Admitting: Physician Assistant

## 2023-01-07 ENCOUNTER — Other Ambulatory Visit: Payer: Self-pay

## 2023-01-07 ENCOUNTER — Other Ambulatory Visit (HOSPITAL_COMMUNITY): Payer: Self-pay

## 2023-01-07 ENCOUNTER — Other Ambulatory Visit: Payer: Self-pay | Admitting: Internal Medicine

## 2023-01-07 DIAGNOSIS — E782 Mixed hyperlipidemia: Secondary | ICD-10-CM

## 2023-01-07 MED ORDER — DOCUSATE SODIUM 100 MG PO CAPS
100.0000 mg | ORAL_CAPSULE | Freq: Two times a day (BID) | ORAL | 11 refills | Status: DC
  Filled 2023-01-07: qty 30, 15d supply, fill #0

## 2023-01-07 MED ORDER — OMEGA-3-ACID ETHYL ESTERS 1 G PO CAPS
2.0000 g | ORAL_CAPSULE | Freq: Two times a day (BID) | ORAL | 0 refills | Status: DC
  Filled 2023-01-07: qty 360, 90d supply, fill #0

## 2023-01-07 MED ORDER — ATORVASTATIN CALCIUM 40 MG PO TABS
40.0000 mg | ORAL_TABLET | Freq: Every day | ORAL | 0 refills | Status: DC
  Filled 2023-01-07: qty 90, 90d supply, fill #0

## 2023-01-08 ENCOUNTER — Other Ambulatory Visit (HOSPITAL_COMMUNITY): Payer: Self-pay

## 2023-01-08 MED ORDER — CLOPIDOGREL BISULFATE 75 MG PO TABS
75.0000 mg | ORAL_TABLET | Freq: Every day | ORAL | 0 refills | Status: DC
  Filled 2023-01-08 – 2023-02-13 (×2): qty 30, 30d supply, fill #0

## 2023-01-11 ENCOUNTER — Other Ambulatory Visit (HOSPITAL_COMMUNITY): Payer: Self-pay

## 2023-01-12 ENCOUNTER — Other Ambulatory Visit (HOSPITAL_COMMUNITY): Payer: Self-pay

## 2023-01-12 DIAGNOSIS — Z79891 Long term (current) use of opiate analgesic: Secondary | ICD-10-CM | POA: Diagnosis not present

## 2023-01-12 DIAGNOSIS — M47816 Spondylosis without myelopathy or radiculopathy, lumbar region: Secondary | ICD-10-CM | POA: Diagnosis not present

## 2023-01-12 DIAGNOSIS — G894 Chronic pain syndrome: Secondary | ICD-10-CM | POA: Diagnosis not present

## 2023-01-12 DIAGNOSIS — M150 Primary generalized (osteo)arthritis: Secondary | ICD-10-CM | POA: Diagnosis not present

## 2023-01-12 MED ORDER — PREGABALIN 50 MG PO CAPS
50.0000 mg | ORAL_CAPSULE | Freq: Two times a day (BID) | ORAL | 2 refills | Status: DC
  Filled 2023-01-19 – 2023-02-13 (×3): qty 60, 30d supply, fill #0

## 2023-01-15 LAB — GENECONNECT MOLECULAR SCREEN

## 2023-01-18 NOTE — Progress Notes (Signed)
Cardiology Office Note:  .   Date:  01/26/2023  ID:  Beverly [name], DOB 11/17/1945, MRN [id number] PCP: [name], Limmie Patricia, MD  [facility] Providers Cardiologist:  Charlton Haws, MD    History of Present Illness: .   Beverly [name] is a 77 y.o. female  with history of coronary calcification on CT 06/2019,SVT/nocturnal bradycardia, chest pain with normal NST 06/2019, HLD, OSA, ILD with severe tracheobronchomalacia followed by pulmonary. I saw the patient for preop evaluation for potential lumbar surgery on 05/28/21 Dr. Shon Baton with abdominal exposure for OLIF at L3-L5 with Dr. Chestine Spore.   I ordered Coronary CTA and this was suggestive of tight LAD and ramus. Surgery was canceled and she had cardiac cath 05/19/21 with DES LAD and DES ramus. Plan for Brilinta/ASA for at least 6 months. Counseled on taking celebrex but she can't stop b/c of pain. Low dose losartan added.   She was admitted with acute GI bleed 10/21/21 and brilinta held. She was switched to plavix..  She underwent back surgery 01/2022.   Patient here for regular f/u and preop clearance. Denies chest pain, dyspnea, palpitations, edema. No regular exercise with her knee issues. She walks when goes on vacation. 3 international trips this year.   ROS:    Studies Reviewed: Marland Kitchen         Prior CV Studies:   Cath: 05/19/21   Conclusions: Severe two-vessel coronary artery disease with up to 90% stenoses involving the ostial through mid LAD and ostial through proximal ramus intermedius.  Mild, nonobstructive CAD noted in the LCx and RCA. Normal left ventricular systolic function (LVEF 55-65%) with normal filling pressure (LVEDP 10 mmHg) Successful IVUS-guided PCI to ostial through mid LAD using Onyx Frontier 2.75 x 38 mm drug-eluting stent with 0% residual stenosis and TIMI-3 flow Successful PCI to ostial through proximal ramus intermedius using Onyx Frontier 2.0 x 22 mm drug-eluting stent with 0% residual stenosis and  TIMI-3 flow.   Recommendations: Overnight extended recovery. Dual antiplatelet therapy with aspirin and clopidogrel, ideally for 6 months.  Nonemergent surgery will likely need to be delayed to allow for adequate antiplatelet therapy following two-vessel PCI. Aggressive secondary prevention.   Yvonne Kendall, MD [facility]   Diagnostic Dominance: Right Intervention         Echo from 06/23/19:    1. Left ventricular ejection fraction, by estimation, is 55 to 60%. The  left ventricle has normal function. The left ventricle has no regional  wall motion abnormalities. There is mild left ventricular hypertrophy.  Left ventricular diastolic function  could not be evaluated.   2. Right ventricular systolic function is normal. The right ventricular  size is normal.   3. The mitral valve is grossly normal. Trivial mitral valve  regurgitation. No evidence of mitral stenosis.   4. The aortic valve has an indeterminant number of cusps. Aortic valve  regurgitation is not visualized. Mild aortic valve sclerosis is present,  with no evidence of aortic valve stenosis.     Risk Assessment/Calculations:             Physical Exam:   VS:  BP 136/76   Pulse 68   Ht 5\' 4"  (1.626 m)   Wt 154 lb 9.6 oz (70.1 kg)   SpO2 98%   BMI 26.54 kg/m    Wt Readings from Last 3 Encounters:  01/26/23 154 lb 9.6 oz (70.1 kg)  01/20/23 154 lb 6.4 oz (70 kg)  11/03/22 150 lb 6.4 oz (  68.2 kg)    GEN: Well nourished, well developed in no acute distress NECK: No JVD; No carotid bruits CARDIAC:  RRR, no murmurs, rubs, gallops RESPIRATORY:  Clear to auscultation without rales, wheezing or rhonchi  ABDOMEN: Soft, non-tender, non-distended EXTREMITIES:  No edema; No deformity   ASSESSMENT AND PLAN: .    Preop clearance for Right TKA by Dr. Malon Kindle. Patient doing well without cardiac complaints. She's at reasonable risk for surgery without further cardiac testing. Can hold Plavix for 5 days if  needed prior to surgery.  According to the Revised Cardiac Risk Index (RCRI), her Perioperative Risk of Major Cardiac Event is (%): 0.9  Her Functional Capacity in METs is: 5.62 according to the Duke Activity Status Index (DASI).   CAD cardiac cath 05/19/21 with DES LAD and DES ramus. Plan for Plavix alone after GI bleed.    GI bleed now on Plavix, no ASA -no further bleeding problems   ILD-with chronic DOE   SVT/nocturnal bradycardia-no recent symptoms   HLD  lipitor- check labs today(had small breakfast)   OSA with mouth guard.         Dispo: f/u   Signed, Jacolyn Reedy, PA-C

## 2023-01-19 ENCOUNTER — Other Ambulatory Visit: Payer: Self-pay | Admitting: Medical Genetics

## 2023-01-19 ENCOUNTER — Other Ambulatory Visit (HOSPITAL_COMMUNITY): Payer: Self-pay

## 2023-01-19 ENCOUNTER — Other Ambulatory Visit: Payer: Self-pay

## 2023-01-19 DIAGNOSIS — Z006 Encounter for examination for normal comparison and control in clinical research program: Secondary | ICD-10-CM | POA: Insufficient documentation

## 2023-01-19 DIAGNOSIS — M25562 Pain in left knee: Secondary | ICD-10-CM | POA: Diagnosis not present

## 2023-01-19 DIAGNOSIS — M1711 Unilateral primary osteoarthritis, right knee: Secondary | ICD-10-CM | POA: Diagnosis not present

## 2023-01-19 NOTE — Progress Notes (Signed)
Initial sample was a TNP. New order requested. Confirmed consent on file.

## 2023-01-20 ENCOUNTER — Telehealth: Payer: Self-pay

## 2023-01-20 ENCOUNTER — Ambulatory Visit: Payer: Medicare PPO | Admitting: Nurse Practitioner

## 2023-01-20 ENCOUNTER — Encounter: Payer: Self-pay | Admitting: Nurse Practitioner

## 2023-01-20 ENCOUNTER — Other Ambulatory Visit: Payer: Self-pay

## 2023-01-20 VITALS — BP 138/60 | HR 64 | Ht 64.0 in | Wt 154.4 lb

## 2023-01-20 DIAGNOSIS — M47896 Other spondylosis, lumbar region: Secondary | ICD-10-CM | POA: Diagnosis not present

## 2023-01-20 DIAGNOSIS — K5903 Drug induced constipation: Secondary | ICD-10-CM | POA: Diagnosis not present

## 2023-01-20 DIAGNOSIS — R1030 Lower abdominal pain, unspecified: Secondary | ICD-10-CM

## 2023-01-20 DIAGNOSIS — K52832 Lymphocytic colitis: Secondary | ICD-10-CM

## 2023-01-20 DIAGNOSIS — R109 Unspecified abdominal pain: Secondary | ICD-10-CM

## 2023-01-20 DIAGNOSIS — Z4889 Encounter for other specified surgical aftercare: Secondary | ICD-10-CM | POA: Diagnosis not present

## 2023-01-20 DIAGNOSIS — G8929 Other chronic pain: Secondary | ICD-10-CM | POA: Diagnosis not present

## 2023-01-20 DIAGNOSIS — T402X5A Adverse effect of other opioids, initial encounter: Secondary | ICD-10-CM

## 2023-01-20 DIAGNOSIS — M5459 Other low back pain: Secondary | ICD-10-CM | POA: Diagnosis not present

## 2023-01-20 DIAGNOSIS — K2100 Gastro-esophageal reflux disease with esophagitis, without bleeding: Secondary | ICD-10-CM | POA: Diagnosis not present

## 2023-01-20 DIAGNOSIS — M4310 Spondylolisthesis, site unspecified: Secondary | ICD-10-CM | POA: Diagnosis not present

## 2023-01-20 MED ORDER — DICYCLOMINE HCL 10 MG PO CAPS
10.0000 mg | ORAL_CAPSULE | Freq: Two times a day (BID) | ORAL | 3 refills | Status: AC | PRN
  Filled 2023-01-20 – 2023-01-21 (×2): qty 60, 30d supply, fill #0
  Filled 2023-01-28 – 2023-05-04 (×2): qty 60, 30d supply, fill #1

## 2023-01-20 NOTE — Patient Instructions (Addendum)
Okay to stop Colestopol as far as we are concerned ( apparently started by Immunologist for diarrhea). Please check with Immunologist to make sure there is no other reason that it would have been prescribed. Apparently, not taking for high cholesterol  ( takes Lovaza for that)  Continue Budesonide 3 mg daily for lymphocytic colitis  Continue Protonix in am before breakfast and Famotidine 40 mg at bedtime   We have sent the following medications to your pharmacy for you to pick up at your convenience: Trial of Dicyclomine 10 mg 1 to 2 times daily as needed for abdominal cramping.   Follow-up 1 year. Office to contact you to schedule.   _______________________________________________________  If your blood pressure at your visit was 140/90 or greater, please contact your primary care physician to follow up on this.  _______________________________________________________  If you are age 75 or older, your body mass index should be between 23-30. Your Body mass index is 26.5 kg/m. If this is out of the aforementioned range listed, please consider follow up with your Primary Care Provider.  If you are age 57 or younger, your body mass index should be between 19-25. Your Body mass index is 26.5 kg/m. If this is out of the aformentioned range listed, please consider follow up with your Primary Care Provider.   ________________________________________________________  The [facility] providers would like to encourage you to use [facility] to communicate with providers for non-urgent requests or questions.  Due to long hold times on the telephone, sending your provider a message by [facility] may be a faster and more efficient way to get a response.  Please allow 48 business hours for a response.  Please remember that this is for non-urgent requests.  _______________________________________________________  It was a pleasure to see you today!  Thank you for trusting me with your gastrointestinal care!

## 2023-01-20 NOTE — Telephone Encounter (Signed)
Pt has appt 01/26/23 with Jacolyn Reedy, PAC.

## 2023-01-20 NOTE — Telephone Encounter (Signed)
Preoperative team, patient has upcoming cardiology appointment on 01/26/2023.  Please add preoperative cardiac evaluation to appointment notes.  I will defer cardiac evaluation and recommendations to upcoming appointment.  Thank you for your help.  Thomasene Ripple. Takeyla Million NP-C     01/20/2023, 1:11 PM [facility] [address] Office [phone] Fax [phone]

## 2023-01-20 NOTE — Telephone Encounter (Signed)
....     Pre-operative Risk Assessment    Patient Name: Beverly [name]  DOB: 03-11-1945 MRN: [id number]      Request for Surgical Clearance    Procedure:   RIGHT TOTAL KNEE ARTHROPLASTY  Date of Surgery:  Clearance TBD                                 Surgeon:  DR Malon Kindle Surgeon's Group or Practice Name:  [facility] Phone number:  [phone] Fax number:  [phone]   Type of Clearance Requested:   - Medical  - Pharmacy:  Hold Clopidogrel (Plavix)     Type of Anesthesia:  General AND/OR  SPINAL   Additional requests/questions:   LAST O/V 05/26/22, NEXT APPT12/10/24  Signed, Renee Ramus   01/20/2023, 12:02 PM

## 2023-01-20 NOTE — Progress Notes (Signed)
ASSESSMENT    Brief Narrative:  77 y.o.  female known to Dr. Barron Alvine with a past medical history not limited to immunoglobulin deficiency (gets IVIG infusions),  OSA,  CAD with stent x 2 in April 2023, tracheobronchomalacia,  GERD, non-H.pylori related gastric ulcer Sept 2023. She was doing well when seen in follow up June 2024.   Chronic constipation / intermittent lower abdominal cramping, generally relieved with defecation but relief is not immediate. She gives a remote history of IBS.   Chronic constipation, medication induced  History of lymphocytic colitis diagnosed August 2022.   Two unsuccessful attempts at discontinuation Budesonide. Continues to do well on maintenance dose of 3 mg daily . Still requires PPI ( risk factor for microscopic colitis)  GERD with LPR symptoms. Asymptomatic on daily PPI + Pepcid  Immunoglobulin deficiency (gets IVIG infusions)  CAD, s/p stent placement. On plavix.   PLAN   --Trial of Dicyclomine 10 mg daily as needed for abdominal cramps. Given # 30 with 3 refills.  --Okay to stop Colestipol as far as GI is concerned ( she believes it was started by her Immunologist) If this was for diarrhea then it can be stopped. She will check with her Immunologist to make sure no other reason she would be taking it. It is also possible that ID started the Colestid early on. Apparently, not taking it for high cholesterol . She takes Lovaza.   --Continue Budesonide 3 mg daily for lymphocytic colitis. She has had two unsuccessful attempts at discontinuing treatment  --Continue Protonix in am before breakfast and Famotidine 40 mg at bedtime --Continue bowel regimen. Unfortunately on a lot of constipating medications. Maybe discontinuation of Colestipol will help   HPI   Chief complaint : Was having abdominal pain a few months ago.   Brief GI History:  Patient was last seen 07/20/2022 by Hyacinth Meeker, PA.  At that time her lymphocytic colitis was  well-controlled on budesonide 3 mg daily. GERD controlled on PPI.   Interval History:  Patient gives a remote history of IBS characterized by constipation and abdominal cramping but was symptom free for years until a few months ago when she developed similar left sided abdominal pain.  She saw her PCP in mid Sept for the LLQ pain which had already improved after a bowel movement.  She tends towards constipation on chronic opioids.   She takes dulcolax ,miralax, and colace everyday. PCP recommended observation since she was better. She still gets intermittent generalized lower abdominal pain. Though pain gets better after a BM though it can take a couple of hours to get relief.    No longer taking Celebrex and any NSAIDs and consequently has a lot of joint pain    GI History / Pertinent GI Studies   **All endoscopic studies may not be included here    Nov 2023 EGD - document ulcer healing --Normal esophagus --The ulcer had fully healed.  -- Gastritis, biopsied -- Duodenal diverticulum Surgical [P], gastric body - PREDOMINANTLY OXYNTIC TYPE MUCOSA WITH NO SIGNIFICANT PATHOLOGY. - NO HELICOBACTER PYLORI ORGANISMS IDENTIFIED ON H&E STAINED SLIDE.  Sept 2023 EGD for GI bleed - LA Grade A reflux esophagitis with no bleeding. - Non-bleeding gastric ulcer with a clean ulcer base (Forrest Class III). Likely due to Celebrex. - Mild gastropathy. Biopsied. - Duodenal diverticulum.  A. STOMACH, ANTRUM, BODY AND FUNDUS, BIOPSY:  -  Antral mucosa with chemical/reactive gastropathy.  -  Oxyntic mucosa with no significant pathology.  -  No Helicobacter pylori organisms identified on HE stained slide.   April 2022 colonoscopy  -- The entire examined colon is normal. Biopsied. - One 2 mm polyp in the distal rectum, removed with a cold biopsy forceps. Resected and retrieved. - Non-bleeding internal hemorrhoids. - The examined portion of the ileum was normal. - There was significant looping of the  colon.  Surgical [P], right colon bx - COLITIS CONSISTENT WITH LYMPHOCYTIC COLITIS. - NO ACTIVE INFLAMMATION OR GRANULOMAS. 2. Surgical [P], left colon bx - COLITIS CONSISTENT WITH LYMPHOCYTIC COLITIS. - NO ACTIVE INFLAMMATION OR GRANULOMAS. 3. Surgical [P], colon, rectum, polyp (1) - FINDINGS CONSISTENT WITH MUCOSAL PROLAPSE POLYP. - NO ADENOMATOUS CHANGE OR CARCINOMA.     Latest Ref Rng & Units 11/20/2020    4:30 PM 05/29/2020    2:10 PM 03/12/2020   11:04 AM  Hepatic Function  Total Protein 6.0 - 8.3 g/dL 6.6  5.5  5.8   Albumin 3.5 - 5.2 g/dL 4.0     AST 0 - 37 U/L 34  20  27   ALT 0 - 35 U/L 29  13  20    Alk Phosphatase 39 - 117 U/L 84     Total Bilirubin 0.2 - 1.2 mg/dL 0.3  0.3  0.2        Latest Ref Rng & Units 01/07/2022    3:38 PM 12/18/2021    3:26 PM 11/10/2021    1:26 PM  CBC  WBC 3.8 - 10.8 Thousand/uL 5.7  5.6  6.0   Hemoglobin 11.7 - 15.5 g/dL 16.1  09.6  04.5   Hematocrit 35.0 - 45.0 % 36.4  38.9  33.0   Platelets  CANCELED  179.0  157.0      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   right sholder   BCC (basal cell carcinoma of skin) 05/09/2014   mid lower back   BCC (basal cell carcinoma of skin) 05/03/2017   right low back   BCC (basal cell carcinoma of skin) 05/03/2017   left upper back   BCC (basal cell carcinoma of skin) 07/05/2018   left mid back   BCC (basal cell carcinoma of skin) 05/20/1992   upper back   BCC (basal cell carcinoma of skin) 07/29/1993   left sholder medial   BCC (basal cell carcinoma of skin) 07/29/1993   left sholder lateral   BCC (basal cell carcinoma of skin) 07/29/1993   right thigh   BCC (basal cell carcinoma of skin) 07/29/1993   right sholder   BCC (basal cell  carcinoma of skin) 12/22/1994   right mid forearm   BCC (basal cell carcinoma of skin) 12/22/1994   right upper forearm   BCC (basal cell carcinoma of skin) 12/22/1994   lower right upper arm   BCC (basal cell carcinoma of skin) 12/22/1994   right upper arm sholder   BCC (basal cell carcinoma of skin) 08/11/1995   left leg below knee   BCC (basal cell carcinoma of skin) 04/11/2002   mid back   BCC (basal cell carcinoma of skin) 12/10/2002   right center upper back   BCC (basal cell carcinoma of skin) 05/26/2005  right post sholder   BCC (basal cell carcinoma) 05/09/2014   left inner shin   BCC (basal cell carcinoma) 06/12/2014   left forearm   Bowen's disease 10/07/1994   right post knee, right inner forearm/wrist   Bowen's disease 08/11/1995   left sholder   Bronchiectasis (HCC)    Cataract    left   Chronic pain    Common variable immunodeficiency [facility])    sees dr Ellin Goodie 02-11-2021 epic   Coronary artery disease    Depression    hx of   Diverticulosis of colon 1998   mild   DJD (degenerative joint disease)    Duodenal ulcer    h/o yrs ago   GERD (gastroesophageal reflux disease)    History of SCC (squamous cell carcinoma) of skin    Dr. Jorja Loa   History of sinus bradycardia    HLD (hyperlipidemia)    hypertriglyceridemia   Hypertensive retinopathy of both eyes 11/18/2017   Hypothyroidism    IBS (irritable bowel syndrome)    Internal hemorrhoids 1998   Lymphocytic colitis    Mitral regurgitation    mild   Nocardiosis    relased by infection disease dec 2022   Osteoarthritis    feet,shoulder,neck,back,hips and hands.   Pneumonia 2015   PONV (postoperative nausea and vomiting)    Rotator cuff tear, right 02/2019   infraspinatus and supraspinatus, and dislocation of long head of bicep tendon (Dr. Charlann Boxer)   SCC (squamous cell carcinoma) 11/26/2014   left hand, right hand, right deltoid mnay areas   SCC (squamous cell carcinoma) 05/03/2017   left cheek    Sleep apnea    uses a mouth guard nightly   Squamous cell carcinoma in situ (SCCIS) 07/05/2018   left hand   Tracheobronchomalacia    Trigger finger    left hand ring finger   Vitamin D deficiency    mild    Past Surgical History:  Procedure Laterality Date   ABDOMINAL EXPOSURE N/A 02/02/2022   Procedure: ABDOMINAL EXPOSURE;  Surgeon: Cephus Shelling, MD;  Location: [facility] OR;  Service: Vascular;  Laterality: N/A;   ABDOMINAL HYSTERECTOMY  1998   complete   BIOPSY  11/05/2021   Procedure: BIOPSY;  Surgeon: Tressia Danas, MD;  Location: [facility] ENDOSCOPY;  Service: Gastroenterology;;   BLEPHAROPLASTY Bilateral 01/2018   BROW LIFT Bilateral 10/10/2018   Procedure: Coralee Rud LIFT;  Surgeon: Peggye Form, DO;  Location: [facility];  Service: Plastics;  Laterality: Bilateral;   BUNIONECTOMY     R 12/08, L 2004 (Dr. Wynelle Cleveland)   CARDIAC CATHETERIZATION     CARPAL TUNNEL RELEASE  1989   bilateral   CARPAL TUNNEL RELEASE Left 03/06/2021   Procedure: Left Revision Carpal Tunnel Release with hypothenar fat pad flap;  Surgeon: Gomez Cleverly, MD;  Location: [facility];  Service: Orthopedics;  Laterality: Left;  with local anesthesia   CATARACT EXTRACTION Bilateral Left in 08/2012, Right 04/2016   Dr.Hecker   CESAREAN SECTION     1981 and 1986   CHOLECYSTECTOMY  1992   laparoscopic   CLOSED REDUCTION NASAL FRACTURE N/A 08/29/2018   Procedure: CLOSED REDUCTION NASAL FRACTURE;  Surgeon: Peggye Form, DO;  Location: [facility];  Service: Plastics;  Laterality: N/A;  1 hour, please   COLONOSCOPY  2006   COLONOSCOPY  01/2012   due again 01/2021; mild diverticulosis   CORONARY STENT INTERVENTION N/A 05/19/2021   Procedure: CORONARY STENT INTERVENTION;  Surgeon: End,  Cristal Deer, MD;  Location: [facility];  Service: Cardiovascular;  Laterality: N/A;   CORONARY ULTRASOUND/IVUS N/A 05/19/2021   Procedure: Intravascular  Ultrasound/IVUS;  Surgeon: Yvonne Kendall, MD;  Location: [facility];  Service: Cardiovascular;  Laterality: N/A;   epidural steroid injection, back  02/2010   ESOPHAGOGASTRODUODENOSCOPY (EGD) WITH PROPOFOL N/A 11/05/2021   Procedure: ESOPHAGOGASTRODUODENOSCOPY (EGD) WITH PROPOFOL;  Surgeon: Tressia Danas, MD;  Location: [facility] ENDOSCOPY;  Service: Gastroenterology;  Laterality: N/A;   HIP SURGERY     right bursectomy x 3   HIP SURGERY Right 2000   torn cartilage, repaired   INGUINAL HERNIA REPAIR  10/2007   bilat   LEFT HEART CATH AND CORONARY ANGIOGRAPHY N/A 05/19/2021   Procedure: LEFT HEART CATH AND CORONARY ANGIOGRAPHY;  Surgeon: Yvonne Kendall, MD;  Location: [facility];  Service: Cardiovascular;  Laterality: N/A;   LUMBAR FUSION  01/2022   NECK SURGERY  1989   c6-7 cervical laminectomy and diskecotmy   REVERSE SHOULDER ARTHROPLASTY Right 04/11/2019   Procedure: REVERSE SHOULDER ARTHROPLASTY;  Surgeon: Yolonda Kida, MD;  Location: [facility] OR;  Service: Orthopedics;  Laterality: Right;  Regional Block   SHOULDER SURGERY Left 03/2003   left rotator cuff repair   SHOULDER SURGERY Left 03/14/2018   rotator cuff repair; Dr. Shelle Iron   TONSILLECTOMY  age 76   TROCHANTERIC BURSA EXCISION Right 1978   TUBAL LIGATION  1986   UPPER GASTROINTESTINAL ENDOSCOPY     WISDOM TOOTH EXTRACTION     as teenager    Family History  Problem Relation Age of Onset   Depression Mother    Schizophrenia Mother    Cancer Father        oral   Heart disease Father        bradycardia   Esophageal cancer Father    Cancer Sister        skin and lung   COPD Sister    Hypertension Sister    Osteoporosis Sister        compression fx's x 3 11/2018   Hyperthyroidism Brother    Cancer Brother        metastatic cancer to bone (?primary)   Diabetes Maternal Grandmother    Cancer Paternal Grandmother 43       colon cancer, metastatic to liver   Colon cancer Paternal Grandmother     Liver cancer Paternal Grandmother    Hyperlipidemia Daughter    Asthma Son    ADD / ADHD Other     Current Medications, Allergies, Family History and Social History were reviewed in [employer] electronic medical record.     Current Outpatient Medications  Medication Sig Dispense Refill   acetaminophen (TYLENOL) 650 MG CR tablet SMARTSIG:1 Tablet(s) By Mouth     albuterol (VENTOLIN HFA) 108 (90 Base) MCG/ACT inhaler Inhale 2 puffs into the lungs every 4 (four) hours as needed for wheezing or shortness of breath. 6.7 g 1   amoxicillin (AMOXIL) 500 MG tablet      ARTIFICIAL TEAR OP Place 2 drops into both eyes in the morning and at bedtime.     atorvastatin (LIPITOR) 40 MG tablet Take 1 tablet (40 mg total) by mouth daily. 90 tablet 0   budesonide (ENTOCORT EC) 3 MG 24 hr capsule Take 1 capsule (3 mg total) by mouth daily. 90 capsule 5   Buprenorphine HCl (BELBUCA) 750 MCG FILM Dissolve 750 mcg (1 film) by mouth in the cheek 2 (two) times daily. 60  each 2   Calcium Carbonate Antacid (TUMS PO) Take 1 tablet by mouth as needed (heartburn).     Calcium Carbonate-Vitamin D 600-200 MG-UNIT TABS Take 1 tablet by mouth 2 (two) times daily.     clopidogrel (PLAVIX) 75 MG tablet Take 1 tablet (75 mg total) by mouth daily. Must keep scheduled appointment for future refills. Thank you. 30 tablet 0   diclofenac Sodium (VOLTAREN) 1 % GEL Apply 2 g topically 4 (four) times daily as needed. 240 g 2   dicyclomine (BENTYL) 10 MG capsule Take 1 capsule (10 mg total) by mouth 2 (two) times daily as needed for spasms. 60 capsule 3   docusate sodium (COLACE) 100 MG capsule Take 1 capsule (100 mg total) by mouth in the morning and at bedtime. 30 capsule 11   EPINEPHrine 0.3 mg/0.3 mL IJ SOAJ injection Inject 0.3 mg into the muscle as needed for anaphylaxis.     famotidine (PEPCID) 40 MG tablet Take 1 tablet (40 mg total) by mouth at bedtime. 90 tablet 1   FIBER PO Take 2 tablets by mouth in the morning and at  bedtime.     fluorouracil (EFUDEX) 5 % cream Apply two times a day to areas on left leg for 3 weeks. 40 g 0   guaiFENesin (MUCINEX) 600 MG 12 hr tablet Take 600 mg by mouth 2 (two) times daily.     HYDROcodone-acetaminophen (NORCO) 7.5-325 MG tablet Take 1 tablet by mouth 4 (four) times daily as needed for pain 120 tablet 0   HYDROcodone-acetaminophen (NORCO) 7.5-325 MG tablet Take 1 tablet by mouth 4 (four) times daily as needed for pain 120 tablet 0   ipratropium (ATROVENT) 0.06 % nasal spray Place 2 sprays into both nostrils 4 (four) times daily as needed for rhinitis. 45 mL 1   Lactobacillus-Inulin (CULTURELLE ADULT ULT BALANCE) CAPS Take 1 capsule by mouth daily.     levocetirizine (XYZAL) 5 MG tablet Take 1 tablet (5 mg total) by mouth every evening. (Patient taking differently: Take 5 mg by mouth daily.) 90 tablet 5   levothyroxine (SYNTHROID) 25 MCG tablet Take 1 tablet (25 mcg total) by mouth daily. 90 tablet 2   loratadine (CLARITIN) 10 MG tablet Take 1 tablet (10 mg total) by mouth daily as needed for allergies (Can take an extra dose during flare ups.). 180 tablet 1   melatonin 5 MG TABS Take 5 mg by mouth at bedtime.     methocarbamol (ROBAXIN) 500 MG tablet Take 1 tablet (500 mg total) by mouth 2 (two) times daily as needed. 120 tablet 2   montelukast (SINGULAIR) 10 MG tablet Take 1 tablet (10 mg total) by mouth at bedtime. 90 tablet 1   nitroGLYCERIN (NITROSTAT) 0.4 MG SL tablet Place 1 tablet (0.4 mg total) under the tongue every 5 (five) minutes as needed. 25 tablet 10   omega-3 acid ethyl esters (LOVAZA) 1 g capsule Take 2 capsules (2 g total) by mouth 2 (two) times daily. 360 capsule 0   pantoprazole (PROTONIX) 40 MG tablet Take 1 tablet (40 mg total) by mouth in the morning. 90 tablet 1   polyethylene glycol (MIRALAX / GLYCOLAX) 17 g packet Take 17 g by mouth daily.     pregabalin (LYRICA) 50 MG capsule Take 1 capsule (50 mg total) by mouth every 12 (twelve) hours stop gabapentin  60 capsule 2   psyllium (METAMUCIL) 58.6 % packet Take 1 packet by mouth daily.     valACYclovir (VALTREX) 500  MG tablet Take 1 tablet (500 mg total) by mouth daily. 90 tablet 1   venlafaxine XR (EFFEXOR XR) 75 MG 24 hr capsule Take 1 capsule (75 mg total) by mouth daily. 90 capsule 2   XEMBIFY 10 GM/50ML SOLN Inject 10 g into the skin once a week. Wednesday     No current facility-administered medications for this visit.    Review of Systems: No chest pain. No shortness of breath. No urinary complaints.    Physical Exam  There were no vitals filed for this visit. Wt Readings from Last 3 Encounters:  11/03/22 150 lb 6.4 oz (68.2 kg)  09/17/22 150 lb (68 kg)  08/25/22 151 lb 3.2 oz (68.6 kg)    BP 138/60   Pulse 64   Ht 5\' 4"  (1.626 m)   Wt 154 lb 6.4 oz (70 kg)   BMI 26.50 kg/m  Constitutional:  Pleasant, generally well appearing female in no acute distress. Psychiatric: Normal mood and affect. Behavior is normal. EENT: Pupils normal.  Conjunctivae are normal. No scleral icterus. Neck supple.  Cardiovascular: Normal rate, regular rhythm.  Pulmonary/chest: Effort normal and breath sounds normal. No wheezing, rales or rhonchi. Abdominal: Soft, nondistended, nontender. Bowel sounds active throughout. There are no masses palpable. No hepatomegaly. Neurological: Alert and oriented to person place and time.    Willette Cluster, NP  01/20/2023, 9:22 AM  Cc:  [name], [name]

## 2023-01-21 ENCOUNTER — Other Ambulatory Visit (HOSPITAL_COMMUNITY): Payer: Self-pay

## 2023-01-21 ENCOUNTER — Other Ambulatory Visit: Payer: Self-pay

## 2023-01-21 ENCOUNTER — Telehealth: Payer: Self-pay

## 2023-01-21 NOTE — Telephone Encounter (Signed)
Received Preoperative Clearance Form from [facility] - DOB verified.  Form has been placed in provider's in basket for review/complete/sign on Tuesday, 01/26/23, when returns to [name].  Forwarding message to provider.

## 2023-01-22 ENCOUNTER — Other Ambulatory Visit (HOSPITAL_COMMUNITY): Payer: Self-pay

## 2023-01-25 DIAGNOSIS — L905 Scar conditions and fibrosis of skin: Secondary | ICD-10-CM | POA: Diagnosis not present

## 2023-01-25 DIAGNOSIS — L570 Actinic keratosis: Secondary | ICD-10-CM | POA: Diagnosis not present

## 2023-01-25 DIAGNOSIS — Z85828 Personal history of other malignant neoplasm of skin: Secondary | ICD-10-CM | POA: Diagnosis not present

## 2023-01-25 DIAGNOSIS — D1801 Hemangioma of skin and subcutaneous tissue: Secondary | ICD-10-CM | POA: Diagnosis not present

## 2023-01-25 DIAGNOSIS — Z08 Encounter for follow-up examination after completed treatment for malignant neoplasm: Secondary | ICD-10-CM | POA: Diagnosis not present

## 2023-01-25 DIAGNOSIS — C44321 Squamous cell carcinoma of skin of nose: Secondary | ICD-10-CM | POA: Diagnosis not present

## 2023-01-25 DIAGNOSIS — D485 Neoplasm of uncertain behavior of skin: Secondary | ICD-10-CM | POA: Diagnosis not present

## 2023-01-25 DIAGNOSIS — L814 Other melanin hyperpigmentation: Secondary | ICD-10-CM | POA: Diagnosis not present

## 2023-01-25 DIAGNOSIS — L821 Other seborrheic keratosis: Secondary | ICD-10-CM | POA: Diagnosis not present

## 2023-01-25 DIAGNOSIS — Z86007 Personal history of in-situ neoplasm of skin: Secondary | ICD-10-CM | POA: Diagnosis not present

## 2023-01-26 ENCOUNTER — Ambulatory Visit: Payer: Medicare PPO | Attending: Physician Assistant | Admitting: Physician Assistant

## 2023-01-26 ENCOUNTER — Encounter: Payer: Self-pay | Admitting: Physician Assistant

## 2023-01-26 ENCOUNTER — Encounter: Payer: Self-pay | Admitting: Allergy and Immunology

## 2023-01-26 VITALS — BP 136/76 | HR 68 | Ht 64.0 in | Wt 154.6 lb

## 2023-01-26 DIAGNOSIS — E785 Hyperlipidemia, unspecified: Secondary | ICD-10-CM

## 2023-01-26 DIAGNOSIS — I700 Atherosclerosis of aorta: Secondary | ICD-10-CM | POA: Diagnosis not present

## 2023-01-26 DIAGNOSIS — Z8719 Personal history of other diseases of the digestive system: Secondary | ICD-10-CM

## 2023-01-26 DIAGNOSIS — Z01818 Encounter for other preprocedural examination: Secondary | ICD-10-CM | POA: Diagnosis not present

## 2023-01-26 DIAGNOSIS — I471 Supraventricular tachycardia, unspecified: Secondary | ICD-10-CM | POA: Diagnosis not present

## 2023-01-26 DIAGNOSIS — J849 Interstitial pulmonary disease, unspecified: Secondary | ICD-10-CM

## 2023-01-26 DIAGNOSIS — I2510 Atherosclerotic heart disease of native coronary artery without angina pectoris: Secondary | ICD-10-CM | POA: Diagnosis not present

## 2023-01-26 DIAGNOSIS — J398 Other specified diseases of upper respiratory tract: Secondary | ICD-10-CM | POA: Diagnosis not present

## 2023-01-26 DIAGNOSIS — G4733 Obstructive sleep apnea (adult) (pediatric): Secondary | ICD-10-CM

## 2023-01-26 NOTE — Patient Instructions (Signed)
Medication Instructions:  No changes *If you need a refill on your cardiac medications before your next appointment, please call your pharmacy*   Lab Work: CBC,CMET, Lipid Panel If you have labs (blood work) drawn today and your tests are completely normal, you will receive your results only by: MyChart Message (if you have MyChart) OR A paper copy in the mail If you have any lab test that is abnormal or we need to change your treatment, we will call you to review the results.   Testing/Procedures: No Testing   Follow-Up: At [facility], you and your health needs are our priority.  As part of our continuing mission to provide you with exceptional heart care, we have created designated Provider Care Teams.  These Care Teams include your primary Cardiologist (physician) and Advanced Practice Providers (APPs -  Physician Assistants and Nurse Practitioners) who all work together to provide you with the care you need, when you need it.  We recommend signing up for the patient portal called "MyChart".  Sign up information is provided on this After Visit Summary.  MyChart is used to connect with patients for Virtual Visits (Telemedicine).  Patients are able to view lab/test results, encounter notes, upcoming appointments, etc.  Non-urgent messages can be sent to your provider as well.   To learn more about what you can do with MyChart, go to [email or web address].    Your next appointment:   1 year(s)  Provider:   Charlton Haws, MD

## 2023-01-26 NOTE — Telephone Encounter (Signed)
Pre Operative Clearance Form has been completed and faxed to [facility].

## 2023-01-27 LAB — COMPREHENSIVE METABOLIC PANEL
ALT: 23 [IU]/L (ref 0–32)
AST: 29 [IU]/L (ref 0–40)
Albumin: 4.5 g/dL (ref 3.8–4.8)
Alkaline Phosphatase: 102 [IU]/L (ref 44–121)
BUN/Creatinine Ratio: 16 (ref 12–28)
BUN: 10 mg/dL (ref 8–27)
Bilirubin Total: 0.4 mg/dL (ref 0.0–1.2)
CO2: 24 mmol/L (ref 20–29)
Calcium: 9.5 mg/dL (ref 8.7–10.3)
Chloride: 106 mmol/L (ref 96–106)
Creatinine, Ser: 0.62 mg/dL (ref 0.57–1.00)
Globulin, Total: 2.3 g/dL (ref 1.5–4.5)
Glucose: 80 mg/dL (ref 70–99)
Potassium: 4 mmol/L (ref 3.5–5.2)
Sodium: 144 mmol/L (ref 134–144)
Total Protein: 6.8 g/dL (ref 6.0–8.5)
eGFR: 93 mL/min/{1.73_m2} (ref >59)

## 2023-01-27 LAB — CBC
Hematocrit: 40.9 % (ref 34.0–46.6)
Hemoglobin: 13.6 g/dL (ref 11.1–15.9)
MCH: 33.1 pg — ABNORMAL HIGH (ref 26.6–33.0)
MCHC: 33.3 g/dL (ref 31.5–35.7)
MCV: 100 fL — ABNORMAL HIGH (ref 79–97)
Platelets: 155 10*3/uL (ref 150–450)
RBC: 4.11 x10E6/uL (ref 3.77–5.28)
RDW: 14.5 % (ref 11.7–15.4)
WBC: 4.5 10*3/uL (ref 3.4–10.8)

## 2023-01-27 LAB — LIPID PANEL
Chol/HDL Ratio: 3.2 {ratio} (ref 0.0–4.4)
Cholesterol, Total: 148 mg/dL (ref 100–199)
HDL: 46 mg/dL (ref >39)
LDL Chol Calc (NIH): 67 mg/dL (ref 0–99)
Triglycerides: 216 mg/dL — ABNORMAL HIGH (ref 0–149)
VLDL Cholesterol Cal: 35 mg/dL (ref 5–40)

## 2023-01-28 ENCOUNTER — Other Ambulatory Visit (HOSPITAL_COMMUNITY): Payer: Self-pay

## 2023-01-28 ENCOUNTER — Other Ambulatory Visit: Payer: Self-pay

## 2023-01-28 ENCOUNTER — Other Ambulatory Visit: Payer: Self-pay | Admitting: Internal Medicine

## 2023-01-28 DIAGNOSIS — E039 Hypothyroidism, unspecified: Secondary | ICD-10-CM

## 2023-01-28 MED ORDER — LEVOTHYROXINE SODIUM 25 MCG PO TABS
25.0000 ug | ORAL_TABLET | Freq: Every day | ORAL | 0 refills | Status: DC
  Filled 2023-01-28: qty 90, 90d supply, fill #0

## 2023-02-01 ENCOUNTER — Other Ambulatory Visit: Payer: Self-pay

## 2023-02-01 ENCOUNTER — Other Ambulatory Visit (HOSPITAL_COMMUNITY): Payer: Self-pay

## 2023-02-01 MED ORDER — FLUOROURACIL 5 % EX CREA
1.0000 | TOPICAL_CREAM | Freq: Two times a day (BID) | CUTANEOUS | 0 refills | Status: AC
  Filled 2023-02-01: qty 40, 14d supply, fill #0

## 2023-02-02 ENCOUNTER — Encounter: Payer: Self-pay | Admitting: Internal Medicine

## 2023-02-02 ENCOUNTER — Encounter: Payer: Medicare PPO | Admitting: Internal Medicine

## 2023-02-03 ENCOUNTER — Encounter: Payer: Self-pay | Admitting: Internal Medicine

## 2023-02-03 ENCOUNTER — Ambulatory Visit (INDEPENDENT_AMBULATORY_CARE_PROVIDER_SITE_OTHER): Payer: Medicare PPO | Admitting: Internal Medicine

## 2023-02-03 VITALS — BP 120/78 | HR 60 | Temp 98.1°F | Ht 64.5 in | Wt 153.6 lb

## 2023-02-03 DIAGNOSIS — E039 Hypothyroidism, unspecified: Secondary | ICD-10-CM | POA: Diagnosis not present

## 2023-02-03 DIAGNOSIS — Z0000 Encounter for general adult medical examination without abnormal findings: Secondary | ICD-10-CM | POA: Diagnosis not present

## 2023-02-03 NOTE — Progress Notes (Signed)
Established Patient Office Visit     CC/Reason for Visit: Annual preventive exam and subsequent Medicare wellness visit  HPI: Beverly [name] is a 77 y.o. female who is coming in today for the above mentioned reasons. Past Medical History is significant for:  Obstructive sleep apnea, hyperlipidemia, chronic pain syndrome, asthma, GERD, CIVD with hypoglycemic globule anemia, lymphocytic colitis and vitamin D deficiency.  Also has a history of coronary artery disease and GI bleed.  She is currently feeling well.  She will be having right knee replacement soon.  Immunizations are up-to-date.  Cancer screening is up-to-date.  DEXA scan is up-to-date.   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   right sholder   BCC (basal cell carcinoma of skin) 05/09/2014   mid lower back   BCC (basal cell carcinoma of skin) 05/03/2017   right low back   BCC (basal cell carcinoma of skin) 05/03/2017   left upper back   BCC (basal cell carcinoma of skin) 07/05/2018   left mid back   BCC (basal cell carcinoma of skin) 05/20/1992   upper back   BCC (basal cell carcinoma of skin) 07/29/1993   left sholder medial   BCC (basal cell carcinoma of skin) 07/29/1993   left sholder lateral   BCC (basal cell carcinoma of skin) 07/29/1993   right thigh   BCC (basal cell carcinoma of skin) 07/29/1993   right sholder   BCC (basal cell carcinoma of skin) 12/22/1994   right mid forearm   BCC (basal cell carcinoma of skin) 12/22/1994   right upper forearm   BCC (basal cell carcinoma of skin) 12/22/1994   lower right upper arm   BCC (basal cell carcinoma of skin) 12/22/1994   right upper arm sholder   BCC (basal cell carcinoma  of skin) 08/11/1995   left leg below knee   BCC (basal cell carcinoma of skin) 04/11/2002   mid back   BCC (basal cell carcinoma of skin) 12/10/2002   right center upper back   BCC (basal cell carcinoma of skin) 05/26/2005   right post sholder   BCC (basal cell carcinoma) 05/09/2014   left inner shin   BCC (basal cell carcinoma) 06/12/2014   left forearm   Blood transfusion without reported diagnosis 11/06/21   Bowen's disease 10/07/1994   right post knee, right inner forearm/wrist   Bowen's disease 08/11/1995   left sholder   Bronchiectasis (HCC)    Cataract    left   Chronic pain    Common variable immunodeficiency (HCC)    sees dr Ellin Goodie 02-11-2021 epic   COPD (chronic obstructive pulmonary disease) (HCC) 2022   Brochiectosis / tracheobrocheomalacia   Coronary artery disease    Depression    hx of   Diverticulosis of colon 1998   mild   DJD (degenerative joint disease)    Duodenal ulcer    h/o yrs ago   GERD (gastroesophageal reflux disease)    Heart murmur 1949   History of SCC (squamous cell carcinoma) of skin    Dr. Jorja Loa   History of sinus bradycardia    HLD (hyperlipidemia)    hypertriglyceridemia  Hypertensive retinopathy of both eyes 11/18/2017   Hypothyroidism    IBS (irritable bowel syndrome)    Internal hemorrhoids 1998   Lymphocytic colitis    Mitral regurgitation    mild   Myocardial infarction [facility]) 2023   Cardiac CT 2023 - 2 cardiac stents placed   Nocardiosis    relased by infection disease dec 2022   Osteoarthritis    feet,shoulder,neck,back,hips and hands.   Pneumonia 2015   PONV (postoperative nausea and vomiting)    Rotator cuff tear, right 02/2019   infraspinatus and supraspinatus, and dislocation of long head of bicep tendon (Dr. Charlann Boxer)   SCC (squamous cell carcinoma) 11/26/2014   left hand, right hand, right deltoid mnay areas   SCC (squamous cell carcinoma) 05/03/2017   left cheek   Sleep apnea    uses a mouth guard nightly    Squamous cell carcinoma in situ (SCCIS) 07/05/2018   left hand   Tracheobronchomalacia    Trigger finger    left hand ring finger   Vitamin D deficiency    mild    Past Surgical History:  Procedure Laterality Date   ABDOMINAL EXPOSURE N/A 02/02/2022   Procedure: ABDOMINAL EXPOSURE;  Surgeon: Cephus Shelling, MD;  Location: [facility] OR;  Service: Vascular;  Laterality: N/A;   ABDOMINAL HYSTERECTOMY  1998   complete   BIOPSY  11/05/2021   Procedure: BIOPSY;  Surgeon: Tressia Danas, MD;  Location: [facility] ENDOSCOPY;  Service: Gastroenterology;;   BLEPHAROPLASTY Bilateral 01/2018   BROW LIFT Bilateral 10/10/2018   Procedure: Coralee Rud LIFT;  Surgeon: Peggye Form, DO;  Location: [facility];  Service: Plastics;  Laterality: Bilateral;   BUNIONECTOMY     R 12/08, L 2004 (Dr. Wynelle Cleveland)   CARDIAC CATHETERIZATION     CARPAL TUNNEL RELEASE  1989   bilateral   CARPAL TUNNEL RELEASE Left 03/06/2021   Procedure: Left Revision Carpal Tunnel Release with hypothenar fat pad flap;  Surgeon: Gomez Cleverly, MD;  Location: [facility];  Service: Orthopedics;  Laterality: Left;  with local anesthesia   CATARACT EXTRACTION Bilateral Left in 08/2012, Right 04/2016   Dr.Hecker   CESAREAN SECTION     1981 and 1986   CHOLECYSTECTOMY  1992   laparoscopic   CLOSED REDUCTION NASAL FRACTURE N/A 08/29/2018   Procedure: CLOSED REDUCTION NASAL FRACTURE;  Surgeon: Peggye Form, DO;  Location: [facility];  Service: Plastics;  Laterality: N/A;  1 hour, please   COLONOSCOPY  2006   COLONOSCOPY  01/2012   due again 01/2021; mild diverticulosis   CORONARY STENT INTERVENTION N/A 05/19/2021   Procedure: CORONARY STENT INTERVENTION;  Surgeon: Yvonne Kendall, MD;  Location: [facility] INVASIVE CV LAB;  Service: Cardiovascular;  Laterality: N/A;   CORONARY ULTRASOUND/IVUS N/A 05/19/2021   Procedure: Intravascular Ultrasound/IVUS;  Surgeon: Yvonne Kendall, MD;   Location: [facility] INVASIVE CV LAB;  Service: Cardiovascular;  Laterality: N/A;   COSMETIC SURGERY  2018   epidural steroid injection, back  02/2010   ESOPHAGOGASTRODUODENOSCOPY (EGD) WITH PROPOFOL N/A 11/05/2021   Procedure: ESOPHAGOGASTRODUODENOSCOPY (EGD) WITH PROPOFOL;  Surgeon: Tressia Danas, MD;  Location: [facility] ENDOSCOPY;  Service: Gastroenterology;  Laterality: N/A;   EYE SURGERY  2017   HIP SURGERY     right bursectomy x 3   HIP SURGERY Right 2000   torn cartilage, repaired   INGUINAL HERNIA REPAIR  10/2007   bilat   JOINT REPLACEMENT  04/11/2019   Right shoulder   LEFT HEART CATH AND CORONARY  ANGIOGRAPHY N/A 05/19/2021   Procedure: LEFT HEART CATH AND CORONARY ANGIOGRAPHY;  Surgeon: Yvonne Kendall, MD;  Location: [facility] INVASIVE CV LAB;  Service: Cardiovascular;  Laterality: N/A;   LUMBAR FUSION  01/2022   NECK SURGERY  1989   c6-7 cervical laminectomy and diskecotmy   REVERSE SHOULDER ARTHROPLASTY Right 04/11/2019   Procedure: REVERSE SHOULDER ARTHROPLASTY;  Surgeon: Yolonda Kida, MD;  Location: [facility] OR;  Service: Orthopedics;  Laterality: Right;  Regional Block   SHOULDER SURGERY Left 03/2003   left rotator cuff repair   SHOULDER SURGERY Left 03/14/2018   rotator cuff repair; Dr. Shelle Iron   SPINE SURGERY  1989 / lumbar   TONSILLECTOMY  age 25   TROCHANTERIC BURSA EXCISION Right 1978   TUBAL LIGATION  1986   UPPER GASTROINTESTINAL ENDOSCOPY     WISDOM TOOTH EXTRACTION     as teenager    Social History:  reports that she has never smoked. She has been exposed to tobacco smoke. She has never used smokeless tobacco. She reports current alcohol use of about 3.0 standard drinks of alcohol per week. She reports that she does not use drugs.  Allergies: Allergies  Allergen Reactions   Adhesive [Tape] Rash    Blister    Codeine Rash    Family History:  Family History  Problem Relation Age of Onset   Depression Mother    Schizophrenia Mother    Arthritis Mother     Cancer Father        oral   Heart disease Father        bradycardia   Esophageal cancer Father    Cancer Sister        skin and lung   COPD Sister    Hypertension Sister    Osteoporosis Sister        compression fx's x 3 11/2018   Depression Sister    Miscarriages / Stillbirths Sister    Hyperthyroidism Brother    Cancer Brother        metastatic cancer to bone (?primary)   Drug abuse Brother    Learning disabilities Brother    Diabetes Maternal Grandmother    Hearing loss Maternal Grandfather    Cancer Paternal Grandmother 39       colon cancer, metastatic to liver   Colon cancer Paternal Grandmother    Liver cancer Paternal Grandmother    Hyperlipidemia Daughter    Asthma Son    ADD / ADHD Other      Current Outpatient Medications:    acetaminophen (TYLENOL) 650 MG CR tablet, SMARTSIG:1 Tablet(s) By Mouth, Disp: , Rfl:    albuterol (VENTOLIN HFA) 108 (90 Base) MCG/ACT inhaler, Inhale 2 puffs into the lungs every 4 (four) hours as needed for wheezing or shortness of breath., Disp: 6.7 g, Rfl: 1   amoxicillin (AMOXIL) 500 MG tablet, , Disp: , Rfl:    ARTIFICIAL TEAR OP, Place 2 drops into both eyes in the morning and at bedtime., Disp: , Rfl:    atorvastatin (LIPITOR) 40 MG tablet, Take 1 tablet (40 mg total) by mouth daily., Disp: 90 tablet, Rfl: 0   budesonide (ENTOCORT EC) 3 MG 24 hr capsule, Take 1 capsule (3 mg total) by mouth daily., Disp: 90 capsule, Rfl: 5   Buprenorphine HCl (BELBUCA) 750 MCG FILM, Dissolve 750 mcg (1 film) by mouth in the cheek 2 (two) times daily., Disp: 60 each, Rfl: 2   Calcium Carbonate Antacid (TUMS PO), Take 1 tablet by mouth as needed (  heartburn)., Disp: , Rfl:    Calcium Carbonate-Vitamin D 600-200 MG-UNIT TABS, Take 1 tablet by mouth 2 (two) times daily., Disp: , Rfl:    clopidogrel (PLAVIX) 75 MG tablet, Take 1 tablet (75 mg total) by mouth daily. Must keep scheduled appointment for future refills. Thank you., Disp: 30 tablet, Rfl: 0    diclofenac Sodium (VOLTAREN) 1 % GEL, Apply 2 g topically 4 (four) times daily as needed., Disp: 240 g, Rfl: 2   dicyclomine (BENTYL) 10 MG capsule, Take 1 capsule (10 mg total) by mouth 2 (two) times daily as needed for spasms., Disp: 60 capsule, Rfl: 3   docusate sodium (COLACE) 100 MG capsule, Take 1 capsule (100 mg total) by mouth in the morning and at bedtime., Disp: 30 capsule, Rfl: 11   EPINEPHrine 0.3 mg/0.3 mL IJ SOAJ injection, Inject 0.3 mg into the muscle as needed for anaphylaxis., Disp: , Rfl:    famotidine (PEPCID) 40 MG tablet, Take 1 tablet (40 mg total) by mouth at bedtime., Disp: 90 tablet, Rfl: 1   FIBER PO, Take 2 tablets by mouth in the morning and at bedtime., Disp: , Rfl:    fluorouracil (EFUDEX) 5 % cream, Apply two times a day to areas on left leg for 3 weeks., Disp: 40 g, Rfl: 0   fluorouracil (EFUDEX) 5 % cream, Apply two times a day to areas on hands for 2 weeks., Disp: 40 g, Rfl: 0   guaiFENesin (MUCINEX) 600 MG 12 hr tablet, Take 600 mg by mouth 2 (two) times daily., Disp: , Rfl:    HYDROcodone-acetaminophen (NORCO) 7.5-325 MG tablet, Take 1 tablet by mouth 4 (four) times daily as needed for pain, Disp: 120 tablet, Rfl: 0   HYDROcodone-acetaminophen (NORCO) 7.5-325 MG tablet, Take 1 tablet by mouth 4 (four) times daily as needed for pain, Disp: 120 tablet, Rfl: 0   ipratropium (ATROVENT) 0.06 % nasal spray, Place 2 sprays into both nostrils 4 (four) times daily as needed for rhinitis., Disp: 45 mL, Rfl: 1   Lactobacillus-Inulin (CULTURELLE ADULT ULT BALANCE) CAPS, Take 1 capsule by mouth daily., Disp: , Rfl:    levocetirizine (XYZAL) 5 MG tablet, Take 1 tablet (5 mg total) by mouth every evening. (Patient taking differently: Take 5 mg by mouth daily.), Disp: 90 tablet, Rfl: 5   levothyroxine (SYNTHROID) 25 MCG tablet, Take 1 tablet (25 mcg total) by mouth daily., Disp: 90 tablet, Rfl: 0   loratadine (CLARITIN) 10 MG tablet, Take 1 tablet (10 mg total) by mouth daily as  needed for allergies (Can take an extra dose during flare ups.)., Disp: 180 tablet, Rfl: 1   melatonin 5 MG TABS, Take 5 mg by mouth at bedtime., Disp: , Rfl:    methocarbamol (ROBAXIN) 500 MG tablet, Take 1 tablet (500 mg total) by mouth 2 (two) times daily as needed., Disp: 120 tablet, Rfl: 2   montelukast (SINGULAIR) 10 MG tablet, Take 1 tablet (10 mg total) by mouth at bedtime., Disp: 90 tablet, Rfl: 1   nitroGLYCERIN (NITROSTAT) 0.4 MG SL tablet, Place 1 tablet (0.4 mg total) under the tongue every 5 (five) minutes as needed., Disp: 25 tablet, Rfl: 10   omega-3 acid ethyl esters (LOVAZA) 1 g capsule, Take 2 capsules (2 g total) by mouth 2 (two) times daily., Disp: 360 capsule, Rfl: 0   pantoprazole (PROTONIX) 40 MG tablet, Take 1 tablet (40 mg total) by mouth in the morning., Disp: 90 tablet, Rfl: 1   polyethylene glycol (MIRALAX /  GLYCOLAX) 17 g packet, Take 17 g by mouth daily., Disp: , Rfl:    pregabalin (LYRICA) 50 MG capsule, Take 1 capsule (50 mg total) by mouth every 12 (twelve) hours stop gabapentin, Disp: 60 capsule, Rfl: 2   psyllium (METAMUCIL) 58.6 % packet, Take 1 packet by mouth daily., Disp: , Rfl:    valACYclovir (VALTREX) 500 MG tablet, Take 1 tablet (500 mg total) by mouth daily., Disp: 90 tablet, Rfl: 1   venlafaxine XR (EFFEXOR XR) 75 MG 24 hr capsule, Take 1 capsule (75 mg total) by mouth daily., Disp: 90 capsule, Rfl: 2   XEMBIFY 10 GM/50ML SOLN, Inject 10 g into the skin once a week. Wednesday, Disp: , Rfl:   Review of Systems:  Negative unless indicated in HPI.   Physical Exam: Vitals:   02/03/23 1543  BP: 120/78  Pulse: 60  Temp: 98.1 F (36.7 C)  TempSrc: Oral  SpO2: 98%  Weight: 153 lb 9.6 oz (69.7 kg)  Height: 5' 4.5" (1.638 m)    Body mass index is 25.96 kg/m.   Physical Exam Vitals reviewed.  Constitutional:      General: She is not in acute distress.    Appearance: Normal appearance. She is not ill-appearing, toxic-appearing or diaphoretic.   HENT:     Head: Normocephalic.     Right Ear: Tympanic membrane, ear canal and external ear normal. There is no impacted cerumen.     Left Ear: Tympanic membrane, ear canal and external ear normal. There is no impacted cerumen.     Nose: Nose normal.     Mouth/Throat:     Mouth: Mucous membranes are moist.     Pharynx: Oropharynx is clear. No oropharyngeal exudate or posterior oropharyngeal erythema.  Eyes:     General: No scleral icterus.       Right eye: No discharge.        Left eye: No discharge.     Conjunctiva/sclera: Conjunctivae normal.     Pupils: Pupils are equal, round, and reactive to light.  Neck:     Vascular: No carotid bruit.  Cardiovascular:     Rate and Rhythm: Normal rate and regular rhythm.     Pulses: Normal pulses.     Heart sounds: Normal heart sounds.  Pulmonary:     Effort: Pulmonary effort is normal. No respiratory distress.     Breath sounds: Normal breath sounds.  Abdominal:     General: Abdomen is flat. Bowel sounds are normal.     Palpations: Abdomen is soft.  Musculoskeletal:        General: Normal range of motion.     Cervical back: Normal range of motion.  Skin:    General: Skin is warm and dry.  Neurological:     General: No focal deficit present.     Mental Status: She is alert and oriented to person, place, and time. Mental status is at baseline.  Psychiatric:        Mood and Affect: Mood normal.        Behavior: Behavior normal.        Thought Content: Thought content normal.        Judgment: Judgment normal.     Subsequent Medicare wellness visit   1. Risk factors, based on past  M,S,F - Cardiac Risk Factors include: advanced age (>75men, >50 women)   2.  Physical activities: Dietary issues and exercise activities discussed:      3.  Depression/mood:  [employer]  Visit from 02/03/2023 in [facility] Total Score 1        4.  ADL's:    02/03/2023    3:41 PM  In your  present state of health, do you have any difficulty performing the following activities:  Hearing? 0  Vision? 0  Difficulty concentrating or making decisions? 0  Walking or climbing stairs? 1  Comment knee pain - surgery in January  Dressing or bathing? 0  Doing errands, shopping? 0  Preparing Food and eating ? N  Using the Toilet? N  In the past six months, have you accidently leaked urine? Y  Do you have problems with loss of bowel control? N  Managing your Medications? N  Managing your Finances? N  Housekeeping or managing your Housekeeping? N     5.  Fall risk:     02/03/2022    7:50 PM 03/18/2022    4:21 PM 10/29/2022    2:26 PM 11/03/2022   11:16 AM 02/03/2023    3:44 PM  Fall Risk  Falls in the past year?  1 1 1 1   Was there an injury with Fall?  0 0 0 0  Fall Risk Category Calculator  2 1  1 2   (RETIRED) Patient Fall Risk Level Moderate fall risk      Patient at Risk for Falls Due to  Impaired balance/gait     Fall risk Follow up  Falls evaluation completed  Falls evaluation completed Falls evaluation completed     Patient-reported     6.  Home safety: No problems identified   7.  Height weight, and visual acuity: height and weight as above, vision/hearing: No results found.   8.  Counseling: Counseling given: Not Answered    9. Lab orders based on risk factors: Laboratory update will be reviewed   10. Cognitive assessment:        02/03/2023    3:45 PM  6CIT Screen  What Year? 0 points  What month? 0 points  What time? 0 points  Count back from 20 0 points  Months in reverse 0 points  Repeat phrase 0 points  Total Score 0 points     11. Screening: Patient provided with a written and personalized 5-10 year screening schedule in the AVS. Health Maintenance  Topic Date Due   COVID-19 Vaccine (8 - 2024-25 season) 10/18/2022   Medicare Annual Wellness Visit  02/03/2024   DTaP/Tdap/Td vaccine (3 - Td or Tdap) 10/07/2024   Colon Cancer Screening   05/25/2030   Pneumonia Vaccine  Completed   Flu Shot  Completed   DEXA scan (bone density measurement)  Completed   Hepatitis C Screening  Completed   Zoster (Shingles) Vaccine  Completed   HPV Vaccine  Aged Out    12. Provider List Update: Patient Care Team    Relationship Specialty Notifications Start End  [name], Limmie Patricia, MD PCP - General Internal Medicine  07/28/19   Wendall Stade, MD PCP - Cardiology Cardiology  06/23/19   Odette Fraction, MD Consulting Physician Infectious Diseases  11/13/19   Janalyn Harder, MD (Inactive) Consulting Physician Dermatology  05/27/20   Shellia Cleverly, DO Consulting Physician Gastroenterology  11/03/22      13. Advance Directives: Does Patient Have a Medical Advance Directive?: Yes Type of Advance Directive: Healthcare Power of Attorney, Living will Does patient want to make changes to medical advance directive?: No - Patient declined Copy of Healthcare  Power of Attorney in Chart?: No - copy requested  14. Opioids: Patient is on chronic opioids. She has a signed pain contract not with me.  Has not displayed any signs of an opioid-use disorder.   15.   Goals      Activity and Exercise Increased     Evidence-based guidance:  Review current exercise levels.  Assess patient perspective on exercise or activity level, barriers to increasing activity, motivation and readiness for change.  Recommend or set healthy exercise goal based on individual tolerance.  Encourage small steps toward making change in amount of exercise or activity.  Urge reduction of sedentary activities or screen time.  Promote group activities within the community or with family or support person.  Consider referral to rehabiliation therapist for assessment and exercise/activity plan.   Notes:          I have personally reviewed and noted the following in the patient's chart:   Medical and social history Use of alcohol, tobacco or illicit drugs  Current  medications and supplements Functional ability and status Nutritional status Physical activity Advanced directives List of other physicians Hospitalizations, surgeries, and ER visits in previous 12 months Vitals Screenings to include cognitive, depression, and falls Referrals and appointments  In addition, I have reviewed and discussed with patient certain preventive protocols, quality metrics, and best practice recommendations. A written personalized care plan for preventive services as well as general preventive health recommendations were provided to patient.  Impression and Plan:  Medicare annual wellness visit, subsequent  Hypothyroidism, unspecified type -     TSH; Future   -Recommend routine eye and dental care. -Healthy lifestyle discussed in detail. -Labs to be updated today. -Prostate cancer screening: N/A Health Maintenance  Topic Date Due   COVID-19 Vaccine (8 - 2024-25 season) 10/18/2022   Medicare Annual Wellness Visit  02/03/2024   DTaP/Tdap/Td vaccine (3 - Td or Tdap) 10/07/2024   Colon Cancer Screening  05/25/2030   Pneumonia Vaccine  Completed   Flu Shot  Completed   DEXA scan (bone density measurement)  Completed   Hepatitis C Screening  Completed   Zoster (Shingles) Vaccine  Completed   HPV Vaccine  Aged Out        Morrie Daywalt [name], MD [facility]

## 2023-02-04 ENCOUNTER — Ambulatory Visit: Payer: Medicare PPO | Admitting: Physician Assistant

## 2023-02-04 ENCOUNTER — Telehealth: Payer: Self-pay | Admitting: Internal Medicine

## 2023-02-04 ENCOUNTER — Other Ambulatory Visit (INDEPENDENT_AMBULATORY_CARE_PROVIDER_SITE_OTHER): Payer: Medicare PPO

## 2023-02-04 DIAGNOSIS — E039 Hypothyroidism, unspecified: Secondary | ICD-10-CM

## 2023-02-04 NOTE — Telephone Encounter (Signed)
Fax received from Dr. Malon Kindle with  to perform a right total knee arthroplasty on patient.  Patient needs surgery clearance. Surgery is pending. Patient was seen on 06/24/22- was told to f/u in a year . Office protocol is a risk assessment can be sent to surgeon if patient has been seen in 60 days or less.   Sending to Dr Celine Mans for risk assessment or recommendations if patient needs to be seen in office prior to surgical procedure.

## 2023-02-05 LAB — TSH: TSH: 3.88 u[IU]/mL (ref 0.35–5.50)

## 2023-02-06 NOTE — Telephone Encounter (Signed)
Received request for pre-operative evaluation for knee arthroscopy for Beverly [name].   ASSESSMENT: - This patient has a low risk of post-operative pulmonary complications by ARISCAT Index.  The absolute assessment of risk/benefit of the procedure is deferred to the primary team's evaluation.  RECOMMENDATIONS:  In order to minimize the risk of complications and optimize pulmonary status, we recommend the following: - Encourage aggressive incentive spirometry hourly both peri-operatively and post-operatively as tolerated  - Early ambulation and physical therapy as tolerated post-operatively - Adequate pain control especially in the setting of abdominal and thoracic surgery - Bronchodilators as needed for wheezing or shortness of breath - Oral steroids only if the patient appears to have component of COPD exacerbation, otherwise no routine utilization needed - Ideally recommend smoking cessation for >4 weeks before any intervention - Intraoperatively keep OR time to the shortest as possible - Post operatively may benefit from Nocturnal Bipap   Preoperative Risk Calculation: Postoperative respiratory failure (PRF) is considered as failure to wean from mechanical ventilation within 48 hours of surgery or unplanned intubation/reintubation postoperatively. The validated risk calculator provides a risk estimate of PRF and is anticipated to aid in surgical decision-making and informed patient consent.  However risk can be accepted given the potential benefit of this intervention and it is not prohibitive.  The features of this patient's history that contribute to the pulmonary risk assessment include:  Age  75 to 25 points: Low risk: 1.6% pulmonary complication rate  26 to 44 points: Intermediate risk: 13.3% pulmonary complication rate  45 to 123 points: High risk: 42.1% pulmonary complication rate  ARISCAT: Mazo et al. Anesthesiology 2014; 119:[id number]  Durel Salts, MD Pulmonary and Critical  Care Medicine [facility] 02/06/2023 2:28 PM Pager: see AMION  If no response to pager, please call critical care on call (see AMION) until 7pm After 7:00 pm call Elink

## 2023-02-08 NOTE — Telephone Encounter (Signed)
Faxed this encounter to Dr. Ranell Patrick

## 2023-02-13 ENCOUNTER — Encounter (HOSPITAL_COMMUNITY): Payer: Self-pay

## 2023-02-14 ENCOUNTER — Other Ambulatory Visit (HOSPITAL_COMMUNITY): Payer: Self-pay

## 2023-02-15 ENCOUNTER — Other Ambulatory Visit: Payer: Self-pay

## 2023-02-15 ENCOUNTER — Other Ambulatory Visit: Payer: Self-pay | Admitting: Physician Assistant

## 2023-02-15 ENCOUNTER — Other Ambulatory Visit: Payer: Self-pay | Admitting: Internal Medicine

## 2023-02-15 ENCOUNTER — Other Ambulatory Visit (HOSPITAL_BASED_OUTPATIENT_CLINIC_OR_DEPARTMENT_OTHER): Payer: Self-pay

## 2023-02-15 ENCOUNTER — Other Ambulatory Visit (HOSPITAL_COMMUNITY): Payer: Self-pay

## 2023-02-15 DIAGNOSIS — E039 Hypothyroidism, unspecified: Secondary | ICD-10-CM

## 2023-02-15 MED ORDER — CLOPIDOGREL BISULFATE 75 MG PO TABS
75.0000 mg | ORAL_TABLET | Freq: Every day | ORAL | 3 refills | Status: DC
  Filled 2023-02-15 – 2023-02-27 (×2): qty 90, 90d supply, fill #0
  Filled 2023-06-05: qty 90, 90d supply, fill #1
  Filled 2023-09-01: qty 90, 90d supply, fill #2
  Filled 2023-11-25: qty 90, 90d supply, fill #3

## 2023-02-15 MED ORDER — PREGABALIN 50 MG PO CAPS
50.0000 mg | ORAL_CAPSULE | Freq: Three times a day (TID) | ORAL | 2 refills | Status: DC
  Filled 2023-02-15 – 2023-03-24 (×5): qty 270, 90d supply, fill #0
  Filled 2023-06-19: qty 270, 90d supply, fill #1
  Filled 2023-09-27: qty 270, 90d supply, fill #2

## 2023-02-16 ENCOUNTER — Other Ambulatory Visit (HOSPITAL_COMMUNITY)
Admission: RE | Admit: 2023-02-16 | Discharge: 2023-02-16 | Disposition: A | Discharge status: Home / Self Care | Payer: Self-pay | Source: Ambulatory Visit | Attending: Oncology | Admitting: Oncology

## 2023-02-16 DIAGNOSIS — Z006 Encounter for examination for normal comparison and control in clinical research program: Secondary | ICD-10-CM | POA: Insufficient documentation

## 2023-02-17 ENCOUNTER — Other Ambulatory Visit (HOSPITAL_COMMUNITY): Payer: Self-pay

## 2023-02-18 ENCOUNTER — Other Ambulatory Visit: Payer: Self-pay

## 2023-02-22 DIAGNOSIS — Z0279 Encounter for issue of other medical certificate: Secondary | ICD-10-CM

## 2023-02-22 MED ORDER — LEVOTHYROXINE SODIUM 25 MCG PO TABS
25.0000 ug | ORAL_TABLET | Freq: Every day | ORAL | 1 refills | Status: DC
  Filled 2023-02-22 – 2023-05-04 (×2): qty 90, 90d supply, fill #0
  Filled 2023-07-31: qty 90, 90d supply, fill #1

## 2023-02-23 ENCOUNTER — Other Ambulatory Visit: Payer: Self-pay

## 2023-02-23 ENCOUNTER — Encounter: Payer: Self-pay | Admitting: Allergy and Immunology

## 2023-02-23 ENCOUNTER — Ambulatory Visit: Payer: Medicare PPO | Admitting: Allergy and Immunology

## 2023-02-23 ENCOUNTER — Other Ambulatory Visit (HOSPITAL_COMMUNITY): Payer: Self-pay

## 2023-02-23 VITALS — BP 142/74 | HR 67 | Temp 96.7°F | Resp 14 | Ht 63.78 in | Wt 154.5 lb

## 2023-02-23 DIAGNOSIS — K219 Gastro-esophageal reflux disease without esophagitis: Secondary | ICD-10-CM | POA: Diagnosis not present

## 2023-02-23 DIAGNOSIS — D839 Common variable immunodeficiency, unspecified: Secondary | ICD-10-CM | POA: Diagnosis not present

## 2023-02-23 DIAGNOSIS — K52832 Lymphocytic colitis: Secondary | ICD-10-CM | POA: Diagnosis not present

## 2023-02-23 DIAGNOSIS — Z8709 Personal history of other diseases of the respiratory system: Secondary | ICD-10-CM

## 2023-02-23 DIAGNOSIS — J479 Bronchiectasis, uncomplicated: Secondary | ICD-10-CM

## 2023-02-23 MED ORDER — LORATADINE 10 MG PO TABS
10.0000 mg | ORAL_TABLET | Freq: Every day | ORAL | 1 refills | Status: DC | PRN
  Filled 2023-02-23: qty 180, 180d supply, fill #0
  Filled 2023-07-13: qty 180, 180d supply, fill #1

## 2023-02-23 MED ORDER — IPRATROPIUM BROMIDE 0.06 % NA SOLN
2.0000 | Freq: Four times a day (QID) | NASAL | 1 refills | Status: DC | PRN
  Filled 2023-02-23: qty 45, 57d supply, fill #0

## 2023-02-23 MED ORDER — FAMOTIDINE 40 MG PO TABS
40.0000 mg | ORAL_TABLET | Freq: Every evening | ORAL | 1 refills | Status: DC
  Filled 2023-02-23: qty 90, 90d supply, fill #0
  Filled 2023-06-05: qty 90, 90d supply, fill #1

## 2023-02-23 MED ORDER — PANTOPRAZOLE SODIUM 40 MG PO TBEC
40.0000 mg | DELAYED_RELEASE_TABLET | Freq: Every morning | ORAL | 1 refills | Status: DC
  Filled 2023-02-23: qty 90, 90d supply, fill #0
  Filled 2023-06-05: qty 90, 90d supply, fill #1

## 2023-02-23 MED ORDER — MONTELUKAST SODIUM 10 MG PO TABS
10.0000 mg | ORAL_TABLET | Freq: Every evening | ORAL | 1 refills | Status: DC
  Filled 2023-02-23 – 2023-05-04 (×2): qty 90, 90d supply, fill #0
  Filled 2023-07-31: qty 90, 90d supply, fill #1

## 2023-02-23 MED ORDER — ALBUTEROL SULFATE HFA 108 (90 BASE) MCG/ACT IN AERS
2.0000 | INHALATION_SPRAY | RESPIRATORY_TRACT | 1 refills | Status: DC | PRN
  Filled 2023-02-23: qty 6.7, 17d supply, fill #0

## 2023-02-23 NOTE — Progress Notes (Signed)
 [place] [facility] [place] [facility] [place]   Follow-up Note  Referring Provider: Theophilus Andrews, Jonna* Primary Provider: Theophilus Andrews, Tully GRADE, MD Date of Office Visit: 02/23/2023  Subjective:   Beverly [name] (DOB: [birth year]) is a 78 y.o. female who [facility] in re-evaluation of the following:  HPI: Beverly [name] returns to this clinic in evaluation of CVID, bronchiectasis, history of Nocardia pneumonia, history of asthma, history of reflux, history of lymphocytic colitis.  I last saw her in this clinic 25 August 2022.  She has not had any infections and has not required an antibiotic.  She continues to receive immunoglobulin infusions.  She has had very little issues with her airway.  She does not use any short acting bronchodilator.  She is not really performing any type of exercise at this point in time.  Cold air does not appear to precipitate any respiratory tract symptoms.  She has had very little issues with her upper airway and she rarely uses any loratadine  or ipratropium.  She does continue on montelukast .  Her lymphocytic colitis is under excellent control while using oral budesonide  at 3 mg a day.  She informs me that she has had a lot of throat clearing and mucus in her throat.  She does have reflux and she is using Protonix  and famotidine  in combination and does not have any classic reflux symptoms.  She has received this years flu vaccine.  She is planning for a total knee replacement on the right sometime in early 2025.  Allergies as of 02/23/2023       Reactions   Other Other (See Comments)   Adhesive [tape] Rash   Blister    Codeine Rash        Medication List    acetaminophen  650 MG CR tablet Commonly known as: TYLENOL  SMARTSIG:1 Tablet(s) By Mouth   albuterol  108 (90 Base) MCG/ACT inhaler Commonly known as: Ventolin  HFA Inhale 2 puffs into the lungs every 4 (four) hours as needed for  wheezing or shortness of breath.   amoxicillin  500 MG tablet Commonly known as: AMOXIL    ARTIFICIAL TEAR OP Place 2 drops into both eyes in the morning and at bedtime.   atorvastatin  40 MG tablet Commonly known as: LIPITOR Take 1 tablet (40 mg total) by mouth daily.   Belbuca  750 MCG Film Generic drug: Buprenorphine  HCl Dissolve 750 mcg (1 film) by mouth in the cheek 2 (two) times daily.   budesonide  3 MG 24 hr capsule Commonly known as: ENTOCORT EC  Take 1 capsule (3 mg total) by mouth daily.   Calcium  Carbonate-Vitamin D  600-200 MG-UNIT Tabs Take 1 tablet by mouth 2 (two) times daily.   clopidogrel  75 MG tablet Commonly known as: PLAVIX  Take 1 tablet (75 mg total) by mouth daily.   Culturelle Adult Ult Balance Caps Take 1 capsule by mouth daily.   diclofenac  Sodium 1 % Gel Commonly known as: VOLTAREN  Apply 2 g topically 4 (four) times daily as needed.   dicyclomine  10 MG capsule Commonly known as: BENTYL  Take 1 capsule (10 mg total) by mouth 2 (two) times daily as needed for spasms.   docusate sodium  100 MG capsule Commonly known as: COLACE Take 1 capsule (100 mg total) by mouth in the morning and at bedtime.   EPINEPHrine  0.3 mg/0.3 mL Soaj injection Commonly known as: EPI-PEN Inject 0.3 mg into the muscle as needed for anaphylaxis.   famotidine  40 MG tablet  Commonly known as: Pepcid  Take 1 tablet (40 mg total) by mouth at bedtime.   FIBER PO Take 2 tablets by mouth in the morning and at bedtime.   fluorouracil  5 % cream Commonly known as: Efudex  Apply two times a day to areas on left leg for 3 weeks.   fluorouracil  5 % cream Commonly known as: Efudex  Apply two times a day to areas on hands for 2 weeks.   guaiFENesin  600 MG 12 hr tablet Commonly known as: MUCINEX  Take 600 mg by mouth 2 (two) times daily.   HYDROcodone -acetaminophen  7.5-325 MG tablet Commonly known as: NORCO Take 1 tablet by mouth 4 (four) times daily as needed for pain    HYDROcodone -acetaminophen  7.5-325 MG tablet Commonly known as: NORCO Take 1 tablet by mouth 4 (four) times daily as needed for pain   ipratropium 0.06 % nasal spray Commonly known as: ATROVENT  Place 2 sprays into both nostrils 4 (four) times daily as needed for rhinitis.   levocetirizine 5 MG tablet Commonly known as: XYZAL  Take 1 tablet (5 mg total) by mouth every evening. What changed: when to take this   levothyroxine  25 MCG tablet Commonly known as: SYNTHROID  Take 1 tablet (25 mcg total) by mouth daily.   loratadine  10 MG tablet Commonly known as: Claritin  Take 1 tablet (10 mg total) by mouth daily as needed for allergies (Can take an extra dose during flare ups.).   melatonin 5 MG Tabs Take 5 mg by mouth at bedtime.   methocarbamol  500 MG tablet Commonly known as: ROBAXIN  Take 1 tablet (500 mg total) by mouth 2 (two) times daily as needed.   montelukast  10 MG tablet Commonly known as: SINGULAIR  Take 1 tablet (10 mg total) by mouth at bedtime.   nitroGLYCERIN  0.4 MG SL tablet Commonly known as: Nitrostat  Place 1 tablet (0.4 mg total) under the tongue every 5 (five) minutes as needed.   omega-3 acid ethyl esters 1 g capsule Commonly known as: LOVAZA  Take 2 capsules (2 g total) by mouth 2 (two) times daily.   pantoprazole  40 MG tablet Commonly known as: PROTONIX  Take 1 tablet (40 mg total) by mouth in the morning.   polyethylene glycol 17 g packet Commonly known as: MIRALAX  / GLYCOLAX  Take 17 g by mouth daily.   pregabalin  50 MG capsule Commonly known as: Lyrica  Take 1 capsule (50 mg total) by mouth every 12 (twelve) hours stop gabapentin    pregabalin  50 MG capsule Commonly known as: Lyrica  Take 1 capsule (50 mg total) by mouth every 8 (eight) hours. STOP gabapentin .   psyllium 58.6 % packet Commonly known as: METAMUCIL Take 1 packet by mouth daily.   TUMS PO Take 1 tablet by mouth as needed (heartburn).   valACYclovir  500 MG tablet Commonly known  as: Valtrex  Take 1 tablet (500 mg total) by mouth daily.   venlafaxine  XR 75 MG 24 hr capsule Commonly known as: Effexor  XR Take 1 capsule (75 mg total) by mouth daily.   Xembify  10 GM/50ML Soln Generic drug: Immune Globulin  (Human)-klhw Inject 10 g into the skin once a week. 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   right sholder  BCC (basal cell carcinoma of skin) 05/09/2014   mid lower back   BCC (basal cell carcinoma of skin) 05/03/2017   right low back   BCC (basal cell carcinoma of skin) 05/03/2017   left upper back   BCC (basal cell carcinoma of skin) 07/05/2018   left mid back   BCC (basal cell carcinoma of skin) 05/20/1992   upper back   BCC (basal cell carcinoma of skin) 07/29/1993   left sholder medial   BCC (basal cell carcinoma of skin) 07/29/1993   left sholder lateral   BCC (basal cell carcinoma of skin) 07/29/1993   right thigh   BCC (basal cell carcinoma of skin) 07/29/1993   right sholder   BCC (basal cell carcinoma of skin) 12/22/1994   right mid forearm   BCC (basal cell carcinoma of skin) 12/22/1994   right upper forearm   BCC (basal cell carcinoma of skin) 12/22/1994   lower right upper arm   BCC (basal cell carcinoma of skin) 12/22/1994   right upper arm sholder   BCC (basal cell carcinoma of skin) 08/11/1995   left leg below knee   BCC (basal cell carcinoma of skin) 04/11/2002   mid back   BCC (basal cell carcinoma of skin) 12/10/2002   right center upper back   BCC (basal cell carcinoma of skin) 05/26/2005   right post sholder   BCC (basal cell carcinoma) 05/09/2014   left inner shin   BCC (basal cell carcinoma) 06/12/2014   left forearm   Blood transfusion without reported  diagnosis 11/06/21   Bowen's disease 10/07/1994   right post knee, right inner forearm/wrist   Bowen's disease 08/11/1995   left sholder   Bronchiectasis (HCC)    Cataract    left   Chronic pain    Common variable immunodeficiency [facility])    sees dr tiana ronco 02-11-2021 epic   COPD (chronic obstructive pulmonary disease) (HCC) 2022   Brochiectosis / tracheobrocheomalacia   Coronary artery disease    Depression    hx of   Diverticulosis of colon 1998   mild   DJD (degenerative joint disease)    Duodenal ulcer    h/o yrs ago   GERD (gastroesophageal reflux disease)    Heart murmur 1949   History of SCC (squamous cell carcinoma) of skin    Dr. Livingston   History of sinus bradycardia    HLD (hyperlipidemia)    hypertriglyceridemia   Hypertensive retinopathy of both eyes 11/18/2017   Hypothyroidism    IBS (irritable bowel syndrome)    Internal hemorrhoids 1998   Lymphocytic colitis    Mitral regurgitation    mild   Myocardial infarction [facility]) 2023   Cardiac CT 2023 - 2 cardiac stents placed   Nocardiosis    relased by infection disease dec 2022   Osteoarthritis    feet,shoulder,neck,back,hips and hands.   Pneumonia 2015   PONV (postoperative nausea and vomiting)    Rotator cuff tear, right 02/2019   infraspinatus and supraspinatus, and dislocation of long head of bicep tendon (Dr. Ernie)   SCC (squamous cell carcinoma) 11/26/2014   left hand, right hand, right deltoid mnay areas   SCC (squamous cell carcinoma) 05/03/2017   left cheek   Sleep apnea    uses a mouth guard nightly   Squamous cell carcinoma in situ (SCCIS) 07/05/2018   left hand   Tracheobronchomalacia    Trigger finger    left hand ring finger   Vitamin D  deficiency  mild    Past Surgical History:  Procedure Laterality Date   ABDOMINAL EXPOSURE N/A 02/02/2022   Procedure: ABDOMINAL EXPOSURE;  Surgeon: Gretta Lonni PARAS, MD;  Location: [facility] OR;  Service: Vascular;  Laterality: N/A;   ABDOMINAL  HYSTERECTOMY  1998   complete   BIOPSY  11/05/2021   Procedure: BIOPSY;  Surgeon: Eda Iha, MD;  Location[facility];  Service: Gastroenterology;;   BLEPHAROPLASTY Bilateral 01/2018   BROW LIFT Bilateral 10/10/2018   Procedure: DORY LIFT;  Surgeon: Lowery Estefana RAMAN, DO;  Location: [facility];  Service: Plastics;  Laterality: Bilateral;   BUNIONECTOMY     R 12/08, L 2004 (Dr. Teresita)   CARDIAC CATHETERIZATION     CARPAL TUNNEL RELEASE  1989   bilateral   CARPAL TUNNEL RELEASE Left 03/06/2021   Procedure: Left Revision Carpal Tunnel Release with hypothenar fat pad flap;  Surgeon: Alyse Agent, MD;  Location: [facility];  Service: Orthopedics;  Laterality: Left;  with local anesthesia   CATARACT EXTRACTION Bilateral Left in 08/2012, Right 04/2016   Dr.Hecker   CESAREAN SECTION     1981 and 1986   CHOLECYSTECTOMY  1992   laparoscopic   CLOSED REDUCTION NASAL FRACTURE N/A 08/29/2018   Procedure: CLOSED REDUCTION NASAL FRACTURE;  Surgeon: Lowery Estefana RAMAN, DO;  Location: [facility];  Service: Plastics;  Laterality: N/A;  1 hour, please   COLONOSCOPY  2006   COLONOSCOPY  01/2012   due again 01/2021; mild diverticulosis   CORONARY STENT INTERVENTION N/A 05/19/2021   Procedure: CORONARY STENT INTERVENTION;  Surgeon: Mady Lonni, MD;  Location: [facility];  Service: Cardiovascular;  Laterality: N/A;   CORONARY ULTRASOUND/IVUS N/A 05/19/2021   Procedure: Intravascular Ultrasound/IVUS;  Surgeon: Mady Lonni, MD;  Location: [facility];  Service: Cardiovascular;  Laterality: N/A;   COSMETIC SURGERY  2018   epidural steroid injection, back  02/2010   ESOPHAGOGASTRODUODENOSCOPY (EGD) WITH PROPOFOL  N/A 11/05/2021   Procedure: ESOPHAGOGASTRODUODENOSCOPY (EGD) WITH PROPOFOL ;  Surgeon: Eda Iha, MD;  Location: [facility];  Service: Gastroenterology;  Laterality: N/A;   EYE SURGERY  2017   HIP  SURGERY     right bursectomy x 3   HIP SURGERY Right 2000   torn cartilage, repaired   INGUINAL HERNIA REPAIR  10/2007   bilat   JOINT REPLACEMENT  04/11/2019   Right shoulder   LEFT HEART CATH AND CORONARY ANGIOGRAPHY N/A 05/19/2021   Procedure: LEFT HEART CATH AND CORONARY ANGIOGRAPHY;  Surgeon: Mady Lonni, MD;  Location: [facility];  Service: Cardiovascular;  Laterality: N/A;   LUMBAR FUSION  01/2022   NECK SURGERY  1989   c6-7 cervical laminectomy and diskecotmy   REVERSE SHOULDER ARTHROPLASTY Right 04/11/2019   Procedure: REVERSE SHOULDER ARTHROPLASTY;  Surgeon: Sharl Selinda Dover, MD;  Location: [facility] OR;  Service: Orthopedics;  Laterality: Right;  Regional Block   SHOULDER SURGERY Left 03/2003   left rotator cuff repair   SHOULDER SURGERY Left 03/14/2018   rotator cuff repair; Dr. Duwayne   SPINE SURGERY  1989 / lumbar   TONSILLECTOMY  age 13   TROCHANTERIC BURSA EXCISION Right 1978   TUBAL LIGATION  1986   UPPER GASTROINTESTINAL ENDOSCOPY     WISDOM TOOTH EXTRACTION     as teenager    Review of systems negative except as noted in HPI / PMHx or noted below:  Review of Systems  Constitutional: Negative.   HENT: Negative.    Eyes:  Negative.   Respiratory: Negative.    Cardiovascular: Negative.   Gastrointestinal: Negative.   Genitourinary: Negative.   Musculoskeletal: Negative.   Skin: Negative.   Neurological: Negative.   Endo/Heme/Allergies: Negative.   Psychiatric/Behavioral: Negative.       Objective:   Vitals:   02/23/23 1124  BP: (!) 142/74  Pulse: 67  Resp: 14  Temp: (!) 96.7 F (35.9 C)  SpO2: 99%   Height: 5' 3.78 (162 cm)  Weight: 154 lb 8 oz (70.1 kg)   Physical Exam Constitutional:      Appearance: She is not diaphoretic.  HENT:     Head: Normocephalic.     Right Ear: Tympanic membrane, ear canal and external ear normal.     Left Ear: Tympanic membrane, ear canal and external ear normal.     Nose: Nose normal. No mucosal  edema or rhinorrhea.     Mouth/Throat:     Pharynx: Uvula midline. No oropharyngeal exudate.  Eyes:     Conjunctiva/sclera: Conjunctivae normal.  Neck:     Thyroid : No thyromegaly.     Trachea: Trachea normal. No tracheal tenderness or tracheal deviation.  Cardiovascular:     Rate and Rhythm: Normal rate and regular rhythm.     Heart sounds: Normal heart sounds, S1 normal and S2 normal. No murmur heard. Pulmonary:     Effort: No respiratory distress.     Breath sounds: Normal breath sounds. No stridor. No wheezing or rales.  Lymphadenopathy:     Head:     Right side of head: No tonsillar adenopathy.     Left side of head: No tonsillar adenopathy.     Cervical: No cervical adenopathy.  Skin:    Findings: No erythema or rash.     Nails: There is no clubbing.  Neurological:     Mental Status: She is alert.     Diagnostics: Spirometry was performed and demonstrated an FEV1 of 2.13 at 107 % of predicted.  Results of blood tests obtained 24 February 2022 identifies IgG 899 Mg/DL  Assessment and Plan:   1. CVID (common variable immunodeficiency) (HCC)   2. Bronchiectasis without complication (HCC)   3. LPRD (laryngopharyngeal reflux disease)   4. Lymphocytic colitis   5. History of asthma    1.  Continue immunoglobulin infusions every week  2.  Continue to treat reflex / LPR:   A.  Pantoprazole  40 mg in AM  B.  Famotidine  40 mg in PM   C.  Replace throat clearing with swallowing/drinking maneuver  3.  Treat and prevent inflammation of upper airway:   A.  Montelukast  10 mg -1 tablet 1 time per day  4. If needed:  A. Loratadine  10 mg 1 tablet 1 time per day  B. Ipratropium 0.06% - 2 sprays each nostril every 6 hours (4x/day) C. Nasal saline D. Albuterol  HFA - 2 inhalations every 4-6 hours  5.  Continue budesonide  therapy for lymphocytic colitis  7.  Return to clinic in 6 months or earlier if problem  8. Influenza = Tamiflu . Covid = Paxlovid    Beverly [name] is really  doing very well with her immunoglobulin infusions and this has prevented her from having any significant infections or recrudescence of her nocardia infection and she will remain on this treatment for her CVID.  Her lymphocytic colitis appears to be doing quite well while using oral budesonide  under the care of her gastroenterologist.  Her reflux appears to be doing pretty well but she does have some  issues consistent with LPR and I have asked her to be cognizant of her throat clearing and replace it with a swallowing and drinking maneuver.  Assuming she does well with the plan noted above I will see her back in this clinic in 6 months or earlier if there is a problem.  On 1 additional note, I did mention that she should probably consider using a spinal anesthetic with her upcoming total knee replacement rather than a general anesthetic to avoid any irritation of her airway.  Beverly Denis, MD Allergy / Immunology [facility]

## 2023-02-23 NOTE — Patient Instructions (Addendum)
  1.  Continue immunoglobulin infusions every week  2.  Continue to treat reflex / LPR:   A.  Pantoprazole  40 mg in AM  B.  Famotidine  40 mg in PM   C.  Replace throat clearing with swallowing/drinking maneuver  3.  Treat and prevent inflammation of upper airway:   A.  Montelukast  10 mg -1 tablet 1 time per day  4. If needed:  A. Loratadine  10 mg 1 tablet 1 time per day  B. Ipratropium 0.06% - 2 sprays each nostril every 6 hours (4x/day) C. Nasal saline D. Albuterol  HFA - 2 inhalations every 4-6 hours  5.  Continue budesonide  therapy for lymphocytic colitis  7.  Return to clinic in 6 months or earlier if problem  8. Influenza = Tamiflu . Covid = Paxlovid

## 2023-02-24 ENCOUNTER — Encounter: Payer: Self-pay | Admitting: Allergy and Immunology

## 2023-02-24 ENCOUNTER — Other Ambulatory Visit: Payer: Self-pay

## 2023-02-24 ENCOUNTER — Other Ambulatory Visit (HOSPITAL_COMMUNITY): Payer: Self-pay

## 2023-02-26 ENCOUNTER — Telehealth: Payer: Self-pay | Admitting: Internal Medicine

## 2023-02-26 NOTE — Telephone Encounter (Signed)
 Copied from CRM [id number]. Topic: General - Other >> Feb 26, 2023 10:59 AM Carmell SAUNDERS wrote: Reason for CRM: Patient checking the status of release forms that were sent the first week of December for her Knee surgery from Dr. Kay. Will have them resend forms today. She is unable to schedule her appointment until they are received. Please f/u

## 2023-02-27 ENCOUNTER — Other Ambulatory Visit: Payer: Self-pay | Admitting: Internal Medicine

## 2023-02-27 ENCOUNTER — Other Ambulatory Visit: Payer: Self-pay | Admitting: Gastroenterology

## 2023-02-27 ENCOUNTER — Other Ambulatory Visit (HOSPITAL_COMMUNITY): Payer: Self-pay

## 2023-02-27 MED ORDER — LEVOCETIRIZINE DIHYDROCHLORIDE 5 MG PO TABS
5.0000 mg | ORAL_TABLET | Freq: Every evening | ORAL | 5 refills | Status: DC
  Filled 2023-02-27: qty 90, 90d supply, fill #0
  Filled 2023-07-13: qty 90, 90d supply, fill #1
  Filled 2023-10-11: qty 90, 90d supply, fill #2

## 2023-03-01 ENCOUNTER — Other Ambulatory Visit: Payer: Self-pay

## 2023-03-01 MED ORDER — BUDESONIDE 3 MG PO CPEP
3.0000 mg | ORAL_CAPSULE | Freq: Every day | ORAL | 3 refills | Status: DC
  Filled 2023-03-01: qty 90, 90d supply, fill #0

## 2023-03-01 NOTE — Telephone Encounter (Signed)
 Left message on machine for patient that the form has not yet been received.

## 2023-03-03 NOTE — Telephone Encounter (Signed)
 Left message on machine that the form has not yet been received.

## 2023-03-04 NOTE — H&P (Signed)
Patient's anticipated LOS is less than 2 midnights, meeting these requirements: - Younger than 86 - Lives within 1 hour of care - Has a competent adult at home to recover with post-op recover - NO history of  - Chronic pain requiring opiods  - Diabetes  - Coronary Artery Disease  - Heart failure  - Heart attack  - Stroke  - DVT/VTE  - Cardiac arrhythmia  - Respiratory Failure/COPD  - Renal failure  - Anemia  - Advanced Liver disease     Beverly [name] is an 78 y.o. female.    Chief Complaint: right knee pain  HPI: Pt is a 78 y.o. female complaining of right knee pain for multiple years. Pain had continually increased since the beginning. X-rays in the clinic show end-stage arthritic changes of the right knee. Pt has tried various conservative treatments which have failed to alleviate their symptoms, including injections and therapy. Various options are discussed with the patient. Risks, benefits and expectations were discussed with the patient. Patient understand the risks, benefits and expectations and wishes to proceed with surgery.   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   right sholder   BCC (basal cell carcinoma of skin) 05/09/2014   mid lower back   BCC (basal cell carcinoma of skin) 05/03/2017   right low back   BCC (basal cell carcinoma of skin) 05/03/2017   left upper back   BCC (basal cell carcinoma of skin) 07/05/2018   left mid back   BCC (basal cell carcinoma of skin) 05/20/1992   upper back   BCC (basal cell carcinoma of skin) 07/29/1993   left sholder medial   BCC (basal cell carcinoma of skin) 07/29/1993   left sholder lateral   BCC  (basal cell carcinoma of skin) 07/29/1993   right thigh   BCC (basal cell carcinoma of skin) 07/29/1993   right sholder   BCC (basal cell carcinoma of skin) 12/22/1994   right mid forearm   BCC (basal cell carcinoma of skin) 12/22/1994   right upper forearm   BCC (basal cell carcinoma of skin) 12/22/1994   lower right upper arm   BCC (basal cell carcinoma of skin) 12/22/1994   right upper arm sholder   BCC (basal cell carcinoma of skin) 08/11/1995   left leg below knee   BCC (basal cell carcinoma of skin) 04/11/2002   mid back   BCC (basal cell carcinoma of skin) 12/10/2002   right center upper back   BCC (basal cell carcinoma of skin) 05/26/2005   right post sholder   BCC (basal cell carcinoma) 05/09/2014   left inner shin   BCC (basal cell carcinoma) 06/12/2014   left forearm   Blood transfusion without reported diagnosis 11/06/21   Bowen's disease 10/07/1994   right post knee, right inner forearm/wrist   Bowen's disease 08/11/1995   left sholder   Bronchiectasis (HCC)    Cataract    left   Chronic pain    Common variable immunodeficiency [facility])    sees dr Ellin Goodie 02-11-2021 epic   COPD (chronic obstructive pulmonary disease) (HCC) 2022  Brochiectosis / tracheobrocheomalacia   Coronary artery disease    Depression    hx of   Diverticulosis of colon 1998   mild   DJD (degenerative joint disease)    Duodenal ulcer    h/o yrs ago   GERD (gastroesophageal reflux disease)    Heart murmur 1949   History of SCC (squamous cell carcinoma) of skin    Dr. Jorja Loa   History of sinus bradycardia    HLD (hyperlipidemia)    hypertriglyceridemia   Hypertensive retinopathy of both eyes 11/18/2017   Hypothyroidism    IBS (irritable bowel syndrome)    Internal hemorrhoids 1998   Lymphocytic colitis    Mitral regurgitation    mild   Myocardial infarction [facility]) 2023   Cardiac CT 2023 - 2 cardiac stents placed   Nocardiosis    relased by infection disease dec 2022    Osteoarthritis    feet,shoulder,neck,back,hips and hands.   Pneumonia 2015   PONV (postoperative nausea and vomiting)    Rotator cuff tear, right 02/2019   infraspinatus and supraspinatus, and dislocation of long head of bicep tendon (Dr. Charlann Boxer)   SCC (squamous cell carcinoma) 11/26/2014   left hand, right hand, right deltoid mnay areas   SCC (squamous cell carcinoma) 05/03/2017   left cheek   Sleep apnea    uses a mouth guard nightly   Squamous cell carcinoma in situ (SCCIS) 07/05/2018   left hand   Tracheobronchomalacia    Trigger finger    left hand ring finger   Vitamin D deficiency    mild    PSH: Past Surgical History:  Procedure Laterality Date   ABDOMINAL EXPOSURE N/A 02/02/2022   Procedure: ABDOMINAL EXPOSURE;  Surgeon: Cephus Shelling, MD;  Location: [facility] OR;  Service: Vascular;  Laterality: N/A;   ABDOMINAL HYSTERECTOMY  1998   complete   BIOPSY  11/05/2021   Procedure: BIOPSY;  Surgeon: Tressia Danas, MD;  Location: [facility] ENDOSCOPY;  Service: Gastroenterology;;   BLEPHAROPLASTY Bilateral 01/2018   BROW LIFT Bilateral 10/10/2018   Procedure: Coralee Rud LIFT;  Surgeon: Peggye Form, DO;  Location: [facility];  Service: Plastics;  Laterality: Bilateral;   BUNIONECTOMY     R 12/08, L 2004 (Dr. Wynelle Cleveland)   CARDIAC CATHETERIZATION     CARPAL TUNNEL RELEASE  1989   bilateral   CARPAL TUNNEL RELEASE Left 03/06/2021   Procedure: Left Revision Carpal Tunnel Release with hypothenar fat pad flap;  Surgeon: Gomez Cleverly, MD;  Location: [facility];  Service: Orthopedics;  Laterality: Left;  with local anesthesia   CATARACT EXTRACTION Bilateral Left in 08/2012, Right 04/2016   Dr.Hecker   CESAREAN SECTION     1981 and 1986   CHOLECYSTECTOMY  1992   laparoscopic   CLOSED REDUCTION NASAL FRACTURE N/A 08/29/2018   Procedure: CLOSED REDUCTION NASAL FRACTURE;  Surgeon: Peggye Form, DO;  Location: [facility];  Service:  Plastics;  Laterality: N/A;  1 hour, please   COLONOSCOPY  2006   COLONOSCOPY  01/2012   due again 01/2021; mild diverticulosis   CORONARY STENT INTERVENTION N/A 05/19/2021   Procedure: CORONARY STENT INTERVENTION;  Surgeon: Yvonne Kendall, MD;  Location: [facility] INVASIVE CV LAB;  Service: Cardiovascular;  Laterality: N/A;   CORONARY ULTRASOUND/IVUS N/A 05/19/2021   Procedure: Intravascular Ultrasound/IVUS;  Surgeon: Yvonne Kendall, MD;  Location: [facility] INVASIVE CV LAB;  Service: Cardiovascular;  Laterality: N/A;   COSMETIC SURGERY  2018   epidural steroid injection, back  02/2010   ESOPHAGOGASTRODUODENOSCOPY (EGD) WITH PROPOFOL N/A 11/05/2021   Procedure: ESOPHAGOGASTRODUODENOSCOPY (EGD) WITH PROPOFOL;  Surgeon: Tressia Danas, MD;  Location: [facility] ENDOSCOPY;  Service: Gastroenterology;  Laterality: N/A;   EYE SURGERY  2017   HIP SURGERY     right bursectomy x 3   HIP SURGERY Right 2000   torn cartilage, repaired   INGUINAL HERNIA REPAIR  10/2007   bilat   JOINT REPLACEMENT  04/11/2019   Right shoulder   LEFT HEART CATH AND CORONARY ANGIOGRAPHY N/A 05/19/2021   Procedure: LEFT HEART CATH AND CORONARY ANGIOGRAPHY;  Surgeon: Yvonne Kendall, MD;  Location: [facility] INVASIVE CV LAB;  Service: Cardiovascular;  Laterality: N/A;   LUMBAR FUSION  01/2022   NECK SURGERY  1989   c6-7 cervical laminectomy and diskecotmy   REVERSE SHOULDER ARTHROPLASTY Right 04/11/2019   Procedure: REVERSE SHOULDER ARTHROPLASTY;  Surgeon: Yolonda Kida, MD;  Location: [facility] OR;  Service: Orthopedics;  Laterality: Right;  Regional Block   SHOULDER SURGERY Left 03/2003   left rotator cuff repair   SHOULDER SURGERY Left 03/14/2018   rotator cuff repair; Dr. Shelle Iron   SPINE SURGERY  1989 / lumbar   TONSILLECTOMY  age 22   TROCHANTERIC BURSA EXCISION Right 1978   TUBAL LIGATION  1986   UPPER GASTROINTESTINAL ENDOSCOPY     WISDOM TOOTH EXTRACTION     as teenager    Social History:  reports that she has never  smoked. She has been exposed to tobacco smoke. She has never used smokeless tobacco. She reports current alcohol use of about 3.0 standard drinks of alcohol per week. She reports that she does not use drugs. BMI: Estimated body mass index is 26.7 kg/m as calculated from the following:   Height as of 02/23/23: 5' 3.78" (1.62 m).   Weight as of 02/23/23: 70.1 kg.  Lab Results  Component Value Date   ALBUMIN 4.5 01/26/2023   Diabetes: Patient does not have a diagnosis of diabetes.     Smoking Status:   reports that she has never smoked. She has been exposed to tobacco smoke. She has never used smokeless tobacco.    Allergies:  Allergies  Allergen Reactions   Other Other (See Comments)   Adhesive [Tape] Rash    Blister    Codeine Rash    Medications: No current facility-administered medications for this encounter.   Current Outpatient Medications  Medication Sig Dispense Refill   acetaminophen (TYLENOL) 650 MG CR tablet SMARTSIG:1 Tablet(s) By Mouth     albuterol (VENTOLIN HFA) 108 (90 Base) MCG/ACT inhaler Inhale 2 puffs into the lungs every 4 (four) hours as needed for wheezing or shortness of breath. 6.7 g 1   amoxicillin (AMOXIL) 500 MG tablet      ARTIFICIAL TEAR OP Place 2 drops into both eyes in the morning and at bedtime.     atorvastatin (LIPITOR) 40 MG tablet Take 1 tablet (40 mg total) by mouth daily. 90 tablet 0   budesonide (ENTOCORT EC) 3 MG 24 hr capsule Take 1 capsule (3 mg total) by mouth daily. 90 capsule 3   Buprenorphine HCl (BELBUCA) 750 MCG FILM Dissolve 750 mcg (1 film) by mouth in the cheek 2 (two) times daily. 60 each 2   Calcium Carbonate Antacid (TUMS PO) Take 1 tablet by mouth as needed (heartburn).     Calcium Carbonate-Vitamin D 600-200 MG-UNIT TABS Take 1 tablet by mouth 2 (two) times daily.     clopidogrel (PLAVIX) 75 MG tablet Take  1 tablet (75 mg total) by mouth daily. 90 tablet 3   diclofenac Sodium (VOLTAREN) 1 % GEL Apply 2 g topically 4 (four)  times daily as needed. 240 g 2   dicyclomine (BENTYL) 10 MG capsule Take 1 capsule (10 mg total) by mouth 2 (two) times daily as needed for spasms. 60 capsule 3   docusate sodium (COLACE) 100 MG capsule Take 1 capsule (100 mg total) by mouth in the morning and at bedtime. 30 capsule 11   EPINEPHrine 0.3 mg/0.3 mL IJ SOAJ injection Inject 0.3 mg into the muscle as needed for anaphylaxis.     famotidine (PEPCID) 40 MG tablet Take 1 tablet (40 mg total) by mouth at bedtime. 90 tablet 1   FIBER PO Take 2 tablets by mouth in the morning and at bedtime.     fluorouracil (EFUDEX) 5 % cream Apply two times a day to areas on left leg for 3 weeks. 40 g 0   fluorouracil (EFUDEX) 5 % cream Apply two times a day to areas on hands for 2 weeks. 40 g 0   guaiFENesin (MUCINEX) 600 MG 12 hr tablet Take 600 mg by mouth 2 (two) times daily.     HYDROcodone-acetaminophen (NORCO) 7.5-325 MG tablet Take 1 tablet by mouth 4 (four) times daily as needed for pain 120 tablet 0   HYDROcodone-acetaminophen (NORCO) 7.5-325 MG tablet Take 1 tablet by mouth 4 (four) times daily as needed for pain 120 tablet 0   ipratropium (ATROVENT) 0.06 % nasal spray Place 2 sprays into both nostrils 4 (four) times daily as needed for rhinitis. 45 mL 1   Lactobacillus-Inulin (CULTURELLE ADULT ULT BALANCE) CAPS Take 1 capsule by mouth daily.     levocetirizine (XYZAL) 5 MG tablet Take 1 tablet (5 mg total) by mouth every evening. 90 tablet 5   levothyroxine (SYNTHROID) 25 MCG tablet Take 1 tablet (25 mcg total) by mouth daily. 90 tablet 1   loratadine (CLARITIN) 10 MG tablet Take 1 tablet (10 mg total) by mouth daily as needed for allergies (Can take an extra dose during flare ups.). 180 tablet 1   melatonin 5 MG TABS Take 5 mg by mouth at bedtime.     methocarbamol (ROBAXIN) 500 MG tablet Take 1 tablet (500 mg total) by mouth 2 (two) times daily as needed. 120 tablet 2   montelukast (SINGULAIR) 10 MG tablet Take 1 tablet (10 mg total) by mouth  at bedtime. 90 tablet 1   nitroGLYCERIN (NITROSTAT) 0.4 MG SL tablet Place 1 tablet (0.4 mg total) under the tongue every 5 (five) minutes as needed. 25 tablet 10   omega-3 acid ethyl esters (LOVAZA) 1 g capsule Take 2 capsules (2 g total) by mouth 2 (two) times daily. 360 capsule 0   pantoprazole (PROTONIX) 40 MG tablet Take 1 tablet (40 mg total) by mouth in the morning. 90 tablet 1   polyethylene glycol (MIRALAX / GLYCOLAX) 17 g packet Take 17 g by mouth daily.     pregabalin (LYRICA) 50 MG capsule Take 1 capsule (50 mg total) by mouth every 12 (twelve) hours stop gabapentin 60 capsule 2   pregabalin (LYRICA) 50 MG capsule Take 1 capsule (50 mg total) by mouth every 8 (eight) hours. STOP gabapentin. 270 capsule 2   psyllium (METAMUCIL) 58.6 % packet Take 1 packet by mouth daily.     valACYclovir (VALTREX) 500 MG tablet Take 1 tablet (500 mg total) by mouth daily. 90 tablet 1   venlafaxine XR (  EFFEXOR XR) 75 MG 24 hr capsule Take 1 capsule (75 mg total) by mouth daily. 90 capsule 2   XEMBIFY 10 GM/50ML SOLN Inject 10 g into the skin once a week. Wednesday      No results found. However, due to the size of the patient record, not all encounters were searched. Please check Results Review for a complete set of results. No results found.  ROS: Pain with rom of the right lower extremity  Physical Exam: Alert and oriented 78 y.o. female in no acute distress Cranial nerves 2-12 intact Cervical spine: full rom with no tenderness, nv intact distally Chest: active breath sounds bilaterally, no wheeze rhonchi or rales Heart: regular rate and rhythm, no murmur Abd: non tender non distended with active bowel sounds Hip is stable with rom  Right knee painful rom with crepitus Nv intact distally No rashes or edema distally Antalgic gait  Assessment/Plan Assessment: right knee end stage osteoarthritis  Plan:  Patient will undergo a right total knee by Dr. Ranell Patrick at [place] Risks benefits and  expectations were discussed with the patient. Patient understand risks, benefits and expectations and wishes to proceed. Preoperative templating of the joint replacement has been completed, documented, and submitted to the Operating Room personnel in order to optimize intra-operative equipment management.   Alphonsa Overall PA-C, MPAS [facility] is now [employer] [address], [place], [place] [address] Phone: [phone] [email or web address] Facebook  [employer]

## 2023-03-05 ENCOUNTER — Other Ambulatory Visit (HOSPITAL_COMMUNITY): Payer: Self-pay

## 2023-03-05 ENCOUNTER — Other Ambulatory Visit: Payer: Self-pay

## 2023-03-05 MED ORDER — BELBUCA 750 MCG BU FILM
750.0000 ug | ORAL_FILM | Freq: Two times a day (BID) | BUCCAL | 2 refills | Status: DC
  Filled 2023-03-17: qty 60, 30d supply, fill #0
  Filled 2023-04-18: qty 60, 30d supply, fill #1

## 2023-03-08 ENCOUNTER — Other Ambulatory Visit (HOSPITAL_COMMUNITY): Payer: Self-pay

## 2023-03-09 NOTE — Patient Instructions (Signed)
SURGICAL WAITING ROOM VISITATION  Patients having surgery or a procedure may have no more than 2 support people in the waiting area - these visitors may rotate.    Children under the age of 102 must have an adult with them who is not the patient.  Due to an increase in RSV and influenza rates and associated hospitalizations, children ages 10 and under may not visit patients in [facility] hospitals.  Visitors with respiratory illnesses are discouraged from visiting and should remain at home.  If the patient needs to stay at the hospital during part of their recovery, the visitor guidelines for inpatient rooms apply. Pre-op nurse will coordinate an appropriate time for 1 support person to accompany patient in pre-op.  This support person may not rotate.    Please refer to the [facility] website for the visitor guidelines for Inpatients (after your surgery is over and you are in a regular room).       Your procedure is scheduled on: 03/12/23   Report to [facility] Main Entrance    Report to admitting at  11:45 AM   Call this number if you have problems the morning of surgery [phone]   Do not eat food :After Midnight.   After Midnight you may have the following liquids until 11:15 AM DAY OF SURGERY  Water Non-Citrus Juices (without pulp, NO RED-Apple, White grape, White cranberry) Black Coffee (NO MILK/CREAM OR CREAMERS, sugar ok)  Clear Tea (NO MILK/CREAM OR CREAMERS, sugar ok) regular and decaf                             Plain Jell-O (NO RED)                                           Fruit ices (not with fruit pulp, NO RED)                                     Popsicles (NO RED)                                                               Sports drinks like Gatorade (NO RED)                The day of surgery:  Drink ONE (1) Pre-Surgery Clear Ensure at 11:15 AM the morning of surgery. Drink in one sitting. Do not sip.  This drink was given to you during your  hospital  pre-op appointment visit. Nothing else to drink after completing the  Pre-Surgery Clear Ensure.       Oral Hygiene is also important to reduce your risk of infection.                                    Remember - BRUSH YOUR TEETH THE MORNING OF SURGERY WITH YOUR REGULAR TOOTHPASTE   Stop all vitamins and herbal supplements 7 days before surgery.   Take these medicines the morning  of surgery with A SIP OF WATER: tylenol if needed, Atorvastatin, Entocort EC, Belbuca if needed              Famotidine, Xyzel, Synthroid, Loratadine, Singulair, Pantoprazole, Lyrica, Effexor inhaler and nasal spry if needed.  Bring CPAP mask and tubing day of surgery.                              You may not have any metal on your body including hair pins, jewelry, and body piercing             Do not wear make-up, lotions, powders, perfumes/cologne, or deodorant  Do not wear nail polish including gel and S&S, artificial/acrylic nails, or any other type of covering on natural nails including finger and toenails. If you have artificial nails, gel coating, etc. that needs to be removed by a nail salon please have this removed prior to surgery or surgery may need to be canceled/ delayed if the surgeon/ anesthesia feels like they are unable to be safely monitored.   Do not shave  48 hours prior to surgery.    Do not bring valuables to the hospital. [facility] IS NOT             RESPONSIBLE   FOR VALUABLES.   Contacts, glasses, dentures or bridgework may not be worn into surgery.   Bring small overnight bag day of surgery.   DO NOT BRING YOUR HOME MEDICATIONS TO THE HOSPITAL. PHARMACY WILL DISPENSE MEDICATIONS LISTED ON YOUR MEDICATION LIST TO YOU DURING YOUR ADMISSION IN THE HOSPITAL!    Patients discharged on the day of surgery will not be allowed to drive home.  Someone NEEDS to stay with you for the first 24 hours after anesthesia.   Special Instructions: Bring a copy of your healthcare power  of attorney and living will documents the day of surgery if you haven't scanned them before.              Please read over the following fact sheets you were given: IF YOU HAVE QUESTIONS ABOUT YOUR PRE-OP INSTRUCTIONS PLEASE CALL [phone] [facility]   If you received a COVID test during your pre-op visit  it is requested that you wear a mask when out in public, stay away from anyone that may not be feeling well and notify your surgeon if you develop symptoms. If you test positive for Covid or have been in contact with anyone that has tested positive in the last 10 days please notify you surgeon.      Pre-operative 5 CHG Bath Instructions   You can play a key role in reducing the risk of infection after surgery. Your skin needs to be as free of germs as possible. You can reduce the number of germs on your skin by washing with CHG (chlorhexidine gluconate) soap before surgery. CHG is an antiseptic soap that kills germs and continues to kill germs even after washing.   DO NOT use if you have an allergy to chlorhexidine/CHG or antibacterial soaps. If your skin becomes reddened or irritated, stop using the CHG and notify one of our RNs at [phone].   Please shower with the CHG soap starting 4 days before surgery using the following schedule:     Please keep in mind the following:  DO NOT shave, including legs and underarms, starting the day of your first shower.   You may shave your face at any  point before/day of surgery.  Place clean sheets on your bed the day you start using CHG soap. Use a clean washcloth (not used since being washed) for each shower. DO NOT sleep with pets once you start using the CHG.   CHG Shower Instructions:  If you choose to wash your hair and private area, wash first with your normal shampoo/soap.  After you use shampoo/soap, rinse your hair and body thoroughly to remove shampoo/soap residue.  Turn the water OFF and apply about 3 tablespoons (45 ml) of CHG soap  to a CLEAN washcloth.  Apply CHG soap ONLY FROM YOUR NECK DOWN TO YOUR TOES (washing for 3-5 minutes)  DO NOT use CHG soap on face, private areas, open wounds, or sores.  Pay special attention to the area where your surgery is being performed.  If you are having back surgery, having someone wash your back for you may be helpful. Wait 2 minutes after CHG soap is applied, then you may rinse off the CHG soap.  Pat dry with a clean towel  Put on clean clothes/pajamas   If you choose to wear lotion, please use ONLY the CHG-compatible lotions on the back of this paper.     Additional instructions for the day of surgery: DO NOT APPLY any lotions, deodorants, cologne, or perfumes.   Put on clean/comfortable clothes.  Brush your teeth.  Ask your nurse before applying any prescription medications to the skin.      CHG Compatible Lotions   Aveeno Moisturizing lotion  Cetaphil Moisturizing Cream  Cetaphil Moisturizing Lotion  Clairol Herbal Essence Moisturizing Lotion, Dry Skin  Clairol Herbal Essence Moisturizing Lotion, Extra Dry Skin  Clairol Herbal Essence Moisturizing Lotion, Normal Skin  Curel Age Defying Therapeutic Moisturizing Lotion with Alpha Hydroxy  Curel Extreme Care Body Lotion  Curel Soothing Hands Moisturizing Hand Lotion  Curel Therapeutic Moisturizing Cream, Fragrance-Free  Curel Therapeutic Moisturizing Lotion, Fragrance-Free  Curel Therapeutic Moisturizing Lotion, Original Formula  Eucerin Daily Replenishing Lotion  Eucerin Dry Skin Therapy Plus Alpha Hydroxy Crme  Eucerin Dry Skin Therapy Plus Alpha Hydroxy Lotion  Eucerin Original Crme  Eucerin Original Lotion  Eucerin Plus Crme Eucerin Plus Lotion  Eucerin TriLipid Replenishing Lotion  Keri Anti-Bacterial Hand Lotion  Keri Deep Conditioning Original Lotion Dry Skin Formula Softly Scented  Keri Deep Conditioning Original Lotion, Fragrance Free Sensitive Skin Formula  Keri Lotion Fast Absorbing Fragrance Free  Sensitive Skin Formula  Keri Lotion Fast Absorbing Softly Scented Dry Skin Formula  Keri Original Lotion  Keri Skin Renewal Lotion Keri Silky Smooth Lotion  Keri Silky Smooth Sensitive Skin Lotion  Nivea Body Creamy Conditioning Oil  Nivea Body Extra Enriched Lotion  Nivea Body Original Lotion  Nivea Body Sheer Moisturizing Lotion Nivea Crme  Nivea Skin Firming Lotion  NutraDerm 30 Skin Lotion  NutraDerm Skin Lotion  NutraDerm Therapeutic Skin Cream  NutraDerm Therapeutic Skin Lotion  ProShield Protective Hand Cream    Incentive Spirometer (Watch this video at home: [email or web address])  An incentive spirometer is a tool that can help keep your lungs clear and active. This tool measures how well you are filling your lungs with each breath. Taking long deep breaths may help reverse or decrease the chance of developing breathing (pulmonary) problems (especially infection) following: A long period of time when you are unable to move or be active. BEFORE THE PROCEDURE  If the spirometer includes an indicator to show your best effort, your nurse or respiratory therapist will set it to a desired  goal. If possible, sit up straight or lean slightly forward. Try not to slouch. Hold the incentive spirometer in an upright position. INSTRUCTIONS FOR USE  Sit on the edge of your bed if possible, or sit up as far as you can in bed or on a chair. Hold the incentive spirometer in an upright position. Breathe out normally. Place the mouthpiece in your mouth and seal your lips tightly around it. Breathe in slowly and as deeply as possible, raising the piston or the ball toward the top of the column. Hold your breath for 3-5 seconds or for as long as possible. Allow the piston or ball to fall to the bottom of the column. Remove the mouthpiece from your mouth and breathe out normally. Rest for a few seconds and repeat Steps 1 through 7 at least 10 times every 1-2 hours when you  are awake. Take your time and take a few normal breaths between deep breaths. The spirometer may include an indicator to show your best effort. Use the indicator as a goal to work toward during each repetition. After each set of 10 deep breaths, practice coughing to be sure your lungs are clear. If you have an incision (the cut made at the time of surgery), support your incision when coughing by placing a pillow or rolled up towels firmly against it. Once you are able to get out of bed, walk around indoors and cough well. You may stop using the incentive spirometer when instructed by your caregiver.  RISKS AND COMPLICATIONS Take your time so you do not get dizzy or light-headed. If you are in pain, you may need to take or ask for pain medication before doing incentive spirometry. It is harder to take a deep breath if you are having pain. AFTER USE Rest and breathe slowly and easily. It can be helpful to keep track of a log of your progress. Your caregiver can provide you with a simple table to help with this. If you are using the spirometer at home, follow these instructions: SEEK MEDICAL CARE IF:  You are having difficultly using the spirometer. You have trouble using the spirometer as often as instructed. Your pain medication is not giving enough relief while using the spirometer. You develop fever of 100.5 F (38.1 C) or higher. SEEK IMMEDIATE MEDICAL CARE IF:  You cough up bloody sputum that had not been present before. You develop fever of 102 F (38.9 C) or greater. You develop worsening pain at or near the incision site. MAKE SURE YOU:  Understand these instructions. Will watch your condition. Will get help right away if you are not doing well or get worse. Document Released: 06/15/2006 Document Revised: 04/27/2011 Document Reviewed: 08/16/2006 [facility] Patient Information 2014 [place], [place].

## 2023-03-09 NOTE — Progress Notes (Signed)
COVID Vaccine received:  []  No [x]  Yes Date of any COVID positive Test in last 90 days: no PCP - Estela [name] MD Cardiologist - Charlton Haws MD  Chest x-ray -  EKG -  01/26/23 Epic Stress Test - 06/24/19 Epic ECHO - 06/23/19 Epic Cardiac Cath - 05/19/21 Epic  Cardiac clearance 01/26/23 -Jacolyn Reedy PA-C  Bowel Prep - [x]  No  []   Yes ______  Pacemaker / ICD device [x]  No []  Yes   Spinal Cord Stimulator:[x]  No []  Yes       History of Sleep Apnea? []  No [x]  Yes   CPAP used?- [x]  No []  Yes    Does the patient monitor blood sugar?          [x]  No []  Yes  []  N/A  Patient has: [x]  NO Hx DM   []  Pre-DM                 []  DM1  []   DM2 Does patient have a [employer] or Dexacom? []  No []  Yes   Fasting Blood Sugar Ranges-  Checks Blood Sugar _____ times a day  GLP1 agonist / usual dose - no GLP1 instructions:  SGLT-2 inhibitors / usual dose - no SGLT-2 instructions:   Blood Thinner / Instructions:Plavix Stopped 03/05/23 Aspirin Instructions:no  Comments:   Activity level: Patient is unable to climb a flight of stairs without difficulty; [x]  No CP  [x]  No SOB, but would have __Knee pain_   Patient can  perform ADLs without assistance.   Anesthesia review: Aortic atheroscleosis, OSA, CAD-stents 2023, immuno deficiency, tracheobronchomalacia  Patient denies shortness of breath, fever, cough and chest pain at PAT appointment.  Patient verbalized understanding and agreement to the Pre-Surgical Instructions that were given to them at this PAT appointment. Patient was also educated of the need to review these PAT instructions again prior to his/her surgery.I reviewed the appropriate phone numbers to call if they have any and questions or concerns.

## 2023-03-10 ENCOUNTER — Encounter: Payer: Self-pay | Admitting: Cardiovascular Disease

## 2023-03-10 ENCOUNTER — Encounter: Payer: Self-pay | Admitting: Allergy and Immunology

## 2023-03-10 ENCOUNTER — Encounter: Payer: Self-pay | Admitting: Internal Medicine

## 2023-03-10 DIAGNOSIS — C44321 Squamous cell carcinoma of skin of nose: Secondary | ICD-10-CM | POA: Diagnosis not present

## 2023-03-10 NOTE — Telephone Encounter (Signed)
Addressed my chart in previous message.

## 2023-03-11 ENCOUNTER — Encounter (HOSPITAL_COMMUNITY): Payer: Self-pay

## 2023-03-11 ENCOUNTER — Other Ambulatory Visit: Payer: Self-pay

## 2023-03-11 ENCOUNTER — Encounter (HOSPITAL_COMMUNITY)
Admission: RE | Admit: 2023-03-11 | Discharge: 2023-03-11 | Disposition: A | Discharge status: Home / Self Care | Payer: Medicare PPO | Source: Ambulatory Visit | Attending: Orthopedic Surgery | Admitting: Orthopedic Surgery

## 2023-03-11 VITALS — BP 169/78 | HR 65 | Temp 98.0°F | Resp 16 | Ht 63.5 in | Wt 154.0 lb

## 2023-03-11 DIAGNOSIS — M1711 Unilateral primary osteoarthritis, right knee: Secondary | ICD-10-CM | POA: Insufficient documentation

## 2023-03-11 DIAGNOSIS — I2510 Atherosclerotic heart disease of native coronary artery without angina pectoris: Secondary | ICD-10-CM | POA: Diagnosis not present

## 2023-03-11 DIAGNOSIS — G4730 Sleep apnea, unspecified: Secondary | ICD-10-CM | POA: Diagnosis not present

## 2023-03-11 DIAGNOSIS — I252 Old myocardial infarction: Secondary | ICD-10-CM | POA: Insufficient documentation

## 2023-03-11 DIAGNOSIS — Z01818 Encounter for other preprocedural examination: Secondary | ICD-10-CM | POA: Insufficient documentation

## 2023-03-11 HISTORY — DX: Cardiac arrhythmia, unspecified: I49.9

## 2023-03-11 LAB — SURGICAL PCR SCREEN
MRSA, PCR: NEGATIVE
Staphylococcus aureus: NEGATIVE

## 2023-03-11 LAB — CBC
HCT: 39.4 % (ref 36.0–46.0)
Hemoglobin: 12.9 g/dL (ref 12.0–15.0)
MCH: 32.5 pg (ref 26.0–34.0)
MCHC: 32.7 g/dL (ref 30.0–36.0)
MCV: 99.2 fL (ref 80.0–100.0)
Platelets: 153 10*3/uL (ref 150–400)
RBC: 3.97 MIL/uL (ref 3.87–5.11)
RDW: 14.4 % (ref 11.5–15.5)
WBC: 6.5 10*3/uL (ref 4.0–10.5)
nRBC: 0 % (ref 0.0–0.2)

## 2023-03-11 LAB — BASIC METABOLIC PANEL
Anion gap: 9 (ref 5–15)
BUN: 11 mg/dL (ref 8–23)
CO2: 24 mmol/L (ref 22–32)
Calcium: 9.3 mg/dL (ref 8.9–10.3)
Chloride: 108 mmol/L (ref 98–111)
Creatinine, Ser: 0.52 mg/dL (ref 0.44–1.00)
GFR, Estimated: >60 mL/min (ref >60)
Glucose, Bld: 93 mg/dL (ref 70–99)
Potassium: 4 mmol/L (ref 3.5–5.1)
Sodium: 141 mmol/L (ref 135–145)

## 2023-03-11 NOTE — Progress Notes (Signed)
Anesthesia Chart Review   Case: [id number] Date/Time: 03/12/23 1400   Procedure: TOTAL KNEE ARTHROPLASTY (Right: Knee) - general anesthesia , please put shoulder scope to follow   Anesthesia type: Spinal   Pre-op diagnosis: Right knee osteoarthritis   Location: [facility] 06 / [facility]   Surgeons: [name], MD       DISCUSSION:78 y.o. never smoker with h/o PONV, COPD, sleep apnea, CAD stents 2023, right knee OA scheduled for above procedure 03/12/23 with Dr. [name].   Pt with tracheobronchomalacia, follows with pulmonology.    Per previous anesthesia note 02/03/2022, 1st attempt with [facility] 2 blade, second attempt with glidescope without difficulty.   Per pulmonology preoperative evaluation 02/06/2023, "- This patient has a low risk of post-operative pulmonary complications by ARISCAT Index.  The absolute assessment of risk/benefit of the procedure is deferred to the primary team's evaluation. RECOMMENDATIONS:  In order to minimize the risk of complications and optimize pulmonary status, we recommend the following: - Encourage aggressive incentive spirometry hourly both peri-operatively and post-operatively as tolerated  - Early ambulation and physical therapy as tolerated post-operatively - Adequate pain control especially in the setting of abdominal and thoracic surgery - Bronchodilators as needed for wheezing or shortness of breath - Oral steroids only if the patient appears to have component of COPD exacerbation, otherwise no routine utilization needed - Ideally recommend smoking cessation for >4 weeks before any intervention - Intraoperatively keep OR time to the shortest as possible - Post operatively may benefit from Nocturnal Bipap"  Per cardiology preoperative evaluation 01/26/2023, "Preop clearance for Right TKA by Dr. Malon Kindle. Patient doing well without cardiac complaints. She's at reasonable risk for surgery without further cardiac testing. Can hold Plavix for 5  days if needed prior to surgery.  According to the Revised Cardiac Risk Index (RCRI), her Perioperative Risk of Major Cardiac Event is (%): 0.9   Her Functional Capacity in METs is: 5.62 according to the Duke Activity Status Index (DASI)."  S/p lumbar fusion with hardware in place L3-5.    VS: BP (!) 169/78   Pulse 65   Temp 36.7 C (Oral)   Resp 16   Ht 5' 3.5" (1.613 m)   Wt 69.9 kg   SpO2 98%   BMI 26.85 kg/m   PROVIDERS: [name], Limmie Patricia, MD is PCP   Cardiologist - Charlton Haws MD  LABS: Labs reviewed: Acceptable for surgery. (all labs ordered are listed, but only abnormal results are displayed)  Labs Reviewed  SURGICAL PCR SCREEN  CBC  BASIC METABOLIC PANEL     IMAGES:   EKG:   CV: Echo 06/23/2019 1. Left ventricular ejection fraction, by estimation, is 55 to 60%. The  left ventricle has normal function. The left ventricle has no regional  wall motion abnormalities. There is mild left ventricular hypertrophy.  Left ventricular diastolic function  could not be evaluated.   2. Right ventricular systolic function is normal. The right ventricular  size is normal.   3. The mitral valve is grossly normal. Trivial mitral valve  regurgitation. No evidence of mitral stenosis.   4. The aortic valve has an indeterminant number of cusps. Aortic valve  regurgitation is not visualized. Mild aortic valve sclerosis is present,  with no evidence of aortic valve stenosis.  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   right sholder   BCC (basal cell carcinoma of skin) 05/09/2014   mid lower back   BCC (basal cell carcinoma of skin) 05/03/2017   right low back   BCC (basal cell carcinoma of skin) 05/03/2017   left  upper back   BCC (basal cell carcinoma of skin) 07/05/2018   left mid back   BCC (basal cell carcinoma of skin) 05/20/1992   upper back   BCC (basal cell carcinoma of skin) 07/29/1993   left sholder medial   BCC (basal cell carcinoma of skin) 07/29/1993   left sholder lateral   BCC (basal cell carcinoma of skin) 07/29/1993   right thigh   BCC (basal cell carcinoma of skin) 07/29/1993   right sholder   BCC (basal cell carcinoma of skin) 12/22/1994   right mid forearm   BCC (basal cell carcinoma of skin) 12/22/1994   right upper forearm   BCC (basal cell carcinoma of skin) 12/22/1994   lower right upper arm   BCC (basal cell carcinoma of skin) 12/22/1994   right upper arm sholder   BCC (basal cell carcinoma of skin) 08/11/1995   left leg below knee   BCC (basal cell carcinoma of skin) 04/11/2002   mid back   BCC (basal cell carcinoma of skin) 12/10/2002   right center upper back   BCC (basal cell carcinoma of skin) 05/26/2005   right post sholder   BCC (basal cell carcinoma) 05/09/2014   left inner shin   BCC (basal cell carcinoma) 06/12/2014   left forearm   Blood transfusion without reported diagnosis 11/06/21   Bowen's disease 10/07/1994   right post knee, right inner forearm/wrist   Bowen's disease 08/11/1995   left sholder   Bronchiectasis (HCC)    Cataract    left   Chronic pain    Common variable immunodeficiency [facility])    sees dr Ellin Goodie 02-11-2021 epic   COPD (chronic obstructive pulmonary disease) (HCC) 2022   Brochiectosis / tracheobrocheomalacia   Coronary artery disease    Depression    hx of   Diverticulosis of colon 1998   mild   DJD (degenerative joint disease)    Duodenal ulcer    h/o yrs ago   Dysrhythmia    GERD (gastroesophageal reflux disease)    Heart murmur 1949   History of SCC (squamous cell carcinoma) of skin    Dr. Jorja Loa   History of sinus bradycardia    HLD (hyperlipidemia)    hypertriglyceridemia   Hypertensive retinopathy of  both eyes 11/18/2017   Hypothyroidism    IBS (irritable bowel syndrome)    Internal hemorrhoids 1998   Lymphocytic colitis    Mitral regurgitation    mild   Myocardial infarction [facility]) 2023   Cardiac CT 2023 - 2 cardiac stents placed   Nocardiosis    relased by infection disease dec 2022   Osteoarthritis    feet,shoulder,neck,back,hips and hands.   Pneumonia 2015   PONV (postoperative nausea and vomiting)    Rotator cuff tear, right 02/2019   infraspinatus and supraspinatus, and dislocation of long head of bicep tendon (Dr. Charlann Boxer)   SCC (squamous cell carcinoma) 11/26/2014   left hand, right hand, right deltoid mnay areas   SCC (squamous cell carcinoma) 05/03/2017   left cheek   Sleep apnea    uses  a mouth guard nightly   Squamous cell carcinoma in situ (SCCIS) 07/05/2018   left hand   Tracheobronchomalacia    Trigger finger    left hand ring finger   Vitamin D deficiency    mild    Past Surgical History:  Procedure Laterality Date   ABDOMINAL EXPOSURE N/A 02/02/2022   Procedure: ABDOMINAL EXPOSURE;  Surgeon: Cephus Shelling, MD;  Location: [facility] OR;  Service: Vascular;  Laterality: N/A;   ABDOMINAL HYSTERECTOMY  1998   complete   BIOPSY  11/05/2021   Procedure: BIOPSY;  Surgeon: Tressia Danas, MD;  Location: [facility] ENDOSCOPY;  Service: Gastroenterology;;   BLEPHAROPLASTY Bilateral 01/2018   BROW LIFT Bilateral 10/10/2018   Procedure: Coralee Rud LIFT;  Surgeon: Peggye Form, DO;  Location: [facility];  Service: Plastics;  Laterality: Bilateral;   BUNIONECTOMY     R 12/08, L 2004 (Dr. Wynelle Cleveland)   CARDIAC CATHETERIZATION     CARPAL TUNNEL RELEASE  1989   bilateral   CARPAL TUNNEL RELEASE Left 03/06/2021   Procedure: Left Revision Carpal Tunnel Release with hypothenar fat pad flap;  Surgeon: Gomez Cleverly, MD;  Location: [facility];  Service: Orthopedics;  Laterality: Left;  with local anesthesia   CATARACT EXTRACTION Bilateral Left in  08/2012, Right 04/2016   Dr.Hecker   CESAREAN SECTION     1981 and 1986   CHOLECYSTECTOMY  1992   laparoscopic   CLOSED REDUCTION NASAL FRACTURE N/A 08/29/2018   Procedure: CLOSED REDUCTION NASAL FRACTURE;  Surgeon: Peggye Form, DO;  Location: [facility];  Service: Plastics;  Laterality: N/A;  1 hour, please   COLONOSCOPY  2006   COLONOSCOPY  01/2012   due again 01/2021; mild diverticulosis   CORONARY STENT INTERVENTION N/A 05/19/2021   Procedure: CORONARY STENT INTERVENTION;  Surgeon: Yvonne Kendall, MD;  Location: [facility] INVASIVE [facility] LAB;  Service: Cardiovascular;  Laterality: N/A;   CORONARY ULTRASOUND/IVUS N/A 05/19/2021   Procedure: Intravascular Ultrasound/IVUS;  Surgeon: Yvonne Kendall, MD;  Location: [facility]VAS[facility] LAB;  Service: Cardiovascular;  Laterality: N/A;   COSMETIC SURGERY  2018   epidural steroid injection, back  02/2010   ESOPHAGOGASTRODUODENOSCOPY (EGD) WITH PROPOFOL N/A 11/05/2021   Procedure: ESOPHAGOGASTRODUODENOSCOPY (EGD) WITH PROPOFOL;  Surgeon: Tressia Danas, MD;  Location: [facility] ENDOSCOPY;  Service: Gastroenterology;  Laterality: N/A;   EYE SURGERY  2017   HIP SURGERY     right bursectomy x 3   HIP SURGERY Right 2000   torn cartilage, repaired   INGUINAL HERNIA REPAIR  10/2007   bilat   JOINT REPLACEMENT  04/11/2019   Right shoulder   LEFT HEART CATH AND CORONARY ANGIOGRAPHY N/A 05/19/2021   Procedure: LEFT HEART CATH AND CORONARY ANGIOGRAPHY;  Surgeon: Yvonne Kendall, MD;  [facility];  Service: Cardiovascular;  Laterality: N/A;   LUMBAR FUSION  01/2022   NECK SURGERY  1989   c6-7 cervical laminectomy and diskecotmy   REVERSE SHOULDER ARTHROPLASTY Right 04/11/2019   Procedure: REVERSE SHOULDER ARTHROPLASTY;  Surgeon: Yolonda Kida, MD;  Location[facility] OR;  Service: Orthopedics;  Laterality: Right;  Regional Block   SHOULDER SURGERY Left 03/2003   left rotator cuff repair   SHOULDER SURGERY Left  03/14/2018   rotator cuff repair; Dr. Shelle Iron   SPINE SURGERY  1989 / lumbar   TONSILLECTOMY  age 41   TROCHANTERIC BURSA EXCISION Right 1978   TUBAL LIGATION  1986   UPPER GASTROINTESTINAL ENDOSCOPY     WISDOM  TOOTH EXTRACTION     as teenager    MEDICATIONS:  acetaminophen (TYLENOL) 650 MG CR tablet   albuterol (VENTOLIN HFA) 108 (90 Base) MCG/ACT inhaler   ARTIFICIAL TEAR OP   atorvastatin (LIPITOR) 40 MG tablet   budesonide (ENTOCORT EC) 3 MG 24 hr capsule   Buprenorphine HCl (BELBUCA) 750 MCG FILM   Calcium Carbonate Antacid (TUMS PO)   Calcium Carbonate-Vitamin D 600-200 MG-UNIT TABS   clopidogrel (PLAVIX) 75 MG tablet   diclofenac Sodium (VOLTAREN) 1 % GEL   dicyclomine (BENTYL) 10 MG capsule   docusate sodium (COLACE) 100 MG capsule   EPINEPHrine 0.3 mg/0.3 mL IJ SOAJ injection   famotidine (PEPCID) 40 MG tablet   fluorouracil (EFUDEX) 5 % cream   fluorouracil (EFUDEX) 5 % cream   guaiFENesin (MUCINEX) 600 MG 12 hr tablet   HYDROcodone-acetaminophen (NORCO) 7.5-325 MG tablet   ipratropium (ATROVENT) 0.06 % nasal spray   Lactobacillus-Inulin (CULTURELLE ADULT ULT BALANCE) CAPS   levocetirizine (XYZAL) 5 MG tablet   levothyroxine (SYNTHROID) 25 MCG tablet   loratadine (CLARITIN) 10 MG tablet   melatonin 5 MG TABS   methocarbamol (ROBAXIN) 500 MG tablet   methocarbamol (ROBAXIN) 500 MG tablet   montelukast (SINGULAIR) 10 MG tablet   nitroGLYCERIN (NITROSTAT) 0.4 MG SL tablet   omega-3 acid ethyl esters (LOVAZA) 1 g capsule   pantoprazole (PROTONIX) 40 MG tablet   polyethylene glycol (MIRALAX / GLYCOLAX) 17 g packet   pregabalin (LYRICA) 50 MG capsule   pregabalin (LYRICA) 50 MG capsule   psyllium (METAMUCIL) 58.6 % packet   valACYclovir (VALTREX) 500 MG tablet   venlafaxine XR (EFFEXOR XR) 75 MG 24 hr capsule   XEMBIFY 10 GM/50ML SOLN   No current facility-administered medications for this encounter.    Jodell Cipro Ward, PA-C [facility] [phone]

## 2023-03-11 NOTE — Anesthesia Preprocedure Evaluation (Addendum)
Anesthesia Evaluation  Patient identified by MRN, date of birth, ID band Patient awake    Reviewed: Allergy & Precautions, NPO status , Patient's Chart, lab work & pertinent test results, reviewed documented beta blocker date and time   History of Anesthesia Complications (+) PONV and history of anesthetic complications  Airway Mallampati: II  TM Distance: >3 FB     Dental no notable dental hx. (+) Teeth Intact, Dental Advisory Given   Pulmonary shortness of breath and with exertion, asthma , sleep apnea , pneumonia, resolved, COPD,  COPD inhaler Uses a mouth guard   breath sounds clear to auscultation + decreased breath sounds      Cardiovascular + CAD, + Past MI and + Cardiac Stents  Normal cardiovascular exam+ dysrhythmias Atrial Fibrillation + Valvular Problems/Murmurs  Rhythm:Regular Rate:Normal  Cardiac stents x 2 05/19/2021  Cardiac Cath 1. Severe two-vessel coronary artery disease with up to 90% stenoses involving the ostial through mid LAD and ostial through proximal ramus intermedius.  Mild, nonobstructive CAD noted in the LCx and RCA. 2. Normal left ventricular systolic function (LVEF 55-65%) with normal filling pressure (LVEDP 10 mmHg) 3. Successful IVUS-guided PCI to ostial through mid LAD using Onyx Frontier 2.75 x 38 mm drug-eluting stent with 0% residual stenosis and TIMI-3 flow 4. Successful PCI to ostial through proximal ramus intermedius using Onyx Frontier 2.0 x 22 mm drug-eluting stent with 0% residual stenosis and TIMI-3 flow.  Echo 06/23/19  1. Left ventricular ejection fraction, by estimation, is 55 to 60%. The  left ventricle has normal function. The left ventricle has no regional  wall motion abnormalities. There is mild left ventricular hypertrophy.  Left ventricular diastolic function  could not be evaluated.   2. Right ventricular systolic function is normal. The right ventricular  size is normal.   3.  The mitral valve is grossly normal. Trivial mitral valve  regurgitation. No evidence of mitral stenosis.   4. The aortic valve has an indeterminant number of cusps. Aortic valve  regurgitation is not visualized. Mild aortic valve sclerosis is present,  with no evidence of aortic valve stenosis.   EKG 01/26/23 Sinus rhythm with 1st degree A-V block with occasional Premature ventricular complexes Low voltage QRS Incomplete right bundle branch block Cannot rule out Anteroseptal infarct (cited on or before 20-May-2021) When compared with ECG of 05-Nov-2021 17:55, Premature ventricular complexes are now Present Nonspecific T wave abnormality has replaced inverted T waves in Anterior leads      Neuro/Psych  PSYCHIATRIC DISORDERS  Depression    Hypertensive retinopathy  Neuromuscular disease    GI/Hepatic Neg liver ROS, PUD,GERD  Medicated,,  Endo/Other  Hypothyroidism  Hyperlipidemia  Renal/GU negative Renal ROS  negative genitourinary   Musculoskeletal  (+) Arthritis , Osteoarthritis,  OA right knee S/P lumbar fusion L3-5 Hx/o scoliosis lumbar spine   Abdominal   Peds  Hematology  (+) Blood dyscrasia, anemia Plavix therapy- last dose 1/16   Anesthesia Other Findings   Reproductive/Obstetrics                             Anesthesia Physical Anesthesia Plan  ASA: 3  Anesthesia Plan: Spinal   Post-op Pain Management: Minimal or no pain anticipated and Regional block*   Induction: Intravenous  PONV Risk Score and Plan: 4 or greater and Treatment may vary due to age or medical condition and Propofol infusion  Airway Management Planned: Simple Face Mask and Natural Airway  Additional Equipment: None  Intra-op Plan:   Post-operative Plan:   Informed Consent: I have reviewed the patients History and Physical, chart, labs and discussed the procedure including the risks, benefits and alternatives for the proposed anesthesia with the patient or  authorized representative who has indicated his/her understanding and acceptance.     Dental advisory given  Plan Discussed with: Anesthesiologist and CRNA  Anesthesia Plan Comments: (See PAT note 03/11/2023)       Anesthesia Quick Evaluation

## 2023-03-12 ENCOUNTER — Ambulatory Visit (HOSPITAL_COMMUNITY): Payer: Medicare PPO

## 2023-03-12 ENCOUNTER — Other Ambulatory Visit: Payer: Self-pay

## 2023-03-12 ENCOUNTER — Ambulatory Visit (HOSPITAL_COMMUNITY): Payer: Medicare PPO | Admitting: Physician Assistant

## 2023-03-12 ENCOUNTER — Encounter (HOSPITAL_COMMUNITY): Payer: Self-pay | Admitting: Orthopedic Surgery

## 2023-03-12 ENCOUNTER — Encounter (HOSPITAL_COMMUNITY)
Admission: RE | Disposition: A | Discharge status: Home / Self Care | Payer: Self-pay | Source: Home / Self Care | Attending: Orthopedic Surgery

## 2023-03-12 ENCOUNTER — Observation Stay (HOSPITAL_COMMUNITY)
Admission: RE | Admit: 2023-03-12 | Discharge: 2023-03-13 | Disposition: A | Discharge status: Home / Self Care | Payer: Medicare PPO | Attending: Orthopedic Surgery | Admitting: Orthopedic Surgery

## 2023-03-12 DIAGNOSIS — I2510 Atherosclerotic heart disease of native coronary artery without angina pectoris: Secondary | ICD-10-CM

## 2023-03-12 DIAGNOSIS — Z7722 Contact with and (suspected) exposure to environmental tobacco smoke (acute) (chronic): Secondary | ICD-10-CM | POA: Insufficient documentation

## 2023-03-12 DIAGNOSIS — Z85828 Personal history of other malignant neoplasm of skin: Secondary | ICD-10-CM | POA: Diagnosis not present

## 2023-03-12 DIAGNOSIS — J449 Chronic obstructive pulmonary disease, unspecified: Secondary | ICD-10-CM

## 2023-03-12 DIAGNOSIS — Z96611 Presence of right artificial shoulder joint: Secondary | ICD-10-CM | POA: Diagnosis not present

## 2023-03-12 DIAGNOSIS — M21061 Valgus deformity, not elsewhere classified, right knee: Secondary | ICD-10-CM | POA: Diagnosis not present

## 2023-03-12 DIAGNOSIS — E039 Hypothyroidism, unspecified: Secondary | ICD-10-CM | POA: Insufficient documentation

## 2023-03-12 DIAGNOSIS — M1711 Unilateral primary osteoarthritis, right knee: Secondary | ICD-10-CM

## 2023-03-12 DIAGNOSIS — Z955 Presence of coronary angioplasty implant and graft: Secondary | ICD-10-CM | POA: Insufficient documentation

## 2023-03-12 DIAGNOSIS — Z96651 Presence of right artificial knee joint: Principal | ICD-10-CM

## 2023-03-12 DIAGNOSIS — Z79899 Other long term (current) drug therapy: Secondary | ICD-10-CM | POA: Diagnosis not present

## 2023-03-12 DIAGNOSIS — I4891 Unspecified atrial fibrillation: Secondary | ICD-10-CM | POA: Diagnosis not present

## 2023-03-12 DIAGNOSIS — G8918 Other acute postprocedural pain: Secondary | ICD-10-CM | POA: Diagnosis not present

## 2023-03-12 HISTORY — PX: TOTAL KNEE ARTHROPLASTY: SHX125

## 2023-03-12 SURGERY — ARTHROPLASTY, KNEE, TOTAL
Anesthesia: Spinal | Site: Knee | Laterality: Right

## 2023-03-12 MED ORDER — POVIDONE-IODINE 10 % EX SWAB: 2.0000 | Freq: Once | CUTANEOUS | Status: DC

## 2023-03-12 MED ORDER — BISACODYL 10 MG RE SUPP: 10.0000 mg | Freq: Every day | RECTAL | Status: DC | PRN

## 2023-03-12 MED ORDER — PROPOFOL 1000 MG/100ML IV EMUL
INTRAVENOUS | Status: AC
Start: 2023-03-12 — End: ?
  Filled 2023-03-12: qty 100

## 2023-03-12 MED ORDER — SODIUM CHLORIDE 0.9% FLUSH: 3.0000 mL | INTRAVENOUS | Status: DC | PRN

## 2023-03-12 MED ORDER — DICYCLOMINE HCL 10 MG PO CAPS: 10.0000 mg | ORAL_CAPSULE | Freq: Two times a day (BID) | ORAL | Status: DC | PRN

## 2023-03-12 MED ORDER — ASPIRIN 81 MG PO CHEW
81.0000 mg | CHEWABLE_TABLET | Freq: Two times a day (BID) | ORAL | Status: DC
  Administered 2023-03-13: 81 mg via ORAL
  Filled 2023-03-12: qty 1

## 2023-03-12 MED ORDER — PANTOPRAZOLE SODIUM 40 MG PO TBEC
40.0000 mg | DELAYED_RELEASE_TABLET | Freq: Every day | ORAL | Status: DC
Start: 2023-03-13 — End: 2023-03-13
  Administered 2023-03-13: 40 mg via ORAL
  Filled 2023-03-12: qty 1

## 2023-03-12 MED ORDER — SODIUM CHLORIDE 0.9% FLUSH: 3.0000 mL | Freq: Two times a day (BID) | INTRAVENOUS | Status: DC

## 2023-03-12 MED ORDER — FLUOROURACIL 5 % EX CREA: 1.0000 | TOPICAL_CREAM | Freq: Two times a day (BID) | CUTANEOUS | Status: DC

## 2023-03-12 MED ORDER — IMMUNE GLOBULIN (HUMAN)-KLHW 10 GM/50ML ~~LOC~~ SOLN: 10.0000 g | SUBCUTANEOUS | Status: DC

## 2023-03-12 MED ORDER — SODIUM CHLORIDE (PF) 0.9 % IJ SOLN
INTRAMUSCULAR | Status: DC | PRN
  Administered 2023-03-12: 30 mL

## 2023-03-12 MED ORDER — METOCLOPRAMIDE HCL 5 MG PO TABS: 5.0000 mg | ORAL_TABLET | Freq: Three times a day (TID) | ORAL | Status: DC | PRN

## 2023-03-12 MED ORDER — AMISULPRIDE (ANTIEMETIC) 5 MG/2ML IV SOLN: 10.0000 mg | Freq: Once | INTRAVENOUS | Status: DC | PRN

## 2023-03-12 MED ORDER — ONDANSETRON HCL 4 MG PO TABS: 4.0000 mg | ORAL_TABLET | Freq: Four times a day (QID) | ORAL | Status: DC | PRN

## 2023-03-12 MED ORDER — ONDANSETRON HCL 4 MG/2ML IJ SOLN
INTRAMUSCULAR | Status: DC | PRN
  Administered 2023-03-12: 4 mg via INTRAVENOUS

## 2023-03-12 MED ORDER — LACTATED RINGERS IV SOLN: INTRAVENOUS | Status: DC

## 2023-03-12 MED ORDER — CALCIUM CARBONATE ANTACID 500 MG PO CHEW: 1.0000 | CHEWABLE_TABLET | ORAL | Status: DC | PRN

## 2023-03-12 MED ORDER — LORATADINE 10 MG PO TABS: 10.0000 mg | ORAL_TABLET | Freq: Every evening | ORAL | Status: DC

## 2023-03-12 MED ORDER — FENTANYL CITRATE PF 50 MCG/ML IJ SOSY
50.0000 ug | PREFILLED_SYRINGE | INTRAMUSCULAR | Status: DC | PRN
  Administered 2023-03-12: 50 ug via INTRAVENOUS
  Filled 2023-03-12: qty 2

## 2023-03-12 MED ORDER — BUPIVACAINE-EPINEPHRINE 0.25% -1:200000 IJ SOLN
INTRAMUSCULAR | Status: DC | PRN
  Administered 2023-03-12: 30 mL

## 2023-03-12 MED ORDER — METHOCARBAMOL 1000 MG/10ML IJ SOLN: 500.0000 mg | Freq: Four times a day (QID) | INTRAMUSCULAR | Status: DC | PRN

## 2023-03-12 MED ORDER — ONDANSETRON HCL 4 MG/2ML IJ SOLN
INTRAMUSCULAR | Status: AC
  Filled 2023-03-12: qty 2

## 2023-03-12 MED ORDER — HYDROCODONE-ACETAMINOPHEN 7.5-325 MG PO TABS
1.0000 | ORAL_TABLET | ORAL | Status: DC | PRN
Start: 2023-03-12 — End: 2023-03-13
  Administered 2023-03-12: 1 via ORAL
  Administered 2023-03-12: 2 via ORAL
  Administered 2023-03-12: 1 via ORAL
  Administered 2023-03-13 (×2): 2 via ORAL
  Filled 2023-03-12: qty 2
  Filled 2023-03-12: qty 1
  Filled 2023-03-12 (×2): qty 2
  Filled 2023-03-12: qty 1

## 2023-03-12 MED ORDER — CLOPIDOGREL BISULFATE 75 MG PO TABS
75.0000 mg | ORAL_TABLET | Freq: Every day | ORAL | Status: DC
  Administered 2023-03-13: 75 mg via ORAL
  Filled 2023-03-12: qty 1

## 2023-03-12 MED ORDER — FENTANYL CITRATE (PF) 100 MCG/2ML IJ SOLN
INTRAMUSCULAR | Status: DC | PRN
  Administered 2023-03-12 (×2): 50 ug via INTRAVENOUS

## 2023-03-12 MED ORDER — ONDANSETRON HCL 4 MG/2ML IJ SOLN: 4.0000 mg | Freq: Once | INTRAMUSCULAR | Status: DC | PRN

## 2023-03-12 MED ORDER — GUAIFENESIN ER 600 MG PO TB12
600.0000 mg | ORAL_TABLET | Freq: Two times a day (BID) | ORAL | Status: DC
  Administered 2023-03-12 – 2023-03-13 (×2): 600 mg via ORAL
  Filled 2023-03-12 (×2): qty 1

## 2023-03-12 MED ORDER — BUPIVACAINE LIPOSOME 1.3 % IJ SUSP
INTRAMUSCULAR | Status: AC
  Filled 2023-03-12: qty 20

## 2023-03-12 MED ORDER — MONTELUKAST SODIUM 10 MG PO TABS: 10.0000 mg | ORAL_TABLET | Freq: Every day | ORAL | Status: DC

## 2023-03-12 MED ORDER — HYDROMORPHONE HCL 1 MG/ML IJ SOLN: 0.2500 mg | INTRAMUSCULAR | Status: DC | PRN

## 2023-03-12 MED ORDER — LORATADINE 10 MG PO TABS: 10.0000 mg | ORAL_TABLET | Freq: Every day | ORAL | Status: DC | PRN

## 2023-03-12 MED ORDER — BUPIVACAINE-EPINEPHRINE 0.25% -1:200000 IJ SOLN
INTRAMUSCULAR | Status: AC
  Filled 2023-03-12: qty 1

## 2023-03-12 MED ORDER — CEFAZOLIN SODIUM-DEXTROSE 2-4 GM/100ML-% IV SOLN
2.0000 g | Freq: Four times a day (QID) | INTRAVENOUS | Status: AC
  Administered 2023-03-12 – 2023-03-13 (×2): 2 g via INTRAVENOUS
  Filled 2023-03-12 (×2): qty 100

## 2023-03-12 MED ORDER — 0.9 % SODIUM CHLORIDE (POUR BTL) OPTIME
TOPICAL | Status: DC | PRN
  Administered 2023-03-12: 1000 mL

## 2023-03-12 MED ORDER — VITAMIN D 25 MCG (1000 UNIT) PO TABS
5000.0000 [IU] | ORAL_TABLET | Freq: Every day | ORAL | Status: DC
  Administered 2023-03-13: 5000 [IU] via ORAL
  Filled 2023-03-12: qty 5

## 2023-03-12 MED ORDER — HYDROCODONE-ACETAMINOPHEN 7.5-325 MG PO TABS
1.0000 | ORAL_TABLET | Freq: Four times a day (QID) | ORAL | 0 refills | Status: DC | PRN
  Filled 2023-03-12: qty 30, 8d supply, fill #0

## 2023-03-12 MED ORDER — ADULT MULTIVITAMIN W/MINERALS CH
1.0000 | ORAL_TABLET | Freq: Every day | ORAL | Status: DC
  Administered 2023-03-13: 1 via ORAL
  Filled 2023-03-12: qty 1

## 2023-03-12 MED ORDER — BUPIVACAINE LIPOSOME 1.3 % IJ SUSP: 20.0000 mL | Freq: Once | INTRAMUSCULAR | Status: DC

## 2023-03-12 MED ORDER — BUPIVACAINE LIPOSOME 1.3 % IJ SUSP
INTRAMUSCULAR | Status: DC | PRN
  Administered 2023-03-12: 20 mL

## 2023-03-12 MED ORDER — PHENOL 1.4 % MT LIQD: 1.0000 | OROMUCOSAL | Status: DC | PRN

## 2023-03-12 MED ORDER — VALACYCLOVIR HCL 500 MG PO TABS
500.0000 mg | ORAL_TABLET | Freq: Every day | ORAL | Status: DC
Start: 2023-03-13 — End: 2023-03-13
  Administered 2023-03-13: 500 mg via ORAL
  Filled 2023-03-12: qty 1

## 2023-03-12 MED ORDER — TART CHERRY 1200 MG PO CAPS: ORAL_CAPSULE | Freq: Every day | ORAL | Status: DC

## 2023-03-12 MED ORDER — OXYCODONE HCL 5 MG PO TABS: 5.0000 mg | ORAL_TABLET | Freq: Once | ORAL | Status: DC | PRN

## 2023-03-12 MED ORDER — ASPIRIN 81 MG PO CHEW
81.0000 mg | CHEWABLE_TABLET | Freq: Two times a day (BID) | ORAL | 0 refills | Status: AC
  Filled 2023-03-12: qty 60, 30d supply, fill #0

## 2023-03-12 MED ORDER — MELATONIN 5 MG PO TABS
5.0000 mg | ORAL_TABLET | Freq: Every day | ORAL | Status: DC
  Administered 2023-03-12: 5 mg via ORAL
  Filled 2023-03-12: qty 1

## 2023-03-12 MED ORDER — CULTURELLE ADULT ULT BALANCE PO CAPS: 1.0000 | ORAL_CAPSULE | Freq: Every day | ORAL | Status: DC

## 2023-03-12 MED ORDER — TURMERIC 500 MG PO CAPS: ORAL_CAPSULE | Freq: Two times a day (BID) | ORAL | Status: DC

## 2023-03-12 MED ORDER — ORAL CARE MOUTH RINSE
15.0000 mL | Freq: Once | OROMUCOSAL | Status: AC
Start: 2023-03-12 — End: 2023-03-12

## 2023-03-12 MED ORDER — BUPIVACAINE IN DEXTROSE 0.75-8.25 % IT SOLN
INTRATHECAL | Status: DC | PRN
  Administered 2023-03-12: 1.8 mL via INTRATHECAL

## 2023-03-12 MED ORDER — NITROGLYCERIN 0.4 MG SL SUBL: 0.4000 mg | SUBLINGUAL_TABLET | SUBLINGUAL | Status: DC | PRN

## 2023-03-12 MED ORDER — OXYCODONE HCL 5 MG/5ML PO SOLN: 5.0000 mg | Freq: Once | ORAL | Status: DC | PRN

## 2023-03-12 MED ORDER — ACETAMINOPHEN 325 MG PO TABS
325.0000 mg | ORAL_TABLET | Freq: Four times a day (QID) | ORAL | Status: DC | PRN
Start: 2023-03-13 — End: 2023-03-13

## 2023-03-12 MED ORDER — OYSTER SHELL CALCIUM/D3 500-5 MG-MCG PO TABS
1.0000 | ORAL_TABLET | Freq: Two times a day (BID) | ORAL | Status: DC
  Administered 2023-03-12 – 2023-03-13 (×2): 1 via ORAL
  Filled 2023-03-12 (×2): qty 1

## 2023-03-12 MED ORDER — TRANEXAMIC ACID-NACL 1000-0.7 MG/100ML-% IV SOLN
1000.0000 mg | INTRAVENOUS | Status: AC
  Administered 2023-03-12: 1000 mg via INTRAVENOUS
  Filled 2023-03-12: qty 100

## 2023-03-12 MED ORDER — LEVOTHYROXINE SODIUM 25 MCG PO TABS
25.0000 ug | ORAL_TABLET | Freq: Every day | ORAL | Status: DC
  Administered 2023-03-13: 25 ug via ORAL
  Filled 2023-03-12: qty 1

## 2023-03-12 MED ORDER — PREGABALIN 50 MG PO CAPS: 50.0000 mg | ORAL_CAPSULE | Freq: Three times a day (TID) | ORAL | Status: DC

## 2023-03-12 MED ORDER — IPRATROPIUM BROMIDE 0.06 % NA SOLN: 2.0000 | Freq: Four times a day (QID) | NASAL | Status: DC | PRN

## 2023-03-12 MED ORDER — PROPOFOL 10 MG/ML IV BOLUS
INTRAVENOUS | Status: DC | PRN
  Administered 2023-03-12: 20 mg via INTRAVENOUS
  Administered 2023-03-12 (×2): 30 mg via INTRAVENOUS

## 2023-03-12 MED ORDER — FENTANYL CITRATE (PF) 100 MCG/2ML IJ SOLN
INTRAMUSCULAR | Status: AC
  Filled 2023-03-12: qty 2

## 2023-03-12 MED ORDER — ONDANSETRON HCL 4 MG/2ML IJ SOLN: 4.0000 mg | Freq: Four times a day (QID) | INTRAMUSCULAR | Status: DC | PRN

## 2023-03-12 MED ORDER — SENNOSIDES-DOCUSATE SODIUM 8.6-50 MG PO TABS: 1.0000 | ORAL_TABLET | Freq: Every evening | ORAL | Status: DC | PRN

## 2023-03-12 MED ORDER — DOCUSATE SODIUM 100 MG PO CAPS
100.0000 mg | ORAL_CAPSULE | Freq: Two times a day (BID) | ORAL | Status: DC
Start: 2023-03-12 — End: 2023-03-13
  Administered 2023-03-12 – 2023-03-13 (×2): 100 mg via ORAL
  Filled 2023-03-12 (×2): qty 1

## 2023-03-12 MED ORDER — DICLOFENAC SODIUM 1 % EX GEL: 2.0000 g | Freq: Four times a day (QID) | CUTANEOUS | Status: DC | PRN

## 2023-03-12 MED ORDER — BUPRENORPHINE HCL 750 MCG BU FILM: 750.0000 ug | ORAL_FILM | Freq: Two times a day (BID) | BUCCAL | Status: DC

## 2023-03-12 MED ORDER — ATORVASTATIN CALCIUM 40 MG PO TABS
40.0000 mg | ORAL_TABLET | Freq: Every day | ORAL | Status: DC
  Administered 2023-03-13: 40 mg via ORAL
  Filled 2023-03-12: qty 1

## 2023-03-12 MED ORDER — PREGABALIN 50 MG PO CAPS
50.0000 mg | ORAL_CAPSULE | Freq: Two times a day (BID) | ORAL | Status: DC
  Administered 2023-03-12 – 2023-03-13 (×2): 50 mg via ORAL
  Filled 2023-03-12 (×2): qty 1

## 2023-03-12 MED ORDER — MIDAZOLAM HCL 2 MG/2ML IJ SOLN
1.0000 mg | INTRAMUSCULAR | Status: DC | PRN
  Filled 2023-03-12: qty 2

## 2023-03-12 MED ORDER — METOCLOPRAMIDE HCL 5 MG/ML IJ SOLN: 5.0000 mg | Freq: Three times a day (TID) | INTRAMUSCULAR | Status: DC | PRN

## 2023-03-12 MED ORDER — PROPOFOL 1000 MG/100ML IV EMUL
INTRAVENOUS | Status: AC
  Filled 2023-03-12: qty 100

## 2023-03-12 MED ORDER — VENLAFAXINE HCL ER 75 MG PO CP24
75.0000 mg | ORAL_CAPSULE | Freq: Every day | ORAL | Status: DC
  Administered 2023-03-13: 75 mg via ORAL
  Filled 2023-03-12: qty 1

## 2023-03-12 MED ORDER — BUDESONIDE 3 MG PO CPEP
3.0000 mg | ORAL_CAPSULE | Freq: Every day | ORAL | Status: DC
  Administered 2023-03-13: 3 mg via ORAL
  Filled 2023-03-12: qty 1

## 2023-03-12 MED ORDER — DEXAMETHASONE SODIUM PHOSPHATE 10 MG/ML IJ SOLN
INTRAMUSCULAR | Status: AC
Start: 2023-03-12 — End: ?
  Filled 2023-03-12: qty 1

## 2023-03-12 MED ORDER — MORPHINE SULFATE (PF) 2 MG/ML IV SOLN: 0.5000 mg | INTRAVENOUS | Status: DC | PRN

## 2023-03-12 MED ORDER — SODIUM CHLORIDE 0.9 % IR SOLN
Status: DC | PRN
  Administered 2023-03-12: 1000 mL

## 2023-03-12 MED ORDER — TRANEXAMIC ACID-NACL 1000-0.7 MG/100ML-% IV SOLN
1000.0000 mg | Freq: Once | INTRAVENOUS | Status: AC
Start: 2023-03-12 — End: 2023-03-12
  Administered 2023-03-12: 1000 mg via INTRAVENOUS
  Filled 2023-03-12: qty 100

## 2023-03-12 MED ORDER — EPINEPHRINE 0.3 MG/0.3ML IJ SOAJ
0.3000 mg | INTRAMUSCULAR | Status: DC | PRN
Start: 2023-03-12 — End: 2023-03-12

## 2023-03-12 MED ORDER — CEFAZOLIN SODIUM-DEXTROSE 2-4 GM/100ML-% IV SOLN
2.0000 g | INTRAVENOUS | Status: AC
  Administered 2023-03-12: 2 g via INTRAVENOUS
  Filled 2023-03-12: qty 100

## 2023-03-12 MED ORDER — ALBUTEROL SULFATE (2.5 MG/3ML) 0.083% IN NEBU: 2.5000 mg | INHALATION_SOLUTION | RESPIRATORY_TRACT | Status: DC | PRN

## 2023-03-12 MED ORDER — FAMOTIDINE 20 MG PO TABS
40.0000 mg | ORAL_TABLET | Freq: Every day | ORAL | Status: DC
  Administered 2023-03-13: 40 mg via ORAL
  Filled 2023-03-12: qty 2

## 2023-03-12 MED ORDER — METHOCARBAMOL 500 MG PO TABS
500.0000 mg | ORAL_TABLET | Freq: Four times a day (QID) | ORAL | Status: DC | PRN
  Administered 2023-03-12 – 2023-03-13 (×3): 500 mg via ORAL
  Filled 2023-03-12 (×3): qty 1

## 2023-03-12 MED ORDER — FLUOROURACIL 5 % EX CREA: TOPICAL_CREAM | Freq: Two times a day (BID) | CUTANEOUS | Status: DC

## 2023-03-12 MED ORDER — WATER FOR IRRIGATION, STERILE IR SOLN
Status: DC | PRN
  Administered 2023-03-12: 1000 mL

## 2023-03-12 MED ORDER — PROPOFOL 500 MG/50ML IV EMUL
INTRAVENOUS | Status: DC | PRN
  Administered 2023-03-12: 75 ug/kg/min via INTRAVENOUS

## 2023-03-12 MED ORDER — PHENYLEPHRINE HCL-NACL 20-0.9 MG/250ML-% IV SOLN
INTRAVENOUS | Status: DC | PRN
  Administered 2023-03-12: 20 ug/min via INTRAVENOUS

## 2023-03-12 MED ORDER — CHLORHEXIDINE GLUCONATE 0.12 % MT SOLN
15.0000 mL | Freq: Once | OROMUCOSAL | Status: AC
  Administered 2023-03-12: 15 mL via OROMUCOSAL

## 2023-03-12 MED ORDER — ACETAMINOPHEN ER 650 MG PO TBCR: 650.0000 mg | EXTENDED_RELEASE_TABLET | Freq: Three times a day (TID) | ORAL | Status: DC | PRN

## 2023-03-12 MED ORDER — METHOCARBAMOL 500 MG PO TABS
500.0000 mg | ORAL_TABLET | Freq: Three times a day (TID) | ORAL | 1 refills | Status: DC | PRN
  Filled 2023-03-12: qty 60, 20d supply, fill #0

## 2023-03-12 MED ORDER — OMEGA-3-ACID ETHYL ESTERS 1 G PO CAPS
2.0000 g | ORAL_CAPSULE | Freq: Two times a day (BID) | ORAL | Status: DC
  Administered 2023-03-12 – 2023-03-13 (×2): 2 g via ORAL
  Filled 2023-03-12 (×2): qty 2

## 2023-03-12 MED ORDER — MENTHOL 3 MG MT LOZG: 1.0000 | LOZENGE | OROMUCOSAL | Status: DC | PRN

## 2023-03-12 MED ORDER — ROPIVACAINE HCL 5 MG/ML IJ SOLN
INTRAMUSCULAR | Status: DC | PRN
  Administered 2023-03-12: 30 mL via PERINEURAL

## 2023-03-12 SURGICAL SUPPLY — 46 items
ATTUNE PSFEM RTSZ6 NARCEM KNEE (Femur) IMPLANT
ATTUNE PSRP INSR SZ6 5 KNEE (Insert) IMPLANT
BAG COUNTER SPONGE SURGICOUNT (BAG) IMPLANT
BAG ZIPLOCK 12X15 (MISCELLANEOUS) IMPLANT
BASE TIBIAL ROT PLAT SZ 5 KNEE (Knees) IMPLANT
BLADE SAG 18X100X1.27 (BLADE) ×1 IMPLANT
BLADE SAW SGTL 13X75X1.27 (BLADE) ×1 IMPLANT
BNDG ELASTIC 6X10 VLCR STRL LF (GAUZE/BANDAGES/DRESSINGS) ×1 IMPLANT
BNDG GAUZE DERMACEA FLUFF 4 (GAUZE/BANDAGES/DRESSINGS) ×1 IMPLANT
BOWL SMART MIX CTS (DISPOSABLE) ×1 IMPLANT
CEMENT HV SMART SET (Cement) ×2 IMPLANT
COVER SURGICAL LIGHT HANDLE (MISCELLANEOUS) ×1 IMPLANT
CUFF TRNQT CYL 34X4.125X (TOURNIQUET CUFF) ×1 IMPLANT
DRAPE INCISE IOBAN 66X45 STRL (DRAPES) IMPLANT
DRAPE SHEET LG 3/4 BI-LAMINATE (DRAPES) ×1 IMPLANT
DRAPE U-SHAPE 47X51 STRL (DRAPES) ×1 IMPLANT
DRSG ADAPTIC 3X8 NADH LF (GAUZE/BANDAGES/DRESSINGS) ×1 IMPLANT
DURAPREP 26ML APPLICATOR (WOUND CARE) ×1 IMPLANT
ELECT REM PT RETURN 15FT ADLT (MISCELLANEOUS) ×1 IMPLANT
GAUZE PAD ABD 8X10 STRL (GAUZE/BANDAGES/DRESSINGS) ×1 IMPLANT
GAUZE SPONGE 4X4 12PLY STRL (GAUZE/BANDAGES/DRESSINGS) ×1 IMPLANT
GLOVE BIOGEL PI IND STRL 7.5 (GLOVE) ×1 IMPLANT
GLOVE BIOGEL PI IND STRL 8.5 (GLOVE) ×1 IMPLANT
GLOVE ORTHO TXT STRL SZ7.5 (GLOVE) ×1 IMPLANT
GLOVE SURG ORTHO 8.5 STRL (GLOVE) ×1 IMPLANT
GOWN STRL REUS W/ TWL XL LVL3 (GOWN DISPOSABLE) ×2 IMPLANT
HOLDER FOLEY CATH W/STRAP (MISCELLANEOUS) IMPLANT
IMMOBILIZER KNEE 20 (SOFTGOODS) ×1
IMMOBILIZER KNEE 20 THIGH 36 (SOFTGOODS) IMPLANT
KIT TURNOVER KIT A (KITS) IMPLANT
MANIFOLD NEPTUNE II (INSTRUMENTS) ×1 IMPLANT
NS IRRIG 1000ML POUR BTL (IV SOLUTION) ×1 IMPLANT
PACK TOTAL KNEE CUSTOM (KITS) ×1 IMPLANT
PATELLA MEDIAL ATTUN 35MM KNEE (Knees) IMPLANT
PIN STEINMAN FIXATION KNEE (PIN) IMPLANT
PROTECTOR NERVE ULNAR (MISCELLANEOUS) ×1 IMPLANT
SET HNDPC FAN SPRY TIP SCT (DISPOSABLE) ×1 IMPLANT
STRIP CLOSURE SKIN 1/2X4 (GAUZE/BANDAGES/DRESSINGS) ×2 IMPLANT
SUT MNCRL AB 3-0 PS2 18 (SUTURE) ×1 IMPLANT
SUT VIC AB 0 CT1 36 (SUTURE) ×1 IMPLANT
SUT VIC AB 1 CT1 36 (SUTURE) ×2 IMPLANT
SUT VIC AB 2-0 CT1 TAPERPNT 27 (SUTURE) ×1 IMPLANT
TIBIAL BASE ROT PLAT SZ 5 KNEE (Knees) ×1 IMPLANT
TRAY CATH INTERMITTENT SS 16FR (CATHETERS) ×1 IMPLANT
WATER STERILE IRR 1000ML POUR (IV SOLUTION) ×2 IMPLANT
YANKAUER SUCT BULB TIP NO VENT (SUCTIONS) ×1 IMPLANT

## 2023-03-12 NOTE — Op Note (Unsigned)
NAME: Beverly [name], POLITO MEDICAL RECORD NO: [id number] ACCOUNT NO: [id number] DATE OF BIRTH: 08/06/1945 FACILITY: [id number] PHYSICIAN: Almedia Balls. Ranell Patrick, MD  Operative Report   DATE OF PROCEDURE: 03/12/2023  PREOPERATIVE DIAGNOSIS:  Right knee end-stage arthritis.  POSTOPERATIVE DIAGNOSIS:  Right knee end-stage arthritis.  PROCEDURE PERFORMED:  Right total knee arthroplasty using DePuy Attune prosthesis.   ATTENDING SURGEON:  Almedia Balls. Ranell Patrick, MD.  ASSISTANT:  Konrad Felix Dixon, [place], who was scrubbed during the entire procedure, and necessary for satisfactory completion of surgery.   ANESTHESIA:  Spinal anesthesia was used plus adductor canal block.   ESTIMATED BLOOD LOSS:  Minimal.  FLUID REPLACEMENT: 1000 mL crystalloid.  COUNTS:  Instrument count was correct.   COMPLICATIONS:  There were no complications.  ANTIBIOTICS: Perioperative antibiotics were given.  TOURNIQUET TIME: 80 minutes at 300 mmHg.  INDICATIONS: The patient is a 78 year old female who presents with worsening right knee pain and worsening valgus deformity secondary to end-stage arthritis, bone-on-bone. The patient has failed an extended period of conservative management and desires  operative treatment to eliminate pain and restore function. Informed consent was obtained.  DESCRIPTION OF PROCEDURE:  After an adequate level of anesthesia was achieved, the patient was positioned in the supine position.  A nonsterile tourniquet was placed on the right proximal leg.  The right leg was sterilely prepped and draped in the usual  manner. Timeout called, verifying correct patient and correct site. We elevated the leg and exsanguinated with using an Esmarch bandage, inflating the tourniquet to 300 mmHg. We placed the knee in flexion and performed a longitudinal midline incision  with a #10 blade scalpel. We used a fresh #10 blade for the medial parapatellar arthrotomy. We divided lateral patellofemoral  ligaments, everting the patella and exposing the distal femur. The patient had extensive full-thickness cartilage loss in the  lateral compartment. We entered the distal femur with a step-cut drill. We then placed our intramedullary guide and resected 10 mm off the distal femur, set on 3 degrees of valgus for this slightly valgus knee with flexion contracture. We then went ahead  and sized her femur to a size 6 anterior down. We performed anterior-posterior cuts and chamfer cuts with a 4-in-1 block. We then removed ACL/PCL meniscal tissue subluxing the tibia anteriorly. We then used the external jig and cut the tibia at 90  degrees perpendicular to the long axis of the tibia with a minimal posterior slope for this posterior cruciate substituting prosthesis. We placed a lamina spreader. We then removed excess posterior femoral condyle osteophytes. We also released the  posterior capsule. Next, we went ahead and checked our gaps, which were fine in flexion, but she was too tight in extension despite resecting 10 mm off, so we went back and cut the distal femur 2 more mm using the [facility] and the distal cutting guide.  We recut the chamfer cuts. We then retrialed and after extensive release of the capsule and the popliteus off the tibia and the femur, we were able to get the knee to full extension with 5-mm poly in place. We went ahead and ____ 5-mm gap checker. At  this point, we went ahead and completed our tibial preparation with a modular drill and keel punch. Once we had the tibial preparation done, externally rotating the component as much as possible for patellar tracking we did cut the box for the 6 right  femur. We then trialed with the 5-mm poly and that gave [place] good  flexion and extension gap symmetry. Next, we resurfaced the patella going from a 24-mm thickness down to 15 mm thickness. We cut with an oscillating saw using the patellar cutting guide,  drilled lug holes for the 35 patellar button,  and then placed the trial button in place and arranged the knee and had excellent patellar tracking with just some slight tilt. We went ahead and released the lateral retinaculum and that improved tracking,  which was basically perfect with no touch technique. We removed the trial components, irrigated thoroughly, vacuum mixed high viscosity cement on the back table, and then cemented the components after drying the bone well, all in one step, tibia, femur,  and patella. We placed the knee in extension with 5-mm poly trial.  While the cement was set up for good compression and also used the patellar clamp for the patella while the cement was hardening. Once the cement was set up, we injected the anterior  capsule with a combination of Marcaine, Exparel, and saline. We also injected the posterior capsule with a combination of Marcaine, Exparel, and saline as well prior to implantation. Once we removed all excess cement with quarter-inch curved osteotome  and once we had done that, we went ahead and irrigated again and then selected our real poly, which was the size 6, 5-mm poly. We placed that on the tibia and reduced the knee. Had nice little pop as it reduced and able to get full extension with a  little bit of difficulty, but we were able to do it. So, after this, we irrigated again and then went ahead and closed the knee in slight flexion with #1 Vicryl suture for the retinacular deep closure. Then we did 2-0 closure for the subcutaneous and  some 0 proximally and then 4-0 running Monocryl for skin. Steri-Strips were applied followed by a sterile dressing. The patient tolerated the surgery well.   CHR D: 03/12/2023 4:29:49 pm T: 03/12/2023 7:02:00 pm  JOB: [id number]/ [id number]

## 2023-03-12 NOTE — Discharge Instructions (Signed)
Ice to the knee constantly.  Keep the incision covered and clean and dry for one week, then ok to get it wet in the shower. Remove your bandage after one week and leave open to air  Do exercise as instructed every hour, please to prevent stiffness.    DO NOT prop anything under the knee, it will make your knee stiff.  Prop under the ankle to encourage your knee to go straight. You will need to use that Blue Bone Foam cradle and work on extension several times per day.  Use the walker while you are up and around for balance.  Wear your support stockings or compression socks 24/7 to prevent blood clots and take baby aspirin twice daily for 30 days also to prevent blood clots. This can be taken with your Plavix  Follow up with Dr Ranell Patrick in two weeks in the office, call [phone] for appt  Please call Dr Ranell Patrick (cell) [phone] with any questions or concerns  INSTRUCTIONS AFTER JOINT REPLACEMENT   Remove items at home which could result in a fall. This includes throw rugs or furniture in walking pathways ICE to the affected joint every three hours while awake for 30 minutes at a time, for at least the first 3-5 days, and then as needed for pain and swelling.  Continue to use ice for pain and swelling. You may notice swelling that will progress down to the foot and ankle.  This is normal after surgery.  Elevate your leg when you are not up walking on it.   Continue to use the breathing machine you got in the hospital (incentive spirometer) which will help keep your temperature down.  It is common for your temperature to cycle up and down following surgery, especially at night when you are not up moving around and exerting yourself.  The breathing machine keeps your lungs expanded and your temperature down.   DIET:  As you were doing prior to hospitalization, we recommend a well-balanced diet.  DRESSING / WOUND CARE / SHOWERING  Leave the Aquacel dressing on for one week, then ok to remove and  get the wound wet. Keep the incision area dry for the first week.   ACTIVITY  Increase activity slowly as tolerated, but follow the weight bearing instructions below.   No driving for 6 weeks or until further direction given by your physician.  You cannot drive while taking narcotics.  No lifting or carrying greater than 10 lbs. until further directed by your surgeon. Avoid periods of inactivity such as sitting longer than an hour when not asleep. This helps prevent blood clots.  You may return to work once you are authorized by your doctor.     WEIGHT BEARING   Weight bearing as tolerated with assist device (walker, cane, etc) as directed, use it as long as suggested by your surgeon or therapist, typically at least 4-6 weeks.   EXERCISES  Results after joint replacement surgery are often greatly improved when you follow the exercise, range of motion and muscle strengthening exercises prescribed by your doctor. Safety measures are also important to protect the joint from further injury. Any time any of these exercises cause you to have increased pain or swelling, decrease what you are doing until you are comfortable again and then slowly increase them. If you have problems or questions, call your caregiver or physical therapist for advice.   Rehabilitation is important following a joint replacement. After just a few days of immobilization,  the muscles of the leg can become weakened and shrink (atrophy).  These exercises are designed to build up the tone and strength of the thigh and leg muscles and to improve motion. Often times heat used for twenty to thirty minutes before working out will loosen up your tissues and help with improving the range of motion but do not use heat for the first two weeks following surgery (sometimes heat can increase post-operative swelling).   These exercises can be done on a training (exercise) mat, on the floor, on a table or on a bed. Use whatever works the  best and is most comfortable for you.    Use music or television while you are exercising so that the exercises are a pleasant break in your day. This will make your life better with the exercises acting as a break in your routine that you can look forward to.   Perform all exercises about fifteen times, three times per day or as directed.  You should exercise both the operative leg and the other leg as well.  Exercises include:   Quad Sets - Tighten up the muscle on the front of the thigh (Quad) and hold for 5-10 seconds.   Straight Leg Raises - With your knee straight (if you were given a brace, keep it on), lift the leg to 60 degrees, hold for 3 seconds, and slowly lower the leg.  Perform this exercise against resistance later as your leg gets stronger.  Leg Slides: Lying on your back, slowly slide your foot toward your buttocks, bending your knee up off the floor (only go as far as is comfortable). Then slowly slide your foot back down until your leg is flat on the floor again.  Angel Wings: Lying on your back spread your legs to the side as far apart as you can without causing discomfort.  Hamstring Strength:  Lying on your back, push your heel against the floor with your leg straight by tightening up the muscles of your buttocks.  Repeat, but this time bend your knee to a comfortable angle, and push your heel against the floor.  You may put a pillow under the heel to make it more comfortable if necessary.   A rehabilitation program following joint replacement surgery can speed recovery and prevent re-injury in the future due to weakened muscles. Contact your doctor or a physical therapist for more information on knee rehabilitation.    CONSTIPATION  Constipation is defined medically as fewer than three stools per week and severe constipation as less than one stool per week.  Even if you have a regular bowel pattern at home, your normal regimen is likely to be disrupted due to multiple reasons  following surgery.  Combination of anesthesia, postoperative narcotics, change in appetite and fluid intake all can affect your bowels.   YOU MUST use at least one of the following options; they are listed in order of increasing strength to get the job done.  They are all available over the counter, and you may need to use some, POSSIBLY even all of these options:    Drink plenty of fluids (prune juice may be helpful) and high fiber foods Colace 100 mg by mouth twice a day  Senokot for constipation as directed and as needed Dulcolax (bisacodyl), take with full glass of water  Miralax (polyethylene glycol) once or twice a day as needed.  If you have tried all these things and are unable to have a bowel movement in the  first 3-4 days after surgery call either your surgeon or your primary doctor.    If you experience loose stools or diarrhea, hold the medications until you stool forms back up.  If your symptoms do not get better within 1 week or if they get worse, check with your doctor.  If you experience "the worst abdominal pain ever" or develop nausea or vomiting, please contact the office immediately for further recommendations for treatment.   ITCHING:  If you experience itching with your medications, try taking only a single pain pill, or even half a pain pill at a time.  You can also use Benadryl over the counter for itching or also to help with sleep.   TED HOSE STOCKINGS:  Use stockings on both legs until for at least 2 weeks or as directed by physician office. They may be removed at night for sleeping.  MEDICATIONS:  See your medication summary on the "After Visit Summary" that nursing will review with you.  You may have some home medications which will be placed on hold until you complete the course of blood thinner medication.  It is important for you to complete the blood thinner medication as prescribed.  PRECAUTIONS:  If you experience chest pain or shortness of breath - call 911  immediately for transfer to the hospital emergency department.   If you develop a fever greater that 101 F, purulent drainage from wound, increased redness or drainage from wound, foul odor from the wound/dressing, or calf pain - CONTACT YOUR SURGEON.                                                   FOLLOW-UP APPOINTMENTS:  If you do not already have a post-op appointment, please call the office for an appointment to be seen by your surgeon.  Guidelines for how soon to be seen are listed in your "After Visit Summary", but are typically between 1-4 weeks after surgery.  OTHER INSTRUCTIONS:   Knee Replacement:  Do not place pillow under knee, focus on keeping the knee straight while resting. CPM instructions: 0-90 degrees, 2 hours in the morning, 2 hours in the afternoon, and 2 hours in the evening. Place foam block, curve side up under heel at all times except when in CPM or when walking.  DO NOT modify, tear, cut, or change the foam block in any way.  POST-OPERATIVE OPIOID TAPER INSTRUCTIONS: It is important to wean off of your opioid medication as soon as possible. If you do not need pain medication after your surgery it is ok to stop day one. Opioids include: Codeine, Hydrocodone(Norco, Vicodin), Oxycodone(Percocet, oxycontin) and hydromorphone amongst others.  Long term and even short term use of opiods can cause: Increased pain response Dependence Constipation Depression Respiratory depression And more.  Withdrawal symptoms can include Flu like symptoms Nausea, vomiting And more Techniques to manage these symptoms Hydrate well Eat regular healthy meals Stay active Use relaxation techniques(deep breathing, meditating, yoga) Do Not substitute Alcohol to help with tapering If you have been on opioids for less than two weeks and do not have pain than it is ok to stop all together.  Plan to wean off of opioids This plan should start within one week post op of your joint  replacement. Maintain the same interval or time between taking each dose and first decrease  the dose.  Cut the total daily intake of opioids by one tablet each day Next start to increase the time between doses. The last dose that should be eliminated is the evening dose.   MAKE SURE YOU:  Understand these instructions.  Get help right away if you are not doing well or get worse.    Thank you for letting us be a part of your medical care team.  It is a privilege we respect greatly.  We hope these instructions will help you stay on track for a fast and full recovery!

## 2023-03-12 NOTE — Anesthesia Procedure Notes (Signed)
Spinal  Patient location during procedure: OR Start time: 03/12/2023 2:31 PM End time: 03/12/2023 2:34 PM Reason for block: surgical anesthesia Staffing Performed: anesthesiologist  Anesthesiologist: Mal Amabile, MD Performed by: Mal Amabile, MD Authorized by: Mal Amabile, MD   Preanesthetic Checklist Completed: patient identified, IV checked, site marked, risks and benefits discussed, surgical consent, monitors and equipment checked, pre-op evaluation and timeout performed Spinal Block Patient position: sitting Prep: DuraPrep and site prepped and draped Patient monitoring: heart rate, cardiac monitor, continuous pulse ox and blood pressure Approach: midline Location: L3-4 Injection technique: single-shot Needle Needle type: Pencan  Needle gauge: 24 G Needle length: 9 cm Needle insertion depth: 7 cm Assessment Sensory level: T6 Events: CSF return Additional Notes Patient tolerated procedure well. Adequate sensory level.

## 2023-03-12 NOTE — Anesthesia Procedure Notes (Signed)
Anesthesia Regional Block: Adductor canal block   Pre-Anesthetic Checklist: , timeout performed,  Correct Patient, Correct Site, Correct Laterality,  Correct Procedure, Correct Position, site marked,  Risks and benefits discussed,  Surgical consent,  Pre-op evaluation,  At surgeon's request and post-op pain management  Laterality: Right  Prep: chloraprep       Needles:  Injection technique: Single-shot  Needle Type: Echogenic Stimulator Needle     Needle Length: 10cm  Needle Gauge: 21   Needle insertion depth: 7 cm   Additional Needles:   Procedures:,,,, ultrasound used (permanent image in chart),,    Narrative:  Start time: 03/12/2023 1:55 PM End time: 03/12/2023 2:00 PM Injection made incrementally with aspirations every 5 mL.  Performed by: Personally  Anesthesiologist: Mal Amabile, MD  Additional Notes: Timeout performed. Patient sedated. Relevant anatomy ID'd using [place]. Incremental 2-73ml injection of LA with frequent aspiration. Patient tolerated procedure well.

## 2023-03-12 NOTE — Brief Op Note (Signed)
03/12/2023  4:21 PM  PATIENT:  Beverly [name]  78 y.o. female  PRE-OPERATIVE DIAGNOSIS:  Right knee osteoarthritis, end stage  POST-OPERATIVE DIAGNOSIS:  Right knee osteoarthritis, end stage  PROCEDURE:  Procedure(s): TOTAL KNEE ARTHROPLASTY (Right) DePuy Attune  SURGEON:  Surgeons and Role:    [name], MD - Primary  PHYSICIAN ASSISTANT:   ASSISTANTS: Thea Gist, PA-C   ANESTHESIA:   regional and spinal  EBL:  25 mL   BLOOD ADMINISTERED:none  DRAINS: none   LOCAL MEDICATIONS USED:  MARCAINE     SPECIMEN:  No Specimen  DISPOSITION OF SPECIMEN:  N/A  COUNTS:  YES  TOURNIQUET:  80 minutes at 300 mm Hg  DICTATION: .Other Dictation: Dictation Number [id number]  PLAN OF CARE: Admit for overnight observation  PATIENT DISPOSITION:  PACU - hemodynamically stable.   Delay start of Pharmacological VTE agent (>24hrs) due to surgical blood loss or risk of bleeding: no

## 2023-03-12 NOTE — Transfer of Care (Signed)
Immediate Anesthesia Transfer of Care Note  Patient: Beverly [name]  Procedure(s) Performed: TOTAL KNEE ARTHROPLASTY (Right: Knee)  Patient Location: PACU  Anesthesia Type:Spinal  Level of Consciousness: awake and patient cooperative  Airway & Oxygen Therapy: Patient Spontanous Breathing and Patient connected to face mask  Post-op Assessment: Report given to RN and Post -op Vital signs reviewed and stable  Post vital signs: Reviewed and stable  Last Vitals:  Vitals Value Taken Time  BP 122/57 03/12/23 1635  Temp    Pulse 55 03/12/23 1638  Resp 20 03/12/23 1638  SpO2 94 % 03/12/23 1638  Vitals shown include unfiled device data.  Last Pain:  Vitals:   03/12/23 1402  TempSrc:   PainSc: 0-No pain      Patients Stated Pain Goal: 6 (03/12/23 1212)  Complications: No notable events documented.

## 2023-03-12 NOTE — Progress Notes (Signed)
Orthopedic Tech Progress Note Patient Details:  Beverly [name] 1946-01-03 [id number] CPM will be removed at 9pm.  CPM Right Knee CPM Right Knee: On Right Knee Flexion (Degrees): 90 Right Knee Extension (Degrees): 0  Post Interventions Patient Tolerated: Well Ortho Devices Type of Ortho Device: Bone foam zero knee Ortho Device/Splint Location: Right knee Ortho Device/Splint Interventions: Application   Post Interventions Patient Tolerated: Well  Beverly [name] 03/12/2023, 5:16 PM

## 2023-03-12 NOTE — Interval H&P Note (Signed)
History and Physical Interval Note:  03/12/2023 1:57 PM  Beverly [name]  has presented today for surgery, with the diagnosis of Right knee osteoarthritis.  The various methods of treatment have been discussed with the patient and family. After consideration of risks, benefits and other options for treatment, the patient has consented to  Procedure(s) with comments: TOTAL KNEE ARTHROPLASTY (Right) - general anesthesia , please put shoulder scope to follow as a surgical intervention.  The patient's history has been reviewed, patient examined, no change in status, stable for surgery.  I have reviewed the patient's chart and labs.  Questions were answered to the patient's satisfaction.     Verlee Rossetti

## 2023-03-12 NOTE — Anesthesia Postprocedure Evaluation (Signed)
Anesthesia Post Note  Patient: Beverly [name]  Procedure(s) Performed: TOTAL KNEE ARTHROPLASTY (Right: Knee)     Patient location during evaluation: PACU Anesthesia Type: Spinal Level of consciousness: oriented and awake and alert Pain management: pain level controlled Vital Signs Assessment: post-procedure vital signs reviewed and stable Respiratory status: spontaneous breathing, respiratory function stable and nonlabored ventilation Cardiovascular status: blood pressure returned to baseline and stable Postop Assessment: no headache, no backache, no apparent nausea or vomiting and patient able to bend at knees Anesthetic complications: no   No notable events documented.  Last Vitals:  Vitals:   03/12/23 1815 03/12/23 1841  BP: (!) 163/64 (!) 166/74  Pulse: (!) 50 (!) 54  Resp: 17 16  Temp:  36.7 C  SpO2: 97% 96%    Last Pain:  Vitals:   03/12/23 1903  TempSrc:   PainSc: 5                  Pamela Maddy A.

## 2023-03-13 ENCOUNTER — Other Ambulatory Visit (HOSPITAL_COMMUNITY): Payer: Self-pay

## 2023-03-13 DIAGNOSIS — Z7722 Contact with and (suspected) exposure to environmental tobacco smoke (acute) (chronic): Secondary | ICD-10-CM | POA: Diagnosis not present

## 2023-03-13 DIAGNOSIS — E039 Hypothyroidism, unspecified: Secondary | ICD-10-CM | POA: Diagnosis not present

## 2023-03-13 DIAGNOSIS — Z96611 Presence of right artificial shoulder joint: Secondary | ICD-10-CM | POA: Diagnosis not present

## 2023-03-13 DIAGNOSIS — Z79899 Other long term (current) drug therapy: Secondary | ICD-10-CM | POA: Diagnosis not present

## 2023-03-13 DIAGNOSIS — Z85828 Personal history of other malignant neoplasm of skin: Secondary | ICD-10-CM | POA: Diagnosis not present

## 2023-03-13 DIAGNOSIS — M1711 Unilateral primary osteoarthritis, right knee: Secondary | ICD-10-CM | POA: Diagnosis not present

## 2023-03-13 DIAGNOSIS — J449 Chronic obstructive pulmonary disease, unspecified: Secondary | ICD-10-CM | POA: Diagnosis not present

## 2023-03-13 DIAGNOSIS — I2510 Atherosclerotic heart disease of native coronary artery without angina pectoris: Secondary | ICD-10-CM | POA: Diagnosis not present

## 2023-03-13 DIAGNOSIS — Z955 Presence of coronary angioplasty implant and graft: Secondary | ICD-10-CM | POA: Diagnosis not present

## 2023-03-13 LAB — BASIC METABOLIC PANEL
Anion gap: 10 (ref 5–15)
BUN: 11 mg/dL (ref 8–23)
CO2: 26 mmol/L (ref 22–32)
Calcium: 8.9 mg/dL (ref 8.9–10.3)
Chloride: 104 mmol/L (ref 98–111)
Creatinine, Ser: 0.65 mg/dL (ref 0.44–1.00)
GFR, Estimated: >60 mL/min (ref >60)
Glucose, Bld: 137 mg/dL — ABNORMAL HIGH (ref 70–99)
Potassium: 4.3 mmol/L (ref 3.5–5.1)
Sodium: 140 mmol/L (ref 135–145)

## 2023-03-13 LAB — CBC
HCT: 36.1 % (ref 36.0–46.0)
Hemoglobin: 12.1 g/dL (ref 12.0–15.0)
MCH: 33 pg (ref 26.0–34.0)
MCHC: 33.5 g/dL (ref 30.0–36.0)
MCV: 98.4 fL (ref 80.0–100.0)
Platelets: 142 10*3/uL — ABNORMAL LOW (ref 150–400)
RBC: 3.67 MIL/uL — ABNORMAL LOW (ref 3.87–5.11)
RDW: 14.4 % (ref 11.5–15.5)
WBC: 9.1 10*3/uL (ref 4.0–10.5)
nRBC: 0 % (ref 0.0–0.2)

## 2023-03-13 NOTE — Progress Notes (Signed)
Orthopedics Progress Note  Subjective: Pain well controlled still with the block  Objective:  Vitals:   03/13/23 0145 03/13/23 0548  BP: 129/63 137/65  Pulse: (!) 57 (!) 53  Resp: 17 17  Temp: 98.4 F (36.9 C) 98.4 F (36.9 C)  SpO2: 95% 98%    General: Awake and alert  Musculoskeletal: dressing changed to Aquacel. Moderate swelling and some bruising noted.  Neurovascularly intact  Lab Results  Component Value Date   WBC 9.1 03/13/2023   HGB 12.1 03/13/2023   HCT 36.1 03/13/2023   MCV 98.4 03/13/2023   PLT 142 (L) 03/13/2023       Component Value Date/Time   NA 140 03/13/2023 0416   NA 144 01/26/2023 1218   K 4.3 03/13/2023 0416   CL 104 03/13/2023 0416   CO2 26 03/13/2023 0416   GLUCOSE 137 (H) 03/13/2023 0416   BUN 11 03/13/2023 0416   BUN 10 01/26/2023 1218   CREATININE 0.65 03/13/2023 0416   CREATININE 0.73 02/04/2021 1122   CALCIUM 8.9 03/13/2023 0416   GFRNONAA >60 03/13/2023 0416   GFRAA >60 06/22/2019 0917    No results found for: "INR", "PROTIME"  Assessment/Plan: POD #1 s/p Procedure(s): TOTAL KNEE ARTHROPLASTY Stable overnight. Plan discharge to home after therapy.  Follow up in two weeks in the office  Viviann Spare R. Ranell Patrick, MD 03/13/2023 8:34 AM

## 2023-03-13 NOTE — Progress Notes (Signed)
Orthopedics Progress Note  Subjective: Patient reports chin pain yesterday that has resolved and knee pain this AM which has responded to po meds  Objective:  Vitals:   03/13/23 0145 03/13/23 0548  BP: 129/63 137/65  Pulse: (!) 57 (!) 53  Resp: 17 17  Temp: 98.4 F (36.9 C) 98.4 F (36.9 C)  SpO2: 95% 98%    General: Awake and alert  Musculoskeletal: Right knee incision looks good with mild leg swelling. Aquacel dressing applied Neurovascularly intact  Lab Results  Component Value Date   WBC 9.1 03/13/2023   HGB 12.1 03/13/2023   HCT 36.1 03/13/2023   MCV 98.4 03/13/2023   PLT 142 (L) 03/13/2023       Component Value Date/Time   NA 140 03/13/2023 0416   NA 144 01/26/2023 1218   K 4.3 03/13/2023 0416   CL 104 03/13/2023 0416   CO2 26 03/13/2023 0416   GLUCOSE 137 (H) 03/13/2023 0416   BUN 11 03/13/2023 0416   BUN 10 01/26/2023 1218   CREATININE 0.65 03/13/2023 0416   CREATININE 0.73 02/04/2021 1122   CALCIUM 8.9 03/13/2023 0416   GFRNONAA >60 03/13/2023 0416   GFRAA >60 06/22/2019 0917    No results found for: "INR", "PROTIME"  Assessment/Plan: POD #1 s/p Procedure(s): TOTAL KNEE ARTHROPLASTY Stable this AM after TKR. Up with therapy and home today once she clears PT Follow up in two weeks in the office  Viviann Spare R. Ranell Patrick, MD 03/13/2023 8:23 AM

## 2023-03-13 NOTE — Care Management Obs Status (Signed)
MEDICARE OBSERVATION STATUS NOTIFICATION   Patient Details  Name: SENA HOOPINGARNER MRN: [id number] Date of Birth: Jul 04, 1945   Medicare Observation Status Notification Given:  Yes  This LCSW explained MOON to patient; Cheila Wickstrom verbalized understanding, and signed document; copy of document given to patient.   Otelia Santee, LCSW 03/13/2023, 11:42 AM

## 2023-03-13 NOTE — Plan of Care (Signed)
  Problem: Coping: Goal: Level of anxiety will decrease Outcome: Progressing   Problem: Pain Managment: Goal: General experience of comfort will improve and/or be controlled Outcome: Progressing   Problem: Safety: Goal: Ability to remain free from injury will improve Outcome: Progressing

## 2023-03-13 NOTE — Progress Notes (Signed)
   03/13/23 1143  TOC Brief Assessment  Insurance and Status Reviewed  Patient has primary care physician Yes  Home environment has been reviewed Private  Prior level of function: Idependent/modified independent  Prior/Current Home Services No current home services  Social Drivers of Health Review SDOH reviewed no interventions necessary  Readmission risk has been reviewed Yes  Transition of care needs no transition of care needs at this time   Pt to return home with HEP. No DME needs identified.

## 2023-03-13 NOTE — Discharge Summary (Signed)
In most cases prophylactic antibiotics for Dental procdeures after total joint surgery are not necessary.  Exceptions are as follows:  1. History of prior total joint infection  2. Severely immunocompromised (Organ Transplant, cancer chemotherapy, Rheumatoid biologic meds such as Humera)  3. Poorly controlled diabetes (A1C &gt; 8.0, blood glucose over 200)  If you have one of these conditions, contact your surgeon for an antibiotic prescription, prior to your dental procedure. Orthopedic Discharge Summary        Physician Discharge Summary  Patient ID: Beverly [name] MRN: [id number] DOB/AGE: [birth year] 78 y.o.  Admit date: 03/12/2023 Discharge date: 03/13/2023   Procedures:  Procedure(s) (LRB): TOTAL KNEE ARTHROPLASTY (Right)  Attending Physician:  Dr. 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   right sholder   BCC (basal cell carcinoma of skin) 05/09/2014   mid lower back   BCC (basal cell carcinoma of skin) 05/03/2017   right low back   BCC (basal cell carcinoma of skin) 05/03/2017   left upper back   BCC (basal cell carcinoma of skin) 07/05/2018   left mid back   BCC (basal cell carcinoma of skin) 05/20/1992   upper back   BCC (basal cell carcinoma of skin) 07/29/1993   left sholder medial   BCC (basal cell carcinoma of skin) 07/29/1993   left sholder lateral   BCC (basal cell carcinoma of skin) 07/29/1993   right thigh   BCC (basal cell carcinoma of skin) 07/29/1993   right sholder   BCC (basal cell carcinoma of skin) 12/22/1994   right mid forearm   BCC (basal cell carcinoma of skin) 12/22/1994   right  upper forearm   BCC (basal cell carcinoma of skin) 12/22/1994   lower right upper arm   BCC (basal cell carcinoma of skin) 12/22/1994   right upper arm sholder   BCC (basal cell carcinoma of skin) 08/11/1995   left leg below knee   BCC (basal cell carcinoma of skin) 04/11/2002   mid back   BCC (basal cell carcinoma of skin) 12/10/2002   right center upper back   BCC (basal cell carcinoma of skin) 05/26/2005   right post sholder   BCC (basal cell carcinoma) 05/09/2014   left inner shin   BCC (basal cell carcinoma) 06/12/2014   left forearm   Blood transfusion without reported diagnosis 11/06/21   Bowen's disease 10/07/1994   right post knee, right inner forearm/wrist   Bowen's disease 08/11/1995   left sholder   Bronchiectasis (HCC)    Cataract    left   Chronic pain    Common variable immunodeficiency [facility])    sees dr Ellin Goodie 02-11-2021 epic   COPD (chronic obstructive pulmonary disease) (HCC) 2022   Brochiectosis / tracheobrocheomalacia   Coronary artery disease    Depression    hx of   Diverticulosis of colon 1998   mild   DJD (degenerative joint disease)    Duodenal ulcer    h/o yrs ago   Dysrhythmia    GERD (gastroesophageal reflux disease)    Heart murmur  1949   History of SCC (squamous cell carcinoma) of skin    Dr. Jorja Loa   History of sinus bradycardia    HLD (hyperlipidemia)    hypertriglyceridemia   Hypertensive retinopathy of both eyes 11/18/2017   Hypothyroidism    IBS (irritable bowel syndrome)    Internal hemorrhoids 1998   Lymphocytic colitis    Mitral regurgitation    mild   Myocardial infarction [facility]) 2023   Cardiac CT 2023 - 2 cardiac stents placed   Nocardiosis    relased by infection disease dec 2022   Osteoarthritis    feet,shoulder,neck,back,hips and hands.   Pneumonia 2015   PONV (postoperative nausea and vomiting)    Rotator cuff tear, right 02/2019   infraspinatus and supraspinatus, and dislocation of long head of bicep tendon  (Dr. Charlann Boxer)   SCC (squamous cell carcinoma) 11/26/2014   left hand, right hand, right deltoid mnay areas   SCC (squamous cell carcinoma) 05/03/2017   left cheek   Sleep apnea    uses a mouth guard nightly   Squamous cell carcinoma in situ (SCCIS) 07/05/2018   left hand   Tracheobronchomalacia    Trigger finger    left hand ring finger   Vitamin D deficiency    mild    PCP: [name], Limmie Patricia, MD   Discharged Condition: good  Hospital Course:  Patient underwent the above stated procedure on 03/12/2023. Patient tolerated the procedure well and brought to the recovery room in good condition and subsequently to the floor. Patient had an uncomplicated hospital course and was stable for discharge.   Disposition: Discharge disposition: 01-Home or Self Care      with follow up in 2 weeks    Follow-up Information     [name], MD. Call in 2 week(s).   Specialty: Orthopedic Surgery Why: call [phone] for appt in two weeks Contact information: [address] [place] [address] [phone]                 Dental Antibiotics:  In most cases prophylactic antibiotics for Dental procdeures after total joint surgery are not necessary.  Exceptions are as follows:  1. History of prior total joint infection  2. Severely immunocompromised (Organ Transplant, cancer chemotherapy, Rheumatoid biologic meds such as Humera)  3. Poorly controlled diabetes (A1C &gt; 8.0, blood glucose over 200)  If you have one of these conditions, contact your surgeon for an antibiotic prescription, prior to your dental procedure.  Discharge Instructions     Call MD / Call 911   Complete by: As directed    If you experience chest pain or shortness of breath, CALL 911 and be transported to the hospital emergency room.  If you develope a fever above 101 F, pus (white drainage) or increased drainage or redness at the wound, or calf pain, call your surgeon's  office.   Constipation Prevention   Complete by: As directed    Drink plenty of fluids.  Prune juice may be helpful.  You may use a stool softener, such as Colace (over the counter) 100 mg twice a day.  Use MiraLax (over the counter) for constipation as needed.   Diet - low sodium heart healthy   Complete by: As directed    Increase activity slowly as tolerated   Complete by: As directed    Post-operative opioid taper instructions:   Complete by: As directed    POST-OPERATIVE OPIOID TAPER INSTRUCTIONS: It is important to wean off  of your opioid medication as soon as possible. If you do not need pain medication after your surgery it is ok to stop day one. Opioids include: Codeine, Hydrocodone(Norco, Vicodin), Oxycodone(Percocet, oxycontin) and hydromorphone amongst others.  Long term and even short term use of opiods can cause: Increased pain response Dependence Constipation Depression Respiratory depression And more.  Withdrawal symptoms can include Flu like symptoms Nausea, vomiting And more Techniques to manage these symptoms Hydrate well Eat regular healthy meals Stay active Use relaxation techniques(deep breathing, meditating, yoga) Do Not substitute Alcohol to help with tapering If you have been on opioids for less than two weeks and do not have pain than it is ok to stop all together.  Plan to wean off of opioids This plan should start within one week post op of your joint replacement. Maintain the same interval or time between taking each dose and first decrease the dose.  Cut the total daily intake of opioids by one tablet each day Next start to increase the time between doses. The last dose that should be eliminated is the evening dose.          Allergies as of 03/13/2023       Reactions   Other Other (See Comments)   Adhesive [tape] Rash   Blister    Codeine Rash        Medication List     TAKE these medications    acetaminophen 650 MG CR  tablet Commonly known as: TYLENOL Take 650-1,300 mg by mouth every 8 (eight) hours as needed for pain.   albuterol 108 (90 Base) MCG/ACT inhaler Commonly known as: Ventolin HFA Inhale 2 puffs into the lungs every 4 (four) hours as needed for wheezing or shortness of breath.   ARTIFICIAL TEAR OP Place 2 drops into both eyes in the morning and at bedtime.   aspirin 81 MG chewable tablet Commonly known as: Aspirin Childrens Chew 1 tablet (81 mg total) by mouth 2 (two) times daily with a meal.   atorvastatin 40 MG tablet Commonly known as: LIPITOR Take 1 tablet (40 mg total) by mouth daily.   Belbuca 750 MCG Film Generic drug: Buprenorphine HCl Place 750 mcg inside cheek 2 (two) times daily.   budesonide 3 MG 24 hr capsule Commonly known as: ENTOCORT EC Take 1 capsule (3 mg total) by mouth daily.   Calcium Carbonate-Vitamin D 600-200 MG-UNIT Tabs Take 1 tablet by mouth 2 (two) times daily.   clopidogrel 75 MG tablet Commonly known as: PLAVIX Take 1 tablet (75 mg total) by mouth daily.   Culturelle Adult Ult Balance Caps Take 1 capsule by mouth daily.   diclofenac Sodium 1 % Gel Commonly known as: VOLTAREN Apply 2 g topically 4 (four) times daily as needed.   dicyclomine 10 MG capsule Commonly known as: BENTYL Take 1 capsule (10 mg total) by mouth 2 (two) times daily as needed for spasms.   docusate sodium 100 MG capsule Commonly known as: COLACE Take 1 capsule (100 mg total) by mouth in the morning and at bedtime. What changed:  how much to take when to take this   EPINEPHrine 0.3 mg/0.3 mL Soaj injection Commonly known as: EPI-PEN Inject 0.3 mg into the muscle as needed for anaphylaxis.   famotidine 40 MG tablet Commonly known as: Pepcid Take 1 tablet (40 mg total) by mouth at bedtime.   fluorouracil 5 % cream Commonly known as: Efudex Apply two times a day to areas on left leg for 3  weeks.   fluorouracil 5 % cream Commonly known as: Efudex Apply two  times a day to areas on hands for 2 weeks.   guaiFENesin 600 MG 12 hr tablet Commonly known as: MUCINEX Take 600 mg by mouth 2 (two) times daily.   HYDROcodone-acetaminophen 7.5-325 MG tablet Commonly known as: NORCO Take 1 tablet by mouth 4 (four) times daily as needed for severe pain (pain score 7-10). What changed: reasons to take this   ipratropium 0.06 % nasal spray Commonly known as: ATROVENT Place 2 sprays into both nostrils 4 (four) times daily as needed for rhinitis.   levocetirizine 5 MG tablet Commonly known as: XYZAL Take 1 tablet (5 mg total) by mouth every evening.   levothyroxine 25 MCG tablet Commonly known as: SYNTHROID Take 1 tablet (25 mcg total) by mouth daily.   loratadine 10 MG tablet Commonly known as: Claritin Take 1 tablet (10 mg total) by mouth daily as needed for allergies (Can take an extra dose during flare ups.).   melatonin 5 MG Tabs Take 5 mg by mouth at bedtime.   methocarbamol 500 MG tablet Commonly known as: ROBAXIN Take 1 tablet (500 mg total) by mouth 3 (three) times daily as needed. What changed: Another medication with the same name was changed. Make sure you understand how and when to take each.   methocarbamol 500 MG tablet Commonly known as: ROBAXIN Take 1 tablet (500 mg total) by mouth every 8 (eight) hours as needed for muscle spasms. What changed:  when to take this reasons to take this   montelukast 10 MG tablet Commonly known as: SINGULAIR Take 1 tablet (10 mg total) by mouth at bedtime.   MULTIVITAMIN ADULT PO Take 1 tablet by mouth daily.   nitroGLYCERIN 0.4 MG SL tablet Commonly known as: Nitrostat Place 1 tablet (0.4 mg total) under the tongue every 5 (five) minutes as needed.   omega-3 acid ethyl esters 1 g capsule Commonly known as: LOVAZA Take 2 capsules (2 g total) by mouth 2 (two) times daily.   pantoprazole 40 MG tablet Commonly known as: PROTONIX Take 1 tablet (40 mg total) by mouth in the morning.    polyethylene glycol 17 g packet Commonly known as: MIRALAX / GLYCOLAX Take 17 g by mouth daily.   pregabalin 50 MG capsule Commonly known as: Lyrica Take 1 capsule (50 mg total) by mouth every 12 (twelve) hours stop gabapentin   pregabalin 50 MG capsule Commonly known as: Lyrica Take 1 capsule (50 mg total) by mouth every 8 (eight) hours. STOP gabapentin.   psyllium 58.6 % packet Commonly known as: METAMUCIL Take 1 packet by mouth daily.   QC TUMERIC COMPLEX PO Take 1 capsule by mouth 2 (two) times daily.   TART CHERRY PO Take 1 capsule by mouth daily.   TUMS PO Take 1 tablet by mouth as needed (heartburn).   valACYclovir 500 MG tablet Commonly known as: Valtrex Take 1 tablet (500 mg total) by mouth daily.   venlafaxine XR 75 MG 24 hr capsule Commonly known as: Effexor XR Take 1 capsule (75 mg total) by mouth daily.   VITAMIN D-3 PO Take 1 tablet by mouth daily.   Xembify 10 GM/50ML Soln Generic drug: Immune Globulin (Human)-klhw Inject 10 g into the skin once a week. Wednesday          Signed: Verlee Rossetti 03/13/2023, 8:25 AM  [facility] is now [employer] [address], [address], [place], [place] [address]  Phone: [phone] Facebook  Instagram  [employer]

## 2023-03-13 NOTE — Progress Notes (Signed)
Physical Therapy Treatment Patient Details Name: Beverly [name] MRN: [id number] DOB: [birth year] Today's Date: 03/13/2023   History of Present Illness Pt s/p R TKR and with hx of CAD, COPD, DJD, and lumbar fusion    PT Comments  Pt continues very motivated and progressing well with mobility including up to ambulate in hall, negotiated stairs and reviewed written HEP.  Pt eager for dc home this date.    If plan is discharge home, recommend the following: A little help with walking and/or transfers;A little help with bathing/dressing/bathroom;Assistance with cooking/housework;Help with stairs or ramp for entrance;Assist for transportation   Can travel by private vehicle        Equipment Recommendations  None recommended by PT    Recommendations for Other Services       Precautions / Restrictions Precautions Precautions: Knee;Fall Restrictions Weight Bearing Restrictions Per Provider Order: No RLE Weight Bearing Per Provider Order: Weight bearing as tolerated     Mobility  Bed Mobility Overal bed mobility: Needs Assistance Bed Mobility: Supine to Sit     Supine to sit: Contact guard     General bed mobility comments: up in chair and returns to same    Transfers Overall transfer level: Needs assistance Equipment used: Rolling walker (2 wheels) Transfers: Sit to/from Stand Sit to Stand: Contact guard assist, Supervision           General transfer comment: cues for LE management and use of UEs to self assist    Ambulation/Gait Ambulation/Gait assistance: Contact guard assist, Supervision Gait Distance (Feet): 75 Feet Assistive device: Rolling walker (2 wheels) Gait Pattern/deviations: Step-to pattern, Step-through pattern, Decreased step length - right, Decreased step length - left, Shuffle, Trunk flexed Gait velocity: decr     General Gait Details: cues for sequence, posture and position from RW   Stairs Stairs: Yes Stairs assistance: Min assist Stair  Management: No rails, Step to pattern, Backwards, Forwards, With walker Number of Stairs: 4 General stair comments: single step - twice fwd and twice bkwd;  cues for sequence   Wheelchair Mobility     Tilt Bed    Modified Rankin (Stroke Patients Only)       Balance Overall balance assessment: Mild deficits observed, not formally tested                                          Cognition Arousal: Alert Behavior During Therapy: WFL for tasks assessed/performed Overall Cognitive Status: Within Functional Limits for tasks assessed                                          Exercises Total Joint Exercises Ankle Circles/Pumps: AROM, Both, 15 reps, Supine Quad Sets: AROM, Both, 10 reps, Supine Heel Slides: AAROM, Right, 15 reps, Supine Straight Leg Raises: AAROM, AROM, Right, 10 reps, Supine Long Arc Quad: AAROM, Right, 10 reps, Seated    General Comments        Pertinent Vitals/Pain Pain Assessment Pain Assessment: 0-10 Pain Score: 5  Pain Location: R knee Pain Descriptors / Indicators: Aching, Sore Pain Intervention(s): Limited activity within patient's tolerance, Monitored during session, Premedicated before session    Home Living Family/patient expects to be discharged to:: Private residence Living Arrangements: Alone Available Help at Discharge: Friend(s);Available 24 hours/day Type of Home:  House Home Access: Stairs to enter Entrance Stairs-Rails: None Entrance Stairs-Number of Steps: 1   Home Layout: One level Home Equipment: Agricultural consultant (2 wheels);BSC/3in1;Cane - single point Additional Comments: Pt going to friends home and information above pertains to freinds home    Prior Function            PT Goals (current goals can now be found in the care plan section) Acute Rehab PT Goals Patient Stated Goal: Regain IND PT Goal Formulation: With patient Time For Goal Achievement: 03/19/23 Potential to Achieve Goals:  Good Progress towards PT goals: Progressing toward goals    Frequency    7X/week      PT Plan      Co-evaluation              AM-PAC PT "6 Clicks" Mobility   Outcome Measure  Help needed turning from your back to your side while in a flat bed without using bedrails?: A Little Help needed moving from lying on your back to sitting on the side of a flat bed without using bedrails?: A Little Help needed moving to and from a bed to a chair (including a wheelchair)?: A Little Help needed standing up from a chair using your arms (e.g., wheelchair or bedside chair)?: A Little Help needed to walk in hospital room?: A Little Help needed climbing 3-5 steps with a railing? : A Little 6 Click Score: 18    End of Session Equipment Utilized During Treatment: Gait belt Activity Tolerance: Patient tolerated treatment well Patient left: in chair;with call bell/phone within reach;with chair alarm set Nurse Communication: Mobility status PT Visit Diagnosis: Difficulty in walking, not elsewhere classified (R26.2)     Time: 1130-1155 PT Time Calculation (min) (ACUTE ONLY): 25 min  Charges:    $Gait Training: 8-22 mins $Therapeutic Exercise: 8-22 mins $Therapeutic Activity: 8-22 mins PT General Charges $$ ACUTE PT VISIT: 1 Visit                     Mauro Kaufmann PT Acute Rehabilitation Services Pager [phone] Office [phone]    Harshaan Whang 03/13/2023, 1:29 PM

## 2023-03-13 NOTE — Evaluation (Signed)
Physical Therapy Evaluation Patient Details Name: Beverly [name] MRN: [id number] DOB: 29-Jul-1945 Today's Date: 03/13/2023  History of Present Illness  Pt s/p R TKR and with hx of CAD, COPD, DJD, and lumbar fusion  Clinical Impression  Pt s/p R TKR and presents with functional mobility limitations 2* decreased R LE strength/ROM and post op pain.  Pt should progress to dc home with family assist and first OP PT scheduled for 03/15/23.        If plan is discharge home, recommend the following: A little help with walking and/or transfers;A little help with bathing/dressing/bathroom;Assistance with cooking/housework;Help with stairs or ramp for entrance;Assist for transportation   Can travel by private vehicle        Equipment Recommendations None recommended by PT  Recommendations for Other Services       Functional Status Assessment Patient has had a recent decline in their functional status and demonstrates the ability to make significant improvements in function in a reasonable and predictable amount of time.     Precautions / Restrictions Precautions Precautions: Knee;Fall Restrictions Weight Bearing Restrictions Per Provider Order: No RLE Weight Bearing Per Provider Order: Weight bearing as tolerated      Mobility  Bed Mobility Overal bed mobility: Needs Assistance Bed Mobility: Supine to Sit     Supine to sit: Contact guard     General bed mobility comments: Increased time and CGA for safety    Transfers Overall transfer level: Needs assistance Equipment used: Rolling walker (2 wheels) Transfers: Sit to/from Stand Sit to Stand: Min assist, Contact guard assist           General transfer comment: cues for LE management and use of UEs to self assist    Ambulation/Gait Ambulation/Gait assistance: Contact guard assist Gait Distance (Feet): 75 Feet Assistive device: Rolling walker (2 wheels) Gait Pattern/deviations: Step-to pattern, Step-through pattern,  Decreased step length - right, Decreased step length - left, Shuffle, Trunk flexed Gait velocity: decr     General Gait Details: cues for sequence, posture and position from [employer]            Wheelchair Mobility     Tilt Bed    Modified Rankin (Stroke Patients Only)       Balance Overall balance assessment: Mild deficits observed, not formally tested                                           Pertinent Vitals/Pain Pain Assessment Pain Assessment: 0-10 Pain Score: 6  Pain Location: R knee Pain Descriptors / Indicators: Aching, Sore Pain Intervention(s): Limited activity within patient's tolerance, Monitored during session, Premedicated before session, Ice applied    Home Living Family/patient expects to be discharged to:: Private residence Living Arrangements: Alone Available Help at Discharge: Friend(s);Available 24 hours/day Type of Home: House Home Access: Stairs to enter Entrance Stairs-Rails: None Entrance Stairs-Number of Steps: 1   Home Layout: One level Home Equipment: Agricultural consultant (2 wheels);BSC/3in1;Cane - single point Additional Comments: Pt going to friends home and information above pertains to freinds home    Prior Function Prior Level of Function : Independent/Modified Independent                     Extremity/Trunk Assessment   Upper Extremity Assessment Upper Extremity Assessment: Overall WFL for tasks assessed    Lower Extremity Assessment Lower Extremity  Assessment: RLE deficits/detail RLE Deficits / Details: IND SLR with AAROM at knee -5 - 75    Cervical / Trunk Assessment Cervical / Trunk Assessment: Normal  Communication   Communication Communication: No apparent difficulties  Cognition Arousal: Alert Behavior During Therapy: WFL for tasks assessed/performed Overall Cognitive Status: Within Functional Limits for tasks assessed                                          General  Comments      Exercises Total Joint Exercises Ankle Circles/Pumps: AROM, Both, 15 reps, Supine Quad Sets: AROM, Both, 10 reps, Supine Heel Slides: AAROM, Right, 15 reps, Supine Straight Leg Raises: AAROM, AROM, Right, 10 reps, Supine Long Arc Quad: AAROM, Right, 10 reps, Seated   Assessment/Plan    PT Assessment Patient needs continued PT services  PT Problem List Decreased strength;Decreased range of motion;Decreased activity tolerance;Decreased balance;Decreased mobility;Decreased knowledge of use of DME;Pain       PT Treatment Interventions DME instruction;Gait training;Stair training;Functional mobility training;Therapeutic activities;Therapeutic exercise;Patient/family education    PT Goals (Current goals can be found in the Care Plan section)  Acute Rehab PT Goals Patient Stated Goal: Regain IND PT Goal Formulation: With patient Time For Goal Achievement: 03/19/23 Potential to Achieve Goals: Good    Frequency 7X/week     Co-evaluation               AM-PAC PT "6 Clicks" Mobility  Outcome Measure Help needed turning from your back to your side while in a flat bed without using bedrails?: A Little Help needed moving from lying on your back to sitting on the side of a flat bed without using bedrails?: A Little Help needed moving to and from a bed to a chair (including a wheelchair)?: A Little Help needed standing up from a chair using your arms (e.g., wheelchair or bedside chair)?: A Little Help needed to walk in hospital room?: A Little Help needed climbing 3-5 steps with a railing? : A Little 6 Click Score: 18    End of Session Equipment Utilized During Treatment: Gait belt Activity Tolerance: Patient tolerated treatment well Patient left: in chair;with call bell/phone within reach;with chair alarm set Nurse Communication: Mobility status PT Visit Diagnosis: Difficulty in walking, not elsewhere classified (R26.2)    Time: 8295-6213 PT Time Calculation  (min) (ACUTE ONLY): 32 min   Charges:   PT Evaluation $PT Eval Low Complexity: 1 Low PT Treatments $Therapeutic Exercise: 8-22 mins PT General Charges $$ ACUTE PT VISIT: 1 Visit         Mauro Kaufmann PT Acute Rehabilitation Services Pager [phone] Office [phone]   Kalonji Zurawski 03/13/2023, 1:23 PM

## 2023-03-15 ENCOUNTER — Other Ambulatory Visit (HOSPITAL_COMMUNITY): Payer: Self-pay

## 2023-03-15 ENCOUNTER — Encounter (HOSPITAL_COMMUNITY): Payer: Self-pay | Admitting: Orthopedic Surgery

## 2023-03-15 DIAGNOSIS — M25561 Pain in right knee: Secondary | ICD-10-CM | POA: Diagnosis not present

## 2023-03-16 NOTE — Telephone Encounter (Signed)
Dr. Celine Mans, This is just an FYI.  This patient wanted you to know that she had knee surgery.  Thank you.

## 2023-03-17 ENCOUNTER — Other Ambulatory Visit: Payer: Self-pay

## 2023-03-17 ENCOUNTER — Other Ambulatory Visit (HOSPITAL_COMMUNITY): Payer: Self-pay

## 2023-03-17 DIAGNOSIS — M25561 Pain in right knee: Secondary | ICD-10-CM | POA: Diagnosis not present

## 2023-03-18 ENCOUNTER — Other Ambulatory Visit: Payer: Self-pay

## 2023-03-19 ENCOUNTER — Other Ambulatory Visit (HOSPITAL_COMMUNITY): Payer: Self-pay

## 2023-03-19 DIAGNOSIS — M25561 Pain in right knee: Secondary | ICD-10-CM | POA: Diagnosis not present

## 2023-03-22 ENCOUNTER — Other Ambulatory Visit (HOSPITAL_COMMUNITY): Payer: Self-pay

## 2023-03-22 DIAGNOSIS — M25561 Pain in right knee: Secondary | ICD-10-CM | POA: Diagnosis not present

## 2023-03-24 ENCOUNTER — Other Ambulatory Visit (HOSPITAL_COMMUNITY): Payer: Self-pay

## 2023-03-24 DIAGNOSIS — M25561 Pain in right knee: Secondary | ICD-10-CM | POA: Diagnosis not present

## 2023-03-24 MED ORDER — HYDROCODONE-ACETAMINOPHEN 7.5-325 MG PO TABS
1.0000 | ORAL_TABLET | Freq: Four times a day (QID) | ORAL | 0 refills | Status: DC | PRN
  Filled 2023-03-24: qty 120, 30d supply, fill #0

## 2023-03-25 DIAGNOSIS — Z4789 Encounter for other orthopedic aftercare: Secondary | ICD-10-CM | POA: Diagnosis not present

## 2023-03-26 DIAGNOSIS — M25561 Pain in right knee: Secondary | ICD-10-CM | POA: Diagnosis not present

## 2023-03-29 DIAGNOSIS — M25561 Pain in right knee: Secondary | ICD-10-CM | POA: Diagnosis not present

## 2023-04-01 DIAGNOSIS — M25561 Pain in right knee: Secondary | ICD-10-CM | POA: Diagnosis not present

## 2023-04-04 ENCOUNTER — Other Ambulatory Visit: Payer: Self-pay | Admitting: Internal Medicine

## 2023-04-04 ENCOUNTER — Encounter: Payer: Self-pay | Admitting: Internal Medicine

## 2023-04-05 ENCOUNTER — Other Ambulatory Visit: Payer: Self-pay

## 2023-04-05 ENCOUNTER — Other Ambulatory Visit (HOSPITAL_COMMUNITY): Payer: Self-pay

## 2023-04-05 DIAGNOSIS — M25561 Pain in right knee: Secondary | ICD-10-CM | POA: Diagnosis not present

## 2023-04-05 MED ORDER — OMEGA-3-ACID ETHYL ESTERS 1 G PO CAPS
2.0000 g | ORAL_CAPSULE | Freq: Two times a day (BID) | ORAL | 1 refills | Status: DC
  Filled 2023-04-05: qty 360, 90d supply, fill #0
  Filled 2023-07-13: qty 360, 90d supply, fill #1

## 2023-04-06 ENCOUNTER — Other Ambulatory Visit (HOSPITAL_COMMUNITY): Payer: Self-pay

## 2023-04-06 ENCOUNTER — Other Ambulatory Visit: Payer: Self-pay | Admitting: Internal Medicine

## 2023-04-06 MED ORDER — VALACYCLOVIR HCL 1 G PO TABS
1000.0000 mg | ORAL_TABLET | Freq: Two times a day (BID) | ORAL | 0 refills | Status: DC
Start: 2023-04-06 — End: 2023-07-12
  Filled 2023-04-06 (×2): qty 28, 14d supply, fill #0

## 2023-04-08 ENCOUNTER — Other Ambulatory Visit: Payer: Self-pay

## 2023-04-08 ENCOUNTER — Other Ambulatory Visit (HOSPITAL_COMMUNITY): Payer: Self-pay

## 2023-04-08 ENCOUNTER — Encounter: Payer: Self-pay | Admitting: Allergy and Immunology

## 2023-04-08 NOTE — Telephone Encounter (Signed)
Please see note from patient, she last saw [place]. Thanks

## 2023-04-09 ENCOUNTER — Other Ambulatory Visit (HOSPITAL_COMMUNITY): Payer: Self-pay

## 2023-04-12 DIAGNOSIS — M25561 Pain in right knee: Secondary | ICD-10-CM | POA: Diagnosis not present

## 2023-04-15 DIAGNOSIS — M25561 Pain in right knee: Secondary | ICD-10-CM | POA: Diagnosis not present

## 2023-04-19 ENCOUNTER — Other Ambulatory Visit (HOSPITAL_COMMUNITY): Payer: Self-pay

## 2023-04-24 ENCOUNTER — Encounter (INDEPENDENT_AMBULATORY_CARE_PROVIDER_SITE_OTHER): Payer: Self-pay

## 2023-04-24 ENCOUNTER — Other Ambulatory Visit (HOSPITAL_COMMUNITY): Payer: Self-pay

## 2023-04-24 ENCOUNTER — Other Ambulatory Visit (HOSPITAL_BASED_OUTPATIENT_CLINIC_OR_DEPARTMENT_OTHER): Payer: Self-pay

## 2023-04-26 ENCOUNTER — Other Ambulatory Visit: Payer: Self-pay

## 2023-04-26 DIAGNOSIS — R197 Diarrhea, unspecified: Secondary | ICD-10-CM

## 2023-04-26 MED ORDER — BUDESONIDE 3 MG PO CPEP
ORAL_CAPSULE | ORAL | 0 refills | Status: AC
  Filled 2023-04-26: qty 204, 96d supply, fill #0
  Filled 2023-04-28: qty 204, 90d supply, fill #0

## 2023-04-26 NOTE — Addendum Note (Signed)
 Addended by: Lamona Curl on: 04/26/2023 04:30 PM   Modules accepted: Orders

## 2023-04-26 NOTE — Telephone Encounter (Signed)
 Sorry to hear that the lymphocytic colitis has returned.  We can ensure that there is no confounding infectious element by sending a [employer] panel.  Otherwise, completely agree with restarting budesonide at 9 mg daily for the next 6 weeks to recapture control, then can reduce to 6 mg daily x 4 weeks, and then down to 3 mg daily.  Will hold at 3 mg daily given prior need for chronic maintenance therapy. Imodium on demand ok to use as well until the budesonide kicks in.

## 2023-04-27 ENCOUNTER — Other Ambulatory Visit (HOSPITAL_BASED_OUTPATIENT_CLINIC_OR_DEPARTMENT_OTHER): Payer: Self-pay

## 2023-04-27 ENCOUNTER — Other Ambulatory Visit (HOSPITAL_COMMUNITY): Payer: Self-pay

## 2023-04-27 MED ORDER — CAPVAXIVE 0.5 ML IM SOSY
PREFILLED_SYRINGE | INTRAMUSCULAR | 0 refills | Status: AC
  Filled 2023-04-27: qty 0.5, 1d supply, fill #0

## 2023-04-27 MED ORDER — PREVNAR 20 0.5 ML IM SUSY
0.5000 mL | PREFILLED_SYRINGE | Freq: Once | INTRAMUSCULAR | 0 refills | Status: AC
  Filled 2023-04-27: qty 0.5, 1d supply, fill #0

## 2023-04-28 ENCOUNTER — Other Ambulatory Visit (HOSPITAL_COMMUNITY): Payer: Self-pay

## 2023-04-30 ENCOUNTER — Telehealth: Payer: Self-pay

## 2023-04-30 NOTE — Telephone Encounter (Signed)
 Left message on voicemail that results of [employer] test are negative per Dr Barron Alvine.  Advised patient to return call to the office with any further questions or concerns.

## 2023-04-30 NOTE — Telephone Encounter (Signed)
 Received fax from [employer] with results of stool studies and placed in Dr Frankey Shown Inbox for his review.

## 2023-04-30 NOTE — Telephone Encounter (Signed)
 [employer] results reviewed and negative for infection.

## 2023-05-04 ENCOUNTER — Other Ambulatory Visit (HOSPITAL_COMMUNITY): Payer: Self-pay

## 2023-05-04 ENCOUNTER — Other Ambulatory Visit: Payer: Self-pay

## 2023-05-04 ENCOUNTER — Other Ambulatory Visit: Payer: Self-pay | Admitting: Internal Medicine

## 2023-05-04 DIAGNOSIS — E782 Mixed hyperlipidemia: Secondary | ICD-10-CM

## 2023-05-04 DIAGNOSIS — M25561 Pain in right knee: Secondary | ICD-10-CM | POA: Diagnosis not present

## 2023-05-04 MED ORDER — ATORVASTATIN CALCIUM 40 MG PO TABS
40.0000 mg | ORAL_TABLET | Freq: Every day | ORAL | 1 refills | Status: DC
  Filled 2023-05-04: qty 90, 90d supply, fill #0
  Filled 2023-07-31: qty 90, 90d supply, fill #1

## 2023-05-05 ENCOUNTER — Other Ambulatory Visit: Payer: Self-pay

## 2023-05-05 ENCOUNTER — Other Ambulatory Visit (HOSPITAL_COMMUNITY): Payer: Self-pay

## 2023-05-05 DIAGNOSIS — M47816 Spondylosis without myelopathy or radiculopathy, lumbar region: Secondary | ICD-10-CM | POA: Diagnosis not present

## 2023-05-05 DIAGNOSIS — M150 Primary generalized (osteo)arthritis: Secondary | ICD-10-CM | POA: Diagnosis not present

## 2023-05-05 DIAGNOSIS — G894 Chronic pain syndrome: Secondary | ICD-10-CM | POA: Diagnosis not present

## 2023-05-05 DIAGNOSIS — Z79891 Long term (current) use of opiate analgesic: Secondary | ICD-10-CM | POA: Diagnosis not present

## 2023-05-05 MED ORDER — DICLOFENAC SODIUM 1 % EX GEL
2.0000 g | Freq: Four times a day (QID) | CUTANEOUS | 4 refills | Status: AC | PRN
  Filled 2023-06-05: qty 100, 30d supply, fill #0
  Filled 2024-02-12 – 2024-02-15 (×2): qty 100, 30d supply, fill #1

## 2023-05-05 MED ORDER — METHOCARBAMOL 750 MG PO TABS
750.0000 mg | ORAL_TABLET | Freq: Three times a day (TID) | ORAL | 2 refills | Status: AC | PRN
  Filled 2023-05-05: qty 270, 90d supply, fill #0
  Filled 2023-09-08: qty 270, 90d supply, fill #1
  Filled 2024-01-04: qty 270, 90d supply, fill #2

## 2023-05-05 MED ORDER — BELBUCA 750 MCG BU FILM
1.0000 | ORAL_FILM | Freq: Two times a day (BID) | BUCCAL | 2 refills | Status: DC
  Filled 2023-05-05 – 2023-05-19 (×2): qty 60, 30d supply, fill #0
  Filled 2023-06-21: qty 60, 30d supply, fill #1
  Filled 2023-07-27: qty 60, 30d supply, fill #2

## 2023-05-06 DIAGNOSIS — M25561 Pain in right knee: Secondary | ICD-10-CM | POA: Diagnosis not present

## 2023-05-11 ENCOUNTER — Other Ambulatory Visit: Payer: Self-pay

## 2023-05-11 ENCOUNTER — Ambulatory Visit: Admitting: Allergy and Immunology

## 2023-05-11 ENCOUNTER — Encounter: Payer: Self-pay | Admitting: Allergy and Immunology

## 2023-05-11 VITALS — BP 136/70 | HR 80 | Temp 96.8°F | Resp 16 | Ht 63.0 in | Wt 150.8 lb

## 2023-05-11 DIAGNOSIS — D839 Common variable immunodeficiency, unspecified: Secondary | ICD-10-CM

## 2023-05-11 DIAGNOSIS — K52832 Lymphocytic colitis: Secondary | ICD-10-CM | POA: Diagnosis not present

## 2023-05-11 DIAGNOSIS — J479 Bronchiectasis, uncomplicated: Secondary | ICD-10-CM

## 2023-05-11 DIAGNOSIS — K219 Gastro-esophageal reflux disease without esophagitis: Secondary | ICD-10-CM

## 2023-05-11 DIAGNOSIS — M25561 Pain in right knee: Secondary | ICD-10-CM | POA: Diagnosis not present

## 2023-05-11 NOTE — Progress Notes (Unsigned)
 [place] [facility] [place] [facility] [place] [facility] [place]   Follow-up Note  Referring Provider: [name], Almira Bar* Primary Provider: [name], Limmie Patricia, MD Date of Office Visit: 05/11/2023  Subjective:   Beverly [name] (DOB: 1945-09-29) is a 78 y.o. female [facility] in re-evaluation of the following:  HPI: Beverly [name] returns to this clinic in evaluation of CVID, bronchiectasis, history of Nocardia pneumonia, history of asthma, history of reflux, history of lymphocytic colitis.  I last saw him in this clinic 23 February 2023.  She has not had any infections since I have last seen her in this clinic.  She continues on subcutaneous immunoglobulin infusions.  She has no significant lower airway symptoms.  She does not use the short acting bronchodilator.  She had a total knee replacement in January 2025 on the right and she is now undergoing PT and her aim is to undergo an exercise program so that she can take a cruise to [place] in June 2025.  She has had very little problems with her upper airways while using montelukast.  Few days ago she did develop some clear nasal discharge and some slight sinus pressure and a little bit of a cough but that seems to be improving.  Her lymphocytic colitis is presently being treated with oral budesonide by GI.  She did discontinue her oral budesonide because she actually developed constipation but within 4 weeks of discontinuing this agent she had diarrhea once again.  She thinks her reflux is under good control but she still has a lot of throat clearing and mucus stuck in her throat.  She is making an effort not to throat clear.  She does use her Protonix and famotidine.  Should be noted that she wakes up every morning with a bad taste in her mouth.  She has chocolate 3 times per week usually around 7 PM in the form of chocolate ice cream and she has a caffeinated drink in the  morning.  Allergies as of 05/11/2023       Reactions   Other Other (See Comments)   Adhesive [tape] Rash   Blister    Codeine Rash        Medication List    acetaminophen 650 MG CR tablet Commonly known as: TYLENOL Take 650-1,300 mg by mouth every 8 (eight) hours as needed for pain.   albuterol 108 (90 Base) MCG/ACT inhaler Commonly known as: Ventolin HFA Inhale 2 puffs into the lungs every 4 (four) hours as needed for wheezing or shortness of breath.   ARTIFICIAL TEAR OP Place 2 drops into both eyes in the morning and at bedtime.   atorvastatin 40 MG tablet Commonly known as: LIPITOR Take 1 tablet (40 mg total) by mouth daily.   Belbuca 750 MCG Film Generic drug: Buprenorphine HCl Place 750 mcg inside cheek 2 (two) times daily.   Belbuca 750 MCG Film Generic drug: Buprenorphine HCl Place 1 Film to inside of mouth 2 (two) times daily.   budesonide 3 MG 24 hr capsule Commonly known as: ENTOCORT EC Take 3 capsules (9 mg total) by mouth daily for 42 days, THEN 2 capsules (6 mg total) daily for 24 days, THEN 1 capsule (3 mg total) daily. Start taking on: April 26, 2023   Calcium Carbonate-Vitamin D 600-200 MG-UNIT Tabs Take 1 tablet by mouth 2 (two) times daily.   Capvaxive 0.5 ML injection Generic drug: pneumococcal 21-valent conjugate vaccine Inject into the  muscle.   clopidogrel 75 MG tablet Commonly known as: PLAVIX Take 1 tablet (75 mg total) by mouth daily.   Culturelle Adult Ult Balance Caps Take 1 capsule by mouth daily.   diclofenac Sodium 1 % Gel Commonly known as: VOLTAREN Apply 2-4 g topically 4 (four) times daily as needed.   dicyclomine 10 MG capsule Commonly known as: BENTYL Take 1 capsule (10 mg total) by mouth 2 (two) times daily as needed for spasms.   EPINEPHrine 0.3 mg/0.3 mL Soaj injection Commonly known as: EPI-PEN Inject 0.3 mg into the muscle as needed for anaphylaxis.   famotidine 40 MG tablet Commonly known as: Pepcid Take 1  tablet (40 mg total) by mouth at bedtime.   fluorouracil 5 % cream Commonly known as: Efudex Apply two times a day to areas on hands for 2 weeks.   guaiFENesin 600 MG 12 hr tablet Commonly known as: MUCINEX Take 600 mg by mouth 2 (two) times daily.   HYDROcodone-acetaminophen 7.5-325 MG tablet Commonly known as: NORCO Take 1 tablet by mouth 4 (four) times daily as needed for pain   ipratropium 0.06 % nasal spray Commonly known as: ATROVENT Place 2 sprays into both nostrils 4 (four) times daily as needed for rhinitis.   levocetirizine 5 MG tablet Commonly known as: XYZAL Take 1 tablet (5 mg total) by mouth every evening.   levothyroxine 25 MCG tablet Commonly known as: SYNTHROID Take 1 tablet (25 mcg total) by mouth daily.   loratadine 10 MG tablet Commonly known as: Claritin Take 1 tablet (10 mg total) by mouth daily as needed for allergies (Can take an extra dose during flare ups.).   melatonin 5 MG Tabs Take 5 mg by mouth at bedtime.   methocarbamol 750 MG tablet Commonly known as: ROBAXIN Take 1 tablet (750 mg total) by mouth 3 (three) times daily as needed.   montelukast 10 MG tablet Commonly known as: SINGULAIR Take 1 tablet (10 mg total) by mouth at bedtime.   MULTIVITAMIN ADULT PO Take 1 tablet by mouth daily.   nitroGLYCERIN 0.4 MG SL tablet Commonly known as: Nitrostat Place 1 tablet (0.4 mg total) under the tongue every 5 (five) minutes as needed.   omega-3 acid ethyl esters 1 g capsule Commonly known as: LOVAZA Take 2 capsules (2 g total) by mouth 2 (two) times daily.   pantoprazole 40 MG tablet Commonly known as: PROTONIX Take 1 tablet (40 mg total) by mouth in the morning.   polyethylene glycol 17 g packet Commonly known as: MIRALAX / GLYCOLAX Take 17 g by mouth daily.   pregabalin 50 MG capsule Commonly known as: Lyrica Take 1 capsule (50 mg total) by mouth every 8 (eight) hours. STOP gabapentin.   psyllium 58.6 % packet Commonly known  as: METAMUCIL Take 1 packet by mouth daily.   QC TUMERIC COMPLEX PO Take 1 capsule by mouth 2 (two) times daily.   Spikevax syringe Generic drug: COVID-19 mRNA vaccine Inject 0.5 mLs into the muscle once.   TART CHERRY PO Take 1 capsule by mouth daily.   valACYclovir 500 MG tablet Commonly known as: Valtrex Take 1 tablet (500 mg total) by mouth daily.   venlafaxine XR 75 MG 24 hr capsule Commonly known as: Effexor XR Take 1 capsule (75 mg total) by mouth daily.   VITAMIN D-3 PO Take 1 tablet by mouth daily.   Xembify 10 GM/50ML Soln Generic drug: Immune Globulin (Human)-klhw Inject 10 g into the skin once a week. 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   right sholder   BCC (basal cell carcinoma of skin) 05/09/2014   mid lower back   BCC (basal cell carcinoma of skin) 05/03/2017   right low back   BCC (basal cell carcinoma of skin) 05/03/2017   left upper back   BCC (basal cell carcinoma of skin) 07/05/2018   left mid back   BCC (basal cell carcinoma of skin) 05/20/1992   upper back   BCC (basal cell carcinoma of skin) 07/29/1993   left sholder medial   BCC (basal cell carcinoma of skin) 07/29/1993   left sholder lateral   BCC (basal cell carcinoma of skin) 07/29/1993   right thigh   BCC (basal cell carcinoma of skin) 07/29/1993   right sholder   BCC (basal cell carcinoma of skin) 12/22/1994   right mid forearm   BCC (basal cell carcinoma of skin) 12/22/1994   right upper forearm   BCC (basal cell carcinoma of skin) 12/22/1994   lower right upper arm   BCC (basal cell carcinoma of skin) 12/22/1994   right upper arm sholder   BCC (basal cell carcinoma of skin) 08/11/1995   left leg below knee    BCC (basal cell carcinoma of skin) 04/11/2002   mid back   BCC (basal cell carcinoma of skin) 12/10/2002   right center upper back   BCC (basal cell carcinoma of skin) 05/26/2005   right post sholder   BCC (basal cell carcinoma) 05/09/2014   left inner shin   BCC (basal cell carcinoma) 06/12/2014   left forearm   Blood transfusion without reported diagnosis 11/06/21   Bowen's disease 10/07/1994   right post knee, right inner forearm/wrist   Bowen's disease 08/11/1995   left sholder   Bronchiectasis (HCC)    Cataract    left   Chronic pain    Common variable immunodeficiency (HCC)    sees dr Ellin Goodie 02-11-2021 epic   COPD (chronic obstructive pulmonary disease) (HCC) 2022   Brochiectosis / tracheobrocheomalacia   Coronary artery disease    Depression    hx of   Diverticulosis of colon 1998   mild   DJD (degenerative joint disease)    Duodenal ulcer    h/o yrs ago   Dysrhythmia    GERD (gastroesophageal reflux disease)    Heart murmur 1949   History of SCC (squamous cell carcinoma) of skin    Dr. Jorja Loa   History of sinus bradycardia    HLD (hyperlipidemia)    hypertriglyceridemia   Hypertensive retinopathy of both eyes 11/18/2017   Hypothyroidism    IBS (irritable bowel syndrome)    Internal hemorrhoids 1998   Lymphocytic colitis    Mitral regurgitation    mild   Myocardial infarction [facility]) 2023   Cardiac CT 2023 - 2 cardiac stents placed   Nocardiosis    relased by infection disease dec 2022   Osteoarthritis    feet,shoulder,neck,back,hips and hands.   Pneumonia 2015   PONV (postoperative nausea and vomiting)    Rotator cuff tear, right 02/2019   infraspinatus and supraspinatus, and dislocation of long head of bicep tendon (Dr. Charlann Boxer)   SCC (  squamous cell carcinoma) 11/26/2014   left hand, right hand, right deltoid mnay areas   SCC (squamous cell carcinoma) 05/03/2017   left cheek   Sleep apnea    uses a mouth guard nightly   Squamous cell carcinoma in  situ (SCCIS) 07/05/2018   left hand   Tracheobronchomalacia    Trigger finger    left hand ring finger   Vitamin D deficiency    mild    Past Surgical History:  Procedure Laterality Date   ABDOMINAL EXPOSURE N/A 02/02/2022   Procedure: ABDOMINAL EXPOSURE;  Surgeon: Cephus Shelling, MD;  Location[facility];  Service: Vascular;  Laterality: N/A;   ABDOMINAL HYSTERECTOMY  1998   complete   BIOPSY  11/05/2021   Procedure: BIOPSY;  Surgeon: Tressia Danas, MD;  Location: [facility] ENDOSCOPY;  Service: Gastroenterology;;   BLEPHAROPLASTY Bilateral 01/2018   BROW LIFT Bilateral 10/10/2018   Procedure: Coralee Rud LIFT;  Surgeon: Peggye Form, DO;  Location: [facility];  Service: Plastics;  Laterality: Bilateral;   BUNIONECTOMY     R 12/08, L 2004 (Dr. Wynelle Cleveland)   CARDIAC CATHETERIZATION     CARPAL TUNNEL RELEASE  1989   bilateral   CARPAL TUNNEL RELEASE Left 03/06/2021   Procedure: Left Revision Carpal Tunnel Release with hypothenar fat pad flap;  Surgeon: Gomez Cleverly, MD;  Location: [facility];  Service: Orthopedics;  Laterality: Left;  with local anesthesia   CATARACT EXTRACTION Bilateral Left in 08/2012, Right 04/2016   Dr.Hecker   CESAREAN SECTION     1981 and 1986   CHOLECYSTECTOMY  1992   laparoscopic   CLOSED REDUCTION NASAL FRACTURE N/A 08/29/2018   Procedure: CLOSED REDUCTION NASAL FRACTURE;  Surgeon: Peggye Form, DO;  Location: [facility];  Service: Plastics;  Laterality: N/A;  1 hour, please   COLONOSCOPY  2006   COLONOSCOPY  01/2012   due again 01/2021; mild diverticulosis   CORONARY STENT INTERVENTION N/A 05/19/2021   Procedure: CORONARY STENT INTERVENTION;  Surgeon: Yvonne Kendall, MD;  Location: [facility] INVASIVE CV LAB;  Service: Cardiovascular;  Laterality: N/A;   CORONARY ULTRASOUND/IVUS N/A 05/19/2021   Procedure: Intravascular Ultrasound/IVUS;  Surgeon: Yvonne Kendall, MD;  Location: [facility] INVASIVE CV LAB;   Service: Cardiovascular;  Laterality: N/A;   COSMETIC SURGERY  2018   epidural steroid injection, back  02/2010   ESOPHAGOGASTRODUODENOSCOPY (EGD) WITH PROPOFOL N/A 11/05/2021   Procedure: ESOPHAGOGASTRODUODENOSCOPY (EGD) WITH PROPOFOL;  Surgeon: Tressia Danas, MD;  Location: [facility] ENDOSCOPY;  Service: Gastroenterology;  Laterality: N/A;   EYE SURGERY  2017   HIP SURGERY     right bursectomy x 3   HIP SURGERY Right 2000   torn cartilage, repaired   INGUINAL HERNIA REPAIR  10/2007   bilat   JOINT REPLACEMENT  04/11/2019   Right shoulder   LEFT HEART CATH AND CORONARY ANGIOGRAPHY N/A 05/19/2021   Procedure: LEFT HEART CATH AND CORONARY ANGIOGRAPHY;  Surgeon: Yvonne Kendall, MD;  Location: [facility] INVASIVE CV LAB;  Service: Cardiovascular;  Laterality: N/A;   LUMBAR FUSION  01/2022   NECK SURGERY  1989   c6-7 cervical laminectomy and diskecotmy   REVERSE SHOULDER ARTHROPLASTY Right 04/11/2019   Procedure: REVERSE SHOULDER ARTHROPLASTY;  Surgeon: Yolonda Kida, MD;  Location: [facility] OR;  Service: Orthopedics;  Laterality: Right;  Regional Block   SHOULDER SURGERY Left 03/2003   left rotator cuff repair   SHOULDER SURGERY Left 03/14/2018   rotator cuff repair; Dr. Shelle Iron   SPINE SURGERY  1989 / lumbar   TONSILLECTOMY  age 66   TOTAL KNEE ARTHROPLASTY Right 03/12/2023   Procedure: TOTAL KNEE ARTHROPLASTY;  Surgeon: [name], MD;  Location: [facility] ORS;  Service: Orthopedics;  Laterality: Right;   TROCHANTERIC BURSA EXCISION Right 1978   TUBAL LIGATION  1986   UPPER GASTROINTESTINAL ENDOSCOPY     WISDOM TOOTH EXTRACTION     as teenager    Review of systems negative except as noted in HPI / PMHx or noted below:  Review of Systems  Constitutional: Negative.   HENT: Negative.    Eyes: Negative.   Respiratory: Negative.    Cardiovascular: Negative.   Gastrointestinal: Negative.   Genitourinary: Negative.   Musculoskeletal: Negative.   Skin: Negative.   Neurological: Negative.    Endo/Heme/Allergies: Negative.   Psychiatric/Behavioral: Negative.       Objective:   Vitals:   05/11/23 1504  BP: 136/70  Pulse: 80  Resp: 16  Temp: (!) 96.8 F (36 C)  SpO2: 95%   Height: 5\' 3"  (160 cm)  Weight: 150 lb 12.8 oz (68.4 kg)   Physical Exam Constitutional:      Appearance: She is not diaphoretic.  HENT:     Head: Normocephalic.     Right Ear: Tympanic membrane, ear canal and external ear normal.     Left Ear: Tympanic membrane, ear canal and external ear normal.     Nose: Nose normal. No mucosal edema or rhinorrhea.     Mouth/Throat:     Pharynx: Uvula midline. No oropharyngeal exudate.  Eyes:     Conjunctiva/sclera: Conjunctivae normal.  Neck:     Thyroid: No thyromegaly.     Trachea: Trachea normal. No tracheal tenderness or tracheal deviation.  Cardiovascular:     Rate and Rhythm: Normal rate and regular rhythm.     Heart sounds: Normal heart sounds, S1 normal and S2 normal. No murmur heard. Pulmonary:     Effort: No respiratory distress.     Breath sounds: Normal breath sounds. No stridor. No wheezing or rales.  Lymphadenopathy:     Head:     Right side of head: No tonsillar adenopathy.     Left side of head: No tonsillar adenopathy.     Cervical: No cervical adenopathy.  Skin:    Findings: No erythema or rash.     Nails: There is no clubbing.  Neurological:     Mental Status: She is alert.     Diagnostics: none  Assessment and Plan:   1. CVID (common variable immunodeficiency) (HCC)   2. Bronchiectasis without complication (HCC)   3. LPRD (laryngopharyngeal reflux disease)   4. Lymphocytic colitis    1.  Continue immunoglobulin infusions every week  2.  Continue to treat reflex / LPR:   A.  Pantoprazole 40 mg in AM  B.  Famotidine 40 mg in PM   C.  Replace throat clearing with swallowing/drinking maneuver  D.  Decrease caffeine and chocolate consumption  3.  Treat and prevent inflammation of upper airway:   A.  Montelukast  10 mg -1 tablet 1 time per day  4. If needed:  A. Loratadine 10 mg 1 tablet 1 time per day  B. Ipratropium 0.06% - 2 sprays each nostril every 6 hours (4x/day) C. Nasal saline D. Albuterol HFA - 2 inhalations every 4-6 hours  5.  Continue budesonide therapy for lymphocytic colitis  7.  Return to clinic in 6 months or earlier if problem  8. Influenza = Tamiflu.  Covid = Paxlovid   Overall Beverly [name] appears to be doing quite well and she will continue on her immunoglobulin infusions every week.  We have 3 readings in the 900 to 1200 mg/dL IgG over the course of the past 2 years and I do not think there is any need to recheck an IgG at this point as she has seems to have documented a steady state of IgG level as a function of her immunoglobulin infusions.  I think she still has a little problem with her LPR and I have asked her to discontinue her chocolate ice cream consumption at 7 PM at night which should help her LPR issue and help her bed taste and there mouth when she wakes in the morning.  She will continue on all other agents as noted above.  Will see her back in this clinic in 6 months or earlier if there is a problem.  Laurette Schimke, MD Allergy / Immunology [facility]

## 2023-05-11 NOTE — Patient Instructions (Addendum)
  1.  Continue immunoglobulin infusions every week  2.  Continue to treat reflex / LPR:   A.  Pantoprazole 40 mg in AM  B.  Famotidine 40 mg in PM   C.  Replace throat clearing with swallowing/drinking maneuver  D.  Decrease caffeine and chocolate consumption  3.  Treat and prevent inflammation of upper airway:   A.  Montelukast 10 mg -1 tablet 1 time per day  4. If needed:  A. Loratadine 10 mg 1 tablet 1 time per day  B. Ipratropium 0.06% - 2 sprays each nostril every 6 hours (4x/day) C. Nasal saline D. Albuterol HFA - 2 inhalations every 4-6 hours  5.  Continue budesonide therapy for lymphocytic colitis  7.  Return to clinic in 6 months or earlier if problem  8. Influenza = Tamiflu. Covid = Paxlovid

## 2023-05-12 ENCOUNTER — Encounter: Payer: Self-pay | Admitting: Allergy and Immunology

## 2023-05-12 ENCOUNTER — Other Ambulatory Visit (HOSPITAL_COMMUNITY): Payer: Self-pay

## 2023-05-12 DIAGNOSIS — M5451 Vertebrogenic low back pain: Secondary | ICD-10-CM | POA: Diagnosis not present

## 2023-05-13 DIAGNOSIS — S86111A Strain of other muscle(s) and tendon(s) of posterior muscle group at lower leg level, right leg, initial encounter: Secondary | ICD-10-CM | POA: Diagnosis not present

## 2023-05-13 DIAGNOSIS — M25561 Pain in right knee: Secondary | ICD-10-CM | POA: Diagnosis not present

## 2023-05-14 DIAGNOSIS — Z1231 Encounter for screening mammogram for malignant neoplasm of breast: Secondary | ICD-10-CM | POA: Diagnosis not present

## 2023-05-14 LAB — HM MAMMOGRAPHY

## 2023-05-17 ENCOUNTER — Encounter: Payer: Self-pay | Admitting: Internal Medicine

## 2023-05-18 DIAGNOSIS — M25561 Pain in right knee: Secondary | ICD-10-CM | POA: Diagnosis not present

## 2023-05-20 ENCOUNTER — Other Ambulatory Visit: Payer: Self-pay

## 2023-05-20 DIAGNOSIS — M25561 Pain in right knee: Secondary | ICD-10-CM | POA: Diagnosis not present

## 2023-05-25 DIAGNOSIS — M25561 Pain in right knee: Secondary | ICD-10-CM | POA: Diagnosis not present

## 2023-05-27 DIAGNOSIS — M25561 Pain in right knee: Secondary | ICD-10-CM | POA: Diagnosis not present

## 2023-05-28 LAB — GENECONNECT MOLECULAR SCREEN: Genetic Analysis Overall Interpretation: NEGATIVE

## 2023-05-31 DIAGNOSIS — M25561 Pain in right knee: Secondary | ICD-10-CM | POA: Diagnosis not present

## 2023-06-02 DIAGNOSIS — M25561 Pain in right knee: Secondary | ICD-10-CM | POA: Diagnosis not present

## 2023-06-05 ENCOUNTER — Other Ambulatory Visit (HOSPITAL_BASED_OUTPATIENT_CLINIC_OR_DEPARTMENT_OTHER): Payer: Self-pay

## 2023-06-05 ENCOUNTER — Other Ambulatory Visit: Payer: Self-pay | Admitting: Family Medicine

## 2023-06-05 ENCOUNTER — Other Ambulatory Visit (HOSPITAL_COMMUNITY): Payer: Self-pay

## 2023-06-07 ENCOUNTER — Other Ambulatory Visit: Payer: Self-pay

## 2023-06-07 ENCOUNTER — Encounter: Payer: Self-pay | Admitting: Allergy and Immunology

## 2023-06-07 NOTE — Telephone Encounter (Signed)
 This is Dr. Viviano Ground patient.

## 2023-06-08 ENCOUNTER — Other Ambulatory Visit: Payer: Self-pay

## 2023-06-08 DIAGNOSIS — M25561 Pain in right knee: Secondary | ICD-10-CM | POA: Diagnosis not present

## 2023-06-08 MED ORDER — VALACYCLOVIR HCL 500 MG PO TABS
500.0000 mg | ORAL_TABLET | Freq: Every day | ORAL | 1 refills | Status: DC
  Filled 2023-06-08: qty 90, 90d supply, fill #0

## 2023-06-09 ENCOUNTER — Other Ambulatory Visit: Payer: Self-pay

## 2023-06-09 DIAGNOSIS — R5383 Other fatigue: Secondary | ICD-10-CM

## 2023-06-09 DIAGNOSIS — M65342 Trigger finger, left ring finger: Secondary | ICD-10-CM | POA: Diagnosis not present

## 2023-06-10 DIAGNOSIS — R5383 Other fatigue: Secondary | ICD-10-CM | POA: Diagnosis not present

## 2023-06-10 DIAGNOSIS — M25561 Pain in right knee: Secondary | ICD-10-CM | POA: Diagnosis not present

## 2023-06-11 LAB — CBC WITH DIFFERENTIAL/PLATELET
Basophils Absolute: 0 10*3/uL (ref 0.0–0.2)
Basos: 0 %
EOS (ABSOLUTE): 0 10*3/uL (ref 0.0–0.4)
Eos: 0 %
Hematocrit: 40.9 % (ref 34.0–46.6)
Hemoglobin: 13.6 g/dL (ref 11.1–15.9)
Immature Grans (Abs): 0 10*3/uL (ref 0.0–0.1)
Immature Granulocytes: 1 %
Lymphocytes Absolute: 1.1 10*3/uL (ref 0.7–3.1)
Lymphs: 21 %
MCH: 32.3 pg (ref 26.6–33.0)
MCHC: 33.3 g/dL (ref 31.5–35.7)
MCV: 97 fL (ref 79–97)
Monocytes Absolute: 0.5 10*3/uL (ref 0.1–0.9)
Monocytes: 9 %
Neutrophils Absolute: 3.7 10*3/uL (ref 1.4–7.0)
Neutrophils: 69 %
Platelets: 178 10*3/uL (ref 150–450)
RBC: 4.21 x10E6/uL (ref 3.77–5.28)
RDW: 14.4 % (ref 11.7–15.4)
WBC: 5.3 10*3/uL (ref 3.4–10.8)

## 2023-06-11 LAB — THYROID PEROXIDASE ANTIBODY: Thyroperoxidase Ab SerPl-aCnc: 24 [IU]/mL (ref 0–34)

## 2023-06-11 LAB — T4, FREE: Free T4: 1.02 ng/dL (ref 0.82–1.77)

## 2023-06-11 LAB — CORTISOL-AM, BLOOD: Cortisol - AM: 0.9 ug/dL — ABNORMAL LOW (ref 6.2–19.4)

## 2023-06-12 ENCOUNTER — Other Ambulatory Visit (HOSPITAL_COMMUNITY): Payer: Self-pay

## 2023-06-15 ENCOUNTER — Telehealth: Payer: Self-pay | Admitting: Allergy and Immunology

## 2023-06-15 DIAGNOSIS — M7061 Trochanteric bursitis, right hip: Secondary | ICD-10-CM | POA: Diagnosis not present

## 2023-06-15 DIAGNOSIS — M25561 Pain in right knee: Secondary | ICD-10-CM | POA: Diagnosis not present

## 2023-06-15 NOTE — Telephone Encounter (Signed)
 Beverly [name] has been internally referred to [facility] Endocrinology.  They will reach out to the patient to schedule.

## 2023-06-16 ENCOUNTER — Other Ambulatory Visit (HOSPITAL_COMMUNITY): Payer: Self-pay

## 2023-06-19 ENCOUNTER — Other Ambulatory Visit (HOSPITAL_COMMUNITY): Payer: Self-pay

## 2023-06-21 ENCOUNTER — Other Ambulatory Visit: Payer: Self-pay

## 2023-06-21 ENCOUNTER — Other Ambulatory Visit (HOSPITAL_COMMUNITY): Payer: Self-pay

## 2023-06-21 ENCOUNTER — Ambulatory Visit: Payer: Medicare PPO | Admitting: Internal Medicine

## 2023-06-21 ENCOUNTER — Encounter: Payer: Self-pay | Admitting: Internal Medicine

## 2023-06-21 VITALS — BP 128/54 | HR 65 | Ht 64.0 in | Wt 151.0 lb

## 2023-06-21 DIAGNOSIS — K219 Gastro-esophageal reflux disease without esophagitis: Secondary | ICD-10-CM

## 2023-06-21 DIAGNOSIS — R053 Chronic cough: Secondary | ICD-10-CM | POA: Diagnosis not present

## 2023-06-21 DIAGNOSIS — J479 Bronchiectasis, uncomplicated: Secondary | ICD-10-CM | POA: Diagnosis not present

## 2023-06-21 DIAGNOSIS — J309 Allergic rhinitis, unspecified: Secondary | ICD-10-CM

## 2023-06-21 DIAGNOSIS — D839 Common variable immunodeficiency, unspecified: Secondary | ICD-10-CM

## 2023-06-21 DIAGNOSIS — G4733 Obstructive sleep apnea (adult) (pediatric): Secondary | ICD-10-CM

## 2023-06-21 NOTE — Progress Notes (Signed)
 Beverly [name]    [id number]    [birth year]  Primary Care Physician:Hernandez Fran Imus, MD Date of Appointment: 06/21/2023 Established Patient Visit  Chief complaint:   Chief Complaint  Patient presents with   Follow-up     HPI: Beverly [name] is a 78 y.o. woman with bronchiectasis a hypogammaglobulinemia with chronic rhinitis. On IVIG infusions for hypogammaglobulinemia. Has nocardia pulmonary infection and is on 12 months therapy with bactrim  which finished Dec 2022. Also has LPR and history of gastric ulcers. Has chronic vasomotor rhinitis and takes ipratropium. Has CAD and had PCI with 1 DES stents placed in 2023. Has lymphocytic colitis. TKA in Jan 2025.  Interval Updates: Here for annual follow up  Continues to follow with Dr. Kozlow and continues on IVIG infusions for CVID. Dramatic improvement in her respiratory infection burden since initiating this. She notes fatigue following the weekly infusions. No interval flares of bronchiectasis or pneumonia.  She is currently off all inhaler/nebulizer therapy.   Stopped budesonide  for her lymphocytic colitis due to constipation. She had diarrhea that returned.   She had some blood work done for her fatigue and was found to have low serum cortisol.   LPR and allerties under control.   She is on some prn nasal sprays (astelin , nasacort ) astelin  works best for her. Occasional cough.  On xyzal  for rhinitis/allergies and feels controlled.   Wearing oral appliance for OSA and reports good control of daytime sleepiness.    I have reviewed the patient's past medical, family, and social history and made changes as appropriate.    Past Medical History:  Diagnosis Date   Allergy    Amaurosis fugax    negative w/u through [facility] right eye   Asthma    no attacks in several yrs per pt on 03-03-2021   Back pain    Dr Ronni Colace 02/2010-epidural injection x 2 at L4-5 with good effect   Basosquamous carcinoma  07/05/2018   right sholder   BCC (basal cell carcinoma of skin) 05/09/2014   mid lower back   BCC (basal cell carcinoma of skin) 05/03/2017   right low back   BCC (basal cell carcinoma of skin) 05/03/2017   left upper back   BCC (basal cell carcinoma of skin) 07/05/2018   left mid back   BCC (basal cell carcinoma of skin) 05/20/1992   upper back   BCC (basal cell carcinoma of skin) 07/29/1993   left sholder medial   BCC (basal cell carcinoma of skin) 07/29/1993   left sholder lateral   BCC (basal cell carcinoma of skin) 07/29/1993   right thigh   BCC (basal cell carcinoma of skin) 07/29/1993   right sholder   BCC (basal cell carcinoma of skin) 12/22/1994   right mid forearm   BCC (basal cell carcinoma of skin) 12/22/1994   right upper forearm   BCC (basal cell carcinoma of skin) 12/22/1994   lower right upper arm   BCC (basal cell carcinoma of skin) 12/22/1994   right upper arm sholder   BCC (basal cell carcinoma of skin) 08/11/1995   left leg below knee   BCC (basal cell carcinoma of skin) 04/11/2002   mid back   BCC (basal cell carcinoma of skin) 12/10/2002   right center upper back   BCC (basal cell carcinoma of skin) 05/26/2005   right post sholder   BCC (basal cell carcinoma) 05/09/2014   left inner shin  BCC (basal cell carcinoma) 06/12/2014   left forearm   Blood transfusion without reported diagnosis 11/06/21   Bowen's disease 10/07/1994   right post knee, right inner forearm/wrist   Bowen's disease 08/11/1995   left sholder   Bronchiectasis (HCC)    Cataract    left   Chronic pain    Common variable immunodeficiency [facility])    sees dr Berenice Bream 02-11-2021 epic   COPD (chronic obstructive pulmonary disease) (HCC) 2022   Brochiectosis / tracheobrocheomalacia   Coronary artery disease    Depression    hx of   Diverticulosis of colon 1998   mild   DJD (degenerative joint disease)    Duodenal ulcer    h/o yrs ago   Dysrhythmia    GERD (gastroesophageal  reflux disease)    Heart murmur 1949   History of SCC (squamous cell carcinoma) of skin    Dr. Steen Eden   History of sinus bradycardia    HLD (hyperlipidemia)    hypertriglyceridemia   Hypertensive retinopathy of both eyes 11/18/2017   Hypothyroidism    IBS (irritable bowel syndrome)    Internal hemorrhoids 1998   Lymphocytic colitis    Mitral regurgitation    mild   Myocardial infarction [facility]) 2023   Cardiac CT 2023 - 2 cardiac stents placed   Nocardiosis    relased by infection disease dec 2022   Osteoarthritis    feet,shoulder,neck,back,hips and hands.   Pneumonia 2015   PONV (postoperative nausea and vomiting)    Rotator cuff tear, right 02/2019   infraspinatus and supraspinatus, and dislocation of long head of bicep tendon (Dr. Bernard Brick)   SCC (squamous cell carcinoma) 11/26/2014   left hand, right hand, right deltoid mnay areas   SCC (squamous cell carcinoma) 05/03/2017   left cheek   Sleep apnea    uses a mouth guard nightly   Squamous cell carcinoma in situ (SCCIS) 07/05/2018   left hand   Tracheobronchomalacia    Trigger finger    left hand ring finger   Vitamin D  deficiency    mild    Past Surgical History:  Procedure Laterality Date   ABDOMINAL EXPOSURE N/A 02/02/2022   Procedure: ABDOMINAL EXPOSURE;  Surgeon: Young Hensen, MD;  Location: [facility] OR;  Service: Vascular;  Laterality: N/A;   ABDOMINAL HYSTERECTOMY  1998   complete   BIOPSY  11/05/2021   Procedure: BIOPSY;  Surgeon: Lindle Rhea, MD;  Location: [facility] ENDOSCOPY;  Service: Gastroenterology;;   BLEPHAROPLASTY Bilateral 01/2018   BROW LIFT Bilateral 10/10/2018   Procedure: Rosary Como LIFT;  Surgeon: Thornell Flirt, DO;  Location: [facility];  Service: Plastics;  Laterality: Bilateral;   BUNIONECTOMY     R 12/08, L 2004 (Dr. Chalmers [place])   CARDIAC CATHETERIZATION     CARPAL TUNNEL RELEASE  1989   bilateral   CARPAL TUNNEL RELEASE Left 03/06/2021   Procedure: Left Revision Carpal  Tunnel Release with hypothenar fat pad flap;  Surgeon: Ltanya Rummer, MD;  Location[facility];  Service: Orthopedics;  Laterality: Left;  with local anesthesia   CATARACT EXTRACTION Bilateral Left in 08/2012, Right 04/2016   Dr.Hecker   CESAREAN SECTION     1981 and 1986   CHOLECYSTECTOMY  1992   laparoscopic   CLOSED REDUCTION NASAL FRACTURE N/A 08/29/2018   Procedure: CLOSED REDUCTION NASAL FRACTURE;  Surgeon: Thornell Flirt, DO;  Location: [facility];  Service: Plastics;  Laterality: N/A;  1 hour, please  COLONOSCOPY  2006   COLONOSCOPY  01/2012   due again 01/2021; mild diverticulosis   CORONARY STENT INTERVENTION N/A 05/19/2021   Procedure: CORONARY STENT INTERVENTION;  Surgeon: Sammy Crisp, MD;  Location: [facility];  Service: Cardiovascular;  Laterality: N/A;   CORONARY ULTRASOUND/IVUS N/A 05/19/2021   Procedure: Intravascular Ultrasound/IVUS;  Surgeon: Sammy Crisp, MD;  Location: [facility];  Service: Cardiovascular;  Laterality: N/A;   COSMETIC SURGERY  2018   epidural steroid injection, back  02/2010   ESOPHAGOGASTRODUODENOSCOPY (EGD) WITH PROPOFOL  N/A 11/05/2021   Procedure: ESOPHAGOGASTRODUODENOSCOPY (EGD) WITH PROPOFOL ;  Surgeon: Lindle Rhea, MD;  Location: [facility] ENDOSCOPY;  Service: Gastroenterology;  Laterality: N/A;   EYE SURGERY  2017   HIP SURGERY     right bursectomy x 3   HIP SURGERY Right 2000   torn cartilage, repaired   INGUINAL HERNIA REPAIR  10/2007   bilat   JOINT REPLACEMENT  04/11/2019   Right shoulder   LEFT HEART CATH AND CORONARY ANGIOGRAPHY N/A 05/19/2021   Procedure: LEFT HEART CATH AND CORONARY ANGIOGRAPHY;  Surgeon: Sammy Crisp, MD;  Location: [facility];  Service: Cardiovascular;  Laterality: N/A;   LUMBAR FUSION  01/2022   NECK SURGERY  1989   c6-7 cervical laminectomy and diskecotmy   REVERSE SHOULDER ARTHROPLASTY Right 04/11/2019   Procedure: REVERSE SHOULDER  ARTHROPLASTY;  Surgeon: Janeth Medicus, MD;  Location: [facility] OR;  Service: Orthopedics;  Laterality: Right;  Regional Block   SHOULDER SURGERY Left 03/2003   left rotator cuff repair   SHOULDER SURGERY Left 03/14/2018   rotator cuff repair; Dr. Leighton Punches   SPINE SURGERY  1989 / lumbar   TONSILLECTOMY  age 19   TOTAL KNEE ARTHROPLASTY Right 03/12/2023   Procedure: TOTAL KNEE ARTHROPLASTY;  Surgeon: Winston Hawking, MD;  Location: [facility] ORS;  Service: Orthopedics;  Laterality: Right;   TROCHANTERIC BURSA EXCISION Right 1978   TUBAL LIGATION  1986   UPPER GASTROINTESTINAL ENDOSCOPY     WISDOM TOOTH EXTRACTION     as teenager    Family History  Problem Relation Age of Onset   Depression Mother    Schizophrenia Mother    Arthritis Mother    Cancer Father        oral   Heart disease Father        bradycardia   Esophageal cancer Father    Cancer Sister        skin and lung   COPD Sister    Hypertension Sister    Osteoporosis Sister        compression fx's x 3 11/2018   Depression Sister    Miscarriages / Stillbirths Sister    Hyperthyroidism Brother    Cancer Brother        metastatic cancer to bone (?primary)   Drug abuse Brother    Learning disabilities Brother    Diabetes Maternal Grandmother    Hearing loss Maternal Grandfather    Cancer Paternal Grandmother 52       colon cancer, metastatic to liver   Colon cancer Paternal Grandmother    Liver cancer Paternal Grandmother    Hyperlipidemia Daughter    Asthma Son    ADD / ADHD Other     Social History   Occupational History   Occupation: STAFF Academic librarian: [employer]   Occupation: Retired  Tobacco Use   Smoking status: Never    Passive exposure: Past   Smokeless tobacco: Never  Vaping Use   Vaping status: Never Used  Substance and Sexual Activity   Alcohol use: Yes    Alcohol/week: 3.0 standard drinks of alcohol    Types: 3 Glasses of wine per week    Comment: occasional   Drug use: Never    Sexual activity: Not Currently    Birth control/protection: Abstinence, Surgical     Physical Exam: Blood pressure (!) 128/54, pulse 65, height 5\' 4"  (1.626 m), weight 151 lb (68.5 kg), SpO2 98%.  Gen:      Nad, well appearing Lungs:  ctab no wheezes or crakles CV:      RRR Neuro: normal speech, no focal asymmetry  Data Reviewed: Imaging: I have personally reviewed the CT Chest May 2021 which shows central and lower lobe bronchiectasis. TBM on expiratory cuts.   PFTs: May 2022 Spirometry WNL.   Sleep study June 2021 IMPRESSIONS - No significant obstructive sleep apnea occurred during this study (AHI = 1.9/h). - No significant central sleep apnea occurred during this study (CAI = 0.7/h). - The patient had minimal or no oxygen desaturation during the study (Min O2 = 89.0%) - The patient snored with soft snoring volume.  Labs: Lab Results  Component Value Date   WBC 5.3 06/10/2023   HGB 13.6 06/10/2023   HCT 40.9 06/10/2023   MCV 97 06/10/2023   PLT 178 06/10/2023   Lab Results  Component Value Date   NA 140 03/13/2023   K 4.3 03/13/2023   CL 104 03/13/2023   CO2 26 03/13/2023  TSH 1.02 TPO 24  Immunization status: Immunization History  Administered Date(s) Administered   Fluad Quad(high Dose 65+) 11/10/2019   Hepatitis A 06/17/2006, 01/05/2007   IPV 04/15/2012   Influenza Split 10/31/2013   Influenza, High Dose Seasonal PF 11/16/2016, 11/16/2017, 11/01/2018, 10/22/2021   Influenza,inj,quad, With Preservative 11/16/2017, 12/12/2018   Influenza-Unspecified 11/09/2014, 11/14/2015, 10/25/2020, 09/19/2022   PFIZER Comirnaty(Gray Top)Covid-19 Tri-Sucrose Vaccine 03/22/2020   PFIZER(Purple Top)SARS-COV-2 Vaccination 02/05/2019, 02/25/2019, 10/19/2019   Pfizer Covid-19 Vaccine Bivalent Booster 30yrs & up 11/07/2020   Pfizer Covid-19 Vaccine Bivalent Booster 5y-11y 11/19/2021   Pneumococcal Conjugate Pcv21, Polysaccharide Crm197 Conjugaf 04/27/2023   Pneumococcal  Conjugate-13 07/20/2013   Pneumococcal Polysaccharide-23 06/17/2006, 11/26/2014, 09/05/2019, 09/21/2022   Rsv, Bivalent, Protein Subunit Rsvpref,pf Pattricia Bores) 10/23/2019   Tdap 09/16/2004, 10/08/2014   Typhoid Live 06/17/2011, 06/10/2017   Unspecified SARS-COV-2 Vaccination 02/20/2019   Yellow Fever 07/08/2012   Zoster Recombinant(Shingrix) 04/28/2016, 08/15/2016   Zoster, Live 04/04/2009    Assessment:  Bronchiectasis without complication, mild, controlled History of pulmonary nocardiosis Chronic vasomotor rhinitis controlled OSA with excessive daytime sleepiness - managed with oral appliance by dentistry.  Chronic cough with LPR, controlled Tracheobronchomalacia (TBM) stable   Plan/Recommendations: continue astelin  and xyzal .  continue oral appliance for OSA.  continue prn albuterol   continue acid reflux medication Continue your IVIG infusions - they have dramatically improved your respiratory infection burden Good luck with endocrinologist - hopefully addressing this will improve fatigue.   Return to Care: Return in about 1 year (around 06/20/2024).  Louie Rover, MD Pulmonary and Critical Care Medicine [facility] Office:[phone]

## 2023-06-21 NOTE — Telephone Encounter (Signed)
 [facility] never reached out to [place].  I referred her to [facility] per her request.  I spoke with [place] and she states they will call her when they get my fax and schedule her.  Kamisha requested Dr. Geanie Keen, however, Lanell Pinta stated he is booked out until September so we decided to get her scheduled with whomever can see her soonest.  Xoey is ok with this plan.

## 2023-06-21 NOTE — Patient Instructions (Signed)
 It was a pleasure to see you today!  Please schedule follow up with myself in 1 year.  If my schedule is not open yet, we will contact you with a reminder closer to that time. Please call [phone] if you haven't heard from us  a month before, and always call us  sooner if issues or concerns arise. You can also send us  a message through MyChart, but but aware that this is not to be used for urgent issues and it may take up to 5-7 days to receive a reply. Please be aware that you will likely be able to view your results before I have a chance to respond to them. Please give us  5 business days to respond to any non-urgent results.    continue astelin  and xyzal .  continue oral appliance for OSA.  continue prn albuterol   continue acid reflux medication Continue your IVIG infusions - they have dramatically improved your respiratory infection burden Good luck with endocrinologist - hopefully addressing this will improve fatigue.

## 2023-07-06 DIAGNOSIS — M25561 Pain in right knee: Secondary | ICD-10-CM | POA: Diagnosis not present

## 2023-07-12 ENCOUNTER — Encounter: Payer: Self-pay | Admitting: Allergy and Immunology

## 2023-07-12 ENCOUNTER — Encounter: Payer: Self-pay | Admitting: Internal Medicine

## 2023-07-12 ENCOUNTER — Other Ambulatory Visit: Payer: Self-pay | Admitting: Internal Medicine

## 2023-07-13 ENCOUNTER — Other Ambulatory Visit (HOSPITAL_COMMUNITY): Payer: Self-pay

## 2023-07-13 ENCOUNTER — Other Ambulatory Visit: Payer: Self-pay

## 2023-07-13 ENCOUNTER — Other Ambulatory Visit: Payer: Self-pay | Admitting: Internal Medicine

## 2023-07-13 DIAGNOSIS — A6000 Herpesviral infection of urogenital system, unspecified: Secondary | ICD-10-CM

## 2023-07-13 MED ORDER — VALACYCLOVIR HCL 500 MG PO TABS
500.0000 mg | ORAL_TABLET | Freq: Every day | ORAL | 1 refills | Status: DC
  Filled 2023-07-13 – 2023-09-01 (×2): qty 90, 90d supply, fill #0
  Filled 2023-11-25: qty 90, 90d supply, fill #1

## 2023-07-13 MED ORDER — VALACYCLOVIR HCL 1 G PO TABS
1000.0000 mg | ORAL_TABLET | Freq: Two times a day (BID) | ORAL | 0 refills | Status: AC
  Filled 2023-07-13: qty 28, 14d supply, fill #0

## 2023-07-14 DIAGNOSIS — L2989 Other pruritus: Secondary | ICD-10-CM | POA: Diagnosis not present

## 2023-07-14 DIAGNOSIS — L821 Other seborrheic keratosis: Secondary | ICD-10-CM | POA: Diagnosis not present

## 2023-07-14 DIAGNOSIS — D1801 Hemangioma of skin and subcutaneous tissue: Secondary | ICD-10-CM | POA: Diagnosis not present

## 2023-07-14 DIAGNOSIS — Z85828 Personal history of other malignant neoplasm of skin: Secondary | ICD-10-CM | POA: Diagnosis not present

## 2023-07-14 DIAGNOSIS — L538 Other specified erythematous conditions: Secondary | ICD-10-CM | POA: Diagnosis not present

## 2023-07-14 DIAGNOSIS — Z86007 Personal history of in-situ neoplasm of skin: Secondary | ICD-10-CM | POA: Diagnosis not present

## 2023-07-14 DIAGNOSIS — L820 Inflamed seborrheic keratosis: Secondary | ICD-10-CM | POA: Diagnosis not present

## 2023-07-14 DIAGNOSIS — Z08 Encounter for follow-up examination after completed treatment for malignant neoplasm: Secondary | ICD-10-CM | POA: Diagnosis not present

## 2023-07-14 DIAGNOSIS — L814 Other melanin hyperpigmentation: Secondary | ICD-10-CM | POA: Diagnosis not present

## 2023-07-15 DIAGNOSIS — L570 Actinic keratosis: Secondary | ICD-10-CM | POA: Diagnosis not present

## 2023-07-15 DIAGNOSIS — L905 Scar conditions and fibrosis of skin: Secondary | ICD-10-CM | POA: Diagnosis not present

## 2023-07-15 DIAGNOSIS — L578 Other skin changes due to chronic exposure to nonionizing radiation: Secondary | ICD-10-CM | POA: Diagnosis not present

## 2023-07-26 NOTE — Telephone Encounter (Signed)
 Beverly [name] has an appointment on 11/29/2023 at 4:00 pm with [facility].

## 2023-07-27 ENCOUNTER — Other Ambulatory Visit: Payer: Self-pay

## 2023-07-27 ENCOUNTER — Other Ambulatory Visit (HOSPITAL_COMMUNITY): Payer: Self-pay

## 2023-07-29 DIAGNOSIS — Z96651 Presence of right artificial knee joint: Secondary | ICD-10-CM | POA: Diagnosis not present

## 2023-07-29 DIAGNOSIS — Z471 Aftercare following joint replacement surgery: Secondary | ICD-10-CM | POA: Diagnosis not present

## 2023-07-31 ENCOUNTER — Other Ambulatory Visit (HOSPITAL_COMMUNITY): Payer: Self-pay

## 2023-08-04 ENCOUNTER — Other Ambulatory Visit (HOSPITAL_COMMUNITY): Payer: Self-pay

## 2023-08-04 DIAGNOSIS — M47816 Spondylosis without myelopathy or radiculopathy, lumbar region: Secondary | ICD-10-CM | POA: Diagnosis not present

## 2023-08-04 DIAGNOSIS — M150 Primary generalized (osteo)arthritis: Secondary | ICD-10-CM | POA: Diagnosis not present

## 2023-08-04 DIAGNOSIS — G894 Chronic pain syndrome: Secondary | ICD-10-CM | POA: Diagnosis not present

## 2023-08-04 DIAGNOSIS — Z79891 Long term (current) use of opiate analgesic: Secondary | ICD-10-CM | POA: Diagnosis not present

## 2023-08-04 MED ORDER — BELBUCA 750 MCG BU FILM
750.0000 ug | ORAL_FILM | Freq: Two times a day (BID) | BUCCAL | 2 refills | Status: DC
  Filled 2023-08-24: qty 60, 30d supply, fill #0
  Filled 2023-09-26: qty 60, 30d supply, fill #1

## 2023-08-05 ENCOUNTER — Other Ambulatory Visit (HOSPITAL_COMMUNITY): Payer: Self-pay

## 2023-08-06 DIAGNOSIS — D485 Neoplasm of uncertain behavior of skin: Secondary | ICD-10-CM | POA: Diagnosis not present

## 2023-08-06 DIAGNOSIS — C4441 Basal cell carcinoma of skin of scalp and neck: Secondary | ICD-10-CM | POA: Diagnosis not present

## 2023-08-24 ENCOUNTER — Other Ambulatory Visit (HOSPITAL_COMMUNITY): Payer: Self-pay

## 2023-08-24 ENCOUNTER — Other Ambulatory Visit: Payer: Self-pay

## 2023-08-24 ENCOUNTER — Ambulatory Visit: Payer: Medicare PPO | Admitting: Allergy and Immunology

## 2023-08-25 ENCOUNTER — Encounter: Payer: Self-pay | Admitting: Allergy and Immunology

## 2023-08-30 NOTE — Telephone Encounter (Signed)
 fyi

## 2023-09-01 ENCOUNTER — Other Ambulatory Visit (HOSPITAL_COMMUNITY): Payer: Self-pay

## 2023-09-01 ENCOUNTER — Other Ambulatory Visit: Payer: Self-pay

## 2023-09-01 ENCOUNTER — Other Ambulatory Visit: Payer: Self-pay | Admitting: Allergy and Immunology

## 2023-09-01 ENCOUNTER — Other Ambulatory Visit: Payer: Self-pay | Admitting: Internal Medicine

## 2023-09-01 ENCOUNTER — Other Ambulatory Visit: Payer: Self-pay | Admitting: Cardiovascular Disease

## 2023-09-01 DIAGNOSIS — E039 Hypothyroidism, unspecified: Secondary | ICD-10-CM

## 2023-09-01 MED ORDER — PANTOPRAZOLE SODIUM 40 MG PO TBEC
40.0000 mg | DELAYED_RELEASE_TABLET | Freq: Two times a day (BID) | ORAL | 1 refills | Status: DC
  Filled 2023-09-01: qty 180, 90d supply, fill #0

## 2023-09-01 MED ORDER — MONTELUKAST SODIUM 10 MG PO TABS
10.0000 mg | ORAL_TABLET | Freq: Every evening | ORAL | 1 refills | Status: DC
  Filled 2023-09-01 – 2023-11-01 (×2): qty 90, 90d supply, fill #0

## 2023-09-01 MED ORDER — LEVOTHYROXINE SODIUM 25 MCG PO TABS
25.0000 ug | ORAL_TABLET | Freq: Every day | ORAL | 0 refills | Status: DC
  Filled 2023-09-01 – 2023-10-19 (×2): qty 90, 90d supply, fill #0

## 2023-09-02 ENCOUNTER — Other Ambulatory Visit (HOSPITAL_COMMUNITY): Payer: Self-pay

## 2023-09-02 ENCOUNTER — Other Ambulatory Visit: Payer: Self-pay

## 2023-09-02 MED ORDER — NITROGLYCERIN 0.4 MG SL SUBL
0.4000 mg | SUBLINGUAL_TABLET | SUBLINGUAL | 5 refills | Status: AC | PRN
  Filled 2023-09-02: qty 25, 30d supply, fill #0
  Filled 2023-12-05: qty 25, 8d supply, fill #1

## 2023-09-06 DIAGNOSIS — D485 Neoplasm of uncertain behavior of skin: Secondary | ICD-10-CM | POA: Diagnosis not present

## 2023-09-06 DIAGNOSIS — C44719 Basal cell carcinoma of skin of left lower limb, including hip: Secondary | ICD-10-CM | POA: Diagnosis not present

## 2023-09-06 DIAGNOSIS — L244 Irritant contact dermatitis due to drugs in contact with skin: Secondary | ICD-10-CM | POA: Diagnosis not present

## 2023-09-06 DIAGNOSIS — D0471 Carcinoma in situ of skin of right lower limb, including hip: Secondary | ICD-10-CM | POA: Diagnosis not present

## 2023-09-08 ENCOUNTER — Other Ambulatory Visit (HOSPITAL_COMMUNITY): Payer: Self-pay

## 2023-09-08 ENCOUNTER — Other Ambulatory Visit: Payer: Self-pay

## 2023-09-08 MED ORDER — VENLAFAXINE HCL ER 75 MG PO CP24
75.0000 mg | ORAL_CAPSULE | Freq: Every day | ORAL | 2 refills | Status: AC
  Filled 2023-09-08: qty 90, 90d supply, fill #0
  Filled 2023-11-29: qty 90, 90d supply, fill #1
  Filled 2024-02-28: qty 90, 90d supply, fill #2

## 2023-09-15 ENCOUNTER — Other Ambulatory Visit (HOSPITAL_COMMUNITY): Payer: Self-pay

## 2023-09-16 ENCOUNTER — Other Ambulatory Visit (HOSPITAL_COMMUNITY): Payer: Self-pay

## 2023-09-26 ENCOUNTER — Other Ambulatory Visit (HOSPITAL_COMMUNITY): Payer: Self-pay

## 2023-09-26 ENCOUNTER — Encounter: Payer: Self-pay | Admitting: Allergy and Immunology

## 2023-09-27 ENCOUNTER — Other Ambulatory Visit (HOSPITAL_COMMUNITY): Payer: Self-pay

## 2023-09-28 ENCOUNTER — Other Ambulatory Visit: Payer: Self-pay

## 2023-09-29 ENCOUNTER — Encounter: Payer: Self-pay | Admitting: Pharmacist

## 2023-09-29 ENCOUNTER — Other Ambulatory Visit (HOSPITAL_COMMUNITY): Payer: Self-pay

## 2023-09-29 ENCOUNTER — Other Ambulatory Visit: Payer: Self-pay

## 2023-09-29 MED ORDER — PREDNISONE 10 MG PO TABS
10.0000 mg | ORAL_TABLET | Freq: Every day | ORAL | 0 refills | Status: DC
  Filled 2023-09-29 (×2): qty 7, 7d supply, fill #0

## 2023-09-29 MED ORDER — AMOXICILLIN-POT CLAVULANATE 875-125 MG PO TABS
ORAL_TABLET | ORAL | 0 refills | Status: DC
  Filled 2023-09-29: qty 21, 7d supply, fill #0

## 2023-09-30 ENCOUNTER — Other Ambulatory Visit (HOSPITAL_COMMUNITY): Payer: Self-pay

## 2023-09-30 ENCOUNTER — Other Ambulatory Visit: Payer: Self-pay

## 2023-09-30 DIAGNOSIS — G894 Chronic pain syndrome: Secondary | ICD-10-CM | POA: Diagnosis not present

## 2023-09-30 DIAGNOSIS — Z79891 Long term (current) use of opiate analgesic: Secondary | ICD-10-CM | POA: Diagnosis not present

## 2023-09-30 DIAGNOSIS — M150 Primary generalized (osteo)arthritis: Secondary | ICD-10-CM | POA: Diagnosis not present

## 2023-09-30 DIAGNOSIS — M47816 Spondylosis without myelopathy or radiculopathy, lumbar region: Secondary | ICD-10-CM | POA: Diagnosis not present

## 2023-09-30 MED ORDER — HYDROCODONE-ACETAMINOPHEN 7.5-325 MG PO TABS
1.0000 | ORAL_TABLET | Freq: Four times a day (QID) | ORAL | 0 refills | Status: AC | PRN
  Filled 2023-09-30: qty 120, 30d supply, fill #0

## 2023-09-30 MED ORDER — BELBUCA 750 MCG BU FILM
750.0000 ug | ORAL_FILM | Freq: Two times a day (BID) | BUCCAL | 2 refills | Status: AC
  Filled 2023-11-01: qty 60, 30d supply, fill #0
  Filled 2024-01-02: qty 60, 30d supply, fill #1
  Filled 2024-03-03: qty 60, 30d supply, fill #2

## 2023-10-01 ENCOUNTER — Encounter: Payer: Self-pay | Admitting: Internal Medicine

## 2023-10-04 ENCOUNTER — Other Ambulatory Visit: Payer: Self-pay | Admitting: Internal Medicine

## 2023-10-04 ENCOUNTER — Other Ambulatory Visit (HOSPITAL_COMMUNITY): Payer: Self-pay

## 2023-10-04 ENCOUNTER — Other Ambulatory Visit: Payer: Self-pay

## 2023-10-04 MED ORDER — OMEGA-3-ACID ETHYL ESTERS 1 G PO CAPS
2.0000 g | ORAL_CAPSULE | Freq: Two times a day (BID) | ORAL | 1 refills | Status: AC
  Filled 2023-10-04: qty 360, 90d supply, fill #0
  Filled 2024-01-04: qty 360, 90d supply, fill #1

## 2023-10-06 ENCOUNTER — Other Ambulatory Visit (HOSPITAL_COMMUNITY): Payer: Self-pay

## 2023-10-06 DIAGNOSIS — D0471 Carcinoma in situ of skin of right lower limb, including hip: Secondary | ICD-10-CM | POA: Diagnosis not present

## 2023-10-06 DIAGNOSIS — L570 Actinic keratosis: Secondary | ICD-10-CM | POA: Diagnosis not present

## 2023-10-06 MED ORDER — DOXYCYCLINE HYCLATE 100 MG PO CAPS
100.0000 mg | ORAL_CAPSULE | Freq: Two times a day (BID) | ORAL | 0 refills | Status: DC
  Filled 2023-10-06: qty 20, 10d supply, fill #0

## 2023-10-06 MED ORDER — ISOSORBIDE MONONITRATE ER 30 MG PO TB24
15.0000 mg | ORAL_TABLET | Freq: Every day | ORAL | 3 refills | Status: DC
  Filled 2023-10-06: qty 45, 90d supply, fill #0

## 2023-10-06 NOTE — Telephone Encounter (Signed)
 Patient aware of Dr. Claiborne advisement. Sent Imdur  15 mg by mouth daily to patient's pharmacy. Patient agreed to plan.

## 2023-10-06 NOTE — Telephone Encounter (Signed)
 Called patient back about message. Informed patient that if this happens again to go to the ED. Got patient an appointment with [facility]OD on Friday, first available. Will send message to Dr. Nishan for advisement.

## 2023-10-07 DIAGNOSIS — R072 Precordial pain: Secondary | ICD-10-CM | POA: Insufficient documentation

## 2023-10-07 DIAGNOSIS — I471 Supraventricular tachycardia, unspecified: Secondary | ICD-10-CM | POA: Insufficient documentation

## 2023-10-07 DIAGNOSIS — E785 Hyperlipidemia, unspecified: Secondary | ICD-10-CM | POA: Insufficient documentation

## 2023-10-07 NOTE — H&P (View-Only) (Signed)
 Cardiology Office Note:   Date:  10/08/2023  ID:  Rachell, Druckenmiller May 21, 1945, MRN [id number] PCP: Theophilus Andrews, Tully GRADE, MD  [facility] Providers Cardiologist:  Maude Emmer, MD {  History of Present Illness:   Beverly [name] is a 78 y.o. female with history of CAD.  Coronary CTA and was suggestive of tight LAD and ramus. She had a cardiac cath 05/19/21 with DES LAD and DES ramus.  She followed by Dr. Emmer.  She is added to my schedule because of chest pain.     She never really had chest pain previously with her stenting.  She lives alone.  She does some mild yard work and takes care of her own activities.  The other day she developed midsternal chest discomfort.  It was not like her previous reflux.  It was 6 out of 10 in intensity.  There was some associated diaphoresis.  It was at rest.  There was no radiation to her jaw or to her arms.  She took some Tums.  Eventually it went away.  She became more concerned when a couple of days ago she developed some jaw discomfort that went away easing after 1 nitroglycerin  and then went away after the second.  This is the first time she has taken nitroglycerin .  She has not had any nausea or vomiting.  She has not had any new shortness of breath, PND or orthopnea.  She has had no weight gain or edema.  ROS: As stated in the HPI and negative for all other systems.  Studies Reviewed:    EKG:   EKG Interpretation Date/Time:  Friday October 08 2023 14:24:28 EDT Ventricular Rate:  59 PR Interval:  234 QRS Duration:  88 QT Interval:  424 QTC Calculation: 419 R Axis:   106  Text Interpretation: Sinus bradycardia with sinus arrhythmia with 1st degree A-V block Low voltage QRS Possible Anterolateral infarct (cited on or before 20-May-2021) When compared with ECG of 26-Jan-2023 11:38, No significant change since last tracing Confirmed by Lavona Agent ([id number]) on 10/08/2023 2:28:40 PM     Risk Assessment/Calculations:        Physical Exam:   VS:  BP (!) 166/74   Pulse (!) 59   Ht 5' 4 (1.626 m)   Wt 155 lb (70.3 kg)   SpO2 95%   BMI 26.61 kg/m    Wt Readings from Last 3 Encounters:  10/08/23 155 lb (70.3 kg)  06/21/23 151 lb (68.5 kg)  05/11/23 150 lb 12.8 oz (68.4 kg)     GEN: Well nourished, well developed in no acute distress NECK: No JVD; No carotid bruits CARDIAC: RRR, no murmurs, rubs, gallops RESPIRATORY:  Clear to auscultation without rales, wheezing or rhonchi  ABDOMEN: Soft, non-tender, non-distended EXTREMITIES:  No edema; No deformity   ASSESSMENT AND PLAN:      CAD: The patient's symptoms are worrisome for new onset angina.  I am going to have her start an aspirin  81 mg.  She will continue on her Plavix .  I am going to have her increase her Imdur  to 60 mg daily.  She has her sublingual nitroglycerin .  She is to come to the ER with any symptoms over the weekend.  I am going to schedule her for a cardiac cath as the pretest probability of obstructive coronary disease is high.  I did look at her previous catheterization and she had a large area of tight stenosis and a long stent in the  LAD.  She also had disease in her ramus intermediate treated.   The patient understands that risks included but are not limited to stroke (1 in 1000), death (1 in 1000), kidney failure [usually temporary] (1 in 500), bleeding (1 in 200), allergic reaction [possibly serious] (1 in 200).  The patient understands and agrees to proceed. ]  Chest pain: As above.   GI bleed now on Plavix :   She has had no recent bleeding.  I will start aspirin  in anticipation of the cath and she will continue the Plavix .  ILD: She has chronic lung disease and chronic dyspnea but this has not been particularly problematic.  SVT/nocturnal bradycardia: She has had no symptomatic tachypalpitations.  No change in therapy.   Dyslipidemia: LDL 67 and HDL of 46.  No change in therapy.   OSA: She has used a mouthguard for this.      Follow up with Dr. Delford after the cath.   Signed, Lynwood Schilling, MD

## 2023-10-07 NOTE — Progress Notes (Unsigned)
 Cardiology Office Note:   Date:  10/08/2023  ID:  Beverly, [name] 1946-01-11, MRN [id number] PCP: Beverly [name], Tully GRADE, MD  [facility] Providers Cardiologist:  Beverly Emmer, MD {  History of Present Illness:   Beverly [name] is a 78 y.o. female with history of CAD.  Coronary CTA and was suggestive of tight LAD and ramus. She had a cardiac cath 05/19/21 with DES LAD and DES ramus.  She followed by Dr. Emmer.  She is added to my schedule because of chest pain.     She never really had chest pain previously with her stenting.  She lives alone.  She does some mild yard work and takes care of her own activities.  The other day she developed midsternal chest discomfort.  It was not like her previous reflux.  It was 6 out of 10 in intensity.  There was some associated diaphoresis.  It was at rest.  There was no radiation to her jaw or to her arms.  She took some Tums.  Eventually it went away.  She became more concerned when a couple of days ago she developed some jaw discomfort that went away easing after 1 nitroglycerin  and then went away after the second.  This is the first time she has taken nitroglycerin .  She has not had any nausea or vomiting.  She has not had any new shortness of breath, PND or orthopnea.  She has had no weight gain or edema.  ROS: As stated in the HPI and negative for all other systems.  Studies Reviewed:    EKG:   EKG Interpretation Date/Time:  Friday October 08 2023 14:24:28 EDT Ventricular Rate:  59 PR Interval:  234 QRS Duration:  88 QT Interval:  424 QTC Calculation: 419 R Axis:   106  Text Interpretation: Sinus bradycardia with sinus arrhythmia with 1st degree A-V block Low voltage QRS Possible Anterolateral infarct (cited on or before 20-May-2021) When compared with ECG of 26-Jan-2023 11:38, No significant change since last tracing Confirmed by Beverly [name] Agent ([id number]) on 10/08/2023 2:28:40 PM     Risk Assessment/Calculations:        Physical Exam:   VS:  BP (!) 166/74   Pulse (!) 59   Ht 5' 4 (1.626 m)   Wt 155 lb (70.3 kg)   SpO2 95%   BMI 26.61 kg/m    Wt Readings from Last 3 Encounters:  10/08/23 155 lb (70.3 kg)  06/21/23 151 lb (68.5 kg)  05/11/23 150 lb 12.8 oz (68.4 kg)     GEN: Well nourished, well developed in no acute distress NECK: No JVD; No carotid bruits CARDIAC: RRR, no murmurs, rubs, gallops RESPIRATORY:  Clear to auscultation without rales, wheezing or rhonchi  ABDOMEN: Soft, non-tender, non-distended EXTREMITIES:  No edema; No deformity   ASSESSMENT AND PLAN:      CAD: The patient's symptoms are worrisome for new onset angina.  I am going to have her start an aspirin  81 mg.  She will continue on her Plavix .  I am going to have her increase her Imdur  to 60 mg daily.  She has her sublingual nitroglycerin .  She is to come to the ER with any symptoms over the weekend.  I am going to schedule her for a cardiac cath as the pretest probability of obstructive coronary disease is high.  I did look at her previous catheterization and she had a large area of tight stenosis and a long stent in the  LAD.  She also had disease in her ramus intermediate treated.   The patient understands that risks included but are not limited to stroke (1 in 1000), death (1 in 1000), kidney failure [usually temporary] (1 in 500), bleeding (1 in 200), allergic reaction [possibly serious] (1 in 200).  The patient understands and agrees to proceed. ]  Chest pain: As above.   GI bleed now on Plavix :   She has had no recent bleeding.  I will start aspirin  in anticipation of the cath and she will continue the Plavix .  ILD: She has chronic lung disease and chronic dyspnea but this has not been particularly problematic.  SVT/nocturnal bradycardia: She has had no symptomatic tachypalpitations.  No change in therapy.   Dyslipidemia: LDL 67 and HDL of 46.  No change in therapy.   OSA: She has used a mouthguard for this.      Follow up with Dr. Delford after the cath.   Signed, Beverly Schilling, MD

## 2023-10-08 ENCOUNTER — Encounter: Payer: Self-pay | Admitting: Cardiology

## 2023-10-08 ENCOUNTER — Ambulatory Visit: Attending: Cardiology | Admitting: Cardiology

## 2023-10-08 ENCOUNTER — Other Ambulatory Visit (HOSPITAL_COMMUNITY): Payer: Self-pay

## 2023-10-08 VITALS — BP 166/74 | HR 59 | Ht 64.0 in | Wt 155.0 lb

## 2023-10-08 DIAGNOSIS — I2510 Atherosclerotic heart disease of native coronary artery without angina pectoris: Secondary | ICD-10-CM

## 2023-10-08 DIAGNOSIS — G4733 Obstructive sleep apnea (adult) (pediatric): Secondary | ICD-10-CM

## 2023-10-08 DIAGNOSIS — I471 Supraventricular tachycardia, unspecified: Secondary | ICD-10-CM

## 2023-10-08 DIAGNOSIS — E785 Hyperlipidemia, unspecified: Secondary | ICD-10-CM | POA: Diagnosis not present

## 2023-10-08 DIAGNOSIS — R072 Precordial pain: Secondary | ICD-10-CM

## 2023-10-08 MED ORDER — ISOSORBIDE MONONITRATE ER 60 MG PO TB24
60.0000 mg | ORAL_TABLET | Freq: Every day | ORAL | 3 refills | Status: DC
  Filled 2023-10-08 – 2023-10-11 (×2): qty 90, 90d supply, fill #0

## 2023-10-08 MED ORDER — ISOSORBIDE MONONITRATE ER 60 MG PO TB24: 60.0000 mg | ORAL_TABLET | Freq: Every day | ORAL | 3 refills | Status: DC

## 2023-10-08 NOTE — Patient Instructions (Signed)
 Medication Instructions:  Start Aspirin  81 mg daily Increase Imdur  to 60 mg daily Continue all other medications *If you need a refill on your cardiac medications before your next appointment, please call your pharmacy*  Lab Work: Bmet,cbc today  Testing/Procedures: Cardiac Cath scheduled at [facility] Tuesday 8/26 Arrive at 11:30 am  Follow instructions below  Follow-Up: At [facility], you and your health needs are our priority.  As part of our continuing mission to provide you with exceptional heart care, our providers are all part of one team.  This team includes your primary Cardiologist (physician) and Advanced Practice Providers or APPs (Physician Assistants and Nurse Practitioners) who all work together to provide you with the care you need, when you need it.  Your next appointment: After Cath    Provider:  Dr.Hochrein   [facility] OF [facility] AT [address] A DEPT OF THE [place] [address] [place] [address] Dept: [phone] Loc: [phone]  Beverly [name]  10/08/2023  You are scheduled for a Cardiac Catheterization on Tuesday, August 26 with Dr. [name] Swaz[place].  1. Please arrive at the [facility] (Main Entrance A) at [facility]: [address] [place], [place] [address] at 11:30 AM (This time is 2 hour(s) before your procedure to ensure your preparation).   Free valet parking service is available. You will check in at ADMITTING. The support person will be asked to wait in the waiting room.  It is OK to have someone drop you off and come back when you are ready to be discharged.    Special note: Every effort is made to have your procedure done on time. Please understand that emergencies sometimes delay scheduled procedures.  2. Diet: Nothing to eat after midnight You may have water    3. Hydration: You need to be well hydrated before your procedure. you may drink approved  liquids (see below) until 2 hours before the procedure, with 16 oz of bottle water  on way to hospital  List of approved liquids water , clear juice, clear tea, black coffee, fruit juices, non-citric and without pulp, carbonated beverages, Gatorade, Kool -Aid, plain Jello-O and plain ice popsicles.  4. Labs: You will need to have blood drawn on Today bmet,cbc  5. Medication instructions in preparation for your procedure:      On the morning of your procedure, take your Aspirin  81 mg and any morning medicines NOT listed above.  You may use sips of water .  6. Plan to go home the same day, you will only stay overnight if medically necessary. 7. Bring a current list of your medications and current insurance cards. 8. You MUST have a responsible person to drive you home. 9. Someone MUST be with you the first 24 hours after you arrive home or your discharge will be delayed. 10. Please wear clothes that are easy to get on and off and wear slip-on shoes.  Thank you for allowing us  to care for you!   -- [facility]  We recommend signing up for the patient portal called MyChart.  Sign up information is provided on this After Visit Summary.  MyChart is used to connect with patients for Virtual Visits (Telemedicine).  Patients are able to view lab/test results, encounter notes, upcoming appointments, etc.  Non-urgent messages can be sent to your provider as well.   To learn more about what you can do with  MyChart, go to [email or web address].

## 2023-10-09 LAB — BASIC METABOLIC PANEL WITH GFR
BUN/Creatinine Ratio: 17 (ref 12–28)
BUN: 11 mg/dL (ref 8–27)
CO2: 23 mmol/L (ref 20–29)
Calcium: 9.4 mg/dL (ref 8.7–10.3)
Chloride: 103 mmol/L (ref 96–106)
Creatinine, Ser: 0.65 mg/dL (ref 0.57–1.00)
Glucose: 91 mg/dL (ref 70–99)
Potassium: 4.6 mmol/L (ref 3.5–5.2)
Sodium: 142 mmol/L (ref 134–144)
eGFR: 92 mL/min/1.73 (ref >59)

## 2023-10-09 LAB — CBC WITH DIFFERENTIAL/PLATELET
Basophils Absolute: 0.1 x10E3/uL (ref 0.0–0.2)
Basos: 1 %
EOS (ABSOLUTE): 0.3 x10E3/uL (ref 0.0–0.4)
Eos: 4 %
Hematocrit: 43.5 % (ref 34.0–46.6)
Hemoglobin: 14.4 g/dL (ref 11.1–15.9)
Immature Grans (Abs): 0.2 x10E3/uL — ABNORMAL HIGH (ref 0.0–0.1)
Immature Granulocytes: 2 %
Lymphocytes Absolute: 1.6 x10E3/uL (ref 0.7–3.1)
Lymphs: 23 %
MCH: 33 pg (ref 26.6–33.0)
MCHC: 33.1 g/dL (ref 31.5–35.7)
MCV: 100 fL — ABNORMAL HIGH (ref 79–97)
Monocytes Absolute: 0.5 x10E3/uL (ref 0.1–0.9)
Monocytes: 8 %
Neutrophils Absolute: 4.2 x10E3/uL (ref 1.4–7.0)
Neutrophils: 62 %
Platelets: 175 x10E3/uL (ref 150–450)
RBC: 4.37 x10E6/uL (ref 3.77–5.28)
RDW: 14 % (ref 11.7–15.4)
WBC: 6.8 x10E3/uL (ref 3.4–10.8)

## 2023-10-10 ENCOUNTER — Ambulatory Visit: Payer: Self-pay | Admitting: Cardiology

## 2023-10-11 ENCOUNTER — Other Ambulatory Visit: Payer: Self-pay

## 2023-10-11 ENCOUNTER — Other Ambulatory Visit (HOSPITAL_BASED_OUTPATIENT_CLINIC_OR_DEPARTMENT_OTHER): Payer: Self-pay

## 2023-10-11 ENCOUNTER — Other Ambulatory Visit: Payer: Self-pay | Admitting: *Deleted

## 2023-10-11 ENCOUNTER — Other Ambulatory Visit (HOSPITAL_COMMUNITY): Payer: Self-pay

## 2023-10-11 DIAGNOSIS — I2510 Atherosclerotic heart disease of native coronary artery without angina pectoris: Secondary | ICD-10-CM

## 2023-10-11 DIAGNOSIS — R079 Chest pain, unspecified: Secondary | ICD-10-CM

## 2023-10-12 ENCOUNTER — Other Ambulatory Visit: Payer: Self-pay

## 2023-10-12 ENCOUNTER — Ambulatory Visit (HOSPITAL_COMMUNITY)
Admission: RE | Admit: 2023-10-12 | Discharge: 2023-10-12 | Disposition: A | Discharge status: Home / Self Care | Attending: Cardiology | Admitting: Cardiology

## 2023-10-12 ENCOUNTER — Encounter (HOSPITAL_COMMUNITY)
Admission: RE | Disposition: A | Discharge status: Home / Self Care | Payer: Self-pay | Source: Home / Self Care | Attending: Cardiology

## 2023-10-12 DIAGNOSIS — E785 Hyperlipidemia, unspecified: Secondary | ICD-10-CM | POA: Diagnosis not present

## 2023-10-12 DIAGNOSIS — Z7902 Long term (current) use of antithrombotics/antiplatelets: Secondary | ICD-10-CM | POA: Insufficient documentation

## 2023-10-12 DIAGNOSIS — R079 Chest pain, unspecified: Secondary | ICD-10-CM | POA: Diagnosis present

## 2023-10-12 DIAGNOSIS — J849 Interstitial pulmonary disease, unspecified: Secondary | ICD-10-CM | POA: Diagnosis not present

## 2023-10-12 DIAGNOSIS — Z79899 Other long term (current) drug therapy: Secondary | ICD-10-CM | POA: Insufficient documentation

## 2023-10-12 DIAGNOSIS — Z7982 Long term (current) use of aspirin: Secondary | ICD-10-CM | POA: Diagnosis not present

## 2023-10-12 DIAGNOSIS — G4733 Obstructive sleep apnea (adult) (pediatric): Secondary | ICD-10-CM | POA: Insufficient documentation

## 2023-10-12 DIAGNOSIS — I2510 Atherosclerotic heart disease of native coronary artery without angina pectoris: Secondary | ICD-10-CM | POA: Diagnosis not present

## 2023-10-12 DIAGNOSIS — Z955 Presence of coronary angioplasty implant and graft: Secondary | ICD-10-CM | POA: Diagnosis not present

## 2023-10-12 DIAGNOSIS — I471 Supraventricular tachycardia, unspecified: Secondary | ICD-10-CM | POA: Insufficient documentation

## 2023-10-12 HISTORY — PX: LEFT HEART CATH AND CORONARY ANGIOGRAPHY: CATH118249

## 2023-10-12 SURGERY — LEFT HEART CATH AND CORONARY ANGIOGRAPHY
Anesthesia: LOCAL

## 2023-10-12 MED ORDER — ONDANSETRON HCL 4 MG/2ML IJ SOLN
4.0000 mg | Freq: Four times a day (QID) | INTRAMUSCULAR | Status: DC | PRN
Start: 2023-10-12 — End: 2023-10-12

## 2023-10-12 MED ORDER — FENTANYL CITRATE (PF) 100 MCG/2ML IJ SOLN
INTRAMUSCULAR | Status: DC | PRN
  Administered 2023-10-12: 25 ug via INTRAVENOUS

## 2023-10-12 MED ORDER — FENTANYL CITRATE (PF) 100 MCG/2ML IJ SOLN
INTRAMUSCULAR | Status: AC
  Filled 2023-10-12: qty 2

## 2023-10-12 MED ORDER — SODIUM CHLORIDE 0.9 % IV SOLN: 250.0000 mL | INTRAVENOUS | Status: DC | PRN

## 2023-10-12 MED ORDER — VERAPAMIL HCL 2.5 MG/ML IV SOLN
INTRAVENOUS | Status: AC
  Filled 2023-10-12: qty 2

## 2023-10-12 MED ORDER — FREE WATER: 500.0000 mL | Freq: Once | Status: DC

## 2023-10-12 MED ORDER — HEPARIN (PORCINE) IN NACL 1000-0.9 UT/500ML-% IV SOLN
INTRAVENOUS | Status: DC | PRN
Start: 2023-10-12 — End: 2023-10-12
  Administered 2023-10-12: 1000 mL via SURGICAL_CAVITY

## 2023-10-12 MED ORDER — SODIUM CHLORIDE 0.9% FLUSH: 3.0000 mL | INTRAVENOUS | Status: DC | PRN

## 2023-10-12 MED ORDER — LIDOCAINE HCL (PF) 1 % IJ SOLN
INTRAMUSCULAR | Status: AC
Start: 2023-10-12 — End: 2023-10-12
  Filled 2023-10-12: qty 30

## 2023-10-12 MED ORDER — MIDAZOLAM HCL 2 MG/2ML IJ SOLN
INTRAMUSCULAR | Status: DC | PRN
  Administered 2023-10-12: 1 mg via INTRAVENOUS

## 2023-10-12 MED ORDER — MIDAZOLAM HCL 2 MG/2ML IJ SOLN
INTRAMUSCULAR | Status: AC
Start: 2023-10-12 — End: 2023-10-12
  Filled 2023-10-12: qty 2

## 2023-10-12 MED ORDER — HEPARIN SODIUM (PORCINE) 1000 UNIT/ML IJ SOLN
INTRAMUSCULAR | Status: DC | PRN
Start: 2023-10-12 — End: 2023-10-12
  Administered 2023-10-12: 3500 [IU] via INTRAVENOUS

## 2023-10-12 MED ORDER — IOHEXOL 350 MG/ML SOLN
INTRAVENOUS | Status: DC | PRN
  Administered 2023-10-12: 30 mL via INTRA_ARTERIAL

## 2023-10-12 MED ORDER — ACETAMINOPHEN 325 MG PO TABS: 650.0000 mg | ORAL_TABLET | ORAL | Status: DC | PRN

## 2023-10-12 MED ORDER — SODIUM CHLORIDE 0.9% FLUSH: 3.0000 mL | Freq: Two times a day (BID) | INTRAVENOUS | Status: DC

## 2023-10-12 MED ORDER — VERAPAMIL HCL 2.5 MG/ML IV SOLN
INTRAVENOUS | Status: DC | PRN
  Administered 2023-10-12: 10 mL via INTRA_ARTERIAL

## 2023-10-12 MED ORDER — HEPARIN SODIUM (PORCINE) 1000 UNIT/ML IJ SOLN
INTRAMUSCULAR | Status: AC
  Filled 2023-10-12: qty 10

## 2023-10-12 MED ORDER — ASPIRIN 81 MG PO CHEW: 81.0000 mg | CHEWABLE_TABLET | Freq: Once | ORAL | Status: DC

## 2023-10-12 MED ORDER — LIDOCAINE HCL (PF) 1 % IJ SOLN
INTRAMUSCULAR | Status: DC | PRN
  Administered 2023-10-12: 2 mL

## 2023-10-12 SURGICAL SUPPLY — 7 items
CATH 5FR JL3.5 JR4 ANG PIG MP (CATHETERS) IMPLANT
DEVICE RAD COMP TR BAND LRG (VASCULAR PRODUCTS) IMPLANT
GLIDESHEATH SLEND SS 6F .021 (SHEATH) IMPLANT
GUIDEWIRE INQWIRE 1.5J.035X260 (WIRE) IMPLANT
PACK CARDIAC CATHETERIZATION (CUSTOM PROCEDURE TRAY) ×1 IMPLANT
SET ATX-X65L (MISCELLANEOUS) IMPLANT
SHEATH PROBE COVER 6X72 (BAG) IMPLANT

## 2023-10-12 NOTE — Interval H&P Note (Signed)
 History and Physical Interval Note:  10/12/2023 1:15 PM  Beverly [name]  has presented today for surgery, with the diagnosis of chest pain.  The various methods of treatment have been discussed with the patient and family. After consideration of risks, benefits and other options for treatment, the patient has consented to  Procedure(s): LEFT HEART CATH AND CORONARY ANGIOGRAPHY (N/A) as a surgical intervention.  The patient's history has been reviewed, patient examined, no change in status, stable for surgery.  I have reviewed the patient's chart and labs.  Questions were answered to the patient's satisfaction.   Cath Lab Visit (complete for each Cath Lab visit)  Clinical Evaluation Leading to the Procedure:   ACS: Yes.    Non-ACS:    Anginal Classification: CCS III  Anti-ischemic medical therapy: Minimal Therapy (1 class of medications)  Non-Invasive Test Results: No non-invasive testing performed  Prior CABG: No previous CABG        [name] 10/12/2023 1:15 PM

## 2023-10-13 ENCOUNTER — Encounter (HOSPITAL_COMMUNITY): Payer: Self-pay | Admitting: Cardiology

## 2023-10-19 ENCOUNTER — Other Ambulatory Visit (HOSPITAL_COMMUNITY): Payer: Self-pay

## 2023-10-20 ENCOUNTER — Other Ambulatory Visit: Payer: Self-pay | Admitting: Internal Medicine

## 2023-10-20 ENCOUNTER — Telehealth: Payer: Self-pay | Admitting: Internal Medicine

## 2023-10-20 DIAGNOSIS — Z0000 Encounter for general adult medical examination without abnormal findings: Secondary | ICD-10-CM

## 2023-10-20 NOTE — Telephone Encounter (Signed)
 Patient came in and requested a prescription for the COVID vaccine.  Please advise at [phone]

## 2023-10-20 NOTE — Telephone Encounter (Signed)
 Attempted to call the patient but she has a Geophysicist/field seismologist which has not been set up yet.

## 2023-10-21 ENCOUNTER — Telehealth (HOSPITAL_BASED_OUTPATIENT_CLINIC_OR_DEPARTMENT_OTHER): Payer: Self-pay

## 2023-10-21 ENCOUNTER — Encounter: Payer: Self-pay | Admitting: Internal Medicine

## 2023-10-21 MED ORDER — COVID-19 MRNA 2023-2024 VACCINE (COMIRNATY) 0.3 ML INJECTION: 0.3000 mL | Freq: Once | INTRAMUSCULAR | 0 refills | Status: AC

## 2023-10-21 NOTE — Telephone Encounter (Signed)
 I have prescribed the covid vaccine. Please print and have patient pick it up as I am not able to e-prescribe.

## 2023-10-21 NOTE — Telephone Encounter (Unsigned)
 Copied from CRM #[id number]. Topic: Clinical - Prescription Issue >> Oct 20, 2023 11:52 AM Leila BROCKS wrote: Reason for CRM: Patient [phone] states [facility] needs a prescription for a Covid vaccine, can this is be done as soon as possible due to patient's condition. Please advise and send that this was done through Mychart.   [id number] - [place] [place] - [address] AT [facility] OF [facility] [address] [place] [address] Phone:[phone]  ----------------------------------------------------------------------- From previous Reason for Contact - Lab/Test Order Request: Reason for CRM:

## 2023-10-21 NOTE — Telephone Encounter (Signed)
 Responded via Mychart 10/21/23.

## 2023-10-21 NOTE — Telephone Encounter (Signed)
 2nd attempt  Attempted to call the patient but she has a voicemail box which has not been set up yet.

## 2023-10-22 ENCOUNTER — Other Ambulatory Visit: Payer: Self-pay | Admitting: Internal Medicine

## 2023-10-22 ENCOUNTER — Encounter: Payer: Self-pay | Admitting: Internal Medicine

## 2023-10-26 ENCOUNTER — Ambulatory Visit: Admitting: Cardiology

## 2023-11-01 ENCOUNTER — Other Ambulatory Visit: Payer: Self-pay | Admitting: Allergy and Immunology

## 2023-11-01 ENCOUNTER — Other Ambulatory Visit: Payer: Self-pay

## 2023-11-01 ENCOUNTER — Other Ambulatory Visit: Payer: Self-pay | Admitting: Internal Medicine

## 2023-11-01 DIAGNOSIS — E782 Mixed hyperlipidemia: Secondary | ICD-10-CM

## 2023-11-02 ENCOUNTER — Other Ambulatory Visit (HOSPITAL_COMMUNITY): Payer: Self-pay

## 2023-11-02 ENCOUNTER — Other Ambulatory Visit: Payer: Self-pay

## 2023-11-02 MED ORDER — ATORVASTATIN CALCIUM 40 MG PO TABS
40.0000 mg | ORAL_TABLET | Freq: Every day | ORAL | 0 refills | Status: DC
  Filled 2023-11-02: qty 90, 90d supply, fill #0

## 2023-11-02 MED ORDER — LORATADINE 10 MG PO TABS
10.0000 mg | ORAL_TABLET | Freq: Every day | ORAL | 1 refills | Status: DC | PRN
  Filled 2023-11-02: qty 180, 90d supply, fill #0

## 2023-11-03 ENCOUNTER — Other Ambulatory Visit: Payer: Self-pay

## 2023-11-03 NOTE — Progress Notes (Signed)
 Cardiology Office Note    Date:  11/05/2023  ID:  Beverly, [name] January 03, 1946, MRN [id number] PCP:  Theophilus Andrews, Tully GRADE, MD  Cardiologist:  Maude Emmer, MD  Electrophysiologist:  None   Chief Complaint: Follow up for CAD   History of Present Illness: .    Beverly [name] is a 78 y.o. female with visit-pertinent history of coronary artery disease with DES to the LAD and ramus in 05/2021, SVT/nocturnal bradycardia, hyperlipidemia, OSA, ILD with severe tracheobronchomalacia.   Coronary CTA in 2023 was suggestive of tight LAD and ramus.  Patient went cardiac catheterization/3/23 with DES to LAD and DES to ramus.  Recommended for Brilinta  and aspirin  for at least 6 months.  Patient was admitted with acute GI bleed in 10/2021 and Brilinta  was held, she was switched to Plavix .  Patient was seen by Dr. Lavona on 10/08/2023 for chest pain.  It was noted that patient did not truly have chest pain prior to her previous stenting.  As noted she lives alone and was doing some mild yard work and was taking care of her own activities.  Patient developed midsternal chest discomfort that was not like her previous reflux and was a 6 out of 10 in intensity.  Patient reported some associated diaphoresis at rest.  Patient had recurrent episode in which she had jaw discomfort as well, with some improvement after 1-2 nitroglycerin  and then resolution with the second.  Patient was set up for cardiac catheterization.  Cardiac catheterization on 10/12/2023 indicated nonobstructive CAD with continued patency of LAD and ramus stents.  Patient with mid LAD to distal LAD lesion 30% stenosed, mid Cx lesion 15% stenosed, nonstenotic ostial LAD lesion was previously treated, nonstenotic proximal LAD to mid LAD lesion previously treated, LVEF 55 to 65% by visual estimate.  Today she presents for follow-up.  She reports that she has been doing very well overall.  She reports that she has not had any further chest  pain.  She reports that she does have a significant history of GERD, questions if her previous experience chest pain was related to this.  Patient reports that with increased dose of Imdur  she has noted significant dry mouth and difficulty coughing up secretions related to ILD.  She reports that her breathing is at baseline.  She denies any palpitations, lower extremity edema, orthopnea or PND.  Patient reports that she has overall return to her baseline.  ROS: .   Today she denies chest pain, shortness of breath, lower extremity edema, fatigue, palpitations, melena, hematuria, hemoptysis, diaphoresis, weakness, presyncope, syncope, orthopnea, and PND.  All other systems are reviewed and otherwise negative. Studies Reviewed: SABRA    EKG:  EKG is ordered today, personally reviewed, demonstrating  EKG Interpretation Date/Time:  Friday November 05 2023 11:13:28 EDT Ventricular Rate:  73 PR Interval:  226 QRS Duration:  90 QT Interval:  416 QTC Calculation: 458 R Axis:   75  Text Interpretation: Sinus rhythm with 1st degree A-V block Possible Anteroseptal infarct (cited on or before 20-May-2021) When compared with ECG of 08-Oct-2023 14:24, No significant change was found Confirmed by Maxie Slovacek [phone]) on 11/05/2023 8:54:59 PM   CV Studies: Cardiac studies reviewed are outlined and summarized above. Otherwise please see EMR for full report. Cardiac Studies & Procedures   ______________________________________________________________________________________________ CARDIAC CATHETERIZATION  CARDIAC CATHETERIZATION 10/12/2023  Conclusion   Mid LAD to Dist LAD lesion is 30% stenosed.   Mid Cx lesion is 15% stenosed.   Non-stenotic  Ost LAD lesion was previously treated.   Non-stenotic Prox LAD to Mid LAD lesion was previously treated.   Non-stenotic Ramus lesion was previously treated.   The left ventricular systolic function is normal.   LV end diastolic pressure is normal.   The left  ventricular ejection fraction is 55-65% by visual estimate.  Nonobstructive CAD- continued excellent patency of LAD and ramus stents Normal LV function Normal LVEDP  Plan: continue medical therapy  Findings Coronary Findings Diagnostic  Dominance: Right  Left Main Vessel is large. Vessel is angiographically normal.  Left Anterior Descending Vessel is moderate in size. Non-stenotic Ost LAD lesion was previously treated. The lesion is eccentric. The lesion is calcified. Non-stenotic Prox LAD to Mid LAD lesion was previously treated. Mid LAD to Dist LAD lesion is 30% stenosed.  First Diagonal Branch Vessel is small in size.  Second Diagonal Branch Vessel is small in size.  Ramus Intermedius Vessel is moderate in size. Non-stenotic Ramus lesion was previously treated.  Left Circumflex Vessel is large. Mid Cx lesion is 15% stenosed.  First Obtuse Marginal Branch Vessel is moderate in size.  Second Obtuse Marginal Branch Vessel is small in size.  Third Obtuse Marginal Branch Vessel is moderate in size.  Fourth Obtuse Marginal Branch Vessel is small in size.  Right Coronary Artery Vessel is moderate in size. The vessel exhibits minimal luminal irregularities.  Right Posterior Descending Artery Vessel is small in size.  Right Posterior Atrioventricular Artery Vessel is moderate in size.  First Right Posterolateral Branch Vessel is small in size.  Second Right Posterolateral Branch Vessel is small in size.  Third Right Posterolateral Branch Vessel is small in size.  Intervention  No interventions have been documented.   CARDIAC CATHETERIZATION  CARDIAC CATHETERIZATION 05/19/2021  Conclusion Conclusions: Severe two-vessel coronary artery disease with up to 90% stenoses involving the ostial through mid LAD and ostial through proximal ramus intermedius.  Mild, nonobstructive CAD noted in the LCx and RCA. Normal left ventricular systolic function (LVEF  55-65%) with normal filling pressure (LVEDP 10 mmHg) Successful IVUS-guided PCI to ostial through mid LAD using Onyx Frontier 2.75 x 38 mm drug-eluting stent with 0% residual stenosis and TIMI-3 flow Successful PCI to ostial through proximal ramus intermedius using Onyx Frontier 2.0 x 22 mm drug-eluting stent with 0% residual stenosis and TIMI-3 flow.  Recommendations: Overnight extended recovery. Dual antiplatelet therapy with aspirin  and clopidogrel , ideally for 6 months.  Nonemergent surgery will likely need to be delayed to allow for adequate antiplatelet therapy following two-vessel PCI. Aggressive secondary prevention.  [name], MD [facility]  Findings Coronary Findings Diagnostic  Dominance: Right  Left Main Vessel is large. Vessel is angiographically normal.  Left Anterior Descending Vessel is moderate in size. Ost LAD lesion is 50% stenosed. The lesion is eccentric. The lesion is calcified. Prox LAD to Mid LAD lesion is 90% stenosed. Ultrasound (IVUS) was performed. Severe plaque burden was detected. IVUS has determined that the lesion is heterogeneous. Mid LAD to Dist LAD lesion is 30% stenosed.  First Diagonal Branch Vessel is small in size.  Second Diagonal Branch Vessel is small in size.  Ramus Intermedius Vessel is moderate in size. Ramus lesion is 90% stenosed.  Left Circumflex Vessel is large. Mid Cx lesion is 15% stenosed.  First Obtuse Marginal Branch Vessel is moderate in size.  Second Obtuse Marginal Branch Vessel is small in size.  Third Obtuse Marginal Branch Vessel is moderate in size.  Fourth Obtuse Marginal Branch Vessel is  small in size.  Right Coronary Artery Vessel is moderate in size. The vessel exhibits minimal luminal irregularities.  Right Posterior Descending Artery Vessel is small in size.  Right Posterior Atrioventricular Artery Vessel is moderate in size.  First Right Posterolateral Branch Vessel is small in  size.  Second Right Posterolateral Branch Vessel is small in size.  Third Right Posterolateral Branch Vessel is small in size.  Intervention  Ost LAD lesion Stent (Also treats lesions: Prox LAD to Mid LAD) Lesion length:  36 mm. CATH VISTA GUIDE 6FR XBLAD3.5 guide catheter was inserted. Lesion crossed with guidewire using a WIRE RUNTHROUGH .[id number]. Pre-stent angioplasty was performed using a BALLN SAPPHIRE 2.5X15. Maximum pressure:  6 atm. A drug-eluting stent was successfully placed using a STENT ONYX FRONTIER [id number]. Maximum pressure: 16 atm. Stent strut is well apposed. Post-stent angioplasty was performed using a BALL SAPPHIRE NC24 3.0X22. Maximum pressure:  18 atm. Proximal edge of stent was further postdilated with an [place] Sapphire 3.25 x 8 mm balloon at 18 atm. Post-Intervention Lesion Assessment The intervention was successful. Pre-interventional TIMI flow is 3. Post-intervention TIMI flow is 3. No complications occurred at this lesion. There is a 0% residual stenosis post intervention.  Prox LAD to Mid LAD lesion Stent (Also treats lesions: Ost LAD) See details in Ost LAD lesion. Post-Intervention Lesion Assessment The intervention was successful. Pre-interventional TIMI flow is 3. Post-intervention TIMI flow is 3. No complications occurred at this lesion. There is a 0% residual stenosis post intervention.  Ramus lesion Stent Lesion length:  18 mm. CATH VISTA GUIDE 6FR XBLAD3.5 guide catheter was inserted. Lesion crossed with guidewire using a WIRE HI TORQ BMW 190CM. Pre-stent angioplasty was performed using a BALLN SAPPHIRE 2.0X12. Maximum pressure:  6 atm. A drug-elutingstent was successfully placed using a STENT ONYX FRONTIER 2.0X22. Maximum pressure: 16 atm. Stent strut is well apposed. Post-stent angioplasty was performed using a BALLN SAPPHIRE [place] 2.25X15. Maximum pressure:  16 atm. Post-Intervention Lesion Assessment The intervention was successful. Pre-interventional  TIMI flow is 3. Post-intervention TIMI flow is 3. No complications occurred at this lesion. There is a 0% residual stenosis post intervention.          CT SCANS  CT CORONARY FRACTIONAL FLOW RESERVE DATA PREP 05/16/2021  Narrative EXAM: CT FFR ANALYSIS  CLINICAL DATA:  abnormal coronary CT  FINDINGS: FFRct analysis was performed on the original cardiac CT angiogram dataset. Diagrammatic representation of the FFRct analysis is provided in a separate PDF document in PACS. This dictation was created using the PDF document and an interactive 3D model of the results. 3D model is not available in the EMR/PACS. Normal FFR range is >0.80. Indeterminate (grey) zone is 0.76-0.80.  1. Left Main: FFR = 0.99  2. LAD: Proximal FFR = 0.82, mid FFR = 0.70, distal FFR = 0.66 3. Ramus intermedius: Proximal FFR = 0.68, mid FFR = 0.68 4. LCX: Proximal FFR = 0.97, distal FFR = 0.86 5. RCA: Proximal FFR = 0.93, mid FFR =0.85, Distal FFR = 0.80  IMPRESSION: 1. CT FFR analysis showed significant stenosis in the mid LAD and proximal ramus intermedius.  RECOMMENDATIONS: Consider cardiac catheterization.  Guideline-directed medical therapy and aggressive risk factor modification for secondary prevention of coronary artery disease.   Electronically Signed By: Soyla Merck M.D. On: 05/16/2021 17:09   CT SCANS  CT CORONARY MORPH W/CTA COR W/SCORE 05/16/2021  Addendum 05/16/2021  5:05 PM ADDENDUM REPORT: 05/16/2021 17:03  HISTORY: Pre-op noncardiac surgery, moderate risk  EXAM: Cardiac/Coronary  CT  TECHNIQUE: The patient was scanned on a [employer].  PROTOCOL: A 120 kV prospective scan was triggered in the descending thoracic aorta at 111 HU's. Axial non-contrast 3 mm slices were carried out through the heart. The data set was analyzed on a dedicated work station and scored using the Agatston method. Gantry rotation speed was 250 msecs and collimation was .6 mm.  Beta blockade and 0.8 mg of sl NTG was given. The 3D data set was reconstructed in 5% intervals of the 35-75 % of the R-R cycle. Systolic and diastolic phases were analyzed on a dedicated work station using MPR, MIP and VRT modes. The patient received 95mL OMNIPAQUE  IOHEXOL  350 MG/ML SOLN contrast.  FINDINGS: Image quality: Good  Noise artifact is: Moderate misregistration due to respiratory motion artifact.  Coronary calcium  score is 31, which places the patient in the 49th percentile for age and sex matched control.  Coronary arteries: Normal coronary origins.  Right dominance.  Right Coronary Artery: Mild atherosclerotic plaque in the proximal RCA, 25-49% stenosis. Moderate mixed atherosclerotic plaque in the mid RCA, 50-69% stenosis.  Left Main Coronary Artery: No detectable plaque or stenosis.  Left Anterior Descending Coronary Artery: Severe, tubular mixed atherosclerotic plaque in the proximal LAD tortuous segment, 70-99% stenosis. Long segment tubular stenosis extends to the mid LAD and appears at least moderate, 50-69% stenosis.  Ramus intermedius: heavily calcified proximal RI with probable severe stenosis 70-99%.  Left Circumflex Artery: Moderate atherosclerotic plaque in the distal L circumflex, 50-69% stenosis.  Aorta: Normal size, 29 mm at the mid ascending aorta (level of the PA bifurcation) measured double oblique. No calcifications. No dissection.  Aortic Valve: No calcifications.  Other findings:  Normal pulmonary vein drainage into the left atrium.  Normal left atrial appendage without thrombus.  Normal size of the pulmonary artery.  IMPRESSION: 1. Severe CAD in proximal LAD and proximal ramus intermedius, CADRADS = 4. CT FFR will be performed and reported separately.  2. Coronary calcium  score is 31, which places the patient in the 49th percentile for age and sex matched control.  3. Normal coronary origins with right  dominance.   Electronically Signed By: Soyla Merck M.D. On: 05/16/2021 17:03  Narrative EXAM: OVER-READ INTERPRETATION  CT CHEST  The following report is an over-read performed by radiologist Dr. Reyes Holder of [facility] on 05/16/2021. This over-read does not include interpretation of cardiac or coronary anatomy or pathology. The coronary calcium  score/coronary CTA interpretation by the cardiologist is attached.  COMPARISON:  Jun 20, 2019.  FINDINGS: Vascular: No significant noncardiac vascular findings.  Mediastinum/Nodes: No mediastinal or hilar adenopathy in the acquired field-of-view. Visualized portions of the esophagus are unremarkable.  Lungs/Pleura: No suspicious pulmonary nodules or masses and no pleural effusion or pneumothorax in the acquired field-of-view.  Upper Abdomen: No acute abnormality.  Musculoskeletal: Multilevel degenerative changes spine. No acute osseous abnormality.  IMPRESSION: No acute or new significant noncardiac finding.  Electronically Signed: By: Reyes Holder M.D. On: 05/16/2021 12:44     ______________________________________________________________________________________________       Current Reported Medications:.    Current Meds  Medication Sig   acetaminophen  (TYLENOL ) 650 MG CR tablet Take 650-1,300 mg by mouth every 8 (eight) hours as needed for pain.   albuterol  (VENTOLIN  HFA) 108 (90 Base) MCG/ACT inhaler Inhale 2 puffs into the lungs every 4 (four) hours as needed for wheezing or shortness of breath.   ARTIFICIAL TEAR OP Place 2 drops into both eyes in  the morning and at bedtime.   atorvastatin  (LIPITOR) 40 MG tablet Take 1 tablet (40 mg total) by mouth daily.   Buprenorphine  HCl (BELBUCA ) 750 MCG FILM Place 750 mcg (1 film) inside cheek 2 (two) times daily.   Calcium  Carbonate-Vitamin D  600-200 MG-UNIT TABS Take 1 tablet by mouth 2 (two) times daily.   Cholecalciferol  (VITAMIN D -3 PO) Take 1  tablet by mouth daily.   clopidogrel  (PLAVIX ) 75 MG tablet Take 1 tablet (75 mg total) by mouth daily.   diclofenac  Sodium (VOLTAREN ) 1 % GEL Apply 2-4 g topically 4 (four) times daily as needed.   dicyclomine  (BENTYL ) 10 MG capsule Take 1 capsule (10 mg total) by mouth 2 (two) times daily as needed for spasms.   EPINEPHrine  0.3 mg/0.3 mL IJ SOAJ injection Inject 0.3 mg into the muscle as needed for anaphylaxis.   fluorouracil  (EFUDEX ) 5 % cream Apply two times a day to areas on hands for 2 weeks.   guaiFENesin  (MUCINEX ) 600 MG 12 hr tablet Take 600 mg by mouth 2 (two) times daily.   HYDROcodone -acetaminophen  (NORCO) 7.5-325 MG tablet Take 1 tablet by mouth 4 (four) times daily as needed for pain   ipratropium (ATROVENT ) 0.06 % nasal spray Place 2 sprays into both nostrils 4 (four) times daily as needed for rhinitis.   Lactobacillus-Inulin (CULTURELLE ADULT ULT BALANCE) CAPS Take 1 capsule by mouth daily.   levothyroxine  (SYNTHROID ) 25 MCG tablet Take 1 tablet (25 mcg total) by mouth daily.   loratadine  (CLARITIN ) 10 MG tablet Take 1 tablet (10 mg total) by mouth daily as needed for allergies (Can take an extra dose during flare ups.).   melatonin 5 MG TABS Take 5 mg by mouth at bedtime.   methocarbamol  (ROBAXIN ) 750 MG tablet Take 1 tablet (750 mg total) by mouth 3 (three) times daily as needed.   montelukast  (SINGULAIR ) 10 MG tablet Take 1 tablet (10 mg total) by mouth at bedtime.   Multiple Vitamin (MULTIVITAMIN ADULT PO) Take 1 tablet by mouth daily.   nitroGLYCERIN  (NITROSTAT ) 0.4 MG SL tablet Place 1 tablet (0.4 mg total) under the tongue every 5 (five) minutes as needed.   omega-3 acid ethyl esters (LOVAZA ) 1 g capsule Take 2 capsules (2 g total) by mouth 2 (two) times daily.   pantoprazole  (PROTONIX ) 40 MG tablet Take 1 tablet (40 mg total) by mouth 2 (two) times daily.   pneumococcal 21-valent conjugate vaccine (CAPVAXIVE ) 0.5 ML injection Inject into the muscle.   polyethylene glycol  (MIRALAX  / GLYCOLAX ) 17 g packet Take 17 g by mouth daily.   pregabalin  (LYRICA ) 50 MG capsule Take 1 capsule (50 mg total) by mouth every 8 (eight) hours. STOP gabapentin .   psyllium (METAMUCIL) 58.6 % packet Take 1 packet by mouth daily.   TART CHERRY PO Take 1 capsule by mouth daily.   Turmeric (QC TUMERIC COMPLEX PO) Take 1 capsule by mouth 2 (two) times daily.   valACYclovir  (VALTREX ) 500 MG tablet Take 1 tablet (500 mg total) by mouth daily.   venlafaxine  XR (EFFEXOR  XR) 75 MG 24 hr capsule Take 1 capsule (75 mg total) by mouth daily.   XEMBIFY  10 GM/50ML SOLN Inject 10 g into the skin once a week. Wednesday   [DISCONTINUED] aspirin  EC 81 MG tablet Take 1 tablet (81 mg total) by mouth daily. Swallow whole.   [DISCONTINUED] isosorbide  mononitrate (IMDUR ) 60 MG 24 hr tablet Take 1 tablet (60 mg total) by mouth daily.   Physical Exam:  VS:  BP 124/70   Pulse 73   Ht 5' 4 (1.626 m)   Wt 151 lb 9.6 oz (68.8 kg)   SpO2 96%   BMI 26.02 kg/m    Wt Readings from Last 3 Encounters:  11/05/23 151 lb 9.6 oz (68.8 kg)  10/12/23 150 lb (68 kg)  10/08/23 155 lb (70.3 kg)    GEN: Well nourished, well developed in no acute distress NECK: No JVD; No carotid bruits CARDIAC: RRR, no murmurs, rubs, gallops RESPIRATORY:  Clear to auscultation without rales, wheezing or rhonchi  ABDOMEN: Soft, non-tender, non-distended EXTREMITIES:  No edema; No acute deformity     Asessement and Plan:.    CAD: Patient with prior DES to LAD and DES to ramus in 2023.  Patient presented when 09/2023 with nitro and responsive chest pain.  Underwent cardiac catheterization with Dr. [place] on 10/12/2023 that showed nonobstructive CAD and continued excellent patency of LAD and ramus stents. Today she reports denies any further chest pain.  Reports that her breathing is at her baseline.  Right radial cath site is clean and intact without evidence of hematoma.  Patient notes significant dry mouth with increased dose of  Imdur  and difficulty coughing up secretions.  Will reduce her Imdur  back to 30 mg daily.  She will notify the office of recurrent chest discomfort.  Reviewed ED precautions.  Continue Lipitor 40 mg daily, Plavix  75 mg daily, Imdur  30 mg daily, Lovaza  2 g twice daily.  Dyslipidemia: Last lipid profile on 01/26/2023 indicated LDL 67.  Continue Lovaza  and atorvastatin .  SVT/nocturnal bradycardia: Patient denies any palpitations or feeling of increased heart rates.  ILD: Followed by pulmonology.  OSA: Patient reports history of OSA, uses a mouthpiece, tolerates well.   Disposition: F/u with Rosaline Pavy, NP in December as scheduled.   Signed, Sarahann Horrell D Estoria Geary, NP

## 2023-11-04 DIAGNOSIS — C44719 Basal cell carcinoma of skin of left lower limb, including hip: Secondary | ICD-10-CM | POA: Diagnosis not present

## 2023-11-05 ENCOUNTER — Encounter: Payer: Self-pay | Admitting: Cardiology

## 2023-11-05 ENCOUNTER — Ambulatory Visit: Attending: Cardiology | Admitting: Cardiology

## 2023-11-05 ENCOUNTER — Other Ambulatory Visit (HOSPITAL_COMMUNITY): Payer: Self-pay

## 2023-11-05 VITALS — BP 124/70 | HR 73 | Ht 64.0 in | Wt 151.6 lb

## 2023-11-05 DIAGNOSIS — I2510 Atherosclerotic heart disease of native coronary artery without angina pectoris: Secondary | ICD-10-CM | POA: Diagnosis not present

## 2023-11-05 DIAGNOSIS — I471 Supraventricular tachycardia, unspecified: Secondary | ICD-10-CM | POA: Diagnosis not present

## 2023-11-05 DIAGNOSIS — G4733 Obstructive sleep apnea (adult) (pediatric): Secondary | ICD-10-CM | POA: Diagnosis not present

## 2023-11-05 DIAGNOSIS — E785 Hyperlipidemia, unspecified: Secondary | ICD-10-CM

## 2023-11-05 MED ORDER — ISOSORBIDE MONONITRATE ER 30 MG PO TB24
30.0000 mg | ORAL_TABLET | Freq: Every day | ORAL | 3 refills | Status: AC
  Filled 2023-11-05 – 2023-11-13 (×4): qty 90, 90d supply, fill #0
  Filled 2024-02-12: qty 90, 90d supply, fill #1

## 2023-11-05 NOTE — Patient Instructions (Addendum)
 Medication Instructions:    START TAKING: IMDUR  30 MG ONCE A DAY    STOP TAKING AND REMOVE THIS MEDICATION FROM YOUR MEDICATION LIST:  ASPIRIN      *If you need a refill on your cardiac medications before your next appointment, please call your pharmacy*  Lab Work:  NONE ORDERED  TODAY    If you have labs (blood work) drawn today and your tests are completely normal, you will receive your results only by: MyChart Message (if you have MyChart) OR A paper copy in the mail If you have any lab test that is abnormal or we need to change your treatment, we will call you to review the results.  Testing/Procedures: NONE ORDERED  TODAY   Follow-Up: At [facility], you and your health needs are our priority.  As part of our continuing mission to provide you with exceptional heart care, our providers are all part of one team.  This team includes your primary Cardiologist (physician) and Advanced Practice Providers or APPs (Physician Assistants and Nurse Practitioners) who all work together to provide you with the care you need, when you need it.  Your next appointment:  AS ALREADY SCHEDULED     We recommend signing up for the patient portal called MyChart.  Sign up information is provided on this After Visit Summary.  MyChart is used to connect with patients for Virtual Visits (Telemedicine).  Patients are able to view lab/test results, encounter notes, upcoming appointments, etc.  Non-urgent messages can be sent to your provider as well.   To learn more about what you can do with MyChart, go to [email or web address].   Other Instructions

## 2023-11-06 ENCOUNTER — Other Ambulatory Visit (HOSPITAL_COMMUNITY): Payer: Self-pay

## 2023-11-06 ENCOUNTER — Encounter: Payer: Self-pay | Admitting: Cardiology

## 2023-11-09 ENCOUNTER — Ambulatory Visit: Admitting: Allergy and Immunology

## 2023-11-09 ENCOUNTER — Other Ambulatory Visit: Payer: Self-pay

## 2023-11-09 ENCOUNTER — Encounter: Payer: Self-pay | Admitting: Allergy and Immunology

## 2023-11-09 ENCOUNTER — Other Ambulatory Visit (HOSPITAL_COMMUNITY): Payer: Self-pay

## 2023-11-09 VITALS — BP 118/64 | HR 69 | Temp 97.8°F | Resp 18 | Ht 64.5 in | Wt 152.5 lb

## 2023-11-09 DIAGNOSIS — D839 Common variable immunodeficiency, unspecified: Secondary | ICD-10-CM | POA: Diagnosis not present

## 2023-11-09 DIAGNOSIS — J479 Bronchiectasis, uncomplicated: Secondary | ICD-10-CM | POA: Diagnosis not present

## 2023-11-09 DIAGNOSIS — K219 Gastro-esophageal reflux disease without esophagitis: Secondary | ICD-10-CM | POA: Diagnosis not present

## 2023-11-09 DIAGNOSIS — J4540 Moderate persistent asthma, uncomplicated: Secondary | ICD-10-CM | POA: Diagnosis not present

## 2023-11-09 DIAGNOSIS — R7989 Other specified abnormal findings of blood chemistry: Secondary | ICD-10-CM

## 2023-11-09 DIAGNOSIS — K52832 Lymphocytic colitis: Secondary | ICD-10-CM | POA: Diagnosis not present

## 2023-11-09 DIAGNOSIS — J310 Chronic rhinitis: Secondary | ICD-10-CM | POA: Diagnosis not present

## 2023-11-09 MED ORDER — IPRATROPIUM BROMIDE 0.06 % NA SOLN
2.0000 | Freq: Four times a day (QID) | NASAL | 1 refills | Status: AC | PRN
  Filled 2023-11-09: qty 45, 57d supply, fill #0

## 2023-11-09 MED ORDER — PANTOPRAZOLE SODIUM 40 MG PO TBEC
40.0000 mg | DELAYED_RELEASE_TABLET | Freq: Two times a day (BID) | ORAL | 1 refills | Status: AC
  Filled 2023-11-09 – 2023-11-25 (×2): qty 180, 90d supply, fill #0
  Filled 2024-02-23: qty 180, 90d supply, fill #1

## 2023-11-09 MED ORDER — ALBUTEROL SULFATE HFA 108 (90 BASE) MCG/ACT IN AERS
2.0000 | INHALATION_SPRAY | RESPIRATORY_TRACT | 1 refills | Status: AC | PRN
Start: 2023-11-09 — End: ?
  Filled 2023-11-09: qty 6.7, 17d supply, fill #0

## 2023-11-09 MED ORDER — LORATADINE 10 MG PO TABS
10.0000 mg | ORAL_TABLET | Freq: Every day | ORAL | 1 refills | Status: AC | PRN
  Filled 2023-11-09: qty 180, 180d supply, fill #0

## 2023-11-09 MED ORDER — FAMOTIDINE 40 MG PO TABS
40.0000 mg | ORAL_TABLET | Freq: Every evening | ORAL | 1 refills | Status: AC
Start: 2023-11-09 — End: ?
  Filled 2023-11-09: qty 90, 90d supply, fill #0
  Filled 2024-02-03: qty 90, 90d supply, fill #1

## 2023-11-09 MED ORDER — MONTELUKAST SODIUM 10 MG PO TABS
10.0000 mg | ORAL_TABLET | Freq: Every evening | ORAL | 1 refills | Status: AC
  Filled 2023-11-09 – 2024-01-27 (×2): qty 90, 90d supply, fill #0

## 2023-11-09 NOTE — Progress Notes (Unsigned)
 [facility] [place] [facility] [place]   Follow-up Note  Referring Provider: Theophilus Andrews, Jonna* Primary Provider: Theophilus Andrews, Tully GRADE, MD Date of Office Visit: 11/09/2023  Subjective:   Beverly [name] (DOB: 02-Nov-1945) is a 78 y.o. female who [facility] in re-evaluation of the following:  HPI: Beverly [name] returns to this clinic in evaluation of CVID, history of Nocardia pneumonia and bronchiectasis, history of asthma, history of reflux, history of lymphocytic colitis.  I last saw her in this clinic 11 May 2023.  She has done very well some since her last visit regarding infectious diseases.  She has only had 1 sinus infection since I seen her in this clinic on 26 September 2023 at which point in time we gave her Augmentin  which was successful in alleviating this issue.  Otherwise, she has not had any infections involving her respiratory tract, GI tract, skin or other organs.  She continues on immunoglobulin infusions.  Her lymphocytic colitis is handled very well with the use of oral budesonide  administered 3 times per week as directed by GI.  She has had very little issues with either her upper or lower airway while using montelukast  on a consistent basis and a Claritin  and occasionally nasal ipratropium.  She has had no need to use any albuterol .  She was having more problems with reflux during her last visit and we increased her pantoprazole  to twice a day and she is no longer using any famotidine .  The issue that she has with reflux is regurgitation.  There is not really any burning or bad taste it is just that she has regurgitation.  She still drinks caffeinated drink every morning and has chocolate about twice a week and alcohol about twice a week.  She contacted me by telephone noting that she was very fatigued and we had her obtain some blood test in April which identified a low a.m. cortisol at 0.9 UG/DL and  we refer her onto endocrinology and she has that appointment in a few weeks.  Allergies as of 11/09/2023       Reactions   Other Other (See Comments)   Adhesive [tape] Rash   Blister    Codeine Rash        Medication List    acetaminophen  650 MG CR tablet Commonly known as: TYLENOL  Take 650-1,300 mg by mouth every 8 (eight) hours as needed for pain.   albuterol  108 (90 Base) MCG/ACT inhaler Commonly known as: Ventolin  HFA Inhale 2 puffs into the lungs every 4 (four) hours as needed for wheezing or shortness of breath.   ARTIFICIAL TEAR OP Place 2 drops into both eyes in the morning and at bedtime.   atorvastatin  40 MG tablet Commonly known as: LIPITOR Take 1 tablet (40 mg total) by mouth daily.   Belbuca  750 MCG Film Generic drug: Buprenorphine  HCl Place 750 mcg (1 film) inside cheek 2 (two) times daily.   Calcium  Carbonate-Vitamin D  600-200 MG-UNIT Tabs Take 1 tablet by mouth 2 (two) times daily.   Capvaxive  0.5 ML injection Generic drug: pneumococcal 21-valent conjugate vaccine Inject into the muscle.   clopidogrel  75 MG tablet Commonly known as: PLAVIX  Take 1 tablet (75 mg total) by mouth daily.   Culturelle Adult Ult Balance Caps Take 1 capsule by mouth daily.   diclofenac  Sodium 1 % Gel Commonly known as: VOLTAREN  Apply 2-4 g topically 4 (four) times daily as needed.   dicyclomine   10 MG capsule Commonly known as: BENTYL  Take 1 capsule (10 mg total) by mouth 2 (two) times daily as needed for spasms.   EPINEPHrine  0.3 mg/0.3 mL Soaj injection Commonly known as: EPI-PEN Inject 0.3 mg into the muscle as needed for anaphylaxis.   fluorouracil  5 % cream Commonly known as: Efudex  Apply two times a day to areas on hands for 2 weeks.   guaiFENesin  600 MG 12 hr tablet Commonly known as: MUCINEX  Take 600 mg by mouth 2 (two) times daily.   HYDROcodone -acetaminophen  7.5-325 MG tablet Commonly known as: NORCO Take 1 tablet by mouth 4 (four) times daily as  needed for pain   ipratropium 0.06 % nasal spray Commonly known as: ATROVENT  Place 2 sprays into both nostrils 4 (four) times daily as needed for rhinitis.   isosorbide  mononitrate 30 MG 24 hr tablet Commonly known as: IMDUR  Take 1 tablet (30 mg total) by mouth daily.   levothyroxine  25 MCG tablet Commonly known as: SYNTHROID  Take 1 tablet (25 mcg total) by mouth daily.   loratadine  10 MG tablet Commonly known as: Claritin  Take 1 tablet (10 mg total) by mouth daily as needed for allergies (Can take an extra dose during flare ups.).   melatonin 5 MG Tabs Take 5 mg by mouth at bedtime.   methocarbamol  750 MG tablet Commonly known as: ROBAXIN  Take 1 tablet (750 mg total) by mouth 3 (three) times daily as needed.   montelukast  10 MG tablet Commonly known as: SINGULAIR  Take 1 tablet (10 mg total) by mouth at bedtime.   MULTIVITAMIN ADULT PO Take 1 tablet by mouth daily.   nitroGLYCERIN  0.4 MG SL tablet Commonly known as: Nitrostat  Place 1 tablet (0.4 mg total) under the tongue every 5 (five) minutes as needed.   omega-3 acid ethyl esters 1 g capsule Commonly known as: LOVAZA  Take 2 capsules (2 g total) by mouth 2 (two) times daily.   pantoprazole  40 MG tablet Commonly known as: PROTONIX  Take 1 tablet (40 mg total) by mouth 2 (two) times daily.   polyethylene glycol 17 g packet Commonly known as: MIRALAX  / GLYCOLAX  Take 17 g by mouth daily.   pregabalin  50 MG capsule Commonly known as: Lyrica  Take 1 capsule (50 mg total) by mouth every 8 (eight) hours. STOP gabapentin .   psyllium 58.6 % packet Commonly known as: METAMUCIL Take 1 packet by mouth daily.   QC TUMERIC COMPLEX PO Take 1 capsule by mouth 2 (two) times daily.   TART CHERRY PO Take 1 capsule by mouth daily.   valACYclovir  500 MG tablet Commonly known as: Valtrex  Take 1 tablet (500 mg total) by mouth daily.   venlafaxine  XR 75 MG 24 hr capsule Commonly known as: Effexor  XR Take 1 capsule (75 mg  total) by mouth daily.   VITAMIN D -3 PO Take 1 tablet by mouth daily.   Xembify  10 GM/50ML Soln Generic drug: Immune Globulin  (Human)-klhw Inject 10 g into the skin once a week. 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   right sholder   BCC (basal cell carcinoma of skin) 05/09/2014   mid lower back   BCC (basal cell carcinoma of skin)  05/03/2017   right low back   BCC (basal cell carcinoma of skin) 05/03/2017   left upper back   BCC (basal cell carcinoma of skin) 07/05/2018   left mid back   BCC (basal cell carcinoma of skin) 05/20/1992   upper back   BCC (basal cell carcinoma of skin) 07/29/1993   left sholder medial   BCC (basal cell carcinoma of skin) 07/29/1993   left sholder lateral   BCC (basal cell carcinoma of skin) 07/29/1993   right thigh   BCC (basal cell carcinoma of skin) 07/29/1993   right sholder   BCC (basal cell carcinoma of skin) 12/22/1994   right mid forearm   BCC (basal cell carcinoma of skin) 12/22/1994   right upper forearm   BCC (basal cell carcinoma of skin) 12/22/1994   lower right upper arm   BCC (basal cell carcinoma of skin) 12/22/1994   right upper arm sholder   BCC (basal cell carcinoma of skin) 08/11/1995   left leg below knee   BCC (basal cell carcinoma of skin) 04/11/2002   mid back   BCC (basal cell carcinoma of skin) 12/10/2002   right center upper back   BCC (basal cell carcinoma of skin) 05/26/2005   right post sholder   BCC (basal cell carcinoma) 05/09/2014   left inner shin   BCC (basal cell carcinoma) 06/12/2014   left forearm   Blood transfusion without reported diagnosis 11/06/21   Bowen's disease 10/07/1994   right post knee, right inner forearm/wrist    Bowen's disease 08/11/1995   left sholder   Bronchiectasis [facility])    Cataract 2010   left   Common variable immunodeficiency [facility])    sees dr tiana ronco 02-11-2021 epic   COPD (chronic obstructive pulmonary disease) (HCC) 2022   Brochiectosis / tracheobrocheomalacia   Depression 02/17/1995   hx of   Diverticulosis of colon 1998   mild   DJD (degenerative joint disease)    Duodenal ulcer    h/o yrs ago   Dysrhythmia    GERD (gastroesophageal reflux disease) 1989   Heart murmur 1949   History of SCC (squamous cell carcinoma) of skin    Dr. Livingston   History of sinus bradycardia    HLD (hyperlipidemia)    hypertriglyceridemia   Hypertensive retinopathy of both eyes 11/18/2017   Hypothyroidism    IBS (irritable bowel syndrome)    Internal hemorrhoids 1998   Lymphocytic colitis    Mitral regurgitation    mild   Myocardial infarction [facility]) 2023   Cardiac CT 2023 - 2 cardiac stents placed   Nocardiosis    relased by infection disease dec 2022   Osteoarthritis    feet,shoulder,neck,back,hips and hands.   Pneumonia 2015   PONV (postoperative nausea and vomiting)    Rotator cuff tear, right 02/2019   infraspinatus and supraspinatus, and dislocation of long head of bicep tendon (Dr. Ernie)   SCC (squamous cell carcinoma) 11/26/2014   left hand, right hand, right deltoid mnay areas   SCC (squamous cell carcinoma) 05/03/2017   left cheek   Sleep apnea 2017   uses a mouth guard nightly   Squamous cell carcinoma in situ (SCCIS) 07/05/2018   left hand   Tracheobronchomalacia    Trigger finger    left hand ring finger   Vitamin D  deficiency    mild    Past Surgical History:  Procedure Laterality Date   ABDOMINAL EXPOSURE N/A 02/02/2022   Procedure: ABDOMINAL EXPOSURE;  Surgeon: Gretta,  Lonni PARAS, MD;  Location: [facility] OR;  Service: Vascular;  Laterality: N/A;   ABDOMINAL HYSTERECTOMY  1997   complete   BIOPSY  11/05/2021   Procedure: BIOPSY;  Surgeon: Eda Iha, MD;   Location: [facility] ENDOSCOPY;  Service: Gastroenterology;;   BLEPHAROPLASTY Bilateral 01/2018   BROW LIFT Bilateral 10/10/2018   Procedure: DORY LIFT;  Surgeon: Lowery Estefana RAMAN, DO;  Location: [facility];  Service: Plastics;  Laterality: Bilateral;   BUNIONECTOMY     R 12/08, L 2004 (Dr. Teresita)   CARDIAC CATHETERIZATION     CARPAL TUNNEL RELEASE  1989   bilateral   CARPAL TUNNEL RELEASE Left 03/06/2021   Procedure: Left Revision Carpal Tunnel Release with hypothenar fat pad flap;  Surgeon: Alyse Agent, MD;  Location: [facility];  Service: Orthopedics;  Laterality: Left;  with local anesthesia   CATARACT EXTRACTION Bilateral Left in 08/2012, Right 04/2016   Dr.Hecker   CESAREAN SECTION     1981 and 1986   CHOLECYSTECTOMY  1992   laparoscopic   CLOSED REDUCTION NASAL FRACTURE N/A 08/29/2018   Procedure: CLOSED REDUCTION NASAL FRACTURE;  Surgeon: Lowery Estefana RAMAN, DO;  Location: [facility];  Service: Plastics;  Laterality: N/A;  1 hour, please   COLONOSCOPY  2006   COLONOSCOPY  01/2012   due again 01/2021; mild diverticulosis   CORONARY STENT INTERVENTION N/A 05/19/2021   Procedure: CORONARY STENT INTERVENTION;  Surgeon: Mady Lonni, MD;  Location: [facility] INVASIVE CV LAB;  Service: Cardiovascular;  Laterality: N/A;   CORONARY ULTRASOUND/IVUS N/A 05/19/2021   Procedure: Intravascular Ultrasound/IVUS;  Surgeon: Mady Lonni, MD;  Location: [facility];  Service: Cardiovascular;  Laterality: N/A;   COSMETIC SURGERY  2018   epidural steroid injection, back  02/2010   ESOPHAGOGASTRODUODENOSCOPY (EGD) WITH PROPOFOL  N/A 11/05/2021   Procedure: ESOPHAGOGASTRODUODENOSCOPY (EGD) WITH PROPOFOL ;  Surgeon: Eda Iha, MD;  Location: [facility] ENDOSCOPY;  Service: Gastroenterology;  Laterality: N/A;   EYE SURGERY  2017   HIP SURGERY     right bursectomy x 3   HIP SURGERY Right 2000   torn cartilage, repaired   INGUINAL HERNIA REPAIR   10/2007   bilat   JOINT REPLACEMENT  04/11/2019   Right shoulder   LEFT HEART CATH AND CORONARY ANGIOGRAPHY N/A 05/19/2021   Procedure: LEFT HEART CATH AND CORONARY ANGIOGRAPHY;  Surgeon: Mady Lonni, MD;  Location: [facility];  Service: Cardiovascular;  Laterality: N/A;   LEFT HEART CATH AND CORONARY ANGIOGRAPHY N/A 10/12/2023   Procedure: LEFT HEART CATH AND CORONARY ANGIOGRAPHY;  Surgeon: Swaziland, [name] M, MD;  Location: [facility] INVASIVE CV LAB;  Service: Cardiovascular;  Laterality: N/A;   LUMBAR FUSION  01/2022   NECK SURGERY  1989   c6-7 cervical laminectomy and diskecotmy   REVERSE SHOULDER ARTHROPLASTY Right 04/11/2019   Procedure: REVERSE SHOULDER ARTHROPLASTY;  Surgeon: Sharl Selinda Dover, MD;  Location: [facility] OR;  Service: Orthopedics;  Laterality: Right;  Regional Block   SHOULDER SURGERY Left 03/2003   left rotator cuff repair   SHOULDER SURGERY Left 03/14/2018   rotator cuff repair; Dr. Duwayne   SPINE SURGERY  1989   TONSILLECTOMY  age 39   TOTAL KNEE ARTHROPLASTY Right 03/12/2023   Procedure: TOTAL KNEE ARTHROPLASTY;  Surgeon: Kay Kemps, MD;  Location: [facility] ORS;  Service: Orthopedics;  Laterality: Right;   TROCHANTERIC BURSA EXCISION Right 1978   TUBAL LIGATION  1986i   UPPER GASTROINTESTINAL ENDOSCOPY     WISDOM TOOTH EXTRACTION  as teenager    Review of systems negative except as noted in HPI / PMHx or noted below:  Review of Systems  Constitutional: Negative.   HENT: Negative.    Eyes: Negative.   Respiratory: Negative.    Cardiovascular: Negative.   Gastrointestinal: Negative.   Genitourinary: Negative.   Musculoskeletal: Negative.   Skin: Negative.   Neurological: Negative.   Endo/Heme/Allergies: Negative.   Psychiatric/Behavioral: Negative.       Objective:   Vitals:   11/09/23 1328  BP: 118/64  Pulse: 69  Resp: 18  Temp: 97.8 F (36.6 C)  SpO2: 97%   Height: 5' 4.5 (163.8 cm)  Weight: 152 lb 8 oz (69.2 kg)   Physical  Exam Constitutional:      Appearance: She is not diaphoretic.  HENT:     Head: Normocephalic.     Right Ear: Tympanic membrane, ear canal and external ear normal.     Left Ear: Tympanic membrane, ear canal and external ear normal.     Nose: Nose normal. No mucosal edema or rhinorrhea.     Mouth/Throat:     Pharynx: Uvula midline. No oropharyngeal exudate.  Eyes:     Conjunctiva/sclera: Conjunctivae normal.  Neck:     Thyroid : No thyromegaly.     Trachea: Trachea normal. No tracheal tenderness or tracheal deviation.  Cardiovascular:     Rate and Rhythm: Normal rate and regular rhythm.     Heart sounds: Normal heart sounds, S1 normal and S2 normal. No murmur heard. Pulmonary:     Effort: No respiratory distress.     Breath sounds: Normal breath sounds. No stridor. No wheezing or rales.  Lymphadenopathy:     Head:     Right side of head: No tonsillar adenopathy.     Left side of head: No tonsillar adenopathy.     Cervical: No cervical adenopathy.  Skin:    Findings: No erythema or rash.     Nails: There is no clubbing.  Neurological:     Mental Status: She is alert.     Diagnostics: Spirometry was performed and demonstrated an FEV1 of 2.25 at 114 % of predicted.  Results of blood tests obtained 08 October 2023 identifies WBC 6.8, absolute eosinophil 300, absolute lymphocyte 1600, hemoglobin 14.4, platelet 175.  Results of blood tests obtained 10 June 2023 identifies a.m. cortisol 0.9 UG/DL, U58.97 NG/DL, thyroid  peroxidase antibody 24U/ML,  Assessment and Plan:   1. CVID (common variable immunodeficiency) (HCC)   2. Moderate persistent asthma without complication   3. Bronchiectasis without complication (HCC)   4. LPRD (laryngopharyngeal reflux disease)   5. Lymphocytic colitis   6. Nonallergic rhinitis   7. Low serum cortisol level    1.  Continue immunoglobulin infusions every week  2.  Continue to treat reflex / LPR:   A.  Pantoprazole  40 mg in AM  B.   Famotidine  40 mg in PM   C.  Replace throat clearing with swallowing/drinking maneuver  D.  Decrease caffeine, chocolate, alcohol consumption  3.  Continue to treat and prevent inflammation of upper airway:   A.  Montelukast  10 mg -1 tablet 1 time per day  4. If needed:  A. Loratadine  10 mg 1 tablet 1 time per day  B. Ipratropium 0.06% - 2 sprays each nostril every 6 hours (4x/day) C. Nasal saline D. Albuterol  HFA - 2 inhalations every 4-6 hours  5.  Continue oral budesonide  therapy for lymphocytic colitis  6.  Check blood - IgA/G/M  7.  Return to clinic in 6 months or earlier if problem  8. Influenza = Tamiflu . Covid = Paxlovid    Beverly [name] appears to be doing overall very well regarding her CVID and her respiratory tract issues and her reflux appears to be under pretty good control at this point as is her lymphocytic colitis on her current plan as noted above.  We will check her immunoglobulin levels today.  Assuming that they are in the appropriate range she will remain on her current dose of Xembify  10 g/week.  Will see her back in this clinic in 6 months or earlier if there is a problem.  She will be visiting with the endocrinologist regarding her low a.m. cortisol level in October 2025.  Camellia Denis, MD Allergy / Immunology [facility]

## 2023-11-09 NOTE — Patient Instructions (Addendum)
  1.  Continue immunoglobulin infusions every week  2.  Continue to treat reflex / LPR:   A.  Pantoprazole  40 mg in AM  B.  Famotidine  40 mg in PM   C.  Replace throat clearing with swallowing/drinking maneuver  D.  Decrease caffeine, chocolate, alcohol consumption  3.  Continue to treat and prevent inflammation of upper airway:   A.  Montelukast  10 mg -1 tablet 1 time per day  4. If needed:  A. Loratadine  10 mg 1 tablet 1 time per day  B. Ipratropium 0.06% - 2 sprays each nostril every 6 hours (4x/day) C. Nasal saline D. Albuterol  HFA - 2 inhalations every 4-6 hours  5.  Continue oral budesonide  therapy for lymphocytic colitis  6.  Check blood - IgA/G/M  7.  Return to clinic in 6 months or earlier if problem  8. Influenza = Tamiflu . Covid = Paxlovid

## 2023-11-10 ENCOUNTER — Encounter: Payer: Self-pay | Admitting: Allergy and Immunology

## 2023-11-10 LAB — IGG, IGA, IGM
IgA/Immunoglobulin A, Serum: 71 mg/dL (ref 64–422)
IgG (Immunoglobin G), Serum: 1195 mg/dL (ref 586–1602)
IgM (Immunoglobulin M), Srm: 35 mg/dL (ref 26–217)

## 2023-11-13 ENCOUNTER — Other Ambulatory Visit (HOSPITAL_COMMUNITY): Payer: Self-pay

## 2023-11-15 ENCOUNTER — Ambulatory Visit: Payer: Self-pay | Admitting: Allergy and Immunology

## 2023-11-17 ENCOUNTER — Other Ambulatory Visit (HOSPITAL_COMMUNITY): Payer: Self-pay

## 2023-11-24 ENCOUNTER — Encounter: Payer: Self-pay | Admitting: Internal Medicine

## 2023-11-25 ENCOUNTER — Other Ambulatory Visit: Payer: Self-pay

## 2023-11-25 ENCOUNTER — Other Ambulatory Visit (HOSPITAL_COMMUNITY): Payer: Self-pay

## 2023-11-25 DIAGNOSIS — M150 Primary generalized (osteo)arthritis: Secondary | ICD-10-CM | POA: Diagnosis not present

## 2023-11-25 DIAGNOSIS — G894 Chronic pain syndrome: Secondary | ICD-10-CM | POA: Diagnosis not present

## 2023-11-25 DIAGNOSIS — M47816 Spondylosis without myelopathy or radiculopathy, lumbar region: Secondary | ICD-10-CM | POA: Diagnosis not present

## 2023-11-25 DIAGNOSIS — Z79891 Long term (current) use of opiate analgesic: Secondary | ICD-10-CM | POA: Diagnosis not present

## 2023-11-25 MED ORDER — BELBUCA 750 MCG BU FILM
750.0000 ug | ORAL_FILM | Freq: Two times a day (BID) | BUCCAL | 2 refills | Status: AC
  Filled 2023-11-30: qty 60, 30d supply, fill #0
  Filled 2024-02-01: qty 60, 30d supply, fill #1

## 2023-11-25 MED ORDER — PREGABALIN 50 MG PO CAPS
50.0000 mg | ORAL_CAPSULE | Freq: Three times a day (TID) | ORAL | 2 refills | Status: AC
  Filled 2024-02-24: qty 270, 90d supply, fill #0

## 2023-11-25 MED ORDER — METHOCARBAMOL 750 MG PO TABS
750.0000 mg | ORAL_TABLET | Freq: Three times a day (TID) | ORAL | 2 refills | Status: AC | PRN
  Filled 2023-11-25: qty 270, 90d supply, fill #0
  Filled 2024-02-24: qty 270, 90d supply, fill #1

## 2023-11-26 NOTE — Telephone Encounter (Signed)
 Recommendations for new provider to replace you

## 2023-11-29 ENCOUNTER — Encounter: Payer: Self-pay | Admitting: Allergy and Immunology

## 2023-11-29 ENCOUNTER — Other Ambulatory Visit: Payer: Self-pay | Admitting: Internal Medicine

## 2023-11-29 ENCOUNTER — Other Ambulatory Visit (HOSPITAL_COMMUNITY): Payer: Self-pay

## 2023-11-29 DIAGNOSIS — R7989 Other specified abnormal findings of blood chemistry: Secondary | ICD-10-CM | POA: Diagnosis not present

## 2023-11-29 DIAGNOSIS — R5383 Other fatigue: Secondary | ICD-10-CM | POA: Diagnosis not present

## 2023-11-30 ENCOUNTER — Other Ambulatory Visit (HOSPITAL_COMMUNITY): Payer: Self-pay

## 2023-12-01 ENCOUNTER — Other Ambulatory Visit (HOSPITAL_COMMUNITY): Payer: Self-pay

## 2023-12-02 ENCOUNTER — Other Ambulatory Visit (HOSPITAL_COMMUNITY): Payer: Self-pay

## 2023-12-02 ENCOUNTER — Encounter: Payer: Self-pay | Admitting: Internal Medicine

## 2023-12-06 ENCOUNTER — Other Ambulatory Visit: Payer: Self-pay

## 2023-12-06 ENCOUNTER — Other Ambulatory Visit: Payer: Self-pay | Admitting: Internal Medicine

## 2023-12-06 ENCOUNTER — Other Ambulatory Visit (HOSPITAL_COMMUNITY): Payer: Self-pay

## 2023-12-06 DIAGNOSIS — R5383 Other fatigue: Secondary | ICD-10-CM | POA: Diagnosis not present

## 2023-12-06 DIAGNOSIS — R7989 Other specified abnormal findings of blood chemistry: Secondary | ICD-10-CM | POA: Diagnosis not present

## 2023-12-06 DIAGNOSIS — A6000 Herpesviral infection of urogenital system, unspecified: Secondary | ICD-10-CM

## 2023-12-06 MED ORDER — VALACYCLOVIR HCL 500 MG PO TABS
500.0000 mg | ORAL_TABLET | Freq: Every day | ORAL | 1 refills | Status: AC
  Filled 2023-12-06 – 2024-02-28 (×2): qty 90, 90d supply, fill #0

## 2023-12-07 ENCOUNTER — Ambulatory Visit: Admitting: "Endocrinology

## 2023-12-07 ENCOUNTER — Other Ambulatory Visit (HOSPITAL_COMMUNITY): Payer: Self-pay

## 2023-12-08 ENCOUNTER — Encounter: Payer: Self-pay | Admitting: Internal Medicine

## 2023-12-08 ENCOUNTER — Other Ambulatory Visit (HOSPITAL_COMMUNITY): Payer: Self-pay

## 2023-12-08 DIAGNOSIS — G510 Bell's palsy: Secondary | ICD-10-CM | POA: Diagnosis not present

## 2023-12-08 DIAGNOSIS — H524 Presbyopia: Secondary | ICD-10-CM | POA: Diagnosis not present

## 2023-12-08 DIAGNOSIS — H26493 Other secondary cataract, bilateral: Secondary | ICD-10-CM | POA: Diagnosis not present

## 2023-12-08 DIAGNOSIS — G43B0 Ophthalmoplegic migraine, not intractable: Secondary | ICD-10-CM | POA: Diagnosis not present

## 2023-12-08 DIAGNOSIS — H04123 Dry eye syndrome of bilateral lacrimal glands: Secondary | ICD-10-CM | POA: Diagnosis not present

## 2023-12-08 MED ORDER — LEVOCETIRIZINE DIHYDROCHLORIDE 5 MG PO TABS
5.0000 mg | ORAL_TABLET | Freq: Every evening | ORAL | 2 refills | Status: AC
  Filled 2023-12-08 (×2): qty 90, 90d supply, fill #0

## 2023-12-31 ENCOUNTER — Ambulatory Visit: Admitting: "Endocrinology

## 2024-01-03 ENCOUNTER — Other Ambulatory Visit: Payer: Self-pay

## 2024-01-04 ENCOUNTER — Other Ambulatory Visit: Payer: Self-pay

## 2024-01-17 ENCOUNTER — Encounter: Payer: Self-pay | Admitting: Gastroenterology

## 2024-01-17 ENCOUNTER — Other Ambulatory Visit: Payer: Self-pay

## 2024-01-17 ENCOUNTER — Other Ambulatory Visit: Payer: Self-pay | Admitting: Internal Medicine

## 2024-01-17 ENCOUNTER — Other Ambulatory Visit (HOSPITAL_COMMUNITY): Payer: Self-pay

## 2024-01-17 ENCOUNTER — Ambulatory Visit: Admitting: Gastroenterology

## 2024-01-17 VITALS — BP 110/58 | HR 72 | Ht 64.5 in | Wt 153.0 lb

## 2024-01-17 DIAGNOSIS — T402X5A Adverse effect of other opioids, initial encounter: Secondary | ICD-10-CM

## 2024-01-17 DIAGNOSIS — Z8719 Personal history of other diseases of the digestive system: Secondary | ICD-10-CM | POA: Diagnosis not present

## 2024-01-17 DIAGNOSIS — K5903 Drug induced constipation: Secondary | ICD-10-CM | POA: Diagnosis not present

## 2024-01-17 DIAGNOSIS — R194 Change in bowel habit: Secondary | ICD-10-CM

## 2024-01-17 DIAGNOSIS — E039 Hypothyroidism, unspecified: Secondary | ICD-10-CM

## 2024-01-17 DIAGNOSIS — K219 Gastro-esophageal reflux disease without esophagitis: Secondary | ICD-10-CM

## 2024-01-17 MED ORDER — NALOXEGOL OXALATE 12.5 MG PO TABS
12.5000 mg | ORAL_TABLET | Freq: Every day | ORAL | 6 refills | Status: DC
  Filled 2024-01-17: qty 30, 30d supply, fill #0
  Filled 2024-02-07: qty 30, 30d supply, fill #1

## 2024-01-17 MED ORDER — LEVOTHYROXINE SODIUM 25 MCG PO TABS
25.0000 ug | ORAL_TABLET | Freq: Every day | ORAL | 0 refills | Status: AC
  Filled 2024-01-17: qty 90, 90d supply, fill #0

## 2024-01-17 NOTE — Progress Notes (Signed)
 Chief Complaint:    Constipation, change in stools  GI History: 78 year old female with a history of immunoglobulin deficiency (gets IVIG infusions),  OSA,  CAD with stent x 2 in April 2023 (on Plavix ), tracheobronchomalacia, asthma, GERD, non-H.pylori related gastric ulcer Sept 2023, follows in the GI clinic for the following:  1) Lymphocytic colitis.  Diagnosed in 09/2020.  Unable to wean off budesonide  x 2 attempts, and symptoms controlled/in remission with long course of budesonide  3 mg daily.  Eventually titrated off budesonide  completely in 2025 without recurrence of index symptoms.  Still takes PPI for refractory reflux (despite knowing this is a risk factor for microscopic colitis).  No longer on SSRI (now SNRI).   2) Chronic constipation.  Longer standing history of medication induced constipation (OIC) with abdominal cramping.   3) GERD.  Longstanding history of reflux with primarily LPR symptoms.  Symptoms well-controlled with Protonix  40 mg daily and Pepcid  40 mg at night.  Endoscopic History: - Colonoscopy (05/2020): Benign rectal polyp, otherwise normal.  Biopsies with Lymphocytic Colitis.  Internal hemorrhoids.  Normal TI - EGD (11/05/2021, inpatient): LA Grade A esophagitis, 10 mm clean-based gastric ulcer (path: Gastritis, no H. pylori), Single diverticulum in D2.  Treated with high-dose Protonix , stop all NSAIDs, repeat EGD in 8-10 weeks - EGD (01/05/2022): Normal esophagus, mild patchy gastritis (path benign), previously noted gastric ulcer has since fully healed, small duodenal diverticulum, otherwise normal duodenum   HPI:     Patient is a 78 y.o. female presenting to the Gastroenterology Clinic for follow-up.  Was last seen in GI clinic on 01/20/2023.  At that time, lymphocytic colitis well-controlled with low-dose budesonide .  After extended conversation careful consideration, she elected for continued low-dose budesonide .  Reflux well-controlled with Protonix  and  famotidine .  DEXA from 11/2022 reviewed and notable for low bone density with T-score -0.8 on right and -1.7 on left.  Diarrhea has since resolved and she has since weaned off budesonide  w/o recurrence. Now back to having BM every 1-3 days, which is more in line with her baseline opioid-induced constipation. Follows in Pain Management and prescribed buprenorphine . Will use OTC colace BID and laxative prn if no BM after 3 days.  Consumes high fiber diet and drinks plenty of water  daily.   GERD otherwise well controlled with current therapy. Good appetite and weight stable.       Latest Ref Rng & Units 10/08/2023    3:24 PM 06/10/2023    9:27 AM 03/13/2023    4:16 AM  CBC  WBC 3.4 - 10.8 x10E3/uL 6.8  5.3  9.1   Hemoglobin 11.1 - 15.9 g/dL 85.5  86.3  87.8   Hematocrit 34.0 - 46.6 % 43.5  40.9  36.1   Platelets 150 - 450 x10E3/uL 175  178  142       Latest Ref Rng & Units 10/08/2023    3:24 PM 03/13/2023    4:16 AM 03/11/2023    9:35 AM  BMP  Glucose 70 - 99 mg/dL 91  862  93   BUN 8 - 27 mg/dL 11  11  11    Creatinine 0.57 - 1.00 mg/dL 9.34  9.34  9.47   BUN/Creat Ratio 12 - 28 17     Sodium 134 - 144 mmol/L 142  140  141   Potassium 3.5 - 5.2 mmol/L 4.6  4.3  4.0   Chloride 96 - 106 mmol/L 103  104  108   CO2 20 - 29 mmol/L 23  26  24   Calcium  8.7 - 10.3 mg/dL 9.4  8.9  9.3      Review of systems:     No chest pain, no SOB, no fevers, no urinary sx   Past Medical History:  Diagnosis Date   Allergy 1971   Amaurosis fugax    negative w/u through [facility] right eye   Asthma    no attacks in several yrs per pt on 03-03-2021   Back pain    Dr Loletha MOURNING 02/2010-epidural injection x 2 at L4-5 with good effect   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   right sholder   BCC (basal cell carcinoma of skin) 05/09/2014   mid lower back   BCC (basal cell carcinoma of skin) 05/03/2017   right low back   BCC (basal cell carcinoma of skin) 05/03/2017   left upper back   BCC (basal cell  carcinoma of skin) 07/05/2018   left mid back   BCC (basal cell carcinoma of skin) 05/20/1992   upper back   BCC (basal cell carcinoma of skin) 07/29/1993   left sholder medial   BCC (basal cell carcinoma of skin) 07/29/1993   left sholder lateral   BCC (basal cell carcinoma of skin) 07/29/1993   right thigh   BCC (basal cell carcinoma of skin) 07/29/1993   right sholder   BCC (basal cell carcinoma of skin) 12/22/1994   right mid forearm   BCC (basal cell carcinoma of skin) 12/22/1994   right upper forearm   BCC (basal cell carcinoma of skin) 12/22/1994   lower right upper arm   BCC (basal cell carcinoma of skin) 12/22/1994   right upper arm sholder   BCC (basal cell carcinoma of skin) 08/11/1995   left leg below knee   BCC (basal cell carcinoma of skin) 04/11/2002   mid back   BCC (basal cell carcinoma of skin) 12/10/2002   right center upper back   BCC (basal cell carcinoma of skin) 05/26/2005   right post sholder   BCC (basal cell carcinoma) 05/09/2014   left inner shin   BCC (basal cell carcinoma) 06/12/2014   left forearm   Blood transfusion without reported diagnosis 11/06/21   Bowen's disease 10/07/1994   right post knee, right inner forearm/wrist   Bowen's disease 08/11/1995   left sholder   Bronchiectasis [facility])    Cataract 2010   left   Common variable immunodeficiency [facility])    sees dr tiana ronco 02-11-2021 epic   COPD (chronic obstructive pulmonary disease) (HCC) 2022   Brochiectosis / tracheobrocheomalacia   Depression 02/17/1995   hx of   Diverticulosis of colon 1998   mild   DJD (degenerative joint disease)    Duodenal ulcer    h/o yrs ago   Dysrhythmia    GERD (gastroesophageal reflux disease) 1989   Heart murmur 1949   History of SCC (squamous cell carcinoma) of skin    Dr. Livingston   History of sinus bradycardia    HLD (hyperlipidemia)    hypertriglyceridemia   Hypertensive retinopathy of both eyes 11/18/2017   Hypothyroidism    IBS (irritable  bowel syndrome)    Internal hemorrhoids 1998   Lymphocytic colitis    Mitral regurgitation    mild   Myocardial infarction [facility]) 2023   Cardiac CT 2023 - 2 cardiac stents placed   Nocardiosis    relased by infection disease dec 2022   Osteoarthritis    feet,shoulder,neck,back,hips and hands.   Pneumonia 2015   PONV (postoperative  nausea and vomiting)    Rotator cuff tear, right 02/2019   infraspinatus and supraspinatus, and dislocation of long head of bicep tendon (Dr. Ernie)   SCC (squamous cell carcinoma) 11/26/2014   left hand, right hand, right deltoid mnay areas   SCC (squamous cell carcinoma) 05/03/2017   left cheek   Sleep apnea 2017   uses a mouth guard nightly   Squamous cell carcinoma in situ (SCCIS) 07/05/2018   left hand   Tracheobronchomalacia    Trigger finger    left hand ring finger   Vitamin D  deficiency    mild    Patient's surgical history, family medical history, social history, medications and allergies were all reviewed in Epic    Current Outpatient Medications  Medication Sig Dispense Refill   acetaminophen  (TYLENOL ) 650 MG CR tablet Take 650-1,300 mg by mouth every 8 (eight) hours as needed for pain.     albuterol  (VENTOLIN  HFA) 108 (90 Base) MCG/ACT inhaler Inhale 2 puffs into the lungs every 4 (four) hours as needed for wheezing or shortness of breath. 6.7 g 1   ARTIFICIAL TEAR OP Place 2 drops into both eyes in the morning and at bedtime.     atorvastatin  (LIPITOR) 40 MG tablet Take 1 tablet (40 mg total) by mouth daily. 90 tablet 0   Buprenorphine  HCl (BELBUCA ) 750 MCG FILM Place 750 mcg (1 film) inside cheek 2 (two) times daily. 60 each 2   Buprenorphine  HCl (BELBUCA ) 750 MCG FILM Place 1 film (750 mcg) inside cheek 2 (two) times daily. 60 each 2   Calcium  Carbonate-Vitamin D  600-200 MG-UNIT TABS Take 1 tablet by mouth 2 (two) times daily.     Cholecalciferol  (VITAMIN D -3 PO) Take 1 tablet by mouth daily.     clopidogrel  (PLAVIX ) 75 MG tablet  Take 1 tablet (75 mg total) by mouth daily. 90 tablet 3   diclofenac  Sodium (VOLTAREN ) 1 % GEL Apply 2-4 g topically 4 (four) times daily as needed. 100 g 4   dicyclomine  (BENTYL ) 10 MG capsule Take 1 capsule (10 mg total) by mouth 2 (two) times daily as needed for spasms. 60 capsule 3   EPINEPHrine  0.3 mg/0.3 mL IJ SOAJ injection Inject 0.3 mg into the muscle as needed for anaphylaxis.     famotidine  (PEPCID ) 40 MG tablet Take 1 tablet (40 mg total) by mouth at bedtime. 90 tablet 1   fluorouracil  (EFUDEX ) 5 % cream Apply two times a day to areas on hands for 2 weeks. 40 g 0   guaiFENesin  (MUCINEX ) 600 MG 12 hr tablet Take 600 mg by mouth 2 (two) times daily.     HYDROcodone -acetaminophen  (NORCO) 7.5-325 MG tablet Take 1 tablet by mouth 4 (four) times daily as needed for pain 120 tablet 0   ipratropium (ATROVENT ) 0.06 % nasal spray Place 2 sprays into both nostrils 4 (four) times daily as needed for rhinitis. 45 mL 1   isosorbide  mononitrate (IMDUR ) 30 MG 24 hr tablet Take 1 tablet (30 mg total) by mouth daily. 90 tablet 3   Lactobacillus-Inulin (CULTURELLE ADULT ULT BALANCE) CAPS Take 1 capsule by mouth daily.     levocetirizine (XYZAL ) 5 MG tablet Take 1 tablet (5 mg total) by mouth every evening. 90 tablet 2   levothyroxine  (SYNTHROID ) 25 MCG tablet Take 1 tablet (25 mcg total) by mouth daily. 90 tablet 0   loratadine  (CLARITIN ) 10 MG tablet Take 1 tablet (10 mg total) by mouth daily as needed for allergies (Can take an  extra dose during flare ups.). 180 tablet 1   melatonin 5 MG TABS Take 5 mg by mouth at bedtime.     methocarbamol  (ROBAXIN ) 750 MG tablet Take 1 tablet (750 mg total) by mouth 3 (three) times daily as needed. 270 tablet 2   methocarbamol  (ROBAXIN ) 750 MG tablet Take 1 tablet (750 mg total) by mouth 3 (three) times daily as needed. Stop robaxin  500 mg 270 tablet 2   montelukast  (SINGULAIR ) 10 MG tablet Take 1 tablet (10 mg total) by mouth at bedtime. 90 tablet 1   Multiple  Vitamin (MULTIVITAMIN ADULT PO) Take 1 tablet by mouth daily.     nitroGLYCERIN  (NITROSTAT ) 0.4 MG SL tablet Place 1 tablet (0.4 mg total) under the tongue every 5 (five) minutes as needed. 25 tablet 5   omega-3 acid ethyl esters (LOVAZA ) 1 g capsule Take 2 capsules (2 g total) by mouth 2 (two) times daily. 360 capsule 1   pantoprazole  (PROTONIX ) 40 MG tablet Take 1 tablet (40 mg total) by mouth 2 (two) times daily. 180 tablet 1   pneumococcal 21-valent conjugate vaccine (CAPVAXIVE ) 0.5 ML injection Inject into the muscle. 0.5 mL 0   polyethylene glycol (MIRALAX  / GLYCOLAX ) 17 g packet Take 17 g by mouth daily.     pregabalin  (LYRICA ) 50 MG capsule Take 1 capsule (50 mg total) by mouth every 8 (eight) hours. 270 capsule 2   psyllium (METAMUCIL) 58.6 % packet Take 1 packet by mouth daily.     TART CHERRY PO Take 1 capsule by mouth daily.     Turmeric (QC TUMERIC COMPLEX PO) Take 1 capsule by mouth 2 (two) times daily.     valACYclovir  (VALTREX ) 500 MG tablet Take 1 tablet (500 mg total) by mouth daily. 90 tablet 1   venlafaxine  XR (EFFEXOR  XR) 75 MG 24 hr capsule Take 1 capsule (75 mg total) by mouth daily. 90 capsule 2   XEMBIFY  10 GM/50ML SOLN Inject 10 g into the skin once a week. Wednesday     No current facility-administered medications for this visit.    Physical Exam:     There were no vitals taken for this visit.  GENERAL:  Pleasant female in NAD PSYCH: : Cooperative, normal affect NEURO: Alert and oriented x 3, no focal neurologic deficits   IMPRESSION and PLAN:    1) Opioid Induced Constipation 2) Change in bowel habits - Start Movantik  12.5 mg daily for OIC. Starting with low dose due to co-morbidities, age, and hx of LC. If suboptimal response, go up to 25 mg - Hold Colace and MiraLAX  when starting Movantik  - Counseled on ADR profile of Movantik  - If suboptimal response, will either increase dose to 25 mg daily or can reintroduce MiraLAX  as needed - Continue at least 64  ounces of water  daily - Continue high-fiber diet  3) GERD - Well-controlled on current therapy - Continue pantoprazole  40 mg daily and famotidine  40 mg at bedtime - Continue antireflux lifestyle/dietary modification  4) History of lymphocytic colitis - Has since weaned off budesonide  completely without return of symptoms  5) CVID - Continue close follow-up with Dr. Maurilio in the Immunology clinic   RTC in 6-12 months or sooner prn  I spent 32 minutes of time, including in depth chart review, independent review of results as outlined above, communicating results with the patient directly, face-to-face time with the patient, coordinating care, and ordering studies and medications as appropriate, and documentation.  Sandor LULLA Flatter ,DO, FACG 01/17/2024, 1:34 PM

## 2024-01-17 NOTE — Progress Notes (Deleted)
 Cardiology Office Note:  .   Date:  01/17/2024  ID:  [name], DOB [birth year], MRN [id number] PCP: Theophilus Andrews, Tully GRADE, MD  [facility] Providers Cardiologist:  Maude Emmer, MD { Click to update primary MD,subspecialty MD or APP then REFRESH:1}   History of Present Illness: .   Beverly [name] is a 78 y.o. female with history of coronary artery disease with DES to the LAD and ramus in 05/2021, SVT/nocturnal bradycardia, hyperlipidemia, OSA, ILD with severe tracheobronchomalacia.    Coronary CTA in 2023 was suggestive of tight LAD and ramus.  Patient went cardiac catheterization/3/23 with DES to LAD and DES to ramus.  Recommended for Brilinta  and aspirin  for at least 6 months.  Patient was admitted with acute GI bleed in 10/2021 and Brilinta  was held, she was switched to Plavix .  Cardiac catheterization on 10/12/2023 indicated nonobstructive CAD with continued patency of LAD and ramus stents.  Patient with mid LAD to distal LAD lesion 30% stenosed, mid Cx lesion 15% stenosed, nonstenotic ostial LAD lesion was previously treated, nonstenotic proximal LAD to mid LAD lesion previously treated, LVEF 55 to 65% by visual estimate.      ROS: ***  Studies Reviewed: SABRA         Prior CV Studies: {Select studies to display:26339}   CARDIAC CATHETERIZATION 10/12/2023   Conclusion   Mid LAD to Dist LAD lesion is 30% stenosed.   Mid Cx lesion is 15% stenosed.   Non-stenotic Ost LAD lesion was previously treated.   Non-stenotic Prox LAD to Mid LAD lesion was previously treated.   Non-stenotic Ramus lesion was previously treated.   The left ventricular systolic function is normal.   LV end diastolic pressure is normal.   The left ventricular ejection fraction is 55-65% by visual estimate.   Nonobstructive CAD- continued excellent patency of LAD and ramus stents Normal LV function Normal LVEDP   Plan: continue medical therapy   Findings Coronary Findings Diagnostic   Dominance: Right   Left Main Vessel is large. Vessel is angiographically normal.   Left Anterior Descending Vessel is moderate in size. Non-stenotic Ost LAD lesion was previously treated. The lesion is eccentric. The lesion is calcified. Non-stenotic Prox LAD to Mid LAD lesion was previously treated. Mid LAD to Dist LAD lesion is 30% stenosed.   First Diagonal Branch Vessel is small in size.   Second Diagonal Branch Vessel is small in size.   Ramus Intermedius Vessel is moderate in size. Non-stenotic Ramus lesion was previously treated.   Left Circumflex Vessel is large. Mid Cx lesion is 15% stenosed.   First Obtuse Marginal Branch Vessel is moderate in size.   Second Obtuse Marginal Branch Vessel is small in size.   Third Obtuse Marginal Branch Vessel is moderate in size.   Fourth Obtuse Marginal Branch Vessel is small in size.   Right Coronary Artery Vessel is moderate in size. The vessel exhibits minimal luminal irregularities.   Right Posterior Descending Artery Vessel is small in size.   Right Posterior Atrioventricular Artery Vessel is moderate in size.   First Right Posterolateral Branch Vessel is small in size.   Second Right Posterolateral Branch Vessel is small in size.   Third Right Posterolateral Branch Vessel is small in size.   Intervention   No interventions have been documented.     CARDIAC CATHETERIZATION   CARDIAC CATHETERIZATION 05/19/2021   Conclusion Conclusions: Severe two-vessel coronary artery disease with up to 90% stenoses involving the ostial through  mid LAD and ostial through proximal ramus intermedius.  Mild, nonobstructive CAD noted in the LCx and RCA. Normal left ventricular systolic function (LVEF 55-65%) with normal filling pressure (LVEDP 10 mmHg) Successful IVUS-guided PCI to ostial through mid LAD using Onyx Frontier 2.75 x 38 mm drug-eluting stent with 0% residual stenosis and TIMI-3 flow Successful PCI to ostial  through proximal ramus intermedius using Onyx Frontier 2.0 x 22 mm drug-eluting stent with 0% residual stenosis and TIMI-3 flow.   Recommendations: Overnight extended recovery. Dual antiplatelet therapy with aspirin  and clopidogrel , ideally for 6 months.  Nonemergent surgery will likely need to be delayed to allow for adequate antiplatelet therapy following two-vessel PCI. Aggressive secondary prevention.   [name], MD [facility]   Findings Coronary Findings Diagnostic  Dominance: Right   Left Main Vessel is large. Vessel is angiographically normal.   Left Anterior Descending Vessel is moderate in size. Ost LAD lesion is 50% stenosed. The lesion is eccentric. The lesion is calcified. Prox LAD to Mid LAD lesion is 90% stenosed. Ultrasound (IVUS) was performed. Severe plaque burden was detected. IVUS has determined that the lesion is heterogeneous. Mid LAD to Dist LAD lesion is 30% stenosed.   First Diagonal Branch Vessel is small in size.   Second Diagonal Branch Vessel is small in size.   Ramus Intermedius Vessel is moderate in size. Ramus lesion is 90% stenosed.   Left Circumflex Vessel is large. Mid Cx lesion is 15% stenosed.   First Obtuse Marginal Branch Vessel is moderate in size.   Second Obtuse Marginal Branch Vessel is small in size.   Third Obtuse Marginal Branch Vessel is moderate in size.   Fourth Obtuse Marginal Branch Vessel is small in size.   Right Coronary Artery Vessel is moderate in size. The vessel exhibits minimal luminal irregularities.   Right Posterior Descending Artery Vessel is small in size.   Right Posterior Atrioventricular Artery Vessel is moderate in size.   First Right Posterolateral Branch Vessel is small in size.   Second Right Posterolateral Branch Vessel is small in size.   Third Right Posterolateral Branch Vessel is small in size.   Intervention   Ost LAD lesion Stent (Also treats lesions: Prox LAD  to Mid LAD) Lesion length:  36 mm. CATH VISTA GUIDE 6FR XBLAD3.5 guide catheter was inserted. Lesion crossed with guidewire using a WIRE RUNTHROUGH .[id number]. Pre-stent angioplasty was performed using a BALLN SAPPHIRE 2.5X15. Maximum pressure:  6 atm. A drug-eluting stent was successfully placed using a STENT ONYX FRONTIER [id number]. Maximum pressure: 16 atm. Stent strut is well apposed. Post-stent angioplasty was performed using a BALL SAPPHIRE NC24 3.0X22. Maximum pressure:  18 atm. Proximal edge of stent was further postdilated with an [place] 3.25 x 8 mm balloon at 18 atm. Post-Intervention Lesion Assessment The intervention was successful. Pre-interventional TIMI flow is 3. Post-intervention TIMI flow is 3. No complications occurred at this lesion. There is a 0% residual stenosis post intervention.   Prox LAD to Mid LAD lesion Stent (Also treats lesions: Ost LAD) See details in Ost LAD lesion. Post-Intervention Lesion Assessment The intervention was successful. Pre-interventional TIMI flow is 3. Post-intervention TIMI flow is 3. No complications occurred at this lesion. There is a 0% residual stenosis post intervention.   Ramus lesion Stent Lesion length:  18 mm. CATH VISTA GUIDE 6FR XBLAD3.5 guide catheter was inserted. Lesion crossed with guidewire using a WIRE HI TORQ BMW 190CM. Pre-stent angioplasty was performed using a BALLN SAPPHIRE 2.0X12. Maximum pressure:  6 atm. A drug-elutingstent was successfully placed using a STENT ONYX FRONTIER 2.0X22. Maximum pressure: 16 atm. Stent strut is well apposed. Post-stent angioplasty was performed using a BALLN SAPPHIRE  2.25X15. Maximum pressure:  16 atm. Post-Intervention Lesion Assessment The intervention was successful. Pre-interventional TIMI flow is 3. Post-intervention TIMI flow is 3. No complications occurred at this lesion. There is a 0% residual stenosis post intervention.             CT SCANS   CT CORONARY FRACTIONAL FLOW  RESERVE DATA PREP 05/16/2021   Narrative EXAM: CT FFR ANALYSIS   CLINICAL DATA:  abnormal coronary CT   FINDINGS: FFRct analysis was performed on the original cardiac CT angiogram dataset. Diagrammatic representation of the FFRct analysis is provided in a separate PDF document in PACS. This dictation was created using the PDF document and an interactive 3D model of the results. 3D model is not available in the EMR/PACS. Normal FFR range is >0.80. Indeterminate (grey) zone is 0.76-0.80.   1. Left Main: FFR = 0.99   2. LAD: Proximal FFR = 0.82, mid FFR = 0.70, distal FFR = 0.66 3. Ramus intermedius: Proximal FFR = 0.68, mid FFR = 0.68 4. LCX: Proximal FFR = 0.97, distal FFR = 0.86 5. RCA: Proximal FFR = 0.93, mid FFR =0.85, Distal FFR = 0.80   IMPRESSION: 1. CT FFR analysis showed significant stenosis in the mid LAD and proximal ramus intermedius.   RECOMMENDATIONS: Consider cardiac catheterization.   Guideline-directed medical therapy and aggressive risk factor modification for secondary prevention of coronary artery disease.     Electronically Signed By: Soyla Merck M.D. On: 05/16/2021 17:09     CT SCANS   CT CORONARY MORPH W/CTA COR W/SCORE 05/16/2021   Addendum 05/16/2021  5:05 PM ADDENDUM REPORT: 05/16/2021 17:03   HISTORY: Pre-op noncardiac surgery, moderate risk   EXAM: Cardiac/Coronary  CT   TECHNIQUE: The patient was scanned on a [employer].   PROTOCOL: A 120 kV prospective scan was triggered in the descending thoracic aorta at 111 HU's. Axial non-contrast 3 mm slices were carried out through the heart. The data set was analyzed on a dedicated work station and scored using the Agatston method. Gantry rotation speed was 250 msecs and collimation was .6 mm. Beta blockade and 0.8 mg of sl NTG was given. The 3D data set was reconstructed in 5% intervals of the 35-75 % of the R-R cycle. Systolic and diastolic phases were analyzed on a  dedicated work station using MPR, MIP and VRT modes. The patient received 95mL OMNIPAQUE  IOHEXOL  350 MG/ML SOLN contrast.   FINDINGS: Image quality: Good   Noise artifact is: Moderate misregistration due to respiratory motion artifact.   Coronary calcium  score is 31, which places the patient in the 49th percentile for age and sex matched control.   Coronary arteries: Normal coronary origins.  Right dominance.   Right Coronary Artery: Mild atherosclerotic plaque in the proximal RCA, 25-49% stenosis. Moderate mixed atherosclerotic plaque in the mid RCA, 50-69% stenosis.   Left Main Coronary Artery: No detectable plaque or stenosis.   Left Anterior Descending Coronary Artery: Severe, tubular mixed atherosclerotic plaque in the proximal LAD tortuous segment, 70-99% stenosis. Long segment tubular stenosis extends to the mid LAD and appears at least moderate, 50-69% stenosis.   Ramus intermedius: heavily calcified proximal RI with probable severe stenosis 70-99%.   Left Circumflex Artery: Moderate atherosclerotic plaque in the distal L circumflex, 50-69% stenosis.   Aorta: Normal size,  29 mm at the mid ascending aorta (level of the PA bifurcation) measured double oblique. No calcifications. No dissection.   Aortic Valve: No calcifications.   Other findings:   Normal pulmonary vein drainage into the left atrium.   Normal left atrial appendage without thrombus.   Normal size of the pulmonary artery.   IMPRESSION: 1. Severe CAD in proximal LAD and proximal ramus intermedius, CADRADS = 4. CT FFR will be performed and reported separately.   2. Coronary calcium  score is 31, which places the patient in the 49th percentile for age and sex matched control.   3. Normal coronary origins with right dominance.     Electronically Signed By: Soyla Merck M.D. On: 05/16/2021 17:03   Narrative EXAM: OVER-READ INTERPRETATION  CT CHEST   The following report is an over-read  performed by radiologist Dr. Reyes Holder of [facility] on 05/16/2021. This over-read does not include interpretation of cardiac or coronary anatomy or pathology. The coronary calcium  score/coronary CTA interpretation by the cardiologist is attached.   COMPARISON:  Jun 20, 2019.   FINDINGS: Vascular: No significant noncardiac vascular findings.   Mediastinum/Nodes: No mediastinal or hilar adenopathy in the acquired field-of-view. Visualized portions of the esophagus are unremarkable.   Lungs/Pleura: No suspicious pulmonary nodules or masses and no pleural effusion or pneumothorax in the acquired field-of-view.   Upper Abdomen: No acute abnormality.   Musculoskeletal: Multilevel degenerative changes spine. No acute osseous abnormality.   IMPRESSION: No acute or new significant noncardiac finding.   Electronically Signed: By: Reyes Holder M.D. On: 05/16/2021 12:44    Risk Assessment/Calculations:   {Does this patient have ATRIAL FIBRILLATION?:[phone]}         Physical Exam:   VS:  There were no vitals taken for this visit.   Orhtostatics: No data found. Wt Readings from Last 3 Encounters:  01/17/24 153 lb (69.4 kg)  11/09/23 152 lb 8 oz (69.2 kg)  11/05/23 151 lb 9.6 oz (68.8 kg)    GEN: Well nourished, well developed in no acute distress NECK: No JVD; No carotid bruits CARDIAC: ***RRR, no murmurs, rubs, gallops RESPIRATORY:  Clear to auscultation without rales, wheezing or rhonchi  ABDOMEN: Soft, non-tender, non-distended EXTREMITIES:  No edema; No deformity   ASSESSMENT AND PLAN: .    CAD: Patient with prior DES to LAD and DES to ramus in 2023.  Patient presented when 09/2023 with nitro and responsive chest pain.  Underwent cardiac catheterization with Dr. Jordan on 10/12/2023 that showed nonobstructive CAD and continued excellent patency of LAD and ramus stents  HLD  SVT/nocturnal brady  ILD  OSA     {Are you ordering a CV Procedure  (e.g. stress test, cath, DCCV, TEE, etc)?   Press F2        :[id number]}  Dispo: ***  Signed, Olivia Pavy, PA-C

## 2024-01-17 NOTE — Patient Instructions (Addendum)
 _______________________________________________________  If your blood pressure at your visit was 140/90 or greater, please contact your primary care physician to follow up on this.  _______________________________________________________  If you are age 76 or older, your body mass index should be between 23-30. Your Body mass index is 25.86 kg/m. If this is out of the aforementioned range listed, please consider follow up with your Primary Care Provider.  If you are age 38 or younger, your body mass index should be between 19-25. Your Body mass index is 25.86 kg/m. If this is out of the aformentioned range listed, please consider follow up with your Primary Care Provider.   ________________________________________________________  The [facility] providers would like to encourage you to use MYCHART to communicate with providers for non-urgent requests or questions.  Due to long hold times on the telephone, sending your provider a message by [facility] may be a faster and more efficient way to get a response.  Please allow 48 business hours for a response.  Please remember that this is for non-urgent requests.  _______________________________________________________  [facility] is using a team-based approach to care.  Your team is made up of your doctor and two to three APPS. Our APPS (Nurse Practitioners and Physician Assistants) work with your physician to ensure care continuity for you. They are fully qualified to address your health concerns and develop a treatment plan. They communicate directly with your gastroenterologist to care for you. Seeing the Advanced Practice Practitioners on your physician's team can help you by facilitating care more promptly, often allowing for earlier appointments, access to diagnostic testing, procedures, and other specialty referrals.   We have sent the following medications to your pharmacy for you to pick up at your convenience:  START: Movantik   12.5mg  one tablet daily  It was a pleasure to see you today!  Vito Cirigliano, D.O.

## 2024-01-20 DIAGNOSIS — Z08 Encounter for follow-up examination after completed treatment for malignant neoplasm: Secondary | ICD-10-CM | POA: Diagnosis not present

## 2024-01-20 DIAGNOSIS — L820 Inflamed seborrheic keratosis: Secondary | ICD-10-CM | POA: Diagnosis not present

## 2024-01-20 DIAGNOSIS — L538 Other specified erythematous conditions: Secondary | ICD-10-CM | POA: Diagnosis not present

## 2024-01-20 DIAGNOSIS — Z85828 Personal history of other malignant neoplasm of skin: Secondary | ICD-10-CM | POA: Diagnosis not present

## 2024-01-20 DIAGNOSIS — D1801 Hemangioma of skin and subcutaneous tissue: Secondary | ICD-10-CM | POA: Diagnosis not present

## 2024-01-20 DIAGNOSIS — Z86007 Personal history of in-situ neoplasm of skin: Secondary | ICD-10-CM | POA: Diagnosis not present

## 2024-01-20 DIAGNOSIS — L814 Other melanin hyperpigmentation: Secondary | ICD-10-CM | POA: Diagnosis not present

## 2024-01-20 DIAGNOSIS — L821 Other seborrheic keratosis: Secondary | ICD-10-CM | POA: Diagnosis not present

## 2024-01-20 DIAGNOSIS — Z789 Other specified health status: Secondary | ICD-10-CM | POA: Diagnosis not present

## 2024-01-25 ENCOUNTER — Other Ambulatory Visit (HOSPITAL_COMMUNITY): Payer: Self-pay

## 2024-01-25 DIAGNOSIS — M150 Primary generalized (osteo)arthritis: Secondary | ICD-10-CM | POA: Diagnosis not present

## 2024-01-25 DIAGNOSIS — Z79891 Long term (current) use of opiate analgesic: Secondary | ICD-10-CM | POA: Diagnosis not present

## 2024-01-25 DIAGNOSIS — G894 Chronic pain syndrome: Secondary | ICD-10-CM | POA: Diagnosis not present

## 2024-01-25 DIAGNOSIS — M47816 Spondylosis without myelopathy or radiculopathy, lumbar region: Secondary | ICD-10-CM | POA: Diagnosis not present

## 2024-01-25 MED ORDER — BELBUCA 750 MCG BU FILM: 750.0000 ug | ORAL_FILM | Freq: Two times a day (BID) | BUCCAL | 2 refills | Status: AC

## 2024-01-25 MED ORDER — VENLAFAXINE HCL ER 75 MG PO CP24: 75.0000 mg | ORAL_CAPSULE | Freq: Every day | ORAL | 2 refills | Status: AC

## 2024-01-26 ENCOUNTER — Other Ambulatory Visit (HOSPITAL_COMMUNITY): Payer: Self-pay

## 2024-01-26 ENCOUNTER — Other Ambulatory Visit: Payer: Self-pay | Admitting: Internal Medicine

## 2024-01-26 DIAGNOSIS — E782 Mixed hyperlipidemia: Secondary | ICD-10-CM

## 2024-01-26 MED ORDER — ATORVASTATIN CALCIUM 40 MG PO TABS
40.0000 mg | ORAL_TABLET | Freq: Every day | ORAL | 0 refills | Status: DC
  Filled 2024-01-26: qty 90, 90d supply, fill #0

## 2024-01-28 ENCOUNTER — Other Ambulatory Visit: Payer: Self-pay

## 2024-01-28 ENCOUNTER — Other Ambulatory Visit (HOSPITAL_COMMUNITY): Payer: Self-pay

## 2024-01-31 ENCOUNTER — Ambulatory Visit: Admitting: Physician Assistant

## 2024-01-31 ENCOUNTER — Encounter: Payer: Self-pay | Admitting: Internal Medicine

## 2024-01-31 ENCOUNTER — Encounter: Payer: Self-pay | Admitting: Gastroenterology

## 2024-02-01 ENCOUNTER — Other Ambulatory Visit (HOSPITAL_COMMUNITY): Payer: Self-pay

## 2024-02-03 ENCOUNTER — Other Ambulatory Visit: Payer: Self-pay | Admitting: Internal Medicine

## 2024-02-03 ENCOUNTER — Other Ambulatory Visit (HOSPITAL_COMMUNITY): Payer: Self-pay

## 2024-02-03 ENCOUNTER — Other Ambulatory Visit: Payer: Self-pay

## 2024-02-03 DIAGNOSIS — E782 Mixed hyperlipidemia: Secondary | ICD-10-CM

## 2024-02-03 MED ORDER — ATORVASTATIN CALCIUM 40 MG PO TABS
40.0000 mg | ORAL_TABLET | Freq: Every day | ORAL | 0 refills | Status: AC
  Filled 2024-02-03 – 2024-02-07 (×2): qty 90, 90d supply, fill #0

## 2024-02-07 ENCOUNTER — Other Ambulatory Visit: Payer: Self-pay

## 2024-02-07 ENCOUNTER — Other Ambulatory Visit (HOSPITAL_COMMUNITY): Payer: Self-pay

## 2024-02-07 MED ORDER — NALOXEGOL OXALATE 12.5 MG PO TABS
12.5000 mg | ORAL_TABLET | Freq: Every day | ORAL | 3 refills | Status: AC
  Filled 2024-02-07 – 2024-02-12 (×2): qty 90, 90d supply, fill #0

## 2024-02-13 ENCOUNTER — Other Ambulatory Visit (HOSPITAL_COMMUNITY): Payer: Self-pay

## 2024-02-14 ENCOUNTER — Other Ambulatory Visit (HOSPITAL_COMMUNITY): Payer: Self-pay

## 2024-02-15 ENCOUNTER — Other Ambulatory Visit: Payer: Self-pay

## 2024-02-15 ENCOUNTER — Other Ambulatory Visit (HOSPITAL_COMMUNITY): Payer: Self-pay

## 2024-02-16 ENCOUNTER — Other Ambulatory Visit: Payer: Self-pay

## 2024-02-16 ENCOUNTER — Encounter: Payer: Self-pay | Admitting: Pharmacist

## 2024-02-16 ENCOUNTER — Other Ambulatory Visit (HOSPITAL_COMMUNITY): Payer: Self-pay

## 2024-02-18 ENCOUNTER — Ambulatory Visit

## 2024-02-18 VITALS — BP 120/60 | HR 66 | Temp 97.7°F | Ht 64.5 in | Wt 154.8 lb

## 2024-02-18 DIAGNOSIS — Z0000 Encounter for general adult medical examination without abnormal findings: Secondary | ICD-10-CM | POA: Diagnosis not present

## 2024-02-18 NOTE — Progress Notes (Signed)
 "  Chief Complaint  Patient presents with   Medicare Wellness     Subjective:   Beverly [name] is a 79 y.o. female who presents for a Medicare Annual Wellness Visit.  Visit info / Clinical Intake: Medicare Wellness Visit Type:: Subsequent Annual Wellness Visit Medicare Wellness Visit Mode:: In-person (required for WTM) Interpreter Needed?: No Pre-visit prep was completed: yes AWV questionnaire completed by patient prior to visit?: yes Date:: 02/14/24 Living arrangements:: (!) lives alone Patient's Overall Health Status Rating: very good Typical amount of pain: some Does pain affect daily life?: no Are you currently prescribed opioids?: (!) yes  Dietary Habits and Nutritional Risks How many meals a day?: 2 Eats fruit and vegetables daily?: yes Most meals are obtained by: preparing own meals In the last 2 weeks, have you had any of the following?: none Diabetic:: no  Functional Status Activities of Daily Living (to include ambulation/medication): Independent Ambulation: Independent with device- listed below Home Assistive Devices/Equipment: Eyeglasses Medication Administration: Independent Home Management (perform basic housework or laundry): Independent Manage your own finances?: yes Primary transportation is: driving Concerns about vision?: no *vision screening is required for WTM* Concerns about hearing?: no  Fall Screening Falls in the past year?: 1 Number of falls in past year: 0 Was there an injury with Fall?: 0 Fall Risk Category Calculator: 1 Patient Fall Risk Level: Low Fall Risk  Fall Risk Patient at Risk for Falls Due to: Impaired balance/gait Fall risk Follow up: Falls evaluation completed; Education provided  Home and Transportation Safety: All rugs have non-skid backing?: yes All stairs or steps have railings?: N/A, no stairs Grab bars in the bathtub or shower?: yes Have non-skid surface in bathtub or shower?: yes Good home lighting?:  yes Regular seat belt use?: yes Hospital stays in the last year:: (!) yes How many hospital stays:: 1 Reason: Knee replacement  Cognitive Assessment Difficulty concentrating, remembering, or making decisions? : no Will 6CIT or Mini Cog be Completed: yes What year is it?: 0 points What month is it?: 0 points Give patient an address phrase to remember (5 components): [address] [place]  About what time is it?: 0 points Count backwards from 20 to 1: 0 points Say the months of the year in reverse: 0 points Repeat the address phrase from earlier: 0 points 6 CIT Score: 0 points  Advance Directives (For Healthcare) Does Patient Have a Medical Advance Directive?: Yes Does patient want to make changes to medical advance directive?: No - Patient declined Type of Advance Directive: Healthcare Power of [place]; Living will Copy of Healthcare Power of Attorney in Chart?: Yes - validated most recent copy scanned in chart (See row information) Copy of Living Will in Chart?: Yes - validated most recent copy scanned in chart (See row information)  Reviewed/Updated  Reviewed/Updated: Reviewed All (Medical, Surgical, Family, Medications, Allergies, Care Teams, Patient Goals)    Allergies (verified) Other, Adhesive [tape], and Codeine   Current Medications (verified) Outpatient Encounter Medications as of 02/18/2024  Medication Sig   acetaminophen  (TYLENOL ) 650 MG CR tablet Take 650-1,300 mg by mouth every 8 (eight) hours as needed for pain.   albuterol  (VENTOLIN  HFA) 108 (90 Base) MCG/ACT inhaler Inhale 2 puffs into the lungs every 4 (four) hours as needed for wheezing or shortness of breath.   ARTIFICIAL TEAR OP Place 2 drops into both eyes in the morning and at bedtime.   atorvastatin  (LIPITOR) 40 MG tablet Take 1 tablet (40 mg total) by mouth daily.  Buprenorphine  HCl (BELBUCA ) 750 MCG FILM Place 750 mcg (1 film) inside cheek 2 (two) times daily.   Buprenorphine  HCl (BELBUCA ) 750  MCG FILM Place 1 film (750 mcg) inside cheek 2 (two) times daily.   Buprenorphine  HCl (BELBUCA ) 750 MCG FILM Place 750 mcg inside cheek 2 (two) times daily.   Calcium  Carbonate-Vitamin D  600-200 MG-UNIT TABS Take 1 tablet by mouth 2 (two) times daily.   Cholecalciferol  (VITAMIN D -3 PO) Take 1 tablet by mouth daily.   clopidogrel  (PLAVIX ) 75 MG tablet Take 1 tablet (75 mg total) by mouth daily.   diclofenac  Sodium (VOLTAREN ) 1 % GEL Apply 2-4 g topically 4 (four) times daily as needed.   dicyclomine  (BENTYL ) 10 MG capsule Take 1 capsule (10 mg total) by mouth 2 (two) times daily as needed for spasms.   EPINEPHrine  0.3 mg/0.3 mL IJ SOAJ injection Inject 0.3 mg into the muscle as needed for anaphylaxis.   famotidine  (PEPCID ) 40 MG tablet Take 1 tablet (40 mg total) by mouth at bedtime.   fluorouracil  (EFUDEX ) 5 % cream Apply two times a day to areas on hands for 2 weeks.   guaiFENesin  (MUCINEX ) 600 MG 12 hr tablet Take 600 mg by mouth 2 (two) times daily.   HYDROcodone -acetaminophen  (NORCO) 7.5-325 MG tablet Take 1 tablet by mouth 4 (four) times daily as needed for pain   ipratropium (ATROVENT ) 0.06 % nasal spray Place 2 sprays into both nostrils 4 (four) times daily as needed for rhinitis.   isosorbide  mononitrate (IMDUR ) 30 MG 24 hr tablet Take 1 tablet (30 mg total) by mouth daily.   Lactobacillus-Inulin (CULTURELLE ADULT ULT BALANCE) CAPS Take 1 capsule by mouth daily.   levocetirizine (XYZAL ) 5 MG tablet Take 1 tablet (5 mg total) by mouth every evening.   levothyroxine  (SYNTHROID ) 25 MCG tablet Take 1 tablet (25 mcg total) by mouth daily.   loratadine  (CLARITIN ) 10 MG tablet Take 1 tablet (10 mg total) by mouth daily as needed for allergies (Can take an extra dose during flare ups.).   melatonin 5 MG TABS Take 5 mg by mouth at bedtime.   methocarbamol  (ROBAXIN ) 750 MG tablet Take 1 tablet (750 mg total) by mouth 3 (three) times daily as needed.   methocarbamol  (ROBAXIN ) 750 MG tablet Take 1  tablet (750 mg total) by mouth 3 (three) times daily as needed. Stop robaxin  500 mg   montelukast  (SINGULAIR ) 10 MG tablet Take 1 tablet (10 mg total) by mouth at bedtime.   Multiple Vitamin (MULTIVITAMIN ADULT PO) Take 1 tablet by mouth daily.   naloxegol  oxalate (MOVANTIK ) 12.5 MG TABS tablet Take 1 tablet (12.5 mg total) by mouth daily.   nitroGLYCERIN  (NITROSTAT ) 0.4 MG SL tablet Place 1 tablet (0.4 mg total) under the tongue every 5 (five) minutes as needed.   omega-3 acid ethyl esters (LOVAZA ) 1 g capsule Take 2 capsules (2 g total) by mouth 2 (two) times daily.   pantoprazole  (PROTONIX ) 40 MG tablet Take 1 tablet (40 mg total) by mouth 2 (two) times daily.   pneumococcal 21-valent conjugate vaccine (CAPVAXIVE ) 0.5 ML injection Inject into the muscle.   polyethylene glycol (MIRALAX  / GLYCOLAX ) 17 g packet Take 17 g by mouth daily.   pregabalin  (LYRICA ) 50 MG capsule Take 1 capsule (50 mg total) by mouth every 8 (eight) hours.   psyllium (METAMUCIL) 58.6 % packet Take 1 packet by mouth daily.   TART CHERRY PO Take 1 capsule by mouth daily.   Turmeric (QC TUMERIC COMPLEX  PO) Take 1 capsule by mouth 2 (two) times daily.   valACYclovir  (VALTREX ) 500 MG tablet Take 1 tablet (500 mg total) by mouth daily.   venlafaxine  XR (EFFEXOR  XR) 75 MG 24 hr capsule Take 1 capsule (75 mg total) by mouth daily.   venlafaxine  XR (EFFEXOR  XR) 75 MG 24 hr capsule Take 1 capsule (75 mg total) by mouth daily.   XEMBIFY  10 GM/50ML SOLN Inject 10 g into the skin once a week. Wednesday   [DISCONTINUED] potassium chloride  (KLOR-CON ) 10 MEQ tablet Take 2 tablets (total 20 MEQ) by mouth once   No facility-administered encounter medications on file as of 02/18/2024.    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   right sholder   BCC (basal cell carcinoma of skin) 05/09/2014   mid lower back   BCC (basal cell carcinoma of skin) 05/03/2017   right low back   BCC (basal cell carcinoma of skin) 05/03/2017   left upper back   BCC (basal cell carcinoma of skin) 07/05/2018   left mid back   BCC (basal cell carcinoma of skin) 05/20/1992   upper back   BCC (basal cell carcinoma of skin) 07/29/1993   left sholder medial   BCC (basal cell carcinoma of skin) 07/29/1993   left sholder lateral   BCC (basal cell carcinoma of skin) 07/29/1993   right thigh   BCC (basal cell carcinoma of skin) 07/29/1993   right sholder   BCC (basal cell carcinoma of skin) 12/22/1994   right mid forearm   BCC (basal cell carcinoma of skin) 12/22/1994   right upper forearm   BCC (basal cell carcinoma of skin) 12/22/1994   lower right upper arm   BCC (basal cell carcinoma of skin) 12/22/1994   right upper arm sholder   BCC (basal cell carcinoma of skin) 08/11/1995   left leg below knee   BCC (basal cell carcinoma of skin) 04/11/2002   mid back   BCC (basal cell carcinoma of skin) 12/10/2002   right center upper back   BCC (basal cell carcinoma of skin) 05/26/2005   right post sholder   BCC (basal cell carcinoma) 05/09/2014   left inner shin   BCC (basal cell carcinoma) 06/12/2014   left forearm   Blood transfusion without reported diagnosis 11/06/21   Bowen's disease 10/07/1994   right post knee, right inner forearm/wrist   Bowen's disease 08/11/1995   left sholder   Bronchiectasis [facility])    Cataract 2010   left   Common variable immunodeficiency [facility])    sees dr tiana ronco 02-11-2021 epic   COPD (chronic obstructive pulmonary disease) (HCC) 2022   Brochiectosis / tracheobrocheomalacia   Depression 02/17/1995   hx of   Diverticulosis of colon 1998   mild   DJD (degenerative joint disease)    Duodenal ulcer    h/o yrs ago   Dysrhythmia    GERD  (gastroesophageal reflux disease) 1989   Heart murmur 1949   History of SCC (squamous cell carcinoma) of skin    Dr. Livingston   History of sinus bradycardia    HLD (hyperlipidemia)    hypertriglyceridemia   Hypertensive retinopathy of both  eyes 11/18/2017   Hypothyroidism    IBS (irritable bowel syndrome)    Internal hemorrhoids 1998   Lymphocytic colitis    Mitral regurgitation    mild   Myocardial infarction [facility]) 2023   Cardiac CT 2023 - 2 cardiac stents placed   Nocardiosis    relased by infection disease dec 2022   Osteoarthritis    feet,shoulder,neck,back,hips and hands.   Pneumonia 2015   PONV (postoperative nausea and vomiting)    Rotator cuff tear, right 02/2019   infraspinatus and supraspinatus, and dislocation of long head of bicep tendon (Dr. Ernie)   SCC (squamous cell carcinoma) 11/26/2014   left hand, right hand, right deltoid mnay areas   SCC (squamous cell carcinoma) 05/03/2017   left cheek   Sleep apnea 2017   uses a mouth guard nightly   Squamous cell carcinoma in situ (SCCIS) 07/05/2018   left hand   Tracheobronchomalacia    Trigger finger    left hand ring finger   Vitamin D  deficiency    mild   Past Surgical History:  Procedure Laterality Date   ABDOMINAL EXPOSURE N/A 02/02/2022   Procedure: ABDOMINAL EXPOSURE;  Surgeon: Gretta Lonni PARAS, MD;  Location: [facility] OR;  Service: Vascular;  Laterality: N/A;   ABDOMINAL HYSTERECTOMY  1997   complete   BIOPSY  11/05/2021   Procedure: BIOPSY;  Surgeon: Eda Iha, MD;  Location: [facility] ENDOSCOPY;  Service: Gastroenterology;;   BLEPHAROPLASTY Bilateral 01/2018   BROW LIFT Bilateral 10/10/2018   Procedure: DORY LIFT;  Surgeon: Lowery Estefana RAMAN, DO;  Location: [facility];  Service: Plastics;  Laterality: Bilateral;   BUNIONECTOMY     R 12/08, L 2004 (Dr. Teresita)   CARDIAC CATHETERIZATION     CARPAL TUNNEL RELEASE  1989   bilateral   CARPAL TUNNEL RELEASE Left 03/06/2021    Procedure: Left Revision Carpal Tunnel Release with hypothenar fat pad flap;  Surgeon: Alyse Agent, MD;  Location: [facility];  Service: Orthopedics;  Laterality: Left;  with local anesthesia   CATARACT EXTRACTION Bilateral Left in 08/2012, Right 04/2016   Dr.Hecker   CESAREAN SECTION     1981 and 1986   CHOLECYSTECTOMY  1992   laparoscopic   CLOSED REDUCTION NASAL FRACTURE N/A 08/29/2018   Procedure: CLOSED REDUCTION NASAL FRACTURE;  Surgeon: Lowery Estefana RAMAN, DO;  Location: [facility];  Service: Plastics;  Laterality: N/A;  1 hour, please   COLONOSCOPY  2006   COLONOSCOPY  01/2012   due again 01/2021; mild diverticulosis   CORONARY STENT INTERVENTION N/A 05/19/2021   Procedure: CORONARY STENT INTERVENTION;  Surgeon: Mady Lonni, MD;  Location: [facility] INVASIVE CV LAB;  Service: Cardiovascular;  Laterality: N/A;   CORONARY ULTRASOUND/IVUS N/A 05/19/2021   Procedure: Intravascular Ultrasound/IVUS;  Surgeon: Mady Lonni, MD;  Location: [facility] INVASIVE CV LAB;  Service: Cardiovascular;  Laterality: N/A;   COSMETIC SURGERY  2018   epidural steroid injection, back  02/2010   ESOPHAGOGASTRODUODENOSCOPY (EGD) WITH PROPOFOL  N/A 11/05/2021   Procedure: ESOPHAGOGASTRODUODENOSCOPY (EGD) WITH PROPOFOL ;  Surgeon: Eda Iha, MD;  Location: [facility] END[facility]COPY;  Service: Gastroenterology;  Laterality: N/A;   EYE SURGERY  2017   HIP SURGERY     right bursectomy x 3   HIP SURGERY Right 2000   torn cartilage, repaired   INGUINAL HERNIA REPAIR  10/2007   bilat   JOINT REPLACEMENT  04/11/2019   Right shoulder   LEFT HEART CATH AND CORONARY ANGIOGRAPHY N/A 05/19/2021   Procedure:  LEFT HEART CATH AND CORONARY ANGIOGRAPHY;  Surgeon: Mady Bruckner, MD;  Location: [facility] INVASIVE CV LAB;  Service: Cardiovascular;  Laterality: N/A;   LEFT HEART CATH AND CORONARY ANGIOGRAPHY N/A 10/12/2023   Procedure: LEFT HEART CATH AND CORONARY ANGIOGRAPHY;  Surgeon: Jordan, [name] M, MD;   Location: [facility] INVASIVE CV LAB;  Service: Cardiovascular;  Laterality: N/A;   LUMBAR FUSION  01/2022   NECK SURGERY  1989   c6-7 cervical laminectomy and diskecotmy   REVERSE SHOULDER ARTHROPLASTY Right 04/11/2019   Procedure: REVERSE SHOULDER ARTHROPLASTY;  Surgeon: Sharl Selinda Dover, MD;  Location: [facility] OR;  Service: Orthopedics;  Laterality: Right;  Regional Block   SHOULDER SURGERY Left 03/2003   left rotator cuff repair   SHOULDER SURGERY Left 03/14/2018   rotator cuff repair; Dr. Duwayne   SPINE SURGERY  1989   TONSILLECTOMY  age 40   TOTAL KNEE ARTHROPLASTY Right 03/12/2023   Procedure: TOTAL KNEE ARTHROPLASTY;  Surgeon: Kay Kemps, MD;  Location: [facility] ORS;  Service: Orthopedics;  Laterality: Right;   TROCHANTERIC BURSA EXCISION Right 1978   TUBAL LIGATION  1986i   UPPER GASTROINTESTINAL ENDOSCOPY     WISDOM TOOTH EXTRACTION     as teenager   Family History  Problem Relation Age of Onset   Depression Mother    Schizophrenia Mother    Arthritis Mother    Cancer Father        oral   Heart disease Father        bradycardia   Esophageal cancer Father    Cancer Sister        skin and lung   COPD Sister    Hypertension Sister    Osteoporosis Sister        compression fx's x 3 11/2018   Depression Sister    Miscarriages / Stillbirths Sister    Hyperthyroidism Brother    Cancer Brother        metastatic cancer to bone (?primary)   Drug abuse Brother    Learning disabilities Brother    Diabetes Maternal Grandmother    Hearing loss Maternal Grandfather    Cancer Paternal Grandmother 48       colon cancer, metastatic to liver   Colon cancer Paternal Grandmother    Liver cancer Paternal Grandmother    Hyperlipidemia Daughter    Asthma Son    ADD / ADHD Other    Social History   Occupational History   Occupation: STAFF Academic Librarian: [employer]   Occupation: Retired  Tobacco Use   Smoking status: Never    Passive exposure: Past   Smokeless  tobacco: Never  Vaping Use   Vaping status: Never Used  Substance and Sexual Activity   Alcohol use: Yes    Alcohol/week: 3.0 standard drinks of alcohol    Types: 3 Glasses of wine per week    Comment: occasional   Drug use: Never   Sexual activity: Not Currently    Birth control/protection: Abstinence, Surgical   Tobacco Counseling Counseling given: No  SDOH Screenings   Food Insecurity: No Food Insecurity (02/18/2024)  Housing: Low Risk (02/18/2024)  Transportation Needs: No Transportation Needs (02/18/2024)  Utilities: Not At Risk (02/18/2024)  Alcohol Screen: Low Risk (02/14/2024)  Depression (PHQ2-9): Low Risk (02/18/2024)  Financial Resource Strain: Low Risk (02/14/2024)  Physical Activity: Insufficiently Active (02/18/2024)  Social Connections: Moderately Integrated (02/18/2024)  Stress: No Stress Concern Present (02/18/2024)  Tobacco Use: Low Risk (02/18/2024)  Health Literacy: Adequate  Health Literacy (02/18/2024)   See flowsheets for full screening details  Depression Screen PHQ 2 & 9 Depression Scale- Over the past 2 weeks, how often have you been bothered by any of the following problems? Little interest or pleasure in doing things: 0 Feeling down, depressed, or hopeless (PHQ Adolescent also includes...irritable): 0 PHQ-2 Total Score: 0     Goals Addressed               This Visit's Progress     Remain active (pt-stated)               Objective:    Today's Vitals   02/18/24 1451  BP: 120/60  Pulse: 66  Temp: 97.7 F (36.5 C)  TempSrc: Oral  SpO2: 97%  Weight: 154 lb 12.8 oz (70.2 kg)  Height: 5' 4.5 (1.638 m)   Body mass index is 26.16 kg/m.  Hearing/Vision screen Hearing Screening - Comments:: Denies hearing difficulties   Vision Screening - Comments:: Wears rx glasses - up to date with routine eye exams with  [facility] Immunizations and Health Maintenance Health Maintenance  Topic Date Due   COVID-19 Vaccine (8 - 2025-26 season) 12/16/2023    Medicare Annual Wellness (AWV)  02/17/2025   DTaP/Tdap/Td (4 - Td or Tdap) 10/19/2033   Pneumococcal Vaccine: 50+ Years  Completed   Influenza Vaccine  Completed   Bone Density Scan  Completed   Hepatitis C Screening  Completed   Zoster Vaccines- Shingrix  Completed   Meningococcal B Vaccine  Aged Out   Mammogram  Discontinued   Colonoscopy  Discontinued        Assessment/Plan:  This is a routine wellness examination for [place].  Patient Care Team: Theophilus Andrews, Tully GRADE, MD as PCP - General (Internal Medicine) Delford Maude BROCKS, MD as PCP - Cardiology (Cardiology) Dea Shiner, MD as Consulting Physician (Infectious Diseases) Livingston Rigg, MD as Consulting Physician (Dermatology) [place] GAILS, DO as Consulting Physician (Gastroenterology)  I have personally reviewed and noted the following in the patients chart:   Medical and social history Use of alcohol, tobacco or illicit drugs  Current medications and supplements including opioid prescriptions. Functional ability and status Nutritional status Physical activity Advanced directives List of other physicians Hospitalizations, surgeries, and ER visits in previous 12 months Vitals Screenings to include cognitive, depression, and falls Referrals and appointments  No orders of the defined types were placed in this encounter.  In addition, I have reviewed and discussed with patient certain preventive protocols, quality metrics, and best practice recommendations. A written personalized care plan for preventive services as well as general preventive health recommendations were provided to patient.   Rojelio LELON Blush, LPN   09/23/7971   Return in 53 weeks (on 02/23/2025).  After Visit Summary: (In Person-Declined) Patient declined AVS at this time.  Nurse Notes: No voiced or noted concerns at this time "

## 2024-02-18 NOTE — Patient Instructions (Addendum)
 Ms. Marcy,  Thank you for taking the time for your Medicare Wellness Visit. I appreciate your continued commitment to your health goals. Please review the care plan we discussed, and feel free to reach out if I can assist you further.  Please note that Annual Wellness Visits do not include a physical exam. Some assessments may be limited, especially if the visit was conducted virtually. If needed, we may recommend an in-person follow-up with your provider.  Ongoing Care Seeing your primary care provider every 3 to 6 months helps us  monitor your health and provide consistent, personalized care.   Referrals If a referral was made during today's visit and you haven't received any updates within two weeks, please contact the referred provider directly to check on the status.  Recommended Screenings:  Health Maintenance  Topic Date Due   COVID-19 Vaccine (8 - 2025-26 season) 12/16/2023   Medicare Annual Wellness Visit  02/17/2025   DTaP/Tdap/Td vaccine (4 - Td or Tdap) 10/19/2033   Pneumococcal Vaccine for age over 91  Completed   Flu Shot  Completed   Osteoporosis screening with Bone Density Scan  Completed   Hepatitis C Screening  Completed   Zoster (Shingles) Vaccine  Completed   Meningitis B Vaccine  Aged Out   Breast Cancer Screening  Discontinued   Colon Cancer Screening  Discontinued       02/18/2024    3:06 PM  Advanced Directives  Does Patient Have a Medical Advance Directive? Yes  Type of Estate Agent of [place];Living will  Does patient want to make changes to medical advance directive? No - Patient declined  Copy of Healthcare Power of Attorney in Chart? Yes - validated most recent copy scanned in chart (See row information)    Vision: Annual vision screenings are recommended for early detection of glaucoma, cataracts, and diabetic retinopathy. These exams can also reveal signs of chronic conditions such as diabetes and high blood  pressure.  Dental: Annual dental screenings help detect early signs of oral cancer, gum disease, and other conditions linked to overall health, including heart disease and diabetes.  Please see the attached documents for additional preventive care recommendations.

## 2024-02-23 ENCOUNTER — Other Ambulatory Visit: Payer: Self-pay

## 2024-02-23 ENCOUNTER — Other Ambulatory Visit (HOSPITAL_COMMUNITY): Payer: Self-pay

## 2024-02-23 ENCOUNTER — Ambulatory Visit: Admitting: Internal Medicine

## 2024-02-23 ENCOUNTER — Encounter: Payer: Self-pay | Admitting: Internal Medicine

## 2024-02-23 VITALS — BP 130/64 | Temp 97.5°F | Wt 156.9 lb

## 2024-02-23 DIAGNOSIS — G4733 Obstructive sleep apnea (adult) (pediatric): Secondary | ICD-10-CM

## 2024-02-23 DIAGNOSIS — J479 Bronchiectasis, uncomplicated: Secondary | ICD-10-CM | POA: Diagnosis not present

## 2024-02-23 DIAGNOSIS — Z0000 Encounter for general adult medical examination without abnormal findings: Secondary | ICD-10-CM

## 2024-02-23 DIAGNOSIS — E782 Mixed hyperlipidemia: Secondary | ICD-10-CM | POA: Diagnosis not present

## 2024-02-23 DIAGNOSIS — E559 Vitamin D deficiency, unspecified: Secondary | ICD-10-CM | POA: Diagnosis not present

## 2024-02-23 DIAGNOSIS — D839 Common variable immunodeficiency, unspecified: Secondary | ICD-10-CM | POA: Diagnosis not present

## 2024-02-23 DIAGNOSIS — E039 Hypothyroidism, unspecified: Secondary | ICD-10-CM

## 2024-02-23 LAB — LIPID PANEL
Cholesterol: 143 mg/dL (ref 28–200)
HDL: 44.4 mg/dL
LDL Cholesterol: 55 mg/dL (ref 10–99)
NonHDL: 98.11
Total CHOL/HDL Ratio: 3
Triglycerides: 216 mg/dL — ABNORMAL HIGH (ref 10.0–149.0)
VLDL: 43.2 mg/dL — ABNORMAL HIGH (ref 0.0–40.0)

## 2024-02-23 LAB — CBC WITH DIFFERENTIAL/PLATELET
Basophils Absolute: 0 K/uL (ref 0.0–0.1)
Basophils Relative: 0.7 % (ref 0.0–3.0)
Eosinophils Absolute: 0.1 K/uL (ref 0.0–0.7)
Eosinophils Relative: 1 % (ref 0.0–5.0)
HCT: 43 % (ref 36.0–46.0)
Hemoglobin: 14 g/dL (ref 12.0–15.0)
Lymphocytes Relative: 26.7 % (ref 12.0–46.0)
Lymphs Abs: 1.4 K/uL (ref 0.7–4.0)
MCHC: 32.5 g/dL (ref 30.0–36.0)
MCV: 96.2 fl (ref 78.0–100.0)
Monocytes Absolute: 0.6 K/uL (ref 0.1–1.0)
Monocytes Relative: 11.4 % (ref 3.0–12.0)
Neutro Abs: 3.3 K/uL (ref 1.4–7.7)
Neutrophils Relative %: 60.2 % (ref 43.0–77.0)
Platelets: 178 K/uL (ref 150.0–400.0)
RBC: 4.47 Mil/uL (ref 3.87–5.11)
RDW: 16 % — ABNORMAL HIGH (ref 11.5–15.5)
WBC: 5.4 K/uL (ref 4.0–10.5)

## 2024-02-23 LAB — COMPREHENSIVE METABOLIC PANEL WITH GFR
ALT: 17 U/L (ref 3–35)
AST: 22 U/L (ref 5–37)
Albumin: 4.2 g/dL (ref 3.5–5.2)
Alkaline Phosphatase: 100 U/L (ref 39–117)
BUN: 14 mg/dL (ref 6–23)
CO2: 31 meq/L (ref 19–32)
Calcium: 9.4 mg/dL (ref 8.4–10.5)
Chloride: 105 meq/L (ref 96–112)
Creatinine, Ser: 0.67 mg/dL (ref 0.40–1.20)
GFR: 85.11 mL/min
Glucose, Bld: 68 mg/dL — ABNORMAL LOW (ref 70–99)
Potassium: 4.1 meq/L (ref 3.5–5.1)
Sodium: 141 meq/L (ref 135–145)
Total Bilirubin: 0.4 mg/dL (ref 0.2–1.2)
Total Protein: 6.9 g/dL (ref 6.0–8.3)

## 2024-02-23 LAB — HEMOGLOBIN A1C: Hgb A1c MFr Bld: 5.6 % (ref 4.6–6.5)

## 2024-02-23 LAB — VITAMIN B12: Vitamin B-12: 489 pg/mL (ref 211–911)

## 2024-02-23 LAB — VITAMIN D 25 HYDROXY (VIT D DEFICIENCY, FRACTURES): VITD: 50.18 ng/mL (ref 30.00–100.00)

## 2024-02-23 LAB — TSH: TSH: 2.37 u[IU]/mL (ref 0.35–5.50)

## 2024-02-23 NOTE — Progress Notes (Signed)
 "    Established Patient Office Visit     CC/Reason for Visit: Annual preventive exam  HPI: Beverly [name] is a 79 y.o. female who is coming in today for the above mentioned reasons. Past Medical History is significant for: Obstructive sleep apnea, hyperlipidemia, chronic pain syndrome, asthma, GERD, CIVD, lymphocytic colitis and vitamin D  deficiency.  Also has a history of coronary artery disease and GI bleed.  She had a home visit from [facility] where it was suggested that she decrease her Lipitor dose for reasons that are not quite clear to me.  She is not having increasing muscle or joint aches.  Last LDL was 67.  Has routine eye and dental care.  Cancer screening and bone density as well as immunizations are all up-to-date.   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   right sholder   BCC (basal cell carcinoma of skin) 05/09/2014   mid lower back   BCC (basal cell carcinoma of skin) 05/03/2017   right low back   BCC (basal cell carcinoma of skin) 05/03/2017   left upper back   BCC (basal cell carcinoma of skin) 07/05/2018   left mid back   BCC (basal cell carcinoma of skin) 05/20/1992   upper back   BCC (basal cell carcinoma of skin) 07/29/1993   left sholder medial   BCC (basal cell carcinoma of skin) 07/29/1993   left sholder lateral   BCC (basal cell carcinoma of skin) 07/29/1993   right thigh   BCC (basal cell carcinoma of skin) 07/29/1993   right sholder   BCC (basal cell carcinoma of skin) 12/22/1994   right mid forearm   BCC (basal cell carcinoma of skin) 12/22/1994   right upper forearm   BCC (basal cell carcinoma of skin) 12/22/1994   lower right upper arm   BCC (basal cell  carcinoma of skin) 12/22/1994   right upper arm sholder   BCC (basal cell carcinoma of skin) 08/11/1995   left leg below knee   BCC (basal cell carcinoma of skin) 04/11/2002   mid back   BCC (basal cell carcinoma of skin) 12/10/2002   right center upper back   BCC (basal cell carcinoma of skin) 05/26/2005   right post sholder   BCC (basal cell carcinoma) 05/09/2014   left inner shin   BCC (basal cell carcinoma) 06/12/2014   left forearm   Blood transfusion without reported diagnosis 11/06/21   Bowen's disease 10/07/1994   right post knee, right inner forearm/wrist   Bowen's disease 08/11/1995   left sholder   Bronchiectasis [facility])    Cataract 2010   left   Common variable immunodeficiency [facility])    sees dr tiana ronco 02-11-2021 epic   COPD (chronic obstructive pulmonary disease) (HCC) 2022   Brochiectosis / tracheobrocheomalacia   Depression 02/17/1995   hx of   Diverticulosis of colon 1998   mild   DJD (degenerative joint disease)    Duodenal ulcer    h/o yrs ago   Dysrhythmia    GERD (gastroesophageal reflux disease) 1989   Heart murmur 1949   History of SCC (squamous cell carcinoma) of skin  Dr. Livingston   History of sinus bradycardia    HLD (hyperlipidemia)    hypertriglyceridemia   Hypertensive retinopathy of both eyes 11/18/2017   Hypothyroidism    IBS (irritable bowel syndrome)    Internal hemorrhoids 1998   Lymphocytic colitis    Mitral regurgitation    mild   Myocardial infarction [facility]) 2023   Cardiac CT 2023 - 2 cardiac stents placed   Nocardiosis    relased by infection disease dec 2022   Osteoarthritis    feet,shoulder,neck,back,hips and hands.   Pneumonia 2015   PONV (postoperative nausea and vomiting)    Rotator cuff tear, right 02/2019   infraspinatus and supraspinatus, and dislocation of long head of bicep tendon (Dr. Ernie)   SCC (squamous cell carcinoma) 11/26/2014   left hand, right hand, right deltoid mnay areas   SCC (squamous cell carcinoma)  05/03/2017   left cheek   Sleep apnea 2017   uses a mouth guard nightly   Squamous cell carcinoma in situ (SCCIS) 07/05/2018   left hand   Tracheobronchomalacia    Trigger finger    left hand ring finger   Vitamin D  deficiency    mild    Past Surgical History:  Procedure Laterality Date   ABDOMINAL EXPOSURE N/A 02/02/2022   Procedure: ABDOMINAL EXPOSURE;  Surgeon: Gretta Lonni PARAS, MD;  Location: [facility] OR;  Service: Vascular;  Laterality: N/A;   ABDOMINAL HYSTERECTOMY  1997   complete   BIOPSY  11/05/2021   Procedure: BIOPSY;  Surgeon: Eda Iha, MD;  Location: [facility] ENDOSCOPY;  Service: Gastroenterology;;   BLEPHAROPLASTY Bilateral 01/2018   BROW LIFT Bilateral 10/10/2018   Procedure: DORY LIFT;  Surgeon: Lowery Estefana RAMAN, DO;  Location: [facility];  Service: Plastics;  Laterality: Bilateral;   BUNIONECTOMY     R 12/08, L 2004 (Dr. Teresita)   CARDIAC CATHETERIZATION     CARPAL TUNNEL RELEASE  1989   bilateral   CARPAL TUNNEL RELEASE Left 03/06/2021   Procedure: Left Revision Carpal Tunnel Release with hypothenar fat pad flap;  Surgeon: Alyse Agent, MD;  Location: [facility] [place];  Service: Orthopedics;  Laterality: Left;  with local anesthesia   CATARACT EXTRACTION Bilateral Left in 08/2012, Right 04/2016   Dr.Hecker   CESAREAN SECTION     1981 and 1986   CHOLECYSTECTOMY  1992   laparoscopic   CLOSED REDUCTION NASAL FRACTURE N/A 08/29/2018   Procedure: CLOSED REDUCTION NASAL FRACTURE;  Surgeon: Lowery Estefana RAMAN, DO;  Location: [facility];  Service: Plastics;  Laterality: N/A;  1 hour, please   COLONOSCOPY  2006   COLONOSCOPY  01/2012   due again 01/2021; mild diverticulosis   CORONARY STENT INTERVENTION N/A 05/19/2021   Procedure: CORONARY STENT INTERVENTION;  Surgeon: Mady Lonni, MD;  Location: [facility] INVASIVE CV LAB;  Service: Cardiovascular;  Laterality: N/A;   CORONARY ULTRASOUND/IVUS N/A 05/19/2021    Procedure: Intravascular Ultrasound/IVUS;  Surgeon: Mady Lonni, MD;  Location: [facility] INVASIVE CV LAB;  Service: Cardiovascular;  Laterality: N/A;   COSMETIC SURGERY  2018   epidural steroid injection, back  02/2010   ESOPHAGOGASTRODUODENOSCOPY (EGD) WITH PROPOFOL  N/A 11/05/2021   Procedure: ESOPHAGOGASTRODUODENOSCOPY (EGD) WITH PROPOFOL ;  Surgeon: Eda Iha, MD;  Location: [facility] ENDOSCOPY;  Service: Gastroenterology;  Laterality: N/A;   EYE SURGERY  2017   HIP SURGERY     right bursectomy x 3   HIP SURGERY Right 2000   torn cartilage, repaired   INGUINAL HERNIA REPAIR  10/2007  bilat   JOINT REPLACEMENT  04/11/2019   Right shoulder   LEFT HEART CATH AND CORONARY ANGIOGRAPHY N/A 05/19/2021   Procedure: LEFT HEART CATH AND CORONARY ANGIOGRAPHY;  Surgeon: Mady Bruckner, MD;  Location: [facility] INVASIVE CV LAB;  Service: Cardiovascular;  Laterality: N/A;   LEFT HEART CATH AND CORONARY ANGIOGRAPHY N/A 10/12/2023   Procedure: LEFT HEART CATH AND CORONARY ANGIOGRAPHY;  Surgeon: Jordan, [name] M, MD;  Location: [facility] INVASIVE CV LAB;  Service: Cardiovascular;  Laterality: N/A;   LUMBAR FUSION  01/2022   NECK SURGERY  1989   c6-7 cervical laminectomy and diskecotmy   REVERSE SHOULDER ARTHROPLASTY Right 04/11/2019   Procedure: REVERSE SHOULDER ARTHROPLASTY;  Surgeon: Sharl Selinda Dover, MD;  Location: [facility] OR;  Service: Orthopedics;  Laterality: Right;  Regional Block   SHOULDER SURGERY Left 03/2003   left rotator cuff repair   SHOULDER SURGERY Left 03/14/2018   rotator cuff repair; Dr. Duwayne   SPINE SURGERY  1989   TONSILLECTOMY  age 63   TOTAL KNEE ARTHROPLASTY Right 03/12/2023   Procedure: TOTAL KNEE ARTHROPLASTY;  Surgeon: Kay Kemps, MD;  Location: [facility] ORS;  Service: Orthopedics;  Laterality: Right;   TROCHANTERIC BURSA EXCISION Right 1978   TUBAL LIGATION  1986i   UPPER GASTROINTESTINAL ENDOSCOPY     WISDOM TOOTH EXTRACTION     as teenager    Social History:  reports that she  has never smoked. She has been exposed to tobacco smoke. She has never used smokeless tobacco. She reports current alcohol use of about 3.0 standard drinks of alcohol per week. She reports that she does not use drugs.  Allergies: Allergies[1]  Family History:  Family History  Problem Relation Age of Onset   Depression Mother    Schizophrenia Mother    Arthritis Mother    Cancer Father        oral   Heart disease Father        bradycardia   Esophageal cancer Father    Cancer Sister        skin and lung   COPD Sister    Hypertension Sister    Osteoporosis Sister        compression fx's x 3 11/2018   Depression Sister    Miscarriages / Stillbirths Sister    Hyperthyroidism Brother    Cancer Brother        metastatic cancer to bone (?primary)   Drug abuse Brother    Learning disabilities Brother    Diabetes Maternal Grandmother    Hearing loss Maternal Grandfather    Cancer Paternal Grandmother 14       colon cancer, metastatic to liver   Colon cancer Paternal Grandmother    Liver cancer Paternal Grandmother    Hyperlipidemia Daughter    Asthma Son    ADD / ADHD Other     Current Medications[2]  Review of Systems:  Negative unless indicated in HPI.   Physical Exam: Vitals:   02/23/24 1026  BP: 130/64  Temp: (!) 97.5 F (36.4 C)  TempSrc: Oral  Weight: 156 lb 14.4 oz (71.2 kg)    Body mass index is 26.52 kg/m.   Physical Exam Vitals reviewed.  Constitutional:      General: She is not in acute distress.    Appearance: Normal appearance. She is not ill-appearing, toxic-appearing or diaphoretic.  HENT:     Head: Normocephalic.     Right Ear: Tympanic membrane, ear canal and external ear normal. There is no impacted cerumen.  Left Ear: Tympanic membrane, ear canal and external ear normal. There is no impacted cerumen.     Nose: Nose normal.     Mouth/Throat:     Mouth: Mucous membranes are moist.     Pharynx: Oropharynx is clear. No oropharyngeal  exudate or posterior oropharyngeal erythema.  Eyes:     General: No scleral icterus.       Right eye: No discharge.        Left eye: No discharge.     Conjunctiva/sclera: Conjunctivae normal.  Neck:     Vascular: No carotid bruit.  Cardiovascular:     Rate and Rhythm: Normal rate and regular rhythm.     Pulses: Normal pulses.     Heart sounds: Normal heart sounds.  Pulmonary:     Effort: Pulmonary effort is normal. No respiratory distress.     Breath sounds: Normal breath sounds.  Abdominal:     General: Abdomen is flat. Bowel sounds are normal.     Palpations: Abdomen is soft.  Musculoskeletal:        General: Normal range of motion.     Cervical back: Normal range of motion.  Skin:    General: Skin is warm and dry.  Neurological:     General: No focal deficit present.     Mental Status: She is alert and oriented to person, place, and time. Mental status is at baseline.  Psychiatric:        Mood and Affect: Mood normal.        Behavior: Behavior normal.        Thought Content: Thought content normal.        Judgment: Judgment normal.      Impression and Plan:  Encounter for preventive health examination  Mixed hyperlipidemia -     Lipid panel; Future -     Comprehensive metabolic panel with GFR; Future -     Hemoglobin A1c; Future  Acquired hypothyroidism -     TSH; Future  CVID (common variable immunodeficiency) (HCC) -     CBC with Differential/Platelet; Future -     Vitamin B12; Future  Bronchiectasis without complication (HCC)  Vitamin D  deficiency -     VITAMIN D  25 Hydroxy (Vit-D Deficiency, Fractures); Future  OSA (obstructive sleep apnea)   -Recommend routine eye and dental care. -Healthy lifestyle discussed in detail. -Labs to be updated today. -Prostate cancer screening: Not applicable Health Maintenance  Topic Date Due   COVID-19 Vaccine (8 - 2025-26 season) 12/16/2023   Medicare Annual Wellness Visit  02/17/2025   DTaP/Tdap/Td vaccine (4  - Td or Tdap) 10/19/2033   Pneumococcal Vaccine for age over 56  Completed   Flu Shot  Completed   Osteoporosis screening with Bone Density Scan  Completed   Hepatitis C Screening  Completed   Zoster (Shingles) Vaccine  Completed   Meningitis B Vaccine  Aged Out   Breast Cancer Screening  Discontinued   Colon Cancer Screening  Discontinued        Travaughn Vue Theophilus Andrews, MD [facility]     [1]  Allergies Allergen Reactions   Other Other (See Comments)   Adhesive [Tape] Rash    Blister    Codeine Rash  [2]  Current Outpatient Medications:    acetaminophen  (TYLENOL ) 650 MG CR tablet, Take 650-1,300 mg by mouth every 8 (eight) hours as needed for pain., Disp: , Rfl:    albuterol  (VENTOLIN  HFA) 108 (90 Base) MCG/ACT inhaler, Inhale 2  puffs into the lungs every 4 (four) hours as needed for wheezing or shortness of breath., Disp: 6.7 g, Rfl: 1   ARTIFICIAL TEAR OP, Place 2 drops into both eyes in the morning and at bedtime., Disp: , Rfl:    atorvastatin  (LIPITOR) 40 MG tablet, Take 1 tablet (40 mg total) by mouth daily., Disp: 90 tablet, Rfl: 0   Buprenorphine  HCl (BELBUCA ) 750 MCG FILM, Place 750 mcg (1 film) inside cheek 2 (two) times daily., Disp: 60 each, Rfl: 2   Buprenorphine  HCl (BELBUCA ) 750 MCG FILM, Place 1 film (750 mcg) inside cheek 2 (two) times daily., Disp: 60 each, Rfl: 2   Buprenorphine  HCl (BELBUCA ) 750 MCG FILM, Place 750 mcg inside cheek 2 (two) times daily., Disp: 60 Film, Rfl: 2   Calcium  Carbonate-Vitamin D  600-200 MG-UNIT TABS, Take 1 tablet by mouth 2 (two) times daily., Disp: , Rfl:    Cholecalciferol  (VITAMIN D -3 PO), Take 1 tablet by mouth daily., Disp: , Rfl:    clopidogrel  (PLAVIX ) 75 MG tablet, Take 1 tablet (75 mg total) by mouth daily., Disp: 90 tablet, Rfl: 3   diclofenac  Sodium (VOLTAREN ) 1 % GEL, Apply 2-4 g topically 4 (four) times daily as needed., Disp: 100 g, Rfl: 4   dicyclomine  (BENTYL ) 10 MG capsule, Take 1 capsule  (10 mg total) by mouth 2 (two) times daily as needed for spasms., Disp: 60 capsule, Rfl: 3   EPINEPHrine  0.3 mg/0.3 mL IJ SOAJ injection, Inject 0.3 mg into the muscle as needed for anaphylaxis., Disp: , Rfl:    famotidine  (PEPCID ) 40 MG tablet, Take 1 tablet (40 mg total) by mouth at bedtime., Disp: 90 tablet, Rfl: 1   fluorouracil  (EFUDEX ) 5 % cream, Apply two times a day to areas on hands for 2 weeks., Disp: 40 g, Rfl: 0   guaiFENesin  (MUCINEX ) 600 MG 12 hr tablet, Take 600 mg by mouth 2 (two) times daily., Disp: , Rfl:    HYDROcodone -acetaminophen  (NORCO) 7.5-325 MG tablet, Take 1 tablet by mouth 4 (four) times daily as needed for pain, Disp: 120 tablet, Rfl: 0   ipratropium (ATROVENT ) 0.06 % nasal spray, Place 2 sprays into both nostrils 4 (four) times daily as needed for rhinitis., Disp: 45 mL, Rfl: 1   isosorbide  mononitrate (IMDUR ) 30 MG 24 hr tablet, Take 1 tablet (30 mg total) by mouth daily., Disp: 90 tablet, Rfl: 3   Lactobacillus-Inulin (CULTURELLE ADULT ULT BALANCE) CAPS, Take 1 capsule by mouth daily., Disp: , Rfl:    levocetirizine (XYZAL ) 5 MG tablet, Take 1 tablet (5 mg total) by mouth every evening., Disp: 90 tablet, Rfl: 2   levothyroxine  (SYNTHROID ) 25 MCG tablet, Take 1 tablet (25 mcg total) by mouth daily., Disp: 90 tablet, Rfl: 0   loratadine  (CLARITIN ) 10 MG tablet, Take 1 tablet (10 mg total) by mouth daily as needed for allergies (Can take an extra dose during flare ups.)., Disp: 180 tablet, Rfl: 1   melatonin 5 MG TABS, Take 5 mg by mouth at bedtime., Disp: , Rfl:    methocarbamol  (ROBAXIN ) 750 MG tablet, Take 1 tablet (750 mg total) by mouth 3 (three) times daily as needed., Disp: 270 tablet, Rfl: 2   methocarbamol  (ROBAXIN ) 750 MG tablet, Take 1 tablet (750 mg total) by mouth 3 (three) times daily as needed. Stop robaxin  500 mg, Disp: 270 tablet, Rfl: 2   montelukast  (SINGULAIR ) 10 MG tablet, Take 1 tablet (10 mg total) by mouth at bedtime., Disp: 90 tablet, Rfl: 1  Multiple Vitamin (MULTIVITAMIN ADULT PO), Take 1 tablet by mouth daily., Disp: , Rfl:    naloxegol  oxalate (MOVANTIK ) 12.5 MG TABS tablet, Take 1 tablet (12.5 mg total) by mouth daily., Disp: 90 tablet, Rfl: 3   nitroGLYCERIN  (NITROSTAT ) 0.4 MG SL tablet, Place 1 tablet (0.4 mg total) under the tongue every 5 (five) minutes as needed., Disp: 25 tablet, Rfl: 5   omega-3 acid ethyl esters (LOVAZA ) 1 g capsule, Take 2 capsules (2 g total) by mouth 2 (two) times daily., Disp: 360 capsule, Rfl: 1   pantoprazole  (PROTONIX ) 40 MG tablet, Take 1 tablet (40 mg total) by mouth 2 (two) times daily., Disp: 180 tablet, Rfl: 1   pneumococcal 21-valent conjugate vaccine (CAPVAXIVE ) 0.5 ML injection, Inject into the muscle., Disp: 0.5 mL, Rfl: 0   polyethylene glycol (MIRALAX  / GLYCOLAX ) 17 g packet, Take 17 g by mouth daily., Disp: , Rfl:    pregabalin  (LYRICA ) 50 MG capsule, Take 1 capsule (50 mg total) by mouth every 8 (eight) hours., Disp: 270 capsule, Rfl: 2   psyllium (METAMUCIL) 58.6 % packet, Take 1 packet by mouth daily., Disp: , Rfl:    TART CHERRY PO, Take 1 capsule by mouth daily., Disp: , Rfl:    Turmeric (QC TUMERIC COMPLEX PO), Take 1 capsule by mouth 2 (two) times daily., Disp: , Rfl:    valACYclovir  (VALTREX ) 500 MG tablet, Take 1 tablet (500 mg total) by mouth daily., Disp: 90 tablet, Rfl: 1   venlafaxine  XR (EFFEXOR  XR) 75 MG 24 hr capsule, Take 1 capsule (75 mg total) by mouth daily., Disp: 90 capsule, Rfl: 2   venlafaxine  XR (EFFEXOR  XR) 75 MG 24 hr capsule, Take 1 capsule (75 mg total) by mouth daily., Disp: 90 capsule, Rfl: 2   XEMBIFY  10 GM/50ML SOLN, Inject 10 g into the skin once a week. Wednesday, Disp: , Rfl:   "

## 2024-02-24 ENCOUNTER — Ambulatory Visit: Payer: Self-pay | Admitting: Internal Medicine

## 2024-02-24 ENCOUNTER — Other Ambulatory Visit: Payer: Self-pay

## 2024-02-24 ENCOUNTER — Other Ambulatory Visit (HOSPITAL_COMMUNITY): Payer: Self-pay

## 2024-02-24 ENCOUNTER — Other Ambulatory Visit: Payer: Self-pay | Admitting: Physician Assistant

## 2024-02-24 MED ORDER — CLOPIDOGREL BISULFATE 75 MG PO TABS
75.0000 mg | ORAL_TABLET | Freq: Every day | ORAL | 3 refills | Status: AC
  Filled 2024-02-24: qty 90, 90d supply, fill #0

## 2024-02-27 NOTE — Progress Notes (Unsigned)
 "  Cardiology Office Note    Date:  03/02/2024  ID:  Beverly, [name] 1945-03-23, MRN [id number] PCP:  Theophilus Andrews, Tully GRADE, MD  Cardiologist:  Maude Emmer, MD  Electrophysiologist:  None   Chief Complaint: Follow up for CAD   History of Present Illness: .   Beverly [name] is a 79 y.o. female with visit-pertinent history of coronary artery disease with DES to the LAD and ramus in 05/2021, SVT/nocturnal bradycardia, hyperlipidemia, OSA, ILD with severe tracheobronchomalacia.    Coronary CTA in 2023 was suggestive of tight LAD and ramus.  Patient went cardiac catheterization/3/23 with DES to LAD and DES to ramus.  Recommended for Brilinta  and aspirin  for at least 6 months.  Patient was admitted with acute GI bleed in 10/2021 and Brilinta  was held, she was switched to Plavix .   Patient was seen by Dr. Lavona on 10/08/2023 for chest pain.  It was noted that patient did not truly have chest pain prior to her previous stenting.  As noted she lives alone and was doing some mild yard work and was taking care of her own activities.  Patient developed midsternal chest discomfort that was not like her previous reflux and was a 6 out of 10 in intensity.  Patient reported some associated diaphoresis at rest.  Patient had recurrent episode in which she had jaw discomfort as well, with some improvement after 1-2 nitroglycerin  and then resolution with the second.  Patient was set up for cardiac catheterization.  Cardiac catheterization on 10/12/2023 indicated nonobstructive CAD with continued patency of LAD and ramus stents.  Patient with mid LAD to distal LAD lesion 30% stenosed, mid Cx lesion 15% stenosed, nonstenotic ostial LAD lesion was previously treated, nonstenotic proximal LAD to mid LAD lesion previously treated, LVEF 55 to 65% by visual estimate.  She was last seen in clinic on 11/05/2023 for follow-up.  Patient reported she had been doing very well overall, denied any further chest pain.   She reported she did have a significant history of GERD, questions if previous experience chest pain was related to this.  She notes that with increased dose of Imdur  she has noted significant dry mouth and difficulty coughing up secretions related ILD.  She reports that her breathing is at baseline.  She denies any palpitations, lower extremity edema, orthopnea or PND.  Patient reported that she had returned to her baseline.  Patient's Imdur  was used back to 30 mg daily.  Today she presents for follow-up.  She reports that she has been doing well overall.  She denies any chest pain, shortness of breath, orthopnea or PND.  She denies any palpitations, presyncope or syncope.  She does note some intermittent increased diaphoresis that only occurs on the right side of her scalp, has been seen by a neurologist and felt related to her history of Bell's palsy.  Patient denies any cardiac concerns or complaints today, reports that her dry mouth had improved with decreased dose of Imdur .  Labwork independently reviewed: 02/23/2024: Sodium 141, potassium 4.1, creatinine 0.67, AST 22, ALT 17, hemoglobin 14, hematocrit 43 ROS: .   Today she denies chest pain, shortness of breath, lower extremity edema, fatigue, palpitations, melena, hematuria, hemoptysis, diaphoresis, weakness, presyncope, syncope, orthopnea, and PND.  All other systems are reviewed and otherwise negative. Studies Reviewed: SABRA   EKG:  EKG is ordered today, personally reviewed, demonstrating  EKG Interpretation Date/Time:  Thursday March 02 2024 14:00:49 EST Ventricular Rate:  71 PR Interval:  212 QRS Duration:  88 QT Interval:  422 QTC Calculation: 458 R Axis:   -28  Text Interpretation: Sinus rhythm with 1st degree A-V block with Premature atrial complexes Anterior infarct (cited on or before 20-May-2021) When compared with ECG of 05-Nov-2023 11:13, Premature atrial complexes are now Present Nonspecific T wave abnormality, improved in  Anterior leads Confirmed by Rielly Corlett [phone]) on 03/02/2024 3:04:44 PM   CV Studies: Cardiac studies reviewed are outlined and summarized above. Otherwise please see EMR for full report. Cardiac Studies & Procedures   ______________________________________________________________________________________________ CARDIAC CATHETERIZATION  CARDIAC CATHETERIZATION 10/12/2023  Conclusion   Mid LAD to Dist LAD lesion is 30% stenosed.   Mid Cx lesion is 15% stenosed.   Non-stenotic Ost LAD lesion was previously treated.   Non-stenotic Prox LAD to Mid LAD lesion was previously treated.   Non-stenotic Ramus lesion was previously treated.   The left ventricular systolic function is normal.   LV end diastolic pressure is normal.   The left ventricular ejection fraction is 55-65% by visual estimate.  Nonobstructive CAD- continued excellent patency of LAD and ramus stents Normal LV function Normal LVEDP  Plan: continue medical therapy  Findings Coronary Findings Diagnostic  Dominance: Right  Left Main Vessel is large. Vessel is angiographically normal.  Left Anterior Descending Vessel is moderate in size. Non-stenotic Ost LAD lesion was previously treated. The lesion is eccentric. The lesion is calcified. Non-stenotic Prox LAD to Mid LAD lesion was previously treated. Mid LAD to Dist LAD lesion is 30% stenosed.  First Diagonal Branch Vessel is small in size.  Second Diagonal Branch Vessel is small in size.  Ramus Intermedius Vessel is moderate in size. Non-stenotic Ramus lesion was previously treated.  Left Circumflex Vessel is large. Mid Cx lesion is 15% stenosed.  First Obtuse Marginal Branch Vessel is moderate in size.  Second Obtuse Marginal Branch Vessel is small in size.  Third Obtuse Marginal Branch Vessel is moderate in size.  Fourth Obtuse Marginal Branch Vessel is small in size.  Right Coronary Artery Vessel is moderate in size. The vessel exhibits minimal  luminal irregularities.  Right Posterior Descending Artery Vessel is small in size.  Right Posterior Atrioventricular Artery Vessel is moderate in size.  First Right Posterolateral Branch Vessel is small in size.  Second Right Posterolateral Branch Vessel is small in size.  Third Right Posterolateral Branch Vessel is small in size.  Intervention  No interventions have been documented.   CARDIAC CATHETERIZATION  CARDIAC CATHETERIZATION 05/19/2021  Conclusion Conclusions: Severe two-vessel coronary artery disease with up to 90% stenoses involving the ostial through mid LAD and ostial through proximal ramus intermedius.  Mild, nonobstructive CAD noted in the LCx and RCA. Normal left ventricular systolic function (LVEF 55-65%) with normal filling pressure (LVEDP 10 mmHg) Successful IVUS-guided PCI to ostial through mid LAD using Onyx Frontier 2.75 x 38 mm drug-eluting stent with 0% residual stenosis and TIMI-3 flow Successful PCI to ostial through proximal ramus intermedius using Onyx Frontier 2.0 x 22 mm drug-eluting stent with 0% residual stenosis and TIMI-3 flow.  Recommendations: Overnight extended recovery. Dual antiplatelet therapy with aspirin  and clopidogrel , ideally for 6 months.  Nonemergent surgery will likely need to be delayed to allow for adequate antiplatelet therapy following two-vessel PCI. Aggressive secondary prevention.  [name], MD [facility]  Findings Coronary Findings Diagnostic  Dominance: Right  Left Main Vessel is large. Vessel is angiographically normal.  Left Anterior Descending Vessel is moderate in size. Ost LAD lesion is 50%  stenosed. The lesion is eccentric. The lesion is calcified. Prox LAD to Mid LAD lesion is 90% stenosed. Ultrasound (IVUS) was performed. Severe plaque burden was detected. IVUS has determined that the lesion is heterogeneous. Mid LAD to Dist LAD lesion is 30% stenosed.  First Diagonal Branch Vessel is  small in size.  Second Diagonal Branch Vessel is small in size.  Ramus Intermedius Vessel is moderate in size. Ramus lesion is 90% stenosed.  Left Circumflex Vessel is large. Mid Cx lesion is 15% stenosed.  First Obtuse Marginal Branch Vessel is moderate in size.  Second Obtuse Marginal Branch Vessel is small in size.  Third Obtuse Marginal Branch Vessel is moderate in size.  Fourth Obtuse Marginal Branch Vessel is small in size.  Right Coronary Artery Vessel is moderate in size. The vessel exhibits minimal luminal irregularities.  Right Posterior Descending Artery Vessel is small in size.  Right Posterior Atrioventricular Artery Vessel is moderate in size.  First Right Posterolateral Branch Vessel is small in size.  Second Right Posterolateral Branch Vessel is small in size.  Third Right Posterolateral Branch Vessel is small in size.  Intervention  Ost LAD lesion Stent (Also treats lesions: Prox LAD to Mid LAD) Lesion length:  36 mm. CATH VISTA GUIDE 6FR XBLAD3.5 guide catheter was inserted. Lesion crossed with guidewire using a WIRE RUNTHROUGH .[id number]. Pre-stent angioplasty was performed using a BALLN SAPPHIRE 2.5X15. Maximum pressure:  6 atm. A drug-eluting stent was successfully placed using a STENT ONYX FRONTIER [id number]. Maximum pressure: 16 atm. Stent strut is well apposed. Post-stent angioplasty was performed using a BALL SAPPHIRE NC24 3.0X22. Maximum pressure:  18 atm. Proximal edge of stent was further postdilated with an [place] 3.25 x 8 mm balloon at 18 atm. Post-Intervention Lesion Assessment The intervention was successful. Pre-interventional TIMI flow is 3. Post-intervention TIMI flow is 3. No complications occurred at this lesion. There is a 0% residual stenosis post intervention.  Prox LAD to Mid LAD lesion Stent (Also treats lesions: Ost LAD) See details in Ost LAD lesion. Post-Intervention Lesion Assessment The intervention was  successful. Pre-interventional TIMI flow is 3. Post-intervention TIMI flow is 3. No complications occurred at this lesion. There is a 0% residual stenosis post intervention.  Ramus lesion Stent Lesion length:  18 mm. CATH VISTA GUIDE 6FR XBLAD3.5 guide catheter was inserted. Lesion crossed with guidewire using a WIRE HI TORQ BMW 190CM. Pre-stent angioplasty was performed using a BALLN SAPPHIRE 2.0X12. Maximum pressure:  6 atm. A drug-elutingstent was successfully placed using a STENT ONYX FRONTIER 2.0X22. Maximum pressure: 16 atm. Stent strut is well apposed. Post-stent angioplasty was performed using a BALLN SAPPHIRE [place] 2.25X15. Maximum pressure:  16 atm. Post-Intervention Lesion Assessment The intervention was successful. Pre-interventional TIMI flow is 3. Post-intervention TIMI flow is 3. No complications occurred at this lesion. There is a 0% residual stenosis post intervention.          CT SCANS  CT CORONARY FRACTIONAL FLOW RESERVE DATA PREP 05/16/2021  Narrative EXAM: CT FFR ANALYSIS  CLINICAL DATA:  abnormal coronary CT  FINDINGS: FFRct analysis was performed on the original cardiac CT angiogram dataset. Diagrammatic representation of the FFRct analysis is provided in a separate PDF document in PACS. This dictation was created using the PDF document and an interactive 3D model of the results. 3D model is not available in the EMR/PACS. Normal FFR range is >0.80. Indeterminate (grey) zone is 0.76-0.80.  1. Left Main: FFR = 0.99  2. LAD: Proximal FFR = 0.82,  mid FFR = 0.70, distal FFR = 0.66 3. Ramus intermedius: Proximal FFR = 0.68, mid FFR = 0.68 4. LCX: Proximal FFR = 0.97, distal FFR = 0.86 5. RCA: Proximal FFR = 0.93, mid FFR =0.85, Distal FFR = 0.80  IMPRESSION: 1. CT FFR analysis showed significant stenosis in the mid LAD and proximal ramus intermedius.  RECOMMENDATIONS: Consider cardiac catheterization.  Guideline-directed medical therapy and aggressive risk  factor modification for secondary prevention of coronary artery disease.   Electronically Signed By: Soyla Merck M.D. On: 05/16/2021 17:09   CT SCANS  CT CORONARY MORPH W/CTA COR W/SCORE 05/16/2021  Addendum 05/16/2021  5:05 PM ADDENDUM REPORT: 05/16/2021 17:03  HISTORY: Pre-op noncardiac surgery, moderate risk  EXAM: Cardiac/Coronary  CT  TECHNIQUE: The patient was scanned on a [employer].  PROTOCOL: A 120 kV prospective scan was triggered in the descending thoracic aorta at 111 HU's. Axial non-contrast 3 mm slices were carried out through the heart. The data set was analyzed on a dedicated work station and scored using the Agatston method. Gantry rotation speed was 250 msecs and collimation was .6 mm. Beta blockade and 0.8 mg of sl NTG was given. The 3D data set was reconstructed in 5% intervals of the 35-75 % of the R-R cycle. Systolic and diastolic phases were analyzed on a dedicated work station using MPR, MIP and VRT modes. The patient received 95mL OMNIPAQUE  IOHEXOL  350 MG/ML SOLN contrast.  FINDINGS: Image quality: Good  Noise artifact is: Moderate misregistration due to respiratory motion artifact.  Coronary calcium  score is 31, which places the patient in the 49th percentile for age and sex matched control.  Coronary arteries: Normal coronary origins.  Right dominance.  Right Coronary Artery: Mild atherosclerotic plaque in the proximal RCA, 25-49% stenosis. Moderate mixed atherosclerotic plaque in the mid RCA, 50-69% stenosis.  Left Main Coronary Artery: No detectable plaque or stenosis.  Left Anterior Descending Coronary Artery: Severe, tubular mixed atherosclerotic plaque in the proximal LAD tortuous segment, 70-99% stenosis. Long segment tubular stenosis extends to the mid LAD and appears at least moderate, 50-69% stenosis.  Ramus intermedius: heavily calcified proximal RI with probable severe stenosis 70-99%.  Left Circumflex  Artery: Moderate atherosclerotic plaque in the distal L circumflex, 50-69% stenosis.  Aorta: Normal size, 29 mm at the mid ascending aorta (level of the PA bifurcation) measured double oblique. No calcifications. No dissection.  Aortic Valve: No calcifications.  Other findings:  Normal pulmonary vein drainage into the left atrium.  Normal left atrial appendage without thrombus.  Normal size of the pulmonary artery.  IMPRESSION: 1. Severe CAD in proximal LAD and proximal ramus intermedius, CADRADS = 4. CT FFR will be performed and reported separately.  2. Coronary calcium  score is 31, which places the patient in the 49th percentile for age and sex matched control.  3. Normal coronary origins with right dominance.   Electronically Signed By: Soyla Merck M.D. On: 05/16/2021 17:03  Narrative EXAM: OVER-READ INTERPRETATION  CT CHEST  The following report is an over-read performed by radiologist Dr. Reyes Holder of [facility] on 05/16/2021. This over-read does not include interpretation of cardiac or coronary anatomy or pathology. The coronary calcium  score/coronary CTA interpretation by the cardiologist is attached.  COMPARISON:  Jun 20, 2019.  FINDINGS: Vascular: No significant noncardiac vascular findings.  Mediastinum/Nodes: No mediastinal or hilar adenopathy in the acquired field-of-view. Visualized portions of the esophagus are unremarkable.  Lungs/Pleura: No suspicious pulmonary nodules or masses and no pleural effusion  or pneumothorax in the acquired field-of-view.  Upper Abdomen: No acute abnormality.  Musculoskeletal: Multilevel degenerative changes spine. No acute osseous abnormality.  IMPRESSION: No acute or new significant noncardiac finding.  Electronically Signed: By: Reyes Holder M.D. On: 05/16/2021 12:44     ______________________________________________________________________________________________       Current  Reported Medications:.    Active Medications[1]  Physical Exam:    VS:  BP 134/78   Pulse 71   Ht 5' 4.5 (1.638 m)   Wt 152 lb (68.9 kg)   SpO2 96%   BMI 25.69 kg/m    Wt Readings from Last 3 Encounters:  03/02/24 152 lb (68.9 kg)  02/23/24 156 lb 14.4 oz (71.2 kg)  02/18/24 154 lb 12.8 oz (70.2 kg)    GEN: Well nourished, well developed in no acute distress NECK: No JVD; No carotid bruits CARDIAC: RRR, no murmurs, rubs, gallops RESPIRATORY:  Clear to auscultation without rales, wheezing or rhonchi  ABDOMEN: Soft, non-tender, non-distended EXTREMITIES:  No edema; No acute deformity     Asessement and Plan:.    CAD: Patient with prior DES to LAD and DES to ramus in 2023.  Patient presented when 09/2023 with nitro and responsive chest pain.  Underwent cardiac catheterization with Dr. Jordan on 10/12/2023 that showed nonobstructive CAD and continued excellent patency of LAD and ramus stents. Stable with no anginal symptoms. No indication for ischemic evaluation.  Heart healthy diet and regular cardiovascular exercise encouraged. Reviewed ED precautions. Continue Plavix  75 mg daily, Lipitor 40 mg daily, Imdur  30 mg daily, Lovaza  2 mg twice daily.  Dyslipidemia: Last lipid profile on 02/23/2024 indicated total cholesterol 143, HDL 44, triglycerides 216 and LDL 55.  Continue atorvastatin  40 mg daily.   SVT/nocturnal bradycardia: Patient denies any palpitations or feeling of tachypalpitations.  ILD: Followed by pulmonology.  She reports that her breathing has been stable.  OSA: Patient reports history of OSA, uses mouthpiece and tolerates well.   Disposition: F/u with Dr. Nishan in six months or sooner if needed.   Signed, Theseus Birnie D Cordon Gassett, NP       [1]  Current Meds  Medication Sig   acetaminophen  (TYLENOL ) 650 MG CR tablet Take 650-1,300 mg by mouth every 8 (eight) hours as needed for pain.   albuterol  (VENTOLIN  HFA) 108 (90 Base) MCG/ACT inhaler Inhale 2 puffs into the lungs  every 4 (four) hours as needed for wheezing or shortness of breath.   ARTIFICIAL TEAR OP Place 2 drops into both eyes in the morning and at bedtime.   atorvastatin  (LIPITOR) 40 MG tablet Take 1 tablet (40 mg total) by mouth daily.   Buprenorphine  HCl (BELBUCA ) 750 MCG FILM Place 750 mcg (1 film) inside cheek 2 (two) times daily.   Buprenorphine  HCl (BELBUCA ) 750 MCG FILM Place 1 film (750 mcg) inside cheek 2 (two) times daily.   Buprenorphine  HCl (BELBUCA ) 750 MCG FILM Place 750 mcg inside cheek 2 (two) times daily.   Calcium  Carbonate-Vitamin D  600-200 MG-UNIT TABS Take 1 tablet by mouth 2 (two) times daily.   Cholecalciferol  (VITAMIN D -3 PO) Take 1 tablet by mouth daily.   clopidogrel  (PLAVIX ) 75 MG tablet Take 1 tablet (75 mg total) by mouth daily.   diclofenac  Sodium (VOLTAREN ) 1 % GEL Apply 2-4 g topically 4 (four) times daily as needed.   dicyclomine  (BENTYL ) 10 MG capsule Take 1 capsule (10 mg total) by mouth 2 (two) times daily as needed for spasms.   EPINEPHrine  0.3 mg/0.3 mL IJ SOAJ  injection Inject 0.3 mg into the muscle as needed for anaphylaxis.   famotidine  (PEPCID ) 40 MG tablet Take 1 tablet (40 mg total) by mouth at bedtime.   fluorouracil  (EFUDEX ) 5 % cream Apply two times a day to areas on hands for 2 weeks.   guaiFENesin  (MUCINEX ) 600 MG 12 hr tablet Take 600 mg by mouth 2 (two) times daily.   HYDROcodone -acetaminophen  (NORCO) 7.5-325 MG tablet Take 1 tablet by mouth 4 (four) times daily as needed for pain   ipratropium (ATROVENT ) 0.06 % nasal spray Place 2 sprays into both nostrils 4 (four) times daily as needed for rhinitis.   isosorbide  mononitrate (IMDUR ) 30 MG 24 hr tablet Take 1 tablet (30 mg total) by mouth daily.   Lactobacillus-Inulin (CULTURELLE ADULT ULT BALANCE) CAPS Take 1 capsule by mouth daily.   levocetirizine (XYZAL ) 5 MG tablet Take 1 tablet (5 mg total) by mouth every evening.   levothyroxine  (SYNTHROID ) 25 MCG tablet Take 1 tablet (25 mcg total) by mouth  daily.   loratadine  (CLARITIN ) 10 MG tablet Take 1 tablet (10 mg total) by mouth daily as needed for allergies (Can take an extra dose during flare ups.).   melatonin 5 MG TABS Take 5 mg by mouth at bedtime.   methocarbamol  (ROBAXIN ) 750 MG tablet Take 1 tablet (750 mg total) by mouth 3 (three) times daily as needed.   methocarbamol  (ROBAXIN ) 750 MG tablet Take 1 tablet (750 mg total) by mouth 3 (three) times daily as needed. Stop robaxin  500 mg   montelukast  (SINGULAIR ) 10 MG tablet Take 1 tablet (10 mg total) by mouth at bedtime.   Multiple Vitamin (MULTIVITAMIN ADULT PO) Take 1 tablet by mouth daily.   naloxegol  oxalate (MOVANTIK ) 12.5 MG TABS tablet Take 1 tablet (12.5 mg total) by mouth daily.   nitroGLYCERIN  (NITROSTAT ) 0.4 MG SL tablet Place 1 tablet (0.4 mg total) under the tongue every 5 (five) minutes as needed.   omega-3 acid ethyl esters (LOVAZA ) 1 g capsule Take 2 capsules (2 g total) by mouth 2 (two) times daily.   pantoprazole  (PROTONIX ) 40 MG tablet Take 1 tablet (40 mg total) by mouth 2 (two) times daily.   pneumococcal 21-valent conjugate vaccine (CAPVAXIVE ) 0.5 ML injection Inject into the muscle.   polyethylene glycol (MIRALAX  / GLYCOLAX ) 17 g packet Take 17 g by mouth daily.   pregabalin  (LYRICA ) 50 MG capsule Take 1 capsule (50 mg total) by mouth every 8 (eight) hours.   psyllium (METAMUCIL) 58.6 % packet Take 1 packet by mouth daily.   TART CHERRY PO Take 1 capsule by mouth daily.   Turmeric (QC TUMERIC COMPLEX PO) Take 1 capsule by mouth 2 (two) times daily.   valACYclovir  (VALTREX ) 500 MG tablet Take 1 tablet (500 mg total) by mouth daily.   venlafaxine  XR (EFFEXOR  XR) 75 MG 24 hr capsule Take 1 capsule (75 mg total) by mouth daily.   venlafaxine  XR (EFFEXOR  XR) 75 MG 24 hr capsule Take 1 capsule (75 mg total) by mouth daily.   XEMBIFY  10 GM/50ML SOLN Inject 10 g into the skin once a week. Wednesday   "

## 2024-02-28 ENCOUNTER — Other Ambulatory Visit: Payer: Self-pay

## 2024-03-02 ENCOUNTER — Ambulatory Visit: Attending: Cardiology | Admitting: Cardiology

## 2024-03-02 ENCOUNTER — Encounter: Payer: Self-pay | Admitting: Cardiology

## 2024-03-02 VITALS — BP 134/78 | HR 71 | Ht 64.5 in | Wt 152.0 lb

## 2024-03-02 DIAGNOSIS — I471 Supraventricular tachycardia, unspecified: Secondary | ICD-10-CM | POA: Diagnosis not present

## 2024-03-02 DIAGNOSIS — I2510 Atherosclerotic heart disease of native coronary artery without angina pectoris: Secondary | ICD-10-CM | POA: Diagnosis not present

## 2024-03-02 DIAGNOSIS — E785 Hyperlipidemia, unspecified: Secondary | ICD-10-CM

## 2024-03-02 DIAGNOSIS — G4733 Obstructive sleep apnea (adult) (pediatric): Secondary | ICD-10-CM

## 2024-03-02 NOTE — Patient Instructions (Signed)
 Medication Instructions:   Your physician recommends that you continue on your current medications as directed. Please refer to the Current Medication list given to you today.   *If you need a refill on your cardiac medications before your next appointment, please call your pharmacy*  Lab Work: NONE ORDERED  TODAY   If you have labs (blood work) drawn today and your tests are completely normal, you will receive your results only by: MyChart Message (if you have MyChart) OR A paper copy in the mail If you have any lab test that is abnormal or we need to change your treatment, we will call you to review the results.  Testing/Procedures:  NONE ORDERED  TODAY     Follow-Up: At [facility], you and your health needs are our priority.  As part of our continuing mission to provide you with exceptional heart care, our providers are all part of one team.  This team includes your primary Cardiologist (physician) and Advanced Practice Providers or APPs (Physician Assistants and Nurse Practitioners) who all work together to provide you with the care you need, when you need it.  Your next appointment:   IN 6 MONTHS      We recommend signing up for the patient portal called MyChart.  Sign up information is provided on this After Visit Summary.  MyChart is used to connect with patients for Virtual Visits (Telemedicine).  Patients are able to view lab/test results, encounter notes, upcoming appointments, etc.  Non-urgent messages can be sent to your provider as well.   To learn more about what you can do with MyChart, go to [email or web address].    Other Instructions

## 2024-03-03 ENCOUNTER — Other Ambulatory Visit (HOSPITAL_COMMUNITY): Payer: Self-pay

## 2024-03-03 ENCOUNTER — Other Ambulatory Visit: Payer: Self-pay

## 2024-03-07 ENCOUNTER — Other Ambulatory Visit: Payer: Self-pay | Admitting: *Deleted

## 2024-03-07 ENCOUNTER — Encounter: Payer: Self-pay | Admitting: Allergy and Immunology

## 2024-03-07 ENCOUNTER — Other Ambulatory Visit: Payer: Self-pay

## 2024-03-07 ENCOUNTER — Other Ambulatory Visit (HOSPITAL_COMMUNITY): Payer: Self-pay

## 2024-03-07 MED ORDER — AZITHROMYCIN 500 MG PO TABS
500.0000 mg | ORAL_TABLET | Freq: Every day | ORAL | 0 refills | Status: AC
  Filled 2024-03-07 (×2): qty 3, 3d supply, fill #0

## 2024-03-07 MED ORDER — PREDNISONE 10 MG PO TABS
10.0000 mg | ORAL_TABLET | Freq: Every day | ORAL | 0 refills | Status: AC
  Filled 2024-03-07 (×2): qty 3, 3d supply, fill #0

## 2024-03-08 ENCOUNTER — Other Ambulatory Visit (HOSPITAL_COMMUNITY): Payer: Self-pay

## 2024-03-08 MED ORDER — ESTRADIOL 0.01 % VA CREA
TOPICAL_CREAM | VAGINAL | 3 refills | Status: AC
  Filled 2024-03-08: qty 42.5, 90d supply, fill #0

## 2024-03-24 ENCOUNTER — Other Ambulatory Visit (HOSPITAL_COMMUNITY): Payer: Self-pay

## 2024-05-02 ENCOUNTER — Ambulatory Visit: Admitting: Allergy and Immunology
